# Patient Record
Sex: Female | Born: 1935 | Race: White | Hispanic: No | Smoking: Never smoker
Health system: Southern US, Community
[De-identification: ages and names within clinical notes are randomized; demographics above are authoritative.]

## PROBLEM LIST (undated history)

## (undated) DIAGNOSIS — M542 Cervicalgia: Secondary | ICD-10-CM

## (undated) DIAGNOSIS — G459 Transient cerebral ischemic attack, unspecified: Secondary | ICD-10-CM

## (undated) DIAGNOSIS — Z9889 Other specified postprocedural states: Secondary | ICD-10-CM

## (undated) DIAGNOSIS — I674 Hypertensive encephalopathy: Secondary | ICD-10-CM

## (undated) DIAGNOSIS — M1990 Unspecified osteoarthritis, unspecified site: Secondary | ICD-10-CM

## (undated) DIAGNOSIS — I609 Nontraumatic subarachnoid hemorrhage, unspecified: Secondary | ICD-10-CM

## (undated) DIAGNOSIS — I219 Acute myocardial infarction, unspecified: Secondary | ICD-10-CM

## (undated) DIAGNOSIS — I639 Cerebral infarction, unspecified: Secondary | ICD-10-CM

## (undated) DIAGNOSIS — K219 Gastro-esophageal reflux disease without esophagitis: Secondary | ICD-10-CM

## (undated) DIAGNOSIS — R269 Unspecified abnormalities of gait and mobility: Secondary | ICD-10-CM

## (undated) DIAGNOSIS — G8929 Other chronic pain: Secondary | ICD-10-CM

## (undated) DIAGNOSIS — F419 Anxiety disorder, unspecified: Secondary | ICD-10-CM

## (undated) DIAGNOSIS — Z9289 Personal history of other medical treatment: Secondary | ICD-10-CM

## (undated) DIAGNOSIS — M8580 Other specified disorders of bone density and structure, unspecified site: Secondary | ICD-10-CM

## (undated) DIAGNOSIS — E785 Hyperlipidemia, unspecified: Secondary | ICD-10-CM

## (undated) DIAGNOSIS — F329 Major depressive disorder, single episode, unspecified: Secondary | ICD-10-CM

## (undated) DIAGNOSIS — I5181 Takotsubo syndrome: Secondary | ICD-10-CM

## (undated) DIAGNOSIS — I38 Endocarditis, valve unspecified: Secondary | ICD-10-CM

## (undated) DIAGNOSIS — M5430 Sciatica, unspecified side: Secondary | ICD-10-CM

## (undated) DIAGNOSIS — I10 Essential (primary) hypertension: Secondary | ICD-10-CM

## (undated) HISTORY — DX: Takotsubo syndrome: I51.81

## (undated) HISTORY — DX: Hyperlipidemia, unspecified: E78.5

## (undated) HISTORY — PX: CARDIAC CATHETERIZATION: SHX172

## (undated) HISTORY — DX: Major depressive disorder, single episode, unspecified: F32.9

## (undated) HISTORY — PX: FRACTURE SURGERY: SHX138

## (undated) HISTORY — PX: APPENDECTOMY: SHX54

## (undated) HISTORY — DX: Gastro-esophageal reflux disease without esophagitis: K21.9

## (undated) HISTORY — DX: Other specified disorders of bone density and structure, unspecified site: M85.80

## (undated) HISTORY — DX: Essential (primary) hypertension: I10

## (undated) HISTORY — DX: Personal history of other medical treatment: Z92.89

## (undated) HISTORY — DX: Other specified postprocedural states: Z98.890

## (undated) HISTORY — PX: TONSILLECTOMY: SUR1361

## (undated) HISTORY — DX: Cerebral infarction, unspecified: I63.9

## (undated) HISTORY — DX: Hypertensive encephalopathy: I67.4

## (undated) HISTORY — DX: Nontraumatic subarachnoid hemorrhage, unspecified: I60.9

---

## 1975-01-20 HISTORY — PX: ABDOMINAL HYSTERECTOMY: SHX81

## 1998-01-19 HISTORY — PX: SPINAL FUSION: SHX223

## 1998-06-28 ENCOUNTER — Encounter: Payer: Self-pay | Admitting: Neurosurgery

## 1998-07-02 ENCOUNTER — Inpatient Hospital Stay (HOSPITAL_COMMUNITY): Admission: RE | Admit: 1998-07-02 | Discharge: 1998-07-03 | Payer: Self-pay | Admitting: Neurosurgery

## 1998-07-02 ENCOUNTER — Encounter: Payer: Self-pay | Admitting: Neurosurgery

## 1999-01-27 ENCOUNTER — Encounter: Admission: RE | Admit: 1999-01-27 | Discharge: 1999-01-27 | Payer: Self-pay | Admitting: Neurosurgery

## 1999-01-27 ENCOUNTER — Encounter: Payer: Self-pay | Admitting: Neurosurgery

## 1999-07-24 ENCOUNTER — Ambulatory Visit (HOSPITAL_COMMUNITY): Admission: RE | Admit: 1999-07-24 | Discharge: 1999-07-24 | Payer: Self-pay | Admitting: Neurosurgery

## 1999-07-24 ENCOUNTER — Encounter: Payer: Self-pay | Admitting: Neurosurgery

## 1999-12-18 ENCOUNTER — Other Ambulatory Visit: Admission: RE | Admit: 1999-12-18 | Discharge: 1999-12-18 | Payer: Self-pay | Admitting: Family Medicine

## 2000-01-07 ENCOUNTER — Encounter: Admission: RE | Admit: 2000-01-07 | Discharge: 2000-04-06 | Payer: Self-pay | Admitting: Internal Medicine

## 2002-09-07 ENCOUNTER — Encounter: Payer: Self-pay | Admitting: Internal Medicine

## 2002-09-07 ENCOUNTER — Ambulatory Visit (HOSPITAL_COMMUNITY): Admission: RE | Admit: 2002-09-07 | Discharge: 2002-09-07 | Payer: Self-pay | Admitting: Internal Medicine

## 2003-05-19 ENCOUNTER — Emergency Department (HOSPITAL_COMMUNITY): Admission: EM | Admit: 2003-05-19 | Discharge: 2003-05-19 | Payer: Self-pay | Admitting: Family Medicine

## 2004-03-07 ENCOUNTER — Ambulatory Visit: Payer: Self-pay | Admitting: Internal Medicine

## 2004-04-04 ENCOUNTER — Ambulatory Visit: Payer: Self-pay | Admitting: Internal Medicine

## 2004-05-30 ENCOUNTER — Ambulatory Visit: Payer: Self-pay | Admitting: Family Medicine

## 2004-07-29 ENCOUNTER — Ambulatory Visit: Payer: Self-pay | Admitting: Internal Medicine

## 2004-07-30 ENCOUNTER — Encounter: Admission: RE | Admit: 2004-07-30 | Discharge: 2004-07-30 | Payer: Self-pay | Admitting: Internal Medicine

## 2004-09-11 ENCOUNTER — Ambulatory Visit: Payer: Self-pay | Admitting: Internal Medicine

## 2004-10-20 ENCOUNTER — Ambulatory Visit: Payer: Self-pay | Admitting: Internal Medicine

## 2004-12-04 ENCOUNTER — Ambulatory Visit: Payer: Self-pay | Admitting: Internal Medicine

## 2005-02-16 ENCOUNTER — Ambulatory Visit: Payer: Self-pay | Admitting: Internal Medicine

## 2005-03-13 ENCOUNTER — Ambulatory Visit: Payer: Self-pay | Admitting: Internal Medicine

## 2005-04-08 ENCOUNTER — Ambulatory Visit: Payer: Self-pay | Admitting: Cardiology

## 2005-04-08 ENCOUNTER — Inpatient Hospital Stay (HOSPITAL_COMMUNITY): Admission: EM | Admit: 2005-04-08 | Discharge: 2005-04-12 | Payer: Self-pay | Admitting: Emergency Medicine

## 2005-04-22 ENCOUNTER — Ambulatory Visit: Payer: Self-pay | Admitting: Cardiology

## 2005-05-11 ENCOUNTER — Ambulatory Visit: Payer: Self-pay

## 2005-05-11 ENCOUNTER — Encounter: Payer: Self-pay | Admitting: Cardiology

## 2005-05-28 ENCOUNTER — Ambulatory Visit: Payer: Self-pay | Admitting: Cardiology

## 2005-06-17 ENCOUNTER — Ambulatory Visit: Payer: Self-pay | Admitting: Internal Medicine

## 2005-11-14 ENCOUNTER — Ambulatory Visit: Payer: Self-pay | Admitting: Internal Medicine

## 2005-11-17 ENCOUNTER — Ambulatory Visit: Payer: Self-pay | Admitting: Internal Medicine

## 2005-11-17 LAB — CONVERTED CEMR LAB
AST: 43 units/L — ABNORMAL HIGH (ref 0–37)
Hgb A1c MFr Bld: 10.2 % — ABNORMAL HIGH (ref 4.6–6.0)

## 2005-12-03 ENCOUNTER — Ambulatory Visit: Payer: Self-pay | Admitting: Cardiology

## 2005-12-29 ENCOUNTER — Ambulatory Visit: Payer: Self-pay | Admitting: Internal Medicine

## 2006-02-18 ENCOUNTER — Ambulatory Visit: Payer: Self-pay | Admitting: Internal Medicine

## 2006-02-18 LAB — CONVERTED CEMR LAB
AST: 22 units/L (ref 0–37)
Creatinine, Ser: 1 mg/dL (ref 0.4–1.2)
Direct LDL: 84.3 mg/dL
HDL: 37.4 mg/dL — ABNORMAL LOW (ref >39.0)
Hgb A1c MFr Bld: 7 % — ABNORMAL HIGH (ref 4.6–6.0)
Total CHOL/HDL Ratio: 4
Triglycerides: 206 mg/dL (ref 0–149)
VLDL: 41 mg/dL — ABNORMAL HIGH (ref 0–40)

## 2006-05-12 DIAGNOSIS — J309 Allergic rhinitis, unspecified: Secondary | ICD-10-CM

## 2006-05-12 DIAGNOSIS — M5430 Sciatica, unspecified side: Secondary | ICD-10-CM

## 2006-05-12 DIAGNOSIS — F329 Major depressive disorder, single episode, unspecified: Secondary | ICD-10-CM

## 2006-05-12 DIAGNOSIS — E785 Hyperlipidemia, unspecified: Secondary | ICD-10-CM

## 2006-05-12 DIAGNOSIS — I10 Essential (primary) hypertension: Secondary | ICD-10-CM

## 2006-05-12 DIAGNOSIS — K219 Gastro-esophageal reflux disease without esophagitis: Secondary | ICD-10-CM

## 2006-05-12 DIAGNOSIS — E1169 Type 2 diabetes mellitus with other specified complication: Secondary | ICD-10-CM | POA: Insufficient documentation

## 2006-05-12 DIAGNOSIS — E119 Type 2 diabetes mellitus without complications: Secondary | ICD-10-CM

## 2006-05-12 DIAGNOSIS — F3289 Other specified depressive episodes: Secondary | ICD-10-CM | POA: Insufficient documentation

## 2006-05-12 HISTORY — DX: Type 2 diabetes mellitus without complications: E11.9

## 2006-05-12 HISTORY — DX: Gastro-esophageal reflux disease without esophagitis: K21.9

## 2006-05-12 HISTORY — DX: Allergic rhinitis, unspecified: J30.9

## 2006-05-12 HISTORY — DX: Type 2 diabetes mellitus with other specified complication: E11.69

## 2006-05-12 HISTORY — DX: Essential (primary) hypertension: I10

## 2006-05-12 HISTORY — DX: Major depressive disorder, single episode, unspecified: F32.9

## 2006-05-12 HISTORY — DX: Other specified depressive episodes: F32.89

## 2006-05-15 ENCOUNTER — Emergency Department (HOSPITAL_COMMUNITY): Admission: EM | Admit: 2006-05-15 | Discharge: 2006-05-15 | Payer: Self-pay | Admitting: Emergency Medicine

## 2006-06-10 ENCOUNTER — Ambulatory Visit: Payer: Self-pay | Admitting: Cardiology

## 2006-06-23 ENCOUNTER — Ambulatory Visit: Payer: Self-pay | Admitting: Internal Medicine

## 2006-06-23 DIAGNOSIS — I219 Acute myocardial infarction, unspecified: Secondary | ICD-10-CM | POA: Insufficient documentation

## 2006-06-23 DIAGNOSIS — M479 Spondylosis, unspecified: Secondary | ICD-10-CM

## 2006-06-23 DIAGNOSIS — M810 Age-related osteoporosis without current pathological fracture: Secondary | ICD-10-CM

## 2006-06-23 HISTORY — DX: Age-related osteoporosis without current pathological fracture: M81.0

## 2006-06-23 HISTORY — DX: Spondylosis, unspecified: M47.9

## 2006-06-30 ENCOUNTER — Ambulatory Visit: Payer: Self-pay | Admitting: Internal Medicine

## 2006-07-05 ENCOUNTER — Telehealth (INDEPENDENT_AMBULATORY_CARE_PROVIDER_SITE_OTHER): Payer: Self-pay | Admitting: *Deleted

## 2006-07-05 LAB — CONVERTED CEMR LAB
Cholesterol: 157 mg/dL (ref 0–200)
HDL: 37.5 mg/dL — ABNORMAL LOW (ref >39.0)
Total CHOL/HDL Ratio: 4.2

## 2006-07-19 ENCOUNTER — Encounter (INDEPENDENT_AMBULATORY_CARE_PROVIDER_SITE_OTHER): Payer: Self-pay | Admitting: *Deleted

## 2006-09-01 ENCOUNTER — Ambulatory Visit: Payer: Self-pay | Admitting: Internal Medicine

## 2006-12-27 ENCOUNTER — Ambulatory Visit: Payer: Self-pay | Admitting: Internal Medicine

## 2007-01-03 LAB — CONVERTED CEMR LAB
Chloride: 102 meq/L (ref 96–112)
Creatinine, Ser: 1 mg/dL (ref 0.4–1.2)
Creatinine,U: 233.6 mg/dL
GFR calc Af Amer: 70 mL/min
Hgb A1c MFr Bld: 7.3 % — ABNORMAL HIGH (ref 4.6–6.0)
Microalb, Ur: 1.2 mg/dL (ref 0.0–1.9)
Sodium: 137 meq/L (ref 135–145)
TSH: 1.71 microintl units/mL (ref 0.35–5.50)

## 2007-04-13 ENCOUNTER — Encounter: Payer: Self-pay | Admitting: Internal Medicine

## 2007-04-25 ENCOUNTER — Encounter: Payer: Self-pay | Admitting: Family Medicine

## 2007-04-27 DIAGNOSIS — E1139 Type 2 diabetes mellitus with other diabetic ophthalmic complication: Secondary | ICD-10-CM

## 2007-04-27 HISTORY — DX: Type 2 diabetes mellitus with other diabetic ophthalmic complication: E11.39

## 2007-05-02 ENCOUNTER — Telehealth (INDEPENDENT_AMBULATORY_CARE_PROVIDER_SITE_OTHER): Payer: Self-pay | Admitting: *Deleted

## 2007-05-03 ENCOUNTER — Ambulatory Visit: Payer: Self-pay | Admitting: Internal Medicine

## 2007-05-04 ENCOUNTER — Encounter (INDEPENDENT_AMBULATORY_CARE_PROVIDER_SITE_OTHER): Payer: Self-pay | Admitting: *Deleted

## 2007-05-23 ENCOUNTER — Ambulatory Visit: Payer: Self-pay | Admitting: Gastroenterology

## 2007-05-27 ENCOUNTER — Ambulatory Visit: Payer: Self-pay | Admitting: Cardiology

## 2007-06-01 ENCOUNTER — Ambulatory Visit: Payer: Self-pay | Admitting: Gastroenterology

## 2007-06-28 ENCOUNTER — Telehealth (INDEPENDENT_AMBULATORY_CARE_PROVIDER_SITE_OTHER): Payer: Self-pay | Admitting: *Deleted

## 2007-07-08 ENCOUNTER — Emergency Department (HOSPITAL_COMMUNITY): Admission: EM | Admit: 2007-07-08 | Discharge: 2007-07-08 | Payer: Self-pay | Admitting: Family Medicine

## 2007-07-21 ENCOUNTER — Ambulatory Visit: Payer: Self-pay | Admitting: Internal Medicine

## 2007-07-27 ENCOUNTER — Telehealth (INDEPENDENT_AMBULATORY_CARE_PROVIDER_SITE_OTHER): Payer: Self-pay | Admitting: *Deleted

## 2007-07-27 LAB — CONVERTED CEMR LAB
BUN: 28 mg/dL — ABNORMAL HIGH (ref 6–23)
Chloride: 102 meq/L (ref 96–112)
Cholesterol: 164 mg/dL (ref 0–200)
Creatinine, Ser: 1 mg/dL (ref 0.4–1.2)
GFR calc non Af Amer: 58 mL/min
HDL: 42.4 mg/dL (ref >39.0)
LDL Cholesterol: 97 mg/dL (ref 0–99)
Potassium: 5.1 meq/L (ref 3.5–5.1)
Triglycerides: 121 mg/dL (ref 0–149)

## 2007-10-18 ENCOUNTER — Encounter (INDEPENDENT_AMBULATORY_CARE_PROVIDER_SITE_OTHER): Payer: Self-pay | Admitting: *Deleted

## 2008-01-05 ENCOUNTER — Ambulatory Visit: Payer: Self-pay | Admitting: Internal Medicine

## 2008-01-12 ENCOUNTER — Telehealth (INDEPENDENT_AMBULATORY_CARE_PROVIDER_SITE_OTHER): Payer: Self-pay | Admitting: *Deleted

## 2008-01-12 LAB — CONVERTED CEMR LAB
CO2: 25 meq/L (ref 19–32)
Calcium: 9.4 mg/dL (ref 8.4–10.5)
Chloride: 103 meq/L (ref 96–112)
Creatinine, Ser: 0.9 mg/dL (ref 0.4–1.2)
GFR calc Af Amer: 79 mL/min
Glucose, Bld: 110 mg/dL — ABNORMAL HIGH (ref 70–99)
Potassium: 4.6 meq/L (ref 3.5–5.1)
Sodium: 138 meq/L (ref 135–145)

## 2008-04-03 ENCOUNTER — Ambulatory Visit: Payer: Self-pay | Admitting: Internal Medicine

## 2008-04-03 DIAGNOSIS — M542 Cervicalgia: Secondary | ICD-10-CM

## 2008-04-03 HISTORY — DX: Cervicalgia: M54.2

## 2008-04-05 ENCOUNTER — Telehealth: Payer: Self-pay | Admitting: Internal Medicine

## 2008-04-12 ENCOUNTER — Encounter (INDEPENDENT_AMBULATORY_CARE_PROVIDER_SITE_OTHER): Payer: Self-pay | Admitting: *Deleted

## 2008-04-18 ENCOUNTER — Encounter: Admission: RE | Admit: 2008-04-18 | Discharge: 2008-04-18 | Payer: Self-pay | Admitting: Internal Medicine

## 2008-04-24 ENCOUNTER — Telehealth (INDEPENDENT_AMBULATORY_CARE_PROVIDER_SITE_OTHER): Payer: Self-pay | Admitting: *Deleted

## 2008-04-26 ENCOUNTER — Encounter: Payer: Self-pay | Admitting: Internal Medicine

## 2008-05-04 ENCOUNTER — Telehealth: Payer: Self-pay | Admitting: Internal Medicine

## 2008-05-17 ENCOUNTER — Ambulatory Visit: Payer: Self-pay | Admitting: Internal Medicine

## 2008-05-23 ENCOUNTER — Ambulatory Visit: Payer: Self-pay | Admitting: Cardiovascular Disease

## 2008-05-23 DIAGNOSIS — I38 Endocarditis, valve unspecified: Secondary | ICD-10-CM | POA: Insufficient documentation

## 2008-06-18 ENCOUNTER — Emergency Department (HOSPITAL_COMMUNITY): Admission: EM | Admit: 2008-06-18 | Discharge: 2008-06-18 | Payer: Self-pay | Admitting: General Surgery

## 2008-06-20 ENCOUNTER — Encounter (INDEPENDENT_AMBULATORY_CARE_PROVIDER_SITE_OTHER): Payer: Self-pay | Admitting: *Deleted

## 2008-07-04 ENCOUNTER — Encounter: Admission: RE | Admit: 2008-07-04 | Discharge: 2008-07-04 | Payer: Self-pay | Admitting: Family Medicine

## 2008-09-06 ENCOUNTER — Ambulatory Visit: Payer: Self-pay | Admitting: Internal Medicine

## 2008-09-12 LAB — CONVERTED CEMR LAB
BUN: 20 mg/dL (ref 6–23)
Basophils Absolute: 0 10*3/uL (ref 0.0–0.1)
Basophils Relative: 0.3 % (ref 0.0–3.0)
CO2: 28 meq/L (ref 19–32)
Calcium: 9.2 mg/dL (ref 8.4–10.5)
Chloride: 104 meq/L (ref 96–112)
Cholesterol: 159 mg/dL (ref 0–200)
Creatinine, Ser: 0.9 mg/dL (ref 0.4–1.2)
Eosinophils Relative: 2 % (ref 0.0–5.0)
Hemoglobin: 12.9 g/dL (ref 12.0–15.0)
Hgb A1c MFr Bld: 7.4 % — ABNORMAL HIGH (ref 4.6–6.5)
Lymphs Abs: 1.7 10*3/uL (ref 0.7–4.0)
Neutro Abs: 2.4 10*3/uL (ref 1.4–7.7)
Neutrophils Relative %: 51.7 % (ref 43.0–77.0)
Platelets: 284 10*3/uL (ref 150.0–400.0)
Potassium: 4.3 meq/L (ref 3.5–5.1)
RDW: 12 % (ref 11.5–14.6)
Sodium: 139 meq/L (ref 135–145)
Triglycerides: 253 mg/dL — ABNORMAL HIGH (ref 0.0–149.0)
WBC: 4.6 10*3/uL (ref 4.5–10.5)

## 2008-09-18 ENCOUNTER — Telehealth: Payer: Self-pay | Admitting: Internal Medicine

## 2008-10-18 ENCOUNTER — Encounter: Admission: RE | Admit: 2008-10-18 | Discharge: 2008-10-18 | Payer: Self-pay | Admitting: Internal Medicine

## 2008-10-18 ENCOUNTER — Encounter: Payer: Self-pay | Admitting: Internal Medicine

## 2008-12-10 ENCOUNTER — Ambulatory Visit: Payer: Self-pay | Admitting: Cardiovascular Disease

## 2008-12-12 ENCOUNTER — Telehealth (INDEPENDENT_AMBULATORY_CARE_PROVIDER_SITE_OTHER): Payer: Self-pay | Admitting: *Deleted

## 2008-12-19 ENCOUNTER — Telehealth (INDEPENDENT_AMBULATORY_CARE_PROVIDER_SITE_OTHER): Payer: Self-pay | Admitting: *Deleted

## 2008-12-19 ENCOUNTER — Ambulatory Visit: Payer: Self-pay | Admitting: Internal Medicine

## 2008-12-26 ENCOUNTER — Ambulatory Visit: Payer: Self-pay | Admitting: Internal Medicine

## 2008-12-28 LAB — CONVERTED CEMR LAB: AST: 21 units/L (ref 0–37)

## 2009-01-15 ENCOUNTER — Telehealth: Payer: Self-pay | Admitting: Internal Medicine

## 2009-01-19 HISTORY — PX: CATARACT EXTRACTION, BILATERAL: SHX1313

## 2009-01-22 ENCOUNTER — Telehealth: Payer: Self-pay | Admitting: Internal Medicine

## 2009-01-24 ENCOUNTER — Encounter: Admission: RE | Admit: 2009-01-24 | Discharge: 2009-04-03 | Payer: Self-pay | Admitting: Orthopedic Surgery

## 2009-03-01 ENCOUNTER — Encounter: Payer: Self-pay | Admitting: Internal Medicine

## 2009-04-02 ENCOUNTER — Encounter: Payer: Self-pay | Admitting: Internal Medicine

## 2009-04-03 ENCOUNTER — Encounter (INDEPENDENT_AMBULATORY_CARE_PROVIDER_SITE_OTHER): Payer: Self-pay | Admitting: *Deleted

## 2009-05-15 ENCOUNTER — Encounter: Payer: Self-pay | Admitting: Internal Medicine

## 2009-05-15 LAB — HM DIABETES EYE EXAM: HM Diabetic Eye Exam: NORMAL

## 2009-05-21 ENCOUNTER — Ambulatory Visit: Payer: Self-pay | Admitting: Cardiovascular Disease

## 2009-06-28 ENCOUNTER — Emergency Department (HOSPITAL_COMMUNITY): Admission: EM | Admit: 2009-06-28 | Discharge: 2009-06-28 | Payer: Self-pay | Admitting: Family Medicine

## 2009-06-28 ENCOUNTER — Emergency Department (HOSPITAL_COMMUNITY): Admission: EM | Admit: 2009-06-28 | Discharge: 2009-06-28 | Payer: Self-pay | Admitting: Emergency Medicine

## 2009-07-02 ENCOUNTER — Ambulatory Visit: Payer: Self-pay | Admitting: Internal Medicine

## 2009-07-03 DIAGNOSIS — F411 Generalized anxiety disorder: Secondary | ICD-10-CM

## 2009-07-03 HISTORY — DX: Generalized anxiety disorder: F41.1

## 2009-07-04 ENCOUNTER — Encounter: Payer: Self-pay | Admitting: Internal Medicine

## 2009-07-15 ENCOUNTER — Ambulatory Visit: Payer: Self-pay | Admitting: Internal Medicine

## 2009-07-17 LAB — CONVERTED CEMR LAB: Hgb A1c MFr Bld: 6.8 % — ABNORMAL HIGH (ref 4.6–6.5)

## 2009-08-02 ENCOUNTER — Encounter: Payer: Self-pay | Admitting: Internal Medicine

## 2009-08-07 ENCOUNTER — Ambulatory Visit: Payer: Self-pay | Admitting: Internal Medicine

## 2009-08-07 DIAGNOSIS — R269 Unspecified abnormalities of gait and mobility: Secondary | ICD-10-CM

## 2009-08-07 HISTORY — DX: Unspecified abnormalities of gait and mobility: R26.9

## 2009-10-22 ENCOUNTER — Ambulatory Visit: Payer: Self-pay | Admitting: Internal Medicine

## 2009-10-31 ENCOUNTER — Ambulatory Visit: Payer: Self-pay | Admitting: Internal Medicine

## 2009-10-31 LAB — CONVERTED CEMR LAB
Glucose, Urine, Semiquant: NEGATIVE
Protein, U semiquant: NEGATIVE

## 2009-11-19 ENCOUNTER — Ambulatory Visit: Payer: Self-pay | Admitting: Internal Medicine

## 2009-11-19 LAB — CONVERTED CEMR LAB
ALT: 19 units/L (ref 0–35)
AST: 22 units/L (ref 0–37)
BUN: 22 mg/dL (ref 6–23)
Basophils Absolute: 0 10*3/uL (ref 0.0–0.1)
Basophils Relative: 0.6 % (ref 0.0–3.0)
CO2: 28 meq/L (ref 19–32)
Calcium: 9.3 mg/dL (ref 8.4–10.5)
Cholesterol: 178 mg/dL (ref 0–200)
Eosinophils Absolute: 0.1 10*3/uL (ref 0.0–0.7)
GFR calc non Af Amer: 58.99 mL/min (ref >60)
HCT: 36.1 % (ref 36.0–46.0)
Hemoglobin: 12.6 g/dL (ref 12.0–15.0)
Lymphocytes Relative: 36 % (ref 12.0–46.0)
Lymphs Abs: 2.2 10*3/uL (ref 0.7–4.0)
MCV: 97 fL (ref 78.0–100.0)
Potassium: 5.5 meq/L — ABNORMAL HIGH (ref 3.5–5.1)
RBC: 3.72 M/uL — ABNORMAL LOW (ref 3.87–5.11)
RDW: 13.3 % (ref 11.5–14.6)
Total CHOL/HDL Ratio: 4
Triglycerides: 335 mg/dL — ABNORMAL HIGH (ref 0.0–149.0)
VLDL: 67 mg/dL — ABNORMAL HIGH (ref 0.0–40.0)

## 2009-11-20 ENCOUNTER — Telehealth: Payer: Self-pay | Admitting: Internal Medicine

## 2009-11-25 ENCOUNTER — Telehealth: Payer: Self-pay | Admitting: Internal Medicine

## 2010-02-08 ENCOUNTER — Encounter: Payer: Self-pay | Admitting: Internal Medicine

## 2010-02-18 NOTE — Consult Note (Signed)
Summary: endocrinology  [facility]   Imported By: Edmonia James 03/28/2009 09:49:45  _____________________________________________________________________  External Attachment:    Type:   Image     Comment:   External Document

## 2010-02-18 NOTE — Letter (Signed)
Summary: Appointment - Reminder [place], [place]  [address] [place]   [place], [place] [address]   Phone: [phone]  Fax: [phone]     April 03, 2009 MRN: [id number]   [facility] [address] [place], [place]  [address]   Dear Ms. Docken,  Our records indicate that it is time to schedule a follow-up appointment with Dr. Johnsie Cancel. It is very important that we reach you to schedule this appointment. We look forward to participating in your health care needs. Please contact us at the number listed above at your earliest convenience to schedule your appointment.  If you are unable to make an appointment at this time, give [place] a call so we can update our records.     Sincerely,   Darnell Level [facility] Scheduling Team

## 2010-02-18 NOTE — Assessment & Plan Note (Signed)
Summary: FOLLOW UP--PH   Vital Signs:  Patient profile:   77 year old female Height:      61 inches Weight:      147 pounds Temp:     97.9 degrees F oral Pulse rate:   72 / minute BP sitting:   120 / 70  (left arm)  Vitals Entered By: Rolla Flatten CMA (July 15, 2009 2:47 PM) CC: f/u Comments --not fasting REVIEWED MED LIST, PATIENT AGREED DOSE AND INSTRUCTION CORRECT    History of Present Illness:  followup She was seen 6/14 for a hospital followup, she was very anxious, she  lost  her sister. She was prescribed Xanax which she has taken rarely. It made her very sleepy. Overall her anxiety is okay and controlled.  Allergies: 1)  Actos (Pioglitazone Hcl)  Past History:  Past Medical History: Depression/anxiety Diabetes mellitus, type II Hyperlipidemia Hypertension Osteopenia Allergic rhinitis History of Takotsubo cardiomyopathy with subsequent normalization of left ventricular systolic function    Past Surgical History: Reviewed history from 04/03/2008 and no changes required. Hysterectomy Spinal fusion: C6-C7 fusion Dr Stern 2000, reevaluated 2004 by Dr Cay Schillings (had a myelogram , they rec surgery but pt did not do it )  Review of Systems       diet is okay He is due for a hemoglobin A1c She saw her eye doctor yesterday, she will have her cataracts removed soon   Physical Exam  General:  alert and well-developed.   Lungs:  normal respiratory effort, no intercostal retractions, no accessory muscle use, and normal breath sounds.   Heart:  normal rate, regular rhythm, and no murmur.   Extremities:  no pretibial edema bilaterally  Psych:  Oriented X3, good eye contact, and not anxious appearing.  still seems to be sad but better than the last office visit   Impression & Recommendations:  Problem # 1:  ANXIETY (ICD-300.00) Xanax makes her very sleepy I advised her to keep it and use it only if very  anxious we also discussed other options,  SSRIs? The patient feels that she does not need anything else,  states she is doing "ok" at this point Her updated medication list for this problem includes:    Alprazolam 0.5 Mg Tabs (Alprazolam) ..... Half tablet to one tablet every 6 hours as needed for anxiety  Problem # 2:  DIABETES MELLITUS, TYPE II (ICD-250.00) due for a hemoglobin A1c Her updated medication list for this problem includes:    Metformin Hcl 500 Mg Xr24h-tab (Metformin hcl) .Marland Kitchen... Take 2 tab  two times a day    Lisinopril 10 Mg Tabs (Lisinopril) .Marland Kitchen... 1 by mouth once daily    Aspirin 325 Mg Tabs (Aspirin) .Marland Kitchen... 1 tab once daily    Onglyza 5 Mg Tabs (Saxagliptin hcl) .Marland Kitchen... 1 tab by mouth once daily  Orders: Venipuncture [id number]) TLB-A1C / Hgb A1C (Glycohemoglobin) (83036-A1C)  Problem # 3:  face to face more than 15 minutes, more than 50% of the time counseling  Complete Medication List: 1)  Metformin Hcl 500 Mg Xr24h-tab (Metformin hcl) .... Take 2 tab  two times a day 2)  Bisoprolol Fumarate 5 Mg Tabs (Bisoprolol fumarate) .... Take 1  tablet by mouth once a day 3)  Lisinopril 10 Mg Tabs (Lisinopril) .Marland Kitchen.. 1 by mouth once daily 4)  Lovastatin 40 Mg Tabs (Lovastatin) .... 2 tabs by mouth once daily 5)  Prilosec Otc 20 Mg Tbec (Omeprazole magnesium) .... Take 1 tab once daily 6)  Aspirin 325 Mg Tabs (Aspirin) .Marland Kitchen.. 1 tab once daily 7)  Precision Xtra Blood Glucose Strp (Glucose blood) .... Checks bs 2x once daily  dx 250.00 8)  Onglyza 5 Mg Tabs (Saxagliptin hcl) .Marland Kitchen.. 1 tab by mouth once daily 9)  Alprazolam 0.5 Mg Tabs (Alprazolam) .... Half tablet to one tablet every 6 hours as needed for anxiety  Patient Instructions: 1)  Please schedule a follow-up appointment in 3 months .

## 2010-02-18 NOTE — Assessment & Plan Note (Signed)
Summary: pt fell and hit head/kn   Vital Signs:  Patient profile:   77 year old female Weight:      147.25 pounds Pulse rate:   73 / minute Pulse rhythm:   regular BP sitting:   130 / 80  (left arm) Cuff size:   regular  Vitals Entered By: [place] (August 07, 2009 11:52 AM) CC: Pt fell yesterday hit her head on a garbage can. Comments - Has a knot on her forehead. - Leg very sore.   History of Present Illness: the patient was watering her plants  in the yard and she lost her balance She fell on her knee and hands and hit her head  on a garbage can No bleeding pt is concerned because she noticed a bump in the forehead  ROS no  lost of consciousness before or after the fall No chest pain or palpitations No facial injury No blood noticed in the ears No neck pain today no HA perse, just sore at the site of impact No slurred speech or motor deficits reports a long history of imbalance x years also was recently seen by orthopedic surgery with right foot tendinitis. Pain is causing some difficulty walking  Current Medications (verified): 1)  Metformin Hcl 500 Mg Xr24h-Tab (Metformin Hcl) .... Take 2 Tab  Two Times A Day 2)  Bisoprolol Fumarate 5 Mg Tabs (Bisoprolol Fumarate) .... Take 1  Tablet By Mouth Once A Day 3)  Lisinopril 10 Mg Tabs (Lisinopril) .Marland Kitchen.. 1 By Mouth Once Daily 4)  Lovastatin 40 Mg Tabs (Lovastatin) .... 2 Tabs By Mouth Once Daily 5)  Prilosec Otc 20 Mg Tbec (Omeprazole Magnesium) .... Take 1 Tab Once Daily 6)  Aspirin 325 Mg Tabs (Aspirin) .Marland Kitchen.. 1 Tab Once Daily 7)  Precision Xtra Blood Glucose  Strp (Glucose Blood) .... Checks Bs 2x Once Daily  Dx 250.00 8)  Onglyza 5 Mg Tabs (Saxagliptin Hcl) .Marland Kitchen.. 1 Tab By Mouth Once Daily 9)  Alprazolam 0.5 Mg Tabs (Alprazolam) .... Half Tablet To One Tablet Every 6 Hours As Needed For Anxiety  Allergies: 1)  Actos (Pioglitazone Hcl)  Past History:  Past Medical History: Reviewed history from 07/15/2009 and  no changes required. Depression/anxiety Diabetes mellitus, type II Hyperlipidemia Hypertension Osteopenia Allergic rhinitis History of Takotsubo cardiomyopathy with subsequent normalization of left ventricular systolic function    Past Surgical History: Reviewed history from 04/03/2008 and no changes required. Hysterectomy Spinal fusion: C6-C7 fusion Dr Stern 2000, reevaluated 2004 by Dr Cay Schillings (had a myelogram , they rec surgery but pt did not do it )  Social History: Reviewed history from 12/26/2008 and no changes required. Single lives with her daughter.   two children diet-- working on portion control exercise -- no routine   Physical Exam  General:  alert, well-developed, and well-nourished.  alert, well-developed, and well-nourished.   Head:  face is symmetric, palpationof  the orbits show no pain or crepitus. She does have a 2x1 inch superficial hematoma in the mid forehead Ears:  R ear normal and L ear normal.   Neck:  full ROM.   Lungs:  normal respiratory effort, no intercostal retractions, no accessory muscle use, and normal breath sounds.   Heart:  normal rate, regular rhythm, and no murmur.   Extremities:  number lower extremity edema Neurologic:  EOMI speach fluent, clear motor symetric (slightly  less noticeable L naso-labial fold?)   Impression & Recommendations:  Problem # 1:  CONTUSION, HEAD (ICD-920) head  contusion without syncope or TIA type of symptoms Reassurance Her main problem seems to be   #2  Problem # 2:  GAIT DISTURBANCE (ICD-781.2) long history of imbalance Recommend to use a cane Refer to physical therapy--decline d/t cost  Complete Medication List: 1)  Metformin Hcl 500 Mg Xr24h-tab (Metformin hcl) .... Take 2 tab  two times a day 2)  Bisoprolol Fumarate 5 Mg Tabs (Bisoprolol fumarate) .... Take 1  tablet by mouth once a day 3)  Lisinopril 10 Mg Tabs (Lisinopril) .Marland Kitchen.. 1 by mouth once daily 4)  Lovastatin 40 Mg Tabs  (Lovastatin) .... 2 tabs by mouth once daily 5)  Prilosec Otc 20 Mg Tbec (Omeprazole magnesium) .... Take 1 tab once daily 6)  Aspirin 325 Mg Tabs (Aspirin) .Marland Kitchen.. 1 tab once daily 7)  Precision Xtra Blood Glucose Strp (Glucose blood) .... Checks bs 2x once daily  dx 250.00 8)  Onglyza 5 Mg Tabs (Saxagliptin hcl) .Marland Kitchen.. 1 tab by mouth once daily 9)  Alprazolam 0.5 Mg Tabs (Alprazolam) .... Half tablet to one tablet every 6 hours as needed for anxiety

## 2010-02-18 NOTE — Progress Notes (Signed)
Summary: calling to check on pt  Phone Note Outgoing Call Call back at [facility] Phone [phone]   Details for Reason: CALLING PT TO CHECK ON BLOOD SUGAR READINGS:  Summary of Call: no answer Sandra [name]  January 22, 2009 3:58 PM  Patient stopped taking Januvia on 12/28.  She is taking her metformin 1000 two times a day, glipizide 5 2 by mouth two times a day.  FBS this am was 154.  Patient states that since she stopped actos bs has not been controlled.  She does not want to do lantus.  She doesn't like needles.  (explained to pt that it was very small).  Patient would like to proceed with referral to endocrinologist.  Referral done [facility]  January 22, 2009 4:26 PM

## 2010-02-18 NOTE — Letter (Signed)
Summary: [facility]--negative DM exam, has cataracts  [facility]   Imported By: Edmonia James 08/07/2009 11:24:15  _____________________________________________________________________  External Attachment:    Type:   Image     Comment:   External Document

## 2010-02-18 NOTE — Assessment & Plan Note (Signed)
Summary: FOR BLADDER INFECTION//PH   Vital Signs:  Patient profile:   77 year old female Weight:      146.25 pounds Pulse rate:   69 / minute Pulse rhythm:   regular BP sitting:   130 / 82  (left arm) Cuff size:   regular  Vitals Entered By: [place] (October 31, 2009 10:07 AM) CC: Pt here for possible bladder infection? Comments Irritation burning when urinating since monday [facility]   History of Present Illness: as above  ROS No fever No nausea vomiting No gross hematuria No lower  abdominal pain No back pain  Allergies: 1)  Actos (Pioglitazone Hcl)  Past History:  Past Medical History: Reviewed history from 07/15/2009 and no changes required. Depression/anxiety Diabetes mellitus, type II Hyperlipidemia Hypertension Osteopenia Allergic rhinitis History of Takotsubo cardiomyopathy with subsequent normalization of left ventricular systolic function    Past Surgical History: Reviewed history from 04/03/2008 and no changes required. Hysterectomy Spinal fusion: C6-C7 fusion Dr Stern 2000, reevaluated 2004 by Dr Cay Schillings (had a myelogram , they rec surgery but pt did not do it )  Social History: Reviewed history from 12/26/2008 and no changes required. Single lives with her daughter.   two children diet-- working on portion control exercise -- no routine   Physical Exam  General:  alert and well-developed.   Abdomen:  soft, non-tender, normal bowel sounds, no distention, no masses, no guarding, and no rigidity.  no CVA tenderness Extremities:  no lower extremity edema   Impression & Recommendations:  Problem # 1:  UTI (ICD-599.0)  urine culture Start antibiotics  Her updated medication list for this problem includes:    Bactrim Ds 800-160 Mg Tabs (Sulfamethoxazole-trimethoprim) ..... One by mouth twice a day  Orders: Prescription Created Electronically [phone])  Complete Medication List: 1)  Metformin Hcl 500 Mg  Xr24h-tab (Metformin hcl) .... Take 2 tab  two times a day 2)  Bisoprolol Fumarate 5 Mg Tabs (Bisoprolol fumarate) .... Take 1  tablet by mouth once a day 3)  Lisinopril 10 Mg Tabs (Lisinopril) .Marland Kitchen.. 1 by mouth once daily 4)  Lovastatin 40 Mg Tabs (Lovastatin) .... 2 tabs by mouth once daily 5)  Prilosec Otc 20 Mg Tbec (Omeprazole magnesium) .... Take 1 tab once daily 6)  Aspirin 325 Mg Tabs (Aspirin) .Marland Kitchen.. 1 tab once daily 7)  Precision Xtra Blood Glucose Strp (Glucose blood) .... Checks bs 2x once daily  dx 250.00 8)  Onglyza 5 Mg Tabs (Saxagliptin hcl) .Marland Kitchen.. 1 tab by mouth once daily 9)  Alprazolam 0.5 Mg Tabs (Alprazolam) .... Half tablet to one tablet every 6 hours as needed for anxiety 10)  Bactrim Ds 800-160 Mg Tabs (Sulfamethoxazole-trimethoprim) .... One by mouth twice a day  Other Orders: UA Dipstick w/o Micro (automated)  (81003)  Patient Instructions: 1)  take antibiotics as prescribed 2)  Drink plenty of fluids 3)  Call if not better in a few days or if worse  Prescriptions: BACTRIM DS 800-160 MG TABS (SULFAMETHOXAZOLE-TRIMETHOPRIM) one by mouth twice a day  #10 x 0   Entered and Authorized by:   Alda Berthold. Katessa Attridge MD   Signed by:   Alda Berthold. Akaylah Lalley MD on 10/31/2009   Method used:   Electronically to        [facility]  [phone]* (retail)       [address]       [place], [place]  [address]  Ph: [phone] or [phone]       Fax: [phone]   [id number]   Laboratory Results   Urine Tests    Routine Urinalysis   Color: lt. yellow Appearance: Hazy Glucose: negative   (Normal Range: Negative) Bilirubin: negative   (Normal Range: Negative) Ketone: negative   (Normal Range: Negative) Spec. Gravity: 1.020   (Normal Range: 1.003-1.035) Blood: moderate   (Normal Range: Negative) pH: 6.0   (Normal Range: 5.0-8.0) Protein: negative   (Normal Range: Negative) Urobilinogen: 0.2   (Normal Range: 0-1) Nitrite: negative    (Normal Range: Negative) Leukocyte Esterace: small   (Normal Range: Negative)    Comments: Allyn Kenner CMA  October 31, 2009 10:10 AM

## 2010-02-18 NOTE — Assessment & Plan Note (Signed)
Summary: follow up ed/cbs   Vital Signs:  Patient profile:   77 year old female Height:      61 inches Weight:      145 pounds Temp:     98.0 degrees F Pulse rate:   66 / minute BP sitting:   140 / 76  (left arm)  Vitals Entered By: Rolla Flatten CMA (July 02, 2009 10:53 AM) CC: HOSP F/U Comments REVIEWED MED LIST, PATIENT AGREED DOSE AND INSTRUCTION CORRECT    History of Present Illness: was seen in the emergency room on 06-28-09 Chief complaint was change in mental status: She has some difficulty remembering numbers, she was under a lot of stress because her sister recently died  Pertinent labs: UA negative CBC normal except for hemoglobin of 11.7, LFTs normal, blood sugar 123, creatinine 0.9, potassium 4.2. Chest x-ray negative CT of the head showed small vessel disease and old lacunar infarcts EKG normal She was prescribed Ativan, did not fill the prescription because is afraid of the side effects   also was seen by cardiology 5-11, stable  Allergies: 1)  Actos (Pioglitazone Hcl)  Past History:  Past Medical History: Depression/anxiety Diabetes mellitus, type II Hyperlipidemia Hypertension Osteopenia Allergic rhinitis History of Takotsubo cardiomyopathy with subsequent normalization of left ventricular systolic function    Past Surgical History: Reviewed history from 04/03/2008 and no changes required. Hysterectomy Spinal fusion: C6-C7 fusion Dr Stern 2000, reevaluated 2004 by Dr Cay Schillings (had a myelogram , they rec surgery but pt did not do it )  Social History: Reviewed history from 12/26/2008 and no changes required. Single lives with her daughter.   two children diet-- working on portion control exercise -- no routine   Review of Systems       the patient reports that prior to go to the ER she was somehow  " disoriented, hesitant thinking" denies slurred speech, no focal deficits No nausea or vomiting She never stopped recognizing her  family She lost her sister recently, may 22, she is very upset about it. Frequent crying. Denies suicidal ideas, sleeping relatively well  Physical Exam  General:  alert and well-developed.   Lungs:  normal respiratory effort, no intercostal retractions, no accessory muscle use, and normal breath sounds.   Heart:  normal rate, regular rhythm, and no murmur.   Neurologic:  alert & oriented X3, cranial nerves II-XII intact, strength normal in all extremities, and gait normal.   Psych:  very emotional, crying but coherent and cooperative   Impression & Recommendations:  Problem # 1:  DEPRESSION (ICD-311) reactive depression to the loss of her daughter We talked about different options: SSRIs? Xanax? hopefully, she will grief and feel better in few weeks, she does not like to take anything permanent. We agreed to start Xanax to be used p.r.n. Aware off potential to make her drowsy. also offered a referral to see a counselor  as far as the confusion,workup in the ER was negative, neurological exam today is normal. Will recommend observation. Likely related to anxiety and depression  Her updated medication list for this problem includes:    Alprazolam 0.5 Mg Tabs (Alprazolam) ..... Half tablet to one tablet every 6 hours as needed for anxiety  Problem # 2:  ANXIETY (ICD-300.00) see #1 Her updated medication list for this problem includes:    Alprazolam 0.5 Mg Tabs (Alprazolam) ..... Half tablet to one tablet every 6 hours as needed for anxiety  Problem # 3:  time spent more than  25 minutes due to chart review and counseling.  Complete Medication List: 1)  Metformin Hcl 500 Mg Xr24h-tab (Metformin hcl) .... Take 2 tab  two times a day 2)  Bisoprolol Fumarate 5 Mg Tabs (Bisoprolol fumarate) .... Take 1  tablet by mouth once a day 3)  Lisinopril 10 Mg Tabs (Lisinopril) .Marland Kitchen.. 1 by mouth once daily 4)  Lovastatin 40 Mg Tabs (Lovastatin) .... 2 tabs by mouth once daily 5)  Prilosec Otc 20  Mg Tbec (Omeprazole magnesium) .... Take 1 tab once daily 6)  Aspirin 325 Mg Tabs (Aspirin) .Marland Kitchen.. 1 tab once daily 7)  Precision Xtra Blood Glucose Strp (Glucose blood) .... Checks bs 2x once daily  dx 250.00 8)  Onglyza 5 Mg Tabs (Saxagliptin hcl) .Marland Kitchen.. 1 tab by mouth once daily 9)  Alprazolam 0.5 Mg Tabs (Alprazolam) .... Half tablet to one tablet every 6 hours as needed for anxiety  Patient Instructions: 1)  Please schedule a follow-up appointment in 2 weeks.  Prescriptions: ALPRAZOLAM 0.5 MG TABS (ALPRAZOLAM) half tablet to one tablet every 6 hours as needed for anxiety  #30 x 0   Entered and Authorized by:   Alda Berthold. Paz MD   Signed by:   Alda Berthold. Paz MD on 07/02/2009   Method used:   Print then Give to Patient   RxID:   [id number]   Appended Document: follow up ed/cbs CORRECTION SEE PROBLEM #1 SHE DID NOT LOSS HER DAUGHTER BUT HER SISTER

## 2010-02-18 NOTE — Letter (Signed)
Summary: [facility]   Imported By: Edmonia James 04/20/2009 10:22:34  _____________________________________________________________________  External Attachment:    Type:   Image     Comment:   External Document

## 2010-02-18 NOTE — Letter (Signed)
Summary: [facility]   Imported By: Edmonia James 05/24/2009 11:59:07  _____________________________________________________________________  External Attachment:    Type:   Image     Comment:   External Document

## 2010-02-18 NOTE — Assessment & Plan Note (Signed)
Summary: CPX AND FASTING LABS///SP[facility]   Vital Signs:  Patient profile:   77 year old female Height:      61 inches Weight:      147.13 pounds Pulse rate:   82 / minute Pulse rhythm:   regular BP sitting:   134 / 86  (left arm) Cuff size:   regular  Vitals Entered By: Cannon (November 19, 2009 10:10 AM) CC: CPX, fasting Comments refill on alprazolam [facility] [address] not had mammo or pap- does not want pap   History of Present Illness: Here for Medicare AWV:  1.   Risk factors based on Past M, S, F history: reviewed 2.   Physical Activities: does yard work and house chores  3.   Depression/mood: admits to sadness , lost sister 69-11. denies suicide ideas  4.   Hearing: no problems noted w./ normal conversation  5.   ADL's: totally independent  6.   Fall Risk: moderate, had a fall 7-11 , counseled about  7.   Home Safety: does feel safe at home  8.   Height, weight, &visual acuity: see VS, s/p catract surgery B , good results, just needs                              reading glasses  9.   Counseling: see assessment and plan 10.   Labs ordered based on risk factors: yes 11.           Referral Coordination, if needed 12.           Care Plan, see assessment and plan 13.            Cognitive Assessment- memory seems normal, coordination appropriate, motor skills also                  appropriate for age  in addition, we discussed the following  Depression/anxiety see above  Diabetes -- ambulatory CBGs in the 130s , Dr Chalmers Cater  checked a A1c few weeks ago , it was  6.9  Hyperlipidemia-- good medication compliance w/ lovastatin  Hypertension-- ambulatory BPs varies , was high when she had a cold, now back to a more normal level       Preventive Screening-Counseling & Management  Caffeine-Diet-Exercise     Does Patient Exercise: no  Current Medications (verified): 1)  Metformin Hcl 500 Mg Xr24h-Tab (Metformin Hcl) .... Take 2 Tab  Two Times A Day 2)   Bisoprolol Fumarate 5 Mg Tabs (Bisoprolol Fumarate) .... Take 1  Tablet By Mouth Once A Day 3)  Lisinopril 10 Mg Tabs (Lisinopril) .Marland Kitchen.. 1 By Mouth Once Daily 4)  Lovastatin 40 Mg Tabs (Lovastatin) .... 2 Tabs By Mouth Once Daily 5)  Prilosec Otc 20 Mg Tbec (Omeprazole Magnesium) .... Take 1 Tab Once Daily 6)  Aspirin 325 Mg Tabs (Aspirin) .Marland Kitchen.. 1 Tab Once Daily 7)  Precision Xtra Blood Glucose  Strp (Glucose Blood) .... Checks Bs 2x Once Daily  Dx 250.00 8)  Onglyza 5 Mg Tabs (Saxagliptin Hcl) .Marland Kitchen.. 1 Tab By Mouth Once Daily 9)  Alprazolam 0.5 Mg Tabs (Alprazolam) .... Half Tablet To One Tablet Every 6 Hours As Needed For Anxiety  Allergies (verified): 1)  Actos (Pioglitazone Hcl)  Past History:  Past Medical History: Depression/anxiety GERD Diabetes mellitus, type II Hyperlipidemia Hypertension Osteopenia Allergic rhinitis History of Takotsubo cardiomyopathy with subsequent normalization of left ventricular systolic function  Past Surgical History: Hysterectomy 1977  NO oophorectomy  Spinal fusion: C6-C7 fusion Dr Stern 2000, reevaluated 2004 by Dr Cay Schillings (had a myelogram , they rec surgery but pt did not do it ) cataract surgery B (July- october 2011)  Family History: DM-- S x 2  CAD--no colon ca--no breast ca-- aunt?  Social History: Single lives with her daughter and G-daughter  two children diet-- working on portion control exercise -- no routine exercise but active tobacco-- never ETOH-- nothing recently  Does Patient Exercise:  no  Review of Systems CV:  Denies swelling of feet; occasionally pain under L distal ribs, last seconds, goes away w/ change on position . Resp:  Denies shortness of breath. GI:  Denies bloody stools, diarrhea, nausea, and vomiting; + heartburn, x a while, worse at night no odynophagia or dysphagia . GU:  Denies dysuria and hematuria.  Physical Exam  General:  alert and well-developed.   Neck:  no masses, no thyromegaly,  and normal carotid upstroke.   Breasts:  No mass, nodules, thickening, tenderness, bulging, retraction, inflamation, nipple discharge or skin changes noted.   no axillary lymphadenopathy  Lungs:  normal respiratory effort, no intercostal retractions, no accessory muscle use, and normal breath sounds.   Heart:  normal rate, regular rhythm, and no murmur.   Abdomen:  soft, non-tender, normal bowel sounds, no distention, no masses, no guarding, and no rigidity.    Extremities:  no lower extremity edema Psych:  Oriented X3, good eye contact, not anxious appearing, and not depressed appearing.   flat affect, at baseline   Impression & Recommendations:  Problem # 1:  HEALTH SCREENING (ICD-V70.0) Td 5010 pneumonia shot > 5 years ago and today  had a flu shot  colonoscopy 05/2007, showed hemorrhoids  female care has not seen gyn in years  last MMG  long ago , breast exam done today (normal) , will order a mammogram h/o abnormal PAP, hysterectomy 1977, had subsecuent PAPs, all normal not interested on more PAPs for now, will re asses on next CPX   counseling about diet, exercise and  fall  prevention  Orders: Radiology Referral (Radiology) Medicare -1st Annual Wellness Visit [phone])  Problem # 2:  HYPERTENSION (ICD-401.9) at goal  Her updated medication list for this problem includes:    Bisoprolol Fumarate 5 Mg Tabs (Bisoprolol fumarate) .Marland Kitchen... Take 1  tablet by mouth once a day    Lisinopril 10 Mg Tabs (Lisinopril) .Marland Kitchen... 1 by mouth once daily  BP today: 134/86 Prior BP: 130/82 (10/31/2009)  Labs Reviewed: K+: 4.3 (09/06/2008) Creat: : 0.9 (09/06/2008)   Chol: 159 (09/06/2008)   HDL: 39.40 (09/06/2008)   LDL: 97 (07/21/2007)   TG: 253.0 (09/06/2008)  Orders: TLB-BMP (Basic Metabolic Panel-BMET) (99991111) TLB-CBC Platelet - w/Differential (85025-CBCD)  Problem # 3:  OSTEOPOROSIS (ICD-733.00) last bone density test 08-2004, T score -2.4 plan-- Recommend calcium, vitamin D  over-the-counter Check vitamin D check bone density test  Orders: T-Vitamin D (25-Hydroxy) [id number]) Radiology Referral (Radiology)  Problem # 4:  ACID REFLUX DISEASE (ICD-530.81) not well controlled switch to Protonix 40 mg before dinner, on empty stomach Her updated medication list for this problem includes:    Protonix 40 Mg Tbec (Pantoprazole sodium) ..... One before dinner on an empty stomach  Problem # 5:  DEPRESSION (ICD-311) not well controlled recommend to see a counselor-- declined medication? declined except for as needed xanax  rec to call if symptoms worse or suicidal ideas recheck in 3 months  Her updated medication list for this problem includes:    Alprazolam 0.5 Mg Tabs (Alprazolam) ..... Half tablet to one tablet every 6 hours as needed for anxiety  Complete Medication List: 1)  Metformin Hcl 500 Mg Xr24h-tab (Metformin hcl) .... Take 2 tab  two times a day 2)  Bisoprolol Fumarate 5 Mg Tabs (Bisoprolol fumarate) .... Take 1  tablet by mouth once a day 3)  Lisinopril 10 Mg Tabs (Lisinopril) .Marland Kitchen.. 1 by mouth once daily 4)  Lovastatin 40 Mg Tabs (Lovastatin) .... 2 tabs by mouth once daily 5)  Protonix 40 Mg Tbec (Pantoprazole sodium) .... One before dinner on an empty stomach 6)  Aspirin 325 Mg Tabs (Aspirin) .Marland Kitchen.. 1 tab once daily 7)  Precision Xtra Blood Glucose Strp (Glucose blood) .... Checks bs 2x once daily  dx 250.00 8)  Onglyza 5 Mg Tabs (Saxagliptin hcl) .Marland Kitchen.. 1 tab by mouth once daily 9)  Alprazolam 0.5 Mg Tabs (Alprazolam) .... Half tablet to one tablet every 6 hours as needed for anxiety 10)  Calcium One or 2 G Daily, Vitamin D 600 Units Daily   Other Orders: Venipuncture [id number]) TLB-Lipid Panel (80061-LIPID) TLB-ALT (SGPT) (84460-ALT) TLB-AST (SGOT) (84450-SGOT) Pneumococcal Vaccine [id number]) Admin 1st Vaccine [id number])  Patient Instructions: 1)  change Prilosec to Protonix 2)  Please schedule a follow-up appointment in 3 months .    Prescriptions: PROTONIX 40 MG TBEC (PANTOPRAZOLE SODIUM) one before dinner on an empty stomach  #90 x 1   Entered and Authorized by:   Alda Berthold. Paz MD   Signed by:   Alda Berthold. Paz MD on 11/19/2009   Method used:   Electronically to        [facility]  [phone]* (retail)       [address]       [place], [place]  [address]       Ph: [phone] or [phone]       Fax: [phone]   RxID:   [id number]    Orders Added: 1)  Venipuncture [id number] 2)  TLB-BMP (Basic Metabolic Panel-BMET) [id number] 3)  TLB-CBC Platelet - w/Differential [85025-CBCD] 4)  TLB-Lipid Panel [80061-LIPID] 5)  T-Vitamin D (25-Hydroxy) [id number] 6)  TLB-ALT (SGPT) [84460-ALT] 7)  TLB-AST (SGOT) [84450-SGOT] 8)  Pneumococcal Vaccine [90732] 9)  Admin 1st Vaccine [id number] 10)  Radiology Referral [Radiology] 11)  Est. Patient Level III [id number] 12)  Medicare -1st Annual Wellness Visit [id number] 13)  Radiology Referral [Radiology]   Immunizations Administered:  Pneumonia Vaccine:    Vaccine Type: Pneumovax    Site: left deltoid    Mfr: Merck    Dose: 0.5 ml    Route: IM    Given by: Allyn Kenner CMA    Exp. Date: 04/07/2011    Lot #: [id number]   Immunizations Administered:  Pneumonia Vaccine:    Vaccine Type: Pneumovax    Site: left deltoid    Mfr: Merck    Dose: 0.5 ml    Route: IM    Given by: Allyn Kenner CMA    Exp. Date: 04/07/2011    Lot #: [id number]   Risk Factors:  Alcohol use:  no Exercise:  no
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
## 2010-02-18 NOTE — Progress Notes (Signed)
Summary: Vitamin D rx  Phone Note Call from Patient   Summary of Call: Patient called for med refill and left message on triage. The message was very confusing, but the patient mentions [facility] but asks that prescription be sent to [facility] on [address].   I called to discuss and the lady who answered notified me that the patient was not there, could not tell me when she would be, and didnt have time to discuss b/c she needs to pick up her child. I will wait for the patient to call back to office. Initial call taken by: Ernestene Mention CMA,  November 25, 2009 3:18 PM  Follow-up for Phone Call        No return call from patient, so I will try again. Spoke with patient and [facility] will not ship Vitamin D, it is not a part of the mail order with her plan. Pt is aware we will send rx to  [facility] on [address] per her request. Ernestene Mention CMA  November 27, 2009 4:55 PM     Prescriptions: VITAMIN D (ERGOCALCIFEROL) 50000 UNIT CAPS (ERGOCALCIFEROL) 1 by mouth weekly.  #12 x 0   Entered by:   Ernestene Mention CMA   Authorized by:   Alda Berthold. Paz MD   Signed by:   Ernestene Mention CMA on 11/27/2009   Method used:   Electronically to        [facility]  [phone]* (retail)       [address]       [place], [place]  [address]       Ph: [phone] or [phone]       Fax: [phone]   RxID:   [id number]

## 2010-02-18 NOTE — Letter (Signed)
Summary: Surgical Clearance/[facility]  Surgical Clearance/[facility]   Imported By: Edmonia James 08/08/2009 13:00:36  _____________________________________________________________________  External Attachment:    Type:   Image     Comment:   External Document

## 2010-02-18 NOTE — Assessment & Plan Note (Signed)
Summary: FLU SHOT///[facility]  Nurse Visit  CC: Flu shot./kb   Allergies: 1)  Actos (Pioglitazone Hcl)  Orders Added: 1)  Flu Vaccine 19yrs + MEDICARE PATIENTS [Q2039] 2)  Administration Flu vaccine - MCR [id number]            Flu Vaccine Consent Questions     Do you have a history of severe allergic reactions to this vaccine? no    Any prior history of allergic reactions to egg and/or gelatin? no    Do you have a sensitivity to the preservative Thimersol? no    Do you have a past history of Guillan-Barre Syndrome? no    Do you currently have an acute febrile illness? no    Have you ever had a severe reaction to latex? no    Vaccine information given and explained to patient? yes    Are you currently pregnant? no    Lot Number:[id number]   Exp Date:07/19/2010   Site Given  Right Deltoid IM1

## 2010-02-18 NOTE — Miscellaneous (Signed)
Summary: neg DM eye exam  Clinical Lists Changes  Observations: Added new observation of DMEYEEXAMNXT: 05/2010 (05/15/2009 8:33) Added new observation of DMEYEEXMRES: normal (05/15/2009 8:33) Added new observation of EYE EXAM BY: [facility] (05/15/2009 8:33) Added new observation of DIAB EYE EX: normal (05/15/2009 8:33)       Diabetes Management Exam:    Eye Exam:       Eye Exam done elsewhere          Date: 05/15/2009          Results: normal          Done by: Herbert Deaner

## 2010-02-18 NOTE — Letter (Signed)
Summary: Surgical Clearance/[facility]  Surgical Clearance/[facility]   Imported By: Edmonia James 08/07/2009 11:25:29  _____________________________________________________________________  External Attachment:    Type:   Image     Comment:   External Document  Appended Document: Surgical Clearance/[facility] advise ophthalmology, she is cleared for cataract surgery  Appended Document: Surgical Clearance/[facility] Spoke with them, they requested we fax over something stating that pt is cleared for surgery. I faxed them the above append.

## 2010-02-18 NOTE — Progress Notes (Signed)
Summary: results  Phone Note Outgoing Call   Summary of Call: --her vitamin D is low, call in   ergocalciferol 50,000 units one tablet weekly for 3 months, then continue with vitamin D 1200 units daily (over-the-counter) --potassium is slightly elevated, send a low potassium diet --her triglycerides are again elevated, in addition to lovastatin, I recommend to start tricor 48mg  1 by mouth once daily  --back in 3 weeks only for a K+ check (dx HTN) Jose E. Paz MD  November 20, 2009 4:45 PM    Follow-up for Phone Call        unable to reach pt, no voicemail. Follow-up by: Allyn Kenner CMA,  November 21, 2009 8:44 AM  Additional Follow-up for Phone Call Additional follow up Details #1::        Spoke with pt gave her instructions, meds called in. Additional Follow-up by: Allyn Kenner CMA,  November 21, 2009 3:55 PM    New/Updated Medications: TRICOR 48 MG TABS (FENOFIBRATE) 1 by mouth daily. VITAMIN D (ERGOCALCIFEROL) 50000 UNIT CAPS (ERGOCALCIFEROL) 1 by mouth weekly. Prescriptions: VITAMIN D (ERGOCALCIFEROL) 50000 UNIT CAPS (ERGOCALCIFEROL) 1 by mouth weekly.  #12 x 0   Entered by:   Allyn Kenner CMA   Authorized by:   Alda Berthold. Paz MD   Signed by:   Allyn Kenner CMA on 11/21/2009   Method used:   Faxed to ...       [facility] (mail-order)             , [place]         Ph: [phone]       Fax: [phone]   RxID:   [id number] TRICOR 48 MG TABS (FENOFIBRATE) 1 by mouth daily.  #90 x 0   Entered by:   Allyn Kenner CMA   Authorized by:   Alda Berthold. Paz MD   Signed by:   Allyn Kenner CMA on 11/21/2009   Method used:   Faxed to ...       [facility] (mail-order)             , [place]         Ph: [phone]       Fax: [phone]   RxID:   [id number]

## 2010-04-07 LAB — BASIC METABOLIC PANEL
BUN: 16 mg/dL (ref 6–23)
Calcium: 8.9 mg/dL (ref 8.4–10.5)
GFR calc Af Amer: >60 mL/min (ref >60)
GFR calc non Af Amer: 57 mL/min — ABNORMAL LOW (ref >60)
Glucose, Bld: 123 mg/dL — ABNORMAL HIGH (ref 70–99)
Potassium: 4.2 mEq/L (ref 3.5–5.1)
Sodium: 141 mEq/L (ref 135–145)

## 2010-04-07 LAB — URINALYSIS, ROUTINE W REFLEX MICROSCOPIC
Glucose, UA: NEGATIVE mg/dL
Ketones, ur: NEGATIVE mg/dL
Nitrite: NEGATIVE
Specific Gravity, Urine: 1.004 — ABNORMAL LOW (ref 1.005–1.030)
Urobilinogen, UA: 0.2 mg/dL (ref 0.0–1.0)
pH: 7 (ref 5.0–8.0)

## 2010-04-07 LAB — T4, FREE: Free T4: 1.18 ng/dL (ref 0.80–1.80)

## 2010-04-07 LAB — HEPATIC FUNCTION PANEL
AST: 18 U/L (ref 0–37)
Alkaline Phosphatase: 57 U/L (ref 39–117)
Bilirubin, Direct: <0.1 mg/dL (ref 0.0–0.3)
Total Bilirubin: 0.7 mg/dL (ref 0.3–1.2)
Total Protein: 6.3 g/dL (ref 6.0–8.3)

## 2010-04-07 LAB — TSH: TSH: 1.28 u[IU]/mL (ref 0.350–4.500)

## 2010-04-07 LAB — GLUCOSE, CAPILLARY
Glucose-Capillary: 133 mg/dL — ABNORMAL HIGH (ref 70–99)
Glucose-Capillary: 153 mg/dL — ABNORMAL HIGH (ref 70–99)

## 2010-04-07 LAB — CBC
Hemoglobin: 11.7 g/dL — ABNORMAL LOW (ref 12.0–15.0)
RDW: 12.5 % (ref 11.5–15.5)
WBC: 5.2 10*3/uL (ref 4.0–10.5)

## 2010-04-07 LAB — DIFFERENTIAL
Basophils Absolute: 0 10*3/uL (ref 0.0–0.1)
Basophils Relative: 0 % (ref 0–1)
Eosinophils Absolute: 0 10*3/uL (ref 0.0–0.7)
Eosinophils Relative: 1 % (ref 0–5)
Lymphs Abs: 1.7 10*3/uL (ref 0.7–4.0)
Monocytes Absolute: 0.4 10*3/uL (ref 0.1–1.0)
Monocytes Relative: 8 % (ref 3–12)

## 2010-04-14 ENCOUNTER — Telehealth: Payer: Self-pay | Admitting: Internal Medicine

## 2010-04-14 NOTE — Telephone Encounter (Signed)
Patient had breast exam in nov - she said breast hurt at exam - she was referred for mammogram & bone density but didn't go because she wanted to go to [facility] radiology - patient is having drainage - refused appt

## 2010-04-14 NOTE — Telephone Encounter (Signed)
Spoke w/ pt aware appointment is needed scheduled to see Dr. Birdie Riddle tomorrow.

## 2010-04-15 ENCOUNTER — Encounter: Payer: Self-pay | Admitting: Family Medicine

## 2010-04-15 ENCOUNTER — Ambulatory Visit (INDEPENDENT_AMBULATORY_CARE_PROVIDER_SITE_OTHER): Payer: Medicare Other | Admitting: Family Medicine

## 2010-04-15 VITALS — BP 130/74 | Temp 96.7°F | Wt 146.2 lb

## 2010-04-15 DIAGNOSIS — N644 Mastodynia: Secondary | ICD-10-CM

## 2010-04-15 NOTE — Patient Instructions (Signed)
We will call you with your mammogram appt Tylenol as needed for breast pain Wear a good, supportive bra Warm compresses to breasts as needed for pain relief Call with any questions or concerns Hang in there!

## 2010-04-15 NOTE — Progress Notes (Signed)
  Subjective:    Patient ID: OSHEA MAR, female    DOB: 26-Mar-1933, 77 y.o.   MRN: [id number]  HPI Breast pain/discharge- developed pain in R breast after exam in October.  Pain started intermittantly and progressed to a constant pain located underneath the R breast.  Noticed scant, crusted yellow discharge on nipples bilaterally this weekend.  No hx of similar.  No discharge since the weekend- no dripping from breasts or staining of bra cups.  Not UTD on mammogram.  Had mammo scheduled at the [facility] but since her records were at [facility] they told her to go there.  [facility] told her they didn't have an order.   Review of Systems For ROS see HPI     Objective:   Physical Exam  Constitutional: She appears well-developed and well-nourished. No distress.  Pulmonary/Chest: Right breast exhibits inverted nipple, mass and tenderness. Right breast exhibits no nipple discharge. Left breast exhibits inverted nipple and tenderness. Left breast exhibits no mass and no nipple discharge.            Assessment & Plan:

## 2010-04-15 NOTE — Assessment & Plan Note (Signed)
Pt w/ ? Cystic mass in R breast at ~4 o'clock position, + TTP along area in question and most of inferior R breast.  Also tender along L inferior breast but much less than R.  No discharge seen or able to be expressed.  Refer for Diagnostic Mammo.  Pt expressed understanding and is in agreement w/ plan.

## 2010-04-21 ENCOUNTER — Telehealth: Payer: Self-pay | Admitting: Family Medicine

## 2010-04-21 NOTE — Telephone Encounter (Signed)
Patient called to check on status of referral about a diagnostic mammogram---says she saw Dr Birdie Riddle last Tuesday 3/27 and is concerned that she has not been called back yet---please call her today  (she will not be home between 1:45 and 3:30 or 4:00 because of another doctor appointment

## 2010-04-21 NOTE — Telephone Encounter (Signed)
Patient is scheduled for her appt on 04-22-2010 at 4pm with [facility] which has merged to [facility], I made patient aware by phone/ rh 04-21-10

## 2010-05-15 ENCOUNTER — Encounter: Payer: Self-pay | Admitting: Family Medicine

## 2010-06-03 NOTE — Assessment & Plan Note (Signed)
[facility] OFFICE NOTE   [name], Sandra [name]                      MRN:          [id number]  DATE:06/10/2006                            DOB:          1933-10-25    PRIMARY CARE PHYSICIAN:  Dr. Kathlene November.   REASON FOR VISIT:  Cardiac followup.   HISTORY OF PRESENT ILLNESS:  I saw Sandra [name] back in November.  She has  a history of Tako-Tsubo cardiomyopathy with subsequent improvement in  overall left ventricular systolic function to the normal range on  medical therapy.  She reports being symptomatically stable.  She has had  no progressive dyspnea on exertion or chest pain.  She reports being  very active in her usual daily living.  She has a trip planned to [place] with her daughter and grand daughter in the next few weeks.  I  reviewed her medications today.  She does state that she is still  undergoing some titration of her diabetic regimen, and is due back to  see Dr. Larose Kells.  Her electrocardiogram shows decreased voltage in the  precordial leads, nonspecific ST-T wave changes, and Q wave in lead III  alone.  Blood pressure is better controlled today.  She has had no  palpitations or syncope.   ALLERGIES:  No known drug allergies.   PRESENT MEDICATIONS:  1. Lisinopril 5 mg p.o. daily.  2. Bisoprolol 2.5 mg p.o. daily.  3. Aspirin 325 mg p.o. daily.  4. Metformin 1000 mg p.o. daily.  5. Lovastatin 80 mg p.o. daily.  6. Glipizide 2.5 mg p.o. daily.   REVIEW OF SYSTEMS:  As described in the history of present illness.   EXAMINATION:  Blood pressure is 132/74, heart rate is 78, weight is 164  pounds.  The patient is comfortable and in no acute distress.  NECK:  Examination reveals no elevated jugular venous pressure or loud  bruits.  No thyromegaly is noted.  LUNGS:  Clear without labored breathing at rest.  CARDIAC:  Regular rate and rhythm without rub, murmur, or gallop.  EXTREMITIES:  No pitting  edema.   IMPRESSION/RECOMMENDATIONS:  1. History of Tako-Tsubo cardiomyopathy with subsequent improvement to      overall normal systolic function on medical therapy.  She has not      had any progressive or new symptoms.  Will plan to continue present      medical course.  I will see her back in 1 year's time.  In the      interim, she will continue to follow with Dr. Larose Kells.  2. Further plan is to follow.     Satira Sark, MD  Electronically Signed    SGM/MedQ  DD: 06/10/2006  DT: 06/10/2006  Job #: [id number]   cc:   Kathlene November, MD

## 2010-06-03 NOTE — Assessment & Plan Note (Signed)
[facility] OFFICE NOTE   AUDELL, WYMAN                      MRN:          [id number]  DATE:05/27/2007                            DOB:          07-13-1933    REFERRING PHYSICIAN:  Kathlene November, MD   PRIMARY CARE PHYSICIAN:  Dr. Kathlene November.   REASON FOR VISIT:  Annual cardiac followup.   HISTORY OF PRESENT ILLNESS:  Sandra [name] returns for routine visit.  She  has a previous documented history of Takotsubo cardiomyopathy with  subsequent improvement to normal ventricular systolic function and  stable symptomatology.  She presently has NYHA class I to II dyspnea on  exertion and no chest pain.  She has developed no new problems since I  last saw her from a cardiac perspective.  Her medications are outlined  below.  Her electrocardiogram is stable, showing sinus rhythm at 65  beats per minute.  She has nonspecific T-wave flattening and otherwise  no new changes.  Today we talked about the importance of a regular  exercise regimen.  She is also following her lipids and diabetes  management with Dr. Larose Kells.   ALLERGIES:  NO KNOWN DRUG ALLERGIES.   MEDICATIONS:  Aspirin 325 mg p.o. daily, bisoprolol 2.5 mg p.o. daily,  lisinopril 10 mg p.o. daily, metformin 1000 mg in the a.m. and 500 mg in  the p.m., lovastatin 80 mg p.o. q.h.s., glipizide XL 2.5 mg 2 tablets in  the morning and 1 tablet in the evening, and Prilosec 20 mg p.o. daily.   REVIEW OF SYSTEMS:  As described in the history of present illness,  otherwise negative.   EXAMINATION:  Blood pressure is 155/70, heart rate is 65.  Weight is 163  pounds, which is stable.  The patient is comfortable, in no acute  distress.  Examination of the neck reveals no elevated jugular venous pressure, no  loud bruits.  LUNGS:  Clear without labored breathing.  CARDIAC EXAM:  Regular rate and rhythm.  No S3 gallop or pericardial  rub.  ABDOMEN:  Soft, nontender.  EXTREMITIES:  Exhibit no frank pitting edema.  Distal distal pulses are 2+.   IMPRESSION AND RECOMMENDATIONS:  History of Takotsubo cardiomyopathy  with subsequent normalization of left ventricular systolic function.  The patient is stable symptomatically.  I would recommend continued  medical therapy, regular exercise and followup with diabetes and  hyperlipidemia with Dr. Larose Kells.  Her blood pressure is elevated today  although typically has been better controlled.  She does have room for  further up titration of her  angiotensin-converting enzyme inhibitor if necessary.  We will plan to  see her back in 1 year's time.     Satira Sark, MD  Electronically Signed    SGM/MedQ  DD: 05/27/2007  DT: 05/27/2007  Job #: [id number]   cc:   Kathlene November, MD
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
## 2010-06-06 NOTE — Discharge Summary (Signed)
Sandra [name], Sandra [name]               ACCOUNT NO.:  [id number]   MEDICAL RECORD NO.:  [id number]          PATIENT TYPE:  INP   LOCATION:  4703                         FACILITY:  [facility]   PHYSICIAN:  Signa Kell, M.D.DATE OF BIRTH:  1933/02/08   DATE OF ADMISSION:  04/08/2005  DATE OF DISCHARGE:  04/12/2005                                 DISCHARGE SUMMARY   ADDENDUM:  PA and physician time greater than 30 minutes on this discharge.      Richardson Dopp, P.A.    ______________________________  E. Raymond Gurney, M.D.    SW/MEDQ  D:  04/12/2005  T:  04/14/2005  Job:  [id number]

## 2010-06-06 NOTE — Assessment & Plan Note (Signed)
[facility] OFFICE NOTE   CARAN, MILLSON                      MRN:          [id number]  DATE:12/03/2005                            DOB:          26-Mar-1933    PRIMARY CARE PHYSICIAN:  Kathlene November, M.D.   REASON FOR VISIT:  Cardiac follow-up.   HISTORY OF PRESENT ILLNESS:  I saw Ms. Koffel back in May.  Her history is  detailed in my previous note.  She does not report any significant chest  pain or limiting dyspnea at this time.  She states that she has had some  trouble in the last few months regulating her glucose and has been working  with Dr. Larose Kells on this.  She has not been walking regularly or pursuing any  regular exercise regimen.  She ran out of bisoprolol approximately 2 weeks  ago.  Today's electrocardiogram shows sinus rhythm at 76 beats per minute  with nonspecific T wave changes.  Today I spoke to her about continued  efforts at risk factor modification.  I reviewed the importance of a regular  exercise regiment as well.   ALLERGIES:  No known drug allergies.   PRESENT MEDICATIONS:  1. Metformin 500 mg p.o. b.i.d.  2. Glucotrol 2.5 mg p.o. b.i.d.  3. Aspirin 325 mg p.o. daily.  4. Actonel 35 mg q.week.  5. Lovastatin 20 mg p.o. daily.  6. Lisinopril 5 mg p.o. daily.   REVIEW OF SYSTEMS:  As in history of present illness.   PHYSICAL EXAMINATION:  VITAL SIGNS:  Blood pressure 144/78, rechecked by me  at 138/78, heart rate 76, weight 165 pounds.  GENERAL:  The patient is comfortable and in no acute distress.  HEENT:  Conjunctivae and lids normal.  Oropharynx is clear.  NECK:  Supple without elevated jugular venous pressure.  LUNGS:  Clear without labored breathing.  HEART:  Regular rate and rhythm without murmurs, rubs, or gallops.  ABDOMEN:  Soft, no bruit.  Bowel sounds present.  EXTREMITIES:  No significant pitting edema.   IMPRESSION:  1. History of Tako-Tsubo cardiomyopathy with  subsequent improvement in      overall left ventricular ejection fraction to the normal range.  We      will plan general medical therapy and recommend a strategy of risk      factor modification.  She will continue to follow up with Dr. Larose Kells      regularly and I will see her back for symptom review over the next 6      months.  I refilled her bisoprolol and discussed the importance of      regular medication use as well as initiating a basic walking regimen.      Her bllod pressure is elevated today, although has not been longterm.      She will continue follow up of her diabetes, cholesterol status, and      blood pressure with Dr. Larose Kells in the interim.  2. Further plans to follow.     Satira Sark, MD  Electronically Signed  SGM/MedQ  DD: 12/03/2005  DT: 12/03/2005  Job #: [id number]   cc:   Kathlene November, MD
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
## 2010-06-06 NOTE — Discharge Summary (Signed)
NAMEDURGA, MINO               ACCOUNT NO.:  [id number]   MEDICAL RECORD NO.:  [id number]          PATIENT TYPE:  INP   LOCATION:  [id number]                         FACILITY:  [facility]   PHYSICIAN:  Ernestine Mcmurray, M.D. LHCDATE OF BIRTH:  10/11/1933   DATE OF ADMISSION:  04/08/2005  DATE OF DISCHARGE:  04/12/2005                                 DISCHARGE SUMMARY   ADDENDUM:  Total physician and physician assistant time is greater than 30  minutes on this discharge.      Richardson Dopp, P.A.      Ernestine Mcmurray, M.D. [facility]  Electronically Signed    SW/MEDQ  D:  04/12/2005  T:  04/14/2005  Job:  [id number]
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
## 2010-06-25 ENCOUNTER — Ambulatory Visit (INDEPENDENT_AMBULATORY_CARE_PROVIDER_SITE_OTHER): Payer: Medicare Other | Admitting: Family Medicine

## 2010-06-25 VITALS — BP 140/76 | Temp 97.6°F | Wt 146.1 lb

## 2010-06-25 DIAGNOSIS — M549 Dorsalgia, unspecified: Secondary | ICD-10-CM

## 2010-06-25 LAB — POCT URINALYSIS DIPSTICK
Leukocytes, UA: NEGATIVE
Nitrite, UA: NEGATIVE
Protein, UA: NEGATIVE
Urobilinogen, UA: 0.2

## 2010-06-25 MED ORDER — CEPHALEXIN 500 MG PO CAPS: 500.0000 mg | ORAL_CAPSULE | Freq: Two times a day (BID) | ORAL | Status: DC

## 2010-06-25 NOTE — Patient Instructions (Signed)
Schedule your complete physical for after Nov 1 We'll notify you of your urine culture results Start the Keflex as directed for infection Drink plenty of fluids Call with any questions or concerns Have a great summer!

## 2010-06-25 NOTE — Progress Notes (Signed)
  Subjective:    Patient ID: SALVATORE SMAII, female    DOB: Feb 12, 1933, 77 y.o.   MRN: [id number]  HPI ? Bladder infxn- pt reports this feels similar to previous 2 infxns (last in Oct 2011).  sxs started 3 days ago.  Having pressure, external burning, frequency.  Feels she is emptying completely.  Somewhat poor fluid intake.  No fevers.  Denies vaginal d/c.   Review of Systems For ROS see HPI     Objective:   Physical Exam  Constitutional: She appears well-developed and well-nourished. No distress.  Abdominal: Soft. Bowel sounds are normal. She exhibits no distension. There is no tenderness (suprapubic or CVA).  Genitourinary:       Deferred at pt request          Assessment & Plan:

## 2010-06-27 LAB — URINE CULTURE: Colony Count: NO GROWTH

## 2010-07-01 DIAGNOSIS — M549 Dorsalgia, unspecified: Secondary | ICD-10-CM | POA: Insufficient documentation

## 2010-07-01 HISTORY — DX: Dorsalgia, unspecified: M54.9

## 2010-07-01 NOTE — Assessment & Plan Note (Signed)
Pt reports sxs are consistent w/ previous UTIs.  Send urine for cx.  Start abx.  Reviewed supportive care and red flags that should prompt return.  Pt expressed understanding and is in agreement w/ plan.

## 2010-11-27 ENCOUNTER — Emergency Department (HOSPITAL_COMMUNITY): Payer: Medicare Other

## 2010-11-27 ENCOUNTER — Ambulatory Visit (INDEPENDENT_AMBULATORY_CARE_PROVIDER_SITE_OTHER): Payer: Medicare Other | Admitting: Family Medicine

## 2010-11-27 ENCOUNTER — Ambulatory Visit: Payer: Medicare Other

## 2010-11-27 ENCOUNTER — Other Ambulatory Visit: Payer: Self-pay

## 2010-11-27 ENCOUNTER — Encounter (HOSPITAL_COMMUNITY): Payer: Self-pay

## 2010-11-27 ENCOUNTER — Encounter: Payer: Self-pay | Admitting: Family Medicine

## 2010-11-27 ENCOUNTER — Inpatient Hospital Stay (HOSPITAL_COMMUNITY)
Admission: EM | Admit: 2010-11-27 | Discharge: 2010-11-29 | DRG: 065 | Disposition: A | Discharge status: Home / Self Care | Payer: Medicare Other | Attending: Neurology | Admitting: Neurology

## 2010-11-27 DIAGNOSIS — I609 Nontraumatic subarachnoid hemorrhage, unspecified: Principal | ICD-10-CM | POA: Diagnosis present

## 2010-11-27 DIAGNOSIS — G40909 Epilepsy, unspecified, not intractable, without status epilepticus: Secondary | ICD-10-CM | POA: Diagnosis present

## 2010-11-27 DIAGNOSIS — I252 Old myocardial infarction: Secondary | ICD-10-CM

## 2010-11-27 DIAGNOSIS — R51 Headache: Secondary | ICD-10-CM

## 2010-11-27 DIAGNOSIS — R4701 Aphasia: Secondary | ICD-10-CM | POA: Diagnosis present

## 2010-11-27 DIAGNOSIS — M5430 Sciatica, unspecified side: Secondary | ICD-10-CM | POA: Diagnosis present

## 2010-11-27 DIAGNOSIS — Z79899 Other long term (current) drug therapy: Secondary | ICD-10-CM

## 2010-11-27 DIAGNOSIS — E1139 Type 2 diabetes mellitus with other diabetic ophthalmic complication: Secondary | ICD-10-CM | POA: Diagnosis present

## 2010-11-27 DIAGNOSIS — M810 Age-related osteoporosis without current pathological fracture: Secondary | ICD-10-CM | POA: Diagnosis present

## 2010-11-27 DIAGNOSIS — G40109 Localization-related (focal) (partial) symptomatic epilepsy and epileptic syndromes with simple partial seizures, not intractable, without status epilepticus: Secondary | ICD-10-CM

## 2010-11-27 DIAGNOSIS — I10 Essential (primary) hypertension: Secondary | ICD-10-CM | POA: Diagnosis present

## 2010-11-27 DIAGNOSIS — Z9849 Cataract extraction status, unspecified eye: Secondary | ICD-10-CM

## 2010-11-27 DIAGNOSIS — R079 Chest pain, unspecified: Secondary | ICD-10-CM | POA: Insufficient documentation

## 2010-11-27 DIAGNOSIS — I639 Cerebral infarction, unspecified: Secondary | ICD-10-CM

## 2010-11-27 DIAGNOSIS — E11319 Type 2 diabetes mellitus with unspecified diabetic retinopathy without macular edema: Secondary | ICD-10-CM | POA: Diagnosis present

## 2010-11-27 DIAGNOSIS — R279 Unspecified lack of coordination: Secondary | ICD-10-CM | POA: Diagnosis present

## 2010-11-27 DIAGNOSIS — R519 Headache, unspecified: Secondary | ICD-10-CM | POA: Diagnosis present

## 2010-11-27 DIAGNOSIS — M542 Cervicalgia: Secondary | ICD-10-CM | POA: Diagnosis present

## 2010-11-27 DIAGNOSIS — F329 Major depressive disorder, single episode, unspecified: Secondary | ICD-10-CM | POA: Diagnosis present

## 2010-11-27 DIAGNOSIS — R4781 Slurred speech: Secondary | ICD-10-CM | POA: Diagnosis present

## 2010-11-27 DIAGNOSIS — Z23 Encounter for immunization: Secondary | ICD-10-CM

## 2010-11-27 DIAGNOSIS — R269 Unspecified abnormalities of gait and mobility: Secondary | ICD-10-CM

## 2010-11-27 DIAGNOSIS — E785 Hyperlipidemia, unspecified: Secondary | ICD-10-CM | POA: Diagnosis present

## 2010-11-27 DIAGNOSIS — F3289 Other specified depressive episodes: Secondary | ICD-10-CM | POA: Diagnosis present

## 2010-11-27 DIAGNOSIS — Z981 Arthrodesis status: Secondary | ICD-10-CM

## 2010-11-27 DIAGNOSIS — S86009A Unspecified injury of unspecified Achilles tendon, initial encounter: Secondary | ICD-10-CM

## 2010-11-27 DIAGNOSIS — R4789 Other speech disturbances: Secondary | ICD-10-CM

## 2010-11-27 DIAGNOSIS — E119 Type 2 diabetes mellitus without complications: Secondary | ICD-10-CM | POA: Diagnosis present

## 2010-11-27 DIAGNOSIS — K219 Gastro-esophageal reflux disease without esophagitis: Secondary | ICD-10-CM | POA: Diagnosis present

## 2010-11-27 DIAGNOSIS — J309 Allergic rhinitis, unspecified: Secondary | ICD-10-CM | POA: Diagnosis present

## 2010-11-27 DIAGNOSIS — F411 Generalized anxiety disorder: Secondary | ICD-10-CM | POA: Diagnosis present

## 2010-11-27 DIAGNOSIS — M1990 Unspecified osteoarthritis, unspecified site: Secondary | ICD-10-CM | POA: Diagnosis present

## 2010-11-27 HISTORY — DX: Unspecified osteoarthritis, unspecified site: M19.90

## 2010-11-27 HISTORY — DX: Endocarditis, valve unspecified: I38

## 2010-11-27 HISTORY — DX: Cervicalgia: M54.2

## 2010-11-27 HISTORY — DX: Other chronic pain: G89.29

## 2010-11-27 HISTORY — DX: Sciatica, unspecified side: M54.30

## 2010-11-27 HISTORY — DX: Unspecified abnormalities of gait and mobility: R26.9

## 2010-11-27 HISTORY — DX: Slurred speech: R47.81

## 2010-11-27 HISTORY — DX: Anxiety disorder, unspecified: F41.9

## 2010-11-27 HISTORY — DX: Unspecified injury of unspecified achilles tendon, initial encounter: S86.009A

## 2010-11-27 HISTORY — DX: Acute myocardial infarction, unspecified: I21.9

## 2010-11-27 HISTORY — DX: Headache: R51

## 2010-11-27 HISTORY — DX: Gastro-esophageal reflux disease without esophagitis: K21.9

## 2010-11-27 LAB — DIFFERENTIAL
Basophils Relative: 0 % (ref 0–1)
Lymphs Abs: 2 10*3/uL (ref 0.7–4.0)
Monocytes Relative: 7 % (ref 3–12)
Neutro Abs: 2.9 10*3/uL (ref 1.7–7.7)
Neutrophils Relative %: 54 % (ref 43–77)

## 2010-11-27 LAB — COMPREHENSIVE METABOLIC PANEL
ALT: 12 U/L (ref 0–35)
Albumin: 3.5 g/dL (ref 3.5–5.2)
Alkaline Phosphatase: 80 U/L (ref 39–117)
BUN: 22 mg/dL (ref 6–23)
Chloride: 101 mEq/L (ref 96–112)
Potassium: 4.3 mEq/L (ref 3.5–5.1)
Sodium: 136 mEq/L (ref 135–145)
Total Bilirubin: 0.3 mg/dL (ref 0.3–1.2)

## 2010-11-27 LAB — CBC
Hemoglobin: 12.8 g/dL (ref 12.0–15.0)
MCHC: 34.6 g/dL (ref 30.0–36.0)
RBC: 3.9 MIL/uL (ref 3.87–5.11)

## 2010-11-27 LAB — APTT: aPTT: 25 seconds (ref 24–37)

## 2010-11-27 LAB — GLUCOSE, CAPILLARY: Glucose-Capillary: 119 mg/dL — ABNORMAL HIGH (ref 70–99)

## 2010-11-27 LAB — POCT I-STAT TROPONIN I

## 2010-11-27 MED ORDER — IOHEXOL 350 MG/ML SOLN
50.0000 mL | Freq: Once | INTRAVENOUS | Status: AC | PRN
  Administered 2010-11-27: 50 mL via INTRAVENOUS

## 2010-11-27 NOTE — ED Provider Notes (Signed)
History     CSN: [id number] Arrival date & time: 11/27/2010  3:12 PM   First MD Initiated Contact with Patient 11/27/10 1514      Chief Complaint  Patient presents with  . Aphasia  . Headache  . Weakness    (Consider location/radiation/quality/duration/timing/severity/associated sxs/prior treatment) HPI  Patient relates about  12:15 she was in a restaurant and had been telling her sisters a story before that time. But it 12:15 she started having difficulty saying what she wanted to say she also noted some numbness in the tips of her right fingers on the ulnar aspect and also had some minor numbness on the right side of her chin. She states she tried to go to the bathroom and felt very off balance. She also noted the top of her head was hurting she states it was not throbbing. She has a history of migraines in the past but that was many years ago and states they were usually right-sided. She denies any nausea vomiting visual changes. She states she has some chest pain without shortness of breath. She's not very clear about her chest pain she states she's having left-sided chest tenderness after her breast exam about a year ago and cannot tell me if this is the same pain or something new. She refused to have her sisters called EMS when the symptoms started. She had a doctor's appointment 2:15 to get a flu shot and she mentioned to them the symptoms and they've sent her to the ER. She states that the numbness lasted about an hour. And she feels like her speech and is back to normal. She relates she had something before in 2010 and was discharged from the ER with a diagnosis of possible anxiety. She states she had a MI in 2007 but had a normal catheterization done and was told she had some type of disease that had a [place] sounding name. She was seen by [facility] cardiology. Nothing made her symptoms worse today nothing made it better.    Past Medical History  Diagnosis Date  . Depression   .  GERD (gastroesophageal reflux disease)   . Hyperlipidemia   . Diabetes mellitus   . Hypertension   . Osteopenia   . Allergic rhinitis   . Takotsubo cardiomyopathy     with subsequent normalization of left ventricular systolic function  . Hyperlipidemia   . Anxiety   . Acute MI   . Valvular heart disease   . Acid reflux   . Degenerative joint disease   . Neck pain, chronic   . Sciatica   . Gait disturbance     Past Surgical History  Procedure Date  . Abdominal hysterectomy 1977    no oophorectomy  . Spinal fusion 2000    Dr. Vertell Limber  . Cataract extraction, bilateral 2011    Family History  Problem Relation Age of Onset  . Diabetes Sister   . Diabetes Sister   . Breast cancer      ?Aunt  . Intracerebral hemorrhage Daughter     assoc with post-partum    History  Substance Use Topics  . Smoking status: Never Smoker   . Smokeless tobacco: Never Used  . Alcohol Use: No   patient states her daughter and granddaughter live with her.  OB History    Grav Para Term Preterm Abortions TAB SAB Ect Mult Living                  Review of Systems  All other systems reviewed and are negative.    Allergies  Pioglitazone  Home Medications   Current Outpatient Rx  Name Route Sig Dispense Refill  . ASPIRIN 325 MG PO TABS Oral Take 325 mg by mouth daily.     Marland Kitchen BISOPROLOL FUMARATE 5 MG PO TABS Oral Take 5 mg by mouth daily.     . FENOFIBRATE 48 MG PO TABS Oral Take 48 mg by mouth daily.     Marland Kitchen GLIMEPIRIDE 1 MG PO TABS Oral Take 2 mg by mouth daily.     Marland Kitchen LISINOPRIL 10 MG PO TABS Oral Take 10 mg by mouth daily.     Marland Kitchen LOVASTATIN 40 MG PO TABS Oral Take 40 mg by mouth daily. Take 2 tabs daily     . METFORMIN HCL ER (MOD) 500 MG PO TB24 Oral Take 1,000 mg by mouth daily.     Marland Kitchen ALPRAZOLAM 0.5 MG PO TABS Oral Take 0.5 mg by mouth as directed. for anxiety       BP 183/77  Pulse 72  Temp(Src) 98.3 F (36.8 C) (Oral)  Resp 18  Ht 5\' 1"  (1.549 m)  Wt 150 lb (68.04 kg)   BMI 28.34 kg/m2  SpO2 96%  Vital signs mild hypertension  Physical Exam  Vitals reviewed. Constitutional: She is oriented to person, place, and time. She appears well-developed and well-nourished.  HENT:  Head: Normocephalic and atraumatic.  Mouth/Throat: Uvula is midline, oropharynx is clear and moist and mucous membranes are normal.  Eyes: Conjunctivae and EOM are normal. Pupils are equal, round, and reactive to light.  Neck: Normal range of motion. Neck supple.       No bruits noted  Cardiovascular: Normal rate, regular rhythm and normal heart sounds.   Pulmonary/Chest: Effort normal and breath sounds normal.  Abdominal: Soft. Bowel sounds are normal.  Musculoskeletal: Normal range of motion.       No edema noted  Neurological: She is alert and oriented to person, place, and time. She has normal reflexes.       Patient has mild weakness in her right grip, she does not have pronator drift on either side. There is no cranial nerve deficit noted. She seems to have a little more difficulty doing finger to nose with her right hand compared to her left hand. She has some minor difficulty raising her legs against gravity but he was equal bilaterally her speech is noted to be normal no slurring. She seems to be oriented and can follow commands.  Skin: Skin is warm and dry.  Psychiatric: She has a normal mood and affect. Her behavior is normal. Thought content normal.    ED Course  Procedures (including critical care time)  17:23 Dr Clovis Riley, radiologist states her CT head shows SAH on the left pariental lobe which is new from prior CT studies.  17:37 Dr Samson Frederic, neurology states to consult with neurosurgery and he can see patient if they want him to  18:24 Dr Vertell Limber neurosurgery, has reviewed CT scan, feels not c/w aneurysm and states neurology can evaluated.  19:15 Dr Mack Hook, neuro states to do CT angio of brain and call back.   22:25 Dr Mack Hook, neuro states he will admit.    Results for orders placed during the hospital encounter of 11/27/10  CBC      Component Value Range   WBC 5.4  4.0 - 10.5 (K/uL)   RBC 3.90  3.87 - 5.11 (MIL/uL)   Hemoglobin 12.8  12.0 -  15.0 (g/dL)   HCT 37.0  36.0 - 46.0 (%)   MCV 94.9  78.0 - 100.0 (fL)   MCH 32.8  26.0 - 34.0 (pg)   MCHC 34.6  30.0 - 36.0 (g/dL)   RDW 12.1  11.5 - 15.5 (%)   Platelets 254  150 - 400 (K/uL)  DIFFERENTIAL      Component Value Range   Neutrophils Relative 54  43 - 77 (%)   Neutro Abs 2.9  1.7 - 7.7 (K/uL)   Lymphocytes Relative 38  12 - 46 (%)   Lymphs Abs 2.0  0.7 - 4.0 (K/uL)   Monocytes Relative 7  3 - 12 (%)   Monocytes Absolute 0.4  0.1 - 1.0 (K/uL)   Eosinophils Relative 2  0 - 5 (%)   Eosinophils Absolute 0.1  0.0 - 0.7 (K/uL)   Basophils Relative 0  0 - 1 (%)   Basophils Absolute 0.0  0.0 - 0.1 (K/uL)  COMPREHENSIVE METABOLIC PANEL      Component Value Range   Sodium 136  135 - 145 (mEq/L)   Potassium 4.3  3.5 - 5.1 (mEq/L)   Chloride 101  96 - 112 (mEq/L)   CO2 21  19 - 32 (mEq/L)   Glucose, Bld 285 (*) 70 - 99 (mg/dL)   BUN 22  6 - 23 (mg/dL)   Creatinine, Ser 0.90  0.50 - 1.10 (mg/dL)   Calcium 9.1  8.4 - 10.5 (mg/dL)   Total Protein 6.9  6.0 - 8.3 (g/dL)   Albumin 3.5  3.5 - 5.2 (g/dL)   AST 18  0 - 37 (U/L)   ALT 12  0 - 35 (U/L)   Alkaline Phosphatase 80  39 - 117 (U/L)   Total Bilirubin 0.3  0.3 - 1.2 (mg/dL)   GFR calc non Af Amer 61 (*) >90 (mL/min)   GFR calc Af Amer 71 (*) >90 (mL/min)  APTT      Component Value Range   aPTT 25  24 - 37 (seconds)  PROTIME-INR      Component Value Range   Prothrombin Time 12.3  11.6 - 15.2 (seconds)   INR 0.90  0.00 - 1.49   POCT I-STAT TROPONIN I      Component Value Range   Troponin i, poc 0.00  0.00 - 0.08 (ng/mL)   Comment 3           GLUCOSE, CAPILLARY      Component Value Range   Glucose-Capillary 119 (*) 70 - 99 (mg/dL)   Laboratory interpretation all normal  Ct Head Wo Contrast  11/27/2010  *RADIOLOGY REPORT*   Clinical Data: Expressive aphasia.  Headache.  CT HEAD WITHOUT CONTRAST  Technique:  Contiguous axial images were obtained from the base of the skull through the vertex without contrast.  Comparison: 06/28/2009  Findings: There is diffuse patchy low density throughout the subcortical and periventricular white matter consistent with chronic small vessel ischemic change.  There is a focal area of subarachnoid hyperdensity which overlies the left parietal lobe, image number 17.No significant edema or midline shift.  No evidence for infarction, hydrocephalus, or mass lesion.  There is no extra axial fluid collection.  The skull and paranasal sinuses are normal.  IMPRESSION: 1.  Suspect a small subarachnoid hemorrhage overlying the left parietal lobe.  2.  Small vessel ischemic change and brain atrophy. Critical Value/emergent results were called by telephone at the time of interpretation on 11/27/2010  at 5:24 p.m.  to  Dr. Tomi Bamberger, who verbally acknowledged these results.  Original Report Authenticated By: Angelita Ingles, M.D.   CT angio brain is negative for aneurysm.     Date: 11/27/2010  Rate: 66  Rhythm: normal sinus rhythm  QRS Axis: normal  Intervals: normal  ST/T Wave abnormalities: normal  Conduction Disutrbances:none  Narrative Interpretation:   Old EKG Reviewed: unchanged from 06/28/2009   Diagnoses that have been ruled out:  Diagnoses that are still under consideration:  Final diagnoses:  Stroke  SAH (subarachnoid hemorrhage)    Plan  Admit   CRITICAL CARE Performed by: Rolland Porter L   Total critical care time: 30 minutes  Critical care time was exclusive of separately billable procedures and treating other patients.  Critical care was necessary to treat or prevent imminent or life-threatening deterioration.  Critical care was time spent personally by me on the following activities: development of treatment plan with patient and/or surrogate as well as nursing, discussions  with consultants, evaluation of patient's response to treatment, examination of patient, obtaining history from patient or surrogate, ordering and performing treatments and interventions, ordering and review of laboratory studies, ordering and review of radiographic studies, pulse oximetry and re-evaluation of patient's condition.   Rolland Porter, MD, [place], MD 11/28/10 Laureen Abrahams

## 2010-11-27 NOTE — Assessment & Plan Note (Signed)
Pt has cardiac hx and multiple risk factors for repeat event.  Reports she is having L sided chest pain, SOB, HAs, dizziness.  4 baby ASA given in office due to chest pain and SOB.  EKG w/ Twave inversions in V1-2 but this is unchanged from previous.  EMS called emergently for pt transport to [facility].

## 2010-11-27 NOTE — ED Notes (Signed)
Pt was at lunch with her sister this afternoon around 12pm. She had onset of expressive aphasia, headache, and difficulty walking. Upon arrival she is alert and oriented. Speech is clear. Pt states that she had some intermittent tingling to the right hand. Pt states that she was at lunch trying to tell a story to her family members and just felt like she couldn't get "her mouth to say it right". No facial droop noted.

## 2010-11-27 NOTE — ED Notes (Signed)
Pt transported to CT ?

## 2010-11-27 NOTE — ED Notes (Signed)
Accidentally clicked on Pt chart when for triage. Pt still in room. Pt aware she is waiting for CT angio.

## 2010-11-27 NOTE — Assessment & Plan Note (Signed)
Pt's speech is normal in office and no CN deficit noted but given her CP and difficulty walking must r/o TIA/CVA.  EMS called for emergent transport.
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
## 2010-11-27 NOTE — Assessment & Plan Note (Signed)
Pt having dizziness and inability to walk w/out difficulty.  Some increased tone/reflexes on R and also some weakness on R.  Pt having headache on L side- keeps holding her head.  EMS called emergently for pt transport to [facility].

## 2010-11-27 NOTE — H&P (Signed)
Chief Complaint: couldn't talk; HA;   HPI: Sandra [name] is an 77 y.o. female with history of HTN, DM, MI, with sudden onset of difficulty speaking, headache, ataxia.  12:15p, sudden onset diff getting words out.  Similar to anxiety attack in 2010.  She developed HA right before the speech prob (87min before).  Also subtle right face and hand numbness.  HA, top of head, without N/V/photo or phonophobia.  Went to PCP office around 2pm.  Sent to [facility] ER via EMS.  By the time arrived to ER, symptoms resolved.  HA still present.    Did hit head in May 2012 (bent over and hit head on wood beam; no LOC).  Review of Systems - Negative except HA, mild left chest pain, stomach pain, diarrhea.     Past Medical History  Diagnosis Date  . Depression   . GERD (gastroesophageal reflux disease)   . Hyperlipidemia   . Diabetes mellitus   . Hypertension   . Osteopenia   . Allergic rhinitis   . Takotsubo cardiomyopathy     with subsequent normalization of left ventricular systolic function  . Hyperlipidemia   . Anxiety   . Acute MI   . Valvular heart disease   . Acid reflux   . Degenerative joint disease   . Neck pain, chronic   . Sciatica   . Gait disturbance     Past Surgical History  Procedure Date  . Abdominal hysterectomy 1977    no oophorectomy  . Spinal fusion 2000    Dr. Vertell Limber  . Cataract extraction, bilateral 2011    Family History  Problem Relation Age of Onset  . Diabetes Sister   . Diabetes Sister   . Breast cancer      ?Aunt    Social History:  reports that she has never smoked. She has never used smokeless tobacco. She reports that she does not drink alcohol or use illicit drugs.  Allergies:  Allergies  Allergen Reactions  . Pioglitazone     REACTION: EDEMA    Medications Prior to Admission  Medication Sig Dispense Refill  . aspirin 325 MG tablet Take 325 mg by mouth daily.       . bisoprolol (ZEBETA) 5 MG tablet Take 5 mg by mouth daily.       . fenofibrate  (TRICOR) 48 MG tablet Take 48 mg by mouth daily.       Marland Kitchen glimepiride (AMARYL) 1 MG tablet Take 2 mg by mouth daily.       Marland Kitchen lisinopril (PRINIVIL,ZESTRIL) 10 MG tablet Take 10 mg by mouth daily.       Marland Kitchen lovastatin (MEVACOR) 40 MG tablet Take 40 mg by mouth daily. Take 2 tabs daily       . metFORMIN (GLUMETZA) 500 MG (MOD) 24 hr tablet Take 1,000 mg by mouth daily.       Marland Kitchen ALPRAZolam (XANAX) 0.5 MG tablet Take 0.5 mg by mouth as directed. for anxiety         Results for orders placed during the hospital encounter of 11/27/10 (from the past 48 hour(s))  CBC     Status: Normal   Collection Time   11/27/10  3:41 PM      Component Value Range Comment   WBC 5.4  4.0 - 10.5 (K/uL)    RBC 3.90  3.87 - 5.11 (MIL/uL)    Hemoglobin 12.8  12.0 - 15.0 (g/dL)    HCT 37.0  36.0 - 46.0 (%)  MCV 94.9  78.0 - 100.0 (fL)    MCH 32.8  26.0 - 34.0 (pg)    MCHC 34.6  30.0 - 36.0 (g/dL)    RDW 12.1  11.5 - 15.5 (%)    Platelets 254  150 - 400 (K/uL)   DIFFERENTIAL     Status: Normal   Collection Time   11/27/10  3:41 PM      Component Value Range Comment   Neutrophils Relative 54  43 - 77 (%)    Neutro Abs 2.9  1.7 - 7.7 (K/uL)    Lymphocytes Relative 38  12 - 46 (%)    Lymphs Abs 2.0  0.7 - 4.0 (K/uL)    Monocytes Relative 7  3 - 12 (%)    Monocytes Absolute 0.4  0.1 - 1.0 (K/uL)    Eosinophils Relative 2  0 - 5 (%)    Eosinophils Absolute 0.1  0.0 - 0.7 (K/uL)    Basophils Relative 0  0 - 1 (%)    Basophils Absolute 0.0  0.0 - 0.1 (K/uL)   COMPREHENSIVE METABOLIC PANEL     Status: Abnormal   Collection Time   11/27/10  3:41 PM      Component Value Range Comment   Sodium 136  135 - 145 (mEq/L)    Potassium 4.3  3.5 - 5.1 (mEq/L)    Chloride 101  96 - 112 (mEq/L)    CO2 21  19 - 32 (mEq/L)    Glucose, Bld 285 (*) 70 - 99 (mg/dL)    BUN 22  6 - 23 (mg/dL)    Creatinine, Ser 0.90  0.50 - 1.10 (mg/dL)    Calcium 9.1  8.4 - 10.5 (mg/dL)    Total Protein 6.9  6.0 - 8.3 (g/dL)    Albumin 3.5  3.5  - 5.2 (g/dL)    AST 18  0 - 37 (U/L) HEMOLYSIS AT THIS LEVEL MAY AFFECT RESULT   ALT 12  0 - 35 (U/L)    Alkaline Phosphatase 80  39 - 117 (U/L)    Total Bilirubin 0.3  0.3 - 1.2 (mg/dL)    GFR calc non Af Amer 61 (*) >90 (mL/min)    GFR calc Af Amer 71 (*) >90 (mL/min)   APTT     Status: Normal   Collection Time   11/27/10  3:41 PM      Component Value Range Comment   aPTT 25  24 - 37 (seconds)   PROTIME-INR     Status: Normal   Collection Time   11/27/10  3:41 PM      Component Value Range Comment   Prothrombin Time 12.3  11.6 - 15.2 (seconds)    INR 0.90  0.00 - 1.49    POCT I-STAT TROPONIN I     Status: Normal   Collection Time   11/27/10  4:23 PM      Component Value Range Comment   Troponin i, poc 0.00  0.00 - 0.08 (ng/mL)    Comment 3            GLUCOSE, CAPILLARY     Status: Abnormal   Collection Time   11/27/10 10:04 PM      Component Value Range Comment   Glucose-Capillary 119 (*) 70 - 99 (mg/dL)     Ct Head Wo Contrast  11/27/2010 CT HEAD WITHOUT CONTRAST - Suspect a small subarachnoid hemorrhage overlying the left parietal lobe.  2.  Small vessel ischemic change and brain  atrophy. Critical Value/emergent results were called by telephone at the time of interpretation on 11/27/2010  at 5:24 p.m.  to  Dr. Tomi Bamberger, who verbally acknowledged these results.  Original Report Authenticated By: Angelita Ingles, M.D.  I reviewed CT head images myself: subtle hyperdensity in the left parietal sulci; Ddx: SAH, calcification.  Also periventricular hypodensities, likely chronic small vessel ischemic disease.  I also reviewed CTA head images, which again show left parietal sulcal hyperdensities, better seen on CTA study.  No definite aneurysm.  -VRP   Blood pressure 134/118, pulse 67, temperature 98.6 F (37 C), temperature source Oral, resp. rate 20, height 5\' 1"  (1.549 m), weight 68.04 kg (150 lb), SpO2 99.00%. Temp:  [97.6 F (36.4 C)-98.6 F (37 C)] 98.6 F (37 C) (11/08  1724) Pulse Rate:  [64-71] 67  (11/08 1745) Resp:  [20] 20  (11/08 1520) BP: (127-156)/(80-118) 134/118 mmHg (11/08 1745) SpO2:  [97 %-100 %] 99 % (11/08 1745) FiO2 (%):  [2 %] 2 % (11/08 1745) Weight:  [68.04 kg (150 lb)] 150 lb (68.04 kg) (11/08 1745)  GENERAL EXAM: Patient is in no distress  CARDIOVASCULAR: Regular rate and rhythm, no murmurs, no carotid bruits  NEUROLOGIC: MENTAL STATUS: awake, alert, language fluent, comprehension intact, naming intact CRANIAL NERVE: no papilledema on fundoscopic exam, pupils equal and reactive to light, visual fields full to confrontation, extraocular muscles intact, no nystagmus, facial sensation and strength symmetric, uvula midline, shoulder shrug symmetric, tongue midline. MOTOR: normal bulk and tone, full strength in the BUE, BLE SENSORY: normal and symmetric to light touch, pinprick, temperature, vibration COORDINATION: finger-nose-finger, fine finger movements, normal REFLEXES: deep tendon reflexes present and symmetric  Assessment/Plan:  77 year old female with sudden slurred speech, HA, expressive aphasia, right face/hand numbness.    Assessment: Symptoms correlate with the left parietal hyperdensity / SAH.  Do not suspect aneurysmal SAH.  Atraumatic convexity SAH could be due to RCVS (reversible cerebral vasoconstriction syndrome), amyloid angiopathy, HTN.    Plan:   1. will admit to Stroke MD service  2. MRI brain (w/wo)  3. TTE  4. carotid u/s  5. BP control    Nannie Starzyk 11/27/2010, 10:37 PM

## 2010-11-27 NOTE — Progress Notes (Signed)
  Subjective:    Patient ID: Sandra [name], female    DOB: 11-06-1933, 77 y.o.   MRN: [id number]  HPI 77 yo woman who walked into office for flu shot and sisters reported she had an episode at lunch just prior during which she was unable to speak.  When regained ability to speak it was slurred.  Developed L sided chest pain, HA, SOB, dizziness.  Has cardiac hx- risk factors include DM, HTN, Hyperlipidemia.  Was unable to walk straight while coming into the office.  Reports she had something similar previously and was told it was anxiety.  Took 1/2 tab xanax prior to arrival w/out relief.     Review of Systems For ROS see HPI     Objective:   Physical Exam  Constitutional: She is oriented to person, place, and time. She appears well-developed and well-nourished.       Obviously uncomfortable  HENT:  Head: Normocephalic and atraumatic.  Eyes: EOM are normal. Pupils are equal, round, and reactive to light.  Neck: Neck supple.  Cardiovascular: Normal rate, regular rhythm, normal heart sounds and intact distal pulses.   Pulmonary/Chest: Effort normal and breath sounds normal. No respiratory distress. She has no wheezes. She has no rales.  Musculoskeletal: She exhibits no edema.  Neurological: She is alert and oriented to person, place, and time. No cranial nerve deficit. Coordination (unable to walk straight or stand on scale w/out stumbling) abnormal.       DTRs normal on L, more brisk on R Some weakness in R grip and knee extension against resistance  Skin: Skin is warm and dry.          Assessment & Plan:

## 2010-11-28 ENCOUNTER — Inpatient Hospital Stay (HOSPITAL_COMMUNITY): Payer: Medicare Other

## 2010-11-28 DIAGNOSIS — I6789 Other cerebrovascular disease: Secondary | ICD-10-CM

## 2010-11-28 DIAGNOSIS — R569 Unspecified convulsions: Secondary | ICD-10-CM

## 2010-11-28 LAB — GLUCOSE, CAPILLARY
Glucose-Capillary: 261 mg/dL — ABNORMAL HIGH (ref 70–99)
Glucose-Capillary: 169 mg/dL — ABNORMAL HIGH (ref 70–99)

## 2010-11-28 LAB — SEDIMENTATION RATE: Sed Rate: 11 mm/hr (ref 0–22)

## 2010-11-28 MED ORDER — SODIUM CHLORIDE 0.9 % IV SOLN
INTRAVENOUS | Status: DC
  Administered 2010-11-28 (×2): via INTRAVENOUS

## 2010-11-28 MED ORDER — LABETALOL HCL 5 MG/ML IV SOLN: 10.0000 mg | INTRAVENOUS | Status: DC | PRN

## 2010-11-28 MED ORDER — LORAZEPAM 2 MG/ML IJ SOLN
1.0000 mg | Freq: Once | INTRAMUSCULAR | Status: AC
  Administered 2010-11-28: 1 mg via INTRAVENOUS
  Filled 2010-11-28: qty 1

## 2010-11-28 MED ORDER — GADOBENATE DIMEGLUMINE 529 MG/ML IV SOLN
15.0000 mL | Freq: Once | INTRAVENOUS | Status: AC | PRN
  Administered 2010-11-28: 15 mL via INTRAVENOUS

## 2010-11-28 MED ORDER — ACETAMINOPHEN 650 MG RE SUPP: 650.0000 mg | RECTAL | Status: DC | PRN

## 2010-11-28 MED ORDER — SENNOSIDES-DOCUSATE SODIUM 8.6-50 MG PO TABS
1.0000 | ORAL_TABLET | Freq: Two times a day (BID) | ORAL | Status: DC
  Administered 2010-11-28: 1 via ORAL

## 2010-11-28 MED ORDER — INFLUENZA VAC TYP A&B SURF ANT IM INJ
0.5000 mL | INJECTION | INTRAMUSCULAR | Status: DC
  Filled 2010-11-28: qty 0.5

## 2010-11-28 MED ORDER — ACETAMINOPHEN 325 MG PO TABS
650.0000 mg | ORAL_TABLET | ORAL | Status: DC | PRN
  Administered 2010-11-28 – 2010-11-29 (×3): 650 mg via ORAL
  Filled 2010-11-28 (×3): qty 2

## 2010-11-28 MED ORDER — INSULIN ASPART 100 UNIT/ML ~~LOC~~ SOLN
0.0000 [IU] | Freq: Three times a day (TID) | SUBCUTANEOUS | Status: DC
  Administered 2010-11-28 (×3): 3 [IU] via SUBCUTANEOUS
  Administered 2010-11-29: 5 [IU] via SUBCUTANEOUS
  Administered 2010-11-29: 3 [IU] via SUBCUTANEOUS
  Filled 2010-11-28: qty 3

## 2010-11-28 MED ORDER — PANTOPRAZOLE SODIUM 40 MG IV SOLR
40.0000 mg | Freq: Every day | INTRAVENOUS | Status: DC
  Administered 2010-11-28: 40 mg via INTRAVENOUS
  Filled 2010-11-28 (×2): qty 40

## 2010-11-28 MED ORDER — ONDANSETRON HCL 4 MG/2ML IJ SOLN: 4.0000 mg | Freq: Four times a day (QID) | INTRAMUSCULAR | Status: DC | PRN

## 2010-11-28 NOTE — Progress Notes (Signed)
OT consult received, noted pt. On bedrest. MD please update activity orders as appropriate.  Will re-attempt as time allows to complete evaluation when bedrest discontinued.  Thanks! Ezekeil Bethel, OTR/L 11/28/2010

## 2010-11-28 NOTE — Progress Notes (Signed)
Patient at baseline functioning with no residual deficits. States she will have 24 assist/supervision upon d/c home. No further acute OT needs at this time. Signing off. Thank you.  11/28/2010 Samier Jaco   OTR/L Pager: [phone]

## 2010-11-28 NOTE — Progress Notes (Signed)
Physical Therapy Evaluation Patient Details Name: Sandra [name] MRN: [id number] DOB: 09/21/1933 Today's Date: 11/28/2010  Problem List:  Patient Active Problem List  Diagnoses  . DIABETES MELLITUS, TYPE II  . DIABETIC  RETINOPATHY  . HYPERLIPIDEMIA  . ANXIETY  . DEPRESSION  . HYPERTENSION  . AMI  . VALVULAR HEART DISEASE  . ALLERGIC RHINITIS  . ACID REFLUX DISEASE  . DEGENERATIVE JOINT DISEASE, CERVICAL SPINE  . NECK PAIN, CHRONIC  . SCIATICA  . OSTEOPOROSIS  . GAIT DISTURBANCE  . Breast pain  . Back pain  . Chest pain  . Slurred speech  . Achilles tendon injury  . Headache  . Subarachnoid hemorrhage    Past Medical History:  Past Medical History  Diagnosis Date  . Depression   . GERD (gastroesophageal reflux disease)   . Hyperlipidemia   . Diabetes mellitus   . Hypertension   . Osteopenia   . Allergic rhinitis   . Takotsubo cardiomyopathy     with subsequent normalization of left ventricular systolic function  . Hyperlipidemia   . Anxiety   . Acute MI   . Valvular heart disease   . Acid reflux   . Degenerative joint disease   . Neck pain, chronic   . Sciatica   . Gait disturbance    Past Surgical History:  Past Surgical History  Procedure Date  . Abdominal hysterectomy 1977    no oophorectomy  . Spinal fusion 2000    Dr. Vertell Limber  . Cataract extraction, bilateral 2011    PT Assessment/Plan/Recommendation PT Assessment Clinical Impression Statement: Pt admitted with a diagnosis of CVA. Pt presents with no imairments in strength, ROM, and balance is WFL. Pt is cleared to ambulate in the room or hallway without assistance. Pt does not have a skilled PT need and is d/c from PT services. PT Recommendation/Assessment: Patent does not need any further PT services No Skilled PT: Patient is independent with all acitivity/mobility PT Goals     PT Evaluation Precautions/Restrictions    Prior Functioning  Home Living Lives With: Daughter Receives Help  From: Family Type of Home: House Home Layout: One level Home Access: Stairs to enter Entrance Stairs-Rails: None Technical brewer of Steps: 1 Bathroom Shower/Tub: Probation officer: Straight cane Prior Function Level of Independence: Independent with homemaking with ambulation Driving: Yes Vocation: Retired Public librarian Arousal/Alertness: Awake/alert Overall Cognitive Status: Appears within functional limits for tasks assessed Orientation Level: Oriented X4 Sensation/Coordination Sensation Light Touch: Appears Intact Coordination Gross Motor Movements are Fluid and Coordinated: Yes Extremity Assessment RLE Assessment RLE Assessment: Within Functional Limits LLE Assessment LLE Assessment: Within Functional Limits Mobility (including Balance) Bed Mobility Bed Mobility: Yes Supine to Sit: 7: Independent Transfers Transfers: Yes Sit to Stand: 6: Modified independent (Device/Increase time);With upper extremity assist Stand to Sit: 6: Modified independent (Device/Increase time);With upper extremity assist;To chair/3-in-1 Ambulation/Gait Ambulation/Gait: Yes Ambulation/Gait Assistance: 7: Independent Ambulation Distance (Feet): 225 Feet Assistive device: None Gait Pattern: Within Functional Limits Stairs: No  Posture/Postural Control Posture/Postural Control: No significant limitations Balance Balance Assessed: Yes Static Standing Balance Static Standing - Balance Support: No upper extremity supported Static Standing - Level of Assistance: 7: Independent Exercise    End of Session PT - End of Session Equipment Utilized During Treatment: Gait belt Activity Tolerance: Patient tolerated treatment well Patient left: in chair Nurse Communication: Mobility status for transfers;Mobility status for ambulation;Other (comment) (RN cleared pt for increased activity level oob verbally) General Behavior During Session:  WFL for tasks performed Cognition: [facility] for tasks performed  Lelon Mast 11/28/2010, 8:52 AM

## 2010-11-28 NOTE — Progress Notes (Signed)
STROKE TEAM PROGRESS NOTE SUBJECTIVE Sandra [name] is an 77 y.o. female with history of HTN, DM, MI, with sudden onset of difficulty speaking, headache, ataxia. 12:15p, sudden onset diff getting words out. Similar to anxiety attack in 2010. She developed HA right before the speech prob (54min before). Also subtle right face and hand numbness. HA, top of head, without N/V/photo or phonophobia. Went to PCP office around 2pm. Sent to [facility] ER via EMS. By the time arrived to ER, symptoms resolved.she is doing much better today. Headache is resolved. She has only minimum word finding difficulties speech is fluent mostly. She has not been on warfarin but has been taking aspirin. She has no known prior history of seizures, TIA or strokes. Her blood pressure was not significantly elevated on admission to suggest PRES. OBJECTIVE Vital signs: Temp: 97.8 F (36.6 C) (11/09 0658) BP: 158/73 mmHg (11/09 0658) Pulse Rate: 72  (11/09 0658) Respiratory Rate: 16  Intake/Output from previous day: 11/08 0701 - 11/09 0700 In: 227.5 [I.V.:227.5] Out: -    IV Fluid Intake:   . sodium chloride 75 mL/hr at 11/28/10 0158    Diet:  carb mod med, thin liquids  Activity: Up with assistance  DVT Prophylaxis:  SCDs   Studies: CBC    Component Value Date/Time   WBC 5.4 11/27/2010 1541   RBC 3.90 11/27/2010 1541   HGB 12.8 11/27/2010 1541   HCT 37.0 11/27/2010 1541   PLT 254 11/27/2010 1541   MCV 94.9 11/27/2010 1541   MCH 32.8 11/27/2010 1541   MCHC 34.6 11/27/2010 1541   RDW 12.1 11/27/2010 1541   LYMPHSABS 2.0 11/27/2010 1541   MONOABS 0.4 11/27/2010 1541   EOSABS 0.1 11/27/2010 1541   BASOSABS 0.0 11/27/2010 1541   CMP     Component Value Date/Time   NA 136 11/27/2010 1541   K 4.3 11/27/2010 1541   CL 101 11/27/2010 1541   CO2 21 11/27/2010 1541   GLUCOSE 285* 11/27/2010 1541   BUN 22 11/27/2010 1541   CREATININE 0.90 11/27/2010 1541   CALCIUM 9.1 11/27/2010 1541   PROT 6.9 11/27/2010 1541   ALBUMIN 3.5  11/27/2010 1541   AST 18 11/27/2010 1541   ALT 12 11/27/2010 1541   ALKPHOS 80 11/27/2010 1541   BILITOT 0.3 11/27/2010 1541   GFRNONAA 61* 11/27/2010 1541   GFRAA 71* 11/27/2010 1541    Lipid Panel     Component Value Date/Time   CHOL 178 11/19/2009 1102   TRIG 335.0* 11/19/2009 1102   HDL 41.60 11/19/2009 1102   CHOLHDL 4 11/19/2009 1102   VLDL 67.0* 11/19/2009 1102   LDLCALC 97 07/21/2007 1116   hgba1C    Lab Results  Component Value Date   HGBA1C 6.8* 07/15/2009   HGBA1C 7.3* 12/26/2008   HGBA1C 7.4* 09/06/2008    CT of the brain:  subtle hyperdensity in the left parietal sulci;  SAH, calcification. Also periventricular hypodensities, likely chronic small vessel ischemic disease. left parietal sulcal hyperdensities. No definite aneurysm.    CTA head: 1. Minor intracranial atherosclerosis. No intracranial stenosis  or major branch occlusion.  2. Questionable 1-2 mm aneurysm versus infundibulum directed  laterally at the left basilar artery tip.  3. Persistent asymmetric density along the left posterior temporal  lobes / occipital lobe. This could reflect trace blood products  and/or enhancement. Consider follow-up brain MRI if there is  suspicion of encephalitis or occult infarct.  MRI of the brain: ordered, to be done  2D  Echocardiogram: ordered, to be done  Carotid Doppler:  ordered, to be done  Physical Exam:  pleasant elderly Caucasian lady currently not in distress. Cardiac exam no murmur or gallop. Lungs clear to auscultation. Next upper without bruit.  Neurological exam awake alert oriented to time place and person. Speech appears fluent with only occasional word finding difficulties. No paraphasic errors during my exam. Able to name, comprehend and repeat quite well. Eye movements are full range without nystagmus. Face is symmetric without weakness. Tongue is midline.  Motor system exam reveals no upper or lower extremity drift. Symmetric and equal strength in all 4  extremities. Tone, reflexes and sensation are normal. Gait was not tested.  ASSESSMENT Patient Active Problem List  Diagnoses  . DIABETES MELLITUS, TYPE II  . DIABETIC  RETINOPATHY  . HYPERLIPIDEMIA  . ANXIETY  . DEPRESSION  . HYPERTENSION  . AMI  . VALVULAR HEART DISEASE  . ALLERGIC RHINITIS  . ACID REFLUX DISEASE  . DEGENERATIVE JOINT DISEASE, CERVICAL SPINE  . NECK PAIN, CHRONIC  . SCIATICA  . OSTEOPOROSIS  . GAIT DISTURBANCE  . Breast pain  . Back pain  . Chest pain  . Slurred speech  . Achilles tendon injury  . Headache  . Subarachnoid hemorrhage   77 year old female with sudden slurred speech, HA, expressive aphasia, right face/hand numbness possible left hemispheric TIA versus sensory seizure from localized left frontal convexity is subarachnoid hemorrhage. Etiology of this is indeterminate but possibly small cortical vein thrombosis which is difficult to visualize on CT angiogram.Reversible cerebral vasoconstriction syndrome, hypertension or amyloid angiopathy would be unlikely.   Hospital day # 2  TREATMENT/PLAN Stroke workup: 2D, Carotid doppler, MRI. Check EEG. Check vasculitis labs. Ativan on call to MRI.Will follow  SHARON BIBY, AVNP, ANP-BC, GNP-BC [facility] Pager: [phone] 11/28/2010 8:18 AM

## 2010-11-28 NOTE — Procedures (Signed)
EEG NUMBER:  [id number]  This routine EEG was requested in this 77 year old female with a history of hypertension, diabetes and myocardial infarction, who was admitted with sudden difficulty speaking and ataxia.  She also had subtle right face and hand numbness.  Medications include alprazolam.  The EEG was done with the patient awake, drowsy and asleep.  During periods of maximal wakefulness she had a poorly regulated, poorly sustained 9 cycle per second posterior dominant rhythm.  Background activities were composed of low amplitude to medium amplitude poorly organized beta activities.  These were particularly well seen in the central regions.  They were symmetric.  Photic stimulation produced a symmetric driving response.  Hyperventilation was not performed.  The patient became drowsy as evidenced by attenuation of the alpha rhythm and muscle activity.  Slower activities were seen symmetrically in the theta and delta range.  Symmetric sleep spindles and vertex sharp waves were also seen suggesting stage 2 sleep.  CLINICAL INTERPRETATION:  This routine EEG done with the patient awake, drowsy and sleep is normal.          ______________________________ Neena Rhymes, MD    [id number] D:  11/28/2010 17:48:51  T:  11/28/2010 17:57:12  Job #:  [id number]

## 2010-11-28 NOTE — Progress Notes (Signed)
*  PRELIMINARY RESULTS* Echocardiogram 2D Echocardiogram has been performed.  Ebbie Ridge RDCS 11/28/2010, 11:07 AM

## 2010-11-28 NOTE — Progress Notes (Signed)
Speech Language/Pathology Speech Language Pathology Evaluation Patient Details Name: Sandra [name] MRN: [id number] DOB: 1933-12-28 Today's Date: 11/28/2010  Clinical Impression: Pt presents with normal speech/language/cognitive function.  No f/u SLP warranted.  Follow up Recommendations: None  Juan Quam Laurice 11/28/2010, 11:59 AM

## 2010-11-28 NOTE — Progress Notes (Signed)
*  PRELIMINARY RESULTS*  Carotid Doppler has been performed. No ICA stenosis with antegrade vertebral flow bilaterally.  Landry Mellow 11/28/2010, 10:16 AM

## 2010-11-29 LAB — C3 COMPLEMENT: C3 Complement: 157 mg/dL (ref 90–180)

## 2010-11-29 MED ORDER — DEPAKOTE ER 500 MG PO TB24: 500.0000 mg | ORAL_TABLET | Freq: Every day | ORAL | Status: DC

## 2010-11-29 NOTE — Discharge Summary (Signed)
  Patient ID: Sandra [name] [id number] 76 y.o. [birth year]  Admit date: 11/27/2010  Discharge date and time: 11/29/2010, 11:54 AM  Admitting Physician: Md Stroke   Discharge Physician: Antony Contras Admission Diagnoses: SAH (subarachnoid hemorrhage) [430] Stroke [434.91] [facility]  Discharge Diagnoses: #1 Localized left parietal small subarachnoid hemorrhage of unclear etiology. #2 headache resolved. #3 hypertension #4 diabetes  Admission Condition: fair  Discharged Condition: good  Indication for Admission:  Headache and subarachnoid hemorrhage  Hospital Course:Sandra [name] is an 77 y.o. female with history of HTN, DM, MI, with sudden onset of difficulty speaking, headache, ataxia. 12:15p, sudden onset diff getting words out. Similar to anxiety attack in 2010. She developed HA right before the speech prob (49min before). Also subtle right face and hand numbness. HA, top of head, without N/V/photo or phonophobia. Went to PCP office around 2pm. Sent to [facility] ER via EMS. By the time arrived to ER, symptoms resolved  CT scan of the head showed a subtle left parietal hyperdensity suggestive of subarachnoid hemorrhage. Followup CT scan showed stable appearance and CT angiogram did not show any aneurysms on AV malformations.tiny 1-2 mm aneurysm versus infundibulum was noted involving the basilar artery tip which was an incidental finding MRI scan of the brain showed a remote age blood products in the left parietal region on the gradient echo images and neurological interpretation was also late subacute to chronic small infarct in the left parietal region. Patient was started on Depakote for headache which improved.she was divided by physical, speech and occupational therapy and there were no needs identified at the time of dischargeEEG was obtained which showed no seizure activity. Vasculitis labs were sent which were normal.lipid panel showed elevated triglycerides of 335.0.  Consults: physical,  occupational and speech therapy  Significant Diagnostic Studies: CT scan of the head, CT angiogram of the head and neck, MRI scan of the brain  Treatments: IV hydration, analgesia: , therapies:  and procedures:   Discharge Exam:normal nonfocal neurological exam without any deficits.   Disposition: home  Patient Instructions: advice to increase activities slowly. Stop aspirin for 2 weeks and then restart it. Take Depakote ER 500 daily for headache Current Discharge Medication List    START taking these medications   Details  DEPAKOTE ER 500 MG 24 hr tablet Take 1 tablet (500 mg total) by mouth daily. Qty: 90 tablet, Refills: 1      CONTINUE these medications which have NOT CHANGED   Details  bisoprolol (ZEBETA) 5 MG tablet Take 5 mg by mouth daily.     fenofibrate (TRICOR) 48 MG tablet Take 48 mg by mouth daily.     glimepiride (AMARYL) 1 MG tablet Take 2 mg by mouth daily.     lisinopril (PRINIVIL,ZESTRIL) 10 MG tablet Take 10 mg by mouth daily.     lovastatin (MEVACOR) 40 MG tablet Take 40 mg by mouth daily. Take 2 tabs daily     metFORMIN (GLUMETZA) 500 MG (MOD) 24 hr tablet Take 1,000 mg by mouth daily.     ALPRAZolam (XANAX) 0.5 MG tablet Take 0.5 mg by mouth as directed. for anxiety       STOP taking these medications     aspirin 325 MG tablet         Signed:  Antony Contras, MD

## 2010-12-01 ENCOUNTER — Encounter: Payer: Self-pay | Admitting: *Deleted

## 2010-12-01 LAB — COMPLEMENT, TOTAL: Compl, Total (CH50): >60 U/mL — ABNORMAL HIGH (ref 31–60)

## 2010-12-02 ENCOUNTER — Encounter: Payer: Self-pay | Admitting: Family Medicine

## 2010-12-03 ENCOUNTER — Ambulatory Visit (INDEPENDENT_AMBULATORY_CARE_PROVIDER_SITE_OTHER): Payer: Medicare Other | Admitting: Family Medicine

## 2010-12-03 ENCOUNTER — Encounter: Payer: Self-pay | Admitting: Family Medicine

## 2010-12-03 VITALS — BP 135/75 | HR 80 | Temp 97.6°F | Ht 60.5 in | Wt 148.4 lb

## 2010-12-03 DIAGNOSIS — I609 Nontraumatic subarachnoid hemorrhage, unspecified: Secondary | ICD-10-CM

## 2010-12-03 MED ORDER — ALPRAZOLAM 0.5 MG PO TABS: 0.5000 mg | ORAL_TABLET | ORAL | Status: DC

## 2010-12-03 MED ORDER — FENOFIBRATE 48 MG PO TABS: 48.0000 mg | ORAL_TABLET | Freq: Every day | ORAL | Status: DC

## 2010-12-03 MED ORDER — GLIMEPIRIDE 1 MG PO TABS: ORAL_TABLET | ORAL | Status: DC

## 2010-12-03 NOTE — Patient Instructions (Signed)
Follow up as scheduled for your complete physical You look great!!!  I'm so glad you're feeling better! Call with any questions or concerns Happy Thanksgiving!!!

## 2010-12-03 NOTE — Progress Notes (Signed)
  Subjective:    Patient ID: Sandra [name], female    DOB: April 08, 1933, 77 y.o.   MRN: [id number]  HPI SAH- L parietal, occurred on 11/8, spontaneously.  Saw neuro in the hospital- has f/u scheduled in 2 months.  MRA showed no concerning areas or aneurysms.  Wants to review meds- is confused by instructions.  Wants to know if she's at higher risk for another since she's already had one.  Denies HA, weakness, numbness, dizziness.  Feels thoughts are 'slower'.   Review of Systems For ROS see HPI     Objective:   Physical Exam  Vitals reviewed. Constitutional: She is oriented to person, place, and time. She appears well-developed and well-nourished. No distress.  HENT:  Head: Normocephalic and atraumatic.  Eyes: Conjunctivae and EOM are normal. Pupils are equal, round, and reactive to light.  Neck: Normal range of motion. Neck supple. No thyromegaly present.  Cardiovascular: Normal rate, regular rhythm, normal heart sounds and intact distal pulses.   No murmur heard. Pulmonary/Chest: Effort normal and breath sounds normal. No respiratory distress.  Abdominal: Soft. She exhibits no distension. There is no tenderness.  Musculoskeletal: She exhibits no edema.  Lymphadenopathy:    She has no cervical adenopathy.  Neurological: She is alert and oriented to person, place, and time. She has normal reflexes. No cranial nerve deficit. Coordination normal.  Skin: Skin is warm and dry.  Psychiatric: She has a normal mood and affect. Her behavior is normal.          Assessment & Plan:

## 2010-12-07 NOTE — Assessment & Plan Note (Signed)
Pt had SAH 1 week ago.  No residual deficits.  Had extensive w/u while hospitalized- no evidence of additional aneurysms that would put her at higher risk of recurrence.  Reviewed med list w/ pt- she is now clear on what to take.  Has f/u w/ neuro pending.  Goal will be to minimize future risk by tightly controlling risk factors (DM, HTN, lipids).  Pt expressed understanding and is in agreement w/ plan.

## 2010-12-08 NOTE — Progress Notes (Signed)
Utilization review completed. Rozanna Boer, RN, BSN. 12/08/10

## 2010-12-09 ENCOUNTER — Ambulatory Visit (INDEPENDENT_AMBULATORY_CARE_PROVIDER_SITE_OTHER): Payer: Medicare Other

## 2010-12-09 DIAGNOSIS — Z23 Encounter for immunization: Secondary | ICD-10-CM

## 2011-01-07 ENCOUNTER — Ambulatory Visit (INDEPENDENT_AMBULATORY_CARE_PROVIDER_SITE_OTHER): Payer: Medicare Other | Admitting: Family Medicine

## 2011-01-07 ENCOUNTER — Encounter: Payer: Self-pay | Admitting: Family Medicine

## 2011-01-07 DIAGNOSIS — Z0000 Encounter for general adult medical examination without abnormal findings: Secondary | ICD-10-CM

## 2011-01-07 DIAGNOSIS — E119 Type 2 diabetes mellitus without complications: Secondary | ICD-10-CM

## 2011-01-07 DIAGNOSIS — M810 Age-related osteoporosis without current pathological fracture: Secondary | ICD-10-CM

## 2011-01-07 DIAGNOSIS — I10 Essential (primary) hypertension: Secondary | ICD-10-CM

## 2011-01-07 DIAGNOSIS — E785 Hyperlipidemia, unspecified: Secondary | ICD-10-CM

## 2011-01-07 LAB — BASIC METABOLIC PANEL
BUN: 22 mg/dL (ref 6–23)
Calcium: 8.9 mg/dL (ref 8.4–10.5)
Creatinine, Ser: 1.1 mg/dL (ref 0.4–1.2)
GFR: 49.89 mL/min — ABNORMAL LOW (ref >60.00)
Glucose, Bld: 175 mg/dL — ABNORMAL HIGH (ref 70–99)

## 2011-01-07 LAB — CBC WITH DIFFERENTIAL/PLATELET
Basophils Relative: 0.3 % (ref 0.0–3.0)
Eosinophils Relative: 0.6 % (ref 0.0–5.0)
HCT: 30.5 % — ABNORMAL LOW (ref 36.0–46.0)
Lymphs Abs: 1.8 10*3/uL (ref 0.7–4.0)
MCV: 97.3 fl (ref 78.0–100.0)
Monocytes Absolute: 1.3 10*3/uL — ABNORMAL HIGH (ref 0.1–1.0)
RBC: 3.13 Mil/uL — ABNORMAL LOW (ref 3.87–5.11)
WBC: 14.3 10*3/uL — ABNORMAL HIGH (ref 4.5–10.5)

## 2011-01-07 LAB — LIPID PANEL
HDL: 41.6 mg/dL (ref >39.00)
Triglycerides: 250 mg/dL — ABNORMAL HIGH (ref 0.0–149.0)

## 2011-01-07 LAB — HEPATIC FUNCTION PANEL: Albumin: 3.9 g/dL (ref 3.5–5.2)

## 2011-01-07 LAB — TSH: TSH: 1.69 u[IU]/mL (ref 0.35–5.50)

## 2011-01-07 NOTE — Patient Instructions (Signed)
Follow up in 6 months to recheck blood pressure and cholesterol We'll notify you of your lab results and fax a copy to Dr Chalmers Cater Someone will call you with your bone density appt Call with any questions or concerns Keep up the good work!  You look good! Happy belated birthday! Happy Holidays!

## 2011-01-07 NOTE — Progress Notes (Signed)
  Subjective:    Patient ID: Sandra [name], female    DOB: [birth year], 77 y.o.   MRN: [id number]  HPI Here today for CPE.  Risk Factors: DM- saw Endo last week, A1C was 8.8.  meds were changed.  Glimepiride was increased to 2mg , has f/u in 3 weeks. HTN- chronic problem, well controlled today.  On Lisinopril and bisoprolol daily.  No CP, SOB, HAs, visual changes, edema.  + fatigue. Hyperlipidemia- chronic problem, on Lovastatin and Tricor.  Denies abd pain, N/V, myalgias Physical Activity: regular activity but not exercising Fall Risk: low risk, steady on feet Depression: chronic problem, taking meds regularly. Hearing: normal to conversational tones, decreased to whispered voice ADL's: independent Cognitive: normal linear thought process, memory and attention intact Home Safety: lives w/ daughter and grand daughter Height, Weight, BMI, Visual Acuity: see vitals, vision corrected to 20/20 w/ glasses Counseling: UTD on mammo and colonoscopy (2009).  Overdue on DEXA. Labs Ordered: See A&P Care Plan: See A&P    Review of Systems Patient reports no vision/ hearing changes, adenopathy,fever, weight change,  persistant/recurrent hoarseness , swallowing issues, chest pain, palpitations, edema, persistant/recurrent cough, hemoptysis, dyspnea (rest/exertional/paroxysmal nocturnal), gastrointestinal bleeding (melena, rectal bleeding), abdominal pain, significant heartburn, bowel changes, GU symptoms (dysuria, hematuria, incontinence), Gyn symptoms (abnormal  bleeding, pain),  syncope, focal weakness, memory loss, numbness & tingling, skin/hair/nail changes, abnormal bruising or bleeding.     Objective:   Physical Exam General Appearance:    Alert, cooperative, no distress, appears stated age  Head:    Normocephalic, without obvious abnormality, atraumatic  Eyes:    PERRL, conjunctiva/corneas clear, EOM's intact, fundi    benign, both eyes  Ears:    Normal TM's and external ear canals, both  ears  Nose:   Nares normal, septum midline, mucosa normal, no drainage    or sinus tenderness  Throat:   Lips, mucosa, and tongue normal; teeth and gums normal  Neck:   Supple, symmetrical, trachea midline, no adenopathy;    Thyroid: no enlargement/tenderness/nodules  Back:     Symmetric, no curvature, ROM normal, no CVA tenderness  Lungs:     Clear to auscultation bilaterally, respirations unlabored  Chest Wall:    No tenderness or deformity   Heart:    Regular rate and rhythm, S1 and S2 normal, no murmur, rub   or gallop  Breast Exam:    Deferred  Abdomen:     Soft, non-tender, bowel sounds active all four quadrants,    no masses, no organomegaly  Genitalia:    Deferred  Rectal:    Extremities:   Extremities normal, atraumatic, no cyanosis or edema  Pulses:   2+ and symmetric all extremities  Skin:   Skin color, texture, turgor normal, no rashes or lesions  Lymph nodes:   Cervical, supraclavicular, and axillary nodes normal  Neurologic:   CNII-XII intact, normal strength, sensation and reflexes    throughout          Assessment & Plan:

## 2011-01-10 LAB — VITAMIN D 1,25 DIHYDROXY: Vitamin D3 1, 25 (OH)2: 22 pg/mL

## 2011-01-21 ENCOUNTER — Other Ambulatory Visit: Payer: Self-pay | Admitting: Family Medicine

## 2011-01-21 ENCOUNTER — Encounter: Payer: Self-pay | Admitting: *Deleted

## 2011-01-22 ENCOUNTER — Other Ambulatory Visit (INDEPENDENT_AMBULATORY_CARE_PROVIDER_SITE_OTHER): Payer: Medicare Other

## 2011-01-22 DIAGNOSIS — D7289 Other specified disorders of white blood cells: Secondary | ICD-10-CM

## 2011-01-22 DIAGNOSIS — R350 Frequency of micturition: Secondary | ICD-10-CM

## 2011-01-22 LAB — POCT URINALYSIS DIPSTICK
Bilirubin, UA: NEGATIVE
Ketones, UA: NEGATIVE
Protein, UA: NEGATIVE
Spec Grav, UA: 1.02

## 2011-01-22 LAB — CBC WITH DIFFERENTIAL/PLATELET
Basophils Relative: 0.4 % (ref 0.0–3.0)
Eosinophils Relative: 2.1 % (ref 0.0–5.0)
HCT: 36.2 % (ref 36.0–46.0)
MCV: 97.9 fl (ref 78.0–100.0)
Monocytes Absolute: 0.5 10*3/uL (ref 0.1–1.0)
Neutrophils Relative %: 52.4 % (ref 43.0–77.0)
RBC: 3.7 Mil/uL — ABNORMAL LOW (ref 3.87–5.11)
WBC: 5.6 10*3/uL (ref 4.5–10.5)

## 2011-01-25 NOTE — Assessment & Plan Note (Signed)
Pt's PE WNL.  UTD on mammo and colonoscopy.  Check labs.  Anticipatory guidance provided.

## 2011-01-25 NOTE — Assessment & Plan Note (Signed)
Chronic problem.  Well controlled.  Asymptomatic.  No changes at this time.

## 2011-01-25 NOTE — Assessment & Plan Note (Signed)
Chronic problem.  Tolerating statin w/out difficulty.  Check labs.

## 2011-01-25 NOTE — Assessment & Plan Note (Signed)
Overdue for DEXA.  Check Vit D.  Determine next steps based on study results

## 2011-02-12 ENCOUNTER — Ambulatory Visit
Admission: RE | Admit: 2011-02-12 | Discharge: 2011-02-12 | Disposition: A | Discharge status: Home / Self Care | Payer: Medicare Other | Source: Ambulatory Visit | Attending: Family Medicine | Admitting: Family Medicine

## 2011-02-12 DIAGNOSIS — M810 Age-related osteoporosis without current pathological fracture: Secondary | ICD-10-CM

## 2011-03-24 ENCOUNTER — Other Ambulatory Visit: Payer: Self-pay | Admitting: Neurology

## 2011-03-24 DIAGNOSIS — I639 Cerebral infarction, unspecified: Secondary | ICD-10-CM

## 2011-03-24 DIAGNOSIS — I609 Nontraumatic subarachnoid hemorrhage, unspecified: Secondary | ICD-10-CM

## 2011-03-28 ENCOUNTER — Ambulatory Visit
Admission: RE | Admit: 2011-03-28 | Discharge: 2011-03-28 | Disposition: A | Discharge status: Home / Self Care | Payer: Medicare Other | Source: Ambulatory Visit | Attending: Neurology | Admitting: Neurology

## 2011-03-28 DIAGNOSIS — I639 Cerebral infarction, unspecified: Secondary | ICD-10-CM

## 2011-03-28 DIAGNOSIS — I609 Nontraumatic subarachnoid hemorrhage, unspecified: Secondary | ICD-10-CM

## 2011-03-28 MED ORDER — GADOBENATE DIMEGLUMINE 529 MG/ML IV SOLN
7.0000 mL | Freq: Once | INTRAVENOUS | Status: AC | PRN
  Administered 2011-03-28: 7 mL via INTRAVENOUS

## 2011-04-22 ENCOUNTER — Telehealth: Payer: Self-pay | Admitting: *Deleted

## 2011-04-22 NOTE — Telephone Encounter (Signed)
Spoke to pt to advise results/instructions. Pt understood concerning recent imaging for DXA results, noted pt has osteopenia and needs to start taking Ca+ and Vit D repeat test in 2 years, pt understood

## 2011-04-28 ENCOUNTER — Encounter: Payer: Self-pay | Admitting: Family Medicine

## 2011-04-29 ENCOUNTER — Telehealth: Payer: Self-pay | Admitting: Family Medicine

## 2011-04-29 NOTE — Telephone Encounter (Signed)
Refill: xanax 0.5 mg tab. Take 1 tablet by mouth as directed for anxiety. Qty 21. Last fill 03-23-11

## 2011-04-30 MED ORDER — ALPRAZOLAM 0.5 MG PO TABS: 0.5000 mg | ORAL_TABLET | ORAL | Status: DC

## 2011-04-30 NOTE — Telephone Encounter (Signed)
[facility] for #45, 1 refill

## 2011-04-30 NOTE — Telephone Encounter (Signed)
.  rx faxed to pharmacy, manually.  

## 2011-04-30 NOTE — Telephone Encounter (Signed)
Last OV 01-07-11 last refill 12-03-10 #45 with 1 refill

## 2011-10-06 ENCOUNTER — Encounter: Payer: Self-pay | Admitting: Family Medicine

## 2011-11-02 ENCOUNTER — Encounter: Payer: Self-pay | Admitting: Family Medicine

## 2011-11-02 ENCOUNTER — Ambulatory Visit (INDEPENDENT_AMBULATORY_CARE_PROVIDER_SITE_OTHER): Payer: Medicare Other | Admitting: Family Medicine

## 2011-11-02 VITALS — BP 121/74 | HR 69 | Temp 98.4°F | Ht 60.0 in | Wt 137.8 lb

## 2011-11-02 DIAGNOSIS — N390 Urinary tract infection, site not specified: Secondary | ICD-10-CM

## 2011-11-02 HISTORY — DX: Urinary tract infection, site not specified: N39.0

## 2011-11-02 LAB — POCT URINALYSIS DIPSTICK
Bilirubin, UA: NEGATIVE
Glucose, UA: 500
Nitrite, UA: POSITIVE
Urobilinogen, UA: 0.2

## 2011-11-02 MED ORDER — CEPHALEXIN 500 MG PO CAPS: 500.0000 mg | ORAL_CAPSULE | Freq: Two times a day (BID) | ORAL | Status: AC

## 2011-11-02 MED ORDER — ALPRAZOLAM 0.5 MG PO TABS: 0.5000 mg | ORAL_TABLET | ORAL | Status: DC

## 2011-11-02 NOTE — Progress Notes (Signed)
  Subjective:    Patient ID: Sandra [name], female    DOB: 03/17/1933, 78 y.o.   MRN: [id number]  HPI Dysuria- sxs started 4-5 days ago.  Increased frequency, urgency, hesitancy.  + cloudy urine.  No fevers.  Some R sided low back pain.  Hx of similar.   Review of Systems For ROS see HPI     Objective:   Physical Exam  Vitals reviewed. Constitutional: She appears well-developed and well-nourished. No distress.  Abdominal: Soft. She exhibits no distension. There is no tenderness (no suprapubic or CVA tenderness).          Assessment & Plan:

## 2011-11-02 NOTE — Patient Instructions (Signed)
This appears to be a bladder infection Start the Keflex twice daily- take w/ food Drink plenty of fluids Call with any questions or concerns Happy Fall!!!

## 2011-11-02 NOTE — Assessment & Plan Note (Signed)
Pt's UA and sxs consistent w/ UTI.  Start abx.  Reviewed supportive care and red flags that should prompt return.  Pt expressed understanding and is in agreement w/ plan.

## 2011-11-05 LAB — URINE CULTURE: Colony Count: >=100000

## 2012-04-07 ENCOUNTER — Encounter: Payer: Self-pay | Admitting: Neurology

## 2012-04-07 DIAGNOSIS — I6350 Cerebral infarction due to unspecified occlusion or stenosis of unspecified cerebral artery: Secondary | ICD-10-CM

## 2012-04-07 DIAGNOSIS — Z8679 Personal history of other diseases of the circulatory system: Secondary | ICD-10-CM | POA: Insufficient documentation

## 2012-04-07 DIAGNOSIS — I609 Nontraumatic subarachnoid hemorrhage, unspecified: Secondary | ICD-10-CM

## 2012-04-07 HISTORY — DX: Cerebral infarction due to unspecified occlusion or stenosis of unspecified cerebral artery: I63.50

## 2012-04-27 ENCOUNTER — Encounter: Payer: Self-pay | Admitting: Neurology

## 2012-04-27 ENCOUNTER — Ambulatory Visit (INDEPENDENT_AMBULATORY_CARE_PROVIDER_SITE_OTHER): Payer: Medicare Other | Admitting: Neurology

## 2012-04-27 ENCOUNTER — Inpatient Hospital Stay
Admission: RE | Admit: 2012-04-27 | Discharge: 2012-04-27 | Disposition: A | Discharge status: Home / Self Care | Payer: Self-pay | Source: Ambulatory Visit | Attending: Neurology | Admitting: Neurology

## 2012-04-27 VITALS — BP 125/71 | HR 63 | Temp 97.8°F | Ht 61.0 in | Wt 140.0 lb

## 2012-04-27 DIAGNOSIS — I6789 Other cerebrovascular disease: Secondary | ICD-10-CM

## 2012-04-27 NOTE — Progress Notes (Signed)
[facility] [address] [place]. [place] [address] [phone]       OFFICE FOLLOW-UP NOTE  Sandra [name] Date of Birth:  April 20, 1933 Medical Record Number:  [id number]   HPI: Sandra [name] is a 79 year old Caucasian lady with history of small localized left parietal subarachnoid hemorrhage in November 2012 possibly from reversible vasoconstriction syndrome with multiple vascular risk factors of diabetes, hypertension and hyperlipidemia.  She returns for followup after her last visit on 11/17/11. She continues to do well without recurrence stroke or TIA symptoms. She has had no episodes of seizure and has been off Depakote now for quite some time. She states her blood pressure is under excellent control and it is 125/71 in office today. She had hemoglobin A1c checked in February by Dr. Chalmers Cater and it was 8. Her lipid profile at that time apparently was normal. She has no new complaints today. ROS:   14 system review of systems is positive for fatigue, itching and runny nose only. PMH:  Past Medical History  Diagnosis Date  . Depression   . GERD (gastroesophageal reflux disease)   . Hyperlipidemia   . Diabetes mellitus     type 2  . Hypertension   . Osteopenia   . Allergic rhinitis   . Takotsubo cardiomyopathy     with subsequent normalization of left ventricular systolic function  . Hyperlipidemia   . Anxiety   . Acute MI   . Valvular heart disease   . Acid reflux   . Degenerative joint disease   . Neck pain, chronic   . Sciatica   . Gait disturbance   . SAH (subarachnoid hemorrhage)     Social History:  History   Social History  . Marital Status: Divorced    Spouse Name: N/A    Number of Children: 2  . Years of Education: N/A   Occupational History  . Not on file.   Social History Main Topics  . Smoking status: Never Smoker   . Smokeless tobacco: Never Used  . Alcohol Use: No  . Drug Use: No  . Sexually Active:    Other Topics Concern   . Not on file   Social History Narrative   Lives with her daughter and G-daughter   Diet- working on portion control   Exercise- no routine exercise but active    Medications:   Current Outpatient Prescriptions on File Prior to Visit  Medication Sig Dispense Refill  . ALPRAZolam (XANAX) 0.5 MG tablet Take 1 tablet (0.5 mg total) by mouth as directed. for anxiety  45 tablet  1  . aspirin 81 MG tablet Take 81 mg by mouth daily.      . bisoprolol (ZEBETA) 5 MG tablet Take 5 mg by mouth daily.       . citalopram (CELEXA) 10 MG tablet Take 20 mg by mouth daily.      . fenofibrate (TRICOR) 48 MG tablet Take 1 tablet (48 mg total) by mouth daily.  90 tablet  3  . glimepiride (AMARYL) 1 MG tablet 2 mg daily before breakfast. Take 3 tabs daily       . lisinopril (PRINIVIL,ZESTRIL) 10 MG tablet Take 10 mg by mouth daily.       Marland Kitchen lovastatin (MEVACOR) 40 MG tablet Take 40 mg by mouth daily. Take 2 tabs daily       . metFORMIN (GLUMETZA) 500 MG (MOD) 24 hr tablet Take 1,000 mg by mouth daily.  No current facility-administered medications on file prior to visit.    Allergies:   Allergies  Allergen Reactions  . Pioglitazone     REACTION: EDEMA    Physical Exam General: well developed, well nourished, seated, in no evident distress Head: head normocephalic and atraumatic. Orohparynx benign Neck: supple with no carotid or supraclavicular bruits Cardiovascular: regular rate and rhythm, no murmurs Musculoskeletal: no deformity Skin:  no rash/petichiae Vascular:  Normal pulses all extremities  Neurologic Exam Mental Status: Awake and fully alert. Oriented to place and time. Recent and remote memory intact. Attention span, concentration and fund of knowledge appropriate. Mood and affect appropriate.  Cranial Nerves: Fundoscopic exam reveals sharp disc margins. Pupils equal, briskly reactive to light. Extraocular movements full without nystagmus. Visual fields full to confrontation.  Hearing intact. Facial sensation intact. Face, tongue, palate moves normally and symmetrically.  Motor: Normal bulk and tone. Normal strength in all tested extremity muscles. Sensory.: intact to tough and pinprick and vibratory.  Coordination: Rapid alternating movements normal in all extremities. Finger-to-nose and heel-to-shin performed accurately bilaterally. Gait and Station: Arises from chair without difficulty. Stance is normal. Gait demonstrates normal stride length and balance . Not able to heel, toe and tandem walk without difficulty.  Reflexes: 1+ and symmetric. Toes downgoing.     ASSESSMENT: 79 year old Caucasian lady with small localized left parietal subarachnoid hemorrhage in November 2012 likely from reversible vasoconstriction syndrome. Vascular risk factors of diabetes, hypertension and hyperlipidemia.    PLAN: Continue aspirin for stroke prevention with strict control of diabetes with hemoglobin A1c goal below 6.5%, hypertension with goal below 120/80 and lipids the LDL cholesterol goal below 70 mg percent. Check followup carotid Doppler studies. Return for followup in 6 months with nurse practitioner

## 2012-04-27 NOTE — Patient Instructions (Signed)
She was advised to continue aspirin for stroke prevention and maintain strict control of hypertension with blood pressure goal below 120/80, lipids with LDL cholesterol goal below 70 mg percent and diabetes with hemoglobin A1c goal below 6.5%. Check followup carotid Doppler studies. Return for followup in 6 months with nurse practitioner.

## 2012-05-20 ENCOUNTER — Other Ambulatory Visit: Payer: Self-pay | Admitting: Neurology

## 2012-05-20 DIAGNOSIS — I6350 Cerebral infarction due to unspecified occlusion or stenosis of unspecified cerebral artery: Secondary | ICD-10-CM

## 2012-05-24 ENCOUNTER — Ambulatory Visit (INDEPENDENT_AMBULATORY_CARE_PROVIDER_SITE_OTHER): Payer: Medicare Other

## 2012-05-24 DIAGNOSIS — I6350 Cerebral infarction due to unspecified occlusion or stenosis of unspecified cerebral artery: Secondary | ICD-10-CM

## 2012-06-06 ENCOUNTER — Telehealth: Payer: Self-pay | Admitting: *Deleted

## 2012-06-06 NOTE — Telephone Encounter (Signed)
error 

## 2012-06-06 NOTE — Telephone Encounter (Signed)
Called and left voicemail message.

## 2012-09-28 ENCOUNTER — Ambulatory Visit (INDEPENDENT_AMBULATORY_CARE_PROVIDER_SITE_OTHER): Payer: Medicare Other | Admitting: Family Medicine

## 2012-09-28 ENCOUNTER — Encounter: Payer: Self-pay | Admitting: Family Medicine

## 2012-09-28 VITALS — BP 118/68 | HR 66 | Temp 97.9°F | Wt 142.2 lb

## 2012-09-28 DIAGNOSIS — S00412A Abrasion of left ear, initial encounter: Secondary | ICD-10-CM

## 2012-09-28 DIAGNOSIS — L988 Other specified disorders of the skin and subcutaneous tissue: Secondary | ICD-10-CM

## 2012-09-28 DIAGNOSIS — L959 Vasculitis limited to the skin, unspecified: Secondary | ICD-10-CM

## 2012-09-28 DIAGNOSIS — IMO0002 Reserved for concepts with insufficient information to code with codable children: Secondary | ICD-10-CM

## 2012-09-28 DIAGNOSIS — S00419A Abrasion of unspecified ear, initial encounter: Secondary | ICD-10-CM

## 2012-09-28 HISTORY — DX: Abrasion of unspecified ear, initial encounter: S00.419A

## 2012-09-28 HISTORY — DX: Vasculitis limited to the skin, unspecified: L95.9

## 2012-09-28 LAB — HEPATIC FUNCTION PANEL
Albumin: 4.1 g/dL (ref 3.5–5.2)
Alkaline Phosphatase: 47 U/L (ref 39–117)
Bilirubin, Direct: 0 mg/dL (ref 0.0–0.3)
Total Protein: 7 g/dL (ref 6.0–8.3)

## 2012-09-28 LAB — CBC WITH DIFFERENTIAL/PLATELET
Basophils Absolute: 0 10*3/uL (ref 0.0–0.1)
HCT: 34 % — ABNORMAL LOW (ref 36.0–46.0)
Hemoglobin: 11.8 g/dL — ABNORMAL LOW (ref 12.0–15.0)
Lymphs Abs: 1.9 10*3/uL (ref 0.7–4.0)
MCV: 94.6 fl (ref 78.0–100.0)
Monocytes Absolute: 0.4 10*3/uL (ref 0.1–1.0)
Monocytes Relative: 6.2 % (ref 3.0–12.0)
Neutro Abs: 4 10*3/uL (ref 1.4–7.7)
Platelets: 284 10*3/uL (ref 150.0–400.0)
RDW: 11.9 % (ref 11.5–14.6)

## 2012-09-28 LAB — BASIC METABOLIC PANEL
CO2: 28 mEq/L (ref 19–32)
Calcium: 9.5 mg/dL (ref 8.4–10.5)
Creatinine, Ser: 1.4 mg/dL — ABNORMAL HIGH (ref 0.4–1.2)
GFR: 38.78 mL/min — ABNORMAL LOW (ref >60.00)
Glucose, Bld: 338 mg/dL — ABNORMAL HIGH (ref 70–99)
Sodium: 136 mEq/L (ref 135–145)

## 2012-09-28 LAB — C-REACTIVE PROTEIN: CRP: 1 mg/dL (ref 0.5–20.0)

## 2012-09-28 MED ORDER — PREDNISONE 10 MG PO TABS: ORAL_TABLET | ORAL | Status: DC

## 2012-09-28 MED ORDER — ALPRAZOLAM 0.5 MG PO TABS: 0.5000 mg | ORAL_TABLET | ORAL | Status: DC

## 2012-09-28 NOTE — Assessment & Plan Note (Signed)
New.  Pt w/ vasculitic changes on upper back and trunk at site of pt's itching.  Start prednisone to improve sxs.  Check labs to attempt to determine cause.  Pt denies meningeal sxs- no HA, fever, stiff neck.  Reviewed supportive care and red flags that should prompt return.  Pt expressed understanding and is in agreement w/ plan.

## 2012-09-28 NOTE — Patient Instructions (Addendum)
Please schedule your complete physical at your convenience We'll notify you of your lab results and make any changes if needed Start the Prednisone as directed- take w/ food The blood in the ear is from a small scrape- there is nothing to do about this but try not to pick or scratch If no improvement in the itching or rash, please call Hang in there!

## 2012-09-28 NOTE — Assessment & Plan Note (Signed)
New.  Reviewed dx w/ pt and benign nature.  No need for intervention or fu.  Encouraged her to avoid scratching or picking at ears

## 2012-09-28 NOTE — Progress Notes (Signed)
  Subjective:    Patient ID: Sandra [name], female    DOB: 08/11/1933, 80 y.o.   MRN: [id number]  HPI Itching- started 1 yr ago.  Went to derm (Dr Jarome Matin).  Was given topical medication.  Used peroxide on scalp that cleared up the 'bumps'.  Now has 'bumps' on her upper back.  L ear was itching last night and after wetting qtip noted there was blood on it.  Itching has improved since changing laundry detergent and soap.  No ear pain.  No fevers.  No neck stiffness, HA, N/V.   Review of Systems For ROS see HPI     Objective:   Physical Exam  Vitals reviewed. Constitutional: She is oriented to person, place, and time. She appears well-developed and well-nourished. No distress.  HENT:  Head: Normocephalic and atraumatic.  TMs WNL, small abrasion in L EAC w/out active drainage  Neck: Normal range of motion. Neck supple.  Lymphadenopathy:    She has no cervical adenopathy.  Neurological: She is alert and oriented to person, place, and time. No cranial nerve deficit. Coordination normal.  Skin: Skin is warm and dry. No erythema.  Diffuse, non-blanching, vasculitis on upper back and abdomen at site of pt's itching  Psychiatric: She has a normal mood and affect. Her behavior is normal.          Assessment & Plan:

## 2012-09-30 ENCOUNTER — Other Ambulatory Visit: Payer: Self-pay

## 2012-09-30 MED ORDER — CITALOPRAM HYDROBROMIDE 10 MG PO TABS: 20.0000 mg | ORAL_TABLET | Freq: Every day | ORAL | Status: DC

## 2012-10-13 ENCOUNTER — Telehealth: Payer: Self-pay | Admitting: Family Medicine

## 2012-10-13 NOTE — Telephone Encounter (Signed)
Patient is calling about her lab results and blood sugar levels from her last visit. Please advise.

## 2012-10-13 NOTE — Telephone Encounter (Signed)
Pt was advised she needed to speak to endocrinology asap complaining of sugars in the 600's

## 2012-10-19 ENCOUNTER — Encounter: Payer: Self-pay | Admitting: Neurology

## 2012-10-19 ENCOUNTER — Ambulatory Visit (INDEPENDENT_AMBULATORY_CARE_PROVIDER_SITE_OTHER): Payer: Medicare Other | Admitting: Neurology

## 2012-10-19 VITALS — BP 107/62 | HR 6 | Temp 98.3°F | Ht 61.0 in | Wt 139.0 lb

## 2012-10-19 DIAGNOSIS — R51 Headache: Secondary | ICD-10-CM

## 2012-10-19 NOTE — Patient Instructions (Addendum)
Continue aspirin for stroke prevention and strict control of diabetes with hemoglobin A1c goal below 6.5%, hypertension with blood pressure goal below 120/80 and lipids with LDL cholesterol goal below 70 mg percent. No routine followup appointment is necessary with me. She may be referred back by her primary physician in the future if necessary.

## 2012-10-19 NOTE — Progress Notes (Signed)
[facility] [address] [place]. [place] [address] [phone]       OFFICE FOLLOW-UP NOTE  [name] Date of Birth:  10-15-1933 Medical Record Number:  [id number]   HPI: Sandra [name] is a 79 year old Caucasian lady with history of small localized left parietal subarachnoid hemorrhage in November 2012 possibly from reversible vasoconstriction syndrome with multiple vascular risk factors of diabetes, hypertension and hyperlipidemia.  She returns for followup after her last visit on 11/17/11. She continues to do well without recurrence stroke or TIA symptoms. She has had no episodes of seizure and has been off Depakote now for quite some time. She states her blood pressure is under excellent control and it is 125/71 in office today. She had hemoglobin A1c checked in February by Dr. Chalmers Cater and it was 8. Her lipid profile at that time apparently was normal.  Update 10/20/12 .She returns for f/u after last visit 04/27/12. She continues to do well without any seizures,  or focal neurological symptoms.She was recently given a short course of steroids for a skin rash which led to increase in sugars and Dr Chalmers Cater has started her on insulin.She had a fall in her bathroom recently when she slipped and hit back of her head but without loss of consciousness or visit to ER. ROS:   14 system review of systems is positive for headache,  itching and runny nose only. PMH:  Past Medical History  Diagnosis Date  . Depression   . GERD (gastroesophageal reflux disease)   . Hyperlipidemia   . Diabetes mellitus     type 2  . Hypertension   . Osteopenia   . Allergic rhinitis   . Takotsubo cardiomyopathy     with subsequent normalization of left ventricular systolic function  . Hyperlipidemia   . Anxiety   . Acute MI   . Valvular heart disease   . Acid reflux   . Degenerative joint disease   . Neck pain, chronic   . Sciatica   . Gait disturbance   . SAH (subarachnoid hemorrhage)      Social History:  History   Social History  . Marital Status: Divorced    Spouse Name: N/A    Number of Children: 2  . Years of Education: N/A   Occupational History  . Not on file.   Social History Main Topics  . Smoking status: Never Smoker   . Smokeless tobacco: Never Used  . Alcohol Use: No  . Drug Use: No  . Sexual Activity:    Other Topics Concern  . Not on file   Social History Narrative   Lives with her daughter and G-daughter   Diet- working on portion control   Exercise- no routine exercise but active    Medications:   Current Outpatient Prescriptions on File Prior to Visit  Medication Sig Dispense Refill  . ALPRAZolam (XANAX) 0.5 MG tablet Take 1 tablet (0.5 mg total) by mouth as directed. for anxiety  45 tablet  1  . aspirin 81 MG tablet Take 81 mg by mouth daily.      . bisoprolol (ZEBETA) 5 MG tablet Take 5 mg by mouth daily.       . citalopram (CELEXA) 10 MG tablet Take 2 tablets (20 mg total) by mouth daily.  60 tablet  5  . fenofibrate (TRICOR) 48 MG tablet Take 1 tablet (48 mg total) by mouth daily.  90 tablet  3  . lisinopril (PRINIVIL,ZESTRIL) 10 MG tablet Take  10 mg by mouth daily.       Marland Kitchen lovastatin (MEVACOR) 40 MG tablet Take 20 mg by mouth daily. Take 1 tab daily      . glimepiride (AMARYL) 1 MG tablet 2 mg daily before breakfast. Take 3 tabs daily       . metFORMIN (GLUMETZA) 500 MG (MOD) 24 hr tablet Take 1,000 mg by mouth daily.        No current facility-administered medications on file prior to visit.    Allergies:   Allergies  Allergen Reactions  . Pioglitazone     REACTION: EDEMA   Filed Vitals:   10/19/12 1353  BP: 107/62  Pulse: 6  Temp: 98.3 F (36.8 C)     Physical Exam General: well developed, well nourished, seated, in no evident distress Head: head normocephalic and atraumatic. Orohparynx benign Neck: supple with no carotid or supraclavicular bruits Cardiovascular: regular rate and rhythm, no  murmurs Musculoskeletal: no deformity Skin:  no rash/petichiae Vascular:  Normal pulses all extremities  Neurologic Exam Mental Status: Awake and fully alert. Oriented to place and time. Recent and remote memory intact. Attention span, concentration and fund of knowledge appropriate. Mood and affect appropriate.  Cranial Nerves: Fundoscopic exam reveals sharp disc margins. Pupils equal, briskly reactive to light. Extraocular movements full without nystagmus. Visual fields full to confrontation. Hearing intact. Facial sensation intact. Face, tongue, palate moves normally and symmetrically.  Motor: Normal bulk and tone. Normal strength in all tested extremity muscles.mild bilateral UE action tremor Sensory.: intact to tough and pinprick and vibratory.  Coordination: Rapid alternating movements normal in all extremities. Finger-to-nose and heel-to-shin performed accurately bilaterally. Gait and Station: Arises from chair without difficulty. Stance is normal. Gait demonstrates normal stride length and balance . Not able to heel, toe and tandem walk without difficulty.  Reflexes: 1+ and symmetric. Toes downgoing.     ASSESSMENT: 79 year old Caucasian lady with small localized left parietal subarachnoid hemorrhage in November 2012 likely from reversible vasoconstriction syndrome. Vascular risk factors of diabetes, hypertension and hyperlipidemia.    PLAN: Continue aspirin for stroke prevention and strict control of diabetes with hemoglobin A1c goal below 6.5%, hypertension with blood pressure goal below 120/80 and lipids with LDL cholesterol goal below 70 mg percent. No routine followup appointment is necessary with me. She may be referred back by her primary physician in the future if necessary.

## 2012-12-21 ENCOUNTER — Encounter: Payer: Self-pay | Admitting: Family Medicine

## 2013-01-08 ENCOUNTER — Emergency Department (HOSPITAL_COMMUNITY): Payer: Medicare Other

## 2013-01-08 ENCOUNTER — Encounter (HOSPITAL_COMMUNITY): Payer: Self-pay | Admitting: Emergency Medicine

## 2013-01-08 ENCOUNTER — Emergency Department (HOSPITAL_COMMUNITY)
Admission: EM | Admit: 2013-01-08 | Discharge: 2013-01-08 | Disposition: A | Discharge status: Home / Self Care | Payer: Medicare Other | Attending: Emergency Medicine | Admitting: Emergency Medicine

## 2013-01-08 DIAGNOSIS — G5622 Lesion of ulnar nerve, left upper limb: Secondary | ICD-10-CM

## 2013-01-08 DIAGNOSIS — F329 Major depressive disorder, single episode, unspecified: Secondary | ICD-10-CM | POA: Insufficient documentation

## 2013-01-08 DIAGNOSIS — Z79899 Other long term (current) drug therapy: Secondary | ICD-10-CM | POA: Insufficient documentation

## 2013-01-08 DIAGNOSIS — I252 Old myocardial infarction: Secondary | ICD-10-CM | POA: Insufficient documentation

## 2013-01-08 DIAGNOSIS — Z8739 Personal history of other diseases of the musculoskeletal system and connective tissue: Secondary | ICD-10-CM | POA: Insufficient documentation

## 2013-01-08 DIAGNOSIS — Z8673 Personal history of transient ischemic attack (TIA), and cerebral infarction without residual deficits: Secondary | ICD-10-CM | POA: Insufficient documentation

## 2013-01-08 DIAGNOSIS — Z7982 Long term (current) use of aspirin: Secondary | ICD-10-CM | POA: Insufficient documentation

## 2013-01-08 DIAGNOSIS — G8929 Other chronic pain: Secondary | ICD-10-CM | POA: Insufficient documentation

## 2013-01-08 DIAGNOSIS — Z794 Long term (current) use of insulin: Secondary | ICD-10-CM | POA: Insufficient documentation

## 2013-01-08 DIAGNOSIS — Z8709 Personal history of other diseases of the respiratory system: Secondary | ICD-10-CM | POA: Insufficient documentation

## 2013-01-08 DIAGNOSIS — F411 Generalized anxiety disorder: Secondary | ICD-10-CM | POA: Insufficient documentation

## 2013-01-08 DIAGNOSIS — F3289 Other specified depressive episodes: Secondary | ICD-10-CM | POA: Insufficient documentation

## 2013-01-08 DIAGNOSIS — Z8719 Personal history of other diseases of the digestive system: Secondary | ICD-10-CM | POA: Insufficient documentation

## 2013-01-08 DIAGNOSIS — E785 Hyperlipidemia, unspecified: Secondary | ICD-10-CM | POA: Insufficient documentation

## 2013-01-08 DIAGNOSIS — I10 Essential (primary) hypertension: Secondary | ICD-10-CM | POA: Insufficient documentation

## 2013-01-08 DIAGNOSIS — E119 Type 2 diabetes mellitus without complications: Secondary | ICD-10-CM | POA: Insufficient documentation

## 2013-01-08 DIAGNOSIS — G5620 Lesion of ulnar nerve, unspecified upper limb: Secondary | ICD-10-CM | POA: Insufficient documentation

## 2013-01-08 DIAGNOSIS — R739 Hyperglycemia, unspecified: Secondary | ICD-10-CM

## 2013-01-08 DIAGNOSIS — N289 Disorder of kidney and ureter, unspecified: Secondary | ICD-10-CM | POA: Insufficient documentation

## 2013-01-08 DIAGNOSIS — R209 Unspecified disturbances of skin sensation: Secondary | ICD-10-CM | POA: Insufficient documentation

## 2013-01-08 HISTORY — DX: Transient cerebral ischemic attack, unspecified: G45.9

## 2013-01-08 LAB — COMPREHENSIVE METABOLIC PANEL
ALT: 12 U/L (ref 0–35)
AST: 15 U/L (ref 0–37)
Albumin: 3.9 g/dL (ref 3.5–5.2)
Alkaline Phosphatase: 64 U/L (ref 39–117)
CO2: 23 mEq/L (ref 19–32)
Chloride: 97 mEq/L (ref 96–112)
GFR calc non Af Amer: 38 mL/min — ABNORMAL LOW (ref >90)
Potassium: 4.8 mEq/L (ref 3.5–5.1)
Total Bilirubin: 0.3 mg/dL (ref 0.3–1.2)

## 2013-01-08 LAB — URINE MICROSCOPIC-ADD ON

## 2013-01-08 LAB — URINALYSIS, ROUTINE W REFLEX MICROSCOPIC
Bilirubin Urine: NEGATIVE
Glucose, UA: >1000 mg/dL — AB
Hgb urine dipstick: NEGATIVE
Ketones, ur: NEGATIVE mg/dL
Protein, ur: NEGATIVE mg/dL

## 2013-01-08 LAB — CBC
Platelets: 239 10*3/uL (ref 150–400)
RBC: 3.85 MIL/uL — ABNORMAL LOW (ref 3.87–5.11)
RDW: 11.7 % (ref 11.5–15.5)
WBC: 6.7 10*3/uL (ref 4.0–10.5)

## 2013-01-08 LAB — GLUCOSE, CAPILLARY
Glucose-Capillary: 411 mg/dL — ABNORMAL HIGH (ref 70–99)
Glucose-Capillary: 241 mg/dL — ABNORMAL HIGH (ref 70–99)

## 2013-01-08 MED ORDER — SODIUM CHLORIDE 0.9 % IV BOLUS (SEPSIS)
500.0000 mL | Freq: Once | INTRAVENOUS | Status: AC
  Administered 2013-01-08: 500 mL via INTRAVENOUS

## 2013-01-08 MED ORDER — INSULIN ASPART 100 UNIT/ML ~~LOC~~ SOLN
10.0000 [IU] | Freq: Once | SUBCUTANEOUS | Status: AC
  Administered 2013-01-08: 10 [IU] via INTRAVENOUS
  Filled 2013-01-08: qty 1

## 2013-01-08 NOTE — ED Provider Notes (Signed)
Medical screening examination/treatment/procedure(s) were conducted as a shared visit with non-physician practitioner(s) and myself.  I personally evaluated the patient during the encounter.  EKG Interpretation   None       Patient presents to the ER for evaluation of elevated blood sugars. Patient does have a history of diabetes. She recently had her medications this was switched to insulin. She felt that she was having severe back pain because of the insulin and was told to stop her insulin prior endocrinologist. She says she was told to restart her other meds and her blood sugar has been running high. She does not have any sign of DKA. Sugars were treated with fluids and insulin here in the ER with resolution.  Also complaining of some numbness in the fifth fingers. This appears to be peripheral neuropathy, and no other signs on examination I would be worrisome for CVA.  Orpah Greek, MD 01/08/13 [phone]

## 2013-01-08 NOTE — ED Notes (Signed)
Pt reports woke up with numbness to left fingers radiating up left arm. Pt also reports high blood sugar readings since 01/03/2013. Pt CBG at home today was at 0900 371, and at 1200 455. Pt seen by PMD on Friday and was told she was having problems with her kidneys since 12/2011.

## 2013-01-08 NOTE — ED Notes (Signed)
CBG when 500 ml bolus has infused per [place].

## 2013-01-08 NOTE — ED Provider Notes (Signed)
CSN: [id number]     Arrival date & time 01/08/13  1244 History   First MD Initiated Contact with Patient 01/08/13 1315     Chief Complaint  Patient presents with  . Numbness    Left arm  . Hyperglycemia   (Consider location/radiation/quality/duration/timing/severity/associated sxs/prior Treatment) HPI Comments: Pt states that she has been having high blood sugars over the last several months:pt states that in September her doctor switched her to insulin and she states that she was having severe back pain so two weeks ago her doctor told her to get off everything and follow up in 2 weeks:pt was 2 days ago and restarted on her glimepiride and tradjenta:pt states that her blood sugar has continued to be high:pt states that she was also told at her appointment that her kidney function is not good since 12/2011 and she had not previously heard that:pt state that she woke up in the middle of the night and she had numbness in the pinky and ring finger in her left hand:pt states that it has gotten better since she woke:pt denies speech problems, blurred vision  The history is provided by the patient. No language interpreter was used.    Past Medical History  Diagnosis Date  . Depression   . GERD (gastroesophageal reflux disease)   . Hyperlipidemia   . Diabetes mellitus     type 2  . Hypertension   . Osteopenia   . Allergic rhinitis   . Takotsubo cardiomyopathy     with subsequent normalization of left ventricular systolic function  . Hyperlipidemia   . Anxiety   . Acute MI   . Valvular heart disease   . Acid reflux   . Degenerative joint disease   . Neck pain, chronic   . Sciatica   . Gait disturbance   . SAH (subarachnoid hemorrhage)   . TIA (transient ischemic attack)    Past Surgical History  Procedure Laterality Date  . Abdominal hysterectomy  1977    no oophorectomy  . Spinal fusion  2000    Dr. Vertell Limber  . Cataract extraction, bilateral  2011   Family History  Problem  Relation Age of Onset  . Diabetes Sister   . Diabetes Sister   . Breast cancer      ?Aunt  . Cancer      breast?  . Intracerebral hemorrhage Daughter     assoc with post-partum  . Coronary artery disease Neg Hx    History  Substance Use Topics  . Smoking status: Never Smoker   . Smokeless tobacco: Never Used  . Alcohol Use: No   OB History   Grav Para Term Preterm Abortions TAB SAB Ect Mult Living                 Review of Systems  Constitutional: Negative.   Respiratory: Negative.   Cardiovascular: Negative for chest pain.    Allergies  Pioglitazone  Home Medications   Current Outpatient Rx  Name  Route  Sig  Dispense  Refill  . ALPRAZolam (XANAX) 0.5 MG tablet   Oral   Take 1 tablet (0.5 mg total) by mouth as directed. for anxiety   45 tablet   1   . aspirin 81 MG tablet   Oral   Take 81 mg by mouth daily.         . bisoprolol (ZEBETA) 5 MG tablet   Oral   Take 5 mg by mouth daily.          Marland Kitchen  citalopram (CELEXA) 10 MG tablet   Oral   Take 2 tablets (20 mg total) by mouth daily.   60 tablet   5   . fenofibrate (TRICOR) 48 MG tablet   Oral   Take 1 tablet (48 mg total) by mouth daily.   90 tablet   3   . glimepiride (AMARYL) 1 MG tablet      2 mg daily before breakfast. Take 3 tabs daily          . insulin aspart (NOVOLOG) 100 UNIT/ML injection   Subcutaneous   Inject 10 Units into the skin 2 (two) times daily with breakfast and lunch.         . lisinopril (PRINIVIL,ZESTRIL) 10 MG tablet   Oral   Take 10 mg by mouth daily.          Marland Kitchen lovastatin (MEVACOR) 40 MG tablet   Oral   Take 20 mg by mouth daily. Take 1 tab daily         . metFORMIN (GLUMETZA) 500 MG (MOD) 24 hr tablet   Oral   Take 1,000 mg by mouth daily.           BP 149/45  Pulse 63  Temp(Src) 97.3 F (36.3 C) (Oral)  Resp 16  Wt 141 lb 4.8 oz (64.093 kg)  SpO2 99% Physical Exam  Vitals reviewed. Constitutional: She is oriented to person, place, and  time. She appears well-developed and well-nourished.  HENT:  Head: Normocephalic and atraumatic.  Eyes: Conjunctivae and EOM are normal. Pupils are equal, round, and reactive to light.  Neck: Neck supple.  Cardiovascular: Normal rate and regular rhythm.   Pulmonary/Chest: Effort normal and breath sounds normal.  Abdominal: Soft. Bowel sounds are normal. There is no tenderness.  Musculoskeletal: Normal range of motion.  Neurological: She is alert and oriented to person, place, and time. Coordination normal.  Pt numbness in the left pinky finger and half the ring finger  Skin: Skin is warm and dry.  Psychiatric: She has a normal mood and affect.    ED Course  Procedures (including critical care time) Labs Review Labs Reviewed  CBC - Abnormal; Notable for the following:    RBC 3.85 (*)    All other components within normal limits  COMPREHENSIVE METABOLIC PANEL - Abnormal; Notable for the following:    Sodium 131 (*)    Glucose, Bld 458 (*)    BUN 43 (*)    Creatinine, Ser 1.31 (*)    GFR calc non Af Amer 38 (*)    GFR calc Af Amer 44 (*)    All other components within normal limits  URINALYSIS, ROUTINE W REFLEX MICROSCOPIC - Abnormal; Notable for the following:    Glucose, UA >1000 (*)    All other components within normal limits  GLUCOSE, CAPILLARY - Abnormal; Notable for the following:    Glucose-Capillary 411 (*)    All other components within normal limits  GLUCOSE, CAPILLARY - Abnormal; Notable for the following:    Glucose-Capillary 348 (*)    All other components within normal limits  URINE MICROSCOPIC-ADD ON - Abnormal; Notable for the following:    Squamous Epithelial / LPF FEW (*)    All other components within normal limits  GLUCOSE, CAPILLARY - Abnormal; Notable for the following:    Glucose-Capillary 241 (*)    All other components within normal limits   Imaging Review Ct Head Wo Contrast  01/08/2013   CLINICAL DATA:  Numbness in  the left fingers radiating  into the left arm.  EXAM: CT HEAD WITHOUT CONTRAST  TECHNIQUE: Contiguous axial images were obtained from the base of the skull through the vertex without intravenous contrast.  COMPARISON:  Head CT 11/27/2010.  FINDINGS: Patchy and confluent areas of decreased attenuation are noted throughout the deep and periventricular white matter of the cerebral hemispheres bilaterally, compatible with chronic microvascular ischemic disease. No acute intracranial abnormalities. Specifically, no evidence of acute intracranial hemorrhage, no definite findings of acute/subacute cerebral ischemia, no mass, mass effect, hydrocephalus or abnormal intra or extra-axial fluid collections. Visualized paranasal sinuses and mastoids are well pneumatized. No acute displaced skull fractures are identified.  IMPRESSION: 1. No acute intracranial abnormalities. 2. Chronic microvascular ischemic changes in the cerebral white matter redemonstrated, as above.   Electronically Signed   By: Vinnie Langton M.D.   On: 01/08/2013 14:32    EKG Interpretation   None       MDM   1. Hyperglycemia   2. Renal insufficiency   3. Ulnar nerve neuropathy, left      Exam consistent with ulnar nerve palsy:doubt tia or stroke:pt is okay to go home:pt has had fluids and insulin here   Glendell Docker, NP 01/08/13 1829

## 2013-01-26 LAB — LIPID PANEL: Cholesterol: 160 mg/dL (ref 0–200)

## 2013-02-02 ENCOUNTER — Other Ambulatory Visit: Payer: Self-pay | Admitting: Family Medicine

## 2013-02-02 DIAGNOSIS — E119 Type 2 diabetes mellitus without complications: Secondary | ICD-10-CM

## 2013-02-03 ENCOUNTER — Other Ambulatory Visit (INDEPENDENT_AMBULATORY_CARE_PROVIDER_SITE_OTHER): Payer: Medicare Other

## 2013-02-03 DIAGNOSIS — E119 Type 2 diabetes mellitus without complications: Secondary | ICD-10-CM

## 2013-02-03 LAB — BASIC METABOLIC PANEL
BUN: 32 mg/dL — AB (ref 6–23)
CALCIUM: 9.1 mg/dL (ref 8.4–10.5)
CO2: 22 meq/L (ref 19–32)
Chloride: 107 mEq/L (ref 96–112)
Creatinine, Ser: 1.3 mg/dL — ABNORMAL HIGH (ref 0.4–1.2)
GFR: 42.97 mL/min — AB (ref >60.00)
GLUCOSE: 213 mg/dL — AB (ref 70–99)
POTASSIUM: 4.4 meq/L (ref 3.5–5.1)
Sodium: 136 mEq/L (ref 135–145)

## 2013-02-20 ENCOUNTER — Emergency Department (INDEPENDENT_AMBULATORY_CARE_PROVIDER_SITE_OTHER): Payer: Medicare Other

## 2013-02-20 ENCOUNTER — Encounter (HOSPITAL_COMMUNITY): Payer: Self-pay | Admitting: Emergency Medicine

## 2013-02-20 ENCOUNTER — Emergency Department (HOSPITAL_COMMUNITY)
Admission: EM | Admit: 2013-02-20 | Discharge: 2013-02-20 | Disposition: A | Discharge status: Home / Self Care | Payer: Medicare Other | Source: Home / Self Care | Attending: Family Medicine | Admitting: Family Medicine

## 2013-02-20 DIAGNOSIS — K047 Periapical abscess without sinus: Secondary | ICD-10-CM

## 2013-02-20 MED ORDER — CLINDAMYCIN HCL 300 MG PO CAPS: 300.0000 mg | ORAL_CAPSULE | Freq: Three times a day (TID) | ORAL | Status: DC

## 2013-02-20 NOTE — ED Notes (Signed)
C/o left sided facial swelling and pain that radiates to ear and left side of neck.   Runny nose and post nasal drip.  Denies fever and any other symptoms.  Hx of dental infections.  No otc meds tried for symptoms.  On set 1/31

## 2013-02-20 NOTE — ED Provider Notes (Signed)
CSN: [id number]     Arrival date & time 02/20/13  1241 History   First MD Initiated Contact with Patient 02/20/13 1400     Chief Complaint  Patient presents with  . Facial Swelling   (Consider location/radiation/quality/duration/timing/severity/associated sxs/prior Treatment) Patient is a 80 y.o. female presenting with URI. The history is provided by the patient.  URI Presenting symptoms: congestion, facial pain and rhinorrhea   Presenting symptoms: no cough and no fever   Severity:  Moderate Onset quality:  Gradual Duration:  3 days Chronicity:  New Associated symptoms: sinus pain     Past Medical History  Diagnosis Date  . Depression   . GERD (gastroesophageal reflux disease)   . Hyperlipidemia   . Diabetes mellitus     type 2  . Hypertension   . Osteopenia   . Allergic rhinitis   . Takotsubo cardiomyopathy     with subsequent normalization of left ventricular systolic function  . Hyperlipidemia   . Anxiety   . Acute MI   . Valvular heart disease   . Acid reflux   . Degenerative joint disease   . Neck pain, chronic   . Sciatica   . Gait disturbance   . SAH (subarachnoid hemorrhage)   . TIA (transient ischemic attack)    Past Surgical History  Procedure Laterality Date  . Abdominal hysterectomy  1977    no oophorectomy  . Spinal fusion  2000    Dr. Vertell Limber  . Cataract extraction, bilateral  2011   Family History  Problem Relation Age of Onset  . Diabetes Sister   . Diabetes Sister   . Breast cancer      ?Aunt  . Cancer      breast?  . Intracerebral hemorrhage Daughter     assoc with post-partum  . Coronary artery disease Neg Hx    History  Substance Use Topics  . Smoking status: Never Smoker   . Smokeless tobacco: Never Used  . Alcohol Use: No   OB History   Grav Para Term Preterm Abortions TAB SAB Ect Mult Living                 Review of Systems  Constitutional: Negative.  Negative for fever.  HENT: Positive for congestion, dental problem,  postnasal drip, rhinorrhea and sinus pressure.   Eyes: Negative.   Respiratory: Negative for cough.   Cardiovascular: Negative.   Gastrointestinal: Negative.     Allergies  Pioglitazone  Home Medications   Current Outpatient Rx  Name  Route  Sig  Dispense  Refill  . ALPRAZolam (XANAX) 0.5 MG tablet   Oral   Take 1 tablet (0.5 mg total) by mouth as directed. for anxiety   45 tablet   1   . aspirin 81 MG tablet   Oral   Take 81 mg by mouth daily.         . bisoprolol (ZEBETA) 5 MG tablet   Oral   Take 5 mg by mouth daily.          . citalopram (CELEXA) 10 MG tablet   Oral   Take 2 tablets (20 mg total) by mouth daily.   60 tablet   5   . fenofibrate (TRICOR) 48 MG tablet   Oral   Take 1 tablet (48 mg total) by mouth daily.   90 tablet   3   . glimepiride (AMARYL) 1 MG tablet   Oral   Take 1 mg by mouth daily before  breakfast. Take 3 tabs daily         . linagliptin (TRADJENTA) 5 MG TABS tablet   Oral   Take 5 mg by mouth daily.         Marland Kitchen lisinopril (PRINIVIL,ZESTRIL) 10 MG tablet   Oral   Take 10 mg by mouth daily.          Marland Kitchen lovastatin (MEVACOR) 40 MG tablet   Oral   Take 40 mg by mouth daily.          . clindamycin (CLEOCIN) 300 MG capsule   Oral   Take 1 capsule (300 mg total) by mouth 3 (three) times daily.   21 capsule   0    BP 155/66  Pulse 60  Temp(Src) 98.2 F (36.8 C) (Oral)  Resp 18  SpO2 100% Physical Exam  Nursing note and vitals reviewed. Constitutional: She appears well-developed and well-nourished.  HENT:  Right Ear: External ear normal.  Left Ear: External ear normal.  Nose: Right sinus exhibits no maxillary sinus tenderness. Left sinus exhibits maxillary sinus tenderness.  Mouth/Throat: Oropharynx is clear and moist and mucous membranes are normal. Abnormal dentition.    Neck: Normal range of motion. Neck supple.  Lymphadenopathy:    She has no cervical adenopathy.    ED Course  Procedures (including  critical care time) Labs Review Labs Reviewed - No data to display Imaging Review Dg Sinuses Complete  02/20/2013   CLINICAL DATA:  Left facial pain  EXAM: PARANASAL SINUSES - COMPLETE 3 + VIEW  COMPARISON:  None.  FINDINGS: The paranasal sinuses are well aerated with the exception of the frontal sinus which is incompletely pneumatized. No air-fluid level or mucosal thickening is identified. No acute bony abnormality is seen.  IMPRESSION: Incomplete pneumatization of the frontal sinuses. No acute abnormality is noted.   Electronically Signed   By: Inez Catalina M.D.   On: 02/20/2013 14:43      MDM  X-rays reviewed and report per radiologist.     Billy Fischer, MD 02/20/13 [phone]

## 2013-02-20 NOTE — Discharge Instructions (Signed)
Take medicine as prescribed, see your dentist as soon as possible °

## 2013-05-19 ENCOUNTER — Encounter: Payer: Self-pay | Admitting: Family Medicine

## 2013-05-19 ENCOUNTER — Ambulatory Visit (INDEPENDENT_AMBULATORY_CARE_PROVIDER_SITE_OTHER): Payer: Medicare Other | Admitting: Family Medicine

## 2013-05-19 VITALS — BP 140/80 | HR 60 | Temp 98.4°F | Resp 16 | Wt 143.1 lb

## 2013-05-19 DIAGNOSIS — R350 Frequency of micturition: Secondary | ICD-10-CM

## 2013-05-19 DIAGNOSIS — E119 Type 2 diabetes mellitus without complications: Secondary | ICD-10-CM

## 2013-05-19 DIAGNOSIS — N390 Urinary tract infection, site not specified: Secondary | ICD-10-CM

## 2013-05-19 DIAGNOSIS — R319 Hematuria, unspecified: Secondary | ICD-10-CM

## 2013-05-19 DIAGNOSIS — R81 Glycosuria: Secondary | ICD-10-CM

## 2013-05-19 DIAGNOSIS — R82998 Other abnormal findings in urine: Secondary | ICD-10-CM

## 2013-05-19 LAB — GLUCOSE, POCT (MANUAL RESULT ENTRY): POC Glucose: 202 mg/dl — AB (ref 70–99)

## 2013-05-19 LAB — POCT URINALYSIS DIPSTICK
BILIRUBIN UA: NEGATIVE
Ketones, UA: NEGATIVE
NITRITE UA: NEGATIVE
PROTEIN UA: NEGATIVE
Spec Grav, UA: 1.01
Urobilinogen, UA: 0.2
pH, UA: 7.5

## 2013-05-19 MED ORDER — CEPHALEXIN 500 MG PO CAPS: 500.0000 mg | ORAL_CAPSULE | Freq: Two times a day (BID) | ORAL | Status: AC

## 2013-05-19 NOTE — Assessment & Plan Note (Signed)
Pt's sxs and UA consistent w/ infxn.  Start abx.  Reviewed supportive care and red flags that should prompt return.  Pt expressed understanding and is in agreement w/ plan.  

## 2013-05-19 NOTE — Addendum Note (Signed)
Addended by: Modena Morrow D on: 05/19/2013 04:28 PM   Modules accepted: Orders

## 2013-05-19 NOTE — Progress Notes (Signed)
   Subjective:    Patient ID: Sandra [name], female    DOB: Jul 07, 1933, 80 y.o.   MRN: [id number]  HPI UTI- sxs started 5-6 days ago.  + suprapubic pressure, dysuria.  Increased frequency.  DM- chronic problem, in Sept pt reports CBG was 'over 600'.  Went to Dr Chalmers Cater and was started on insulin.  Pt developed severe myalgias after starting insulin and was unable to get out of bed.  Was told to stop insulin and come back in 2 weeks.  Went to ER w/ CBG > 300.  'the only thing I can do now is send you to [facility]'.  Pt now seeing Jerlyn Ly, PA at [facility]  Had appt 4/20- A1C 8. >2000 glucose in urine today but CBG in office 202   Review of Systems For ROS see HPI     Objective:   Physical Exam  Vitals reviewed. Constitutional: She appears well-developed and well-nourished. No distress.  Abdominal: Soft. She exhibits no distension. There is no tenderness (no suprapubic or CVA tenderness).          Assessment & Plan:

## 2013-05-19 NOTE — Patient Instructions (Signed)
Please schedule your complete physical in 3 months Start the Keflex twice daily for UTI Drink plenty of fluids Please have your diabetes doctors send me their notes Call with any questions or concerns Hang in there!!!

## 2013-05-22 LAB — URINE CULTURE

## 2013-06-09 ENCOUNTER — Telehealth: Payer: Self-pay | Admitting: General Practice

## 2013-06-09 DIAGNOSIS — E785 Hyperlipidemia, unspecified: Secondary | ICD-10-CM

## 2013-06-09 NOTE — Telephone Encounter (Signed)
Per [facility], pt does not want to drive to [facility] to have CK and Hepatic labs. These orders were placed per Tabori.

## 2013-06-14 NOTE — Telephone Encounter (Signed)
Spoke with pt she has an appt for labs tomorrow.

## 2013-06-15 ENCOUNTER — Other Ambulatory Visit (INDEPENDENT_AMBULATORY_CARE_PROVIDER_SITE_OTHER): Payer: Medicare Other

## 2013-06-15 DIAGNOSIS — E785 Hyperlipidemia, unspecified: Secondary | ICD-10-CM

## 2013-06-15 NOTE — Addendum Note (Signed)
Addended by: Harl Bowie on: 06/15/2013 01:10 PM   Modules accepted: Orders

## 2013-06-16 LAB — HEPATIC FUNCTION PANEL
ALBUMIN: 4.1 g/dL (ref 3.5–5.2)
ALK PHOS: 49 U/L (ref 39–117)
ALT: 30 U/L (ref 0–35)
AST: 28 U/L (ref 0–37)
BILIRUBIN DIRECT: 0 mg/dL (ref 0.0–0.3)
Total Bilirubin: 0.5 mg/dL (ref 0.2–1.2)
Total Protein: 7 g/dL (ref 6.0–8.3)

## 2013-06-16 LAB — CK: Total CK: 64 U/L (ref 7–177)

## 2013-06-22 LAB — HM DIABETES EYE EXAM

## 2013-06-29 ENCOUNTER — Encounter: Payer: Self-pay | Admitting: General Practice

## 2013-08-04 ENCOUNTER — Telehealth: Payer: Self-pay | Admitting: General Practice

## 2013-08-04 DIAGNOSIS — E1169 Type 2 diabetes mellitus with other specified complication: Secondary | ICD-10-CM

## 2013-08-04 DIAGNOSIS — N183 Chronic kidney disease, stage 3 unspecified: Secondary | ICD-10-CM

## 2013-08-04 DIAGNOSIS — E785 Hyperlipidemia, unspecified: Secondary | ICD-10-CM

## 2013-08-04 DIAGNOSIS — E559 Vitamin D deficiency, unspecified: Secondary | ICD-10-CM

## 2013-08-04 DIAGNOSIS — I10 Essential (primary) hypertension: Secondary | ICD-10-CM

## 2013-08-04 DIAGNOSIS — E119 Type 2 diabetes mellitus without complications: Secondary | ICD-10-CM

## 2013-08-04 NOTE — Telephone Encounter (Signed)
Pt dropped off labs for [facility]. Results need to be faxed to Melton Alar, PA-C at [phone]. Labs were ordered for pt.    Orders placed.

## 2013-08-09 ENCOUNTER — Other Ambulatory Visit (INDEPENDENT_AMBULATORY_CARE_PROVIDER_SITE_OTHER): Payer: Medicare Other

## 2013-08-09 DIAGNOSIS — E119 Type 2 diabetes mellitus without complications: Secondary | ICD-10-CM

## 2013-08-09 DIAGNOSIS — E785 Hyperlipidemia, unspecified: Secondary | ICD-10-CM

## 2013-08-09 DIAGNOSIS — N183 Chronic kidney disease, stage 3 unspecified: Secondary | ICD-10-CM

## 2013-08-09 DIAGNOSIS — E559 Vitamin D deficiency, unspecified: Secondary | ICD-10-CM

## 2013-08-09 DIAGNOSIS — I10 Essential (primary) hypertension: Secondary | ICD-10-CM

## 2013-08-09 DIAGNOSIS — E1169 Type 2 diabetes mellitus with other specified complication: Secondary | ICD-10-CM

## 2013-08-09 LAB — COMPREHENSIVE METABOLIC PANEL
ALT: 19 U/L (ref 0–35)
AST: 21 U/L (ref 0–37)
Albumin: 4.1 g/dL (ref 3.5–5.2)
Alkaline Phosphatase: 52 U/L (ref 39–117)
BILIRUBIN TOTAL: 0.8 mg/dL (ref 0.2–1.2)
BUN: 38 mg/dL — ABNORMAL HIGH (ref 6–23)
CO2: 24 mEq/L (ref 19–32)
Calcium: 9.6 mg/dL (ref 8.4–10.5)
Chloride: 105 mEq/L (ref 96–112)
Creatinine, Ser: 1.4 mg/dL — ABNORMAL HIGH (ref 0.4–1.2)
GFR: 37.76 mL/min — ABNORMAL LOW (ref >60.00)
GLUCOSE: 242 mg/dL — AB (ref 70–99)
Potassium: 5.1 mEq/L (ref 3.5–5.1)
Sodium: 136 mEq/L (ref 135–145)
TOTAL PROTEIN: 7.3 g/dL (ref 6.0–8.3)

## 2013-08-09 LAB — LIPID PANEL
CHOL/HDL RATIO: 7
CHOLESTEROL: 265 mg/dL — AB (ref 0–200)
HDL: 39.1 mg/dL (ref >39.00)
NonHDL: 225.9
Triglycerides: 263 mg/dL — ABNORMAL HIGH (ref 0.0–149.0)
VLDL: 52.6 mg/dL — AB (ref 0.0–40.0)

## 2013-08-09 LAB — LDL CHOLESTEROL, DIRECT: LDL DIRECT: 195 mg/dL

## 2013-08-09 LAB — TSH: TSH: 1.55 u[IU]/mL (ref 0.35–4.50)

## 2013-08-09 LAB — VITAMIN D 25 HYDROXY (VIT D DEFICIENCY, FRACTURES): VITD: 38.07 ng/mL (ref 30.00–100.00)

## 2013-08-23 ENCOUNTER — Encounter: Payer: Self-pay | Admitting: Family Medicine

## 2013-08-23 ENCOUNTER — Ambulatory Visit (INDEPENDENT_AMBULATORY_CARE_PROVIDER_SITE_OTHER): Payer: Medicare Other | Admitting: Family Medicine

## 2013-08-23 VITALS — BP 136/82 | HR 72 | Temp 98.0°F | Resp 17 | Ht 60.5 in | Wt 145.1 lb

## 2013-08-23 DIAGNOSIS — M545 Low back pain, unspecified: Secondary | ICD-10-CM

## 2013-08-23 DIAGNOSIS — E785 Hyperlipidemia, unspecified: Secondary | ICD-10-CM

## 2013-08-23 DIAGNOSIS — Z0000 Encounter for general adult medical examination without abnormal findings: Secondary | ICD-10-CM

## 2013-08-23 DIAGNOSIS — M810 Age-related osteoporosis without current pathological fracture: Secondary | ICD-10-CM

## 2013-08-23 DIAGNOSIS — Z1231 Encounter for screening mammogram for malignant neoplasm of breast: Secondary | ICD-10-CM

## 2013-08-23 DIAGNOSIS — I10 Essential (primary) hypertension: Secondary | ICD-10-CM

## 2013-08-23 DIAGNOSIS — F329 Major depressive disorder, single episode, unspecified: Secondary | ICD-10-CM

## 2013-08-23 DIAGNOSIS — F3289 Other specified depressive episodes: Secondary | ICD-10-CM

## 2013-08-23 DIAGNOSIS — E119 Type 2 diabetes mellitus without complications: Secondary | ICD-10-CM

## 2013-08-23 MED ORDER — HYDROCODONE-ACETAMINOPHEN 5-325 MG PO TABS: 1.0000 | ORAL_TABLET | Freq: Four times a day (QID) | ORAL | Status: DC | PRN

## 2013-08-23 MED ORDER — ATORVASTATIN CALCIUM 20 MG PO TABS: 20.0000 mg | ORAL_TABLET | Freq: Every day | ORAL | Status: DC

## 2013-08-23 MED ORDER — CITALOPRAM HYDROBROMIDE 10 MG PO TABS: 10.0000 mg | ORAL_TABLET | Freq: Every day | ORAL | Status: DC

## 2013-08-23 MED ORDER — BISOPROLOL FUMARATE 5 MG PO TABS: 5.0000 mg | ORAL_TABLET | Freq: Every day | ORAL | Status: DC

## 2013-08-23 MED ORDER — TIZANIDINE HCL 2 MG PO TABS: 2.0000 mg | ORAL_TABLET | Freq: Three times a day (TID) | ORAL | Status: DC

## 2013-08-23 NOTE — Progress Notes (Signed)
Pre visit review using our clinic review tool, if applicable. No additional management support is needed unless otherwise documented below in the visit note. 

## 2013-08-23 NOTE — Progress Notes (Signed)
Subjective:    Patient ID: Sandra [name], female    DOB: 02-19-1933, 80 y.o.   MRN: [id number]  HPI Here today for CPE.  Risk Factors: Back pain- has been seeing chiropracter regularly for 1-2 weeks.  Has been told' my spine in curved'.  Son gave her muscle relaxer w/ good relief.  'it's really bad'.  Asking for muscle relaxer or pain medication but fears 'getting hooked'.  Has seen neurosurg- 'it wasn't a disc problem'.  Pt not willing to f/u w/ surgery. DM- following w/ Dr London Sheer at [facility].  In on Onglyza, Toujeo.  On ACE for renal protection.  UTD on eye exam. Hyperlipidemia- pt was on Lovastatin but this was switched to Atorvastatin.  Pt reports they tried to double dose from 20 --> 40mg  but she was unable to tolerate this.  Stopped med entirely.  On Fenofibrate. HTN- chronic problem, on Bisoprolol, Lisinopril.  Beta blocker was started at time of MI previous. Physical Activity: limited due to back pain Fall Risk: low Depression: chronic problem, worsened at time of stroke.  Pt was started on Celexa but stopped meds when script ran out. Hearing: normal to conversational tones and whispered voice at 6 ft ADL's: independent Cognitive: normal linear thought process, memory and attention intact Home Safety: safe at home, lives w/ daughter and granddaughter Height, Weight, BMI, Visual Acuity: see vitals, vision corrected to 20/20 w/ glasses Counseling: UTD on colonoscopy, due for mammo and DEXA ([facility]) Labs Ordered: See A&P Care Plan: See A&P    Review of Systems Patient reports no vision/ hearing changes, adenopathy,fever, weight change,  persistant/recurrent hoarseness , swallowing issues, chest pain, palpitations, edema, persistant/recurrent cough, hemoptysis, dyspnea (rest/exertional/paroxysmal nocturnal), gastrointestinal bleeding (melena, rectal bleeding), abdominal pain, significant heartburn, bowel changes, GU symptoms (dysuria, hematuria, incontinence), Gyn symptoms (abnormal   bleeding, pain),  syncope, focal weakness, memory loss, numbness & tingling, skin/hair/nail changes, abnormal bruising or bleeding, anxiety, or depression.    Objective:   Physical Exam General Appearance:    Alert, cooperative, no distress, appears stated age  Head:    Normocephalic, without obvious abnormality, atraumatic  Eyes:    PERRL, conjunctiva/corneas clear, EOM's intact, fundi    benign, both eyes  Ears:    Normal TM's and external ear canals, both ears  Nose:   Nares normal, septum midline, mucosa normal, no drainage    or sinus tenderness  Throat:   Lips, mucosa, and tongue normal; teeth and gums normal  Neck:   Supple, symmetrical, trachea midline, no adenopathy;    Thyroid: no enlargement/tenderness/nodules  Back:     Symmetric, no curvature, ROM normal, no CVA tenderness  Lungs:     Clear to auscultation bilaterally, respirations unlabored  Chest Wall:    No tenderness or deformity   Heart:    Regular rate and rhythm, S1 and S2 normal, no murmur, rub   or gallop  Breast Exam:    Deferred to mammo  Abdomen:     Soft, non-tender, bowel sounds active all four quadrants,    no masses, no organomegaly  Genitalia:    Deferred at pt's request  Rectal:    Extremities:   Extremities normal, atraumatic, no cyanosis or edema  Pulses:   2+ and symmetric all extremities  Skin:   Skin color, texture, turgor normal, no rashes or lesions  Lymph nodes:   Cervical, supraclavicular, and axillary nodes normal  Neurologic:   CNII-XII intact, normal strength, sensation and reflexes    throughout  Assessment & Plan:

## 2013-08-23 NOTE — Patient Instructions (Signed)
Follow up in 6-8 weeks to recheck mood Restart the Citalopram daily for depression Use the Tizanadine as needed for spasm- will cause drowsiness Use the Hydrocodone as needed for pain- use sparingly due to possible fatigue/drowsiness Restart the Atorvastatin for the cholesterol Restart the Bisoprolol We'll notify you of your mammo and bone density appts at [facility] Please call with any questions or concerns Hang in there!!!

## 2013-08-24 ENCOUNTER — Telehealth: Payer: Self-pay | Admitting: Family Medicine

## 2013-08-24 NOTE — Telephone Encounter (Signed)
Relevant patient education mailed to patient.  

## 2013-08-25 NOTE — Assessment & Plan Note (Signed)
Chronic problem.  Adequate control but needs to restart beta blocker due to hx of previous MI.  Pt expressed understanding and is in agreement w/ plan.

## 2013-08-25 NOTE — Assessment & Plan Note (Signed)
Chronic problem.  Pt to restart Lipitor daily based on recent labs.  Pt expressed understanding and is in agreement w/ plan.

## 2013-08-25 NOTE — Assessment & Plan Note (Signed)
Chronic problem, due for DEXA.  Check Vit D

## 2013-08-25 NOTE — Assessment & Plan Note (Signed)
Pt's PE WNL.  Due for mammo and DEXA- orders entered.  UTD on colonoscopy.  No need for paps.  Reviewed recent labs.  Written screening schedule updated and given to pt.  Anticipatory guidance provided.

## 2013-08-25 NOTE — Assessment & Plan Note (Signed)
Chronic problem.  Deteriorated.  Start Zanaflex prn.  Hydrocodone as needed.  Pt refusing to see ortho or neurosurg.  Will follow.

## 2013-08-25 NOTE — Assessment & Plan Note (Signed)
Deteriorated due to back pain.  Encouraged pt to restart Celexa daily.  Will follow.

## 2013-08-25 NOTE — Assessment & Plan Note (Signed)
Chronic problem.  Following w/ Endo at [facility].  meds were all recently adjusted due to pt entering the donut hole.  Reviewed recent labs.  Will follow.

## 2013-09-08 LAB — HM MAMMOGRAPHY

## 2013-09-08 LAB — HM DEXA SCAN

## 2013-09-11 ENCOUNTER — Encounter: Payer: Self-pay | Admitting: General Practice

## 2013-09-22 ENCOUNTER — Encounter: Payer: Self-pay | Admitting: General Practice

## 2013-09-22 ENCOUNTER — Encounter: Payer: Self-pay | Admitting: Family Medicine

## 2013-09-22 ENCOUNTER — Ambulatory Visit (INDEPENDENT_AMBULATORY_CARE_PROVIDER_SITE_OTHER): Payer: Medicare Other | Admitting: Family Medicine

## 2013-09-22 ENCOUNTER — Telehealth: Payer: Self-pay | Admitting: General Practice

## 2013-09-22 VITALS — BP 140/92 | HR 69 | Wt 144.0 lb

## 2013-09-22 DIAGNOSIS — M5430 Sciatica, unspecified side: Secondary | ICD-10-CM

## 2013-09-22 DIAGNOSIS — R112 Nausea with vomiting, unspecified: Secondary | ICD-10-CM

## 2013-09-22 DIAGNOSIS — G8929 Other chronic pain: Secondary | ICD-10-CM | POA: Insufficient documentation

## 2013-09-22 DIAGNOSIS — M5431 Sciatica, right side: Secondary | ICD-10-CM

## 2013-09-22 DIAGNOSIS — M533 Sacrococcygeal disorders, not elsewhere classified: Secondary | ICD-10-CM

## 2013-09-22 HISTORY — DX: Other chronic pain: G89.29

## 2013-09-22 HISTORY — DX: Nausea with vomiting, unspecified: R11.2

## 2013-09-22 LAB — HEPATIC FUNCTION PANEL
ALK PHOS: 53 U/L (ref 39–117)
ALT: 17 U/L (ref 0–35)
AST: 21 U/L (ref 0–37)
Albumin: 4.1 g/dL (ref 3.5–5.2)
BILIRUBIN TOTAL: 0.5 mg/dL (ref 0.2–1.2)
Bilirubin, Direct: 0 mg/dL (ref 0.0–0.3)
Total Protein: 7.6 g/dL (ref 6.0–8.3)

## 2013-09-22 LAB — CBC WITH DIFFERENTIAL/PLATELET
BASOS ABS: 0 10*3/uL (ref 0.0–0.1)
Basophils Relative: 0.8 % (ref 0.0–3.0)
Eosinophils Absolute: 0.1 10*3/uL (ref 0.0–0.7)
Eosinophils Relative: 2.5 % (ref 0.0–5.0)
HEMATOCRIT: 38.7 % (ref 36.0–46.0)
Hemoglobin: 13.4 g/dL (ref 12.0–15.0)
Lymphocytes Relative: 33.8 % (ref 12.0–46.0)
Lymphs Abs: 1.9 10*3/uL (ref 0.7–4.0)
MCHC: 34.6 g/dL (ref 30.0–36.0)
MCV: 94.3 fl (ref 78.0–100.0)
MONO ABS: 0.5 10*3/uL (ref 0.1–1.0)
Monocytes Relative: 9.4 % (ref 3.0–12.0)
NEUTROS PCT: 53.5 % (ref 43.0–77.0)
Neutro Abs: 3 10*3/uL (ref 1.4–7.7)
PLATELETS: 292 10*3/uL (ref 150.0–400.0)
RBC: 4.11 Mil/uL (ref 3.87–5.11)
RDW: 12.4 % (ref 11.5–15.5)
WBC: 5.6 10*3/uL (ref 4.0–10.5)

## 2013-09-22 LAB — BASIC METABOLIC PANEL
BUN: 21 mg/dL (ref 6–23)
CHLORIDE: 104 meq/L (ref 96–112)
CO2: 27 mEq/L (ref 19–32)
CREATININE: 1.5 mg/dL — AB (ref 0.4–1.2)
Calcium: 9.6 mg/dL (ref 8.4–10.5)
GFR: 36.28 mL/min — ABNORMAL LOW (ref >60.00)
GLUCOSE: 215 mg/dL — AB (ref 70–99)
Potassium: 4.9 mEq/L (ref 3.5–5.1)
Sodium: 137 mEq/L (ref 135–145)

## 2013-09-22 LAB — H. PYLORI ANTIBODY, IGG: H PYLORI IGG: NEGATIVE

## 2013-09-22 MED ORDER — OMEPRAZOLE 40 MG PO CPDR: 40.0000 mg | DELAYED_RELEASE_CAPSULE | Freq: Every day | ORAL | Status: DC

## 2013-09-22 MED ORDER — ONDANSETRON 4 MG PO TBDP
4.0000 mg | ORAL_TABLET | Freq: Three times a day (TID) | ORAL | Status: DC | PRN
Start: 2013-09-22 — End: 2015-05-31

## 2013-09-22 NOTE — Assessment & Plan Note (Signed)
New.  Occuring in the AM for weeks.  Vomited x2- including in office this AM.  Nausea improves throughout the day.  No relation to eating- may actually improve w/ food.  Suspect pt has increased acid production and this is causing her nausea and vomiting.  Start PPI.  Zofran prn.  Check labs to r/o other causes.  If no improvement will need GI referral.  Reviewed supportive care and red flags that should prompt return.  Pt expressed understanding and is in agreement w/ plan.

## 2013-09-22 NOTE — Assessment & Plan Note (Signed)
New to provider, ongoing for pt.  Reports she's had 'so many MRIs' and 'no one can find anything'.  Pt has pain meds and muscle relaxers available.  Based on worsening pain and mild bilateral LE hyperreflexia, will refer to neurosurg.  Reviewed supportive care and red flags that should prompt return.  Pt expressed understanding and is in agreement w/ plan.

## 2013-09-22 NOTE — Progress Notes (Signed)
   Subjective:    Patient ID: Sandra [name], female    DOB: 02/01/1933, 80 y.o.   MRN: [id number]  HPI Back pain- 'it hurts all the time'.  Pt has had MRI- 'they can't find anything'.  'i don't think it's the bones.  Pain in R buttock radiating into leg to level of the knee.  Some improvement w/ muscle relaxer and took pain meds yesterday for 1st time w/ good relief.  'i stay in the bed a lot of the time'.  Vomiting- pt is finding herself very nauseous in the AM.  Has vomited x2 including today in office.  sxs have been for 'the last couple of weeks'.  Nausea improves as day goes on.  No change w/ eating.  No fevers.  Not currently on PPI.  Pt reports vomiting stomach.   Review of Systems For ROS see HPI     Objective:   Physical Exam  Vitals reviewed. Constitutional: She is oriented to person, place, and time. She appears well-developed and well-nourished.  Obviously uncomfortable  HENT:  Head: Normocephalic and atraumatic.  Eyes: Conjunctivae and EOM are normal. Pupils are equal, round, and reactive to light.  Cardiovascular: Normal rate, regular rhythm, normal heart sounds and intact distal pulses.   Pulmonary/Chest: Effort normal and breath sounds normal. No respiratory distress. She has no wheezes. She has no rales.  Abdominal: Soft. Bowel sounds are normal. She exhibits no distension. There is no tenderness. There is no rebound and no guarding.  Musculoskeletal:  + TTP over R buttock, localized to SI joint  Neurological: She is alert and oriented to person, place, and time. Coordination normal.  Mildly hyperreflexic in LEs bilaterally          Assessment & Plan:

## 2013-09-22 NOTE — Progress Notes (Signed)
Pre visit review using our clinic review tool, if applicable. No additional management support is needed unless otherwise documented below in the visit note. 

## 2013-09-22 NOTE — Assessment & Plan Note (Signed)
Chronic problem.  Pt has pain meds and muscle relaxers available.  Will refer to neurosurg due to persistent sxs.

## 2013-09-22 NOTE — Patient Instructions (Signed)
Follow up in 1 month to recheck AM nausea Start the omeprazole daily to decrease the acid production We'll notify you of your lab results and make any changes if needed Drink plenty of fluids- sip small amounts but frequently REST! We'll call you with your neurosurgery appt Continue your pain meds as needed- this can cause nausea Use the Zofran as needed for nausea Call with any questions or concerns HANG IN THERE!!

## 2013-09-22 NOTE — Telephone Encounter (Signed)
PA started for ondansetron 4mg . Papers faxed to [facility] enhanced [place] [phone].

## 2013-10-04 ENCOUNTER — Ambulatory Visit: Payer: Medicare Other | Admitting: Family Medicine

## 2013-10-24 ENCOUNTER — Encounter: Payer: Self-pay | Admitting: Family Medicine

## 2013-10-24 ENCOUNTER — Ambulatory Visit (INDEPENDENT_AMBULATORY_CARE_PROVIDER_SITE_OTHER): Payer: Medicare Other | Admitting: Family Medicine

## 2013-10-24 VITALS — BP 134/80 | HR 64 | Temp 98.0°F | Resp 16 | Wt 145.0 lb

## 2013-10-24 DIAGNOSIS — R112 Nausea with vomiting, unspecified: Secondary | ICD-10-CM

## 2013-10-24 DIAGNOSIS — Z23 Encounter for immunization: Secondary | ICD-10-CM

## 2013-10-24 DIAGNOSIS — M5431 Sciatica, right side: Secondary | ICD-10-CM

## 2013-10-24 DIAGNOSIS — K219 Gastro-esophageal reflux disease without esophagitis: Secondary | ICD-10-CM

## 2013-10-24 NOTE — Progress Notes (Signed)
   Subjective:    Patient ID: Sandra [name], female    DOB: 13-Aug-1933, 80 y.o.   MRN: [id number]  HPI Nausea- pt reports sxs have resolved w/ addition of daily Omeprazole.  No vomiting since last OV.  No fevers.  GERD has improved.  Pt reports pain is improving after starting new chiropractic technique.   Review of Systems For ROS see HPI     Objective:   Physical Exam  Vitals reviewed. Constitutional: She is oriented to person, place, and time. She appears well-developed and well-nourished. No distress.  HENT:  Head: Normocephalic and atraumatic.  Cardiovascular: Normal rate, regular rhythm, normal heart sounds and intact distal pulses.   Pulmonary/Chest: Effort normal and breath sounds normal. No respiratory distress. She has no wheezes. She has no rales.  Abdominal: Soft. Bowel sounds are normal. She exhibits no distension. There is no tenderness. There is no rebound and no guarding.  Musculoskeletal: She exhibits no edema.  Neurological: She is alert and oriented to person, place, and time.  Skin: Skin is warm and dry.  Psychiatric: She has a normal mood and affect. Her behavior is normal. Thought content normal.          Assessment & Plan:

## 2013-10-24 NOTE — Assessment & Plan Note (Signed)
Resolved since starting PPI daily.

## 2013-10-24 NOTE — Assessment & Plan Note (Signed)
Pt's AM nausea and vomiting has resolved w/ addition of PPI.  Pt reports she is no longer having heartburn either.  Will continue to follow.

## 2013-10-24 NOTE — Patient Instructions (Signed)
Follow up in Feb for BP and cholesterol Continue the Omeprazole daily for the reflux I'm SO glad that you're feeling better I'll check on the status of that referral Call with any questions or concerns Happy Fall!!

## 2013-10-24 NOTE — Progress Notes (Signed)
Pre visit review using our clinic review tool, if applicable. No additional management support is needed unless otherwise documented below in the visit note. 

## 2013-10-24 NOTE — Assessment & Plan Note (Signed)
Ongoing.  Pain has improved somewhat w/ new chiropractic technique.  Pt has not heard from Neurosurg about referral that was placed on 9/4.  Spoke w/ referral coordinators who are going to call neurosurg and get pt appt.

## 2013-11-02 LAB — HEMOGLOBIN A1C: Hgb A1c MFr Bld: 10.5 % — AB (ref 4.0–6.0)

## 2013-11-03 ENCOUNTER — Telehealth: Payer: Self-pay | Admitting: *Deleted

## 2013-11-03 NOTE — Telephone Encounter (Signed)
Received medical record request signed by patient from [facility] requesting most recent labs. Labs faxed to [phone]. JG//CMA

## 2013-11-07 ENCOUNTER — Encounter: Payer: Self-pay | Admitting: General Practice

## 2014-02-20 LAB — HEMOGLOBIN A1C: Hgb A1c MFr Bld: 10 % — AB (ref 4.0–6.0)

## 2014-02-26 ENCOUNTER — Ambulatory Visit (INDEPENDENT_AMBULATORY_CARE_PROVIDER_SITE_OTHER): Payer: Medicare Other | Admitting: Family Medicine

## 2014-02-26 ENCOUNTER — Encounter: Payer: Self-pay | Admitting: General Practice

## 2014-02-26 ENCOUNTER — Encounter: Payer: Self-pay | Admitting: Family Medicine

## 2014-02-26 VITALS — BP 138/88 | HR 72 | Temp 98.0°F | Resp 16 | Wt 146.5 lb

## 2014-02-26 DIAGNOSIS — E785 Hyperlipidemia, unspecified: Secondary | ICD-10-CM

## 2014-02-26 DIAGNOSIS — I10 Essential (primary) hypertension: Secondary | ICD-10-CM

## 2014-02-26 DIAGNOSIS — E1169 Type 2 diabetes mellitus with other specified complication: Secondary | ICD-10-CM

## 2014-02-26 DIAGNOSIS — F411 Generalized anxiety disorder: Secondary | ICD-10-CM

## 2014-02-26 LAB — HEPATIC FUNCTION PANEL
ALK PHOS: 69 U/L (ref 39–117)
ALT: 21 U/L (ref 0–35)
AST: 20 U/L (ref 0–37)
Albumin: 4.2 g/dL (ref 3.5–5.2)
BILIRUBIN TOTAL: 0.4 mg/dL (ref 0.2–1.2)
Bilirubin, Direct: 0.1 mg/dL (ref 0.0–0.3)
TOTAL PROTEIN: 7.3 g/dL (ref 6.0–8.3)

## 2014-02-26 LAB — BASIC METABOLIC PANEL
BUN: 33 mg/dL — ABNORMAL HIGH (ref 6–23)
CHLORIDE: 102 meq/L (ref 96–112)
CO2: 24 mEq/L (ref 19–32)
CREATININE: 1.57 mg/dL — AB (ref 0.40–1.20)
Calcium: 9.5 mg/dL (ref 8.4–10.5)
GFR: 33.85 mL/min — ABNORMAL LOW (ref >60.00)
GLUCOSE: 272 mg/dL — AB (ref 70–99)
Potassium: 4.8 mEq/L (ref 3.5–5.1)
Sodium: 134 mEq/L — ABNORMAL LOW (ref 135–145)

## 2014-02-26 LAB — LDL CHOLESTEROL, DIRECT: LDL DIRECT: 110 mg/dL

## 2014-02-26 LAB — LIPID PANEL
CHOL/HDL RATIO: 5
CHOLESTEROL: 225 mg/dL — AB (ref 0–200)
HDL: 43.2 mg/dL (ref >39.00)
NONHDL: 181.8
Triglycerides: 291 mg/dL — ABNORMAL HIGH (ref 0.0–149.0)
VLDL: 58.2 mg/dL — ABNORMAL HIGH (ref 0.0–40.0)

## 2014-02-26 MED ORDER — CLONAZEPAM 0.5 MG PO TABS: 0.5000 mg | ORAL_TABLET | Freq: Two times a day (BID) | ORAL | Status: DC | PRN

## 2014-02-26 MED ORDER — METOPROLOL SUCCINATE ER 50 MG PO TB24: 50.0000 mg | ORAL_TABLET | Freq: Every day | ORAL | Status: DC

## 2014-02-26 NOTE — Progress Notes (Signed)
Pre visit review using our clinic review tool, if applicable. No additional management support is needed unless otherwise documented below in the visit note. 

## 2014-02-26 NOTE — Progress Notes (Signed)
   Subjective:    Patient ID: Sandra [name], female    DOB: 1933-05-15, 81 y.o.   MRN: [id number]  HPI HTN- chronic problem, on Lisinopril, Bisoprolol.  Bisoprolol is no longer covered by pt's formulary.  No CP, SOB, HAs, visual changes, edema.  Hyperlipidemia- chronic problem, on Atorvastatin and Fenofibrate.  Pt is trying to convince Endo to allow her off statin x3 months.  When she stopped the statin in July her LDL went up to 195.  Denies abd pain, N/V, myalgias.  Anxiety- chronic problem, pt reports that Alprazolam is no longer covered by formulary.   Review of Systems For ROS see HPI   Reviewed meds, allergies, problem list, and PMH in chart     Objective:   Physical Exam  Constitutional: She is oriented to person, place, and time. She appears well-developed and well-nourished. No distress.  HENT:  Head: Normocephalic and atraumatic.  Eyes: Conjunctivae and EOM are normal. Pupils are equal, round, and reactive to light.  Neck: Normal range of motion. Neck supple. No thyromegaly present.  Cardiovascular: Normal rate, regular rhythm, normal heart sounds and intact distal pulses.   No murmur heard. Pulmonary/Chest: Effort normal and breath sounds normal. No respiratory distress.  Abdominal: Soft. She exhibits no distension. There is no tenderness.  Musculoskeletal: She exhibits no edema.  Lymphadenopathy:    She has no cervical adenopathy.  Neurological: She is alert and oriented to person, place, and time.  Skin: Skin is warm and dry.  Psychiatric: She has a normal mood and affect. Her behavior is normal.  Vitals reviewed.         Assessment & Plan:

## 2014-02-26 NOTE — Patient Instructions (Signed)
Follow up in 4-6 weeks to recheck BP We'll notify you of your lab results and make any changes if needed Finish the Bisoprolol that you have and when you are out, switch to the Metoprolol daily Use the Alprazolam as needed and when you run out, switch to the Clonazepam (start w/ 1/2 tab and increase to 1 tab as needed) Please continue the Lipitor daily for the cholesterol Call with any questions or concerns Hang in there!!!

## 2014-02-26 NOTE — Assessment & Plan Note (Signed)
Chronic problem.  Pt is only taking Alprazolam as needed but this is no longer covered by her insurance.  Will switch to low dose Clonazepam prn.  Script provided to pt.

## 2014-02-26 NOTE — Assessment & Plan Note (Signed)
Chronic problem.  Adequate but not ideal control.  Insurance no longer covers her bisoprolol.  Based on this, will switch to Metoprolol XR 50mg  daily and monitor for BP control.  Pt expressed understanding and is in agreement w/ plan.

## 2014-02-26 NOTE — Assessment & Plan Note (Signed)
Chronic problem.  Pt is interested in stopping her atorvastatin due to recent reports she has read about side effects.  Reviewed her labs from July 2015 that showed LDL of 195 and discussed that w/ her hx of vascular events and diabetes, her LDL goal was 70.  Pt begrudgingly is willing to continue statin at this time.  Applauded her decision.

## 2014-02-27 ENCOUNTER — Encounter: Payer: Self-pay | Admitting: General Practice

## 2014-02-27 ENCOUNTER — Other Ambulatory Visit: Payer: Self-pay | Admitting: Family Medicine

## 2014-02-27 DIAGNOSIS — R945 Abnormal results of liver function studies: Principal | ICD-10-CM

## 2014-02-27 DIAGNOSIS — R7989 Other specified abnormal findings of blood chemistry: Secondary | ICD-10-CM

## 2014-04-04 ENCOUNTER — Ambulatory Visit: Payer: Medicare Other | Admitting: Family Medicine

## 2014-05-03 ENCOUNTER — Other Ambulatory Visit: Payer: Self-pay | Admitting: Nephrology

## 2014-05-03 DIAGNOSIS — N183 Chronic kidney disease, stage 3 unspecified: Secondary | ICD-10-CM

## 2014-05-09 ENCOUNTER — Ambulatory Visit
Admission: RE | Admit: 2014-05-09 | Discharge: 2014-05-09 | Disposition: A | Discharge status: Home / Self Care | Payer: Medicare Other | Source: Ambulatory Visit | Attending: Nephrology | Admitting: Nephrology

## 2014-05-09 DIAGNOSIS — N183 Chronic kidney disease, stage 3 unspecified: Secondary | ICD-10-CM

## 2014-05-15 ENCOUNTER — Other Ambulatory Visit: Payer: Self-pay | Admitting: Nephrology

## 2014-05-15 DIAGNOSIS — I159 Secondary hypertension, unspecified: Secondary | ICD-10-CM

## 2014-05-15 DIAGNOSIS — N189 Chronic kidney disease, unspecified: Secondary | ICD-10-CM

## 2014-05-17 ENCOUNTER — Encounter: Payer: Self-pay | Admitting: Family Medicine

## 2014-05-17 ENCOUNTER — Ambulatory Visit (INDEPENDENT_AMBULATORY_CARE_PROVIDER_SITE_OTHER): Payer: Medicare Other | Admitting: Family Medicine

## 2014-05-17 VITALS — BP 122/78 | HR 93 | Temp 97.9°F | Resp 16 | Wt 145.2 lb

## 2014-05-17 DIAGNOSIS — N390 Urinary tract infection, site not specified: Secondary | ICD-10-CM | POA: Diagnosis not present

## 2014-05-17 DIAGNOSIS — R1030 Lower abdominal pain, unspecified: Secondary | ICD-10-CM

## 2014-05-17 DIAGNOSIS — R82998 Other abnormal findings in urine: Secondary | ICD-10-CM

## 2014-05-17 HISTORY — DX: Lower abdominal pain, unspecified: R10.30

## 2014-05-17 LAB — POCT URINALYSIS DIPSTICK
BILIRUBIN UA: NEGATIVE
Blood, UA: NEGATIVE
Ketones, UA: NEGATIVE
NITRITE UA: NEGATIVE
PH UA: 5
Protein, UA: NEGATIVE
Spec Grav, UA: 1.025
Urobilinogen, UA: NEGATIVE

## 2014-05-17 MED ORDER — CEPHALEXIN 500 MG PO CAPS: 500.0000 mg | ORAL_CAPSULE | Freq: Two times a day (BID) | ORAL | Status: AC

## 2014-05-17 MED ORDER — HYDROCODONE-ACETAMINOPHEN 5-325 MG PO TABS: 1.0000 | ORAL_TABLET | Freq: Four times a day (QID) | ORAL | Status: DC | PRN

## 2014-05-17 MED ORDER — OMEPRAZOLE 40 MG PO CPDR: 40.0000 mg | DELAYED_RELEASE_CAPSULE | Freq: Every day | ORAL | Status: DC

## 2014-05-17 MED ORDER — METOPROLOL SUCCINATE ER 50 MG PO TB24: 50.0000 mg | ORAL_TABLET | Freq: Every day | ORAL | Status: DC

## 2014-05-17 NOTE — Progress Notes (Signed)
   Subjective:    Patient ID: Oline Knapke, female    DOB: 1933/07/04, 81 y.o.   MRN: [id number]  HPI Suprapubic pain- sxs started 2 nights ago when she had to wake up and urinate, 'but I didn't do much'.  Then developed suprapubic pain throughout the night.  Pain eased off yesterday.  No changed in frequency, + hesitancy.  No dysuria.  No fevers.  Mild nausea this AM, no vomiting.  No diarrhea.   Review of Systems For ROS see HPI     Objective:   Physical Exam  Constitutional: She is oriented to person, place, and time. She appears well-developed and well-nourished. No distress.  HENT:  Head: Normocephalic and atraumatic.  Abdominal: Soft. Bowel sounds are normal. She exhibits no distension. There is tenderness (suprapubic but no CVA tenderness). There is no rebound and no guarding.  Neurological: She is alert and oriented to person, place, and time.  Skin: Skin is warm and dry. No rash noted. No erythema.  Psychiatric: She has a normal mood and affect. Her behavior is normal. Thought content normal.  Vitals reviewed.         Assessment & Plan:

## 2014-05-17 NOTE — Assessment & Plan Note (Signed)
New.  Pt's pain has nearly resolved but continues to have urinary hesitancy.  UA consistent w/ infxn.  Start abx.  Will adjust tx based on cx results.  Pt expressed understanding and is in agreement w/ plan.

## 2014-05-17 NOTE — Patient Instructions (Signed)
Follow up as scheduled or sooner if needed Drink plenty of fluids Start the keflex twice daily as directed Call with any questions or concerns Hang In There!!!

## 2014-05-17 NOTE — Addendum Note (Signed)
Addended by: Modena Morrow D on: 05/17/2014 10:06 AM   Modules accepted: Orders

## 2014-05-18 ENCOUNTER — Other Ambulatory Visit: Payer: Medicare Other

## 2014-05-18 LAB — URINE CULTURE: Colony Count: >=100000

## 2014-05-21 ENCOUNTER — Encounter (HOSPITAL_BASED_OUTPATIENT_CLINIC_OR_DEPARTMENT_OTHER): Payer: Self-pay

## 2014-05-21 ENCOUNTER — Emergency Department (HOSPITAL_BASED_OUTPATIENT_CLINIC_OR_DEPARTMENT_OTHER)
Admission: EM | Admit: 2014-05-21 | Discharge: 2014-05-21 | Disposition: A | Discharge status: Home / Self Care | Payer: Medicare Other | Attending: Emergency Medicine | Admitting: Emergency Medicine

## 2014-05-21 ENCOUNTER — Encounter: Payer: Self-pay | Admitting: Family Medicine

## 2014-05-21 ENCOUNTER — Ambulatory Visit (INDEPENDENT_AMBULATORY_CARE_PROVIDER_SITE_OTHER): Payer: Medicare Other | Admitting: Family

## 2014-05-21 ENCOUNTER — Other Ambulatory Visit: Payer: Self-pay

## 2014-05-21 ENCOUNTER — Encounter: Payer: Self-pay | Admitting: Family

## 2014-05-21 VITALS — BP 100/56 | HR 100 | Temp 97.6°F | Resp 16

## 2014-05-21 DIAGNOSIS — E119 Type 2 diabetes mellitus without complications: Secondary | ICD-10-CM | POA: Insufficient documentation

## 2014-05-21 DIAGNOSIS — E785 Hyperlipidemia, unspecified: Secondary | ICD-10-CM | POA: Insufficient documentation

## 2014-05-21 DIAGNOSIS — F419 Anxiety disorder, unspecified: Secondary | ICD-10-CM | POA: Insufficient documentation

## 2014-05-21 DIAGNOSIS — M8580 Other specified disorders of bone density and structure, unspecified site: Secondary | ICD-10-CM | POA: Insufficient documentation

## 2014-05-21 DIAGNOSIS — K219 Gastro-esophageal reflux disease without esophagitis: Secondary | ICD-10-CM | POA: Insufficient documentation

## 2014-05-21 DIAGNOSIS — Z8673 Personal history of transient ischemic attack (TIA), and cerebral infarction without residual deficits: Secondary | ICD-10-CM | POA: Insufficient documentation

## 2014-05-21 DIAGNOSIS — Z794 Long term (current) use of insulin: Secondary | ICD-10-CM | POA: Insufficient documentation

## 2014-05-21 DIAGNOSIS — R112 Nausea with vomiting, unspecified: Secondary | ICD-10-CM | POA: Diagnosis not present

## 2014-05-21 DIAGNOSIS — M1990 Unspecified osteoarthritis, unspecified site: Secondary | ICD-10-CM | POA: Diagnosis not present

## 2014-05-21 DIAGNOSIS — Z792 Long term (current) use of antibiotics: Secondary | ICD-10-CM | POA: Insufficient documentation

## 2014-05-21 DIAGNOSIS — I252 Old myocardial infarction: Secondary | ICD-10-CM | POA: Diagnosis not present

## 2014-05-21 DIAGNOSIS — E860 Dehydration: Secondary | ICD-10-CM

## 2014-05-21 DIAGNOSIS — I10 Essential (primary) hypertension: Secondary | ICD-10-CM | POA: Insufficient documentation

## 2014-05-21 DIAGNOSIS — M5430 Sciatica, unspecified side: Secondary | ICD-10-CM | POA: Diagnosis not present

## 2014-05-21 DIAGNOSIS — D72819 Decreased white blood cell count, unspecified: Secondary | ICD-10-CM | POA: Diagnosis not present

## 2014-05-21 DIAGNOSIS — Z7982 Long term (current) use of aspirin: Secondary | ICD-10-CM | POA: Insufficient documentation

## 2014-05-21 DIAGNOSIS — Z87440 Personal history of urinary (tract) infections: Secondary | ICD-10-CM | POA: Insufficient documentation

## 2014-05-21 DIAGNOSIS — G8929 Other chronic pain: Secondary | ICD-10-CM | POA: Diagnosis not present

## 2014-05-21 DIAGNOSIS — N390 Urinary tract infection, site not specified: Secondary | ICD-10-CM

## 2014-05-21 DIAGNOSIS — R531 Weakness: Secondary | ICD-10-CM | POA: Diagnosis present

## 2014-05-21 DIAGNOSIS — Z79899 Other long term (current) drug therapy: Secondary | ICD-10-CM | POA: Insufficient documentation

## 2014-05-21 LAB — CBC WITH DIFFERENTIAL/PLATELET
Basophils Absolute: 0 10*3/uL (ref 0.0–0.1)
Basophils Relative: 0 % (ref 0–1)
EOS PCT: 1 % (ref 0–5)
Eosinophils Absolute: 0 10*3/uL (ref 0.0–0.7)
HEMATOCRIT: 37 % (ref 36.0–46.0)
HEMOGLOBIN: 12.9 g/dL (ref 12.0–15.0)
LYMPHS ABS: 0.9 10*3/uL (ref 0.7–4.0)
LYMPHS PCT: 40 % (ref 12–46)
MCH: 32.7 pg (ref 26.0–34.0)
MCHC: 34.9 g/dL (ref 30.0–36.0)
MCV: 93.9 fL (ref 78.0–100.0)
Monocytes Absolute: 0.4 10*3/uL (ref 0.1–1.0)
Monocytes Relative: 17 % — ABNORMAL HIGH (ref 3–12)
NEUTROS ABS: 1 10*3/uL — AB (ref 1.7–7.7)
NEUTROS PCT: 42 % — AB (ref 43–77)
Platelets: 167 10*3/uL (ref 150–400)
RBC: 3.94 MIL/uL (ref 3.87–5.11)
RDW: 12.4 % (ref 11.5–15.5)
WBC: 2.3 10*3/uL — ABNORMAL LOW (ref 4.0–10.5)

## 2014-05-21 LAB — COMPREHENSIVE METABOLIC PANEL
ALBUMIN: 4 g/dL (ref 3.5–5.0)
ALT: 32 U/L (ref 14–54)
AST: 39 U/L (ref 15–41)
Alkaline Phosphatase: 73 U/L (ref 38–126)
Anion gap: 8 (ref 5–15)
BUN: 31 mg/dL — ABNORMAL HIGH (ref 6–20)
CO2: 24 mmol/L (ref 22–32)
CREATININE: 1.86 mg/dL — AB (ref 0.44–1.00)
Calcium: 8.9 mg/dL (ref 8.9–10.3)
Chloride: 101 mmol/L (ref 101–111)
GFR calc Af Amer: 29 mL/min — ABNORMAL LOW (ref >60)
GFR calc non Af Amer: 25 mL/min — ABNORMAL LOW (ref >60)
GLUCOSE: 256 mg/dL — AB (ref 70–99)
Potassium: 4.2 mmol/L (ref 3.5–5.1)
SODIUM: 133 mmol/L — AB (ref 135–145)
Total Bilirubin: 0.8 mg/dL (ref 0.3–1.2)
Total Protein: 7.5 g/dL (ref 6.5–8.1)

## 2014-05-21 LAB — POCT URINALYSIS DIPSTICK
Bilirubin, UA: NEGATIVE
Blood, UA: NEGATIVE
Ketones, UA: NEGATIVE
LEUKOCYTES UA: NEGATIVE
Nitrite, UA: NEGATIVE
PH UA: 5
Urobilinogen, UA: NEGATIVE

## 2014-05-21 MED ORDER — ONDANSETRON HCL 4 MG/2ML IJ SOLN
4.0000 mg | Freq: Once | INTRAMUSCULAR | Status: AC
  Administered 2014-05-21: 4 mg via INTRAVENOUS
  Filled 2014-05-21: qty 2

## 2014-05-21 MED ORDER — SODIUM CHLORIDE 0.9 % IV BOLUS (SEPSIS)
1000.0000 mL | Freq: Once | INTRAVENOUS | Status: AC
  Administered 2014-05-21: 1000 mL via INTRAVENOUS

## 2014-05-21 NOTE — ED Notes (Signed)
Pt seen by PMD last week, put on abx for UTI. C/o increased weakness and fatigue since Thursday. Some nausea and vomiting.

## 2014-05-21 NOTE — Progress Notes (Signed)
Pre visit review using our clinic review tool, if applicable. No additional management support is needed unless otherwise documented below in the visit note. 

## 2014-05-21 NOTE — Assessment & Plan Note (Signed)
UTI symptoms seem to be better (dip unremarkable today except for protein), however she has now developed diarrhea (concern for c. Diff) and is not eating or drinking. BP is low for her.  I think she needs some IV fluids and baseline labs.

## 2014-05-21 NOTE — ED Notes (Signed)
Patient continues to tolerate PO fluids without difficulties.  Patient states she is ready to go.

## 2014-05-21 NOTE — ED Notes (Signed)
Patient given small can of diet ginger ale for PO challenge

## 2014-05-21 NOTE — ED Provider Notes (Signed)
CSN: [id number]     Arrival date & time 05/21/14  1725 History  This chart was scribed for Malvin Johns, MD by Molli Posey, ED Scribe. This patient was seen in room MH10/MH10 and the patient's care was started 5:56 PM.   Chief Complaint  Patient presents with  . Weakness   The history is provided by the patient. No language interpreter was used.   HPI Comments: Sandra [name] is a 81 y.o. female with a history of DM, HTN, hyperlipidemia, acute MI and TIA who presents to the Emergency Department complaining of generalized weakness for the last 5 days. Pt reports associated diarrhea for the last 3 days, nausea and vomiting that started yesterday. She reports 3 episodes of diarrhea yesterday and 1 episode this morning. Pt states that she was seen by her PCP last week and was put on Keflex for UTI 6 days ago. Her symptoms of nausea and diarrhea started after starting the abx.  She states that she has had a decrease in appetite since that time and says she has not eaten today. Pt states that she had a fever with a temperature of 100.8 last night. Pt states that she saw her endocrinologist today and says her A1C was 11.5. She then went to her PCP who rechecked her urine which was clear, but sent her to ED for possible dehydration.  Pt denies any prior episodes of heart failure. She denies rhinorrhea, chest congestion, leg swelling, current abdominal pain, CP or SOB.   Past Medical History  Diagnosis Date  . Depression   . GERD (gastroesophageal reflux disease)   . Hyperlipidemia   . Diabetes mellitus     type 2  . Hypertension   . Osteopenia   . Allergic rhinitis   . Takotsubo cardiomyopathy     with subsequent normalization of left ventricular systolic function  . Hyperlipidemia   . Anxiety   . Acute MI   . Valvular heart disease   . Acid reflux   . Degenerative joint disease   . Neck pain, chronic   . Sciatica   . Gait disturbance   . SAH (subarachnoid hemorrhage)   . TIA (transient  ischemic attack)    Past Surgical History  Procedure Laterality Date  . Abdominal hysterectomy  1977    no oophorectomy  . Spinal fusion  2000    Dr. Vertell Limber  . Cataract extraction, bilateral  2011   Family History  Problem Relation Age of Onset  . Diabetes Sister   . Diabetes Sister   . Breast cancer      ?Aunt  . Cancer      breast?  . Intracerebral hemorrhage Daughter     assoc with post-partum  . Coronary artery disease Neg Hx    History  Substance Use Topics  . Smoking status: Never Smoker   . Smokeless tobacco: Never Used  . Alcohol Use: No   OB History    No data available     Review of Systems  Constitutional: Positive for fever, appetite change and fatigue.  HENT: Negative for congestion and rhinorrhea.   Respiratory: Negative for cough and shortness of breath.   Cardiovascular: Negative for chest pain and leg swelling.  Gastrointestinal: Positive for nausea, vomiting and diarrhea. Negative for abdominal pain.  Neurological: Positive for weakness.  All other systems reviewed and are negative.  Allergies  Pioglitazone  Home Medications   Prior to Admission medications   Medication Sig Start Date End Date Taking? Authorizing  Provider  aspirin 81 MG tablet Take 81 mg by mouth daily.    Historical Provider, MD  atorvastatin (LIPITOR) 10 MG tablet Take 10 mg by mouth daily.    Historical Provider, MD  Calcium-Magnesium-Vitamin D (CALCIUM 1200+D3 PO) Take 1 tablet by mouth daily. Vitamin d is 1000iu    Historical Provider, MD  cephALEXin (KEFLEX) 500 MG capsule Take 1 capsule (500 mg total) by mouth 2 (two) times daily. 05/17/14 05/27/14  Midge Minium, MD  clonazePAM (KLONOPIN) 0.5 MG tablet Take 1 tablet (0.5 mg total) by mouth 2 (two) times daily as needed for anxiety. 02/26/14   Midge Minium, MD  fenofibrate 54 MG tablet Take 54 mg by mouth daily.  01/30/14   Historical Provider, MD  glimepiride (AMARYL) 4 MG tablet Take 2 mg by mouth 2 (two) times  daily.  02/19/14   Historical Provider, MD  HYDROcodone-acetaminophen (NORCO/VICODIN) 5-325 MG per tablet Take 1 tablet by mouth every 6 (six) hours as needed for moderate pain. 05/17/14   Midge Minium, MD  Insulin Glargine 300 UNIT/ML SOPN Inject 33 Units into the skin daily. Name brand toujeo at night    Historical Provider, MD  Insulin Lispro, Human, (HUMALOG [place]) Inject into the skin. Take 6 units before meals three times daily    Historical Provider, MD  lisinopril (PRINIVIL,ZESTRIL) 10 MG tablet Take 10 mg by mouth daily.     Historical Provider, MD  metoprolol succinate (TOPROL-XL) 50 MG 24 hr tablet Take 1 tablet (50 mg total) by mouth daily. Take with or immediately following a meal. 05/17/14   Midge Minium, MD  omeprazole (PRILOSEC) 40 MG capsule Take 1 capsule (40 mg total) by mouth daily. 05/17/14   Midge Minium, MD  ondansetron (ZOFRAN ODT) 4 MG disintegrating tablet Take 1 tablet (4 mg total) by mouth every 8 (eight) hours as needed for nausea or vomiting. 09/22/13   Midge Minium, MD   BP 129/47 mmHg  Pulse 80  Temp(Src) 98.6 F (37 C) (Oral)  Resp 18  Ht 5\' 1"  (1.549 m)  Wt 142 lb (64.411 kg)  BMI 26.84 kg/m2  SpO2 99% Physical Exam  Constitutional: She is oriented to person, place, and time. She appears well-developed and well-nourished.  HENT:  Head: Normocephalic and atraumatic.  Eyes: Pupils are equal, round, and reactive to light. Right eye exhibits no discharge. Left eye exhibits no discharge.  Neck: Normal range of motion. Neck supple. No tracheal deviation present.  Cardiovascular: Normal rate, regular rhythm and normal heart sounds.   Pulmonary/Chest: Effort normal and breath sounds normal. No respiratory distress. She has no wheezes. She has no rales. She exhibits no tenderness.  Abdominal: Soft. Bowel sounds are normal. She exhibits no distension. There is no tenderness. There is no rebound and no guarding.  Musculoskeletal: Normal range of motion.  She exhibits no edema.  Lymphadenopathy:    She has no cervical adenopathy.  Neurological: She is alert and oriented to person, place, and time. She has normal strength. No cranial nerve deficit or sensory deficit. GCS eye subscore is 4. GCS verbal subscore is 5. GCS motor subscore is 6.  Skin: Skin is warm and dry. No rash noted.  Psychiatric: She has a normal mood and affect. Her behavior is normal.  Nursing note and vitals reviewed.   ED Course  Procedures   DIAGNOSTIC STUDIES: Oxygen Saturation is 100% on RA, normal by my interpretation.    COORDINATION OF CARE: 6:05 PM  Discussed treatment plan with pt at bedside and pt agreed to plan.   Labs Review Labs Reviewed  COMPREHENSIVE METABOLIC PANEL - Abnormal; Notable for the following:    Sodium 133 (*)    Glucose, Bld 256 (*)    BUN 31 (*)    Creatinine, Ser 1.86 (*)    GFR calc non Af Amer 25 (*)    GFR calc Af Amer 29 (*)    All other components within normal limits  CBC WITH DIFFERENTIAL/PLATELET - Abnormal; Notable for the following:    WBC 2.3 (*)    Neutrophils Relative % 42 (*)    Monocytes Relative 17 (*)    Neutro Abs 1.0 (*)    All other components within normal limits    Imaging Review No results found.   EKG Interpretation   Date/Time:  Monday May 21 2014 18:33:37 EDT Ventricular Rate:  87 PR Interval:  134 QRS Duration: 90 QT Interval:  362 QTC Calculation: 435 R Axis:   43 Text Interpretation:  Normal sinus rhythm Nonspecific ST and T wave  abnormality Abnormal ECG since last tracing no significant change  Confirmed by Jakyron Fabro  MD, Assata Juncaj ([id number]) on 05/21/2014 7:53:07 PM      MDM   Final diagnoses:  Dehydration  Leukocytopenia   Patient is given 1 L IV fluids in the ED. Also given dose of Zofran. She's feeling much better and tolerating by mouth fluids. She feels like she is ready to go home. Her urine was rechecked earlier today and does not appear to be infected. It was sent for culture which  is pending. Her labs are unremarkable other than some elevation in her creatinine which is just slightly above her baseline. Her white blood cell count is low at 2.3. This could represent a viral infection but she does need to have it rechecked by her PCP. I did advise the patient of this. I will schedule this in her stool for C. difficile testing but she did not have any stools in the ED. She was discharged home in good condition advised to make an appointment to follow-up with her primary care provider within the next week and return here as needed for any worsening symptoms.  I personally performed the services described in this documentation, which was scribed in my presence.  The recorded information has been reviewed and considered.      Malvin Johns, MD 05/21/14 2159

## 2014-05-21 NOTE — Progress Notes (Signed)
Subjective:    Patient ID: Sandra [name], female    DOB: [birth year], 81 y.o.   MRN: [id number]  HPI  Sandra [name] is a 81 yr old female who presents today with UTI.  She was seen by Dr. Birdie Riddle on 05/17/14 and was diagnosed with UTI. Urine culture grew >100K multiple morphotypes. She was given a 5 day course of keflex.  She completed keflex 2 days ago.  She stopped abx after 4 days because she "thought it was making her sick." Reports that she has been in the bed for 4 days.  She reports that she had one loose stool this AM. Was "throwing up last night."  Today she has not been eating. She has only had a few sips of water.  Reports temp 100.8 last night.  She reports that she feels worse than she did the day that she came in. She reports some low back pain and thoracic.,    Review of Systems    see HPI  Past Medical History  Diagnosis Date  . Depression   . GERD (gastroesophageal reflux disease)   . Hyperlipidemia   . Diabetes mellitus     type 2  . Hypertension   . Osteopenia   . Allergic rhinitis   . Takotsubo cardiomyopathy     with subsequent normalization of left ventricular systolic function  . Hyperlipidemia   . Anxiety   . Acute MI   . Valvular heart disease   . Acid reflux   . Degenerative joint disease   . Neck pain, chronic   . Sciatica   . Gait disturbance   . SAH (subarachnoid hemorrhage)   . TIA (transient ischemic attack)     History   Social History  . Marital Status: Divorced    Spouse Name: N/A  . Number of Children: 2  . Years of Education: N/A   Occupational History  . Not on file.   Social History Main Topics  . Smoking status: Never Smoker   . Smokeless tobacco: Never Used  . Alcohol Use: No  . Drug Use: No  . Sexual Activity: No   Other Topics Concern  . Not on file   Social History Narrative   Lives with her daughter and G-daughter   Diet- working on portion control   Exercise- no routine exercise but active    Past Surgical  History  Procedure Laterality Date  . Abdominal hysterectomy  1977    no oophorectomy  . Spinal fusion  2000    Dr. Vertell Limber  . Cataract extraction, bilateral  2011    Family History  Problem Relation Age of Onset  . Diabetes Sister   . Diabetes Sister   . Breast cancer      ?Aunt  . Cancer      breast?  . Intracerebral hemorrhage Daughter     assoc with post-partum  . Coronary artery disease Neg Hx     Allergies  Allergen Reactions  . Pioglitazone     REACTION: EDEMA    Current Outpatient Prescriptions on File Prior to Visit  Medication Sig Dispense Refill  . aspirin 81 MG tablet Take 81 mg by mouth daily.    Marland Kitchen atorvastatin (LIPITOR) 10 MG tablet Take 10 mg by mouth daily.    . Calcium-Magnesium-Vitamin D (CALCIUM 1200+D3 PO) Take 1 tablet by mouth daily. Vitamin d is 1000iu    . cephALEXin (KEFLEX) 500 MG capsule Take 1 capsule (500 mg total) by mouth 2 (two)  times daily. 10 capsule 0  . clonazePAM (KLONOPIN) 0.5 MG tablet Take 1 tablet (0.5 mg total) by mouth 2 (two) times daily as needed for anxiety. 30 tablet 0  . fenofibrate 54 MG tablet Take 54 mg by mouth daily.   4  . glimepiride (AMARYL) 4 MG tablet Take 2 mg by mouth 2 (two) times daily.   2  . HYDROcodone-acetaminophen (NORCO/VICODIN) 5-325 MG per tablet Take 1 tablet by mouth every 6 (six) hours as needed for moderate pain. 45 tablet 0  . Insulin Glargine 300 UNIT/ML SOPN Inject 33 Units into the skin daily. Name brand toujeo at night    . Insulin Lispro, Human, (HUMALOG [place]) Inject into the skin. Take 6 units before meals three times daily    . lisinopril (PRINIVIL,ZESTRIL) 10 MG tablet Take 10 mg by mouth daily.     . metoprolol succinate (TOPROL-XL) 50 MG 24 hr tablet Take 1 tablet (50 mg total) by mouth daily. Take with or immediately following a meal. 30 tablet 6  . omeprazole (PRILOSEC) 40 MG capsule Take 1 capsule (40 mg total) by mouth daily. 30 capsule 6  . ondansetron (ZOFRAN ODT) 4 MG disintegrating  tablet Take 1 tablet (4 mg total) by mouth every 8 (eight) hours as needed for nausea or vomiting. 30 tablet 0   No current facility-administered medications on file prior to visit.    BP 100/56 mmHg  Pulse 100  Temp(Src) 97.6 F (36.4 C) (Oral)  Resp 16  SpO2 98%    Objective:   Physical Exam  Constitutional: She is oriented to person, place, and time.  Pale, weak appearing elderly white female.    HENT:  Head: Normocephalic and atraumatic.  Dry oral mucosa noted  Cardiovascular: Normal rate, regular rhythm and normal heart sounds.   No murmur heard. Pulmonary/Chest: Effort normal and breath sounds normal. No respiratory distress. She has no wheezes.  Abdominal: Soft. Bowel sounds are normal. There is no CVA tenderness.  Musculoskeletal: She exhibits no edema.  Lymphadenopathy:    She has no cervical adenopathy.  Neurological: She is alert and oriented to person, place, and time.  Skin: Skin is warm.  Psychiatric: She has a normal mood and affect. Her behavior is normal. Judgment and thought content normal.          Assessment & Plan:  Spoke to pt's daughter [name] and informed her that pt will be sent to the [place] ED. She will be wheeled down by CMA to the ED.   Report given to charge nurse Amy at the [facility] ED.

## 2014-05-21 NOTE — Discharge Instructions (Signed)
Dehydration, Adult Dehydration is when you lose more fluids from the body than you take in. Vital organs like the kidneys, brain, and heart cannot function without a proper amount of fluids and salt. Any loss of fluids from the body can cause dehydration.  CAUSES   Vomiting.  Diarrhea.  Excessive sweating.  Excessive urine output.  Fever. SYMPTOMS  Mild dehydration  Thirst.  Dry lips.  Slightly dry mouth. Moderate dehydration  Very dry mouth.  Sunken eyes.  Skin does not bounce back quickly when lightly pinched and released.  Dark urine and decreased urine production.  Decreased tear production.  Headache. Severe dehydration  Very dry mouth.  Extreme thirst.  Rapid, weak pulse (more than 100 beats per minute at rest).  Cold hands and feet.  Not able to sweat in spite of heat and temperature.  Rapid breathing.  Blue lips.  Confusion and lethargy.  Difficulty being awakened.  Minimal urine production.  No tears. DIAGNOSIS  Your caregiver will diagnose dehydration based on your symptoms and your exam. Blood and urine tests will help confirm the diagnosis. The diagnostic evaluation should also identify the cause of dehydration. TREATMENT  Treatment of mild or moderate dehydration can often be done at home by increasing the amount of fluids that you drink. It is best to drink small amounts of fluid more often. Drinking too much at one time can make vomiting worse. Refer to the home care instructions below. Severe dehydration needs to be treated at the hospital where you will probably be given intravenous (IV) fluids that contain water and electrolytes. HOME CARE INSTRUCTIONS   Ask your caregiver about specific rehydration instructions.  Drink enough fluids to keep your urine clear or pale yellow.  Drink small amounts frequently if you have nausea and vomiting.  Eat as you normally do.  Avoid:  Foods or drinks high in sugar.  Carbonated  drinks.  Juice.  Extremely hot or cold fluids.  Drinks with caffeine.  Fatty, greasy foods.  Alcohol.  Tobacco.  Overeating.  Gelatin desserts.  Wash your hands well to avoid spreading bacteria and viruses.  Only take over-the-counter or prescription medicines for pain, discomfort, or fever as directed by your caregiver.  Ask your caregiver if you should continue all prescribed and over-the-counter medicines.  Keep all follow-up appointments with your caregiver. SEEK MEDICAL CARE IF:  You have abdominal pain and it increases or stays in one area (localizes).  You have a rash, stiff neck, or severe headache.  You are irritable, sleepy, or difficult to awaken.  You are weak, dizzy, or extremely thirsty. SEEK IMMEDIATE MEDICAL CARE IF:   You are unable to keep fluids down or you get worse despite treatment.  You have frequent episodes of vomiting or diarrhea.  You have blood or green matter (bile) in your vomit.  You have blood in your stool or your stool looks black and tarry.  You have not urinated in 6 to 8 hours, or you have only urinated a small amount of very dark urine.  You have a fever.  You faint. MAKE SURE YOU:   Understand these instructions.  Will watch your condition.  Will get help right away if you are not doing well or get worse. Document Released: 01/05/2005 Document Revised: 03/30/2011 Document Reviewed: 08/25/2010 [facility] Patient Information 2015 [place], [place]. This information is not intended to replace advice given to you by your health care provider. Make sure you discuss any questions you have with your health care  provider.  Leukopenia Leukopenia is a condition in which you have a low number of white blood cells. White blood cells help your body fight infections. The number of white blood cells in the body varies from person to person. Leukopenia is usually defined as having fewer than 4,000 white blood cells in 1 microliter of  blood. There are five types of white blood cells. Two types make up most of your white blood cell count. These are neutrophils and lymphocytes. When your level of neutrophils is low, it is called neutropenia. When your lymphocytes are low, it is called lymphocytopenia. Neutropenia is the most dangerous type of leukopenia because it can lead to dangerous infections. CAUSES  Most white blood cells are made in the soft tissue inside your bones (bone marrow). Conditions that damage or suppress bone marrow are the most common causes of leukopenia. These include:  Medicine or X-ray treatments for cancer.  Serious infections.  Cancer of the white blood cells (leukemia or myeloma).  Medicines, including antibiotics, cardiac drugs, steroids, and those used to treat rheumatoid arthritis. Leukopenia also happens when white blood cells are destroyed after leaving your bone marrow. Causes may include:  Liver disease.  Diseases of the immune system (autoimmune disease).  Vitamin B deficiencies. SIGNS AND SYMPTOMS One of the most common signs of leukopenia, especially severe neutropenia, is having a lot of bacterial infections. Different infections have different symptoms. An infection in your lungs may cause coughing. A urinary tract infection may cause frequent urination and a burning sensation. You may also get infections of the blood, skin, rectum, throat, sinus, or ear. General signs and symptoms of leukopenia include:  Fever.  Fatigue.  Swollen glands (lymph nodes).  Painful mouth ulcers.  Gum disease. DIAGNOSIS  Your health care provider can diagnose leukopenia based on a physical exam and the results of lab tests. During a physical exam, your health care provider will feel for swollen lymph nodes and check whether your spleen is enlarged. Your spleen is an organ on the left side of your body that stores white blood cells. Tests that may be done include:  A complete blood count. This blood  test counts each type of white cell.  Bone marrow aspiration. Some bone marrow is removed to be checked under a microscope.  Lymph node biopsy. Some lymph node tissue is removed to be checked under a microscope.  Other types of blood tests or imaging tests. TREATMENT  Treatment of leukopenia depends on the cause. Some common treatments include:  Antibiotics for bacterial infections.  No longer taking medicines that may cause leukopenia.  Vitamin B supplements.  Medicines to stimulate neutrophil production (hematopoietic growth factors) for neutropenia. HOME CARE INSTRUCTIONS  Preventing infection is important if you have leukopenia.  Avoid sick friends and family members.  Wash your hands often.  Do noteat uncooked or undercooked meats.  Wash fruits and vegetables.  Do not eat or drink unpasteurized dairy products.  Get regular dental care, and maintain good dental hygiene.  Keep all follow-up appointments. SEEK MEDICAL CARE IF:  You have chills or a fever.  You have signs or symptoms of infection. SEEK IMMEDIATE MEDICAL CARE IF:  You have a fever or persistent symptoms for more than 2-3 days.  You have trouble breathing.  You have chest pain. MAKE SURE YOU:  Understand these instructions.  Will watch your condition.  Will get help right away if you are not doing well or get worse. Document Released: 01/10/2013 Document Reviewed: 01/10/2013  [employer]® Patient Information ©2015 [employer]. This information is not intended to replace advice given to you by your health care provider. Make sure you discuss any questions you have with your health care provider. ° °

## 2014-05-24 LAB — URINE CULTURE
Colony Count: NO GROWTH
ORGANISM ID, BACTERIA: NO GROWTH

## 2014-05-31 ENCOUNTER — Ambulatory Visit (INDEPENDENT_AMBULATORY_CARE_PROVIDER_SITE_OTHER): Payer: Medicare Other | Admitting: Family Medicine

## 2014-05-31 ENCOUNTER — Encounter: Payer: Self-pay | Admitting: Family Medicine

## 2014-05-31 VITALS — BP 108/68 | HR 78 | Temp 97.9°F | Resp 16 | Wt 141.2 lb

## 2014-05-31 DIAGNOSIS — R5383 Other fatigue: Secondary | ICD-10-CM | POA: Diagnosis not present

## 2014-05-31 DIAGNOSIS — D72819 Decreased white blood cell count, unspecified: Secondary | ICD-10-CM

## 2014-05-31 DIAGNOSIS — N183 Chronic kidney disease, stage 3 unspecified: Secondary | ICD-10-CM | POA: Insufficient documentation

## 2014-05-31 DIAGNOSIS — R531 Weakness: Secondary | ICD-10-CM | POA: Insufficient documentation

## 2014-05-31 HISTORY — DX: Decreased white blood cell count, unspecified: D72.819

## 2014-05-31 HISTORY — DX: Chronic kidney disease, stage 3 unspecified: N18.30

## 2014-05-31 HISTORY — DX: Other fatigue: R53.83

## 2014-05-31 LAB — CBC WITH DIFFERENTIAL/PLATELET
BASOS PCT: 0.6 % (ref 0.0–3.0)
Basophils Absolute: 0 10*3/uL (ref 0.0–0.1)
EOS PCT: 1.2 % (ref 0.0–5.0)
Eosinophils Absolute: 0 10*3/uL (ref 0.0–0.7)
HCT: 33.3 % — ABNORMAL LOW (ref 36.0–46.0)
HEMOGLOBIN: 11.7 g/dL — AB (ref 12.0–15.0)
LYMPHS PCT: 44.8 % (ref 12.0–46.0)
Lymphs Abs: 1.8 10*3/uL (ref 0.7–4.0)
MCHC: 35.1 g/dL (ref 30.0–36.0)
MCV: 91.2 fl (ref 78.0–100.0)
MONOS PCT: 12.4 % — AB (ref 3.0–12.0)
Monocytes Absolute: 0.5 10*3/uL (ref 0.1–1.0)
NEUTROS ABS: 1.6 10*3/uL (ref 1.4–7.7)
NEUTROS PCT: 41 % — AB (ref 43.0–77.0)
Platelets: 315 10*3/uL (ref 150.0–400.0)
RBC: 3.65 Mil/uL — AB (ref 3.87–5.11)
RDW: 12.9 % (ref 11.5–15.5)
WBC: 4 10*3/uL (ref 4.0–10.5)

## 2014-05-31 LAB — BASIC METABOLIC PANEL
BUN: 31 mg/dL — AB (ref 6–23)
CALCIUM: 9.7 mg/dL (ref 8.4–10.5)
CO2: 28 mEq/L (ref 19–32)
Chloride: 103 mEq/L (ref 96–112)
Creatinine, Ser: 1.36 mg/dL — ABNORMAL HIGH (ref 0.40–1.20)
GFR: 39.93 mL/min — AB (ref >60.00)
GLUCOSE: 210 mg/dL — AB (ref 70–99)
Potassium: 4.4 mEq/L (ref 3.5–5.1)
Sodium: 135 mEq/L (ref 135–145)

## 2014-05-31 LAB — B12 AND FOLATE PANEL
FOLATE: 11.9 ng/mL (ref >5.9)
Vitamin B-12: 296 pg/mL (ref 211–911)

## 2014-05-31 LAB — TSH: TSH: 1.34 u[IU]/mL (ref 0.35–4.50)

## 2014-05-31 NOTE — Progress Notes (Signed)
Pre visit review using our clinic review tool, if applicable. No additional management support is needed unless otherwise documented below in the visit note. 

## 2014-05-31 NOTE — Assessment & Plan Note (Signed)
New to provider.  Discussed w/ pt that her A1C of 11.5 could be causing her fatigue as this indicates very poor sugar control.  Will check TSH, B12, folate, CBC to assess for underlying metabolic abnormalities.  Will continue to follow.

## 2014-05-31 NOTE — Patient Instructions (Signed)
Follow up as needed We'll notify you of your lab results and make any changes if needed Continue to rest and recover Call with any questions or concerns Hang in there!!!

## 2014-05-31 NOTE — Assessment & Plan Note (Signed)
Chronic problem, seeing Dr Arty Baumgartner at [facility].  Cr was mildly increased during ER visit and this was thought to be due to dehydration.  Repeat BMP today to determine if pt is back to baseline.

## 2014-05-31 NOTE — Progress Notes (Signed)
   Subjective:    Patient ID: Sandra [name], female    DOB: 1933-11-26, 81 y.o.   MRN: [id number]  HPI ER f/u- pt was seen in office on 5/2 and sent to ER for possible dehydration due to N/V/D caused by Keflex started for UTI.  No longer having N/V/D.  Pt reports she is feeling better but continues to have fatigue.  Pt's GI sxs started when she began the keflex.  In ER, pt was found to have low WBC and mildly elevated Cr.  Pt went to see Endo at [facility] and A1C was 11.5  He increased her Tujeo to 40units.  Pt is most concerned about the fatigue at this time.  Denies CP, SOB, HAs, visual changes.   Review of Systems For ROS see HPI     Objective:   Physical Exam  Constitutional: She is oriented to person, place, and time. She appears well-developed and well-nourished. No distress.  HENT:  Head: Normocephalic and atraumatic.  Eyes: Conjunctivae and EOM are normal. Pupils are equal, round, and reactive to light.  Neck: Normal range of motion. Neck supple. No thyromegaly present.  Cardiovascular: Normal rate, regular rhythm, normal heart sounds and intact distal pulses.   No murmur heard. Pulmonary/Chest: Effort normal and breath sounds normal. No respiratory distress.  Abdominal: Soft. She exhibits no distension. There is no tenderness.  Musculoskeletal: She exhibits no edema.  Lymphadenopathy:    She has no cervical adenopathy.  Neurological: She is alert and oriented to person, place, and time.  Skin: Skin is warm and dry.  Psychiatric: She has a normal mood and affect. Her behavior is normal.  Vitals reviewed.         Assessment & Plan:

## 2014-05-31 NOTE — Assessment & Plan Note (Signed)
New.  Identified on recent ER labs.  Suspected at the time to be a viral illness.  Repeat labs today to assess for normalization of WBC.

## 2014-06-01 ENCOUNTER — Encounter: Payer: Self-pay | Admitting: General Practice

## 2014-07-01 LAB — HM DIABETES EYE EXAM

## 2014-07-06 LAB — HEMOGLOBIN A1C: HEMOGLOBIN A1C: 7.5 % — AB (ref 4.0–6.0)

## 2014-07-09 ENCOUNTER — Encounter: Payer: Self-pay | Admitting: General Practice

## 2014-07-16 ENCOUNTER — Other Ambulatory Visit: Payer: Self-pay | Admitting: Family Medicine

## 2014-07-16 NOTE — Telephone Encounter (Signed)
Med filled.  

## 2014-08-20 ENCOUNTER — Other Ambulatory Visit: Payer: Self-pay | Admitting: General Practice

## 2014-08-20 MED ORDER — CLONAZEPAM 0.5 MG PO TABS: 0.5000 mg | ORAL_TABLET | Freq: Two times a day (BID) | ORAL | Status: DC | PRN

## 2014-08-20 NOTE — Telephone Encounter (Signed)
Medication filled to pharmacy as requested.   

## 2014-08-20 NOTE — Telephone Encounter (Signed)
Last OV 05/31/14 Clonazepam last filled 02/26/14 #30 with 0  CSC, low risk, meds only used PRN

## 2014-09-06 LAB — HEMOGLOBIN A1C: HEMOGLOBIN A1C: 9.6 % — AB (ref 4.0–6.0)

## 2014-09-11 ENCOUNTER — Encounter: Payer: Self-pay | Admitting: General Practice

## 2014-09-25 ENCOUNTER — Encounter: Payer: Self-pay | Admitting: Gastroenterology

## 2014-09-25 ENCOUNTER — Encounter: Payer: Self-pay | Admitting: Internal Medicine

## 2014-10-01 ENCOUNTER — Encounter: Payer: Self-pay | Admitting: Family Medicine

## 2014-10-01 ENCOUNTER — Ambulatory Visit (INDEPENDENT_AMBULATORY_CARE_PROVIDER_SITE_OTHER): Payer: Medicare Other | Admitting: Family Medicine

## 2014-10-01 VITALS — BP 110/70 | HR 81 | Temp 98.0°F | Resp 16 | Wt 147.1 lb

## 2014-10-01 DIAGNOSIS — Z23 Encounter for immunization: Secondary | ICD-10-CM

## 2014-10-01 DIAGNOSIS — E559 Vitamin D deficiency, unspecified: Secondary | ICD-10-CM

## 2014-10-01 DIAGNOSIS — M5431 Sciatica, right side: Secondary | ICD-10-CM | POA: Diagnosis not present

## 2014-10-01 DIAGNOSIS — R5383 Other fatigue: Secondary | ICD-10-CM

## 2014-10-01 DIAGNOSIS — E1169 Type 2 diabetes mellitus with other specified complication: Secondary | ICD-10-CM

## 2014-10-01 DIAGNOSIS — E785 Hyperlipidemia, unspecified: Secondary | ICD-10-CM

## 2014-10-01 HISTORY — DX: Vitamin D deficiency, unspecified: E55.9

## 2014-10-01 LAB — VITAMIN D 25 HYDROXY (VIT D DEFICIENCY, FRACTURES): VITD: 25.29 ng/mL — AB (ref 30.00–100.00)

## 2014-10-01 LAB — LIPID PANEL
CHOLESTEROL: 179 mg/dL (ref 0–200)
HDL: 36.1 mg/dL — ABNORMAL LOW (ref >39.00)
NonHDL: 142.85
Total CHOL/HDL Ratio: 5
Triglycerides: 291 mg/dL — ABNORMAL HIGH (ref 0.0–149.0)
VLDL: 58.2 mg/dL — ABNORMAL HIGH (ref 0.0–40.0)

## 2014-10-01 LAB — BASIC METABOLIC PANEL
BUN: 27 mg/dL — ABNORMAL HIGH (ref 6–23)
CHLORIDE: 102 meq/L (ref 96–112)
CO2: 26 mEq/L (ref 19–32)
Calcium: 9.6 mg/dL (ref 8.4–10.5)
Creatinine, Ser: 1.28 mg/dL — ABNORMAL HIGH (ref 0.40–1.20)
GFR: 42.78 mL/min — ABNORMAL LOW (ref >60.00)
GLUCOSE: 178 mg/dL — AB (ref 70–99)
POTASSIUM: 4.7 meq/L (ref 3.5–5.1)
SODIUM: 137 meq/L (ref 135–145)

## 2014-10-01 LAB — HEPATIC FUNCTION PANEL
ALBUMIN: 4.3 g/dL (ref 3.5–5.2)
ALK PHOS: 62 U/L (ref 39–117)
ALT: 14 U/L (ref 0–35)
AST: 17 U/L (ref 0–37)
Bilirubin, Direct: 0.1 mg/dL (ref 0.0–0.3)
Total Bilirubin: 0.4 mg/dL (ref 0.2–1.2)
Total Protein: 7.9 g/dL (ref 6.0–8.3)

## 2014-10-01 LAB — B12 AND FOLATE PANEL
Folate: 15.9 ng/mL (ref >5.9)
Vitamin B-12: >1500 pg/mL — ABNORMAL HIGH (ref 211–911)

## 2014-10-01 LAB — TSH: TSH: 1.86 u[IU]/mL (ref 0.35–4.50)

## 2014-10-01 LAB — LDL CHOLESTEROL, DIRECT: LDL DIRECT: 109 mg/dL

## 2014-10-01 NOTE — Progress Notes (Signed)
   Subjective:    Patient ID: Sandra [name], female    DOB: 1933/06/07, 81 y.o.   MRN: [id number]  HPI 'i'm just weak'- 'i just hurt all over.  Up in here (shoulders) and down here (lumbar spine)'.  Pt reports poor sleep- difficulty falling sleep.  Waking up 2-3x to urinate.  Pt reports her weakness is more a fatigue.  Occasionally has sensation of leg weakness.  Pt's Endo made note of Vit D deficiency and recommended repeat lab work.  Pt has appt w/ [facility] upcoming.  Pt reports sxs started ~6 months ago.  No CP, SOB, HAs, abd pain, N/V.  + R sided low back/hip pain.  Pt w/ hx of sciatica.  Has never seen ortho.  Hyperlipidemia- chronic problem.  Tolerating statin w/o difficulty.  Due for labs.   Review of Systems For ROS see HPI     Objective:   Physical Exam  Constitutional: She is oriented to person, place, and time. She appears well-developed and well-nourished. No distress.  HENT:  Head: Normocephalic and atraumatic.  Eyes: Conjunctivae and EOM are normal. Pupils are equal, round, and reactive to light.  Neck: Normal range of motion. Neck supple. No thyromegaly present.  Cardiovascular: Normal rate, regular rhythm, normal heart sounds and intact distal pulses.   No murmur heard. Pulmonary/Chest: Effort normal and breath sounds normal. No respiratory distress.  Abdominal: Soft. She exhibits no distension. There is no tenderness.  Musculoskeletal: She exhibits tenderness (TTP over R SI joint and sciatic notch). She exhibits no edema.  Lymphadenopathy:    She has no cervical adenopathy.  Neurological: She is alert and oriented to person, place, and time.  (-) SLR bilaterall  Skin: Skin is warm and dry.  Psychiatric: She has a normal mood and affect. Her behavior is normal.  Vitals reviewed.         Assessment & Plan:

## 2014-10-01 NOTE — Progress Notes (Signed)
Pre visit review using our clinic review tool, if applicable. No additional management support is needed unless otherwise documented below in the visit note. 

## 2014-10-01 NOTE — Patient Instructions (Signed)
Schedule your complete physical in 6 months We'll notify you of your lab results and make any changes if needed We'll call you with your orthopedic appt to evaluate your back and hip Call with any questions or concerns Hang in there!  We'll figure this out!

## 2014-10-01 NOTE — Assessment & Plan Note (Signed)
Ongoing issue for pt.  Now w/ low back and R hip pain as well.  She has never had this evaluated by ortho and chronic pain may be contributing to her fatigue.  Refer to ortho for complete evaluation and tx.  Pt expressed understanding and is in agreement w/ plan.

## 2014-10-01 NOTE — Assessment & Plan Note (Signed)
Recurrent issue for pt.  She states 6 months of sxs.  Has seen Endo, Nephrology and me w/o obvious cause.  Pt fears some of this is 'just aging but I don't know what to expect'.  Pt has hx of vit d deficiency.  At last check, B12 was at low end of normal.  Repeat labs and replete deficiencies as needed.  Also r/o anemia or electrolyte abnormality.  Pt expressed understanding and is in agreement w/ plan.

## 2014-10-01 NOTE — Assessment & Plan Note (Signed)
Chronic problem.  Tolerating statin w/o difficulty.  Check labs.  Adjust meds prn  

## 2014-10-01 NOTE — Assessment & Plan Note (Signed)
Pt w/ hx of this (as noted by Endo).  Check labs.  Replete prn.

## 2014-10-02 LAB — CBC WITH DIFFERENTIAL/PLATELET
Basophils Absolute: 0 10*3/uL (ref 0.0–0.1)
Basophils Relative: 0.6 % (ref 0.0–3.0)
EOS PCT: 2.9 % (ref 0.0–5.0)
Eosinophils Absolute: 0.2 10*3/uL (ref 0.0–0.7)
HCT: 38.5 % (ref 36.0–46.0)
Hemoglobin: 13.1 g/dL (ref 12.0–15.0)
Lymphocytes Relative: 38.4 % (ref 12.0–46.0)
Lymphs Abs: 2.2 10*3/uL (ref 0.7–4.0)
MCHC: 34.2 g/dL (ref 30.0–36.0)
MCV: 94.1 fl (ref 78.0–100.0)
MONO ABS: 0.5 10*3/uL (ref 0.1–1.0)
Monocytes Relative: 7.9 % (ref 3.0–12.0)
NEUTROS ABS: 2.9 10*3/uL (ref 1.4–7.7)
NEUTROS PCT: 50.2 % (ref 43.0–77.0)
Platelets: 332 10*3/uL (ref 150.0–400.0)
RBC: 4.09 Mil/uL (ref 3.87–5.11)
RDW: 13.5 % (ref 11.5–15.5)
WBC: 5.8 10*3/uL (ref 4.0–10.5)

## 2014-10-03 ENCOUNTER — Telehealth: Payer: Self-pay | Admitting: Family Medicine

## 2014-10-03 ENCOUNTER — Other Ambulatory Visit: Payer: Self-pay | Admitting: General Practice

## 2014-10-03 MED ORDER — FENOFIBRATE 160 MG PO TABS: 160.0000 mg | ORAL_TABLET | Freq: Every day | ORAL | Status: DC

## 2014-10-03 MED ORDER — VITAMIN D (ERGOCALCIFEROL) 1.25 MG (50000 UNIT) PO CAPS: 50000.0000 [IU] | ORAL_CAPSULE | ORAL | Status: DC

## 2014-10-03 NOTE — Telephone Encounter (Signed)
Relation to [id number]  Call back number:[phone] Pharmacy:  Reason for call:  Patient returning your call regarding lab results

## 2014-10-03 NOTE — Telephone Encounter (Signed)
Pt informed of lab results. 

## 2014-12-07 ENCOUNTER — Ambulatory Visit: Payer: Medicare Other | Admitting: Family Medicine

## 2014-12-20 ENCOUNTER — Encounter: Payer: Self-pay | Admitting: Family Medicine

## 2014-12-20 ENCOUNTER — Ambulatory Visit (INDEPENDENT_AMBULATORY_CARE_PROVIDER_SITE_OTHER): Payer: Medicare Other | Admitting: Family Medicine

## 2014-12-20 VITALS — BP 114/74 | HR 67 | Temp 98.0°F | Resp 16 | Ht 61.0 in | Wt 148.5 lb

## 2014-12-20 DIAGNOSIS — M545 Low back pain: Secondary | ICD-10-CM

## 2014-12-20 MED ORDER — HYDROCODONE-ACETAMINOPHEN 5-325 MG PO TABS: 1.0000 | ORAL_TABLET | Freq: Four times a day (QID) | ORAL | Status: DC | PRN

## 2014-12-20 MED ORDER — CLONAZEPAM 0.5 MG PO TABS: 0.5000 mg | ORAL_TABLET | Freq: Two times a day (BID) | ORAL | Status: DC | PRN

## 2014-12-20 NOTE — Progress Notes (Signed)
   Subjective:    Patient ID: Sandra [name], female    DOB: 08-Jun-1933, 81 y.o.   MRN: [id number]  HPI Insurance changes- pt reports she needs new referrals for all of her specialist due to changing to [facility] Jan 1st.  Back pain- chronic problem, has been seeing Dr Drema Dallas at [place].  Pt had MRI and 'i heard bulging disc and spinal stenosis'.  The plan was for pt to see Dr Nelva Bush but 'i don't know this doctor to be messing around with my spine'.  Wants to know my opinion on how to proceed.   Review of Systems For ROS see HPI     Objective:   Physical Exam  Constitutional: She is oriented to person, place, and time. She appears well-developed and well-nourished. No distress.  HENT:  Head: Normocephalic and atraumatic.  Neurological: She is alert and oriented to person, place, and time.  Skin: Skin is warm and dry.  Psychiatric: Her behavior is normal.  Angry, suspicious of medical advice  Vitals reviewed.         Assessment & Plan:

## 2014-12-20 NOTE — Progress Notes (Signed)
Pre visit review using our clinic review tool, if applicable. No additional management support is needed unless otherwise documented below in the visit note. 

## 2014-12-20 NOTE — Patient Instructions (Signed)
Follow up as scheduled We'll call you with your Dr Nelva Bush appt Call with any questions or concerns If you want to join [place] at the new [place] office, any scheduled appointments will automatically transfer and we will see you at [address] [place] [address] [place], [place] [address] (OPENING 01/22/15) Happy Holidays!!!

## 2014-12-23 NOTE — Assessment & Plan Note (Addendum)
Chronic problem for pt.  Reviewed her dx w/ her and recommended that she f/u w/ Dr Nelva Bush as originally planned.  Reassured her that he is an excellent doctor and has helped many of my pts.  She is willing to proceed.  Total time spent w/ pt 15 minutes, >50% spent counseling.

## 2014-12-24 ENCOUNTER — Other Ambulatory Visit: Payer: Self-pay | Admitting: General Practice

## 2014-12-24 MED ORDER — METOPROLOL SUCCINATE ER 50 MG PO TB24: ORAL_TABLET | ORAL | Status: DC

## 2014-12-24 MED ORDER — OMEPRAZOLE 40 MG PO CPDR: 40.0000 mg | DELAYED_RELEASE_CAPSULE | Freq: Every day | ORAL | Status: DC

## 2014-12-24 MED ORDER — FENOFIBRATE 160 MG PO TABS: 160.0000 mg | ORAL_TABLET | Freq: Every day | ORAL | Status: DC

## 2015-01-03 ENCOUNTER — Other Ambulatory Visit: Payer: Self-pay | Admitting: General Practice

## 2015-01-03 MED ORDER — OMEPRAZOLE 40 MG PO CPDR
40.0000 mg | DELAYED_RELEASE_CAPSULE | Freq: Every day | ORAL | Status: DC
Start: 2015-01-03 — End: 2015-05-31

## 2015-01-03 MED ORDER — FENOFIBRATE 160 MG PO TABS: 160.0000 mg | ORAL_TABLET | Freq: Every day | ORAL | Status: DC

## 2015-01-03 MED ORDER — METOPROLOL SUCCINATE ER 50 MG PO TB24: ORAL_TABLET | ORAL | Status: DC

## 2015-01-25 DIAGNOSIS — M5136 Other intervertebral disc degeneration, lumbar region: Secondary | ICD-10-CM | POA: Diagnosis not present

## 2015-01-25 DIAGNOSIS — M5441 Lumbago with sciatica, right side: Secondary | ICD-10-CM | POA: Diagnosis not present

## 2015-01-25 DIAGNOSIS — G8929 Other chronic pain: Secondary | ICD-10-CM | POA: Diagnosis not present

## 2015-02-11 ENCOUNTER — Telehealth: Payer: Self-pay | Admitting: Family Medicine

## 2015-02-11 DIAGNOSIS — H00019 Hordeolum externum unspecified eye, unspecified eyelid: Secondary | ICD-10-CM

## 2015-02-11 NOTE — Telephone Encounter (Signed)
Caller name: Self  Can be reached: [phone]   Reason for call: Request referral Hillery Hunter for tomorrow's appt

## 2015-02-11 NOTE — Telephone Encounter (Signed)
Patient requesting referall to opthamology for brown stye on eyelid

## 2015-02-11 NOTE — Telephone Encounter (Signed)
Ok for referral to ophtho

## 2015-02-12 DIAGNOSIS — D2311 Other benign neoplasm of skin of right eyelid, including canthus: Secondary | ICD-10-CM | POA: Diagnosis not present

## 2015-02-12 DIAGNOSIS — H02825 Cysts of left lower eyelid: Secondary | ICD-10-CM | POA: Diagnosis not present

## 2015-02-12 NOTE — Telephone Encounter (Signed)
Referral placed today

## 2015-02-13 DIAGNOSIS — M5136 Other intervertebral disc degeneration, lumbar region: Secondary | ICD-10-CM | POA: Diagnosis not present

## 2015-02-22 DIAGNOSIS — D2311 Other benign neoplasm of skin of right eyelid, including canthus: Secondary | ICD-10-CM | POA: Diagnosis not present

## 2015-02-22 DIAGNOSIS — D2312 Other benign neoplasm of skin of left eyelid, including canthus: Secondary | ICD-10-CM | POA: Diagnosis not present

## 2015-02-22 DIAGNOSIS — H02825 Cysts of left lower eyelid: Secondary | ICD-10-CM | POA: Diagnosis not present

## 2015-03-04 ENCOUNTER — Encounter: Payer: Self-pay | Admitting: General Practice

## 2015-03-04 ENCOUNTER — Telehealth: Payer: Self-pay | Admitting: Family Medicine

## 2015-03-04 DIAGNOSIS — E11319 Type 2 diabetes mellitus with unspecified diabetic retinopathy without macular edema: Secondary | ICD-10-CM | POA: Insufficient documentation

## 2015-03-04 DIAGNOSIS — E785 Hyperlipidemia, unspecified: Secondary | ICD-10-CM | POA: Insufficient documentation

## 2015-03-04 DIAGNOSIS — E1169 Type 2 diabetes mellitus with other specified complication: Secondary | ICD-10-CM | POA: Insufficient documentation

## 2015-03-04 NOTE — Telephone Encounter (Signed)
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

## 2015-03-04 NOTE — Telephone Encounter (Signed)
Insurance auth # [id number], referral place under MD Dr Hermelinda Dellen, Melton Alar is [place]

## 2015-03-04 NOTE — Telephone Encounter (Signed)
Dx code?

## 2015-03-04 NOTE — Telephone Encounter (Signed)
Pending insurance auth # [id number]

## 2015-03-04 NOTE — Telephone Encounter (Signed)
°  Relation to [id number] Call back number: [phone]  Reason for call:  Patient has an appointment with Dr. Melton Alar regarding her diabetes for Monday 03/11/15 with [facility], patient insurance has changed to [facility]    Member ID # [id number]

## 2015-03-11 DIAGNOSIS — I252 Old myocardial infarction: Secondary | ICD-10-CM | POA: Diagnosis not present

## 2015-03-11 DIAGNOSIS — N189 Chronic kidney disease, unspecified: Secondary | ICD-10-CM | POA: Diagnosis not present

## 2015-03-11 DIAGNOSIS — F329 Major depressive disorder, single episode, unspecified: Secondary | ICD-10-CM | POA: Diagnosis not present

## 2015-03-11 DIAGNOSIS — Z794 Long term (current) use of insulin: Secondary | ICD-10-CM | POA: Diagnosis not present

## 2015-03-11 DIAGNOSIS — E1165 Type 2 diabetes mellitus with hyperglycemia: Secondary | ICD-10-CM | POA: Insufficient documentation

## 2015-03-11 DIAGNOSIS — E663 Overweight: Secondary | ICD-10-CM | POA: Diagnosis not present

## 2015-03-11 DIAGNOSIS — E785 Hyperlipidemia, unspecified: Secondary | ICD-10-CM | POA: Diagnosis not present

## 2015-03-11 DIAGNOSIS — N183 Chronic kidney disease, stage 3 (moderate): Secondary | ICD-10-CM | POA: Diagnosis not present

## 2015-03-11 DIAGNOSIS — I129 Hypertensive chronic kidney disease with stage 1 through stage 4 chronic kidney disease, or unspecified chronic kidney disease: Secondary | ICD-10-CM | POA: Diagnosis not present

## 2015-03-11 DIAGNOSIS — E1122 Type 2 diabetes mellitus with diabetic chronic kidney disease: Secondary | ICD-10-CM | POA: Diagnosis not present

## 2015-03-11 DIAGNOSIS — F419 Anxiety disorder, unspecified: Secondary | ICD-10-CM | POA: Diagnosis not present

## 2015-03-11 LAB — HEMOGLOBIN A1C: Hemoglobin A1C: 8.6

## 2015-03-11 LAB — HM DIABETES FOOT EXAM

## 2015-03-19 ENCOUNTER — Encounter: Payer: Self-pay | Admitting: General Practice

## 2015-03-29 ENCOUNTER — Telehealth: Payer: Self-pay

## 2015-03-29 NOTE — Telephone Encounter (Signed)
Called patient to update information. Patient refused to cooperate with updating information. States she received 3 calls from [place] today and we should have all this information in our system. Ex[plained that we do, we just need to make sure nothing has changed.

## 2015-04-01 ENCOUNTER — Encounter: Payer: Self-pay | Admitting: Family Medicine

## 2015-04-02 ENCOUNTER — Telehealth: Payer: Self-pay | Admitting: Family Medicine

## 2015-04-02 NOTE — Telephone Encounter (Signed)
No charge- my guess is she did not change her clock/watch

## 2015-04-02 NOTE — Telephone Encounter (Signed)
Pt on no show list for 04/01/15 1:30pm cpe, I believe she came in late. Appt was rescheduled at 2:23pm 3/13 for pt to come in for cpe on 04/04/15. Charge or no charge?

## 2015-04-04 ENCOUNTER — Encounter: Payer: Self-pay | Admitting: General Practice

## 2015-04-04 ENCOUNTER — Encounter: Payer: Self-pay | Admitting: Family Medicine

## 2015-04-04 ENCOUNTER — Ambulatory Visit (INDEPENDENT_AMBULATORY_CARE_PROVIDER_SITE_OTHER): Payer: Commercial Managed Care - HMO | Admitting: Family Medicine

## 2015-04-04 ENCOUNTER — Other Ambulatory Visit: Payer: Self-pay | Admitting: General Practice

## 2015-04-04 VITALS — BP 118/80 | HR 85 | Temp 98.1°F | Resp 16 | Ht 61.0 in | Wt 152.4 lb

## 2015-04-04 DIAGNOSIS — E785 Hyperlipidemia, unspecified: Secondary | ICD-10-CM | POA: Diagnosis not present

## 2015-04-04 DIAGNOSIS — Z0000 Encounter for general adult medical examination without abnormal findings: Secondary | ICD-10-CM

## 2015-04-04 DIAGNOSIS — E1169 Type 2 diabetes mellitus with other specified complication: Secondary | ICD-10-CM | POA: Diagnosis not present

## 2015-04-04 DIAGNOSIS — E11319 Type 2 diabetes mellitus with unspecified diabetic retinopathy without macular edema: Secondary | ICD-10-CM

## 2015-04-04 DIAGNOSIS — I10 Essential (primary) hypertension: Secondary | ICD-10-CM

## 2015-04-04 DIAGNOSIS — Z0001 Encounter for general adult medical examination with abnormal findings: Secondary | ICD-10-CM | POA: Diagnosis not present

## 2015-04-04 DIAGNOSIS — F411 Generalized anxiety disorder: Secondary | ICD-10-CM | POA: Diagnosis not present

## 2015-04-04 DIAGNOSIS — M810 Age-related osteoporosis without current pathological fracture: Secondary | ICD-10-CM

## 2015-04-04 DIAGNOSIS — R0602 Shortness of breath: Secondary | ICD-10-CM

## 2015-04-04 DIAGNOSIS — Z794 Long term (current) use of insulin: Secondary | ICD-10-CM

## 2015-04-04 LAB — BASIC METABOLIC PANEL
BUN: 23 mg/dL (ref 6–23)
CALCIUM: 9.4 mg/dL (ref 8.4–10.5)
CHLORIDE: 101 meq/L (ref 96–112)
CO2: 29 mEq/L (ref 19–32)
CREATININE: 1.4 mg/dL — AB (ref 0.40–1.20)
GFR: 38.53 mL/min — ABNORMAL LOW (ref >60.00)
Glucose, Bld: 247 mg/dL — ABNORMAL HIGH (ref 70–99)
Potassium: 4.3 mEq/L (ref 3.5–5.1)
SODIUM: 137 meq/L (ref 135–145)

## 2015-04-04 LAB — HEPATIC FUNCTION PANEL
ALK PHOS: 50 U/L (ref 39–117)
ALT: 19 U/L (ref 0–35)
AST: 21 U/L (ref 0–37)
Albumin: 4 g/dL (ref 3.5–5.2)
BILIRUBIN DIRECT: 0.1 mg/dL (ref 0.0–0.3)
Total Bilirubin: 0.4 mg/dL (ref 0.2–1.2)
Total Protein: 7.2 g/dL (ref 6.0–8.3)

## 2015-04-04 LAB — CBC WITH DIFFERENTIAL/PLATELET
BASOS PCT: 0.4 % (ref 0.0–3.0)
Basophils Absolute: 0 10*3/uL (ref 0.0–0.1)
Eosinophils Absolute: 0.2 10*3/uL (ref 0.0–0.7)
Eosinophils Relative: 2.7 % (ref 0.0–5.0)
HEMATOCRIT: 36.6 % (ref 36.0–46.0)
Hemoglobin: 12.6 g/dL (ref 12.0–15.0)
LYMPHS PCT: 32.1 % (ref 12.0–46.0)
Lymphs Abs: 2.2 10*3/uL (ref 0.7–4.0)
MCHC: 34.4 g/dL (ref 30.0–36.0)
MCV: 94.6 fl (ref 78.0–100.0)
MONOS PCT: 9.8 % (ref 3.0–12.0)
Monocytes Absolute: 0.7 10*3/uL (ref 0.1–1.0)
NEUTROS ABS: 3.7 10*3/uL (ref 1.4–7.7)
Neutrophils Relative %: 55 % (ref 43.0–77.0)
PLATELETS: 314 10*3/uL (ref 150.0–400.0)
RBC: 3.87 Mil/uL (ref 3.87–5.11)
RDW: 12.7 % (ref 11.5–15.5)
WBC: 6.8 10*3/uL (ref 4.0–10.5)

## 2015-04-04 LAB — TSH: TSH: 1.64 u[IU]/mL (ref 0.35–4.50)

## 2015-04-04 LAB — LDL CHOLESTEROL, DIRECT: LDL DIRECT: 130 mg/dL

## 2015-04-04 LAB — BRAIN NATRIURETIC PEPTIDE: Pro B Natriuretic peptide (BNP): 51 pg/mL (ref 0.0–100.0)

## 2015-04-04 LAB — LIPID PANEL
Cholesterol: 198 mg/dL (ref 0–200)
HDL: 27.5 mg/dL — ABNORMAL LOW (ref >39.00)
NONHDL: 170.16
TRIGLYCERIDES: 334 mg/dL — AB (ref 0.0–149.0)
Total CHOL/HDL Ratio: 7
VLDL: 66.8 mg/dL — ABNORMAL HIGH (ref 0.0–40.0)

## 2015-04-04 LAB — VITAMIN D 25 HYDROXY (VIT D DEFICIENCY, FRACTURES): VITD: 29.31 ng/mL — ABNORMAL LOW (ref 30.00–100.00)

## 2015-04-04 MED ORDER — ATORVASTATIN CALCIUM 20 MG PO TABS: 20.0000 mg | ORAL_TABLET | Freq: Every day | ORAL | Status: DC

## 2015-04-04 MED ORDER — HYDROCODONE-ACETAMINOPHEN 5-325 MG PO TABS: 1.0000 | ORAL_TABLET | Freq: Four times a day (QID) | ORAL | Status: DC | PRN

## 2015-04-04 MED ORDER — CLONAZEPAM 0.5 MG PO TABS: 0.5000 mg | ORAL_TABLET | Freq: Two times a day (BID) | ORAL | Status: DC | PRN

## 2015-04-04 NOTE — Patient Instructions (Signed)
Follow up in 6 months to recheck BP and cholesterol We'll notify you of your lab results and make any changes if needed We will be happy to do any labs that you need Please call and schedule your mammogram We'll call you with your Pulmonary appt for the shortness of breath You are up to date on colonoscopy and will not need another- yay! You are up to date on immunizations Keep up the good work on healthy diet and activity as able Call with any questions or concerns If you want to join [place] at the new [place] office, any scheduled appointments will automatically transfer and we will see you at [address] [place] [address] [place], [place] [address] ([place] 3/23) [place] in there!

## 2015-04-04 NOTE — Progress Notes (Signed)
   Subjective:    Patient ID: Sandra [name], female    DOB: 1933-09-07, 82 y.o.   MRN: [id number]  HPI Here today for CPE.  Risk Factors: DM- chronic problem.  Following w/ Melton Alar in [place] HTN- chronic problem, on Lisinopril, Metoprolol daily w/ good control Hyperlipidemia- chronic problem, on Lipitor and fenofibrate daily Osteoporosis- due for repeat DEXA.  On Ca and Vit D daily Physical Activity: limited activity, no formal exercise Fall Risk: low Depression: denies depression, ongoing anxiety but sxs are controlled w/ PRN clonazepam Hearing: normal to conversational tones, mildly decreased to whispered voice ADL's: independent Cognitive: normal linear  Home Safety: safe at home, lives  Height, Weight, BMI, Visual Acuity: see vitals, vision corrected to 20/20 w/ glasses Counseling: UTD on colonoscopy (due 2019 w/ Dr Fuller Plan), following w/ Melton Alar for diabetes (UTD on foot and eye exam).  Due for mammo- pt plans to call and schedule at [facility] team reviewed and updated w/ pt Labs Ordered: See A&P Care Plan: See A&P    Review of Systems Patient reports no vision/ hearing changes, adenopathy,fever, weight change,  persistant/recurrent hoarseness , swallowing issues, chest pain, palpitations, edema, persistant/recurrent cough, hemoptysis, gastrointestinal bleeding (melena, rectal bleeding), abdominal pain, significant heartburn, bowel changes, GU symptoms (dysuria, hematuria, incontinence), Gyn symptoms (abnormal  bleeding, pain),  syncope, focal weakness, memory loss, numbness & tingling, skin/hair/nail changes, abnormal bruising or bleeding, anxiety, or depression.   + SOB- pt is concerned and family has noted her symptoms.  Pt has very little stamina/endurance.  Intermittent wheezing.  Minimal cough.  No SOB lying flat.    Objective:   Physical Exam General Appearance:    Alert, cooperative, no distress, appears stated age  Head:    Normocephalic, without obvious  abnormality, atraumatic  Eyes:    PERRL, conjunctiva/corneas clear, EOM's intact, fundi    benign, both eyes  Ears:    Normal TM's and external ear canals, both ears  Nose:   Nares normal, septum midline, mucosa normal, no drainage    or sinus tenderness  Throat:   Lips, mucosa, and tongue normal; teeth and gums normal  Neck:   Supple, symmetrical, trachea midline, no adenopathy;    Thyroid: no enlargement/tenderness/nodules  Back:     Symmetric, no curvature, ROM normal, no CVA tenderness  Lungs:     Clear to auscultation bilaterally, respirations labored  Chest Wall:    No tenderness or deformity   Heart:    Regular rate and rhythm, S1 and S2 normal, no murmur, rub   or gallop  Breast Exam:    Deferred to mammo  Abdomen:     Soft, non-tender, bowel sounds active all four quadrants,    no masses, no organomegaly  Genitalia:    Deferred  Rectal:    Extremities:   Extremities normal, atraumatic, no cyanosis or edema  Pulses:   2+ and symmetric all extremities  Skin:   Skin color, texture, turgor normal, no rashes or lesions  Lymph nodes:   Cervical, supraclavicular, and axillary nodes normal  Neurologic:   CNII-XII intact, normal strength, sensation and reflexes    throughout          Assessment & Plan:

## 2015-04-08 ENCOUNTER — Other Ambulatory Visit: Payer: Self-pay | Admitting: General Practice

## 2015-04-08 ENCOUNTER — Encounter: Payer: Self-pay | Admitting: General Practice

## 2015-04-08 MED ORDER — VITAMIN D (ERGOCALCIFEROL) 1.25 MG (50000 UNIT) PO CAPS: 50000.0000 [IU] | ORAL_CAPSULE | ORAL | Status: DC

## 2015-04-24 ENCOUNTER — Ambulatory Visit (INDEPENDENT_AMBULATORY_CARE_PROVIDER_SITE_OTHER)
Admission: RE | Admit: 2015-04-24 | Discharge: 2015-04-24 | Disposition: A | Discharge status: Home / Self Care | Payer: Commercial Managed Care - HMO | Source: Ambulatory Visit | Attending: Pulmonary Disease | Admitting: Pulmonary Disease

## 2015-04-24 ENCOUNTER — Encounter: Payer: Self-pay | Admitting: Pulmonary Disease

## 2015-04-24 ENCOUNTER — Telehealth: Payer: Self-pay | Admitting: Pulmonary Disease

## 2015-04-24 ENCOUNTER — Ambulatory Visit (INDEPENDENT_AMBULATORY_CARE_PROVIDER_SITE_OTHER): Payer: Commercial Managed Care - HMO | Admitting: Pulmonary Disease

## 2015-04-24 VITALS — BP 108/79 | HR 77 | Temp 98.3°F | Ht 61.0 in | Wt 151.6 lb

## 2015-04-24 DIAGNOSIS — R0600 Dyspnea, unspecified: Secondary | ICD-10-CM

## 2015-04-24 DIAGNOSIS — K219 Gastro-esophageal reflux disease without esophagitis: Secondary | ICD-10-CM

## 2015-04-24 MED ORDER — RANITIDINE HCL 150 MG PO TABS: 150.0000 mg | ORAL_TABLET | Freq: Every day | ORAL | Status: DC

## 2015-04-24 NOTE — Patient Instructions (Signed)
   I'm starting you on Zantac (generic ranitidine) 150 mg at night before bed to help with your acid reflux. You do not need to take your Omeprazole (Prilosec) with this medication. Please call my office if you're still having acid reflux on this medication after 1-2 weeks. Try to avoid eating or drinking anything within 2 hours of bedtime.  We will do a breathing and walking test at your next appointment.  Please call me if you have any new breathing problems before your next appointment in 1-2 months.  ORDERS: 1. Full pulmonary function testing at follow-up appointment 2. 6 minute walk test on room air at next appointment 3. Chest x-ray PA/LAT today

## 2015-04-24 NOTE — Progress Notes (Signed)
Subjective:    Patient ID: Sandra [name], female    DOB: November 11, 1933, 82 y.o.   MRN: [id number]  HPI She reports that her family noticed that she was having dyspnea on exertion starting 6-7 months ago. She admits to dyspnea on exertion even short distances. She reports she has trouble getting a deep breath. She has had associated audible wheezing. She does have occasional coughing. Cough is nonproductive. She does wake up coughing at night sometimes. She reports she has gained about 10-11 pounds over the last year. She denies any change in her eating habits. She does admit she is less mobile. She reports some back pain from herniated discs has limited her capabilities as well. She denies any breathing problems as a child or young adult. Denies any history of bronchitis. She has had frequent sinus infections. She denies any seasonal allergies in the past but has had problems sneezing more frequently lately. Denies any breathing problems, wheezing, or coughing with exposure to odors, perfumes, or smells. She reports she does have chest tightness with her dyspnea but no chest pain. No edema. Does have reflux and morning brash water taste as well. She reports she only takes Prilosec as needed, not every day.   Review of Systems No fever or chills. Does have chronic sweats. No dysuria or hematuria. A pertinent 14 point review of systems is negative except as per the history of presenting illness.  Allergies  Allergen Reactions  . Cephalexin Nausea And Vomiting    Pt stated made severely sick, will never take again  . Pioglitazone     REACTION: EDEMA    Current Outpatient Prescriptions on File Prior to Visit  Medication Sig Dispense Refill  . aspirin 81 MG tablet Take 81 mg by mouth daily.    Marland Kitchen atorvastatin (LIPITOR) 20 MG tablet Take 1 tablet (20 mg total) by mouth daily. (Patient taking differently: Take 10 mg by mouth daily. ) 90 tablet 1  . Calcium-Magnesium-Vitamin D (CALCIUM 1200+D3 PO) Take 1  tablet by mouth daily. Vitamin d is 1000iu    . clonazePAM (KLONOPIN) 0.5 MG tablet Take 1 tablet (0.5 mg total) by mouth 2 (two) times daily as needed for anxiety. 30 tablet 3  . fenofibrate 160 MG tablet Take 1 tablet (160 mg total) by mouth daily. 90 tablet 1  . glimepiride (AMARYL) 4 MG tablet Take 2 mg by mouth 2 (two) times daily.   2  . HYDROcodone-acetaminophen (NORCO/VICODIN) 5-325 MG tablet Take 1 tablet by mouth every 6 (six) hours as needed for moderate pain. 45 tablet 0  . insulin aspart (NOVOLOG FLEXPEN) 100 UNIT/ML FlexPen Inject 15 Units into the skin 3 (three) times daily with meals.    . Insulin Glargine (TOUJEO SOLOSTAR) 300 UNIT/ML SOPN Inject 60 Units into the skin.     . Insulin Syringe-Needle U-100 (INSULIN SYRINGE .5CC/31GX5/16") 31G X 5/16" 0.5 ML MISC Use to inject insulin daily    . lisinopril (PRINIVIL,ZESTRIL) 10 MG tablet Take 10 mg by mouth daily.     . metoprolol succinate (TOPROL-XL) 50 MG 24 hr tablet TAKE 1 TABLET BY MOUTH DAILY. TAKE WITH OR IMMEDIATELY FOLLOWING A MEAL 90 tablet 1  . omeprazole (PRILOSEC) 40 MG capsule Take 1 capsule (40 mg total) by mouth daily. (Patient taking differently: Take 40 mg by mouth daily as needed. ) 90 capsule 1  . ondansetron (ZOFRAN ODT) 4 MG disintegrating tablet Take 1 tablet (4 mg total) by mouth every 8 (eight) hours as needed  for nausea or vomiting. 30 tablet 0  . Vitamin D, Ergocalciferol, (DRISDOL) 50000 units CAPS capsule Take 1 capsule (50,000 Units total) by mouth every 7 (seven) days. 12 capsule 0   No current facility-administered medications on file prior to visit.    Past Medical History  Diagnosis Date  . Depression   . GERD (gastroesophageal reflux disease)   . Hyperlipidemia   . Diabetes mellitus     type 2  . Hypertension   . Osteopenia   . Allergic rhinitis   . Takotsubo cardiomyopathy     with subsequent normalization of left ventricular systolic function  . Hyperlipidemia   . Anxiety   . Acute MI  ([place])   . Valvular heart disease   . Acid reflux   . Degenerative joint disease   . Neck pain, chronic   . Sciatica   . Gait disturbance   . SAH (subarachnoid hemorrhage) ([place])   . TIA (transient ischemic attack)     Past Surgical History  Procedure Laterality Date  . Abdominal hysterectomy  1977    no oophorectomy  . Spinal fusion  2000    Dr. Vertell Limber  . Cataract extraction, bilateral  2011  . Cardiac catheterization    . Appendectomy    . Tonsillectomy      Family History  Problem Relation Age of Onset  . Diabetes Sister   . Asthma Sister   . Diabetes Sister   . Breast cancer      ?Aunt  . Cancer      breast?  . Intracerebral hemorrhage Daughter     assoc with post-partum  . Coronary artery disease Neg Hx     Social History   Social History  . Marital Status: Divorced    Spouse Name: N/A  . Number of Children: 2  . Years of Education: N/A   Occupational History  . Retired     Press photographer   Social History Main Topics  . Smoking status: Passive Smoke Exposure - Never Smoker  . Smokeless tobacco: Never Used     Comment: Mother & Children  . Alcohol Use: No  . Drug Use: No  . Sexual Activity: No   Other Topics Concern  . None   Social History Narrative   Lives with her daughter and G-daughter   Diet- working on portion control   Exercise- no routine exercise but active      [facility]:   Originally from [place]. Always lived in [place]. Previously has traveled to [place], [place], [place], [place], & [place]. Previously did accounting and clerical work. Has a dog currently. Remote exposure to a parakeet. No mold exposure.       Objective:   Physical Exam BP 108/79 mmHg  Pulse 77  Temp(Src) 98.3 F (36.8 C) (Oral)  Ht 5\' 1"  (1.549 m)  Wt 151 lb 9.6 oz (68.765 kg)  BMI 28.66 kg/m2  SpO2 95% General:  Awake. Alert. No acute distress. Mild central obesity.  Integument:  Warm & dry. No rash on exposed skin. No bruising. Lymphatics:  No appreciated cervical or  supraclavicular lymphadenoapthy. HEENT:  Moist mucus membranes. No oral ulcers. No scleral injection or icterus. Minimal nasal turbinate swelling Cardiovascular:  Regular rate. No edema. No appreciable JVD.  Pulmonary:  Good aeration & clear to auscultation bilaterally. Symmetric chest wall expansion. No accessory muscle use. Abdomen: Soft. Normal bowel sounds. Protuberant. Grossly nontender. Musculoskeletal:  Normal bulk and tone. Hand grip strength 5/5 bilaterally. No joint  deformity or effusion appreciated. Mild kyphosis. Neurological:  CN 2-12 grossly in tact. No meningismus. Moving all 4 extremities equally. Symmetric brachioradialis deep tendon reflexes. Psychiatric:  Mood and affect congruent. Speech normal rhythm, rate & tone. Somewhat poor eye contact.  CARDIAC TTE (11/28/10): LV normal in size with EF 60%. Cannot exclude regional wall motion abnormalities. LA normal in size. RA upper limits of normal in size. RV normal in size and function.o aortic stenosis or regurgitation. No mitral stenosis or regurgitation.No pulmonic regurgitation. No pericardial effusion.  LABS 04/04/15 CBC: 6.8/12.6/36.6/314 BMP: 137/4.3/101/29/23/1.4/247/9.4 LFT: 4.0/7.2/0.4/50/21/19 PRO-BNP: 51    Assessment & Plan:  82 year old female with ongoing dyspnea, cough, and wheezing over the last few months. Patient has no evidence of anemia on recent CBC. Additionally when reviewing her transthoracic echocardiogram it generally appears normal and she has no signs of volume overload today. Certainly her uncontrolled reflux could be contributing to her cough and wheezing but I would not expect a concomitant dyspnea. With her previous exposure to tobacco use secondhand COPD or even asthma is possible. I instructed the patient to contact my office if she had any questions or new concerns before her next appointment.  1. Dyspnea: Checking 6 minute walk test on room air as well as full pulmonary function testing at  follow-up appointment. Checking chest x-ray PA/LAT today. 2. GERD: Patient counseled on avoiding eating within 2 hours of bedtime. Starting on empiric Zantac 150 mg by mouth daily at bedtime. 3. Follow-up: Patient to return to clinic 1-2 months or sooner if needed.  Sonia Baller Ashok Cordia, M.D. [facility] Pager:  [phone] After 3pm or if no response, call [phone] 4:00 PM 04/24/2015

## 2015-04-24 NOTE — Addendum Note (Signed)
Addended by: Raymondo Band D on: 04/24/2015 04:08 PM   Modules accepted: Orders

## 2015-04-24 NOTE — Addendum Note (Signed)
Addended by: Raymondo Band D on: 04/24/2015 04:05 PM   Modules accepted: Orders

## 2015-04-24 NOTE — Telephone Encounter (Signed)
CARDIAC TTE (11/28/10):  LV normal in size with EF 60%.cannot exclude regional wall motion abnormalities. LA normal i size. RA upper limits of normal in size. RV normal in size and function.o aortic stenosis or regurgitation. No mitral stenosis or regurgitation.No pulmonic regurgitation. No pericardial effusion.  LABS 04/04/15 CBC: 6.8/12.6/36.6/314 BMP: 137/4.3/101/29/23/1.4/247/9.4 LFT: 4.0/7.2/0.4/50/21/19 PRO-BNP: 51

## 2015-04-26 ENCOUNTER — Other Ambulatory Visit: Payer: Self-pay | Admitting: Pulmonary Disease

## 2015-04-26 DIAGNOSIS — R0602 Shortness of breath: Secondary | ICD-10-CM

## 2015-04-26 NOTE — Progress Notes (Signed)
Quick Note:  Called, spoke with pt. Discussed cxr results and recs per Dr. Ashok Cordia. Pt verbalized understanding and would like to proceed with ct. Order placed. Pt aware PCCs will call her with appt date, time, and location. She verbalized understanding, is in agreement with plan, and voiced no further questions or concerns at this time. ______

## 2015-04-27 NOTE — Assessment & Plan Note (Signed)
Chronic problem.  Currently controlled w/ Clonazepam prn.

## 2015-04-27 NOTE — Assessment & Plan Note (Signed)
Chronic problem.  Check Vit D- replete prn.

## 2015-04-27 NOTE — Assessment & Plan Note (Signed)
Chronic problem.  Currently well controlled.  Asymptomatic w/ exception of SOB.  Check labs.  No anticipated med changes.  Will follow.

## 2015-04-27 NOTE — Assessment & Plan Note (Signed)
Chronic problem.  Tolerating statin w/o difficulty.  Check labs.  Adjust meds prn  

## 2015-04-27 NOTE — Assessment & Plan Note (Signed)
New.  Pt's sxs are worrisome to her and her family has also noticed.  EKG done today- no obvious cause.  Check BNP and refer to pulmonary for complete evaluation.  Pt expressed understanding and is in agreement w/ plan.

## 2015-04-27 NOTE — Assessment & Plan Note (Signed)
Pt's PE WNL for age.  Due for mammo- pt plans to schedule.  UTD on colonoscopy.  UTD on immunizations.  Written screening schedule updated and given to pt.  Check labs.  Anticipatory guidance provided.

## 2015-04-27 NOTE — Assessment & Plan Note (Signed)
Chronic problem.  Following w/ Melton Alar w/ [facility].  UTD on eye and foot exam.  On ACE for renal protection.  Will follow along.

## 2015-05-02 ENCOUNTER — Ambulatory Visit (INDEPENDENT_AMBULATORY_CARE_PROVIDER_SITE_OTHER)
Admission: RE | Admit: 2015-05-02 | Discharge: 2015-05-02 | Disposition: A | Discharge status: Home / Self Care | Payer: Commercial Managed Care - HMO | Source: Ambulatory Visit | Attending: Pulmonary Disease | Admitting: Pulmonary Disease

## 2015-05-02 DIAGNOSIS — R0602 Shortness of breath: Secondary | ICD-10-CM

## 2015-05-02 DIAGNOSIS — J849 Interstitial pulmonary disease, unspecified: Secondary | ICD-10-CM | POA: Diagnosis not present

## 2015-05-08 ENCOUNTER — Telehealth: Payer: Self-pay | Admitting: Pulmonary Disease

## 2015-05-08 DIAGNOSIS — R9389 Abnormal findings on diagnostic imaging of other specified body structures: Secondary | ICD-10-CM

## 2015-05-08 NOTE — Telephone Encounter (Signed)
Labs entered per JN.

## 2015-05-08 NOTE — Telephone Encounter (Signed)
Pt is aware of CT results. See result note. Nothing further was needed.

## 2015-05-10 ENCOUNTER — Other Ambulatory Visit (INDEPENDENT_AMBULATORY_CARE_PROVIDER_SITE_OTHER): Payer: Commercial Managed Care - HMO

## 2015-05-10 DIAGNOSIS — R938 Abnormal findings on diagnostic imaging of other specified body structures: Secondary | ICD-10-CM

## 2015-05-10 DIAGNOSIS — R9389 Abnormal findings on diagnostic imaging of other specified body structures: Secondary | ICD-10-CM

## 2015-05-10 LAB — C-REACTIVE PROTEIN: CRP: 0.6 mg/dL (ref 0.5–20.0)

## 2015-05-10 LAB — SEDIMENTATION RATE: Sed Rate: 25 mm/hr — ABNORMAL HIGH (ref 0–22)

## 2015-05-11 LAB — RHEUMATOID FACTOR: Rhuematoid fact SerPl-aCnc: <10 IU/mL (ref <=14)

## 2015-05-13 LAB — SJOGREN'S SYNDROME ANTIBODS(SSA + SSB)
SSA (Ro) (ENA) Antibody, IgG: 1.2 — ABNORMAL HIGH
SSB (LA) (ENA) ANTIBODY, IGG: NEGATIVE

## 2015-05-13 LAB — CYCLIC CITRUL PEPTIDE ANTIBODY, IGG: Cyclic Citrullin Peptide Ab: <16 Units

## 2015-05-13 LAB — ANTI-SCLERODERMA ANTIBODY: Scleroderma (Scl-70) (ENA) Antibody, IgG: 3.1 — ABNORMAL HIGH

## 2015-05-13 LAB — ANTI-SMITH ANTIBODY: ENA SM AB SER-ACNC: NEGATIVE

## 2015-05-13 LAB — ANTI-DNA ANTIBODY, DOUBLE-STRANDED

## 2015-05-15 LAB — ANA COMPREHENSIVE PANEL
Anti JO-1: <0.2 AI (ref 0.0–0.9)
Centromere Ab Screen: <0.2 AI (ref 0.0–0.9)
ENA SSA (RO) Ab: 1.5 AI — ABNORMAL HIGH (ref 0.0–0.9)
ENA SSB (LA) Ab: <0.2 AI (ref 0.0–0.9)
SCL 70: 4.1 AI — AB (ref 0.0–0.9)

## 2015-05-15 LAB — HYPERSENSITIVITY PNEUMONITIS
A. PULLULANS ABS: NEGATIVE
A.Fumigatus #1 Abs: NEGATIVE
MICROPOLYSPORA FAENI IGG: NEGATIVE
Pigeon Serum Abs: NEGATIVE
THERMOACT. SACCHARII: NEGATIVE
THERMOACTINOMYCES VULGARIS IGG: NEGATIVE

## 2015-05-20 ENCOUNTER — Telehealth: Payer: Self-pay | Admitting: Pulmonary Disease

## 2015-05-20 DIAGNOSIS — M349 Systemic sclerosis, unspecified: Secondary | ICD-10-CM

## 2015-05-20 NOTE — Telephone Encounter (Signed)
Notes Recorded by Javier Glazier, MD on 05/15/2015 at 6:16 PM Please call the patient and let her know that her blood work does suggest the possibility of underlying Scleroderma as a cause for the scar tissue in her lungs. Please refer the patient to Rheumatology for further evaluation and potential workup. Also, please order a HRCT of the chest w/o contrast to better evaluate her ILD pattern. Thanks. ------------------  atc pt, line rang 3 times then went to fast busy signal.  Wcb.

## 2015-05-21 NOTE — Telephone Encounter (Signed)
Spoke with pt, aware of results/recs.  Orders placed.  Nothing further needed.

## 2015-05-22 ENCOUNTER — Telehealth: Payer: Self-pay | Admitting: General Practice

## 2015-05-22 DIAGNOSIS — M349 Systemic sclerosis, unspecified: Secondary | ICD-10-CM

## 2015-05-22 NOTE — Telephone Encounter (Signed)
Sherry from Dr. Ammie Dalton office called in regards to patient. They had placed a referral to [facility] Rheumatology (due to [facility]) this has to come from PCP. Ok to place a referral to Rheum for scleroderma? Pt was last seen 04/04/15 for wellness exam

## 2015-05-22 NOTE — Telephone Encounter (Signed)
Ok for referral?

## 2015-05-22 NOTE — Telephone Encounter (Signed)
Referral placed.

## 2015-05-23 ENCOUNTER — Telehealth: Payer: Self-pay

## 2015-05-23 ENCOUNTER — Telehealth: Payer: Self-pay | Admitting: Family Medicine

## 2015-05-23 ENCOUNTER — Other Ambulatory Visit (INDEPENDENT_AMBULATORY_CARE_PROVIDER_SITE_OTHER): Payer: Commercial Managed Care - HMO

## 2015-05-23 ENCOUNTER — Other Ambulatory Visit: Payer: Self-pay

## 2015-05-23 ENCOUNTER — Ambulatory Visit (INDEPENDENT_AMBULATORY_CARE_PROVIDER_SITE_OTHER)
Admission: RE | Admit: 2015-05-23 | Discharge: 2015-05-23 | Disposition: A | Discharge status: Home / Self Care | Payer: Commercial Managed Care - HMO | Source: Ambulatory Visit | Attending: Pulmonary Disease | Admitting: Pulmonary Disease

## 2015-05-23 DIAGNOSIS — N183 Chronic kidney disease, stage 3 unspecified: Secondary | ICD-10-CM

## 2015-05-23 DIAGNOSIS — N2581 Secondary hyperparathyroidism of renal origin: Secondary | ICD-10-CM | POA: Diagnosis not present

## 2015-05-23 DIAGNOSIS — D631 Anemia in chronic kidney disease: Secondary | ICD-10-CM | POA: Diagnosis not present

## 2015-05-23 DIAGNOSIS — E211 Secondary hyperparathyroidism, not elsewhere classified: Secondary | ICD-10-CM

## 2015-05-23 DIAGNOSIS — N189 Chronic kidney disease, unspecified: Secondary | ICD-10-CM

## 2015-05-23 DIAGNOSIS — M349 Systemic sclerosis, unspecified: Secondary | ICD-10-CM | POA: Diagnosis not present

## 2015-05-23 DIAGNOSIS — R0602 Shortness of breath: Secondary | ICD-10-CM | POA: Diagnosis not present

## 2015-05-23 LAB — CBC WITH DIFFERENTIAL/PLATELET
Basophils Absolute: 0 cells/uL (ref 0–200)
Basophils Relative: 0 %
EOS PCT: 1 %
Eosinophils Absolute: 53 cells/uL (ref 15–500)
HEMATOCRIT: 37.4 % (ref 35.0–45.0)
HEMOGLOBIN: 12.3 g/dL (ref 11.7–15.5)
LYMPHS ABS: 1855 {cells}/uL (ref 850–3900)
Lymphocytes Relative: 35 %
MCH: 31.9 pg (ref 27.0–33.0)
MCHC: 32.9 g/dL (ref 32.0–36.0)
MCV: 96.9 fL (ref 80.0–100.0)
MONOS PCT: 6 %
MPV: 10.3 fL (ref 7.5–12.5)
Monocytes Absolute: 318 cells/uL (ref 200–950)
NEUTROS ABS: 3074 {cells}/uL (ref 1500–7800)
Neutrophils Relative %: 58 %
Platelets: 331 10*3/uL (ref 140–400)
RBC: 3.86 MIL/uL (ref 3.80–5.10)
RDW: 12.5 % (ref 11.0–15.0)
WBC: 5.3 10*3/uL (ref 3.8–10.8)

## 2015-05-23 LAB — IRON AND TIBC
%SAT: 28 % (ref 11–50)
IRON: 108 ug/dL (ref 45–160)
TIBC: 380 ug/dL (ref 250–450)
UIBC: 272 ug/dL (ref 125–400)

## 2015-05-23 LAB — PHOSPHORUS: Phosphorus: 3 mg/dL (ref 2.1–4.3)

## 2015-05-23 NOTE — Telephone Encounter (Signed)
Patient called to schedule lab work for the Kidney doctor, she also states she now has [facility] and needs a referral placed for her nephrologist, Dr Donato Heinz, her appt is next week, at [address], [place], [place] phone is [phone] fax [phone]

## 2015-05-23 NOTE — Telephone Encounter (Signed)
Ok for referral?

## 2015-05-23 NOTE — Telephone Encounter (Signed)
Referral placed.

## 2015-05-23 NOTE — Telephone Encounter (Signed)
Labs received and orders placed. Will fax the results to [facility] when received.

## 2015-05-23 NOTE — Telephone Encounter (Signed)
Pt calling asking about lab orders were receive from [facility].

## 2015-05-24 LAB — URINALYSIS, ROUTINE W REFLEX MICROSCOPIC
BILIRUBIN URINE: NEGATIVE
Hgb urine dipstick: NEGATIVE
KETONES UR: NEGATIVE
Leukocytes, UA: NEGATIVE
Nitrite: NEGATIVE
PH: 5.5 (ref 5.0–8.0)
Protein, ur: NEGATIVE
SPECIFIC GRAVITY, URINE: 1.025 (ref 1.001–1.035)

## 2015-05-24 LAB — PTH, INTACT AND CALCIUM
Calcium: 9.1 mg/dL (ref 8.4–10.5)
PTH: 36 pg/mL (ref 14–64)

## 2015-05-24 LAB — URINALYSIS, MICROSCOPIC ONLY
Bacteria, UA: NONE SEEN [HPF]
CASTS: NONE SEEN [LPF]
Crystals: NONE SEEN [HPF]
RBC / HPF: NONE SEEN RBC/HPF (ref <=2)
YEAST: NONE SEEN [HPF]

## 2015-05-24 LAB — FERRITIN: FERRITIN: 201 ng/mL (ref 20–288)

## 2015-05-28 LAB — COMPREHENSIVE METABOLIC PANEL
ALBUMIN: 4 g/dL (ref 3.6–5.1)
ALT: 16 U/L (ref 6–29)
AST: 22 U/L (ref 10–35)
Alkaline Phosphatase: 52 U/L (ref 33–130)
BILIRUBIN TOTAL: 0.3 mg/dL (ref 0.2–1.2)
BUN: 32 mg/dL — ABNORMAL HIGH (ref 7–25)
CHLORIDE: 108 mmol/L (ref 98–110)
CO2: 20 mmol/L (ref 20–31)
CREATININE: 1.42 mg/dL — AB (ref 0.60–0.93)
Calcium: 9 mg/dL (ref 8.6–10.4)
Glucose, Bld: 302 mg/dL — ABNORMAL HIGH (ref 65–99)
Potassium: 4.5 mmol/L (ref 3.5–5.3)
SODIUM: 140 mmol/L (ref 135–146)
TOTAL PROTEIN: 6.7 g/dL (ref 6.1–8.1)

## 2015-05-29 DIAGNOSIS — E785 Hyperlipidemia, unspecified: Secondary | ICD-10-CM | POA: Diagnosis not present

## 2015-05-29 DIAGNOSIS — N2581 Secondary hyperparathyroidism of renal origin: Secondary | ICD-10-CM | POA: Diagnosis not present

## 2015-05-29 DIAGNOSIS — I5181 Takotsubo syndrome: Secondary | ICD-10-CM | POA: Diagnosis not present

## 2015-05-29 DIAGNOSIS — N183 Chronic kidney disease, stage 3 (moderate): Secondary | ICD-10-CM | POA: Diagnosis not present

## 2015-05-29 DIAGNOSIS — M358 Other specified systemic involvement of connective tissue: Secondary | ICD-10-CM | POA: Diagnosis not present

## 2015-05-29 DIAGNOSIS — E1122 Type 2 diabetes mellitus with diabetic chronic kidney disease: Secondary | ICD-10-CM | POA: Diagnosis not present

## 2015-05-29 DIAGNOSIS — D631 Anemia in chronic kidney disease: Secondary | ICD-10-CM | POA: Diagnosis not present

## 2015-05-29 DIAGNOSIS — I10 Essential (primary) hypertension: Secondary | ICD-10-CM | POA: Diagnosis not present

## 2015-05-29 DIAGNOSIS — J8489 Other specified interstitial pulmonary diseases: Secondary | ICD-10-CM | POA: Diagnosis not present

## 2015-05-30 ENCOUNTER — Telehealth: Payer: Self-pay | Admitting: Pulmonary Disease

## 2015-05-30 NOTE — Telephone Encounter (Signed)
Pt requesting OV with JN 05/31/15 to discuss all results of recent tests- pt is not wanting to go to Rheum appt without talking to [facility] personally regarding these first. Appt scheduled for 05/31/15 at 10am. With Dr Ashok Cordia. Pt would like to see her cxr and CT scans and have them explained thoroughly and also wants to discuss cancelling her June 9 appt for follow up here after her Rheum appt. Will send to Dr Ashok Cordia as Juluis Rainier.

## 2015-05-31 ENCOUNTER — Ambulatory Visit (INDEPENDENT_AMBULATORY_CARE_PROVIDER_SITE_OTHER): Payer: Commercial Managed Care - HMO | Admitting: Pulmonary Disease

## 2015-05-31 ENCOUNTER — Encounter: Payer: Self-pay | Admitting: Pulmonary Disease

## 2015-05-31 ENCOUNTER — Telehealth: Payer: Self-pay | Admitting: Pulmonary Disease

## 2015-05-31 VITALS — BP 124/66 | HR 69 | Ht 60.0 in | Wt 150.4 lb

## 2015-05-31 DIAGNOSIS — J849 Interstitial pulmonary disease, unspecified: Secondary | ICD-10-CM | POA: Diagnosis not present

## 2015-05-31 DIAGNOSIS — K219 Gastro-esophageal reflux disease without esophagitis: Secondary | ICD-10-CM

## 2015-05-31 HISTORY — DX: Interstitial pulmonary disease, unspecified: J84.9

## 2015-05-31 NOTE — Progress Notes (Signed)
Subjective:    Patient ID: Sandra [name], female    DOB: Nov 02, 1933, 82 y.o.   MRN: [id number]  C.C.:  Follow-up for ILD & GERD.  HPI ILD:  Serum lab tests suggest possible scleroderma. Patient has been referred to rheumatology and has a consult appointment on 6/1. She continues to have an intermittent, nonproductive cough. She is continuing to wheeze occasionally with exertion.  GERD:  Started on Zantac empirically at last appointment. She reports she has had some improvement in her reflux. Denies any morning brash water taste. No dysphagia or odynophagia.  Review of Systems No new rashes or bruising. She reports some discomfort in her upper back. No joint swelling, stiffness, or pain otherwise. No fever, chills, or sweats.   Allergies  Allergen Reactions  . Cephalexin Nausea And Vomiting    Pt stated made severely sick, will never take again  . Pioglitazone     REACTION: EDEMA    Current Outpatient Prescriptions on File Prior to Visit  Medication Sig Dispense Refill  . aspirin 81 MG tablet Take 81 mg by mouth daily.    Marland Kitchen atorvastatin (LIPITOR) 20 MG tablet Take 1 tablet (20 mg total) by mouth daily. (Patient taking differently: Take 10 mg by mouth daily. ) 90 tablet 1  . Calcium-Magnesium-Vitamin D (CALCIUM 1200+D3 PO) Take 1 tablet by mouth daily. Vitamin d is 1000iu    . clonazePAM (KLONOPIN) 0.5 MG tablet Take 1 tablet (0.5 mg total) by mouth 2 (two) times daily as needed for anxiety. 30 tablet 3  . Cyanocobalamin (VITAMIN B-12 PO) Take by mouth daily.    . fenofibrate 160 MG tablet Take 1 tablet (160 mg total) by mouth daily. 90 tablet 1  . glimepiride (AMARYL) 4 MG tablet Take 2 mg by mouth 2 (two) times daily.   2  . HYDROcodone-acetaminophen (NORCO/VICODIN) 5-325 MG tablet Take 1 tablet by mouth every 6 (six) hours as needed for moderate pain. 45 tablet 0  . insulin aspart (NOVOLOG FLEXPEN) 100 UNIT/ML FlexPen Inject 15 Units into the skin 3 (three) times daily with meals.     . Insulin Glargine (TOUJEO SOLOSTAR) 300 UNIT/ML SOPN Inject 60 Units into the skin.     . Insulin Syringe-Needle U-100 (INSULIN SYRINGE .5CC/31GX5/16") 31G X 5/16" 0.5 ML MISC Use to inject insulin daily    . lisinopril (PRINIVIL,ZESTRIL) 10 MG tablet Take 10 mg by mouth daily.     . metoprolol succinate (TOPROL-XL) 50 MG 24 hr tablet TAKE 1 TABLET BY MOUTH DAILY. TAKE WITH OR IMMEDIATELY FOLLOWING A MEAL 90 tablet 1  . Omega-3 Fatty Acids (FISH OIL PO) Take by mouth daily.    . ranitidine (ZANTAC) 150 MG tablet Take 1 tablet (150 mg total) by mouth at bedtime. 90 tablet 1  . Vitamin D, Ergocalciferol, (DRISDOL) 50000 units CAPS capsule Take 1 capsule (50,000 Units total) by mouth every 7 (seven) days. 12 capsule 0   No current facility-administered medications on file prior to visit.    Past Medical History  Diagnosis Date  . Depression   . GERD (gastroesophageal reflux disease)   . Hyperlipidemia   . Diabetes mellitus     type 2  . Hypertension   . Osteopenia   . Allergic rhinitis   . Takotsubo cardiomyopathy     with subsequent normalization of left ventricular systolic function  . Hyperlipidemia   . Anxiety   . Acute MI ([place])   . Valvular heart disease   . Acid reflux   .  Degenerative joint disease   . Neck pain, chronic   . Sciatica   . Gait disturbance   . SAH (subarachnoid hemorrhage) ([place])   . TIA (transient ischemic attack)     Past Surgical History  Procedure Laterality Date  . Abdominal hysterectomy  1977    no oophorectomy  . Spinal fusion  2000    Dr. Vertell Limber  . Cataract extraction, bilateral  2011  . Cardiac catheterization    . Appendectomy    . Tonsillectomy      Family History  Problem Relation Age of Onset  . Diabetes Sister   . Asthma Sister   . Diabetes Sister   . Breast cancer      ?Aunt  . Cancer      breast?  . Intracerebral hemorrhage Daughter     assoc with post-partum  . Coronary artery disease Neg Hx     Social History    Social History  . Marital Status: Divorced    Spouse Name: N/A  . Number of Children: 2  . Years of Education: N/A   Occupational History  . Retired     Press photographer   Social History Main Topics  . Smoking status: Passive Smoke Exposure - Never Smoker  . Smokeless tobacco: Never Used     Comment: Mother & Children  . Alcohol Use: No  . Drug Use: No  . Sexual Activity: No   Other Topics Concern  . None   Social History Narrative   Lives with her daughter and G-daughter   Diet- working on portion control   Exercise- no routine exercise but active      [place] [facility]:   Originally from [place]. Always lived in [place]. Previously has traveled to [place], [place], [place], [place], & [place]. Previously did accounting and clerical work. Has a dog currently. Remote exposure to a parakeet. No mold exposure.       Objective:   Physical Exam BP 124/66 mmHg  Pulse 69  Ht 5' (1.524 m)  Wt 150 lb 6.4 oz (68.221 kg)  BMI 29.37 kg/m2  SpO2 96% General:  Awake. Alert. No distress. Mild central obesity.  Integument:  Warm & dry. No rash on exposed skin. No bruising. Lymphatics:  No appreciated cervical or supraclavicular lymphadenoapthy. HEENT:  Moist mucus membranes. No oral ulcers. No scleral injection. Minimal nasal turbinate swelling Cardiovascular:  Regular rate. No edema. Normal S1 & S2.  Pulmonary:  Clear bilaterally to auscultation with good aeration. Normal work of breathing on room air. Speaking in complete sentences. Musculoskeletal:  Normal bulk and tone. No joint deformity or effusion appreciated. Mild kyphosis.  IMAGING HRCT CHEST W/O 05/23/15 (personally reviewed by me): No pathologic mediastinal adenopathy. No pleural effusion or thickening. No pericardial effusion. 81m nodule in inferior lingula. No traction bronchiectasis or honeycomb changes. Patchy areas of ground glass and reticulation peripherally. Som evidence of air trapping on exhalation.   CT CHEST W/O (personally reviewed  by me): Small hiatal hernia. Borderline mediastinal adenopathy. No pleural effusion or thickening. No pericardial effusion. Patchy bilateral ground glass. No parenchymal nodule.   CARDIAC TTE (11/28/10): LV normal in size with EF 60%. Cannot exclude regional wall motion abnormalities. LA normal in size. RA upper limits of normal in size. RV normal in size and function.o aortic stenosis or regurgitation. No mitral stenosis or regurgitation.No pulmonic regurgitation. No pericardial effusion.  LABS 05/23/15 CBC: 5.3/12.3/37.4/331 BMP: 140/4.5/108/20/32/1.42/302/9.0 LFT: 4.0/6.7/0.3/52/22/16 UA: SG 1025/Protein Negative/RBC Negative/WBC 0-5/Sqam Epith 0-5  05/10/15 ESR:  25 CRP: 0.6 Anti-CCP: <16 RF: <10 Anti-Jo1: <0.2 Centromere Ab: <0.2 DS DNA Ab: <1 Smith Ab: <0.2 Chromatin Ab: <0.2 RNP Ab: <0.2 SSA: 1.5/1.2 SSB: <0.2/<1.0 SCL-70: 4.1/3.1 Hypersensitivity Pneumonitis Panel: Negative   04/04/15 CBC: 6.8/12.6/36.6/314 BMP: 137/4.3/101/29/23/1.4/247/9.4 LFT: 4.0/7.2/0.4/50/21/19 PRO-BNP: 51    Assessment & Plan:  82 year old female with ongoing dyspnea possibly secondary to ILD. Some suggestion of airway dysfunction on her HRCT however. Patient has been referred to rheumatology and has an appointment on 6/1 for evaluation. At this time given the mild appearance of the changes on her high-resolution CT scan I do not feel that immunosuppression is necessary. Her reflux seems to be adequately controlled at this time. I do question whether or not she may benefit from inhaler medication therapy with her small airways dysfunction seen on exhalation images. However, I am holding on initiating any inhaler medication pending her pulmonary function testing in June with normal physical exam today. I instructed the patient to contact my office if she had any new breathing problems or questions before her next appointment.  1. ILD: Patient has appointment with rheumatology in June for evaluation for  possible scleroderma. Already scheduled for 6 minute walk test on room air & full pulmonary function testing. Holding off on immunosuppression at this time. 2. Possible Small Airways Disease: Already scheduled for full pulmonary function testing in June. 3. GERD: Controlled with Zantac. No changes at this time. 4. Follow-up: Patient will keep her follow-up appointments in June as scheduled.  Sonia Baller Ashok Cordia, M.D. [facility] Pulmonary & Critical Care Pager:  [phone] After 3pm or if no response, call [phone] 10:08 AM 05/31/2015

## 2015-05-31 NOTE — Patient Instructions (Signed)
   Call me if you have any new breathing problems or questions before your next appointment  I will see you back in June as scheduled

## 2015-05-31 NOTE — Telephone Encounter (Signed)
IMAGING HRCT CHEST W/O 05/23/15 (personally reviewed by me): No pathologic mediastinal adenopathy. No pleural effusion or thickening. No pericardial effusion. 80m nodule in inferior lingula. No traction bronchiectasis or honeycomb changes. Patchy areas of ground glass and reticulation peripherally. Som evidence of air trapping on exhalation.   CT CHEST W/O (personally reviewed by me):  Small hiatal hernia. Borderline mediastinal adenopathy. No pleural effusion or thickening. No pericardial effusion. Patchy bilateral ground glass. No parenchymal nodule.   LABS 05/23/15 CBC: 5.3/12.3/37.4/331 BMP: 140/4.5/108/20/32/1.42/302/9.0 LFT: 4.0/6.7/0.3/52/22/16 UA: SG 1025/Protein Negative/RBC Negative/WBC 0-5/Sqam Epith 0-5  05/10/15 ESR: 25 CRP: 0.6 Anti-CCP: <16 RF: <10 Anti-Jo1: <0.2 Centromere Ab: <0.2 DS DNA Ab: <1 Smith Ab: <0.2 Chromatin Ab: <0.2 RNP Ab: <0.2 SSA: 1.5/1.2 SSB: <0.2/<1.0 SCL-70: 4.1/3.1 Hypersensitivity Pneumonitis Panel: Negative

## 2015-06-20 DIAGNOSIS — R0602 Shortness of breath: Secondary | ICD-10-CM | POA: Diagnosis not present

## 2015-06-20 DIAGNOSIS — R5383 Other fatigue: Secondary | ICD-10-CM | POA: Diagnosis not present

## 2015-06-20 DIAGNOSIS — M5136 Other intervertebral disc degeneration, lumbar region: Secondary | ICD-10-CM | POA: Diagnosis not present

## 2015-06-20 DIAGNOSIS — R768 Other specified abnormal immunological findings in serum: Secondary | ICD-10-CM | POA: Diagnosis not present

## 2015-06-20 DIAGNOSIS — M545 Low back pain: Secondary | ICD-10-CM | POA: Diagnosis not present

## 2015-06-20 DIAGNOSIS — J849 Interstitial pulmonary disease, unspecified: Secondary | ICD-10-CM | POA: Diagnosis not present

## 2015-06-24 ENCOUNTER — Ambulatory Visit (INDEPENDENT_AMBULATORY_CARE_PROVIDER_SITE_OTHER): Payer: Commercial Managed Care - HMO | Admitting: Pulmonary Disease

## 2015-06-24 DIAGNOSIS — R0600 Dyspnea, unspecified: Secondary | ICD-10-CM

## 2015-06-24 LAB — PULMONARY FUNCTION TEST
DL/VA % PRED: 111 %
DL/VA: 4.73 ml/min/mmHg/L
DLCO cor % pred: 70 %
DLCO cor: 13.22 ml/min/mmHg
DLCO unc % pred: 66 %
DLCO unc: 12.47 ml/min/mmHg
FEF 25-75 Post: 2.11 L/sec
FEF 25-75 Pre: 1.57 L/sec
FEF2575-%CHANGE-POST: 33 %
FEF2575-%PRED-POST: 174 %
FEF2575-%Pred-Pre: 130 %
FEV1-%CHANGE-POST: 1 %
FEV1-%Pred-Post: 88 %
FEV1-%Pred-Pre: 87 %
FEV1-PRE: 1.39 L
FEV1-Post: 1.41 L
FEV1FVC-%CHANGE-POST: 8 %
FEV1FVC-%Pred-Pre: 113 %
FEV6-%Change-Post: -5 %
FEV6-%PRED-POST: 75 %
FEV6-%PRED-PRE: 80 %
FEV6-PRE: 1.64 L
FEV6-Post: 1.54 L
FEV6FVC-%Change-Post: 0 %
FEV6FVC-%PRED-PRE: 105 %
FEV6FVC-%Pred-Post: 106 %
FVC-%CHANGE-POST: -6 %
FVC-%PRED-POST: 71 %
FVC-%Pred-Pre: 76 %
FVC-Post: 1.54 L
FVC-Pre: 1.65 L
POST FEV1/FVC RATIO: 91 %
PRE FEV6/FVC RATIO: 99 %
Post FEV6/FVC ratio: 100 %
Pre FEV1/FVC ratio: 84 %
RV % PRED: 52 %
RV: 1.14 L
TLC % pred: 65 %
TLC: 2.9 L

## 2015-06-24 NOTE — Progress Notes (Signed)
PFT done today. 

## 2015-06-24 NOTE — Progress Notes (Signed)
PFT 06/24/15: FVC 1.65 L (76%) FEV1 1.39 L (87%) FEV1/FVC 0.84 FEF 25-75 1.57 L (130%) no bronchodilator response TLC 2.90 L (65%) RV 52% ERV 71% DLCO corrected 70% (hemoglobin 11.7)  6MWT 06/24/15:  Walked 240 meters / Baseline sat 96% on RA / Nadir Sat 94% on RA

## 2015-06-28 ENCOUNTER — Encounter: Payer: Self-pay | Admitting: Pulmonary Disease

## 2015-06-28 ENCOUNTER — Ambulatory Visit (INDEPENDENT_AMBULATORY_CARE_PROVIDER_SITE_OTHER): Payer: Commercial Managed Care - HMO | Admitting: Pulmonary Disease

## 2015-06-28 VITALS — BP 122/62 | HR 76 | Ht 60.0 in | Wt 153.0 lb

## 2015-06-28 DIAGNOSIS — R911 Solitary pulmonary nodule: Secondary | ICD-10-CM | POA: Diagnosis not present

## 2015-06-28 DIAGNOSIS — J849 Interstitial pulmonary disease, unspecified: Secondary | ICD-10-CM | POA: Diagnosis not present

## 2015-06-28 DIAGNOSIS — K219 Gastro-esophageal reflux disease without esophagitis: Secondary | ICD-10-CM

## 2015-06-28 DIAGNOSIS — IMO0001 Reserved for inherently not codable concepts without codable children: Secondary | ICD-10-CM

## 2015-06-28 NOTE — Progress Notes (Signed)
Subjective:    Patient ID: Sandra [name], female    DOB: 1933/02/18, 82 y.o.   MRN: [id number]  C.C.:  Follow-up for ILD, Lingula Nodule, & GERD.  HPI ILD:  Possibly secondary to Scleroderma. Mild restriction on lung volumes with normal carbon monoxide capacity and no oxygen requirement with 6 minute walk test. She reports mild, intermittent coughing. Cough is nonproductive. Reports dyspnea has improved slightly. Doesn't feel she is taking deep breaths. Wheezing has improved as well. Has been seen by Rheumatology.  Lingula Nodule: 28m in inferior segment. Seen on high-resolution CT imaging May 2017.  GERD:  On Zantac. No reflux, dyspepsia or morning brash water taste.  No dysphagia or odynophagia.  Review of Systems No fever, chills, or sweats. No new joint pain, swelling, or erythema.   Allergies  Allergen Reactions  . Cephalexin Nausea And Vomiting    Pt stated made severely sick, will never take again  . Pioglitazone     REACTION: EDEMA    Current Outpatient Prescriptions on File Prior to Visit  Medication Sig Dispense Refill  . aspirin 81 MG tablet Take 81 mg by mouth daily.    .Marland Kitchenatorvastatin (LIPITOR) 20 MG tablet Take 1 tablet (20 mg total) by mouth daily. (Patient taking differently: Take 10 mg by mouth daily. ) 90 tablet 1  . Calcium-Magnesium-Vitamin D (CALCIUM 1200+D3 PO) Take 1 tablet by mouth daily. Vitamin d is 1000iu    . clonazePAM (KLONOPIN) 0.5 MG tablet Take 1 tablet (0.5 mg total) by mouth 2 (two) times daily as needed for anxiety. 30 tablet 3  . Cyanocobalamin (VITAMIN B-12 PO) Take by mouth daily.    . fenofibrate 160 MG tablet Take 1 tablet (160 mg total) by mouth daily. 90 tablet 1  . glimepiride (AMARYL) 4 MG tablet Take 2 mg by mouth 2 (two) times daily.   2  . HYDROcodone-acetaminophen (NORCO/VICODIN) 5-325 MG tablet Take 1 tablet by mouth every 6 (six) hours as needed for moderate pain. 45 tablet 0  . insulin aspart (NOVOLOG FLEXPEN) 100 UNIT/ML FlexPen  Inject 15 Units into the skin 3 (three) times daily with meals.    . Insulin Glargine (TOUJEO SOLOSTAR) 300 UNIT/ML SOPN Inject 60 Units into the skin.     . Insulin Syringe-Needle U-100 (INSULIN SYRINGE .5CC/31GX5/16") 31G X 5/16" 0.5 ML MISC Use to inject insulin daily    . lisinopril (PRINIVIL,ZESTRIL) 10 MG tablet Take 10 mg by mouth daily.     . metoprolol succinate (TOPROL-XL) 50 MG 24 hr tablet TAKE 1 TABLET BY MOUTH DAILY. TAKE WITH OR IMMEDIATELY FOLLOWING A MEAL 90 tablet 1  . Omega-3 Fatty Acids (FISH OIL PO) Take by mouth daily.    . ranitidine (ZANTAC) 150 MG tablet Take 1 tablet (150 mg total) by mouth at bedtime. 90 tablet 1  . Vitamin D, Ergocalciferol, (DRISDOL) 50000 units CAPS capsule Take 1 capsule (50,000 Units total) by mouth every 7 (seven) days. 12 capsule 0   No current facility-administered medications on file prior to visit.    Past Medical History  Diagnosis Date  . Depression   . GERD (gastroesophageal reflux disease)   . Hyperlipidemia   . Diabetes mellitus     type 2  . Hypertension   . Osteopenia   . Allergic rhinitis   . Takotsubo cardiomyopathy     with subsequent normalization of left ventricular systolic function  . Hyperlipidemia   . Anxiety   . Acute MI ([place]   .  Valvular heart disease   . Acid reflux   . Degenerative joint disease   . Neck pain, chronic   . Sciatica   . Gait disturbance   . SAH (subarachnoid hemorrhage) ([place])   . TIA (transient ischemic attack)     Past Surgical History  Procedure Laterality Date  . Abdominal hysterectomy  1977    no oophorectomy  . Spinal fusion  2000    Dr. Vertell Limber  . Cataract extraction, bilateral  2011  . Cardiac catheterization    . Appendectomy    . Tonsillectomy      Family History  Problem Relation Age of Onset  . Diabetes Sister   . Asthma Sister   . Diabetes Sister   . Breast cancer      ?Aunt  . Cancer      breast?  . Intracerebral hemorrhage Daughter     assoc with post-partum   . Coronary artery disease Neg Hx     Social History   Social History  . Marital Status: Divorced    Spouse Name: N/A  . Number of Children: 2  . Years of Education: N/A   Occupational History  . Retired     Press photographer   Social History Main Topics  . Smoking status: Passive Smoke Exposure - Never Smoker  . Smokeless tobacco: Never Used     Comment: Mother & Children  . Alcohol Use: No  . Drug Use: No  . Sexual Activity: No   Other Topics Concern  . None   Social History Narrative   Lives with her daughter and G-daughter   Diet- working on portion control   Exercise- no routine exercise but active      [place] [facility]:   Originally from [place]. Always lived in [place]. Previously has traveled to [place], [place], [place], [place], & [place] States. Previously did accounting and clerical work. Has a dog currently. Remote exposure to a parakeet. No mold exposure.       Objective:   Physical Exam BP 122/62 mmHg  Pulse 76  Ht 5' (1.524 m)  Wt 153 lb (69.4 kg)  BMI 29.88 kg/m2  SpO2 96% General:  Awake. Alert. No distress. Appears comfortable.  Integument:  Warm & dry. No rash on exposed skin.  Lymphatics:  No appreciated cervical or supraclavicular lymphadenoapthy. HEENT:  Moist mucus membranes. No oral ulcers. No scleral injection.  Cardiovascular:  Regular rate. No edema. Normal S1 & S2.  Pulmonary:  Faint crackles right lung base. Otherwise clear to auscultation. Normal work of breathing on room air. Musculoskeletal:  Normal bulk and tone. No joint deformity or effusion appreciated. Mild kyphosis.  PFT 06/24/15: FVC 1.65 L (76%) FEV1 1.39 L (87%) FEV1/FVC 0.84 FEF 25-75 1.57 L (130%) no bronchodilator response TLC 2.90 L (65%) RV 52% ERV 71% DLCO corrected 70% (hemoglobin 11.7)  6MWT 06/24/15: Walked 240 meters / Baseline sat 96% on RA / Nadir Sat 94% on RA  IMAGING HRCT CHEST W/O 05/23/15 (previously reviewed by me): No pathologic mediastinal adenopathy. No pleural effusion or  thickening. No pericardial effusion. 32m nodule in inferior lingula. No traction bronchiectasis or honeycomb changes. Patchy areas of ground glass and reticulation peripherally. Some evidence of air trapping on exhalation.   CT CHEST W/O (previously reviewed by me): Small hiatal hernia. Borderline mediastinal adenopathy. No pleural effusion or thickening. No pericardial effusion. Patchy bilateral ground glass. No parenchymal nodule.   CARDIAC TTE (11/28/10): LV normal in size with EF 60%. Cannot exclude regional  wall motion abnormalities. LA normal in size. RA upper limits of normal in size. RV normal in size and function.o aortic stenosis or regurgitation. No mitral stenosis or regurgitation.No pulmonic regurgitation. No pericardial effusion.  LABS 05/23/15 CBC: 5.3/12.3/37.4/331 BMP: 140/4.5/108/20/32/1.42/302/9.0 LFT: 4.0/6.7/0.3/52/22/16 UA: SG 1025/Protein Negative/RBC Negative/WBC 0-5/Sqam Epith 0-5  05/10/15 ESR: 25 CRP: 0.6 Anti-CCP: <16 RF: <10 Anti-Jo1: <0.2 Centromere Ab: <0.2 DS DNA Ab: <1 Smith Ab: <0.2 Chromatin Ab: <0.2 RNP Ab: <0.2 SSA: 1.5/1.2 SSB: <0.2/<1.0 SCL-70: 4.1/3.1 Hypersensitivity Pneumonitis Panel: Negative   04/04/15 CBC: 6.8/12.6/36.6/314 BMP: 137/4.3/101/29/23/1.4/247/9.4 LFT: 4.0/7.2/0.4/50/21/19 PRO-BNP: 51    Assessment & Plan:  82 year old female with ILD likely secondary to underlying scleroderma. Patient's spirometry today is does not suggest concomitant obstruction with her mild restriction. Mild restriction is likely secondary to her underlying interstitial lung disease which is mild on high-resolution CT imaging. At this time I'm holding off on initiating immunosuppression for possible underlying scleroderma. We are also deferring further CT image follow-up of her lung nodule until I can discuss this further at her follow-up appointment. She does have a repeat transthoracic echocardiogram planned for July and follow-up with rheumatology. I  instructed the patient to contact my office if she had any new breathing problems before next appointment otherwise be happy to see her sooner.  1. ILD: Repeat spirometry with DLCO and 6 minute walk test on room air at next appointment. 2. Lingula Nodule: Plan to discuss repeat CT imaging at follow-up appointment. 3. GERD: Well controlled with Zantac. No changes at this time. 4. Health Maintenance: Status post influenza vaccine September 2016, Prevnar October 2015, & Pneumovax November 2011. Follow-up: Return to clinic in 3 months or sooner if needed.  Sonia Baller Ashok Cordia, M.D. [facility] Pulmonary & Critical Care Pager:  [phone] After 3pm or if no response, call [phone] 2:42 PM 06/28/2015

## 2015-06-28 NOTE — Patient Instructions (Signed)
   Call me if you have any new breathing problems before your next appointment.  I will see you back in 3 months or sooner if needed.   TESTS ORDERED: 1. Spirometry with DLCO at next appointment 2. 6MWT on room air at next appointment

## 2015-07-09 DIAGNOSIS — E1165 Type 2 diabetes mellitus with hyperglycemia: Secondary | ICD-10-CM | POA: Diagnosis not present

## 2015-07-09 DIAGNOSIS — Z794 Long term (current) use of insulin: Secondary | ICD-10-CM | POA: Diagnosis not present

## 2015-07-09 DIAGNOSIS — E785 Hyperlipidemia, unspecified: Secondary | ICD-10-CM | POA: Diagnosis not present

## 2015-07-09 DIAGNOSIS — E1169 Type 2 diabetes mellitus with other specified complication: Secondary | ICD-10-CM | POA: Diagnosis not present

## 2015-07-09 DIAGNOSIS — N183 Chronic kidney disease, stage 3 (moderate): Secondary | ICD-10-CM | POA: Diagnosis not present

## 2015-07-09 DIAGNOSIS — E1159 Type 2 diabetes mellitus with other circulatory complications: Secondary | ICD-10-CM | POA: Diagnosis not present

## 2015-07-09 DIAGNOSIS — F419 Anxiety disorder, unspecified: Secondary | ICD-10-CM | POA: Diagnosis not present

## 2015-07-09 DIAGNOSIS — F329 Major depressive disorder, single episode, unspecified: Secondary | ICD-10-CM | POA: Diagnosis not present

## 2015-07-09 DIAGNOSIS — I129 Hypertensive chronic kidney disease with stage 1 through stage 4 chronic kidney disease, or unspecified chronic kidney disease: Secondary | ICD-10-CM | POA: Diagnosis not present

## 2015-07-09 DIAGNOSIS — I252 Old myocardial infarction: Secondary | ICD-10-CM | POA: Diagnosis not present

## 2015-07-29 DIAGNOSIS — Z961 Presence of intraocular lens: Secondary | ICD-10-CM | POA: Diagnosis not present

## 2015-07-29 DIAGNOSIS — H43393 Other vitreous opacities, bilateral: Secondary | ICD-10-CM | POA: Diagnosis not present

## 2015-07-29 DIAGNOSIS — H35033 Hypertensive retinopathy, bilateral: Secondary | ICD-10-CM | POA: Diagnosis not present

## 2015-07-29 DIAGNOSIS — E119 Type 2 diabetes mellitus without complications: Secondary | ICD-10-CM | POA: Diagnosis not present

## 2015-07-29 DIAGNOSIS — H35361 Drusen (degenerative) of macula, right eye: Secondary | ICD-10-CM | POA: Diagnosis not present

## 2015-07-29 LAB — HM DIABETES EYE EXAM

## 2015-07-31 ENCOUNTER — Encounter: Payer: Self-pay | Admitting: Family Medicine

## 2015-08-15 ENCOUNTER — Ambulatory Visit (HOSPITAL_COMMUNITY)
Admission: RE | Admit: 2015-08-15 | Discharge: 2015-08-15 | Disposition: A | Discharge status: Home / Self Care | Payer: Medicare HMO | Source: Ambulatory Visit | Attending: Internal Medicine | Admitting: Internal Medicine

## 2015-08-15 ENCOUNTER — Encounter (HOSPITAL_COMMUNITY): Payer: Commercial Managed Care - HMO | Admitting: Internal Medicine

## 2015-08-15 DIAGNOSIS — E119 Type 2 diabetes mellitus without complications: Secondary | ICD-10-CM | POA: Diagnosis not present

## 2015-08-15 DIAGNOSIS — I272 Other secondary pulmonary hypertension: Secondary | ICD-10-CM | POA: Insufficient documentation

## 2015-08-15 DIAGNOSIS — I119 Hypertensive heart disease without heart failure: Secondary | ICD-10-CM | POA: Diagnosis not present

## 2015-08-15 NOTE — Progress Notes (Signed)
*  PRELIMINARY RESULTS* Echocardiogram 2D Echocardiogram has been performed.  Sandra [name] 08/15/2015, 10:37 AM

## 2015-09-02 ENCOUNTER — Encounter (HOSPITAL_COMMUNITY): Payer: Commercial Managed Care - HMO | Admitting: Internal Medicine

## 2015-09-02 ENCOUNTER — Ambulatory Visit (HOSPITAL_COMMUNITY)
Admission: RE | Admit: 2015-09-02 | Discharge: 2015-09-02 | Disposition: A | Discharge status: Home / Self Care | Payer: Medicare HMO | Source: Ambulatory Visit | Attending: Internal Medicine | Admitting: Internal Medicine

## 2015-09-02 VITALS — BP 176/84 | HR 75 | Wt 153.0 lb

## 2015-09-02 DIAGNOSIS — I252 Old myocardial infarction: Secondary | ICD-10-CM | POA: Diagnosis not present

## 2015-09-02 DIAGNOSIS — Z7982 Long term (current) use of aspirin: Secondary | ICD-10-CM | POA: Insufficient documentation

## 2015-09-02 DIAGNOSIS — Z825 Family history of asthma and other chronic lower respiratory diseases: Secondary | ICD-10-CM | POA: Insufficient documentation

## 2015-09-02 DIAGNOSIS — E669 Obesity, unspecified: Secondary | ICD-10-CM | POA: Insufficient documentation

## 2015-09-02 DIAGNOSIS — M8580 Other specified disorders of bone density and structure, unspecified site: Secondary | ICD-10-CM | POA: Insufficient documentation

## 2015-09-02 DIAGNOSIS — Z8673 Personal history of transient ischemic attack (TIA), and cerebral infarction without residual deficits: Secondary | ICD-10-CM | POA: Insufficient documentation

## 2015-09-02 DIAGNOSIS — Z881 Allergy status to other antibiotic agents status: Secondary | ICD-10-CM | POA: Diagnosis not present

## 2015-09-02 DIAGNOSIS — E119 Type 2 diabetes mellitus without complications: Secondary | ICD-10-CM | POA: Insufficient documentation

## 2015-09-02 DIAGNOSIS — K219 Gastro-esophageal reflux disease without esophagitis: Secondary | ICD-10-CM | POA: Insufficient documentation

## 2015-09-02 DIAGNOSIS — E785 Hyperlipidemia, unspecified: Secondary | ICD-10-CM | POA: Insufficient documentation

## 2015-09-02 DIAGNOSIS — R942 Abnormal results of pulmonary function studies: Secondary | ICD-10-CM

## 2015-09-02 DIAGNOSIS — I272 Other secondary pulmonary hypertension: Secondary | ICD-10-CM | POA: Diagnosis not present

## 2015-09-02 DIAGNOSIS — I10 Essential (primary) hypertension: Secondary | ICD-10-CM | POA: Insufficient documentation

## 2015-09-02 DIAGNOSIS — Z833 Family history of diabetes mellitus: Secondary | ICD-10-CM | POA: Insufficient documentation

## 2015-09-02 DIAGNOSIS — Z794 Long term (current) use of insulin: Secondary | ICD-10-CM | POA: Insufficient documentation

## 2015-09-02 DIAGNOSIS — M349 Systemic sclerosis, unspecified: Secondary | ICD-10-CM | POA: Insufficient documentation

## 2015-09-02 DIAGNOSIS — J849 Interstitial pulmonary disease, unspecified: Secondary | ICD-10-CM | POA: Insufficient documentation

## 2015-09-02 DIAGNOSIS — Z6829 Body mass index (BMI) 29.0-29.9, adult: Secondary | ICD-10-CM | POA: Insufficient documentation

## 2015-09-02 DIAGNOSIS — Z79899 Other long term (current) drug therapy: Secondary | ICD-10-CM | POA: Diagnosis not present

## 2015-09-02 DIAGNOSIS — Z888 Allergy status to other drugs, medicaments and biological substances status: Secondary | ICD-10-CM | POA: Diagnosis not present
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
## 2015-09-02 NOTE — Patient Instructions (Signed)
Follow up with echocardiogram and appointment with Dr. Haroldine Laws in one year.  We are so happy to be a part of your care! Please call us for any questions/concerns!

## 2015-09-11 ENCOUNTER — Other Ambulatory Visit: Payer: Self-pay | Admitting: Pulmonary Disease

## 2015-09-17 DIAGNOSIS — J849 Interstitial pulmonary disease, unspecified: Secondary | ICD-10-CM | POA: Diagnosis not present

## 2015-09-17 DIAGNOSIS — M5136 Other intervertebral disc degeneration, lumbar region: Secondary | ICD-10-CM | POA: Diagnosis not present

## 2015-09-17 DIAGNOSIS — R0602 Shortness of breath: Secondary | ICD-10-CM | POA: Diagnosis not present

## 2015-09-17 DIAGNOSIS — R768 Other specified abnormal immunological findings in serum: Secondary | ICD-10-CM | POA: Diagnosis not present

## 2015-09-24 ENCOUNTER — Encounter (HOSPITAL_COMMUNITY): Payer: Self-pay | Admitting: Family Medicine

## 2015-09-24 ENCOUNTER — Ambulatory Visit (HOSPITAL_COMMUNITY)
Admission: EM | Admit: 2015-09-24 | Discharge: 2015-09-24 | Disposition: A | Discharge status: Home / Self Care | Payer: Commercial Managed Care - HMO | Attending: Family Medicine | Admitting: Family Medicine

## 2015-09-24 DIAGNOSIS — M7750 Other enthesopathy of unspecified foot: Secondary | ICD-10-CM

## 2015-09-24 DIAGNOSIS — M6588 Other synovitis and tenosynovitis, other site: Secondary | ICD-10-CM | POA: Diagnosis not present

## 2015-09-24 MED ORDER — PREDNISONE 20 MG PO TABS: ORAL_TABLET | ORAL | 0 refills | Status: DC

## 2015-09-24 NOTE — ED Provider Notes (Signed)
[facility]    CSN: [id number] Arrival date & time: 09/24/15  1335  First Provider Contact:  First MD Initiated Contact with Patient 09/24/15 1356        History   Chief Complaint Chief Complaint  Patient presents with  . Foot Pain    HPI Sandra [name] is a 82 y.o. female.   This is a 82 year old woman who comes in with left foot pain, beginning 2 days ago. The pain began about 24 hours after she did more extensive walking.  Her granddaughter has recently developed pneumonia and she's been going back and forth to [facility].  She notes that the lateral aspect of the foot is reddened and quite tender.  Patient denies any acute trauma      Past Medical History:  Diagnosis Date  . Acid reflux   . Acute MI ([place])   . Allergic rhinitis   . Anxiety   . Degenerative joint disease   . Depression   . Diabetes mellitus    type 2  . Gait disturbance   . GERD (gastroesophageal reflux disease)   . Hyperlipidemia   . Hyperlipidemia   . Hypertension   . Neck pain, chronic   . Osteopenia   . SAH (subarachnoid hemorrhage) ([place])   . Sciatica   . Takotsubo cardiomyopathy    with subsequent normalization of left ventricular systolic function  . TIA (transient ischemic attack)   . Valvular heart disease     Patient Active Problem List   Diagnosis Date Noted  . ILD (interstitial lung disease) ([place]) 05/31/2015  . Shortness of breath 04/04/2015  . Type 2 diabetes mellitus with hyperglycemia ([place]) 03/11/2015  . Diabetes mellitus type 2 with retinopathy ([place]) 03/04/2015  . Vitamin D deficiency 10/01/2014  . Leukocytopenia 05/31/2014  . Chronic kidney disease (CKD), stage III (moderate) 05/31/2014  . Fatigue 05/31/2014  . Lower abdominal pain 05/17/2014  . Nausea with vomiting 09/22/2013  . Chronic right SI joint pain 09/22/2013  . 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 Abrasion of ear canal 09/28/2012  . Subarachnoid hemorrhage ([place]) 04/07/2012  .  Unspecified cerebral artery occlusion with cerebral infarction 04/07/2012  . UTI (urinary tract infection) 11/02/2011  . General medical examination 01/07/2011  . SAH (subarachnoid hemorrhage) ([place]) 12/03/2010  . Chest pain 11/27/2010  . Achilles tendon injury 11/27/2010  . Back pain 07/01/2010  . Breast pain 04/15/2010  . GAIT DISTURBANCE 08/07/2009  . Anxiety state 07/03/2009  . VALVULAR HEART DISEASE 05/23/2008  . NECK PAIN, CHRONIC 04/03/2008  . AMI 06/23/2006  . DEGENERATIVE JOINT DISEASE, CERVICAL SPINE 06/23/2006  . Osteoporosis 06/23/2006  . Hyperlipidemia associated with type 2 diabetes mellitus ([place]) 05/12/2006  . DEPRESSION 05/12/2006  . Essential hypertension 05/12/2006  . ALLERGIC RHINITIS 05/12/2006  . ACID REFLUX DISEASE 05/12/2006  . SCIATICA 05/12/2006    Past Surgical History:  Procedure Laterality Date  . ABDOMINAL HYSTERECTOMY  1977   no oophorectomy  . APPENDECTOMY    . CARDIAC CATHETERIZATION    . CATARACT EXTRACTION, BILATERAL  2011  . SPINAL FUSION  2000   Dr. Vertell Limber  . TONSILLECTOMY      OB History    No data available       Home Medications    Prior to Admission medications   Medication Sig Start Date End Date Taking? Authorizing Provider  aspirin 81 MG tablet Take 81 mg by mouth daily.    Historical Provider, MD  atorvastatin (LIPITOR) 20 MG tablet Take  20 mg by mouth daily.    Historical Provider, MD  Calcium-Magnesium-Vitamin D (CALCIUM 1200+D3 PO) Take 1 tablet by mouth daily. Vitamin d is 1000iu    Historical Provider, MD  clonazePAM (KLONOPIN) 0.5 MG tablet Take 1 tablet (0.5 mg total) by mouth 2 (two) times daily as needed for anxiety. 04/04/15   Midge Minium, MD  Cyanocobalamin (VITAMIN B-12 PO) Take by mouth daily.    Historical Provider, MD  fenofibrate 160 MG tablet Take 1 tablet (160 mg total) by mouth daily. 01/03/15   Midge Minium, MD  glimepiride (AMARYL) 4 MG tablet Take 2 mg by mouth 2 (two) times daily.  02/19/14    Historical Provider, MD  HYDROcodone-acetaminophen (NORCO/VICODIN) 5-325 MG tablet Take 1 tablet by mouth every 6 (six) hours as needed for moderate pain. 04/04/15   Midge Minium, MD  insulin aspart (NOVOLOG FLEXPEN) 100 UNIT/ML FlexPen Inject 15 Units into the skin 3 (three) times daily with meals.    Historical Provider, MD  Insulin Glargine (TOUJEO SOLOSTAR) 300 UNIT/ML SOPN Inject 60 Units into the skin.  12/07/14   Historical Provider, MD  Insulin Syringe-Needle U-100 (INSULIN SYRINGE .5CC/31GX5/16") 31G X 5/16" 0.5 ML MISC Use to inject insulin daily 01/26/13   Historical Provider, MD  lisinopril (PRINIVIL,ZESTRIL) 10 MG tablet Take 10 mg by mouth daily.     Historical Provider, MD  metoprolol succinate (TOPROL-XL) 50 MG 24 hr tablet TAKE 1 TABLET BY MOUTH DAILY. TAKE WITH OR IMMEDIATELY FOLLOWING A MEAL 01/03/15   Midge Minium, MD  Omega-3 Fatty Acids (FISH OIL PO) Take by mouth daily.    Historical Provider, MD  predniSONE (DELTASONE) 20 MG tablet Two daily with food 09/24/15   Robyn Haber, MD  ranitidine (ZANTAC) 150 MG tablet TAKE 1 TABLET AT BEDTIME 09/11/15   Javier Glazier, MD  Vitamin D, Ergocalciferol, (DRISDOL) 50000 units CAPS capsule Take 1 capsule (50,000 Units total) by mouth every 7 (seven) days. 04/08/15   Midge Minium, MD    Family History Family History  Problem Relation Age of Onset  . Diabetes Sister   . Asthma Sister   . Diabetes Sister   . Breast cancer      ?Aunt  . Cancer      breast?  . Intracerebral hemorrhage Daughter     assoc with post-partum  . Coronary artery disease Neg Hx     Social History Social History  Substance Use Topics  . Smoking status: Passive Smoke Exposure - Never Smoker  . Smokeless tobacco: Never Used     Comment: Mother & Children  . Alcohol use No     Allergies   Cephalexin and Pioglitazone   Review of Systems Review of Systems  Constitutional: Negative.   HENT: Negative.   Eyes: Negative.     Cardiovascular: Negative.   Musculoskeletal: Positive for gait problem.     Physical Exam Triage Vital Signs ED Triage Vitals  Enc Vitals Group     BP      Pulse      Resp      Temp      Temp src      SpO2      Weight      Height      Head Circumference      Peak Flow      Pain Score      Pain Loc      Pain Edu?      Excl.  in [place]?    No data found.   Updated Vital Signs BP (!) 137/42 (BP Location: Left Arm) Comment: notified rn  Pulse 91   Temp 98 F (36.7 C) (Oral)   Resp 12   SpO2 98%   Visual Acuity Right Eye Distance:   Left Eye Distance:   Bilateral Distance:    Right Eye Near:   Left Eye Near:    Bilateral Near:     Physical Exam  Constitutional: She appears well-developed and well-nourished.  Musculoskeletal: Normal range of motion.  Patient is tender over the file aspect of her left foot with a perineal tendon comes in. She has no bony tenderness whatsoever in her foot. There is minimal swelling on the lateral aspect of her left foot below her lateral malleolus  Skin: There is erythema.  Nursing note and vitals reviewed.    UC Treatments / Results  Labs (all labs ordered are listed, but only abnormal results are displayed) Labs Reviewed - No data to display  EKG  EKG Interpretation None       Radiology No results found.  Procedures Procedures (including critical care time)  Medications Ordered in UC Medications - No data to display   Initial Impression / Assessment and Plan / UC Course  I have reviewed the triage vital signs and the nursing notes.  Pertinent labs & imaging results that were available during my care of the patient were reviewed by me and considered in my medical decision making (see chart for details).  Clinical Course    Final Clinical Impressions(s) / UC Diagnoses   Final diagnoses:  Tendonitis of foot    New Prescriptions New Prescriptions   PREDNISONE (DELTASONE) 20 MG TABLET    Two daily with food      Robyn Haber, MD 09/24/15 1415

## 2015-09-24 NOTE — ED Triage Notes (Signed)
Pt here for left foot pain . sts that she has been doing a lot of extra walking lately.

## 2015-09-26 ENCOUNTER — Telehealth: Payer: Self-pay | Admitting: Pulmonary Disease

## 2015-09-26 NOTE — Telephone Encounter (Signed)
Pt called back and is needing to reschedule her appts for 10/01/15 for the 6MW, PFT and OV with JN. (appts cancelled) Pt was very frustrated on the phone and refused to wait while I scheduled these appts. I tried to explain to her that it would not be a quick process and that I would take a few moments to reschedule all these appts bc they are all on the same day - pt became very angry and asked why this not taken care of earlier today when she called and she stated that "she is fed up with our office and did not have time for this." I apologized for the inconvenience and explained that I was working as quickly as I could to schedule the requested appts. The patient stated that she did not want to wait on me any longer and wanted me to give her the appt "right now", I again told the patient that I was working as quickly as I could to schedule and it would just be 1 more moment. The patient then started shouting stating that I was taking too long and that she wanted to just cancel the appts all together and that she was not planning to come back to our office - pt then hung up the phone.   Will send to [facility] to follow up

## 2015-09-26 NOTE — Telephone Encounter (Signed)
Scheduled patient for 10/25/15 and left message on pt vm advising of appointment asked her to call us back to confirm that these appointments will work for her. - pr

## 2015-09-26 NOTE — Telephone Encounter (Signed)
Pt needs to reschedule 24mw, pft, and ov scheduled for 10/01/15 with JN.  Pt wishes to reschedule this to a day in October, any day/time will work but pt cannot do Thursdays. I attempted to reschedule with pt while I had her on the phone, but was advised that she could not stay on the phone to do this at this time, requested we reschedule and leave appts on vm.

## 2015-09-26 NOTE — Telephone Encounter (Signed)
LVM for pt to return call

## 2015-10-01 ENCOUNTER — Ambulatory Visit: Payer: Commercial Managed Care - HMO | Admitting: Pulmonary Disease

## 2015-10-01 ENCOUNTER — Ambulatory Visit: Payer: Commercial Managed Care - HMO

## 2015-10-02 ENCOUNTER — Ambulatory Visit (INDEPENDENT_AMBULATORY_CARE_PROVIDER_SITE_OTHER): Payer: Commercial Managed Care - HMO | Admitting: Family Medicine

## 2015-10-02 ENCOUNTER — Ambulatory Visit (INDEPENDENT_AMBULATORY_CARE_PROVIDER_SITE_OTHER)
Admission: RE | Admit: 2015-10-02 | Discharge: 2015-10-02 | Disposition: A | Discharge status: Home / Self Care | Payer: Commercial Managed Care - HMO | Source: Ambulatory Visit | Attending: Family Medicine | Admitting: Family Medicine

## 2015-10-02 ENCOUNTER — Encounter: Payer: Self-pay | Admitting: Family Medicine

## 2015-10-02 VITALS — BP 132/68 | HR 82 | Temp 98.0°F | Resp 17 | Ht 60.0 in | Wt 151.1 lb

## 2015-10-02 DIAGNOSIS — F411 Generalized anxiety disorder: Secondary | ICD-10-CM

## 2015-10-02 DIAGNOSIS — M79672 Pain in left foot: Secondary | ICD-10-CM

## 2015-10-02 DIAGNOSIS — J0100 Acute maxillary sinusitis, unspecified: Secondary | ICD-10-CM | POA: Diagnosis not present

## 2015-10-02 DIAGNOSIS — M7732 Calcaneal spur, left foot: Secondary | ICD-10-CM | POA: Diagnosis not present

## 2015-10-02 MED ORDER — HYDROCODONE-ACETAMINOPHEN 5-325 MG PO TABS: 1.0000 | ORAL_TABLET | Freq: Four times a day (QID) | ORAL | 0 refills | Status: DC | PRN

## 2015-10-02 MED ORDER — CLONAZEPAM 0.5 MG PO TABS: 0.5000 mg | ORAL_TABLET | Freq: Two times a day (BID) | ORAL | 1 refills | Status: DC | PRN

## 2015-10-02 MED ORDER — GUAIFENESIN-CODEINE 100-10 MG/5ML PO SYRP: 10.0000 mL | ORAL_SOLUTION | Freq: Three times a day (TID) | ORAL | 0 refills | Status: DC | PRN

## 2015-10-02 MED ORDER — AMOXICILLIN 875 MG PO TABS: 875.0000 mg | ORAL_TABLET | Freq: Two times a day (BID) | ORAL | 0 refills | Status: DC

## 2015-10-02 NOTE — Progress Notes (Signed)
   Subjective:    Patient ID: Sandra [name], female    DOB: 07-30-1933, 82 y.o.   MRN: [id number]  HPI URI- pt's granddaughter recently hospitalized w/ PNA.  Pt is now coughing- intermittently productive.  + nasal congestion.  Denies sinus pain/pressure.  Denies fever.  sxs started ~1 week ago.  'i've been in the bed'.  No ear pain.  + sore throat.  No tooth pain.  No N/V.  DM- pt's insulin changed to Novolin N 34 units BID and Humulin R 17 units TID.  Eye exam 7/10 w/o Retinopathy.  L foot pain- pt fell ~2 weeks ago.  Went to [facility] 9/5 and was dx'd w/ tendonitis and started on 3 days of steroids.  Pt now w/ pain and redness over lateral aspect of foot.  + bony protrusion, very TTP.  Anxiety- pt is asking for Clonazepam to be sent to mail order  Review of Systems For ROS see HPI     Objective:   Physical Exam  Constitutional: She appears well-developed and well-nourished. No distress.  HENT:  Head: Normocephalic and atraumatic.  Right Ear: Tympanic membrane normal.  Left Ear: Tympanic membrane normal.  Nose: Mucosal edema and rhinorrhea present. Right sinus exhibits maxillary sinus tenderness. Right sinus exhibits no frontal sinus tenderness. Left sinus exhibits maxillary sinus tenderness. Left sinus exhibits no frontal sinus tenderness.  Mouth/Throat: Uvula is midline and mucous membranes are normal. Posterior oropharyngeal erythema present. No oropharyngeal exudate.  Eyes: Conjunctivae and EOM are normal. Pupils are equal, round, and reactive to light.  Neck: Normal range of motion. Neck supple.  Cardiovascular: Normal rate, regular rhythm and normal heart sounds.   Pulmonary/Chest: Effort normal and breath sounds normal. No respiratory distress. She has no wheezes.  Dry cough during visit  Musculoskeletal: She exhibits tenderness (over L 5th metatarsal) and deformity (bony protrusion at base of L 5th metatarsal). She exhibits no edema.  Lymphadenopathy:    She has no cervical  adenopathy.  Skin: Skin is warm and dry. There is erythema (mild erythema over bony protrusion at base of L 5th metatarsal).  Vitals reviewed.         Assessment & Plan:  Sinusitis- new.  Pt's sxs and PE consistent w/ infxn.  Given recent sick contacts, will start abx.  Cough meds prn.  Reviewed supportive care and red flags that should prompt return.  Pt expressed understanding and is in agreement w/ plan.   L foot pain- new.  Pt reports a fall 2 weeks ago.  Was seen at [facility] and dx'd w/ tendonitis and prescribed prednisone but no imaging was done.  Pt reports new bony deformity at base of 5th metatarsal but this looks more degenerative than acute.  Get xray to assess.  Pt is asking for refill on pain meds- refill provided.  Anxiety- Clonazepam sent to mail order.

## 2015-10-02 NOTE — Patient Instructions (Signed)
Follow up as needed Go to [facility] and get your xray done Once we get your xray results we will determine the next steps Start the Amoxicillin twice daily- take w/ food- for the sinus infection Use the Codeine cough syrup as needed- will cause drowsiness Use the Hydrocodone as needed for pain We are faxing the Clonazepam script to your mail order pharmacy Call with any questions or concerns Hang in there!!!

## 2015-10-02 NOTE — Progress Notes (Signed)
Pre visit review using our clinic review tool, if applicable. No additional management support is needed unless otherwise documented below in the visit note. 

## 2015-10-09 ENCOUNTER — Ambulatory Visit: Payer: Commercial Managed Care - HMO | Admitting: Family Medicine

## 2015-10-09 ENCOUNTER — Telehealth: Payer: Self-pay | Admitting: Family Medicine

## 2015-10-09 DIAGNOSIS — E1165 Type 2 diabetes mellitus with hyperglycemia: Secondary | ICD-10-CM

## 2015-10-09 DIAGNOSIS — Z794 Long term (current) use of insulin: Principal | ICD-10-CM

## 2015-10-09 DIAGNOSIS — E119 Type 2 diabetes mellitus without complications: Secondary | ICD-10-CM

## 2015-10-09 NOTE — Telephone Encounter (Signed)
Pt has an appt with [facility] on 9/22 @ 1:00 to see Dr Melton Alar and due to having [employer] she needs a referral. Diagnosis code E11.9

## 2015-10-09 NOTE — Telephone Encounter (Signed)
Referral placed today

## 2015-10-11 DIAGNOSIS — N183 Chronic kidney disease, stage 3 (moderate): Secondary | ICD-10-CM | POA: Diagnosis not present

## 2015-10-11 DIAGNOSIS — E1159 Type 2 diabetes mellitus with other circulatory complications: Secondary | ICD-10-CM | POA: Diagnosis not present

## 2015-10-11 DIAGNOSIS — Z6829 Body mass index (BMI) 29.0-29.9, adult: Secondary | ICD-10-CM | POA: Diagnosis not present

## 2015-10-11 DIAGNOSIS — E559 Vitamin D deficiency, unspecified: Secondary | ICD-10-CM | POA: Diagnosis not present

## 2015-10-11 DIAGNOSIS — E785 Hyperlipidemia, unspecified: Secondary | ICD-10-CM | POA: Diagnosis not present

## 2015-10-11 DIAGNOSIS — E1122 Type 2 diabetes mellitus with diabetic chronic kidney disease: Secondary | ICD-10-CM | POA: Diagnosis not present

## 2015-10-11 DIAGNOSIS — E1169 Type 2 diabetes mellitus with other specified complication: Secondary | ICD-10-CM | POA: Diagnosis not present

## 2015-10-11 DIAGNOSIS — Z7982 Long term (current) use of aspirin: Secondary | ICD-10-CM | POA: Diagnosis not present

## 2015-10-11 DIAGNOSIS — E1165 Type 2 diabetes mellitus with hyperglycemia: Secondary | ICD-10-CM | POA: Diagnosis not present

## 2015-10-11 DIAGNOSIS — E663 Overweight: Secondary | ICD-10-CM | POA: Diagnosis not present

## 2015-10-11 DIAGNOSIS — Z794 Long term (current) use of insulin: Secondary | ICD-10-CM | POA: Diagnosis not present

## 2015-10-11 DIAGNOSIS — I129 Hypertensive chronic kidney disease with stage 1 through stage 4 chronic kidney disease, or unspecified chronic kidney disease: Secondary | ICD-10-CM | POA: Diagnosis not present

## 2015-10-11 LAB — HEMOGLOBIN A1C: Hemoglobin A1C: 8.7

## 2015-10-15 ENCOUNTER — Encounter: Payer: Self-pay | Admitting: General Practice

## 2015-10-21 ENCOUNTER — Ambulatory Visit: Payer: Commercial Managed Care - HMO | Admitting: Pulmonary Disease

## 2015-10-21 ENCOUNTER — Ambulatory Visit: Payer: Commercial Managed Care - HMO

## 2015-10-22 ENCOUNTER — Ambulatory Visit: Payer: Commercial Managed Care - HMO

## 2015-10-22 ENCOUNTER — Ambulatory Visit: Payer: Commercial Managed Care - HMO | Admitting: Pulmonary Disease

## 2015-10-25 ENCOUNTER — Encounter: Payer: Self-pay | Admitting: Pulmonary Disease

## 2015-10-25 ENCOUNTER — Ambulatory Visit (INDEPENDENT_AMBULATORY_CARE_PROVIDER_SITE_OTHER): Payer: Commercial Managed Care - HMO | Admitting: Pulmonary Disease

## 2015-10-25 VITALS — BP 118/64 | HR 91 | Ht 60.0 in | Wt 151.0 lb

## 2015-10-25 DIAGNOSIS — J019 Acute sinusitis, unspecified: Secondary | ICD-10-CM

## 2015-10-25 DIAGNOSIS — J849 Interstitial pulmonary disease, unspecified: Secondary | ICD-10-CM

## 2015-10-25 DIAGNOSIS — IMO0001 Reserved for inherently not codable concepts without codable children: Secondary | ICD-10-CM

## 2015-10-25 DIAGNOSIS — R911 Solitary pulmonary nodule: Secondary | ICD-10-CM | POA: Diagnosis not present

## 2015-10-25 DIAGNOSIS — R0600 Dyspnea, unspecified: Secondary | ICD-10-CM

## 2015-10-25 DIAGNOSIS — K219 Gastro-esophageal reflux disease without esophagitis: Secondary | ICD-10-CM | POA: Diagnosis not present

## 2015-10-25 LAB — PULMONARY FUNCTION TEST
FEF 25-75 POST: 1.98 L/s
FEF 25-75 Pre: 1.74 L/sec
FEF2575-%Change-Post: 13 %
FEF2575-%PRED-PRE: 144 %
FEF2575-%Pred-Post: 165 %
FEV1-%Change-Post: 9 %
FEV1-%PRED-PRE: 79 %
FEV1-%Pred-Post: 86 %
FEV1-PRE: 1.25 L
FEV1-Post: 1.37 L
FEV1FVC-%Change-Post: 0 %
FEV1FVC-%Pred-Pre: 117 %
FEV6-%CHANGE-POST: 9 %
FEV6-%PRED-PRE: 71 %
FEV6-%Pred-Post: 78 %
FEV6-POST: 1.59 L
FEV6-Pre: 1.44 L
FEV6FVC-%PRED-POST: 106 %
FEV6FVC-%Pred-Pre: 106 %
FVC-%Change-Post: 10 %
FVC-%PRED-POST: 74 %
FVC-%Pred-Pre: 67 %
FVC-Post: 1.59 L
FVC-Pre: 1.44 L
POST FEV6/FVC RATIO: 100 %
PRE FEV1/FVC RATIO: 87 %
Post FEV1/FVC ratio: 86 %
Pre FEV6/FVC Ratio: 100 %

## 2015-10-25 MED ORDER — AZITHROMYCIN 250 MG PO TABS: ORAL_TABLET | ORAL | 0 refills | Status: DC

## 2015-10-25 NOTE — Patient Instructions (Addendum)
   Continue taking your Zantac daily.  Call me if you have any new breathing problems before your next appointment.  I will see you back in 3 months or sooner if needed.  TESTS ORDERED: 1. Spirometry with DLCO at next appointment 2. 6MWT on room air at next appointment 3. CT Chest w/o November 201

## 2015-10-25 NOTE — Progress Notes (Signed)
Subjective:    Patient ID: Sandra [name], female    DOB: [birth year], 82 y.o.   MRN: [id number]  C.C.:  Follow-up for ILD, Lingula Nodule, & GERD.  HPI ILD:  Possibly secondary to Scleroderma. She reports she has had a cough productive of a green mucus. Reports her breathing is doing ok. She is wheezing intermittently. Not currently on immunosuppression. She was on Prednisone for 3 days for a foot injury without any change in her breathing.   Lingula Nodule: 63m in inferior segment. Seen on high-resolution CT imaging May 2017.  GERD:  On Zantac. No reflux or dyspepsia. No morning brash water taste.   Review of Systems She reports she has no sinus pressure or pain but does feel some drainage. No fever or sweats. Is having some chills. Did get treated with Amoxicillin. No chest pain or tightness.   Allergies  Allergen Reactions  . Cephalexin Nausea And Vomiting    Pt stated made severely sick, will never take again  . Pioglitazone     REACTION: EDEMA    Current Outpatient Prescriptions on File Prior to Visit  Medication Sig Dispense Refill  . aspirin 81 MG tablet Take 81 mg by mouth daily.    .Marland Kitchenatorvastatin (LIPITOR) 20 MG tablet Take 20 mg by mouth daily.    . Calcium-Magnesium-Vitamin D (CALCIUM 1200+D3 PO) Take 1 tablet by mouth daily. Vitamin d is 1000iu    . clonazePAM (KLONOPIN) 0.5 MG tablet Take 1 tablet (0.5 mg total) by mouth 2 (two) times daily as needed for anxiety. 90 tablet 1  . Cyanocobalamin (VITAMIN B-12 PO) Take by mouth daily.    . fenofibrate 160 MG tablet Take 1 tablet (160 mg total) by mouth daily. 90 tablet 1  . glimepiride (AMARYL) 4 MG tablet Take 2 mg by mouth 2 (two) times daily.   2  . guaiFENesin-codeine (ROBITUSSIN AC) 100-10 MG/5ML syrup Take 10 mLs by mouth 3 (three) times daily as needed for cough. 240 mL 0  . HYDROcodone-acetaminophen (NORCO/VICODIN) 5-325 MG tablet Take 1 tablet by mouth every 6 (six) hours as needed for moderate pain. 45 tablet 0    . insulin aspart (NOVOLOG FLEXPEN) 100 UNIT/ML FlexPen Inject 15 Units into the skin 3 (three) times daily with meals.    . Insulin Glargine (TOUJEO SOLOSTAR) 300 UNIT/ML SOPN Inject 60 Units into the skin.     . Insulin Syringe-Needle U-100 (INSULIN SYRINGE .5CC/31GX5/16") 31G X 5/16" 0.5 ML MISC Use to inject insulin daily    . lisinopril (PRINIVIL,ZESTRIL) 10 MG tablet Take 10 mg by mouth daily.     . metoprolol succinate (TOPROL-XL) 50 MG 24 hr tablet TAKE 1 TABLET BY MOUTH DAILY. TAKE WITH OR IMMEDIATELY FOLLOWING A MEAL 90 tablet 1  . Omega-3 Fatty Acids (FISH OIL PO) Take by mouth daily.    . ranitidine (ZANTAC) 150 MG tablet TAKE 1 TABLET AT BEDTIME 90 tablet 1   No current facility-administered medications on file prior to visit.     Past Medical History:  Diagnosis Date  . Acid reflux   . Acute MI   . Allergic rhinitis   . Anxiety   . Degenerative joint disease   . Depression   . Diabetes mellitus    type 2  . Gait disturbance   . GERD (gastroesophageal reflux disease)   . Hyperlipidemia   . Hyperlipidemia   . Hypertension   . Neck pain, chronic   . Osteopenia   . SAH (subarachnoid  hemorrhage) ([place])   . Sciatica   . Takotsubo cardiomyopathy    with subsequent normalization of left ventricular systolic function  . TIA (transient ischemic attack)   . Valvular heart disease     Past Surgical History:  Procedure Laterality Date  . ABDOMINAL HYSTERECTOMY  1977   no oophorectomy  . APPENDECTOMY    . CARDIAC CATHETERIZATION    . CATARACT EXTRACTION, BILATERAL  2011  . SPINAL FUSION  2000   Dr. Vertell Limber  . TONSILLECTOMY      Family History  Problem Relation Age of Onset  . Intracerebral hemorrhage Daughter     assoc with post-partum  . Diabetes Sister   . Asthma Sister   . Diabetes Sister   . Breast cancer      ?Aunt  . Cancer      breast?  . Coronary artery disease Neg Hx     Social History   Social History  . Marital status: Divorced    Spouse  name: N/A  . Number of children: 2  . Years of education: N/A   Occupational History  . Retired     Press photographer   Social History Main Topics  . Smoking status: Passive Smoke Exposure - Never Smoker  . Smokeless tobacco: Never Used     Comment: Mother & Children  . Alcohol use No  . Drug use: No  . Sexual activity: No   Other Topics Concern  . None   Social History Narrative   Lives with her daughter and G-daughter   Diet- working on portion control   Exercise- no routine exercise but active      [facility]:   Originally from [place]. Always lived in [place]. Previously has traveled to [place], [place], [place], [place], & [place]. Previously did accounting and clerical work. Has a dog currently. Remote exposure to a parakeet. No mold exposure.       Objective:   Physical Exam BP 118/64 (BP Location: Left Arm, Cuff Size: Normal)   Pulse 91   Ht 5' (1.524 m)   Wt 151 lb (68.5 kg)   SpO2 95%   BMI 29.49 kg/m  General:  Awake. Alert. Comfortable.  Integument:  Warm & dry. No rash on exposed skin.  Lymphatics:  No appreciated cervical or supraclavicular lymphadenoapthy. HEENT:  Moist mucus membranes. No oral ulcers. No scleral icterus. Moderate bilateral nasal turbinate swelling. No sinus tenderness to palpation. Cardiovascular:  Regular rate. No edema. Normal S1 & S2.  Pulmonary:  Faint crackles right lung baseUnchanged. No accessory muscle use on room air. Speaking in complete sentences. Musculoskeletal:  Normal bulk and tone. No joint effusion. Mild kyphosis.   PFT 10/25/15: FVC 1.44 L (67%) FEV1 1.25 L (79%) FEV1/FVC 0.87 FEF 25-75 1.74 L (144%) negative bronchodilator response 06/24/15: FVC 1.65 L (76%) FEV1 1.39 L (87%) FEV1/FVC 0.84 FEF 25-75 1.57 L (130%) negative bronchodilator response TLC 2.90 L (65%) RV 52% ERV 71% DLCO corrected 70% (hemoglobin 11.7)  6MWT 10/25/15:  Walked 240 meters (after 4:42) / Baseline Sat 96% on RA / Nadir Sat 96% on RA (could not finish walk due to  back pain) 06/24/15: Walked 240 meters / Baseline sat 96% on RA / Nadir Sat 94% on RA  IMAGING HRCT CHEST W/O 05/23/15 (previously reviewed by me): No pathologic mediastinal adenopathy. No pleural effusion or thickening. No pericardial effusion. 9m nodule in inferior lingula. No traction bronchiectasis or honeycomb changes. Patchy areas of ground glass and reticulation peripherally. Some  evidence of air trapping on exhalation.   CT CHEST W/O 05/02/15 (previously reviewed by me): Small hiatal hernia. Borderline mediastinal adenopathy. No pleural effusion or thickening. No pericardial effusion. Patchy bilateral ground glass. No parenchymal nodule.   CARDIAC TTE (08/15/15): LV w/ mild concentric hypertrophy. EF 55-60%. Normal wall motion. Grade 1 diastolic dysfunction. LA & RA normal in size. RV  Normal in size & function. Aortic valve & mitral valve normal. Poorly visualized pulmonic valve. No tricuspid regurg. No pericardial effusion.   TTE (11/28/10): LV normal in size with EF 60%. Cannot exclude regional wall motion abnormalities. LA normal in size. RA upper limits of normal in size. RV normal in size and function.o aortic stenosis or regurgitation. No mitral stenosis or regurgitation.No pulmonic regurgitation. No pericardial effusion.  LABS 05/23/15 CBC: 5.3/12.3/37.4/331 BMP: 140/4.5/108/20/32/1.42/302/9.0 LFT: 4.0/6.7/0.3/52/22/16 UA: SG 1025/Protein Negative/RBC Negative/WBC 0-5/Sqam Epith 0-5  05/10/15 ESR: 25 CRP: 0.6 Anti-CCP: <16 RF: <10 Anti-Jo1: <0.2 Centromere Ab: <0.2 DS DNA Ab: <1 Smith Ab: <0.2 Chromatin Ab: <0.2 RNP Ab: <0.2 SSA: 1.5/1.2 SSB: <0.2/<1.0 SCL-70: 4.1/3.1 Hypersensitivity Pneumonitis Panel: Negative   04/04/15 CBC: 6.8/12.6/36.6/314 BMP: 137/4.3/101/29/23/1.4/247/9.4 LFT: 4.0/7.2/0.4/50/21/19 PRO-BNP: 51    Assessment & Plan:  82 y.o. female with ILD likely secondary to underlying scleroderma. Patient remains off immunosuppression for her  scleroderma. Her spirometry has significantly worsened since previous testing but despite her back pain or walk test distance and oxygenation remained stable. She does seem to have ongoing cough from acute sinusitis which may be confounding her spirometry somewhat. We did discuss her previous high-resolution CT scan from May which did show a 5 mm nodule. Given her family history of malignancy and prior history of passive tobacco smoke exposure I feel that repeating CT imaging is reasonable. I instructed the patient to contact my office if she had any new difficulties with her breathing. I am holding on surgical lung biopsy and immunosuppression at this time.  1. ILD: Holding off on immunosuppression and surgical lung biopsy. Repeat spirometry with DLCO and 6 minute walk test on room air at next appointment.  2. Lingula Nodule:  Repeat CT chest without contrast in November 2017.  3. Acute Sinusitis: Treating with a Z-Pak.  4. GERD: Controlled with Zantac. No changes.  5. Health Maintenance: S/P Prevnar October 2015 & Pneumovax November 2011. Recommended influenza vaccine as she recovers from her acute sinusitis.  6. Follow-up: Return to clinic in 3 months or sooner if needed.  Sonia Baller Ashok Cordia, M.D. [facility] Pager:  [phone] After 3pm or if no response, call [phone] 2:02 PM 10/25/15

## 2015-10-25 NOTE — Progress Notes (Signed)
Test reviewed.  

## 2015-12-03 ENCOUNTER — Other Ambulatory Visit: Payer: Self-pay | Admitting: Family Medicine

## 2015-12-03 ENCOUNTER — Ambulatory Visit (INDEPENDENT_AMBULATORY_CARE_PROVIDER_SITE_OTHER)
Admission: RE | Admit: 2015-12-03 | Discharge: 2015-12-03 | Disposition: A | Discharge status: Home / Self Care | Payer: Commercial Managed Care - HMO | Source: Ambulatory Visit | Attending: Pulmonary Disease | Admitting: Pulmonary Disease

## 2015-12-03 ENCOUNTER — Encounter (INDEPENDENT_AMBULATORY_CARE_PROVIDER_SITE_OTHER): Payer: Self-pay

## 2015-12-03 DIAGNOSIS — IMO0001 Reserved for inherently not codable concepts without codable children: Secondary | ICD-10-CM

## 2015-12-03 DIAGNOSIS — N183 Chronic kidney disease, stage 3 unspecified: Secondary | ICD-10-CM

## 2015-12-03 DIAGNOSIS — R911 Solitary pulmonary nodule: Secondary | ICD-10-CM

## 2015-12-03 DIAGNOSIS — R918 Other nonspecific abnormal finding of lung field: Secondary | ICD-10-CM | POA: Diagnosis not present

## 2015-12-03 DIAGNOSIS — D631 Anemia in chronic kidney disease: Secondary | ICD-10-CM

## 2015-12-06 ENCOUNTER — Encounter: Payer: Self-pay | Admitting: Family Medicine

## 2015-12-06 ENCOUNTER — Ambulatory Visit (INDEPENDENT_AMBULATORY_CARE_PROVIDER_SITE_OTHER): Payer: Commercial Managed Care - HMO | Admitting: Family Medicine

## 2015-12-06 VITALS — BP 118/86 | HR 70 | Temp 97.9°F | Resp 17 | Ht 60.0 in | Wt 157.0 lb

## 2015-12-06 DIAGNOSIS — D631 Anemia in chronic kidney disease: Secondary | ICD-10-CM

## 2015-12-06 DIAGNOSIS — M545 Low back pain: Secondary | ICD-10-CM | POA: Diagnosis not present

## 2015-12-06 DIAGNOSIS — E1169 Type 2 diabetes mellitus with other specified complication: Secondary | ICD-10-CM | POA: Diagnosis not present

## 2015-12-06 DIAGNOSIS — N183 Chronic kidney disease, stage 3 unspecified: Secondary | ICD-10-CM

## 2015-12-06 DIAGNOSIS — E785 Hyperlipidemia, unspecified: Secondary | ICD-10-CM

## 2015-12-06 DIAGNOSIS — K7689 Other specified diseases of liver: Secondary | ICD-10-CM

## 2015-12-06 DIAGNOSIS — R7989 Other specified abnormal findings of blood chemistry: Secondary | ICD-10-CM

## 2015-12-06 DIAGNOSIS — G8929 Other chronic pain: Secondary | ICD-10-CM

## 2015-12-06 HISTORY — DX: Other specified diseases of liver: K76.89

## 2015-12-06 LAB — FERRITIN: FERRITIN: 222.8 ng/mL (ref 10.0–291.0)

## 2015-12-06 LAB — CBC WITH DIFFERENTIAL/PLATELET
BASOS ABS: 0 10*3/uL (ref 0.0–0.1)
Basophils Relative: 0.5 % (ref 0.0–3.0)
Eosinophils Absolute: 0.1 10*3/uL (ref 0.0–0.7)
Eosinophils Relative: 2.4 % (ref 0.0–5.0)
HEMATOCRIT: 33.6 % — AB (ref 36.0–46.0)
HEMOGLOBIN: 11.6 g/dL — AB (ref 12.0–15.0)
LYMPHS PCT: 48.5 % — AB (ref 12.0–46.0)
Lymphs Abs: 2.1 10*3/uL (ref 0.7–4.0)
MCHC: 34.7 g/dL (ref 30.0–36.0)
MCV: 95.1 fl (ref 78.0–100.0)
MONOS PCT: 7.9 % (ref 3.0–12.0)
Monocytes Absolute: 0.3 10*3/uL (ref 0.1–1.0)
Neutro Abs: 1.8 10*3/uL (ref 1.4–7.7)
Neutrophils Relative %: 40.7 % — ABNORMAL LOW (ref 43.0–77.0)
Platelets: 276 10*3/uL (ref 150.0–400.0)
RBC: 3.53 Mil/uL — AB (ref 3.87–5.11)
RDW: 12.7 % (ref 11.5–15.5)
WBC: 4.3 10*3/uL (ref 4.0–10.5)

## 2015-12-06 LAB — URINALYSIS, ROUTINE W REFLEX MICROSCOPIC
Bilirubin Urine: NEGATIVE
HGB URINE DIPSTICK: NEGATIVE
KETONES UR: NEGATIVE
Leukocytes, UA: NEGATIVE
NITRITE: NEGATIVE
RBC / HPF: NONE SEEN (ref 0–?)
Specific Gravity, Urine: 1.015 (ref 1.000–1.030)
TOTAL PROTEIN, URINE-UPE24: NEGATIVE
URINE GLUCOSE: 250 — AB
UROBILINOGEN UA: 0.2 (ref 0.0–1.0)
pH: 5.5 (ref 5.0–8.0)

## 2015-12-06 LAB — LIPID PANEL
CHOL/HDL RATIO: 8
Cholesterol: 189 mg/dL (ref 0–200)
HDL: 23.1 mg/dL — ABNORMAL LOW (ref >39.00)
NONHDL: 166.15
Triglycerides: 304 mg/dL — ABNORMAL HIGH (ref 0.0–149.0)
VLDL: 60.8 mg/dL — ABNORMAL HIGH (ref 0.0–40.0)

## 2015-12-06 LAB — PHOSPHORUS: Phosphorus: 3.5 mg/dL (ref 2.3–4.6)

## 2015-12-06 LAB — LDL CHOLESTEROL, DIRECT: LDL DIRECT: 131 mg/dL

## 2015-12-06 NOTE — Assessment & Plan Note (Signed)
Chronic problem.  Referral to Dr C placed for pt to return for follow up

## 2015-12-06 NOTE — Assessment & Plan Note (Signed)
Chronic problem.  Tolerating statin w/o difficulty.  Check labs.  Adjust meds prn  

## 2015-12-06 NOTE — Progress Notes (Signed)
Pre visit review using our clinic review tool, if applicable. No additional management support is needed unless otherwise documented below in the visit note. 

## 2015-12-06 NOTE — Patient Instructions (Signed)
Schedule your complete physical for after March We'll notify you of your lab results and fax those to Dr C I put in the referral for Dr Nelva Bush and Dr C Call with any questions or concerns Elbert Ewings in there! Happy Holidays!!!

## 2015-12-06 NOTE — Progress Notes (Signed)
   Subjective:    Patient ID: Sandra [name], female    DOB: [birth year], 82 y.o.   MRN: [id number]  HPI CT results- pt is seeing Dr Ashok Cordia and was upset about how the results of a nodule were presented to her.  Pt was under the impression that the nodule was in the her L lung and it is actually in the left lobe of the liver.  According to CT scan on 05/02/15 the 1 cm liver lesion is statistically likely to be a cyst.  She said that the results were presented as, 'do you want an operation, radiation, or chemo'.    Hyperlipidemia- chronic problem, on Lipitor 20mg  and Fenofibrate 160mg  daily.  Denies abd pain, N/V.  Chronic back pain- pt needs referral back to Dr Nelva Bush for a repeat injxn  Review of Systems For ROS see HPI     Objective:   Physical Exam  Constitutional: She is oriented to person, place, and time. She appears well-developed and well-nourished. No distress.  overweight  HENT:  Head: Normocephalic and atraumatic.  Eyes: Conjunctivae and EOM are normal. Pupils are equal, round, and reactive to light.  Neck: Normal range of motion. Neck supple. No thyromegaly present.  Cardiovascular: Normal rate, regular rhythm and normal heart sounds.   Pulmonary/Chest: Effort normal and breath sounds normal. No respiratory distress.  Abdominal: Soft. She exhibits no distension. There is no tenderness.  Musculoskeletal: She exhibits no edema.  Lymphadenopathy:    She has no cervical adenopathy.  Neurological: She is alert and oriented to person, place, and time.  Skin: Skin is warm and dry.  Psychiatric: Her behavior is normal.  Anxious, tearful when discussing CT results  Vitals reviewed.         Assessment & Plan:

## 2015-12-06 NOTE — Assessment & Plan Note (Signed)
New.  Reviewed CT scan from 4/13 and 11/14 and imaging stated that the nodule is not in the lung but rather the liver.  This is stable over 7 months and no additional work up needed at this time.  Pt to f/u w/ Dr Ashok Cordia for the interstitial lung disease and she is aware that there is no nodule in the lungs

## 2015-12-06 NOTE — Assessment & Plan Note (Signed)
Chronic problem.  Seeing Dr Nelva Bush.  Referral placed for a return visit

## 2015-12-06 NOTE — Addendum Note (Signed)
Addended by: Katina Dung on: 12/06/2015 01:50 PM   Modules accepted: Orders

## 2015-12-07 LAB — COMPLETE METABOLIC PANEL WITH GFR
ALBUMIN: 3.8 g/dL (ref 3.6–5.1)
ALT: 15 U/L (ref 6–29)
AST: 22 U/L (ref 10–35)
Alkaline Phosphatase: 52 U/L (ref 33–130)
BILIRUBIN TOTAL: 0.3 mg/dL (ref 0.2–1.2)
BUN: 28 mg/dL — ABNORMAL HIGH (ref 7–25)
CALCIUM: 8.8 mg/dL (ref 8.6–10.4)
CO2: 24 mmol/L (ref 20–31)
CREATININE: 1.49 mg/dL — AB (ref 0.60–0.93)
Chloride: 105 mmol/L (ref 98–110)
GFR, EST AFRICAN AMERICAN: 38 mL/min — AB (ref >=60)
GFR, Est Non African American: 33 mL/min — ABNORMAL LOW (ref >=60)
Glucose, Bld: 182 mg/dL — ABNORMAL HIGH (ref 65–99)
Potassium: 4.4 mmol/L (ref 3.5–5.3)
Sodium: 138 mmol/L (ref 135–146)
TOTAL PROTEIN: 6.5 g/dL (ref 6.1–8.1)

## 2015-12-07 LAB — IRON AND TIBC
%SAT: 34 % (ref 11–50)
Iron: 137 ug/dL (ref 45–160)
TIBC: 399 ug/dL (ref 250–450)
UIBC: 262 ug/dL (ref 125–400)

## 2015-12-10 ENCOUNTER — Other Ambulatory Visit: Payer: Self-pay | Admitting: Family Medicine

## 2015-12-17 ENCOUNTER — Telehealth: Payer: Self-pay | Admitting: Pulmonary Disease

## 2015-12-17 NOTE — Telephone Encounter (Signed)
Pt requesting last 3 CT scans to be mailed (verified address). I have placed those in outgoing mail. Pt aware & voiced understanding

## 2015-12-18 DIAGNOSIS — J8489 Other specified interstitial pulmonary diseases: Secondary | ICD-10-CM | POA: Diagnosis not present

## 2015-12-18 DIAGNOSIS — N2581 Secondary hyperparathyroidism of renal origin: Secondary | ICD-10-CM | POA: Diagnosis not present

## 2015-12-18 DIAGNOSIS — I10 Essential (primary) hypertension: Secondary | ICD-10-CM | POA: Diagnosis not present

## 2015-12-18 DIAGNOSIS — N183 Chronic kidney disease, stage 3 (moderate): Secondary | ICD-10-CM | POA: Diagnosis not present

## 2015-12-18 DIAGNOSIS — E785 Hyperlipidemia, unspecified: Secondary | ICD-10-CM | POA: Diagnosis not present

## 2015-12-18 DIAGNOSIS — D631 Anemia in chronic kidney disease: Secondary | ICD-10-CM | POA: Diagnosis not present

## 2015-12-18 DIAGNOSIS — I5181 Takotsubo syndrome: Secondary | ICD-10-CM | POA: Diagnosis not present

## 2015-12-18 DIAGNOSIS — E1122 Type 2 diabetes mellitus with diabetic chronic kidney disease: Secondary | ICD-10-CM | POA: Diagnosis not present

## 2015-12-18 DIAGNOSIS — M358 Other specified systemic involvement of connective tissue: Secondary | ICD-10-CM | POA: Diagnosis not present

## 2015-12-23 ENCOUNTER — Other Ambulatory Visit: Payer: Self-pay | Admitting: Family Medicine

## 2016-01-09 DIAGNOSIS — M5416 Radiculopathy, lumbar region: Secondary | ICD-10-CM | POA: Diagnosis not present

## 2016-01-09 DIAGNOSIS — G8929 Other chronic pain: Secondary | ICD-10-CM | POA: Diagnosis not present

## 2016-01-09 DIAGNOSIS — M5441 Lumbago with sciatica, right side: Secondary | ICD-10-CM | POA: Diagnosis not present

## 2016-01-09 DIAGNOSIS — M48061 Spinal stenosis, lumbar region without neurogenic claudication: Secondary | ICD-10-CM | POA: Diagnosis not present

## 2016-01-10 ENCOUNTER — Encounter: Payer: Self-pay | Admitting: Physician Assistant

## 2016-01-10 ENCOUNTER — Ambulatory Visit (INDEPENDENT_AMBULATORY_CARE_PROVIDER_SITE_OTHER): Payer: Commercial Managed Care - HMO | Admitting: Physician Assistant

## 2016-01-10 VITALS — BP 108/58 | HR 77 | Temp 97.9°F | Resp 16 | Ht 60.0 in | Wt 155.0 lb

## 2016-01-10 DIAGNOSIS — B9689 Other specified bacterial agents as the cause of diseases classified elsewhere: Secondary | ICD-10-CM | POA: Diagnosis not present

## 2016-01-10 DIAGNOSIS — J208 Acute bronchitis due to other specified organisms: Secondary | ICD-10-CM | POA: Diagnosis not present

## 2016-01-10 MED ORDER — DOXYCYCLINE HYCLATE 100 MG PO CAPS: 100.0000 mg | ORAL_CAPSULE | Freq: Two times a day (BID) | ORAL | 0 refills | Status: DC

## 2016-01-10 MED ORDER — ALBUTEROL SULFATE HFA 108 (90 BASE) MCG/ACT IN AERS: 2.0000 | INHALATION_SPRAY | Freq: Four times a day (QID) | RESPIRATORY_TRACT | 2 refills | Status: DC | PRN

## 2016-01-10 MED ORDER — GUAIFENESIN-CODEINE 100-10 MG/5ML PO SYRP: 10.0000 mL | ORAL_SOLUTION | Freq: Three times a day (TID) | ORAL | 0 refills | Status: DC | PRN

## 2016-01-10 NOTE — Progress Notes (Signed)
Patient presents to clinic today c/o 1 week of cough that is now productive or yellow sputum over the past couple of days. Endorses wheezing and chest tightness. Denies fever, chills, aches. Denies chest pain. Patient with history of interstitial lung disease 2/2 scleroderma. Is followed by Pulmonology and Rheumatology. Has not taken anything for symptoms.    Past Medical History:  Diagnosis Date  . Acid reflux   . Acute MI   . Allergic rhinitis   . Anxiety   . Degenerative joint disease   . Depression   . Diabetes mellitus    type 2  . Gait disturbance   . GERD (gastroesophageal reflux disease)   . Hyperlipidemia   . Hyperlipidemia   . Hypertension   . Neck pain, chronic   . Osteopenia   . SAH (subarachnoid hemorrhage) ([place])   . Sciatica   . Takotsubo cardiomyopathy    with subsequent normalization of left ventricular systolic function  . TIA (transient ischemic attack)   . Valvular heart disease     Current Outpatient Prescriptions on File Prior to Visit  Medication Sig Dispense Refill  . aspirin 81 MG tablet Take 81 mg by mouth daily.    Marland Kitchen atorvastatin (LIPITOR) 20 MG tablet TAKE 1 TABLET EVERY DAY 90 tablet 1  . Calcium-Magnesium-Vitamin D (CALCIUM 1200+D3 PO) Take 1 tablet by mouth daily. Vitamin d is 1000iu    . clonazePAM (KLONOPIN) 0.5 MG tablet Take 1 tablet (0.5 mg total) by mouth 2 (two) times daily as needed for anxiety. 90 tablet 1  . Cyanocobalamin (VITAMIN B-12 PO) Take by mouth daily.    . fenofibrate 160 MG tablet TAKE 1 TABLET EVERY DAY 90 tablet 1  . glimepiride (AMARYL) 4 MG tablet Take 2 mg by mouth 2 (two) times daily.   2  . HYDROcodone-acetaminophen (NORCO/VICODIN) 5-325 MG tablet Take 1 tablet by mouth every 6 (six) hours as needed for moderate pain. 45 tablet 0  . insulin regular (NOVOLIN R,HUMULIN R) 250 units/2.53mL (100 units/mL) injection Inject 20 units no more than 30 minute before meals three times daily.    . Insulin Syringe-Needle U-100  (INSULIN SYRINGE .5CC/31GX5/16") 31G X 5/16" 0.5 ML MISC Use to inject insulin daily    . lisinopril (PRINIVIL,ZESTRIL) 10 MG tablet Take 10 mg by mouth daily.     . metoprolol succinate (TOPROL-XL) 50 MG 24 hr tablet TAKE 1 TABLET BY MOUTH DAILY. TAKE WITH OR IMMEDIATELY FOLLOWING A MEAL 90 tablet 1  . NOVOLIN N RELION 100 UNIT/ML injection Inject 38 Units into the skin every 12 (twelve) hours.     . Omega-3 Fatty Acids (FISH OIL PO) Take by mouth daily.    . ranitidine (ZANTAC) 150 MG tablet TAKE 1 TABLET AT BEDTIME 90 tablet 1   No current facility-administered medications on file prior to visit.     Allergies  Allergen Reactions  . Cephalexin Nausea And Vomiting    Pt stated made severely sick, will never take again  . Pioglitazone     REACTION: EDEMA    Family History  Problem Relation Age of Onset  . Intracerebral hemorrhage Daughter     assoc with post-partum  . Diabetes Sister   . Asthma Sister   . Diabetes Sister   . Breast cancer      ?Aunt  . Cancer      breast?  . Coronary artery disease Neg Hx     Social History   Social History  . Marital  status: Divorced    Spouse name: N/A  . Number of children: 2  . Years of education: N/A   Occupational History  . Retired     Press photographer   Social History Main Topics  . Smoking status: Passive Smoke Exposure - Never Smoker  . Smokeless tobacco: Never Used     Comment: Mother & Children  . Alcohol use No  . Drug use: No  . Sexual activity: No   Other Topics Concern  . None   Social History Narrative   Lives with her daughter and G-daughter   Diet- working on portion control   Exercise- no routine exercise but active      [facility]:   Originally from [place]. Always lived in [place]. Previously has traveled to [place], [place], [place], [place], & [place]. Previously did accounting and clerical work. Has a dog currently. Remote exposure to a parakeet. No mold exposure.     Review of Systems - See HPI.  All other ROS  are negative.  BP (!) 108/58   Pulse 77   Temp 97.9 F (36.6 C) (Oral)   Resp 16   Ht 5' (1.524 m)   Wt 155 lb (70.3 kg)   SpO2 94%   BMI 30.27 kg/m   Physical Exam  Constitutional: She is oriented to person, place, and time and well-developed, well-nourished, and in no distress.  HENT:  Head: Normocephalic and atraumatic.  Right Ear: External ear normal.  Left Ear: External ear normal.  Nose: Nose normal.  Mouth/Throat: Oropharynx is clear and moist. No oropharyngeal exudate.  Eyes: Conjunctivae are normal.  Neck: Neck supple.  Cardiovascular: Normal rate, regular rhythm, normal heart sounds and intact distal pulses.   Pulmonary/Chest: Effort normal and breath sounds normal. No respiratory distress. She has no wheezes. She has no rales. She exhibits no tenderness.  Neurological: She is alert and oriented to person, place, and time.  Skin: Skin is warm and dry. No rash noted.  Psychiatric: Affect normal.  Vitals reviewed.   Recent Results (from the past 2160 hour(s))  Pulmonary function test     Status: None (Preliminary result)   Collection Time: 10/25/15 11:00 AM  Result Value Ref Range   FVC-Pre 1.44 L   FVC-%Pred-Pre 67 %   FVC-Post 1.59 L   FVC-%Pred-Post 74 %   FVC-%Change-Post 10 %   FEV1-Pre 1.25 L   FEV1-%Pred-Pre 79 %   FEV1-Post 1.37 L   FEV1-%Pred-Post 86 %   FEV1-%Change-Post 9 %   FEV6-Pre 1.44 L   FEV6-%Pred-Pre 71 %   FEV6-Post 1.59 L   FEV6-%Pred-Post 78 %   FEV6-%Change-Post 9 %   Pre FEV1/FVC ratio 87 %   FEV1FVC-%Pred-Pre 117 %   Post FEV1/FVC ratio 86 %   FEV1FVC-%Change-Post 0 %   Pre FEV6/FVC Ratio 100 %   FEV6FVC-%Pred-Pre 106 %   Post FEV6/FVC ratio 100 %   FEV6FVC-%Pred-Post 106 %   FEF 25-75 Pre 1.74 L/sec   FEF2575-%Pred-Pre 144 %   FEF 25-75 Post 1.98 L/sec   FEF2575-%Pred-Post 165 %   FEF2575-%Change-Post 13 %  Lipid panel     Status: Abnormal   Collection Time: 12/06/15  1:51 PM  Result Value Ref Range   Cholesterol 189  0 - 200 mg/dL    Comment: ATP III Classification       Desirable:  < 200 mg/dL               Borderline High:  200 -  239 mg/dL          High:  > = 240 mg/dL   Triglycerides 304.0 (H) 0.0 - 149.0 mg/dL    Comment: Normal:  <150 mg/dLBorderline High:  150 - 199 mg/dL   HDL 23.10 (L) >39.00 mg/dL   VLDL 60.8 (H) 0.0 - 40.0 mg/dL   Total CHOL/HDL Ratio 8     Comment:                Men          Women1/2 Average Risk     3.4          3.3Average Risk          5.0          4.42X Average Risk          9.6          7.13X Average Risk          15.0          11.0                       NonHDL 166.15     Comment: NOTE:  Non-HDL goal should be 30 mg/dL higher than patient's LDL goal (i.e. LDL goal of < 70 mg/dL, would have non-HDL goal of < 100 mg/dL)  CBC with Differential/Platelet     Status: Abnormal   Collection Time: 12/06/15  1:51 PM  Result Value Ref Range   WBC 4.3 4.0 - 10.5 K/uL   RBC 3.53 (L) 3.87 - 5.11 Mil/uL   Hemoglobin 11.6 (L) 12.0 - 15.0 g/dL   HCT 33.6 (L) 36.0 - 46.0 %   MCV 95.1 78.0 - 100.0 fl   MCHC 34.7 30.0 - 36.0 g/dL   RDW 12.7 11.5 - 15.5 %   Platelets 276.0 150.0 - 400.0 K/uL   Neutrophils Relative % 40.7 (L) 43.0 - 77.0 %   Lymphocytes Relative 48.5 (H) 12.0 - 46.0 %   Monocytes Relative 7.9 3.0 - 12.0 %   Eosinophils Relative 2.4 0.0 - 5.0 %   Basophils Relative 0.5 0.0 - 3.0 %   Neutro Abs 1.8 1.4 - 7.7 K/uL   Lymphs Abs 2.1 0.7 - 4.0 K/uL   Monocytes Absolute 0.3 0.1 - 1.0 K/uL   Eosinophils Absolute 0.1 0.0 - 0.7 K/uL   Basophils Absolute 0.0 0.0 - 0.1 K/uL  Phosphorus     Status: None   Collection Time: 12/06/15  1:51 PM  Result Value Ref Range   Phosphorus 3.5 2.3 - 4.6 mg/dL  Ferritin     Status: None   Collection Time: 12/06/15  1:51 PM  Result Value Ref Range   Ferritin 222.8 10.0 - 291.0 ng/mL  Urinalysis, Routine w reflex microscopic (not at [facility])     Status: Abnormal   Collection Time: 12/06/15  1:51 PM  Result Value Ref Range   Color, Urine  YELLOW Yellow;Lt. Yellow   APPearance CLEAR Clear   Specific Gravity, Urine 1.015 1.000 - 1.030   pH 5.5 5.0 - 8.0   Total Protein, Urine NEGATIVE Negative   Urine Glucose 250 (A) Negative   Ketones, ur NEGATIVE Negative   Bilirubin Urine NEGATIVE Negative   Hgb urine dipstick NEGATIVE Negative   Urobilinogen, UA 0.2 0.0 - 1.0   Leukocytes, UA NEGATIVE Negative   Nitrite NEGATIVE Negative   WBC, UA 0-2/hpf 0-2/hpf   RBC / HPF none seen 0-2/hpf   Squamous Epithelial /  LPF Rare(0-4/hpf) Rare(0-4/hpf)  Iron and TIBC     Status: None   Collection Time: 12/06/15  1:51 PM  Result Value Ref Range   Iron 137 45 - 160 ug/dL   UIBC 262 125 - 400 ug/dL   TIBC 399 250 - 450 ug/dL   %SAT 34 11 - 50 %  COMPLETE METABOLIC PANEL WITH GFR     Status: Abnormal   Collection Time: 12/06/15  1:51 PM  Result Value Ref Range   Sodium 138 135 - 146 mmol/L   Potassium 4.4 3.5 - 5.3 mmol/L   Chloride 105 98 - 110 mmol/L   CO2 24 20 - 31 mmol/L   Glucose, Bld 182 (H) 65 - 99 mg/dL   BUN 28 (H) 7 - 25 mg/dL   Creat 1.49 (H) 0.60 - 0.93 mg/dL    Comment:   For patients > or = 80 years of age: The upper reference limit for Creatinine is approximately 13% higher for people identified as African-American.      Total Bilirubin 0.3 0.2 - 1.2 mg/dL   Alkaline Phosphatase 52 33 - 130 U/L   AST 22 10 - 35 U/L   ALT 15 6 - 29 U/L   Total Protein 6.5 6.1 - 8.1 g/dL   Albumin 3.8 3.6 - 5.1 g/dL   Calcium 8.8 8.6 - 10.4 mg/dL   GFR, Est African American 38 (L) >=60 mL/min   GFR, Est Non African American 33 (L) >=60 mL/min  LDL cholesterol, direct     Status: None   Collection Time: 12/06/15  1:51 PM  Result Value Ref Range   Direct LDL 131.0 mg/dL    Comment: Optimal:  <100 mg/dLNear or Above Optimal:  100-129 mg/dLBorderline High:  130-159 mg/dLHigh:  160-189 mg/dLVery High:  >190 mg/dL   Assessment/Plan: 1. Acute bacterial bronchitis Rx Doxycycline. Rx Albuterol. Supportive measures and OTC  medications reviewed. Will start Rx cough syrup. FU scheduled. Strict return precautions given.   - doxycycline (VIBRAMYCIN) 100 MG capsule; Take 1 capsule (100 mg total) by mouth 2 (two) times daily.  Dispense: 14 capsule; Refill: 0 - albuterol (PROVENTIL HFA;VENTOLIN HFA) 108 (90 Base) MCG/ACT inhaler; Inhale 2 puffs into the lungs every 6 (six) hours as needed for wheezing or shortness of breath.  Dispense: 1 Inhaler; Refill: 2   Leeanne Rio, PA-C

## 2016-01-10 NOTE — Progress Notes (Signed)
Pre visit review using our clinic review tool, if applicable. No additional management support is needed unless otherwise documented below in the visit note. 

## 2016-01-10 NOTE — Patient Instructions (Signed)
Please stay well hydrated and get plenty of rest. Take the antibiotic as directed. Start the albuterol inhaler, using as directed for chest tightness and wheeze.  Place a humidifier in the bedroom.  Follow-up in 7 days. Return sooner if symptoms are not improving.

## 2016-01-11 ENCOUNTER — Emergency Department (HOSPITAL_COMMUNITY)
Admission: EM | Admit: 2016-01-11 | Discharge: 2016-01-11 | Disposition: A | Discharge status: Home / Self Care | Payer: Commercial Managed Care - HMO | Attending: Emergency Medicine | Admitting: Emergency Medicine

## 2016-01-11 ENCOUNTER — Emergency Department (HOSPITAL_COMMUNITY): Payer: Commercial Managed Care - HMO

## 2016-01-11 ENCOUNTER — Encounter (HOSPITAL_COMMUNITY): Payer: Self-pay | Admitting: Certified Nurse Midwife

## 2016-01-11 DIAGNOSIS — R05 Cough: Secondary | ICD-10-CM | POA: Diagnosis not present

## 2016-01-11 DIAGNOSIS — R0602 Shortness of breath: Secondary | ICD-10-CM | POA: Insufficient documentation

## 2016-01-11 DIAGNOSIS — Z7982 Long term (current) use of aspirin: Secondary | ICD-10-CM | POA: Insufficient documentation

## 2016-01-11 DIAGNOSIS — Z8673 Personal history of transient ischemic attack (TIA), and cerebral infarction without residual deficits: Secondary | ICD-10-CM | POA: Diagnosis not present

## 2016-01-11 DIAGNOSIS — I252 Old myocardial infarction: Secondary | ICD-10-CM | POA: Insufficient documentation

## 2016-01-11 DIAGNOSIS — E119 Type 2 diabetes mellitus without complications: Secondary | ICD-10-CM | POA: Diagnosis not present

## 2016-01-11 DIAGNOSIS — I10 Essential (primary) hypertension: Secondary | ICD-10-CM | POA: Diagnosis not present

## 2016-01-11 DIAGNOSIS — Z794 Long term (current) use of insulin: Secondary | ICD-10-CM | POA: Diagnosis not present

## 2016-01-11 DIAGNOSIS — J069 Acute upper respiratory infection, unspecified: Secondary | ICD-10-CM | POA: Diagnosis not present

## 2016-01-11 DIAGNOSIS — J029 Acute pharyngitis, unspecified: Secondary | ICD-10-CM | POA: Diagnosis not present

## 2016-01-11 DIAGNOSIS — B9789 Other viral agents as the cause of diseases classified elsewhere: Secondary | ICD-10-CM

## 2016-01-11 DIAGNOSIS — Z79899 Other long term (current) drug therapy: Secondary | ICD-10-CM | POA: Insufficient documentation

## 2016-01-11 DIAGNOSIS — Z7722 Contact with and (suspected) exposure to environmental tobacco smoke (acute) (chronic): Secondary | ICD-10-CM | POA: Diagnosis not present

## 2016-01-11 LAB — ETHANOL: Alcohol, Ethyl (B): <5 mg/dL (ref <5)

## 2016-01-11 LAB — COMPREHENSIVE METABOLIC PANEL
ALK PHOS: 49 U/L (ref 38–126)
ALT: 24 U/L (ref 14–54)
AST: 42 U/L — ABNORMAL HIGH (ref 15–41)
Albumin: 3.8 g/dL (ref 3.5–5.0)
Anion gap: 7 (ref 5–15)
BUN: 26 mg/dL — ABNORMAL HIGH (ref 6–20)
CALCIUM: 9.4 mg/dL (ref 8.9–10.3)
CO2: 26 mmol/L (ref 22–32)
CREATININE: 1.7 mg/dL — AB (ref 0.44–1.00)
Chloride: 104 mmol/L (ref 101–111)
GFR calc non Af Amer: 27 mL/min — ABNORMAL LOW (ref >60)
GFR, EST AFRICAN AMERICAN: 32 mL/min — AB (ref >60)
GLUCOSE: 164 mg/dL — AB (ref 65–99)
Potassium: 4.6 mmol/L (ref 3.5–5.1)
SODIUM: 137 mmol/L (ref 135–145)
Total Bilirubin: 0.7 mg/dL (ref 0.3–1.2)
Total Protein: 6.8 g/dL (ref 6.5–8.1)

## 2016-01-11 LAB — CBG MONITORING, ED: GLUCOSE-CAPILLARY: 133 mg/dL — AB (ref 65–99)

## 2016-01-11 LAB — RAPID URINE DRUG SCREEN, HOSP PERFORMED
Amphetamines: NOT DETECTED
BARBITURATES: NOT DETECTED
Benzodiazepines: NOT DETECTED
Cocaine: NOT DETECTED
Opiates: NOT DETECTED
TETRAHYDROCANNABINOL: NOT DETECTED

## 2016-01-11 LAB — CBC
HEMATOCRIT: 36.9 % (ref 36.0–46.0)
Hemoglobin: 12.1 g/dL (ref 12.0–15.0)
MCH: 32.6 pg (ref 26.0–34.0)
MCHC: 32.8 g/dL (ref 30.0–36.0)
MCV: 99.5 fL (ref 78.0–100.0)
Platelets: 226 10*3/uL (ref 150–400)
RBC: 3.71 MIL/uL — ABNORMAL LOW (ref 3.87–5.11)
RDW: 13 % (ref 11.5–15.5)
WBC: 4 10*3/uL (ref 4.0–10.5)

## 2016-01-11 LAB — RAPID STREP SCREEN (MED CTR MEBANE ONLY): STREPTOCOCCUS, GROUP A SCREEN (DIRECT): NEGATIVE

## 2016-01-11 LAB — BRAIN NATRIURETIC PEPTIDE: B Natriuretic Peptide: 24.2 pg/mL (ref 0.0–100.0)

## 2016-01-11 LAB — SALICYLATE LEVEL

## 2016-01-11 LAB — ACETAMINOPHEN LEVEL: Acetaminophen (Tylenol), Serum: <10 ug/mL — ABNORMAL LOW (ref 10–30)

## 2016-01-11 MED ORDER — ALBUTEROL (5 MG/ML) CONTINUOUS INHALATION SOLN
10.0000 mg/h | INHALATION_SOLUTION | Freq: Once | RESPIRATORY_TRACT | Status: DC
  Filled 2016-01-11: qty 20

## 2016-01-11 MED ORDER — PREDNISONE 20 MG PO TABS: ORAL_TABLET | ORAL | 0 refills | Status: DC

## 2016-01-11 MED ORDER — IPRATROPIUM BROMIDE 0.02 % IN SOLN
0.5000 mg | Freq: Once | RESPIRATORY_TRACT | Status: AC
  Administered 2016-01-11: 0.5 mg via RESPIRATORY_TRACT
  Filled 2016-01-11: qty 2.5

## 2016-01-11 MED ORDER — ALBUTEROL SULFATE (2.5 MG/3ML) 0.083% IN NEBU
5.0000 mg | INHALATION_SOLUTION | Freq: Once | RESPIRATORY_TRACT | Status: AC
  Administered 2016-01-11: 5 mg via RESPIRATORY_TRACT
  Filled 2016-01-11: qty 6

## 2016-01-11 MED ORDER — BENZONATATE 100 MG PO CAPS: 100.0000 mg | ORAL_CAPSULE | Freq: Three times a day (TID) | ORAL | 0 refills | Status: DC | PRN

## 2016-01-11 MED ORDER — PREDNISONE 20 MG PO TABS
60.0000 mg | ORAL_TABLET | Freq: Once | ORAL | Status: AC
  Administered 2016-01-11: 60 mg via ORAL
  Filled 2016-01-11: qty 3

## 2016-01-11 MED ORDER — SODIUM CHLORIDE 0.9 % IV BOLUS (SEPSIS)
1000.0000 mL | Freq: Once | INTRAVENOUS | Status: AC
  Administered 2016-01-11: 1000 mL via INTRAVENOUS

## 2016-01-11 NOTE — ED Triage Notes (Signed)
Pt states she was at her PCP yesterday and was given an antibx and inhaler for symptoms associated with sore throat and cough. Pt states she is here today because "she can't take it anymore".

## 2016-01-11 NOTE — ED Notes (Signed)
Pt CBG was 133, notified Jessica(RN)

## 2016-01-11 NOTE — ED Provider Notes (Signed)
[facility] DEPT Provider Note   CSN: [id number] Arrival date & time: 01/11/16  4709     History   Chief Complaint Chief Complaint  Patient presents with  . Sore Throat  . Cough  . Suicidal    HPI Sandra [name] is a 82 y.o. female.  HPI   82 year old female with history of anxiety, depression, insulin dependent diabetes, CAD, prior stroke, GERD, scleroderma presenting with cold symptoms. Patient report back in August, her granddaughter was sick with pneumonia and has to be hospitalized. Afterward she begin to develop body aches, chills, congestion, sneezing, coughing, sore throat and wheezing. Her symptoms lasted for about 2 months, got better last month but returned this month and has been ongoing for the past several weeks. She continues to wheeze, having shortness of breath, body aches, and is visibly frustrated of her disease process. She has been seen by multiple specialist during that time. States that she was diagnosed with scleroderma but did not receive any specific treatment. She was seen by her primary care doctor yesterday for her cold symptoms. She was diagnosed with bacterial bronchitis and was discharged with doxycycline, albuterol inhaler but no steroid. She took the medication as prescribed but the symptoms persist prompting her to come here today. Patient states "I just want to die" and voice her frustration of her disease process. She denies any additional stress side from her sickness. She has no specific plan, no homicidal ideation, no auditory or visual hallucination. She is not self-medicating with alcohol or drugs. She is a nonsmoker. No history of asthma or COPD. No history of cancer. Denies any exertional chest pain, hemoptysis, abdominal pain. She does report chronic back pain and currently following up with Dr. Nelva Bush for that.  Past Medical History:  Diagnosis Date  . Acid reflux   . Acute MI   . Allergic rhinitis   . Anxiety   . Degenerative joint  disease   . Depression   . Diabetes mellitus    type 2  . Gait disturbance   . GERD (gastroesophageal reflux disease)   . Hyperlipidemia   . Hyperlipidemia   . Hypertension   . Neck pain, chronic   . Osteopenia   . SAH (subarachnoid hemorrhage) ([place])   . Sciatica   . Takotsubo cardiomyopathy    with subsequent normalization of left ventricular systolic function  . TIA (transient ischemic attack)   . Valvular heart disease     Patient Active Problem List   Diagnosis Date Noted  . Nodule on liver 12/06/2015  . ILD (interstitial lung disease) ([place]) 05/31/2015  . Shortness of breath 04/04/2015  . Type 2 diabetes mellitus with hyperglycemia ([place]) 03/11/2015  . Diabetes mellitus type 2 with retinopathy ([place]) 03/04/2015  . Vitamin D deficiency 10/01/2014  . Leukocytopenia 05/31/2014  . Chronic kidney disease (CKD), stage III (moderate) 05/31/2014  . Fatigue 05/31/2014  . Lower abdominal pain 05/17/2014  . Nausea with vomiting 09/22/2013  . Chronic right SI joint pain 09/22/2013  . 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 Abrasion of ear canal 09/28/2012  . Subarachnoid hemorrhage ([place]) 04/07/2012  . Unspecified cerebral artery occlusion with cerebral infarction 04/07/2012  . UTI (urinary tract infection) 11/02/2011  . General medical examination 01/07/2011  . SAH (subarachnoid hemorrhage) ([place]) 12/03/2010  . Chest pain 11/27/2010  . Achilles tendon injury 11/27/2010  . Back pain 07/01/2010  . Breast pain 04/15/2010  . GAIT DISTURBANCE 08/07/2009  . Anxiety state 07/03/2009  . VALVULAR HEART DISEASE 05/23/2008  .  NECK PAIN, CHRONIC 04/03/2008  . AMI 06/23/2006  . DEGENERATIVE JOINT DISEASE, CERVICAL SPINE 06/23/2006  . Osteoporosis 06/23/2006  . Hyperlipidemia associated with type 2 diabetes mellitus ([place]) 05/12/2006  . DEPRESSION 05/12/2006  . Essential hypertension 05/12/2006  . ALLERGIC RHINITIS 05/12/2006  . ACID REFLUX DISEASE 05/12/2006  . SCIATICA 05/12/2006    Past  Surgical History:  Procedure Laterality Date  . ABDOMINAL HYSTERECTOMY  1977   no oophorectomy  . APPENDECTOMY    . CARDIAC CATHETERIZATION    . CATARACT EXTRACTION, BILATERAL  2011  . SPINAL FUSION  2000   Dr. Vertell Limber  . TONSILLECTOMY      OB History    No data available       Home Medications    Prior to Admission medications   Medication Sig Start Date End Date Taking? Authorizing Provider  albuterol (PROVENTIL HFA;VENTOLIN HFA) 108 (90 Base) MCG/ACT inhaler Inhale 2 puffs into the lungs every 6 (six) hours as needed for wheezing or shortness of breath. 01/10/16   Brunetta Jeans, PA-C  aspirin 81 MG tablet Take 81 mg by mouth daily.    Historical Provider, MD  atorvastatin (LIPITOR) 20 MG tablet TAKE 1 TABLET EVERY DAY 12/24/15   Midge Minium, MD  Calcium-Magnesium-Vitamin D (CALCIUM 1200+D3 PO) Take 1 tablet by mouth daily. Vitamin d is 1000iu    Historical Provider, MD  clonazePAM (KLONOPIN) 0.5 MG tablet Take 1 tablet (0.5 mg total) by mouth 2 (two) times daily as needed for anxiety. 10/02/15   Midge Minium, MD  Cyanocobalamin (VITAMIN B-12 PO) Take by mouth daily.    Historical Provider, MD  doxycycline (VIBRAMYCIN) 100 MG capsule Take 1 capsule (100 mg total) by mouth 2 (two) times daily. 01/10/16   Brunetta Jeans, PA-C  fenofibrate 160 MG tablet TAKE 1 TABLET EVERY DAY 12/10/15   Midge Minium, MD  glimepiride (AMARYL) 4 MG tablet Take 2 mg by mouth 2 (two) times daily.  02/19/14   Historical Provider, MD  guaiFENesin-codeine (ROBITUSSIN AC) 100-10 MG/5ML syrup Take 10 mLs by mouth 3 (three) times daily as needed for cough. 01/10/16   Brunetta Jeans, PA-C  HYDROcodone-acetaminophen (NORCO/VICODIN) 5-325 MG tablet Take 1 tablet by mouth every 6 (six) hours as needed for moderate pain. 10/02/15   Midge Minium, MD  insulin regular (NOVOLIN R,HUMULIN R) 250 units/2.57mL (100 units/mL) injection Inject 20 units no more than 30 minute before meals three times  daily. 10/11/15   Historical Provider, MD  Insulin Syringe-Needle U-100 (INSULIN SYRINGE .5CC/31GX5/16") 31G X 5/16" 0.5 ML MISC Use to inject insulin daily 01/26/13   Historical Provider, MD  lisinopril (PRINIVIL,ZESTRIL) 10 MG tablet Take 10 mg by mouth daily.     Historical Provider, MD  metoprolol succinate (TOPROL-XL) 50 MG 24 hr tablet TAKE 1 TABLET BY MOUTH DAILY. TAKE WITH OR IMMEDIATELY FOLLOWING A MEAL 01/03/15   Midge Minium, MD  NOVOLIN N RELION 100 UNIT/ML injection Inject 38 Units into the skin every 12 (twelve) hours.  11/25/15   Historical Provider, MD  Omega-3 Fatty Acids (FISH OIL PO) Take by mouth daily.    Historical Provider, MD  ranitidine (ZANTAC) 150 MG tablet TAKE 1 TABLET AT BEDTIME 09/11/15   Javier Glazier, MD    Family History Family History  Problem Relation Age of Onset  . Intracerebral hemorrhage Daughter     assoc with post-partum  . Diabetes Sister   . Asthma Sister   . Diabetes  Sister   . Breast cancer      ?Aunt  . Cancer      breast?  . Coronary artery disease Neg Hx     Social History Social History  Substance Use Topics  . Smoking status: Passive Smoke Exposure - Never Smoker  . Smokeless tobacco: Never Used     Comment: Mother & Children  . Alcohol use No     Allergies   Cephalexin and Pioglitazone   Review of Systems Review of Systems  All other systems reviewed and are negative.    Physical Exam Updated Vital Signs BP (!) 146/54 (BP Location: Left Arm)   Pulse 86   Temp 97.5 F (36.4 C) (Oral)   Resp 24   Ht 5' (1.524 m)   Wt 70.3 kg   SpO2 98%   BMI 30.27 kg/m   Physical Exam  Constitutional: She is oriented to person, place, and time. She appears well-developed and well-nourished. No distress.  HENT:  Head: Atraumatic.  Right Ear: External ear normal.  Left Ear: External ear normal.  Mouth/Throat: Oropharynx is clear and moist.  Eyes: Conjunctivae are normal.  Neck: Normal range of motion. Neck supple.    No nuchal rigidity  Cardiovascular: Normal rate, regular rhythm and intact distal pulses.   Pulmonary/Chest:  Mildly tachypneic, expiratory wheezes without rales or rhonchi.  Abdominal: Soft. Bowel sounds are normal. She exhibits no distension. There is no tenderness.  Musculoskeletal: She exhibits no edema.  Lymphadenopathy:    She has no cervical adenopathy.  Neurological: She is alert and oriented to person, place, and time.  Skin: No rash noted.  Psychiatric: She has a normal mood and affect.  Nursing note and vitals reviewed.    ED Treatments / Results  Labs (all labs ordered are listed, but only abnormal results are displayed) Labs Reviewed  COMPREHENSIVE METABOLIC PANEL - Abnormal; Notable for the following:       Result Value   Glucose, Bld 164 (*)    BUN 26 (*)    Creatinine, Ser 1.70 (*)    AST 42 (*)    GFR calc non Af Amer 27 (*)    GFR calc Af Amer 32 (*)    All other components within normal limits  ACETAMINOPHEN LEVEL - Abnormal; Notable for the following:    Acetaminophen (Tylenol), Serum <10 (*)    All other components within normal limits  CBC - Abnormal; Notable for the following:    RBC 3.71 (*)    All other components within normal limits  RAPID STREP SCREEN (NOT AT [facility])  CULTURE, GROUP A STREP ([place])  ETHANOL  SALICYLATE LEVEL  RAPID URINE DRUG SCREEN, HOSP PERFORMED    EKG  EKG Interpretation None       Radiology Dg Chest 2 View  Result Date: 01/11/2016 CLINICAL DATA:  Sore throat, cough EXAM: CHEST  2 VIEW COMPARISON:  CT chest dated 12/03/2015 FINDINGS: Lungs are clear.  No pleural effusion or pneumothorax. The heart is normal in size. Mild degenerative changes of the visualized thoracolumbar spine. Cervical spine fixation hardware. IMPRESSION: No evidence of acute cardiopulmonary disease. Electronically Signed   By: Julian Hy M.D.   On: 01/11/2016 10:40    Procedures Procedures (including critical care time)  Medications  Ordered in ED Medications - No data to display   Initial Impression / Assessment and Plan / ED Course  I have reviewed the triage vital signs and the nursing notes.  Pertinent labs & imaging  results that were available during my care of the patient were reviewed by me and considered in my medical decision making (see chart for details).  Clinical Course     BP (!) 145/54   Pulse 91   Temp 97.5 F (36.4 C) (Oral)   Resp 18   Ht 5' (1.524 m)   Wt 70.3 kg   SpO2 100%   BMI 30.27 kg/m    Final Clinical Impressions(s) / ED Diagnoses   Final diagnoses:  None    New Prescriptions New Prescriptions   No medications on file   12:05 PM Patient here with cold symptoms that has not fully resolved for the past several months. Does have a history of interstitial lung disease secondary to scleroderma. History of pulmonary hypertension. Patient is visibly frustrated due to her  recent sickness and reported "I just want to die" however she does not have any specific suicidal plan no prior suicidal attempt. I did offer patient to talk to a psychiatrist but patient declined. States that she is not truly suicidal just frustrated. She was recently seen yesterday for the same condition and was placed on doxycycline and albuterol inhaler. She does have history of diabetes but given her wheezing, she will benefit from a course of steroid. Prednisone 60 mg given. Will provide a breathing treatment. Workup initiated.  Care discussed with Dr. Billy Fischer.    12:07 PM Mild renal insufficiency, Cr 1.7.  IVF given. Normal BNP. Mildly elevated CBG of 133. UDS and EtOH level are normal. Strep test and chest x-ray are unremarkable. Normal WBC.  2:14 PM After receiving breathing treatment, and steroid as well as IV fluid, patient states she feels that her. She denies any active suicidal ideation. She is able to tolerates by mouth. She does feel comfortable going home. Her blood sugar is currently controlled.  She will benefit from a short course of steroids.  4:45 PM Wheezing improves significantly with treatment.  Pt stable for discharge.  Ambulate while maintaining adequate O2 on RA.     Domenic Moras, PA-C 01/11/16 [place], MD 01/13/16 2010    Gareth Labo, MD 01/13/16 [phone]

## 2016-01-11 NOTE — ED Notes (Signed)
Ambulated pt to bathroom, no issues.

## 2016-01-11 NOTE — ED Notes (Signed)
ED Provider at bedside. 

## 2016-01-11 NOTE — ED Notes (Signed)
Ambulated pt in hallway, SpO2 levels stayed between 96 and 97.

## 2016-01-13 LAB — CULTURE, GROUP A STREP (THRC)

## 2016-01-24 ENCOUNTER — Telehealth: Payer: Self-pay | Admitting: Pulmonary Disease

## 2016-01-24 DIAGNOSIS — J849 Interstitial pulmonary disease, unspecified: Secondary | ICD-10-CM

## 2016-01-24 NOTE — Telephone Encounter (Signed)
lmtcb x1 for pt. 

## 2016-01-27 NOTE — Telephone Encounter (Signed)
Called and spoke to pt. Pt is requesting to change providers from Dr. Ashok Cordia to Dr. Lenna Gilford.   Dr. Ashok Cordia please advise if ok for the pt to change providers.  Dr. Lenna Gilford please advise if ok taking on pt.

## 2016-01-27 NOTE — Telephone Encounter (Signed)
Per SN- does JN know why pt wishes to switch providers?  Ok to take on patient, would need an OV with CXR in 02/2016.  JN ok for pt to switch?  Thanks.

## 2016-01-28 LAB — HEMOGLOBIN A1C: Hemoglobin A1C: 7.5

## 2016-01-28 NOTE — Telephone Encounter (Signed)
Pt scheduled with SN 02-10-16 @ 12:00. Pt aware to have cxr prior. Pt voiced her understanding and had no further questions.  Nothing further needed.

## 2016-01-28 NOTE — Telephone Encounter (Signed)
Honestly not sure. May be she just wasn't comfortable with my age? We didn't have a negative interaction as far as I could tell. I'm fine if she wants to switch & Dr. Lenna Gilford wants to take her on. [place]

## 2016-01-29 ENCOUNTER — Telehealth: Payer: Self-pay | Admitting: *Deleted

## 2016-01-29 NOTE — Telephone Encounter (Signed)
Patient calling because she states that her insurance says she needs a new referral to Nephrology.  She does not want to see Dr. Ashok Cordia again, so she is going to see Dr. Lenna Gilford in that same practice on 02/10/16, but insurance says that she needs a new referral.   Routed to provider.

## 2016-01-29 NOTE — Telephone Encounter (Signed)
Dr Leeanne Deed is a pulmonologist and not a nephrologist.  [facility] for referral but please clarify which specialty she needs to see

## 2016-01-30 ENCOUNTER — Other Ambulatory Visit: Payer: Self-pay | Admitting: Family Medicine

## 2016-01-30 ENCOUNTER — Ambulatory Visit: Payer: Commercial Managed Care - HMO | Admitting: Pulmonary Disease

## 2016-01-30 ENCOUNTER — Ambulatory Visit: Payer: Commercial Managed Care - HMO

## 2016-01-30 DIAGNOSIS — R0602 Shortness of breath: Secondary | ICD-10-CM

## 2016-01-30 NOTE — Telephone Encounter (Signed)
Perfect!  Ok to refer

## 2016-01-30 NOTE — Telephone Encounter (Signed)
Pt wants to see Dr. Leeanne Deed in pulmonology. She had seen Dr. Tera Partridge (pulm-she says he told her she had lung cancer) does not want to see him again. New referral was placed today.

## 2016-02-03 ENCOUNTER — Other Ambulatory Visit: Payer: Self-pay | Admitting: Pulmonary Disease

## 2016-02-03 ENCOUNTER — Other Ambulatory Visit: Payer: Self-pay

## 2016-02-04 DIAGNOSIS — Z794 Long term (current) use of insulin: Secondary | ICD-10-CM | POA: Diagnosis not present

## 2016-02-04 DIAGNOSIS — E11649 Type 2 diabetes mellitus with hypoglycemia without coma: Secondary | ICD-10-CM | POA: Diagnosis not present

## 2016-02-04 DIAGNOSIS — E1165 Type 2 diabetes mellitus with hyperglycemia: Secondary | ICD-10-CM | POA: Diagnosis not present

## 2016-02-04 DIAGNOSIS — E785 Hyperlipidemia, unspecified: Secondary | ICD-10-CM | POA: Diagnosis not present

## 2016-02-04 DIAGNOSIS — E663 Overweight: Secondary | ICD-10-CM | POA: Diagnosis not present

## 2016-02-04 DIAGNOSIS — Z6829 Body mass index (BMI) 29.0-29.9, adult: Secondary | ICD-10-CM | POA: Diagnosis not present

## 2016-02-04 DIAGNOSIS — I252 Old myocardial infarction: Secondary | ICD-10-CM | POA: Diagnosis not present

## 2016-02-04 DIAGNOSIS — N189 Chronic kidney disease, unspecified: Secondary | ICD-10-CM | POA: Diagnosis not present

## 2016-02-04 DIAGNOSIS — E1122 Type 2 diabetes mellitus with diabetic chronic kidney disease: Secondary | ICD-10-CM | POA: Diagnosis not present

## 2016-02-04 DIAGNOSIS — I129 Hypertensive chronic kidney disease with stage 1 through stage 4 chronic kidney disease, or unspecified chronic kidney disease: Secondary | ICD-10-CM | POA: Diagnosis not present

## 2016-02-04 DIAGNOSIS — I10 Essential (primary) hypertension: Secondary | ICD-10-CM | POA: Diagnosis not present

## 2016-02-04 DIAGNOSIS — E1159 Type 2 diabetes mellitus with other circulatory complications: Secondary | ICD-10-CM | POA: Diagnosis not present

## 2016-02-04 DIAGNOSIS — E1169 Type 2 diabetes mellitus with other specified complication: Secondary | ICD-10-CM | POA: Diagnosis not present

## 2016-02-10 ENCOUNTER — Ambulatory Visit (INDEPENDENT_AMBULATORY_CARE_PROVIDER_SITE_OTHER)
Admission: RE | Admit: 2016-02-10 | Discharge: 2016-02-10 | Disposition: A | Discharge status: Home / Self Care | Payer: Commercial Managed Care - HMO | Source: Ambulatory Visit | Attending: Pulmonary Disease | Admitting: Pulmonary Disease

## 2016-02-10 ENCOUNTER — Ambulatory Visit (INDEPENDENT_AMBULATORY_CARE_PROVIDER_SITE_OTHER): Payer: Medicare HMO | Admitting: Pulmonary Disease

## 2016-02-10 VITALS — BP 128/70 | HR 91 | Temp 97.0°F | Ht 60.0 in | Wt 152.5 lb

## 2016-02-10 DIAGNOSIS — R0600 Dyspnea, unspecified: Secondary | ICD-10-CM

## 2016-02-10 DIAGNOSIS — Z794 Long term (current) use of insulin: Secondary | ICD-10-CM

## 2016-02-10 DIAGNOSIS — I10 Essential (primary) hypertension: Secondary | ICD-10-CM | POA: Diagnosis not present

## 2016-02-10 DIAGNOSIS — K219 Gastro-esophageal reflux disease without esophagitis: Secondary | ICD-10-CM | POA: Diagnosis not present

## 2016-02-10 DIAGNOSIS — E1165 Type 2 diabetes mellitus with hyperglycemia: Secondary | ICD-10-CM

## 2016-02-10 DIAGNOSIS — R799 Abnormal finding of blood chemistry, unspecified: Secondary | ICD-10-CM

## 2016-02-10 DIAGNOSIS — J849 Interstitial pulmonary disease, unspecified: Secondary | ICD-10-CM | POA: Diagnosis not present

## 2016-02-10 DIAGNOSIS — R05 Cough: Secondary | ICD-10-CM | POA: Diagnosis not present

## 2016-02-10 DIAGNOSIS — M159 Polyosteoarthritis, unspecified: Secondary | ICD-10-CM

## 2016-02-10 DIAGNOSIS — I2510 Atherosclerotic heart disease of native coronary artery without angina pectoris: Secondary | ICD-10-CM | POA: Insufficient documentation

## 2016-02-10 DIAGNOSIS — M1990 Unspecified osteoarthritis, unspecified site: Secondary | ICD-10-CM | POA: Insufficient documentation

## 2016-02-10 DIAGNOSIS — M150 Primary generalized (osteo)arthritis: Secondary | ICD-10-CM | POA: Diagnosis not present

## 2016-02-10 DIAGNOSIS — F411 Generalized anxiety disorder: Secondary | ICD-10-CM

## 2016-02-10 HISTORY — DX: Unspecified osteoarthritis, unspecified site: M19.90

## 2016-02-10 HISTORY — DX: Abnormal finding of blood chemistry, unspecified: R79.9

## 2016-02-10 HISTORY — DX: Dyspnea, unspecified: R06.00

## 2016-02-10 MED ORDER — FLUTICASONE-SALMETEROL 250-50 MCG/DOSE IN AEPB: 1.0000 | INHALATION_SPRAY | Freq: Two times a day (BID) | RESPIRATORY_TRACT | 0 refills | Status: DC

## 2016-02-10 MED ORDER — FLUTICASONE-SALMETEROL 250-50 MCG/DOSE IN AEPB: 1.0000 | INHALATION_SPRAY | Freq: Two times a day (BID) | RESPIRATORY_TRACT | 1 refills | Status: DC

## 2016-02-10 NOTE — Progress Notes (Signed)
Subjective:    Patient ID: Sandra [name], female    DOB: 1933-05-20, 83 y.o.   MRN: [id number]  HPI 83 y/o WF referred by DrTabori w/ multifactorial dyspnea, ILD, abnormal scleroderma labs, GERD, and mult medical issues...   ~  April 24, 2015:  Initial consult by DrNestor>      She reports that her family noticed that she was having dyspnea on exertion starting 6-7 months ago. She admits to dyspnea on exertion even short distances. She reports she has trouble getting a deep breath. She has had associated audible wheezing. She does have occasional coughing. Cough is nonproductive. She does wake up coughing at night sometimes. She reports she has gained about 10-11 pounds over the last year. She denies any change in her eating habits. She does admit she is less mobile. She reports some back pain from herniated discs has limited her capabilities as well. She denies any breathing problems as a child or young adult. Denies any history of bronchitis. She has had frequent sinus infections. She denies any seasonal allergies in the past but has had problems sneezing more frequently lately. Denies any breathing problems, wheezing, or coughing with exposure to odors, perfumes, or smells. She reports she does have chest tightness with her dyspnea but no chest pain. No edema. Does have reflux and morning brash water taste as well. She reports she only takes Prilosec as needed, not every day.       IMP/PLAN>>  83 year old female with ongoing dyspnea, cough, and wheezing over the last few months. Patient has no evidence of anemia on recent CBC. Additionally when reviewing her transthoracic echocardiogram it generally appears normal and she has no signs of volume overload today. Certainly her uncontrolled reflux could be contributing to her cough and wheezing but I would not expect a concomitant dyspnea. With her previous exposure to tobacco use secondhand COPD or even asthma is possible. I instructed the patient to  contact my office if she had any questions or new concerns before her next appointment. 1. Dyspnea: Checking 6 minute walk test on room air as well as full pulmonary function testing at follow-up appointment. Checking chest x-ray PA/LAT today. 2. GERD: Patient counseled on avoiding eating within 2 hours of bedtime. Starting on empiric Zantac 150 mg by mouth daily at bedtime. 3. ILD:  Possibly secondary to Scleroderma. She reports she has had a cough productive of a green mucus. Reports her breathing is doing ok. She is wheezing intermittently. Not currently on immunosuppression. She was on Prednisone for 3 days for a foot injury without any change in her breathing.  4. Lingula Nodule: 32m in inferior segment. Seen on high-resolution CT imaging May 2017.  ~  October 25, 2015:  F/u appt w/ DrNestor>        ILD:  Possibly secondary to Scleroderma. She reports she has had a cough productive of a green mucus. Reports her breathing is doing ok. She is wheezing intermittently. Not currently on immunosuppression. She was on Prednisone for 3 days for a foot injury without any change in her breathing.       Lingula Nodule: 538min inferior segment. Seen on high-resolution CT imaging May 2017.      GERD:  On Zantac. No reflux or dyspepsia. No morning brash water taste.       IMP/PLAN>>  [age].o. female with ILD likely secondary to underlying scleroderma. Patient remains off immunosuppression for her scleroderma. Her spirometry has significantly worsened since previous testing but  despite her back pain or walk test distance and oxygenation remained stable. She does seem to have ongoing cough from acute sinusitis which may be confounding her spirometry somewhat. We did discuss her previous high-resolution CT scan from May which did show a 5 mm nodule. Given her family history of malignancy and prior history of passive tobacco smoke exposure I feel that repeating CT imaging is reasonable. I instructed the patient to  contact my office if she had any new difficulties with her breathing. I am holding on surgical lung biopsy and immunosuppression at this time. 1. ILD: Holding off on immunosuppression and surgical lung biopsy. Repeat spirometry with DLCO and 6 minute walk test on room air at next appointment.  2. Lingula Nodule:  Repeat CT chest without contrast in November 2017.  3. Acute Sinusitis: Treating with a Z-Pak.  4. GERD: Controlled with Zantac. No changes.  5. Health Maintenance: S/P Prevnar October 2015 & Pneumovax November 2011. Recommended influenza vaccine as she recovers from her acute sinusitis.    ~  February 10, 2016:  Establish w/ SN>      69 y/o WF followed for abn Chest CT & restrictive lung physiology;  She went to the ER ~77moago w/ cough, yellow sput & incr SOB w/ intermittent wheezing reported (worse when "excited" she says);  She has VetolinHFA for prn use but when questioned she reports that her SOB is more of a sensation that she can't get a deep breath/ can't get the air "IN";  She notes some anxiety & is under some stress..Marland KitchenMarland Kitchen 1) Abn CT Chest>>     ~CT Chest 05/02/15>  Borderline heart size, atherosclerotic Ao/ great vessels/ & coronaries, borderline nodes, smallHH, patchy bilat ground glass attenuation in mid-lower lung zones, no suspicious appearing nodules, hepatic steatosis & 1cm low-attenuation lesion in segment 2 (likely a cyst)...    ~Hi-res CTChest 05/23/15 showed patchy areas of ground glass & reticulation peripherally & no lung nodules reported, no pathologic adenopathy, smallHH => radiology felt this was c/e NSIP due to scleroderma    ~CT Chest 12/03/15>  Norm heart size, atherosclerotic changes in Ao & coronaries, mild peribronchial thickening, subpleural reticular scarrin in RLL & Lingula, improved GGO sice prev CT, no pulm masses or nodules seen; 180mlow-attenuation nodule in left lobe of the liver (unchanged & most likely a cyst)...Marland KitchenMarland Kitchen) Restrictive PFTs w/ sl decr DLCO 3)  Cardiac issues>  HBP, CAD- s/pMI 03/2005,  4) Medical issues>  folowed by DrVelna Ochst al-- HBP, HL, DM2, overweight, renal insuffic (Cr=1.5-1.7 [facility], LBP, scleroderma (followed by Rheum-DrHawkes), anemia...    She had f/u Endocrine-[facility] 09/2015>  DM2 w/ BS=193, A1c=8.7, & meds incr to NPH (38uBid) & Reg (20uTid before meals), Glimep4- 1/2Bid...    She last saw rheum- DrHawkes 08/2015 and note reviewed... EXAM shows Afeb, VSS, O2sat=96% on RA;  HEENT- neg, mallampati2;  Chest- decr BS bases w/o w/r/r;  Heart- RR w/o m/r/g;  Abd- overweight, soft, nontender, neg;  Ext- neg, w/o c/c/e;  Neuro- intact...  CXR 01/11/16 (independently reviewed by me in the PACS system)>  Norm heart size, atherosclerotic Ao, clear lungs, mild pleural thickening, mild degen changes in Tspine & Cspine fusion hardware- NAD...  LABS 12/2015>  Chems- ok x BS=164, Cr=1.7;  BNP=24;  CBC- ok w/ Hg=12.1, mcv=99... IMP/PLAN>>  I reviewed w/ the pt & family the prev scans and reassured them that there was no pulm nodule and the interstitial changes were  improved on her follow up scan;  Her dyspnea seems more related to anxiety/stress & we decided to Rx w/ KLONOPIN 0.79m starting w/ 1/2 tab Bid regularly;  She is also concerned about her wheezing & we discussed adding ADVAIR250-one inhalation Bid... We plan ROV in 4-6wks to check response...     Past Medical History:  Diagnosis Date  . Acid reflux   . Acute MI   . Allergic rhinitis   . Anxiety   . Degenerative joint disease   . Depression   . Diabetes mellitus    type 2  . Gait disturbance   . GERD (gastroesophageal reflux disease)   . Hyperlipidemia   . Hyperlipidemia   . Hypertension   . Neck pain, chronic   . Osteopenia   . [place]   . Sciatica   . Takotsubo cardiomyopathy    with subsequent normalization of left ventricular systolic function  . TIA (transient ischemic attack)   . Valvular heart  disease     Past Surgical History:  Procedure Laterality Date  . ABDOMINAL HYSTERECTOMY  1977   no oophorectomy  . APPENDECTOMY    . CARDIAC CATHETERIZATION    . CATARACT EXTRACTION, BILATERAL  2011  . SPINAL FUSION  2000   Dr. SVertell Limber . TONSILLECTOMY      Outpatient Encounter Prescriptions as of 02/10/2016  Medication Sig  . albuterol (PROVENTIL HFA;VENTOLIN HFA) 108 (90 Base) MCG/ACT inhaler Inhale 2 puffs into the lungs every 6 (six) hours as needed for wheezing or shortness of breath.  .Marland Kitchenaspirin 81 MG tablet Take 81 mg by mouth daily.  .Marland Kitchenatorvastatin (LIPITOR) 20 MG tablet TAKE 1 TABLET EVERY DAY  . Calcium-Magnesium-Vitamin D (CALCIUM 1200+D3 PO) Take 1 tablet by mouth daily. Vitamin d is 1000iu  . Cyanocobalamin (VITAMIN B-12 PO) Take by mouth daily.  . fenofibrate 160 MG tablet TAKE 1 TABLET EVERY DAY  . glimepiride (AMARYL) 4 MG tablet Take 2 mg by mouth 2 (two) times daily.   . insulin regular (NOVOLIN R,HUMULIN R) 250 units/2.514m(100 units/mL) injection Inject 20 units no more than 30 minute before meals three times daily.  . Marland Kitchenisinopril (PRINIVIL,ZESTRIL) 10 MG tablet Take 10 mg by mouth daily.   . metoprolol succinate (TOPROL-XL) 50 MG 24 hr tablet TAKE 1 TABLET BY MOUTH DAILY. TAKE WITH OR IMMEDIATELY FOLLOWING A MEAL  . NOVOLIN N RELION 100 UNIT/ML injection Inject 34 Units into the skin every 12 (twelve) hours.   . Omega-3 Fatty Acids (FISH OIL PO) Take 1 capsule by mouth daily.   . ranitidine (ZANTAC) 150 MG tablet TAKE 1 TABLET AT BEDTIME  . [DISCONTINUED] benzonatate (TESSALON) 100 MG capsule Take 1 capsule (100 mg total) by mouth 3 (three) times daily as needed for cough.  . [DISCONTINUED] clonazePAM (KLONOPIN) 0.5 MG tablet Take 1 tablet (0.5 mg total) by mouth 2 (two) times daily as needed for anxiety. (Patient taking differently: Take 0.25 mg by mouth 2 (two) times daily as needed for anxiety. )  . [DISCONTINUED] doxycycline (VIBRAMYCIN) 100 MG capsule Take 1  capsule (100 mg total) by mouth 2 (two) times daily.  . [DISCONTINUED] guaiFENesin-codeine (ROBITUSSIN AC) 100-10 MG/5ML syrup Take 10 mLs by mouth 3 (three) times daily as needed for cough.  . [DISCONTINUED] HYDROcodone-acetaminophen (NORCO/VICODIN) 5-325 MG tablet Take 1 tablet by mouth every 6 (six) hours as needed for moderate pain.  . [DISCONTINUED] predniSONE (DELTASONE) 20 MG tablet 3 tabs po day one, then 2 tabs  daily x 4 days  . Fluticasone-Salmeterol (ADVAIR DISKUS) 250-50 MCG/DOSE AEPB Inhale 1 puff into the lungs 2 (two) times daily.  . Fluticasone-Salmeterol (ADVAIR DISKUS) 250-50 MCG/DOSE AEPB Inhale 1 puff into the lungs 2 (two) times daily.   No facility-administered encounter medications on file as of 02/10/2016.     Allergies  Allergen Reactions  . Cephalexin Nausea And Vomiting    Pt stated made severely sick, will never take again  . Pioglitazone     REACTION: EDEMA    Family History  Problem Relation Age of Onset  . Intracerebral hemorrhage Daughter     assoc with post-partum  . Diabetes Sister   . Asthma Sister   . Diabetes Sister   . Breast cancer      ?Aunt  . Cancer      breast?  . Coronary artery disease Neg Hx     Social History   Social History  . Marital status: Divorced    Spouse name: N/A  . Number of children: 2  . Years of education: N/A   Occupational History  . Retired     Press photographer   Social History Main Topics  . Smoking status: Passive Smoke Exposure - Never Smoker  . Smokeless tobacco: Never Used     Comment: Mother & Children  . Alcohol use No  . Drug use: No  . Sexual activity: No   Other Topics Concern  . Not on file   Social History Narrative   Lives with her daughter and G-daughter   Diet- working on portion control   Exercise- no routine exercise but active      [facility]:   Originally from [place]. Always lived in [place]. Previously has traveled to [place], [place], [place], [place], & [place]. Previously did accounting  and clerical work. Has a dog currently. Remote exposure to a parakeet. No mold exposure.     Current Medications, Allergies, Past Medical History, Past Surgical History, Family History, and Social History were reviewed in [employer] record.   Review of Systems            All symptoms NEG except where BOLDED >>  Constitutional:  F/C/S, fatigue, anorexia, unexpected weight change. HEENT:  HA, visual changes, hearing loss, earache, nasal symptoms, sore throat, mouth sores, hoarseness. Resp:  cough, sputum, hemoptysis; SOB, tightness, wheezing. Cardio:  CP, palpit, DOE, orthopnea, edema. GI:  N/V/D/C, blood in stool; reflux, abd pain, distention, gas. GU:  dysuria, freq, urgency, hematuria, flank pain, voiding difficulty. MS:  joint pain, swelling, tenderness, decr ROM; neck pain, back pain, etc. Neuro:  HA, tremors, seizures, dizziness, syncope, weakness, numbness, gait abn. Skin:  suspicious lesions or skin rash. Heme:  adenopathy, bruising, bleeding. Psyche:  confusion, agitation, sleep disturbance, hallucinations, anxiety, depression suicidal.     Objective:   Physical Exam  BP 128/70 (BP Location: Left Arm, Patient Position: Sitting, Cuff Size: Normal)   Pulse 91   Temp 97 F (36.1 C) (Oral)   Ht 5' (1.524 m)   Wt 152 lb 8 oz (69.2 kg)   SpO2 96%   BMI 29.78 kg/m  General:  Awake. Alert. Comfortable.  Integument:  Warm & dry. No rash on exposed skin.  Lymphatics:  No appreciated cervical or supraclavicular lymphadenoapthy. HEENT:  Moist mucus membranes. No oral ulcers. No scleral icterus. Moderate bilateral nasal turbinate swelling. No sinus tenderness to palpation. Cardiovascular:  Regular rate. No edema. Normal S1 & S2.  Pulmonary:  Faint crackles  right lung base unchanged. No accessory muscle use on room air. Speaking in complete sentences. Musculoskeletal:  Normal bulk and tone. No joint effusion. Mild kyphosis.   PFT 10/25/15: FVC 1.44 L (67%)  FEV1 1.25 L (79%) FEV1/FVC 0.87 FEF 25-75 1.74 L (144%) negative bronchodilator response 06/24/15: FVC 1.65 L (76%) FEV1 1.39 L (87%) FEV1/FVC 0.84 FEF 25-75 1.57 L (130%) negative bronchodilator response TLC 2.90 L (65%) RV 52% ERV 71% DLCO corrected 70% (hemoglobin 11.7)  6MWT 10/25/15:  Walked 240 meters (after 4:42) / Baseline Sat 96% on RA / Nadir Sat 96% on RA (could not finish walk due to back pain) 06/24/15: Walked 240 meters / Baseline sat 96% on RA / Nadir Sat 94% on RA  IMAGING HRCT CHEST W/O 05/23/15 (previously reviewed by me): No pathologic mediastinal adenopathy. No pleural effusion or thickening. No pericardial effusion. 24m nodule in inferior lingula. No traction bronchiectasis or honeycomb changes. Patchy areas of ground glass and reticulation peripherally. Some evidence of air trapping on exhalation.   CT CHEST W/O 05/02/15 (previously reviewed by me): Small hiatal hernia. Borderline mediastinal adenopathy. No pleural effusion or thickening. No pericardial effusion. Patchy bilateral ground glass. No parenchymal nodule.   CARDIAC TTE (08/15/15): LV w/ mild concentric hypertrophy. EF 55-60%. Normal wall motion. Grade 1 diastolic dysfunction. LA & RA normal in size. RV  Normal in size & function. Aortic valve & mitral valve normal. Poorly visualized pulmonic valve. No tricuspid regurg. No pericardial effusion.   TTE (11/28/10): LV normal in size with EF 60%. Cannot exclude regional wall motion abnormalities. LA normal in size. RA upper limits of normal in size. RV normal in size and function.o aortic stenosis or regurgitation. No mitral stenosis or regurgitation.No pulmonic regurgitation. No pericardial effusion.  LABS 05/23/15 CBC: 5.3/12.3/37.4/331 BMP: 140/4.5/108/20/32/1.42/302/9.0 LFT: 4.0/6.7/0.3/52/22/16 UA: SG 1025/Protein Negative/RBC Negative/WBC 0-5/Sqam Epith 0-5  05/10/15 ESR: 25 CRP: 0.6 Anti-CCP: <16 RF: <10 Anti-Jo1: <0.2 Centromere Ab: <0.2 DS DNA Ab:  <1 Smith Ab: <0.2 Chromatin Ab: <0.2 RNP Ab: <0.2 SSA: 1.5/1.2 SSB: <0.2/<1.0 SCL-70: 4.1/3.1 Hypersensitivity Pneumonitis Panel: Negative   04/04/15 CBC: 6.8/12.6/36.6/314 BMP: 137/4.3/101/29/23/1.4/247/9.4 LFT: 4.0/7.2/0.4/50/21/19 PRO-BNP: 51     Assessment & Plan:    IMP>>     1) Abn CT Chest>  Mild ILD at bases & improved serially on scans    2) Restrictive PFTs w/ sl decr DLCO>    3) Cardiac issues>  HBP, CAD- s/pMI 03/2005, HL, Hx TIA 11/2010    4) Medical issues>  folowed by DVelna Ochset al-- HBP, HL, DM2, overweight, renal insuffic (Cr=1.5-1.7 followed by DrColadonato), LBP, scleroderma (followed by Rheum-DrHawkes), anemia...  PLAN>>   02/10/16>    I reviewed w/ the pt & family the prev scans and reassured them that there was no pulm nodule and the interstitial changes were improved on her follow up scan;  Her dyspnea seems more related to anxiety/stress & we decided to Rx w/ KLONOPIN 0.527mstarting w/ 1/2 tab Bid regularly;  She is also concerned about her wheezing & we discussed adding ADVAIR250-one inhalation Bid.    Patient's Medications  New Prescriptions   FLUTICASONE-SALMETEROL (ADVAIR DISKUS) 250-50 MCG/DOSE AEPB    Inhale 1 puff into the lungs 2 (two) times daily.   FLUTICASONE-SALMETEROL (ADVAIR DISKUS) 250-50 MCG/DOSE AEPB    Inhale 1 puff into the lungs 2 (two) times daily.  Previous Medications   ACCU-CHEK FASTCLIX LANCETS MISC       ACCU-CHEK SMARTVIEW TEST STRIP  ALBUTEROL (PROVENTIL HFA;VENTOLIN HFA) 108 (90 BASE) MCG/ACT INHALER    Inhale 2 puffs into the lungs every 6 (six) hours as needed for wheezing or shortness of breath.   ASPIRIN 81 MG TABLET    Take 81 mg by mouth daily.   ATORVASTATIN (LIPITOR) 20 MG TABLET    TAKE 1 TABLET EVERY DAY   CALCIUM-MAGNESIUM-VITAMIN D (CALCIUM 1200+D3 PO)    Take 1 tablet by mouth daily. Vitamin d is 1000iu   CYANOCOBALAMIN (VITAMIN B-12 PO)    Take by mouth daily.   FENOFIBRATE 160 MG TABLET    TAKE 1 TABLET  EVERY DAY   GLIMEPIRIDE (AMARYL) 4 MG TABLET    Take 2 mg by mouth 2 (two) times daily.    INSULIN REGULAR (NOVOLIN R,HUMULIN R) 250 UNITS/2.5ML (100 UNITS/ML) INJECTION    Inject 20 units no more than 30 minute before meals three times daily.   INSULIN SYRINGE-NEEDLE U-100 (INSULIN SYRINGE .5CC/31GX5/16") 31G X 5/16" 0.5 ML MISC    Use to inject insulin five times daily   LISINOPRIL (PRINIVIL,ZESTRIL) 10 MG TABLET    Take 10 mg by mouth daily.    METOPROLOL SUCCINATE (TOPROL-XL) 50 MG 24 HR TABLET    TAKE 1 TABLET BY MOUTH DAILY. TAKE WITH OR IMMEDIATELY FOLLOWING A MEAL   NOVOLIN N RELION 100 UNIT/ML INJECTION    Inject 34 Units into the skin every 12 (twelve) hours.    OMEGA-3 FATTY ACIDS (FISH OIL PO)    Take 1 capsule by mouth daily.    RANITIDINE (ZANTAC) 150 MG TABLET    TAKE 1 TABLET AT BEDTIME  Modified Medications   Modified Medication Previous Medication   CLONAZEPAM (KLONOPIN) 0.5 MG TABLET clonazePAM (KLONOPIN) 0.5 MG tablet      Take 1 tablet (0.5 mg total) by mouth 2 (two) times daily as needed for anxiety.    Take 1 tablet (0.5 mg total) by mouth 2 (two) times daily as needed for anxiety.   HYDROCODONE-ACETAMINOPHEN (NORCO/VICODIN) 5-325 MG TABLET HYDROcodone-acetaminophen (NORCO/VICODIN) 5-325 MG tablet      Take 1 tablet by mouth every 6 (six) hours as needed for moderate pain.    Take 1 tablet by mouth every 6 (six) hours as needed for moderate pain.  Discontinued Medications   BENZONATATE (TESSALON) 100 MG CAPSULE    Take 1 capsule (100 mg total) by mouth 3 (three) times daily as needed for cough.   DOXYCYCLINE (VIBRAMYCIN) 100 MG CAPSULE    Take 1 capsule (100 mg total) by mouth 2 (two) times daily.   GUAIFENESIN-CODEINE (ROBITUSSIN AC) 100-10 MG/5ML SYRUP    Take 10 mLs by mouth 3 (three) times daily as needed for cough.   PREDNISONE (DELTASONE) 20 MG TABLET    3 tabs po day one, then 2 tabs daily x 4 days

## 2016-02-10 NOTE — Patient Instructions (Signed)
Sandra [name]-- it was great meeting you today...  We reviewed your previous evaluation including the CXRs and CT scans...    We are encouraged by the improvement in the CT scan...  In this regard-- let's try the combination inhaler [id number]- one inhalation twice daily on a regular basis & as always rinse your mouth out after use...  For the shortness of breath-- we are going to try to relax those chest wall muscles & allow you to get a deeper more satisfying breath "IN" by changing the dosing of your KLONOPIN 0.5mg  tabs...    For this purpose take 1/2 tab twice daily on a regular interval...  Please call me for any questions or problems...  Let's plan a follow up visit in 4-6 weeks, sooner if needed for problems.Marland KitchenMarland Kitchen

## 2016-02-11 ENCOUNTER — Telehealth: Payer: Self-pay | Admitting: Family Medicine

## 2016-02-11 DIAGNOSIS — M48061 Spinal stenosis, lumbar region without neurogenic claudication: Secondary | ICD-10-CM | POA: Diagnosis not present

## 2016-02-11 DIAGNOSIS — M5416 Radiculopathy, lumbar region: Secondary | ICD-10-CM | POA: Diagnosis not present

## 2016-02-11 NOTE — Telephone Encounter (Signed)
Pt calling asking if KT could look at her lab results from [facility] stay on 12/23. Pt was told by her diabetes doctor that there is an issues with her kidneys and wants to know what KT thinks she should do. Pt states that she has tried to call her kidney doctor and the person that answers tha phone will not listen to her and always states that there system is down and can not see any records.

## 2016-02-12 ENCOUNTER — Telehealth: Payer: Self-pay | Admitting: Pulmonary Disease

## 2016-02-12 NOTE — Telephone Encounter (Signed)
Patient notified of PCP recommendations and is agreement and expresses an understanding.  

## 2016-02-12 NOTE — Telephone Encounter (Signed)
lmomtcb x1 

## 2016-02-12 NOTE — Telephone Encounter (Signed)
Pt's Cr was 1.7 when she was seen in the ED.  She sees [facility] and they are monitoring her kidney function regularly.  If there are concerns, she can follow up with them

## 2016-02-13 NOTE — Telephone Encounter (Signed)
Called and spoke with pt and she is aware that she will need to get a copy of her formulary.  She was advised to drop this off to [place] so we can figure out what meds are covered.

## 2016-02-13 NOTE — Telephone Encounter (Signed)
Per SN---  All of these are similar   Need to check with her insurance company and get a copy of her formulary so we will know what medication is covered.

## 2016-02-13 NOTE — Telephone Encounter (Signed)
Spoke with the pt  She states that the advair was almost 200$  She states that she still has a few days left on sample  She wants to know if there is anything else that we could rec  Please advise, thanks

## 2016-02-18 ENCOUNTER — Other Ambulatory Visit: Payer: Self-pay | Admitting: Family Medicine

## 2016-02-18 DIAGNOSIS — N183 Chronic kidney disease, stage 3 unspecified: Secondary | ICD-10-CM

## 2016-02-18 DIAGNOSIS — D631 Anemia in chronic kidney disease: Secondary | ICD-10-CM

## 2016-02-28 ENCOUNTER — Other Ambulatory Visit: Payer: Self-pay | Admitting: *Deleted

## 2016-02-28 ENCOUNTER — Ambulatory Visit (INDEPENDENT_AMBULATORY_CARE_PROVIDER_SITE_OTHER): Payer: Commercial Managed Care - HMO | Admitting: Family Medicine

## 2016-02-28 ENCOUNTER — Encounter: Payer: Self-pay | Admitting: Family Medicine

## 2016-02-28 VITALS — BP 122/80 | HR 63 | Temp 98.0°F | Resp 16 | Ht 60.0 in | Wt 154.1 lb

## 2016-02-28 DIAGNOSIS — M545 Low back pain: Secondary | ICD-10-CM

## 2016-02-28 DIAGNOSIS — E11319 Type 2 diabetes mellitus with unspecified diabetic retinopathy without macular edema: Secondary | ICD-10-CM

## 2016-02-28 DIAGNOSIS — N183 Chronic kidney disease, stage 3 unspecified: Secondary | ICD-10-CM

## 2016-02-28 DIAGNOSIS — M349 Systemic sclerosis, unspecified: Secondary | ICD-10-CM | POA: Insufficient documentation

## 2016-02-28 DIAGNOSIS — Z794 Long term (current) use of insulin: Secondary | ICD-10-CM | POA: Diagnosis not present

## 2016-02-28 DIAGNOSIS — F411 Generalized anxiety disorder: Secondary | ICD-10-CM | POA: Diagnosis not present

## 2016-02-28 DIAGNOSIS — G8929 Other chronic pain: Secondary | ICD-10-CM

## 2016-02-28 DIAGNOSIS — D631 Anemia in chronic kidney disease: Secondary | ICD-10-CM

## 2016-02-28 HISTORY — DX: Systemic sclerosis, unspecified: M34.9

## 2016-02-28 LAB — IRON AND TIBC
%SAT: 28 % (ref 11–50)
Iron: 117 ug/dL (ref 45–160)
TIBC: 413 ug/dL (ref 250–450)
UIBC: 296 ug/dL (ref 125–400)

## 2016-02-28 LAB — CBC WITH DIFFERENTIAL/PLATELET
BASOS PCT: 0.9 % (ref 0.0–3.0)
Basophils Absolute: 0 10*3/uL (ref 0.0–0.1)
Eosinophils Absolute: 0.1 10*3/uL (ref 0.0–0.7)
Eosinophils Relative: 2 % (ref 0.0–5.0)
HCT: 37.1 % (ref 36.0–46.0)
Hemoglobin: 12.6 g/dL (ref 12.0–15.0)
LYMPHS ABS: 1.5 10*3/uL (ref 0.7–4.0)
LYMPHS PCT: 28.1 % (ref 12.0–46.0)
MCHC: 33.9 g/dL (ref 30.0–36.0)
MCV: 99.6 fl (ref 78.0–100.0)
Monocytes Absolute: 0.5 10*3/uL (ref 0.1–1.0)
Monocytes Relative: 9.8 % (ref 3.0–12.0)
NEUTROS PCT: 59.2 % (ref 43.0–77.0)
Neutro Abs: 3.1 10*3/uL (ref 1.4–7.7)
PLATELETS: 308 10*3/uL (ref 150.0–400.0)
RBC: 3.72 Mil/uL — ABNORMAL LOW (ref 3.87–5.11)
RDW: 13.2 % (ref 11.5–15.5)
WBC: 5.2 10*3/uL (ref 4.0–10.5)

## 2016-02-28 LAB — URINALYSIS, ROUTINE W REFLEX MICROSCOPIC
BILIRUBIN URINE: NEGATIVE
Hgb urine dipstick: NEGATIVE
Ketones, ur: NEGATIVE
Leukocytes, UA: NEGATIVE
Nitrite: NEGATIVE
PH: 5 (ref 5.0–8.0)
RBC / HPF: NONE SEEN (ref 0–?)
SPECIFIC GRAVITY, URINE: 1.025 (ref 1.000–1.030)
Total Protein, Urine: NEGATIVE
Urine Glucose: 500 — AB
Urobilinogen, UA: 0.2 (ref 0.0–1.0)

## 2016-02-28 LAB — FERRITIN: FERRITIN: 189.7 ng/mL (ref 10.0–291.0)

## 2016-02-28 LAB — COMPREHENSIVE METABOLIC PANEL
ALT: 16 U/L (ref 0–35)
AST: 21 U/L (ref 0–37)
Albumin: 4 g/dL (ref 3.5–5.2)
Alkaline Phosphatase: 59 U/L (ref 39–117)
BILIRUBIN TOTAL: 0.4 mg/dL (ref 0.2–1.2)
BUN: 26 mg/dL — ABNORMAL HIGH (ref 6–23)
CALCIUM: 9.3 mg/dL (ref 8.4–10.5)
CHLORIDE: 106 meq/L (ref 96–112)
CO2: 25 mEq/L (ref 19–32)
Creatinine, Ser: 1.41 mg/dL — ABNORMAL HIGH (ref 0.40–1.20)
GFR: 38.13 mL/min — AB (ref >60.00)
GLUCOSE: 314 mg/dL — AB (ref 70–99)
Potassium: 4.4 mEq/L (ref 3.5–5.1)
Sodium: 140 mEq/L (ref 135–145)
Total Protein: 6.5 g/dL (ref 6.0–8.3)

## 2016-02-28 LAB — PHOSPHORUS: PHOSPHORUS: 4.6 mg/dL (ref 2.3–4.6)

## 2016-02-28 MED ORDER — HYDROCODONE-ACETAMINOPHEN 5-325 MG PO TABS: 1.0000 | ORAL_TABLET | Freq: Four times a day (QID) | ORAL | 0 refills | Status: DC | PRN

## 2016-02-28 MED ORDER — CLONAZEPAM 0.5 MG PO TABS: 0.5000 mg | ORAL_TABLET | Freq: Two times a day (BID) | ORAL | 1 refills | Status: DC | PRN

## 2016-02-28 NOTE — Progress Notes (Signed)
Pre visit review using our clinic review tool, if applicable. No additional management support is needed unless otherwise documented below in the visit note. 

## 2016-02-28 NOTE — Assessment & Plan Note (Signed)
Chronic problem, following w/ Dr Trudie Reed.  Referral placed.

## 2016-02-28 NOTE — Patient Instructions (Signed)
Schedule your Medicare Wellness Visit w/ our Health Coach, Maudie Mercury for April and your physical with me The referrals are in Call with any questions or concerns Happy Valentine's Day!!!

## 2016-02-28 NOTE — Assessment & Plan Note (Signed)
Chronic problem.  Following w/ Dr Nelva Bush.  Indication for chronic opioid: chronic low back pain Medication and dose: Hydrocodone 5/325mg  prn # pills per month: 45 Last UDS date:   Pain contract signed (Y/N): Y Date narcotic database last reviewed (include red flags): 02/28/16- no red flags

## 2016-02-28 NOTE — Assessment & Plan Note (Signed)
Ongoing issue.  Following w/ Melton Alar, PA Endo- referral updated

## 2016-02-28 NOTE — Addendum Note (Signed)
Addended by: Katina Dung on: 02/28/2016 09:29 AM   Modules accepted: Orders

## 2016-02-28 NOTE — Addendum Note (Signed)
Addended by: Katina Dung on: 02/28/2016 09:28 AM   Modules accepted: Orders

## 2016-02-28 NOTE — Assessment & Plan Note (Signed)
Ongoing issue.  Well controlled.  Pt was able to decrease to 1/2 tab BID.  Refill provided.  Will follow.

## 2016-02-28 NOTE — Progress Notes (Signed)
   Subjective:    Patient ID: Sandra [name], female    DOB: 08-Dec-1933, 83 y.o.   MRN: [id number]  HPI Medication refills- pt is asking for refill on Hydrocodone.  She is on this for chronic LBP.  She had a recent epidural injxn w/ Dr Nelva Bush and 'he hit a nerve or something.  I've been hurting ever since'.    Anxiety- pt has decreased her Clonazepam to 1/2 tab twice daily at the request of Dr Lenna Gilford.  Needs referrals to Dr Trudie Reed, [facility] Al[facility], [facility]   Review of Systems For ROS see HPI     Objective:   Physical Exam  Constitutional: She is oriented to person, place, and time. She appears well-developed and well-nourished. No distress.  HENT:  Head: Normocephalic and atraumatic.  Neurological: She is alert and oriented to person, place, and time.  Skin: Skin is warm and dry. No rash noted. No erythema.  Psychiatric: She has a normal mood and affect. Her behavior is normal. Thought content normal.  Vitals reviewed.         Assessment & Plan:

## 2016-03-02 ENCOUNTER — Encounter: Payer: Self-pay | Admitting: General Practice

## 2016-03-17 ENCOUNTER — Ambulatory Visit (INDEPENDENT_AMBULATORY_CARE_PROVIDER_SITE_OTHER): Payer: Medicare HMO | Admitting: Pulmonary Disease

## 2016-03-17 VITALS — BP 120/64 | HR 77 | Temp 96.8°F | Ht 60.0 in | Wt 153.0 lb

## 2016-03-17 DIAGNOSIS — R0600 Dyspnea, unspecified: Secondary | ICD-10-CM | POA: Diagnosis not present

## 2016-03-17 DIAGNOSIS — J849 Interstitial pulmonary disease, unspecified: Secondary | ICD-10-CM | POA: Diagnosis not present

## 2016-03-17 DIAGNOSIS — F411 Generalized anxiety disorder: Secondary | ICD-10-CM | POA: Diagnosis not present

## 2016-03-17 DIAGNOSIS — Z794 Long term (current) use of insulin: Secondary | ICD-10-CM

## 2016-03-17 DIAGNOSIS — E1165 Type 2 diabetes mellitus with hyperglycemia: Secondary | ICD-10-CM | POA: Diagnosis not present

## 2016-03-17 DIAGNOSIS — M150 Primary generalized (osteo)arthritis: Secondary | ICD-10-CM | POA: Diagnosis not present

## 2016-03-17 DIAGNOSIS — I2510 Atherosclerotic heart disease of native coronary artery without angina pectoris: Secondary | ICD-10-CM

## 2016-03-17 DIAGNOSIS — I10 Essential (primary) hypertension: Secondary | ICD-10-CM

## 2016-03-17 DIAGNOSIS — M159 Polyosteoarthritis, unspecified: Secondary | ICD-10-CM

## 2016-03-17 MED ORDER — FLUTICASONE-SALMETEROL 250-50 MCG/DOSE IN AEPB: 1.0000 | INHALATION_SPRAY | Freq: Two times a day (BID) | RESPIRATORY_TRACT | 0 refills | Status: DC

## 2016-03-17 NOTE — Patient Instructions (Signed)
Today we updated your med list in our EPIC system...    Continue your current medications the same...    Continue the ADVAIR twice daily & the [facility] twice daily...  Thanks for bringing the Drug Formulary book-      We were able to find out that the [place] is covered for $47 per month after your deductible is met...    We gave you some samples today...  Call for any questions...  Let's plan a follow up visit in 3-61mo sooner if needed for problems..Marland KitchenMarland Kitchen

## 2016-03-18 DIAGNOSIS — Z6829 Body mass index (BMI) 29.0-29.9, adult: Secondary | ICD-10-CM | POA: Diagnosis not present

## 2016-03-18 DIAGNOSIS — J849 Interstitial pulmonary disease, unspecified: Secondary | ICD-10-CM | POA: Diagnosis not present

## 2016-03-18 DIAGNOSIS — E663 Overweight: Secondary | ICD-10-CM | POA: Diagnosis not present

## 2016-03-18 DIAGNOSIS — R0602 Shortness of breath: Secondary | ICD-10-CM | POA: Diagnosis not present

## 2016-03-18 DIAGNOSIS — M5136 Other intervertebral disc degeneration, lumbar region: Secondary | ICD-10-CM | POA: Diagnosis not present

## 2016-03-18 DIAGNOSIS — R768 Other specified abnormal immunological findings in serum: Secondary | ICD-10-CM | POA: Diagnosis not present

## 2016-03-20 ENCOUNTER — Encounter: Payer: Self-pay | Admitting: Pulmonary Disease
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
## 2016-05-04 ENCOUNTER — Other Ambulatory Visit: Payer: Self-pay | Admitting: Family Medicine

## 2016-05-05 DIAGNOSIS — E1165 Type 2 diabetes mellitus with hyperglycemia: Secondary | ICD-10-CM | POA: Diagnosis not present

## 2016-05-05 DIAGNOSIS — Z794 Long term (current) use of insulin: Secondary | ICD-10-CM | POA: Diagnosis not present

## 2016-05-05 DIAGNOSIS — Z6829 Body mass index (BMI) 29.0-29.9, adult: Secondary | ICD-10-CM | POA: Diagnosis not present

## 2016-05-05 DIAGNOSIS — E1122 Type 2 diabetes mellitus with diabetic chronic kidney disease: Secondary | ICD-10-CM | POA: Diagnosis not present

## 2016-05-05 DIAGNOSIS — I129 Hypertensive chronic kidney disease with stage 1 through stage 4 chronic kidney disease, or unspecified chronic kidney disease: Secondary | ICD-10-CM | POA: Diagnosis not present

## 2016-05-05 DIAGNOSIS — E663 Overweight: Secondary | ICD-10-CM | POA: Diagnosis not present

## 2016-05-05 DIAGNOSIS — Z7982 Long term (current) use of aspirin: Secondary | ICD-10-CM | POA: Diagnosis not present

## 2016-05-05 DIAGNOSIS — N189 Chronic kidney disease, unspecified: Secondary | ICD-10-CM | POA: Diagnosis not present

## 2016-06-09 ENCOUNTER — Other Ambulatory Visit: Payer: Self-pay | Admitting: Family Medicine

## 2016-06-09 DIAGNOSIS — N183 Chronic kidney disease, stage 3 unspecified: Secondary | ICD-10-CM

## 2016-06-09 DIAGNOSIS — D631 Anemia in chronic kidney disease: Secondary | ICD-10-CM

## 2016-06-10 ENCOUNTER — Other Ambulatory Visit (INDEPENDENT_AMBULATORY_CARE_PROVIDER_SITE_OTHER): Payer: Medicare HMO

## 2016-06-10 DIAGNOSIS — N183 Chronic kidney disease, stage 3 unspecified: Secondary | ICD-10-CM

## 2016-06-10 DIAGNOSIS — D649 Anemia, unspecified: Secondary | ICD-10-CM | POA: Diagnosis not present

## 2016-06-10 DIAGNOSIS — D631 Anemia in chronic kidney disease: Secondary | ICD-10-CM

## 2016-06-10 LAB — COMPREHENSIVE METABOLIC PANEL
ALBUMIN: 4 g/dL (ref 3.5–5.2)
ALT: 13 U/L (ref 0–35)
AST: 19 U/L (ref 0–37)
Alkaline Phosphatase: 54 U/L (ref 39–117)
BILIRUBIN TOTAL: 0.6 mg/dL (ref 0.2–1.2)
BUN: 29 mg/dL — ABNORMAL HIGH (ref 6–23)
CALCIUM: 9.4 mg/dL (ref 8.4–10.5)
CO2: 27 mEq/L (ref 19–32)
CREATININE: 1.45 mg/dL — AB (ref 0.40–1.20)
Chloride: 104 mEq/L (ref 96–112)
GFR: 36.89 mL/min — ABNORMAL LOW (ref >60.00)
Glucose, Bld: 215 mg/dL — ABNORMAL HIGH (ref 70–99)
Potassium: 4.2 mEq/L (ref 3.5–5.1)
Sodium: 138 mEq/L (ref 135–145)
TOTAL PROTEIN: 6.5 g/dL (ref 6.0–8.3)

## 2016-06-10 LAB — CBC WITH DIFFERENTIAL/PLATELET
BASOS ABS: 0.1 10*3/uL (ref 0.0–0.1)
BASOS PCT: 0.9 % (ref 0.0–3.0)
EOS ABS: 0.1 10*3/uL (ref 0.0–0.7)
Eosinophils Relative: 1.9 % (ref 0.0–5.0)
HEMATOCRIT: 38.7 % (ref 36.0–46.0)
HEMOGLOBIN: 13.1 g/dL (ref 12.0–15.0)
LYMPHS PCT: 35.7 % (ref 12.0–46.0)
Lymphs Abs: 2.1 10*3/uL (ref 0.7–4.0)
MCHC: 33.9 g/dL (ref 30.0–36.0)
MCV: 98.2 fl (ref 78.0–100.0)
Monocytes Absolute: 0.5 10*3/uL (ref 0.1–1.0)
Monocytes Relative: 9.5 % (ref 3.0–12.0)
Neutro Abs: 3 10*3/uL (ref 1.4–7.7)
Neutrophils Relative %: 52 % (ref 43.0–77.0)
Platelets: 305 10*3/uL (ref 150.0–400.0)
RBC: 3.94 Mil/uL (ref 3.87–5.11)
RDW: 12.9 % (ref 11.5–15.5)
WBC: 5.7 10*3/uL (ref 4.0–10.5)

## 2016-06-10 LAB — IRON AND TIBC
%SAT: 39 % (ref 11–50)
IRON: 154 ug/dL (ref 45–160)
TIBC: 394 ug/dL (ref 250–450)
UIBC: 240 ug/dL

## 2016-06-10 LAB — FERRITIN: Ferritin: 158.1 ng/mL (ref 10.0–291.0)

## 2016-06-10 LAB — PHOSPHORUS: Phosphorus: 3.9 mg/dL (ref 2.3–4.6)

## 2016-06-11 ENCOUNTER — Other Ambulatory Visit: Payer: Medicare HMO

## 2016-06-11 LAB — URINALYSIS, ROUTINE W REFLEX MICROSCOPIC
BILIRUBIN URINE: NEGATIVE
HGB URINE DIPSTICK: NEGATIVE
Ketones, ur: NEGATIVE
Leukocytes, UA: NEGATIVE
NITRITE: NEGATIVE
RBC / HPF: NONE SEEN (ref 0–?)
Specific Gravity, Urine: 1.015 (ref 1.000–1.030)
Urine Glucose: >=1000 — AB
Urobilinogen, UA: 0.2 (ref 0.0–1.0)
pH: 6.5 (ref 5.0–8.0)

## 2016-06-16 ENCOUNTER — Other Ambulatory Visit: Payer: Self-pay | Admitting: Family Medicine

## 2016-07-06 ENCOUNTER — Telehealth: Payer: Self-pay | Admitting: Family Medicine

## 2016-07-06 NOTE — Telephone Encounter (Signed)
Labs mailed today

## 2016-07-06 NOTE — Telephone Encounter (Signed)
Pt calling for lab results, pt states that she had not received a copy in mail yet.

## 2016-07-07 NOTE — Telephone Encounter (Signed)
Pt called back stating that she never heard from anyone yesterday regarding her labs, I advised pt that lab results had been mailed to her.

## 2016-07-29 DIAGNOSIS — E119 Type 2 diabetes mellitus without complications: Secondary | ICD-10-CM | POA: Diagnosis not present

## 2016-07-29 DIAGNOSIS — H35033 Hypertensive retinopathy, bilateral: Secondary | ICD-10-CM | POA: Diagnosis not present

## 2016-07-29 DIAGNOSIS — Z961 Presence of intraocular lens: Secondary | ICD-10-CM | POA: Diagnosis not present

## 2016-07-29 DIAGNOSIS — H35361 Drusen (degenerative) of macula, right eye: Secondary | ICD-10-CM | POA: Diagnosis not present

## 2016-07-29 LAB — HM DIABETES EYE EXAM

## 2016-08-04 ENCOUNTER — Encounter: Payer: Self-pay | Admitting: General Practice

## 2016-08-04 DIAGNOSIS — E663 Overweight: Secondary | ICD-10-CM | POA: Diagnosis not present

## 2016-08-04 DIAGNOSIS — E1165 Type 2 diabetes mellitus with hyperglycemia: Secondary | ICD-10-CM | POA: Diagnosis not present

## 2016-08-04 DIAGNOSIS — Z6830 Body mass index (BMI) 30.0-30.9, adult: Secondary | ICD-10-CM | POA: Diagnosis not present

## 2016-08-04 DIAGNOSIS — Z7982 Long term (current) use of aspirin: Secondary | ICD-10-CM | POA: Diagnosis not present

## 2016-08-04 DIAGNOSIS — E785 Hyperlipidemia, unspecified: Secondary | ICD-10-CM | POA: Diagnosis not present

## 2016-08-04 DIAGNOSIS — I10 Essential (primary) hypertension: Secondary | ICD-10-CM | POA: Diagnosis not present

## 2016-08-04 DIAGNOSIS — Z794 Long term (current) use of insulin: Secondary | ICD-10-CM | POA: Diagnosis not present

## 2016-08-12 LAB — HEMOGLOBIN A1C: Hemoglobin A1C: 8.6

## 2016-08-12 LAB — HM DIABETES FOOT EXAM

## 2016-08-20 ENCOUNTER — Other Ambulatory Visit: Payer: Self-pay | Admitting: Family Medicine

## 2016-08-20 DIAGNOSIS — N183 Chronic kidney disease, stage 3 unspecified: Secondary | ICD-10-CM

## 2016-08-20 DIAGNOSIS — N189 Chronic kidney disease, unspecified: Secondary | ICD-10-CM

## 2016-08-20 DIAGNOSIS — D631 Anemia in chronic kidney disease: Secondary | ICD-10-CM

## 2016-08-20 DIAGNOSIS — Z794 Long term (current) use of insulin: Secondary | ICD-10-CM | POA: Diagnosis not present

## 2016-08-20 DIAGNOSIS — E1165 Type 2 diabetes mellitus with hyperglycemia: Secondary | ICD-10-CM | POA: Diagnosis not present

## 2016-09-07 ENCOUNTER — Other Ambulatory Visit: Payer: Self-pay | Admitting: Family Medicine

## 2016-09-07 ENCOUNTER — Other Ambulatory Visit (INDEPENDENT_AMBULATORY_CARE_PROVIDER_SITE_OTHER): Payer: Medicare HMO

## 2016-09-07 DIAGNOSIS — D631 Anemia in chronic kidney disease: Secondary | ICD-10-CM

## 2016-09-07 DIAGNOSIS — R82998 Other abnormal findings in urine: Secondary | ICD-10-CM

## 2016-09-07 DIAGNOSIS — N183 Chronic kidney disease, stage 3 unspecified: Secondary | ICD-10-CM

## 2016-09-07 DIAGNOSIS — R8299 Other abnormal findings in urine: Secondary | ICD-10-CM | POA: Diagnosis not present

## 2016-09-07 DIAGNOSIS — N189 Chronic kidney disease, unspecified: Secondary | ICD-10-CM

## 2016-09-07 LAB — CBC WITH DIFFERENTIAL/PLATELET
BASOS ABS: 0 10*3/uL (ref 0.0–0.1)
BASOS PCT: 0.9 % (ref 0.0–3.0)
EOS ABS: 0.2 10*3/uL (ref 0.0–0.7)
Eosinophils Relative: 3.8 % (ref 0.0–5.0)
HCT: 39 % (ref 36.0–46.0)
Hemoglobin: 13.1 g/dL (ref 12.0–15.0)
LYMPHS ABS: 1.9 10*3/uL (ref 0.7–4.0)
Lymphocytes Relative: 44.5 % (ref 12.0–46.0)
MCHC: 33.5 g/dL (ref 30.0–36.0)
MCV: 100.9 fl — ABNORMAL HIGH (ref 78.0–100.0)
MONO ABS: 0.5 10*3/uL (ref 0.1–1.0)
Monocytes Relative: 11 % (ref 3.0–12.0)
NEUTROS ABS: 1.7 10*3/uL (ref 1.4–7.7)
NEUTROS PCT: 39.8 % — AB (ref 43.0–77.0)
PLATELETS: 272 10*3/uL (ref 150.0–400.0)
RBC: 3.87 Mil/uL (ref 3.87–5.11)
RDW: 12.5 % (ref 11.5–15.5)
WBC: 4.3 10*3/uL (ref 4.0–10.5)

## 2016-09-07 LAB — URINALYSIS, ROUTINE W REFLEX MICROSCOPIC
BILIRUBIN URINE: NEGATIVE
HGB URINE DIPSTICK: NEGATIVE
KETONES UR: NEGATIVE
NITRITE: NEGATIVE
RBC / HPF: NONE SEEN (ref 0–?)
Specific Gravity, Urine: 1.015 (ref 1.000–1.030)
Total Protein, Urine: NEGATIVE
Urine Glucose: NEGATIVE
Urobilinogen, UA: 0.2 (ref 0.0–1.0)
pH: 6 (ref 5.0–8.0)

## 2016-09-07 LAB — PHOSPHORUS: PHOSPHORUS: 3.3 mg/dL (ref 2.3–4.6)

## 2016-09-07 LAB — FERRITIN: FERRITIN: 200.4 ng/mL (ref 10.0–291.0)

## 2016-09-07 NOTE — Addendum Note (Signed)
Addended by: Katina Dung on: 09/07/2016 04:04 PM   Modules accepted: Orders

## 2016-09-08 LAB — URINE CULTURE

## 2016-09-08 LAB — IRON AND TIBC
%SAT: 38 % (ref 11–50)
IRON: 151 ug/dL (ref 45–160)
TIBC: 394 ug/dL (ref 250–450)
UIBC: 243 ug/dL

## 2016-09-14 ENCOUNTER — Ambulatory Visit (INDEPENDENT_AMBULATORY_CARE_PROVIDER_SITE_OTHER): Payer: Medicare HMO | Admitting: Pulmonary Disease

## 2016-09-14 ENCOUNTER — Encounter: Payer: Self-pay | Admitting: Pulmonary Disease

## 2016-09-14 VITALS — BP 110/70 | HR 71 | Temp 97.4°F | Ht 60.0 in | Wt 154.2 lb

## 2016-09-14 DIAGNOSIS — M349 Systemic sclerosis, unspecified: Secondary | ICD-10-CM

## 2016-09-14 DIAGNOSIS — I2510 Atherosclerotic heart disease of native coronary artery without angina pectoris: Secondary | ICD-10-CM

## 2016-09-14 DIAGNOSIS — N183 Chronic kidney disease, stage 3 (moderate): Secondary | ICD-10-CM | POA: Diagnosis not present

## 2016-09-14 DIAGNOSIS — D631 Anemia in chronic kidney disease: Secondary | ICD-10-CM | POA: Diagnosis not present

## 2016-09-14 DIAGNOSIS — Z794 Long term (current) use of insulin: Secondary | ICD-10-CM | POA: Diagnosis not present

## 2016-09-14 DIAGNOSIS — M150 Primary generalized (osteo)arthritis: Secondary | ICD-10-CM | POA: Diagnosis not present

## 2016-09-14 DIAGNOSIS — E1122 Type 2 diabetes mellitus with diabetic chronic kidney disease: Secondary | ICD-10-CM | POA: Diagnosis not present

## 2016-09-14 DIAGNOSIS — R0600 Dyspnea, unspecified: Secondary | ICD-10-CM

## 2016-09-14 DIAGNOSIS — F411 Generalized anxiety disorder: Secondary | ICD-10-CM | POA: Diagnosis not present

## 2016-09-14 DIAGNOSIS — E785 Hyperlipidemia, unspecified: Secondary | ICD-10-CM | POA: Diagnosis not present

## 2016-09-14 DIAGNOSIS — Z23 Encounter for immunization: Secondary | ICD-10-CM

## 2016-09-14 DIAGNOSIS — N2581 Secondary hyperparathyroidism of renal origin: Secondary | ICD-10-CM | POA: Diagnosis not present

## 2016-09-14 DIAGNOSIS — I10 Essential (primary) hypertension: Secondary | ICD-10-CM | POA: Diagnosis not present

## 2016-09-14 DIAGNOSIS — E1165 Type 2 diabetes mellitus with hyperglycemia: Secondary | ICD-10-CM

## 2016-09-14 DIAGNOSIS — Z6830 Body mass index (BMI) 30.0-30.9, adult: Secondary | ICD-10-CM | POA: Diagnosis not present

## 2016-09-14 DIAGNOSIS — I5181 Takotsubo syndrome: Secondary | ICD-10-CM | POA: Diagnosis not present

## 2016-09-14 DIAGNOSIS — M159 Polyosteoarthritis, unspecified: Secondary | ICD-10-CM

## 2016-09-14 DIAGNOSIS — J849 Interstitial pulmonary disease, unspecified: Secondary | ICD-10-CM | POA: Diagnosis not present

## 2016-09-14 DIAGNOSIS — J8489 Other specified interstitial pulmonary diseases: Secondary | ICD-10-CM | POA: Diagnosis not present

## 2016-09-14 MED ORDER — ALBUTEROL SULFATE 108 (90 BASE) MCG/ACT IN AEPB: 1.0000 | INHALATION_SPRAY | Freq: Four times a day (QID) | RESPIRATORY_TRACT | 1 refills | Status: DC | PRN
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
## 2016-09-14 NOTE — Patient Instructions (Signed)
Today we updated your med list in our EPIC system...    Continue your current medications the same...  We wrote a new prescription for PROAIR Respiclick - a "rescue" inhaler to use as needed for wheezing...  Continue your Advair twice daily 7 the Klonopin twice daily as we discussed...  Stay as active as possible, although I understand that you back pain limits your physical activity...  Call for any questions or if I can be of service in any way...  Let's plan a follow up visit in 17mo, sooner if needed for problems.Marland KitchenMarland Kitchen

## 2016-10-08 ENCOUNTER — Other Ambulatory Visit: Payer: Self-pay | Admitting: Family Medicine

## 2016-10-08 NOTE — Telephone Encounter (Signed)
Last OV 02/28/16 Clonazepam last filled 02/28/16 #90 with 1

## 2016-10-09 NOTE — Telephone Encounter (Signed)
Medication filled to pharmacy as requested.   

## 2016-10-13 ENCOUNTER — Encounter: Payer: Self-pay | Admitting: Family Medicine

## 2016-10-13 ENCOUNTER — Other Ambulatory Visit: Payer: Self-pay | Admitting: Family Medicine

## 2016-10-13 ENCOUNTER — Ambulatory Visit (INDEPENDENT_AMBULATORY_CARE_PROVIDER_SITE_OTHER): Payer: Medicare HMO | Admitting: Family Medicine

## 2016-10-13 VITALS — BP 122/82 | HR 72 | Temp 98.0°F | Resp 16 | Ht 60.0 in | Wt 154.5 lb

## 2016-10-13 DIAGNOSIS — I10 Essential (primary) hypertension: Secondary | ICD-10-CM | POA: Diagnosis not present

## 2016-10-13 DIAGNOSIS — R350 Frequency of micturition: Secondary | ICD-10-CM

## 2016-10-13 DIAGNOSIS — E785 Hyperlipidemia, unspecified: Secondary | ICD-10-CM

## 2016-10-13 DIAGNOSIS — R82998 Other abnormal findings in urine: Secondary | ICD-10-CM

## 2016-10-13 DIAGNOSIS — R8299 Other abnormal findings in urine: Secondary | ICD-10-CM

## 2016-10-13 DIAGNOSIS — E1169 Type 2 diabetes mellitus with other specified complication: Secondary | ICD-10-CM

## 2016-10-13 LAB — BASIC METABOLIC PANEL
BUN: 35 mg/dL — ABNORMAL HIGH (ref 6–23)
CHLORIDE: 103 meq/L (ref 96–112)
CO2: 24 mEq/L (ref 19–32)
Calcium: 9.7 mg/dL (ref 8.4–10.5)
Creatinine, Ser: 1.51 mg/dL — ABNORMAL HIGH (ref 0.40–1.20)
GFR: 35.17 mL/min — AB (ref >60.00)
Glucose, Bld: 261 mg/dL — ABNORMAL HIGH (ref 70–99)
POTASSIUM: 4.8 meq/L (ref 3.5–5.1)
SODIUM: 134 meq/L — AB (ref 135–145)

## 2016-10-13 LAB — POCT URINALYSIS DIPSTICK
BILIRUBIN UA: NEGATIVE
Blood, UA: NEGATIVE
KETONES UA: NEGATIVE
NITRITE UA: NEGATIVE
PH UA: 6.5 (ref 5.0–8.0)
Protein, UA: NEGATIVE
Spec Grav, UA: 1.015 (ref 1.010–1.025)
Urobilinogen, UA: 0.2 E.U./dL

## 2016-10-13 LAB — LIPID PANEL
CHOL/HDL RATIO: 9
Cholesterol: 193 mg/dL (ref 0–200)
HDL: 21.8 mg/dL — ABNORMAL LOW (ref >39.00)
NonHDL: 171.54
Triglycerides: 290 mg/dL — ABNORMAL HIGH (ref 0.0–149.0)
VLDL: 58 mg/dL — AB (ref 0.0–40.0)

## 2016-10-13 LAB — CBC WITH DIFFERENTIAL/PLATELET
BASOS PCT: 0.6 % (ref 0.0–3.0)
Basophils Absolute: 0 10*3/uL (ref 0.0–0.1)
EOS PCT: 2.5 % (ref 0.0–5.0)
Eosinophils Absolute: 0.1 10*3/uL (ref 0.0–0.7)
HCT: 37.7 % (ref 36.0–46.0)
Hemoglobin: 12.7 g/dL (ref 12.0–15.0)
LYMPHS ABS: 2.1 10*3/uL (ref 0.7–4.0)
Lymphocytes Relative: 37 % (ref 12.0–46.0)
MCHC: 33.7 g/dL (ref 30.0–36.0)
MCV: 100 fl (ref 78.0–100.0)
MONOS PCT: 10.6 % (ref 3.0–12.0)
Monocytes Absolute: 0.6 10*3/uL (ref 0.1–1.0)
NEUTROS ABS: 2.8 10*3/uL (ref 1.4–7.7)
NEUTROS PCT: 49.3 % (ref 43.0–77.0)
Platelets: 263 10*3/uL (ref 150.0–400.0)
RBC: 3.77 Mil/uL — AB (ref 3.87–5.11)
RDW: 12.4 % (ref 11.5–15.5)
WBC: 5.6 10*3/uL (ref 4.0–10.5)

## 2016-10-13 LAB — HEPATIC FUNCTION PANEL
ALK PHOS: 53 U/L (ref 39–117)
ALT: 15 U/L (ref 0–35)
AST: 22 U/L (ref 0–37)
Albumin: 4 g/dL (ref 3.5–5.2)
BILIRUBIN DIRECT: 0.1 mg/dL (ref 0.0–0.3)
BILIRUBIN TOTAL: 0.5 mg/dL (ref 0.2–1.2)
Total Protein: 6.6 g/dL (ref 6.0–8.3)

## 2016-10-13 LAB — TSH: TSH: 3.49 u[IU]/mL (ref 0.35–4.50)

## 2016-10-13 LAB — LDL CHOLESTEROL, DIRECT: Direct LDL: 110 mg/dL

## 2016-10-13 MED ORDER — HYDROCODONE-ACETAMINOPHEN 5-325 MG PO TABS: 1.0000 | ORAL_TABLET | Freq: Four times a day (QID) | ORAL | 0 refills | Status: DC | PRN

## 2016-10-13 NOTE — Assessment & Plan Note (Signed)
Chronic problem.  Well controlled today.  Asymptomatic.  Check labs.  No anticipated med changes.  Will follow. 

## 2016-10-13 NOTE — Progress Notes (Signed)
Reviewed data base, no red flags.  Pt takes medication very infrequently for chronic back pain.

## 2016-10-13 NOTE — Assessment & Plan Note (Signed)
Chronic problem.  Tolerating statin w/o difficulty.  Check labs.  Adjust meds prn  

## 2016-10-13 NOTE — Progress Notes (Signed)
   Subjective:    Patient ID: Sandra [name], female    DOB: March 16, 1933, 83 y.o.   MRN: [id number]  HPI ? UTI- 'something don't seem right'.  No burning w/ urination.  + increased frequency.  + urgency.  Some hesitancy.  No fevers or chills.  Pt was seen at Nephrology (Dr C)  Hyperlipidemia- chronic problem, on Lipitor and Fenofibrate.  Overdue for labs.  No abd pain, N/V.  HTN- chronic problem, well controlled on Lisinopril and metoprolol   Review of Systems For ROS see HPI     Objective:   Physical Exam  Constitutional: She is oriented to person, place, and time. She appears well-developed and well-nourished. No distress.  HENT:  Head: Normocephalic and atraumatic.  Eyes: Pupils are equal, round, and reactive to light. Conjunctivae and EOM are normal.  Neck: Normal range of motion. Neck supple. No thyromegaly present.  Cardiovascular: Normal rate, regular rhythm, normal heart sounds and intact distal pulses.   No murmur heard. Pulmonary/Chest: Effort normal and breath sounds normal. No respiratory distress.  Abdominal: Soft. She exhibits no distension. There is no tenderness.  Musculoskeletal: She exhibits no edema.  Lymphadenopathy:    She has no cervical adenopathy.  Neurological: She is alert and oriented to person, place, and time.  Skin: Skin is warm and dry.  Psychiatric: She has a normal mood and affect. Her behavior is normal.  Vitals reviewed.         Assessment & Plan:  Urinary frequency- new.  UA fairly unremarkable as are pt's sxs.  Suspect her frequency is due to the sugar in her urine but will send for cx to r/o infxn.  Will hold on abx until cx results available.  Pt expressed understanding and is in agreement w/ plan.

## 2016-10-13 NOTE — Addendum Note (Signed)
Addended by: Katina Dung on: 10/13/2016 11:01 AM   Modules accepted: Orders

## 2016-10-13 NOTE — Progress Notes (Signed)
Pre visit review using our clinic review tool, if applicable. No additional management support is needed unless otherwise documented below in the visit note. 

## 2016-10-13 NOTE — Patient Instructions (Signed)
Schedule your complete physical in 6 months and your Medicare Wellness Visit around the same time We'll notify you of your lab results and make any changes if needed Continue to work on healthy diet and regular exercise- you can do it! Call with any questions or concerns Happy Fall!!!

## 2016-10-13 NOTE — Addendum Note (Signed)
Addended by: Desmond Dike L on: 10/13/2016 11:00 AM   Modules accepted: Orders

## 2016-10-14 ENCOUNTER — Encounter: Payer: Self-pay | Admitting: General Practice

## 2016-10-15 ENCOUNTER — Other Ambulatory Visit: Payer: Self-pay | Admitting: General Practice

## 2016-10-15 LAB — URINE CULTURE
MICRO NUMBER:: 81060946
SPECIMEN QUALITY:: ADEQUATE

## 2016-10-15 MED ORDER — CEPHALEXIN 500 MG PO CAPS: 500.0000 mg | ORAL_CAPSULE | Freq: Two times a day (BID) | ORAL | 0 refills | Status: DC

## 2016-10-16 ENCOUNTER — Telehealth: Payer: Self-pay | Admitting: General Practice

## 2016-10-16 MED ORDER — AMPICILLIN 250 MG PO CAPS: 250.0000 mg | ORAL_CAPSULE | Freq: Four times a day (QID) | ORAL | 0 refills | Status: DC

## 2016-10-16 NOTE — Telephone Encounter (Signed)
New medication was filled to pharmacy per PCP. Per manager, she would call and advise pt.

## 2016-10-16 NOTE — Telephone Encounter (Signed)
Pt called in very upset. When I asked her what was wrong she said that she was so mad she was considering finding another provider. She said that she was "pissed" because we sent in a medication for her UTI that sent her to the hospital. Pt stated that she could not go out again today and hung up the phone before I could continue conversation.    When I spoke to pt yesterday and gave her lab results she advised that she understood the medications. Per allergy keflex is listed as an intolerance due to nausea and vomiting, that is why it was sent in per provider notes on labs.

## 2016-10-16 NOTE — Telephone Encounter (Signed)
I'm very sorry pt was upset.  This was a mistake and certainly not done intentionally.  We can switch to Ampicillin 250mg  4x/day x7 days.  Again, I apologize for the oversight.  If she would like to find a new provider, I wish her well and understand.

## 2016-10-19 ENCOUNTER — Other Ambulatory Visit: Payer: Self-pay | Admitting: Family Medicine

## 2016-10-19 ENCOUNTER — Other Ambulatory Visit: Payer: Self-pay | Admitting: Pulmonary Disease

## 2016-10-20 ENCOUNTER — Telehealth: Payer: Self-pay | Admitting: General Practice

## 2016-10-20 MED ORDER — AMPICILLIN 500 MG PO CAPS: 500.0000 mg | ORAL_CAPSULE | Freq: Two times a day (BID) | ORAL | 0 refills | Status: DC

## 2016-10-20 NOTE — Telephone Encounter (Signed)
Can change to Ampicillin 500 mg  -- 1 capsule BID x 7 days. Quantity 14 with 0 refill. Follow-up UA/Culture on completion of antibiotic.

## 2016-10-20 NOTE — Telephone Encounter (Signed)
Medication filled to pharmacy as requested.   

## 2016-10-20 NOTE — Telephone Encounter (Signed)
Received a fax from patient's local pharmacy. They advised that they cannot get Ampicillin 250mg  capsules in. They would like to k now if they can change the prescription to 500mg  with alternate directions or can the medication be changed?

## 2016-11-04 ENCOUNTER — Other Ambulatory Visit: Payer: Self-pay | Admitting: Family Medicine

## 2016-11-10 DIAGNOSIS — E669 Obesity, unspecified: Secondary | ICD-10-CM | POA: Diagnosis not present

## 2016-11-10 DIAGNOSIS — Z6830 Body mass index (BMI) 30.0-30.9, adult: Secondary | ICD-10-CM | POA: Diagnosis not present

## 2016-11-10 DIAGNOSIS — N183 Chronic kidney disease, stage 3 (moderate): Secondary | ICD-10-CM | POA: Diagnosis not present

## 2016-11-10 DIAGNOSIS — E1159 Type 2 diabetes mellitus with other circulatory complications: Secondary | ICD-10-CM | POA: Diagnosis not present

## 2016-11-10 DIAGNOSIS — E1165 Type 2 diabetes mellitus with hyperglycemia: Secondary | ICD-10-CM | POA: Diagnosis not present

## 2016-11-10 DIAGNOSIS — E1122 Type 2 diabetes mellitus with diabetic chronic kidney disease: Secondary | ICD-10-CM | POA: Diagnosis not present

## 2016-11-10 DIAGNOSIS — E785 Hyperlipidemia, unspecified: Secondary | ICD-10-CM | POA: Diagnosis not present

## 2016-11-10 DIAGNOSIS — I129 Hypertensive chronic kidney disease with stage 1 through stage 4 chronic kidney disease, or unspecified chronic kidney disease: Secondary | ICD-10-CM | POA: Diagnosis not present

## 2016-11-10 DIAGNOSIS — Z794 Long term (current) use of insulin: Secondary | ICD-10-CM | POA: Diagnosis not present

## 2016-11-18 ENCOUNTER — Encounter: Payer: Self-pay | Admitting: Family Medicine

## 2016-11-18 ENCOUNTER — Ambulatory Visit (INDEPENDENT_AMBULATORY_CARE_PROVIDER_SITE_OTHER): Payer: Medicare HMO | Admitting: Family Medicine

## 2016-11-18 VITALS — BP 131/80 | HR 82 | Temp 98.2°F | Resp 16 | Ht 60.0 in | Wt 155.5 lb

## 2016-11-18 DIAGNOSIS — M25511 Pain in right shoulder: Secondary | ICD-10-CM

## 2016-11-18 MED ORDER — TIZANIDINE HCL 2 MG PO TABS: 2.0000 mg | ORAL_TABLET | Freq: Three times a day (TID) | ORAL | 0 refills | Status: DC | PRN

## 2016-11-18 NOTE — Patient Instructions (Signed)
Follow up as needed or as scheduled We'll call you with your Orthopedic appt for the shoulder/neck pain Use the Tizanidine as needed for muscle spasm- may cause drowsiness Continue the tylenol as needed HEAT! Use the pain meds only as needed for severe pain Call with any questions or concerns Hang in there!!!

## 2016-11-18 NOTE — Progress Notes (Signed)
   Subjective:    Patient ID: Sandra [name], female    DOB: 1933/11/17, 83 y.o.   MRN: [id number]  HPI Neck pain- sxs started 2 weeks ago.  R side of neck and will extend into R arm and will develop numbness and tingling.  Pain is worse w/ neck rotation to R.  Some improvement w/ tylenol and heating pad.  Will take hydrocodone as needed.  No loss of strength in R arm.  No change in pillows or sleeping arrangements.  Pain w/ looking upward.   Review of Systems For ROS see HPI     Objective:   Physical Exam  Constitutional: She is oriented to person, place, and time. She appears well-developed and well-nourished. No distress.  HENT:  Head: Normocephalic and atraumatic.  Musculoskeletal: She exhibits tenderness (TTP over R trap spasm). She exhibits no deformity.  Full ROM of neck + Hawkings sign No TTP over clavicle, cervical spine  Neurological: She is alert and oriented to person, place, and time. She has normal reflexes. No cranial nerve deficit. Coordination normal.  Skin: Skin is warm and dry. No rash noted. No erythema.  Vitals reviewed.         Assessment & Plan:  R shoulder pain- new.  Pt's pain seems to be more shoulder than neck pain.  + trap spasm w/ TTP.  Start low dose Tizanidine prn.  Continue tylenol and as needed, hydrocodone.  Refer to ortho for management as she has radicular sxs that may represent spurring.  Reviewed supportive care and red flags that should prompt return.  Pt expressed understanding and is in agreement w/ plan.

## 2016-11-19 ENCOUNTER — Other Ambulatory Visit: Payer: Self-pay | Admitting: *Deleted

## 2016-11-19 DIAGNOSIS — N183 Chronic kidney disease, stage 3 unspecified: Secondary | ICD-10-CM

## 2016-11-19 DIAGNOSIS — D631 Anemia in chronic kidney disease: Secondary | ICD-10-CM

## 2016-11-19 DIAGNOSIS — N189 Chronic kidney disease, unspecified: Secondary | ICD-10-CM

## 2016-11-26 ENCOUNTER — Encounter (HOSPITAL_COMMUNITY): Payer: Self-pay | Admitting: Family Medicine

## 2016-11-26 ENCOUNTER — Ambulatory Visit (HOSPITAL_COMMUNITY)
Admission: EM | Admit: 2016-11-26 | Discharge: 2016-11-26 | Disposition: A | Discharge status: Home / Self Care | Payer: Medicare HMO | Attending: Family Medicine | Admitting: Family Medicine

## 2016-11-26 ENCOUNTER — Ambulatory Visit (INDEPENDENT_AMBULATORY_CARE_PROVIDER_SITE_OTHER): Payer: Medicare HMO

## 2016-11-26 DIAGNOSIS — R0782 Intercostal pain: Secondary | ICD-10-CM | POA: Diagnosis not present

## 2016-11-26 DIAGNOSIS — W19XXXA Unspecified fall, initial encounter: Secondary | ICD-10-CM

## 2016-11-26 DIAGNOSIS — S299XXA Unspecified injury of thorax, initial encounter: Secondary | ICD-10-CM | POA: Diagnosis not present

## 2016-11-26 DIAGNOSIS — R0781 Pleurodynia: Secondary | ICD-10-CM | POA: Diagnosis not present

## 2016-11-26 DIAGNOSIS — S300XXA Contusion of lower back and pelvis, initial encounter: Secondary | ICD-10-CM

## 2016-11-26 NOTE — ED Triage Notes (Signed)
Pt had a fall in the bathtub a few days ago. Reports she is having left upper and mid back pain with bruising. Denies hitting head, LOC or dizziness.

## 2016-11-26 NOTE — ED Provider Notes (Signed)
[place]    CSN: [id number] Arrival date & time: 11/26/16  1242     History   Chief Complaint Chief Complaint  Patient presents with  . Fall    HPI Sandra [name] is a 83 y.o. female.   [place] in her bathtub 6 days ago, presenting today complaining of left mid back pain and left rib pain. She is mainly concern for rib fracture. She reports bruising at the area. She denies LOC or head injury during the fall. She has no headache, dizziness, blurry vision. She is not on anticoagulation. She reports to have full ROM. Is ambulatory and drove herself here to the Urgent Care. She is taking tylenol at home for pain relief.    The history is provided by the patient.    Past Medical History:  Diagnosis Date  . Acid reflux   . Acute MI ([place])   . Allergic rhinitis   . Anxiety   . Degenerative joint disease   . Depression   . Diabetes mellitus    type 2  . Gait disturbance   . GERD (gastroesophageal reflux disease)   . Hyperlipidemia   . Hyperlipidemia   . Hypertension   . Neck pain, chronic   . Osteopenia   . SAH (subarachnoid hemorrhage) ([place])   . Sciatica   . Takotsubo cardiomyopathy    with subsequent normalization of left ventricular systolic function  . TIA (transient ischemic attack)   . Valvular heart disease     Patient Active Problem List   Diagnosis Date Noted  . Scleroderma ([place]) 02/28/2016  . Dyspnea 02/10/2016  . Abnormal blood finding 02/10/2016  . CAD (coronary artery disease) 02/10/2016  . Osteoarthritis 02/10/2016  . Nodule on liver 12/06/2015  . ILD (interstitial lung disease) ([place]) 05/31/2015  . Type 2 diabetes mellitus with hyperglycemia ([place]) 03/11/2015  . Diabetes mellitus type 2 with retinopathy ([place]) 03/04/2015  . Vitamin D deficiency 10/01/2014  . Leukocytopenia 05/31/2014  . Chronic kidney disease (CKD), stage III (moderate) ([place]) 05/31/2014  . Fatigue 05/31/2014  . Lower abdominal pain 05/17/2014  . Nausea with vomiting  09/22/2013  . Chronic right SI joint pain 09/22/2013  . 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 Abrasion of ear canal 09/28/2012  . History of subarachnoid hemorrhage 04/07/2012  . Unspecified cerebral artery occlusion with cerebral infarction 04/07/2012  . UTI (urinary tract infection) 11/02/2011  . General medical examination 01/07/2011  . Chest pain 11/27/2010  . Achilles tendon injury 11/27/2010  . Back pain 07/01/2010  . Breast pain 04/15/2010  . GAIT DISTURBANCE 08/07/2009  . Anxiety state 07/03/2009  . VALVULAR HEART DISEASE 05/23/2008  . NECK PAIN, CHRONIC 04/03/2008  . DEGENERATIVE JOINT DISEASE, CERVICAL SPINE 06/23/2006  . Osteoporosis 06/23/2006  . Hyperlipidemia associated with type 2 diabetes mellitus ([place]) 05/12/2006  . DEPRESSION 05/12/2006  . Essential hypertension 05/12/2006  . ALLERGIC RHINITIS 05/12/2006  . ACID REFLUX DISEASE 05/12/2006  . SCIATICA 05/12/2006    Past Surgical History:  Procedure Laterality Date  . ABDOMINAL HYSTERECTOMY  1977   no oophorectomy  . APPENDECTOMY    . CARDIAC CATHETERIZATION    . CATARACT EXTRACTION, BILATERAL  2011  . SPINAL FUSION  2000   Dr. Vertell Limber  . TONSILLECTOMY      OB History    No data available       Home Medications    Prior to Admission medications   Medication Sig Start Date End Date Taking? Authorizing Provider  ACCU-CHEK FASTCLIX LANCETS  [place]  02/04/16   [provider]  ACCU-CHEK SMARTVIEW test strip  02/04/16   [provider]  Albuterol Sulfate (PROAIR RESPICLICK) 267 (90 Base) MCG/ACT AEPB Inhale 1-2 puffs into the lungs every 6 (six) hours as needed. 09/14/16   Noralee Space, MD  aspirin 81 MG tablet Take 81 mg by mouth daily.    [provider]  atorvastatin (LIPITOR) 20 MG tablet TAKE 1 TABLET EVERY DAY 10/20/16   Midge Minium, MD  Calcium-Magnesium-Vitamin D (CALCIUM 1200+D3 PO) Take 1 tablet by mouth daily. Vitamin d is 1000iu    [provider]  clonazePAM  (KLONOPIN) 0.5 MG tablet TAKE 1 TABLET TWICE DAILY AS NEEDED FOR ANXIETY 10/08/16   Midge Minium, MD  Cyanocobalamin (VITAMIN B-12 PO) Take by mouth daily.    [provider]  fenofibrate 160 MG tablet TAKE 1 TABLET EVERY DAY 09/08/16   Midge Minium, MD  Fluticasone-Salmeterol (ADVAIR DISKUS) 250-50 MCG/DOSE AEPB Inhale 1 puff into the lungs 2 (two) times daily. 03/17/16   Noralee Space, MD  glimepiride (AMARYL) 4 MG tablet Take 2 mg by mouth 2 (two) times daily.  02/19/14   [provider]  HYDROcodone-acetaminophen (NORCO/VICODIN) 5-325 MG tablet Take 1 tablet by mouth every 6 (six) hours as needed for moderate pain. 10/13/16   Midge Minium, MD  insulin regular (NOVOLIN R,HUMULIN R) 250 units/2.41mL (100 units/mL) injection Inject 20 units no more than 76minutes before meals three times daily 10/11/15   [provider]  Insulin Syringe-Needle U-100 (INSULIN SYRINGE .5CC/31GX5/16") 31G X 5/16" 0.5 ML MISC Use to inject insulin five times daily 02/04/16   [provider]  lisinopril (PRINIVIL,ZESTRIL) 10 MG tablet Take 10 mg by mouth daily.     [provider]  metoprolol succinate (TOPROL-XL) 50 MG 24 hr tablet TAKE 1 TABLET EVERY DAY.  TAKE  WITH  OR  IMMEDIATELY FOLLOWING A MEAL  11/04/16   Midge Minium, MD  NOVOLIN N RELION 100 UNIT/ML injection 8 units in the morning and 6 units in the evening.    [provider]  Omega-3 Fatty Acids (FISH OIL PO) Take 1 capsule by mouth daily.     [provider]  ranitidine (ZANTAC) 150 MG tablet TAKE 1 TABLET AT BEDTIME 10/20/16   Javier Glazier, MD  tiZANidine (ZANAFLEX) 2 MG tablet Take 1 tablet (2 mg total) by mouth every 8 (eight) hours as needed for muscle spasms. 11/18/16   Midge Minium, MD    Family History Family History  Problem Relation Age of Onset  . Intracerebral hemorrhage Daughter        assoc with post-partum  . Diabetes Sister   . Asthma Sister    . Diabetes Sister   . Breast cancer Unknown        ?Aunt  . Cancer Unknown        breast?  . Coronary artery disease Neg Hx     Social History Social History   Tobacco Use  . Smoking status: Passive Smoke Exposure - Never Smoker  . Smokeless tobacco: Never Used  . Tobacco comment: Mother & Children  Substance Use Topics  . Alcohol use: No    Alcohol/week: 0.0 oz  . Drug use: No     Allergies   Cephalexin and Pioglitazone   Review of Systems Review of Systems  Constitutional:       See HPI     Physical Exam Triage Vital  Signs ED Triage Vitals  Enc Vitals Group     BP 11/26/16 1305 129/88     Pulse Rate 11/26/16 1305 67     Resp 11/26/16 1305 18     Temp 11/26/16 1305 98.4 F (36.9 C)     Temp src --      SpO2 11/26/16 1305 100 %     Weight --      Height --      Head Circumference --      Peak Flow --      Pain Score 11/26/16 1304 8     Pain Loc --      Pain Edu? --      Excl. in [place]? --    No data found.  Updated Vital Signs BP 129/88   Pulse 67   Temp 98.4 F (36.9 C)   Resp 18   SpO2 100%   Visual Acuity Right Eye Distance:   Left Eye Distance:   Bilateral Distance:    Right Eye Near:   Left Eye Near:    Bilateral Near:     Physical Exam  Constitutional: She is oriented to person, place, and time. She appears well-developed and well-nourished.  HENT:  Head: Normocephalic and atraumatic.  Eyes: Conjunctivae are normal. Pupils are equal, round, and reactive to light.  Neck: Normal range of motion.  Cardiovascular: Normal rate, regular rhythm and normal heart sounds.  Pulmonary/Chest: Effort normal and breath sounds normal. She has no wheezes.  Abdominal: Soft. Bowel sounds are normal. There is no tenderness.  Musculoskeletal: Normal range of motion. She exhibits tenderness (Tender to palpate over the bruised area and its surrounding area.  ). She exhibits no deformity.  Has full spine ROM. Has upper and lower extremity ROMs    Neurological: She is alert and oriented to person, place, and time.  Skin: Skin is warm and dry.  Ecchymosis noted on left mid back.   Nursing note and vitals reviewed.    UC Treatments / Results  Labs (all labs ordered are listed, but only abnormal results are displayed) Labs Reviewed - No data to display  EKG  EKG Interpretation None       Radiology Dg Ribs Unilateral W/chest Left  Result Date: 11/26/2016 CLINICAL DATA:  Left posterior rib pain and bruising after falling in the bath tub three nights ago. EXAM: LEFT RIBS AND CHEST - 3+ VIEW COMPARISON:  Chest dated 02/10/2016. FINDINGS: The cardiac silhouette remains borderline enlarged. Less prominent diffuse peribronchial thickening. No rib fracture pneumothorax seen. Cervical spine fixation hardware. Mild scoliosis. IMPRESSION: 1. No rib fracture seen. 2. Mild bronchitic changes with improvement. Electronically Signed   By: Claudie Revering M.D.   On: 11/26/2016 13:43    Procedures Procedures (including critical care time)  Medications Ordered in UC Medications - No data to display   Initial Impression / Assessment and Plan / UC Course  I have reviewed the triage vital signs and the nursing notes.  Pertinent labs & imaging results that were available during my care of the patient were reviewed by me and considered in my medical decision making (see chart for details).  Final Clinical Impressions(s) / UC Diagnoses   Final diagnoses:  Contusion of lower back, initial encounter  Fall, initial encounter   Xray negative for rib fracture; patient informed and reassured. Please continue with tylenol or vicodin for pain relief. Please follow up with Dr. Birdie Riddle if not better.   ED Discharge Orders  None     Controlled Substance Prescriptions [place] Controlled Substance Registry consulted? Not Applicable   Barry Dienes, NP 11/26/16 1347

## 2016-12-14 ENCOUNTER — Other Ambulatory Visit (INDEPENDENT_AMBULATORY_CARE_PROVIDER_SITE_OTHER): Payer: Medicare HMO

## 2016-12-14 DIAGNOSIS — N183 Chronic kidney disease, stage 3 unspecified: Secondary | ICD-10-CM

## 2016-12-14 DIAGNOSIS — N189 Chronic kidney disease, unspecified: Secondary | ICD-10-CM | POA: Diagnosis not present

## 2016-12-14 DIAGNOSIS — D631 Anemia in chronic kidney disease: Secondary | ICD-10-CM

## 2016-12-14 LAB — CBC WITH DIFFERENTIAL/PLATELET
BASOS PCT: 0.5 % (ref 0.0–3.0)
Basophils Absolute: 0 10*3/uL (ref 0.0–0.1)
EOS PCT: 2.4 % (ref 0.0–5.0)
Eosinophils Absolute: 0.1 10*3/uL (ref 0.0–0.7)
HCT: 36.2 % (ref 36.0–46.0)
Hemoglobin: 12.2 g/dL (ref 12.0–15.0)
LYMPHS ABS: 2 10*3/uL (ref 0.7–4.0)
Lymphocytes Relative: 41.6 % (ref 12.0–46.0)
MCHC: 33.6 g/dL (ref 30.0–36.0)
MCV: 101.2 fl — AB (ref 78.0–100.0)
MONO ABS: 0.5 10*3/uL (ref 0.1–1.0)
Monocytes Relative: 10 % (ref 3.0–12.0)
NEUTROS PCT: 45.5 % (ref 43.0–77.0)
Neutro Abs: 2.2 10*3/uL (ref 1.4–7.7)
Platelets: 281 10*3/uL (ref 150.0–400.0)
RBC: 3.57 Mil/uL — ABNORMAL LOW (ref 3.87–5.11)
RDW: 13 % (ref 11.5–15.5)
WBC: 4.9 10*3/uL (ref 4.0–10.5)

## 2016-12-14 LAB — URINALYSIS, ROUTINE W REFLEX MICROSCOPIC
Bilirubin Urine: NEGATIVE
Hgb urine dipstick: NEGATIVE
Ketones, ur: NEGATIVE
Nitrite: NEGATIVE
PH: 6 (ref 5.0–8.0)
RBC / HPF: NONE SEEN (ref 0–?)
SPECIFIC GRAVITY, URINE: 1.015 (ref 1.000–1.030)
TOTAL PROTEIN, URINE-UPE24: NEGATIVE
Urobilinogen, UA: 0.2 (ref 0.0–1.0)

## 2016-12-14 LAB — PHOSPHORUS: PHOSPHORUS: 3.2 mg/dL (ref 2.3–4.6)

## 2016-12-15 LAB — IRON,TIBC AND FERRITIN PANEL
%SAT: 40 % (ref 11–50)
Ferritin: 293 ng/mL — ABNORMAL HIGH (ref 20–288)
Iron: 140 ug/dL (ref 45–160)
TIBC: 354 ug/dL (ref 250–450)

## 2017-01-25 ENCOUNTER — Other Ambulatory Visit: Payer: Self-pay | Admitting: Family Medicine

## 2017-02-03 ENCOUNTER — Encounter (HOSPITAL_COMMUNITY): Payer: Self-pay | Admitting: Emergency Medicine

## 2017-02-03 ENCOUNTER — Other Ambulatory Visit: Payer: Self-pay

## 2017-02-03 ENCOUNTER — Ambulatory Visit (INDEPENDENT_AMBULATORY_CARE_PROVIDER_SITE_OTHER): Payer: Medicare HMO

## 2017-02-03 ENCOUNTER — Ambulatory Visit (HOSPITAL_COMMUNITY)
Admission: EM | Admit: 2017-02-03 | Discharge: 2017-02-03 | Disposition: A | Discharge status: Home / Self Care | Payer: Medicare HMO | Attending: Family Medicine | Admitting: Family Medicine

## 2017-02-03 DIAGNOSIS — S62632A Displaced fracture of distal phalanx of right middle finger, initial encounter for closed fracture: Secondary | ICD-10-CM | POA: Diagnosis not present

## 2017-02-03 DIAGNOSIS — S62631A Displaced fracture of distal phalanx of left index finger, initial encounter for closed fracture: Secondary | ICD-10-CM | POA: Diagnosis not present

## 2017-02-03 DIAGNOSIS — S62633A Displaced fracture of distal phalanx of left middle finger, initial encounter for closed fracture: Secondary | ICD-10-CM | POA: Diagnosis not present

## 2017-02-03 DIAGNOSIS — M20012 Mallet finger of left finger(s): Secondary | ICD-10-CM

## 2017-02-03 NOTE — ED Provider Notes (Signed)
02/03/2017 8:05 PM   DOB: 1933/12/18 / MRN: [id number]  SUBJECTIVE:  Sandra [name] is a 84 y.o. female presenting for middle finger pain which started after a "incident" 4 days ago.  She is unable to use the digit.  She is not sure how she hurt her hand.  She is allergic to cephalexin and pioglitazone.   She  has a past medical history of Acid reflux, Acute MI ([place]), Allergic rhinitis, Anxiety, Degenerative joint disease, Depression, Diabetes mellitus, Gait disturbance, GERD (gastroesophageal reflux disease), Hyperlipidemia, Hyperlipidemia, Hypertension, Neck pain, chronic, Osteopenia, SAH (subarachnoid hemorrhage) ([place]), Sciatica, Takotsubo cardiomyopathy, TIA (transient ischemic attack), and Valvular heart disease.    She  reports that she is a non-smoker but has been exposed to tobacco smoke. she has never used smokeless tobacco. She reports that she does not drink alcohol or use drugs. She  reports that she does not engage in sexual activity. The patient  has a past surgical history that includes Abdominal hysterectomy (1977); Spinal fusion (2000); Cataract extraction, bilateral (2011); Cardiac catheterization; Appendectomy; and Tonsillectomy.  Her family history includes Asthma in her sister; Breast cancer in her unknown relative; Cancer in her unknown relative; Diabetes in her sister and sister; Intracerebral hemorrhage in her daughter.  Review of Systems  Musculoskeletal: Positive for joint pain.    OBJECTIVE:  BP (!) 139/50 (BP Location: Left Arm)   Pulse 86   Temp 98 F (36.7 C) (Oral)   Resp 20   SpO2 98%   Physical Exam  Cardiovascular: Normal rate and regular rhythm.  Pulmonary/Chest: Effort normal and breath sounds normal.  Musculoskeletal:       Hands:   No results found for this or any previous visit (from the past 72 hour(s)).  Dg Finger Middle Left  Result Date: 02/03/2017 CLINICAL DATA:  Left middle finger got smashed with subsequent pain EXAM: LEFT MIDDLE FINGER  2+V COMPARISON:  None. FINDINGS: Displaced fracture of the dorsal base of third distal phalanx with 3.2 mm of displacement. No other fracture or dislocation. Severe osteoarthritis of the left third DIP joint. IMPRESSION: Displaced fracture of the dorsal base of third distal phalanx with 3.2 mm of displacement. Electronically Signed   By: Kathreen Devoid   On: 02/03/2017 19:57    ASSESSMENT AND PLAN:  Orders Placed This Encounter  Procedures  . DG Finger Middle Left    Standing Status:   Standing    Number of Occurrences:   1    Order Specific Question:   Reason for Exam (SYMPTOM  OR DIAGNOSIS REQUIRED)    Answer:   jammed finger     Mallet finger of left hand: Splint provided in the office with self-adhesive wrap.  Patient advised to take Tylenol and call hand as instructed on AVS.      The patient is advised to call or return to clinic if she does not see an improvement in symptoms, or to seek the care of the closest emergency department if she worsens with the above plan.   Philis Fendt, MHS, PA-C 02/03/2017 8:05 PM   Tereasa Coop, PA-C 02/03/17 2006

## 2017-02-03 NOTE — ED Triage Notes (Signed)
Left middle finger pain, visible bruise just below nail.  Incident occurred sunday

## 2017-02-03 NOTE — Discharge Instructions (Signed)
Take Tylenol 1000 mg every 6-8 hours as needed for finger pain.  Please call Dr. Lequita Asal office.  Make sure that finger stays straight.

## 2017-02-10 ENCOUNTER — Telehealth: Payer: Self-pay

## 2017-02-10 ENCOUNTER — Telehealth: Payer: Self-pay | Admitting: Family Medicine

## 2017-02-10 ENCOUNTER — Encounter: Payer: Self-pay | Admitting: General Practice

## 2017-02-10 NOTE — Telephone Encounter (Signed)
Pt called to cancel AWV and physical with office and stated she would be finding another primary doctor and hung up the phone.

## 2017-02-10 NOTE — Telephone Encounter (Signed)
Spoke with patient regarding upcoming appointment for "broken finger".  Patient was evaluated and treated in the ER, was instructed to follow up with orthopedic surgery (Dr. Caralyn Guile). Patient states she will not f/u with orthopedic surgery because she has no plans of having surgery. Advised patient that provider is a hand specialist and not necessarily planning for surgery. Patient continues to refuse to see ortho. Patient states she wants to see her PCP to make sure she is healing properly d/t her diabetes.

## 2017-02-10 NOTE — Telephone Encounter (Signed)
I cannot assess bone healing and strongly recommend she follow up w/ Dr Apolonio Schneiders (who is fantastic) as recommended

## 2017-02-10 NOTE — Telephone Encounter (Signed)
Paperwork given to Glass blower/designer for processing.

## 2017-02-10 NOTE — Telephone Encounter (Signed)
Given pt's lack of compliance w/ tx plan and rudeness to staff, she will be dismissed

## 2017-02-10 NOTE — Telephone Encounter (Signed)
Copied from [place]. Topic: Appointment Scheduling - Scheduling Inquiry for Clinic >> Feb 10, 2017 12:07 PM Arletha Grippe wrote: Reason for CRM: pt would like to transfer from Dr Birdie Riddle to Dr Janett Billow [place].  She states that she has hd some problem with Dr Birdie Riddle and said that its time for a change.  Pt cb # is [phone]   That is fine- however she will have to wait until my next new pt appt if she wants to switch

## 2017-02-10 NOTE — Telephone Encounter (Signed)
Called patient to advise of PCP recommendations.. Pt yelled emphatically "oh, Lord, I guess I do not have a primary care doctor anymore do I? And then hung up the phone.    Please advise?

## 2017-02-11 ENCOUNTER — Telehealth: Payer: Self-pay | Admitting: Family Medicine

## 2017-02-11 NOTE — Telephone Encounter (Signed)
Patient dismissed from [facility] by Annye Asa MD , effective February 10, 2017. Dismissal letter sent out by certified / registered mail.  daj

## 2017-02-12 ENCOUNTER — Ambulatory Visit: Payer: Medicare HMO | Admitting: Family Medicine

## 2017-02-12 DIAGNOSIS — M79645 Pain in left finger(s): Secondary | ICD-10-CM | POA: Diagnosis not present

## 2017-02-12 DIAGNOSIS — S62633A Displaced fracture of distal phalanx of left middle finger, initial encounter for closed fracture: Secondary | ICD-10-CM | POA: Diagnosis not present

## 2017-02-12 DIAGNOSIS — M79644 Pain in right finger(s): Secondary | ICD-10-CM | POA: Diagnosis not present

## 2017-02-16 DIAGNOSIS — I129 Hypertensive chronic kidney disease with stage 1 through stage 4 chronic kidney disease, or unspecified chronic kidney disease: Secondary | ICD-10-CM | POA: Diagnosis not present

## 2017-02-16 DIAGNOSIS — F418 Other specified anxiety disorders: Secondary | ICD-10-CM | POA: Diagnosis not present

## 2017-02-16 DIAGNOSIS — E1122 Type 2 diabetes mellitus with diabetic chronic kidney disease: Secondary | ICD-10-CM | POA: Diagnosis not present

## 2017-02-16 DIAGNOSIS — Z794 Long term (current) use of insulin: Secondary | ICD-10-CM | POA: Diagnosis not present

## 2017-02-16 DIAGNOSIS — E785 Hyperlipidemia, unspecified: Secondary | ICD-10-CM | POA: Diagnosis not present

## 2017-02-16 DIAGNOSIS — Z8673 Personal history of transient ischemic attack (TIA), and cerebral infarction without residual deficits: Secondary | ICD-10-CM | POA: Diagnosis not present

## 2017-02-16 DIAGNOSIS — N183 Chronic kidney disease, stage 3 (moderate): Secondary | ICD-10-CM | POA: Diagnosis not present

## 2017-02-16 DIAGNOSIS — E1165 Type 2 diabetes mellitus with hyperglycemia: Secondary | ICD-10-CM | POA: Diagnosis not present

## 2017-02-17 ENCOUNTER — Ambulatory Visit: Payer: Medicare HMO | Admitting: Family Medicine

## 2017-02-17 ENCOUNTER — Telehealth: Payer: Self-pay | Admitting: General Practice

## 2017-02-17 NOTE — Telephone Encounter (Signed)
Copied from [place] [phone] [phone]. Topic: Complaint - Dismissed Patient >> Feb 17, 2017  8:57 AM Chauncey Mann A wrote: Patient name/MRN/Acct #: Sandra [name] [id number] DOS:  Details of complaint: Pt c/o of care received from Dr. Birdie Riddle. Pt stated Dr. Birdie Riddle has been negligent in her care. Pt was recently dismissed from [facility] and stated she does not know how she was non-compliant with treatment plant because she never had a treatment plan.   Route to Engineer, building services.  Spoke with patient about dismissal. Patient was frustrated by dismissal decision as she feels she fired [place]. States she will file a complaint with her insurance company.

## 2017-02-17 NOTE — Telephone Encounter (Signed)
Received signed domestic return receipt verifying delivery of certified letter on February 16, 2017 Article number [id number] [id number]

## 2017-03-01 ENCOUNTER — Telehealth: Payer: Self-pay | Admitting: Family Medicine

## 2017-03-02 NOTE — Telephone Encounter (Signed)
Called patient about her dissatisfaction with being discharged from [facility] and about her medications she is worried she will run out of her medications.  Pt states that she did not decline going to the ortho and she did go to the hand specialist.  She went on 1/25 to Dr. Caralyn Guile.  She still does not understand why she should be dismissed because she hasn't been non-compliant.  She was just scared her finger was infected and has diabetes and didn't want to lose her finger. Patient states she has asked for a list of providers from her insurance company and will choose one from there.  She states she has heard bad things about [facility] and [facility] so she doesn't want to go to those. When I asked her which meds she would like for to to request to be refilled, she stated that she would just wait until she gets into more of a pinch to request that.  She is trying to find another doctor.  Patient states she is going to work with this with [facility] because she feels like what was noted about her in her chart is not the real story of what really happened.  She reiterated that she was just trying to have an appointment to be sure her finger wasn't infected.  She did admit that she said she wasn't fond of the idea of having surgery to the nurse, but she said she did not refuse to go.   Patient has my number if she gets in a pinch with her refills in the next month before she finds another provider.

## 2017-03-02 NOTE — Telephone Encounter (Signed)
I'm sorry she doesn't understand why she was dismissed.  If you refer to the phone note, she told Maudie Mercury she refused to see Ortho as directed by the ER.  When I stressed that she needed to see Ortho, she yelled that she didn't 'have a primary care doctor' and hung up on staff.  This is where the non-compliance piece comes into play.  We are happy to provide refills for the 30 days after dismissal while she finds a new doctor and I wish her the best of luck with that.

## 2017-03-15 DIAGNOSIS — D631 Anemia in chronic kidney disease: Secondary | ICD-10-CM | POA: Diagnosis not present

## 2017-03-15 DIAGNOSIS — N183 Chronic kidney disease, stage 3 (moderate): Secondary | ICD-10-CM | POA: Diagnosis not present

## 2017-03-16 DIAGNOSIS — S62633A Displaced fracture of distal phalanx of left middle finger, initial encounter for closed fracture: Secondary | ICD-10-CM | POA: Diagnosis not present

## 2017-03-16 DIAGNOSIS — D631 Anemia in chronic kidney disease: Secondary | ICD-10-CM | POA: Diagnosis not present

## 2017-03-16 DIAGNOSIS — N183 Chronic kidney disease, stage 3 (moderate): Secondary | ICD-10-CM | POA: Diagnosis not present

## 2017-03-17 ENCOUNTER — Ambulatory Visit: Payer: Medicare HMO | Admitting: Pulmonary Disease

## 2017-03-17 ENCOUNTER — Encounter: Payer: Self-pay | Admitting: Pulmonary Disease

## 2017-03-17 VITALS — BP 136/78 | HR 66 | Temp 98.0°F | Ht 60.0 in | Wt 154.2 lb

## 2017-03-17 DIAGNOSIS — R0600 Dyspnea, unspecified: Secondary | ICD-10-CM | POA: Diagnosis not present

## 2017-03-17 DIAGNOSIS — K219 Gastro-esophageal reflux disease without esophagitis: Secondary | ICD-10-CM

## 2017-03-17 DIAGNOSIS — J849 Interstitial pulmonary disease, unspecified: Secondary | ICD-10-CM | POA: Diagnosis not present

## 2017-03-17 NOTE — Progress Notes (Signed)
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 inhaler...  03/17/17>   SNikolais stable on the Advair250Bid & Klonopin 0.531m- 1/2 to 1 tab bid prn; OK to continue same regimen; we again discussed need for wt reduction & exercise but she is challenged by LBP etc...    Patient's Medications  New Prescriptions   No medications on file  Previous Medications   ACCU-CHEK FASTCLIX LANCETS MISC       ACCU-CHEK SMARTVIEW TEST STRIP       ALBUTEROL SULFATE (PROAIR RESPICLICK) 1078990 BASE) MCG/ACT AEPB    Inhale 1-2 puffs into the lungs every 6 (six) hours as needed.   ASPIRIN 81 MG TABLET    Take 81 mg by mouth daily.   ATORVASTATIN (LIPITOR) 20 MG TABLET    TAKE 1 TABLET EVERY DAY   CALCIUM-MAGNESIUM-VITAMIN D (CALCIUM 1200+D3 PO)    Take 1 tablet by mouth daily. Vitamin d is 1000iu   CLONAZEPAM (KLONOPIN) 0.5 MG TABLET    TAKE 1 TABLET TWICE DAILY AS NEEDED FOR ANXIETY   CYANOCOBALAMIN (VITAMIN B-12 PO)    Take by mouth daily.   FENOFIBRATE 160 MG TABLET    TAKE 1 TABLET EVERY DAY   FLUTICASONE-SALMETEROL (ADVAIR DISKUS) 250-50 MCG/DOSE AEPB    Inhale 1 puff into the lungs 2 (two) times daily.   GLIMEPIRIDE  (AMARYL) 4 MG TABLET    Take 2 mg by mouth 2 (two) times daily.    HYDROCODONE-ACETAMINOPHEN (NORCO/VICODIN) 5-325 MG TABLET    Take 1 tablet by mouth every 6 (six) hours as needed for moderate pain.   INSULIN REGULAR (NOVOLIN R,HUMULIN R) 250 UNITS/2.5ML (100 UNITS/ML) INJECTION    Inject 20 units no more than 3041mtes before meals three times daily   INSULIN SYRINGE-NEEDLE U-100 (INSULIN SYRINGE .5CC/31GX5/16") 31G X 5/16" 0.5 ML MISC    Use to inject insulin five times daily   LISINOPRIL (PRINIVIL,ZESTRIL) 10 MG TABLET    Take 10 mg by mouth daily.    METOPROLOL SUCCINATE (TOPROL-XL) 50 MG 24 HR TABLET    TAKE 1 TABLET EVERY DAY.  TAKE  WITH  OR  IMMEDIATELY FOLLOWING A MEAL    NOVOLIN N RELION 100 UNIT/ML INJECTION    8 units in the morning and 6 units in the evening.   OMEGA-3 FATTY ACIDS (FISH OIL PO)    Take 1 capsule by mouth daily.    RANITIDINE (ZANTAC) 150 MG TABLET    TAKE 1 TABLET AT BEDTIME   TIZANIDINE (ZANAFLEX) 2 MG TABLET    Take 1 tablet (2 mg total) by mouth every 8 (eight) hours as needed for muscle spasms.  Modified Medications   No medications on file  Discontinued Medications   No medications on file

## 2017-03-17 NOTE — Patient Instructions (Signed)
Today we updated your med list in our EPIC system...    Continue your current medications the same...  Continue the Advair twice daily & the Klonopin as needed...  Call for any breathing issues or if we can be of service in any way...  Let's plan a follow up visit in 53mo, sooner if needed for problems.Marland KitchenMarland Kitchen

## 2017-03-25 ENCOUNTER — Encounter: Payer: Medicare HMO | Admitting: Family Medicine

## 2017-03-25 ENCOUNTER — Ambulatory Visit: Payer: Medicare HMO

## 2017-05-23 ENCOUNTER — Encounter: Payer: Self-pay | Admitting: Gastroenterology

## 2017-08-06 ENCOUNTER — Other Ambulatory Visit: Payer: Self-pay | Admitting: Family Medicine

## 2017-09-14 ENCOUNTER — Encounter: Payer: Self-pay | Admitting: Pulmonary Disease

## 2017-09-14 ENCOUNTER — Ambulatory Visit: Payer: Medicare HMO | Admitting: Pulmonary Disease

## 2017-09-14 VITALS — BP 136/70 | HR 83 | Temp 98.0°F | Ht 60.0 in | Wt 157.8 lb

## 2017-09-14 DIAGNOSIS — I10 Essential (primary) hypertension: Secondary | ICD-10-CM | POA: Diagnosis not present

## 2017-09-14 DIAGNOSIS — R0600 Dyspnea, unspecified: Secondary | ICD-10-CM

## 2017-09-14 DIAGNOSIS — M349 Systemic sclerosis, unspecified: Secondary | ICD-10-CM

## 2017-09-14 DIAGNOSIS — K2100 Gastro-esophageal reflux disease with esophagitis, without bleeding: Secondary | ICD-10-CM

## 2017-09-14 DIAGNOSIS — I2510 Atherosclerotic heart disease of native coronary artery without angina pectoris: Secondary | ICD-10-CM

## 2017-09-14 DIAGNOSIS — J849 Interstitial pulmonary disease, unspecified: Secondary | ICD-10-CM

## 2017-09-14 NOTE — Progress Notes (Addendum)
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            All symptoms NEG except where BOLDED >>  Constitutional:  F/C/S, fatigue, anorexia, unexpected weight change. HEENT:  HA, visual changes, hearing loss, earache, nasal symptoms, sore throat, mouth sores, hoarseness. Resp:  cough, sputum, hemoptysis; SOB, tightness, wheezing. Cardio:  CP, palpit, DOE, orthopnea, edema. GI:  N/V/D/C, blood in stool; reflux, abd pain, distention, gas. GU:  dysuria, freq, urgency, hematuria, flank pain, voiding difficulty. MS:  joint pain, swelling, tenderness, decr ROM; neck pain, back pain, etc. Neuro:  HA, tremors, seizures, dizziness, syncope, weakness, numbness, gait abn. Skin:  suspicious lesions or skin rash. Heme:  adenopathy, bruising, bleeding. Psyche:  confusion,  agitation, sleep disturbance, hallucinations, anxiety, depression suicidal.     Objective:   Physical Exam  BP 136/70 (BP Location: Left Arm, Cuff Size: Normal)   Pulse 83   Temp 98 F (36.7 C) (Oral)   Ht 5' (1.524 m)   Wt 157 lb 12.8 oz (71.6 kg)  SpO2 96%   BMI 30.82 kg/m  General:  Awake. Alert. Comfortable.  Integument:  Warm & dry. No rash on exposed skin.  Lymphatics:  No appreciated cervical or supraclavicular lymphadenoapthy. HEENT:  Moist mucus membranes. No oral ulcers. No scleral icterus. Moderate bilateral nasal turbinate swelling. No sinus tenderness to palpation. Cardiovascular:  Regular rate. No edema. Normal S1 & S2.  Pulmonary:  Faint crackles right lung base unchanged. No accessory muscle use on room air. Speaking in complete sentences. Musculoskeletal:  Normal bulk and tone. No joint effusion. Mild kyphosis.   PFT 10/25/15: FVC 1.44 L (67%) FEV1 1.25 L (79%) FEV1/FVC 0.87 FEF 25-75 1.74 L (144%) negative bronchodilator response 06/24/15: FVC 1.65 L (76%) FEV1 1.39 L (87%) FEV1/FVC 0.84 FEF 25-75 1.57 L (130%) negative bronchodilator response TLC 2.90 L (65%) RV 52% ERV 71% DLCO corrected 70% (hemoglobin 11.7)  6MWT 10/25/15:  Walked 240 meters (after 4:42) / Baseline Sat 96% on RA / Nadir Sat 96% on RA (could not finish walk due to back pain) 06/24/15: Walked 240 meters / Baseline sat 96% on RA / Nadir Sat 94% on RA  IMAGING HRCT CHEST W/O 05/23/15 (previously reviewed by me): No pathologic mediastinal adenopathy. No pleural effusion or thickening. No pericardial effusion. 31m nodule in inferior lingula. No traction bronchiectasis or honeycomb changes. Patchy areas of ground glass and reticulation peripherally. Some evidence of air trapping on exhalation.   CT CHEST W/O 05/02/15 (previously reviewed by me): Small hiatal hernia. Borderline mediastinal adenopathy. No pleural effusion or thickening. No pericardial effusion. Patchy bilateral ground glass. No  parenchymal nodule.   CARDIAC TTE (08/15/15): LV w/ mild concentric hypertrophy. EF 55-60%. Normal wall motion. Grade 1 diastolic dysfunction. LA & RA normal in size. RV  Normal in size & function. Aortic valve & mitral valve normal. Poorly visualized pulmonic valve. No tricuspid regurg. No pericardial effusion.   TTE (11/28/10): LV normal in size with EF 60%. Cannot exclude regional wall motion abnormalities. LA normal in size. RA upper limits of normal in size. RV normal in size and function.o aortic stenosis or regurgitation. No mitral stenosis or regurgitation.No pulmonic regurgitation. No pericardial effusion.  LABS 05/23/15 CBC: 5.3/12.3/37.4/331 BMP: 140/4.5/108/20/32/1.42/302/9.0 LFT: 4.0/6.7/0.3/52/22/16 UA: SG 1025/Protein Negative/RBC Negative/WBC 0-5/Sqam Epith 0-5  05/10/15 ESR: 25 CRP: 0.6 Anti-CCP: <16 RF: <10 Anti-Jo1: <0.2 Centromere Ab: <0.2 DS DNA Ab: <1 Smith Ab: <0.2 Chromatin Ab: <0.2 RNP Ab: <0.2 SSA: 1.5/1.2 SSB: <0.2/<1.0 SCL-70: 4.1/3.1 Hypersensitivity Pneumonitis Panel: Negative   04/04/15 CBC: 6.8/12.6/36.6/314 BMP: 137/4.3/101/29/23/1.4/247/9.4 LFT: 4.0/7.2/0.4/50/21/19 PRO-BNP: 51     Assessment & Plan:    IMP>>     1) DYSPNEA>  Multifactorial w/ components from anxiety, deconditioning/ lack of exercise, ILD w/ mild restriction, & pt perceived wheezing    1) Abn CT Chest>  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  PLAN>>   02/10/16>   I reviewed w/ the pt & [name] the prev scans and reassured them that there was no pulm nodule and the interstitial changes were improved on Sandra [name] follow up scan;  Sandra [name] dyspnea seems more related to  anxiety/stress & we decided to Rx w/ KLONOPIN 0.518mstarting w/ 1/2  tab Bid regularly;  Sandra [name] is also concerned about Sandra [name] wheezing & we discussed adding ADVAIR250-one inhalation Bid. 03/17/16>   [place] is improved w/ the Klonopin & Advair- continue rx;  Sandra [name] has ILD- prob NSIP related to Sandra [name] CTD (scleroderma);  We are monitoring Hi-res CT Chest and PFTs- ret in 33mofor follow up... 09/14/16>   We discussed continuing current meds, incr exercise program as best Sandra [name] can, consider water aerobics but Sandra [name] is afraid of the water;  OK Flu shot, ProairHFA rescue inhaler...  03/17/17>   SBeritis stable on the Advair250Bid & Klonopin 0.5653m- 1/2to1 tabBid prn; OK to continue same regimen; we again discussed need for wt reduction & exercise but Sandra [name] is challenged by LBP etc...  09/14/17>   We discussed Sylvias AR & rec to use Claritin Qam, Saline nasal mist prn, Flonase Qhs;  Continue Sandra [name] AdvairBid & Klonopin Bid;  Try to incr exercise program and get weight down;  Sandra [name]'ll be due for f/u CXR vs CT Chest & consideration of f/u PFTs on return=> we discussed my up-coming retirement & we will arrange for a follow up appt w/ one of my partners in about 53m61mome   Sandra [name]'s Medications  New Prescriptions   No medications on file  Previous Medications   ACCU-CHEK FASTCLIX LANCETS MISC       ACCU-CHEK SMARTVIEW TEST STRIP       ALBUTEROL SULFATE (PROAIR RESPICLICK) 1080340 BASE) MCG/ACT AEPB    Inhale 1-2 puffs into the lungs every 6 (six) hours as needed.   ASPIRIN 81 MG TABLET    Take 81 mg by mouth daily.   ATORVASTATIN (LIPITOR) 20 MG TABLET    TAKE 1 TABLET EVERY DAY   CALCIUM-MAGNESIUM-VITAMIN D (CALCIUM 1200+D3 PO)    Take 1 tablet by mouth daily. Vitamin d is 1000iu   CLONAZEPAM (KLONOPIN) 0.5 MG TABLET    TAKE 1 TABLET TWICE DAILY AS NEEDED FOR ANXIETY   CYANOCOBALAMIN (VITAMIN B-12 PO)    Take by mouth daily.   FENOFIBRATE 160 MG TABLET    TAKE 1 TABLET EVERY DAY   FLUTICASONE-SALMETEROL (ADVAIR DISKUS) 250-50  MCG/DOSE AEPB    Inhale 1 puff into the lungs 2 (two) times daily.   GLIMEPIRIDE (AMARYL) 4 MG TABLET    Take 2 mg by mouth 2 (two) times daily.    HYDROCODONE-ACETAMINOPHEN (NORCO/VICODIN) 5-325 MG TABLET    Take 1 tablet by mouth every 6 (six) hours as needed for moderate pain.   INSULIN REGULAR (NOVOLIN R,HUMULIN R) 250 UNITS/2.5ML (100 UNITS/ML) INJECTION    24 units TID before meals   INSULIN SYRINGE-NEEDLE U-100 (INSULIN SYRINGE .5CC/31GX5/16") 31G X 5/16" 0.5 ML MISC    40 units in morning and 18units at night   LISINOPRIL (PRINIVIL,ZESTRIL) 10 MG TABLET    Take 10 mg by mouth daily.    METOPROLOL SUCCINATE (TOPROL-XL) 50 MG 24 HR TABLET    TAKE 1 TABLET EVERY DAY.  TAKE  WITH  OR  IMMEDIATELY FOLLOWING A MEAL    NOVOLIN N RELION 100 UNIT/ML INJECTION    40 units in the morning and 18 units in the evening.   OMEGA-3 FATTY ACIDS (FISH OIL PO)    Take 1 capsule by mouth daily.    RANITIDINE (ZANTAC) 150 MG TABLET    TAKE 1 TABLET AT BEDTIME   TIZANIDINE (ZANAFLEX) 2 MG TABLET    Take 1 tablet (2 mg total) by mouth every 8 (eight) hours as needed for  muscle spasms.  Modified Medications   No medications on file  Discontinued Medications   No medications on file

## 2017-09-14 NOTE — Patient Instructions (Signed)
Today we updated your med list in our EPIC system...    Continue your current medications the same...  For your ALLERGIES:\    Take the OTC CLARITIN 10mg  each AM...    Use a SALINE nasal mist every 1-2 H as needed to clear the nasal passages...    Use the OTC FLONASE (Fluticasone) 2 sprays in each nostril at bedtime...  Try the natural tears to help flush the pollen out of your eyes as well...  Call for any questions or if I can be of service in any way...  Let's plan a follow up visit in 40months with my young partner, DrIcard (I know you will like him a lot).Marland KitchenMarland Kitchen

## 2017-11-15 ENCOUNTER — Emergency Department (HOSPITAL_COMMUNITY): Payer: Medicare HMO

## 2017-11-15 ENCOUNTER — Encounter (HOSPITAL_COMMUNITY): Payer: Self-pay | Admitting: Emergency Medicine

## 2017-11-15 ENCOUNTER — Emergency Department (HOSPITAL_COMMUNITY)
Admission: EM | Admit: 2017-11-15 | Discharge: 2017-11-16 | Disposition: A | Discharge status: Home / Self Care | Payer: Medicare HMO | Attending: Emergency Medicine | Admitting: Emergency Medicine

## 2017-11-15 ENCOUNTER — Other Ambulatory Visit: Payer: Self-pay

## 2017-11-15 DIAGNOSIS — Z7722 Contact with and (suspected) exposure to environmental tobacco smoke (acute) (chronic): Secondary | ICD-10-CM | POA: Diagnosis not present

## 2017-11-15 DIAGNOSIS — Z794 Long term (current) use of insulin: Secondary | ICD-10-CM | POA: Insufficient documentation

## 2017-11-15 DIAGNOSIS — Y999 Unspecified external cause status: Secondary | ICD-10-CM | POA: Diagnosis not present

## 2017-11-15 DIAGNOSIS — Z7982 Long term (current) use of aspirin: Secondary | ICD-10-CM | POA: Diagnosis not present

## 2017-11-15 DIAGNOSIS — Z79899 Other long term (current) drug therapy: Secondary | ICD-10-CM | POA: Insufficient documentation

## 2017-11-15 DIAGNOSIS — S0003XA Contusion of scalp, initial encounter: Secondary | ICD-10-CM | POA: Insufficient documentation

## 2017-11-15 DIAGNOSIS — Y939 Activity, unspecified: Secondary | ICD-10-CM | POA: Insufficient documentation

## 2017-11-15 DIAGNOSIS — Y92002 Bathroom of unspecified non-institutional (private) residence single-family (private) house as the place of occurrence of the external cause: Secondary | ICD-10-CM | POA: Insufficient documentation

## 2017-11-15 DIAGNOSIS — I10 Essential (primary) hypertension: Secondary | ICD-10-CM | POA: Diagnosis not present

## 2017-11-15 DIAGNOSIS — R51 Headache: Secondary | ICD-10-CM | POA: Diagnosis present

## 2017-11-15 DIAGNOSIS — E119 Type 2 diabetes mellitus without complications: Secondary | ICD-10-CM | POA: Insufficient documentation

## 2017-11-15 DIAGNOSIS — S0090XA Unspecified superficial injury of unspecified part of head, initial encounter: Secondary | ICD-10-CM

## 2017-11-15 DIAGNOSIS — W182XXA Fall in (into) shower or empty bathtub, initial encounter: Secondary | ICD-10-CM | POA: Diagnosis not present

## 2017-11-15 DIAGNOSIS — W19XXXA Unspecified fall, initial encounter: Secondary | ICD-10-CM

## 2017-11-15 DIAGNOSIS — S0990XA Unspecified injury of head, initial encounter: Secondary | ICD-10-CM

## 2017-11-15 LAB — CBG MONITORING, ED: GLUCOSE-CAPILLARY: 239 mg/dL — AB (ref 70–99)

## 2017-11-15 NOTE — ED Provider Notes (Signed)
[facility] EMERGENCY DEPARTMENT Provider Note   CSN: [id number] Arrival date & time: 11/15/17  1939     History   Chief Complaint Chief Complaint  Patient presents with  . Fall  . Headache    HPI Sandra [name] is a 84 y.o. female.  Patient with a complicated medical history presents after fall that occurred around 6:00 pm tonight. She was standing on a small chair hanging a sweater on the shower curtain rod and fell, turning and landing on her back inside the bathtub. She did not lose consciousness. She was able to get up with assistance from family and has been ambulatory since. She complains of headache where she hit her head and pain across the upper back. No chest pain, SOB, abdominal pain, nausea, vomiting. She is not anticoagulated.   The history is provided by the patient. No language interpreter was used.    Past Medical History:  Diagnosis Date  . Acid reflux   . Acute MI ([place])   . Allergic rhinitis   . Anxiety   . Degenerative joint disease   . Depression   . Diabetes mellitus    type 2  . Gait disturbance   . GERD (gastroesophageal reflux disease)   . Hyperlipidemia   . Hyperlipidemia   . Hypertension   . Neck pain, chronic   . Osteopenia   . SAH (subarachnoid hemorrhage) ([place])   . Sciatica   . Takotsubo cardiomyopathy    with subsequent normalization of left ventricular systolic function  . TIA (transient ischemic attack)   . Valvular heart disease     Patient Active Problem List   Diagnosis Date Noted  . Scleroderma ([place]) 02/28/2016  . Dyspnea 02/10/2016  . Abnormal blood finding 02/10/2016  . CAD (coronary artery disease) 02/10/2016  . Osteoarthritis 02/10/2016  . Nodule on liver 12/06/2015  . ILD (interstitial lung disease) ([place]) 05/31/2015  . Type 2 diabetes mellitus with hyperglycemia ([place]) 03/11/2015  . Diabetes mellitus type 2 with retinopathy ([place]) 03/04/2015  . Vitamin D deficiency 10/01/2014  . Leukocytopenia  05/31/2014  . Chronic kidney disease (CKD), stage III (moderate) ([place]) 05/31/2014  . Fatigue 05/31/2014  . Lower abdominal pain 05/17/2014  . Nausea with vomiting 09/22/2013  . Chronic right SI joint pain 09/22/2013  . 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 Abrasion of ear canal 09/28/2012  . History of subarachnoid hemorrhage 04/07/2012  . Unspecified cerebral artery occlusion with cerebral infarction 04/07/2012  . UTI (urinary tract infection) 11/02/2011  . General medical examination 01/07/2011  . Chest pain 11/27/2010  . Achilles tendon injury 11/27/2010  . Back pain 07/01/2010  . Breast pain 04/15/2010  . GAIT DISTURBANCE 08/07/2009  . Anxiety state 07/03/2009  . VALVULAR HEART DISEASE 05/23/2008  . NECK PAIN, CHRONIC 04/03/2008  . DEGENERATIVE JOINT DISEASE, CERVICAL SPINE 06/23/2006  . Osteoporosis 06/23/2006  . Hyperlipidemia associated with type 2 diabetes mellitus ([place]) 05/12/2006  . DEPRESSION 05/12/2006  . Essential hypertension 05/12/2006  . ALLERGIC RHINITIS 05/12/2006  . ACID REFLUX DISEASE 05/12/2006  . SCIATICA 05/12/2006    Past Surgical History:  Procedure Laterality Date  . ABDOMINAL HYSTERECTOMY  1977   no oophorectomy  . APPENDECTOMY    . CARDIAC CATHETERIZATION    . CATARACT EXTRACTION, BILATERAL  2011  . SPINAL FUSION  2000   Dr. Vertell Limber  . TONSILLECTOMY       OB History   None      Home Medications    Prior to  Admission medications   Medication Sig Start Date End Date Taking? Authorizing Provider  ACCU-CHEK FASTCLIX LANCETS [place]  02/04/16   [provider]  ACCU-CHEK SMARTVIEW test strip  02/04/16   [provider]  Albuterol Sulfate (PROAIR RESPICLICK) 102 (90 Base) MCG/ACT AEPB Inhale 1-2 puffs into the lungs every 6 (six) hours as needed. Patient not taking: Reported on 09/14/2017 09/14/16   Noralee Space, MD  aspirin 81 MG tablet Take 81 mg by mouth daily.    [provider]  atorvastatin (LIPITOR) 20 MG tablet TAKE  1 TABLET EVERY DAY 10/20/16   Midge Minium, MD  Calcium-Magnesium-Vitamin D (CALCIUM 1200+D3 PO) Take 1 tablet by mouth daily. Vitamin d is 1000iu    [provider]  clonazePAM (KLONOPIN) 0.5 MG tablet TAKE 1 TABLET TWICE DAILY AS NEEDED FOR ANXIETY 10/08/16   Midge Minium, MD  Cyanocobalamin (VITAMIN B-12 PO) Take by mouth daily.    [provider]  fenofibrate 160 MG tablet TAKE 1 TABLET EVERY DAY 01/26/17   Midge Minium, MD  Fluticasone-Salmeterol (ADVAIR DISKUS) 250-50 MCG/DOSE AEPB Inhale 1 puff into the lungs 2 (two) times daily. 03/17/16   Noralee Space, MD  glimepiride (AMARYL) 4 MG tablet Take 2 mg by mouth 2 (two) times daily.  02/19/14   [provider]  HYDROcodone-acetaminophen (NORCO/VICODIN) 5-325 MG tablet Take 1 tablet by mouth every 6 (six) hours as needed for moderate pain. 10/13/16   Midge Minium, MD  insulin regular (NOVOLIN R,HUMULIN R) 250 units/2.48mL (100 units/mL) injection 24 units TID before meals 10/11/15   [provider]  Insulin Syringe-Needle U-100 (INSULIN SYRINGE .5CC/31GX5/16") 31G X 5/16" 0.5 ML MISC 40 units in morning and 18units at night 02/04/16   [provider]  lisinopril (PRINIVIL,ZESTRIL) 10 MG tablet Take 10 mg by mouth daily.     [provider]  metoprolol succinate (TOPROL-XL) 50 MG 24 hr tablet TAKE 1 TABLET EVERY DAY.  TAKE  WITH  OR  IMMEDIATELY FOLLOWING A MEAL  11/04/16   Midge Minium, MD  NOVOLIN N RELION 100 UNIT/ML injection 40 units in the morning and 18 units in the evening.    [provider]  Omega-3 Fatty Acids (FISH OIL PO) Take 1 capsule by mouth daily.     [provider]  ranitidine (ZANTAC) 150 MG tablet TAKE 1 TABLET AT BEDTIME 10/20/16   Javier Glazier, MD  tiZANidine (ZANAFLEX) 2 MG tablet Take 1 tablet (2 mg total) by mouth every 8 (eight) hours as needed for muscle spasms. 11/18/16   Midge Minium, MD    Family  History Family History  Problem Relation Age of Onset  . Intracerebral hemorrhage Daughter        assoc with post-partum  . Diabetes Sister   . Asthma Sister   . Diabetes Sister   . Breast cancer Unknown        ?Aunt  . Cancer Unknown        breast?  . Coronary artery disease Neg Hx     Social History Social History   Tobacco Use  . Smoking status: Passive Smoke Exposure - Never Smoker  . Smokeless tobacco: Never Used  . Tobacco comment: Mother & Children  Substance Use Topics  . Alcohol use: No    Alcohol/week: 0.0 standard drinks  . Drug use: No     Allergies   Cephalexin and Pioglitazone   Review of Systems Review of Systems  Constitutional: Negative for chills and fever.  HENT: Negative.   Eyes: Negative for visual disturbance.  Respiratory: Negative.   Cardiovascular: Negative.  Negative for chest pain.  Gastrointestinal: Negative.  Negative for abdominal pain, nausea and vomiting.  Musculoskeletal: Positive for back pain and neck pain.  Skin: Negative.   Neurological: Positive for headaches. Negative for syncope.     Physical Exam Updated Vital Signs BP (!) 156/50   Pulse 80   Temp 98.7 F (37.1 C) (Oral)   Resp 16   Ht 5' (1.524 m)   Wt 71.7 kg   SpO2 98%   BMI 30.86 kg/m   Physical Exam  Constitutional: She is oriented to person, place, and time. She appears well-developed and well-nourished.  HENT:  Head: Normocephalic.  Occipital contusion without abrasion, laceration or bleed.  Eyes: Pupils are equal, round, and reactive to light.  Neck: Normal range of motion. Neck supple.  Cardiovascular: Normal rate and regular rhythm.  No murmur heard. Pulmonary/Chest: Effort normal and breath sounds normal. She exhibits no tenderness.  Abdominal: Soft. Bowel sounds are normal. There is no tenderness. There is no rebound and no guarding.  Musculoskeletal: Normal range of motion.  No midline spinal, pelvic, hip or LE tenderness. Moves all  extremities with FROM. Soft tissue swelling posterior left elbow without pain and full ROM.   Neurological: She is alert and oriented to person, place, and time. She has normal strength. She is not disoriented. Coordination normal. GCS eye subscore is 4. GCS verbal subscore is 5. GCS motor subscore is 6.  Patient is ambulated by me and has no dizziness on standing. Walks without difficulty and is balanced.   Skin: Skin is warm and dry. No rash noted.  Psychiatric: She has a normal mood and affect.     ED Treatments / Results  Labs (all labs ordered are listed, but only abnormal results are displayed) Labs Reviewed  CBG MONITORING, ED - Abnormal; Notable for the following components:      Result Value   Glucose-Capillary 239 (*)    All other components within normal limits    EKG None  Radiology Dg Chest 2 View  Result Date: 11/15/2017 CLINICAL DATA:  Acute pain following fall today. EXAM: CHEST - 2 VIEW COMPARISON:  11/26/2016 FINDINGS: The cardiomediastinal silhouette is unremarkable. Elevation of the RIGHT hemidiaphragm again noted. There is no evidence of focal airspace disease, pulmonary edema, suspicious pulmonary nodule/mass, pleural effusion, or pneumothorax. No acute bony abnormalities are identified. LOWER cervical spine fusion changes again noted IMPRESSION: No active cardiopulmonary disease. Electronically Signed   By: Margarette Canada M.D.   On: 11/15/2017 20:28   Ct Head Wo Contrast  Result Date: 11/15/2017 CLINICAL DATA:  Head injury after fall at home today. EXAM: CT HEAD WITHOUT CONTRAST CT CERVICAL SPINE WITHOUT CONTRAST TECHNIQUE: Multidetector CT imaging of the head and cervical spine was performed following the standard protocol without intravenous contrast. Multiplanar CT image reconstructions of the cervical spine were also generated. COMPARISON:  CT scan of January 08, 2013. FINDINGS: CT HEAD FINDINGS Brain: Mild chronic ischemic white matter disease is noted. No mass  effect or midline shift is noted. Ventricular size is within normal limits. There is no evidence of mass lesion, hemorrhage or acute infarction. Vascular: No hyperdense vessel or unexpected calcification. Skull: Normal. Negative for fracture or focal lesion. Sinuses/Orbits: No acute finding. Other: None. CT CERVICAL SPINE FINDINGS Alignment: Normal. Skull base and vertebrae: No acute fracture. No primary bone lesion or  focal pathologic process. Soft tissues and spinal canal: No prevertebral fluid or swelling. No visible canal hematoma. Disc levels: Moderate degenerative disc disease is noted at C3-4 and C5-6. Status post surgical anterior fusion of C6-7. Upper chest: Negative. Other: Degenerative changes are seen involving posterior facet joints bilaterally. IMPRESSION: Mild chronic ischemic white matter disease. No acute intracranial abnormality seen. Multilevel degenerative disc disease is noted. Status post surgical anterior fusion of C6-7. No acute abnormality seen in the cervical spine. Electronically Signed   By: Marijo Conception, M.D.   On: 11/15/2017 20:22   Ct Cervical Spine Wo Contrast  Result Date: 11/15/2017 CLINICAL DATA:  Head injury after fall at home today. EXAM: CT HEAD WITHOUT CONTRAST CT CERVICAL SPINE WITHOUT CONTRAST TECHNIQUE: Multidetector CT imaging of the head and cervical spine was performed following the standard protocol without intravenous contrast. Multiplanar CT image reconstructions of the cervical spine were also generated. COMPARISON:  CT scan of January 08, 2013. FINDINGS: CT HEAD FINDINGS Brain: Mild chronic ischemic white matter disease is noted. No mass effect or midline shift is noted. Ventricular size is within normal limits. There is no evidence of mass lesion, hemorrhage or acute infarction. Vascular: No hyperdense vessel or unexpected calcification. Skull: Normal. Negative for fracture or focal lesion. Sinuses/Orbits: No acute finding. Other: None. CT CERVICAL SPINE  FINDINGS Alignment: Normal. Skull base and vertebrae: No acute fracture. No primary bone lesion or focal pathologic process. Soft tissues and spinal canal: No prevertebral fluid or swelling. No visible canal hematoma. Disc levels: Moderate degenerative disc disease is noted at C3-4 and C5-6. Status post surgical anterior fusion of C6-7. Upper chest: Negative. Other: Degenerative changes are seen involving posterior facet joints bilaterally. IMPRESSION: Mild chronic ischemic white matter disease. No acute intracranial abnormality seen. Multilevel degenerative disc disease is noted. Status post surgical anterior fusion of C6-7. No acute abnormality seen in the cervical spine. Electronically Signed   By: Marijo Conception, M.D.   On: 11/15/2017 20:22    Procedures Procedures (including critical care time)  Medications Ordered in ED Medications - No data to display   Initial Impression / Assessment and Plan / ED Course  I have reviewed the triage vital signs and the nursing notes.  Pertinent labs & imaging results that were available during my care of the patient were reviewed by me and considered in my medical decision making (see chart for details).     Patient is an 84 yo who had a mechanical fall earlier this evening, falling backward into tub. Minor head injury without evidence of intracranial injury. No acute neck injury.   She remains awake and alert after extended ED encounter of over 4 hours. She ambulates well. She expresses that she wants to go home. Not anticoagulated.  She is felt appropriate for discharge home. Recommend close follow up primary care.   Final Clinical Impressions(s) / ED Diagnoses   Final diagnoses:  None   1. Mechanical fall 2. Scalp contusion   ED Discharge Orders    None       Charlann Lange, PA-C 11/16/17 0011    Fatima Blank, MD 11/17/17 [phone]

## 2017-11-15 NOTE — ED Triage Notes (Addendum)
Pt reports falling into her bathtub today after standing on a chair and trying to hang something on the curtain rod. Pt reports falling into the tub flat on her back and hit the back right of her head. Denies being on blood thinners. Pt reports slight headache and soreness to bilateral knees and shoulders. Hx of htn and pt is compliant with taking htn medication. Pt ambulatory in triage.

## 2017-11-15 NOTE — ED Provider Notes (Signed)
Patient placed in Quick Look pathway, seen and evaluated   Chief Complaint: Fall, headache  HPI:   She presents today for evaluation after a fall.  She was standing on a chair trying to hang a sweater on the shower curtain rod when it moved and she fell into the tub on her back.  She denies any blood thinner.  She says she struck the back of her head on the tub.  She reports pain primarily in her head.  Denies neck pain.  She reports mild soreness to bilateral knees and shoulders.  She is ambulatory here without limp.  ROS: No loss of consciousness (one)  Physical Exam:   Gen: No distress  Neuro: Awake and Alert  Skin: Warm    Focused Exam: Patient is awake and alert, interacts appropriately.     Initiation of care has begun. The patient has been counseled on the process, plan, and necessity for staying for the completion/evaluation, and the remainder of the medical screening examination    Ollen Gross 11/15/17 Bartholomew Boards, MD 11/18/17 2136

## 2017-11-16 NOTE — Discharge Instructions (Signed)
Recommend Tylenol for pain as needed. Cool compresses to the sore areas. Follow up with your doctor for recheck as needed and return to the emergency department with any new or concerning symptoms.

## 2018-03-03 ENCOUNTER — Ambulatory Visit: Payer: Medicare HMO | Admitting: Pulmonary Disease

## 2018-03-08 ENCOUNTER — Ambulatory Visit: Payer: Medicare HMO | Admitting: Pulmonary Disease

## 2018-03-08 ENCOUNTER — Encounter: Payer: Self-pay | Admitting: Pulmonary Disease

## 2018-03-08 VITALS — BP 110/70 | HR 72 | Ht 60.0 in | Wt 156.8 lb

## 2018-03-08 DIAGNOSIS — M349 Systemic sclerosis, unspecified: Secondary | ICD-10-CM

## 2018-03-08 DIAGNOSIS — R911 Solitary pulmonary nodule: Secondary | ICD-10-CM

## 2018-03-08 DIAGNOSIS — J849 Interstitial pulmonary disease, unspecified: Secondary | ICD-10-CM
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
## 2018-03-08 NOTE — Patient Instructions (Addendum)
Thank you for visiting Dr. Valeta Harms at [facility]. Today we recommend the following: Orders Placed This Encounter  Procedures  . CT Chest High Resolution  . Pulmonary function test    Return in about 2 months (around 05/07/2018). with Praveen Mannam in the ILD Clinic.

## 2018-03-09 ENCOUNTER — Telehealth: Payer: Self-pay | Admitting: Pulmonary Disease

## 2018-03-09 NOTE — Telephone Encounter (Signed)
Left message for patient to call back to get more information.

## 2018-03-10 NOTE — Telephone Encounter (Signed)
Spoke with pt, she states she wanted to let BI now that she was diagnosed with scleroderma. I advised her that her last OV was coded with that diagnosis so I was sure BI knows. I advised her that I would send BI the message for FYI.

## 2018-03-10 NOTE — Telephone Encounter (Signed)
Patient returning phone call.  Phone number is [phone].

## 2018-03-10 NOTE — Telephone Encounter (Signed)
PCCM:  Yes, we spent nearly all of her visit discussing this diagnosis as well as the reason why we are obtaining further imaging. Thank you for speaking with her.   [facility] 03/10/2018 11:21 AM

## 2018-03-17 ENCOUNTER — Inpatient Hospital Stay (HOSPITAL_COMMUNITY)
Admission: EM | Admit: 2018-03-17 | Discharge: 2018-03-20 | DRG: 077 | Disposition: A | Discharge status: Home / Self Care | Payer: Medicare HMO | Attending: Internal Medicine | Admitting: Internal Medicine

## 2018-03-17 ENCOUNTER — Emergency Department (HOSPITAL_COMMUNITY): Payer: Medicare HMO

## 2018-03-17 DIAGNOSIS — F329 Major depressive disorder, single episode, unspecified: Secondary | ICD-10-CM | POA: Diagnosis present

## 2018-03-17 DIAGNOSIS — I639 Cerebral infarction, unspecified: Secondary | ICD-10-CM

## 2018-03-17 DIAGNOSIS — N184 Chronic kidney disease, stage 4 (severe): Secondary | ICD-10-CM | POA: Diagnosis present

## 2018-03-17 DIAGNOSIS — R29810 Facial weakness: Secondary | ICD-10-CM | POA: Diagnosis present

## 2018-03-17 DIAGNOSIS — I674 Hypertensive encephalopathy: Secondary | ICD-10-CM | POA: Diagnosis not present

## 2018-03-17 DIAGNOSIS — I7389 Other specified peripheral vascular diseases: Secondary | ICD-10-CM | POA: Diagnosis present

## 2018-03-17 DIAGNOSIS — E1122 Type 2 diabetes mellitus with diabetic chronic kidney disease: Secondary | ICD-10-CM | POA: Diagnosis present

## 2018-03-17 DIAGNOSIS — R471 Dysarthria and anarthria: Secondary | ICD-10-CM | POA: Diagnosis present

## 2018-03-17 DIAGNOSIS — I4892 Unspecified atrial flutter: Secondary | ICD-10-CM | POA: Diagnosis present

## 2018-03-17 DIAGNOSIS — Z794 Long term (current) use of insulin: Secondary | ICD-10-CM

## 2018-03-17 DIAGNOSIS — N179 Acute kidney failure, unspecified: Secondary | ICD-10-CM | POA: Diagnosis not present

## 2018-03-17 DIAGNOSIS — Z833 Family history of diabetes mellitus: Secondary | ICD-10-CM

## 2018-03-17 DIAGNOSIS — Z91128 Patient's intentional underdosing of medication regimen for other reason: Secondary | ICD-10-CM

## 2018-03-17 DIAGNOSIS — N183 Chronic kidney disease, stage 3 unspecified: Secondary | ICD-10-CM | POA: Diagnosis present

## 2018-03-17 DIAGNOSIS — Z90710 Acquired absence of both cervix and uterus: Secondary | ICD-10-CM

## 2018-03-17 DIAGNOSIS — I2510 Atherosclerotic heart disease of native coronary artery without angina pectoris: Secondary | ICD-10-CM | POA: Diagnosis present

## 2018-03-17 DIAGNOSIS — Z8673 Personal history of transient ischemic attack (TIA), and cerebral infarction without residual deficits: Secondary | ICD-10-CM

## 2018-03-17 DIAGNOSIS — R402364 Coma scale, best motor response, obeys commands, 24 hours or more after hospital admission: Secondary | ICD-10-CM | POA: Diagnosis present

## 2018-03-17 DIAGNOSIS — I10 Essential (primary) hypertension: Secondary | ICD-10-CM | POA: Diagnosis present

## 2018-03-17 DIAGNOSIS — T466X6A Underdosing of antihyperlipidemic and antiarteriosclerotic drugs, initial encounter: Secondary | ICD-10-CM | POA: Diagnosis present

## 2018-03-17 DIAGNOSIS — I4891 Unspecified atrial fibrillation: Secondary | ICD-10-CM | POA: Diagnosis present

## 2018-03-17 DIAGNOSIS — R9082 White matter disease, unspecified: Secondary | ICD-10-CM | POA: Diagnosis present

## 2018-03-17 DIAGNOSIS — Z6830 Body mass index (BMI) 30.0-30.9, adult: Secondary | ICD-10-CM

## 2018-03-17 DIAGNOSIS — I129 Hypertensive chronic kidney disease with stage 1 through stage 4 chronic kidney disease, or unspecified chronic kidney disease: Secondary | ICD-10-CM | POA: Diagnosis present

## 2018-03-17 DIAGNOSIS — Z79899 Other long term (current) drug therapy: Secondary | ICD-10-CM

## 2018-03-17 DIAGNOSIS — M8580 Other specified disorders of bone density and structure, unspecified site: Secondary | ICD-10-CM | POA: Diagnosis present

## 2018-03-17 DIAGNOSIS — R402144 Coma scale, eyes open, spontaneous, 24 hours or more after hospital admission: Secondary | ICD-10-CM | POA: Diagnosis present

## 2018-03-17 DIAGNOSIS — Z888 Allergy status to other drugs, medicaments and biological substances status: Secondary | ICD-10-CM

## 2018-03-17 DIAGNOSIS — Z7982 Long term (current) use of aspirin: Secondary | ICD-10-CM

## 2018-03-17 DIAGNOSIS — E876 Hypokalemia: Secondary | ICD-10-CM | POA: Diagnosis present

## 2018-03-17 DIAGNOSIS — T383X6A Underdosing of insulin and oral hypoglycemic [antidiabetic] drugs, initial encounter: Secondary | ICD-10-CM | POA: Diagnosis present

## 2018-03-17 DIAGNOSIS — F418 Other specified anxiety disorders: Secondary | ICD-10-CM | POA: Diagnosis present

## 2018-03-17 DIAGNOSIS — R402254 Coma scale, best verbal response, oriented, 24 hours or more after hospital admission: Secondary | ICD-10-CM | POA: Diagnosis present

## 2018-03-17 DIAGNOSIS — E669 Obesity, unspecified: Secondary | ICD-10-CM | POA: Diagnosis present

## 2018-03-17 DIAGNOSIS — E11319 Type 2 diabetes mellitus with unspecified diabetic retinopathy without macular edema: Secondary | ICD-10-CM | POA: Diagnosis present

## 2018-03-17 DIAGNOSIS — Z981 Arthrodesis status: Secondary | ICD-10-CM

## 2018-03-17 DIAGNOSIS — R4781 Slurred speech: Secondary | ICD-10-CM | POA: Diagnosis not present

## 2018-03-17 DIAGNOSIS — E785 Hyperlipidemia, unspecified: Secondary | ICD-10-CM | POA: Diagnosis present

## 2018-03-17 DIAGNOSIS — G8194 Hemiplegia, unspecified affecting left nondominant side: Secondary | ICD-10-CM | POA: Diagnosis present

## 2018-03-17 DIAGNOSIS — K219 Gastro-esophageal reflux disease without esophagitis: Secondary | ICD-10-CM | POA: Diagnosis present

## 2018-03-17 DIAGNOSIS — F419 Anxiety disorder, unspecified: Secondary | ICD-10-CM | POA: Diagnosis present

## 2018-03-17 DIAGNOSIS — Z9841 Cataract extraction status, right eye: Secondary | ICD-10-CM

## 2018-03-17 DIAGNOSIS — Z825 Family history of asthma and other chronic lower respiratory diseases: Secondary | ICD-10-CM

## 2018-03-17 DIAGNOSIS — I5181 Takotsubo syndrome: Secondary | ICD-10-CM | POA: Diagnosis present

## 2018-03-17 DIAGNOSIS — G9340 Encephalopathy, unspecified: Secondary | ICD-10-CM | POA: Diagnosis present

## 2018-03-17 DIAGNOSIS — D631 Anemia in chronic kidney disease: Secondary | ICD-10-CM | POA: Diagnosis present

## 2018-03-17 DIAGNOSIS — Z803 Family history of malignant neoplasm of breast: Secondary | ICD-10-CM

## 2018-03-17 DIAGNOSIS — Z9842 Cataract extraction status, left eye: Secondary | ICD-10-CM

## 2018-03-17 DIAGNOSIS — R200 Anesthesia of skin: Secondary | ICD-10-CM | POA: Diagnosis present

## 2018-03-17 LAB — COMPREHENSIVE METABOLIC PANEL
ALT: 18 U/L (ref 0–44)
ANION GAP: 12 (ref 5–15)
AST: 22 U/L (ref 15–41)
Albumin: 4 g/dL (ref 3.5–5.0)
Alkaline Phosphatase: 62 U/L (ref 38–126)
BUN: 34 mg/dL — ABNORMAL HIGH (ref 8–23)
CALCIUM: 9.3 mg/dL (ref 8.9–10.3)
CO2: 22 mmol/L (ref 22–32)
Chloride: 103 mmol/L (ref 98–111)
Creatinine, Ser: 1.83 mg/dL — ABNORMAL HIGH (ref 0.44–1.00)
GFR, EST AFRICAN AMERICAN: 29 mL/min — AB (ref >60)
GFR, EST NON AFRICAN AMERICAN: 25 mL/min — AB (ref >60)
GLUCOSE: 365 mg/dL — AB (ref 70–99)
POTASSIUM: 4.3 mmol/L (ref 3.5–5.1)
SODIUM: 137 mmol/L (ref 135–145)
TOTAL PROTEIN: 7.2 g/dL (ref 6.5–8.1)
Total Bilirubin: 0.8 mg/dL (ref 0.3–1.2)

## 2018-03-17 LAB — CBC
HCT: 40.8 % (ref 36.0–46.0)
HEMOGLOBIN: 13.4 g/dL (ref 12.0–15.0)
MCH: 32.5 pg (ref 26.0–34.0)
MCHC: 32.8 g/dL (ref 30.0–36.0)
MCV: 99 fL (ref 80.0–100.0)
NRBC: 0 % (ref 0.0–0.2)
PLATELETS: 251 10*3/uL (ref 150–400)
RBC: 4.12 MIL/uL (ref 3.87–5.11)
RDW: 11.9 % (ref 11.5–15.5)
WBC: 6.6 10*3/uL (ref 4.0–10.5)

## 2018-03-17 LAB — PROTIME-INR
INR: 1.1 (ref 0.8–1.2)
Prothrombin Time: 13.6 seconds (ref 11.4–15.2)

## 2018-03-17 LAB — DIFFERENTIAL
Abs Immature Granulocytes: 0.02 10*3/uL (ref 0.00–0.07)
BASOS ABS: 0 10*3/uL (ref 0.0–0.1)
BASOS PCT: 0 %
EOS ABS: 0.1 10*3/uL (ref 0.0–0.5)
Eosinophils Relative: 1 %
IMMATURE GRANULOCYTES: 0 %
Lymphocytes Relative: 41 %
Lymphs Abs: 2.7 10*3/uL (ref 0.7–4.0)
MONOS PCT: 10 %
Monocytes Absolute: 0.7 10*3/uL (ref 0.1–1.0)
NEUTROS PCT: 48 %
Neutro Abs: 3.1 10*3/uL (ref 1.7–7.7)

## 2018-03-17 LAB — I-STAT CREATININE, ED: CREATININE: 1.9 mg/dL — AB (ref 0.44–1.00)

## 2018-03-17 LAB — APTT: APTT: 24 s (ref 24–36)

## 2018-03-17 MED ORDER — SODIUM CHLORIDE 0.9% FLUSH
3.0000 mL | Freq: Once | INTRAVENOUS | Status: AC
Start: 2018-03-17 — End: 2018-03-18
  Administered 2018-03-18: 3 mL via INTRAVENOUS

## 2018-03-17 MED ORDER — CLEVIDIPINE BUTYRATE 0.5 MG/ML IV EMUL
0.0000 mg/h | INTRAVENOUS | Status: DC
Start: 2018-03-17 — End: 2018-03-17
  Administered 2018-03-17: 1 mg/h via INTRAVENOUS

## 2018-03-17 NOTE — Consult Note (Addendum)
Referring Physician: Dr. Tyrone Nine    Chief Complaint: Intermittent slurred speech and left sided weakness.   HPI: Sandra [name] is an 85 y.o. female who presented via EMS with acute onset of intermittently slurred speech and left sided weakness/numbness, as well as some right sided facial droop. [facility], which was the same as TOSO. Symptoms resolved at about 1925, then recurred at 1935 en route. Code Stroke was called in the field. BP per EMS was 214/101. On arrival the patient was still symptomatic, with BP of 222/73 and anxious, tearful affect. She complained of a right supraorbital 8/10 nonthrobbing headache in CT. CT head was negative for acute abnormality. Her symptoms resolved after management of her BP with clevidipine drip. The patient's CBG was elevated in the 400's. She states that she had not taken her medications over the past 2 days due to the stress of taking care of her ill sister. Missed medications included her antihypertensives and insulin. She takes ASA on an irregular basis.   She states that she had a TIA in 2012 but cannot recall what the deficits were.   Her PMHx includes multiple stroke risk factors as well as SAH.   LSN: [id number] tPA Given: No: Overall clinical picture most consistent with hypertensive urgency. Symptoms resolved with BP management.   Past Medical History:  Diagnosis Date  . Acid reflux   . Acute MI ([place])   . Allergic rhinitis   . Anxiety   . Degenerative joint disease   . Depression   . Diabetes mellitus    type 2  . Gait disturbance   . GERD (gastroesophageal reflux disease)   . Hyperlipidemia   . Hyperlipidemia   . Hypertension   . Neck pain, chronic   . Osteopenia   . SAH (subarachnoid hemorrhage) ([place])   . Sciatica   . Takotsubo cardiomyopathy    with subsequent normalization of left ventricular systolic function  . TIA (transient ischemic attack)   . Valvular heart disease     Past Surgical History:  Procedure Laterality Date  .  ABDOMINAL HYSTERECTOMY  1977   no oophorectomy  . APPENDECTOMY    . CARDIAC CATHETERIZATION    . CATARACT EXTRACTION, BILATERAL  2011  . SPINAL FUSION  2000   Dr. Vertell Limber  . TONSILLECTOMY      Family History  Problem Relation Age of Onset  . Intracerebral hemorrhage Daughter        assoc with post-partum  . Diabetes Sister   . Asthma Sister   . Diabetes Sister   . Breast cancer Other        ?Aunt  . Cancer Other        breast?  . Coronary artery disease Neg Hx    Social History:  reports that she is a non-smoker but has been exposed to tobacco smoke. She has never used smokeless tobacco. She reports that she does not drink alcohol or use drugs.  Allergies:  Allergies  Allergen Reactions  . Cephalexin Nausea And Vomiting    Pt stated made severely sick, will never take again  . Pioglitazone     REACTION: EDEMA    Home Medications:    ROS: Positive for headache. No vision loss. No facial numbness or weakness. No CP, SOB or fever. No vomiting. No limb pain. Other ROS as per HPI with all other systems negative.   Physical Examination: Height 5' (1.524 m), weight 69.7 kg.  HEENT: [place]/AT Lungs: Respirations unlabored Ext: No edema  Neurologic Examination: Mental Status: Alert and fully oriented. Anxious affect with tearfulness at times, improving throughout the exam.  Speech fluent with intact comprehension, repetition and naming. Has waxing and waning mild dysarthria. Had difficulty with one component of a 3-step directional command.  Cranial Nerves: II:  Visual fields intact with each eye tested individually. No extinction to DSS. PERRL.  III,IV, VI: EOMI without nystagmus. No ptosis.  V,VII: Smile and grimace are symmetric. Facial temp sensation equal bilaterally VIII: hearing intact to voice IX,X: Palate rises symmetrically XI: Symmetric shoulder shrug XII: midline tongue extension  Motor: Right : Upper extremity   5/5    Left:     Upper extremity   5/5  Lower  extremity   5/5     Lower extremity   5/5 No pronator drift.  Sensory: Temp and light touch intact in all 4 extremities when tested individually. There is left sided extinction with DSS.  Deep Tendon Reflexes:  2+ bilateral brachioradialis and biceps 3+ patellae bilaterally.  1+ achilles bilaterally Plantars: Right: downgoing   Left: downgoing Cerebellar: No ataxia with FNF or H-S bilaterally  Gait: Deferred   No results found for this or any previous visit (from the past 48 hour(s)). No results found.  Assessment: 85 y.o. female presenting with dysarthria in conjunction with left sided sensory and motor symptoms 1. Overall presentation most consistent with hypertensive urgency. Transient/fluctuating neurological symptoms are most likely secondary to small vessel vasospasm with focal ischemia 2. Symptoms resolved after SBP brought into the 160-170 range with clevidipine drip 3. Stroke Risk Factors - CAD, HTN, DM2, HLD and prior TIA  Plan: 1. MRI brain. If positive for stroke, obtain remainder of stroke work up. 2. Clevidipine gtt with SBP goal of < 160 3. Encouraged compliance with medications from now on. Patient seems to need to have this re-clarified to her.  4. PT consult, OT consult, Speech consult 5. Continue ASA and statin 6. Telemetry monitoring 7. Frequent neuro checks  50 minutes spent in the emergent neurological evaluation and management of this critically ill patient  @Electronically  signed: Dr. Kerney Elbe  03/17/2018, 9:04 PM

## 2018-03-17 NOTE — ED Provider Notes (Signed)
[facility] EMERGENCY DEPARTMENT Provider Note   CSN: [id number] Arrival date & time: 03/17/18  2054  An emergency department physician performed an initial assessment on this suspected stroke patient at 2052.  History   Chief Complaint No chief complaint on file.   HPI Sandra [name] is a 85 y.o. female.     85 yo F with a chief complaints of episodic slurred speech and left-sided weakness.  Per the daughter she also had some right-sided facial droop.  Arrived as a code stroke.  Level 5 caveat acuity of condition.  The history is provided by the patient.  Illness  Severity:  Mild Onset quality:  Sudden Duration:  1 hour Timing:  Constant Progression:  Waxing and waning Chronicity:  New Associated symptoms: no chest pain, no congestion, no fever, no headaches, no myalgias, no nausea, no rhinorrhea, no shortness of breath, no vomiting and no wheezing     Past Medical History:  Diagnosis Date  . Acid reflux   . Acute MI ([place])   . Allergic rhinitis   . Anxiety   . Degenerative joint disease   . Depression   . Diabetes mellitus    type 2  . Gait disturbance   . GERD (gastroesophageal reflux disease)   . Hyperlipidemia   . Hyperlipidemia   . Hypertension   . Neck pain, chronic   . Osteopenia   . SAH (subarachnoid hemorrhage) ([place])   . Sciatica   . Takotsubo cardiomyopathy    with subsequent normalization of left ventricular systolic function  . TIA (transient ischemic attack)   . Valvular heart disease     Patient Active Problem List   Diagnosis Date Noted  . Acute encephalopathy 03/17/2018  . Scleroderma ([place]) 02/28/2016  . Dyspnea 02/10/2016  . Abnormal blood finding 02/10/2016  . CAD (coronary artery disease) 02/10/2016  . Osteoarthritis 02/10/2016  . Nodule on liver 12/06/2015  . ILD (interstitial lung disease) ([place]) 05/31/2015  . Type 2 diabetes mellitus with hyperglycemia ([place]) 03/11/2015  . Diabetes mellitus type 2 with retinopathy  ([place]) 03/04/2015  . Vitamin D deficiency 10/01/2014  . Leukocytopenia 05/31/2014  . Chronic kidney disease (CKD), stage III (moderate) ([place]) 05/31/2014  . Fatigue 05/31/2014  . Lower abdominal pain 05/17/2014  . Nausea with vomiting 09/22/2013  . Chronic right SI joint pain 09/22/2013  . 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 Abrasion of ear canal 09/28/2012  . History of subarachnoid hemorrhage 04/07/2012  . Unspecified cerebral artery occlusion with cerebral infarction 04/07/2012  . UTI (urinary tract infection) 11/02/2011  . General medical examination 01/07/2011  . Chest pain 11/27/2010  . Achilles tendon injury 11/27/2010  . Back pain 07/01/2010  . Breast pain 04/15/2010  . GAIT DISTURBANCE 08/07/2009  . Anxiety state 07/03/2009  . VALVULAR HEART DISEASE 05/23/2008  . NECK PAIN, CHRONIC 04/03/2008  . DEGENERATIVE JOINT DISEASE, CERVICAL SPINE 06/23/2006  . Osteoporosis 06/23/2006  . Hyperlipidemia associated with type 2 diabetes mellitus ([place]) 05/12/2006  . DEPRESSION 05/12/2006  . Essential hypertension 05/12/2006  . ALLERGIC RHINITIS 05/12/2006  . ACID REFLUX DISEASE 05/12/2006  . SCIATICA 05/12/2006    Past Surgical History:  Procedure Laterality Date  . ABDOMINAL HYSTERECTOMY  1977   no oophorectomy  . APPENDECTOMY    . CARDIAC CATHETERIZATION    . CATARACT EXTRACTION, BILATERAL  2011  . SPINAL FUSION  2000   Dr. Vertell Limber  . TONSILLECTOMY       OB History   No obstetric history on  file.      Home Medications    Prior to Admission medications   Medication Sig Start Date End Date Taking? Authorizing Provider  Albuterol Sulfate (PROAIR RESPICLICK) 003 (90 Base) MCG/ACT AEPB Inhale 1-2 puffs into the lungs every 6 (six) hours as needed. Patient taking differently: Inhale 1-2 puffs into the lungs every 6 (six) hours as needed (SOB).  09/14/16  Yes Noralee Space, MD  aspirin 81 MG tablet Take 81 mg by mouth daily.   Yes [provider]  atorvastatin  (LIPITOR) 20 MG tablet TAKE 1 TABLET EVERY DAY Patient taking differently: Take 20 mg by mouth daily.  10/20/16  Yes Midge Minium, MD  clonazePAM (KLONOPIN) 0.5 MG tablet TAKE 1 TABLET TWICE DAILY AS NEEDED FOR ANXIETY Patient taking differently: Take 0.5 mg by mouth 2 (two) times daily as needed for anxiety.  10/08/16  Yes Midge Minium, MD  Cyanocobalamin (VITAMIN B-12 PO) Take 1 tablet by mouth daily.    Yes [provider]  famotidine (PEPCID) 40 MG tablet Take 40 mg by mouth daily.  11/15/17 11/15/18 Yes [provider]  fenofibrate 160 MG tablet TAKE 1 TABLET EVERY DAY Patient taking differently: Take 160 mg by mouth daily.  01/26/17  Yes Midge Minium, MD  Fluticasone-Salmeterol (ADVAIR DISKUS) 250-50 MCG/DOSE AEPB Inhale 1 puff into the lungs 2 (two) times daily. 03/17/16  Yes Noralee Space, MD  glimepiride (AMARYL) 4 MG tablet Take 2 mg by mouth 2 (two) times daily.  02/19/14  Yes [provider]  insulin regular (NOVOLIN R,HUMULIN R) 250 units/2.63mL (100 units/mL) injection 27 Units 3 (three) times daily before meals. 24 units TID before meals 10/11/15  Yes [provider]  lisinopril (PRINIVIL,ZESTRIL) 10 MG tablet Take 10 mg by mouth daily.    Yes [provider]  metoprolol succinate (TOPROL-XL) 50 MG 24 hr tablet TAKE 1 TABLET EVERY DAY.  TAKE  WITH  OR  IMMEDIATELY FOLLOWING A MEAL  Patient taking differently: Take 50 mg by mouth daily.  11/04/16  Yes Midge Minium, MD  NOVOLIN N RELION 100 UNIT/ML injection Inject 18-40 Units into the skin See admin instructions. 40 units in the morning and 18 units in the evening.   Yes [provider]  ACCU-CHEK FASTCLIX LANCETS [place]  02/04/16   [provider]  ACCU-CHEK SMARTVIEW test strip  02/04/16   [provider]  HYDROcodone-acetaminophen (NORCO/VICODIN) 5-325 MG tablet Take 1 tablet by mouth every 6 (six) hours as needed for moderate pain. Patient not  taking: Reported on 03/17/2018 10/13/16   Midge Minium, MD  Insulin Syringe-Needle U-100 (INSULIN SYRINGE .5CC/31GX5/16") 31G X 5/16" 0.5 ML MISC 40 units in morning and 18units at night 02/04/16   [provider]  tiZANidine (ZANAFLEX) 2 MG tablet Take 1 tablet (2 mg total) by mouth every 8 (eight) hours as needed for muscle spasms. Patient not taking: Reported on 03/17/2018 11/18/16   Midge Minium, MD    Family History Family History  Problem Relation Age of Onset  . Intracerebral hemorrhage Daughter        assoc with post-partum  . Diabetes Sister   . Asthma Sister   . Diabetes Sister   . Breast cancer Other        ?Aunt  . Cancer Other        breast?  . Coronary artery disease Neg Hx     Social History Social History   Tobacco Use  .  Smoking status: Passive Smoke Exposure - Never Smoker  . Smokeless tobacco: Never Used  . Tobacco comment: Mother & Children  Substance Use Topics  . Alcohol use: No    Alcohol/week: 0.0 standard drinks  . Drug use: No     Allergies   Cephalexin and Pioglitazone   Review of Systems Review of Systems  Constitutional: Negative for chills and fever.  HENT: Negative for congestion and rhinorrhea.   Eyes: Negative for redness and visual disturbance.  Respiratory: Negative for shortness of breath and wheezing.   Cardiovascular: Negative for chest pain and palpitations.  Gastrointestinal: Negative for nausea and vomiting.  Genitourinary: Negative for dysuria and urgency.  Musculoskeletal: Negative for arthralgias and myalgias.  Skin: Negative for pallor and wound.  Neurological: Positive for speech difficulty and weakness. Negative for dizziness and headaches.     Physical Exam Updated Vital Signs BP (!) 164/75 (BP Location: Right Arm)   Pulse 89   Temp 98.6 F (37 C) (Oral)   Resp 17   Ht 5' (1.524 m)   Wt 69.7 kg   SpO2 96%   BMI 30.01 kg/m   Physical Exam Vitals signs and nursing note reviewed.    Constitutional:      General: She is not in acute distress.    Appearance: She is well-developed. She is not diaphoretic.  HENT:     Head: Normocephalic and atraumatic.  Eyes:     Pupils: Pupils are equal, round, and reactive to light.  Neck:     Musculoskeletal: Normal range of motion and neck supple.  Cardiovascular:     Rate and Rhythm: Normal rate and regular rhythm.     Heart sounds: No murmur. No friction rub. No gallop.   Pulmonary:     Effort: Pulmonary effort is normal.     Breath sounds: No wheezing or rales.  Abdominal:     General: There is no distension.     Palpations: Abdomen is soft.     Tenderness: There is no abdominal tenderness.  Musculoskeletal:        General: No tenderness.  Skin:    General: Skin is warm and dry.  Neurological:     Mental Status: She is alert and oriented to person, place, and time.     Comments: Mild left sided weakness.  Slurred speech  Psychiatric:        Behavior: Behavior normal.      ED Treatments / Results  Labs (all labs ordered are listed, but only abnormal results are displayed) Labs Reviewed  COMPREHENSIVE METABOLIC PANEL - Abnormal; Notable for the following components:      Result Value   Glucose, Bld 365 (*)    BUN 34 (*)    Creatinine, Ser 1.83 (*)    GFR calc non Af Amer 25 (*)    GFR calc Af Amer 29 (*)    All other components within normal limits  GLUCOSE, CAPILLARY - Abnormal; Notable for the following components:   Glucose-Capillary 274 (*)    All other components within normal limits  I-STAT CREATININE, ED - Abnormal; Notable for the following components:   Creatinine, Ser 1.90 (*)    All other components within normal limits  PROTIME-INR  APTT  CBC  DIFFERENTIAL  CBG MONITORING, ED    EKG EKG Interpretation  Date/Time:  Thursday March 17 2018 21:20:30 EST Ventricular Rate:  95 PR Interval:    QRS Duration: 98 QT Interval:  359 QTC Calculation: 452 R Axis:  52 Text Interpretation:   Sinus rhythm Ventricular premature complex Borderline repolarization abnormality No significant change since last tracing Confirmed by Deno Etienne [phone]) on 03/17/2018 9:24:38 PM   Radiology Ct Head Code Stroke Wo Contrast  Result Date: 03/17/2018 CLINICAL DATA:  Code stroke.  Left facial droop and weakness. EXAM: CT HEAD WITHOUT CONTRAST TECHNIQUE: Contiguous axial images were obtained from the base of the skull through the vertex without intravenous contrast. COMPARISON:  Head CT 11/15/2017 FINDINGS: Brain: There is no mass, hemorrhage or extra-axial collection. The size and configuration of the ventricles and extra-axial CSF spaces are normal. There is hypoattenuation of the periventricular white matter, most commonly indicating chronic ischemic microangiopathy. Unchanged old left basal ganglia lacunar infarcts. Vascular: No abnormal hyperdensity of the major intracranial arteries or dural venous sinuses. No intracranial atherosclerosis. Skull: The visualized skull base, calvarium and extracranial soft tissues are normal. Sinuses/Orbits: No fluid levels or advanced mucosal thickening of the visualized paranasal sinuses. No mastoid or middle ear effusion. The orbits are normal. ASPECTS [facility] Stroke Program Early CT Score) - Ganglionic level infarction (caudate, lentiform nuclei, internal capsule, insula, M1-M3 cortex): 7 - Supraganglionic infarction (M4-M6 cortex): 3 Total score (0-10 with 10 being normal): 10 IMPRESSION: 1. No hemorrhage or other acute finding. 2. ASPECTS is 10. These results were communicated to Dr. Kerney Elbe at 9:10 pm on 03/17/2018 by text page via the [facility] messaging system. Electronically Signed   By: Ulyses Jarred M.D.   On: 03/17/2018 21:11    Procedures Procedures (including critical care time)  Medications Ordered in ED Medications  sodium chloride flush (NS) 0.9 % injection 3 mL (has no administration in time range)     Initial Impression / Assessment and Plan / ED  Course  I have reviewed the triage vital signs and the nursing notes.  Pertinent labs & imaging results that were available during my care of the patient were reviewed by me and considered in my medical decision making (see chart for details).        85 yo F with a cc of transient L sided weakness, R sided facial droop and slurred speech.   Going off on on for the past couple hours.  Patient cleared at the bridge and taken urgently back to CT.  CT negative for ICH, found to be hypertensive and started on cleviprex.  Had complete resolution with BP improvement.  Neuro cancelled code stroke, feel this is hypertensive encephalopathy.  Recommended strict BP control and admission.  MRI.   BP resolved spontaneously here, on lowest dose gtt, will D/c now.  Discuss with hospitalist.   CRITICAL CARE Performed by: Cecilio Asper   Total critical care time: 35 minutes  Critical care time was exclusive of separately billable procedures and treating other patients.  Critical care was necessary to treat or prevent imminent or life-threatening deterioration.  Critical care was time spent personally by me on the following activities: development of treatment plan with patient and/or surrogate as well as nursing, discussions with consultants, evaluation of patient's response to treatment, examination of patient, obtaining history from patient or surrogate, ordering and performing treatments and interventions, ordering and review of laboratory studies, ordering and review of radiographic studies, pulse oximetry and re-evaluation of patient's condition.   The patients results and plan were reviewed and discussed.   Any x-rays performed were independently reviewed by myself.   Differential diagnosis were considered with the presenting HPI.  Medications  sodium chloride flush (NS) 0.9 % injection  3 mL (has no administration in time range)    Vitals:   03/17/18 2200 03/17/18 2258 03/17/18 2330  03/18/18 0008  BP: (!) 173/65 (!) 166/60 (!) 173/87 (!) 164/75  Pulse: 94 (!) 111 97 89  Resp: 18 (!) 22 (!) 23 17  Temp:    98.6 F (37 C)  TempSrc:    Oral  SpO2: 99% 95% 96%   Weight:      Height:        Final diagnoses:  Encephalopathy, hypertensive    Admission/ observation were discussed with the admitting physician, patient and/or family and they are comfortable with the plan.    Final Clinical Impressions(s) / ED Diagnoses   Final diagnoses:  Encephalopathy, hypertensive    ED Discharge Orders    None       Deno Etienne, DO 03/18/18 0015

## 2018-03-17 NOTE — Progress Notes (Signed)
Code stroke called at 2048. Pt with daughter and developed slurred speech, L sided weakness/numbness. On arrival pt NIH 1, LVO negative. BP 222/73, cleviprex started at 2117 and pt symptoms appeared to be resolved with decreased BP. Md belives to be HTN urgency, will get MRI/MRA, echo,carotid dopplers. Code stroke cancelled at 2130.

## 2018-03-17 NOTE — ED Notes (Signed)
ED TO INPATIENT HANDOFF REPORT  Name/Age/Gender Sandra [name] 85 y.o. female  Code Status   Home/SNF/Other Home  Chief Complaint CODE STROKE    Level of Care/Admitting Diagnosis ED Disposition    ED Disposition Condition [place]: [facility] [[id number]]  Level of Care: Progressive [102]  I expect the patient will be discharged within 24 hours: Yes  LOW acuity---Tx typically complete <24 hrs---ACUTE conditions typically can be evaluated <24 hours---LABS likely to return to acceptable levels <24 hours---IS near functional baseline---EXPECTED to return to current living arrangement---NOT newly hypoxic: Does not meet criteria for 5C-Observation unit  Diagnosis: Acute encephalopathy [794801]  Admitting Physician: Vianne Bulls [[id number]]  Attending Physician: Vianne Bulls [[id number]]  PT Class (Do Not Modify): Observation [104]  PT Acc Code (Do Not Modify): Observation [10022]       Medical History Past Medical History:  Diagnosis Date  . Acid reflux   . Acute MI ([place])   . Allergic rhinitis   . Anxiety   . Degenerative joint disease   . Depression   . Diabetes mellitus    type 2  . Gait disturbance   . GERD (gastroesophageal reflux disease)   . Hyperlipidemia   . Hyperlipidemia   . Hypertension   . Neck pain, chronic   . Osteopenia   . SAH (subarachnoid hemorrhage) ([place])   . Sciatica   . Takotsubo cardiomyopathy    with subsequent normalization of left ventricular systolic function  . TIA (transient ischemic attack)   . Valvular heart disease     Allergies Allergies  Allergen Reactions  . Cephalexin Nausea And Vomiting    Pt stated made severely sick, will never take again  . Pioglitazone Other (See Comments)    REACTION: EDEMA    IV Location/Drains/Wounds Patient Lines/Drains/Airways Status   Active Line/Drains/Airways    Name:   Placement date:   Placement time:   Site:   Days:   Peripheral IV 03/17/18 Left  Antecubital   03/17/18    2104    Antecubital   less than 1          Labs/Imaging Results for orders placed or performed during the hospital encounter of 03/17/18 (from the past 48 hour(s))  Protime-INR     Status: None   Collection Time: 03/17/18  9:07 PM  Result Value Ref Range   Prothrombin Time 13.6 11.4 - 15.2 seconds   INR 1.1 0.8 - 1.2    Comment: (NOTE) INR goal varies based on device and disease states. Performed at [facility], [place] [address], [place], [place] [address]   APTT     Status: None   Collection Time: 03/17/18  9:07 PM  Result Value Ref Range   aPTT 24 24 - 36 seconds    Comment: Performed at [facility] [address], [place] [address]  CBC     Status: None   Collection Time: 03/17/18  9:07 PM  Result Value Ref Range   WBC 6.6 4.0 - 10.5 K/uL   RBC 4.12 3.87 - 5.11 MIL/uL   Hemoglobin 13.4 12.0 - 15.0 g/dL   HCT 40.8 36.0 - 46.0 %   MCV 99.0 80.0 - 100.0 fL   MCH 32.5 26.0 - 34.0 pg   MCHC 32.8 30.0 - 36.0 g/dL   RDW 11.9 11.5 - 15.5 %   Platelets 251 150 - 400 K/uL   nRBC 0.0 0.0 - 0.2 %  Comment: Performed at [facility], [place] [address] [place], [place] [address]  Differential     Status: None   Collection Time: 03/17/18  9:07 PM  Result Value Ref Range   Neutrophils Relative % 48 %   Neutro Abs 3.1 1.7 - 7.7 K/uL   Lymphocytes Relative 41 %   Lymphs Abs 2.7 0.7 - 4.0 K/uL   Monocytes Relative 10 %   Monocytes Absolute 0.7 0.1 - 1.0 K/uL   Eosinophils Relative 1 %   Eosinophils Absolute 0.1 0.0 - 0.5 K/uL   Basophils Relative 0 %   Basophils Absolute 0.0 0.0 - 0.1 K/uL   Immature Granulocytes 0 %   Abs Immature Granulocytes 0.02 0.00 - 0.07 K/uL    Comment: Performed at [facility], [place] [address] [place], [place] [address]  Comprehensive metabolic panel     Status: Abnormal   Collection Time: 03/17/18  9:07 PM  Result Value Ref Range   Sodium 137 135 - 145 mmol/L   Potassium 4.3 3.5 - 5.1  mmol/L   Chloride 103 98 - 111 mmol/L   CO2 22 22 - 32 mmol/L   Glucose, Bld 365 (H) 70 - 99 mg/dL   BUN 34 (H) 8 - 23 mg/dL   Creatinine, Ser 1.83 (H) 0.44 - 1.00 mg/dL   Calcium 9.3 8.9 - 10.3 mg/dL   Total Protein 7.2 6.5 - 8.1 g/dL   Albumin 4.0 3.5 - 5.0 g/dL   AST 22 15 - 41 U/L   ALT 18 0 - 44 U/L   Alkaline Phosphatase 62 38 - 126 U/L   Total Bilirubin 0.8 0.3 - 1.2 mg/dL   GFR calc non Af Amer 25 (L) >60 mL/min   GFR calc Af Amer 29 (L) >60 mL/min   Anion gap 12 5 - 15    Comment: Performed at [facility] [address], [place], [place] [address]  I-stat Creatinine, ED     Status: Abnormal   Collection Time: 03/17/18  9:17 PM  Result Value Ref Range   Creatinine, Ser 1.90 (H) 0.44 - 1.00 mg/dL   Ct Head Code Stroke Wo Contrast  Result Date: 03/17/2018 CLINICAL DATA:  Code stroke.  Left facial droop and weakness. EXAM: CT HEAD WITHOUT CONTRAST TECHNIQUE: Contiguous axial images were obtained from the base of the skull through the vertex without intravenous contrast. COMPARISON:  Head CT 11/15/2017 FINDINGS: Brain: There is no mass, hemorrhage or extra-axial collection. The size and configuration of the ventricles and extra-axial CSF spaces are normal. There is hypoattenuation of the periventricular white matter, most commonly indicating chronic ischemic microangiopathy. Unchanged old left basal ganglia lacunar infarcts. Vascular: No abnormal hyperdensity of the major intracranial arteries or dural venous sinuses. No intracranial atherosclerosis. Skull: The visualized skull base, calvarium and extracranial soft tissues are normal. Sinuses/Orbits: No fluid levels or advanced mucosal thickening of the visualized paranasal sinuses. No mastoid or middle ear effusion. The orbits are normal. ASPECTS [facility] Stroke Program Early CT Score) - Ganglionic level infarction (caudate, lentiform nuclei, internal capsule, insula, M1-M3 cortex): 7 - Supraganglionic infarction (M4-M6 cortex):  3 Total score (0-10 with 10 being normal): 10 IMPRESSION: 1. No hemorrhage or other acute finding. 2. ASPECTS is 10. These results were communicated to Dr. Kerney Elbe at 9:10 pm on 03/17/2018 by text page via the [facility] messaging system. Electronically Signed   By: Ulyses Jarred M.D.   On: 03/17/2018 21:11   EKG Interpretation  Date/Time:  Thursday March 17 2018 21:20:30 EST Ventricular Rate:  95 PR Interval:    QRS Duration: 98 QT Interval:  359 QTC Calculation: 452 R Axis:   52 Text Interpretation:  Sinus rhythm Ventricular premature complex Borderline repolarization abnormality No significant change since last tracing Confirmed by Deno Etienne [phone]) on 03/17/2018 9:24:38 PM   Pending Labs Unresulted Labs (From admission, onward)   None      Vitals/Pain Today's Vitals   03/17/18 2000 03/17/18 2100 03/17/18 2130 03/17/18 2200  BP:  (!) 222/73 (!) 186/72 (!) 173/65  Pulse:  (!) 103 93 94  Resp:  18 17 18   SpO2:  98% 98% 99%  Weight: 69.7 kg     Height: 5' (1.524 m)       Isolation Precautions No active isolations  Medications Medications  sodium chloride flush (NS) 0.9 % injection 3 mL (has no administration in time range)    Mobility walks

## 2018-03-18 ENCOUNTER — Other Ambulatory Visit: Payer: Self-pay

## 2018-03-18 ENCOUNTER — Observation Stay (HOSPITAL_COMMUNITY): Payer: Medicare HMO

## 2018-03-18 ENCOUNTER — Encounter (HOSPITAL_COMMUNITY): Payer: Self-pay | Admitting: *Deleted

## 2018-03-18 ENCOUNTER — Inpatient Hospital Stay (HOSPITAL_COMMUNITY): Payer: Medicare HMO

## 2018-03-18 DIAGNOSIS — R29810 Facial weakness: Secondary | ICD-10-CM | POA: Diagnosis present

## 2018-03-18 DIAGNOSIS — N179 Acute kidney failure, unspecified: Secondary | ICD-10-CM | POA: Diagnosis not present

## 2018-03-18 DIAGNOSIS — I7389 Other specified peripheral vascular diseases: Secondary | ICD-10-CM | POA: Diagnosis present

## 2018-03-18 DIAGNOSIS — E785 Hyperlipidemia, unspecified: Secondary | ICD-10-CM | POA: Diagnosis present

## 2018-03-18 DIAGNOSIS — E1122 Type 2 diabetes mellitus with diabetic chronic kidney disease: Secondary | ICD-10-CM | POA: Diagnosis present

## 2018-03-18 DIAGNOSIS — I5181 Takotsubo syndrome: Secondary | ICD-10-CM | POA: Diagnosis present

## 2018-03-18 DIAGNOSIS — I674 Hypertensive encephalopathy: Secondary | ICD-10-CM

## 2018-03-18 DIAGNOSIS — F419 Anxiety disorder, unspecified: Secondary | ICD-10-CM | POA: Diagnosis present

## 2018-03-18 DIAGNOSIS — N183 Chronic kidney disease, stage 3 (moderate): Secondary | ICD-10-CM | POA: Diagnosis not present

## 2018-03-18 DIAGNOSIS — R4781 Slurred speech: Secondary | ICD-10-CM | POA: Diagnosis present

## 2018-03-18 DIAGNOSIS — I639 Cerebral infarction, unspecified: Secondary | ICD-10-CM | POA: Diagnosis not present

## 2018-03-18 DIAGNOSIS — I10 Essential (primary) hypertension: Secondary | ICD-10-CM | POA: Diagnosis not present

## 2018-03-18 DIAGNOSIS — N184 Chronic kidney disease, stage 4 (severe): Secondary | ICD-10-CM | POA: Diagnosis present

## 2018-03-18 DIAGNOSIS — R471 Dysarthria and anarthria: Secondary | ICD-10-CM | POA: Diagnosis present

## 2018-03-18 DIAGNOSIS — Z6830 Body mass index (BMI) 30.0-30.9, adult: Secondary | ICD-10-CM | POA: Diagnosis not present

## 2018-03-18 DIAGNOSIS — I4891 Unspecified atrial fibrillation: Secondary | ICD-10-CM | POA: Diagnosis present

## 2018-03-18 DIAGNOSIS — I2510 Atherosclerotic heart disease of native coronary artery without angina pectoris: Secondary | ICD-10-CM

## 2018-03-18 DIAGNOSIS — F329 Major depressive disorder, single episode, unspecified: Secondary | ICD-10-CM | POA: Diagnosis present

## 2018-03-18 DIAGNOSIS — K219 Gastro-esophageal reflux disease without esophagitis: Secondary | ICD-10-CM | POA: Diagnosis present

## 2018-03-18 DIAGNOSIS — E11319 Type 2 diabetes mellitus with unspecified diabetic retinopathy without macular edema: Secondary | ICD-10-CM

## 2018-03-18 DIAGNOSIS — E669 Obesity, unspecified: Secondary | ICD-10-CM | POA: Diagnosis present

## 2018-03-18 DIAGNOSIS — D631 Anemia in chronic kidney disease: Secondary | ICD-10-CM | POA: Diagnosis present

## 2018-03-18 DIAGNOSIS — Z794 Long term (current) use of insulin: Secondary | ICD-10-CM

## 2018-03-18 DIAGNOSIS — F418 Other specified anxiety disorders: Secondary | ICD-10-CM | POA: Diagnosis present

## 2018-03-18 DIAGNOSIS — E876 Hypokalemia: Secondary | ICD-10-CM | POA: Diagnosis present

## 2018-03-18 DIAGNOSIS — I4892 Unspecified atrial flutter: Secondary | ICD-10-CM | POA: Diagnosis present

## 2018-03-18 DIAGNOSIS — I6350 Cerebral infarction due to unspecified occlusion or stenosis of unspecified cerebral artery: Secondary | ICD-10-CM | POA: Diagnosis not present

## 2018-03-18 DIAGNOSIS — I129 Hypertensive chronic kidney disease with stage 1 through stage 4 chronic kidney disease, or unspecified chronic kidney disease: Secondary | ICD-10-CM | POA: Diagnosis present

## 2018-03-18 DIAGNOSIS — M8580 Other specified disorders of bone density and structure, unspecified site: Secondary | ICD-10-CM | POA: Diagnosis present

## 2018-03-18 HISTORY — DX: Essential (primary) hypertension: I10

## 2018-03-18 LAB — GLUCOSE, CAPILLARY
Glucose-Capillary: 157 mg/dL — ABNORMAL HIGH (ref 70–99)
Glucose-Capillary: 173 mg/dL — ABNORMAL HIGH (ref 70–99)
Glucose-Capillary: 218 mg/dL — ABNORMAL HIGH (ref 70–99)
Glucose-Capillary: 252 mg/dL — ABNORMAL HIGH (ref 70–99)
Glucose-Capillary: 274 mg/dL — ABNORMAL HIGH (ref 70–99)
Glucose-Capillary: 331 mg/dL — ABNORMAL HIGH (ref 70–99)

## 2018-03-18 LAB — CBC
HCT: 33.8 % — ABNORMAL LOW (ref 36.0–46.0)
Hemoglobin: 11.7 g/dL — ABNORMAL LOW (ref 12.0–15.0)
MCH: 33.5 pg (ref 26.0–34.0)
MCHC: 34.6 g/dL (ref 30.0–36.0)
MCV: 96.8 fL (ref 80.0–100.0)
PLATELETS: 236 10*3/uL (ref 150–400)
RBC: 3.49 MIL/uL — ABNORMAL LOW (ref 3.87–5.11)
RDW: 11.9 % (ref 11.5–15.5)
WBC: 7.4 10*3/uL (ref 4.0–10.5)
nRBC: 0 % (ref 0.0–0.2)

## 2018-03-18 LAB — BASIC METABOLIC PANEL
Anion gap: 10 (ref 5–15)
BUN: 29 mg/dL — ABNORMAL HIGH (ref 8–23)
CALCIUM: 8.7 mg/dL — AB (ref 8.9–10.3)
CO2: 21 mmol/L — AB (ref 22–32)
Chloride: 104 mmol/L (ref 98–111)
Creatinine, Ser: 1.58 mg/dL — ABNORMAL HIGH (ref 0.44–1.00)
GFR calc non Af Amer: 30 mL/min — ABNORMAL LOW (ref >60)
GFR, EST AFRICAN AMERICAN: 35 mL/min — AB (ref >60)
Glucose, Bld: 263 mg/dL — ABNORMAL HIGH (ref 70–99)
Potassium: 3.4 mmol/L — ABNORMAL LOW (ref 3.5–5.1)
Sodium: 135 mmol/L (ref 135–145)

## 2018-03-18 LAB — HEMOGLOBIN A1C
HEMOGLOBIN A1C: 9.3 % — AB (ref 4.8–5.6)
Mean Plasma Glucose: 220.21 mg/dL

## 2018-03-18 MED ORDER — ASPIRIN EC 81 MG PO TBEC
81.0000 mg | DELAYED_RELEASE_TABLET | Freq: Every day | ORAL | Status: DC
  Administered 2018-03-18: 81 mg via ORAL
  Filled 2018-03-18: qty 1

## 2018-03-18 MED ORDER — INSULIN ASPART 100 UNIT/ML ~~LOC~~ SOLN
0.0000 [IU] | Freq: Three times a day (TID) | SUBCUTANEOUS | Status: DC
  Administered 2018-03-18: 5 [IU] via SUBCUTANEOUS
  Administered 2018-03-18: 8 [IU] via SUBCUTANEOUS
  Administered 2018-03-18 – 2018-03-19 (×2): 3 [IU] via SUBCUTANEOUS
  Administered 2018-03-19: 11 [IU] via SUBCUTANEOUS
  Administered 2018-03-20: 5 [IU] via SUBCUTANEOUS
  Administered 2018-03-20: 15 [IU] via SUBCUTANEOUS

## 2018-03-18 MED ORDER — ACETAMINOPHEN 650 MG RE SUPP: 650.0000 mg | Freq: Four times a day (QID) | RECTAL | Status: DC | PRN

## 2018-03-18 MED ORDER — ENSURE ENLIVE PO LIQD
237.0000 mL | Freq: Two times a day (BID) | ORAL | Status: DC
  Administered 2018-03-18: 237 mL via ORAL

## 2018-03-18 MED ORDER — INSULIN ASPART 100 UNIT/ML ~~LOC~~ SOLN
6.0000 [IU] | Freq: Three times a day (TID) | SUBCUTANEOUS | Status: DC
  Administered 2018-03-18 – 2018-03-20 (×4): 6 [IU] via SUBCUTANEOUS

## 2018-03-18 MED ORDER — SODIUM CHLORIDE 0.9% FLUSH: 3.0000 mL | INTRAVENOUS | Status: DC | PRN

## 2018-03-18 MED ORDER — HYDROCODONE-ACETAMINOPHEN 5-325 MG PO TABS: 1.0000 | ORAL_TABLET | ORAL | Status: DC | PRN

## 2018-03-18 MED ORDER — POTASSIUM CHLORIDE CRYS ER 20 MEQ PO TBCR
40.0000 meq | EXTENDED_RELEASE_TABLET | Freq: Two times a day (BID) | ORAL | Status: AC
  Administered 2018-03-18 – 2018-03-19 (×2): 40 meq via ORAL
  Filled 2018-03-18 (×2): qty 2

## 2018-03-18 MED ORDER — ATORVASTATIN CALCIUM 10 MG PO TABS: 20.0000 mg | ORAL_TABLET | Freq: Every day | ORAL | Status: DC

## 2018-03-18 MED ORDER — ONDANSETRON HCL 4 MG PO TABS: 4.0000 mg | ORAL_TABLET | Freq: Four times a day (QID) | ORAL | Status: DC | PRN

## 2018-03-18 MED ORDER — ACETAMINOPHEN 325 MG PO TABS
650.0000 mg | ORAL_TABLET | Freq: Four times a day (QID) | ORAL | Status: DC | PRN
  Administered 2018-03-18: 650 mg via ORAL
  Filled 2018-03-18: qty 2

## 2018-03-18 MED ORDER — FENOFIBRATE 160 MG PO TABS
160.0000 mg | ORAL_TABLET | Freq: Every day | ORAL | Status: DC
  Administered 2018-03-18 – 2018-03-20 (×3): 160 mg via ORAL
  Filled 2018-03-18 (×3): qty 1

## 2018-03-18 MED ORDER — SENNOSIDES-DOCUSATE SODIUM 8.6-50 MG PO TABS: 1.0000 | ORAL_TABLET | Freq: Every evening | ORAL | Status: DC | PRN

## 2018-03-18 MED ORDER — INSULIN DETEMIR 100 UNIT/ML ~~LOC~~ SOLN
30.0000 [IU] | Freq: Two times a day (BID) | SUBCUTANEOUS | Status: DC
  Administered 2018-03-18 – 2018-03-20 (×6): 30 [IU] via SUBCUTANEOUS
  Filled 2018-03-18 (×7): qty 0.3

## 2018-03-18 MED ORDER — KETOTIFEN FUMARATE 0.025 % OP SOLN
1.0000 [drp] | Freq: Two times a day (BID) | OPHTHALMIC | Status: DC
  Administered 2018-03-18 – 2018-03-20 (×6): 1 [drp] via OPHTHALMIC
  Filled 2018-03-18: qty 5

## 2018-03-18 MED ORDER — ALBUTEROL SULFATE (2.5 MG/3ML) 0.083% IN NEBU: 2.5000 mg | INHALATION_SOLUTION | Freq: Four times a day (QID) | RESPIRATORY_TRACT | Status: DC | PRN

## 2018-03-18 MED ORDER — INSULIN ASPART 100 UNIT/ML ~~LOC~~ SOLN
0.0000 [IU] | Freq: Every day | SUBCUTANEOUS | Status: DC
  Administered 2018-03-18: 3 [IU] via SUBCUTANEOUS
  Administered 2018-03-19: 2 [IU] via SUBCUTANEOUS

## 2018-03-18 MED ORDER — ONDANSETRON HCL 4 MG/2ML IJ SOLN: 4.0000 mg | Freq: Four times a day (QID) | INTRAMUSCULAR | Status: DC | PRN

## 2018-03-18 MED ORDER — STROKE: EARLY STAGES OF RECOVERY BOOK
Freq: Once | Status: AC
  Administered 2018-03-20: 1
  Filled 2018-03-18: qty 1

## 2018-03-18 MED ORDER — SODIUM CHLORIDE 0.9% FLUSH
3.0000 mL | Freq: Two times a day (BID) | INTRAVENOUS | Status: DC
  Administered 2018-03-18 – 2018-03-19 (×4): 3 mL via INTRAVENOUS

## 2018-03-18 MED ORDER — HEPARIN SODIUM (PORCINE) 5000 UNIT/ML IJ SOLN
5000.0000 [IU] | Freq: Three times a day (TID) | INTRAMUSCULAR | Status: DC
  Administered 2018-03-18 – 2018-03-20 (×7): 5000 [IU] via SUBCUTANEOUS
  Filled 2018-03-18 (×7): qty 1

## 2018-03-18 MED ORDER — FAMOTIDINE 20 MG PO TABS
40.0000 mg | ORAL_TABLET | Freq: Every day | ORAL | Status: DC
  Administered 2018-03-18: 40 mg via ORAL
  Filled 2018-03-18: qty 2

## 2018-03-18 MED ORDER — LABETALOL HCL 5 MG/ML IV SOLN
10.0000 mg | INTRAVENOUS | Status: DC | PRN
  Administered 2018-03-18 (×2): 10 mg via INTRAVENOUS
  Filled 2018-03-18 (×2): qty 4

## 2018-03-18 MED ORDER — CLONAZEPAM 0.5 MG PO TABS
0.5000 mg | ORAL_TABLET | Freq: Two times a day (BID) | ORAL | Status: DC | PRN
  Administered 2018-03-18 – 2018-03-19 (×2): 0.5 mg via ORAL
  Filled 2018-03-18 (×2): qty 1

## 2018-03-18 MED ORDER — SODIUM CHLORIDE 0.9 % IV SOLN: 250.0000 mL | INTRAVENOUS | Status: DC | PRN

## 2018-03-18 MED ORDER — SODIUM CHLORIDE 0.9% FLUSH
3.0000 mL | Freq: Two times a day (BID) | INTRAVENOUS | Status: DC
  Administered 2018-03-18 – 2018-03-19 (×3): 3 mL via INTRAVENOUS

## 2018-03-18 MED ORDER — ATORVASTATIN CALCIUM 80 MG PO TABS
80.0000 mg | ORAL_TABLET | Freq: Every day | ORAL | Status: DC
  Administered 2018-03-19: 80 mg via ORAL
  Filled 2018-03-18: qty 1

## 2018-03-18 MED ORDER — ASPIRIN EC 325 MG PO TBEC
325.0000 mg | DELAYED_RELEASE_TABLET | Freq: Every day | ORAL | Status: DC
  Administered 2018-03-19 – 2018-03-20 (×2): 325 mg via ORAL
  Filled 2018-03-18 (×2): qty 1

## 2018-03-18 MED ORDER — METOPROLOL SUCCINATE ER 25 MG PO TB24
50.0000 mg | ORAL_TABLET | Freq: Every day | ORAL | Status: DC
  Administered 2018-03-18 – 2018-03-20 (×3): 50 mg via ORAL
  Filled 2018-03-18 (×4): qty 2

## 2018-03-18 MED ORDER — MOMETASONE FURO-FORMOTEROL FUM 200-5 MCG/ACT IN AERO
2.0000 | INHALATION_SPRAY | Freq: Two times a day (BID) | RESPIRATORY_TRACT | Status: DC
  Administered 2018-03-18 – 2018-03-20 (×5): 2 via RESPIRATORY_TRACT
  Filled 2018-03-18: qty 8.8

## 2018-03-18 MED ORDER — FAMOTIDINE 20 MG PO TABS
20.0000 mg | ORAL_TABLET | Freq: Every day | ORAL | Status: DC
  Administered 2018-03-19 – 2018-03-20 (×2): 20 mg via ORAL
  Filled 2018-03-18 (×2): qty 1

## 2018-03-18 NOTE — Progress Notes (Addendum)
Brief progress note  Patient's MRI shows a punctate right occipital area of restricted diffusion, which although does not correlate with the patient symptoms, but discussed with neuroradiology and that it of restricted diffusion is visible and 2 different acquisitions-axial and coronal, and is a definitive real finding.  Given the history of uncontrolled blood pressures uncontrolled sugars, this could be a small lacunar stroke.  However incidental this might be, she needs optimization of her stroke risk factors.    Impression Acute ischemic stroke Hypertensive encephalopathy Toxic metabolic encephalopathy Hyperglycemia  Recommendations -Telemetry monitoring -Allow for permissive hypertension for the first 24-48h - only treat PRN if SBP >220 mmHg. Blood pressures can be gradually normalized to SBP<140 upon discharge. -MRA head without contrast -Carotid dopplers -Echocardiogram -HgbA1c, fasting lipid panel -Frequent neuro checks -Prophylactic therapy-Antiplatelet med: Aspirin - dose 325mg  PO or 300mg  PR. Consider DAPT based on vessel imaging -Atorvastatin 80 mg PO daily -Risk factor modification -PT consult, OT consult, Speech consult -Management of toxic metabolic encephalopathy and hyperglycemia primary team as you are.  Please page stroke NP/PA/MD (listed on AMION)  from 8am-4 pm as this patient will be followed by the stroke team at this point.  -- Amie [place], MD [facility] Pager: [phone] If 7pm to 7am, please call on call as listed on AMION.

## 2018-03-18 NOTE — Progress Notes (Signed)
Carotid duplex exam completed. Please see preliminary notes on CV PROC under chart review. Tekia [place] H Japji Kok(RDMS RVT) 03/18/18 6:27 PM

## 2018-03-18 NOTE — H&P (Signed)
History and Physical    [name] HLK:[id number] DOB: 1933/11/21 DOA: 03/17/2018  PCP: Nickola Major, MD   Patient coming from: Home   Chief Complaint: Slurred speech, perioral numbness, possible left-sided weakness and right facial droop, headache  HPI: Sandra [name] is a 85 y.o. female with medical history significant for insulin-dependent diabetes mellitus, depression with anxiety, hypertension, and chronic kidney disease stage III, now presenting to the emergency department with slurred speech, frontal headache, perioral numbness, and possible left-sided weakness and right facial droop.  Patient has been under a lot of stress related to caring for an ill relative and has not taken her medications in 3 days secondary to this.  Symptoms developed acutely shortly prior to arrival in the ED.  EMS was called, found her blood pressure to be 214/101, and brought her into the ED for further evaluation.  She denies any chest pain, palpitations, lower extremity swelling or tenderness, or fevers or chills.  ED Course: Upon arrival to the ED, patient is found to be afebrile, saturating well on room air, and with blood pressure 734 systolic.  EKG features a sinus rhythm.  Noncontrast head CT is negative for acute hemorrhage or other acute abnormality.  Chemistry panel is notable for a glucose of 365 and creatinine 1.83, up from 1.50 in October.  CBC is unremarkable.  Blood pressure was treated in the emergency department and her symptoms resolved with this.  Neurology was consulted by the ED physician, suspects hypertensive encephalopathy, and recommends medical admission for MRI brain and ongoing blood pressure control.  Review of Systems:  All other systems reviewed and apart from HPI, are negative.  Past Medical History:  Diagnosis Date  . Acid reflux   . Acute MI ([place])   . Allergic rhinitis   . Anxiety   . Degenerative joint disease   . Depression   . Diabetes mellitus    type 2  .  Gait disturbance   . GERD (gastroesophageal reflux disease)   . Hyperlipidemia   . Hyperlipidemia   . Hypertension   . Neck pain, chronic   . Osteopenia   . SAH (subarachnoid hemorrhage) ([place])   . Sciatica   . Takotsubo cardiomyopathy    with subsequent normalization of left ventricular systolic function  . TIA (transient ischemic attack)   . Valvular heart disease     Past Surgical History:  Procedure Laterality Date  . ABDOMINAL HYSTERECTOMY  1977   no oophorectomy  . APPENDECTOMY    . CARDIAC CATHETERIZATION    . CATARACT EXTRACTION, BILATERAL  2011  . SPINAL FUSION  2000   Dr. Vertell Limber  . TONSILLECTOMY       reports that she is a non-smoker but has been exposed to tobacco smoke. She has never used smokeless tobacco. She reports that she does not drink alcohol or use drugs.  Allergies  Allergen Reactions  . Cephalexin Nausea And Vomiting    Pt stated made severely sick, will never take again  . Pioglitazone Other (See Comments)    REACTION: EDEMA    Family History  Problem Relation Age of Onset  . Intracerebral hemorrhage Daughter        assoc with post-partum  . Diabetes Sister   . Asthma Sister   . Diabetes Sister   . Breast cancer Other        ?Aunt  . Cancer Other        breast?  . Coronary artery disease Neg Hx  Prior to Admission medications   Medication Sig Start Date End Date Taking? Authorizing Provider  Albuterol Sulfate (PROAIR RESPICLICK) 409 (90 Base) MCG/ACT AEPB Inhale 1-2 puffs into the lungs every 6 (six) hours as needed. Patient taking differently: Inhale 1-2 puffs into the lungs every 6 (six) hours as needed (SOB).  09/14/16  Yes Noralee Space, MD  aspirin 81 MG tablet Take 81 mg by mouth daily.   Yes [provider]  atorvastatin (LIPITOR) 20 MG tablet TAKE 1 TABLET EVERY DAY Patient taking differently: Take 20 mg by mouth daily.  10/20/16  Yes Midge Minium, MD  clonazePAM (KLONOPIN) 0.5 MG tablet TAKE 1 TABLET TWICE  DAILY AS NEEDED FOR ANXIETY Patient taking differently: Take 0.5 mg by mouth 2 (two) times daily as needed for anxiety.  10/08/16  Yes Midge Minium, MD  Cyanocobalamin (VITAMIN B-12 PO) Take 1 tablet by mouth daily.    Yes [provider]  famotidine (PEPCID) 40 MG tablet Take 40 mg by mouth daily.  11/15/17 11/15/18 Yes [provider]  fenofibrate 160 MG tablet TAKE 1 TABLET EVERY DAY Patient taking differently: Take 160 mg by mouth daily.  01/26/17  Yes Midge Minium, MD  Fluticasone-Salmeterol (ADVAIR DISKUS) 250-50 MCG/DOSE AEPB Inhale 1 puff into the lungs 2 (two) times daily. 03/17/16  Yes Noralee Space, MD  glimepiride (AMARYL) 4 MG tablet Take 2 mg by mouth 2 (two) times daily.  02/19/14  Yes [provider]  insulin regular (NOVOLIN R,HUMULIN R) 250 units/2.54mL (100 units/mL) injection 27 Units 3 (three) times daily before meals. 24 units TID before meals 10/11/15  Yes [provider]  lisinopril (PRINIVIL,ZESTRIL) 10 MG tablet Take 10 mg by mouth daily.    Yes [provider]  metoprolol succinate (TOPROL-XL) 50 MG 24 hr tablet TAKE 1 TABLET EVERY DAY.  TAKE  WITH  OR  IMMEDIATELY FOLLOWING A MEAL  Patient taking differently: Take 50 mg by mouth daily.  11/04/16  Yes Midge Minium, MD  NOVOLIN N RELION 100 UNIT/ML injection Inject 18-40 Units into the skin See admin instructions. 40 units in the morning and 18 units in the evening.   Yes [provider]  ACCU-CHEK FASTCLIX LANCETS [place]  02/04/16   [provider]  ACCU-CHEK SMARTVIEW test strip  02/04/16   [provider]  HYDROcodone-acetaminophen (NORCO/VICODIN) 5-325 MG tablet Take 1 tablet by mouth every 6 (six) hours as needed for moderate pain. Patient not taking: Reported on 03/17/2018 10/13/16   Midge Minium, MD  Insulin Syringe-Needle U-100 (INSULIN SYRINGE .5CC/31GX5/16") 31G X 5/16" 0.5 ML MISC 40 units in morning and 18units at night  02/04/16   [provider]  tiZANidine (ZANAFLEX) 2 MG tablet Take 1 tablet (2 mg total) by mouth every 8 (eight) hours as needed for muscle spasms. Patient not taking: Reported on 03/17/2018 11/18/16   Midge Minium, MD    Physical Exam: Vitals:   03/17/18 2200 03/17/18 2258 03/17/18 2330 03/18/18 0008  BP: (!) 173/65 (!) 166/60 (!) 173/87 (!) 164/75  Pulse: 94 (!) 111 97 89  Resp: 18 (!) 22 (!) 23 17  Temp:    98.6 F (37 C)  TempSrc:    Oral  SpO2: 99% 95% 96% 98%  Weight:    70 kg  Height:    5' (1.524 m)    Constitutional: NAD, calm  Eyes: PERTLA, mild right conjunctival injection  ENMT: Mucous membranes are moist. Posterior pharynx  clear of any exudate or lesions.   Neck: normal, supple, no masses, no thyromegaly Respiratory: clear to auscultation bilaterally, no wheezing, no crackles. Normal respiratory effort.   Cardiovascular: S1 & S2 heard, regular rate and rhythm. No extremity edema.   Abdomen: No distension, no tenderness, soft. Bowel sounds normal.  Musculoskeletal: no clubbing / cyanosis. No joint deformity upper and lower extremities.   Skin: no significant rashes, lesions, ulcers. Warm, dry, well-perfused. Neurologic: CN 2-12 grossly intact. Sensation intact, DTR normal. Strength 5/5 in all 4 limbs.  Psychiatric: Alert and oriented to person, place, and situation. Calm, cooperative.    Labs on Admission: I have personally reviewed following labs and imaging studies  CBC: Recent Labs  Lab 03/17/18 2107  WBC 6.6  NEUTROABS 3.1  HGB 13.4  HCT 40.8  MCV 99.0  PLT 267   Basic Metabolic Panel: Recent Labs  Lab 03/17/18 2107 03/17/18 2117  NA 137  --   K 4.3  --   CL 103  --   CO2 22  --   GLUCOSE 365*  --   BUN 34*  --   CREATININE 1.83* 1.90*  CALCIUM 9.3  --    GFR: Estimated Creatinine Clearance: 19.9 mL/min (A) (by C-G formula based on SCr of 1.9 mg/dL (H)). Liver Function Tests: Recent Labs  Lab 03/17/18 2107  AST 22  ALT  18  ALKPHOS 62  BILITOT 0.8  PROT 7.2  ALBUMIN 4.0   No results for input(s): LIPASE, AMYLASE in the last 168 hours. No results for input(s): AMMONIA in the last 168 hours. Coagulation Profile: Recent Labs  Lab 03/17/18 2107  INR 1.1   Cardiac Enzymes: No results for input(s): CKTOTAL, CKMB, CKMBINDEX, TROPONINI in the last 168 hours. BNP (last 3 results) No results for input(s): PROBNP in the last 8760 hours. HbA1C: No results for input(s): HGBA1C in the last 72 hours. CBG: Recent Labs  Lab 03/18/18 0013  GLUCAP 274*   Lipid Profile: No results for input(s): CHOL, HDL, LDLCALC, TRIG, CHOLHDL, LDLDIRECT in the last 72 hours. Thyroid Function Tests: No results for input(s): TSH, T4TOTAL, FREET4, T3FREE, THYROIDAB in the last 72 hours. Anemia Panel: No results for input(s): VITAMINB12, FOLATE, FERRITIN, TIBC, IRON, RETICCTPCT in the last 72 hours. Urine analysis:    Component Value Date/Time   COLORURINE YELLOW 12/14/2016 1058   APPEARANCEUR CLEAR 12/14/2016 1058   LABSPEC 1.015 12/14/2016 1058   PHURINE 6.0 12/14/2016 1058   GLUCOSEU >=1000 (A) 12/14/2016 1058   HGBUR NEGATIVE 12/14/2016 1058   HGBUR moderate 10/31/2009 0958   BILIRUBINUR NEGATIVE 12/14/2016 1058   BILIRUBINUR negative 10/13/2016 1059   KETONESUR NEGATIVE 12/14/2016 1058   PROTEINUR negative 10/13/2016 1059   PROTEINUR NEGATIVE 05/23/2015 1504   UROBILINOGEN 0.2 12/14/2016 1058   NITRITE NEGATIVE 12/14/2016 1058   LEUKOCYTESUR TRACE (A) 12/14/2016 1058   Sepsis Labs: @LABRCNTIP (procalcitonin:4,lacticidven:4) )No results found for this or any previous visit (from the past 240 hour(s)).   Radiological Exams on Admission: Ct Head Code Stroke Wo Contrast  Result Date: 03/17/2018 CLINICAL DATA:  Code stroke.  Left facial droop and weakness. EXAM: CT HEAD WITHOUT CONTRAST TECHNIQUE: Contiguous axial images were obtained from the base of the skull through the vertex without intravenous contrast.  COMPARISON:  Head CT 11/15/2017 FINDINGS: Brain: There is no mass, hemorrhage or extra-axial collection. The size and configuration of the ventricles and extra-axial CSF spaces are normal. There is hypoattenuation of the periventricular white matter, most commonly indicating chronic ischemic  microangiopathy. Unchanged old left basal ganglia lacunar infarcts. Vascular: No abnormal hyperdensity of the major intracranial arteries or dural venous sinuses. No intracranial atherosclerosis. Skull: The visualized skull base, calvarium and extracranial soft tissues are normal. Sinuses/Orbits: No fluid levels or advanced mucosal thickening of the visualized paranasal sinuses. No mastoid or middle ear effusion. The orbits are normal. ASPECTS [facility] Stroke Program Early CT Score) - Ganglionic level infarction (caudate, lentiform nuclei, internal capsule, insula, M1-M3 cortex): 7 - Supraganglionic infarction (M4-M6 cortex): 3 Total score (0-10 with 10 being normal): 10 IMPRESSION: 1. No hemorrhage or other acute finding. 2. ASPECTS is 10. These results were communicated to Dr. Kerney Elbe at 9:10 pm on 03/17/2018 by text page via the [facility] messaging system. Electronically Signed   By: Ulyses Jarred M.D.   On: 03/17/2018 21:11    EKG: Independently reviewed. Sinus rhythm, PVC.   Assessment/Plan   1. Acute encephalopathy; suspected hypertensive encephalopathy  - Presents with dysarthria, left-sided weakness and numbness, and right facial droop  - SBP was 222 on arrival and symptoms resolved with BP control  - No acute findings on head CT  - Neurology consulting and much appreciated  - Likely hypertensive crisis, less likely TIA/CVA  - Continue BP-control with goal SBP <160, check MRI brain, continue neuro checks   2. CKD stage III-IV  - SCr is 1.83, up from 1.50 in October  - Renally-dose medications, hold lisinopril initially, repeat chem panel in am   3. Insulin-dependent DM  - A1c was 8.3% in May  -  Managed at home with glimepiride, Novolin R 24-27 units TID, and Novolin N 40 units qAM and 18 units qHS  - Check CBG's and use Levemir with mealtime and correctional Novolog while in hospital   4. CAD  - No anginal complaints  - Continue ASA, statin, and beta-blocker     DVT prophylaxis: sq heparin  Code Status: Full  Family Communication: Discussed with patient  Consults called: neurology  Admission status: Observation     Vianne Bulls, MD [facility] Pager [phone]  If 7PM-7AM, please contact night-coverage [email or web address] Password [email or web address]  03/18/2018, 1:23 AM

## 2018-03-18 NOTE — Progress Notes (Signed)
Nutrition Brief Note  Patient identified on the Malnutrition Screening Tool (MST) Report  Wt Readings from Last 15 Encounters:  03/18/18 70 kg  03/08/18 71.1 kg  11/15/17 71.7 kg  09/14/17 71.6 kg  03/17/17 69.9 kg  11/18/16 70.5 kg  10/13/16 70.1 kg  09/14/16 70 kg  03/17/16 69.4 kg  02/28/16 69.9 kg  02/10/16 69.2 kg  01/11/16 70.3 kg  01/10/16 70.3 kg  12/06/15 71.2 kg  10/25/15 68.5 kg    Body mass index is 30.14 kg/m. Patient meets criteria for class I obesity based on current BMI.   Current diet order is heart healthy/carbohydrate modified. Pt reports appetite has been fine and she has been eating well PTA. Labs and medications reviewed. Pt currently has Ensure ordered. Will continue to current orders due to patient preference.  No further nutrition interventions warranted at this time. If nutrition issues arise, please consult RD.   Corrin Parker, MS, RD, LDN Pager # [phone] After hours/ weekend pager # [phone]

## 2018-03-18 NOTE — Progress Notes (Signed)
PROGRESS NOTE    Sandra [name]  LOV:[id number] DOB: 08/28/1933 DOA: 03/17/2018 PCP: Nickola Major, MD   Brief Narrative:  HPI per Dr. Mitzi Hansen on 03/18/2018 Sandra [name] is a 85 y.o. female with medical history significant for insulin-dependent diabetes mellitus, depression with anxiety, hypertension, and chronic kidney disease stage III, now presenting to the emergency department with slurred speech, frontal headache, perioral numbness, and possible left-sided weakness and right facial droop.  Patient has been under a lot of stress related to caring for an ill relative and has not taken her medications in 3 days secondary to this.  Symptoms developed acutely shortly prior to arrival in the ED.  EMS was called, found her blood pressure to be 214/101, and brought her into the ED for further evaluation.  She denies any chest pain, palpitations, lower extremity swelling or tenderness, or fevers or chills.  **Confirmed that she has a stroke and she is undergoing stroke work-up.  Neurology is following. Symptoms have now resolved her blood pressure is now better  Assessment & Plan:   Principal Problem:   Acute encephalopathy Active Problems:   Chronic kidney disease (CKD), stage III (moderate) (HCC)   Diabetes mellitus type 2 with retinopathy (HCC)   CAD (coronary artery disease)   Severe hypertension   Encephalopathy, hypertensive  Acute encephalopathy; Suspected Hypertensive Encephalopathy  - Presented with dysarthria, left-sided weakness and numbness, and right facial droop  -SBP was 222 on arrival and symptoms resolved with BP control; Neurology recommending Allowing for permissive HTN for the first 24-48 hours and treat ONLY if SBP>220 mmHg and then recommend BP to be normalized to SBP<140 at D/C  -No acute findings on head CT  -MRI showed Punctate Right Occipital Area of Restricted Diffusion that does not correlate with the patient's symptoms and likely is incidental given  Uncontrolled BP's and Uncontrolled Sugars -Neurology consulting and much appreciated and recommending optimization of Stroke Risk Factors -Per Neurology allow for Permissive HTN  Acute Ischemic CVA -Presented with dysarthria, left-sided weakness and numbness, and right facial droop  -Head CT and Cervical Spine showed Mild chronic ischemic white matter disease. No acute intracranial abnormality seen. Multilevel degenerative disc disease is noted. Status post surgical anterior fusion of C6-7. No acute abnormality seen in the cervical spine. -MRI of Brain showed Possible tiny acute infarct in the right occipital cortex. Given the location this could be an artifact however is seen on axial and coronal diffusion imaging suggesting this may be an acute infarct. Moderate chronic microvascular ischemic change in the white matter. -Neurology Consulted and recommending Continued CVA Workup -Continue telemetry monitoring -Neurology recommending getting an MRI of the head without contrast -Neurology also recommended carotid Dopplers, echocardiogram -Hemoglobin A1c was 9.3; Fasting lipid panel will be checked -Continue with neurochecks per protocol -Patient will be started on aspirin 3 2 5  mg p.o. 300 mg PR and consideration will be made for dual antiplatelet therapy based on vessel imaging -Patient was placed on atorvastatin 80 mils p.o. daily and therapies have been consulted and PT is recommending outpatient PT with SLP and OT still pending -Continue Risk Factor Modification -Stroke Team to evaluate in AM   Mild AKI on CKD stage III-IV, improved -SCr is 1.83, up from 1.50 in October; Repeat this AM was  -Renally-dose medications, Avoid Contrast Dyes -Continue to hold Lisinopril for now  -Repeat CMP in AM    Insulin-Dependent DM  -HbA1c was 8.3% in May and repeat this visit was 9.3 -Managed at home  with Glimepiride, Novolin R 24-27 units TID, and Novolin N 40 units qAM and 18 units qHS  - Check  CBG's and use Levemir with mealtime and correctional Novolog while in hospital  -Currently is on 30 units Levemir BID, Moderate Novolog SSI AC/HS and 6 units sq TIDwm -Blood Sugars have been ranging from 157-252 -Continue adjust Insulin Regimen as Necessary  CAD/Hx of Takotsubo Cardiomyopathy -Currently has No anginal complaints  -Continue ASA but will increase to 325 (was on 81 mg PTA), Atorvastatin 20 mg increased to 80 mg at the recommendation of Neurology -C/w Beta-blocker Metoprolol XL 50 mg po Daily    Hypokalemia -Patient's K+ was 3.4 this AM  -Replete with po KCl 40 mEQ BID x2  -Continue to Monitor and Replete as Necessary -Repeat CMP in AM  Normocytic Anemia  -Patient's Hb/Hct went from 13.4/40.8 -> 11.7/33.8 -Continue to Monitor for S/Sx of Bleeding -Check Anemia Panel in the AM  -Repeat CBC in AM   Depression and Anxiety -C/w Clonazepam 0.5 mg po BIDprn Anxiety   GERD/Acid Reflux -C/w Famotidine 20 mg po Daily   HLD -Check Lipid Panel in the AM -C/w increased Atorvastatin at 80 mg Daily  -C/w Fenofibrate 160 mg po daily   Obesity -Estimated body mass index is 30.14 kg/m as calculated from the following:   Height as of this encounter: 5' (1.524 m).   Weight as of this encounter: 70 kg. -Weight Loss and Dietary Counseling given -C/w Ensure Enlive 237 mL po BID between meals  DVT prophylaxis: Heparin 5,000 units sq q8h Code Status: FULL CODE Family Communication: No family present at bedside  Disposition Plan: Pending further Neurological Workup and Clearance  Consultants:   Neurology    Procedures:  ECHOCARDIOGRAM ordered and pending  CAROTID DOPPLERS ordered and pending  Antimicrobials:  Anti-infectives (From admission, onward)   None     Subjective: Seen and Examined at bedside and states that symptoms are resolved.  No nausea or vomiting.  Denies any headaches, blurred vision or double vision.  States that she feels okay.  She tells me that  she knew that something was wrong and felt like she was having a stroke.  No other concerns or complaints at this time.  Objective: Vitals:   03/18/18 0759 03/18/18 0900 03/18/18 1151 03/18/18 1627  BP:  139/66 (!) 147/72 (!) 139/126  Pulse:  82 71 68  Resp:  20 17 18   Temp:  98.4 F (36.9 C) 98.6 F (37 C) 98.2 F (36.8 C)  TempSrc:  Oral Oral Axillary  SpO2: 100% 96% 100% 100%  Weight:      Height:        Intake/Output Summary (Last 24 hours) at 03/18/2018 1730 Last data filed at 03/18/2018 0400 Gross per 24 hour  Intake 242 ml  Output -  Net 242 ml   Filed Weights   03/17/18 2000 03/18/18 0008  Weight: 69.7 kg 70 kg   Examination: Physical Exam:  Constitutional: WN/WD obese Caucasian female in NAD and appears calm and comfortable Eyes:  Lids and conjunctivae normal, sclerae anicteric  ENMT: External Ears, Nose appear normal. Grossly normal hearing. Mucous membranes are moist. .  Neck: Appears normal, supple, no cervical masses, normal ROM, no appreciable thyromegaly; no JVD Respiratory: Diminished to auscultation bilaterally, no wheezing, rales, rhonchi or crackles. Normal respiratory effort and patient is not tachypenic. No accessory muscle use.  Cardiovascular: RRR, no murmurs / rubs / gallops. S1 and S2 auscultated. Trace extremity edema.  Abdomen:  Soft, non-tender, Distended slightly due to body habitus. No masses palpated. No appreciable hepatosplenomegaly. Bowel sounds positive x4.  GU: Deferred. Musculoskeletal: No clubbing / cyanosis of digits/nails. No joint deformity upper and lower extremities. Skin: No rashes, lesions, ulcers on a limited skin evaluation. No induration; Warm and dry.  Neurologic: CN 2-12 grossly intact with no focal deficits.  Romberg sign and cerebellar reflexes not assessed.  Psychiatric: Normal judgment and insight. Alert and oriented x 3. Normal mood and appropriate affect.   Data Reviewed: I have personally reviewed following labs and  imaging studies  CBC: Recent Labs  Lab 03/17/18 2107 03/18/18 0325  WBC 6.6 7.4  NEUTROABS 3.1  --   HGB 13.4 11.7*  HCT 40.8 33.8*  MCV 99.0 96.8  PLT 251 330   Basic Metabolic Panel: Recent Labs  Lab 03/17/18 2107 03/17/18 2117 03/18/18 0325  NA 137  --  135  K 4.3  --  3.4*  CL 103  --  104  CO2 22  --  21*  GLUCOSE 365*  --  263*  BUN 34*  --  29*  CREATININE 1.83* 1.90* 1.58*  CALCIUM 9.3  --  8.7*   GFR: Estimated Creatinine Clearance: 24 mL/min (A) (by C-G formula based on SCr of 1.58 mg/dL (H)). Liver Function Tests: Recent Labs  Lab 03/17/18 2107  AST 22  ALT 18  ALKPHOS 62  BILITOT 0.8  PROT 7.2  ALBUMIN 4.0   No results for input(s): LIPASE, AMYLASE in the last 168 hours. No results for input(s): AMMONIA in the last 168 hours. Coagulation Profile: Recent Labs  Lab 03/17/18 2107  INR 1.1   Cardiac Enzymes: No results for input(s): CKTOTAL, CKMB, CKMBINDEX, TROPONINI in the last 168 hours. BNP (last 3 results) No results for input(s): PROBNP in the last 8760 hours. HbA1C: Recent Labs    03/18/18 0325  HGBA1C 9.3*   CBG: Recent Labs  Lab 03/17/18 2054 03/18/18 0013 03/18/18 0825 03/18/18 1218  GLUCAP 331* 274* 218* 252*   Lipid Profile: No results for input(s): CHOL, HDL, LDLCALC, TRIG, CHOLHDL, LDLDIRECT in the last 72 hours. Thyroid Function Tests: No results for input(s): TSH, T4TOTAL, FREET4, T3FREE, THYROIDAB in the last 72 hours. Anemia Panel: No results for input(s): VITAMINB12, FOLATE, FERRITIN, TIBC, IRON, RETICCTPCT in the last 72 hours. Sepsis Labs: No results for input(s): PROCALCITON, LATICACIDVEN in the last 168 hours.  Recent Results (from the past 240 hour(s))  Culture, blood (routine x 2)     Status: None (Preliminary result)   Collection Time: 03/18/18  3:24 AM  Result Value Ref Range Status   Specimen Description BLOOD RIGHT HAND  Final   Special Requests   Final    BOTTLES DRAWN AEROBIC AND ANAEROBIC Blood  Culture adequate volume Performed at [facility], [address], [place], [place] [address]    Culture NO GROWTH < 12 HOURS  Final   Report Status PENDING  Incomplete  Culture, blood (routine x 2)     Status: None (Preliminary result)   Collection Time: 03/18/18  3:34 AM  Result Value Ref Range Status   Specimen Description BLOOD LEFT HAND  Final   Special Requests   Final    BOTTLES DRAWN AEROBIC ONLY Blood Culture results may not be optimal due to an inadequate volume of blood received in culture bottles Performed at [facility] [address], [place], [place] [address]    Culture NO GROWTH < 12 HOURS  Final   Report Status PENDING  Incomplete    Radiology Studies: Mr Brain Wo Contrast  Result Date: 03/18/2018 CLINICAL DATA:  Focal neuro deficit less than 6 hours. Diabetes hypertension chronic kidney disease. Slurred speech headache. Possible left-sided weakness and right facial droop. EXAM: MRI HEAD WITHOUT CONTRAST TECHNIQUE: Multiplanar, multiecho pulse sequences of the brain and surrounding structures were obtained without intravenous contrast. COMPARISON:  CT head 03/17/2018 FINDINGS: Brain: Possible tiny acute infarct in the right occipital lobe. This is cortically based and could be an artifact however is seen on coronal and axial diffusion. No other acute infarct. Mild to moderate chronic white matter changes. No cortical infarct. Negative for hemorrhage or mass. Ventricle size and cerebral volume normal for age. Vascular: Normal arterial flow voids Skull and upper cervical spine: Negative Sinuses/Orbits: Mild mucosal edema paranasal sinuses. Bilateral cataract surgery. Other: None IMPRESSION: 1. Possible tiny acute infarct in the right occipital cortex. Given the location this could be an artifact however is seen on axial and coronal diffusion imaging suggesting this may be an acute infarct. 2. Moderate chronic microvascular ischemic change in the white matter.  Electronically Signed   By: Franchot Gallo M.D.   On: 03/18/2018 06:53   Ct Head Code Stroke Wo Contrast  Result Date: 03/17/2018 CLINICAL DATA:  Code stroke.  Left facial droop and weakness. EXAM: CT HEAD WITHOUT CONTRAST TECHNIQUE: Contiguous axial images were obtained from the base of the skull through the vertex without intravenous contrast. COMPARISON:  Head CT 11/15/2017 FINDINGS: Brain: There is no mass, hemorrhage or extra-axial collection. The size and configuration of the ventricles and extra-axial CSF spaces are normal. There is hypoattenuation of the periventricular white matter, most commonly indicating chronic ischemic microangiopathy. Unchanged old left basal ganglia lacunar infarcts. Vascular: No abnormal hyperdensity of the major intracranial arteries or dural venous sinuses. No intracranial atherosclerosis. Skull: The visualized skull base, calvarium and extracranial soft tissues are normal. Sinuses/Orbits: No fluid levels or advanced mucosal thickening of the visualized paranasal sinuses. No mastoid or middle ear effusion. The orbits are normal. ASPECTS [facility] Stroke Program Early CT Score) - Ganglionic level infarction (caudate, lentiform nuclei, internal capsule, insula, M1-M3 cortex): 7 - Supraganglionic infarction (M4-M6 cortex): 3 Total score (0-10 with 10 being normal): 10 IMPRESSION: 1. No hemorrhage or other acute finding. 2. ASPECTS is 10. These results were communicated to Dr. Kerney Elbe at 9:10 pm on 03/17/2018 by text page via the [facility] messaging system. Electronically Signed   By: Ulyses Jarred M.D.   On: 03/17/2018 21:11   Scheduled Meds: .  stroke: mapping our early stages of recovery book   Does not apply Once  . aspirin EC  81 mg Oral Daily  . atorvastatin  20 mg Oral q1800  . [START ON 03/19/2018] famotidine  20 mg Oral Daily  . feeding supplement (ENSURE ENLIVE)  237 mL Oral BID BM  . fenofibrate  160 mg Oral Daily  . heparin  5,000 Units Subcutaneous Q8H  .  insulin aspart  0-15 Units Subcutaneous TID WC  . insulin aspart  0-5 Units Subcutaneous QHS  . insulin aspart  6 Units Subcutaneous TID WC  . insulin detemir  30 Units Subcutaneous BID  . ketotifen  1 drop Right Eye BID  . metoprolol succinate  50 mg Oral Daily  . mometasone-formoterol  2 puff Inhalation BID  . sodium chloride flush  3 mL Intravenous Q12H  . sodium chloride flush  3 mL Intravenous Q12H   Continuous  Infusions: . sodium chloride      LOS: 0 days   Kerney Elbe, DO [facility] PAGER is on [place]  If 7PM-7AM, please contact night-coverage [email or web address] Password [facility] 03/18/2018, 5:30 PM

## 2018-03-18 NOTE — Evaluation (Signed)
Physical Therapy Evaluation Patient Details Name: Sandra [name] MRN: [id number] DOB: April 01, 1933 Today's Date: 03/18/2018   History of Present Illness  Pt is an 85 y/o female admitted secondary to HA, perioral numbness and facial droop. MRI of the brain revealed Possible tiny acute infarct in the right occipital cortex. Given the location this could be an artifact however is seen on axial and coronal diffusion imaging suggesting this may be an acute infarct. PMH including but not limited to DM and HTN.    Clinical Impression  Pt presented supine in bed with HOB elevated, awake and willing to participate in therapy session. Prior to admission, pt reported that she was independent with all functional mobility and ADLs. Pt lives with her daughter and granddaughter in a single level house with two steps to enter. She is currently supervision to min guard level overall for mobility without use of an AD. She participated in a higher level balance assessment and scored a 21/24 on the DGI which does not indicate that she is at an increased risk for falls. However, pt reporting a few falls recently and would like to work on her balance. Therefore currently recommending OP PT to address her limitations upon d/c. Pt would continue to benefit from skilled physical therapy services at this time while admitted and after d/c to address the below listed limitations in order to improve overall safety and independence with functional mobility.     Follow Up Recommendations Outpatient PT    Equipment Recommendations  None recommended by PT    Recommendations for Other Services       Precautions / Restrictions Precautions Precautions: Fall Restrictions Weight Bearing Restrictions: No      Mobility  Bed Mobility Overal bed mobility: Needs Assistance Bed Mobility: Supine to Sit     Supine to sit: Supervision     General bed mobility comments: for safety  Transfers Overall transfer level: Needs  assistance Equipment used: None Transfers: Sit to/from Stand Sit to Stand: Min guard         General transfer comment: min guard for safety with transition into standing from EOB  Ambulation/Gait Ambulation/Gait assistance: Min guard Gait Distance (Feet): 400 Feet Assistive device: None Gait Pattern/deviations: Step-through pattern;Decreased stride length;Drifts right/left Gait velocity: able to fluctuate   General Gait Details: pt with mild instability but no overt LOB or need for physical assistance, use of stepping strategy for balance  Stairs Stairs: Yes Stairs assistance: Min guard Stair Management: Two rails;Step to pattern;Forwards Number of Stairs: 2 General stair comments: no LOB or instability  Wheelchair Mobility    Modified Rankin (Stroke Patients Only) Modified Rankin (Stroke Patients Only) Pre-Morbid Rankin Score: No symptoms Modified Rankin: Slight disability     Balance Overall balance assessment: Needs assistance Sitting-balance support: Feet supported Sitting balance-Leahy Scale: Good     Standing balance support: No upper extremity supported Standing balance-Leahy Scale: Good                   Standardized Balance Assessment Standardized Balance Assessment : Dynamic Gait Index   Dynamic Gait Index Level Surface: Normal Change in Gait Speed: Normal Gait with Horizontal Head Turns: Mild Impairment Gait with Vertical Head Turns: Normal Gait and Pivot Turn: Mild Impairment Step Over Obstacle: Normal Step Around Obstacles: Normal Steps: Mild Impairment Total Score: 21       Pertinent Vitals/Pain Pain Assessment: No/denies pain    Home Living Family/patient expects to be discharged to:: Private residence Living Arrangements: Children;Other relatives  Available Help at Discharge: Family;Available PRN/intermittently Type of Home: House Home Access: Stairs to enter Entrance Stairs-Rails: None Entrance Stairs-Number of Steps:  2 Home Layout: One level Home Equipment: Clinical cytogeneticist - 2 wheels      Prior Function Level of Independence: Independent         Comments: drives     Hand Dominance   Dominant Hand: Right    Extremity/Trunk Assessment   Upper Extremity Assessment Upper Extremity Assessment: Overall WFL for tasks assessed    Lower Extremity Assessment Lower Extremity Assessment: Overall WFL for tasks assessed    Cervical / Trunk Assessment Cervical / Trunk Assessment: Kyphotic  Communication   Communication: No difficulties  Cognition Arousal/Alertness: Awake/alert Behavior During Therapy: WFL for tasks assessed/performed Overall Cognitive Status: Within Functional Limits for tasks assessed                                 General Comments: cognition not formally assessed but [facility] for general conversation      General Comments      Exercises     Assessment/Plan    PT Assessment Patient needs continued PT services  PT Problem List Decreased balance;Decreased mobility;Decreased coordination       PT Treatment Interventions DME instruction;Gait training;Stair training;Functional mobility training;Therapeutic activities;Therapeutic exercise;Balance training;Neuromuscular re-education;Patient/family education    PT Goals (Current goals can be found in the Care Plan section)  Acute Rehab PT Goals Patient Stated Goal: stop falling PT Goal Formulation: With patient Time For Goal Achievement: 04/01/18 Potential to Achieve Goals: Good    Frequency Min 3X/week   Barriers to discharge        Co-evaluation               AM-PAC PT "6 Clicks" Mobility  Outcome Measure Help needed turning from your back to your side while in a flat bed without using bedrails?: None Help needed moving from lying on your back to sitting on the side of a flat bed without using bedrails?: None Help needed moving to and from a bed to a chair (including a wheelchair)?:  None Help needed standing up from a chair using your arms (e.g., wheelchair or bedside chair)?: None Help needed to walk in hospital room?: None Help needed climbing 3-5 steps with a railing? : None 6 Click Score: 24    End of Session Equipment Utilized During Treatment: Gait belt Activity Tolerance: Patient tolerated treatment well Patient left: in chair;with call bell/phone within reach Nurse Communication: Mobility status PT Visit Diagnosis: Other abnormalities of gait and mobility (R26.89)    Time: 1749-4496 PT Time Calculation (min) (ACUTE ONLY): 23 min   Charges:   PT Evaluation $PT Eval Moderate Complexity: 1 Mod PT Treatments $Gait Training: 8-22 mins        Sherie Don, PT, DPT  Acute Rehabilitation Services Pager [phone] Office [place] 03/18/2018, 10:19 AM

## 2018-03-19 ENCOUNTER — Inpatient Hospital Stay (HOSPITAL_COMMUNITY): Payer: Medicare HMO

## 2018-03-19 ENCOUNTER — Other Ambulatory Visit (HOSPITAL_COMMUNITY): Payer: Medicare HMO

## 2018-03-19 DIAGNOSIS — I6350 Cerebral infarction due to unspecified occlusion or stenosis of unspecified cerebral artery: Secondary | ICD-10-CM

## 2018-03-19 LAB — COMPREHENSIVE METABOLIC PANEL
ALT: 17 U/L (ref 0–44)
AST: 20 U/L (ref 15–41)
Albumin: 3.6 g/dL (ref 3.5–5.0)
Alkaline Phosphatase: 58 U/L (ref 38–126)
Anion gap: 7 (ref 5–15)
BUN: 21 mg/dL (ref 8–23)
CO2: 21 mmol/L — ABNORMAL LOW (ref 22–32)
CREATININE: 1.33 mg/dL — AB (ref 0.44–1.00)
Calcium: 8.9 mg/dL (ref 8.9–10.3)
Chloride: 110 mmol/L (ref 98–111)
GFR calc Af Amer: 43 mL/min — ABNORMAL LOW (ref >60)
GFR calc non Af Amer: 37 mL/min — ABNORMAL LOW (ref >60)
Glucose, Bld: 126 mg/dL — ABNORMAL HIGH (ref 70–99)
Potassium: 3.9 mmol/L (ref 3.5–5.1)
Sodium: 138 mmol/L (ref 135–145)
Total Bilirubin: 0.7 mg/dL (ref 0.3–1.2)
Total Protein: 6.7 g/dL (ref 6.5–8.1)

## 2018-03-19 LAB — HEMOGLOBIN A1C
HEMOGLOBIN A1C: 9.4 % — AB (ref 4.8–5.6)
MEAN PLASMA GLUCOSE: 223.08 mg/dL

## 2018-03-19 LAB — LIPID PANEL
Cholesterol: 174 mg/dL (ref 0–200)
HDL: 29 mg/dL — ABNORMAL LOW (ref >40)
LDL Cholesterol: 83 mg/dL (ref 0–99)
Total CHOL/HDL Ratio: 6 RATIO
Triglycerides: 312 mg/dL — ABNORMAL HIGH (ref <150)
VLDL: 62 mg/dL — ABNORMAL HIGH (ref 0–40)

## 2018-03-19 LAB — GLUCOSE, CAPILLARY
Glucose-Capillary: 310 mg/dL — ABNORMAL HIGH (ref 70–99)
Glucose-Capillary: 154 mg/dL — ABNORMAL HIGH (ref 70–99)
Glucose-Capillary: 234 mg/dL — ABNORMAL HIGH (ref 70–99)

## 2018-03-19 LAB — CBC WITH DIFFERENTIAL/PLATELET
ABS IMMATURE GRANULOCYTES: 0.01 10*3/uL (ref 0.00–0.07)
Basophils Absolute: 0 10*3/uL (ref 0.0–0.1)
Basophils Relative: 0 %
Eosinophils Absolute: 0.2 10*3/uL (ref 0.0–0.5)
Eosinophils Relative: 3 %
HEMATOCRIT: 35.4 % — AB (ref 36.0–46.0)
Hemoglobin: 12 g/dL (ref 12.0–15.0)
Immature Granulocytes: 0 %
Lymphocytes Relative: 44 %
Lymphs Abs: 2.5 10*3/uL (ref 0.7–4.0)
MCH: 32.9 pg (ref 26.0–34.0)
MCHC: 33.9 g/dL (ref 30.0–36.0)
MCV: 97 fL (ref 80.0–100.0)
MONO ABS: 0.6 10*3/uL (ref 0.1–1.0)
Monocytes Relative: 11 %
Neutro Abs: 2.4 10*3/uL (ref 1.7–7.7)
Neutrophils Relative %: 42 %
Platelets: 241 10*3/uL (ref 150–400)
RBC: 3.65 MIL/uL — ABNORMAL LOW (ref 3.87–5.11)
RDW: 12 % (ref 11.5–15.5)
WBC: 5.7 10*3/uL (ref 4.0–10.5)
nRBC: 0 % (ref 0.0–0.2)

## 2018-03-19 LAB — MAGNESIUM: Magnesium: 1.7 mg/dL (ref 1.7–2.4)

## 2018-03-19 LAB — PHOSPHORUS: Phosphorus: 3.1 mg/dL (ref 2.5–4.6)

## 2018-03-19 MED ORDER — WHITE PETROLATUM EX OINT
TOPICAL_OINTMENT | CUTANEOUS | Status: AC
  Administered 2018-03-19: 0.2
  Filled 2018-03-19: qty 28.35

## 2018-03-19 NOTE — Progress Notes (Signed)
  Echocardiogram 2D Echocardiogram has been performed.  Sandra [name] 03/19/2018, 4:51 PM

## 2018-03-19 NOTE — Care Management Note (Signed)
Case Management Note  Patient Details  Name: Sandra [name] MRN: [id number] Date of Birth: [birth year]  Subjective/Objective:                    Action/Plan:  Noted complete referral to outpatient therapy. Spoke w patient at bedside who denied difficulties obtaining medications. Probed further and she said "It's getting to where my insulin is too expensive." Patient has coverage with with [employer], no financial assistance available.  Anticipate return to home w daughter when medically stable.  Expected Discharge Date:                  Expected Discharge Plan:  Home/Self Care  In-House Referral:     Discharge planning Services  CM Consult  Post Acute Care Choice:    Choice offered to:     DME Arranged:    DME Agency:     HH Arranged:    [place] Agency:     Status of Service:  Completed, signed off  If discussed at [employer] of Stay Meetings, dates discussed:    Additional Comments:  Carles Collet, RN 03/19/2018, 11:42 AM

## 2018-03-19 NOTE — Progress Notes (Signed)
PROGRESS NOTE    Sandra [name]  FMB:[id number] DOB: [birth year] DOA: 03/17/2018 PCP: Nickola Major, MD   Brief Narrative:  HPI per Dr. Mitzi Hansen on 03/18/2018 Sandra [name] is a 85 y.o. female with medical history significant for insulin-dependent diabetes mellitus, depression with anxiety, hypertension, and chronic kidney disease stage III, now presenting to the emergency department with slurred speech, frontal headache, perioral numbness, and possible left-sided weakness and right facial droop.  Patient has been under a lot of stress related to caring for an ill relative and has not taken her medications in 3 days secondary to this.  Symptoms developed acutely shortly prior to arrival in the ED.  EMS was called, found her blood pressure to be 214/101, and brought her into the ED for further evaluation.  She denies any chest pain, palpitations, lower extremity swelling or tenderness, or fevers or chills.  **Confirmed that she has a stroke and she is undergoing stroke work-up.  Neurology is following. Symptoms have now resolved her blood pressure is now better  Assessment & Plan:   Principal Problem:   Acute encephalopathy Active Problems:   Chronic kidney disease (CKD), stage III (moderate) (HCC)   Diabetes mellitus type 2 with retinopathy (HCC)   CAD (coronary artery disease)   Severe hypertension   Encephalopathy, hypertensive  Acute encephalopathy; Suspected Hypertensive Encephalopathy, improved  - Presented with dysarthria, left-sided weakness and numbness, and right facial droop  -SBP was 222 on arrival and symptoms resolved with BP control; Neurology recommending Allowing for permissive HTN for the first 24-48 hours and treat ONLY if SBP>220 mmHg and then recommend BP to be normalized to SBP<140 at D/C  -No acute findings on head CT  -MRI showed Punctate Right Occipital Area of Restricted Diffusion that does not correlate with the patient's symptoms and likely is incidental  given Uncontrolled BP's and Uncontrolled Sugars -Neurology consulting and much appreciated and recommending optimization of Stroke Risk Factors -Per Neurology allow for Permissive HTN  Acute Ischemic CVA -Presented with dysarthria, left-sided weakness and numbness, and right facial droop  -Head CT and Cervical Spine showed Mild chronic ischemic white matter disease. No acute intracranial abnormality seen. Multilevel degenerative disc disease is noted. Status post surgical anterior fusion of C6-7. No acute abnormality seen in the cervical spine. -MRI of Brain showed Possible tiny acute infarct in the right occipital cortex. Given the location this could be an artifact however is seen on axial and coronal diffusion imaging suggesting this may be an acute infarct. Moderate chronic microvascular ischemic change in the white matter. -Neurology Consulted and recommending Continued CVA Workup -Continue telemetry monitoring -Neurology recommending getting an MRA of the head without contrast; MRA Head w/o Contrast showed Normal intracranial MRA. -Neurology also recommended carotid Dopplers;  -Echocardiogram also ordered and pending to be done  -Carotid Dopplers preliminary showed Right and Left ICA consistent 1-39% Stenosis and Bilateral Vertebral Arteries demonstrate antegrade flow -Hemoglobin A1c was 9.3; Fasting lipid panel will be checked showed total cholesterol/HDL ratio of 6.0, cholesterol level of 174, HDL 29, LDL of 83, triglycerides 312, and VLDL of 62 -Continue with neurochecks per protocol -Patient was started on Aspirin 325 mg p.o. 300 mg PR and consideration will be made for dual antiplatelet therapy based on vessel imaging but Neurology recommending 81 mg po Daily -Patient was placed on Atorvastatin 80 mg p.o. daily and therapies have been consulted and PT is recommending outpatient PT  And OT recommending no follow up  -Continue Risk Factor Modification -Stroke  Team to evaluate and  recommending 30 Day Cardiac Monitor and resumption of 81 mg of ASA  Mild AKI on CKD stage III-IV, improved -SCr is 1.83, up from 1.50 in October; Repeat this AM was improved and is 21/1.33 -Renally-dose medications, Avoid Contrast Dyes -Continue to hold Lisinopril for now  -Repeat CMP in AM    Insulin-Dependent DM  -HbA1c was 8.3% in May and repeat this visit was 9.3 -Managed at home with Glimepiride, Novolin R 24-27 units TID, and Novolin N 40 units qAM and 18 units qHS  - Check CBG's and use Levemir with mealtime and correctional Novolog while in hospital  -Currently is on 30 units Levemir BID, Moderate Novolog SSI AC/HS and 6 units sq TIDwm -Blood Sugars have been ranging from 157-310 -Diabetes Education Coordinator Consulted for further evaluation and recommendations -Continue adjust Insulin Regimen as Necessary  CAD/Hx of Takotsubo Cardiomyopathy -Currently has No anginal complaints  -Continue ASA but will increase to 325 (was on 81 mg PTA), Atorvastatin 20 mg increased to 80 mg at the recommendation of Neurology -C/w Beta-blocker Metoprolol XL 50 mg po Daily    Hypokalemia -Patient's K+ was 3.4 this AM and improved to 3.9 -Continue to Monitor and Replete as Necessary -Repeat CMP in AM  Normocytic Anemia  -Patient's Hb/Hct went from 13.4/40.8 -> 11.7/33.8 -> 12.0/35.4 -Continue to Monitor for S/Sx of Bleeding -Check Anemia Panel in the AM  -Repeat CBC in AM   Depression and Anxiety -C/w Clonazepam 0.5 mg po BIDprn Anxiety   GERD/Acid Reflux -C/w Famotidine 20 mg po Daily   HLD -Checked Lipid Panel; Fasting lipid panel will be checked showed total cholesterol/HDL ratio of 6.0, cholesterol level of 174, HDL 29, LDL of 83, triglycerides 312, and VLDL of 62-C/w increased Atorvastatin at 80 mg Daily  -C/w Fenofibrate 160 mg po daily   Obesity -Estimated body mass index is 30.14 kg/m as calculated from the following:   Height as of this encounter: 5' (1.524 m).    Weight as of this encounter: 70 kg. -Weight Loss and Dietary Counseling given -C/w Ensure Enlive 237 mL po BID between meals  DVT prophylaxis: Heparin 5,000 units sq q8h Code Status: FULL CODE Family Communication: No family present at bedside  Disposition Plan: Pending further Neurological Workup and Clearance  Consultants:   Neurology    Procedures:  ECHOCARDIOGRAM ordered and pending  CAROTID DOPPLERS Preliminary  Summary: Right Carotid: Velocities in the right ICA are consistent with a 1-39% stenosis.  Left Carotid: Velocities in the left ICA are consistent with a 1-39% stenosis.  Vertebrals: Bilateral vertebral arteries demonstrate antegrade flow.  Antimicrobials:  Anti-infectives (From admission, onward)   None     Subjective: Seen and Examined at bedside and and states her symptoms had resolved.  No chest pain, lightheadedness or dizziness.  No nausea or vomiting.  Feels well today wanting to go home.  No other concerns or complaints at this time.  Echocardiogram still pending to be done.   Objective: Vitals:   03/19/18 0900 03/19/18 0912 03/19/18 1000 03/19/18 1145  BP:   (!) 154/64 (!) 146/54  Pulse:  79  72  Resp: (!) 30 18 (!) 21 (!) 24  Temp:    98.2 F (36.8 C)  TempSrc:    Oral  SpO2:  96%  98%  Weight:      Height:        Intake/Output Summary (Last 24 hours) at 03/19/2018 1454 Last data filed at 03/19/2018 1000 Gross per  24 hour  Intake 200 ml  Output -  Net 200 ml   Filed Weights   03/17/18 2000 03/18/18 0008  Weight: 69.7 kg 70 kg   Examination: Physical Exam:  Constitutional: Well-nourished, well-developed obese Caucasian female currently in no acute distress appears calm and comfortable in the bed Eyes: Lids and conjunctive are normal.  Sclera anicteric ENMT: External ears and nose appear normal.  Grossly normal hearing.  Mucous members are moist Neck: Appears supple no JVD Respiratory: Diminished auscultation bilaterally no  appreciable wheezing, rales, rhonchi.  Patient was tachypneic wheezing and accessory muscle breathe Cardiovascular: RRR; no murmurs, rubs, gallops.  Mild lower extremity edema Abdomen: Soft, nontender, distended secondary to body habitus. GU: Deferred Musculoskeletal: No clubbing or cyanosis.  No joint deformities noted in upper extremities Skin: No appreciable rashes or lesions on to skin evaluation Neurologic: Cranial nerves II through XII grossly intact no appreciable focal deficits.  Romberg sign and cerebellar reflexes were not assessed Psychiatric: Normal judgment and insight.  Patient is awake and alert and oriented x3.  Pleasant mood and affect  Data Reviewed: I have personally reviewed following labs and imaging studies  CBC: Recent Labs  Lab 03/17/18 2107 03/18/18 0325 03/19/18 0521  WBC 6.6 7.4 5.7  NEUTROABS 3.1  --  2.4  HGB 13.4 11.7* 12.0  HCT 40.8 33.8* 35.4*  MCV 99.0 96.8 97.0  PLT 251 236 030   Basic Metabolic Panel: Recent Labs  Lab 03/17/18 2107 03/17/18 2117 03/18/18 0325 03/19/18 0521  NA 137  --  135 138  K 4.3  --  3.4* 3.9  CL 103  --  104 110  CO2 22  --  21* 21*  GLUCOSE 365*  --  263* 126*  BUN 34*  --  29* 21  CREATININE 1.83* 1.90* 1.58* 1.33*  CALCIUM 9.3  --  8.7* 8.9  MG  --   --   --  1.7  PHOS  --   --   --  3.1   GFR: Estimated Creatinine Clearance: 28.5 mL/min (A) (by C-G formula based on SCr of 1.33 mg/dL (H)). Liver Function Tests: Recent Labs  Lab 03/17/18 2107 03/19/18 0521  AST 22 20  ALT 18 17  ALKPHOS 62 58  BILITOT 0.8 0.7  PROT 7.2 6.7  ALBUMIN 4.0 3.6   No results for input(s): LIPASE, AMYLASE in the last 168 hours. No results for input(s): AMMONIA in the last 168 hours. Coagulation Profile: Recent Labs  Lab 03/17/18 2107  INR 1.1   Cardiac Enzymes: No results for input(s): CKTOTAL, CKMB, CKMBINDEX, TROPONINI in the last 168 hours. BNP (last 3 results) No results for input(s): PROBNP in the last 8760  hours. HbA1C: Recent Labs    03/18/18 0325 03/19/18 0521  HGBA1C 9.3* 9.4*   CBG: Recent Labs  Lab 03/18/18 0825 03/18/18 1218 03/18/18 1743 03/18/18 2047 03/19/18 1202  GLUCAP 218* 252* 157* 173* 310*   Lipid Profile: Recent Labs    03/19/18 0521  CHOL 174  HDL 29*  LDLCALC 83  TRIG 312*  CHOLHDL 6.0   Thyroid Function Tests: No results for input(s): TSH, T4TOTAL, FREET4, T3FREE, THYROIDAB in the last 72 hours. Anemia Panel: No results for input(s): VITAMINB12, FOLATE, FERRITIN, TIBC, IRON, RETICCTPCT in the last 72 hours. Sepsis Labs: No results for input(s): PROCALCITON, LATICACIDVEN in the last 168 hours.  Recent Results (from the past 240 hour(s))  Culture, blood (routine x 2)     Status: None (Preliminary result)  Collection Time: 03/18/18  3:24 AM  Result Value Ref Range Status   Specimen Description BLOOD RIGHT HAND  Final   Special Requests   Final    BOTTLES DRAWN AEROBIC AND ANAEROBIC Blood Culture adequate volume Performed at [facility], [address] [place], [place] [address]    Culture NO GROWTH < 12 HOURS  Final   Report Status PENDING  Incomplete  Culture, blood (routine x 2)     Status: None (Preliminary result)   Collection Time: 03/18/18  3:34 AM  Result Value Ref Range Status   Specimen Description BLOOD LEFT HAND  Final   Special Requests   Final    BOTTLES DRAWN AEROBIC ONLY Blood Culture results may not be optimal due to an inadequate volume of blood received in culture bottles Performed at [facility] [address] [place],  [address]    Culture NO GROWTH < 12 HOURS  Final   Report Status PENDING  Incomplete    Radiology Studies: Mr Virgel Paling LH Contrast  Result Date: 03/18/2018 CLINICAL DATA:  Stroke follow-up EXAM: MRA HEAD WITHOUT CONTRAST TECHNIQUE: Angiographic images of the Circle of Willis were obtained using MRA technique without intravenous contrast. COMPARISON:  None. FINDINGS: POSTERIOR  CIRCULATION: --Basilar artery: Normal. --Posterior cerebral arteries: Normal. The right PCA is predominantly supplied by the posterior communicating artery. --Superior cerebellar arteries: Normal. --Inferior cerebellar arteries: Normal anterior and posterior inferior cerebellar arteries. ANTERIOR CIRCULATION: --Intracranial internal carotid arteries: Normal. --Anterior cerebral arteries: Normal. Both A1 segments are present. Patent anterior communicating artery. --Middle cerebral arteries: Normal. --Posterior communicating arteries: Present on the right, absent on the left. IMPRESSION: Normal intracranial MRA. Electronically Signed   By: Ulyses Jarred M.D.   On: 03/18/2018 19:38   Mr Brain Wo Contrast  Result Date: 03/18/2018 CLINICAL DATA:  Focal neuro deficit less than 6 hours. Diabetes hypertension chronic kidney disease. Slurred speech headache. Possible left-sided weakness and right facial droop. EXAM: MRI HEAD WITHOUT CONTRAST TECHNIQUE: Multiplanar, multiecho pulse sequences of the brain and surrounding structures were obtained without intravenous contrast. COMPARISON:  CT head 03/17/2018 FINDINGS: Brain: Possible tiny acute infarct in the right occipital lobe. This is cortically based and could be an artifact however is seen on coronal and axial diffusion. No other acute infarct. Mild to moderate chronic white matter changes. No cortical infarct. Negative for hemorrhage or mass. Ventricle size and cerebral volume normal for age. Vascular: Normal arterial flow voids Skull and upper cervical spine: Negative Sinuses/Orbits: Mild mucosal edema paranasal sinuses. Bilateral cataract surgery. Other: None IMPRESSION: 1. Possible tiny acute infarct in the right occipital cortex. Given the location this could be an artifact however is seen on axial and coronal diffusion imaging suggesting this may be an acute infarct. 2. Moderate chronic microvascular ischemic change in the white matter. Electronically Signed    By: Franchot Gallo M.D.   On: 03/18/2018 06:53   Ct Head Code Stroke Wo Contrast  Result Date: 03/17/2018 CLINICAL DATA:  Code stroke.  Left facial droop and weakness. EXAM: CT HEAD WITHOUT CONTRAST TECHNIQUE: Contiguous axial images were obtained from the base of the skull through the vertex without intravenous contrast. COMPARISON:  Head CT 11/15/2017 FINDINGS: Brain: There is no mass, hemorrhage or extra-axial collection. The size and configuration of the ventricles and extra-axial CSF spaces are normal. There is hypoattenuation of the periventricular white matter, most commonly indicating chronic ischemic microangiopathy. Unchanged old left basal ganglia lacunar infarcts. Vascular: No abnormal hyperdensity  of the major intracranial arteries or dural venous sinuses. No intracranial atherosclerosis. Skull: The visualized skull base, calvarium and extracranial soft tissues are normal. Sinuses/Orbits: No fluid levels or advanced mucosal thickening of the visualized paranasal sinuses. No mastoid or middle ear effusion. The orbits are normal. ASPECTS [facility] Stroke Program Early CT Score) - Ganglionic level infarction (caudate, lentiform nuclei, internal capsule, insula, M1-M3 cortex): 7 - Supraganglionic infarction (M4-M6 cortex): 3 Total score (0-10 with 10 being normal): 10 IMPRESSION: 1. No hemorrhage or other acute finding. 2. ASPECTS is 10. These results were communicated to Dr. Kerney Elbe at 9:10 pm on 03/17/2018 by text page via the [facility] messaging system. Electronically Signed   By: Ulyses Jarred M.D.   On: 03/17/2018 21:11   Vas US Carotid  Result Date: 03/18/2018 Carotid Arterial Duplex Study Indications:       CVA, Speech disturbance and Stroke. Limitations:       Shadowing Comparison Study:  Carotid duplex on 11/28/2010 Performing Technologist: Rudell Cobb  Examination Guidelines: A complete evaluation includes B-mode imaging, spectral Doppler, color Doppler, and power Doppler as needed of all  accessible portions of each vessel. Bilateral testing is considered an integral part of a complete examination. Limited examinations for reoccurring indications may be performed as noted.  Right Carotid Findings: +----------+--------+--------+--------+-------------------------------+--------+           PSV cm/sEDV cm/sStenosisDescribe                       Comments +----------+--------+--------+--------+-------------------------------+--------+ CCA Prox  77      15                                                      +----------+--------+--------+--------+-------------------------------+--------+ CCA Distal73      16                                                      +----------+--------+--------+--------+-------------------------------+--------+ ICA Prox  90      22      1-39%   focal, hyperechoic and calcific         +----------+--------+--------+--------+-------------------------------+--------+ ICA Mid   96      24                                                      +----------+--------+--------+--------+-------------------------------+--------+ ICA Distal98      24                                                      +----------+--------+--------+--------+-------------------------------+--------+ ECA       107     9                                                       +----------+--------+--------+--------+-------------------------------+--------+ +----------+--------+-------+--------+-------------------+  PSV cm/sEDV cmsDescribeArm Pressure (mmHG) +----------+--------+-------+--------+-------------------+ [id number]                                         +----------+--------+-------+--------+-------------------+ +---------+--------+--+--------+--+---------+ VertebralPSV cm/s65EDV cm/s17Antegrade +---------+--------+--+--------+--+---------+  Left Carotid Findings:  +----------+-------+--------+--------+--------------------------------+--------+           PSV    EDV cm/sStenosisDescribe                        Comments           cm/s                                                            +----------+-------+--------+--------+--------------------------------+--------+ CCA Prox  109    20                                              tortuous +----------+-------+--------+--------+--------------------------------+--------+ CCA Distal60     12              diffuse and hyperechoic                  +----------+-------+--------+--------+--------------------------------+--------+ ICA Prox  93     21      1-39%   diffuse, heterogenous and                                                 calcific                                 +----------+-------+--------+--------+--------------------------------+--------+ ICA Mid   103    27                                                       +----------+-------+--------+--------+--------------------------------+--------+ ICA Distal115    22                                                       +----------+-------+--------+--------+--------------------------------+--------+ ECA       91     9                                                        +----------+-------+--------+--------+--------------------------------+--------+ +----------+--------+--------+--------+-------------------+ SubclavianPSV cm/sEDV cm/sDescribeArm Pressure (mmHG) +----------+--------+--------+--------+-------------------+           199                                         +----------+--------+--------+--------+-------------------+ +---------+--------+--+--------+--+---------+  VertebralPSV cm/s52EDV cm/s12Antegrade +---------+--------+--+--------+--+---------+  Summary: Right Carotid: Velocities in the right ICA are consistent with a 1-39% stenosis. Left Carotid: Velocities in the left ICA  are consistent with a 1-39% stenosis. Vertebrals: Bilateral vertebral arteries demonstrate antegrade flow. *See table(s) above for measurements and observations.     Preliminary    Scheduled Meds: .  stroke: mapping our early stages of recovery book   Does not apply Once  . aspirin EC  325 mg Oral Daily  . atorvastatin  80 mg Oral q1800  . famotidine  20 mg Oral Daily  . feeding supplement (ENSURE ENLIVE)  237 mL Oral BID BM  . fenofibrate  160 mg Oral Daily  . heparin  5,000 Units Subcutaneous Q8H  . insulin aspart  0-15 Units Subcutaneous TID WC  . insulin aspart  0-5 Units Subcutaneous QHS  . insulin aspart  6 Units Subcutaneous TID WC  . insulin detemir  30 Units Subcutaneous BID  . ketotifen  1 drop Right Eye BID  . metoprolol succinate  50 mg Oral Daily  . mometasone-formoterol  2 puff Inhalation BID  . sodium chloride flush  3 mL Intravenous Q12H  . sodium chloride flush  3 mL Intravenous Q12H   Continuous Infusions: . sodium chloride      LOS: 1 day   Kerney Elbe, DO [facility] PAGER is on AMION  If 7PM-7AM, please contact night-coverage [email or web address] Password [facility] 03/19/2018, 2:54 PM

## 2018-03-19 NOTE — Plan of Care (Signed)
  Problem: Activity: Goal: Risk for activity intolerance will decrease Outcome: Progressing   Problem: Nutrition: Goal: Adequate nutrition will be maintained Outcome: Progressing   Problem: Coping: Goal: Level of anxiety will decrease Outcome: Progressing   

## 2018-03-19 NOTE — Progress Notes (Signed)
STROKE TEAM PROGRESS NOTE   HISTORY OF PRESENT ILLNESS (per record) Sandra [name] is an 85 y.o. female who presented via EMS with acute onset of intermittently slurred speech and left sided weakness/numbness, as well as some right sided facial droop. [facility], which was the same as [facility]. Symptoms resolved at about 1925, then recurred at 1935 en route. Code Stroke was called in the field. BP per EMS was 214/101. On arrival the patient was still symptomatic, with BP of 222/73 and anxious, tearful affect. She complained of a right supraorbital 8/10 nonthrobbing headache in CT. CT head was negative for acute abnormality. Her symptoms resolved after management of her BP with clevidipine drip. The patient's CBG was elevated in the 400's. She states that she had not taken her medications over the past 2 days due to the stress of taking care of her ill sister. Missed medications included her antihypertensives and insulin. She takes ASA on an irregular basis.   She states that she had a TIA in 2012 but cannot recall what the deficits were.   Her PMHx includes multiple stroke risk factors as well as SAH.   LSN: [id number] tPA Given: No: Overall clinical picture most consistent with hypertensive urgency. Symptoms resolved with BP management.    SUBJECTIVE (INTERVAL HISTORY) Her family is not at bedside. She feels well and wants to go home. She had not taken her medications for 3 days prior to hospitalization, stressed out and taking care of sister.    OBJECTIVE Vitals:   03/18/18 1650 03/18/18 2053 03/19/18 0500 03/19/18 0800  BP: (!) 144/72 (!) 143/57 (!) 151/65 (!) 155/50  Pulse:  68 73 79  Resp: 19 19 17 18   Temp:  98 F (36.7 C) 97.8 F (36.6 C) 97.7 F (36.5 C)  TempSrc:  Axillary Axillary Oral  SpO2:  96% 98% 96%  Weight:      Height:        CBC:  Recent Labs  Lab 03/17/18 2107 03/18/18 0325 03/19/18 0521  WBC 6.6 7.4 5.7  NEUTROABS 3.1  --  2.4  HGB 13.4 11.7* 12.0  HCT 40.8 33.8*  35.4*  MCV 99.0 96.8 97.0  PLT 251 236 578    Basic Metabolic Panel:  Recent Labs  Lab 03/18/18 0325 03/19/18 0521  NA 135 138  K 3.4* 3.9  CL 104 110  CO2 21* 21*  GLUCOSE 263* 126*  BUN 29* 21  CREATININE 1.58* 1.33*  CALCIUM 8.7* 8.9  MG  --  1.7  PHOS  --  3.1    Lipid Panel:     Component Value Date/Time   CHOL 174 03/19/2018 0521   TRIG 312 (H) 03/19/2018 0521   HDL 29 (L) 03/19/2018 0521   CHOLHDL 6.0 03/19/2018 0521   VLDL 62 (H) 03/19/2018 0521   LDLCALC 83 03/19/2018 0521   HgbA1c:  Lab Results  Component Value Date   HGBA1C 9.4 (H) 03/19/2018   Urine Drug Screen:     Component Value Date/Time   LABOPIA NONE DETECTED 01/11/2016 1236   COCAINSCRNUR NONE DETECTED 01/11/2016 1236   LABBENZ NONE DETECTED 01/11/2016 1236   AMPHETMU NONE DETECTED 01/11/2016 1236   THCU NONE DETECTED 01/11/2016 1236   LABBARB NONE DETECTED 01/11/2016 1236    Alcohol Level     Component Value Date/Time   [facility] <5 01/11/2016 0955    IMAGING  Mr Jodene Nam Head Wo Contrast 03/18/2018 IMPRESSION:  Normal intracranial MRA.    Mr Brain Wo Contrast 03/18/2018  IMPRESSION:  1. Possible tiny acute infarct in the right occipital cortex. Given the location this could be an artifact however is seen on axial and coronal diffusion imaging suggesting this may be an acute infarct.  2. Moderate chronic microvascular ischemic change in the white matter.    Ct Head Code Stroke Wo Contrast 03/17/2018 IMPRESSION:  1. No hemorrhage or other acute finding.  2. ASPECTS is 10.    Vas US Carotid 03/18/2018 Summary:  Right Carotid: Velocities in the right ICA are consistent with a 1-39% stenosis.  Left Carotid: Velocities in the left ICA - are consistent with a 1-39% stenosis.  Vertebrals: Bilateral vertebral arteries demonstrate antegrade flow.    Transthoracic Echocardiogram  00/00/2020 Pending     PHYSICAL EXAM Blood pressure (!) 155/50, pulse 79, temperature 97.7 F (36.5 C),  temperature source Oral, resp. rate 18, height 5' (1.524 m), weight 70 kg, SpO2 96 %.       ASSESSMENT/PLAN [name] is a 85 y.o. female with history of TIA, Takotsubo cardiomyopathy, SAH hx, Htn, Hld, DM, CAD with Mi, anxiety and depression presenting with intermittently slurred speech and left sided weakness/numbness, as well as some right sided facial droop, HA and BP 214/101. She did not receive IV t-PA due to resolution of deficits.  Stroke:  Possible tiny acute infarct in the right occipital cortex vs artifact.  Resultant  No deficits  CT head  - negative  MRI head - Possible tiny acute infarct in the right occipital cortex vs artifact.  MRA head - normal  CTA H&N - not performed  Carotid Doppler  - unremarkable  2D Echo - pending  LDL - 83  HgbA1c - 9.4  UDS - not performed  VTE prophylaxis - [place] Heparin  Diet - Heart healthy / carb modified with thin liquids.  aspirin 81 mg daily prior to admission, now on aspirin 325 mg daily  Patient counseled to be compliant with her antithrombotic medications. She had missed here ASA 81mg , recommend continuation of ASA 81mg  daily at home  Will need 30-day cardiac monitor outpatient for evaluation of afib/aflutter  Ongoing aggressive stroke risk factor management  Therapy recommendations:  Outpt PT recommended  Disposition:  Pending  Hypertension  Stable . Permissive hypertension (OK if < 220/120) but gradually normalize in 5-7 days . Long-term BP goal normotensive  Hyperlipidemia  Lipid lowering medication PTA: Lipitor 20 mg daily and fenofibrate 160 MG daily  LDL 83, goal < 70  Current lipid lowering medication: Lipitor 80 mg daily and fenofibrate 160 MG daily  Continue statin at discharge  Diabetes  HgbA1c 9.4, goal < 7.0  Uncontrolled  Other Stroke Risk Factors  Advanced age  Obesity, Body mass index is 30.14 kg/m., recommend weight loss, diet and exercise as appropriate   Hx  stroke/TIA  Daughter - [facility]  Coronary artery disease   Other Active Problems  Non compliance with medications  - ? Financial concerns ?  Creatinine - 1.33  If echocardiogram unremarkable stroke ill sign off. Will need f/u with Dr. Leonie Man outpatient at [facility] in 8 weeks. Needs outpatient 30-day heart monitor.   Hospital day # 1  Personally examined patient and images, and have participated in and made any corrections needed to history, physical, neuro exam,assessment and plan as stated above.  I have personally obtained the history, evaluated lab date, reviewed imaging studies and agree with radiology interpretations.    Sarina Ill, MD Stroke Neurology   A total of 25 minutes  was spent for the care of this patient, spent on counseling patient and family on different diagnostic and therapeutic options, counseling and coordination of care, riskd ans benefits of management, compliance, or risk factor reduction and education.   To contact Stroke Continuity provider, please refer to [email or web address] After hours, contact General Neurology

## 2018-03-19 NOTE — Progress Notes (Signed)
Inpatient Diabetes Program Recommendations  AACE/ADA: New Consensus Statement on Inpatient Glycemic Control (2015)  Target Ranges:  Prepandial:   less than 140 mg/dL      Peak postprandial:   less than 180 mg/dL (1-2 hours)      Critically ill patients:  140 - 180 mg/dL  Results for Sandra [name], Sandra [name] (MRN [id number]) as of 03/19/2018 10:05  Ref. Range 03/19/2018 05:21  Glucose Latest Ref Range: 70 - 99 mg/dL 126 (H)   Results for Sandra [name], Sandra [name] (MRN [id number]) as of 03/19/2018 10:05  Ref. Range 03/18/2018 08:25 03/18/2018 12:18 03/18/2018 17:43 03/18/2018 20:47  Glucose-Capillary Latest Ref Range: 70 - 99 mg/dL 218 (H) 252 (H) 157 (H) 173 (H)    Review of Glycemic Control  Diabetes history: DM2 Outpatient Diabetes medications: NPH 40 units QAM, NPH 18 units QPM, Regular 24 units TID with meals, Amaryl 2mg   BID. Ordered: Levemir 30 BID, 0-15 TID, 0-5 HS, 6 TID MC. Current orders for Inpatient glycemic control: Levemir 30 units BID, Novolog 6 units TID with meals, Novolog 0-15 units TID with meals, Novolog 0-5 units QHS  NOTE: Noted consult for Diabetes Coordinator. Diabetes Coordinator is not on campus over the weekend but available by pager from 8am to 5pm for questions or concerns. Chart reviewed. Agree with current insulin orders.  Thanks, Barnie Alderman, RN, MSN, CDE Diabetes Coordinator Inpatient Diabetes Program [phone] (Team Pager from 8am to 5pm)

## 2018-03-19 NOTE — Evaluation (Signed)
Occupational Therapy Evaluation Patient Details Name: Sandra [name] MRN: [id number] DOB: 1933/01/21 Today's Date: 03/19/2018    History of Present Illness Pt is an 85 y/o female admitted secondary to HA, perioral numbness and facial droop. MRI of the brain revealed Possible tiny acute infarct in the right occipital cortex. Given the location this could be an artifact however is seen on axial and coronal diffusion imaging suggesting this may be an acute infarct. PMH including but not limited to DM and HTN.   Clinical Impression   This 85 y/o female presents with the above. At baseline pt is independent with ADL, iADL and functional mobility, though does report history of falls at home. Pt presents supine in bed and willing to participate in therapy. She completed room and hallway level functional mobility without AD and overall minguard assist. Demonstrating toileting, LB and standing grooming ADL with supervision-minguard throughout. Pt with intermittent mild unsteadiness but with no overt LOB and pt able to self-correct. Pt reports she lives at home with daughter and granddaughter. She will benefit from continued acute OT services to maximize her safety and independence with ADL and mobility prior to discharge home. Will follow.     Follow Up Recommendations  No OT follow up;Supervision/Assistance - 24 hour(24hr initially)    Equipment Recommendations  None recommended by OT           Precautions / Restrictions Precautions Precautions: Fall Restrictions Weight Bearing Restrictions: No      Mobility Bed Mobility Overal bed mobility: Needs Assistance Bed Mobility: Supine to Sit     Supine to sit: Supervision     General bed mobility comments: for safety  Transfers Overall transfer level: Needs assistance Equipment used: None Transfers: Sit to/from Stand Sit to Stand: Min guard         General transfer comment: min guard for safety with transition into standing from  EOB    Balance Overall balance assessment: Needs assistance Sitting-balance support: Feet supported Sitting balance-Leahy Scale: Good     Standing balance support: No upper extremity supported Standing balance-Leahy Scale: Good                             ADL either performed or assessed with clinical judgement   ADL Overall ADL's : Needs assistance/impaired Eating/Feeding: Modified independent;Sitting   Grooming: Supervision/safety;Wash/dry face;Standing   Upper Body Bathing: Sitting;Modified independent   Lower Body Bathing: Min guard;Sit to/from stand   Upper Body Dressing : Modified independent;Sitting   Lower Body Dressing: Min guard;Sit to/from stand Lower Body Dressing Details (indicate cue type and reason): pt donning underwear, minguard for standing balance  Toilet Transfer: Min guard;Ambulation;Regular Glass blower/designer Details (indicate cue type and reason): pt with slight LOB leaving bathroom but able to self correct, minguard for safety Toileting- Clothing Manipulation and Hygiene: Min guard;Sit to/from stand Toileting - Clothing Manipulation Details (indicate cue type and reason): performing pericare, clothing management including gowns and underwear     Functional mobility during ADLs: Min guard       Vision         Perception     Praxis      Pertinent Vitals/Pain       Hand Dominance Right   Extremity/Trunk Assessment Upper Extremity Assessment Upper Extremity Assessment: Overall WFL for tasks assessed   Lower Extremity Assessment Lower Extremity Assessment: Defer to PT evaluation   Cervical / Trunk Assessment Cervical / Trunk Assessment: Kyphotic  Communication Communication Communication: No difficulties   Cognition Arousal/Alertness: Awake/alert Behavior During Therapy: WFL for tasks assessed/performed Overall Cognitive Status: Within Functional Limits for tasks assessed                                  General Comments: pt seemed a little confused initially at start of session, improved with session progression and overall appears [facility] for functional tasks - will continue to assess higher level    General Comments       Exercises     Shoulder Instructions      Home Living Family/patient expects to be discharged to:: Private residence Living Arrangements: Children;Other relatives Available Help at Discharge: Family;Available PRN/intermittently Type of Home: House Home Access: Stairs to enter [employer] of Steps: 2 Entrance Stairs-Rails: None Home Layout: One level     Bathroom Shower/Tub: Tub/shower unit         Home Equipment: Clinical cytogeneticist - 2 wheels          Prior Functioning/Environment Level of Independence: Independent        Comments: drives        OT Problem List: Decreased strength;Decreased activity tolerance;Impaired balance (sitting and/or standing)      OT Treatment/Interventions: Self-care/ADL training;Therapeutic exercise;Neuromuscular education;DME and/or AE instruction;Therapeutic activities;Cognitive remediation/compensation;Balance training    OT Goals(Current goals can be found in the care plan section) Acute Rehab OT Goals Patient Stated Goal: stop falling OT Goal Formulation: With patient Time For Goal Achievement: 04/02/18 Potential to Achieve Goals: Good  OT Frequency: Min 2X/week   Barriers to D/C:            Co-evaluation              AM-PAC OT "6 Clicks" Daily Activity     Outcome Measure Help from another person eating meals?: None Help from another person taking care of personal grooming?: None Help from another person toileting, which includes using toliet, bedpan, or urinal?: None Help from another person bathing (including washing, rinsing, drying)?: A Little Help from another person to put on and taking off regular upper body clothing?: None Help from another person to put on and taking off  regular lower body clothing?: A Little 6 Click Score: 22   End of Session Equipment Utilized During Treatment: Gait belt Nurse Communication: Mobility status  Activity Tolerance: Patient tolerated treatment well Patient left: in chair;with call bell/phone within reach;with chair alarm set  OT Visit Diagnosis: Unsteadiness on feet (R26.81)                Time: 2229-7989 OT Time Calculation (min): 28 min Charges:  OT General Charges $OT Visit: 1 Visit OT Evaluation $OT Eval Moderate Complexity: 1 Mod OT Treatments $Self Care/Home Management : 8-22 mins  Lou Cal, OT Supplemental Rehabilitation Services Pager [phone] Office [phone]  Raymondo Band 03/19/2018, 10:48 AM

## 2018-03-20 LAB — CBC WITH DIFFERENTIAL/PLATELET
Abs Immature Granulocytes: 0 10*3/uL (ref 0.00–0.07)
Basophils Absolute: 0 10*3/uL (ref 0.0–0.1)
Basophils Relative: 0 %
Eosinophils Absolute: 0.2 10*3/uL (ref 0.0–0.5)
Eosinophils Relative: 3 %
HCT: 37.6 % (ref 36.0–46.0)
Hemoglobin: 12.3 g/dL (ref 12.0–15.0)
Immature Granulocytes: 0 %
LYMPHS PCT: 45 %
Lymphs Abs: 2.4 10*3/uL (ref 0.7–4.0)
MCH: 32.6 pg (ref 26.0–34.0)
MCHC: 32.7 g/dL (ref 30.0–36.0)
MCV: 99.7 fL (ref 80.0–100.0)
Monocytes Absolute: 0.6 10*3/uL (ref 0.1–1.0)
Monocytes Relative: 11 %
Neutro Abs: 2.2 10*3/uL (ref 1.7–7.7)
Neutrophils Relative %: 41 %
Platelets: 242 10*3/uL (ref 150–400)
RBC: 3.77 MIL/uL — ABNORMAL LOW (ref 3.87–5.11)
RDW: 12 % (ref 11.5–15.5)
WBC: 5.3 10*3/uL (ref 4.0–10.5)
nRBC: 0 % (ref 0.0–0.2)

## 2018-03-20 LAB — GLUCOSE, CAPILLARY
Glucose-Capillary: 222 mg/dL — ABNORMAL HIGH (ref 70–99)
Glucose-Capillary: 383 mg/dL — ABNORMAL HIGH (ref 70–99)

## 2018-03-20 LAB — COMPREHENSIVE METABOLIC PANEL
ALBUMIN: 3.4 g/dL — AB (ref 3.5–5.0)
ALT: 16 U/L (ref 0–44)
AST: 23 U/L (ref 15–41)
Alkaline Phosphatase: 58 U/L (ref 38–126)
Anion gap: 7 (ref 5–15)
BUN: 27 mg/dL — ABNORMAL HIGH (ref 8–23)
CALCIUM: 9.1 mg/dL (ref 8.9–10.3)
CO2: 22 mmol/L (ref 22–32)
Chloride: 108 mmol/L (ref 98–111)
Creatinine, Ser: 1.45 mg/dL — ABNORMAL HIGH (ref 0.44–1.00)
GFR calc Af Amer: 39 mL/min — ABNORMAL LOW (ref >60)
GFR calc non Af Amer: 33 mL/min — ABNORMAL LOW (ref >60)
Glucose, Bld: 205 mg/dL — ABNORMAL HIGH (ref 70–99)
Potassium: 4.5 mmol/L (ref 3.5–5.1)
Sodium: 137 mmol/L (ref 135–145)
Total Bilirubin: 0.7 mg/dL (ref 0.3–1.2)
Total Protein: 6.5 g/dL (ref 6.5–8.1)

## 2018-03-20 LAB — ECHOCARDIOGRAM COMPLETE
Height: 60 in
Weight: 2469.15 oz

## 2018-03-20 LAB — MAGNESIUM: Magnesium: 1.9 mg/dL (ref 1.7–2.4)

## 2018-03-20 MED ORDER — STROKE: EARLY STAGES OF RECOVERY BOOK: 1.0000 | Freq: Once | 0 refills | Status: AC

## 2018-03-20 MED ORDER — SENNOSIDES-DOCUSATE SODIUM 8.6-50 MG PO TABS: 1.0000 | ORAL_TABLET | Freq: Every evening | ORAL | 0 refills | Status: DC | PRN

## 2018-03-20 MED ORDER — ATORVASTATIN CALCIUM 40 MG PO TABS: 40.0000 mg | ORAL_TABLET | Freq: Every day | ORAL | Status: DC

## 2018-03-20 MED ORDER — ENSURE ENLIVE PO LIQD: 237.0000 mL | Freq: Two times a day (BID) | ORAL | 12 refills | Status: DC

## 2018-03-20 MED ORDER — ATORVASTATIN CALCIUM 40 MG PO TABS: 40.0000 mg | ORAL_TABLET | Freq: Every day | ORAL | 0 refills | Status: DC

## 2018-03-20 NOTE — Evaluation (Signed)
Speech Language Pathology Evaluation Patient Details Name: Sandra [name] MRN: [id number] DOB: 04-May-1933 Today's Date: 03/20/2018 Time: 4970-2637 SLP Time Calculation (min) (ACUTE ONLY): 20 min  Problem List:  Patient Active Problem List   Diagnosis Date Noted  . Severe hypertension 03/18/2018  . Encephalopathy, hypertensive   . Acute encephalopathy 03/17/2018  . Scleroderma ([place]) 02/28/2016  . Dyspnea 02/10/2016  . Abnormal blood finding 02/10/2016  . CAD (coronary artery disease) 02/10/2016  . Osteoarthritis 02/10/2016  . Nodule on liver 12/06/2015  . ILD (interstitial lung disease) ([place]) 05/31/2015  . Type 2 diabetes mellitus with hyperglycemia ([place]) 03/11/2015  . Diabetes mellitus type 2 with retinopathy ([place]) 03/04/2015  . Vitamin D deficiency 10/01/2014  . Leukocytopenia 05/31/2014  . Chronic kidney disease (CKD), stage III (moderate) ([place]) 05/31/2014  . Fatigue 05/31/2014  . Lower abdominal pain 05/17/2014  . Nausea with vomiting 09/22/2013  . Chronic right SI joint pain 09/22/2013  . 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 Abrasion of ear canal 09/28/2012  . History of subarachnoid hemorrhage 04/07/2012  . Unspecified cerebral artery occlusion with cerebral infarction 04/07/2012  . UTI (urinary tract infection) 11/02/2011  . General medical examination 01/07/2011  . Chest pain 11/27/2010  . Achilles tendon injury 11/27/2010  . Back pain 07/01/2010  . Breast pain 04/15/2010  . GAIT DISTURBANCE 08/07/2009  . Anxiety state 07/03/2009  . VALVULAR HEART DISEASE 05/23/2008  . NECK PAIN, CHRONIC 04/03/2008  . DEGENERATIVE JOINT DISEASE, CERVICAL SPINE 06/23/2006  . Osteoporosis 06/23/2006  . Hyperlipidemia associated with type 2 diabetes mellitus ([place]) 05/12/2006  . DEPRESSION 05/12/2006  . Essential hypertension 05/12/2006  . ALLERGIC RHINITIS 05/12/2006  . ACID REFLUX DISEASE 05/12/2006  . SCIATICA 05/12/2006   Past Medical History:  Past Medical History:  Diagnosis  Date  . Acid reflux   . Acute MI ([place])   . Allergic rhinitis   . Anxiety   . Degenerative joint disease   . Depression   . Diabetes mellitus    type 2  . Gait disturbance   . GERD (gastroesophageal reflux disease)   . Hyperlipidemia   . Hyperlipidemia   . Hypertension   . Neck pain, chronic   . Osteopenia   . SAH (subarachnoid hemorrhage) ([place])   . Sciatica   . Takotsubo cardiomyopathy    with subsequent normalization of left ventricular systolic function  . TIA (transient ischemic attack)   . Valvular heart disease    Past Surgical History:  Past Surgical History:  Procedure Laterality Date  . ABDOMINAL HYSTERECTOMY  1977   no oophorectomy  . APPENDECTOMY    . CARDIAC CATHETERIZATION    . CATARACT EXTRACTION, BILATERAL  2011  . SPINAL FUSION  2000   Dr. Vertell Limber  . TONSILLECTOMY     HPI:  Pt is an 85 y/o female admitted secondary to HA, perioral numbness and facial droop. MRI of the brain revealed possible tiny acute infarct vs artifact in the right occipital cortex. History includes SAH, TIA, HTN, depression, anxiety. Per SLP normal speech/language/cognitive function 11/28/2010.   Assessment / Plan / Recommendation Clinical Impression   Pt presents with mild cognitive impairment but is at baseline per pt, son. She states she has occasional difficulties with wordfinding, short-term recall. SLP administered MOCA-Basic. Pt scored 24/30 (26 and above is considered within normal limits). She had difficulty with delayed recall and divergent naming, consistent with reported baseline deficits. SLP educated re: supervision initially with tasks such as medication management. Pt, son verbalized understanding. No further  skilled ST needs identified at this time; SLP will s/o.     SLP Assessment  SLP Recommendation/Assessment: Patient does not need any further Speech Lanaguage Pathology Services SLP Visit Diagnosis: Cognitive communication deficit (R41.841)    Follow Up  Recommendations  None;24 hour supervision/assistance(24 hr initially)    Frequency and Duration           SLP Evaluation Cognition  Overall Cognitive Status: History of cognitive impairments - at baseline Arousal/Alertness: Awake/alert Attention: Selective Selective Attention: Appears intact Memory: Impaired Memory Impairment: (immediate recall 3/5, delayed recall 2/5 (3/5 category cue)) Awareness: Appears intact Problem Solving: Appears intact Executive Function: Reasoning Reasoning: Appears intact Safety/Judgment: Appears intact       Comprehension  Auditory Comprehension Overall Auditory Comprehension: Appears within functional limits for tasks assessed Yes/No Questions: Within Functional Limits Commands: Within Functional Limits Conversation: Other (comment)(mod complex) Visual Recognition/Discrimination Discrimination: Within Function Limits Reading Comprehension Reading Status: Within funtional limits    Expression Expression Primary Mode of Expression: Verbal Verbal Expression Overall Verbal Expression: Impaired Level of Generative/Spontaneous Verbalization: Conversation Repetition: No impairment Naming: Impairment Divergent: 25-49% accurate(4 fruits in 60 seconds) Pragmatics: No impairment Written Expression Dominant Hand: Right Written Expression: Not tested   Oral / Motor  Oral Motor/Sensory Function Overall Oral Motor/Sensory Function: Within functional limits Motor Speech Overall Motor Speech: Appears within functional limits for tasks assessed   [place], [place], [place] Pager: [phone] Office: [phone]  Aliene Altes 03/20/2018, 1:44 PM

## 2018-03-20 NOTE — Discharge Summary (Signed)
Physician Discharge Summary  Sandra [name] IHK:[id number] DOB: 1933/07/03 DOA: 03/17/2018  PCP: Sandra Major, MD  Admit date: 03/17/2018 Discharge date: 03/20/2018  Admitted From: Home Disposition: Home with Outpatient PT/OT  Recommendations for Outpatient Follow-up:  1. Follow up with PCP in 1-2 weeks 2. Follow up Neurology Sandra [name]  3. Follow up with Cardiology for further evaluation and 30 Day Event Monitor  4. Please obtain CMP/CBC, Mag, Phos in one week 5. Please follow up on the following pending results:  Home Health: No Equipment/Devices: None    Discharge Condition: Stable  CODE STATUS: FULL CODE Diet recommendation: Heart Health Carb Modified Diet   Brief/Interim Summary: HPI per Sandra [name] on 03/18/2018 Sandra [name] a 86 y.o.femalewith medical history significant forinsulin-dependent diabetes mellitus, depression with anxiety, hypertension, and chronic kidney disease stage III, now presenting to the emergency department with slurred speech,frontal headache,perioral numbness, and possible left-sided weaknessand right facial droop.Patient has been under a lot of stress related to caring for an ill relative and has not taken her medications in 3days secondary to this. Symptoms developed acutely shortly prior to arrival in the ED. EMS was called, found her blood pressure to be 214/101, and brought her into the ED for further evaluation. She denies any chest pain, palpitations, lower extremity swelling or tenderness, or fevers or chills.  **Confirmed that she has a stroke and underwent full stroke work-up.  She is placed on aspirin 81 mg and atorvastatin 40 mg p.o. daily.  She was inconsistently taking aspirin so neurology recommended discontinuing 81 mg of aspirin.  They also recommend a 30-day event monitor.  Her symptoms have improved and she is deemed medically stable to be discharged she will need to follow-up with PCP, cardiology, as well as neurology  in the outpatient setting.  PT OT evaluated and recommended outpatient PT.  Discharge Diagnoses:  Principal Problem:   Acute encephalopathy Active Problems:   Chronic kidney disease (CKD), stage III (moderate) (HCC)   Diabetes mellitus type 2 with retinopathy (HCC)   CAD (coronary artery disease)   Severe hypertension   Encephalopathy, hypertensive  Acute encephalopathy; Suspected Hypertensive Encephalopathy, improved  -Presented with dysarthria, left-sided weakness and numbness, and right facial droop -SBP was 222 on arrival and symptoms resolved with BP control; Neurology recommending Allowing for permissive HTN for the first 24-48 hours and treat ONLY if SBP>220 mmHg and then recommend BP to be normalized to SBP<140 at D/C  -No acute findings on head CT -MRI showed Punctate Right Occipital Area of Restricted Diffusion that does not correlate with the patient's symptoms and likely is incidental given Uncontrolled BP's and Uncontrolled Sugars -Neurology consulting and much appreciatedand recommending optimization of Stroke Risk Factors -Per Neurology allowed for Permissive HTN but ok to Normalize BP now -Follow up with PCP, Cardiology and Neurology as an outpatient   Acute Ischemic CVA -Presented with dysarthria, left-sided weakness and numbness, and right facial droop -Head CT and Cervical Spine showed Mild chronic ischemic white matter disease. No acute intracranial abnormality seen. Multilevel degenerative disc disease is noted. Status post surgical anterior fusion of C6-7. No acute abnormality seen in the cervical spine. -MRI of Brain showed Possible tiny acute infarct in the right occipital cortex. Given the location this could be an artifact however is seen on axial and coronal diffusion imaging suggesting this may be an acute infarct. Moderate chronic microvascular ischemic change in the white matter. -Neurology Consulted and recommending Continued CVA Workup -Continue  telemetry monitoring -Neurology recommending getting  an MRA of the head without contrast; MRA Head w/o Contrast showed Normal intracranial MRA. -Neurology also recommended carotid Dopplers;  -Echocardiogram showed "The left ventricle has normal systolic function with an ejection fraction of 60-65%. The cavity size was normal. Left ventricular diastolic Doppler parameters are consistent with impaired relaxation. The right ventricle has normal systolic function. The cavity was normal. There is no increase in right ventricular wall thickness. The mitral valve is normal in structure. No evidence of mitral valve stenosis." -Carotid Dopplers preliminary showed Right and Left ICA consistent 1-39% Stenosis and Bilateral Vertebral Arteries demonstrate antegrade flow -Hemoglobin A1c was 9.3; Fasting lipid panel will be checked showed total cholesterol/HDL ratio of 6.0, cholesterol level of 174, HDL 29, LDL of 83, triglycerides 312, and VLDL of 62 -Continue with neurochecks per protocol -Patient was started on Aspirin 325 mg p.o. 300 mg PR and consideration will be made for dual antiplatelet therapy based on vessel imaging but Neurology recommending 81 mg po Daily; -Patient was placed on Atorvastatin 80 mg p.o. daily and changed to 40 mg po Daily for D/C given my discussion with Dr. Jaynee [name]. Therapies have been consulted and PT is recommending outpatient PT  And OT recommending no follow up  -Continue Risk Factor Modification -Stroke Team to evaluate and recommending 30 Day Cardiac Monitor and resumption of 81 mg of ASA; Per my discussion with Dr. Jaynee [name] Unclear if TEE and Loop is necessary but will make deferral to Sandra [name] in the outpatient to see if he wants it  -Follow up with Neurology Sandra [name] in the outpatient setting C/w ASA 81 mg po Daily and Atorvastatin 40 mg po qHS  Mild AKI on CKD stage III-IV, improved -SCr is 1.83, up from 1.50 in October; Repeat this AM was improved and is 27/1.45 -Renally-dose  medications, Avoid Contrast Dyes -Held Lisinopril while hospitalized but ok to resume at D/C  -Repeat CMP in AM   Insulin-Dependent DM -HbA1c was 8.3% in [place] repeat this visit was 9.3 -Managed at home with Glimepiride, Novolin R 24-27 units TID, and Novolin N 40 units qAM and 18 units qHS; Will resume Home Regimen at D/C -Check CBG's and use Levemir with mealtime and correctional Novolog while in hospital -Currently is on 30 units Levemir BID, Moderate Novolog SSI AC/HS and 6 units sq TIDwm -Blood Sugars have been ranging from 154-383 -Diabetes Education Coordinator Consulted for further evaluation and recommendations -Continue adjust Insulin Regimen as Necessary  CAD/Hx of Takotsubo Cardiomyopathy -Currently has No anginal complaints -Continue ASA but will keep at 81 mg po Dialy , Atorvastatin 20 mg increased to 80 mg at the recommendation of Neurology but then change to 40 mg po Daily  -C/w Beta-blocker Metoprolol XL 50 mg po Daily  Hypokalemia -Patient's K+ was 3.4 this AM and improved to 4.5 -Continue to Monitor and Replete as Necessary -Repeat CMP in AM  Normocytic Anemia  -Patient's Hb/Hct went from 13.4/40.8 -> 11.7/33.8 -> 12.0/35.4 -> 12.3/37.6 -Continue to Monitor for S/Sx of Bleeding -Check Anemia Panel as an outpatient  -Repeat CBC as an outpatient    Depression and Anxiety -C/w Clonazepam 0.5 mg po BIDprn Anxiety   GERD/Acid Reflux -C/w Famotidine 20 mg po Daily   HLD -Checked Lipid Panel; Fasting lipid panel will be checked showed total cholesterol/HDL ratio of 6.0, cholesterol level of 174, HDL 29, LDL of 83, triglycerides 312, and VLDL of 62-C/w increased Atorvastatin at 80 mg Daily but after discussion with Dr. Jaynee [name] of Neurology she recommends 40  mg po Atorvastatin daily  -C/w Fenofibrate 160 mg po daily   Obesity -Estimated body mass index is 30.14 kg/m as calculated from the following:   Height as of this encounter: 5' (1.524 m).    Weight as of this encounter: 70 kg. -C/w Ensure Enlive 237 mL po BID between meals  Discharge Instructions  Discharge Instructions    Ambulatory referral to Cardiology   Complete by:  As directed    Needs 30 Day Event Monitor   Ambulatory referral to Physical Therapy   Complete by:  As directed    Call MD for:  difficulty breathing, headache or visual disturbances   Complete by:  As directed    Call MD for:  extreme fatigue   Complete by:  As directed    Call MD for:  hives   Complete by:  As directed    Call MD for:  persistant dizziness or light-headedness   Complete by:  As directed    Call MD for:  persistant nausea and vomiting   Complete by:  As directed    Call MD for:  redness, tenderness, or signs of infection (pain, swelling, redness, odor or green/yellow discharge around incision site)   Complete by:  As directed    Call MD for:  severe uncontrolled pain   Complete by:  As directed    Call MD for:  temperature >100.4   Complete by:  As directed    Diet - low sodium heart healthy   Complete by:  As directed    Diet Carb Modified   Complete by:  As directed    Discharge instructions   Complete by:  As directed    You were cared for by a hospitalist during your hospital stay. If you have any questions about your discharge medications or the care you received while you were in the hospital after you are discharged, you can call the unit and ask to speak with the hospitalist on call if the hospitalist that took care of you is not available. Once you are discharged, your primary care physician will handle any further medical issues. Please note that NO REFILLS for any discharge medications will be authorized once you are discharged, as it is imperative that you return to your primary care physician (or establish a relationship with a primary care physician if you do not have one) for your aftercare needs so that they can reassess your need for medications and monitor your lab  values.  Follow up with PCP, Neurology, and Cardiology as an outpatient for 30 day event monitor. Take all medications as prescribed. If symptoms change or worsen please return to the ED for evaluation   Increase activity slowly   Complete by:  As directed      Allergies as of 03/20/2018      Reactions   Cephalexin Nausea And Vomiting   Pt stated made severely sick, will never take again   Pioglitazone Other (See Comments)   REACTION: EDEMA      Medication List    TAKE these medications    stroke: mapping our early stages of recovery book Misc 1 each by Does not apply route once for 1 dose.   ACCU-CHEK FASTCLIX LANCETS Misc   ACCU-CHEK SMARTVIEW test strip Generic drug:  glucose blood   Albuterol Sulfate 108 (90 Base) MCG/ACT Aepb Commonly known as:  PROAIR RESPICLICK Inhale 1-2 puffs into the lungs every 6 (six) hours as needed. What changed:  reasons to take  this   aspirin 81 MG tablet Take 81 mg by mouth daily.   atorvastatin 40 MG tablet Commonly known as:  LIPITOR Take 1 tablet (40 mg total) by mouth daily at 6 PM. What changed:    medication strength  how much to take  when to take this   clonazePAM 0.5 MG tablet Commonly known as:  KLONOPIN TAKE 1 TABLET TWICE DAILY AS NEEDED FOR ANXIETY What changed:  See the new instructions.   famotidine 40 MG tablet Commonly known as:  PEPCID Take 40 mg by mouth daily.   feeding supplement (ENSURE ENLIVE) Liqd Take 237 mLs by mouth 2 (two) times daily between meals.   fenofibrate 160 MG tablet TAKE 1 TABLET EVERY DAY   Fluticasone-Salmeterol 250-50 MCG/DOSE Aepb Commonly known as:  ADVAIR DISKUS Inhale 1 puff into the lungs 2 (two) times daily.   glimepiride 4 MG tablet Commonly known as:  AMARYL Take 2 mg by mouth 2 (two) times daily.   HYDROcodone-acetaminophen 5-325 MG tablet Commonly known as:  NORCO/VICODIN Take 1 tablet by mouth every 6 (six) hours as needed for moderate pain.   insulin regular  100 units/mL injection Commonly known as:  NOVOLIN R,HUMULIN R 27 Units 3 (three) times daily before meals. 24 units TID before meals   INSULIN SYRINGE .5CC/31GX5/16" 31G X 5/16" 0.5 ML Misc 40 units in morning and 18units at night   lisinopril 10 MG tablet Commonly known as:  PRINIVIL,ZESTRIL Take 10 mg by mouth daily.   metoprolol succinate 50 MG 24 hr tablet Commonly known as:  TOPROL-XL TAKE 1 TABLET EVERY DAY.  TAKE  WITH  OR  IMMEDIATELY FOLLOWING A MEAL What changed:  See the new instructions.   NOVOLIN N RELION 100 UNIT/ML injection Generic drug:  insulin NPH Human Inject 18-40 Units into the skin See admin instructions. 40 units in the morning and 18 units in the evening.   senna-docusate 8.6-50 MG tablet Commonly known as:  Senokot-S Take 1 tablet by mouth at bedtime as needed for mild constipation.   tiZANidine 2 MG tablet Commonly known as:  ZANAFLEX Take 1 tablet (2 mg total) by mouth every 8 (eight) hours as needed for muscle spasms.   VITAMIN B-12 PO Take 1 tablet by mouth daily.      Follow-up Information    [place] [facility].   Specialty:  Rehabilitation Why:  They will contact you for the first appointment Contact information: [address] [place]       Sandra Major, MD. Call.   Specialty:  Family Medicine Why:  Follow up within 1 week  Contact information: [address] [place] [address] [phone]        Garvin Fila, MD. Call.   Specialties:  Neurology, Radiology Why:  Follow up in 1-2 weeks  Contact information: [address] [place] [address] [phone]          Allergies  Allergen Reactions  . Cephalexin Nausea And Vomiting    Pt stated made severely sick, will never take again  . Pioglitazone Other (See Comments)    REACTION: EDEMA    Consultations:  Neurology  Procedures/Studies: Mr Virgel Paling Wo Contrast  Result Date: 03/18/2018 CLINICAL DATA:  Stroke follow-up EXAM: MRA HEAD WITHOUT CONTRAST TECHNIQUE: Angiographic images of the Circle of Willis were obtained using MRA technique without intravenous contrast. COMPARISON:  None. FINDINGS: POSTERIOR CIRCULATION: --Basilar artery:  Normal. --Posterior cerebral arteries: Normal. The right PCA is predominantly supplied by the posterior communicating artery. --Superior cerebellar arteries: Normal. --Inferior cerebellar arteries: Normal anterior and posterior inferior cerebellar arteries. ANTERIOR CIRCULATION: --Intracranial internal carotid arteries: Normal. --Anterior cerebral arteries: Normal. Both A1 segments are present. Patent anterior communicating artery. --Middle cerebral arteries: Normal. --Posterior communicating arteries: Present on the right, absent on the left. IMPRESSION: Normal intracranial MRA. Electronically Signed   By: Ulyses Jarred M.D.   On: 03/18/2018 19:38   Mr Brain Wo Contrast  Result Date: 03/18/2018 CLINICAL DATA:  Focal neuro deficit less than 6 hours. Diabetes hypertension chronic kidney disease. Slurred speech headache. Possible left-sided weakness and right facial droop. EXAM: MRI HEAD WITHOUT CONTRAST TECHNIQUE: Multiplanar, multiecho pulse sequences of the brain and surrounding structures were obtained without intravenous contrast. COMPARISON:  CT head 03/17/2018 FINDINGS: Brain: Possible tiny acute infarct in the right occipital lobe. This is cortically based and could be an artifact however is seen on coronal and axial diffusion. No other acute infarct. Mild to moderate chronic white matter changes. No cortical infarct. Negative for hemorrhage or mass. Ventricle size and cerebral volume normal for age. Vascular: Normal arterial flow voids Skull and upper cervical spine: Negative Sinuses/Orbits: Mild mucosal edema paranasal sinuses. Bilateral cataract  surgery. Other: None IMPRESSION: 1. Possible tiny acute infarct in the right occipital cortex. Given the location this could be an artifact however is seen on axial and coronal diffusion imaging suggesting this may be an acute infarct. 2. Moderate chronic microvascular ischemic change in the white matter. Electronically Signed   By: Franchot Gallo M.D.   On: 03/18/2018 06:53   Ct Head Code Stroke Wo Contrast  Result Date: 03/17/2018 CLINICAL DATA:  Code stroke.  Left facial droop and weakness. EXAM: CT HEAD WITHOUT CONTRAST TECHNIQUE: Contiguous axial images were obtained from the base of the skull through the vertex without intravenous contrast. COMPARISON:  Head CT 11/15/2017 FINDINGS: Brain: There is no mass, hemorrhage or extra-axial collection. The size and configuration of the ventricles and extra-axial CSF spaces are normal. There is hypoattenuation of the periventricular white matter, most commonly indicating chronic ischemic microangiopathy. Unchanged old left basal ganglia lacunar infarcts. Vascular: No abnormal hyperdensity of the [name] intracranial arteries or dural venous sinuses. No intracranial atherosclerosis. Skull: The visualized skull base, calvarium and extracranial soft tissues are normal. Sinuses/Orbits: No fluid levels or advanced mucosal thickening of the visualized paranasal sinuses. No mastoid or middle ear effusion. The orbits are normal. ASPECTS [facility] Stroke Program Early CT Score) - Ganglionic level infarction (caudate, lentiform nuclei, internal capsule, insula, M1-M3 cortex): 7 - Supraganglionic infarction (M4-M6 cortex): 3 Total score (0-10 with 10 being normal): 10 IMPRESSION: 1. No hemorrhage or other acute finding. 2. ASPECTS is 10. These results were communicated to Dr. Kerney Elbe at 9:10 pm on 03/17/2018 by text page via the [facility] messaging system. Electronically Signed   By: Ulyses Jarred M.D.   On: 03/17/2018 21:11   Vas US Carotid  Result Date: 03/18/2018 Carotid  Arterial Duplex Study Indications:       CVA, Speech disturbance and Stroke. Limitations:       Shadowing Comparison Study:  Carotid duplex on 11/28/2010 Performing Technologist: Rudell Cobb  Examination Guidelines: A complete evaluation includes B-mode imaging, spectral Doppler, color Doppler, and power Doppler as needed of all accessible portions of each vessel. Bilateral testing is considered an integral part of a complete examination. Limited examinations for reoccurring indications may be performed as noted.  Right Carotid Findings: +----------+--------+--------+--------+-------------------------------+--------+           PSV cm/sEDV cm/sStenosisDescribe                       Comments +----------+--------+--------+--------+-------------------------------+--------+ CCA Prox  77      15                                                      +----------+--------+--------+--------+-------------------------------+--------+ CCA Distal73      16                                                      +----------+--------+--------+--------+-------------------------------+--------+ ICA Prox  90      22      1-39%   focal, hyperechoic and calcific         +----------+--------+--------+--------+-------------------------------+--------+ ICA Mid   96      24                                                      +----------+--------+--------+--------+-------------------------------+--------+ ICA Distal98      24                                                      +----------+--------+--------+--------+-------------------------------+--------+ ECA       107     9                                                       +----------+--------+--------+--------+-------------------------------+--------+ +----------+--------+-------+--------+-------------------+           PSV cm/sEDV cmsDescribeArm Pressure (mmHG) +----------+--------+-------+--------+-------------------+  [id number]                                         +----------+--------+-------+--------+-------------------+ +---------+--------+--+--------+--+---------+ VertebralPSV cm/s65EDV cm/s17Antegrade +---------+--------+--+--------+--+---------+  Left Carotid Findings: +----------+-------+--------+--------+--------------------------------+--------+           PSV    EDV cm/sStenosisDescribe                        Comments           cm/s                                                            +----------+-------+--------+--------+--------------------------------+--------+ CCA Prox  109    20  tortuous +----------+-------+--------+--------+--------------------------------+--------+ CCA Distal60     12              diffuse and hyperechoic                  +----------+-------+--------+--------+--------------------------------+--------+ ICA Prox  93     21      1-39%   diffuse, heterogenous and                                                 calcific                                 +----------+-------+--------+--------+--------------------------------+--------+ ICA Mid   103    27                                                       +----------+-------+--------+--------+--------------------------------+--------+ ICA Distal115    22                                                       +----------+-------+--------+--------+--------------------------------+--------+ ECA       91     9                                                        +----------+-------+--------+--------+--------------------------------+--------+ +----------+--------+--------+--------+-------------------+ SubclavianPSV cm/sEDV cm/sDescribeArm Pressure (mmHG) +----------+--------+--------+--------+-------------------+           199                                          +----------+--------+--------+--------+-------------------+ +---------+--------+--+--------+--+---------+ VertebralPSV cm/s52EDV cm/s12Antegrade +---------+--------+--+--------+--+---------+  Summary: Right Carotid: Velocities in the right ICA are consistent with a 1-39% stenosis. Left Carotid: Velocities in the left ICA are consistent with a 1-39% stenosis. Vertebrals: Bilateral vertebral arteries demonstrate antegrade flow. *See table(s) above for measurements and observations.     Preliminary    ECHOCARDIOGRAM IMPRESSIONS    1. The left ventricle has normal systolic function with an ejection fraction of 60-65%. The cavity size was normal. Left ventricular diastolic Doppler parameters are consistent with impaired relaxation.  2. The right ventricle has normal systolic function. The cavity was normal. There is no increase in right ventricular wall thickness.  3. The mitral valve is normal in structure. No evidence of mitral valve stenosis.  4. The tricuspid valve is normal in structure.  5. The aortic valve is tricuspid Mild thickening of the aortic valve no stenosis of the aortic valve.  6. The aortic root is normal in size and structure.  FINDINGS  Left Ventricle: The left ventricle has normal systolic function, with an ejection fraction of 60-65%. The cavity size was normal. There is no increase in left ventricular wall thickness. Left ventricular diastolic Doppler  parameters are consistent with  impaired relaxation Normal left ventricular filling pressures Right Ventricle: The right ventricle has normal systolic function. The cavity was normal. There is no increase in right ventricular wall thickness. Left Atrium: left atrial size was normal in size Right Atrium: right atrial size was normal in size. Right atrial pressure is estimated at 3 mmHg. Interatrial Septum: No atrial level shunt detected by color flow Doppler. Pericardium: There is no evidence of pericardial  effusion. Mitral Valve: The mitral valve is normal in structure. Mitral valve regurgitation is not visualized by color flow Doppler. No evidence of mitral valve stenosis. Tricuspid Valve: The tricuspid valve is normal in structure. Tricuspid valve regurgitation was not visualized by color flow Doppler. Aortic Valve: The aortic valve is tricuspid Mild thickening of the aortic valve. Aortic valve regurgitation was not visualized by color flow Doppler. There is no stenosis of the aortic valve. Pulmonic Valve: The pulmonic valve was not assessed. Pulmonic valve regurgitation is not visualized by color flow Doppler. No evidence of pulmonic stenosis. Aorta: The aortic root is normal in size and structure.   LEFT VENTRICLE PLAX 2D (Teich) LV EF:          62.1 %   Diastology LVIDd:          4.50 cm  LV e' lateral:   6.53 cm/s LVIDs:          3.00 cm  LV E/e' lateral: 12.8 LV PW:          1.00 cm  LV e' medial:    6.20 cm/s LV IVS:         0.80 cm  LV E/e' medial:  13.5 LVOT diam:      1.80 cm LV SV:          57 ml LVOT Area:      2.54 cm  RIGHT VENTRICLE TAPSE (M-mode): 1.6 cm RVSP:           3.1 mmHg  LEFT ATRIUM           Index       RIGHT ATRIUM           Index LA diam:      3.60 cm 2.15 cm/m  RA Pressure: 3 mmHg LA Vol (A2C): 31.7 ml 18.96 ml/m RA Area:     10.20 cm                                   RA Volume:   21.40 ml  12.80 ml/m  AORTIC VALVE LVOT Vmax:   109.00 cm/s LVOT Vmean:  74.200 cm/s LVOT VTI:    0.261 m   AORTA Ao Root diam: 2.80 cm  MITRAL VALVE               TRICUSPID VALVE MV Area (PHT): 2.91 cm    TR Peak grad:   0.1 mmHg MV PHT:        75.69 msec  TR Vmax:        12.40 cm/s MV Decel Time: 261 msec    RVSP:           3.1 mmHg MV E velocity: 83.90 cm/s MV A velocity: 117.00 cm/s MV E/A ratio:  0.72  Subjective: Seen and examined at bedside and is doing better.  Denies any chest pain, lightheadedness or dizziness.  Symptoms are completely resolved.  No  other concerns or complaints at  this time and is ready to go home.  Discharge Exam: Vitals:   03/20/18 0900 03/20/18 1107  BP:  (!) 182/55  Pulse:  70  Resp:  (!) 23  Temp:  98.1 F (36.7 C)  SpO2: 100% 99%   Vitals:   03/20/18 0014 03/20/18 0400 03/20/18 0900 03/20/18 1107  BP: (!) 145/75 (!) 158/51  (!) 182/55  Pulse: 73 70  70  Resp: 19 17  (!) 23  Temp: 98.2 F (36.8 C) 98 F (36.7 C)  98.1 F (36.7 C)  TempSrc: Oral Oral  Axillary  SpO2: 99% 100% 100% 99%  Weight:      Height:       General: Pt is alert, awake, not in acute distress Cardiovascular: RRR, S1/S2 +, no rubs, no gallops Respiratory: Diminished bilaterally, no wheezing, no rhonchi Abdominal: Soft, NT, Distended due to body habitus, bowel sounds + Extremities: Trace edema, no cyanosis  The results of significant diagnostics from this hospitalization (including imaging, microbiology, ancillary and laboratory) are listed below for reference.    Microbiology: Recent Results (from the past 240 hour(s))  Culture, blood (routine x 2)     Status: None (Preliminary result)   Collection Time: 03/18/18  3:24 AM  Result Value Ref Range Status   Specimen Description BLOOD RIGHT HAND  Final   Special Requests   Final    BOTTLES DRAWN AEROBIC AND ANAEROBIC Blood Culture adequate volume   Culture   Final    NO GROWTH 2 DAYS Performed at [facility], [address] [place], [place] [address]    Report Status PENDING  Incomplete  Culture, blood (routine x 2)     Status: None (Preliminary result)   Collection Time: 03/18/18  3:34 AM  Result Value Ref Range Status   Specimen Description BLOOD LEFT HAND  Final   Special Requests   Final    BOTTLES DRAWN AEROBIC ONLY Blood Culture results may not be optimal due to an inadequate volume of blood received in culture bottles   Culture   Final    NO GROWTH 2 DAYS Performed at [facility], [place] [address] [place], [place] [address]    Report Status PENDING   Incomplete    Labs: BNP (last 3 results) No results for input(s): BNP in the last 8760 hours. Basic Metabolic Panel: Recent Labs  Lab 03/17/18 2107 03/17/18 2117 03/18/18 0325 03/19/18 0521 03/20/18 0448  NA 137  --  135 138 137  K 4.3  --  3.4* 3.9 4.5  CL 103  --  104 110 108  CO2 22  --  21* 21* 22  GLUCOSE 365*  --  263* 126* 205*  BUN 34*  --  29* 21 27*  CREATININE 1.83* 1.90* 1.58* 1.33* 1.45*  CALCIUM 9.3  --  8.7* 8.9 9.1  MG  --   --   --  1.7 1.9  PHOS  --   --   --  3.1  --    Liver Function Tests: Recent Labs  Lab 03/17/18 2107 03/19/18 0521 03/20/18 0448  AST 22 20 23   ALT 18 17 16   ALKPHOS 62 58 58  BILITOT 0.8 0.7 0.7  PROT 7.2 6.7 6.5  ALBUMIN 4.0 3.6 3.4*   No results for input(s): LIPASE, AMYLASE in the last 168 hours. No results for input(s): AMMONIA in the last 168 hours. CBC: Recent Labs  Lab 03/17/18 2107 03/18/18 0325 03/19/18 0521 03/20/18 0448  WBC  6.6 7.4 5.7 5.3  NEUTROABS 3.1  --  2.4 2.2  HGB 13.4 11.7* 12.0 12.3  HCT 40.8 33.8* 35.4* 37.6  MCV 99.0 96.8 97.0 99.7  PLT 251 236 241 242   Cardiac Enzymes: No results for input(s): CKTOTAL, CKMB, CKMBINDEX, TROPONINI in the last 168 hours. BNP: Invalid input(s): POCBNP CBG: Recent Labs  Lab 03/19/18 1202 03/19/18 1633 03/19/18 2104 03/20/18 0948 03/20/18 1220  GLUCAP 310* 154* 234* 222* 383*   D-Dimer No results for input(s): DDIMER in the last 72 hours. Hgb A1c Recent Labs    03/18/18 0325 03/19/18 0521  HGBA1C 9.3* 9.4*   Lipid Profile Recent Labs    03/19/18 0521  CHOL 174  HDL 29*  LDLCALC 83  TRIG 312*  CHOLHDL 6.0   Thyroid function studies No results for input(s): TSH, T4TOTAL, T3FREE, THYROIDAB in the last 72 hours.  Invalid input(s): FREET3 Anemia work up No results for input(s): VITAMINB12, FOLATE, FERRITIN, TIBC, IRON, RETICCTPCT in the last 72 hours. Urinalysis    Component Value Date/Time   COLORURINE YELLOW 12/14/2016 1058    APPEARANCEUR CLEAR 12/14/2016 1058   LABSPEC 1.015 12/14/2016 1058   PHURINE 6.0 12/14/2016 1058   GLUCOSEU >=1000 (A) 12/14/2016 1058   HGBUR NEGATIVE 12/14/2016 1058   HGBUR moderate 10/31/2009 0958   BILIRUBINUR NEGATIVE 12/14/2016 1058   BILIRUBINUR negative 10/13/2016 1059   KETONESUR NEGATIVE 12/14/2016 1058   PROTEINUR negative 10/13/2016 1059   PROTEINUR NEGATIVE 05/23/2015 1504   UROBILINOGEN 0.2 12/14/2016 1058   NITRITE NEGATIVE 12/14/2016 1058   LEUKOCYTESUR TRACE (A) 12/14/2016 1058   Sepsis Labs Invalid input(s): PROCALCITONIN,  WBC,  LACTICIDVEN Microbiology Recent Results (from the past 240 hour(s))  Culture, blood (routine x 2)     Status: None (Preliminary result)   Collection Time: 03/18/18  3:24 AM  Result Value Ref Range Status   Specimen Description BLOOD RIGHT HAND  Final   Special Requests   Final    BOTTLES DRAWN AEROBIC AND ANAEROBIC Blood Culture adequate volume   Culture   Final    NO GROWTH 2 DAYS Performed at [facility], [place] [address], [place], [place] [address]    Report Status PENDING  Incomplete  Culture, blood (routine x 2)     Status: None (Preliminary result)   Collection Time: 03/18/18  3:34 AM  Result Value Ref Range Status   Specimen Description BLOOD LEFT HAND  Final   Special Requests   Final    BOTTLES DRAWN AEROBIC ONLY Blood Culture results may not be optimal due to an inadequate volume of blood received in culture bottles   Culture   Final    NO GROWTH 2 DAYS Performed at [facility], [place] [address], [place], [place] [address]    Report Status PENDING  Incomplete   Time coordinating discharge: 35 minutes  SIGNED:  Kerney Elbe, DO [facility] 03/20/2018, 3:24 PM Pager is on [place]  If 7PM-7AM, please contact night-coverage [email or web address] Password [email or web address]

## 2018-03-20 NOTE — Progress Notes (Signed)
STROKE TEAM PROGRESS NOTE   HISTORY OF PRESENT ILLNESS (per record) Sandra [name] is an 85 y.o. female who presented via EMS with acute onset of intermittently slurred speech and left sided weakness/numbness, as well as some right sided facial droop. Sandra [name] 1913, which was the same as TOSO. Symptoms resolved at about 1925, then recurred at 1935 en route. Code Stroke was called in the field. BP per EMS was 214/101. On arrival the patient was still symptomatic, with BP of 222/73 and anxious, tearful affect. She complained of a right supraorbital 8/10 nonthrobbing headache in CT. CT head was negative for acute abnormality. Her symptoms resolved after management of her BP with clevidipine drip. The patient's CBG was elevated in the 400's. She states that she had not taken her medications over the past 2 days due to the stress of taking care of her ill sister. Missed medications included her antihypertensives and insulin. She takes ASA on an irregular basis.   She states that she had a TIA in 2012 but cannot recall what the deficits were.   Her PMHx includes multiple stroke risk factors as well as SAH.   LSN: [id number] tPA Given: No: Overall clinical picture most consistent with hypertensive urgency. Symptoms resolved with BP management.    SUBJECTIVE (INTERVAL HISTORY) Her family is at bedside. She feels well and wants to go home. She had not taken her medications for 3 days prior to hospitalization, stressed out and taking care of sister. She won't stay until next week. Discussed stroke, looks cortical vs artifact so outpatient she can have f/u with neurology to decide whether TEE/Loop is necessary but will order 30-day heart monitor.    OBJECTIVE Vitals:   03/20/18 0000 03/20/18 0014 03/20/18 0400 03/20/18 0900  BP:  (!) 145/75 (!) 158/51   Pulse:  73 70   Resp: 20 19 17    Temp:  98.2 F (36.8 C) 98 F (36.7 C)   TempSrc:  Oral Oral   SpO2:  99% 100% 100%  Weight:      Height:        CBC:   Recent Labs  Lab 03/19/18 0521 03/20/18 0448  WBC 5.7 5.3  NEUTROABS 2.4 2.2  HGB 12.0 12.3  HCT 35.4* 37.6  MCV 97.0 99.7  PLT 241 275    Basic Metabolic Panel:  Recent Labs  Lab 03/19/18 0521 03/20/18 0448  NA 138 137  K 3.9 4.5  CL 110 108  CO2 21* 22  GLUCOSE 126* 205*  BUN 21 27*  CREATININE 1.33* 1.45*  CALCIUM 8.9 9.1  MG 1.7 1.9  PHOS 3.1  --     Lipid Panel:     Component Value Date/Time   CHOL 174 03/19/2018 0521   TRIG 312 (H) 03/19/2018 0521   HDL 29 (L) 03/19/2018 0521   CHOLHDL 6.0 03/19/2018 0521   VLDL 62 (H) 03/19/2018 0521   LDLCALC 83 03/19/2018 0521   HgbA1c:  Lab Results  Component Value Date   HGBA1C 9.4 (H) 03/19/2018   Urine Drug Screen:     Component Value Date/Time   LABOPIA NONE DETECTED 01/11/2016 1236   COCAINSCRNUR NONE DETECTED 01/11/2016 1236   LABBENZ NONE DETECTED 01/11/2016 1236   AMPHETMU NONE DETECTED 01/11/2016 1236   THCU NONE DETECTED 01/11/2016 1236   LABBARB NONE DETECTED 01/11/2016 1236    Alcohol Level     Component Value Date/Time   [facility] <5 01/11/2016 0955    IMAGING  Mr Sandra [name] Head Wo Contrast  03/18/2018 IMPRESSION:  Normal intracranial MRA.    Mr Brain Wo Contrast 03/18/2018 IMPRESSION:  1. Possible tiny acute infarct in the right occipital cortex. Given the location this could be an artifact however is seen on axial and coronal diffusion imaging suggesting this may be an acute infarct.  2. Moderate chronic microvascular ischemic change in the white matter.    Ct Head Code Stroke Wo Contrast 03/17/2018 IMPRESSION:  1. No hemorrhage or other acute finding.  2. ASPECTS is 10.    Vas US Carotid 03/18/2018 Summary:  Right Carotid: Velocities in the right ICA are consistent with a 1-39% stenosis.  Left Carotid: Velocities in the left ICA - are consistent with a 1-39% stenosis.  Vertebrals: Bilateral vertebral arteries demonstrate antegrade flow.    Transthoracic Echocardiogram   00/00/2020 Pending  Exam: NAD, pleasant                  Speech:    Speech is normal; fluent and spontaneous with normal comprehension.  Cognition:    The patient is oriented to person, place, and time;     recent and remote memory intact;     language fluent;    Cranial Nerves:    The pupils are equal, round, and reactive to light.Trigeminal sensation is intact and the muscles of mastication are normal. The face is symmetric. The palate elevates in the midline. Hearing intact. Voice is normal. Shoulder shrug is normal. The tongue has normal motion without fasciculations.   Coordination:  No dysmetria  Motor Observation:    No asymmetry, no atrophy, and no involuntary movements noted. Tone:    Normal muscle tone.     Strength:    Strength is V/V in the upper and lower limbs.      Sensation: intact to LT   PHYSICAL EXAM Blood pressure (!) 158/51, pulse 70, temperature 98 F (36.7 C), temperature source Oral, resp. rate 17, height 5' (1.524 m), weight 70 kg, SpO2 100 %.       ASSESSMENT/PLAN [name] is a 85 y.o. female with history of TIA, Takotsubo cardiomyopathy, SAH hx possibly RCVS, Htn, Hld, DM, CAD with Mi, anxiety and depression presenting with intermittently slurred speech and left sided weakness/numbness, as well as some right sided facial droop, HA and BP 214/101. She did not receive IV t-PA due to resolution of deficits.  Stroke:  Possible tiny acute infarct in the right occipital cortex vs artifact. Very cortical. Unclear if TEE and loop  Necessary patient had stopped all her medications 3 days prior to event which did not correlate with a stroke in that area, she wants to go home so will set her up and neurology outpatient. She has seen Dr. Leonie [name] in the past will follow up with him. In the meantime recommend 30-day heart monitor outpatient.   Resultant  No deficits  CT head  - negative  MRI head - Possible tiny acute infarct in the right occipital  cortex vs artifact.  MRA head - normal  CTA H&N - not performed  Carotid Doppler  - unremarkable  2D Echo - pending  LDL - 83  HgbA1c - 9.4  UDS - not performed  VTE prophylaxis - [place] Heparin  Diet - Heart healthy / carb modified with thin liquids.  aspirin 81 mg daily prior to admission, now on aspirin 325 mg daily  Patient counseled to be compliant with her antithrombotic medications. She had missed here ASA 81mg , recommend continuation of ASA 81mg  daily  at home  Will need 30-day cardiac monitor outpatient for evaluation of afib/aflutter  Ongoing aggressive stroke risk factor management  Therapy recommendations:  Outpt PT recommended  Disposition:  Pending  Hypertension  Stable . Permissive hypertension (OK if < 220/120) but gradually normalize in 5-7 days . Long-term BP goal normotensive  Hyperlipidemia  Lipid lowering medication PTA: Lipitor 20 mg daily and fenofibrate 160 MG daily  LDL 83, goal < 70  Current lipid lowering medication: Lipitor 80 mg daily and fenofibrate 160 MG daily  Continue statin at discharge  Diabetes  HgbA1c 9.4, goal < 7.0  Uncontrolled  Other Stroke Risk Factors  Advanced age  Obesity, Body mass index is 30.14 kg/m., recommend weight loss, diet and exercise as appropriate   Hx stroke/TIA  Daughter - [facility]  Coronary artery disease   Other Active Problems  Non compliance with medications  - ? Financial concerns ?  Creatinine - 1.33  Plan: - Occipital very small stroke vs artifact. Does not correlate with symptoms.   Possible tiny acute infarct in the right occipital cortex vs artifact. Unclear if TEE and loop necessary patient had stopped all her medications 3 days prior to event which did not correlate with a stroke in that area, she wants to go home so will set her up and neurology outpatient. She has seen Dr. Leonie [name] in the past recommend follow up with him in 8-12 weeks. In the meantime recommend 30-day heart  monitor outpatient. If echo unremarkable, stroke will sign off.  Discussed with family and patient.  Hospital day # 2  Personally examined patient and images, and have participated in and made any corrections needed to history, physical, neuro exam,assessment and plan as stated above.  I have personally obtained the history, evaluated lab date, reviewed imaging studies and agree with radiology interpretations.    Sarina Ill, MD Stroke Neurology   A total of 25 minutes was spent for the care of this patient, spent on counseling patient and family on different diagnostic and therapeutic options, counseling and coordination of care, riskd ans benefits of management, compliance, or risk factor reduction and education.   To contact Stroke Continuity provider, please refer to [email or web address] After hours, contact General Neurology

## 2018-03-20 NOTE — Progress Notes (Signed)
Nsg Discharge Note  Admit Date:  03/17/2018 Discharge date: 03/20/2018   Sandra [name] to be D/C'd home per MD order.  AVS completed.   Patient/caregiver able to verbalize understanding.  Discharge Medication: Allergies as of 03/20/2018      Reactions   Cephalexin Nausea And Vomiting   Pt stated made severely sick, will never take again   Pioglitazone Other (See Comments)   REACTION: EDEMA      Medication List    TAKE these medications    stroke: mapping our early stages of recovery book Misc 1 each by Does not apply route once for 1 dose.   ACCU-CHEK FASTCLIX LANCETS Misc   ACCU-CHEK SMARTVIEW test strip Generic drug:  glucose blood   Albuterol Sulfate 108 (90 Base) MCG/ACT Aepb Commonly known as:  PROAIR RESPICLICK Inhale 1-2 puffs into the lungs every 6 (six) hours as needed. What changed:  reasons to take this   aspirin 81 MG tablet Take 81 mg by mouth daily.   atorvastatin 40 MG tablet Commonly known as:  LIPITOR Take 1 tablet (40 mg total) by mouth daily at 6 PM. What changed:    medication strength  how much to take  when to take this   clonazePAM 0.5 MG tablet Commonly known as:  KLONOPIN TAKE 1 TABLET TWICE DAILY AS NEEDED FOR ANXIETY What changed:  See the new instructions.   famotidine 40 MG tablet Commonly known as:  PEPCID Take 40 mg by mouth daily.   feeding supplement (ENSURE ENLIVE) Liqd Take 237 mLs by mouth 2 (two) times daily between meals.   fenofibrate 160 MG tablet TAKE 1 TABLET EVERY DAY   Fluticasone-Salmeterol 250-50 MCG/DOSE Aepb Commonly known as:  ADVAIR DISKUS Inhale 1 puff into the lungs 2 (two) times daily.   glimepiride 4 MG tablet Commonly known as:  AMARYL Take 2 mg by mouth 2 (two) times daily.   HYDROcodone-acetaminophen 5-325 MG tablet Commonly known as:  NORCO/VICODIN Take 1 tablet by mouth every 6 (six) hours as needed for moderate pain.   insulin regular 100 units/mL injection Commonly known as:  NOVOLIN  R,HUMULIN R 27 Units 3 (three) times daily before meals. 24 units TID before meals   INSULIN SYRINGE .5CC/31GX5/16" 31G X 5/16" 0.5 ML Misc 40 units in morning and 18units at night   lisinopril 10 MG tablet Commonly known as:  PRINIVIL,ZESTRIL Take 10 mg by mouth daily.   metoprolol succinate 50 MG 24 hr tablet Commonly known as:  TOPROL-XL TAKE 1 TABLET EVERY DAY.  TAKE  WITH  OR  IMMEDIATELY FOLLOWING A MEAL What changed:  See the new instructions.   NOVOLIN N RELION 100 UNIT/ML injection Generic drug:  insulin NPH Human Inject 18-40 Units into the skin See admin instructions. 40 units in the morning and 18 units in the evening.   senna-docusate 8.6-50 MG tablet Commonly known as:  Senokot-S Take 1 tablet by mouth at bedtime as needed for mild constipation.   tiZANidine 2 MG tablet Commonly known as:  ZANAFLEX Take 1 tablet (2 mg total) by mouth every 8 (eight) hours as needed for muscle spasms.   VITAMIN B-12 PO Take 1 tablet by mouth daily.       Discharge Assessment: Vitals:   03/20/18 1107 03/20/18 1551  BP: (!) 182/55 (!) 154/73  Pulse: 70   Resp: (!) 23 (!) 24  Temp: 98.1 F (36.7 C)   SpO2: 99%    Skin clean, dry and intact without evidence of  skin break down, no evidence of skin tears noted. IV catheter discontinued intact. Site without signs and symptoms of complications - no redness or edema noted at insertion site, patient denies c/o pain - only slight tenderness at site.  Dressing with slight pressure applied.  D/c Instructions-Education: Discharge instructions given to patient/family with verbalized understanding. D/c education completed with patient/family including follow up instructions, medication list, d/c activities limitations if indicated, with other d/c instructions as indicated by MD - patient able to verbalize understanding, all questions fully answered. Patient instructed to return to ED, call 911, or call MD for any changes in condition.   Patient escorted via [place], and D/C home via private auto.  Honor Loh RN BSN PCCN 03/20/2018 4:47 PM

## 2018-03-21 ENCOUNTER — Other Ambulatory Visit: Payer: Self-pay | Admitting: Cardiology

## 2018-03-21 DIAGNOSIS — I639 Cerebral infarction, unspecified: Secondary | ICD-10-CM

## 2018-03-23 LAB — CULTURE, BLOOD (ROUTINE X 2)
CULTURE: NO GROWTH
Culture: NO GROWTH
Special Requests: ADEQUATE

## 2018-04-05 ENCOUNTER — Inpatient Hospital Stay: Admission: RE | Admit: 2018-04-05 | Payer: Medicare HMO | Source: Ambulatory Visit

## 2018-04-19 ENCOUNTER — Telehealth: Payer: Self-pay

## 2018-04-19 NOTE — Telephone Encounter (Signed)
I called pts listed number about her appt [email or web address] explained where I was calling and to discuss her visit for tomorrow.  I explained to pt due to the current COVID 19 pandemic our office is severely reducing office visits for the next two weeks, in order to minimize the risk to our patients and providers we are offer video to telephone visit. PT stated she not want a telephone visit but needs to be seen. She stated " im tired of everybody cancelling my appointments and my neck is hurting. I explain to the pt that have not cancel any previous appts. I explain that we are doing telephone visit.I also tried to tell her the reason for not doing the office visit due to COVID 19. Pt stated " I have not been seen by anybody since being discharge from the hospital, Im so tired of this, just cancel my appt". I explain to pt we can reschedule her in a month or two if she wants to face to face. I also offer again a tele phone visit with Janett Billow NP tomorrow at scheudle time.Pt decline and kept saying " just cancel my appt I will find somewhere else to go, yall protect yourself. Pt than hung up phone. Message sent to [facility] NP.

## 2018-04-19 NOTE — Telephone Encounter (Signed)
I called pts daughter to inquire about her moms visit tomorrow.  [name] pts daughter stated ' why are you calling me, call my mom. [name] requested I call her mom on her phone. I explain to pts daughter that we are doing web video visit or tele visit due to coronavirus for safety. I ask pts daughter about if her mom had any lap top, computer or smart phone. The daughter stated her mom did not have any of the above.I stated her mom will be contacted.

## 2018-04-19 NOTE — Telephone Encounter (Signed)
Noted! Thank you

## 2018-04-20 ENCOUNTER — Inpatient Hospital Stay: Payer: Medicare HMO | Admitting: Adult Health

## 2018-04-27 ENCOUNTER — Encounter: Payer: Self-pay | Admitting: *Deleted

## 2018-04-27 NOTE — Progress Notes (Signed)
Patient ID: Sandra [name], female   DOB: [birth year], 85 y.o.   MRN: [id number] Patient enrolled for [employer] to ship a 30 day cardiac event monitor to her home.

## 2018-05-04 ENCOUNTER — Encounter: Payer: Self-pay | Admitting: Nephrology

## 2018-05-05 ENCOUNTER — Telehealth: Payer: Self-pay | Admitting: Pulmonary Disease

## 2018-05-05 ENCOUNTER — Telehealth: Payer: Self-pay | Admitting: *Deleted

## 2018-05-05 NOTE — Telephone Encounter (Signed)
Staff message received from [place] with [facility]: Osvaldo Shipper, NT  P [facility] Triage Pool        I called patient about CT appointment she is refusing to come for appointment or reschedule an appt for anything until she is scheduled with an appt with a physician for a FU. She stated she does not have a smart phone or a computer and will not do a virtual visit or let the dr call her with results for the CT. Patient is upset that all her appointments have been canceled.   Stacey    Pt was scheduled for CT 05/17/2018 and also was originally scheduled for an OV with PFT with  Dr. Vaughan Browner in [facility] clinic per Dr. Valeta Harms 06/06/2018 but the appt was cancelled due to [place].  Called and spoke with pt in regards to her CT that was cancelled as well as her appt with Dr. Vaughan Browner that was cancelled. I stated to her that we could always check with NP to see if they would allow her to come in to the office for an appt and pt stated she only wanted to see the MD. I stated to her that Dr. Vaughan Browner was currently at the hospital and would possibly be July before he would be back in the office due to [place] and stated to her as soon as his schedule opened up we would call her to get her scheduled for the OV with him. Pt expressed understanding and stated she wanted to wait until then as well to have the CT performed due to not wanting to have to wait that long before she was able to find out the results of the CT. A reminder is in pt's chart to get her scheduled for an appt with Dr. Vaughan Browner as soon as his schedule opens up.  Nothing further needed.

## 2018-05-05 NOTE — Telephone Encounter (Signed)
Called patient about CT appointment patient states "she is not coming for a CT scan or any other testing until she has an appointment with the physician to follow up" Will call [facility] office and make them aware.

## 2018-05-10 ENCOUNTER — Telehealth: Payer: Self-pay

## 2018-05-10 ENCOUNTER — Ambulatory Visit: Payer: Medicare HMO | Admitting: Pulmonary Disease

## 2018-05-10 NOTE — Telephone Encounter (Signed)
Spoke with pt about transitioning upcoming appt to a video/phone call visit. Pt insisted that she had never met Dr. Radford Pax and that she had a stroke. Pt stated that she wanted to cancel this appt and she would make other arrangements and hung up the phone.

## 2018-05-12 ENCOUNTER — Encounter: Payer: Self-pay | Admitting: *Deleted

## 2018-05-12 ENCOUNTER — Ambulatory Visit: Payer: Medicare HMO | Admitting: Cardiology

## 2018-05-12 NOTE — Progress Notes (Signed)
Patient ID: Sandra [name], female   DOB: 01-09-1934, 85 y.o.   MRN: [id number]  Received notiiaction from [place] 05/03/18.  Patient has requested a cancellation of our monitoring service, ( patient enrolled to have monitor shipped to home 04/27/18).  [employer] will begin the monitor recovery process.

## 2018-05-12 NOTE — Progress Notes (Signed)
I am not sure why this was sent to me, re-routing to Dr. Leonie Man, this is his patient thanks

## 2018-05-12 NOTE — Progress Notes (Signed)
Ok if he does not want to do it

## 2018-05-12 NOTE — Progress Notes (Signed)
Please find out why patient canceled heart monitor

## 2018-05-13 NOTE — Progress Notes (Signed)
This is from Dr. Leonie Man, this is not my patent thanks

## 2018-05-13 NOTE — Progress Notes (Signed)
Please find out why patient cancelled heart monitor

## 2018-05-17 ENCOUNTER — Inpatient Hospital Stay: Admission: RE | Admit: 2018-05-17 | Payer: Medicare HMO | Source: Ambulatory Visit

## 2018-05-26 NOTE — Progress Notes (Signed)
The patient stated the monitor was too difficult to set up and she so she sent it back. She refused to have any other monitor to be sent at this time.

## 2018-06-06 ENCOUNTER — Ambulatory Visit: Payer: Medicare HMO | Admitting: Pulmonary Disease

## 2018-09-30 ENCOUNTER — Telehealth: Payer: Self-pay | Admitting: Pulmonary Disease

## 2018-09-30 NOTE — Telephone Encounter (Signed)
Called and scheduled pt for pft on 10/20/2018 - pt needing to r/s CT scan -pr

## 2018-10-03 NOTE — Telephone Encounter (Signed)
I spoke to [place] at [facility] and she is going to call pt to reschedule.  Nothing further needed.

## 2018-10-17 ENCOUNTER — Other Ambulatory Visit (HOSPITAL_COMMUNITY)
Admission: RE | Admit: 2018-10-17 | Discharge: 2018-10-17 | Disposition: A | Discharge status: Home / Self Care | Payer: Medicare HMO | Source: Ambulatory Visit | Attending: Pulmonary Disease | Admitting: Pulmonary Disease

## 2018-10-17 DIAGNOSIS — Z20828 Contact with and (suspected) exposure to other viral communicable diseases: Secondary | ICD-10-CM | POA: Diagnosis not present

## 2018-10-17 DIAGNOSIS — Z01812 Encounter for preprocedural laboratory examination: Secondary | ICD-10-CM | POA: Insufficient documentation

## 2018-10-18 LAB — NOVEL CORONAVIRUS, NAA (HOSP ORDER, SEND-OUT TO REF LAB; TAT 18-24 HRS): SARS-CoV-2, NAA: NOT DETECTED

## 2018-10-19 ENCOUNTER — Ambulatory Visit (INDEPENDENT_AMBULATORY_CARE_PROVIDER_SITE_OTHER)
Admission: RE | Admit: 2018-10-19 | Discharge: 2018-10-19 | Disposition: A | Discharge status: Home / Self Care | Payer: Medicare HMO | Source: Ambulatory Visit | Attending: Pulmonary Disease | Admitting: Pulmonary Disease

## 2018-10-19 ENCOUNTER — Other Ambulatory Visit: Payer: Self-pay

## 2018-10-19 DIAGNOSIS — J849 Interstitial pulmonary disease, unspecified: Secondary | ICD-10-CM

## 2018-10-20 ENCOUNTER — Ambulatory Visit: Payer: Medicare HMO | Admitting: Pulmonary Disease

## 2018-10-20 ENCOUNTER — Encounter: Payer: Self-pay | Admitting: Pulmonary Disease

## 2018-10-20 ENCOUNTER — Ambulatory Visit (INDEPENDENT_AMBULATORY_CARE_PROVIDER_SITE_OTHER): Payer: Medicare HMO | Admitting: Pulmonary Disease

## 2018-10-20 VITALS — BP 114/68 | HR 79 | Temp 97.8°F | Ht 60.0 in | Wt 153.0 lb

## 2018-10-20 DIAGNOSIS — J849 Interstitial pulmonary disease, unspecified: Secondary | ICD-10-CM | POA: Diagnosis not present

## 2018-10-20 DIAGNOSIS — Z23 Encounter for immunization: Secondary | ICD-10-CM | POA: Diagnosis not present

## 2018-10-20 LAB — PULMONARY FUNCTION TEST
DL/VA % pred: 104 %
DL/VA: 4.39 ml/min/mmHg/L
DLCO unc % pred: 66 %
DLCO unc: 10.78 ml/min/mmHg
FEF 25-75 Post: 2.02 L/sec
FEF 25-75 Pre: 2.25 L/sec
FEF2575-%Change-Post: -10 %
FEF2575-%Pred-Post: 186 %
FEF2575-%Pred-Pre: 208 %
FEV1-%Change-Post: -3 %
FEV1-%Pred-Post: 87 %
FEV1-%Pred-Pre: 91 %
FEV1-Post: 1.32 L
FEV1-Pre: 1.37 L
FEV1FVC-%Change-Post: 1 %
FEV1FVC-%Pred-Pre: 123 %
FEV6-%Change-Post: -4 %
FEV6-%Pred-Post: 75 %
FEV6-%Pred-Pre: 78 %
FEV6-Post: 1.44 L
FEV6-Pre: 1.51 L
FEV6FVC-%Pred-Post: 106 %
FEV6FVC-%Pred-Pre: 106 %
FVC-%Change-Post: -4 %
FVC-%Pred-Post: 70 %
FVC-%Pred-Pre: 73 %
FVC-Post: 1.44 L
FVC-Pre: 1.51 L
Post FEV1/FVC ratio: 92 %
Post FEV6/FVC ratio: 100 %
Pre FEV1/FVC ratio: 91 %
Pre FEV6/FVC Ratio: 100 %
RV % pred: 72 %
RV: 1.62 L
TLC % pred: 70 %
TLC: 3.13 L
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
## 2018-10-20 NOTE — Patient Instructions (Signed)
We will start you on a medication called Ofev

## 2018-10-20 NOTE — Addendum Note (Signed)
Addended by: Collier Salina on: 10/20/2018 03:14 PM   Modules accepted: Orders

## 2018-10-20 NOTE — Progress Notes (Signed)
Full PFT performed today. °

## 2018-10-24 ENCOUNTER — Other Ambulatory Visit (INDEPENDENT_AMBULATORY_CARE_PROVIDER_SITE_OTHER): Payer: Medicare HMO

## 2018-10-24 DIAGNOSIS — J849 Interstitial pulmonary disease, unspecified: Secondary | ICD-10-CM | POA: Diagnosis not present

## 2018-10-24 LAB — COMPREHENSIVE METABOLIC PANEL
ALT: 17 U/L (ref 0–35)
AST: 21 U/L (ref 0–37)
Albumin: 4 g/dL (ref 3.5–5.2)
Alkaline Phosphatase: 68 U/L (ref 39–117)
BUN: 28 mg/dL — ABNORMAL HIGH (ref 6–23)
CO2: 25 mEq/L (ref 19–32)
Calcium: 9.5 mg/dL (ref 8.4–10.5)
Chloride: 106 mEq/L (ref 96–112)
Creatinine, Ser: 1.42 mg/dL — ABNORMAL HIGH (ref 0.40–1.20)
GFR: 35.35 mL/min — ABNORMAL LOW (ref >60.00)
Glucose, Bld: 301 mg/dL — ABNORMAL HIGH (ref 70–99)
Potassium: 4.7 mEq/L (ref 3.5–5.1)
Sodium: 138 mEq/L (ref 135–145)
Total Bilirubin: 0.5 mg/dL (ref 0.2–1.2)
Total Protein: 6.8 g/dL (ref 6.0–8.3)

## 2018-10-24 LAB — CBC WITH DIFFERENTIAL/PLATELET
Basophils Absolute: 0 10*3/uL (ref 0.0–0.1)
Basophils Relative: 0.7 % (ref 0.0–3.0)
Eosinophils Absolute: 0.2 10*3/uL (ref 0.0–0.7)
Eosinophils Relative: 4.3 % (ref 0.0–5.0)
HCT: 37.3 % (ref 36.0–46.0)
Hemoglobin: 12.5 g/dL (ref 12.0–15.0)
Lymphocytes Relative: 36.6 % (ref 12.0–46.0)
Lymphs Abs: 1.9 10*3/uL (ref 0.7–4.0)
MCHC: 33.6 g/dL (ref 30.0–36.0)
MCV: 98.4 fl (ref 78.0–100.0)
Monocytes Absolute: 0.5 10*3/uL (ref 0.1–1.0)
Monocytes Relative: 9.4 % (ref 3.0–12.0)
Neutro Abs: 2.6 10*3/uL (ref 1.4–7.7)
Neutrophils Relative %: 49 % (ref 43.0–77.0)
Platelets: 230 10*3/uL (ref 150.0–400.0)
RBC: 3.79 Mil/uL — ABNORMAL LOW (ref 3.87–5.11)
RDW: 12 % (ref 11.5–15.5)
WBC: 5.2 10*3/uL (ref 4.0–10.5)

## 2018-10-31 ENCOUNTER — Telehealth: Payer: Self-pay | Admitting: Pulmonary Disease

## 2018-10-31 NOTE — Telephone Encounter (Signed)
Faxed ROI to Dr. Berna Bue 10/12/20fbg

## 2018-11-23 ENCOUNTER — Encounter: Payer: Self-pay | Admitting: Pulmonary Disease

## 2018-11-23 ENCOUNTER — Ambulatory Visit: Payer: Medicare HMO | Admitting: Pulmonary Disease

## 2018-11-23 ENCOUNTER — Other Ambulatory Visit: Payer: Self-pay

## 2018-11-23 VITALS — BP 134/62 | HR 76 | Ht 60.0 in | Wt 150.2 lb

## 2018-11-23 DIAGNOSIS — J849 Interstitial pulmonary disease, unspecified: Secondary | ICD-10-CM
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
## 2018-11-23 NOTE — Patient Instructions (Signed)
We will start you on paperwork for Ofev Follow-up with Dr. Trudie Reed rheumatology Follow-up in pulmonary clinic in 2 weeks.

## 2018-11-25 ENCOUNTER — Telehealth: Payer: Self-pay | Admitting: Pharmacy Technician

## 2018-11-25 NOTE — Telephone Encounter (Signed)
Submitted a Prior Authorization request to [facility] for Ofev via Cover My Meds. Will update once we receive a response.  3:09 PM [name], CPhT

## 2018-11-25 NOTE — Telephone Encounter (Signed)
Received New start paperwork for Ofev 150mg . Will update as the process goes along.  9:17 AM [name], CPhT

## 2018-11-29 NOTE — Telephone Encounter (Signed)
Received notification from [facility] regarding a prior authorization for Ofev. Authorization has been APPROVED from 11/28/2018 to 01/19/2020.   Will send document to scan center.  Authorization # PA Case ID: [id number]

## 2018-11-29 NOTE — Telephone Encounter (Signed)
Ran test claim for 1 month of Ofev 150mg  #60 for 30 days, patient's copay is $2,352.38. Patient is in the catastrophic coverage gap, and will need to proceed with Ofev patient assistance.  1:45 PM [name], CPhT

## 2018-11-30 NOTE — Telephone Encounter (Signed)
Left message for patient to call back, will need income documents to submit with her Ofev patient assistance application.  11:59 AM [name], CPhT

## 2018-12-02 NOTE — Telephone Encounter (Signed)
Left message for patient again to discuss Ofev benefits Investigation results and the information required to move forward with patient assistance.  1:36 PM [name], CPhT

## 2018-12-05 ENCOUNTER — Telehealth: Payer: Self-pay | Admitting: Pharmacist

## 2018-12-05 NOTE — Telephone Encounter (Signed)
Entered in error

## 2018-12-05 NOTE — Telephone Encounter (Signed)
Returned pt's call on 12/05/2018 at 9:15AM.  Explained to patient that patient's copay is $2,352.38 for Ofev 150mg  #60 for 30 days. Patient is in the catastrophic coverage gap, and will need to proceed with Ofev patient assistance. Plan to mail patient [employer] patient assistance program application and instructions for financial documents required. Patient verbalized understanding.  Thank you for involving pharmacy to assist in providing Sandra [name]'s care.   Drexel Iha, PharmD PGY2 Ambulatory Care Pharmacy Resident

## 2018-12-05 NOTE — Telephone Encounter (Signed)
Pt returning missed call and would like a call back from [facility]

## 2018-12-05 NOTE — Telephone Encounter (Signed)
Routing back to [place] for follow-up.

## 2018-12-07 ENCOUNTER — Encounter: Payer: Self-pay | Admitting: Pulmonary Disease

## 2018-12-07 ENCOUNTER — Telehealth: Payer: Self-pay | Admitting: Pulmonary Disease

## 2018-12-07 ENCOUNTER — Ambulatory Visit: Payer: Medicare HMO | Admitting: Pulmonary Disease

## 2018-12-07 ENCOUNTER — Other Ambulatory Visit: Payer: Self-pay

## 2018-12-07 VITALS — BP 124/70 | HR 71 | Temp 97.0°F | Ht 60.0 in | Wt 153.8 lb

## 2018-12-07 DIAGNOSIS — J849 Interstitial pulmonary disease, unspecified: Secondary | ICD-10-CM | POA: Diagnosis not present

## 2018-12-07 MED ORDER — SULFAMETHOXAZOLE-TRIMETHOPRIM 800-160 MG PO TABS: 1.0000 | ORAL_TABLET | ORAL | 2 refills | Status: DC

## 2018-12-07 MED ORDER — MYCOPHENOLATE MOFETIL 500 MG PO TABS: 500.0000 mg | ORAL_TABLET | Freq: Two times a day (BID) | ORAL | 2 refills | Status: DC

## 2018-12-07 NOTE — Telephone Encounter (Signed)
Called and spoke with pt who stated Rachael had contacted pt. Pt said she believed Rachael was needing to know annual income for patient assistance. Stated to pt I would send this to Rachael for her to contact pt.  Routing to [employer]

## 2018-12-07 NOTE — Telephone Encounter (Signed)
Called to follow up on patient's status. Was advised her Ofev prescription was sent to [facility] to check status, rep Lonn [place] advised that they left message for patient to discuss benefits and to get enrolled into patient assistance. They have not been able to reach patient. Pharmacy staff spoke with patient on Monday where she voiced that she has appointment 12/07/18 with provider to discuss if she would like to move forward. Will follow up.  We would also need income documents to submit [employer] Cares Ofev application.  [employer] Phone# [phone]  11:14 AM [name], CPhT
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
## 2018-12-07 NOTE — Patient Instructions (Signed)
We will start you on CellCept 500 mg twice daily and Bactrim for prophylaxis Work through [employer] cares for patient assistance for ofev  Follow up in 4 weeks

## 2018-12-07 NOTE — Telephone Encounter (Signed)
Patient had follow up appointment with Md and discussed moving forward with PAP. Called patient and left message to discuss required income documents for application. [employer] also mailed letter 11/30/18 to patient requesting income documents. Left message. Will follow up.  2:55 PM [name], CPhT

## 2018-12-07 NOTE — Addendum Note (Signed)
Addended by: Hildred Alamin I on: 12/07/2018 02:22 PM   Modules accepted: Orders

## 2018-12-08 ENCOUNTER — Telehealth: Payer: Self-pay | Admitting: Pulmonary Disease

## 2018-12-08 NOTE — Telephone Encounter (Signed)
We had discussed this multiple times during the clinic visits. Please remind her that ofev is the medication we were going to start to reduce the rate of scarring in her lungs.

## 2018-12-08 NOTE — Telephone Encounter (Signed)
Spoke to patient, she will bring income documents to the office by end of the week and will attention to the pharmacy team.  10:27 AM [name], CPhT

## 2018-12-08 NOTE — Telephone Encounter (Signed)
Attempted to call pt but unable to reach. Left message for pt to return call. 

## 2018-12-08 NOTE — Telephone Encounter (Signed)
Spoke with pharmacy they stated the patient refused to have [place] mailed to her home because she didn't know what it was for. She declined and the pharmacist wanted to let Dr. Vaughan Browner know. FYI Dr. Vaughan Browner    Patient Instructions by Marshell Garfinkel, MD at 12/07/2018 1:30 PM Author: Marshell Garfinkel, MD Author Type: Physician Filed: 12/07/2018 2:14 PM  Note Status: Signed Cosign: Cosign Not Required Encounter Date: 12/07/2018  Editor: Marshell Garfinkel, MD (Physician)    We will start you on CellCept 500 mg twice daily and Bactrim for prophylaxis Work through [facility] cares for patient assistance for ofev  Follow up in 4 weeks

## 2018-12-08 NOTE — Telephone Encounter (Signed)
Returned patient's call she will bring proof of income by the office by end of week for her Patient Assistance Application.  Closing Encounter.  10:24 AM [name], CPhT

## 2018-12-09 NOTE — Telephone Encounter (Signed)
Spoke with pt, states that she knows what the [place] is for and that she never declined a delivery from a mail order pharmacy for this medicine.  Pt verified that she does in fact want to take this medication.  I advised that I would call pharmacy to straighten this out, and that the pharmacy would probably have to call her to verify her address before they mail it.  Pt expressed understanding.   Called [employer] mail order pharmacy, states that since the patient stopped the rx the patient would have to be the one to call the pharmacy to approve the rx delivery.  Spoke with pt, aware of this step needed.   While on the phone with the pt the second time, pt mentioned that she did not want to take the Cellcept and Bactrim prescribed at 11/18 office visit d/t side effects.  Forwarding back to Dr. Vaughan Browner as Juluis Rainier.

## 2018-12-09 NOTE — Telephone Encounter (Signed)
Patient dropped income documents off, faxed to [facility]. Will follow up to check status.  8:19 AM [name], CPhT

## 2018-12-09 NOTE — Telephone Encounter (Signed)
Patient called back - she can be reached at [phone]

## 2018-12-12 ENCOUNTER — Telehealth: Payer: Self-pay | Admitting: Pulmonary Disease

## 2018-12-12 NOTE — Telephone Encounter (Signed)
Called [employer] and [employer] to get an update on patient. She was approved for [employer] through 01/19/2019. [facility] states they got patient's copay down to zero. Patient has scheduled shipment of Ofev to deliver on 12/13/18. Advised patient we can always apply for PAP in the future if needed. Patient had no other questions or concerns.  1:36 PM [name], CPhT

## 2018-12-12 NOTE — Telephone Encounter (Signed)
Called [employer] and [employer] to get an update on patient. She was approved for [employer] through 01/19/2019. [facility] states they got patient's copay down to zero. Patient has scheduled shipment of Ofev to deliver on 12/13/18 from [place]. Advised patient we can always apply for PAP in the future if needed. Patient had no other questions or concerns.  1:38 PM Sandra [name], CPhT

## 2018-12-12 NOTE — Telephone Encounter (Signed)
Spoke with pt, states she is returning [place] with pharmacy's call. Routing message to Rachael to follow up on.

## 2018-12-12 NOTE — Telephone Encounter (Signed)
Forwarding to Dr. Vaughan Browner as Juluis Rainier.  Nothing further needed at this time- will close encounter.

## 2018-12-19 NOTE — Telephone Encounter (Signed)
Call made to patient, confirmed DOB, she states started the [place] last Tuesday. She states they also sent her some medication for diarrhea that she has only had to use one time. She also mentioned that she decided not to take the cellcept and bactrim because she read the side effects and did not think the good weighed out weighed the bad. I made her aware I would let Dr. Vaughan Browner know. Voiced understanding.   Nothing further needed at this time.   Will send to Dr. Vaughan Browner as Juluis Rainier.

## 2019-01-06 ENCOUNTER — Ambulatory Visit: Payer: Medicare HMO | Admitting: Pulmonary Disease

## 2019-01-06 ENCOUNTER — Encounter: Payer: Self-pay | Admitting: Pulmonary Disease

## 2019-01-06 ENCOUNTER — Other Ambulatory Visit: Payer: Self-pay

## 2019-01-06 VITALS — BP 120/70 | HR 64 | Temp 97.3°F | Ht 60.0 in | Wt 149.6 lb

## 2019-01-06 DIAGNOSIS — Z5181 Encounter for therapeutic drug level monitoring: Secondary | ICD-10-CM | POA: Diagnosis not present

## 2019-01-06 DIAGNOSIS — J849 Interstitial pulmonary disease, unspecified: Secondary | ICD-10-CM | POA: Diagnosis not present

## 2019-01-06 LAB — COMPREHENSIVE METABOLIC PANEL
ALT: 20 U/L (ref 0–35)
AST: 25 U/L (ref 0–37)
Albumin: 4 g/dL (ref 3.5–5.2)
Alkaline Phosphatase: 59 U/L (ref 39–117)
BUN: 31 mg/dL — ABNORMAL HIGH (ref 6–23)
CO2: 26 mEq/L (ref 19–32)
Calcium: 9.2 mg/dL (ref 8.4–10.5)
Chloride: 109 mEq/L (ref 96–112)
Creatinine, Ser: 1.5 mg/dL — ABNORMAL HIGH (ref 0.40–1.20)
GFR: 33.16 mL/min — ABNORMAL LOW (ref >60.00)
Glucose, Bld: 174 mg/dL — ABNORMAL HIGH (ref 70–99)
Potassium: 4.4 mEq/L (ref 3.5–5.1)
Sodium: 140 mEq/L (ref 135–145)
Total Bilirubin: 0.4 mg/dL (ref 0.2–1.2)
Total Protein: 6.8 g/dL (ref 6.0–8.3)

## 2019-01-06 LAB — CBC WITH DIFFERENTIAL/PLATELET
Basophils Absolute: 0 10*3/uL (ref 0.0–0.1)
Basophils Relative: 0.7 % (ref 0.0–3.0)
Eosinophils Absolute: 0.3 10*3/uL (ref 0.0–0.7)
Eosinophils Relative: 5 % (ref 0.0–5.0)
HCT: 38.7 % (ref 36.0–46.0)
Hemoglobin: 13 g/dL (ref 12.0–15.0)
Lymphocytes Relative: 34.6 % (ref 12.0–46.0)
Lymphs Abs: 2.1 10*3/uL (ref 0.7–4.0)
MCHC: 33.6 g/dL (ref 30.0–36.0)
MCV: 100.4 fl — ABNORMAL HIGH (ref 78.0–100.0)
Monocytes Absolute: 0.6 10*3/uL (ref 0.1–1.0)
Monocytes Relative: 10.4 % (ref 3.0–12.0)
Neutro Abs: 2.9 10*3/uL (ref 1.4–7.7)
Neutrophils Relative %: 49.3 % (ref 43.0–77.0)
Platelets: 250 10*3/uL (ref 150.0–400.0)
RBC: 3.85 Mil/uL — ABNORMAL LOW (ref 3.87–5.11)
RDW: 13.3 % (ref 11.5–15.5)
WBC: 6 10*3/uL (ref 4.0–10.5)
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
## 2019-01-06 NOTE — Addendum Note (Signed)
Addended by: Suzzanne Cloud E on: 01/06/2019 10:13 AM   Modules accepted: Orders
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
## 2019-01-06 NOTE — Patient Instructions (Signed)
Continue the Ofev We will check a comprehensive metabolic panel and CBC today Follow-up in 4 weeks

## 2019-01-06 NOTE — Addendum Note (Signed)
Addended by: Hildred Alamin I on: 01/06/2019 10:11 AM   Modules accepted: Orders

## 2019-02-03 ENCOUNTER — Ambulatory Visit: Payer: Medicare HMO | Admitting: Pulmonary Disease
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
## 2019-02-07 ENCOUNTER — Telehealth: Payer: Self-pay

## 2019-02-07 ENCOUNTER — Ambulatory Visit: Payer: Medicare HMO | Admitting: Cardiology

## 2019-02-07 NOTE — Telephone Encounter (Signed)
Per Dr. Radford Pax, appt today is not needed. Pt was asked to have a 30 day monitor placed in the beginning of 2020, but declined. Dr. Radford Pax would like to speak with pt's neurologist to clarify what she may need. Monitor order can be placed if needed without coming in for an appointment. Pt is aware and had no additional questions. over

## 2019-02-08 ENCOUNTER — Telehealth: Payer: Self-pay | Admitting: Cardiology

## 2019-02-08 NOTE — Telephone Encounter (Signed)
Per last message from [employer]. Patient had an appt with Dr. Radford Pax yesterday but was cancelled due to the fact that Dr. Radford Pax did not feel it was necessary. Patient states that she received a monitor but found it very confusing and sent it back. She states that a neurologist has called her to set up an appointment but she is unsure of what to do. She wants to know if she does not wear the heart monitor if an appt with Dr. Radford Pax is necessary.

## 2019-02-09 NOTE — Telephone Encounter (Signed)
Lets just wait till she is seen in my office to answer all her concerns

## 2019-02-09 NOTE — Telephone Encounter (Signed)
Sandra [name]  This patient is having difficulty understanding how to work her monitor. I'm not sure she will be able to follow through with it. Would you like me to set her up with EP for a loop recorder?  [name]

## 2019-02-09 NOTE — Telephone Encounter (Signed)
I spoke with the patient who states she is very confused on what she should be doing in regards to having an appointment with Dr. Radford Pax and an appointment with Neurology. She states that a neurologist office called her but it was not Dr. Leonie Man who she has seen previously so she did not make an appointment. I advised her that Dr. Radford Pax spoke with Dr. Leonie Man and that they were like for her to wear the heart monitor and she could follow-up after that. She states she is unable to put the heart monitor on herself nor can she read it. I advised her that she could get some assistance getting it put on and after that we would be the ones reading the monitor. Patient stated that she will not be doing that and then hung up the phone.

## 2019-02-13 ENCOUNTER — Ambulatory Visit: Payer: Medicare HMO | Admitting: Pulmonary Disease

## 2019-02-13 NOTE — Telephone Encounter (Signed)
Yes. I'll try reaching out to her again and explain the situation

## 2019-02-20 ENCOUNTER — Encounter: Payer: Self-pay | Admitting: Pulmonary Disease

## 2019-02-20 ENCOUNTER — Other Ambulatory Visit: Payer: Self-pay

## 2019-02-20 ENCOUNTER — Ambulatory Visit: Payer: Medicare HMO | Admitting: Pulmonary Disease

## 2019-02-20 VITALS — BP 120/68 | HR 67 | Temp 97.0°F | Ht 60.0 in | Wt 148.4 lb

## 2019-02-20 DIAGNOSIS — J849 Interstitial pulmonary disease, unspecified: Secondary | ICD-10-CM

## 2019-02-20 LAB — CBC WITH DIFFERENTIAL/PLATELET
Basophils Absolute: 0.1 10*3/uL (ref 0.0–0.1)
Basophils Relative: 0.8 % (ref 0.0–3.0)
Eosinophils Absolute: 0.3 10*3/uL (ref 0.0–0.7)
Eosinophils Relative: 3.8 % (ref 0.0–5.0)
HCT: 36.9 % (ref 36.0–46.0)
Hemoglobin: 12.4 g/dL (ref 12.0–15.0)
Lymphocytes Relative: 28 % (ref 12.0–46.0)
Lymphs Abs: 1.9 10*3/uL (ref 0.7–4.0)
MCHC: 33.6 g/dL (ref 30.0–36.0)
MCV: 101.2 fl — ABNORMAL HIGH (ref 78.0–100.0)
Monocytes Absolute: 0.6 10*3/uL (ref 0.1–1.0)
Monocytes Relative: 8.2 % (ref 3.0–12.0)
Neutro Abs: 4.1 10*3/uL (ref 1.4–7.7)
Neutrophils Relative %: 59.2 % (ref 43.0–77.0)
Platelets: 311 10*3/uL (ref 150.0–400.0)
RBC: 3.65 Mil/uL — ABNORMAL LOW (ref 3.87–5.11)
RDW: 12.9 % (ref 11.5–15.5)
WBC: 6.9 10*3/uL (ref 4.0–10.5)

## 2019-02-20 LAB — COMPREHENSIVE METABOLIC PANEL
ALT: 23 U/L (ref 0–35)
AST: 22 U/L (ref 0–37)
Albumin: 3.7 g/dL (ref 3.5–5.2)
Alkaline Phosphatase: 63 U/L (ref 39–117)
BUN: 28 mg/dL — ABNORMAL HIGH (ref 6–23)
CO2: 24 mEq/L (ref 19–32)
Calcium: 9 mg/dL (ref 8.4–10.5)
Chloride: 105 mEq/L (ref 96–112)
Creatinine, Ser: 1.44 mg/dL — ABNORMAL HIGH (ref 0.40–1.20)
GFR: 34.75 mL/min — ABNORMAL LOW (ref >60.00)
Glucose, Bld: 298 mg/dL — ABNORMAL HIGH (ref 70–99)
Potassium: 4.8 mEq/L (ref 3.5–5.1)
Sodium: 137 mEq/L (ref 135–145)
Total Bilirubin: 0.5 mg/dL (ref 0.2–1.2)
Total Protein: 6.5 g/dL (ref 6.0–8.3)
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
## 2019-02-20 NOTE — Patient Instructions (Signed)
Am glad you are doing well Continue with the Imodium for diarrhea We will check today including comprehensive metabolic panel and CBC Follow-up in 1 month

## 2019-02-20 NOTE — Addendum Note (Signed)
Addended by: Suzzanne Cloud E on: 02/20/2019 11:18 AM   Modules accepted: Orders

## 2019-03-14 ENCOUNTER — Ambulatory Visit: Payer: Medicare HMO | Admitting: Neurology

## 2019-03-14 ENCOUNTER — Other Ambulatory Visit: Payer: Self-pay

## 2019-03-14 ENCOUNTER — Encounter: Payer: Self-pay | Admitting: Neurology

## 2019-03-14 VITALS — BP 140/64 | HR 69 | Temp 97.5°F | Ht 62.0 in | Wt 145.2 lb

## 2019-03-14 DIAGNOSIS — R202 Paresthesia of skin: Secondary | ICD-10-CM

## 2019-03-14 DIAGNOSIS — G459 Transient cerebral ischemic attack, unspecified: Secondary | ICD-10-CM | POA: Diagnosis not present

## 2019-03-14 NOTE — Progress Notes (Addendum)
[facility] [address] [place]. [place] [address] [phone]       OFFICE CONSULT NOTE  [name] Date of Birth:  Oct 22, 1933 Medical Record Number:  [id number]   Referring MD: Anice Paganini, PA-C Reason for Referral: TIA HPI: [name] is a 86 year old pleasant Caucasian lady seen today for initial office consultation visit for TIA.  History is obtained from the patient, review of electronic medical records and I personally reviewed imaging films in PACS.  She presented to [facility] on [facility]/17/2018 with transient slurred speech left-sided weakness and numbness in apparently facial droop but the symptoms resolved by the time EMS was bringing her symptoms recurred again and code stroke was called in route.  Patient arrived with blood pressure significantly elevated at 222/73 she complained of right supraorbital headache and after blood pressure was controlled with Cleviprex drip symptoms resolved.  MRI scan of the brain shows a questionable right occipital cortical diffusion hyperintensity which radiologist was not sure was an actual infarct or artifact but I was unable to view these images personally as the images were not opening in PACS.  She subsequently stated that she had not taken her blood pressure medication for the past 2 days.  Today she tells me that she actually had numbness around her right lip and subsequently the numbness spread down her right arm as well as down into the right leg over a minute or so.  She also described to me today that her right hand was drawn and crampy.  This seems to have happened a few other times and she is scared that this may be beginning of a TIA or stroke but it has not progressed.  The patient does have a remote history of left parietal convexity subarachnoid hemorrhage in 2012 for which actually I saw her and was felt to be reversible cerebral vasoconstriction syndrome.  At that time she had had some paresthesias  affecting the right face and leg.  EEG was normal but she was put on a trial of Depakote for both headache and sensory seizures and she did well and on follow-up visit with me Depakote was stopped.  She had done well until recently.  Patient denies any altered awareness or headache but she is not a good historian.  She denies any other symptoms of stroke or TIA.  She does have significant vascular risk factors of hypertension, hyperlipidemia and diabetes.  She is currently on aspirin 81 mg which is tolerating well without bleeding or bruising.  She states her blood pressure is typically under good control it runs in the 824 systolic range and today it is 140/64 in office.  Patient was recommended to wear a 30-day heart monitor upon discharge but she was confused about directions and cardiology office called her and said that it will come in the mail.  She prefers to go in person to the cardiology office to get have it placed on her chest.  She has had no recurrent stroke or TIA symptoms.  ROS:   14 system review of systems is positive for numbness, tingling, depression, anxiety, snoring, wheezing, shortness of breath, runny nose, itching and all other systems negative  PMH:  Past Medical History:  Diagnosis Date  . Abnormal blood finding 02/10/2016  . Abrasion of ear canal 09/28/2012  . Achilles tendon injury 11/27/2010  . ACID REFLUX DISEASE 05/12/2006   Qualifier: Diagnosis of  By: Larose Kells MD, [place] Acute MI ([place])   .  ALLERGIC RHINITIS 05/12/2006   Qualifier: Diagnosis of  By: Larose Kells MD, [place] Anxiety state 07/03/2009   Qualifier: Diagnosis of  By: Larose Kells MD, [place] pain 07/01/2010  . Chronic kidney disease (CKD), stage III (moderate) ([place]) 05/31/2014  . Chronic right SI joint pain 09/22/2013  . DEGENERATIVE JOINT DISEASE, CERVICAL SPINE 06/23/2006   Annotation: had a CAT scan with a cervical myelogram that show  left C6-7 and  C5-6 foraminal stenosis Qualifier: Diagnosis of  By: Larose Kells MD, [place] DEPRESSION 05/12/2006   Qualifier: Diagnosis of  By: Larose Kells MD, [place] DIABETES MELLITUS, TYPE II 05/12/2006   Now following w/ Endo at [place] 04/27/2007   Qualifier: Diagnosis of  By: Jerold Coombe    . Dyspnea 02/10/2016  . Encephalopathy, hypertensive   . Essential hypertension 05/12/2006   Qualifier: Diagnosis of  By: Larose Kells MD, [place] Fatigue 05/31/2014  . GAIT DISTURBANCE 08/07/2009   Qualifier: Diagnosis of  By: Larose Kells MD, [place] GERD (gastroesophageal reflux disease)   . Headache(784.0) 11/27/2010  . Hyperlipidemia associated with type 2 diabetes mellitus ([place]) 05/12/2006   Qualifier: Diagnosis of  By: Larose Kells MD, [place] ILD (interstitial lung disease) ([place]) 05/31/2015  . Leukocytopenia 05/31/2014  . Lower abdominal pain 05/17/2014  . Nausea with vomiting 09/22/2013  . NECK PAIN, CHRONIC 04/03/2008   Qualifier: Diagnosis of  By: Larose Kells MD, [place] Nodule on liver 12/06/2015  . Osteoarthritis 02/10/2016  . Osteopenia   . Osteoporosis 06/23/2006   Annotation: had a bone density test in 08-2004.  T score was -2.4 Qualifier: Diagnosis of  By: Larose Kells MD, [place] SAH (subarachnoid hemorrhage) ([place])   . Sciatica   . Scleroderma ([place]) 02/28/2016  . Severe hypertension 03/18/2018  . Slurred speech 11/27/2010  . Takotsubo cardiomyopathy    s/p stress MI with stress induced CM with normal coronary arteries by cath 2007 with normalization of LVF by echo 04/2005  . TIA (transient ischemic attack)   . Unspecified cerebral artery occlusion with cerebral infarction 04/07/2012  . UTI (urinary tract infection) 11/02/2011  . 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 Vitamin D deficiency 10/01/2014    Social History:  Social History   Socioeconomic History  . Marital status: Divorced    Spouse name: Not on file  . Number of children: 2  . Years of education: Not on file  . Highest education level: Not on file  Occupational History  . Occupation: Retired     Comment: Press photographer  Tobacco Use  . Smoking status: Passive Smoke Exposure - Never Smoker  . Smokeless tobacco: Never Used  . Tobacco comment: Mother & Children  Substance and Sexual Activity  . Alcohol use: Yes    Alcohol/week: 1.0 standard drinks    Types: 1 Glasses of wine per week  . Drug use: No  . Sexual activity: Never  Other Topics Concern  . Not on file  Social History Narrative   Lives with her daughter and G-daughter   Diet- working on portion control   Exercise- no routine exercise but active      [facility]:   Originally from [place]. Always lived in [place]. Previously has traveled to [place], [place], [place], [place], & [place]. Previously did accounting and clerical work. Has a dog  currently. Remote exposure to a parakeet. No mold exposure.    Social Determinants of Health   Financial Resource Strain:   . Difficulty of Paying Living Expenses: Not on file  Food Insecurity:   . Worried About Charity fundraiser in the Last Year: Not on file  . Ran Out of Food in the Last Year: Not on file  Transportation Needs:   . Lack of Transportation (Medical): Not on file  . Lack of Transportation (Non-Medical): Not on file  Physical Activity:   . Days of Exercise per Week: Not on file  . Minutes of Exercise per Session: Not on file  Stress:   . Feeling of Stress : Not on file  Social Connections:   . Frequency of Communication with Friends and Family: Not on file  . Frequency of Social Gatherings with Friends and Family: Not on file  . Attends Religious Services: Not on file  . Active Member of Clubs or Organizations: Not on file  . Attends Archivist Meetings: Not on file  . Marital Status: Not on file  Intimate Partner Violence:   . Fear of Current or Ex-Partner: Not on file  . Emotionally Abused: Not on file  . Physically Abused: Not on file  . Sexually Abused: Not on file    Medications:   Current Outpatient Medications on File Prior to Visit  Medication Sig  Dispense Refill  . ACCU-CHEK FASTCLIX LANCETS MISC     . ACCU-CHEK SMARTVIEW test strip     . aspirin 81 MG tablet Take 81 mg by mouth daily.    Marland Kitchen atorvastatin (LIPITOR) 40 MG tablet Take 1 tablet (40 mg total) by mouth daily at 6 PM. 30 tablet 0  . clonazePAM (KLONOPIN) 0.5 MG tablet TAKE 1 TABLET TWICE DAILY AS NEEDED FOR ANXIETY (Patient taking differently: Take 0.5 mg by mouth 2 (two) times daily as needed for anxiety. ) 90 tablet 1  . Cyanocobalamin (VITAMIN B-12 PO) Take 1 tablet by mouth daily.     . famotidine (PEPCID) 40 MG tablet Take 40 mg by mouth daily.     . feeding supplement, ENSURE ENLIVE, (ENSURE ENLIVE) LIQD Take 237 mLs by mouth 2 (two) times daily between meals. 237 mL 12  . fenofibrate 160 MG tablet TAKE 1 TABLET EVERY DAY (Patient taking differently: Take 160 mg by mouth daily. ) 90 tablet 1  . glimepiride (AMARYL) 4 MG tablet Take 2 mg by mouth 2 (two) times daily.   2  . HYDROcodone-acetaminophen (NORCO/VICODIN) 5-325 MG tablet Take 1 tablet by mouth every 6 (six) hours as needed for moderate pain. 45 tablet 0  . Insulin NPH, Human,, Isophane, (HUMULIN N) 100 UNIT/ML Kiwkpen Inject into the skin. Take 36 units am and 6 units pm    . insulin regular (NOVOLIN R) 100 units/mL injection Inject into the skin 3 (three) times daily before meals. 23 unites before meals    . Insulin Syringe-Needle U-100 (INSULIN SYRINGE .5CC/31GX5/16") 31G X 5/16" 0.5 ML MISC 40 units in morning and 18units at night    . lisinopril (PRINIVIL,ZESTRIL) 10 MG tablet Take 10 mg by mouth daily.     . metoprolol succinate (TOPROL-XL) 50 MG 24 hr tablet TAKE 1 TABLET EVERY DAY.  TAKE  WITH  OR  IMMEDIATELY FOLLOWING A MEAL  (Patient taking differently: Take 50 mg by mouth daily. ) 90 tablet 1  . mycophenolate (CELLCEPT) 500 MG tablet TAKE 1 TABLET BY MOUTH TWICE DAILY    .  OFEV 150 MG CAPS Take 150 mg by mouth 2 (two) times daily.      No current facility-administered medications on file prior to visit.      Allergies:   Allergies  Allergen Reactions  . Cephalexin Nausea And Vomiting    Pt stated made severely sick, will never take again  . Pioglitazone Other (See Comments)    REACTION: EDEMA    Physical Exam General: well developed, well nourished elderly Caucasian lady seated, in no evident distress Head: head normocephalic and atraumatic.   Neck: supple with no carotid or supraclavicular bruits Cardiovascular: regular rate and rhythm, no murmurs Musculoskeletal: no deformity Skin:  no rash/petichiae Vascular:  Normal pulses all extremities  Neurologic Exam Mental Status: Awake and fully alert. Oriented to place and time. Recent and remote memory intact. Attention span, concentration and fund of knowledge appropriate. Mood and affect appropriate.  Cranial Nerves: Fundoscopic exam reveals sharp disc margins. Pupils equal, briskly reactive to light. Extraocular movements full without nystagmus. Visual fields full to confrontation. Hearing intact. Facial sensation intact. Face, tongue, palate moves normally and symmetrically.  Motor: Normal bulk and tone. Normal strength in all tested extremity muscles. Sensory.: intact to touch , pinprick , position and vibratory sensation.  Coordination: Rapid alternating movements normal in all extremities. Finger-to-nose and heel-to-shin performed accurately bilaterally. Gait and Station: Arises from chair without difficulty. Stance is normal. Gait demonstrates normal stride length and balance . Able to heel, toe and tandem walk with moderate difficulty.  Reflexes: 1+ and symmetric. Toes downgoing.   NIHSS  0 Modified Rankin  0   ASSESSMENT: 86 year old Caucasian lady with transient episode of face and right body paresthesias along with right hand drawing up  in February 2020 possibly TIA though sensory seizure or complicated migraine is also possible..  She has remote history of left parietal convexity hemorrhage due to RCVS in 2012 and question  of  seizures but had done well since then.     PLAN: I had a long discussion with the patient regarding her episode of transient face and right body paresthesias and possible TIA though complicated migraine or sensory seizure is less likely.  I recommend checking EEG for seizure activity.  Continue aspirin for stroke prevention with strict control of diabetes with hemoglobin A1c goal below 6.5%, lipids with LDL cholesterol goal below 70 mg percent and hypertension with blood pressure goal below 130/90.  Patient will get 30-day heart monitor which was recommended but has not been done yet to look for cardiac arrhythmias.  Greater than 50% time during this 50-minute consultation visit was spent on counseling and coordination of care about her episode of paresthesias and TIA and answering questions she will return for follow-up in the future in 3 months with my nurse practitioner Janett Billow or call earlier if necessary. Antony Contras, MD  [facility] [address] [place], [place] [address]  Phone [phone] Fax [phone] Note: This document was prepared with digital dictation and possible smart phrase technology. Any transcriptional errors that result from this process are unintentional.

## 2019-03-14 NOTE — Patient Instructions (Signed)
I had a long discussion with the patient regarding her episode of transient face and right body paresthesias and possible TIA though complicated migraine or sensory seizure is less likely.  I recommend checking EEG for seizure activity.  Continue aspirin for stroke prevention with strict control of diabetes with hemoglobin A1c goal below 6.5%, lipids with LDL cholesterol goal below 70 mg percent and hypertension with blood pressure goal below 130/90.  Patient will get 30-day heart monitor which was recommended but has not been done yet to look for cardiac arrhythmias.  She will return for follow-up in the future in 3 months with my nurse practitioner Janett Billow or call earlier if necessary.  Stroke Prevention Some medical conditions and behaviors are associated with a higher chance of having a stroke. You can help prevent a stroke by making nutrition, lifestyle, and other changes, including managing any medical conditions you may have. What nutrition changes can be made?   Eat healthy foods. You can do this by: ? Choosing foods high in fiber, such as fresh fruits and vegetables and whole grains. ? Eating at least 5 or more servings of fruits and vegetables a day. Try to fill half of your plate at each meal with fruits and vegetables. ? Choosing lean protein foods, such as lean cuts of meat, poultry without skin, fish, tofu, beans, and nuts. ? Eating low-fat dairy products. ? Avoiding foods that are high in salt (sodium). This can help lower blood pressure. ? Avoiding foods that have saturated fat, trans fat, and cholesterol. This can help prevent high cholesterol. ? Avoiding processed and premade foods.  Follow your health care provider's specific guidelines for losing weight, controlling high blood pressure (hypertension), lowering high cholesterol, and managing diabetes. These may include: ? Reducing your daily calorie intake. ? Limiting your daily sodium intake to 1,500 milligrams (mg). ? Using  only healthy fats for cooking, such as olive oil, canola oil, or sunflower oil. ? Counting your daily carbohydrate intake. What lifestyle changes can be made?  Maintain a healthy weight. Talk to your health care provider about your ideal weight.  Get at least 30 minutes of moderate physical activity at least 5 days a week. Moderate activity includes brisk walking, biking, and swimming.  Do not use any products that contain nicotine or tobacco, such as cigarettes and e-cigarettes. If you need help quitting, ask your health care provider. It may also be helpful to avoid exposure to secondhand smoke.  Limit alcohol intake to no more than 1 drink a day for nonpregnant women and 2 drinks a day for men. One drink equals 12 oz of beer, 5 oz of wine, or 1 oz of hard liquor.  Stop any illegal drug use.  Avoid taking birth control pills. Talk to your health care provider about the risks of taking birth control pills if: ? You are over 24 years old. ? You smoke. ? You get migraines. ? You have ever had a blood clot. What other changes can be made?  Manage your cholesterol levels. ? Eating a healthy diet is important for preventing high cholesterol. If cholesterol cannot be managed through diet alone, you may also need to take medicines. ? Take any prescribed medicines to control your cholesterol as told by your health care provider.  Manage your diabetes. ? Eating a healthy diet and exercising regularly are important parts of managing your blood sugar. If your blood sugar cannot be managed through diet and exercise, you may need to take medicines. ?  Take any prescribed medicines to control your diabetes as told by your health care provider.  Control your hypertension. ? To reduce your risk of stroke, try to keep your blood pressure below 130/80. ? Eating a healthy diet and exercising regularly are an important part of controlling your blood pressure. If your blood pressure cannot be managed  through diet and exercise, you may need to take medicines. ? Take any prescribed medicines to control hypertension as told by your health care provider. ? Ask your health care provider if you should monitor your blood pressure at home. ? Have your blood pressure checked every year, even if your blood pressure is normal. Blood pressure increases with age and some medical conditions.  Get evaluated for sleep disorders (sleep apnea). Talk to your health care provider about getting a sleep evaluation if you snore a lot or have excessive sleepiness.  Take over-the-counter and prescription medicines only as told by your health care provider. Aspirin or blood thinners (antiplatelets or anticoagulants) may be recommended to reduce your risk of forming blood clots that can lead to stroke.  Make sure that any other medical conditions you have, such as atrial fibrillation or atherosclerosis, are managed. What are the warning signs of a stroke? The warning signs of a stroke can be easily remembered as BEFAST.  B is for balance. Signs include: ? Dizziness. ? Loss of balance or coordination. ? Sudden trouble walking.  E is for eyes. Signs include: ? A sudden change in vision. ? Trouble seeing.  F is for face. Signs include: ? Sudden weakness or numbness of the face. ? The face or eyelid drooping to one side.  A is for arms. Signs include: ? Sudden weakness or numbness of the arm, usually on one side of the body.  S is for speech. Signs include: ? Trouble speaking (aphasia). ? Trouble understanding.  T is for time. ? These symptoms may represent a serious problem that is an emergency. Do not wait to see if the symptoms will go away. Get medical help right away. Call your local emergency services (911 in the [place] Do not drive yourself to the hospital.  Other signs of stroke may include: ? A sudden, severe headache with no known cause. ? Nausea or vomiting. ? Seizure. Where to find more  information For more information, visit:  American Stroke Association: www.strokeassociation.org  National Stroke Association: www.stroke.org Summary  You can prevent a stroke by eating healthy, exercising, not smoking, limiting alcohol intake, and managing any medical conditions you may have.  Do not use any products that contain nicotine or tobacco, such as cigarettes and e-cigarettes. If you need help quitting, ask your health care provider. It may also be helpful to avoid exposure to secondhand smoke.  Remember BEFAST for warning signs of stroke. Get help right away if you or a loved one has any of these signs. This information is not intended to replace advice given to you by your health care provider. Make sure you discuss any questions you have with your health care provider. Document Revised: 12/18/2016 Document Reviewed: 02/11/2016 Elsevier Patient Education  2020 [employer].

## 2019-03-16 ENCOUNTER — Telehealth: Payer: Self-pay

## 2019-03-16 NOTE — Telephone Encounter (Signed)
Unable to comment in the result notes tab due to error message saying launch failed. E2N Filters Failed. I called and made patient aware of lab results. She stated that she has a dr that she sees for her diabetes and will follow up with them. She verbalized her understanding and nothing further needed.

## 2019-03-16 NOTE — Telephone Encounter (Signed)
-----   Message from Marshell Garfinkel, MD sent at 03/15/2019  8:21 AM EST ----- Labs are stable except for elevation in blood sugars. Please have her follow up with her primary care regarding better diabetes control.

## 2019-03-22 ENCOUNTER — Telehealth: Payer: Self-pay | Admitting: Neurology

## 2019-03-22 ENCOUNTER — Other Ambulatory Visit: Payer: Self-pay

## 2019-03-22 ENCOUNTER — Ambulatory Visit: Payer: Medicare HMO | Admitting: Pulmonary Disease

## 2019-03-22 ENCOUNTER — Encounter: Payer: Self-pay | Admitting: Pulmonary Disease

## 2019-03-22 VITALS — BP 112/64 | HR 68 | Temp 97.8°F | Ht 60.0 in | Wt 143.4 lb

## 2019-03-22 DIAGNOSIS — J849 Interstitial pulmonary disease, unspecified: Secondary | ICD-10-CM | POA: Diagnosis not present

## 2019-03-22 DIAGNOSIS — Z5181 Encounter for therapeutic drug level monitoring: Secondary | ICD-10-CM | POA: Diagnosis not present

## 2019-03-22 LAB — COMPREHENSIVE METABOLIC PANEL
ALT: 40 U/L — ABNORMAL HIGH (ref 0–35)
AST: 39 U/L — ABNORMAL HIGH (ref 0–37)
Albumin: 3.5 g/dL (ref 3.5–5.2)
Alkaline Phosphatase: 70 U/L (ref 39–117)
BUN: 28 mg/dL — ABNORMAL HIGH (ref 6–23)
CO2: 24 mEq/L (ref 19–32)
Calcium: 8.9 mg/dL (ref 8.4–10.5)
Chloride: 105 mEq/L (ref 96–112)
Creatinine, Ser: 1.53 mg/dL — ABNORMAL HIGH (ref 0.40–1.20)
GFR: 32.4 mL/min — ABNORMAL LOW (ref >60.00)
Glucose, Bld: 396 mg/dL — ABNORMAL HIGH (ref 70–99)
Potassium: 4.6 mEq/L (ref 3.5–5.1)
Sodium: 135 mEq/L (ref 135–145)
Total Bilirubin: 0.3 mg/dL (ref 0.2–1.2)
Total Protein: 6.3 g/dL (ref 6.0–8.3)

## 2019-03-22 MED ORDER — OFEV 100 MG PO CAPS: 100.0000 mg | ORAL_CAPSULE | Freq: Two times a day (BID) | ORAL | 5 refills | Status: DC

## 2019-03-22 NOTE — Patient Instructions (Signed)
We will start Ofev to 100 mg twice daily since you are having diarrhea Continue the Imodium We will check a comprehensive metabolic panel today  Follow-up in 1 month with me or nurse practitioner
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
## 2019-03-22 NOTE — Telephone Encounter (Signed)
Patient had an OV today with Dr. Vaughan Browner who has recently addressed the Cellcept. Appears cellcept is currently on hold. I called the pt back and LVM advising she should discuss her medication question with Dr. Vaughan Browner. I did not leave detailed medication name in the message. Asked for return call to confirm receipt of this message.   When the pt calls back, please let her know she should call Dr. Matilde Bash office for questions about her cellcept.

## 2019-03-22 NOTE — Telephone Encounter (Signed)
Pt called wanting to speak to RN about mycophenolate (CELLCEPT) 500 MG tablet Pt states that she does not know what this is for. Please advise.

## 2019-03-27 ENCOUNTER — Telehealth: Payer: Self-pay | Admitting: Pulmonary Disease

## 2019-03-27 NOTE — Telephone Encounter (Signed)
Called and spoke with pt who stated due to the amount of diarrhea she has been having from the [facility], she no longer wants to take it. Pt stated Dr. Vaughan Browner already knows about the problems she has been having.  Routing to Dr. Vaughan Browner as an Juluis Rainier.

## 2019-04-05 ENCOUNTER — Telehealth: Payer: Self-pay

## 2019-04-05 DIAGNOSIS — Z5181 Encounter for therapeutic drug level monitoring: Secondary | ICD-10-CM

## 2019-04-05 NOTE — Telephone Encounter (Signed)
-----   Message from Marshell Garfinkel, MD sent at 04/05/2019  5:38 AM EDT ----- Please let patient know that labs show elevated blood sugars. She will need follow up with her primary care doctor and better control of diabetes.  Also liver tests are slightly high. Please order CMP in 1 month for follow up

## 2019-04-10 ENCOUNTER — Ambulatory Visit: Payer: Medicare HMO | Admitting: Neurology

## 2019-04-10 ENCOUNTER — Other Ambulatory Visit: Payer: Self-pay

## 2019-04-10 DIAGNOSIS — G459 Transient cerebral ischemic attack, unspecified: Secondary | ICD-10-CM

## 2019-04-10 DIAGNOSIS — R202 Paresthesia of skin: Secondary | ICD-10-CM

## 2019-04-10 DIAGNOSIS — R252 Cramp and spasm: Secondary | ICD-10-CM

## 2019-04-13 NOTE — Progress Notes (Signed)
   [facility]  EEG (ELECTROENCEPHALOGRAM) REPORT   STUDY DATE: 04/10/2019 PATIENT NAME: Sandra [name] DOB: 1933/05/28 MRN: [id number]  ORDERING CLINICIAN: Antony Contras, MD  TECHNOLOGIST: Nonda Lou, RPSGT TECHNIQUE: Electroencephalogram was recorded utilizing standard 10-20 system of lead placement and reformatted into average and bipolar montages.  RECORDING TIME: 27 minutes 18 seconds  CLINICAL INFORMATION: 86 year old woman with TIA  FINDINGS: A digital EEG was performed while the patient was awake and drowsy. While awake and most alert there was a 9 hz posterior dominant rhythm. Voltages and frequencies were symmetric.  There were no focal, lateralizing, epileptiform activity or seizures seen.  Photic stimulation had a normal driving response. Hyperventilation and recovery did not change the underlying rhythms. EKG channel shows normal sinus rhythm with 2 PVCs noted during the recording.  The patient remained awake throughout the recording.  IMPRESSION: This is a normal EEG while the patient was awake.   INTERPRETING PHYSICIAN:   Terrilyn Tyner A. Felecia Shelling, MD, PhD, [facility] Certified in Neurology, Clinical Neurophysiology, Sleep Medicine, Pain Medicine and Neuroimaging  [facility] [address] [place], [place] [address] [phone]

## 2019-04-24 ENCOUNTER — Encounter: Payer: Self-pay | Admitting: Adult Health

## 2019-04-24 ENCOUNTER — Ambulatory Visit: Payer: Medicare HMO | Admitting: Adult Health

## 2019-04-24 ENCOUNTER — Other Ambulatory Visit: Payer: Self-pay

## 2019-04-24 DIAGNOSIS — R197 Diarrhea, unspecified: Secondary | ICD-10-CM | POA: Diagnosis not present

## 2019-04-24 DIAGNOSIS — J849 Interstitial pulmonary disease, unspecified: Secondary | ICD-10-CM | POA: Diagnosis not present

## 2019-04-24 NOTE — Assessment & Plan Note (Signed)
Interstitial lung disease-progressive changes noted on high-resolution CT chest in September 2020.  Patient was started on antifibrotic's with Ofev in November 2020.  Patient had significant medication side effects. Long discussion with patient regarding possibility of other treatment options including Esbriet and CellCept along with Bactrim.  Patient does not like the side effect profile from CellCept.  She was to hold off on Esbriet at this time.  Will discuss on follow-up visit.  Plan  Patient Instructions  Activity as tolerated.  High protein diet.  Remain off OFEV .  Covid vaccine as discussed.  Follow up in 3 months with Dr. Vaughan Browner or Geraldina Parrott NP and As needed

## 2019-04-24 NOTE — Patient Instructions (Signed)
Activity as tolerated.  High protein diet.  Remain off OFEV .  Covid vaccine as discussed.  Follow up in 3 months with Dr. Vaughan Browner or Raymone Pembroke NP and As needed

## 2019-04-24 NOTE — Assessment & Plan Note (Signed)
Resolved off OFEV. Patient declines restart at lower dose.
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
## 2019-04-25 NOTE — Progress Notes (Signed)
Patient referred by Nickola Major, MD for TIA  Subjective:   Sandra [name], female    DOB: 04/18/1933, 86 y.o.   MRN: [id number]   Chief Complaint  Patient presents with  . Transient Ischemic Attack  . 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  It appears that patient was recommended event monitor after her TIA in 02/2018. Patient was then seeing Dr. Radford Pax. According to the patient, this got delayed due to [place]. Eventually, when she did receive the monitor, she found it very confusing to use and thus did not wear it.   She has not had any recurrent stroke/TIA symptoms since 02/2018. More recently, she was recommended EEG by Dr. Leonie Man for evaluation of suspected seizure.   Patient is functional 86 y/o, lives with her daughter and granddaughter. Today, she got lost for directions. Her blood pressure is elevated, which she attributes to the stress of finding our office. Recent blood pressure readings have been normal.     Past Medical History:  Diagnosis Date  . Abnormal blood finding 02/10/2016  . Abrasion of ear canal 09/28/2012  . Achilles tendon injury 11/27/2010  . ACID REFLUX DISEASE 05/12/2006   Qualifier: Diagnosis of  By: Larose Kells MD, [place] Acute MI ([place])   . ALLERGIC RHINITIS 05/12/2006   Qualifier: Diagnosis of  By: Larose Kells MD, [place] Anxiety state 07/03/2009   Qualifier: Diagnosis of  By: Larose Kells MD, [place] pain 07/01/2010  . Chronic kidney disease (CKD), stage III (moderate) ([place]) 05/31/2014  . Chronic right SI joint pain 09/22/2013  . DEGENERATIVE JOINT DISEASE, CERVICAL SPINE 06/23/2006   Annotation: had a CAT scan with a cervical myelogram that show  left C6-7 and  C5-6 foraminal stenosis Qualifier: Diagnosis of  By: Larose Kells MD, [place] DEPRESSION 05/12/2006   Qualifier: Diagnosis of  By: Larose Kells MD, [place] DIABETES MELLITUS, TYPE II 05/12/2006   Now following w/ Endo at [place] 04/27/2007   Qualifier: Diagnosis of  By: Jerold Coombe    . Dyspnea 02/10/2016  . Encephalopathy, hypertensive   . Essential hypertension 05/12/2006   Qualifier: Diagnosis of  By: Larose Kells MD, [place] Fatigue 05/31/2014  . GAIT DISTURBANCE 08/07/2009   Qualifier: Diagnosis of  By: Larose Kells MD, [place] GERD (gastroesophageal reflux disease)   . Headache(784.0) 11/27/2010  . Hyperlipidemia associated with type 2 diabetes mellitus ([place]) 05/12/2006   Qualifier: Diagnosis of  By: Larose Kells MD, [place] ILD (interstitial lung disease) ([place]) 05/31/2015  . Leukocytopenia 05/31/2014  . Lower abdominal pain 05/17/2014  . Nausea with vomiting 09/22/2013  . NECK PAIN, CHRONIC 04/03/2008   Qualifier: Diagnosis of  By: Larose Kells MD, [place] Nodule on liver 12/06/2015  . Osteoarthritis 02/10/2016  . Osteopenia   . Osteoporosis 06/23/2006   Annotation: had a bone density test in 08-2004.  T score was -2.4 Qualifier: Diagnosis of  By: Larose Kells MD, [place] SAH (subarachnoid hemorrhage) ([place])   . Sciatica   . Scleroderma ([place]) 02/28/2016  . Severe hypertension 03/18/2018  . Slurred speech 11/27/2010  . Takotsubo cardiomyopathy    s/p stress MI with stress induced CM with normal coronary arteries by  cath 2007 with normalization of LVF by echo 04/2005  . TIA (transient ischemic attack)   . Unspecified cerebral artery occlusion with cerebral infarction 04/07/2012  . UTI (urinary tract infection) 11/02/2011  . 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 Vitamin D deficiency 10/01/2014     Past Surgical History:  Procedure Laterality Date  . ABDOMINAL HYSTERECTOMY  1977   no oophorectomy  . APPENDECTOMY    . CARDIAC CATHETERIZATION    . CATARACT EXTRACTION, BILATERAL  2011  . SPINAL FUSION  2000   Dr. Vertell Limber  . TONSILLECTOMY       Social History   Tobacco Use  Smoking Status Passive Smoke Exposure - Never Smoker  Smokeless Tobacco Never Used  Tobacco Comment    Mother & Children    Social History   Substance and Sexual Activity  Alcohol Use Yes  . Alcohol/week: 1.0 standard drinks  . Types: 1 Glasses of wine per week     Family History  Problem Relation Age of Onset  . Intracerebral hemorrhage Daughter        assoc with post-partum  . Stroke Mother   . Diabetes Sister   . Asthma Sister   . Diabetes Sister   . Breast cancer Other        ?Aunt  . Cancer Other        breast?  . Coronary artery disease Neg Hx      Current Outpatient Medications on File Prior to Visit  Medication Sig Dispense Refill  . ACCU-CHEK FASTCLIX LANCETS MISC     . ACCU-CHEK SMARTVIEW test strip     . aspirin 81 MG tablet Take 81 mg by mouth daily.    Marland Kitchen atorvastatin (LIPITOR) 40 MG tablet Take 1 tablet (40 mg total) by mouth daily at 6 PM. 30 tablet 0  . clonazePAM (KLONOPIN) 0.5 MG tablet TAKE 1 TABLET TWICE DAILY AS NEEDED FOR ANXIETY (Patient taking differently: Take 0.5 mg by mouth 2 (two) times daily as needed for anxiety. ) 90 tablet 1  . Cyanocobalamin (VITAMIN B-12 PO) Take 1 tablet by mouth daily.     . famotidine (PEPCID) 40 MG tablet Take 40 mg by mouth daily.     . fenofibrate 160 MG tablet TAKE 1 TABLET EVERY DAY (Patient taking differently: Take 160 mg by mouth daily. ) 90 tablet 1  . glimepiride (AMARYL) 4 MG tablet Take 2 mg by mouth 2 (two) times daily.   2  . HYDROcodone-acetaminophen (NORCO/VICODIN) 5-325 MG tablet Take 1 tablet by mouth every 6 (six) hours as needed for moderate pain. 45 tablet 0  . Insulin NPH, Human,, Isophane, (HUMULIN N) 100 UNIT/ML Kiwkpen Inject into the skin. Take 36 units am and 6 units pm    . insulin regular (NOVOLIN R) 100 units/mL injection Inject into the skin 3 (three) times daily before meals. 23 unites before meals    . Insulin Syringe-Needle U-100 (INSULIN SYRINGE .5CC/31GX5/16") 31G X 5/16" 0.5 ML MISC 40 units in morning and 18units at night    . lisinopril (PRINIVIL,ZESTRIL) 10 MG tablet Take 10 mg by  mouth daily.     . metoprolol succinate (TOPROL-XL) 50 MG 24 hr tablet TAKE 1 TABLET EVERY DAY.  TAKE  WITH  OR  IMMEDIATELY FOLLOWING A MEAL  (Patient taking differently: Take 50 mg by mouth daily. ) 90 tablet 1  . Nintedanib (OFEV) 100 MG CAPS Take 1 capsule (100 mg total) by mouth 2 (two) times daily after a meal. (  Patient not taking: Reported on 04/24/2019) 60 capsule 5   No current facility-administered medications on file prior to visit.    Cardiovascular and other pertinent studies:  EKG 04/26/2019: Sinus rhythm 81 bpm.  Low voltage in precordial leads.  Nonspecific T-abnormality.    Echocardiogram 03/19/2018: 1. The left ventricle has normal systolic function with an ejection  fraction of 60-65%. The cavity size was normal. Left ventricular diastolic  Doppler parameters are consistent with impaired relaxation.  2. The right ventricle has normal systolic function. The cavity was  normal. There is no increase in right ventricular wall thickness.  3. The mitral valve is normal in structure. No evidence of mitral valve  stenosis.  4. The tricuspid valve is normal in structure.  5. The aortic valve is tricuspid Mild thickening of the aortic valve no  stenosis of the aortic valve.  6. The aortic root is normal in size and structure.  Carotid US 03/19/2018: Right Carotid: Velocities in the right ICA are consistent with a 1-39%  stenosis.  Left Carotid: Velocities in the left ICA are consistent with a 1-39%  stenosis.  Vertebrals: Bilateral vertebral arteries demonstrate antegrade flow.    MRI/MRA brain 03/18/2018: 1. Possible tiny acute infarct in the right occipital cortex. Given the location this could be an artifact however is seen on axial and coronal diffusion imaging suggesting this may be an acute infarct. 2. Moderate chronic microvascular ischemic change in the white matter. 3. Normal intracranial MRA   Recent labs: 03/22/2019: Glucose 396, BUN/Cr 28/1.53. EGFR  32. Na/K 135/4.6. AST/ALT 39/40 H/H 12/36. MCV 101. Platelets 311  02/2018: HbA1C 9.4% Chol 174, TG 312, HDL 29, LDL 83  2018: TSH 3.4 normal    Review of Systems  Cardiovascular: Negative for chest pain, dyspnea on exertion, leg swelling, palpitations and syncope.         Vitals:   04/26/19 1151  BP: (!) 170/73  Pulse: 85  Resp: 19  Temp: (!) 96.7 F (35.9 C)  SpO2: 96%     Body mass index is 28.32 kg/m. Filed Weights   04/26/19 1151  Weight: 145 lb (65.8 kg)     Objective:   Physical Exam  Constitutional: She appears well-developed and well-nourished.  Neck: No JVD present.  Cardiovascular: Normal rate, regular rhythm, normal heart sounds and intact distal pulses.  No murmur heard. Pulmonary/Chest: Effort normal and breath sounds normal. She has no wheezes. She has no rales.  Musculoskeletal:        General: No edema.  Nursing note and vitals reviewed.          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  TIA: To complete her TIA workup, recommend 14 day cardiac telemetry. She is willing to try the monitor. If 14 day monitor does not show Afib, she is not inclined to undergo loop recorder placement.   Hypertension: BP elevated today, usually normal. Defer management to PCP.   Thank you for referring the patient to [place]. Please feel free to contact with any questions.  Nigel Mormon, MD [facility] Pager: [phone] Office: [phone]

## 2019-04-26 ENCOUNTER — Ambulatory Visit: Payer: Medicare HMO | Admitting: Cardiology

## 2019-04-26 ENCOUNTER — Encounter: Payer: Self-pay | Admitting: Cardiology

## 2019-04-26 ENCOUNTER — Other Ambulatory Visit: Payer: Self-pay

## 2019-04-26 VITALS — BP 170/73 | HR 85 | Temp 96.7°F | Resp 19 | Ht 60.0 in | Wt 145.0 lb

## 2019-04-26 DIAGNOSIS — G459 Transient cerebral ischemic attack, unspecified: Secondary | ICD-10-CM

## 2019-04-26 DIAGNOSIS — I10 Essential (primary) hypertension: Secondary | ICD-10-CM

## 2019-04-28 ENCOUNTER — Telehealth: Payer: Self-pay | Admitting: Pulmonary Disease

## 2019-04-28 NOTE — Telephone Encounter (Signed)
Spoke with patient , she is not going to take the 100. Patient did not talk to [facility]. Per last AVS TP states to stay off of Ofev. Patient will follow up per recommendations.  Nothing further needed at this time.

## 2019-05-02 ENCOUNTER — Other Ambulatory Visit: Payer: Self-pay

## 2019-05-02 ENCOUNTER — Ambulatory Visit: Payer: Medicare HMO

## 2019-05-02 DIAGNOSIS — G459 Transient cerebral ischemic attack, unspecified: Secondary | ICD-10-CM

## 2019-05-03 ENCOUNTER — Telehealth: Payer: Self-pay

## 2019-05-03 NOTE — Telephone Encounter (Signed)
Telephone encounter:  Reason for call: pt called to say she cannot wear her monitor any longer, she will be returning it.  She has only worn for 24 hours, can this be made into a 24 holter instead of  ROCT? Usual provider: MP  Last office visit: 04/26/19  Next office visit:    Last hospitalization:    Current Outpatient Medications on File Prior to Visit  Medication Sig Dispense Refill  . ACCU-CHEK FASTCLIX LANCETS MISC     . ACCU-CHEK SMARTVIEW test strip     . aspirin 81 MG tablet Take 81 mg by mouth daily.    Marland Kitchen atorvastatin (LIPITOR) 40 MG tablet Take 1 tablet (40 mg total) by mouth daily at 6 PM. 30 tablet 0  . clonazePAM (KLONOPIN) 0.5 MG tablet TAKE 1 TABLET TWICE DAILY AS NEEDED FOR ANXIETY (Patient taking differently: Take 0.5 mg by mouth 2 (two) times daily as needed for anxiety. ) 90 tablet 1  . Cyanocobalamin (VITAMIN B-12 PO) Take 1 tablet by mouth daily.     . famotidine (PEPCID) 40 MG tablet Take 40 mg by mouth daily.     . fenofibrate 160 MG tablet TAKE 1 TABLET EVERY DAY (Patient taking differently: Take 160 mg by mouth daily. ) 90 tablet 1  . glimepiride (AMARYL) 4 MG tablet Take 2 mg by mouth 2 (two) times daily.   2  . HYDROcodone-acetaminophen (NORCO/VICODIN) 5-325 MG tablet Take 1 tablet by mouth every 6 (six) hours as needed for moderate pain. 45 tablet 0  . Insulin NPH, Human,, Isophane, (HUMULIN N) 100 UNIT/ML Kiwkpen Inject into the skin. Take 36 units am and 6 units pm    . insulin regular (NOVOLIN R) 100 units/mL injection Inject into the skin 3 (three) times daily before meals. 23 unites before meals    . Insulin Syringe-Needle U-100 (INSULIN SYRINGE .5CC/31GX5/16") 31G X 5/16" 0.5 ML MISC 40 units in morning and 18units at night    . lisinopril (PRINIVIL,ZESTRIL) 10 MG tablet Take 10 mg by mouth daily.     . metoprolol succinate (TOPROL-XL) 50 MG 24 hr tablet TAKE 1 TABLET EVERY DAY.  TAKE  WITH  OR  IMMEDIATELY FOLLOWING A MEAL  (Patient taking differently:  Take 50 mg by mouth daily. ) 90 tablet 1  . Nintedanib (OFEV) 100 MG CAPS Take 1 capsule (100 mg total) by mouth 2 (two) times daily after a meal. (Patient not taking: Reported on 04/24/2019) 60 capsule 5   No current facility-administered medications on file prior to visit.

## 2019-05-03 NOTE — Telephone Encounter (Signed)
Yes. Check with Lani. Im kay with 24 hr Holter

## 2019-05-04 ENCOUNTER — Telehealth: Payer: Self-pay | Admitting: Adult Health

## 2019-05-04 NOTE — Telephone Encounter (Signed)
[place] and spoke with [facility] pharmacist stating to her that pt is to remain off of OFEV due to side effects. Megan verbalized understanding and stated they would fix that on their side.nothing further needed.

## 2019-06-06 ENCOUNTER — Encounter (HOSPITAL_COMMUNITY): Payer: Self-pay

## 2019-06-06 ENCOUNTER — Ambulatory Visit (HOSPITAL_COMMUNITY)
Admission: EM | Admit: 2019-06-06 | Discharge: 2019-06-06 | Disposition: A | Discharge status: Home / Self Care | Payer: Medicare HMO | Attending: Urgent Care | Admitting: Urgent Care

## 2019-06-06 ENCOUNTER — Other Ambulatory Visit: Payer: Self-pay

## 2019-06-06 ENCOUNTER — Ambulatory Visit (INDEPENDENT_AMBULATORY_CARE_PROVIDER_SITE_OTHER): Payer: Medicare HMO

## 2019-06-06 DIAGNOSIS — R0781 Pleurodynia: Secondary | ICD-10-CM

## 2019-06-06 DIAGNOSIS — S20221A Contusion of right back wall of thorax, initial encounter: Secondary | ICD-10-CM

## 2019-06-06 DIAGNOSIS — M25551 Pain in right hip: Secondary | ICD-10-CM

## 2019-06-06 DIAGNOSIS — M546 Pain in thoracic spine: Secondary | ICD-10-CM

## 2019-06-06 DIAGNOSIS — W19XXXA Unspecified fall, initial encounter: Secondary | ICD-10-CM

## 2019-06-06 NOTE — ED Triage Notes (Signed)
Pt states she has fell on Sunday and hurt her back. Pt states she stubble and hit her hip on the edge of the porch.

## 2019-06-06 NOTE — Discharge Instructions (Signed)
Please just use Tylenol at a dose of 500mg -650mg  once every 6 hours as needed for your aches, pains, fevers.  Do not use any nonsteroidal anti-inflammatories (NSAIDs) like ibuprofen, Motrin, naproxen, Aleve, etc. which are all available over-the-counter.  Use hydrocodone if your pain persists despite taking Tylenol.

## 2019-06-06 NOTE — ED Provider Notes (Signed)
[place]   MRN: [id number] DOB: 09/19/1933  Subjective:   Sandra [name] is a 86 y.o. female presenting for suffering an accidental fall on her porch and making impact against the post.  Has had persistent right thoracic back pain that radiates to the lateral flank side for the past 2 days.  Patient also hit her hip on the right side.  Has used Tylenol consistently with some relief.  Has chronic right SI joint pain, chronic neck pain, chronic back pain.  Has a prescription for hydrocodone to use for these kinds of pains.  No current facility-administered medications for this encounter.  Current Outpatient Medications:  .  ACCU-CHEK FASTCLIX LANCETS MISC, , Disp: , Rfl:  .  ACCU-CHEK SMARTVIEW test strip, , Disp: , Rfl:  .  aspirin 81 MG tablet, Take 81 mg by mouth daily., Disp: , Rfl:  .  atorvastatin (LIPITOR) 40 MG tablet, Take 1 tablet (40 mg total) by mouth daily at 6 PM., Disp: 30 tablet, Rfl: 0 .  clonazePAM (KLONOPIN) 0.5 MG tablet, TAKE 1 TABLET TWICE DAILY AS NEEDED FOR ANXIETY (Patient taking differently: Take 0.5 mg by mouth 2 (two) times daily as needed for anxiety. ), Disp: 90 tablet, Rfl: 1 .  Cyanocobalamin (VITAMIN B-12 PO), Take 1 tablet by mouth daily. , Disp: , Rfl:  .  famotidine (PEPCID) 40 MG tablet, Take 40 mg by mouth daily. , Disp: , Rfl:  .  fenofibrate 160 MG tablet, TAKE 1 TABLET EVERY DAY (Patient taking differently: Take 160 mg by mouth daily. ), Disp: 90 tablet, Rfl: 1 .  glimepiride (AMARYL) 4 MG tablet, Take 2 mg by mouth 2 (two) times daily. , Disp: , Rfl: 2 .  HYDROcodone-acetaminophen (NORCO/VICODIN) 5-325 MG tablet, Take 1 tablet by mouth every 6 (six) hours as needed for moderate pain., Disp: 45 tablet, Rfl: 0 .  Insulin NPH, Human,, Isophane, (HUMULIN N) 100 UNIT/ML Kiwkpen, Inject into the skin. Take 36 units am and 6 units pm, Disp: , Rfl:  .  insulin regular (NOVOLIN R) 100 units/mL injection, Inject into the skin 3 (three) times daily  before meals. 23 unites before meals, Disp: , Rfl:  .  Insulin Syringe-Needle U-100 (INSULIN SYRINGE .5CC/31GX5/16") 31G X 5/16" 0.5 ML MISC, 40 units in morning and 18units at night, Disp: , Rfl:  .  lisinopril (PRINIVIL,ZESTRIL) 10 MG tablet, Take 10 mg by mouth daily. , Disp: , Rfl:  .  metoprolol succinate (TOPROL-XL) 50 MG 24 hr tablet, TAKE 1 TABLET EVERY DAY.  TAKE  WITH  OR  IMMEDIATELY FOLLOWING A MEAL  (Patient taking differently: Take 50 mg by mouth daily. ), Disp: 90 tablet, Rfl: 1 .  Nintedanib (OFEV) 100 MG CAPS, Take 1 capsule (100 mg total) by mouth 2 (two) times daily after a meal. (Patient not taking: Reported on 04/24/2019), Disp: 60 capsule, Rfl: 5   Allergies  Allergen Reactions  . Cephalexin Nausea And Vomiting    Pt stated made severely sick, will never take again  . Pioglitazone Other (See Comments)    REACTION: EDEMA    Past Medical History:  Diagnosis Date  . Abnormal blood finding 02/10/2016  . Abrasion of ear canal 09/28/2012  . Achilles tendon injury 11/27/2010  . ACID REFLUX DISEASE 05/12/2006   Qualifier: Diagnosis of  By: Larose Kells MD, [place] Acute MI ([place])   . ALLERGIC RHINITIS 05/12/2006   Qualifier: Diagnosis of  By: Larose Kells MD, [place]  Anxiety state 07/03/2009   Qualifier: Diagnosis of  By: Larose Kells MD, [place] pain 07/01/2010  . Chronic kidney disease (CKD), stage III (moderate) ([place]) 05/31/2014  . Chronic right SI joint pain 09/22/2013  . DEGENERATIVE JOINT DISEASE, CERVICAL SPINE 06/23/2006   Annotation: had a CAT scan with a cervical myelogram that show  left C6-7 and  C5-6 foraminal stenosis Qualifier: Diagnosis of  By: Larose Kells MD, [place] DEPRESSION 05/12/2006   Qualifier: Diagnosis of  By: Larose Kells MD, [place] DIABETES MELLITUS, TYPE II 05/12/2006   Now following w/ Endo at [place] 04/27/2007   Qualifier: Diagnosis of  By: Jerold Coombe    . Dyspnea 02/10/2016  . Encephalopathy, hypertensive   . Essential hypertension  05/12/2006   Qualifier: Diagnosis of  By: Larose Kells MD, [place] Fatigue 05/31/2014  . GAIT DISTURBANCE 08/07/2009   Qualifier: Diagnosis of  By: Larose Kells MD, [place] GERD (gastroesophageal reflux disease)   . Headache(784.0) 11/27/2010  . Hyperlipidemia   . Hyperlipidemia associated with type 2 diabetes mellitus ([place]) 05/12/2006   Qualifier: Diagnosis of  By: Larose Kells MD, [place] ILD (interstitial lung disease) ([place]) 05/31/2015  . Leukocytopenia 05/31/2014  . Lower abdominal pain 05/17/2014  . Nausea with vomiting 09/22/2013  . NECK PAIN, CHRONIC 04/03/2008   Qualifier: Diagnosis of  By: Larose Kells MD, [place] Nodule on liver 12/06/2015  . Osteoarthritis 02/10/2016  . Osteopenia   . Osteoporosis 06/23/2006   Annotation: had a bone density test in 08-2004.  T score was -2.4 Qualifier: Diagnosis of  By: Larose Kells MD, [place] SAH (subarachnoid hemorrhage) ([place])   . Sciatica   . Scleroderma ([place]) 02/28/2016  . Severe hypertension 03/18/2018  . Slurred speech 11/27/2010  . Takotsubo cardiomyopathy    s/p stress MI with stress induced CM with normal coronary arteries by cath 2007 with normalization of LVF by echo 04/2005  . TIA (transient ischemic attack)   . Unspecified cerebral artery occlusion with cerebral infarction 04/07/2012  . UTI (urinary tract infection) 11/02/2011  . 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 Vitamin D deficiency 10/01/2014     Past Surgical History:  Procedure Laterality Date  . ABDOMINAL HYSTERECTOMY  1977   no oophorectomy  . APPENDECTOMY    . CARDIAC CATHETERIZATION    . CATARACT EXTRACTION, BILATERAL  2011  . SPINAL FUSION  2000   Dr. Vertell Limber  . TONSILLECTOMY      Family History  Problem Relation Age of Onset  . Intracerebral hemorrhage Daughter        assoc with post-partum  . Stroke Mother   . Diabetes Sister   . Asthma Sister   . Diabetes Sister   . Breast cancer Other        ?Aunt  . Cancer Other        breast?  . Coronary artery disease Neg Hx     Social History    Tobacco Use  . Smoking status: Passive Smoke Exposure - Never Smoker  . Smokeless tobacco: Never Used  . Tobacco comment: Mother & Children  Substance Use Topics  . Alcohol use: Yes    Alcohol/week: 1.0 standard drinks    Types: 1 Glasses of wine per week    Comment: occ  . Drug use: No    ROS   Objective:  Vitals: BP (!) 143/76 (BP Location: Right Arm)   Pulse 95   Temp 98.2 F (36.8 C) (Oral)   Resp 18   Wt 148 lb (67.1 kg)   SpO2 99%   BMI 28.90 kg/m   Physical Exam Constitutional:      General: She is not in acute distress.    Appearance: Normal appearance. She is well-developed. She is not ill-appearing, toxic-appearing or diaphoretic.  HENT:     Head: Normocephalic and atraumatic.     Nose: Nose normal.     Mouth/Throat:     Mouth: Mucous membranes are moist.  Eyes:     Extraocular Movements: Extraocular movements intact.     Pupils: Pupils are equal, round, and reactive to light.  Cardiovascular:     Rate and Rhythm: Normal rate and regular rhythm.     Pulses: Normal pulses.     Heart sounds: Normal heart sounds. No murmur. No friction rub. No gallop.   Pulmonary:     Effort: Pulmonary effort is normal. No respiratory distress.     Breath sounds: Normal breath sounds. No stridor. No wheezing, rhonchi or rales.  Musculoskeletal:       Arms:       Legs:     Comments: Ambulates without assistance at her normal pace.  Skin:    General: Skin is warm and dry.     Findings: No rash.  Neurological:     Mental Status: She is alert and oriented to person, place, and time.  Psychiatric:        Mood and Affect: Mood normal.        Behavior: Behavior normal.        Thought Content: Thought content normal.        Judgment: Judgment normal.     DG Ribs Unilateral W/Chest Right  Result Date: 06/06/2019 CLINICAL DATA:  Right posterior rib pain after fall 2 days ago. EXAM: RIGHT RIBS AND CHEST - 3+ VIEW COMPARISON:  Chest radiograph 11/15/2017.  High-resolution chest CT 10/19/2018 FINDINGS: No evidence of acute rib fracture, the oblique view is limited by overlapping osseous and soft tissue structures. There is no evidence of pneumothorax or pleural effusion. Unchanged heart size and mediastinal contours. Interstitial coarsening previously characterized on chest CT. No focal pulmonary opacity. IMPRESSION: No evidence of acute rib fracture, oblique view is limited by overlapping osseous and soft tissue structures. No pulmonary complication such as pneumothorax. Electronically Signed   By: Keith Rake M.D.   On: 06/06/2019 17:10   Assessment and Plan :   PDMP not reviewed this encounter.  1. Acute right-sided thoracic back pain   2. Fall, initial encounter   3. Rib pain on right side   4. Right hip pain   5. Contusion of right back wall of thorax, initial encounter     Recommended conservative management for contusion with scheduled Tylenol.  Use her regular hydrocodone for breakthrough pain.  Lung sounds are clear.  Recommended close follow-up with her PCP, chronic pain provider. Counseled patient on potential for adverse effects with medications prescribed/recommended today, ER and return-to-clinic precautions discussed, patient verbalized understanding.    Jaynee Eagles, [place] 06/06/19 1738

## 2019-06-17 ENCOUNTER — Encounter (HOSPITAL_COMMUNITY): Payer: Self-pay | Admitting: *Deleted

## 2019-06-17 ENCOUNTER — Other Ambulatory Visit: Payer: Self-pay

## 2019-06-17 ENCOUNTER — Emergency Department (HOSPITAL_COMMUNITY)
Admission: EM | Admit: 2019-06-17 | Discharge: 2019-06-18 | Discharge status: Against Medical Advice | Payer: Medicare HMO | Source: Home / Self Care

## 2019-06-17 DIAGNOSIS — I619 Nontraumatic intracerebral hemorrhage, unspecified: Secondary | ICD-10-CM | POA: Diagnosis not present

## 2019-06-17 DIAGNOSIS — R519 Headache, unspecified: Secondary | ICD-10-CM | POA: Diagnosis not present

## 2019-06-17 LAB — BASIC METABOLIC PANEL
Anion gap: 17 — ABNORMAL HIGH (ref 5–15)
BUN: 34 mg/dL — ABNORMAL HIGH (ref 8–23)
CO2: 22 mmol/L (ref 22–32)
Calcium: 9.7 mg/dL (ref 8.9–10.3)
Chloride: 104 mmol/L (ref 98–111)
Creatinine, Ser: 1.69 mg/dL — ABNORMAL HIGH (ref 0.44–1.00)
GFR calc Af Amer: 32 mL/min — ABNORMAL LOW (ref >60)
GFR calc non Af Amer: 28 mL/min — ABNORMAL LOW (ref >60)
Glucose, Bld: 253 mg/dL — ABNORMAL HIGH (ref 70–99)
Potassium: 4.7 mmol/L (ref 3.5–5.1)
Sodium: 143 mmol/L (ref 135–145)

## 2019-06-17 LAB — CBC
HCT: 37 % (ref 36.0–46.0)
Hemoglobin: 12.1 g/dL (ref 12.0–15.0)
MCH: 33.4 pg (ref 26.0–34.0)
MCHC: 32.7 g/dL (ref 30.0–36.0)
MCV: 102.2 fL — ABNORMAL HIGH (ref 80.0–100.0)
Platelets: 310 10*3/uL (ref 150–400)
RBC: 3.62 MIL/uL — ABNORMAL LOW (ref 3.87–5.11)
RDW: 11.7 % (ref 11.5–15.5)
WBC: 8.5 10*3/uL (ref 4.0–10.5)
nRBC: 0 % (ref 0.0–0.2)

## 2019-06-17 LAB — TROPONIN I (HIGH SENSITIVITY): Troponin I (High Sensitivity): 16 ng/L (ref <18)

## 2019-06-17 MED ORDER — SODIUM CHLORIDE 0.9% FLUSH: 3.0000 mL | Freq: Once | INTRAVENOUS | Status: DC

## 2019-06-17 NOTE — ED Triage Notes (Signed)
The pt is very nervous and jittery  She reports that she has anxiety and usually takes medicine for it  Hyperventilating  Both pupils appear equal and reactive

## 2019-06-17 NOTE — ED Triage Notes (Signed)
The pt has had a rt sided headache all day  She reports that she woke up in the floor this am  She does not know how she got on the floor  Her rt elbow is bruised and swollen also  Very nervous and jittery   Her granddaughter is staying with her tonight

## 2019-06-18 ENCOUNTER — Inpatient Hospital Stay (HOSPITAL_COMMUNITY): Payer: Medicare HMO

## 2019-06-18 ENCOUNTER — Emergency Department (HOSPITAL_COMMUNITY): Payer: Medicare HMO

## 2019-06-18 ENCOUNTER — Inpatient Hospital Stay (HOSPITAL_COMMUNITY)
Admission: EM | Admit: 2019-06-18 | Discharge: 2019-06-22 | DRG: 065 | Disposition: A | Discharge status: Rehabilitation Facility | Payer: Medicare HMO | Attending: Neurology | Admitting: Neurology

## 2019-06-18 ENCOUNTER — Encounter (HOSPITAL_COMMUNITY): Payer: Self-pay | Admitting: Emergency Medicine

## 2019-06-18 DIAGNOSIS — R509 Fever, unspecified: Secondary | ICD-10-CM | POA: Diagnosis not present

## 2019-06-18 DIAGNOSIS — K219 Gastro-esophageal reflux disease without esophagitis: Secondary | ICD-10-CM | POA: Diagnosis present

## 2019-06-18 DIAGNOSIS — Z90710 Acquired absence of both cervix and uterus: Secondary | ICD-10-CM

## 2019-06-18 DIAGNOSIS — E785 Hyperlipidemia, unspecified: Secondary | ICD-10-CM | POA: Diagnosis present

## 2019-06-18 DIAGNOSIS — E1151 Type 2 diabetes mellitus with diabetic peripheral angiopathy without gangrene: Secondary | ICD-10-CM | POA: Diagnosis present

## 2019-06-18 DIAGNOSIS — D7589 Other specified diseases of blood and blood-forming organs: Secondary | ICD-10-CM | POA: Diagnosis not present

## 2019-06-18 DIAGNOSIS — D62 Acute posthemorrhagic anemia: Secondary | ICD-10-CM | POA: Diagnosis present

## 2019-06-18 DIAGNOSIS — I5181 Takotsubo syndrome: Secondary | ICD-10-CM | POA: Diagnosis present

## 2019-06-18 DIAGNOSIS — I639 Cerebral infarction, unspecified: Secondary | ICD-10-CM | POA: Diagnosis not present

## 2019-06-18 DIAGNOSIS — E669 Obesity, unspecified: Secondary | ICD-10-CM | POA: Diagnosis not present

## 2019-06-18 DIAGNOSIS — Z7982 Long term (current) use of aspirin: Secondary | ICD-10-CM

## 2019-06-18 DIAGNOSIS — E876 Hypokalemia: Secondary | ICD-10-CM | POA: Diagnosis not present

## 2019-06-18 DIAGNOSIS — R4189 Other symptoms and signs involving cognitive functions and awareness: Secondary | ICD-10-CM | POA: Diagnosis present

## 2019-06-18 DIAGNOSIS — I358 Other nonrheumatic aortic valve disorders: Secondary | ICD-10-CM | POA: Diagnosis present

## 2019-06-18 DIAGNOSIS — R414 Neurologic neglect syndrome: Secondary | ICD-10-CM | POA: Diagnosis present

## 2019-06-18 DIAGNOSIS — E11319 Type 2 diabetes mellitus with unspecified diabetic retinopathy without macular edema: Secondary | ICD-10-CM | POA: Diagnosis not present

## 2019-06-18 DIAGNOSIS — I10 Essential (primary) hypertension: Secondary | ICD-10-CM | POA: Diagnosis not present

## 2019-06-18 DIAGNOSIS — Z9842 Cataract extraction status, left eye: Secondary | ICD-10-CM

## 2019-06-18 DIAGNOSIS — F329 Major depressive disorder, single episode, unspecified: Secondary | ICD-10-CM | POA: Diagnosis present

## 2019-06-18 DIAGNOSIS — W06XXXA Fall from bed, initial encounter: Secondary | ICD-10-CM | POA: Diagnosis present

## 2019-06-18 DIAGNOSIS — I680 Cerebral amyloid angiopathy: Secondary | ICD-10-CM | POA: Diagnosis present

## 2019-06-18 DIAGNOSIS — J849 Interstitial pulmonary disease, unspecified: Secondary | ICD-10-CM | POA: Diagnosis present

## 2019-06-18 DIAGNOSIS — Z881 Allergy status to other antibiotic agents status: Secondary | ICD-10-CM

## 2019-06-18 DIAGNOSIS — N390 Urinary tract infection, site not specified: Secondary | ICD-10-CM | POA: Diagnosis present

## 2019-06-18 DIAGNOSIS — I161 Hypertensive emergency: Secondary | ICD-10-CM | POA: Diagnosis not present

## 2019-06-18 DIAGNOSIS — I129 Hypertensive chronic kidney disease with stage 1 through stage 4 chronic kidney disease, or unspecified chronic kidney disease: Secondary | ICD-10-CM | POA: Diagnosis present

## 2019-06-18 DIAGNOSIS — E1169 Type 2 diabetes mellitus with other specified complication: Secondary | ICD-10-CM | POA: Diagnosis present

## 2019-06-18 DIAGNOSIS — E7800 Pure hypercholesterolemia, unspecified: Secondary | ICD-10-CM | POA: Diagnosis not present

## 2019-06-18 DIAGNOSIS — E1159 Type 2 diabetes mellitus with other circulatory complications: Secondary | ICD-10-CM | POA: Diagnosis not present

## 2019-06-18 DIAGNOSIS — G936 Cerebral edema: Secondary | ICD-10-CM | POA: Diagnosis present

## 2019-06-18 DIAGNOSIS — E1165 Type 2 diabetes mellitus with hyperglycemia: Secondary | ICD-10-CM | POA: Diagnosis present

## 2019-06-18 DIAGNOSIS — Z981 Arthrodesis status: Secondary | ICD-10-CM

## 2019-06-18 DIAGNOSIS — E1122 Type 2 diabetes mellitus with diabetic chronic kidney disease: Secondary | ICD-10-CM | POA: Diagnosis present

## 2019-06-18 DIAGNOSIS — I611 Nontraumatic intracerebral hemorrhage in hemisphere, cortical: Secondary | ICD-10-CM

## 2019-06-18 DIAGNOSIS — E46 Unspecified protein-calorie malnutrition: Secondary | ICD-10-CM | POA: Diagnosis not present

## 2019-06-18 DIAGNOSIS — Z8679 Personal history of other diseases of the circulatory system: Secondary | ICD-10-CM | POA: Diagnosis not present

## 2019-06-18 DIAGNOSIS — Z20822 Contact with and (suspected) exposure to covid-19: Secondary | ICD-10-CM | POA: Diagnosis present

## 2019-06-18 DIAGNOSIS — N1831 Chronic kidney disease, stage 3a: Secondary | ICD-10-CM | POA: Diagnosis not present

## 2019-06-18 DIAGNOSIS — Z8673 Personal history of transient ischemic attack (TIA), and cerebral infarction without residual deficits: Secondary | ICD-10-CM

## 2019-06-18 DIAGNOSIS — Z823 Family history of stroke: Secondary | ICD-10-CM

## 2019-06-18 DIAGNOSIS — G8194 Hemiplegia, unspecified affecting left nondominant side: Secondary | ICD-10-CM | POA: Diagnosis present

## 2019-06-18 DIAGNOSIS — Z825 Family history of asthma and other chronic lower respiratory diseases: Secondary | ICD-10-CM

## 2019-06-18 DIAGNOSIS — Z794 Long term (current) use of insulin: Secondary | ICD-10-CM

## 2019-06-18 DIAGNOSIS — Z79899 Other long term (current) drug therapy: Secondary | ICD-10-CM

## 2019-06-18 DIAGNOSIS — N183 Chronic kidney disease, stage 3 unspecified: Secondary | ICD-10-CM | POA: Diagnosis present

## 2019-06-18 DIAGNOSIS — I2510 Atherosclerotic heart disease of native coronary artery without angina pectoris: Secondary | ICD-10-CM | POA: Diagnosis present

## 2019-06-18 DIAGNOSIS — E854 Organ-limited amyloidosis: Secondary | ICD-10-CM | POA: Diagnosis present

## 2019-06-18 DIAGNOSIS — I69154 Hemiplegia and hemiparesis following nontraumatic intracerebral hemorrhage affecting left non-dominant side: Secondary | ICD-10-CM | POA: Diagnosis not present

## 2019-06-18 DIAGNOSIS — M545 Low back pain: Secondary | ICD-10-CM | POA: Diagnosis present

## 2019-06-18 DIAGNOSIS — G8929 Other chronic pain: Secondary | ICD-10-CM | POA: Diagnosis present

## 2019-06-18 DIAGNOSIS — I6389 Other cerebral infarction: Secondary | ICD-10-CM | POA: Diagnosis not present

## 2019-06-18 DIAGNOSIS — Z888 Allergy status to other drugs, medicaments and biological substances status: Secondary | ICD-10-CM

## 2019-06-18 DIAGNOSIS — M349 Systemic sclerosis, unspecified: Secondary | ICD-10-CM | POA: Diagnosis present

## 2019-06-18 DIAGNOSIS — Z9841 Cataract extraction status, right eye: Secondary | ICD-10-CM

## 2019-06-18 DIAGNOSIS — R519 Headache, unspecified: Secondary | ICD-10-CM | POA: Diagnosis present

## 2019-06-18 DIAGNOSIS — H5347 Heteronymous bilateral field defects: Secondary | ICD-10-CM | POA: Diagnosis present

## 2019-06-18 DIAGNOSIS — N1832 Chronic kidney disease, stage 3b: Secondary | ICD-10-CM | POA: Diagnosis not present

## 2019-06-18 DIAGNOSIS — I619 Nontraumatic intracerebral hemorrhage, unspecified: Secondary | ICD-10-CM | POA: Diagnosis present

## 2019-06-18 DIAGNOSIS — N179 Acute kidney failure, unspecified: Secondary | ICD-10-CM | POA: Diagnosis not present

## 2019-06-18 DIAGNOSIS — R0989 Other specified symptoms and signs involving the circulatory and respiratory systems: Secondary | ICD-10-CM | POA: Diagnosis not present

## 2019-06-18 DIAGNOSIS — E8809 Other disorders of plasma-protein metabolism, not elsewhere classified: Secondary | ICD-10-CM | POA: Diagnosis not present

## 2019-06-18 DIAGNOSIS — F411 Generalized anxiety disorder: Secondary | ICD-10-CM | POA: Diagnosis present

## 2019-06-18 DIAGNOSIS — Z833 Family history of diabetes mellitus: Secondary | ICD-10-CM

## 2019-06-18 DIAGNOSIS — M810 Age-related osteoporosis without current pathological fracture: Secondary | ICD-10-CM | POA: Diagnosis present

## 2019-06-18 DIAGNOSIS — E559 Vitamin D deficiency, unspecified: Secondary | ICD-10-CM | POA: Diagnosis present

## 2019-06-18 LAB — CBC WITH DIFFERENTIAL/PLATELET
Abs Immature Granulocytes: 0.04 10*3/uL (ref 0.00–0.07)
Basophils Absolute: 0 10*3/uL (ref 0.0–0.1)
Basophils Relative: 0 %
Eosinophils Absolute: 0.1 10*3/uL (ref 0.0–0.5)
Eosinophils Relative: 1 %
HCT: 39.4 % (ref 36.0–46.0)
Hemoglobin: 12.7 g/dL (ref 12.0–15.0)
Immature Granulocytes: 1 %
Lymphocytes Relative: 20 %
Lymphs Abs: 1.7 10*3/uL (ref 0.7–4.0)
MCH: 33.2 pg (ref 26.0–34.0)
MCHC: 32.2 g/dL (ref 30.0–36.0)
MCV: 103.1 fL — ABNORMAL HIGH (ref 80.0–100.0)
Monocytes Absolute: 0.5 10*3/uL (ref 0.1–1.0)
Monocytes Relative: 6 %
Neutro Abs: 6.2 10*3/uL (ref 1.7–7.7)
Neutrophils Relative %: 72 %
Platelets: 259 10*3/uL (ref 150–400)
RBC: 3.82 MIL/uL — ABNORMAL LOW (ref 3.87–5.11)
RDW: 11.6 % (ref 11.5–15.5)
WBC: 8.5 10*3/uL (ref 4.0–10.5)
nRBC: 0 % (ref 0.0–0.2)

## 2019-06-18 LAB — BASIC METABOLIC PANEL
Anion gap: 13 (ref 5–15)
BUN: 28 mg/dL — ABNORMAL HIGH (ref 8–23)
CO2: 18 mmol/L — ABNORMAL LOW (ref 22–32)
Calcium: 9.3 mg/dL (ref 8.9–10.3)
Chloride: 108 mmol/L (ref 98–111)
Creatinine, Ser: 1.6 mg/dL — ABNORMAL HIGH (ref 0.44–1.00)
GFR calc Af Amer: 34 mL/min — ABNORMAL LOW (ref >60)
GFR calc non Af Amer: 29 mL/min — ABNORMAL LOW (ref >60)
Glucose, Bld: 235 mg/dL — ABNORMAL HIGH (ref 70–99)
Potassium: 5 mmol/L (ref 3.5–5.1)
Sodium: 139 mmol/L (ref 135–145)

## 2019-06-18 LAB — HEMOGLOBIN A1C
Hgb A1c MFr Bld: 8.6 % — ABNORMAL HIGH (ref 4.8–5.6)
Mean Plasma Glucose: 200.12 mg/dL

## 2019-06-18 LAB — MRSA PCR SCREENING: MRSA by PCR: NEGATIVE

## 2019-06-18 LAB — GLUCOSE, CAPILLARY: Glucose-Capillary: 145 mg/dL — ABNORMAL HIGH (ref 70–99)

## 2019-06-18 LAB — SARS CORONAVIRUS 2 BY RT PCR (HOSPITAL ORDER, PERFORMED IN ~~LOC~~ HOSPITAL LAB): SARS Coronavirus 2: NEGATIVE

## 2019-06-18 LAB — TROPONIN I (HIGH SENSITIVITY): Troponin I (High Sensitivity): 18 ng/L — ABNORMAL HIGH (ref <18)

## 2019-06-18 LAB — SEDIMENTATION RATE: Sed Rate: 15 mm/hr (ref 0–22)

## 2019-06-18 MED ORDER — ACETAMINOPHEN 325 MG PO TABS
650.0000 mg | ORAL_TABLET | Freq: Four times a day (QID) | ORAL | Status: DC | PRN
  Administered 2019-06-18: 650 mg via ORAL
  Filled 2019-06-18: qty 2

## 2019-06-18 MED ORDER — SODIUM CHLORIDE 0.9 % IV SOLN: INTRAVENOUS | Status: DC

## 2019-06-18 MED ORDER — ACETAMINOPHEN 325 MG PO TABS
650.0000 mg | ORAL_TABLET | ORAL | Status: DC | PRN
  Administered 2019-06-18 – 2019-06-22 (×7): 650 mg via ORAL
  Filled 2019-06-18 (×9): qty 2

## 2019-06-18 MED ORDER — PANTOPRAZOLE SODIUM 40 MG IV SOLR
40.0000 mg | Freq: Every day | INTRAVENOUS | Status: DC
  Administered 2019-06-18: 40 mg via INTRAVENOUS
  Filled 2019-06-18: qty 40

## 2019-06-18 MED ORDER — ACETAMINOPHEN 650 MG RE SUPP: 650.0000 mg | RECTAL | Status: DC | PRN

## 2019-06-18 MED ORDER — INSULIN ASPART 100 UNIT/ML ~~LOC~~ SOLN
0.0000 [IU] | Freq: Three times a day (TID) | SUBCUTANEOUS | Status: DC
  Administered 2019-06-19: 3 [IU] via SUBCUTANEOUS
  Administered 2019-06-19: 5 [IU] via SUBCUTANEOUS
  Administered 2019-06-19 – 2019-06-20 (×3): 3 [IU] via SUBCUTANEOUS
  Administered 2019-06-20 – 2019-06-22 (×5): 5 [IU] via SUBCUTANEOUS
  Administered 2019-06-22: 3 [IU] via SUBCUTANEOUS
  Administered 2019-06-22: 5 [IU] via SUBCUTANEOUS

## 2019-06-18 MED ORDER — DIPHENHYDRAMINE HCL 50 MG/ML IJ SOLN
12.5000 mg | Freq: Once | INTRAMUSCULAR | Status: AC
  Administered 2019-06-18: 12.5 mg via INTRAVENOUS
  Filled 2019-06-18: qty 1

## 2019-06-18 MED ORDER — ACETAMINOPHEN 160 MG/5ML PO SOLN: 650.0000 mg | ORAL | Status: DC | PRN

## 2019-06-18 MED ORDER — LABETALOL HCL 5 MG/ML IV SOLN: 20.0000 mg | Freq: Once | INTRAVENOUS | Status: DC

## 2019-06-18 MED ORDER — CHLORHEXIDINE GLUCONATE CLOTH 2 % EX PADS
6.0000 | MEDICATED_PAD | Freq: Every day | CUTANEOUS | Status: DC
  Administered 2019-06-18 – 2019-06-22 (×5): 6 via TOPICAL

## 2019-06-18 MED ORDER — MORPHINE SULFATE (PF) 4 MG/ML IV SOLN
4.0000 mg | Freq: Once | INTRAVENOUS | Status: DC
  Filled 2019-06-18: qty 1

## 2019-06-18 MED ORDER — TRAMADOL HCL 50 MG PO TABS
50.0000 mg | ORAL_TABLET | Freq: Once | ORAL | Status: AC
  Administered 2019-06-18: 50 mg via ORAL
  Filled 2019-06-18: qty 1

## 2019-06-18 MED ORDER — STROKE: EARLY STAGES OF RECOVERY BOOK
Freq: Once | Status: AC
  Administered 2019-06-19: 1
  Filled 2019-06-18: qty 1

## 2019-06-18 MED ORDER — LORAZEPAM 2 MG/ML IJ SOLN
0.5000 mg | Freq: Once | INTRAMUSCULAR | Status: AC
  Administered 2019-06-18: 0.5 mg via INTRAVENOUS
  Filled 2019-06-18: qty 1

## 2019-06-18 MED ORDER — CLEVIDIPINE BUTYRATE 0.5 MG/ML IV EMUL
0.0000 mg/h | INTRAVENOUS | Status: DC
  Administered 2019-06-18: 16 mg/h via INTRAVENOUS
  Administered 2019-06-18: 1 mg/h via INTRAVENOUS
  Administered 2019-06-18: 16 mg/h via INTRAVENOUS
  Administered 2019-06-18: 5 mg/h via INTRAVENOUS
  Administered 2019-06-19: 4 mg/h via INTRAVENOUS
  Administered 2019-06-19: 6 mg/h via INTRAVENOUS
  Filled 2019-06-18 (×7): qty 50

## 2019-06-18 MED ORDER — METOCLOPRAMIDE HCL 5 MG/ML IJ SOLN
5.0000 mg | Freq: Once | INTRAMUSCULAR | Status: AC
  Administered 2019-06-18: 5 mg via INTRAVENOUS
  Filled 2019-06-18: qty 2

## 2019-06-18 MED ORDER — SENNOSIDES-DOCUSATE SODIUM 8.6-50 MG PO TABS
1.0000 | ORAL_TABLET | Freq: Two times a day (BID) | ORAL | Status: DC
  Administered 2019-06-19 – 2019-06-22 (×6): 1 via ORAL
  Filled 2019-06-18 (×6): qty 1

## 2019-06-18 NOTE — ED Notes (Signed)
Patient transported to MRI 

## 2019-06-18 NOTE — ED Notes (Signed)
hospitalist NP and Neurology at bedside.

## 2019-06-18 NOTE — ED Notes (Signed)
Attempted to get labs from patient without success, phlebotomy to stick patient for blood work.

## 2019-06-18 NOTE — ED Notes (Signed)
Voicemail left with daughter [name] - regarding bed assignment

## 2019-06-18 NOTE — ED Triage Notes (Addendum)
Patient arrives with [facility] with complaint of severe headache and nausea that started yesterday. Patient found on the floor this morning by granddaughter who lives with patient, patient last seen last night before going to sleep. Patient complained of severe headache and pain behind right eye yesterday, came to ED for evaluation, was triaged but left without being seen.   Patient states she does not remember falling, states last thing she remember is going to sleep in her bed and then she woke up on the floor and was very cold. Oral temp 97.3. Alert and oriented. Left arm drift and weak grip. Last known normal yesterday.  Patient also complaints of right shoulder and rib pain from the fall.

## 2019-06-18 NOTE — ED Notes (Signed)
Called for vitals recheck x1 with no answer. Sandra [name] was upset earlier about wait time and triage RN assured family we were working to get patient next room.

## 2019-06-18 NOTE — ED Notes (Signed)
ED Provider at bedside. 

## 2019-06-18 NOTE — ED Provider Notes (Signed)
[facility] EMERGENCY DEPARTMENT Provider Note   CSN: [id number] Arrival date & time: 06/18/19  1059     History Chief Complaint  Patient presents with  . Headache    Sandra [name] is a 86 y.o. female.  HPI    86 year old female with headache.  Pain it primarily behind the right eye.  She woke up on the floor yesterday morning and she is not sure how she ended up there.  Noticed a headache at that time and it has been persistent since then.  Her grandson is at bedside and is providing additional history.  Patient seems to be she is agreeable glucose before.  Seems confused at times what he describes as distractibility and difficulty concentrating.  She seemed off balance when she walks.  The initially brought her to the emergency room last night but then left because of an extended wait.  Symptoms persisted through this morning so they brought her back.  Past Medical History:  Diagnosis Date  . Abnormal blood finding 02/10/2016  . Abrasion of ear canal 09/28/2012  . Achilles tendon injury 11/27/2010  . ACID REFLUX DISEASE 05/12/2006   Qualifier: Diagnosis of  By: Larose Kells MD, [place] Acute MI ([place])   . ALLERGIC RHINITIS 05/12/2006   Qualifier: Diagnosis of  By: Larose Kells MD, [place] Anxiety state 07/03/2009   Qualifier: Diagnosis of  By: Larose Kells MD, [place] pain 07/01/2010  . Chronic kidney disease (CKD), stage III (moderate) ([place]) 05/31/2014  . Chronic right SI joint pain 09/22/2013  . DEGENERATIVE JOINT DISEASE, CERVICAL SPINE 06/23/2006   Annotation: had a CAT scan with a cervical myelogram that show  left C6-7 and  C5-6 foraminal stenosis Qualifier: Diagnosis of  By: Larose Kells MD, [place] DEPRESSION 05/12/2006   Qualifier: Diagnosis of  By: Larose Kells MD, [place] DIABETES MELLITUS, TYPE II 05/12/2006   Now following w/ Endo at [facility] [place] 04/27/2007   Qualifier: Diagnosis of  By: Jerold Coombe    . Dyspnea 02/10/2016  . Encephalopathy,  hypertensive   . Essential hypertension 05/12/2006   Qualifier: Diagnosis of  By: Larose Kells MD, [place] Fatigue 05/31/2014  . GAIT DISTURBANCE 08/07/2009   Qualifier: Diagnosis of  By: Larose Kells MD, [place] GERD (gastroesophageal reflux disease)   . Headache(784.0) 11/27/2010  . Hyperlipidemia   . Hyperlipidemia associated with type 2 diabetes mellitus ([place]) 05/12/2006   Qualifier: Diagnosis of  By: Larose Kells MD, [place] ILD (interstitial lung disease) ([place]) 05/31/2015  . Leukocytopenia 05/31/2014  . Lower abdominal pain 05/17/2014  . Nausea with vomiting 09/22/2013  . NECK PAIN, CHRONIC 04/03/2008   Qualifier: Diagnosis of  By: Larose Kells MD, [place] Nodule on liver 12/06/2015  . Osteoarthritis 02/10/2016  . Osteopenia   . Osteoporosis 06/23/2006   Annotation: had a bone density test in 08-2004.  T score was -2.4 Qualifier: Diagnosis of  By: Larose Kells MD, [place] SAH (subarachnoid hemorrhage) ([place])   . Sciatica   . Scleroderma ([place]) 02/28/2016  . Severe hypertension 03/18/2018  . Slurred speech 11/27/2010  . Takotsubo cardiomyopathy    s/p stress MI with stress induced CM with normal coronary arteries by cath 2007 with normalization of LVF by echo 04/2005  . TIA (transient ischemic attack)   . Unspecified cerebral  artery occlusion with cerebral infarction 04/07/2012  . UTI (urinary tract infection) 11/02/2011  . 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 Vitamin D deficiency 10/01/2014    Patient Active Problem List   Diagnosis Date Noted  . TIA (transient ischemic attack) 04/26/2019  . Diarrhea 04/24/2019  . Severe hypertension 03/18/2018  . Encephalopathy, hypertensive   . Acute encephalopathy 03/17/2018  . Scleroderma ([place]) 02/28/2016  . Dyspnea 02/10/2016  . Abnormal blood finding 02/10/2016  . CAD (coronary artery disease) 02/10/2016  . Osteoarthritis 02/10/2016  . Nodule on liver 12/06/2015  . ILD (interstitial lung disease) ([place]) 05/31/2015  . Type 2 diabetes mellitus with hyperglycemia ([place])  03/11/2015  . Diabetes mellitus type 2 with retinopathy ([place]) 03/04/2015  . Vitamin D deficiency 10/01/2014  . Leukocytopenia 05/31/2014  . Chronic kidney disease (CKD), stage III (moderate) ([place]) 05/31/2014  . Fatigue 05/31/2014  . Lower abdominal pain 05/17/2014  . Nausea with vomiting 09/22/2013  . Chronic right SI joint pain 09/22/2013  . 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 Abrasion of ear canal 09/28/2012  . History of subarachnoid hemorrhage 04/07/2012  . Unspecified cerebral artery occlusion with cerebral infarction 04/07/2012  . UTI (urinary tract infection) 11/02/2011  . General medical examination 01/07/2011  . Chest pain 11/27/2010  . Achilles tendon injury 11/27/2010  . Back pain 07/01/2010  . Breast pain 04/15/2010  . GAIT DISTURBANCE 08/07/2009  . Anxiety state 07/03/2009  . VALVULAR HEART DISEASE 05/23/2008  . NECK PAIN, CHRONIC 04/03/2008  . DEGENERATIVE JOINT DISEASE, CERVICAL SPINE 06/23/2006  . Osteoporosis 06/23/2006  . Hyperlipidemia associated with type 2 diabetes mellitus ([place]) 05/12/2006  . DEPRESSION 05/12/2006  . Essential hypertension 05/12/2006  . ALLERGIC RHINITIS 05/12/2006  . ACID REFLUX DISEASE 05/12/2006  . SCIATICA 05/12/2006    Past Surgical History:  Procedure Laterality Date  . ABDOMINAL HYSTERECTOMY  1977   no oophorectomy  . APPENDECTOMY    . CARDIAC CATHETERIZATION    . CATARACT EXTRACTION, BILATERAL  2011  . SPINAL FUSION  2000   Dr. Vertell Limber  . TONSILLECTOMY       OB History   No obstetric history on file.     Family History  Problem Relation Age of Onset  . Intracerebral hemorrhage Daughter        assoc with post-partum  . Stroke Mother   . Diabetes Sister   . Asthma Sister   . Diabetes Sister   . Breast cancer Other        ?Aunt  . Cancer Other        breast?  . Coronary artery disease Neg Hx     Social History   Tobacco Use  . Smoking status: Passive Smoke Exposure - Never Smoker  . Smokeless tobacco: Never  Used  . Tobacco comment: Mother & Children  Substance Use Topics  . Alcohol use: Yes    Alcohol/week: 1.0 standard drinks    Types: 1 Glasses of wine per week    Comment: occ  . Drug use: No    Home Medications Prior to Admission medications   Medication Sig Start Date End Date Taking? Authorizing Provider  ACCU-CHEK FASTCLIX LANCETS [place]  02/04/16   [provider]  ACCU-CHEK SMARTVIEW test strip  02/04/16   [provider]  aspirin 81 MG tablet Take 81 mg by mouth daily.    [provider]  atorvastatin (LIPITOR) 40 MG tablet Take 1 tablet (40 mg total) by mouth daily at 6 PM. 03/20/18   Alfredia Ferguson,  Omair Latif, DO  clonazePAM (KLONOPIN) 0.5 MG tablet TAKE 1 TABLET TWICE DAILY AS NEEDED FOR ANXIETY Patient taking differently: Take 0.5 mg by mouth 2 (two) times daily as needed for anxiety.  10/08/16   Midge Minium, MD  Cyanocobalamin (VITAMIN B-12 PO) Take 1 tablet by mouth daily.     [provider]  famotidine (PEPCID) 40 MG tablet Take 40 mg by mouth daily.  11/15/17 04/26/19  [provider]  fenofibrate 160 MG tablet TAKE 1 TABLET EVERY DAY Patient taking differently: Take 160 mg by mouth daily.  01/26/17   Midge Minium, MD  glimepiride (AMARYL) 4 MG tablet Take 2 mg by mouth 2 (two) times daily.  02/19/14   [provider]  HYDROcodone-acetaminophen (NORCO/VICODIN) 5-325 MG tablet Take 1 tablet by mouth every 6 (six) hours as needed for moderate pain. 10/13/16   Midge Minium, MD  Insulin NPH, Human,, Isophane, (HUMULIN N) 100 UNIT/ML Kiwkpen Inject into the skin. Take 36 units am and 6 units pm    [provider]  insulin regular (NOVOLIN R) 100 units/mL injection Inject into the skin 3 (three) times daily before meals. 23 unites before meals    [provider]  Insulin Syringe-Needle U-100 (INSULIN SYRINGE .5CC/31GX5/16") 31G X 5/16" 0.5 ML MISC 40 units in morning and 18units at night 02/04/16   [provider]  lisinopril (PRINIVIL,ZESTRIL) 10 MG tablet Take 10 mg by mouth daily.     [provider]  metoprolol succinate (TOPROL-XL) 50 MG 24 hr tablet TAKE 1 TABLET EVERY DAY.  TAKE  WITH  OR  IMMEDIATELY FOLLOWING A MEAL  Patient taking differently: Take 50 mg by mouth daily.  11/04/16   Midge Minium, MD  Nintedanib (OFEV) 100 MG CAPS Take 1 capsule (100 mg total) by mouth 2 (two) times daily after a meal. Patient not taking: Reported on 04/24/2019 03/22/19   Marshell Garfinkel, MD    Allergies    Cephalexin and Pioglitazone  Review of Systems   Review of Systems All systems reviewed and negative, other than as noted in HPI.  Physical Exam Updated Vital Signs BP (!) 157/92   Pulse 83   Temp 97.9 F (36.6 C) (Oral)   Resp 12   SpO2 96%   Physical Exam Vitals and nursing note reviewed.  Constitutional:      Appearance: She is well-developed.     Comments: Laying in bed. Appears uncomfortable  HENT:     Head: Normocephalic and atraumatic.  Eyes:     General:        Right eye: No discharge.        Left eye: No discharge.     Conjunctiva/sclera: Conjunctivae normal.  Cardiovascular:     Rate and Rhythm: Normal rate and regular rhythm.     Heart sounds: Normal heart sounds. No murmur. No friction rub. No gallop.   Pulmonary:     Effort: Pulmonary effort is normal. No respiratory distress.     Breath sounds: Normal breath sounds.  Abdominal:     General: There is no distension.     Palpations: Abdomen is soft.     Tenderness: There is no abdominal tenderness.  Musculoskeletal:        General: No tenderness.     Cervical back: Neck supple.  Skin:    General: Skin is warm and dry.  Neurological:     Mental Status: She is alert.     Comments: Seems  to have R gaze preference although pupils will clearly pass midline. CN 2-12 intact. Speech clear. Content appropriate. Following commands. 4/5 strength LUE. 5/5 all other extremities.   Psychiatric:         Thought Content: Thought content normal.     ED Results / Procedures / Treatments   Labs (all labs ordered are listed, but only abnormal results are displayed) Labs Reviewed  SEDIMENTATION RATE  CBC WITH DIFFERENTIAL/PLATELET  BASIC METABOLIC PANEL  URINALYSIS, ROUTINE W REFLEX MICROSCOPIC    EKG EKG Interpretation  Date/Time:  Sunday Jun 18 2019 11:30:33 EDT Ventricular Rate:  84 PR Interval:    QRS Duration: 96 QT Interval:  362 QTC Calculation: 428 R Axis:   52 Text Interpretation: Undetermined rhythm Suspect sinus rhythm versus atrial fibrillation Confirmed by Virgel Manifold [phone]) on 06/18/2019 12:02:33 PM   Radiology CT Head Wo Contrast  Result Date: 06/18/2019 CLINICAL DATA:  Headache with nausea EXAM: CT HEAD WITHOUT CONTRAST TECHNIQUE: Contiguous axial images were obtained from the base of the skull through the vertex without intravenous contrast. COMPARISON:  March 17, 2018 FINDINGS: Brain: Ventricles and sulci are normal in size and configuration. There is a focal apparent hemorrhagic infarct arising in the mid right occipital lobe with mild surrounding edema. This focus of hemorrhage measures 3.0 x 2.6 x 4.0 cm. No other hemorrhage evident. No well-defined mass. No extra-axial fluid collection or midline shift. There is patchy small vessel disease in the centra semiovale bilaterally. Vascular: No hyperdense vessel evident. Foci of calcification noted in each carotid siphon region. Skull: Bony calvarium appears intact. Sinuses/Orbits: Mucosal thickening noted in each maxillary antrum inferiorly. There are retention cysts in the right maxillary antrum. There is mucosal thickening in several ethmoid air cells. Orbits appear symmetric bilaterally. Other: Mastoid air cells are clear. IMPRESSION: Focal hemorrhage, presumably from hemorrhagic infarct in the mid occipital lobe measuring 3.0 x 2.6 x 4.0 cm. Mild surrounding cytotoxic appearing edema. No other hemorrhage evident.  Elsewhere there is small vessel disease within portions of the centra semiovale bilaterally. Foci of arterial vascular calcification noted. Foci of paranasal sinus disease noted at several sites. Critical Value/emergent results were called by telephone at the time of interpretation on 06/18/2019 at 12:09 pm to provider [facility] , who verbally acknowledged these results. Electronically Signed   By: Lowella Grip III M.D.   On: 06/18/2019 12:10    Procedures Procedures (including critical care time)  CRITICAL CARE Performed by: Virgel Manifold Total critical care time: 35 minutes Critical care time was exclusive of separately billable procedures and treating other patients. Critical care was necessary to treat or prevent imminent or life-threatening deterioration. Critical care was time spent personally by me on the following activities: development of treatment plan with patient and/or surrogate as well as nursing, discussions with consultants, evaluation of patient's response to treatment, examination of patient, obtaining history from patient or surrogate, ordering and performing treatments and interventions, ordering and review of laboratory studies, ordering and review of radiographic studies, pulse oximetry and re-evaluation of patient's condition.   Medications Ordered in ED Medications  morphine 4 MG/ML injection 4 mg (0 mg Intravenous Hold 06/18/19 1133)  0.9 %  sodium chloride infusion ( Intravenous New Bag/Given 06/18/19 1133)  clevidipine (CLEVIPREX) infusion 0.5 mg/mL (has no administration in time range)  metoCLOPramide (REGLAN) injection 5 mg (5 mg Intravenous Given 06/18/19 1133)  diphenhydrAMINE (BENADRYL) injection 12.5 mg (12.5 mg Intravenous Given 06/18/19 1133)    ED Course  I have  reviewed the triage vital signs and the nursing notes.  Pertinent labs & imaging results that were available during my care of the patient were reviewed by me and considered in my medical  decision making (see chart for details).    MDM Rules/Calculators/A&P                      86 year old female with right retro-orbital headache and left-sided deficits.  Imaging showing a hemorrhagic infarct.  Hypertensive.  Cleviprex ordered.  Neurology consultation.  Admission.    Final Clinical Impression(s) / ED Diagnoses Final diagnoses:  Hemorrhagic stroke [facility])    Rx / Toast Orders ED Discharge Orders    None       Virgel Manifold, MD 06/18/19 1228

## 2019-06-18 NOTE — H&P (Addendum)
Admission H&P    Chief Complaint: headache HPI: Sandra [name] is an 86 y.o. female with PMH of HTN, MI, CKD3, DM2, depression, previous strokes and ICH with mild cognitive decline over last 1-69yr per family but able to do her self-care and ambulates w/o assistance, but relies on family for finances, shopping, cooking (mRS 1). Pt and family state she went to bed in usual state on Fri 5/28. She woke up on the floor Sat am, c/o h/a and rib pain. Her family reports that she seemed more confused than normal and had difficulty walking when they got her up. She did go to the ER for this yesterday, but due to very long wait time, she went home. Symptoms persisted and family brought her back to ER today. CTH showed ICH in the right occipital lobe. On exam there is a left hemianopsia with mild neglect.  LSN: Friday night at bedtime ~10p tPA Given: no, ICH ICH volume: 8ml; ICH score: 1  Past Medical History Past Medical History:  Diagnosis Date  . Abnormal blood finding 02/10/2016  . Abrasion of ear canal 09/28/2012  . Achilles tendon injury 11/27/2010  . ACID REFLUX DISEASE 05/12/2006   Qualifier: Diagnosis of  By: Larose Kells MD, [place] Acute MI ([place])   . ALLERGIC RHINITIS 05/12/2006   Qualifier: Diagnosis of  By: Larose Kells MD, [place] Anxiety state 07/03/2009   Qualifier: Diagnosis of  By: Larose Kells MD, [place] pain 07/01/2010  . Chronic kidney disease (CKD), stage III (moderate) ([place]) 05/31/2014  . Chronic right SI joint pain 09/22/2013  . DEGENERATIVE JOINT DISEASE, CERVICAL SPINE 06/23/2006   Annotation: had a CAT scan with a cervical myelogram that show  left C6-7 and  C5-6 foraminal stenosis Qualifier: Diagnosis of  By: Larose Kells MD, [place] DEPRESSION 05/12/2006   Qualifier: Diagnosis of  By: Larose Kells MD, [place] DIABETES MELLITUS, TYPE II 05/12/2006   Now following w/ Endo at [place] 04/27/2007   Qualifier: Diagnosis of  By: Jerold Coombe    . Dyspnea 02/10/2016  .  Encephalopathy, hypertensive   . Essential hypertension 05/12/2006   Qualifier: Diagnosis of  By: Larose Kells MD, [place] Fatigue 05/31/2014  . GAIT DISTURBANCE 08/07/2009   Qualifier: Diagnosis of  By: Larose Kells MD, [place] GERD (gastroesophageal reflux disease)   . Headache(784.0) 11/27/2010  . Hyperlipidemia   . Hyperlipidemia associated with type 2 diabetes mellitus ([place]) 05/12/2006   Qualifier: Diagnosis of  By: Larose Kells MD, [place] ILD (interstitial lung disease) ([place]) 05/31/2015  . Leukocytopenia 05/31/2014  . Lower abdominal pain 05/17/2014  . Nausea with vomiting 09/22/2013  . NECK PAIN, CHRONIC 04/03/2008   Qualifier: Diagnosis of  By: Larose Kells MD, [place] Nodule on liver 12/06/2015  . Osteoarthritis 02/10/2016  . Osteopenia   . Osteoporosis 06/23/2006   Annotation: had a bone density test in 08-2004.  T score was -2.4 Qualifier: Diagnosis of  By: Larose Kells MD, [place] SAH (subarachnoid hemorrhage) ([place])   . Sciatica   . Scleroderma ([place]) 02/28/2016  . Severe hypertension 03/18/2018  . Slurred speech 11/27/2010  . Takotsubo cardiomyopathy    s/p stress MI with stress induced CM with normal coronary arteries by cath 2007 with normalization of LVF by echo 04/2005  . TIA (transient ischemic attack)   .  Unspecified cerebral artery occlusion with cerebral infarction 04/07/2012  . UTI (urinary tract infection) 11/02/2011  . 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 Vitamin D deficiency 10/01/2014    Past Surgical History Past Surgical History:  Procedure Laterality Date  . ABDOMINAL HYSTERECTOMY  1977   no oophorectomy  . APPENDECTOMY    . CARDIAC CATHETERIZATION    . CATARACT EXTRACTION, BILATERAL  2011  . SPINAL FUSION  2000   Dr. Vertell Limber  . TONSILLECTOMY      Family History Family History  Problem Relation Age of Onset  . Intracerebral hemorrhage Daughter        assoc with post-partum  . Stroke Mother   . Diabetes Sister   . Asthma Sister   . Diabetes Sister   . Breast cancer Other         ?Aunt  . Cancer Other        breast?  . Coronary artery disease Neg Hx     Social History  reports that she is a non-smoker but has been exposed to tobacco smoke. She has never used smokeless tobacco. She reports current alcohol use of about 1.0 standard drinks of alcohol per week. She reports that she does not use drugs.  Allergies Allergies  Allergen Reactions  . Cephalexin Nausea And Vomiting    Pt stated made severely sick, will never take again  . Pioglitazone Other (See Comments)    REACTION: EDEMA    Home Medications (Not in a hospital admission)   Hospital Medications .  morphine injection  4 mg Intravenous Once    ROS:  History obtained from pt and family  General ROS: negative for - chills, fatigue, fever, night sweats, weight gain or weight loss Psychological ROS: negative for - behavioral disorder, hallucinations, memory difficulties, mood swings or suicidal ideation Ophthalmic ROS: negative for - blurry vision, double vision, eye pain or loss of vision ENT ROS: negative for - epistaxis, nasal discharge, oral lesions, sore throat, tinnitus or vertigo Allergy and Immunology ROS: negative for - hives or itchy/watery eyes Hematological and Lymphatic ROS: negative for - bleeding problems, bruising or swollen lymph nodes Endocrine ROS: negative for - galactorrhea, hair pattern changes, polydipsia/polyuria or temperature intolerance Respiratory ROS: negative for - cough, hemoptysis, shortness of breath or wheezing Cardiovascular ROS: negative for - chest pain, dyspnea on exertion, edema or irregular heartbeat Gastrointestinal ROS: negative for - abdominal pain, diarrhea, hematemesis, nausea/vomiting or stool incontinence Genito-Urinary ROS: negative for - dysuria, hematuria, incontinence or urinary frequency/urgency Musculoskeletal ROS: negative for - joint swelling or muscular weakness. Positive for reb/back pain Neurological ROS: as noted in HPI Dermatological ROS:  negative for rash and skin lesion changes   Physical Examination:  Vitals:   06/18/19 1256 06/18/19 1258 06/18/19 1305 06/18/19 1310  BP: 105/90 120/60 101/86 (!) 114/102  Pulse: (!) 105 100 100 (!) 107  Resp: 18 15 16 20   Temp:      TempSrc:      SpO2: 96% 95% 95% 99%    General - mod-severe distress. Heart - Regular rate and rhythm - no murmer Lungs - Clear to auscultation Abdomen - Soft - non tender Extremities - Distal pulses intact - no edema Skin - Warm and dry   Neurologic Examination:   Mental Status:  Alerts to voice, but keeps eyes closed most of time d/t photosensitivity? She is difficult to direct and has poor attention. Speech with mild dysarthria, would not name objects for me. Tells me her name and  DOB, age, but gives no other details when asked. Moans in pain and holds her head most of exam. Cranial Nerves:  II-left hemianopia with mild neglect III/IV/VI-Pupils were equal and reacted. Extraocular movements were full.  V/VII-no facial numbness and no facial weakness.  VIII-hearing normal.  X-normal speech and symmetrical palatal movement.  XII-midline tongue extension  Motor: bilat arm strength is equal w/o drift. Due to back/rib pain she cannot lift legs well, but do not see any focal leg weakness upon gross exam. Foot pushes equal Tone and bulk:normal tone throughout; no atrophy noted Sensory: Intact to light touch in all extremities. Plantars: Downgoing bilaterally  Cerebellar: unable Gait: not tested   LABORATORY STUDIES   Basic Metabolic Panel: Recent Labs  Lab 06/17/19 2130 06/18/19 1235  NA 143 139  K 4.7 5.0  CL 104 108  CO2 22 18*  GLUCOSE 253* 235*  BUN 34* 28*  CREATININE 1.69* 1.60*  CALCIUM 9.7 9.3    Liver Function Tests: No results for input(s): AST, ALT, ALKPHOS, BILITOT, PROT, ALBUMIN in the last 168 hours. No results for input(s): LIPASE, AMYLASE in the last 168 hours. No results for input(s): AMMONIA in the last 168  hours.  CBC: Recent Labs  Lab 06/17/19 2130 06/18/19 1235  WBC 8.5 8.5  NEUTROABS  --  6.2  HGB 12.1 12.7  HCT 37.0 39.4  MCV 102.2* 103.1*  PLT 310 259    Cardiac Enzymes: No results for input(s): CKTOTAL, CKMB, CKMBINDEX, TROPONINI in the last 168 hours.  BNP: Invalid input(s): POCBNP  CBG: No results for input(s): GLUCAP in the last 168 hours.  Microbiology:   Coagulation Studies: No results for input(s): LABPROT, INR in the last 72 hours.  Urinalysis: No results for input(s): COLORURINE, LABSPEC, PHURINE, GLUCOSEU, HGBUR, BILIRUBINUR, KETONESUR, PROTEINUR, UROBILINOGEN, NITRITE, LEUKOCYTESUR in the last 168 hours.  Invalid input(s): APPERANCEUR  Lipid Panel:     Component Value Date/Time   CHOL 174 03/19/2018 0521   TRIG 312 (H) 03/19/2018 0521   HDL 29 (L) 03/19/2018 0521   CHOLHDL 6.0 03/19/2018 0521   VLDL 62 (H) 03/19/2018 0521   LDLCALC 83 03/19/2018 0521    HgbA1C:  Lab Results  Component Value Date   HGBA1C 9.4 (H) 03/19/2018    Urine Drug Screen:      Component Value Date/Time   LABOPIA NONE DETECTED 01/11/2016 1236   COCAINSCRNUR NONE DETECTED 01/11/2016 1236   LABBENZ NONE DETECTED 01/11/2016 1236   AMPHETMU NONE DETECTED 01/11/2016 1236   THCU NONE DETECTED 01/11/2016 1236   LABBARB NONE DETECTED 01/11/2016 1236     Alcohol Level:  No results for input(s): ETH in the last 168 hours.  Miscellaneous Labs:  EKG:  EKG      IMAGING CT Head Wo Contrast  Result Date: 06/18/2019 CLINICAL DATA:  Headache with nausea EXAM: CT HEAD WITHOUT CONTRAST TECHNIQUE: Contiguous axial images were obtained from the base of the skull through the vertex without intravenous contrast. COMPARISON:  March 17, 2018 FINDINGS: Brain: Ventricles and sulci are normal in size and configuration. There is a focal apparent hemorrhagic infarct arising in the mid right occipital lobe with mild surrounding edema. This focus of hemorrhage measures 3.0 x 2.6 x  4.0 cm. No other hemorrhage evident. No well-defined mass. No extra-axial fluid collection or midline shift. There is patchy small vessel disease in the centra semiovale bilaterally. Vascular: No hyperdense vessel evident. Foci of calcification noted in each carotid siphon region. Skull: Bony calvarium appears intact.  Sinuses/Orbits: Mucosal thickening noted in each maxillary antrum inferiorly. There are retention cysts in the right maxillary antrum. There is mucosal thickening in several ethmoid air cells. Orbits appear symmetric bilaterally. Other: Mastoid air cells are clear. IMPRESSION: Focal hemorrhage, presumably from hemorrhagic infarct in the mid occipital lobe measuring 3.0 x 2.6 x 4.0 cm. Mild surrounding cytotoxic appearing edema. No other hemorrhage evident. Elsewhere there is small vessel disease within portions of the centra semiovale bilaterally. Foci of arterial vascular calcification noted. Foci of paranasal sinus disease noted at several sites. Critical Value/emergent results were called by telephone at the time of interpretation on 06/18/2019 at 12:09 pm to provider [facility] , who verbally acknowledged these results. Electronically Signed   By: Lowella Grip III M.D.   On: 06/18/2019 12:10   Assessment: 86 y.o. female presents with 2 days of h/a and dizziness. CTH shows right occipital ICH, ICH score 1 for age. Family reports cognitive decline over last 1-20yr (mRS 1). On exam she has Lt hemianopia and mild neglect, otherwise no focal deficits. She is confused and difficult to direct at this time.   1. 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 Ddx: HTN or underlying ischemic stroke with hemorrhagic conversion 2. Clinically significant cerebral edema- d/t above 3. Headache- d/t above 4. Hypertenive Emergency- goal SBP <160. Cleviprex started in ER 5. Essential HTN- resume home meds once cleared for PO 6. Confusion and  baseline cognitive decline noted by family- possibly underlying CAA 7. DM2- SSI for now. Ck A1c  Plan:  Admit to ICU for close observation, greater than 2 MN  Cleviprex to keep SBP <160   MRI brain without contrast  PT consult, OT consult, Speech consult  NPO till swallow can be assessed  HOLD ASA and any anticoagulation  SCDs only for DVT ppx  Risk factor modification  Telemetry monitoring  Frequent neuro checks  Tx headache symptomatically, tramadol added  HgbA1c added to labs given Dm2  Attending Neurologist's Note to Follow Desiree Metzger-Cihelka, ARNP-C, ANVP-BC Pager: [phone]   NEUROHOSPITALIST ADDENDUM Performed a face to face diagnostic evaluation.   I have reviewed the contents of history and physical exam as documented by PA/ARNP/Resident and agree with above documentation.  I have discussed and formulated the above plan as documented. Edits to the note have been made as needed.  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  CT obtained in the emergency department today shows right parietotemporal hemorrhage versus hemorrhagic infarct.  Blood pressure elevated-started on Cleviprex.   On examination -Patient has left homonymous hemianopsia, minimal left-sided weakness.  Appears in distress.  Sinus rhythm.  Impression Acute right parietotemporal hemorrhage versus hemorrhagic infarct Differential includes amyloid (reviewed MRI-has chronic microhemorrhages, plus history of subarachnoid hemorrhage and cognitive decline would favor this) vs embolic  infarct with hemorrhagic conversion vs hypertensive hemorrhage.  Plan as stated above: No antiplatelet therapy, BP less than 932 systolic.  MRI brain without contrast.  Symptomatic management for headache   This patient is neurologically critically ill due to right parietotemporal hemorrhage.  He is at risk  for significant risk of neurological worsening from cerebral edema,  death from brain herniation, heart failure, , infection, respiratory failure and seizure. This patient's care requires constant monitoring of vital signs, hemodynamics, respiratory and cardiac monitoring, review of multiple databases, neurological assessment, discussion with family, other specialists and medical decision making of high complexity.  I spent 35 minutes of neurocritical time in the care of this patient independent of time spent by NP.       Sandra Addison Taiana Temkin MD [facility] [phone]   If 7pm to 7am, please call on call as listed on AMION.

## 2019-06-18 NOTE — Progress Notes (Signed)
Pt states she is anxious, paged Dr. Leonel Ramsay about antianxiety meds she takes at home, meds can be restarted tomorrow.   Pt refusing to use purewick, placed pt on a bedpan and refuses to use the bedpan or pee while in bed, explained to pt why it is unsafe to get out of bed at this time. Per Dr. Leonel Ramsay place a foley.  Pt stating she would like to leave, explained to the pt why it is not safe to go home at this time, pt still insisted that she would like to leave, Dr. Leonel Ramsay paged and spoke with the patient as well.

## 2019-06-18 NOTE — ED Notes (Signed)
No answer for vitals recheck and not visible in lobby

## 2019-06-18 NOTE — ED Notes (Signed)
Patient transported to CT 

## 2019-06-18 NOTE — ED Notes (Signed)
Pt and grandson upset due to wait time. Encouraged pt to stay, agreeable to stay for now.

## 2019-06-19 ENCOUNTER — Inpatient Hospital Stay (HOSPITAL_COMMUNITY): Payer: Medicare HMO

## 2019-06-19 DIAGNOSIS — I6389 Other cerebral infarction: Secondary | ICD-10-CM

## 2019-06-19 LAB — GLUCOSE, CAPILLARY
Glucose-Capillary: 210 mg/dL — ABNORMAL HIGH (ref 70–99)
Glucose-Capillary: 167 mg/dL — ABNORMAL HIGH (ref 70–99)
Glucose-Capillary: 157 mg/dL — ABNORMAL HIGH (ref 70–99)
Glucose-Capillary: 162 mg/dL — ABNORMAL HIGH (ref 70–99)

## 2019-06-19 LAB — ECHOCARDIOGRAM COMPLETE

## 2019-06-19 MED ORDER — LISINOPRIL 10 MG PO TABS
10.0000 mg | ORAL_TABLET | Freq: Every day | ORAL | Status: DC
  Administered 2019-06-19: 10 mg via ORAL
  Filled 2019-06-19: qty 1

## 2019-06-19 MED ORDER — SERTRALINE HCL 50 MG PO TABS
50.0000 mg | ORAL_TABLET | Freq: Every day | ORAL | Status: DC
  Administered 2019-06-19 – 2019-06-22 (×4): 50 mg via ORAL
  Filled 2019-06-19 (×4): qty 1

## 2019-06-19 MED ORDER — METOPROLOL SUCCINATE ER 50 MG PO TB24
50.0000 mg | ORAL_TABLET | Freq: Every day | ORAL | Status: DC
  Administered 2019-06-19 – 2019-06-22 (×4): 50 mg via ORAL
  Filled 2019-06-19 (×4): qty 1

## 2019-06-19 MED ORDER — ONDANSETRON HCL 4 MG/2ML IJ SOLN
4.0000 mg | Freq: Four times a day (QID) | INTRAMUSCULAR | Status: DC | PRN
  Administered 2019-06-19: 4 mg via INTRAVENOUS

## 2019-06-19 MED ORDER — CLONAZEPAM 0.5 MG PO TBDP
0.5000 mg | ORAL_TABLET | Freq: Two times a day (BID) | ORAL | Status: DC
  Administered 2019-06-19 – 2019-06-21 (×5): 0.5 mg via ORAL
  Filled 2019-06-19 (×5): qty 1

## 2019-06-19 MED ORDER — ONDANSETRON HCL 4 MG PO TABS: 4.0000 mg | ORAL_TABLET | Freq: Four times a day (QID) | ORAL | Status: DC | PRN

## 2019-06-19 MED ORDER — PANTOPRAZOLE SODIUM 40 MG PO TBEC
40.0000 mg | DELAYED_RELEASE_TABLET | Freq: Every day | ORAL | Status: DC
  Administered 2019-06-19 – 2019-06-21 (×3): 40 mg via ORAL
  Filled 2019-06-19 (×3): qty 1

## 2019-06-19 MED ORDER — ONDANSETRON HCL 4 MG/2ML IJ SOLN: 4.0000 mg | Freq: Four times a day (QID) | INTRAMUSCULAR | Status: DC | PRN

## 2019-06-19 NOTE — Progress Notes (Signed)
STROKE TEAM PROGRESS NOTE   INTERVAL HISTORY Her son is at bedside, patient appears to have cognitive issues, she is tangential, poor attention, Patient says she had fallen about a week ago and hit the back of her head, she had rib contusions. She states she went to bed last evening and then woke up on the floor.  Vitals:   06/19/19 0815 06/19/19 0830 06/19/19 0845 06/19/19 0900  BP: (!) 147/51 (!) 132/48 133/69 (!) 138/49  Pulse: 81 83 94 85  Resp: (!) 21 (!) 22 (!) 23 (!) 21  Temp:      TempSrc:      SpO2: 95% 92% 96% 94%    CBC:  Recent Labs  Lab 06/17/19 2130 06/18/19 1235  WBC 8.5 8.5  NEUTROABS  --  6.2  HGB 12.1 12.7  HCT 37.0 39.4  MCV 102.2* 103.1*  PLT 310 235    Basic Metabolic Panel:  Recent Labs  Lab 06/17/19 2130 06/18/19 1235  NA 143 139  K 4.7 5.0  CL 104 108  CO2 22 18*  GLUCOSE 253* 235*  BUN 34* 28*  CREATININE 1.69* 1.60*  CALCIUM 9.7 9.3   Lipid Panel:     Component Value Date/Time   CHOL 174 03/19/2018 0521   TRIG 312 (H) 03/19/2018 0521   HDL 29 (L) 03/19/2018 0521   CHOLHDL 6.0 03/19/2018 0521   VLDL 62 (H) 03/19/2018 0521   LDLCALC 83 03/19/2018 0521   HgbA1c:  Lab Results  Component Value Date   HGBA1C 8.6 (H) 06/18/2019   Urine Drug Screen:     Component Value Date/Time   LABOPIA NONE DETECTED 01/11/2016 1236   COCAINSCRNUR NONE DETECTED 01/11/2016 1236   LABBENZ NONE DETECTED 01/11/2016 1236   AMPHETMU NONE DETECTED 01/11/2016 1236   THCU NONE DETECTED 01/11/2016 1236   LABBARB NONE DETECTED 01/11/2016 1236    Alcohol Level     Component Value Date/Time   ETH <5 01/11/2016 0955    IMAGING past 24 hours CT Head Wo Contrast  Result Date: 06/18/2019 CLINICAL DATA:  Headache with nausea EXAM: CT HEAD WITHOUT CONTRAST TECHNIQUE: Contiguous axial images were obtained from the base of the skull through the vertex without intravenous contrast. COMPARISON:  March 17, 2018 FINDINGS: Brain: Ventricles and sulci are normal  in size and configuration. There is a focal apparent hemorrhagic infarct arising in the mid right occipital lobe with mild surrounding edema. This focus of hemorrhage measures 3.0 x 2.6 x 4.0 cm. No other hemorrhage evident. No well-defined mass. No extra-axial fluid collection or midline shift. There is patchy small vessel disease in the centra semiovale bilaterally. Vascular: No hyperdense vessel evident. Foci of calcification noted in each carotid siphon region. Skull: Bony calvarium appears intact. Sinuses/Orbits: Mucosal thickening noted in each maxillary antrum inferiorly. There are retention cysts in the right maxillary antrum. There is mucosal thickening in several ethmoid air cells. Orbits appear symmetric bilaterally. Other: Mastoid air cells are clear. IMPRESSION: Focal hemorrhage, presumably from hemorrhagic infarct in the mid occipital lobe measuring 3.0 x 2.6 x 4.0 cm. Mild surrounding cytotoxic appearing edema. No other hemorrhage evident. Elsewhere there is small vessel disease within portions of the centra semiovale bilaterally. Foci of arterial vascular calcification noted. Foci of paranasal sinus disease noted at several sites. Critical Value/emergent results were called by telephone at the time of interpretation on 06/18/2019 at 12:09 pm to provider [facility] , who verbally acknowledged these results. Electronically Signed   By: Lowella Grip  III M.D.   On: 06/18/2019 12:10   MR BRAIN WO CONTRAST  Result Date: 06/18/2019 CLINICAL DATA:  Intracranial hemorrhage. Mild cognitive decline last 1-2 years. Headaches. EXAM: MRI HEAD WITHOUT CONTRAST TECHNIQUE: Multiplanar, multiecho pulse sequences of the brain and surrounding structures were obtained without intravenous contrast. COMPARISON:  CT head 06/18/2019.  MRI head 03/16/2018 FINDINGS: Brain: Hematoma right posterior temporoparietal lobe measuring approximately 2.5 x 2.9 cm. There is methemoglobin within the hematoma suggesting this  may have been present for several days. There is surrounding edema. No underlying mass identified however intravenous contrast was not administered. Hemorrhage extends into the subdural space with a small right-sided subdural hematoma measuring approximately 3 mm in thickness. No significant midline shift. No hemorrhage was seen in this area on the prior MRI. There are a few scattered areas of chronic microhemorrhage including the left frontal lobe and left occipital parietal lobe. These may be related to the patient's known severe hypertension. Negative for acute ischemic infarct. Moderate chronic microvascular ischemic change in the white matter. Ventricle size and cerebral volume normal. Vascular: Normal arterial flow voids. Skull and upper cervical spine: Negative Sinuses/Orbits: Mucosal edema paranasal sinuses. Bilateral cataract extraction Other: None IMPRESSION: Hematoma right posterior temporoparietal lobe. This contains methemoglobin suggesting it may have been present for several days. There is extension into the subdural space with a small right-sided subdural hematoma. No midline shift. Negative for acute infarct. Moderate chronic microvascular ischemic change Small areas of chronic microhemorrhage left frontal lobe and left parietal lobe. Findings suggest hypertensive bleed. Electronically Signed   By: Franchot Gallo M.D.   On: 06/18/2019 16:36   Chest Port 1 View  Result Date: 06/18/2019 CLINICAL DATA:  Intracerebral hemorrhage. EXAM: PORTABLE CHEST 1 VIEW COMPARISON:  06/06/2019 FINDINGS: Heart size is within normal limits. Low lung volumes are noted, however both lungs are clear. Cervical spine fusion hardware again noted. IMPRESSION: Low lung volumes. No active disease. Electronically Signed   By: Marlaine Hind M.D.   On: 06/18/2019 14:33    PHYSICAL EXAM Patient is sitting in a chair, difficulty following commands,  she is alert and awake, poor concentration and slightly tangential, her  speech is normal without aphasia or dysarthria, left hemianopia, pupils equal and reactive, EOMs intact, face symmetric, will not smile for me however face appears symmetric but difficult examination,  hearing intact to voice, midline tongue, course right arm tremor, normal bulk and tone, she is moving all extremities equally and antigravity without drift, intact to light touch.    ASSESSMENT/PLAN Sandra [name] is a 86 y.o. female with history of HTN, MI, CKD3, DM2, depression, previous strokes and ICH with mild cognitive decline over last 1-26yr (mRS 1) presenting with headache. CT with R occipital ICH.    Stroke:   R occipital ICH in setting of mild cognitive decline and fall - may be hypertensive source, traumatic due fall (she reports multiple falls), amyloid also a possibility  Code Stroke CT head mid occipital hemorrhage. B Small vessel disease. Sinus dz  MRI  R posterior temporoparietal lobe ICH with SDH, no shift, mild L frontal lobe and L parietal lobe chronic microhemorrhages   CTA head & neck GFR to high to get contrast. Order MRA  2D Echo pending   LDL pending   HgbA1c 8.6  SCDs for VTE prophylaxis  aspirin 81 mg daily prior to admission, now on No antithrombotic given hemorrhage   Therapy recommendations:  pending   Disposition:  pending   Hypertension  Only mildly elevated on admission 143/76 but up to the 190s following arrival  Home meds:  lisinopril 10, metoprolol 50  SBP goal < 140  On cleviprex . Long-term BP goal normotensive  Hyperlipidemia  Home meds:  lipitor 40  LDL pending, goal < 70  Hold statin in setting of hemorrhage  Diabetes type II Uncontrolled  Home meds:  Glimepiride 2 mg bid, NPH/R 36u/23u acb, 6u/27u acs  HgbA1c 8.6, goal < 7.0  CBGs Recent Labs    06/18/19 2133 06/19/19 0809  GLUCAP 145* 210*      SSI  Other Stroke Risk Factors  Advanced age  ETOH use, advised to drink no more than 1 drink(s) a day  Hx  stroke/TIA  02/2018 - transient episode of face and right body paresthesias along with right hand drawing up possibly TIA though sensory seizure or complicated migraine is also possible.   2012 - left parietal convexity hemorrhage due to RCVS in 2012 and question of  seizures but had done well since then  Family hx stroke (mother, dtr w/ ICH )  CAD  Other Active Problems  CKD stage 3, Cre 1.69->1.6  Hospital day # 1  Personally examined patient and images, and have participated in and made any corrections needed to history, physical, neuro exam,assessment and plan as stated above.  I have personally obtained the history, evaluated lab date, reviewed imaging studies and agree with radiology interpretations.    Sarina Ill, MD Stroke Neurology   A total of 35 minutes was spent for the care of this patient, spent on counseling patient and family on different diagnostic and therapeutic options, counseling and coordination of care, riskd ans benefits of management, compliance, or risk factor reduction and education.   To contact Stroke Continuity provider, please refer to [email or web address] After hours, contact General Neurology

## 2019-06-19 NOTE — Progress Notes (Signed)
Pt c/o headache and was offered tylenol but refused to take it saying "I wouldn't know what you are giving me" when assured it was tylenol and offered to see the package pt stated "you take it"

## 2019-06-19 NOTE — Evaluation (Signed)
Occupational Therapy Evaluation Patient Details Name: Sandra [name] MRN: [id number] DOB: May 25, 1933 Today's Date: 06/19/2019    History of Present Illness Sandra [name] is an 86 y.o. female with PMH of HTN, MI, CKD3, DM2, depression, previous strokes and ICH with mild cognitive decline admitted due to confusion, difficulty walking at home.  Found to have [place] R parietotemporal  versus hemorrhagic infarct.  MRI suggested hypertensive bleed.   Clinical Impression   Pt admitted with above. She demonstrates the below listed deficits and will benefit from continued OT to maximize safety and independence with BADLs.  Pt presents to OT with Lt neglect, Lt visual field deficit, impaired cognition, impaired balance.  She currently requires min - total A for ADLs and mod A +2 for functional mobility.  She reports she lives with her daughter and grand daughter, and was fully independent with ADLs, including driving, PTA.  Feel she would benefit from CIR level rehab at discharge.       Follow Up Recommendations  CIR;Supervision/Assistance - 24 hour    Equipment Recommendations  None recommended by OT    Recommendations for Other Services Rehab consult     Precautions / Restrictions Precautions Precautions: Fall Precaution Comments: L hemianopsia, L inattention      Mobility Bed Mobility Overal bed mobility: Needs Assistance Bed Mobility: Supine to Sit     Supine to sit: HOB elevated     General bed mobility comments: min A overall, initially supervision up from lying with HOB elevated, but needed help to get scooted forward and for balance  Transfers Overall transfer level: Needs assistance Equipment used: Rolling walker (2 wheeled) Transfers: Sit to/from [facility] Sit to Stand: +2 safety/equipment;Mod assist Stand pivot transfers: Mod assist;+2 physical assistance;+2 safety/equipment       General transfer comment: some lifting help from EOB    Balance  Overall balance assessment: Needs assistance Sitting-balance support: Feet unsupported Sitting balance-Leahy Scale: Fair Sitting balance - Comments: able to maintain static sitting with close min guard assist      Standing balance-Leahy Scale: Poor Standing balance comment: requires UE support and min A for static standing                            ADL either performed or assessed with clinical judgement   ADL Overall ADL's : Needs assistance/impaired Eating/Feeding: Supervision/ safety;Sitting(mod cues ) Eating/Feeding Details (indicate cue type and reason): assist for set up and cues to locate food items  Grooming: Wash/dry hands;Wash/dry face;Oral care;Brushing hair;Moderate assistance;Standing   Upper Body Bathing: Moderate assistance;Sitting   Lower Body Bathing: Maximal assistance;Sit to/from stand   Upper Body Dressing : Moderate assistance;Sitting   Lower Body Dressing: Total assistance;Sit to/from stand Lower Body Dressing Details (indicate cue type and reason): Pt attempted to don socks, but unable to access feet, and frequently dropped sock when holding it in Lt UE  Toilet Transfer: Moderate assistance;+2 for physical assistance;+2 for safety/equipment;Ambulation;Comfort height toilet;RW   Toileting- Clothing Manipulation and Hygiene: Maximal assistance;Sit to/from stand       Functional mobility during ADLs: Moderate assistance;+2 for physical assistance;+2 for safety/equipment;Rolling walker       Vision Baseline Vision/History: Wears glasses Wears Glasses: At all times Patient Visual Report: No change from baseline Vision Assessment?: Yes Eye Alignment: Within Functional Limits Ocular Range of Motion: Within Functional Limits Tracking/Visual Pursuits: Other (comment) Additional Comments: During fixation, pt looses object in her Lt visual field .  She  was unable to sustain arousal to participate in formal visual field assessment.  During functional  activities, she requires mod cues to scan to Lt to locate items      Perception Perception Perception Tested?: Yes Perception Deficits: Inattention/neglect Inattention/Neglect: Does not attend to left visual field;Does not attend to left side of body Spatial deficits: Pt requires mod cues to attend to Lt visual field and Lt hand  Comments: Pt demonstrated significant difficulty navigating when walking.  She goes to the right and requires constant cues and assist to move toward the left    Praxis      Pertinent Vitals/Pain Pain Assessment: 0-10 Pain Score: 6  Pain Location: head Pain Descriptors / Indicators: Headache Pain Intervention(s): Monitored during session     Hand Dominance Right   Extremity/Trunk Assessment Upper Extremity Assessment Upper Extremity Assessment: LUE deficits/detail LUE Deficits / Details: Lt UE [place] end stage IV.  Pt with decreased awareness of Lt UE reqiring mod cues to maintain Lt UE on walker  LUE Sensation: decreased proprioception LUE Coordination: decreased fine motor;decreased gross motor   Lower Extremity Assessment Lower Extremity Assessment: Defer to PT evaluation LLE Deficits / Details: lifts antigravity, functional weakness/coordination noted with ambulation LLE Coordination: decreased gross motor   Cervical / Trunk Assessment Cervical / Trunk Assessment: Kyphotic   Communication Communication Communication: No difficulties   Cognition Arousal/Alertness: Awake/alert Behavior During Therapy: Anxious;WFL for tasks assessed/performed Overall Cognitive Status: Impaired/Different from baseline Area of Impairment: Attention;Memory;Following commands;Safety/judgement;Awareness;Problem solving                   Current Attention Level: Sustained Memory: Decreased short-term memory Following Commands: Follows one step commands with increased time Safety/Judgement: Decreased awareness of safety;Decreased awareness of  deficits Awareness: Intellectual Problem Solving: Slow processing;Difficulty sequencing;Requires verbal cues;Requires tactile cues General Comments: Pt self distracts frequently requiring mod cues for selective attention.  Cues provided for sequencing and problem solving    General Comments  son present at end of session. VSS.      Exercises     Shoulder Instructions      Home Living Family/patient expects to be discharged to:: Private residence Living Arrangements: Children;Other relatives Available Help at Discharge: Family;Available PRN/intermittently Type of Home: House Home Access: Stairs to enter [employer] of Steps: 2 Entrance Stairs-Rails: None Home Layout: One level     Bathroom Shower/Tub: Teacher, early years/pre: Standard     Home Equipment: Environmental consultant - 2 wheels;Cane - single point;Shower seat;Hand held shower head          Prior Functioning/Environment Level of Independence: Needs assistance  Gait / Transfers Assistance Needed: walks no devices, fell backwards last Saturday coming in the door, fell PTA ADL's / Homemaking Assistance Needed: uses shower chair; helps with chores, likes to water her flowers; states she did her finances, shopping etc, but per chart needed assistance   Comments: drives        OT Problem List: Decreased strength;Decreased range of motion;Decreased activity tolerance;Impaired balance (sitting and/or standing);Impaired vision/perception;Decreased coordination;Decreased cognition;Decreased safety awareness;Decreased knowledge of use of DME or AE;Impaired UE functional use;Impaired sensation      OT Treatment/Interventions: Self-care/ADL training;Neuromuscular education;DME and/or AE instruction;Therapeutic activities;Cognitive remediation/compensation;Visual/perceptual remediation/compensation;Patient/family education;Balance training    OT Goals(Current goals can be found in the care plan section) Acute Rehab OT  Goals Patient Stated Goal: "I want to go home, where things are comfortable"  OT Goal Formulation: With patient Time For Goal Achievement: 07/03/19 Potential to Achieve Goals:  Good ADL Goals Pt Will Perform Eating: with supervision;sitting Pt Will Perform Grooming: with min assist;standing Pt Will Perform Upper Body Bathing: with supervision;sitting Pt Will Perform Lower Body Bathing: with min assist;sit to/from stand Pt Will Perform Upper Body Dressing: with min assist;sitting Pt Will Perform Lower Body Dressing: with min assist;sit to/from stand Pt Will Transfer to Toilet: with min assist;ambulating;regular height toilet;grab bars Pt Will Perform Toileting - Clothing Manipulation and hygiene: with min assist;sit to/from stand Additional ADL Goal #1: Pt will locate items on Lt with min cues during familiar ADLs  OT Frequency: Min 2X/week   Barriers to D/C:            Co-evaluation   Reason for Co-Treatment: For patient/therapist safety;To address functional/ADL transfers PT goals addressed during session: Mobility/safety with mobility;Balance;Proper use of DME        AM-PAC OT "6 Clicks" Daily Activity     Outcome Measure Help from another person eating meals?: A Little Help from another person taking care of personal grooming?: A Lot Help from another person toileting, which includes using toliet, bedpan, or urinal?: A Lot Help from another person bathing (including washing, rinsing, drying)?: A Lot Help from another person to put on and taking off regular upper body clothing?: A Lot Help from another person to put on and taking off regular lower body clothing?: Total 6 Click Score: 12   End of Session Equipment Utilized During Treatment: Gait belt;Rolling walker Nurse Communication: Mobility status  Activity Tolerance: Other (comment)(due to anxiety with mobility and Lt inattention ) Patient left: in chair;with call bell/phone within reach;with chair alarm set;with  family/visitor present;with nursing/sitter in room  OT Visit Diagnosis: Cognitive communication deficit (R41.841);Low vision, both eyes (H54.2);Unsteadiness on feet (R26.81) Symptoms and signs involving cognitive functions: Cerebral infarction                Time: 8768-1157 OT Time Calculation (min): 48 min Charges:  OT General Charges $OT Visit: 1 Visit OT Evaluation $OT Eval Moderate Complexity: 1 Mod OT Treatments $Self Care/Home Management : 8-22 mins  Nilsa Nutting., OTR/L Acute Rehabilitation Services Pager [phone] Office [place], [place] 06/19/2019, 2:34 PM

## 2019-06-19 NOTE — Progress Notes (Signed)
  Echocardiogram 2D Echocardiogram has been performed.  [name] 06/19/2019, 2:47 PM

## 2019-06-19 NOTE — Evaluation (Signed)
Speech Language Pathology Evaluation Patient Details Name: Jozy Mcphearson MRN: [id number] DOB: Jun 18, 1933 Today's Date: 06/19/2019 Time: 4010-2725 SLP Time Calculation (min) (ACUTE ONLY): 15 min  Problem List:  Patient Active Problem List   Diagnosis Date Noted  . ICH (intracerebral hemorrhage) ([place]) 06/18/2019  . TIA (transient ischemic attack) 04/26/2019  . Diarrhea 04/24/2019  . Severe hypertension 03/18/2018  . Encephalopathy, hypertensive   . Acute encephalopathy 03/17/2018  . Scleroderma ([place]) 02/28/2016  . Dyspnea 02/10/2016  . Abnormal blood finding 02/10/2016  . CAD (coronary artery disease) 02/10/2016  . Osteoarthritis 02/10/2016  . Nodule on liver 12/06/2015  . ILD (interstitial lung disease) ([place]) 05/31/2015  . Type 2 diabetes mellitus with hyperglycemia ([place]) 03/11/2015  . Diabetes mellitus type 2 with retinopathy ([place]) 03/04/2015  . Vitamin D deficiency 10/01/2014  . Leukocytopenia 05/31/2014  . Chronic kidney disease (CKD), stage III (moderate) ([place]) 05/31/2014  . Fatigue 05/31/2014  . Lower abdominal pain 05/17/2014  . Nausea with vomiting 09/22/2013  . Chronic right SI joint pain 09/22/2013  . 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 Abrasion of ear canal 09/28/2012  . History of subarachnoid hemorrhage 04/07/2012  . Unspecified cerebral artery occlusion with cerebral infarction 04/07/2012  . UTI (urinary tract infection) 11/02/2011  . General medical examination 01/07/2011  . Chest pain 11/27/2010  . Achilles tendon injury 11/27/2010  . Back pain 07/01/2010  . Breast pain 04/15/2010  . GAIT DISTURBANCE 08/07/2009  . Anxiety state 07/03/2009  . VALVULAR HEART DISEASE 05/23/2008  . NECK PAIN, CHRONIC 04/03/2008  . DEGENERATIVE JOINT DISEASE, CERVICAL SPINE 06/23/2006  . Osteoporosis 06/23/2006  . Hyperlipidemia associated with type 2 diabetes mellitus ([place]) 05/12/2006  . DEPRESSION 05/12/2006  . Essential hypertension 05/12/2006  . ALLERGIC RHINITIS  05/12/2006  . ACID REFLUX DISEASE 05/12/2006  . SCIATICA 05/12/2006   Past Medical History:  Past Medical History:  Diagnosis Date  . Abnormal blood finding 02/10/2016  . Abrasion of ear canal 09/28/2012  . Achilles tendon injury 11/27/2010  . ACID REFLUX DISEASE 05/12/2006   Qualifier: Diagnosis of  By: Larose Kells MD, [place] Acute MI ([place])   . ALLERGIC RHINITIS 05/12/2006   Qualifier: Diagnosis of  By: Larose Kells MD, [place] Anxiety state 07/03/2009   Qualifier: Diagnosis of  By: Larose Kells MD, [place] pain 07/01/2010  . Chronic kidney disease (CKD), stage III (moderate) ([place]) 05/31/2014  . Chronic right SI joint pain 09/22/2013  . DEGENERATIVE JOINT DISEASE, CERVICAL SPINE 06/23/2006   Annotation: had a CAT scan with a cervical myelogram that show  left C6-7 and  C5-6 foraminal stenosis Qualifier: Diagnosis of  By: Larose Kells MD, [place] DEPRESSION 05/12/2006   Qualifier: Diagnosis of  By: Larose Kells MD, [place] DIABETES MELLITUS, TYPE II 05/12/2006   Now following w/ Endo at [facility] 04/27/2007   Qualifier: Diagnosis of  By: Jerold Coombe    . Dyspnea 02/10/2016  . Encephalopathy, hypertensive   . Essential hypertension 05/12/2006   Qualifier: Diagnosis of  By: Larose Kells MD, [place] Fatigue 05/31/2014  . GAIT DISTURBANCE 08/07/2009   Qualifier: Diagnosis of  By: Larose Kells MD, [place] GERD (gastroesophageal reflux disease)   . Headache(784.0) 11/27/2010  . Hyperlipidemia   . Hyperlipidemia associated with type 2 diabetes mellitus ([place]) 05/12/2006   Qualifier: Diagnosis of  By: Larose Kells MD, [employer]  ILD (interstitial lung disease) ([place]) 05/31/2015  . Leukocytopenia 05/31/2014  . Lower abdominal pain 05/17/2014  . Nausea with vomiting 09/22/2013  . NECK PAIN, CHRONIC 04/03/2008   Qualifier: Diagnosis of  By: Larose Kells MD, [place] Nodule on liver 12/06/2015  . Osteoarthritis 02/10/2016  . Osteopenia   . Osteoporosis 06/23/2006   Annotation: had a bone density test in 08-2004.  T score  was -2.4 Qualifier: Diagnosis of  By: Larose Kells MD, [place] SAH (subarachnoid hemorrhage) ([place])   . Sciatica   . Scleroderma ([place]) 02/28/2016  . Severe hypertension 03/18/2018  . Slurred speech 11/27/2010  . Takotsubo cardiomyopathy    s/p stress MI with stress induced CM with normal coronary arteries by cath 2007 with normalization of LVF by echo 04/2005  . TIA (transient ischemic attack)   . Unspecified cerebral artery occlusion with cerebral infarction 04/07/2012  . UTI (urinary tract infection) 11/02/2011  . 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 Vitamin D deficiency 10/01/2014   Past Surgical History:  Past Surgical History:  Procedure Laterality Date  . ABDOMINAL HYSTERECTOMY  1977   no oophorectomy  . APPENDECTOMY    . CARDIAC CATHETERIZATION    . CATARACT EXTRACTION, BILATERAL  2011  . SPINAL FUSION  2000   Dr. Vertell Limber  . TONSILLECTOMY     HPI:  Fiorela Pelzer is an 86 y.o. female with PMH of HTN, MI, CKD3, DM2, depression, previous strokes and ICH with mild cognitive decline over last 1-83yr per family but able to do her self-care and ambulates w/o assistance, but relies on family for finances, shopping, cooking (mRS 1). Pt and family state she went to bed in usual state on Fri 5/28. She woke up on the floor Sat am, c/o h/a and rib pain. Hematoma right posterior temporoparietal lobe. This contains methemoglobin suggesting it may have been present for several days. There is extension into the subdural space with a small right-sided subdural hematoma. No midline shift.   Assessment / Plan / Recommendation Clinical Impression  Pt alert and pleasant, up in chair with left inattention, right gaze preference. She is able to utilize language appropriately, basic comprehension in tact. Speech WNL. She can verbalize recent events of the day and is oriented x4. Her main areas of impairment are awareness of visual and physical deficits. Pt can state some recall of this problem presented by PT/OT earlier in  the day, but pt does not attempt to compensate without cueing. She also verbalizes perhaps some decreased awareness of her baseline level of function and cognitive capacity, which is to be expected. Pt may benefit from f/u SLP services at next level of care as independence increases to address increased independence with cognition and safety, but at this time, acute needs are primarily physical. Will sign off acutely     SLP Assessment  SLP Recommendation/Assessment: All further Speech Lanaguage Pathology  needs can be addressed in the next venue of care SLP Visit Diagnosis: Cognitive communication deficit (R41.841)    Follow Up Recommendations       Frequency and Duration           SLP Evaluation Cognition  Overall Cognitive Status: Impaired/Different from baseline Arousal/Alertness: Awake/alert Orientation Level: Oriented X4 Attention: Focused;Sustained Focused Attention: Appears intact Sustained Attention: Appears intact Memory: Impaired Memory Impairment: Decreased long term memory Decreased Long Term Memory: Verbal basic Awareness: Impaired Awareness Impairment: Intellectual impairment;Emergent impairment Problem Solving: Impaired Problem Solving Impairment: Functional basic  Comprehension  Auditory Comprehension Overall Auditory Comprehension: Appears within functional limits for tasks assessed    Expression Expression Primary Mode of Expression: Verbal Verbal Expression Overall Verbal Expression: Appears within functional limits for tasks assessed Written Expression Dominant Hand: Right   Oral / Motor  Motor Speech Overall Motor Speech: Appears within functional limits for tasks assessed   GO                    Jarnell Cordaro, Katherene Ponto 06/19/2019, 2:57 PM

## 2019-06-19 NOTE — Progress Notes (Signed)
Chaplain responded to Spiritual Consult for patient's request for prayer.  Chaplain prayed with patient.  [place] Chaplain Resident

## 2019-06-19 NOTE — Progress Notes (Signed)
Pt resistive towards care, refuses to answer questions or follow commands, speech remains clear.

## 2019-06-19 NOTE — Evaluation (Signed)
Physical Therapy Evaluation Patient Details Name: Sandra [name] MRN: [id number] DOB: 1933-04-28 Today's Date: 06/19/2019   History of Present Illness  Sandra [name] is an 86 y.o. female with PMH of HTN, MI, CKD3, DM2, depression, previous strokes and ICH with mild cognitive decline admitted due to confusion, difficulty walking at home.  Found to have [place] R parietotemporal  versus hemorrhagic infarct.  MRI suggested hypertensive bleed.  Clinical Impression  Patient presents with decreased mobility due to decreased L side awareness, decreased L UE/LE strength, decreased balance and decreased activity tolerance.  Previously functioning with mobility and likely simple ADL's unaided.  Currently she is a high fall risk and will need skilled PT in while in acute setting and follow up CIR level rehab at d/c.      Follow Up Recommendations CIR    Equipment Recommendations  None recommended by PT    Recommendations for Other Services Rehab consult     Precautions / Restrictions Precautions Precautions: Fall Precaution Comments: L hemianopsia, L inattention      Mobility  Bed Mobility Overal bed mobility: Needs Assistance Bed Mobility: Supine to Sit     Supine to sit: HOB elevated     General bed mobility comments: min A overall, initially supervision up from lying with HOB elevated, but needed help to get scooted forward and for balance  Transfers Overall transfer level: Needs assistance Equipment used: Rolling walker (2 wheeled) Transfers: Sit to/from Stand Sit to Stand: +2 safety/equipment;Mod assist         General transfer comment: some lifting help from EOB  Ambulation/Gait Ambulation/Gait assistance: Mod assist;+2 safety/equipment Gait Distance (Feet): 60 Feet Assistive device: Rolling walker (2 wheeled);2 person hand held assist Gait Pattern/deviations: Decreased stride length;Drifts right/left;Trunk flexed     General Gait Details: initially with RW and 2 assist  for safety due to IV pole and pt not keeping L hand on walker nor going forward in straight path veering to the R; then pt anxious and fearful of falling and not able to keep walker straight so removed walker to use bilateral HHA for easier assistance, management.  Stairs            Wheelchair Mobility    Modified Rankin (Stroke Patients Only) Modified Rankin (Stroke Patients Only) Pre-Morbid Rankin Score: Moderate disability Modified Rankin: Moderately severe disability     Balance Overall balance assessment: Needs assistance   Sitting balance-Leahy Scale: Fair Sitting balance - Comments: difficulty with dynamic task, reaching to don her socks sitting EOB feet unsupported     Standing balance-Leahy Scale: Poor Standing balance comment: UE support for balance                             Pertinent Vitals/Pain      Home Living                        Prior Function                 Hand Dominance        Extremity/Trunk Assessment   Upper Extremity Assessment Upper Extremity Assessment: Defer to OT evaluation    Lower Extremity Assessment Lower Extremity Assessment: LLE deficits/detail LLE Deficits / Details: lifts antigravity, functional weakness/coordination noted with ambulation LLE Coordination: decreased gross motor    Cervical / Trunk Assessment Cervical / Trunk Assessment: Kyphotic  Communication      Cognition Arousal/Alertness: Awake/alert Behavior During  Therapy: WFL for tasks assessed/performed Overall Cognitive Status: Impaired/Different from baseline Area of Impairment: Attention;Memory;Following commands;Safety/judgement;Problem solving                   Current Attention Level: Sustained Memory: Decreased short-term memory Following Commands: Follows one step commands with increased time Safety/Judgement: Decreased awareness of deficits;Decreased awareness of safety   Problem Solving: Slow  processing;Difficulty sequencing;Requires verbal cues General Comments: repeated help to use L hand on walker, difficulty sequencing with walker and difficulty with forward progression pushing walker to R consistently      General Comments General comments (skin integrity, edema, etc.): son entered near end of session, patient having difficulty finding him as he was sitting on her left side.    Exercises     Assessment/Plan    PT Assessment Patient needs continued PT services  PT Problem List Decreased strength;Decreased activity tolerance;Decreased mobility;Decreased safety awareness;Decreased cognition;Impaired sensation;Decreased balance;Decreased coordination;Decreased knowledge of use of DME       PT Treatment Interventions DME instruction;Stair training;Therapeutic activities;Balance training;Cognitive remediation;Patient/family education;Neuromuscular re-education;Gait training;Functional mobility training    PT Goals (Current goals can be found in the Care Plan section)  Acute Rehab PT Goals Patient Stated Goal: to go home with family PT Goal Formulation: With patient/family Time For Goal Achievement: 07/03/19 Potential to Achieve Goals: Good    Frequency Min 4X/week   Barriers to discharge        Co-evaluation PT/OT/SLP Co-Evaluation/Treatment: Yes Reason for Co-Treatment: For patient/therapist safety;To address functional/ADL transfers PT goals addressed during session: Mobility/safety with mobility;Balance;Proper use of DME         AM-PAC PT "6 Clicks" Mobility  Outcome Measure Help needed turning from your back to your side while in a flat bed without using bedrails?: None Help needed moving from lying on your back to sitting on the side of a flat bed without using bedrails?: A Little Help needed moving to and from a bed to a chair (including a wheelchair)?: A Little Help needed standing up from a chair using your arms (e.g., wheelchair or bedside chair)?: A  Little Help needed to walk in hospital room?: A Lot Help needed climbing 3-5 steps with a railing? : A Lot 6 Click Score: 17    End of Session Equipment Utilized During Treatment: Gait belt Activity Tolerance: Patient limited by fatigue Patient left: in chair;with call bell/phone within reach;with chair alarm set   PT Visit Diagnosis: Other abnormalities of gait and mobility (R26.89);Muscle weakness (generalized) (M62.81);Hemiplegia and hemiparesis Hemiplegia - Right/Left: Left Hemiplegia - dominant/non-dominant: Non-dominant    Time: 7262-0355 PT Time Calculation (min) (ACUTE ONLY): 37 min   Charges:   PT Evaluation $PT Eval Moderate Complexity: [place], [place] Acute Rehabilitation Services [phone] 06/19/2019   [name] 06/19/2019, 2:10 PM

## 2019-06-19 NOTE — Plan of Care (Signed)
  Problem: Coping: Goal: Will verbalize positive feelings about self Outcome: Progressing Goal: Will identify appropriate support needs Outcome: Progressing   Problem: Self-Care: Goal: Verbalization of feelings and concerns over difficulty with self-care will improve 06/19/2019 1912 by Charise Carwin, RN Outcome: Progressing 06/19/2019 1836 by Charise Carwin, RN Outcome: Progressing Goal: Ability to communicate needs accurately will improve 06/19/2019 1912 by Charise Carwin, RN Outcome: Progressing 06/19/2019 1836 by Charise Carwin, RN Outcome: Progressing   Problem: Nutrition: Goal: Risk of aspiration will decrease Outcome: Progressing Goal: Dietary intake will improve Outcome: Progressing

## 2019-06-19 NOTE — Consult Note (Signed)
Physical Medicine and Rehabilitation Consult   Reason for Consult: ICH with functional deficits.  Referring Physician: Dr. Jaynee Eagles.    HPI: Sandra [name] is a 86 y.o. RH- female with history of prior hemorrhagic stroke and TIA, T2DM with retinopathy, CKD (Dr. Ambrose Pancoast), interstitial lung disease, chronic LBP, fall off her porch 06/06/19 with mild injuries. She had recurrent fall on 05/29 am with reports of HA and right elbow pain-- presented to ED that evening but left due to wait time. She was admitted on 06/18/19 am with reports of confusion, HA, dizziness and difficulty walking. She was found to have left hemianopsia with mild neglect and CT head done revealing focal hemorrhage in mid occipital lobe likely from hemorrhagic infarct and mild cytotoxic edema with small vessel disease.   MRI brain done showing hematoma in right posterior temporoparietal lobe containing methemoglobin and extension into subdural space with small SDH and evidence of prior microhemorrhages in left frontal and left parietal lobe likely due to hypertensive bleed.   Neurology questioned hypertension v/s trauma due to multiple falls v/s amyloid as cause of bleed.  MRA ordered for work up and ASA d/c due to bleed. On cleveprex with SBP goal < 140.  2 D echo done revealing EF 60-65% with grade 1 diastolic dysfunction and mild-moderate aortic valve sclerosis.  Therapy evaluations completed revealing decreased awareness of deficits, left hemianopsia  with left inattention as well as gait disorder with fear of falling. CIR recommended due to functional decline.   Pt reports LBM was this AM- feels SOB with a lot of movement- has foley per pt.     Review of Systems  Constitutional: Negative for chills and fever.  HENT: Negative for hearing loss and tinnitus.   Eyes:       Decreased vision.   Respiratory: Positive for shortness of breath. Negative for cough and wheezing.   Cardiovascular: Negative for chest pain and  palpitations.  Gastrointestinal: Negative for heartburn.  Genitourinary: Negative for dysuria and urgency.  Musculoskeletal: Positive for back pain (chronic for years).  Skin: Negative for rash.  Neurological: Positive for sensory change and focal weakness.  Psychiatric/Behavioral: Positive for memory loss. The patient is not nervous/anxious.   All other systems reviewed and are negative.    Past Medical History:  Diagnosis Date  . Abnormal blood finding 02/10/2016  . Abrasion of ear canal 09/28/2012  . Achilles tendon injury 11/27/2010  . ACID REFLUX DISEASE 05/12/2006   Qualifier: Diagnosis of  By: Larose Kells MD, [place] Acute MI ([place])   . ALLERGIC RHINITIS 05/12/2006   Qualifier: Diagnosis of  By: Larose Kells MD, [place] Anxiety state 07/03/2009   Qualifier: Diagnosis of  By: Larose Kells MD, [place] pain 07/01/2010  . Chronic kidney disease (CKD), stage III (moderate) ([place]) 05/31/2014  . Chronic right SI joint pain 09/22/2013  . DEGENERATIVE JOINT DISEASE, CERVICAL SPINE 06/23/2006   Annotation: had a CAT scan with a cervical myelogram that show  left C6-7 and  C5-6 foraminal stenosis Qualifier: Diagnosis of  By: Larose Kells MD, [place] DEPRESSION 05/12/2006   Qualifier: Diagnosis of  By: Larose Kells MD, [place] DIABETES MELLITUS, TYPE II 05/12/2006   Now following w/ Endo at [place] 04/27/2007   Qualifier: Diagnosis of  By: Jerold Coombe    . Dyspnea 02/10/2016  . Encephalopathy, hypertensive   .  Essential hypertension 05/12/2006   Qualifier: Diagnosis of  By: Larose Kells MD, [place] Fatigue 05/31/2014  . GAIT DISTURBANCE 08/07/2009   Qualifier: Diagnosis of  By: Larose Kells MD, [place] GERD (gastroesophageal reflux disease)   . Headache(784.0) 11/27/2010  . Hyperlipidemia   . Hyperlipidemia associated with type 2 diabetes mellitus ([place]) 05/12/2006   Qualifier: Diagnosis of  By: Larose Kells MD, [place] ILD (interstitial lung disease) ([place]) 05/31/2015  . Leukocytopenia 05/31/2014  .  Lower abdominal pain 05/17/2014  . Nausea with vomiting 09/22/2013  . NECK PAIN, CHRONIC 04/03/2008   Qualifier: Diagnosis of  By: Larose Kells MD, [place] Nodule on liver 12/06/2015  . Osteoarthritis 02/10/2016  . Osteopenia   . Osteoporosis 06/23/2006   Annotation: had a bone density test in 08-2004.  T score was -2.4 Qualifier: Diagnosis of  By: Larose Kells MD, [place] SAH (subarachnoid hemorrhage) ([place])   . Sciatica   . Scleroderma ([place]) 02/28/2016  . Severe hypertension 03/18/2018  . Slurred speech 11/27/2010  . Takotsubo cardiomyopathy    s/p stress MI with stress induced CM with normal coronary arteries by cath 2007 with normalization of LVF by echo 04/2005  . TIA (transient ischemic attack)   . Unspecified cerebral artery occlusion with cerebral infarction 04/07/2012  . UTI (urinary tract infection) 11/02/2011  . 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 Vitamin D deficiency 10/01/2014    Past Surgical History:  Procedure Laterality Date  . ABDOMINAL HYSTERECTOMY  1977   no oophorectomy  . APPENDECTOMY    . CARDIAC CATHETERIZATION    . CATARACT EXTRACTION, BILATERAL  2011  . SPINAL FUSION  2000   Dr. Vertell Limber  . TONSILLECTOMY      Family History  Problem Relation Age of Onset  . Intracerebral hemorrhage Daughter        assoc with post-partum  . Stroke Mother   . Diabetes Sister   . Asthma Sister   . Diabetes Sister   . Breast cancer Other        ?Aunt  . Cancer Other        breast?  . Coronary artery disease Neg Hx     Social History: Widowed. Retired accountant--worked till 2 years ago. Her daughter/grand-daughter live with her.  She was independent without AD--still drives. She  reports that she is a non-smoker but has been exposed to tobacco smoke. She has never used smokeless tobacco. She reports current alcohol use of about 1.0 standard drinks of alcohol per week. She reports that she does not use drugs.    Allergies  Allergen Reactions  . Cephalexin Nausea And Vomiting    Pt stated  made severely sick, will never take again  . Nintedanib Diarrhea  . Pioglitazone Other (See Comments)    REACTION: EDEMA    Medications Prior to Admission  Medication Sig Dispense Refill  . aspirin 81 MG tablet Take 81 mg by mouth daily.    Marland Kitchen atorvastatin (LIPITOR) 40 MG tablet Take 1 tablet (40 mg total) by mouth daily at 6 PM. 30 tablet 0  . b complex vitamins tablet Take 1 tablet by mouth daily.    . Cholecalciferol (VITAMIN D) 125 MCG (5000 UT) CAPS Take 5,000 Units by mouth daily.    . clonazePAM (KLONOPIN) 0.5 MG tablet TAKE 1 TABLET TWICE DAILY AS NEEDED FOR ANXIETY (Patient taking differently: Take 0.5 mg by mouth 2 (two) times daily as  needed for anxiety. ) 90 tablet 1  . Cyanocobalamin (VITAMIN B-12 PO) Take 1 tablet by mouth daily.     . famotidine (PEPCID) 40 MG tablet Take 40 mg by mouth daily.     . fenofibrate 54 MG tablet Take 54 mg by mouth daily.    Marland Kitchen glimepiride (AMARYL) 4 MG tablet Take 2 mg by mouth 2 (two) times daily.   2  . Insulin NPH, Human,, Isophane, (HUMULIN N) 100 UNIT/ML Kiwkpen Inject into the skin. Take 36 units am and 6 units pm    . insulin regular (NOVOLIN R) 100 units/mL injection Inject 23-27 Units into the skin See admin instructions. 23 units before Breakfast and lunch, 27 units before dinner.    Marland Kitchen lisinopril (PRINIVIL,ZESTRIL) 10 MG tablet Take 10 mg by mouth daily.     . metoprolol succinate (TOPROL-XL) 50 MG 24 hr tablet TAKE 1 TABLET EVERY DAY.  TAKE  WITH  OR  IMMEDIATELY FOLLOWING A MEAL  (Patient taking differently: Take 50 mg by mouth daily. ) 90 tablet 1  . sertraline (ZOLOFT) 50 MG tablet Take 50 mg by mouth daily.    . Turmeric 500 MG CAPS Take 500 mg by mouth daily.    . vitamin E (VITAMIN E) 180 MG (400 UNITS) capsule Take 400 Units by mouth daily.    Marland Kitchen ACCU-CHEK FASTCLIX LANCETS MISC     . ACCU-CHEK SMARTVIEW test strip     . HYDROcodone-acetaminophen (NORCO/VICODIN) 5-325 MG tablet Take 1 tablet by mouth every 6 (six) hours as needed  for moderate pain. (Patient not taking: Reported on 06/18/2019) 45 tablet 0  . Insulin Syringe-Needle U-100 (INSULIN SYRINGE .5CC/31GX5/16") 31G X 5/16" 0.5 ML MISC 40 units in morning and 18units at night    . Nintedanib (OFEV) 100 MG CAPS Take 1 capsule (100 mg total) by mouth 2 (two) times daily after a meal. (Patient not taking: Reported on 04/24/2019) 60 capsule 5    Home: Home Living Family/patient expects to be discharged to:: Private residence Living Arrangements: Children, Other relatives Available Help at Discharge: Family, Available PRN/intermittently Type of Home: House Home Access: Stairs to enter Technical brewer of Steps: 2 Entrance Stairs-Rails: None Home Layout: One level Bathroom Shower/Tub: Chiropodist: Standard Home Equipment: Environmental consultant - 2 wheels, [employer] - single point, Civil engineer, contracting, Hand held shower head  Lives With: Daughter  Functional History: Prior Function Level of Independence: Needs assistance Gait / Transfers Assistance Needed: walks no devices, fell backwards last Saturday coming in the door, fell PTA ADL's / Homemaking Assistance Needed: uses shower chair; helps with chores, likes to water her flowers; states she did her finances, shopping etc, but per chart needed assistance Comments: drives Functional Status:  Mobility: Bed Mobility Overal bed mobility: Needs Assistance Bed Mobility: Supine to Sit Supine to sit: HOB elevated General bed mobility comments: min A overall, initially supervision up from lying with HOB elevated, but needed help to get scooted forward and for balance Transfers Overall transfer level: Needs assistance Equipment used: Rolling walker (2 wheeled) Transfers: Sit to/from Stand, Stand Pivot Transfers Sit to Stand: +2 safety/equipment, Mod assist Stand pivot transfers: Mod assist, +2 physical assistance, +2 safety/equipment General transfer comment: some lifting help from EOB Ambulation/Gait Ambulation/Gait  assistance: Mod assist, +2 safety/equipment Gait Distance (Feet): 60 Feet Assistive device: Rolling walker (2 wheeled), 2 person hand held assist Gait Pattern/deviations: Decreased stride length, Drifts right/left, Trunk flexed General Gait Details: initially with RW and 2 assist for safety  due to IV pole and pt not keeping L hand on walker nor going forward in straight path veering to the R; then pt anxious and fearful of falling and not able to keep walker straight so removed walker to use bilateral HHA for easier assistance, management.    ADL: ADL Overall ADL's : Needs assistance/impaired Eating/Feeding: Supervision/ safety, Sitting(mod cues ) Eating/Feeding Details (indicate cue type and reason): assist for set up and cues to locate food items  Grooming: Wash/dry hands, Wash/dry face, Oral care, Brushing hair, Moderate assistance, Standing Upper Body Bathing: Moderate assistance, Sitting Lower Body Bathing: Maximal assistance, Sit to/from stand Upper Body Dressing : Moderate assistance, Sitting Lower Body Dressing: Total assistance, Sit to/from stand Lower Body Dressing Details (indicate cue type and reason): Pt attempted to don socks, but unable to access feet, and frequently dropped sock when holding it in Lt UE  Toilet Transfer: Moderate assistance, +2 for physical assistance, +2 for safety/equipment, Ambulation, Comfort height toilet, RW Toileting- Clothing Manipulation and Hygiene: Maximal assistance, Sit to/from stand Functional mobility during ADLs: Moderate assistance, +2 for physical assistance, +2 for safety/equipment, Rolling walker  Cognition: Cognition Overall Cognitive Status: Impaired/Different from baseline Arousal/Alertness: Awake/alert Orientation Level: Oriented X4 Attention: Focused, Sustained Focused Attention: Appears intact Sustained Attention: Appears intact Memory: Impaired Memory Impairment: Decreased long term memory Decreased Long Term Memory: Verbal  basic Awareness: Impaired Awareness Impairment: Intellectual impairment, Emergent impairment Problem Solving: Impaired Problem Solving Impairment: Functional basic Cognition Arousal/Alertness: Awake/alert Behavior During Therapy: Anxious, WFL for tasks assessed/performed Overall Cognitive Status: Impaired/Different from baseline Area of Impairment: Attention, Memory, Following commands, Safety/judgement, Awareness, Problem solving Current Attention Level: Sustained Memory: Decreased short-term memory Following Commands: Follows one step commands with increased time Safety/Judgement: Decreased awareness of safety, Decreased awareness of deficits Awareness: Intellectual Problem Solving: Slow processing, Difficulty sequencing, Requires verbal cues, Requires tactile cues General Comments: Pt self distracts frequently requiring mod cues for selective attention.  Cues provided for sequencing and problem solving    Blood pressure (!) 156/59, pulse 88, temperature 98.4 F (36.9 C), resp. rate (!) 22, SpO2 99 %. Physical Exam  Nursing note and vitals reviewed. Constitutional: She is oriented to person, place, and time. She appears well-developed and well-nourished.  Sitting up in chair at bedside; son on phone in room, appropriate, but although can answer questions, is tangential, NAD  HENT:  Head: Normocephalic and atraumatic.  Nose: Nose normal.  Mouth/Throat: Oropharynx is clear and moist. No oropharyngeal exudate.  Smile appeared equal Tongue midline- not coated  Eyes: Conjunctivae are normal. Right eye exhibits no discharge. Left eye exhibits no discharge.  EOM very slow, but can go all the was right and left with cues No nystagmus seen  Cardiovascular:  RRR- HR 69-73 while in room  Respiratory: No stridor.  CTA B/L- no W/R/R- good air movement sats 96% on RA- RR-17  GI:  Soft, NT, ND, (+)BS    Genitourinary:    Genitourinary Comments: Foley appeared to be in place    Musculoskeletal:     Cervical back: Normal range of motion and neck supple.     Comments: LUE- 4/5 in biceps, triceps, WE< grip and finger abd LLE- 4/5 in HF, KE, DF and PF R UE and RLE 5-/5 in same muscles    Neurological: She is alert and oriented to person, place, and time.  Speech clear. Right gaze preference with left inattention Pt had mild apraxia- was able to cross midline, but didn't reach for the correct body part  when asked Decreased vision in periphery L>R   Skin: Skin is warm and dry. No erythema.  Psychiatric:  Appropriate, tangential    Results for orders placed or performed during the hospital encounter of 06/18/19 (from the past 24 hour(s))  Glucose, capillary     Status: Abnormal   Collection Time: 06/19/19 11:25 AM  Result Value Ref Range   Glucose-Capillary 167 (H) 70 - 99 mg/dL   Comment 1 Notify RN    Comment 2 Document in Chart   Glucose, capillary     Status: Abnormal   Collection Time: 06/19/19  5:03 PM  Result Value Ref Range   Glucose-Capillary 157 (H) 70 - 99 mg/dL   Comment 1 Notify RN    Comment 2 Document in Chart   Glucose, capillary     Status: Abnormal   Collection Time: 06/19/19  9:21 PM  Result Value Ref Range   Glucose-Capillary 162 (H) 70 - 99 mg/dL   Comment 1 Notify RN    Comment 2 Document in Chart   CBC     Status: Abnormal   Collection Time: 06/20/19  7:26 AM  Result Value Ref Range   WBC 6.5 4.0 - 10.5 K/uL   RBC 3.17 (L) 3.87 - 5.11 MIL/uL   Hemoglobin 10.6 (L) 12.0 - 15.0 g/dL   HCT 31.9 (L) 36.0 - 46.0 %   MCV 100.6 (H) 80.0 - 100.0 fL   MCH 33.4 26.0 - 34.0 pg   MCHC 33.2 30.0 - 36.0 g/dL   RDW 11.4 (L) 11.5 - 15.5 %   Platelets 236 150 - 400 K/uL   nRBC 0.0 0.0 - 0.2 %  Basic metabolic panel     Status: Abnormal   Collection Time: 06/20/19  7:26 AM  Result Value Ref Range   Sodium 138 135 - 145 mmol/L   Potassium 4.2 3.5 - 5.1 mmol/L   Chloride 107 98 - 111 mmol/L   CO2 20 (L) 22 - 32 mmol/L   Glucose, Bld  184 (H) 70 - 99 mg/dL   BUN 15 8 - 23 mg/dL   Creatinine, Ser 1.23 (H) 0.44 - 1.00 mg/dL   Calcium 8.4 (L) 8.9 - 10.3 mg/dL   GFR calc non Af Amer 41 (L) >60 mL/min   GFR calc Af Amer 47 (L) >60 mL/min   Anion gap 11 5 - 15  Lipid panel     Status: Abnormal   Collection Time: 06/20/19  7:26 AM  Result Value Ref Range   Cholesterol 143 0 - 200 mg/dL   Triglycerides 191 (H) <150 mg/dL   HDL 29 (L) >40 mg/dL   Total CHOL/HDL Ratio 4.9 RATIO   VLDL 38 0 - 40 mg/dL   LDL Cholesterol 76 0 - 99 mg/dL  Glucose, capillary     Status: Abnormal   Collection Time: 06/20/19  7:34 AM  Result Value Ref Range   Glucose-Capillary 167 (H) 70 - 99 mg/dL   CT Head Wo Contrast  Result Date: 06/18/2019 CLINICAL DATA:  Headache with nausea EXAM: CT HEAD WITHOUT CONTRAST TECHNIQUE: Contiguous axial images were obtained from the base of the skull through the vertex without intravenous contrast. COMPARISON:  March 17, 2018 FINDINGS: Brain: Ventricles and sulci are normal in size and configuration. There is a focal apparent hemorrhagic infarct arising in the mid right occipital lobe with mild surrounding edema. This focus of hemorrhage measures 3.0 x 2.6 x 4.0 cm. No other hemorrhage evident. No well-defined mass. No extra-axial  fluid collection or midline shift. There is patchy small vessel disease in the centra semiovale bilaterally. Vascular: No hyperdense vessel evident. Foci of calcification noted in each carotid siphon region. Skull: Bony calvarium appears intact. Sinuses/Orbits: Mucosal thickening noted in each maxillary antrum inferiorly. There are retention cysts in the right maxillary antrum. There is mucosal thickening in several ethmoid air cells. Orbits appear symmetric bilaterally. Other: Mastoid air cells are clear. IMPRESSION: Focal hemorrhage, presumably from hemorrhagic infarct in the mid occipital lobe measuring 3.0 x 2.6 x 4.0 cm. Mild surrounding cytotoxic appearing edema. No other hemorrhage  evident. Elsewhere there is small vessel disease within portions of the centra semiovale bilaterally. Foci of arterial vascular calcification noted. Foci of paranasal sinus disease noted at several sites. Critical Value/emergent results were called by telephone at the time of interpretation on 06/18/2019 at 12:09 pm to provider [facility] , who verbally acknowledged these results. Electronically Signed   By: Lowella Grip III M.D.   On: 06/18/2019 12:10   MR BRAIN WO CONTRAST  Result Date: 06/18/2019 CLINICAL DATA:  Intracranial hemorrhage. Mild cognitive decline last 1-2 years. Headaches. EXAM: MRI HEAD WITHOUT CONTRAST TECHNIQUE: Multiplanar, multiecho pulse sequences of the brain and surrounding structures were obtained without intravenous contrast. COMPARISON:  CT head 06/18/2019.  MRI head 03/16/2018 FINDINGS: Brain: Hematoma right posterior temporoparietal lobe measuring approximately 2.5 x 2.9 cm. There is methemoglobin within the hematoma suggesting this may have been present for several days. There is surrounding edema. No underlying mass identified however intravenous contrast was not administered. Hemorrhage extends into the subdural space with a small right-sided subdural hematoma measuring approximately 3 mm in thickness. No significant midline shift. No hemorrhage was seen in this area on the prior MRI. There are a few scattered areas of chronic microhemorrhage including the left frontal lobe and left occipital parietal lobe. These may be related to the patient's known severe hypertension. Negative for acute ischemic infarct. Moderate chronic microvascular ischemic change in the white matter. Ventricle size and cerebral volume normal. Vascular: Normal arterial flow voids. Skull and upper cervical spine: Negative Sinuses/Orbits: Mucosal edema paranasal sinuses. Bilateral cataract extraction Other: None IMPRESSION: Hematoma right posterior temporoparietal lobe. This contains methemoglobin  suggesting it may have been present for several days. There is extension into the subdural space with a small right-sided subdural hematoma. No midline shift. Negative for acute infarct. Moderate chronic microvascular ischemic change Small areas of chronic microhemorrhage left frontal lobe and left parietal lobe. Findings suggest hypertensive bleed. Electronically Signed   By: Franchot Gallo M.D.   On: 06/18/2019 16:36   Chest Port 1 View  Result Date: 06/18/2019 CLINICAL DATA:  Intracerebral hemorrhage. EXAM: PORTABLE CHEST 1 VIEW COMPARISON:  06/06/2019 FINDINGS: Heart size is within normal limits. Low lung volumes are noted, however both lungs are clear. Cervical spine fusion hardware again noted. IMPRESSION: Low lung volumes. No active disease. Electronically Signed   By: Marlaine Hind M.D.   On: 06/18/2019 14:33   ECHOCARDIOGRAM COMPLETE  Result Date: 06/19/2019    ECHOCARDIOGRAM REPORT   Patient Name:   ANZLEE HINESLEY Date of Exam: 06/19/2019 Medical Rec #:  [id number]     Height:       60.0 in Accession #:    [id number]    Weight:       147.9 lb Date of Birth:  [birth year]    BSA:          1.642 m Patient Age:    68 years  BP:           137/87 mmHg Patient Gender: F             HR:           90 bpm. Exam Location:  Inpatient Procedure: 2D Echo Indications:    stroke 434.91  History:        Patient has prior history of Echocardiogram examinations, most                 recent 03/19/2018. CAD, chronic kidney disease.; Risk                 Factors:Diabetes and Dyslipidemia.  Sonographer:    [name] Referring Phys: [place]  1. Left ventricular ejection fraction, by estimation, is 60 to 65%. The left ventricle has normal function. Left ventricular endocardial border not optimally defined to evaluate regional wall motion. Left ventricular diastolic parameters are consistent with Grade I diastolic dysfunction (impaired relaxation).  2. Right ventricular systolic function is  normal. The right ventricular size is normal. Tricuspid regurgitation signal is inadequate for assessing PA pressure.  3. The mitral valve is grossly normal. No evidence of mitral valve regurgitation. No evidence of mitral stenosis.  4. The aortic valve is abnormal. Aortic valve regurgitation is trivial. Mild to moderate aortic valve sclerosis/calcification is present, without any evidence of aortic stenosis.  5. The inferior vena cava is normal in size with greater than 50% respiratory variability, suggesting right atrial pressure of 3 mmHg. Conclusion(s)/Recommendation(s): No intracardiac source of embolism detected on this transthoracic study. A transesophageal echocardiogram is recommended to exclude cardiac source of embolism if clinically indicated. FINDINGS  Left Ventricle: Left ventricular ejection fraction, by estimation, is 60 to 65%. The left ventricle has normal function. Left ventricular endocardial border not optimally defined to evaluate regional wall motion. The left ventricular internal cavity size was normal in size. There is no left ventricular hypertrophy. Left ventricular diastolic parameters are consistent with Grade I diastolic dysfunction (impaired relaxation). Right Ventricle: The right ventricular size is normal. No increase in right ventricular wall thickness. Right ventricular systolic function is normal. Tricuspid regurgitation signal is inadequate for assessing PA pressure. Left Atrium: Left atrial size was normal in size. Right Atrium: Right atrial size was normal in size. Pericardium: There is no evidence of pericardial effusion. Presence of pericardial fat pad. Mitral Valve: The mitral valve is grossly normal. Normal mobility of the mitral valve leaflets. No evidence of mitral valve regurgitation. No evidence of mitral valve stenosis. Tricuspid Valve: The tricuspid valve is normal in structure. Tricuspid valve regurgitation is trivial. No evidence of tricuspid stenosis. Aortic Valve:  The aortic valve is abnormal. Aortic valve regurgitation is trivial. Mild to moderate aortic valve sclerosis/calcification is present, without any evidence of aortic stenosis. There is moderate calcification of the aortic valve. Aortic valve mean gradient measures 7.0 mmHg. Pulmonic Valve: The pulmonic valve was grossly normal. Pulmonic valve regurgitation is trivial. No evidence of pulmonic stenosis. Aorta: The aortic root is normal in size and structure. Venous: The inferior vena cava is normal in size with greater than 50% respiratory variability, suggesting right atrial pressure of 3 mmHg. IAS/Shunts: The interatrial septum was not well visualized.  LEFT VENTRICLE PLAX 2D LVIDd:         4.10 cm  Diastology LVIDs:         3.00 cm  LV e' lateral:   9.03 cm/s LV PW:  1.10 cm  LV E/e' lateral: 12.6 LV IVS:        1.10 cm  LV e' medial:    6.42 cm/s LVOT diam:     1.50 cm  LV E/e' medial:  17.8 LV SV:         55 LV SV Index:   33 LVOT Area:     1.77 cm  RIGHT VENTRICLE             IVC RV S prime:     21.40 cm/s  IVC diam: 2.10 cm LEFT ATRIUM             Index       RIGHT ATRIUM           Index LA diam:        4.00 cm 2.44 cm/m  RA Area:     11.40 cm LA Vol (A2C):   43.2 ml 26.31 ml/m RA Volume:   23.50 ml  14.31 ml/m LA Vol (A4C):   40.3 ml 24.54 ml/m LA Biplane Vol: 43.1 ml 26.25 ml/m  AORTIC VALVE AV Mean Grad: 7.0 mmHg LVOT Vmax:    134.00 cm/s LVOT Vmean:   92.800 cm/s LVOT VTI:     0.310 m  AORTA Ao Asc diam: 3.00 cm MITRAL VALVE MV Area (PHT): 3.27 cm     SHUNTS MV Decel Time: 232 msec     Systemic VTI:  0.31 m MV E velocity: 114.00 cm/s  Systemic Diam: 1.50 cm MV A velocity: 122.00 cm/s MV E/A ratio:  0.93 Cherlynn Kaiser MD Electronically signed by Cherlynn Kaiser MD Signature Date/Time: 06/19/2019/4:03:15 PM    Final      Assessment/Plan: Diagnosis: R ICH/hypertensive bleeding with L neglect, mild hemiparesis and visual deficits 1. Does the need for close, 24 hr/day medical supervision in  concert with the patient's rehab needs make it unreasonable for this patient to be served in a less intensive setting? Potentially 2. Co-Morbidities requiring supervision/potential complications: HTN, MI, CKD stage III, DM, mild cognitive deficits, depression, previous CVA 3. Due to bowel management, safety, skin/wound care, disease management, medication administration and patient education, does the patient require 24 hr/day rehab nursing? Yes 4. Does the patient require coordinated care of a physician, rehab nurse, therapy disciplines of PT, OT, SLP to address physical and functional deficits in the context of the above medical diagnosis(es)? Yes Addressing deficits in the following areas: balance, endurance, locomotion, strength, transferring, bathing, dressing, feeding, grooming, toileting, cognition and swallowing 5. Can the patient actively participate in an intensive therapy program of at least 3 hrs of therapy per day at least 5 days per week? Yes 6. The potential for patient to make measurable gains while on inpatient rehab is good and fair 7. Anticipated functional outcomes upon discharge from inpatient rehab are supervision and min assist  with PT, modified independent, supervision and min assist with OT, modified independent and supervision with SLP. 8. Estimated rehab length of stay to reach the above functional goals is: 2-2.5 weeks 9. Anticipated discharge destination: Home 10. Overall Rehab/Functional Prognosis: good and fair  RECOMMENDATIONS: This patient's condition is appropriate for continued rehabilitative care in the following setting: CIR Patient has agreed to participate in recommended program. Potentially Note that insurance prior authorization may be required for reimbursement for recommended care.  Comment:  1. Pt has sustained a R ICH/hypertensive bleed with L hemiparesis, visual field deficits and apraxia/L neglect 2. Needs reminders to use L side 3. A1c is 8.6-  primary team addressing 4. Will submit for insurance approval for CIR- although pt is 86 yr old, she was driving and independent prior to stroke, per chart.  5. Thank you for this consult- will follow remotely for admission.     Bary Leriche, PA-C 06/20/2019    I have personally performed a face to face diagnostic evaluation of this patient and formulated the key components of the plan.  Additionally, I have personally reviewed laboratory data, imaging studies, as well as relevant notes and concur with the physician assistant's documentation above.

## 2019-06-19 NOTE — Progress Notes (Signed)
Rehab Admissions Coordinator Note:  Patient was screened by Cleatrice Burke for appropriateness for an Inpatient Acute Rehab Consult per PT and OT recs. .  At this time, we are recommending Inpatient Rehab consult. I will place order per protocol.  Cleatrice Burke RN MSN 06/19/2019, 2:39 PM  I can be reached at [phone].

## 2019-06-20 ENCOUNTER — Encounter: Payer: Self-pay | Admitting: Adult Health

## 2019-06-20 ENCOUNTER — Inpatient Hospital Stay (HOSPITAL_COMMUNITY): Payer: Medicare HMO

## 2019-06-20 ENCOUNTER — Ambulatory Visit: Payer: Medicare HMO | Admitting: Adult Health

## 2019-06-20 DIAGNOSIS — Z794 Long term (current) use of insulin: Secondary | ICD-10-CM

## 2019-06-20 DIAGNOSIS — G936 Cerebral edema: Secondary | ICD-10-CM

## 2019-06-20 DIAGNOSIS — I161 Hypertensive emergency: Secondary | ICD-10-CM

## 2019-06-20 DIAGNOSIS — E7800 Pure hypercholesterolemia, unspecified: Secondary | ICD-10-CM

## 2019-06-20 DIAGNOSIS — I611 Nontraumatic intracerebral hemorrhage in hemisphere, cortical: Secondary | ICD-10-CM

## 2019-06-20 DIAGNOSIS — R4189 Other symptoms and signs involving cognitive functions and awareness: Secondary | ICD-10-CM

## 2019-06-20 DIAGNOSIS — N1831 Chronic kidney disease, stage 3a: Secondary | ICD-10-CM

## 2019-06-20 DIAGNOSIS — E1159 Type 2 diabetes mellitus with other circulatory complications: Secondary | ICD-10-CM

## 2019-06-20 LAB — BASIC METABOLIC PANEL
Anion gap: 11 (ref 5–15)
BUN: 15 mg/dL (ref 8–23)
CO2: 20 mmol/L — ABNORMAL LOW (ref 22–32)
Calcium: 8.4 mg/dL — ABNORMAL LOW (ref 8.9–10.3)
Chloride: 107 mmol/L (ref 98–111)
Creatinine, Ser: 1.23 mg/dL — ABNORMAL HIGH (ref 0.44–1.00)
GFR calc Af Amer: 47 mL/min — ABNORMAL LOW (ref >60)
GFR calc non Af Amer: 41 mL/min — ABNORMAL LOW (ref >60)
Glucose, Bld: 184 mg/dL — ABNORMAL HIGH (ref 70–99)
Potassium: 4.2 mmol/L (ref 3.5–5.1)
Sodium: 138 mmol/L (ref 135–145)

## 2019-06-20 LAB — LIPID PANEL
Cholesterol: 143 mg/dL (ref 0–200)
HDL: 29 mg/dL — ABNORMAL LOW (ref >40)
LDL Cholesterol: 76 mg/dL (ref 0–99)
Total CHOL/HDL Ratio: 4.9 RATIO
Triglycerides: 191 mg/dL — ABNORMAL HIGH (ref <150)
VLDL: 38 mg/dL (ref 0–40)

## 2019-06-20 LAB — GLUCOSE, CAPILLARY
Glucose-Capillary: 167 mg/dL — ABNORMAL HIGH (ref 70–99)
Glucose-Capillary: 178 mg/dL — ABNORMAL HIGH (ref 70–99)
Glucose-Capillary: 237 mg/dL — ABNORMAL HIGH (ref 70–99)
Glucose-Capillary: 134 mg/dL — ABNORMAL HIGH (ref 70–99)

## 2019-06-20 LAB — CBC
HCT: 31.9 % — ABNORMAL LOW (ref 36.0–46.0)
Hemoglobin: 10.6 g/dL — ABNORMAL LOW (ref 12.0–15.0)
MCH: 33.4 pg (ref 26.0–34.0)
MCHC: 33.2 g/dL (ref 30.0–36.0)
MCV: 100.6 fL — ABNORMAL HIGH (ref 80.0–100.0)
Platelets: 236 10*3/uL (ref 150–400)
RBC: 3.17 MIL/uL — ABNORMAL LOW (ref 3.87–5.11)
RDW: 11.4 % — ABNORMAL LOW (ref 11.5–15.5)
WBC: 6.5 10*3/uL (ref 4.0–10.5)
nRBC: 0 % (ref 0.0–0.2)

## 2019-06-20 MED ORDER — QUETIAPINE FUMARATE 25 MG PO TABS
25.0000 mg | ORAL_TABLET | Freq: Every day | ORAL | Status: DC
  Administered 2019-06-20 – 2019-06-21 (×2): 25 mg via ORAL
  Filled 2019-06-20 (×2): qty 1

## 2019-06-20 MED ORDER — LABETALOL HCL 5 MG/ML IV SOLN
5.0000 mg | INTRAVENOUS | Status: DC | PRN
  Administered 2019-06-20 (×2): 20 mg via INTRAVENOUS
  Filled 2019-06-20 (×2): qty 4

## 2019-06-20 MED ORDER — QUETIAPINE FUMARATE 25 MG PO TABS: 25.0000 mg | ORAL_TABLET | Freq: Every day | ORAL | Status: DC

## 2019-06-20 MED ORDER — LISINOPRIL 20 MG PO TABS
20.0000 mg | ORAL_TABLET | Freq: Every day | ORAL | Status: DC
  Administered 2019-06-20 – 2019-06-22 (×3): 20 mg via ORAL
  Filled 2019-06-20 (×3): qty 1

## 2019-06-20 NOTE — Progress Notes (Deleted)
[facility] [address] [place]. [place] [address] [phone]       OFFICE CONSULT NOTE  [name] Date of Birth:  [birth year] Medical Record Number:  [id number]   Referring MD: Anice Paganini, PA-C Reason for Referral: TIA HPI: [name] is a 86 year old pleasant Caucasian lady seen today for initial office consultation visit for TIA.  History is obtained from the patient, review of electronic medical records and I personally reviewed imaging films in PACS.  She presented to [facility]17[facility]2018 with transient slurred speech left-sided weakness and numbness in apparently facial droop but the symptoms resolved by the time EMS was bringing her symptoms recurred again and code stroke was called in route.  Patient arrived with blood pressure significantly elevated at 222/73 she complained of right supraorbital headache and after blood pressure was controlled with Cleviprex drip symptoms resolved.  MRI scan of the brain shows a questionable right occipital cortical diffusion hyperintensity which radiologist was not sure was an actual infarct or artifact but I was unable to view these images personally as the images were not opening in PACS.  She subsequently stated that she had not taken her blood pressure medication for the past 2 days.  Today she tells me that she actually had numbness around her right lip and subsequently the numbness spread down her right arm as well as down into the right leg over a minute or so.  She also described to me today that her right hand was drawn and crampy.  This seems to have happened a few other times and she is scared that this may be beginning of a TIA or stroke but it has not progressed.  The patient does have a remote history of left parietal convexity subarachnoid hemorrhage in 2012 for which actually I saw her and was felt to be reversible cerebral vasoconstriction syndrome.  At that time she had had some paresthesias  affecting the right face and leg.  EEG was normal but she was put on a trial of Depakote for both headache and sensory seizures and she did well and on follow-up visit with me Depakote was stopped.  She had done well until recently.  Patient denies any altered awareness or headache but she is not a good historian.  She denies any other symptoms of stroke or TIA.  She does have significant vascular risk factors of hypertension, hyperlipidemia and diabetes.  She is currently on aspirin 81 mg which is tolerating well without bleeding or bruising.  She states her blood pressure is typically under good control it runs in the 546 systolic range and today it is 140/64 in office.  Patient was recommended to wear a 30-day heart monitor upon discharge but she was confused about directions and cardiology office called her and said that it will come in the mail.  She prefers to go in person to the cardiology office to get have it placed on her chest.  She has had no recurrent stroke or TIA symptoms.  ROS:   14 system review of systems is positive for numbness, tingling, depression, anxiety, snoring, wheezing, shortness of breath, runny nose, itching and all other systems negative  PMH:  Past Medical History:  Diagnosis Date  . Abnormal blood finding 02/10/2016  . Abrasion of ear canal 09/28/2012  . Achilles tendon injury 11/27/2010  . ACID REFLUX DISEASE 05/12/2006   Qualifier: Diagnosis of  By: Larose Kells MD, [place] Acute MI ([place])   .  ALLERGIC RHINITIS 05/12/2006   Qualifier: Diagnosis of  By: Larose Kells MD, [place] Anxiety state 07/03/2009   Qualifier: Diagnosis of  By: Larose Kells MD, [place] pain 07/01/2010  . Chronic kidney disease (CKD), stage III (moderate) ([place]) 05/31/2014  . Chronic right SI joint pain 09/22/2013  . DEGENERATIVE JOINT DISEASE, CERVICAL SPINE 06/23/2006   Annotation: had a CAT scan with a cervical myelogram that show  left C6-7 and  C5-6 foraminal stenosis Qualifier: Diagnosis of  By: Larose Kells MD, [place] DEPRESSION 05/12/2006   Qualifier: Diagnosis of  By: Larose Kells MD, [place] DIABETES MELLITUS, TYPE II 05/12/2006   Now following w/ Endo at [place] 04/27/2007   Qualifier: Diagnosis of  By: Jerold Coombe    . Dyspnea 02/10/2016  . Encephalopathy, hypertensive   . Essential hypertension 05/12/2006   Qualifier: Diagnosis of  By: Larose Kells MD, [place] Fatigue 05/31/2014  . GAIT DISTURBANCE 08/07/2009   Qualifier: Diagnosis of  By: Larose Kells MD, [place] GERD (gastroesophageal reflux disease)   . Headache(784.0) 11/27/2010  . Hyperlipidemia   . Hyperlipidemia associated with type 2 diabetes mellitus ([place]) 05/12/2006   Qualifier: Diagnosis of  By: Larose Kells MD, [place] ILD (interstitial lung disease) ([place]) 05/31/2015  . Leukocytopenia 05/31/2014  . Lower abdominal pain 05/17/2014  . Nausea with vomiting 09/22/2013  . NECK PAIN, CHRONIC 04/03/2008   Qualifier: Diagnosis of  By: Larose Kells MD, [place] Nodule on liver 12/06/2015  . Osteoarthritis 02/10/2016  . Osteopenia   . Osteoporosis 06/23/2006   Annotation: had a bone density test in 08-2004.  T score was -2.4 Qualifier: Diagnosis of  By: Larose Kells MD, [place] SAH (subarachnoid hemorrhage) ([place])   . Sciatica   . Scleroderma ([place]) 02/28/2016  . Severe hypertension 03/18/2018  . Slurred speech 11/27/2010  . Takotsubo cardiomyopathy    s/p stress MI with stress induced CM with normal coronary arteries by cath 2007 with normalization of LVF by echo 04/2005  . TIA (transient ischemic attack)   . Unspecified cerebral artery occlusion with cerebral infarction 04/07/2012  . UTI (urinary tract infection) 11/02/2011  . 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 Vitamin D deficiency 10/01/2014    Social History:  Social History   Socioeconomic History  . Marital status: Divorced    Spouse name: Not on file  . Number of children: 2  . Years of education: Not on file  . Highest education level: Not on file  Occupational History  .  Occupation: Retired    Comment: Press photographer  Tobacco Use  . Smoking status: Passive Smoke Exposure - Never Smoker  . Smokeless tobacco: Never Used  . Tobacco comment: Mother & Children  Substance and Sexual Activity  . Alcohol use: Yes    Alcohol/week: 1.0 standard drinks    Types: 1 Glasses of wine per week    Comment: occ  . Drug use: No  . Sexual activity: Never  Other Topics Concern  . Not on file  Social History Narrative   Lives with her daughter and G-daughter   Diet- working on portion control   Exercise- no routine exercise but active      [place] [facility]:   Originally from [place]. Always lived in [place]. Previously has traveled to [place], [place], [place], [place], & [place].  Previously did accounting and clerical work. Has a dog currently. Remote exposure to a parakeet. No mold exposure.    Social Determinants of Health   Financial Resource Strain:   . Difficulty of Paying Living Expenses:   Food Insecurity:   . Worried About Charity fundraiser in the Last Year:   . Arboriculturist in the Last Year:   Transportation Needs:   . Film/video editor (Medical):   Marland Kitchen Lack of Transportation (Non-Medical):   Physical Activity:   . Days of Exercise per Week:   . Minutes of Exercise per Session:   Stress:   . Feeling of Stress :   Social Connections:   . Frequency of Communication with Friends and Family:   . Frequency of Social Gatherings with Friends and Family:   . Attends Religious Services:   . Active Member of Clubs or Organizations:   . Attends Archivist Meetings:   Marland Kitchen Marital Status:   Intimate Partner Violence:   . Fear of Current or Ex-Partner:   . Emotionally Abused:   Marland Kitchen Physically Abused:   . Sexually Abused:     Medications:   Current Facility-Administered Medications on File Prior to Visit  Medication Dose Route Frequency Provider Last Rate Last Admin  . 0.9 %  sodium chloride infusion   Intravenous Continuous Rosalin Hawking, MD 10 mL/hr at 06/20/19  0900 Rate Verify at 06/20/19 0900  . acetaminophen (TYLENOL) tablet 650 mg  650 mg Oral Q4H PRN Metzger-Cihelka, Desiree, NP   650 mg at 06/19/19 2319   Or  . acetaminophen (TYLENOL) 160 MG/5ML solution 650 mg  650 mg Per Tube Q4H PRN Metzger-Cihelka, Desiree, NP       Or  . acetaminophen (TYLENOL) suppository 650 mg  650 mg Rectal Q4H PRN Metzger-Cihelka, Desiree, NP      . Chlorhexidine Gluconate Cloth 2 % PADS 6 each  6 each Topical Daily Aroor, Lanice Schwab, MD   6 each at 06/19/19 1717  . clevidipine (CLEVIPREX) infusion 0.5 mg/mL  0-21 mg/hr Intravenous Continuous Virgel Manifold, MD 8 mL/hr at 06/20/19 0900 4 mg/hr at 06/20/19 0900  . clonazePAM (KLONOPIN) disintegrating tablet 0.5 mg  0.5 mg Oral BID Sarina Ill B, MD   0.5 mg at 06/19/19 2112  . insulin aspart (novoLOG) injection 0-15 Units  0-15 Units Subcutaneous TID WC Greta Doom, MD   3 Units at 06/20/19 0819  . labetalol (NORMODYNE) injection 5-20 mg  5-20 mg Intravenous Q2H PRN Rosalin Hawking, MD      . lisinopril (ZESTRIL) tablet 20 mg  20 mg Oral Daily Rosalin Hawking, MD      . metoprolol succinate (TOPROL-XL) 24 hr tablet 50 mg  50 mg Oral Daily Sarina Ill B, MD   50 mg at 06/19/19 1550  . ondansetron (ZOFRAN) tablet 4 mg  4 mg Oral Q6H PRN Melvenia Beam, MD       Or  . ondansetron [facility]) injection 4 mg  4 mg Intravenous Q6H PRN Melvenia Beam, MD   4 mg at 06/19/19 1238  . pantoprazole (PROTONIX) EC tablet 40 mg  40 mg Oral QHS Melvenia Beam, MD   40 mg at 06/19/19 2112  . senna-docusate (Senokot-S) tablet 1 tablet  1 tablet Oral BID Metzger-Cihelka, Desiree, NP   1 tablet at 06/19/19 2112  . sertraline (ZOLOFT) tablet 50 mg  50 mg Oral Daily Sarina Ill B, MD   50 mg at 06/19/19 1550  Current Outpatient Medications on File Prior to Visit  Medication Sig Dispense Refill  . ACCU-CHEK FASTCLIX LANCETS MISC     . ACCU-CHEK SMARTVIEW test strip     . aspirin 81 MG tablet Take 81 mg by mouth daily.    Marland Kitchen  atorvastatin (LIPITOR) 40 MG tablet Take 1 tablet (40 mg total) by mouth daily at 6 PM. 30 tablet 0  . b complex vitamins tablet Take 1 tablet by mouth daily.    . Cholecalciferol (VITAMIN D) 125 MCG (5000 UT) CAPS Take 5,000 Units by mouth daily.    . clonazePAM (KLONOPIN) 0.5 MG tablet TAKE 1 TABLET TWICE DAILY AS NEEDED FOR ANXIETY (Patient taking differently: Take 0.5 mg by mouth 2 (two) times daily as needed for anxiety. ) 90 tablet 1  . Cyanocobalamin (VITAMIN B-12 PO) Take 1 tablet by mouth daily.     . famotidine (PEPCID) 40 MG tablet Take 40 mg by mouth daily.     . fenofibrate 54 MG tablet Take 54 mg by mouth daily.    Marland Kitchen glimepiride (AMARYL) 4 MG tablet Take 2 mg by mouth 2 (two) times daily.   2  . HYDROcodone-acetaminophen (NORCO/VICODIN) 5-325 MG tablet Take 1 tablet by mouth every 6 (six) hours as needed for moderate pain. (Patient not taking: Reported on 06/18/2019) 45 tablet 0  . Insulin NPH, Human,, Isophane, (HUMULIN N) 100 UNIT/ML Kiwkpen Inject into the skin. Take 36 units am and 6 units pm    . insulin regular (NOVOLIN R) 100 units/mL injection Inject 23-27 Units into the skin See admin instructions. 23 units before Breakfast and lunch, 27 units before dinner.    . Insulin Syringe-Needle U-100 (INSULIN SYRINGE .5CC/31GX5/16") 31G X 5/16" 0.5 ML MISC 40 units in morning and 18units at night    . lisinopril (PRINIVIL,ZESTRIL) 10 MG tablet Take 10 mg by mouth daily.     . metoprolol succinate (TOPROL-XL) 50 MG 24 hr tablet TAKE 1 TABLET EVERY DAY.  TAKE  WITH  OR  IMMEDIATELY FOLLOWING A MEAL  (Patient taking differently: Take 50 mg by mouth daily. ) 90 tablet 1  . Nintedanib (OFEV) 100 MG CAPS Take 1 capsule (100 mg total) by mouth 2 (two) times daily after a meal. (Patient not taking: Reported on 04/24/2019) 60 capsule 5  . sertraline (ZOLOFT) 50 MG tablet Take 50 mg by mouth daily.    . Turmeric 500 MG CAPS Take 500 mg by mouth daily.    . vitamin E (VITAMIN E) 180 MG (400 UNITS)  capsule Take 400 Units by mouth daily.      Allergies:   Allergies  Allergen Reactions  . Cephalexin Nausea And Vomiting    Pt stated made severely sick, will never take again  . Nintedanib Diarrhea  . Pioglitazone Other (See Comments)    REACTION: EDEMA    Physical Exam General: well developed, well nourished elderly Caucasian lady seated, in no evident distress Head: head normocephalic and atraumatic.   Neck: supple with no carotid or supraclavicular bruits Cardiovascular: regular rate and rhythm, no murmurs Musculoskeletal: no deformity Skin:  no rash/petichiae Vascular:  Normal pulses all extremities  Neurologic Exam Mental Status: Awake and fully alert. Oriented to place and time. Recent and remote memory intact. Attention span, concentration and fund of knowledge appropriate. Mood and affect appropriate.  Cranial Nerves: Fundoscopic exam reveals sharp disc margins. Pupils equal, briskly reactive to light. Extraocular movements full without nystagmus. Visual fields full to confrontation. Hearing intact. Facial  sensation intact. Face, tongue, palate moves normally and symmetrically.  Motor: Normal bulk and tone. Normal strength in all tested extremity muscles. Sensory.: intact to touch , pinprick , position and vibratory sensation.  Coordination: Rapid alternating movements normal in all extremities. Finger-to-nose and heel-to-shin performed accurately bilaterally. Gait and Station: Arises from chair without difficulty. Stance is normal. Gait demonstrates normal stride length and balance . Able to heel, toe and tandem walk with moderate difficulty.  Reflexes: 1+ and symmetric. Toes downgoing.      ASSESSMENT: 86 year old Caucasian lady with transient episode of face and right body paresthesias along with right hand drawing up  in February 2020 possibly TIA though sensory seizure or complicated migraine is also possible..  She has remote history of left parietal convexity  hemorrhage due to RCVS in 2012 and question of  seizures but had done well since then.     PLAN: I had a long discussion with the patient regarding her episode of transient face and right body paresthesias and possible TIA though complicated migraine or sensory seizure is less likely.  I recommend checking EEG for seizure activity.  Continue aspirin for stroke prevention with strict control of diabetes with hemoglobin A1c goal below 6.5%, lipids with LDL cholesterol goal below 70 mg percent and hypertension with blood pressure goal below 130/90.  Patient will get 30-day heart monitor which was recommended but has not been done yet to look for cardiac arrhythmias.  Greater than 50% time during this 50-minute consultation visit was spent on counseling and coordination of care about her episode of paresthesias and TIA and answering questions she will return for follow-up in the future in 3 months with my nurse practitioner Janett Billow or call earlier if necessary.  Frann Rider, AGNP-BC  [facility] [address] [place], [place] [address]  Phone [phone] Fax [phone] Note: This document was prepared with digital dictation and possible smart phrase technology. Any transcriptional errors that result from this process are unintentional.

## 2019-06-20 NOTE — Progress Notes (Signed)
STROKE TEAM PROGRESS NOTE   INTERVAL HISTORY No family at bedside.  Patient reclined in the bed, awake alert, conversing well, only orientated to place but not to time or age.  Has significant left hemianopia and left neglect.  Taking statin at home PTA.  Discussed about SATURN trial and she is interested. She denies any MI or CAD, but only Takotsubo cardiomyopathy in the past.  Vitals:   06/20/19 0820 06/20/19 0830 06/20/19 0845 06/20/19 0900  BP: (!) 156/59 (!) 154/74 (!) 140/29 (!) 174/115  Pulse:      Resp: (!) 22 20 17 16   Temp:      TempSrc:      SpO2:       CBC:  Recent Labs  Lab 06/18/19 1235 06/20/19 0726  WBC 8.5 6.5  NEUTROABS 6.2  --   HGB 12.7 10.6*  HCT 39.4 31.9*  MCV 103.1* 100.6*  PLT 259 154   Basic Metabolic Panel:  Recent Labs  Lab 06/18/19 1235 06/20/19 0726  NA 139 138  K 5.0 4.2  CL 108 107  CO2 18* 20*  GLUCOSE 235* 184*  BUN 28* 15  CREATININE 1.60* 1.23*  CALCIUM 9.3 8.4*   Lipid Panel:     Component Value Date/Time   CHOL 143 06/20/2019 0726   TRIG 191 (H) 06/20/2019 0726   HDL 29 (L) 06/20/2019 0726   CHOLHDL 4.9 06/20/2019 0726   VLDL 38 06/20/2019 0726   LDLCALC 76 06/20/2019 0726   HgbA1c:  Lab Results  Component Value Date   HGBA1C 8.6 (H) 06/18/2019   Urine Drug Screen:     Component Value Date/Time   LABOPIA NONE DETECTED 01/11/2016 1236   COCAINSCRNUR NONE DETECTED 01/11/2016 1236   LABBENZ NONE DETECTED 01/11/2016 1236   AMPHETMU NONE DETECTED 01/11/2016 1236   THCU NONE DETECTED 01/11/2016 1236   LABBARB NONE DETECTED 01/11/2016 1236    Alcohol Level     Component Value Date/Time   ETH <5 01/11/2016 0955    IMAGING past 24 hours ECHOCARDIOGRAM COMPLETE  Result Date: 06/19/2019    ECHOCARDIOGRAM REPORT   Patient Name:   JANEEN WATSON Date of Exam: 06/19/2019 Medical Rec #:  [id number]     Height:       60.0 in Accession #:    [id number]    Weight:       147.9 lb Date of Birth:  April 25, 1933    BSA:           1.642 m Patient Age:    86 years      BP:           137/87 mmHg Patient Gender: F             HR:           90 bpm. Exam Location:  Inpatient Procedure: 2D Echo Indications:    stroke 434.91  History:        Patient has prior history of Echocardiogram examinations, most                 recent 03/19/2018. CAD, chronic kidney disease.; Risk                 Factors:Diabetes and Dyslipidemia.  Sonographer:    [name] Referring Phys: [place]  1. Left ventricular ejection fraction, by estimation, is 60 to 65%. The left ventricle has normal function. Left ventricular endocardial border not optimally defined to evaluate regional wall motion. Left ventricular  diastolic parameters are consistent with Grade I diastolic dysfunction (impaired relaxation).  2. Right ventricular systolic function is normal. The right ventricular size is normal. Tricuspid regurgitation signal is inadequate for assessing PA pressure.  3. The mitral valve is grossly normal. No evidence of mitral valve regurgitation. No evidence of mitral stenosis.  4. The aortic valve is abnormal. Aortic valve regurgitation is trivial. Mild to moderate aortic valve sclerosis/calcification is present, without any evidence of aortic stenosis.  5. The inferior vena cava is normal in size with greater than 50% respiratory variability, suggesting right atrial pressure of 3 mmHg. Conclusion(s)/Recommendation(s): No intracardiac source of embolism detected on this transthoracic study. A transesophageal echocardiogram is recommended to exclude cardiac source of embolism if clinically indicated. FINDINGS  Left Ventricle: Left ventricular ejection fraction, by estimation, is 60 to 65%. The left ventricle has normal function. Left ventricular endocardial border not optimally defined to evaluate regional wall motion. The left ventricular internal cavity size was normal in size. There is no left ventricular hypertrophy. Left ventricular diastolic  parameters are consistent with Grade I diastolic dysfunction (impaired relaxation). Right Ventricle: The right ventricular size is normal. No increase in right ventricular wall thickness. Right ventricular systolic function is normal. Tricuspid regurgitation signal is inadequate for assessing PA pressure. Left Atrium: Left atrial size was normal in size. Right Atrium: Right atrial size was normal in size. Pericardium: There is no evidence of pericardial effusion. Presence of pericardial fat pad. Mitral Valve: The mitral valve is grossly normal. Normal mobility of the mitral valve leaflets. No evidence of mitral valve regurgitation. No evidence of mitral valve stenosis. Tricuspid Valve: The tricuspid valve is normal in structure. Tricuspid valve regurgitation is trivial. No evidence of tricuspid stenosis. Aortic Valve: The aortic valve is abnormal. Aortic valve regurgitation is trivial. Mild to moderate aortic valve sclerosis/calcification is present, without any evidence of aortic stenosis. There is moderate calcification of the aortic valve. Aortic valve mean gradient measures 7.0 mmHg. Pulmonic Valve: The pulmonic valve was grossly normal. Pulmonic valve regurgitation is trivial. No evidence of pulmonic stenosis. Aorta: The aortic root is normal in size and structure. Venous: The inferior vena cava is normal in size with greater than 50% respiratory variability, suggesting right atrial pressure of 3 mmHg. IAS/Shunts: The interatrial septum was not well visualized.  LEFT VENTRICLE PLAX 2D LVIDd:         4.10 cm  Diastology LVIDs:         3.00 cm  LV e' lateral:   9.03 cm/s LV PW:         1.10 cm  LV E/e' lateral: 12.6 LV IVS:        1.10 cm  LV e' medial:    6.42 cm/s LVOT diam:     1.50 cm  LV E/e' medial:  17.8 LV SV:         55 LV SV Index:   33 LVOT Area:     1.77 cm  RIGHT VENTRICLE             IVC RV S prime:     21.40 cm/s  IVC diam: 2.10 cm LEFT ATRIUM             Index       RIGHT ATRIUM           Index  LA diam:        4.00 cm 2.44 cm/m  RA Area:     11.40 cm LA Vol (A2C):  43.2 ml 26.31 ml/m RA Volume:   23.50 ml  14.31 ml/m LA Vol (A4C):   40.3 ml 24.54 ml/m LA Biplane Vol: 43.1 ml 26.25 ml/m  AORTIC VALVE AV Mean Grad: 7.0 mmHg LVOT Vmax:    134.00 cm/s LVOT Vmean:   92.800 cm/s LVOT VTI:     0.310 m  AORTA Ao Asc diam: 3.00 cm MITRAL VALVE MV Area (PHT): 3.27 cm     SHUNTS MV Decel Time: 232 msec     Systemic VTI:  0.31 m MV E velocity: 114.00 cm/s  Systemic Diam: 1.50 cm MV A velocity: 122.00 cm/s MV E/A ratio:  0.93 Cherlynn Kaiser MD Electronically signed by Cherlynn Kaiser MD Signature Date/Time: 06/19/2019/4:03:15 PM    Final     PHYSICAL EXAM   Temp:  [98 F (36.7 C)-98.9 F (37.2 C)] 98.2 F (36.8 C) (06/01 2000) Pulse Rate:  [28-106] 61 (06/01 2000) Resp:  [15-45] 23 (06/01 2000) BP: (91-203)/(28-169) 135/53 (06/01 2000) SpO2:  [88 %-100 %] 91 % (06/01 2000)  General - Well nourished, well developed, in no apparent distress.  Ophthalmologic - fundi not visualized due to noncooperation.  Cardiovascular - Regular rhythm and rate.  Patient is reclining in the bed, alert and awake, orientated to place but not to age or time.  Poor concentration and slightly tangential, her speech is normal without aphasia or dysarthria, left hemianopia, left neglect, pupils equal and reactive, left gaze incomplete bilaterally, face symmetric, face appears symmetric,  hearing intact to voice, midline tongue, intermittent right hand resting tremor, normal bulk and tone, she is moving all extremities equally and antigravity without drift except left foot DF 4/5, intact to light touch.    ASSESSMENT/PLAN [name] is a 86 y.o. female with history of HTN, MI, CKD3, DM2, depression, previous strokes and ICH with mild cognitive decline over last 1-63yr (mRS 1) presenting with headache. CT with R occipital ICH.    Stroke:   R occipital ICH in setting of mild cognitive decline and fall off bed  - could be hypertensive vs. amyloid angiopathy  CT head right temporoparietal hemorrhage. B Small vessel disease. Sinus dz  MRI  R posterior temporoparietal lobe ICH with SDH, no shift, mild L frontal lobe and L parietal lobe chronic microhemorrhages   MRA head unremarkable, no aneurysm or AVM  2D Echo EF 60-65%. No source of embolus   Carotid Doppler unremarkable  LDL 76  HgbA1c 8.6   SCDs for VTE prophylaxis  aspirin 81 mg daily prior to admission, now on No antithrombotic given hemorrhage   Therapy recommendations:  CIR  Disposition:  pending   Hx stroke/TIA  02/2019 - office visit for transient episode of face and right body paresthesias along with right hand drawing up possibly TIA though sensory seizure or complicated migraine is also possible.  EEG negative.  03/2018 - MRI showed right occipital cortex tiny infarct.  MRA negative.  Carotid Doppler negative.  LDL 83 and A1c 9.4 recommend 30-day CardioNet monitoring.  Put on aspirin 325, Lipitor 80 and fenofibrate under 60.  11/2010 - left parietal convexity SAH.  CTA negative.  EEG negative.  Autoimmune labs negative.  Question for RCVS  Hypertension  Only mildly elevated on admission 143/76 but up to the 190s following arrival  Home meds:  lisinopril 10, metoprolol 50  SBP goal < 160  On cleviprex, prn labetalol   Resumed home meds with lisinopril and metoprolol, increased lisinopril to 20  . Long-term BP goal  normotensive  Hyperlipidemia  Home meds:  lipitor 40  LDL 76  Hold statin in setting of hemorrhage  Pt interested in SATURN trial  Diabetes type II Uncontrolled  Home meds:  Glimepiride 2 mg bid, NPH/R 36u/23u acb, 6u/27u acs  HgbA1c 8.6, goal < 7.0  CBGs  SSI  Close PCP follow-up for better DM control  Cognitive impairment  Daughter admitted that patient has cognitive impairment at home  RN has observed sundowning during hospitalization  On clonazepam 0.5 twice daily  Added  Seroquel 25 nightly  Other Stroke Risk Factors  Advanced age  ETOH use, advised to drink no more than 1 drink(s) a day  Family hx stroke (mother, dtr w/ ICH )  Takotsubo cardiomyopathy  Other Active Problems  CKD stage 3, Cre 1.69->1.6->1.23   Patient and daughter are interested in [facility] day # 2  This patient is critically ill due to hypertensive emergency, right ICH, SAH and at significant risk of neurological worsening, death form hematoma expansion, cerebral edema, hypertensive encephalopathy, brain herniation, seizure. This patient's care requires constant monitoring of vital signs, hemodynamics, respiratory and cardiac monitoring, review of multiple databases, neurological assessment, discussion with family, other specialists and medical decision making of high complexity. I spent 35 minutes of neurocritical care time in the care of this patient. I had long discussion with daughter over the phone, updated pt current condition, treatment plan and potential prognosis, and answered all the questions.  She expressed understanding and appreciation.    Rosalin Hawking, MD PhD Stroke Neurology 06/20/2019 8:45 PM    To contact Stroke Continuity provider, please refer to [email or web address] After hours, contact General Neurology

## 2019-06-20 NOTE — Progress Notes (Signed)
Physical Therapy Treatment Patient Details Name: Sandra [name] MRN: [id number] DOB: 04/19/1933 Today's Date: 06/20/2019    History of Present Illness Sandra [name] is an 86 y.o. female with PMH of HTN, MI, CKD3, DM2, depression, previous strokes and ICH with mild cognitive decline admitted due to confusion, difficulty walking at home.  Found to have [place] R parietotemporal  versus hemorrhagic infarct.  MRI suggested hypertensive bleed.    PT Comments    Patient allowed to run into items on L side but she could not problem solve with just verbal cues to get around them.  Took repositioning 3 times in bathroom to get situated to sit on toilet with increased time and max cues.  She was able to turn her head to see recliner after I sat in it and called her to look at me several times which improved safety with getting to chair.  Daughter in room end of session and able to see how her mother struggled.  Patient more stable with less L side LE issues with ambulation today.  Still appropriate for CIR level rehab at d/c.  PT to follow.    Follow Up Recommendations  CIR     Equipment Recommendations  None recommended by PT    Recommendations for Other Services       Precautions / Restrictions Precautions Precautions: Fall Precaution Comments: L hemianopsia, L inattention    Mobility  Bed Mobility Overal bed mobility: Needs Assistance Bed Mobility: Supine to Sit     Supine to sit: HOB elevated;Min assist     General bed mobility comments: for balance while scooting to EOB  Transfers Overall transfer level: Needs assistance Equipment used: Rolling walker (2 wheeled) Transfers: Sit to/from Stand Sit to Stand: Min assist         General transfer comment: assist to stand pt pulling up on RW  Ambulation/Gait Ambulation/Gait assistance: Mod assist Gait Distance (Feet): 100 Feet Assistive device: Rolling walker (2 wheeled) Gait Pattern/deviations: Step-through pattern;Decreased  stride length;Drifts right/left     General Gait Details: visual and verbal cue for forward gait, assist around obstacles on L side and mod to max cues and increased time turning to sit on toilet in bathroom and to sit on chair in room totally unaware of location needing max multimodal cues to see it to get to it   Stairs             Wheelchair Mobility    Modified Rankin (Stroke Patients Only) Modified Rankin (Stroke Patients Only) Pre-Morbid Rankin Score: Moderate disability Modified Rankin: Moderately severe disability     Balance Overall balance assessment: Needs assistance Sitting-balance support: Feet unsupported;Feet supported Sitting balance-Leahy Scale: Fair     Standing balance support: Bilateral upper extremity supported Standing balance-Leahy Scale: Poor Standing balance comment: min a for balance                            Cognition Arousal/Alertness: Awake/alert Behavior During Therapy: WFL for tasks assessed/performed Overall Cognitive Status: Impaired/Different from baseline Area of Impairment: Attention;Memory;Following commands;Safety/judgement;Awareness;Problem solving                     Memory: Decreased short-term memory Following Commands: Follows one step commands with increased time Safety/Judgement: Decreased awareness of safety;Decreased awareness of deficits Awareness: Intellectual Problem Solving: Slow processing;Difficulty sequencing;Requires verbal cues;Requires tactile cues General Comments: mod cues for following IV pole for forward gait, still needing assist around door facing  on L with RW and frequent cues for L hand placement      Exercises      General Comments General comments (skin integrity, edema, etc.): daughter arrived end of session, confirmed pt will have 24 hour assist      Pertinent Vitals/Pain Pain Assessment: Faces Faces Pain Scale: Hurts little more Pain Location: head Pain Descriptors /  Indicators: Headache Pain Intervention(s): Monitored during session    Home Living                      Prior Function            PT Goals (current goals can now be found in the care plan section) Progress towards PT goals: Progressing toward goals    Frequency    Min 4X/week      PT Plan Current plan remains appropriate    Co-evaluation              AM-PAC PT "6 Clicks" Mobility   Outcome Measure  Help needed turning from your back to your side while in a flat bed without using bedrails?: None Help needed moving from lying on your back to sitting on the side of a flat bed without using bedrails?: A Little Help needed moving to and from a bed to a chair (including a wheelchair)?: A Little Help needed standing up from a chair using your arms (e.g., wheelchair or bedside chair)?: A Little Help needed to walk in hospital room?: A Lot Help needed climbing 3-5 steps with a railing? : A Lot 6 Click Score: 17    End of Session Equipment Utilized During Treatment: Gait belt Activity Tolerance: Patient tolerated treatment well Patient left: in chair;with call bell/phone within reach;with chair alarm set;with family/visitor present;with nursing/sitter in room   PT Visit Diagnosis: Other abnormalities of gait and mobility (R26.89);Muscle weakness (generalized) (M62.81);Hemiplegia and hemiparesis Hemiplegia - Right/Left: Left Hemiplegia - dominant/non-dominant: Non-dominant Hemiplegia - caused by: Nontraumatic intracerebral hemorrhage     Time: 1140-1213 PT Time Calculation (min) (ACUTE ONLY): 33 min  Charges:  $Gait Training: 8-22 mins $Neuromuscular Re-education: 8-22 mins                     Sandra [name], [place] [facility] [phone] 06/20/2019    Sandra [name] 06/20/2019, 2:05 PM

## 2019-06-20 NOTE — Progress Notes (Signed)
Carotid duplex complete.  Please see CV Proc tab for preliminary results. Olla, RVT 5:29 PM  06/20/2019

## 2019-06-20 NOTE — Progress Notes (Signed)
Noted that STAT MRA that was ordered yesterday, 5/31 at about 1500 had not been completed.  Day shift RN said she was pushed back to night shift b/c numerous ED MRIs.  Called MRI this morning to find out why it had not been done over the 12 hour night shift and was told by MRI tech that they had a number of ED MRIs and they took priority over STAT ICU scans. When asked if I could bring patient down now, I was told they would notify me when they have availability. Moncerrath Berhe C  8:13 AM

## 2019-06-20 NOTE — Progress Notes (Signed)
Inpatient Rehabilitation Admissions Coordinator  Inpatient rehab consult received. I met with patient and her daughter at bedside. We discussed goals and expectations of a possible inpt rehab admit. Dr. Dagoberto Ligas to see patient today. I will follow up tomorrow and begin insurance authorization for a possible CIR admit.  Danne Baxter, RN, MSN Rehab Admissions Coordinator [phone] 06/20/2019 1:42 PM

## 2019-06-21 DIAGNOSIS — I10 Essential (primary) hypertension: Secondary | ICD-10-CM

## 2019-06-21 DIAGNOSIS — N1832 Chronic kidney disease, stage 3b: Secondary | ICD-10-CM

## 2019-06-21 LAB — GLUCOSE, CAPILLARY
Glucose-Capillary: 208 mg/dL — ABNORMAL HIGH (ref 70–99)
Glucose-Capillary: 213 mg/dL — ABNORMAL HIGH (ref 70–99)
Glucose-Capillary: 214 mg/dL — ABNORMAL HIGH (ref 70–99)
Glucose-Capillary: 207 mg/dL — ABNORMAL HIGH (ref 70–99)

## 2019-06-21 LAB — BASIC METABOLIC PANEL
Anion gap: 12 (ref 5–15)
BUN: 18 mg/dL (ref 8–23)
CO2: 19 mmol/L — ABNORMAL LOW (ref 22–32)
Calcium: 8.6 mg/dL — ABNORMAL LOW (ref 8.9–10.3)
Chloride: 105 mmol/L (ref 98–111)
Creatinine, Ser: 1.42 mg/dL — ABNORMAL HIGH (ref 0.44–1.00)
GFR calc Af Amer: 39 mL/min — ABNORMAL LOW (ref >60)
GFR calc non Af Amer: 34 mL/min — ABNORMAL LOW (ref >60)
Glucose, Bld: 198 mg/dL — ABNORMAL HIGH (ref 70–99)
Potassium: 4.2 mmol/L (ref 3.5–5.1)
Sodium: 136 mmol/L (ref 135–145)

## 2019-06-21 LAB — CBC
HCT: 31.7 % — ABNORMAL LOW (ref 36.0–46.0)
Hemoglobin: 10.7 g/dL — ABNORMAL LOW (ref 12.0–15.0)
MCH: 34 pg (ref 26.0–34.0)
MCHC: 33.8 g/dL (ref 30.0–36.0)
MCV: 100.6 fL — ABNORMAL HIGH (ref 80.0–100.0)
Platelets: 221 10*3/uL (ref 150–400)
RBC: 3.15 MIL/uL — ABNORMAL LOW (ref 3.87–5.11)
RDW: 11.6 % (ref 11.5–15.5)
WBC: 8.9 10*3/uL (ref 4.0–10.5)
nRBC: 0 % (ref 0.0–0.2)

## 2019-06-21 MED ORDER — INSULIN DETEMIR 100 UNIT/ML ~~LOC~~ SOLN
10.0000 [IU] | Freq: Two times a day (BID) | SUBCUTANEOUS | Status: DC
  Administered 2019-06-21 – 2019-06-22 (×3): 10 [IU] via SUBCUTANEOUS
  Filled 2019-06-21 (×5): qty 0.1

## 2019-06-21 MED ORDER — CLONAZEPAM 0.25 MG PO TBDP
0.5000 mg | ORAL_TABLET | Freq: Every day | ORAL | Status: DC
  Administered 2019-06-21: 0.5 mg via ORAL
  Filled 2019-06-21: qty 2

## 2019-06-21 MED ORDER — SODIUM CHLORIDE 0.9 % IV SOLN: INTRAVENOUS | Status: DC

## 2019-06-21 NOTE — Progress Notes (Signed)
Physical Therapy Treatment Patient Details Name: Sandra [name] MRN: [id number] DOB: 11-27-1933 Today's Date: 06/21/2019    History of Present Illness Sandra [name] is an 86 y.o. female with PMH of HTN, MI, CKD3, DM2, depression, previous strokes and ICH with mild cognitive decline admitted due to confusion, difficulty walking at home.  Found to have [place] R parietotemporal  versus hemorrhagic infarct.  MRI suggested hypertensive bleed.    PT Comments    Patient progressing with ambulation distance and slightly less assist to keep walker straight when using visual target.  Able to find room number on the wall with mod cues, then to move further left to get through the door she needed assist.  She remains slightly apraxic in addition to L inattention which makes moving more challenging.  She remains appropriate for CIR level rehab at d/c.  PT to follow.    Follow Up Recommendations  CIR     Equipment Recommendations  None recommended by PT    Recommendations for Other Services       Precautions / Restrictions Precautions Precautions: Fall Precaution Comments: L hemianopsia, L inattention, apraxia    Mobility  Bed Mobility Overal bed mobility: Needs Assistance Bed Mobility: Rolling;Sidelying to Sit Rolling: Supervision Sidelying to sit: Min assist       General bed mobility comments: up to R side of bed with increased time, pt struggling so cues given and A to lift trunk  Transfers Overall transfer level: Needs assistance Equipment used: Rolling walker (2 wheeled) Transfers: Sit to/from Stand Sit to Stand: Min assist         General transfer comment: assist to stand pt pulling up on RW bed higher than her feet can reach the ground  Ambulation/Gait Ambulation/Gait assistance: Mod assist Gait Distance (Feet): 100 Feet(x 2) Assistive device: Rolling walker (2 wheeled) Gait Pattern/deviations: Step-through pattern;Decreased stride length;Drifts right/left     General  Gait Details: cues for eyes on visual target for forward gaze and straight path in hallway assist for balance and to keep walker moving straight as pt veers to R consistently when not assisted; assist for safety on turns with walker management and cues for obtaining new visual target   Stairs             Wheelchair Mobility    Modified Rankin (Stroke Patients Only) Modified Rankin (Stroke Patients Only) Pre-Morbid Rankin Score: Moderate disability Modified Rankin: Moderately severe disability     Balance Overall balance assessment: Needs assistance Sitting-balance support: Feet unsupported;Feet supported Sitting balance-Leahy Scale: Fair     Standing balance support: Bilateral upper extremity supported Standing balance-Leahy Scale: Poor Standing balance comment: L hand falls off walker frequently needing cues and assist to replace as LOB at times when hand falls off                            Cognition Arousal/Alertness: Awake/alert Behavior During Therapy: WFL for tasks assessed/performed Overall Cognitive Status: Impaired/Different from baseline Area of Impairment: Attention;Memory;Following commands;Safety/judgement;Awareness;Problem solving                   Current Attention Level: Sustained Memory: Decreased short-term memory Following Commands: Follows one step commands consistently;Follows multi-step commands with increased time Safety/Judgement: Decreased awareness of safety;Decreased awareness of deficits Awareness: Intellectual Problem Solving: Slow processing;Difficulty sequencing;Requires verbal cues;Requires tactile cues General Comments: needs multimodal cues to find objects and room number to L of midline      Exercises  General Comments General comments (skin integrity, edema, etc.): set up lunch tray with pt in chair end of session      Pertinent Vitals/Pain Pain Assessment: Faces Faces Pain Scale: Hurts little more Pain  Location: head Pain Descriptors / Indicators: Headache Pain Intervention(s): Monitored during session    Home Living                      Prior Function            PT Goals (current goals can now be found in the care plan section) Progress towards PT goals: Progressing toward goals    Frequency    Min 4X/week      PT Plan Current plan remains appropriate    Co-evaluation              AM-PAC PT "6 Clicks" Mobility   Outcome Measure  Help needed turning from your back to your side while in a flat bed without using bedrails?: None Help needed moving from lying on your back to sitting on the side of a flat bed without using bedrails?: A Little Help needed moving to and from a bed to a chair (including a wheelchair)?: A Little Help needed standing up from a chair using your arms (e.g., wheelchair or bedside chair)?: A Little Help needed to walk in hospital room?: A Lot Help needed climbing 3-5 steps with a railing? : A Lot 6 Click Score: 17    End of Session Equipment Utilized During Treatment: Gait belt Activity Tolerance: Patient tolerated treatment well Patient left: in chair;with chair alarm set;with call bell/phone within reach Nurse Communication: Mobility status PT Visit Diagnosis: Other abnormalities of gait and mobility (R26.89);Muscle weakness (generalized) (M62.81);Hemiplegia and hemiparesis Hemiplegia - Right/Left: Left Hemiplegia - dominant/non-dominant: Non-dominant Hemiplegia - caused by: Nontraumatic intracerebral hemorrhage     Time: 1152-1222 PT Time Calculation (min) (ACUTE ONLY): 30 min  Charges:  $Gait Training: 8-22 mins $Therapeutic Activity: 8-22 mins                     Magda Kiel, [place] [facility] [phone] 06/21/2019    [name] 06/21/2019, 4:06 PM

## 2019-06-21 NOTE — Progress Notes (Addendum)
Inpatient Diabetes Program Recommendations  AACE/ADA: New Consensus Statement on Inpatient Glycemic Control (2015)  Target Ranges:  Prepandial:   less than 140 mg/dL      Peak postprandial:   less than 180 mg/dL (1-2 hours)      Critically ill patients:  140 - 180 mg/dL   Results for Sandra [name], Sandra [name] (MRN [id number]) as of 06/21/2019 12:52  Ref. Range 06/20/2019 07:34 06/20/2019 11:32 06/20/2019 18:05 06/20/2019 22:17  Glucose-Capillary Latest Ref Range: 70 - 99 mg/dL 167 (H)  3 units NOVOLOG  178 (H)  3 units NOVOLOG  237 (H)  5 units NOVOLOG  134 (H)   Results for Sandra [name], Sandra [name] (MRN [id number]) as of 06/21/2019 12:52  Ref. Range 06/21/2019 07:31 06/21/2019 11:38  Glucose-Capillary Latest Ref Range: 70 - 99 mg/dL 208 (H)  5 units NOVOLOG  213 (H)  5 units NOVOLOG    Results for Sandra [name], Sandra [name] (MRN [id number]) as of 06/21/2019 12:52  Ref. Range 06/18/2019 12:35  Hemoglobin A1C Latest Ref Range: 4.8 - 5.6 % 8.6 (H)    Admit with: ICH/ Cerebral Edema/ HTN emergency  History: DM, CKD3, CVA  Home DM Meds: Amaryl 2 mg BID       NPH Insulin 36 units AM/ 6 units PM       Regular 23 units Breakfast/ 23 units Lunch/ 27 units Dinner  Current Orders: Novolog Moderate Correction Scale/ SSI (0-15 units) TID AC   Endocrinologist: Dr. Melton Alar with [facility]--last seen 05/23/2019--Given the following instructions with her meds: Continue Novolin N (NPH) as follows : 36 units in the AM to target hypoglycemia during the day 6 units in the PM  About 12 hours apart to target AM hypoglycemia Increase Novolin R (regular) before dinner to 27 units no more than 30 minutes before dinner, continue 23 units no more than 30 minutes before breakfast and and at lunch Continue glimepiride 4mg  (1/2 tablet) twice a day with breakfast and dinner     MD- Please consider adding basal insulin to current inpatient insulin regimen.  Recommend we start Levemir as a weight based dose for now.  Can have pt resume her NPH  Insulin and Regular Insulin when she goes home.  Recommend: Levemir 10 units BID (0.3 units/kg based on weight of 67 kg)     --Will follow patient during hospitalization--  Wyn Quaker RN, MSN, CDE Diabetes Coordinator Inpatient Glycemic Control Team Team Pager: [phone] (8a-5p)

## 2019-06-21 NOTE — Progress Notes (Signed)
Pt arrived to unit, verified on telemetry  

## 2019-06-21 NOTE — Research (Signed)
Pt had lobar hemorrhage, no suspected secondary cause of the ICH, no severe dementia or mRS > 3. She is a good candidate for SATURN trial. Pt and her daughter are both interested in the trial. I called pt PCP Dr. Daron Offer office and learned that she was out of office for vacation and only be back next Tuesday. I asked her RN to send message to her inbox to inform her. I also called her cardiologist Dr. Virgina Jock to let him know and he has no objection for the trial. We will plan to enroll and randomize in am.   Rosalin Hawking, MD PhD Stroke Neurology 06/21/2019 5:15 PM

## 2019-06-21 NOTE — Progress Notes (Signed)
STROKE TEAM PROGRESS NOTE   INTERVAL HISTORY No family at bedside.  Patient initially sleeping but easily arousable and able to remain awake, following simple commands and answer questions appropriately. Pt had seroquel and clonazepam last night and slept well. Cre elevated and likely due to inadequate po intake, will put on gentle hydration.   Vitals:   06/21/19 0800 06/21/19 0900 06/21/19 1002 06/21/19 1005  BP: (!) 133/39  100/82 (!) 144/46  Pulse:  66 81 76  Resp: 18 (!) 22 (!) 21 16  Temp: 98.3 F (36.8 C)     TempSrc: Oral     SpO2:  95% 98% 99%   CBC:  Recent Labs  Lab 06/18/19 1235 06/18/19 1235 06/20/19 0726 06/21/19 0649  WBC 8.5   < > 6.5 8.9  NEUTROABS 6.2  --   --   --   HGB 12.7   < > 10.6* 10.7*  HCT 39.4   < > 31.9* 31.7*  MCV 103.1*   < > 100.6* 100.6*  PLT 259   < > 236 221   < > = values in this interval not displayed.   Basic Metabolic Panel:  Recent Labs  Lab 06/20/19 0726 06/21/19 0649  NA 138 136  K 4.2 4.2  CL 107 105  CO2 20* 19*  GLUCOSE 184* 198*  BUN 15 18  CREATININE 1.23* 1.42*  CALCIUM 8.4* 8.6*   Lipid Panel:     Component Value Date/Time   CHOL 143 06/20/2019 0726   TRIG 191 (H) 06/20/2019 0726   HDL 29 (L) 06/20/2019 0726   CHOLHDL 4.9 06/20/2019 0726   VLDL 38 06/20/2019 0726   LDLCALC 76 06/20/2019 0726   HgbA1c:  Lab Results  Component Value Date   HGBA1C 8.6 (H) 06/18/2019   Urine Drug Screen:     Component Value Date/Time   LABOPIA NONE DETECTED 01/11/2016 1236   COCAINSCRNUR NONE DETECTED 01/11/2016 1236   LABBENZ NONE DETECTED 01/11/2016 1236   AMPHETMU NONE DETECTED 01/11/2016 1236   THCU NONE DETECTED 01/11/2016 1236   LABBARB NONE DETECTED 01/11/2016 1236    Alcohol Level     Component Value Date/Time   ETH <5 01/11/2016 0955    IMAGING past 24 hours MR ANGIO HEAD WO CONTRAST  Result Date: 06/20/2019 CLINICAL DATA:  Follow-up hemorrhagic stroke. Right temporoparietal insult. EXAM: MRA HEAD WITHOUT  CONTRAST TECHNIQUE: Angiographic images of the Circle of Willis were obtained using MRA technique without intravenous contrast. COMPARISON:  MRI 06/18/2019.  CT 06/18/2019. FINDINGS: Both internal carotid arteries are widely patent through the skull base and siphon region. The anterior and middle cerebral vessels are patent without proximal stenosis, aneurysm or vascular malformation. Large posterior communicating artery on the right. Both vertebral arteries are widely patent to the basilar. No basilar stenosis. Posterior circulation branch vessels are normal. No high flow vascular abnormality is seen in the region of the right temporoparietal hematoma. IMPRESSION: No evidence of aneurysm or vascular malformation to explain the right temporoparietal intraparenchymal hemorrhage. Normal MR angiography of the large and medium sized intracranial vessels. Electronically Signed   By: Nelson Chimes M.D.   On: 06/20/2019 16:45   VAS US CAROTID  Result Date: 06/21/2019 Carotid Arterial Duplex Study Indications:       CVA. Limitations        Today's exam was limited due to the high bifurcation of the  carotid and poor penetration. Comparison Study:  04/11/18 Performing Technologist: Antonieta Pert RDMS, RVT  Examination Guidelines: A complete evaluation includes B-mode imaging, spectral Doppler, color Doppler, and power Doppler as needed of all accessible portions of each vessel. Bilateral testing is considered an integral part of a complete examination. Limited examinations for reoccurring indications may be performed as noted.  Right Carotid Findings: +----------+--------+--------+--------+------------------+--------+             PSV cm/s EDV cm/s Stenosis Plaque Description Comments  +----------+--------+--------+--------+------------------+--------+  CCA Prox   96       16                                             +----------+--------+--------+--------+------------------+--------+  CCA Distal 75        15                                             +----------+--------+--------+--------+------------------+--------+  ICA Prox   107      25                                             +----------+--------+--------+--------+------------------+--------+  ICA Mid    88       20                                             +----------+--------+--------+--------+------------------+--------+  ICA Distal 56       23                                             +----------+--------+--------+--------+------------------+--------+  ECA        108      14                                             +----------+--------+--------+--------+------------------+--------+ +----------+--------+-------+--------+-------------------+             PSV cm/s EDV cms Describe Arm Pressure (mmHG)  +----------+--------+-------+--------+-------------------+  Subclavian 199                                            +----------+--------+-------+--------+-------------------+ +---------+--------+--+--------+--+---------+  Vertebral PSV cm/s 82 EDV cm/s 21 Antegrade  +---------+--------+--+--------+--+---------+ difficult to evaluate plaque due to poor 2D images Left Carotid Findings: +----------+--------+--------+--------+------------------+--------+             PSV cm/s EDV cm/s Stenosis Plaque Description Comments  +----------+--------+--------+--------+------------------+--------+  CCA Prox   92       13                                             +----------+--------+--------+--------+------------------+--------+  CCA Distal 79       14                                             +----------+--------+--------+--------+------------------+--------+  ICA Prox   70       17                                             +----------+--------+--------+--------+------------------+--------+  ICA Mid    85       21                                             +----------+--------+--------+--------+------------------+--------+  ICA Distal 90       27                                              +----------+--------+--------+--------+------------------+--------+  ECA        120      22                                             +----------+--------+--------+--------+------------------+--------+ +----------+--------+--------+--------+-------------------+             PSV cm/s EDV cm/s Describe Arm Pressure (mmHG)  +----------+--------+--------+--------+-------------------+  Subclavian 163                                             +----------+--------+--------+--------+-------------------+ +---------+--------+--+--------+--+---------+  Vertebral PSV cm/s 46 EDV cm/s 10 Antegrade  +---------+--------+--+--------+--+---------+ difficult to evaluate plaque due to poor 2D images  Summary: Right Carotid: Velocities in the right ICA are consistent with a 1-39% stenosis. Left Carotid: Velocities in the left ICA are consistent with a 1-39% stenosis. Vertebrals: Bilateral vertebral arteries demonstrate antegrade flow. *See table(s) above for measurements and observations.  Electronically signed by Antony Contras MD on 06/21/2019 at 8:28:26 AM.    Final     PHYSICAL EXAM   Temp:  [97.5 F (36.4 C)-98.3 F (36.8 C)] 98.3 F (36.8 C) (06/02 0800) Pulse Rate:  [57-81] 76 (06/02 1005) Resp:  [15-34] 16 (06/02 1005) BP: (100-203)/(31-169) 144/46 (06/02 1005) SpO2:  [91 %-100 %] 99 % (06/02 1005)  General - Well nourished, well developed, in no apparent distress.  Ophthalmologic - fundi not visualized due to noncooperation.  Cardiovascular - Regular rhythm and rate.  Patient is lying in the bed, alert and awake, orientated to place and people but not to age or time.  Speech is normal without aphasia or dysarthria, left hemianopia, left neglect, pupils equal and reactive, left gaze incomplete bilaterally, face symmetric, face appears symmetric, midline tongue, intermittent right hand resting tremor, normal bulk and tone, she is moving all extremities equally and antigravity  without drift except left foot DF 4/5, intact to light touch.    ASSESSMENT/PLAN Sandra [name] is a 86 y.o. female with history of HTN, MI, CKD3, DM2, depression, previous strokes and ICH with mild cognitive decline over last 1-82yr (mRS 1) presenting with headache. CT with R occipital ICH.    Stroke:   R occipital ICH in setting of mild cognitive decline and fall off bed - could be hypertensive vs. amyloid angiopathy  CT head right temporoparietal hemorrhage. B Small vessel disease. Sinus dz  MRI  R posterior temporoparietal lobe ICH with SDH, no shift, mild L frontal lobe and L parietal lobe chronic microhemorrhages   MRA head unremarkable, no aneurysm or AVM  2D Echo EF 60-65%. No source of embolus   Carotid Doppler unremarkable  LDL 76  HgbA1c 8.6   SCDs for VTE prophylaxis  aspirin 81 mg daily prior to admission, now on No antithrombotic given hemorrhage   Therapy recommendations:  CIR  Disposition:  pending   Hx stroke/TIA  02/2019 - office visit for transient episode of face and right body paresthesias along with right hand drawing up possibly TIA though sensory seizure or complicated migraine is also possible.  EEG negative.  03/2018 - MRI showed right occipital cortex tiny infarct.  MRA negative.  Carotid Doppler negative.  LDL 83 and A1c 9.4 recommend 30-day CardioNet monitoring.  Put on aspirin 325, Lipitor 80 and fenofibrate under 60.  11/2010 - left parietal convexity SAH.  CTA negative.  EEG negative.  Autoimmune labs negative.  Question for RCVS  Hypertension  Only mildly elevated on admission 143/76 but up to the 190s following arrival  Home meds:  lisinopril 10, metoprolol 50  SBP goal < 160  Off cleviprex now   On prn labetalol   Now on metoprolol 50, increased lisinopril to 20   Long-term BP goal normotensive  Hyperlipidemia  Home meds:  lipitor 40  LDL 76  Hold statin in setting of hemorrhage  Pt interested in SATURN trial  Diabetes  type II Uncontrolled  Home meds:  Glimepiride 2 mg bid, NPH/R 36u/23u acb, 6u/27u acs  HgbA1c 8.6, goal < 7.0  CBG fluctuate  SSI  Close PCP follow-up for better DM control  Cognitive impairment  Daughter admitted that patient has cognitive impairment at home  RN has observed sundowning during hospitalization  On clonazepam 0.5 twice daily -> Qhs  On Seroquel 25 nightly  Other Stroke Risk Factors  Advanced age  ETOH use, advised to drink no more than 1 drink(s) a day  Family hx stroke (mother, dtr w/ ICH )  Takotsubo cardiomyopathy  Other Active Problems  CKD stage 3, Cre 1.69->1.6->1.23 -> 1.42 - add IVF and encourage po intake   Patient and daughter are interested in [facility] day # 3  Patient continues to be critically ill for the last 24 hours, BP better controlled and now off cleviprex. Continues to have elevated Cre, and I ordered IVF and encourage po intake. She seems a little drowsy this am and changed clonazepam to Qhs and continued seroquel newly added yesterday.   This patient is critically ill due to hypertensive emergency, right ICH, SAH and at significant risk of neurological worsening, death form hematoma expansion, cerebral edema, hypertensive encephalopathy, brain herniation, seizure. This patient's care requires constant monitoring of vital signs, hemodynamics, respiratory and cardiac monitoring, review of multiple databases, neurological assessment, discussion with family, other specialists and medical decision making of high complexity. I spent 35 minutes of neurocritical care time in the care of this patient. I had long discussion with  daughter over the phone, updated pt current condition, treatment plan and potential prognosis, and answered all the questions.  She expressed understanding and appreciation.    Rosalin Hawking, MD PhD Stroke Neurology 06/21/2019 11:31 AM    To contact Stroke Continuity provider, please refer to  [email or web address] After hours, contact General Neurology

## 2019-06-21 NOTE — PMR Pre-admission (Signed)
PMR Admission Coordinator Pre-Admission Assessment  Patient: Sandra [name] is an 86 y.o., female MRN: [id number] DOB: Mar 02, 1933 Height:   5' Weight:   148 lbs              Insurance Information  PRIMARY: [employer]      Policy#: [id number]      Subscriber: pt CM Name: Sandra [name]      Phone#: [phone] ext [phone]     Fax#: [phone] Pre-Cert#: [id number] approved for 7 days with f/u with Sandra [name] ext [phone]      Employer:  Benefits:  Phone #: [phone]     Name:  Eff. Date: 01/20/2019     Deduct: none      Out of Pocket Max: $3900  CIR: $295 co pay per day days 1 until 6      SNF: no copay days 1 until 20; $184 co pay per day days 21 until 100 Outpatient: $10 to 40 per visit     Co-Pay: visits per medical neccesity Home Health: 100%    Co-Pay: visits per medical neccesity DME: 80%     Co-Pay: 205 Providers: in network  SECONDARY: none  Financial Counselor:       Phone#:   The [employer] for patients in Inpatient Rehabilitation Facilities with attached Privacy Act [place] Records was provided and verbally reviewed with: Patient and Family  Emergency Contact Information Contact Information    Name Relation Home Work [place] Daughter [phone]     Garrel Ridgel   [phone]     Current Medical History  Patient Admitting Diagnosis: ICH  History of Present Illness: Sandra [name] is a 86 y.o. RH- female with history of prior hemorrhagic stroke and TIA, T2DM with retinopathy, CKD (Sandra [name]), interstitial lung disease, chronic LBP, fall off her porch 06/06/19 with mild injuries. She had recurrent fall on 05/29 am with reports of HA and right elbow pain-- presented to ED that evening but left due to wait time. She was admitted on 06/18/19 am with reports of confusion, HA, dizziness and difficulty walking. She was found to have left hemianopsia with mild neglect and CT head done revealing focal hemorrhage in mid occipital  lobe likely from hemorrhagic infarct and mild cytotoxic edema with small vessel disease.   MRI brain done showing hematoma in right posterior temporoparietal lobe containing methemoglobin and extension into subdural space with small SDH and evidence of prior micro hemorrhages in left frontal and left parietal lobe likely due to hypertensive bleed.   Neurology questioned hypertension v/s trauma due to multiple falls v/s amyloid as cause of bleed.  MRA ordered for work up and ASA d/c due to bleed. On cleviprex with SBP goal < 140.  2 D echo done revealing EF 60-65% with grade 1 diastolic dysfunction and mild-moderate aortic valve sclerosis.  Therapy evaluations completed revealing decreased awareness of deficits, left hemianopsia  with left inattention as well as gait disorder with fear of falling.  Noted temp 6/2 101.2  But now afebrile with WBC 8.0 Urine culture pending.   Complete NIHSS TOTAL: 2 Glasgow Coma Scale Score: 15  Past Medical History  Past Medical History:  Diagnosis Date   Abnormal blood finding 02/10/2016   Abrasion of ear canal 09/28/2012   Achilles tendon injury 11/27/2010   ACID REFLUX DISEASE 05/12/2006   Qualifier: Diagnosis of  By: Sandra Kells MD, Sandra [name]    Acute MI [facility])    ALLERGIC RHINITIS 05/12/2006   Qualifier: Diagnosis  of  By: Sandra Kells MD, Sandra [name]    Anxiety state 07/03/2009   Qualifier: Diagnosis of  By: Sandra Kells MD, [place] pain 07/01/2010   Chronic kidney disease (CKD), stage III (moderate) (Sandra [name]) 05/31/2014   Chronic right SI joint pain 09/22/2013   DEGENERATIVE JOINT DISEASE, CERVICAL SPINE 06/23/2006   Annotation: had a CAT scan with a cervical myelogram that show  left C6-7 and  C5-6 foraminal stenosis Qualifier: Diagnosis of  By: Sandra Kells MD, Sandra [name] 05/12/2006   Qualifier: Diagnosis of  By: Sandra Kells MD, Sandra [name], TYPE II 05/12/2006   Now following w/ Endo at [name] 04/27/2007   Qualifier: Diagnosis of  By:  Sandra [name]     Dyspnea 02/10/2016   Encephalopathy, hypertensive    Essential hypertension 05/12/2006   Qualifier: Diagnosis of  By: Sandra Kells MD, Sandra [name]    Fatigue 05/31/2014   GAIT DISTURBANCE 08/07/2009   Qualifier: Diagnosis of  By: Sandra Kells MD, Sandra E.    GERD (gastroesophageal reflux disease)    Headache(784.0) 11/27/2010   Hyperlipidemia    Hyperlipidemia associated with type 2 diabetes mellitus (Sandra [name]) 05/12/2006   Qualifier: Diagnosis of  By: Sandra Kells MD, Sandra [name]    ILD (interstitial lung disease) ([place]) 05/31/2015   Leukocytopenia 05/31/2014   Lower abdominal pain 05/17/2014   Nausea with vomiting 09/22/2013   NECK PAIN, CHRONIC 04/03/2008   Qualifier: Diagnosis of  By: Sandra Kells MD, Sandra [name]    Nodule on liver 12/06/2015   Osteoarthritis 02/10/2016   Osteopenia    Osteoporosis 06/23/2006   Annotation: had a bone density test in 08-2004.  T score was -2.4 Qualifier: Diagnosis of  By: Sandra Kells MD, Sandra [name]     SAH (subarachnoid hemorrhage) (Sandra [name])    Sciatica    Scleroderma (Sandra [name]) 02/28/2016   Severe hypertension 03/18/2018   Slurred speech 11/27/2010   Takotsubo cardiomyopathy    s/p stress MI with stress induced CM with normal coronary arteries by cath 2007 with normalization of LVF by echo 04/2005   TIA (transient ischemic attack)    Unspecified cerebral artery occlusion with cerebral infarction 04/07/2012   UTI (urinary tract infection) 11/02/2011   Vasculitis of skin 09/28/2012   Vitamin D deficiency 10/01/2014    Family History  family history includes Asthma in her sister; Breast cancer in an other family member; Cancer in an other family member; Diabetes in her sister and sister; Intracerebral hemorrhage in her daughter; Stroke in her mother.  Prior Rehab/Hospitalizations:  Has the patient had prior rehab or hospitalizations prior to admission? Yes  Has the patient had major surgery during 100 days prior to admission? No  Current Medications   Current Facility-Administered  Medications:    0.9 %  sodium chloride infusion, , Intravenous, Continuous, Rosalin Hawking, MD, Last Rate: 50 mL/hr at 06/22/19 0400, Rate Verify at 06/22/19 0400   acetaminophen (TYLENOL) tablet 650 mg, 650 mg, Oral, Q4H PRN, 650 mg at 06/22/19 0423 **OR** acetaminophen (TYLENOL) 160 MG/5ML solution 650 mg, 650 mg, Per Tube, Q4H PRN **OR** acetaminophen (TYLENOL) suppository 650 mg, 650 mg, Rectal, Q4H PRN, Metzger-Cihelka, Desiree, NP   Chlorhexidine Gluconate Cloth 2 % PADS 6 each, 6 each, Topical, Daily, Aroor, Lanice Schwab, MD, 6 each at 06/22/19 1044   clonazePAM (KLONOPIN) disintegrating tablet 0.5 mg, 0.5 mg, Oral, QHS, Rosalin Hawking, MD, 0.5 mg at 06/21/19 2215   insulin aspart (  novoLOG) injection 0-15 Units, 0-15 Units, Subcutaneous, TID WC, Greta Doom, MD, 3 Units at 06/22/19 0631   insulin detemir (LEVEMIR) injection 10 Units, 10 Units, Subcutaneous, BID, Rosalin Hawking, MD, 10 Units at 06/22/19 1043   labetalol (NORMODYNE) injection 5-20 mg, 5-20 mg, Intravenous, Q2H PRN, Rosalin Hawking, MD, 20 mg at 06/20/19 2314   lisinopril (ZESTRIL) tablet 20 mg, 20 mg, Oral, Daily, Rosalin Hawking, MD, 20 mg at 06/22/19 1044   metoprolol succinate (TOPROL-XL) 24 hr tablet 50 mg, 50 mg, Oral, Daily, Sarina Ill B, MD, 50 mg at 06/22/19 1044   ondansetron (ZOFRAN) tablet 4 mg, 4 mg, Oral, Q6H PRN **OR** ondansetron (ZOFRAN) injection 4 mg, 4 mg, Intravenous, Q6H PRN, Sarina Ill B, MD, 4 mg at 06/19/19 1238   pantoprazole (PROTONIX) EC tablet 40 mg, 40 mg, Oral, QHS, Sarina Ill B, MD, 40 mg at 06/21/19 2214   QUEtiapine (SEROQUEL) tablet 25 mg, 25 mg, Oral, QHS, Rosalin Hawking, MD, 25 mg at 06/21/19 1733   senna-docusate (Senokot-S) tablet 1 tablet, 1 tablet, Oral, BID, Metzger-Cihelka, Desiree, NP, 1 tablet at 06/22/19 1044   sertraline (ZOLOFT) tablet 50 mg, 50 mg, Oral, Daily, Sarina Ill B, MD, 50 mg at 06/22/19 1044  Patients Current Diet:  Diet Order            Diet heart  healthy/carb modified Room service appropriate? Yes with Assist; Fluid consistency: Thin  Diet effective now              Precautions / Restrictions Precautions Precautions: Fall Precaution Comments: L hemianopsia, L inattention, apraxia Restrictions Weight Bearing Restrictions: No   Has the patient had 2 or more falls or a fall with injury in the past year?Yes  Prior Activity Level Community (5-7x/wk): Independent and driving  Prior Functional Level Prior Function Level of Independence: Independent Gait / Transfers Assistance Needed: walks no devices, fell backwards last Saturday coming in the door, fell PTA ADL's / Homemaking Assistance Needed: uses shower chair; helps with chores, likes to water her flowers; states she did her finances, shopping etc, but per chart needed assistance Comments: drives  Self Care: Did the patient need help bathing, dressing, using the toilet or eating?  Independent  Indoor Mobility: Did the patient need assistance with walking from room to room (with or without device)? Independent  Stairs: Did the patient need assistance with internal or external stairs (with or without device)? Independent  Functional Cognition: Did the patient need help planning regular tasks such as shopping or remembering to take medications? Independent  Home Assistive Devices / Equipment Home Assistive Devices/Equipment: Eyeglasses, Civil engineer, contracting with back, Radio producer (specify quad or straight) Home Equipment: Walker - 2 wheels, Cane - single point, Shower seat, Hand held shower head  Prior Device Use: Indicate devices/aids used by the patient prior to current illness, exacerbation or injury? Walker  Current Functional Level Cognition  Arousal/Alertness: Awake/alert Overall Cognitive Status: Impaired/Different from baseline Current Attention Level: Sustained Orientation Level: Oriented X4 Following Commands: Follows one step commands consistently, Follows multi-step  commands with increased time Safety/Judgement: Decreased awareness of safety, Decreased awareness of deficits General Comments: needs multimodal cues to find objects and room number to L of midline Attention: Focused, Sustained Focused Attention: Appears intact Sustained Attention: Appears intact Memory: Impaired Memory Impairment: Decreased long term memory Decreased Long Term Memory: Verbal basic Awareness: Impaired Awareness Impairment: Intellectual impairment, Emergent impairment Problem Solving: Impaired Problem Solving Impairment: Functional basic    Extremity Assessment (includes Sensation/Coordination)  Upper  Extremity Assessment: LUE deficits/detail LUE Deficits / Details: Lt UE Brunnstrom end stage IV.  Pt with decreased awareness of Lt UE reqiring mod cues to maintain Lt UE on walker  LUE Sensation: decreased proprioception LUE Coordination: decreased fine motor, decreased gross motor  Lower Extremity Assessment: Defer to PT evaluation LLE Deficits / Details: lifts antigravity, functional weakness/coordination noted with ambulation LLE Coordination: decreased gross motor    ADLs  Overall ADL's : Needs assistance/impaired Eating/Feeding: Supervision/ safety, Sitting(mod cues ) Eating/Feeding Details (indicate cue type and reason): assist for set up and cues to locate food items  Grooming: Wash/dry hands, Wash/dry face, Oral care, Brushing hair, Moderate assistance, Standing Upper Body Bathing: Moderate assistance, Sitting Lower Body Bathing: Maximal assistance, Sit to/from stand Upper Body Dressing : Moderate assistance, Sitting Lower Body Dressing: Total assistance, Sit to/from stand Lower Body Dressing Details (indicate cue type and reason): Pt attempted to don socks, but unable to access feet, and frequently dropped sock when holding it in Lt UE  Toilet Transfer: Moderate assistance, +2 for physical assistance, +2 for safety/equipment, Ambulation, Comfort height toilet,  RW Toileting- Clothing Manipulation and Hygiene: Maximal assistance, Sit to/from stand Functional mobility during ADLs: Moderate assistance, +2 for physical assistance, +2 for safety/equipment, Rolling walker    Mobility  Overal bed mobility: Needs Assistance Bed Mobility: Rolling, Sidelying to Sit Rolling: Supervision Sidelying to sit: Min assist Supine to sit: HOB elevated, Min assist General bed mobility comments: up to R side of bed with increased time, pt struggling so cues given and A to lift trunk    Transfers  Overall transfer level: Needs assistance Equipment used: Rolling walker (2 wheeled) Transfers: Sit to/from Stand Sit to Stand: Min assist Stand pivot transfers: Mod assist, +2 physical assistance, +2 safety/equipment General transfer comment: assist to stand pt pulling up on RW bed higher than her feet can reach the ground    Ambulation / Gait / Stairs / Wheelchair Mobility  Ambulation/Gait Ambulation/Gait assistance: Mod assist Gait Distance (Feet): 100 Feet(x 2) Assistive device: Rolling walker (2 wheeled) Gait Pattern/deviations: Step-through pattern, Decreased stride length, Drifts right/left General Gait Details: cues for eyes on visual target for forward gaze and straight path in hallway assist for balance and to keep walker moving straight as pt veers to R consistently when not assisted; assist for safety on turns with walker management and cues for obtaining new visual target    Posture / Balance Dynamic Sitting Balance Sitting balance - Comments: able to maintain static sitting with close min guard assist  Balance Overall balance assessment: Needs assistance Sitting-balance support: Feet unsupported, Feet supported Sitting balance-Leahy Scale: Fair Sitting balance - Comments: able to maintain static sitting with close min guard assist  Standing balance support: Bilateral upper extremity supported Standing balance-Leahy Scale: Poor Standing balance comment:  L hand falls off walker frequently needing cues and assist to replace as LOB at times when hand falls off    Special needs/care consideration   Designated visitors are [name] and [place]: (Lives with daughter and adult granddaughter)  Lives With: (daughter and adult granddaughter) Available Help at Discharge: Family, Friend(s), Available 24 hours/day(family works day shift; has friends to come stay when they a) Type of Home: House Home Layout: One level Home Access: Stairs to enter Entrance Stairs-Rails: None Technical brewer of Steps: 2 Bathroom Shower/Tub: Chiropodist: Standard Bathroom Accessibility: Yes How Accessible: Accessible via walker [place]: No  Discharge Living Setting  Plans for Discharge Living Setting: Patient's home, Lives with (comment)(daughter and adult granddaughter) Type of Home at Discharge: House Discharge Home Layout: One level Discharge Home Access: Stairs to enter Entrance Stairs-Rails: None Entrance Stairs-Number of Steps: 2 Discharge Bathroom Shower/Tub: Tub/shower unit Discharge Bathroom Toilet: Standard Discharge Bathroom Accessibility: Yes How Accessible: Accessible via walker Does the patient have any problems obtaining your medications?: No  Social/Family/Support Systems Patient Roles: Parent Contact Information: daughter, [name] Anticipated Caregiver: daughter, family and friends Anticipated Caregiver's Contact Information: see above Ability/Limitations of Caregiver: daughter works 6 pm until 6 am 2 to 3 days per week. Has family friend who can asisst when family works Building control surveyor Availability: 24/7 Discharge Plan Discussed with Primary Caregiver: Yes Is Caregiver In Agreement with Plan?: Yes Does Caregiver/Family have Issues with Lodging/Transportation while Pt is in Rehab?: No  Goals Patient/Family Goal for Rehab: supervision to min assist with PT, OT, and  SLP Expected length of stay: ELOS 2 to 2 .5 weeks Pt/Family Agrees to Admission and willing to participate: Yes Program Orientation Provided & Reviewed with Pt/Caregiver Including Roles  & Responsibilities: Yes  Decrease burden of Care through IP rehab admission: n/a  Possible need for SNF placement upon discharge: not anticipated  Patient Condition: This patient's condition remains as documented in the consult dated 06/20/2019, in which the Rehabilitation Physician determined and documented that the patient's condition is appropriate for intensive rehabilitative care in an inpatient rehabilitation facility. Will admit to inpatient rehab today.  Preadmission Screen Completed By:  Cleatrice Burke, RN, 06/22/2019 12:35 PM ______________________________________________________________________   Discussed status with Dr. Naaman Plummer on 06/22/2019 at  1235 and received approval for admission today.  Admission Coordinator:  Cleatrice Burke, time 2952 Date 06/22/2019

## 2019-06-22 ENCOUNTER — Encounter (HOSPITAL_COMMUNITY): Payer: Self-pay | Admitting: Physical Medicine & Rehabilitation

## 2019-06-22 ENCOUNTER — Other Ambulatory Visit: Payer: Self-pay

## 2019-06-22 ENCOUNTER — Inpatient Hospital Stay (HOSPITAL_COMMUNITY)
Admission: RE | Admit: 2019-06-22 | Discharge: 2019-07-05 | DRG: 057 | Disposition: A | Discharge status: Home Health | Payer: Medicare HMO | Source: Intra-hospital | Attending: Physical Medicine & Rehabilitation | Admitting: Physical Medicine & Rehabilitation

## 2019-06-22 DIAGNOSIS — I639 Cerebral infarction, unspecified: Secondary | ICD-10-CM | POA: Diagnosis not present

## 2019-06-22 DIAGNOSIS — Z981 Arthrodesis status: Secondary | ICD-10-CM

## 2019-06-22 DIAGNOSIS — E1169 Type 2 diabetes mellitus with other specified complication: Secondary | ICD-10-CM

## 2019-06-22 DIAGNOSIS — R296 Repeated falls: Secondary | ICD-10-CM | POA: Diagnosis present

## 2019-06-22 DIAGNOSIS — I10 Essential (primary) hypertension: Secondary | ICD-10-CM

## 2019-06-22 DIAGNOSIS — E11649 Type 2 diabetes mellitus with hypoglycemia without coma: Secondary | ICD-10-CM | POA: Diagnosis present

## 2019-06-22 DIAGNOSIS — G8929 Other chronic pain: Secondary | ICD-10-CM | POA: Diagnosis present

## 2019-06-22 DIAGNOSIS — Z823 Family history of stroke: Secondary | ICD-10-CM | POA: Diagnosis not present

## 2019-06-22 DIAGNOSIS — E11319 Type 2 diabetes mellitus with unspecified diabetic retinopathy without macular edema: Secondary | ICD-10-CM

## 2019-06-22 DIAGNOSIS — R6883 Chills (without fever): Secondary | ICD-10-CM

## 2019-06-22 DIAGNOSIS — R509 Fever, unspecified: Secondary | ICD-10-CM | POA: Diagnosis not present

## 2019-06-22 DIAGNOSIS — I69154 Hemiplegia and hemiparesis following nontraumatic intracerebral hemorrhage affecting left non-dominant side: Principal | ICD-10-CM

## 2019-06-22 DIAGNOSIS — M545 Low back pain: Secondary | ICD-10-CM | POA: Diagnosis present

## 2019-06-22 DIAGNOSIS — E785 Hyperlipidemia, unspecified: Secondary | ICD-10-CM

## 2019-06-22 DIAGNOSIS — E1122 Type 2 diabetes mellitus with diabetic chronic kidney disease: Secondary | ICD-10-CM | POA: Diagnosis present

## 2019-06-22 DIAGNOSIS — D7589 Other specified diseases of blood and blood-forming organs: Secondary | ICD-10-CM | POA: Diagnosis present

## 2019-06-22 DIAGNOSIS — Z825 Family history of asthma and other chronic lower respiratory diseases: Secondary | ICD-10-CM | POA: Diagnosis not present

## 2019-06-22 DIAGNOSIS — N183 Chronic kidney disease, stage 3 unspecified: Secondary | ICD-10-CM | POA: Diagnosis present

## 2019-06-22 DIAGNOSIS — E8809 Other disorders of plasma-protein metabolism, not elsewhere classified: Secondary | ICD-10-CM | POA: Diagnosis present

## 2019-06-22 DIAGNOSIS — E1165 Type 2 diabetes mellitus with hyperglycemia: Secondary | ICD-10-CM

## 2019-06-22 DIAGNOSIS — E876 Hypokalemia: Secondary | ICD-10-CM

## 2019-06-22 DIAGNOSIS — E46 Unspecified protein-calorie malnutrition: Secondary | ICD-10-CM

## 2019-06-22 DIAGNOSIS — Z833 Family history of diabetes mellitus: Secondary | ICD-10-CM

## 2019-06-22 DIAGNOSIS — R0989 Other specified symptoms and signs involving the circulatory and respiratory systems: Secondary | ICD-10-CM

## 2019-06-22 DIAGNOSIS — N179 Acute kidney failure, unspecified: Secondary | ICD-10-CM

## 2019-06-22 DIAGNOSIS — M810 Age-related osteoporosis without current pathological fracture: Secondary | ICD-10-CM | POA: Diagnosis present

## 2019-06-22 DIAGNOSIS — I129 Hypertensive chronic kidney disease with stage 1 through stage 4 chronic kidney disease, or unspecified chronic kidney disease: Secondary | ICD-10-CM | POA: Diagnosis present

## 2019-06-22 DIAGNOSIS — I611 Nontraumatic intracerebral hemorrhage in hemisphere, cortical: Secondary | ICD-10-CM | POA: Diagnosis not present

## 2019-06-22 DIAGNOSIS — E669 Obesity, unspecified: Secondary | ICD-10-CM

## 2019-06-22 DIAGNOSIS — H53461 Homonymous bilateral field defects, right side: Secondary | ICD-10-CM | POA: Diagnosis present

## 2019-06-22 DIAGNOSIS — H53462 Homonymous bilateral field defects, left side: Secondary | ICD-10-CM | POA: Diagnosis present

## 2019-06-22 DIAGNOSIS — R339 Retention of urine, unspecified: Secondary | ICD-10-CM | POA: Diagnosis present

## 2019-06-22 DIAGNOSIS — J849 Interstitial pulmonary disease, unspecified: Secondary | ICD-10-CM | POA: Diagnosis present

## 2019-06-22 DIAGNOSIS — R4189 Other symptoms and signs involving cognitive functions and awareness: Secondary | ICD-10-CM | POA: Diagnosis present

## 2019-06-22 DIAGNOSIS — D62 Acute posthemorrhagic anemia: Secondary | ICD-10-CM | POA: Diagnosis present

## 2019-06-22 DIAGNOSIS — G4700 Insomnia, unspecified: Secondary | ICD-10-CM | POA: Diagnosis present

## 2019-06-22 DIAGNOSIS — F411 Generalized anxiety disorder: Secondary | ICD-10-CM | POA: Diagnosis not present

## 2019-06-22 DIAGNOSIS — I69112 Visuospatial deficit and spatial neglect following nontraumatic intracerebral hemorrhage: Secondary | ICD-10-CM

## 2019-06-22 DIAGNOSIS — F419 Anxiety disorder, unspecified: Secondary | ICD-10-CM | POA: Diagnosis present

## 2019-06-22 DIAGNOSIS — N1832 Chronic kidney disease, stage 3b: Secondary | ICD-10-CM | POA: Diagnosis not present

## 2019-06-22 HISTORY — DX: Nontraumatic intracerebral hemorrhage in hemisphere, cortical: I61.1

## 2019-06-22 LAB — GLUCOSE, CAPILLARY
Glucose-Capillary: 165 mg/dL — ABNORMAL HIGH (ref 70–99)
Glucose-Capillary: 154 mg/dL — ABNORMAL HIGH (ref 70–99)
Glucose-Capillary: 212 mg/dL — ABNORMAL HIGH (ref 70–99)
Glucose-Capillary: 232 mg/dL — ABNORMAL HIGH (ref 70–99)
Glucose-Capillary: 242 mg/dL — ABNORMAL HIGH (ref 70–99)
Glucose-Capillary: 162 mg/dL — ABNORMAL HIGH (ref 70–99)

## 2019-06-22 LAB — BASIC METABOLIC PANEL
Anion gap: 9 (ref 5–15)
BUN: 17 mg/dL (ref 8–23)
CO2: 19 mmol/L — ABNORMAL LOW (ref 22–32)
Calcium: 8.3 mg/dL — ABNORMAL LOW (ref 8.9–10.3)
Chloride: 106 mmol/L (ref 98–111)
Creatinine, Ser: 1.3 mg/dL — ABNORMAL HIGH (ref 0.44–1.00)
GFR calc Af Amer: 44 mL/min — ABNORMAL LOW (ref >60)
GFR calc non Af Amer: 38 mL/min — ABNORMAL LOW (ref >60)
Glucose, Bld: 197 mg/dL — ABNORMAL HIGH (ref 70–99)
Potassium: 4 mmol/L (ref 3.5–5.1)
Sodium: 134 mmol/L — ABNORMAL LOW (ref 135–145)

## 2019-06-22 LAB — CBC
HCT: 30.8 % — ABNORMAL LOW (ref 36.0–46.0)
Hemoglobin: 10.1 g/dL — ABNORMAL LOW (ref 12.0–15.0)
MCH: 33.1 pg (ref 26.0–34.0)
MCHC: 32.8 g/dL (ref 30.0–36.0)
MCV: 101 fL — ABNORMAL HIGH (ref 80.0–100.0)
Platelets: 201 10*3/uL (ref 150–400)
RBC: 3.05 MIL/uL — ABNORMAL LOW (ref 3.87–5.11)
RDW: 11.5 % (ref 11.5–15.5)
WBC: 8 10*3/uL (ref 4.0–10.5)
nRBC: 0 % (ref 0.0–0.2)

## 2019-06-22 MED ORDER — SENNOSIDES-DOCUSATE SODIUM 8.6-50 MG PO TABS: 1.0000 | ORAL_TABLET | Freq: Two times a day (BID) | ORAL | Status: DC

## 2019-06-22 MED ORDER — PROCHLORPERAZINE 25 MG RE SUPP: 12.5000 mg | Freq: Four times a day (QID) | RECTAL | Status: DC | PRN

## 2019-06-22 MED ORDER — ENOXAPARIN SODIUM 30 MG/0.3ML ~~LOC~~ SOLN
30.0000 mg | SUBCUTANEOUS | Status: DC
  Administered 2019-06-22 – 2019-07-04 (×13): 30 mg via SUBCUTANEOUS
  Filled 2019-06-22 (×13): qty 0.3

## 2019-06-22 MED ORDER — LISINOPRIL 20 MG PO TABS
20.0000 mg | ORAL_TABLET | Freq: Every day | ORAL | Status: DC
  Administered 2019-06-23 – 2019-06-25 (×3): 20 mg via ORAL
  Filled 2019-06-22 (×3): qty 1

## 2019-06-22 MED ORDER — TRAZODONE HCL 50 MG PO TABS: 25.0000 mg | ORAL_TABLET | Freq: Every evening | ORAL | Status: DC | PRN

## 2019-06-22 MED ORDER — FAMOTIDINE 20 MG PO TABS
20.0000 mg | ORAL_TABLET | Freq: Every day | ORAL | Status: DC
  Administered 2019-06-22 – 2019-07-04 (×13): 20 mg via ORAL
  Filled 2019-06-22 (×13): qty 1

## 2019-06-22 MED ORDER — INSULIN ASPART 100 UNIT/ML ~~LOC~~ SOLN
0.0000 [IU] | Freq: Every day | SUBCUTANEOUS | Status: DC
  Administered 2019-06-23 – 2019-06-25 (×3): 2 [IU] via SUBCUTANEOUS

## 2019-06-22 MED ORDER — CLONAZEPAM 0.25 MG PO TBDP
0.5000 mg | ORAL_TABLET | Freq: Every day | ORAL | Status: DC
  Administered 2019-06-22 – 2019-07-04 (×12): 0.5 mg via ORAL
  Filled 2019-06-22 (×13): qty 2

## 2019-06-22 MED ORDER — INSULIN DETEMIR 100 UNIT/ML ~~LOC~~ SOLN: 10.0000 [IU] | Freq: Two times a day (BID) | SUBCUTANEOUS | 11 refills | Status: DC

## 2019-06-22 MED ORDER — SODIUM CHLORIDE 0.9 % IV SOLN: 50.0000 mL | INTRAVENOUS | 0 refills | Status: DC

## 2019-06-22 MED ORDER — INSULIN ASPART 100 UNIT/ML ~~LOC~~ SOLN: 0.0000 [IU] | Freq: Three times a day (TID) | SUBCUTANEOUS | 11 refills | Status: DC

## 2019-06-22 MED ORDER — DIPHENHYDRAMINE HCL 12.5 MG/5ML PO ELIX: 12.5000 mg | ORAL_SOLUTION | Freq: Four times a day (QID) | ORAL | Status: DC | PRN

## 2019-06-22 MED ORDER — PROCHLORPERAZINE EDISYLATE 10 MG/2ML IJ SOLN: 5.0000 mg | Freq: Four times a day (QID) | INTRAMUSCULAR | Status: DC | PRN

## 2019-06-22 MED ORDER — CLONAZEPAM 0.5 MG PO TBDP: 0.5000 mg | ORAL_TABLET | Freq: Every day | ORAL | 0 refills | Status: DC

## 2019-06-22 MED ORDER — POLYETHYLENE GLYCOL 3350 17 G PO PACK: 17.0000 g | PACK | Freq: Every day | ORAL | Status: DC | PRN

## 2019-06-22 MED ORDER — QUETIAPINE FUMARATE 25 MG PO TABS
25.0000 mg | ORAL_TABLET | Freq: Every day | ORAL | Status: DC
  Administered 2019-06-22 – 2019-07-04 (×13): 25 mg via ORAL
  Filled 2019-06-22 (×13): qty 1

## 2019-06-22 MED ORDER — ALUM & MAG HYDROXIDE-SIMETH 200-200-20 MG/5ML PO SUSP: 30.0000 mL | ORAL | Status: DC | PRN

## 2019-06-22 MED ORDER — INSULIN DETEMIR 100 UNIT/ML ~~LOC~~ SOLN
10.0000 [IU] | Freq: Two times a day (BID) | SUBCUTANEOUS | Status: DC
  Administered 2019-06-22 – 2019-06-24 (×4): 10 [IU] via SUBCUTANEOUS
  Filled 2019-06-22 (×6): qty 0.1

## 2019-06-22 MED ORDER — ACETAMINOPHEN 325 MG PO TABS
325.0000 mg | ORAL_TABLET | ORAL | Status: DC | PRN
  Administered 2019-06-23 – 2019-06-30 (×9): 650 mg via ORAL
  Administered 2019-07-01: 325 mg via ORAL
  Administered 2019-07-03: 650 mg via ORAL
  Filled 2019-06-22 (×6): qty 2
  Filled 2019-06-22: qty 1
  Filled 2019-06-22 (×3): qty 2

## 2019-06-22 MED ORDER — BISACODYL 10 MG RE SUPP: 10.0000 mg | Freq: Every day | RECTAL | Status: DC | PRN

## 2019-06-22 MED ORDER — PANTOPRAZOLE SODIUM 40 MG PO TBEC
40.0000 mg | DELAYED_RELEASE_TABLET | Freq: Every day | ORAL | Status: DC
  Administered 2019-06-23 – 2019-07-05 (×13): 40 mg via ORAL
  Filled 2019-06-22 (×13): qty 1

## 2019-06-22 MED ORDER — METOPROLOL SUCCINATE ER 50 MG PO TB24
50.0000 mg | ORAL_TABLET | Freq: Every day | ORAL | Status: DC
  Administered 2019-06-23 – 2019-07-05 (×13): 50 mg via ORAL
  Filled 2019-06-22 (×13): qty 1

## 2019-06-22 MED ORDER — GUAIFENESIN-DM 100-10 MG/5ML PO SYRP: 5.0000 mL | ORAL_SOLUTION | Freq: Four times a day (QID) | ORAL | Status: DC | PRN

## 2019-06-22 MED ORDER — FLEET ENEMA 7-19 GM/118ML RE ENEM: 1.0000 | ENEMA | Freq: Once | RECTAL | Status: DC | PRN

## 2019-06-22 MED ORDER — METOPROLOL SUCCINATE ER 50 MG PO TB24: 50.0000 mg | ORAL_TABLET | Freq: Every day | ORAL | Status: DC

## 2019-06-22 MED ORDER — SENNA 8.6 MG PO TABS
2.0000 | ORAL_TABLET | Freq: Every day | ORAL | Status: DC
  Administered 2019-06-22: 17.2 mg via ORAL
  Filled 2019-06-22: qty 2

## 2019-06-22 MED ORDER — LISINOPRIL 20 MG PO TABS: 20.0000 mg | ORAL_TABLET | Freq: Every day | ORAL | Status: DC

## 2019-06-22 MED ORDER — INSULIN ASPART 100 UNIT/ML ~~LOC~~ SOLN
0.0000 [IU] | Freq: Three times a day (TID) | SUBCUTANEOUS | Status: DC
  Administered 2019-06-23 (×2): 3 [IU] via SUBCUTANEOUS
  Administered 2019-06-23 – 2019-06-24 (×2): 2 [IU] via SUBCUTANEOUS
  Administered 2019-06-24: 3 [IU] via SUBCUTANEOUS
  Administered 2019-06-24: 2 [IU] via SUBCUTANEOUS
  Administered 2019-06-25: 7 [IU] via SUBCUTANEOUS
  Administered 2019-06-25: 2 [IU] via SUBCUTANEOUS
  Administered 2019-06-25: 3 [IU] via SUBCUTANEOUS
  Administered 2019-06-26: 2 [IU] via SUBCUTANEOUS
  Administered 2019-06-26: 3 [IU] via SUBCUTANEOUS
  Administered 2019-06-26 – 2019-06-27 (×2): 5 [IU] via SUBCUTANEOUS
  Administered 2019-06-27: 1 [IU] via SUBCUTANEOUS
  Administered 2019-06-27 – 2019-06-28 (×2): 2 [IU] via SUBCUTANEOUS
  Administered 2019-06-28: 3 [IU] via SUBCUTANEOUS
  Administered 2019-06-28: 5 [IU] via SUBCUTANEOUS
  Administered 2019-06-29 (×3): 3 [IU] via SUBCUTANEOUS
  Administered 2019-06-30: 2 [IU] via SUBCUTANEOUS
  Administered 2019-06-30: 5 [IU] via SUBCUTANEOUS
  Administered 2019-06-30 – 2019-07-01 (×3): 2 [IU] via SUBCUTANEOUS
  Administered 2019-07-01: 1 [IU] via SUBCUTANEOUS
  Administered 2019-07-02 – 2019-07-03 (×2): 2 [IU] via SUBCUTANEOUS
  Administered 2019-07-03: 1 [IU] via SUBCUTANEOUS

## 2019-06-22 MED ORDER — PROCHLORPERAZINE MALEATE 5 MG PO TABS: 5.0000 mg | ORAL_TABLET | Freq: Four times a day (QID) | ORAL | Status: DC | PRN

## 2019-06-22 MED ORDER — LIDOCAINE HCL URETHRAL/MUCOSAL 2 % EX GEL: CUTANEOUS | Status: DC | PRN

## 2019-06-22 MED ORDER — FAMOTIDINE 20 MG PO TABS: 40.0000 mg | ORAL_TABLET | Freq: Every day | ORAL | Status: DC

## 2019-06-22 MED ORDER — QUETIAPINE FUMARATE 25 MG PO TABS: 25.0000 mg | ORAL_TABLET | Freq: Every day | ORAL | Status: DC

## 2019-06-22 MED ORDER — CHLORHEXIDINE GLUCONATE CLOTH 2 % EX PADS: 6.0000 | MEDICATED_PAD | Freq: Every day | CUTANEOUS | Status: DC

## 2019-06-22 NOTE — Progress Notes (Signed)
Courtney Heys, MD  Physician  Physical Medicine and Rehabilitation  Consult Note      Signed  Date of Service:  06/20/2019  8:35 AM      Related encounter: ED to Hosp-Admission (Current) from 06/18/2019 in [place] [facility]      Signed      Expand AllCollapse All   Show:Clear all [x] Manual[x] Template[] Copied  Added by: [x] Love, Ivan Anchors, PA-C[x] Courtney Heys, MD  [] Hover for details          Physical Medicine and Rehabilitation Consult     Reason for Consult: [place] with functional deficits.  Referring Physician: Dr. Jaynee Eagles.      HPI: Sandra [name] is a 86 y.o. RH- female with history of prior hemorrhagic stroke and TIA, T2DM with retinopathy, CKD (Dr. Ambrose Pancoast), interstitial lung disease, chronic LBP, fall off her porch 06/06/19 with mild injuries. She had recurrent fall on 05/29 am with reports of HA and right elbow pain-- presented to ED that evening but left due to wait time. She was admitted on 06/18/19 am with reports of confusion, HA, dizziness and difficulty walking. She was found to have left hemianopsia with mild neglect and CT head done revealing focal hemorrhage in mid occipital lobe likely from hemorrhagic infarct and mild cytotoxic edema with small vessel disease.   MRI brain done showing hematoma in right posterior temporoparietal lobe containing methemoglobin and extension into subdural space with small SDH and evidence of prior microhemorrhages in left frontal and left parietal lobe likely due to hypertensive bleed.    Neurology questioned hypertension v/s trauma due to multiple falls v/s amyloid as cause of bleed.  MRA ordered for work up and ASA d/c due to bleed. On cleveprex with SBP goal < 140.  2 D echo done revealing EF 60-65% with grade 1 diastolic dysfunction and mild-moderate aortic valve sclerosis.  Therapy evaluations completed revealing decreased awareness of deficits, left hemianopsia  with left inattention as well as gait disorder with fear of  falling. CIR recommended due to functional decline.    Pt reports LBM was this AM- feels SOB with a lot of movement- has foley per pt.        Review of Systems  Constitutional: Negative for chills and fever.  HENT: Negative for hearing loss and tinnitus.   Eyes:       Decreased vision.   Respiratory: Positive for shortness of breath. Negative for cough and wheezing.   Cardiovascular: Negative for chest pain and palpitations.  Gastrointestinal: Negative for heartburn.  Genitourinary: Negative for dysuria and urgency.  Musculoskeletal: Positive for back pain (chronic for years).  Skin: Negative for rash.  Neurological: Positive for sensory change and focal weakness.  Psychiatric/Behavioral: Positive for memory loss. The patient is not nervous/anxious.   All other systems reviewed and are negative.         Past Medical History:  Diagnosis Date  . Abnormal blood finding 02/10/2016  . Abrasion of ear canal 09/28/2012  . Achilles tendon injury 11/27/2010  . ACID REFLUX DISEASE 05/12/2006    Qualifier: Diagnosis of  By: Larose Kells MD, [place] Acute MI ([place])    . ALLERGIC RHINITIS 05/12/2006    Qualifier: Diagnosis of  By: Larose Kells MD, [place] Anxiety state 07/03/2009    Qualifier: Diagnosis of  By: Larose Kells MD, [place] pain 07/01/2010  . Chronic kidney disease (CKD), stage III (moderate) ([place]) 05/31/2014  . Chronic right SI joint pain  09/22/2013  . DEGENERATIVE JOINT DISEASE, CERVICAL SPINE 06/23/2006    Annotation: had a CAT scan with a cervical myelogram that show  left C6-7 and  C5-6 foraminal stenosis Qualifier: Diagnosis of  By: Larose Kells MD, [place] DEPRESSION 05/12/2006    Qualifier: Diagnosis of  By: Larose Kells MD, [place] DIABETES MELLITUS, TYPE II 05/12/2006    Now following w/ Endo at [facility] 04/27/2007    Qualifier: Diagnosis of  By: Jerold Coombe    . Dyspnea 02/10/2016  . Encephalopathy, hypertensive    . Essential hypertension 05/12/2006    Qualifier:  Diagnosis of  By: Larose Kells MD, [place] Fatigue 05/31/2014  . GAIT DISTURBANCE 08/07/2009    Qualifier: Diagnosis of  By: Larose Kells MD, [place] GERD (gastroesophageal reflux disease)    . Headache(784.0) 11/27/2010  . Hyperlipidemia    . Hyperlipidemia associated with type 2 diabetes mellitus ([place]) 05/12/2006    Qualifier: Diagnosis of  By: Larose Kells MD, [place] ILD (interstitial lung disease) ([place]) 05/31/2015  . Leukocytopenia 05/31/2014  . Lower abdominal pain 05/17/2014  . Nausea with vomiting 09/22/2013  . NECK PAIN, CHRONIC 04/03/2008    Qualifier: Diagnosis of  By: Larose Kells MD, [place] Nodule on liver 12/06/2015  . Osteoarthritis 02/10/2016  . Osteopenia    . Osteoporosis 06/23/2006    Annotation: had a bone density test in 08-2004.  T score was -2.4 Qualifier: Diagnosis of  By: Larose Kells MD, [place] SAH (subarachnoid hemorrhage) ([place])    . Sciatica    . Scleroderma ([place]) 02/28/2016  . Severe hypertension 03/18/2018  . Slurred speech 11/27/2010  . Takotsubo cardiomyopathy      s/p stress MI with stress induced CM with normal coronary arteries by cath 2007 with normalization of LVF by echo 04/2005  . TIA (transient ischemic attack)    . Unspecified cerebral artery occlusion with cerebral infarction 04/07/2012  . UTI (urinary tract infection) 11/02/2011  . 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 Vitamin D deficiency 10/01/2014           Past Surgical History:  Procedure Laterality Date  . ABDOMINAL HYSTERECTOMY   1977    no oophorectomy  . APPENDECTOMY      . CARDIAC CATHETERIZATION      . CATARACT EXTRACTION, BILATERAL   2011  . SPINAL FUSION   2000    Dr. Vertell Limber  . TONSILLECTOMY               Family History  Problem Relation Age of Onset  . Intracerebral hemorrhage Daughter          assoc with post-partum  . Stroke Mother    . Diabetes Sister    . Asthma Sister    . Diabetes Sister    . Breast cancer Other          ?Aunt  . Cancer Other          breast?  . Coronary artery disease Neg Hx         Social History: Widowed. Retired accountant--worked till 2 years ago. Her daughter/grand-daughter live with her.  She was independent without AD--still drives. She  reports that she is a non-smoker but has been exposed to tobacco smoke. She has never used smokeless tobacco. She reports current alcohol use of about 1.0 standard drinks of alcohol per  week. She reports that she does not use drugs.           Allergies  Allergen Reactions  . Cephalexin Nausea And Vomiting      Pt stated made severely sick, will never take again  . Nintedanib Diarrhea  . Pioglitazone Other (See Comments)      REACTION: EDEMA            Medications Prior to Admission  Medication Sig Dispense Refill  . aspirin 81 MG tablet Take 81 mg by mouth daily.      Marland Kitchen atorvastatin (LIPITOR) 40 MG tablet Take 1 tablet (40 mg total) by mouth daily at 6 PM. 30 tablet 0  . b complex vitamins tablet Take 1 tablet by mouth daily.      . Cholecalciferol (VITAMIN D) 125 MCG (5000 UT) CAPS Take 5,000 Units by mouth daily.      . clonazePAM (KLONOPIN) 0.5 MG tablet TAKE 1 TABLET TWICE DAILY AS NEEDED FOR ANXIETY (Patient taking differently: Take 0.5 mg by mouth 2 (two) times daily as needed for anxiety. ) 90 tablet 1  . Cyanocobalamin (VITAMIN B-12 PO) Take 1 tablet by mouth daily.       . famotidine (PEPCID) 40 MG tablet Take 40 mg by mouth daily.       . fenofibrate 54 MG tablet Take 54 mg by mouth daily.      Marland Kitchen glimepiride (AMARYL) 4 MG tablet Take 2 mg by mouth 2 (two) times daily.    2  . Insulin NPH, Human,, Isophane, (HUMULIN N) 100 UNIT/ML Kiwkpen Inject into the skin. Take 36 units am and 6 units pm      . insulin regular (NOVOLIN R) 100 units/mL injection Inject 23-27 Units into the skin See admin instructions. 23 units before Breakfast and lunch, 27 units before dinner.      Marland Kitchen lisinopril (PRINIVIL,ZESTRIL) 10 MG tablet Take 10 mg by mouth daily.       . metoprolol succinate (TOPROL-XL) 50 MG 24 hr tablet TAKE 1  TABLET EVERY DAY.  TAKE  WITH  OR  IMMEDIATELY FOLLOWING A MEAL  (Patient taking differently: Take 50 mg by mouth daily. ) 90 tablet 1  . sertraline (ZOLOFT) 50 MG tablet Take 50 mg by mouth daily.      . Turmeric 500 MG CAPS Take 500 mg by mouth daily.      . vitamin E (VITAMIN E) 180 MG (400 UNITS) capsule Take 400 Units by mouth daily.      Marland Kitchen ACCU-CHEK FASTCLIX LANCETS MISC        . ACCU-CHEK SMARTVIEW test strip        . HYDROcodone-acetaminophen (NORCO/VICODIN) 5-325 MG tablet Take 1 tablet by mouth every 6 (six) hours as needed for moderate pain. (Patient not taking: Reported on 06/18/2019) 45 tablet 0  . Insulin Syringe-Needle U-100 (INSULIN SYRINGE .5CC/31GX5/16") 31G X 5/16" 0.5 ML MISC 40 units in morning and 18units at night      . Nintedanib (OFEV) 100 MG CAPS Take 1 capsule (100 mg total) by mouth 2 (two) times daily after a meal. (Patient not taking: Reported on 04/24/2019) 60 capsule 5      Home: Home Living Family/patient expects to be discharged to:: Private residence Living Arrangements: Children, Other relatives Available Help at Discharge: Family, Available PRN/intermittently Type of Home: House Home Access: Stairs to enter [employer] of Steps: 2 Entrance Stairs-Rails: None Home Layout: One level Bathroom Shower/Tub: Chiropodist: Standard  Home Equipment: Environmental consultant - 2 wheels, [employer] - single point, Civil engineer, contracting, Hand held shower head  Lives With: Daughter  Functional History: Prior Function Level of Independence: Needs assistance Gait / Transfers Assistance Needed: walks no devices, fell backwards last Saturday coming in the door, fell PTA ADL's / Homemaking Assistance Needed: uses shower chair; helps with chores, likes to water her flowers; states she did her finances, shopping etc, but per chart needed assistance Comments: drives Functional Status:  Mobility: Bed Mobility Overal bed mobility: Needs Assistance Bed Mobility: Supine to  Sit Supine to sit: HOB elevated General bed mobility comments: min A overall, initially supervision up from lying with HOB elevated, but needed help to get scooted forward and for balance Transfers Overall transfer level: Needs assistance Equipment used: Rolling walker (2 wheeled) Transfers: Sit to/from Stand, Stand Pivot Transfers Sit to Stand: +2 safety/equipment, Mod assist Stand pivot transfers: Mod assist, +2 physical assistance, +2 safety/equipment General transfer comment: some lifting help from EOB Ambulation/Gait Ambulation/Gait assistance: Mod assist, +2 safety/equipment Gait Distance (Feet): 60 Feet Assistive device: Rolling walker (2 wheeled), 2 person hand held assist Gait Pattern/deviations: Decreased stride length, Drifts right/left, Trunk flexed General Gait Details: initially with RW and 2 assist for safety due to IV pole and pt not keeping L hand on walker nor going forward in straight path veering to the R; then pt anxious and fearful of falling and not able to keep walker straight so removed walker to use bilateral HHA for easier assistance, management.   ADL: ADL Overall ADL's : Needs assistance/impaired Eating/Feeding: Supervision/ safety, Sitting(mod cues ) Eating/Feeding Details (indicate cue type and reason): assist for set up and cues to locate food items  Grooming: Wash/dry hands, Wash/dry face, Oral care, Brushing hair, Moderate assistance, Standing Upper Body Bathing: Moderate assistance, Sitting Lower Body Bathing: Maximal assistance, Sit to/from stand Upper Body Dressing : Moderate assistance, Sitting Lower Body Dressing: Total assistance, Sit to/from stand Lower Body Dressing Details (indicate cue type and reason): Pt attempted to don socks, but unable to access feet, and frequently dropped sock when holding it in Lt UE  Toilet Transfer: Moderate assistance, +2 for physical assistance, +2 for safety/equipment, Ambulation, Comfort height toilet,  RW Toileting- Clothing Manipulation and Hygiene: Maximal assistance, Sit to/from stand Functional mobility during ADLs: Moderate assistance, +2 for physical assistance, +2 for safety/equipment, Rolling walker   Cognition: Cognition Overall Cognitive Status: Impaired/Different from baseline Arousal/Alertness: Awake/alert Orientation Level: Oriented X4 Attention: Focused, Sustained Focused Attention: Appears intact Sustained Attention: Appears intact Memory: Impaired Memory Impairment: Decreased long term memory Decreased Long Term Memory: Verbal basic Awareness: Impaired Awareness Impairment: Intellectual impairment, Emergent impairment Problem Solving: Impaired Problem Solving Impairment: Functional basic Cognition Arousal/Alertness: Awake/alert Behavior During Therapy: Anxious, WFL for tasks assessed/performed Overall Cognitive Status: Impaired/Different from baseline Area of Impairment: Attention, Memory, Following commands, Safety/judgement, Awareness, Problem solving Current Attention Level: Sustained Memory: Decreased short-term memory Following Commands: Follows one step commands with increased time Safety/Judgement: Decreased awareness of safety, Decreased awareness of deficits Awareness: Intellectual Problem Solving: Slow processing, Difficulty sequencing, Requires verbal cues, Requires tactile cues General Comments: Pt self distracts frequently requiring mod cues for selective attention.  Cues provided for sequencing and problem solving      Blood pressure (!) 156/59, pulse 88, temperature 98.4 F (36.9 C), resp. rate (!) 22, SpO2 99 %. Physical Exam  Nursing note and vitals reviewed. Constitutional: She is oriented to person, place, and time. She appears well-developed and well-nourished.  Sitting up in chair  at bedside; son on phone in room, appropriate, but although can answer questions, is tangential, NAD  HENT:  Head: Normocephalic and atraumatic.  Nose: Nose  normal.  Mouth/Throat: Oropharynx is clear and moist. No oropharyngeal exudate.  Smile appeared equal Tongue midline- not coated  Eyes: Conjunctivae are normal. Right eye exhibits no discharge. Left eye exhibits no discharge.  EOM very slow, but can go all the was right and left with cues No nystagmus seen  Cardiovascular:  RRR- HR 69-73 while in room  Respiratory: No stridor.  CTA B/L- no W/R/R- good air movement sats 96% on RA- RR-17  GI:  Soft, NT, ND, (+)BS    Genitourinary:    Genitourinary Comments: Foley appeared to be in place   Musculoskeletal:     Cervical back: Normal range of motion and neck supple.     Comments: LUE- 4/5 in biceps, triceps, WE< grip and finger abd LLE- 4/5 in HF, KE, DF and PF R UE and RLE 5-/5 in same muscles    Neurological: She is alert and oriented to person, place, and time.  Speech clear. Right gaze preference with left inattention Pt had mild apraxia- was able to cross midline, but didn't reach for the correct body part when asked Decreased vision in periphery L>R   Skin: Skin is warm and dry. No erythema.  Psychiatric:  Appropriate, tangential      Lab Results Last 24 Hours       Results for orders placed or performed during the hospital encounter of 06/18/19 (from the past 24 hour(s))  Glucose, capillary     Status: Abnormal    Collection Time: 06/19/19 11:25 AM  Result Value Ref Range    Glucose-Capillary 167 (H) 70 - 99 mg/dL    Comment 1 Notify RN      Comment 2 Document in Chart    Glucose, capillary     Status: Abnormal    Collection Time: 06/19/19  5:03 PM  Result Value Ref Range    Glucose-Capillary 157 (H) 70 - 99 mg/dL    Comment 1 Notify RN      Comment 2 Document in Chart    Glucose, capillary     Status: Abnormal    Collection Time: 06/19/19  9:21 PM  Result Value Ref Range    Glucose-Capillary 162 (H) 70 - 99 mg/dL    Comment 1 Notify RN      Comment 2 Document in Chart    CBC     Status: Abnormal    Collection  Time: 06/20/19  7:26 AM  Result Value Ref Range    WBC 6.5 4.0 - 10.5 K/uL    RBC 3.17 (L) 3.87 - 5.11 MIL/uL    Hemoglobin 10.6 (L) 12.0 - 15.0 g/dL    HCT 31.9 (L) 36.0 - 46.0 %    MCV 100.6 (H) 80.0 - 100.0 fL    MCH 33.4 26.0 - 34.0 pg    MCHC 33.2 30.0 - 36.0 g/dL    RDW 11.4 (L) 11.5 - 15.5 %    Platelets 236 150 - 400 K/uL    nRBC 0.0 0.0 - 0.2 %  Basic metabolic panel     Status: Abnormal    Collection Time: 06/20/19  7:26 AM  Result Value Ref Range    Sodium 138 135 - 145 mmol/L    Potassium 4.2 3.5 - 5.1 mmol/L    Chloride 107 98 - 111 mmol/L    CO2 20 (L) 22 -  32 mmol/L    Glucose, Bld 184 (H) 70 - 99 mg/dL    BUN 15 8 - 23 mg/dL    Creatinine, Ser 1.23 (H) 0.44 - 1.00 mg/dL    Calcium 8.4 (L) 8.9 - 10.3 mg/dL    GFR calc non Af Amer 41 (L) >60 mL/min    GFR calc Af Amer 47 (L) >60 mL/min    Anion gap 11 5 - 15  Lipid panel     Status: Abnormal    Collection Time: 06/20/19  7:26 AM  Result Value Ref Range    Cholesterol 143 0 - 200 mg/dL    Triglycerides 191 (H) <150 mg/dL    HDL 29 (L) >40 mg/dL    Total CHOL/HDL Ratio 4.9 RATIO    VLDL 38 0 - 40 mg/dL    LDL Cholesterol 76 0 - 99 mg/dL  Glucose, capillary     Status: Abnormal    Collection Time: 06/20/19  7:34 AM  Result Value Ref Range    Glucose-Capillary 167 (H) 70 - 99 mg/dL       Imaging Results (Last 48 hours)  CT Head Wo Contrast   Result Date: 06/18/2019 CLINICAL DATA:  Headache with nausea EXAM: CT HEAD WITHOUT CONTRAST TECHNIQUE: Contiguous axial images were obtained from the base of the skull through the vertex without intravenous contrast. COMPARISON:  March 17, 2018 FINDINGS: Brain: Ventricles and sulci are normal in size and configuration. There is a focal apparent hemorrhagic infarct arising in the mid right occipital lobe with mild surrounding edema. This focus of hemorrhage measures 3.0 x 2.6 x 4.0 cm. No other hemorrhage evident. No well-defined mass. No extra-axial fluid collection or  midline shift. There is patchy small vessel disease in the centra semiovale bilaterally. Vascular: No hyperdense vessel evident. Foci of calcification noted in each carotid siphon region. Skull: Bony calvarium appears intact. Sinuses/Orbits: Mucosal thickening noted in each maxillary antrum inferiorly. There are retention cysts in the right maxillary antrum. There is mucosal thickening in several ethmoid air cells. Orbits appear symmetric bilaterally. Other: Mastoid air cells are clear. IMPRESSION: Focal hemorrhage, presumably from hemorrhagic infarct in the mid occipital lobe measuring 3.0 x 2.6 x 4.0 cm. Mild surrounding cytotoxic appearing edema. No other hemorrhage evident. Elsewhere there is small vessel disease within portions of the centra semiovale bilaterally. Foci of arterial vascular calcification noted. Foci of paranasal sinus disease noted at several sites. Critical Value/emergent results were called by telephone at the time of interpretation on 06/18/2019 at 12:09 pm to provider [facility] , who verbally acknowledged these results. Electronically Signed   By: Lowella Grip III M.D.   On: 06/18/2019 12:10    MR BRAIN WO CONTRAST   Result Date: 06/18/2019 CLINICAL DATA:  Intracranial hemorrhage. Mild cognitive decline last 1-2 years. Headaches. EXAM: MRI HEAD WITHOUT CONTRAST TECHNIQUE: Multiplanar, multiecho pulse sequences of the brain and surrounding structures were obtained without intravenous contrast. COMPARISON:  CT head 06/18/2019.  MRI head 03/16/2018 FINDINGS: Brain: Hematoma right posterior temporoparietal lobe measuring approximately 2.5 x 2.9 cm. There is methemoglobin within the hematoma suggesting this may have been present for several days. There is surrounding edema. No underlying mass identified however intravenous contrast was not administered. Hemorrhage extends into the subdural space with a small right-sided subdural hematoma measuring approximately 3 mm in thickness. No  significant midline shift. No hemorrhage was seen in this area on the prior MRI. There are a few scattered areas of chronic microhemorrhage including the left  frontal lobe and left occipital parietal lobe. These may be related to the patient's known severe hypertension. Negative for acute ischemic infarct. Moderate chronic microvascular ischemic change in the white matter. Ventricle size and cerebral volume normal. Vascular: Normal arterial flow voids. Skull and upper cervical spine: Negative Sinuses/Orbits: Mucosal edema paranasal sinuses. Bilateral cataract extraction Other: None IMPRESSION: Hematoma right posterior temporoparietal lobe. This contains methemoglobin suggesting it may have been present for several days. There is extension into the subdural space with a small right-sided subdural hematoma. No midline shift. Negative for acute infarct. Moderate chronic microvascular ischemic change Small areas of chronic microhemorrhage left frontal lobe and left parietal lobe. Findings suggest hypertensive bleed. Electronically Signed   By: Franchot Gallo M.D.   On: 06/18/2019 16:36    Chest Port 1 View   Result Date: 06/18/2019 CLINICAL DATA:  Intracerebral hemorrhage. EXAM: PORTABLE CHEST 1 VIEW COMPARISON:  06/06/2019 FINDINGS: Heart size is within normal limits. Low lung volumes are noted, however both lungs are clear. Cervical spine fusion hardware again noted. IMPRESSION: Low lung volumes. No active disease. Electronically Signed   By: Marlaine Hind M.D.   On: 06/18/2019 14:33    ECHOCARDIOGRAM COMPLETE   Result Date: 06/19/2019    ECHOCARDIOGRAM REPORT   Patient Name:   SKYLYNNE SCHLECHTER Date of Exam: 06/19/2019 Medical Rec #:  [id number]     Height:       60.0 in Accession #:    [id number]    Weight:       147.9 lb Date of Birth:  1933/04/09    BSA:          1.642 m Patient Age:    34 years      BP:           137/87 mmHg Patient Gender: F             HR:           90 bpm. Exam Location:  Inpatient  Procedure: 2D Echo Indications:    stroke 434.91  History:        Patient has prior history of Echocardiogram examinations, most                 recent 03/19/2018. CAD, chronic kidney disease.; Risk                 Factors:Diabetes and Dyslipidemia.  Sonographer:    [name] Referring Phys: [place]  1. Left ventricular ejection fraction, by estimation, is 60 to 65%. The left ventricle has normal function. Left ventricular endocardial border not optimally defined to evaluate regional wall motion. Left ventricular diastolic parameters are consistent with Grade I diastolic dysfunction (impaired relaxation).  2. Right ventricular systolic function is normal. The right ventricular size is normal. Tricuspid regurgitation signal is inadequate for assessing PA pressure.  3. The mitral valve is grossly normal. No evidence of mitral valve regurgitation. No evidence of mitral stenosis.  4. The aortic valve is abnormal. Aortic valve regurgitation is trivial. Mild to moderate aortic valve sclerosis/calcification is present, without any evidence of aortic stenosis.  5. The inferior vena cava is normal in size with greater than 50% respiratory variability, suggesting right atrial pressure of 3 mmHg. Conclusion(s)/Recommendation(s): No intracardiac source of embolism detected on this transthoracic study. A transesophageal echocardiogram is recommended to exclude cardiac source of embolism if clinically indicated. FINDINGS  Left Ventricle: Left ventricular ejection fraction, by estimation, is 60 to 65%. The left ventricle has  normal function. Left ventricular endocardial border not optimally defined to evaluate regional wall motion. The left ventricular internal cavity size was normal in size. There is no left ventricular hypertrophy. Left ventricular diastolic parameters are consistent with Grade I diastolic dysfunction (impaired relaxation). Right Ventricle: The right ventricular size is normal. No  increase in right ventricular wall thickness. Right ventricular systolic function is normal. Tricuspid regurgitation signal is inadequate for assessing PA pressure. Left Atrium: Left atrial size was normal in size. Right Atrium: Right atrial size was normal in size. Pericardium: There is no evidence of pericardial effusion. Presence of pericardial fat pad. Mitral Valve: The mitral valve is grossly normal. Normal mobility of the mitral valve leaflets. No evidence of mitral valve regurgitation. No evidence of mitral valve stenosis. Tricuspid Valve: The tricuspid valve is normal in structure. Tricuspid valve regurgitation is trivial. No evidence of tricuspid stenosis. Aortic Valve: The aortic valve is abnormal. Aortic valve regurgitation is trivial. Mild to moderate aortic valve sclerosis/calcification is present, without any evidence of aortic stenosis. There is moderate calcification of the aortic valve. Aortic valve mean gradient measures 7.0 mmHg. Pulmonic Valve: The pulmonic valve was grossly normal. Pulmonic valve regurgitation is trivial. No evidence of pulmonic stenosis. Aorta: The aortic root is normal in size and structure. Venous: The inferior vena cava is normal in size with greater than 50% respiratory variability, suggesting right atrial pressure of 3 mmHg. IAS/Shunts: The interatrial septum was not well visualized.  LEFT VENTRICLE PLAX 2D LVIDd:         4.10 cm  Diastology LVIDs:         3.00 cm  LV e' lateral:   9.03 cm/s LV PW:         1.10 cm  LV E/e' lateral: 12.6 LV IVS:        1.10 cm  LV e' medial:    6.42 cm/s LVOT diam:     1.50 cm  LV E/e' medial:  17.8 LV SV:         55 LV SV Index:   33 LVOT Area:     1.77 cm  RIGHT VENTRICLE             IVC RV S prime:     21.40 cm/s  IVC diam: 2.10 cm LEFT ATRIUM             Index       RIGHT ATRIUM           Index LA diam:        4.00 cm 2.44 cm/m  RA Area:     11.40 cm LA Vol (A2C):   43.2 ml 26.31 ml/m RA Volume:   23.50 ml  14.31 ml/m LA Vol (A4C):    40.3 ml 24.54 ml/m LA Biplane Vol: 43.1 ml 26.25 ml/m  AORTIC VALVE AV Mean Grad: 7.0 mmHg LVOT Vmax:    134.00 cm/s LVOT Vmean:   92.800 cm/s LVOT VTI:     0.310 m  AORTA Ao Asc diam: 3.00 cm MITRAL VALVE MV Area (PHT): 3.27 cm     SHUNTS MV Decel Time: 232 msec     Systemic VTI:  0.31 m MV E velocity: 114.00 cm/s  Systemic Diam: 1.50 cm MV A velocity: 122.00 cm/s MV E/A ratio:  0.93 Cherlynn Kaiser MD Electronically signed by Cherlynn Kaiser MD Signature Date/Time: 06/19/2019/4:03:15 PM    Final          Assessment/Plan: Diagnosis: R ICH/hypertensive bleeding with L neglect,  mild hemiparesis and visual deficits 1. Does the need for close, 24 hr/day medical supervision in concert with the patient's rehab needs make it unreasonable for this patient to be served in a less intensive setting? Potentially 2. Co-Morbidities requiring supervision/potential complications: HTN, MI, CKD stage III, DM, mild cognitive deficits, depression, previous CVA 3. Due to bowel management, safety, skin/wound care, disease management, medication administration and patient education, does the patient require 24 hr/day rehab nursing? Yes 4. Does the patient require coordinated care of a physician, rehab nurse, therapy disciplines of PT, OT, SLP to address physical and functional deficits in the context of the above medical diagnosis(es)? Yes Addressing deficits in the following areas: balance, endurance, locomotion, strength, transferring, bathing, dressing, feeding, grooming, toileting, cognition and swallowing 5. Can the patient actively participate in an intensive therapy program of at least 3 hrs of therapy per day at least 5 days per week? Yes 6. The potential for patient to make measurable gains while on inpatient rehab is good and fair 7. Anticipated functional outcomes upon discharge from inpatient rehab are supervision and min assist  with PT, modified independent, supervision and min assist with OT, modified  independent and supervision with SLP. 8. Estimated rehab length of stay to reach the above functional goals is: 2-2.5 weeks 9. Anticipated discharge destination: Home 10. Overall Rehab/Functional Prognosis: good and fair   RECOMMENDATIONS: This patient's condition is appropriate for continued rehabilitative care in the following setting: CIR Patient has agreed to participate in recommended program. Potentially Note that insurance prior authorization may be required for reimbursement for recommended care.   Comment:  1. Pt has sustained a R ICH/hypertensive bleed with L hemiparesis, visual field deficits and apraxia/L neglect 2. Needs reminders to use L side 3. A1c is 8.6- primary team addressing 4. Will submit for insurance approval for CIR- although pt is 86 yr old, she was driving and independent prior to stroke, per chart.  5. Thank you for this consult- will follow remotely for admission.         Bary Leriche, PA-C 06/20/2019      I have personally performed a face to face diagnostic evaluation of this patient and formulated the key components of the plan.  Additionally, I have personally reviewed laboratory data, imaging studies, as well as relevant notes and concur with the physician assistant's documentation above.              Revision History                     Routing History

## 2019-06-22 NOTE — Progress Notes (Signed)
Patient arrived to unit via transport chair. Skin intact. Oriented to room and staff. Patient resting in bed.

## 2019-06-22 NOTE — Progress Notes (Signed)
[name], RN  Rehab Admission Coordinator  Physical Medicine and Rehabilitation  PMR Pre-admission      Signed  Date of Service:  06/21/2019  9:34 AM      Related encounter: ED to Hosp-Admission (Current) from 06/18/2019 in [facility]      Signed        Show:Clear all [x] Manual[x] Template[x] Copied  Added by: [x] [name], RN  [] Hover for details PMR Admission Coordinator Pre-Admission Assessment   Patient: Sandra [name] is an 86 y.o., female MRN: [id number] DOB: 1933-12-25 Height:   5' Weight:   148 lbs                                                                                                                                                  Insurance Information   PRIMARY: [employer]      Policy#: [id number]      Subscriber: pt CM Name: [name]      Phone#: [phone] ext [phone]     Fax#: [phone] Pre-Cert#: [id number] approved for 7 days with f/u with Jeanette Caprice ext [phone]      Employer:  Benefits:  Phone #: [phone]     Name:  Eff. Date: 01/20/2019     Deduct: none      Out of Pocket Max: $3900  CIR: $295 co pay per day days 1 until 6      SNF: no copay days 1 until 20; $184 co pay per day days 21 until 100 Outpatient: $10 to 40 per visit     Co-Pay: visits per medical neccesity Home Health: 100%    Co-Pay: visits per medical neccesity DME: 80%     Co-Pay: 205 Providers: in network  SECONDARY: none   Financial Counselor:       Phone#:    The Therapist, art Information Summary" for patients in Inpatient Rehabilitation Facilities with attached "Privacy Act [place] Records" was provided and verbally reviewed with: Patient and Family   Emergency Contact Information         Contact Information     Name Relation Home Work [name] Daughter [phone]        Garrel Ridgel     [phone]       Current Medical History  Patient Admitting Diagnosis: ICH   History of Present  Illness: Sandra [name] is a 86 y.o. RH- female with history of prior hemorrhagic stroke and TIA, T2DM with retinopathy, CKD (Dr. Ambrose Pancoast), interstitial lung disease, chronic LBP, fall off her porch 06/06/19 with mild injuries. She had recurrent fall on 05/29 am with reports of HA and right elbow pain-- presented to ED that evening but left due to wait time. She was admitted on 06/18/19 am with reports of confusion, HA, dizziness and difficulty walking. She was found to have left hemianopsia with  mild neglect and CT head done revealing focal hemorrhage in mid occipital lobe likely from hemorrhagic infarct and mild cytotoxic edema with small vessel disease.   MRI brain done showing hematoma in right posterior temporoparietal lobe containing methemoglobin and extension into subdural space with small SDH and evidence of prior micro hemorrhages in left frontal and left parietal lobe likely due to hypertensive bleed.    Neurology questioned hypertension v/s trauma due to multiple falls v/s amyloid as cause of bleed.  MRA ordered for work up and ASA d/c due to bleed. On cleviprex with SBP goal < 140.  2 D echo done revealing EF 60-65% with grade 1 diastolic dysfunction and mild-moderate aortic valve sclerosis.  Therapy evaluations completed revealing decreased awareness of deficits, left hemianopsia  with left inattention as well as gait disorder with fear of falling.   Noted temp 6/2 101.2  But now afebrile with WBC 8.0 Urine culture pending.    Complete NIHSS TOTAL: 2 Glasgow Coma Scale Score: 15   Past Medical History      Past Medical History:  Diagnosis Date  . Abnormal blood finding 02/10/2016  . Abrasion of ear canal 09/28/2012  . Achilles tendon injury 11/27/2010  . ACID REFLUX DISEASE 05/12/2006    Qualifier: Diagnosis of  By: Larose Kells MD, [place] Acute MI ([place])    . ALLERGIC RHINITIS 05/12/2006    Qualifier: Diagnosis of  By: Larose Kells MD, [place] Anxiety state 07/03/2009    Qualifier: Diagnosis of  By:  Larose Kells MD, [place] pain 07/01/2010  . Chronic kidney disease (CKD), stage III (moderate) ([place]) 05/31/2014  . Chronic right SI joint pain 09/22/2013  . DEGENERATIVE JOINT DISEASE, CERVICAL SPINE 06/23/2006    Annotation: had a CAT scan with a cervical myelogram that show  left C6-7 and  C5-6 foraminal stenosis Qualifier: Diagnosis of  By: Larose Kells MD, [place] DEPRESSION 05/12/2006    Qualifier: Diagnosis of  By: Larose Kells MD, [place] DIABETES MELLITUS, TYPE II 05/12/2006    Now following w/ Endo at [facility] 04/27/2007    Qualifier: Diagnosis of  By: Jerold Coombe    . Dyspnea 02/10/2016  . Encephalopathy, hypertensive    . Essential hypertension 05/12/2006    Qualifier: Diagnosis of  By: Larose Kells MD, [place] Fatigue 05/31/2014  . GAIT DISTURBANCE 08/07/2009    Qualifier: Diagnosis of  By: Larose Kells MD, [place] GERD (gastroesophageal reflux disease)    . Headache(784.0) 11/27/2010  . Hyperlipidemia    . Hyperlipidemia associated with type 2 diabetes mellitus ([place]) 05/12/2006    Qualifier: Diagnosis of  By: Larose Kells MD, [place] ILD (interstitial lung disease) ([place]) 05/31/2015  . Leukocytopenia 05/31/2014  . Lower abdominal pain 05/17/2014  . Nausea with vomiting 09/22/2013  . NECK PAIN, CHRONIC 04/03/2008    Qualifier: Diagnosis of  By: Larose Kells MD, [place] Nodule on liver 12/06/2015  . Osteoarthritis 02/10/2016  . Osteopenia    . Osteoporosis 06/23/2006    Annotation: had a bone density test in 08-2004.  T score was -2.4 Qualifier: Diagnosis of  By: Larose Kells MD, [place] SAH (subarachnoid hemorrhage) ([place])    . Sciatica    . Scleroderma ([place]) 02/28/2016  . Severe hypertension 03/18/2018  . Slurred speech 11/27/2010  . Takotsubo cardiomyopathy  s/p stress MI with stress induced CM with normal coronary arteries by cath 2007 with normalization of LVF by echo 04/2005  . TIA (transient ischemic attack)    . Unspecified cerebral artery occlusion with cerebral infarction 04/07/2012    . UTI (urinary tract infection) 11/02/2011  . 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 Vitamin D deficiency 10/01/2014      Family History  family history includes Asthma in her sister; Breast cancer in an other family member; Cancer in an other family member; Diabetes in her sister and sister; Intracerebral hemorrhage in her daughter; Stroke in her mother.   Prior Rehab/Hospitalizations:  Has the patient had prior rehab or hospitalizations prior to admission? Yes   Has the patient had major surgery during 100 days prior to admission? No   Current Medications    Current Facility-Administered Medications:  .  0.9 %  sodium chloride infusion, , Intravenous, Continuous, Rosalin Hawking, MD, Last Rate: 50 mL/hr at 06/22/19 0400, Rate Verify at 06/22/19 0400 .  acetaminophen (TYLENOL) tablet 650 mg, 650 mg, Oral, Q4H PRN, 650 mg at 06/22/19 0423 **OR** acetaminophen (TYLENOL) 160 MG/5ML solution 650 mg, 650 mg, Per Tube, Q4H PRN **OR** acetaminophen (TYLENOL) suppository 650 mg, 650 mg, Rectal, Q4H PRN, Metzger-Cihelka, Desiree, NP .  Chlorhexidine Gluconate Cloth 2 % PADS 6 each, 6 each, Topical, Daily, [place], Lanice Schwab, MD, 6 each at 06/22/19 1044 .  clonazePAM (KLONOPIN) disintegrating tablet 0.5 mg, 0.5 mg, Oral, QHS, Rosalin Hawking, MD, 0.5 mg at 06/21/19 2215 .  insulin aspart (novoLOG) injection 0-15 Units, 0-15 Units, Subcutaneous, TID WC, Greta Doom, MD, 3 Units at 06/22/19 0631 .  insulin detemir (LEVEMIR) injection 10 Units, 10 Units, Subcutaneous, BID, Rosalin Hawking, MD, 10 Units at 06/22/19 1043 .  labetalol (NORMODYNE) injection 5-20 mg, 5-20 mg, Intravenous, Q2H PRN, Rosalin Hawking, MD, 20 mg at 06/20/19 2314 .  lisinopril (ZESTRIL) tablet 20 mg, 20 mg, Oral, Daily, Rosalin Hawking, MD, 20 mg at 06/22/19 1044 .  metoprolol succinate (TOPROL-XL) 24 hr tablet 50 mg, 50 mg, Oral, Daily, Sarina Ill B, MD, 50 mg at 06/22/19 1044 .  ondansetron (ZOFRAN) tablet 4 mg, 4 mg, Oral, Q6H PRN **OR**  ondansetron (ZOFRAN) injection 4 mg, 4 mg, Intravenous, Q6H PRN, Melvenia Beam, MD, 4 mg at 06/19/19 1238 .  pantoprazole (PROTONIX) EC tablet 40 mg, 40 mg, Oral, QHS, Sarina Ill B, MD, 40 mg at 06/21/19 2214 .  QUEtiapine (SEROQUEL) tablet 25 mg, 25 mg, Oral, QHS, Rosalin Hawking, MD, 25 mg at 06/21/19 1733 .  senna-docusate (Senokot-S) tablet 1 tablet, 1 tablet, Oral, BID, Metzger-Cihelka, Desiree, NP, 1 tablet at 06/22/19 1044 .  sertraline (ZOLOFT) tablet 50 mg, 50 mg, Oral, Daily, Sarina Ill B, MD, 50 mg at 06/22/19 1044   Patients Current Diet:     Diet Order                      Diet heart healthy/carb modified Room service appropriate? Yes with Assist; Fluid consistency: Thin  Diet effective now                   Precautions / Restrictions Precautions Precautions: Fall Precaution Comments: L hemianopsia, L inattention, apraxia Restrictions Weight Bearing Restrictions: No    Has the patient had 2 or more falls or a fall with injury in the past year?Yes   Prior Activity Level Community (5-7x/wk): Independent and driving   Prior Functional Level Prior Function Level of  Independence: Independent Gait / Transfers Assistance Needed: walks no devices, fell backwards last Saturday coming in the door, fell PTA ADL's / Homemaking Assistance Needed: uses shower chair; helps with chores, likes to water her flowers; states she did her finances, shopping etc, but per chart needed assistance Comments: drives   Self Care: Did the patient need help bathing, dressing, using the toilet or eating?  Independent   Indoor Mobility: Did the patient need assistance with walking from room to room (with or without device)? Independent   Stairs: Did the patient need assistance with internal or external stairs (with or without device)? Independent   Functional Cognition: Did the patient need help planning regular tasks such as shopping or remembering to take medications? Independent     Home Assistive Devices / Equipment Home Assistive Devices/Equipment: Eyeglasses, Civil engineer, contracting with back, Radio producer (specify quad or straight) Home Equipment: Walker - 2 wheels, Cane - single point, Shower seat, Hand held shower head   Prior Device Use: Indicate devices/aids used by the patient prior to current illness, exacerbation or injury? Walker   Current Functional Level Cognition   Arousal/Alertness: Awake/alert Overall Cognitive Status: Impaired/Different from baseline Current Attention Level: Sustained Orientation Level: Oriented X4 Following Commands: Follows one step commands consistently, Follows multi-step commands with increased time Safety/Judgement: Decreased awareness of safety, Decreased awareness of deficits General Comments: needs multimodal cues to find objects and room number to L of midline Attention: Focused, Sustained Focused Attention: Appears intact Sustained Attention: Appears intact Memory: Impaired Memory Impairment: Decreased long term memory Decreased Long Term Memory: Verbal basic Awareness: Impaired Awareness Impairment: Intellectual impairment, Emergent impairment Problem Solving: Impaired Problem Solving Impairment: Functional basic    Extremity Assessment (includes Sensation/Coordination)   Upper Extremity Assessment: LUE deficits/detail LUE Deficits / Details: Lt UE [place] end stage IV.  Pt with decreased awareness of Lt UE reqiring mod cues to maintain Lt UE on walker  LUE Sensation: decreased proprioception LUE Coordination: decreased fine motor, decreased gross motor  Lower Extremity Assessment: Defer to PT evaluation LLE Deficits / Details: lifts antigravity, functional weakness/coordination noted with ambulation LLE Coordination: decreased gross motor     ADLs   Overall ADL's : Needs assistance/impaired Eating/Feeding: Supervision/ safety, Sitting(mod cues ) Eating/Feeding Details (indicate cue type and reason): assist for set up and  cues to locate food items  Grooming: Wash/dry hands, Wash/dry face, Oral care, Brushing hair, Moderate assistance, Standing Upper Body Bathing: Moderate assistance, Sitting Lower Body Bathing: Maximal assistance, Sit to/from stand Upper Body Dressing : Moderate assistance, Sitting Lower Body Dressing: Total assistance, Sit to/from stand Lower Body Dressing Details (indicate cue type and reason): Pt attempted to don socks, but unable to access feet, and frequently dropped sock when holding it in Lt UE  Toilet Transfer: Moderate assistance, +2 for physical assistance, +2 for safety/equipment, Ambulation, Comfort height toilet, RW Toileting- Clothing Manipulation and Hygiene: Maximal assistance, Sit to/from stand Functional mobility during ADLs: Moderate assistance, +2 for physical assistance, +2 for safety/equipment, Rolling walker     Mobility   Overal bed mobility: Needs Assistance Bed Mobility: Rolling, Sidelying to Sit Rolling: Supervision Sidelying to sit: Min assist Supine to sit: HOB elevated, Min assist General bed mobility comments: up to R side of bed with increased time, pt struggling so cues given and A to lift trunk     Transfers   Overall transfer level: Needs assistance Equipment used: Rolling walker (2 wheeled) Transfers: Sit to/from Stand Sit to Stand: Min assist Stand pivot transfers: Mod assist, +  2 physical assistance, +2 safety/equipment General transfer comment: assist to stand pt pulling up on RW bed higher than her feet can reach the ground     Ambulation / Gait / Stairs / Wheelchair Mobility   Ambulation/Gait Ambulation/Gait assistance: Mod assist Gait Distance (Feet): 100 Feet(x 2) Assistive device: Rolling walker (2 wheeled) Gait Pattern/deviations: Step-through pattern, Decreased stride length, Drifts right/left General Gait Details: cues for eyes on visual target for forward gaze and straight path in hallway assist for balance and to keep walker moving  straight as pt veers to R consistently when not assisted; assist for safety on turns with walker management and cues for obtaining new visual target     Posture / Balance Dynamic Sitting Balance Sitting balance - Comments: able to maintain static sitting with close min guard assist  Balance Overall balance assessment: Needs assistance Sitting-balance support: Feet unsupported, Feet supported Sitting balance-Leahy Scale: Fair Sitting balance - Comments: able to maintain static sitting with close min guard assist  Standing balance support: Bilateral upper extremity supported Standing balance-Leahy Scale: Poor Standing balance comment: L hand falls off walker frequently needing cues and assist to replace as LOB at times when hand falls off     Special needs/care consideration   Designated visitors are [name] and Gamaliel: (Lives with daughter and adult granddaughter)  Lives With: (daughter and adult granddaughter) Available Help at Discharge: Family, Friend(s), Available 24 hours/day(family works day shift; has friends to come stay when they a) Type of Home: House Home Layout: One level Home Access: Stairs to enter Entrance Stairs-Rails: None Technical brewer of Steps: 2 Bathroom Shower/Tub: Chiropodist: Standard Bathroom Accessibility: Yes How Accessible: Accessible via walker [place]: No   Discharge Living Setting Plans for Discharge Living Setting: Patient's home, Lives with (comment)(daughter and adult granddaughter) Type of Home at Discharge: House Discharge Home Layout: One level Discharge Home Access: Stairs to enter Entrance Stairs-Rails: None Entrance Stairs-Number of Steps: 2 Discharge Bathroom Shower/Tub: Tub/shower unit Discharge Bathroom Toilet: Standard Discharge Bathroom Accessibility: Yes How Accessible: Accessible via walker Does the patient have any problems obtaining your  medications?: No   Social/Family/Support Systems Patient Roles: Parent Contact Information: daughter, [name] Anticipated Caregiver: daughter, family and friends Anticipated Caregiver's Contact Information: see above Ability/Limitations of Caregiver: daughter works 6 pm until 6 am 2 to 3 days per week. Has family friend who can asisst when family works Building control surveyor Availability: 24/7 Discharge Plan Discussed with Primary Caregiver: Yes Is Caregiver In Agreement with Plan?: Yes Does Caregiver/Family have Issues with Lodging/Transportation while Pt is in Rehab?: No   Goals Patient/Family Goal for Rehab: supervision to min assist with PT, OT, and SLP Expected length of stay: ELOS 2 to 2 .5 weeks Pt/Family Agrees to Admission and willing to participate: Yes Program Orientation Provided & Reviewed with Pt/Caregiver Including Roles  & Responsibilities: Yes   Decrease burden of Care through IP rehab admission: n/a   Possible need for SNF placement upon discharge: not anticipated   Patient Condition: This patient's condition remains as documented in the consult dated 06/20/2019, in which the Rehabilitation Physician determined and documented that the patient's condition is appropriate for intensive rehabilitative care in an inpatient rehabilitation facility. Will admit to inpatient rehab today.   Preadmission Screen Completed By:  Cleatrice Burke, RN, 06/22/2019 12:35 PM ______________________________________________________________________   Discussed status with Dr. Naaman Plummer on 06/22/2019 at  1235 and received approval for admission today.  Admission Coordinator:  Cleatrice Burke, time 9136 Date 06/22/2019             Cosigned by: Meredith Staggers, MD at 06/22/2019 12:57 PM  Revision History

## 2019-06-22 NOTE — IPOC Note (Signed)
Individualized overall Plan of Care [facility]) Patient Details Name: Sandra [name] MRN: [id number] DOB: 1933-09-23  Admitting Diagnosis: Lobar cerebral hemorrhage [facility])  Hospital Problems: Principal Problem:   Lobar cerebral hemorrhage ([place]) Active Problems:   Hypoalbuminemia due to protein-calorie malnutrition (HCC)   Macrocytosis   Acute blood loss anemia   FUO (fever of unknown origin)   Diabetic retinopathy of left eye associated with type 2 diabetes mellitus ([place])     Functional Problem List: Nursing Pain, Bladder, Bowel, Medication Management  PT Balance, Endurance, Motor, Pain, Perception, Safety  OT Balance, Cognition, Endurance, Motor, Vision, Sensory, Perception, Pain, Safety  SLP Cognition  TR         Basic ADLs: OT Bathing, Dressing, Toileting     Advanced  ADLs: OT       Transfers: PT Bed Mobility, Bed to Chair, Car, Manufacturing systems engineer, Metallurgist: PT Ambulation, Emergency planning/management officer, Stairs     Additional Impairments: OT None  SLP Social Cognition   Problem Solving, Memory, Attention, Awareness  TR      Anticipated Outcomes Item Anticipated Outcome  Self Feeding Independent  Swallowing      Basic self-care  supervision  Toileting  Supervision   Bathroom Transfers Supervision to toilet, CGA to shower  Bowel/Bladder  Pt will be continent x 2 and be able verbalize when she feels she has to go with min assist  Transfers  MinA, LRAD  Locomotion  MinA, LRAD  Communication     Cognition  Supervision-Mod I  Pain  pt will report pain of less than 3 out of 10 with min assist  Safety/Judgment      Therapy Plan: PT Intensity: Minimum of 1-2 x/day ,45 to 90 minutes PT Frequency: 5 out of 7 days PT Duration Estimated Length of Stay: 14-18 days OT Intensity: Minimum of 1-2 x/day, 45 to 90 minutes OT Frequency: 5 out of 7 days OT Duration/Estimated Length of Stay: 14-18 days SLP Intensity: Minumum of 1-2 x/day, 30 to 90  minutes SLP Frequency: 3 to 5 out of 7 days SLP Duration/Estimated Length of Stay: 14-18 days    Team Interventions: Nursing Interventions Patient/Family Education, Disease Management/Prevention, Discharge Planning, Bladder Management, Pain Management, Bowel Management  PT interventions Ambulation/gait training, Discharge planning, Functional mobility training, Psychosocial support, Therapeutic Activities, Visual/perceptual remediation/compensation, Balance/vestibular training, Disease management/prevention, Neuromuscular re-education, Skin care/wound management, Therapeutic Exercise, Wheelchair propulsion/positioning, Cognitive remediation/compensation, DME/adaptive equipment instruction, Pain management, Splinting/orthotics, UE/LE Strength taining/ROM, Community reintegration, Technical sales engineer stimulation, Barrister's clerk education, IT trainer, UE/LE Coordination activities  OT Interventions Training and development officer, Cognitive remediation/compensation, Discharge planning, DME/adaptive equipment instruction, Functional mobility training, Neuromuscular re-education, Pain management, Psychosocial support, Patient/family education, Self Care/advanced ADL retraining, Therapeutic Activities, Therapeutic Exercise, UE/LE Strength taining/ROM, UE/LE Coordination activities, Visual/perceptual remediation/compensation  SLP Interventions Cognitive remediation/compensation, Internal/external aids, Therapeutic Activities, Environmental controls, Cueing hierarchy, Functional tasks, Patient/family education  TR Interventions    SW/CM Interventions Discharge Planning, Psychosocial Support, Patient/Family Education   Barriers to Discharge MD  Medical stability and Memory  Nursing Incontinence    PT Home environment access/layout 2 STE no rails  OT      SLP Decreased caregiver support, Lack of/limited family support    SW       Team Discharge Planning: Destination: PT-Home ,OT- Home ,  SLP-Home Projected Follow-up: PT-Home health PT, 24 hour supervision/assistance, OT-  Outpatient OT, SLP-Home Health SLP, 24 hour supervision/assistance Projected Equipment Needs: PT-To be determined, OT-  , SLP-None recommended by SLP Equipment Details:  PT- , OT-  Patient/family involved in discharge planning: PT- Patient,  OT-Patient, SLP-Patient  MD ELOS: 12-16 days. Medical Rehab Prognosis:  Good Assessment: Right-handed female with history of prior hemorrhagic stroke/TIA, T2DM with retinopathy, CKD (Dr. Blanche East), ILD (Dr. Vaughan Browner), chronic LBP, fall 06/06/19 with mild injuries and recurrent fall on 06/17/19 with reports of persistent HA and right elbow pain. She presented to the ED that evening but left due to wait time.  She was admitted on 06/18/2019 with reports of confusion, headaches, dizziness and difficulty walking.  She was found to have left hemianopsia with mild neglect and CT of head done revealing focal hemorrhage in middle occipital lobe likely from hemorrhagic infarct and mild cytotoxic edema with small vessel disease.  MRI brain done revealing hematoma right posterior temporoparietal lobe containing methemoglobin and extension into subdural speech with small SDH and evidence of prior microhemorrhages in left frontal and left parietal lobe likely due to hypertensive bleed.   Neurology question hypertension versus trauma versus amyloid as cause of bleed.  Aspirin was discontinued and Cleviprex added to keep SBP less than 140.  2D echo done revealing EF 60 to 65% with grade 1 diastolic dysfunction and mild to moderate aortic sclerosis.  MRA was negative for stenosis, aneurysm or vascular malformation.  Dr. Erlinda Hong felt that liver bleed without secondary cause and felt that she was a good candidate for SATURN trial.  She did develop fever 101.2 a nd UA unremarkable.  She continues to have limitations due to headaches as well as chronic back pain, balance deficits, right gaze preference with  left inattention and decreased awareness of deficits.  Will set goals for in [place] with PT and supervision with OT/SLP.     Due to the current state of emergency, patients may not be receiving their 3-hours of Medicare-mandated therapy.  See Team Conference Notes for weekly updates to the plan of care

## 2019-06-22 NOTE — Progress Notes (Signed)
Discharged to 4W room 5 and report was called to [facility].

## 2019-06-22 NOTE — Progress Notes (Addendum)
Inpatient Rehabilitation Admissions Coordinator  Noted temp early am. Discussed with Burnetta Sabin, [facility] and Dr. Naaman Plummer We can admit her to CIR today. I met with patient at bedside and she is in agreement,. I have left daughter, [name], voicemail and await her call back before proceeding.  Danne Baxter, RN, MSN Rehab Admissions Coordinator [phone] 06/22/2019 10:52 AM   I met with patient with her daughter at bedside. Patient more confused form yesterday, up with Nursing to bathroom. [name] agrees to [employer] admit today. I will make the arrangements.  Danne Baxter, RN, MSN Rehab Admissions Coordinator [phone] 06/22/2019 12:07 PM

## 2019-06-22 NOTE — H&P (Signed)
Physical Medicine and Rehabilitation Admission H&P    Chief Complaint  Patient presents with  . Functional deficits due to [place]    HPI:  Sandra [name] is an 86 year old right-handed female with history of prior hemorrhagic stroke/TIA, T2DM with retinopathy, CKD (Dr. Blanche East), ILD (Dr. Vaughan Browner), chronic LBP, fall 06/06/19 with mild injuries and recurrent fall on 06/17/19 with reports of persistent HA and right elbow pain. She presented to the ED that evening but left due to wait time.  She was admitted on 06/18/2019 with reports of confusion, headaches, dizziness and difficulty walking.  She was found to have left hemianopsia with mild neglect and CT of head done revealing focal hemorrhage in middle occipital lobe likely from hemorrhagic infarct and mild cytotoxic edema with small vessel disease.  MRI brain done revealing hematoma right posterior temporoparietal lobe containing methemoglobin and extension into subdural speech with small SDH and evidence of prior microhemorrhages in left frontal and left parietal lobe likely due to hypertensive bleed.  Neurology question hypertension versus trauma versus amyloid as cause of bleed.  Aspirin was discontinued and Cleviprex added to keep SBP less than 140.  2D echo done revealing EF 60 to 65% with grade 1 diastolic dysfunction and mild to moderate aortic sclerosis.  MRA was negative for stenosis, aneurysm or vascular malformation.  Dr. Erlinda Hong felt that liver bleed without secondary cause and felt that she was a good candidate for SATURN trial.  She did develop fever 101.2 early am and UA ordered for work up.  She continues to have limitations due to headaches as well as chronic back pain, balance deficits, right gaze preference with left inattention and decreased awareness of deficits.  CIR was recommended due to functional decline   Review of Systems  Constitutional: Negative for chills and fever.  HENT: Negative for hearing loss.   Eyes: Negative for  blurred vision and double vision.  Respiratory: Positive for shortness of breath (chronic).   Cardiovascular: Positive for chest pain (chest wall pain).  Genitourinary: Positive for frequency (chronic) and urgency.  Musculoskeletal: Positive for back pain.  Skin: Negative for rash.  Neurological: Positive for weakness and headaches.  Psychiatric/Behavioral: The patient is nervous/anxious.      Past Medical History:  Diagnosis Date  . Abnormal blood finding 02/10/2016  . Abrasion of ear canal 09/28/2012  . Achilles tendon injury 11/27/2010  . ACID REFLUX DISEASE 05/12/2006   Qualifier: Diagnosis of  By: Larose Kells MD, [place] Acute MI ([place])   . ALLERGIC RHINITIS 05/12/2006   Qualifier: Diagnosis of  By: Larose Kells MD, [place] Anxiety state 07/03/2009   Qualifier: Diagnosis of  By: Larose Kells MD, [place] pain 07/01/2010  . Chronic kidney disease (CKD), stage III (moderate) ([place]) 05/31/2014  . Chronic right SI joint pain 09/22/2013  . DEGENERATIVE JOINT DISEASE, CERVICAL SPINE 06/23/2006   Annotation: had a CAT scan with a cervical myelogram that show  left C6-7 and  C5-6 foraminal stenosis Qualifier: Diagnosis of  By: Larose Kells MD, [place] DEPRESSION 05/12/2006   Qualifier: Diagnosis of  By: Larose Kells MD, [place] DIABETES MELLITUS, TYPE II 05/12/2006   Now following w/ Endo at [facility] 04/27/2007   Qualifier: Diagnosis of  By: Jerold Coombe    . Dyspnea 02/10/2016  . Encephalopathy, hypertensive   . Essential hypertension 05/12/2006   Qualifier: Diagnosis of  By:  Paz MD, [place] Fatigue 05/31/2014  . GAIT DISTURBANCE 08/07/2009   Qualifier: Diagnosis of  By: Larose Kells MD, [place] GERD (gastroesophageal reflux disease)   . Headache(784.0) 11/27/2010  . Hyperlipidemia   . Hyperlipidemia associated with type 2 diabetes mellitus ([place]) 05/12/2006   Qualifier: Diagnosis of  By: Larose Kells MD, [place] ILD (interstitial lung disease) ([place]) 05/31/2015  . Leukocytopenia 05/31/2014  .  Lower abdominal pain 05/17/2014  . Nausea with vomiting 09/22/2013  . NECK PAIN, CHRONIC 04/03/2008   Qualifier: Diagnosis of  By: Larose Kells MD, [place] Nodule on liver 12/06/2015  . Osteoarthritis 02/10/2016  . Osteopenia   . Osteoporosis 06/23/2006   Annotation: had a bone density test in 08-2004.  T score was -2.4 Qualifier: Diagnosis of  By: Larose Kells MD, [place] SAH (subarachnoid hemorrhage) ([place])   . Sciatica   . Scleroderma ([place]) 02/28/2016  . Severe hypertension 03/18/2018  . Slurred speech 11/27/2010  . Takotsubo cardiomyopathy    s/p stress MI with stress induced CM with normal coronary arteries by cath 2007 with normalization of LVF by echo 04/2005  . TIA (transient ischemic attack)   . Unspecified cerebral artery occlusion with cerebral infarction 04/07/2012  . UTI (urinary tract infection) 11/02/2011  . 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 Vitamin D deficiency 10/01/2014    Past Surgical History:  Procedure Laterality Date  . ABDOMINAL HYSTERECTOMY  1977   no oophorectomy  . APPENDECTOMY    . CARDIAC CATHETERIZATION    . CATARACT EXTRACTION, BILATERAL  2011  . SPINAL FUSION  2000   Dr. Vertell Limber  . TONSILLECTOMY      Family History  Problem Relation Age of Onset  . Intracerebral hemorrhage Daughter        assoc with post-partum  . Stroke Mother   . Diabetes Sister   . Asthma Sister   . Diabetes Sister   . Breast cancer Other        ?Aunt  . Cancer Other        breast?  . Coronary artery disease Neg Hx     Social History: Widowed--multiple family members live with her. 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 She was independent without AD and still drives. She  reports that she is a non-smoker but has been exposed to tobacco smoke. She has never used smokeless tobacco. She reports current alcohol use of about 1.0 standard drinks of alcohol per week. She reports that she does not use drugs.   Allergies  Allergen Reactions  . Cephalexin Nausea And Vomiting    Pt stated  made severely sick, will never take again  . Nintedanib Diarrhea  . Pioglitazone Other (See Comments)    REACTION: EDEMA    Medications Prior to Admission  Medication Sig Dispense Refill  . aspirin 81 MG tablet Take 81 mg by mouth daily.    Marland Kitchen atorvastatin (LIPITOR) 40 MG tablet Take 1 tablet (40 mg total) by mouth daily at 6 PM. 30 tablet 0  . b complex vitamins tablet Take 1 tablet by mouth daily.    . Cholecalciferol (VITAMIN D) 125 MCG (5000 UT) CAPS Take 5,000 Units by mouth daily.    . clonazePAM (KLONOPIN) 0.5 MG tablet TAKE 1 TABLET TWICE DAILY AS NEEDED FOR ANXIETY (Patient taking differently: Take 0.5 mg by mouth 2 (two) times daily as needed for anxiety. ) 90 tablet 1  .  Cyanocobalamin (VITAMIN B-12 PO) Take 1 tablet by mouth daily.     . famotidine (PEPCID) 40 MG tablet Take 40 mg by mouth daily.     . fenofibrate 54 MG tablet Take 54 mg by mouth daily.    Marland Kitchen glimepiride (AMARYL) 4 MG tablet Take 2 mg by mouth 2 (two) times daily.   2  . Insulin NPH, Human,, Isophane, (HUMULIN N) 100 UNIT/ML Kiwkpen Inject into the skin. Take 36 units am and 6 units pm    . insulin regular (NOVOLIN R) 100 units/mL injection Inject 23-27 Units into the skin See admin instructions. 23 units before Breakfast and lunch, 27 units before dinner.    Marland Kitchen lisinopril (PRINIVIL,ZESTRIL) 10 MG tablet Take 10 mg by mouth daily.     . metoprolol succinate (TOPROL-XL) 50 MG 24 hr tablet TAKE 1 TABLET EVERY DAY.  TAKE  WITH  OR  IMMEDIATELY FOLLOWING A MEAL  (Patient taking differently: Take 50 mg by mouth daily. ) 90 tablet 1  . sertraline (ZOLOFT) 50 MG tablet Take 50 mg by mouth daily.    . Turmeric 500 MG CAPS Take 500 mg by mouth daily.    . vitamin E (VITAMIN E) 180 MG (400 UNITS) capsule Take 400 Units by mouth daily.    Marland Kitchen ACCU-CHEK FASTCLIX LANCETS MISC     . ACCU-CHEK SMARTVIEW test strip     . HYDROcodone-acetaminophen (NORCO/VICODIN) 5-325 MG tablet Take 1 tablet by mouth every 6 (six) hours as needed  for moderate pain. (Patient not taking: Reported on 06/18/2019) 45 tablet 0  . Insulin Syringe-Needle U-100 (INSULIN SYRINGE .5CC/31GX5/16") 31G X 5/16" 0.5 ML MISC 40 units in morning and 18units at night    . Nintedanib (OFEV) 100 MG CAPS Take 1 capsule (100 mg total) by mouth 2 (two) times daily after a meal. (Patient not taking: Reported on 04/24/2019) 60 capsule 5    Drug Regimen Review  Drug regimen was reviewed and remains appropriate with no significant issues identified  Home: Home Living Family/patient expects to be discharged to:: Private residence Living Arrangements: (Lives with daughter and adult granddaughter) Available Help at Discharge: Family, Friend(s), Available 24 hours/day(family works day shift; has friends to come stay when they a) Type of Home: House Home Access: Stairs to enter Technical brewer of Steps: 2 Entrance Stairs-Rails: None Home Layout: One level Bathroom Shower/Tub: Chiropodist: Standard Bathroom Accessibility: Yes Home Equipment: Environmental consultant - 2 wheels, Cane - single point, Shower seat, Hand held shower head  Lives With: (daughter and adult granddaughter)   Functional History: Prior Function Level of Independence: Independent Gait / Transfers Assistance Needed: walks no devices, fell backwards last Saturday coming in the door, fell PTA ADL's / Homemaking Assistance Needed: uses shower chair; helps with chores, likes to water her flowers; states she did her finances, shopping etc, but per chart needed assistance Comments: drives  Functional Status:  Mobility: Bed Mobility Overal bed mobility: Needs Assistance Bed Mobility: Rolling, Sidelying to Sit Rolling: Supervision Sidelying to sit: Min assist Supine to sit: HOB elevated, Min assist General bed mobility comments: up to R side of bed with increased time, pt struggling so cues given and A to lift trunk Transfers Overall transfer level: Needs assistance Equipment used:  Rolling walker (2 wheeled) Transfers: Sit to/from Stand Sit to Stand: Min assist Stand pivot transfers: Mod assist, +2 physical assistance, +2 safety/equipment General transfer comment: assist to stand pt pulling up on RW bed higher than her feet can  reach the ground Ambulation/Gait Ambulation/Gait assistance: Mod assist Gait Distance (Feet): 100 Feet(x 2) Assistive device: Rolling walker (2 wheeled) Gait Pattern/deviations: Step-through pattern, Decreased stride length, Drifts right/left General Gait Details: cues for eyes on visual target for forward gaze and straight path in hallway assist for balance and to keep walker moving straight as pt veers to R consistently when not assisted; assist for safety on turns with walker management and cues for obtaining new visual target    ADL: ADL Overall ADL's : Needs assistance/impaired Eating/Feeding: Supervision/ safety, Sitting(mod cues ) Eating/Feeding Details (indicate cue type and reason): assist for set up and cues to locate food items  Grooming: Wash/dry hands, Wash/dry face, Oral care, Brushing hair, Moderate assistance, Standing Upper Body Bathing: Moderate assistance, Sitting Lower Body Bathing: Maximal assistance, Sit to/from stand Upper Body Dressing : Moderate assistance, Sitting Lower Body Dressing: Total assistance, Sit to/from stand Lower Body Dressing Details (indicate cue type and reason): Pt attempted to don socks, but unable to access feet, and frequently dropped sock when holding it in Lt UE  Toilet Transfer: Moderate assistance, +2 for physical assistance, +2 for safety/equipment, Ambulation, Comfort height toilet, RW Toileting- Clothing Manipulation and Hygiene: Maximal assistance, Sit to/from stand Functional mobility during ADLs: Moderate assistance, +2 for physical assistance, +2 for safety/equipment, Rolling walker  Cognition: Cognition Overall Cognitive Status: Impaired/Different from baseline Arousal/Alertness:  Awake/alert Orientation Level: Oriented X4 Attention: Focused, Sustained Focused Attention: Appears intact Sustained Attention: Appears intact Memory: Impaired Memory Impairment: Decreased long term memory Decreased Long Term Memory: Verbal basic Awareness: Impaired Awareness Impairment: Intellectual impairment, Emergent impairment Problem Solving: Impaired Problem Solving Impairment: Functional basic Cognition Arousal/Alertness: Awake/alert Behavior During Therapy: WFL for tasks assessed/performed Overall Cognitive Status: Impaired/Different from baseline Area of Impairment: Attention, Memory, Following commands, Safety/judgement, Awareness, Problem solving Current Attention Level: Sustained Memory: Decreased short-term memory Following Commands: Follows one step commands consistently, Follows multi-step commands with increased time Safety/Judgement: Decreased awareness of safety, Decreased awareness of deficits Awareness: Intellectual Problem Solving: Slow processing, Difficulty sequencing, Requires verbal cues, Requires tactile cues General Comments: needs multimodal cues to find objects and room number to L of midline   Blood pressure (!) 155/43, pulse 65, temperature 98.4 F (36.9 C), temperature source Oral, resp. rate 18, SpO2 99 %. Physical Exam  Constitutional: No distress.  HENT:  Head: Normocephalic and atraumatic.  Eyes: Pupils are equal, round, and reactive to light. Conjunctivae are normal.  Cardiovascular: Normal rate. Exam reveals no friction rub.  No murmur heard. Respiratory: Effort normal. No respiratory distress. She has no wheezes.  GI: Soft. She exhibits no distension. There is no abdominal tenderness.  Musculoskeletal:        General: No edema. Normal range of motion.     Cervical back: Normal range of motion.  Neurological:  Pt alert and oriented to person, place, month/year, stroke. Follows basic commands. Normal language and speech. Decreased insight  and awareness. Left inattention and hemianopsia. Senses pain in all 4's. RUE and RLE 4+ to 5/5. LUE 4 to 4+/5. LLE 4/5. Decreased [place] on left, +/- PD on left. No resting tone  Skin: Skin is warm. She is not diaphoretic.  Psychiatric: She has a normal mood and affect. Her behavior is normal.    Results for orders placed or performed during the hospital encounter of 06/18/19 (from the past 48 hour(s))  Glucose, capillary     Status: Abnormal   Collection Time: 06/20/19 11:32 AM  Result Value Ref Range   Glucose-Capillary 178 (  H) 70 - 99 mg/dL    Comment: Glucose reference range applies only to samples taken after fasting for at least 8 hours.   Comment 1 Notify RN    Comment 2 Document in Chart   Glucose, capillary     Status: Abnormal   Collection Time: 06/20/19  6:05 PM  Result Value Ref Range   Glucose-Capillary 237 (H) 70 - 99 mg/dL    Comment: Glucose reference range applies only to samples taken after fasting for at least 8 hours.   Comment 1 Notify RN    Comment 2 Document in Chart   Glucose, capillary     Status: Abnormal   Collection Time: 06/20/19 10:17 PM  Result Value Ref Range   Glucose-Capillary 134 (H) 70 - 99 mg/dL    Comment: Glucose reference range applies only to samples taken after fasting for at least 8 hours.  CBC     Status: Abnormal   Collection Time: 06/21/19  6:49 AM  Result Value Ref Range   WBC 8.9 4.0 - 10.5 K/uL   RBC 3.15 (L) 3.87 - 5.11 MIL/uL   Hemoglobin 10.7 (L) 12.0 - 15.0 g/dL   HCT 31.7 (L) 36.0 - 46.0 %   MCV 100.6 (H) 80.0 - 100.0 fL   MCH 34.0 26.0 - 34.0 pg   MCHC 33.8 30.0 - 36.0 g/dL   RDW 11.6 11.5 - 15.5 %   Platelets 221 150 - 400 K/uL   nRBC 0.0 0.0 - 0.2 %    Comment: Performed at [facility], [place] [address], [place], [place] [address]  Basic metabolic panel     Status: Abnormal   Collection Time: 06/21/19  6:49 AM  Result Value Ref Range   Sodium 136 135 - 145 mmol/L   Potassium 4.2 3.5 - 5.1 mmol/L   Chloride 105 98 -  111 mmol/L   CO2 19 (L) 22 - 32 mmol/L   Glucose, Bld 198 (H) 70 - 99 mg/dL    Comment: Glucose reference range applies only to samples taken after fasting for at least 8 hours.   BUN 18 8 - 23 mg/dL   Creatinine, Ser 1.42 (H) 0.44 - 1.00 mg/dL   Calcium 8.6 (L) 8.9 - 10.3 mg/dL   GFR calc non Af Amer 34 (L) >60 mL/min   GFR calc Af Amer 39 (L) >60 mL/min   Anion gap 12 5 - 15    Comment: Performed at [facility] [address], [place], [place] [address]  Glucose, capillary     Status: Abnormal   Collection Time: 06/21/19  7:31 AM  Result Value Ref Range   Glucose-Capillary 208 (H) 70 - 99 mg/dL    Comment: Glucose reference range applies only to samples taken after fasting for at least 8 hours.   Comment 1 Notify RN    Comment 2 Document in Chart   Glucose, capillary     Status: Abnormal   Collection Time: 06/21/19 11:38 AM  Result Value Ref Range   Glucose-Capillary 213 (H) 70 - 99 mg/dL    Comment: Glucose reference range applies only to samples taken after fasting for at least 8 hours.  Glucose, capillary     Status: Abnormal   Collection Time: 06/21/19  4:48 PM  Result Value Ref Range   Glucose-Capillary 214 (H) 70 - 99 mg/dL    Comment: Glucose reference range applies only to samples taken after fasting for at least 8 hours.  Comment 1 Notify RN    Comment 2 Document in Chart   Glucose, capillary     Status: Abnormal   Collection Time: 06/21/19  9:10 PM  Result Value Ref Range   Glucose-Capillary 207 (H) 70 - 99 mg/dL    Comment: Glucose reference range applies only to samples taken after fasting for at least 8 hours.   Comment 1 Notify RN    Comment 2 Document in Chart   CBC     Status: Abnormal   Collection Time: 06/22/19  3:43 AM  Result Value Ref Range   WBC 8.0 4.0 - 10.5 K/uL   RBC 3.05 (L) 3.87 - 5.11 MIL/uL   Hemoglobin 10.1 (L) 12.0 - 15.0 g/dL   HCT 30.8 (L) 36.0 - 46.0 %   MCV 101.0 (H) 80.0 - 100.0 fL   MCH 33.1 26.0 - 34.0 pg   MCHC 32.8 30.0  - 36.0 g/dL   RDW 11.5 11.5 - 15.5 %   Platelets 201 150 - 400 K/uL   nRBC 0.0 0.0 - 0.2 %    Comment: Performed at [facility], [place] [address] [place], [place] [address]  Basic metabolic panel     Status: Abnormal   Collection Time: 06/22/19  3:43 AM  Result Value Ref Range   Sodium 134 (L) 135 - 145 mmol/L   Potassium 4.0 3.5 - 5.1 mmol/L   Chloride 106 98 - 111 mmol/L   CO2 19 (L) 22 - 32 mmol/L   Glucose, Bld 197 (H) 70 - 99 mg/dL    Comment: Glucose reference range applies only to samples taken after fasting for at least 8 hours.   BUN 17 8 - 23 mg/dL   Creatinine, Ser 1.30 (H) 0.44 - 1.00 mg/dL   Calcium 8.3 (L) 8.9 - 10.3 mg/dL   GFR calc non Af Amer 38 (L) >60 mL/min   GFR calc Af Amer 44 (L) >60 mL/min   Anion gap 9 5 - 15    Comment: Performed at [facility] [address], [place], [place] [address]  Glucose, capillary     Status: Abnormal   Collection Time: 06/22/19  6:05 AM  Result Value Ref Range   Glucose-Capillary 165 (H) 70 - 99 mg/dL    Comment: Glucose reference range applies only to samples taken after fasting for at least 8 hours.   Comment 1 Notify RN    Comment 2 Document in Chart   Glucose, capillary     Status: Abnormal   Collection Time: 06/22/19  7:27 AM  Result Value Ref Range   Glucose-Capillary 154 (H) 70 - 99 mg/dL    Comment: Glucose reference range applies only to samples taken after fasting for at least 8 hours.   MR ANGIO HEAD WO CONTRAST  Result Date: 06/20/2019 CLINICAL DATA:  Follow-up hemorrhagic stroke. Right temporoparietal insult. EXAM: MRA HEAD WITHOUT CONTRAST TECHNIQUE: Angiographic images of the Circle of Willis were obtained using MRA technique without intravenous contrast. COMPARISON:  MRI 06/18/2019.  CT 06/18/2019. FINDINGS: Both internal carotid arteries are widely patent through the skull base and siphon region. The anterior and middle cerebral vessels are patent without proximal stenosis, aneurysm or vascular  malformation. Large posterior communicating artery on the right. Both vertebral arteries are widely patent to the basilar. No basilar stenosis. Posterior circulation branch vessels are normal. No high flow vascular abnormality is seen in the region of the right temporoparietal hematoma. IMPRESSION: No evidence of aneurysm or  vascular malformation to explain the right temporoparietal intraparenchymal hemorrhage. Normal MR angiography of the large and medium sized intracranial vessels. Electronically Signed   By: Nelson Chimes M.D.   On: 06/20/2019 16:45   VAS US CAROTID  Result Date: 06/21/2019 Carotid Arterial Duplex Study Indications:       CVA. Limitations        Today's exam was limited due to the high bifurcation of the                    carotid and poor penetration. Comparison Study:  04/11/18 Performing Technologist: Antonieta Pert RDMS, RVT  Examination Guidelines: A complete evaluation includes B-mode imaging, spectral Doppler, color Doppler, and power Doppler as needed of all accessible portions of each vessel. Bilateral testing is considered an integral part of a complete examination. Limited examinations for reoccurring indications may be performed as noted.  Right Carotid Findings: +----------+--------+--------+--------+------------------+--------+           PSV cm/sEDV cm/sStenosisPlaque DescriptionComments +----------+--------+--------+--------+------------------+--------+ CCA Prox  96      16                                         +----------+--------+--------+--------+------------------+--------+ CCA Distal75      15                                         +----------+--------+--------+--------+------------------+--------+ ICA Prox  107     25                                         +----------+--------+--------+--------+------------------+--------+ ICA Mid   88      20                                          +----------+--------+--------+--------+------------------+--------+ ICA Distal56      23                                         +----------+--------+--------+--------+------------------+--------+ ECA       108     14                                         +----------+--------+--------+--------+------------------+--------+ +----------+--------+-------+--------+-------------------+           PSV cm/sEDV cmsDescribeArm Pressure (mmHG) +----------+--------+-------+--------+-------------------+ [id number]                                        +----------+--------+-------+--------+-------------------+ +---------+--------+--+--------+--+---------+ VertebralPSV cm/s82EDV cm/s21Antegrade +---------+--------+--+--------+--+---------+ difficult to evaluate plaque due to poor 2D images Left Carotid Findings: +----------+--------+--------+--------+------------------+--------+           PSV cm/sEDV cm/sStenosisPlaque DescriptionComments +----------+--------+--------+--------+------------------+--------+ CCA Prox  92      13                                         +----------+--------+--------+--------+------------------+--------+  CCA Distal79      14                                         +----------+--------+--------+--------+------------------+--------+ ICA Prox  70      17                                         +----------+--------+--------+--------+------------------+--------+ ICA Mid   85      21                                         +----------+--------+--------+--------+------------------+--------+ ICA Distal90      27                                         +----------+--------+--------+--------+------------------+--------+ ECA       120     22                                         +----------+--------+--------+--------+------------------+--------+ +----------+--------+--------+--------+-------------------+           PSV  cm/sEDV cm/sDescribeArm Pressure (mmHG) +----------+--------+--------+--------+-------------------+ [id number]                                         +----------+--------+--------+--------+-------------------+ +---------+--------+--+--------+--+---------+ VertebralPSV cm/s46EDV cm/s10Antegrade +---------+--------+--+--------+--+---------+ difficult to evaluate plaque due to poor 2D images  Summary: Right Carotid: Velocities in the right ICA are consistent with a 1-39% stenosis. Left Carotid: Velocities in the left ICA are consistent with a 1-39% stenosis. Vertebrals: Bilateral vertebral arteries demonstrate antegrade flow. *See table(s) above for measurements and observations.  Electronically signed by Antony Contras MD on 06/21/2019 at 8:28:26 AM.    Final        Medical Problem List and Plan: 1.  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 Pt with some cognitive decline prior to hospitalization  -patient may  shower  -ELOS/Goals: 14-18 days, supervision to min assist goals 2.  Antithrombotics: -DVT/anticoagulation:  Pharmaceutical: Lovenox added  -antiplatelet therapy: N/A due to bleed.  3.Chronic LBP/Headaches/ Pain Management: Tylenol prn 4. Mood: LCSW to follow for evaluation and support.   -antipsychotic agents: N/a 5. Neuropsych: This patient is not fully capable of making decisions on her own behalf. 6. Skin/Wound Care: Routine pressure relief measures.  7. Fluids/Electrolytes/Nutrition: Monitor I/O. Check lytes in am.  8. HTN: Monitor BP tid--continue metoprolol. Lisinopril increased to 20 mg on 06/01.  9. T2DM with retinopathy: Hgb A1c-8.6. Was on Amaryl, NPH 36u am/6u pm and regular insulins--23/23/27 at home. Will continue to monitor BS ac/hs. Continue Levemir bid  10. FUO: UA pending. I ordered a UCX as well. Pt complains of urine retention. No other obvious sources.  - Encourage  IS.   -check dopplers.  10. ABLA: Likely due to bleed. Monitor -recheck CBC in am.  11. Macrocytosis: Will check vitamin B12 levels.  12. CKD:  Baseline SCr- 1.5-1.6. Has improved with hydration--d/c IVF to avoid overload (has mild to moderate AS).Will monitor with serial checks.  13. Interstitial lung disease: IS/OOB  -monitor clinically 14. Anxiety disorder: On Zoloft. Will continue Seroquel and Klonopin at bedtime.   -check sleep chart 15.Dyslipidemia: Statin being held due to bleed--resume at discharge.      Bary Leriche, PA-C 06/22/2019

## 2019-06-22 NOTE — Progress Notes (Signed)
Inpatient Rehabilitation Medication Review by a Pharmacist  A complete drug regimen review was completed for this patient to identify any potential clinically significant medication issues.  Clinically significant medication issues were identified:  yes   Type of Medication Issue Identified Description of Issue Plan Plan Accepted by Provider? (Yes / No)  Drug Interaction(s) (clinically significant)      Duplicate Therapy      Allergy      No Medication Administration End Date      Incorrect Dose  Famotidine dose too high for renal function Dose adjust to 20mg  daily  Yes   Additional Drug Therapy Needed  Metoprolol succinate 50mg  daily was not re-ordered after transition. Neurology discharge plan was to continue it  Resume on 6/4  Yes  Other        Name of provider notified for issues identified: Algis Liming  Provider Method of Notification: Secure Chat   Pharmacist comments: Famotidine dose decreased and metoprolol ordered after discussion  Time spent performing this drug regimen review (minutes):  [place], PharmD, BCPS, [facility] Clinical Pharmacist  Please check AMION for all [place] phone numbers After 10:00 PM, call [place] [phone]

## 2019-06-22 NOTE — Progress Notes (Signed)
Pt oral temp at 0425 101.2. This nurse administered 650mg  tylenol. Will re-check temp and continue to monitor pt.

## 2019-06-22 NOTE — H&P (Signed)
Physical Medicine and Rehabilitation Admission H&P        Chief Complaint  Patient presents with  . Functional deficits due to [place]      HPI:  Sandra [name] is an 86 year old right-handed female with history of prior hemorrhagic stroke/TIA, T2DM with retinopathy, CKD (Dr. Blanche East), ILD (Dr. Vaughan Browner), chronic LBP, fall 06/06/19 with mild injuries and recurrent fall on 06/17/19 with reports of persistent HA and right elbow pain. She presented to the ED that evening but left due to wait time.  She was admitted on 06/18/2019 with reports of confusion, headaches, dizziness and difficulty walking.  She was found to have left hemianopsia with mild neglect and CT of head done revealing focal hemorrhage in middle occipital lobe likely from hemorrhagic infarct and mild cytotoxic edema with small vessel disease.  MRI brain done revealing hematoma right posterior temporoparietal lobe containing methemoglobin and extension into subdural speech with small SDH and evidence of prior microhemorrhages in left frontal and left parietal lobe likely due to hypertensive bleed.   Neurology question hypertension versus trauma versus amyloid as cause of bleed.  Aspirin was discontinued and Cleviprex added to keep SBP less than 140.  2D echo done revealing EF 60 to 65% with grade 1 diastolic dysfunction and mild to moderate aortic sclerosis.  MRA was negative for stenosis, aneurysm or vascular malformation.  Dr. Erlinda Hong felt that liver bleed without secondary cause and felt that she was a good candidate for SATURN trial.  She did develop fever 101.2 early am and UA ordered for work up.  She continues to have limitations due to headaches as well as chronic back pain, balance deficits, right gaze preference with left inattention and decreased awareness of deficits.  CIR was recommended due to functional decline     Review of Systems  Constitutional: Negative for chills and fever.  HENT: Negative for hearing loss.   Eyes:  Negative for blurred vision and double vision.  Respiratory: Positive for shortness of breath (chronic).   Cardiovascular: Positive for chest pain (chest wall pain).  Genitourinary: Positive for frequency (chronic) and urgency.  Musculoskeletal: Positive for back pain.  Skin: Negative for rash.  Neurological: Positive for weakness and headaches.  Psychiatric/Behavioral: The patient is nervous/anxious.           Past Medical History:  Diagnosis Date  . Abnormal blood finding 02/10/2016  . Abrasion of ear canal 09/28/2012  . Achilles tendon injury 11/27/2010  . ACID REFLUX DISEASE 05/12/2006    Qualifier: Diagnosis of  By: Larose Kells MD, [place] Acute MI ([place])    . ALLERGIC RHINITIS 05/12/2006    Qualifier: Diagnosis of  By: Larose Kells MD, [place] Anxiety state 07/03/2009    Qualifier: Diagnosis of  By: Larose Kells MD, [place] pain 07/01/2010  . Chronic kidney disease (CKD), stage III (moderate) ([place]) 05/31/2014  . Chronic right SI joint pain 09/22/2013  . DEGENERATIVE JOINT DISEASE, CERVICAL SPINE 06/23/2006    Annotation: had a CAT scan with a cervical myelogram that show  left C6-7 and  C5-6 foraminal stenosis Qualifier: Diagnosis of  By: Larose Kells MD, [place] DEPRESSION 05/12/2006    Qualifier: Diagnosis of  By: Larose Kells MD, [place] DIABETES MELLITUS, TYPE II 05/12/2006    Now following w/ Endo at [facility] 04/27/2007    Qualifier: Diagnosis of  By: Etter Sjogren DO,  Kendrick Fries    . Dyspnea 02/10/2016  . Encephalopathy, hypertensive    . Essential hypertension 05/12/2006    Qualifier: Diagnosis of  By: Larose Kells MD, [place] Fatigue 05/31/2014  . GAIT DISTURBANCE 08/07/2009    Qualifier: Diagnosis of  By: Larose Kells MD, [place] GERD (gastroesophageal reflux disease)    . Headache(784.0) 11/27/2010  . Hyperlipidemia    . Hyperlipidemia associated with type 2 diabetes mellitus ([place]) 05/12/2006    Qualifier: Diagnosis of  By: Larose Kells MD, [place] ILD (interstitial lung disease) ([place]) 05/31/2015   . Leukocytopenia 05/31/2014  . Lower abdominal pain 05/17/2014  . Nausea with vomiting 09/22/2013  . NECK PAIN, CHRONIC 04/03/2008    Qualifier: Diagnosis of  By: Larose Kells MD, [place] Nodule on liver 12/06/2015  . Osteoarthritis 02/10/2016  . Osteopenia    . Osteoporosis 06/23/2006    Annotation: had a bone density test in 08-2004.  T score was -2.4 Qualifier: Diagnosis of  By: Larose Kells MD, [place] SAH (subarachnoid hemorrhage) ([place])    . Sciatica    . Scleroderma ([place]) 02/28/2016  . Severe hypertension 03/18/2018  . Slurred speech 11/27/2010  . Takotsubo cardiomyopathy      s/p stress MI with stress induced CM with normal coronary arteries by cath 2007 with normalization of LVF by echo 04/2005  . TIA (transient ischemic attack)    . Unspecified cerebral artery occlusion with cerebral infarction 04/07/2012  . UTI (urinary tract infection) 11/02/2011  . 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 Vitamin D deficiency 10/01/2014           Past Surgical History:  Procedure Laterality Date  . ABDOMINAL HYSTERECTOMY   1977    no oophorectomy  . APPENDECTOMY      . CARDIAC CATHETERIZATION      . CATARACT EXTRACTION, BILATERAL   2011  . SPINAL FUSION   2000    Dr. Vertell Limber  . TONSILLECTOMY               Family History  Problem Relation Age of Onset  . Intracerebral hemorrhage Daughter          assoc with post-partum  . Stroke Mother    . Diabetes Sister    . Asthma Sister    . Diabetes Sister    . Breast cancer Other          ?Aunt  . Cancer Other          breast?  . Coronary artery disease Neg Hx        Social History: Widowed--multiple family members live with her. 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 She was independent without AD and still drives. She  reports that she is a non-smoker but has been exposed to tobacco smoke. She has never used smokeless tobacco. She reports current alcohol use of about 1.0 standard drinks of alcohol per week. She reports that she does not use drugs.           Allergies  Allergen Reactions  . Cephalexin Nausea And Vomiting      Pt stated made severely sick, will never take again  . Nintedanib Diarrhea  . Pioglitazone Other (See Comments)      REACTION: EDEMA            Medications Prior to Admission  Medication Sig Dispense Refill  . aspirin 81 MG tablet Take 81 mg by  mouth daily.      Marland Kitchen atorvastatin (LIPITOR) 40 MG tablet Take 1 tablet (40 mg total) by mouth daily at 6 PM. 30 tablet 0  . b complex vitamins tablet Take 1 tablet by mouth daily.      . Cholecalciferol (VITAMIN D) 125 MCG (5000 UT) CAPS Take 5,000 Units by mouth daily.      . clonazePAM (KLONOPIN) 0.5 MG tablet TAKE 1 TABLET TWICE DAILY AS NEEDED FOR ANXIETY (Patient taking differently: Take 0.5 mg by mouth 2 (two) times daily as needed for anxiety. ) 90 tablet 1  . Cyanocobalamin (VITAMIN B-12 PO) Take 1 tablet by mouth daily.       . famotidine (PEPCID) 40 MG tablet Take 40 mg by mouth daily.       . fenofibrate 54 MG tablet Take 54 mg by mouth daily.      Marland Kitchen glimepiride (AMARYL) 4 MG tablet Take 2 mg by mouth 2 (two) times daily.    2  . Insulin NPH, Human,, Isophane, (HUMULIN N) 100 UNIT/ML Kiwkpen Inject into the skin. Take 36 units am and 6 units pm      . insulin regular (NOVOLIN R) 100 units/mL injection Inject 23-27 Units into the skin See admin instructions. 23 units before Breakfast and lunch, 27 units before dinner.      Marland Kitchen lisinopril (PRINIVIL,ZESTRIL) 10 MG tablet Take 10 mg by mouth daily.       . metoprolol succinate (TOPROL-XL) 50 MG 24 hr tablet TAKE 1 TABLET EVERY DAY.  TAKE  WITH  OR  IMMEDIATELY FOLLOWING A MEAL  (Patient taking differently: Take 50 mg by mouth daily. ) 90 tablet 1  . sertraline (ZOLOFT) 50 MG tablet Take 50 mg by mouth daily.      . Turmeric 500 MG CAPS Take 500 mg by mouth daily.      . vitamin E (VITAMIN E) 180 MG (400 UNITS) capsule Take 400 Units by mouth daily.      Marland Kitchen ACCU-CHEK FASTCLIX LANCETS MISC        . ACCU-CHEK SMARTVIEW  test strip        . HYDROcodone-acetaminophen (NORCO/VICODIN) 5-325 MG tablet Take 1 tablet by mouth every 6 (six) hours as needed for moderate pain. (Patient not taking: Reported on 06/18/2019) 45 tablet 0  . Insulin Syringe-Needle U-100 (INSULIN SYRINGE .5CC/31GX5/16") 31G X 5/16" 0.5 ML MISC 40 units in morning and 18units at night      . Nintedanib (OFEV) 100 MG CAPS Take 1 capsule (100 mg total) by mouth 2 (two) times daily after a meal. (Patient not taking: Reported on 04/24/2019) 60 capsule 5      Drug Regimen Review  Drug regimen was reviewed and remains appropriate with no significant issues identified   Home: Home Living Family/patient expects to be discharged to:: Private residence Living Arrangements: (Lives with daughter and adult granddaughter) Available Help at Discharge: Family, Friend(s), Available 24 hours/day(family works day shift; has friends to come stay when they a) Type of Home: House Home Access: Stairs to enter Technical brewer of Steps: 2 Entrance Stairs-Rails: None Home Layout: One level Bathroom Shower/Tub: Chiropodist: Standard Bathroom Accessibility: Yes Home Equipment: Environmental consultant - 2 wheels, [employer] - single point, Civil engineer, contracting, Hand held shower head  Lives With: (daughter and adult granddaughter)   Functional History: Prior Function Level of Independence: Independent Gait / Transfers Assistance Needed: walks no devices, fell backwards last Saturday coming in the door, fell PTA ADL's /  Homemaking Assistance Needed: uses shower chair; helps with chores, likes to water her flowers; states she did her finances, shopping etc, but per chart needed assistance Comments: drives   Functional Status:  Mobility: Bed Mobility Overal bed mobility: Needs Assistance Bed Mobility: Rolling, Sidelying to Sit Rolling: Supervision Sidelying to sit: Min assist Supine to sit: HOB elevated, Min assist General bed mobility comments: up to R side of bed  with increased time, pt struggling so cues given and A to lift trunk Transfers Overall transfer level: Needs assistance Equipment used: Rolling walker (2 wheeled) Transfers: Sit to/from Stand Sit to Stand: Min assist Stand pivot transfers: Mod assist, +2 physical assistance, +2 safety/equipment General transfer comment: assist to stand pt pulling up on RW bed higher than her feet can reach the ground Ambulation/Gait Ambulation/Gait assistance: Mod assist Gait Distance (Feet): 100 Feet(x 2) Assistive device: Rolling walker (2 wheeled) Gait Pattern/deviations: Step-through pattern, Decreased stride length, Drifts right/left General Gait Details: cues for eyes on visual target for forward gaze and straight path in hallway assist for balance and to keep walker moving straight as pt veers to R consistently when not assisted; assist for safety on turns with walker management and cues for obtaining new visual target   ADL: ADL Overall ADL's : Needs assistance/impaired Eating/Feeding: Supervision/ safety, Sitting(mod cues ) Eating/Feeding Details (indicate cue type and reason): assist for set up and cues to locate food items  Grooming: Wash/dry hands, Wash/dry face, Oral care, Brushing hair, Moderate assistance, Standing Upper Body Bathing: Moderate assistance, Sitting Lower Body Bathing: Maximal assistance, Sit to/from stand Upper Body Dressing : Moderate assistance, Sitting Lower Body Dressing: Total assistance, Sit to/from stand Lower Body Dressing Details (indicate cue type and reason): Pt attempted to don socks, but unable to access feet, and frequently dropped sock when holding it in Lt UE  Toilet Transfer: Moderate assistance, +2 for physical assistance, +2 for safety/equipment, Ambulation, Comfort height toilet, RW Toileting- Clothing Manipulation and Hygiene: Maximal assistance, Sit to/from stand Functional mobility during ADLs: Moderate assistance, +2 for physical assistance, +2 for  safety/equipment, Rolling walker   Cognition: Cognition Overall Cognitive Status: Impaired/Different from baseline Arousal/Alertness: Awake/alert Orientation Level: Oriented X4 Attention: Focused, Sustained Focused Attention: Appears intact Sustained Attention: Appears intact Memory: Impaired Memory Impairment: Decreased long term memory Decreased Long Term Memory: Verbal basic Awareness: Impaired Awareness Impairment: Intellectual impairment, Emergent impairment Problem Solving: Impaired Problem Solving Impairment: Functional basic Cognition Arousal/Alertness: Awake/alert Behavior During Therapy: WFL for tasks assessed/performed Overall Cognitive Status: Impaired/Different from baseline Area of Impairment: Attention, Memory, Following commands, Safety/judgement, Awareness, Problem solving Current Attention Level: Sustained Memory: Decreased short-term memory Following Commands: Follows one step commands consistently, Follows multi-step commands with increased time Safety/Judgement: Decreased awareness of safety, Decreased awareness of deficits Awareness: Intellectual Problem Solving: Slow processing, Difficulty sequencing, Requires verbal cues, Requires tactile cues General Comments: needs multimodal cues to find objects and room number to L of midline     Blood pressure (!) 155/43, pulse 65, temperature 98.4 F (36.9 C), temperature source Oral, resp. rate 18, SpO2 99 %. Physical Exam  Constitutional: No distress.  HENT:  Head: Normocephalic and atraumatic.  Eyes: Pupils are equal, round, and reactive to light. Conjunctivae are normal.  Cardiovascular: Normal rate. Exam reveals no friction rub.  No murmur heard. Respiratory: Effort normal. No respiratory distress. She has no wheezes.  GI: Soft. She exhibits no distension. There is no abdominal tenderness.  Musculoskeletal:        General: No edema. Normal range  of motion.     Cervical back: Normal range of motion.   Neurological:  Pt alert and oriented to person, place, month/year, stroke. Follows basic commands. Normal language and speech. Decreased insight and awareness. Left inattention and hemianopsia. Senses pain in all 4's. RUE and RLE 4+ to 5/5. LUE 4 to 4+/5. LLE 4/5. Decreased [place] on left, +/- PD on left. No resting tone  Skin: Skin is warm. She is not diaphoretic.  Psychiatric: She has a normal mood and affect. Her behavior is normal.      Lab Results Last 48 Hours        Results for orders placed or performed during the hospital encounter of 06/18/19 (from the past 48 hour(s))  Glucose, capillary     Status: Abnormal    Collection Time: 06/20/19 11:32 AM  Result Value Ref Range    Glucose-Capillary 178 (H) 70 - 99 mg/dL      Comment: Glucose reference range applies only to samples taken after fasting for at least 8 hours.    Comment 1 Notify RN      Comment 2 Document in Chart    Glucose, capillary     Status: Abnormal    Collection Time: 06/20/19  6:05 PM  Result Value Ref Range    Glucose-Capillary 237 (H) 70 - 99 mg/dL      Comment: Glucose reference range applies only to samples taken after fasting for at least 8 hours.    Comment 1 Notify RN      Comment 2 Document in Chart    Glucose, capillary     Status: Abnormal    Collection Time: 06/20/19 10:17 PM  Result Value Ref Range    Glucose-Capillary 134 (H) 70 - 99 mg/dL      Comment: Glucose reference range applies only to samples taken after fasting for at least 8 hours.  CBC     Status: Abnormal    Collection Time: 06/21/19  6:49 AM  Result Value Ref Range    WBC 8.9 4.0 - 10.5 K/uL    RBC 3.15 (L) 3.87 - 5.11 MIL/uL    Hemoglobin 10.7 (L) 12.0 - 15.0 g/dL    HCT 31.7 (L) 36.0 - 46.0 %    MCV 100.6 (H) 80.0 - 100.0 fL    MCH 34.0 26.0 - 34.0 pg    MCHC 33.8 30.0 - 36.0 g/dL    RDW 11.6 11.5 - 15.5 %    Platelets 221 150 - 400 K/uL    nRBC 0.0 0.0 - 0.2 %      Comment: Performed at [facility], [place] [address] [place], [place] [address]  Basic metabolic panel     Status: Abnormal    Collection Time: 06/21/19  6:49 AM  Result Value Ref Range    Sodium 136 135 - 145 mmol/L    Potassium 4.2 3.5 - 5.1 mmol/L    Chloride 105 98 - 111 mmol/L    CO2 19 (L) 22 - 32 mmol/L    Glucose, Bld 198 (H) 70 - 99 mg/dL      Comment: Glucose reference range applies only to samples taken after fasting for at least 8 hours.    BUN 18 8 - 23 mg/dL    Creatinine, Ser 1.42 (H) 0.44 - 1.00 mg/dL    Calcium 8.6 (L) 8.9 - 10.3 mg/dL    GFR calc non Af Amer 34 (L) >60 mL/min    GFR calc Af Wyvonnia Lora  39 (L) >60 mL/min    Anion gap 12 5 - 15      Comment: Performed at [facility] [address] [place], [place] [address]  Glucose, capillary     Status: Abnormal    Collection Time: 06/21/19  7:31 AM  Result Value Ref Range    Glucose-Capillary 208 (H) 70 - 99 mg/dL      Comment: Glucose reference range applies only to samples taken after fasting for at least 8 hours.    Comment 1 Notify RN      Comment 2 Document in Chart    Glucose, capillary     Status: Abnormal    Collection Time: 06/21/19 11:38 AM  Result Value Ref Range    Glucose-Capillary 213 (H) 70 - 99 mg/dL      Comment: Glucose reference range applies only to samples taken after fasting for at least 8 hours.  Glucose, capillary     Status: Abnormal    Collection Time: 06/21/19  4:48 PM  Result Value Ref Range    Glucose-Capillary 214 (H) 70 - 99 mg/dL      Comment: Glucose reference range applies only to samples taken after fasting for at least 8 hours.    Comment 1 Notify RN      Comment 2 Document in Chart    Glucose, capillary     Status: Abnormal    Collection Time: 06/21/19  9:10 PM  Result Value Ref Range    Glucose-Capillary 207 (H) 70 - 99 mg/dL      Comment: Glucose reference range applies only to samples taken after fasting for at least 8 hours.    Comment 1 Notify RN      Comment 2 Document in Chart    CBC     Status: Abnormal     Collection Time: 06/22/19  3:43 AM  Result Value Ref Range    WBC 8.0 4.0 - 10.5 K/uL    RBC 3.05 (L) 3.87 - 5.11 MIL/uL    Hemoglobin 10.1 (L) 12.0 - 15.0 g/dL    HCT 30.8 (L) 36.0 - 46.0 %    MCV 101.0 (H) 80.0 - 100.0 fL    MCH 33.1 26.0 - 34.0 pg    MCHC 32.8 30.0 - 36.0 g/dL    RDW 11.5 11.5 - 15.5 %    Platelets 201 150 - 400 K/uL    nRBC 0.0 0.0 - 0.2 %      Comment: Performed at [facility], [place] [address] [place], [place] [address]  Basic metabolic panel     Status: Abnormal    Collection Time: 06/22/19  3:43 AM  Result Value Ref Range    Sodium 134 (L) 135 - 145 mmol/L    Potassium 4.0 3.5 - 5.1 mmol/L    Chloride 106 98 - 111 mmol/L    CO2 19 (L) 22 - 32 mmol/L    Glucose, Bld 197 (H) 70 - 99 mg/dL      Comment: Glucose reference range applies only to samples taken after fasting for at least 8 hours.    BUN 17 8 - 23 mg/dL    Creatinine, Ser 1.30 (H) 0.44 - 1.00 mg/dL    Calcium 8.3 (L) 8.9 - 10.3 mg/dL    GFR calc non Af Amer 38 (L) >60 mL/min    GFR calc Af Amer 44 (L) >60 mL/min    Anion gap 9 5 - 15  Comment: Performed at [facility], [place] [address] [place], [place] [address]  Glucose, capillary     Status: Abnormal    Collection Time: 06/22/19  6:05 AM  Result Value Ref Range    Glucose-Capillary 165 (H) 70 - 99 mg/dL      Comment: Glucose reference range applies only to samples taken after fasting for at least 8 hours.    Comment 1 Notify RN      Comment 2 Document in Chart    Glucose, capillary     Status: Abnormal    Collection Time: 06/22/19  7:27 AM  Result Value Ref Range    Glucose-Capillary 154 (H) 70 - 99 mg/dL      Comment: Glucose reference range applies only to samples taken after fasting for at least 8 hours.       Imaging Results (Last 48 hours)  MR ANGIO HEAD WO CONTRAST   Result Date: 06/20/2019 CLINICAL DATA:  Follow-up hemorrhagic stroke. Right temporoparietal insult. EXAM: MRA HEAD WITHOUT CONTRAST TECHNIQUE:  Angiographic images of the Circle of Willis were obtained using MRA technique without intravenous contrast. COMPARISON:  MRI 06/18/2019.  CT 06/18/2019. FINDINGS: Both internal carotid arteries are widely patent through the skull base and siphon region. The anterior and middle cerebral vessels are patent without proximal stenosis, aneurysm or vascular malformation. Large posterior communicating artery on the right. Both vertebral arteries are widely patent to the basilar. No basilar stenosis. Posterior circulation branch vessels are normal. No high flow vascular abnormality is seen in the region of the right temporoparietal hematoma. IMPRESSION: No evidence of aneurysm or vascular malformation to explain the right temporoparietal intraparenchymal hemorrhage. Normal MR angiography of the large and medium sized intracranial vessels. Electronically Signed   By: Nelson Chimes M.D.   On: 06/20/2019 16:45    VAS US CAROTID   Result Date: 06/21/2019 Carotid Arterial Duplex Study Indications:       CVA. Limitations        Today's exam was limited due to the high bifurcation of the                    carotid and poor penetration. Comparison Study:  04/11/18 Performing Technologist: Antonieta Pert RDMS, RVT  Examination Guidelines: A complete evaluation includes B-mode imaging, spectral Doppler, color Doppler, and power Doppler as needed of all accessible portions of each vessel. Bilateral testing is considered an integral part of a complete examination. Limited examinations for reoccurring indications may be performed as noted.  Right Carotid Findings: +----------+--------+--------+--------+------------------+--------+           PSV cm/sEDV cm/sStenosisPlaque DescriptionComments +----------+--------+--------+--------+------------------+--------+ CCA Prox  96      16                                         +----------+--------+--------+--------+------------------+--------+ CCA Distal75      15                                          +----------+--------+--------+--------+------------------+--------+ ICA Prox  107     25                                         +----------+--------+--------+--------+------------------+--------+  ICA Mid   88      20                                         +----------+--------+--------+--------+------------------+--------+ ICA Distal56      23                                         +----------+--------+--------+--------+------------------+--------+ ECA       108     14                                         +----------+--------+--------+--------+------------------+--------+ +----------+--------+-------+--------+-------------------+           PSV cm/sEDV cmsDescribeArm Pressure (mmHG) +----------+--------+-------+--------+-------------------+ [id number]                                        +----------+--------+-------+--------+-------------------+ +---------+--------+--+--------+--+---------+ VertebralPSV cm/s82EDV cm/s21Antegrade +---------+--------+--+--------+--+---------+ difficult to evaluate plaque due to poor 2D images Left Carotid Findings: +----------+--------+--------+--------+------------------+--------+           PSV cm/sEDV cm/sStenosisPlaque DescriptionComments +----------+--------+--------+--------+------------------+--------+ CCA Prox  92      13                                         +----------+--------+--------+--------+------------------+--------+ CCA Distal79      14                                         +----------+--------+--------+--------+------------------+--------+ ICA Prox  70      17                                         +----------+--------+--------+--------+------------------+--------+ ICA Mid   85      21                                         +----------+--------+--------+--------+------------------+--------+ ICA Distal90      27                                          +----------+--------+--------+--------+------------------+--------+ ECA       120     22                                         +----------+--------+--------+--------+------------------+--------+ +----------+--------+--------+--------+-------------------+           PSV cm/sEDV cm/sDescribeArm Pressure (mmHG) +----------+--------+--------+--------+-------------------+ [id number]                                         +----------+--------+--------+--------+-------------------+ +---------+--------+--+--------+--+---------+  VertebralPSV cm/s46EDV cm/s10Antegrade +---------+--------+--+--------+--+---------+ difficult to evaluate plaque due to poor 2D images  Summary: Right Carotid: Velocities in the right ICA are consistent with a 1-39% stenosis. Left Carotid: Velocities in the left ICA are consistent with a 1-39% stenosis. Vertebrals: Bilateral vertebral arteries demonstrate antegrade flow. *See table(s) above for measurements and observations.  Electronically signed by Antony Contras MD on 06/21/2019 at 8:28:26 AM.    Final              Medical Problem List and Plan: 1.  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 Pt with some cognitive decline prior to hospitalization as well.             -patient may  shower             -ELOS/Goals: 14-18 days, supervision to min assist goals 2.  Antithrombotics: -DVT/anticoagulation:  Pharmaceutical: Lovenox added             -antiplatelet therapy: N/A due to bleed.  3.Chronic LBP/Headaches/ Pain Management: Tylenol prn 4. Mood: LCSW to follow for evaluation and support.              -antipsychotic agents: N/a 5. Neuropsych: This patient is not fully capable of making decisions on her own behalf. 6. Skin/Wound Care: Routine pressure relief measures.  7. Fluids/Electrolytes/Nutrition: Monitor I/O. Check lytes in am.  8. HTN: Monitor BP  tid--continue metoprolol. Lisinopril increased to 20 mg on 06/01.  9. T2DM with retinopathy: Hgb A1c-8.6. Was on Amaryl, NPH 36u am/6u pm and regular insulins--23/23/27 at home. Will continue to monitor BS ac/hs. Continue Levemir bid  10. FUO: UA pending. I ordered a UCX as well. Pt complains of urine retention. No other obvious sources.             - Encourage IS.              -check dopplers.  10. ABLA: Likely due to bleed. Monitor -recheck CBC in am.  11. Macrocytosis: Will check vitamin B12 levels.  12. CKD: Baseline SCr- 1.5-1.6. Has improved with hydration--d/c IVF to avoid overload (has mild to moderate AS).Will monitor with serial checks.  13. Interstitial lung disease: IS/OOB             -monitor clinically 14. Anxiety disorder: On Zoloft. Will continue Seroquel and Klonopin at bedtime.              -check sleep chart 15.Dyslipidemia: Statin being held due to bleed--resume at discharge.        Bary Leriche, PA-C 06/22/2019  I have personally performed a face to face diagnostic evaluation of this patient and formulated the key components of the plan.  Additionally, I have personally reviewed laboratory data, imaging studies, as well as relevant notes and concur with the physician assistant's documentation above.  The patient's status has not changed from the original H&P.  Any changes in documentation from the acute care chart have been noted above.  Meredith Staggers, MD, [facility]

## 2019-06-22 NOTE — Discharge Summary (Addendum)
Stroke Discharge Summary  Patient ID: Sandra [name]   MRN: [id number]      DOB: 1933/01/23  Date of Admission: 06/18/2019 Date of Discharge: 06/22/2019  Attending Physician:  Rosalin Hawking, MD, Stroke MD Consultant(s):   Courtney Heys, MD (Physical Medicine & Rehabilitation)  Patient's PCP:  Nickola Major, MD  Discharge Diagnoses:  Principal Problem:   ICH (intracerebral hemorrhage) ([place]) - R occipital - HTN vs CAA Active Problems:   Hyperlipidemia associated with type 2 diabetes mellitus ([place])   Essential hypertension   UTI (urinary tract infection)   History of subarachnoid hemorrhage   Chronic kidney disease (CKD), stage III (moderate) ([place])   Diabetes mellitus type 2 with retinopathy ([place])   Type 2 diabetes mellitus with hyperglycemia ([place])   Cognitive impairment   Medications to be continued on Rehab Allergies as of 06/22/2019      Reactions   Cephalexin Nausea And Vomiting   Pt stated made severely sick, will never take again   Nintedanib Diarrhea   Pioglitazone Other (See Comments)   REACTION: EDEMA      Medication List    STOP taking these medications   Accu-Chek FastClix Lancets Misc   Accu-Chek SmartView test strip Generic drug: glucose blood   aspirin 81 MG tablet   atorvastatin 40 MG tablet Commonly known as: LIPITOR   b complex vitamins tablet   clonazePAM 0.5 MG tablet Commonly known as: KLONOPIN Replaced by: clonazePAM 0.5 MG disintegrating tablet   fenofibrate 54 MG tablet   glimepiride 4 MG tablet Commonly known as: AMARYL   HYDROcodone-acetaminophen 5-325 MG tablet Commonly known as: NORCO/VICODIN   Insulin NPH (Human) ([place]) 100 UNIT/ML Kiwkpen Commonly known as: HUMULIN N   insulin regular 100 units/mL injection Commonly known as: NOVOLIN R   INSULIN SYRINGE .5CC/31GX5/16" 31G X 5/16" 0.5 ML Misc   Ofev 100 MG Caps Generic drug: Nintedanib   Turmeric 500 MG Caps   VITAMIN B-12 PO   Vitamin D 125 MCG (5000 UT) Caps    vitamin E 180 MG (400 UNITS) capsule Generic drug: vitamin E     TAKE these medications   Chlorhexidine Gluconate Cloth 2 % Pads Apply 6 each topically daily. Start taking on: June 23, 2019   clonazePAM 0.5 MG disintegrating tablet Commonly known as: KLONOPIN Take 1 tablet (0.5 mg total) by mouth at bedtime. Replaces: clonazePAM 0.5 MG tablet   famotidine 40 MG tablet Commonly known as: PEPCID Take 40 mg by mouth daily.   insulin aspart 100 UNIT/ML injection Commonly known as: novoLOG Inject 0-15 Units into the skin 3 (three) times daily with meals.   insulin detemir 100 UNIT/ML injection Commonly known as: LEVEMIR Inject 0.1 mLs (10 Units total) into the skin 2 (two) times daily.   lisinopril 20 MG tablet Commonly known as: ZESTRIL Take 1 tablet (20 mg total) by mouth daily. Start taking on: June 23, 2019 What changed:   medication strength  how much to take   metoprolol succinate 50 MG 24 hr tablet Commonly known as: TOPROL-XL TAKE 1 TABLET EVERY DAY.  TAKE  WITH  OR  IMMEDIATELY FOLLOWING A MEAL What changed: See the new instructions.   QUEtiapine 25 MG tablet Commonly known as: SEROQUEL Take 1 tablet (25 mg total) by mouth at bedtime.   senna-docusate 8.6-50 MG tablet Commonly known as: Senokot-S Take 1 tablet by mouth 2 (two) times daily.   sertraline 50 MG tablet Commonly known as: ZOLOFT Take 50 mg  by mouth daily.   sodium chloride 0.9 % infusion Inject 50 mLs into the vein continuous.       LABORATORY STUDIES CBC    Component Value Date/Time   WBC 8.0 06/22/2019 0343   RBC 3.05 (L) 06/22/2019 0343   HGB 10.1 (L) 06/22/2019 0343   HCT 30.8 (L) 06/22/2019 0343   PLT 201 06/22/2019 0343   MCV 101.0 (H) 06/22/2019 0343   MCH 33.1 06/22/2019 0343   MCHC 32.8 06/22/2019 0343   RDW 11.5 06/22/2019 0343   LYMPHSABS 1.7 06/18/2019 1235   MONOABS 0.5 06/18/2019 1235   EOSABS 0.1 06/18/2019 1235   BASOSABS 0.0 06/18/2019 1235   CMP     Component Value Date/Time   NA 134 (L) 06/22/2019 0343   K 4.0 06/22/2019 0343   CL 106 06/22/2019 0343   CO2 19 (L) 06/22/2019 0343   GLUCOSE 197 (H) 06/22/2019 0343   BUN 17 06/22/2019 0343   CREATININE 1.30 (H) 06/22/2019 0343   CREATININE 1.49 (H) 12/06/2015 1351   CALCIUM 8.3 (L) 06/22/2019 0343   PROT 6.3 03/22/2019 1421   ALBUMIN 3.5 03/22/2019 1421   AST 39 (H) 03/22/2019 1421   ALT 40 (H) 03/22/2019 1421   ALKPHOS 70 03/22/2019 1421   BILITOT 0.3 03/22/2019 1421   GFRNONAA 38 (L) 06/22/2019 0343   GFRNONAA 33 (L) 12/06/2015 1351   GFRAA 44 (L) 06/22/2019 0343   GFRAA 38 (L) 12/06/2015 1351   Lipid Panel    Component Value Date/Time   CHOL 143 06/20/2019 0726   TRIG 191 (H) 06/20/2019 0726   HDL 29 (L) 06/20/2019 0726   CHOLHDL 4.9 06/20/2019 0726   VLDL 38 06/20/2019 0726   LDLCALC 76 06/20/2019 0726   HgbA1C  Lab Results  Component Value Date   HGBA1C 8.6 (H) 06/18/2019    SIGNIFICANT DIAGNOSTIC STUDIES DG Ribs Unilateral W/Chest Right  Result Date: 06/06/2019 CLINICAL DATA:  Right posterior rib pain after fall 2 days ago. EXAM: RIGHT RIBS AND CHEST - 3+ VIEW COMPARISON:  Chest radiograph 11/15/2017. High-resolution chest CT 10/19/2018 FINDINGS: No evidence of acute rib fracture, the oblique view is limited by overlapping osseous and soft tissue structures. There is no evidence of pneumothorax or pleural effusion. Unchanged heart size and mediastinal contours. Interstitial coarsening previously characterized on chest CT. No focal pulmonary opacity. IMPRESSION: No evidence of acute rib fracture, oblique view is limited by overlapping osseous and soft tissue structures. No pulmonary complication such as pneumothorax. Electronically Signed   By: Keith Rake M.D.   On: 06/06/2019 17:10   CT Head Wo Contrast  Result Date: 06/18/2019 CLINICAL DATA:  Headache with nausea EXAM: CT HEAD WITHOUT CONTRAST TECHNIQUE: Contiguous axial images were obtained from the base  of the skull through the vertex without intravenous contrast. COMPARISON:  March 17, 2018 FINDINGS: Brain: Ventricles and sulci are normal in size and configuration. There is a focal apparent hemorrhagic infarct arising in the mid right occipital lobe with mild surrounding edema. This focus of hemorrhage measures 3.0 x 2.6 x 4.0 cm. No other hemorrhage evident. No well-defined mass. No extra-axial fluid collection or midline shift. There is patchy small vessel disease in the centra semiovale bilaterally. Vascular: No hyperdense vessel evident. Foci of calcification noted in each carotid siphon region. Skull: Bony calvarium appears intact. Sinuses/Orbits: Mucosal thickening noted in each maxillary antrum inferiorly. There are retention cysts in the right maxillary antrum. There is mucosal thickening in several ethmoid air cells. Orbits appear symmetric  bilaterally. Other: Mastoid air cells are clear. IMPRESSION: Focal hemorrhage, presumably from hemorrhagic infarct in the mid occipital lobe measuring 3.0 x 2.6 x 4.0 cm. Mild surrounding cytotoxic appearing edema. No other hemorrhage evident. Elsewhere there is small vessel disease within portions of the centra semiovale bilaterally. Foci of arterial vascular calcification noted. Foci of paranasal sinus disease noted at several sites. Critical Value/emergent results were called by telephone at the time of interpretation on 06/18/2019 at 12:09 pm to provider [facility] , who verbally acknowledged these results. Electronically Signed   By: Lowella Grip III M.D.   On: 06/18/2019 12:10   MR ANGIO HEAD WO CONTRAST  Result Date: 06/20/2019 CLINICAL DATA:  Follow-up hemorrhagic stroke. Right temporoparietal insult. EXAM: MRA HEAD WITHOUT CONTRAST TECHNIQUE: Angiographic images of the Circle of Willis were obtained using MRA technique without intravenous contrast. COMPARISON:  MRI 06/18/2019.  CT 06/18/2019. FINDINGS: Both internal carotid arteries are widely  patent through the skull base and siphon region. The anterior and middle cerebral vessels are patent without proximal stenosis, aneurysm or vascular malformation. Large posterior communicating artery on the right. Both vertebral arteries are widely patent to the basilar. No basilar stenosis. Posterior circulation branch vessels are normal. No high flow vascular abnormality is seen in the region of the right temporoparietal hematoma. IMPRESSION: No evidence of aneurysm or vascular malformation to explain the right temporoparietal intraparenchymal hemorrhage. Normal MR angiography of the large and medium sized intracranial vessels. Electronically Signed   By: Nelson Chimes M.D.   On: 06/20/2019 16:45   MR BRAIN WO CONTRAST  Result Date: 06/18/2019 CLINICAL DATA:  Intracranial hemorrhage. Mild cognitive decline last 1-2 years. Headaches. EXAM: MRI HEAD WITHOUT CONTRAST TECHNIQUE: Multiplanar, multiecho pulse sequences of the brain and surrounding structures were obtained without intravenous contrast. COMPARISON:  CT head 06/18/2019.  MRI head 03/16/2018 FINDINGS: Brain: Hematoma right posterior temporoparietal lobe measuring approximately 2.5 x 2.9 cm. There is methemoglobin within the hematoma suggesting this may have been present for several days. There is surrounding edema. No underlying mass identified however intravenous contrast was not administered. Hemorrhage extends into the subdural space with a small right-sided subdural hematoma measuring approximately 3 mm in thickness. No significant midline shift. No hemorrhage was seen in this area on the prior MRI. There are a few scattered areas of chronic microhemorrhage including the left frontal lobe and left occipital parietal lobe. These may be related to the patient's known severe hypertension. Negative for acute ischemic infarct. Moderate chronic microvascular ischemic change in the white matter. Ventricle size and cerebral volume normal. Vascular: Normal  arterial flow voids. Skull and upper cervical spine: Negative Sinuses/Orbits: Mucosal edema paranasal sinuses. Bilateral cataract extraction Other: None IMPRESSION: Hematoma right posterior temporoparietal lobe. This contains methemoglobin suggesting it may have been present for several days. There is extension into the subdural space with a small right-sided subdural hematoma. No midline shift. Negative for acute infarct. Moderate chronic microvascular ischemic change Small areas of chronic microhemorrhage left frontal lobe and left parietal lobe. Findings suggest hypertensive bleed. Electronically Signed   By: Franchot Gallo M.D.   On: 06/18/2019 16:36   Chest Port 1 View  Result Date: 06/18/2019 CLINICAL DATA:  Intracerebral hemorrhage. EXAM: PORTABLE CHEST 1 VIEW COMPARISON:  06/06/2019 FINDINGS: Heart size is within normal limits. Low lung volumes are noted, however both lungs are clear. Cervical spine fusion hardware again noted. IMPRESSION: Low lung volumes. No active disease. Electronically Signed   By: Marlaine Hind M.D.   On:  06/18/2019 14:33   ECHOCARDIOGRAM COMPLETE  Result Date: 06/19/2019    ECHOCARDIOGRAM REPORT   Patient Name:   Sandra [name] Date of Exam: 06/19/2019 Medical Rec #:  [id number]     Height:       60.0 in Accession #:    [id number]    Weight:       147.9 lb Date of Birth:  [birth year]    BSA:          1.642 m Patient Age:    47 years      BP:           137/87 mmHg Patient Gender: F             HR:           90 bpm. Exam Location:  Inpatient Procedure: 2D Echo Indications:    stroke 434.91  History:        Patient has prior history of Echocardiogram examinations, most                 recent 03/19/2018. CAD, chronic kidney disease.; Risk                 Factors:Diabetes and Dyslipidemia.  Sonographer:    [name] Referring Phys: [place]  1. Left ventricular ejection fraction, by estimation, is 60 to 65%. The left ventricle has normal function. Left  ventricular endocardial border not optimally defined to evaluate regional wall motion. Left ventricular diastolic parameters are consistent with Grade I diastolic dysfunction (impaired relaxation).  2. Right ventricular systolic function is normal. The right ventricular size is normal. Tricuspid regurgitation signal is inadequate for assessing PA pressure.  3. The mitral valve is grossly normal. No evidence of mitral valve regurgitation. No evidence of mitral stenosis.  4. The aortic valve is abnormal. Aortic valve regurgitation is trivial. Mild to moderate aortic valve sclerosis/calcification is present, without any evidence of aortic stenosis.  5. The inferior vena cava is normal in size with greater than 50% respiratory variability, suggesting right atrial pressure of 3 mmHg. Conclusion(s)/Recommendation(s): No intracardiac source of embolism detected on this transthoracic study. A transesophageal echocardiogram is recommended to exclude cardiac source of embolism if clinically indicated. FINDINGS  Left Ventricle: Left ventricular ejection fraction, by estimation, is 60 to 65%. The left ventricle has normal function. Left ventricular endocardial border not optimally defined to evaluate regional wall motion. The left ventricular internal cavity size was normal in size. There is no left ventricular hypertrophy. Left ventricular diastolic parameters are consistent with Grade I diastolic dysfunction (impaired relaxation). Right Ventricle: The right ventricular size is normal. No increase in right ventricular wall thickness. Right ventricular systolic function is normal. Tricuspid regurgitation signal is inadequate for assessing PA pressure. Left Atrium: Left atrial size was normal in size. Right Atrium: Right atrial size was normal in size. Pericardium: There is no evidence of pericardial effusion. Presence of pericardial fat pad. Mitral Valve: The mitral valve is grossly normal. Normal mobility of the mitral valve  leaflets. No evidence of mitral valve regurgitation. No evidence of mitral valve stenosis. Tricuspid Valve: The tricuspid valve is normal in structure. Tricuspid valve regurgitation is trivial. No evidence of tricuspid stenosis. Aortic Valve: The aortic valve is abnormal. Aortic valve regurgitation is trivial. Mild to moderate aortic valve sclerosis/calcification is present, without any evidence of aortic stenosis. There is moderate calcification of the aortic valve. Aortic valve mean gradient measures 7.0 mmHg. Pulmonic Valve: The pulmonic valve was grossly normal.  Pulmonic valve regurgitation is trivial. No evidence of pulmonic stenosis. Aorta: The aortic root is normal in size and structure. Venous: The inferior vena cava is normal in size with greater than 50% respiratory variability, suggesting right atrial pressure of 3 mmHg. IAS/Shunts: The interatrial septum was not well visualized.  LEFT VENTRICLE PLAX 2D LVIDd:         4.10 cm  Diastology LVIDs:         3.00 cm  LV e' lateral:   9.03 cm/s LV PW:         1.10 cm  LV E/e' lateral: 12.6 LV IVS:        1.10 cm  LV e' medial:    6.42 cm/s LVOT diam:     1.50 cm  LV E/e' medial:  17.8 LV SV:         55 LV SV Index:   33 LVOT Area:     1.77 cm  RIGHT VENTRICLE             IVC RV S prime:     21.40 cm/s  IVC diam: 2.10 cm LEFT ATRIUM             Index       RIGHT ATRIUM           Index LA diam:        4.00 cm 2.44 cm/m  RA Area:     11.40 cm LA Vol (A2C):   43.2 ml 26.31 ml/m RA Volume:   23.50 ml  14.31 ml/m LA Vol (A4C):   40.3 ml 24.54 ml/m LA Biplane Vol: 43.1 ml 26.25 ml/m  AORTIC VALVE AV Mean Grad: 7.0 mmHg LVOT Vmax:    134.00 cm/s LVOT Vmean:   92.800 cm/s LVOT VTI:     0.310 m  AORTA Ao Asc diam: 3.00 cm MITRAL VALVE MV Area (PHT): 3.27 cm     SHUNTS MV Decel Time: 232 msec     Systemic VTI:  0.31 m MV E velocity: 114.00 cm/s  Systemic Diam: 1.50 cm MV A velocity: 122.00 cm/s MV E/A ratio:  0.93 Cherlynn Kaiser MD Electronically signed by Cherlynn Kaiser MD Signature Date/Time: 06/19/2019/4:03:15 PM    Final    VAS US CAROTID  Result Date: 06/21/2019 Carotid Arterial Duplex Study Indications:       CVA. Limitations        Today's exam was limited due to the high bifurcation of the                    carotid and poor penetration. Comparison Study:  04/11/18 Performing Technologist: Antonieta Pert RDMS, RVT  Examination Guidelines: A complete evaluation includes B-mode imaging, spectral Doppler, color Doppler, and power Doppler as needed of all accessible portions of each vessel. Bilateral testing is considered an integral part of a complete examination. Limited examinations for reoccurring indications may be performed as noted.  Right Carotid Findings: +----------+--------+--------+--------+------------------+--------+           PSV cm/sEDV cm/sStenosisPlaque DescriptionComments +----------+--------+--------+--------+------------------+--------+ CCA Prox  96      16                                         +----------+--------+--------+--------+------------------+--------+ CCA Distal75      15                                         +----------+--------+--------+--------+------------------+--------+  ICA Prox  107     25                                         +----------+--------+--------+--------+------------------+--------+ ICA Mid   88      20                                         +----------+--------+--------+--------+------------------+--------+ ICA Distal56      23                                         +----------+--------+--------+--------+------------------+--------+ ECA       108     14                                         +----------+--------+--------+--------+------------------+--------+ +----------+--------+-------+--------+-------------------+           PSV cm/sEDV cmsDescribeArm Pressure (mmHG) +----------+--------+-------+--------+-------------------+ [id number]                                         +----------+--------+-------+--------+-------------------+ +---------+--------+--+--------+--+---------+ VertebralPSV cm/s82EDV cm/s21Antegrade +---------+--------+--+--------+--+---------+ difficult to evaluate plaque due to poor 2D images Left Carotid Findings: +----------+--------+--------+--------+------------------+--------+           PSV cm/sEDV cm/sStenosisPlaque DescriptionComments +----------+--------+--------+--------+------------------+--------+ CCA Prox  92      13                                         +----------+--------+--------+--------+------------------+--------+ CCA Distal79      14                                         +----------+--------+--------+--------+------------------+--------+ ICA Prox  70      17                                         +----------+--------+--------+--------+------------------+--------+ ICA Mid   85      21                                         +----------+--------+--------+--------+------------------+--------+ ICA Distal90      27                                         +----------+--------+--------+--------+------------------+--------+ ECA       120     22                                         +----------+--------+--------+--------+------------------+--------+ +----------+--------+--------+--------+-------------------+  PSV cm/sEDV cm/sDescribeArm Pressure (mmHG) +----------+--------+--------+--------+-------------------+ [id number]                                         +----------+--------+--------+--------+-------------------+ +---------+--------+--+--------+--+---------+ VertebralPSV cm/s46EDV cm/s10Antegrade +---------+--------+--+--------+--+---------+ difficult to evaluate plaque due to poor 2D images  Summary: Right Carotid: Velocities in the right ICA are consistent with a 1-39% stenosis. Left Carotid: Velocities in the left ICA are  consistent with a 1-39% stenosis. Vertebrals: Bilateral vertebral arteries demonstrate antegrade flow. *See table(s) above for measurements and observations.  Electronically signed by Antony Contras MD on 06/21/2019 at 8:28:26 AM.    Final        HISTORY OF PRESENT ILLNESS Sandra [name] is an 86 y.o. female with PMH of HTN, MI, CKD3, DM2, depression, previous strokes and ICH with mild cognitive decline over last 1-61yr per family but able to do her self-care and ambulates w/o assistance, but relies on family for finances, shopping, cooking (mRS 1). Pt and family state she went to bed in usual state on Fri 5/28. She woke up on the floor Sat am, c/o h/a and rib pain. Her family reports that she seemed more confused than normal and had difficulty walking when they got her up. She did go to the ER for this yesterday, but due to very long wait time, she went home. Symptoms persisted and family brought her back to ER today. CTH showed ICH in the right occipital lobe. On exam there is a left hemianopsia with mild neglect. She was LKW 5/28 ~10p. ICH volume: 61ml; ICH score: 1. She was admitted to the neuro ICU.   HOSPITAL COURSE [name] is a 86 y.o. female with history of HTN, MI, CKD3, DM2, depression, previous strokes and ICH with mild cognitive decline over last 1-20yr (mRS 1) presenting with headache. CT with R occipital ICH.    Stroke:   R occipital ICH in setting of mild cognitive decline - could be hypertensive vs. amyloid angiopathy  CT head right temporoparietal hemorrhage. B Small vessel disease. Sinus dz  MRI  R posterior temporoparietal lobe ICH with SDH, no shift, mild L frontal lobe and L parietal lobe chronic microhemorrhages   MRA head unremarkable, no aneurysm or AVM  2D Echo EF 60-65%. No source of embolus   Carotid Doppler unremarkable  LDL 76  HgbA1c 8.6   SCDs for VTE prophylaxis  aspirin 81 mg daily prior to admission, now on No antithrombotic given hemorrhage    Therapy recommendations:  CIR  Disposition:  CIR  Hx stroke/TIA ? 02/2019 - office visit for transient episode of face and right body paresthesias along with right hand drawing up possibly TIA though sensory seizure orcomplicated migraine is also possible.  EEG negative. ? 03/2018 - MRI showed right occipital cortex tiny infarct.  MRA negative.  Carotid Doppler negative.  LDL 83 and A1c 9.4 recommend 30-day CardioNet monitoring.  Put on aspirin 325, Lipitor 80 and fenofibrate under 60. ? 11/2010 - left parietal convexity SAH.  CTA negative.  EEG negative.  Autoimmune labs negative.  Question for RCVS  Fever   One time fever Temp 101.2  UA and urine culture pending  CXR 5/30 neg  WBC 8.0  Continue to monitor  Hypertension  Only mildly elevated on admission 143/76 but up to the 190s following arrival  Home meds:  lisinopril 10, metoprolol 50  SBP goal < 160  Treated  with cleviprex now off  On prn labetalol   Now on metoprolol 50, increased lisinopril to 20   Long-term BP goal normotensive  Hyperlipidemia  Home meds:  lipitor 40  LDL 76  Hold statin in setting of hemorrhage  Pt for enrollment in SATURN trial. [facility] managing. Please contact them at [phone] click the research icon beside pt's picture for any questions.    Diabetes type II Uncontrolled  Home meds:  Glimepiride 2 mg bid, NPH/R 36u/23u acb, 6u/27u acs  HgbA1c 8.6, goal < 7.0  CBG fluctuate  SSI  On levimir 10u bid   Close PCP follow-up for better DM control  Cognitive impairment  Daughter admitted that patient has cognitive impairment at home  RN has observed sundowning during hospitalization  On clonazepam 0.5 twice daily -> Qhs  On Seroquel 25 nightly  Other Stroke Risk Factors  Advanced age  ETOH use, advised to drink no more than 1 drink(s) a day  Family hx stroke (mother, dtr w/ ICH )  Takotsubo cardiomyopathy  Other Active  Problems  CKD stage 3, Cre 1.69->1.6->1.23 ->1.42->1.30 - on IVF @ 58 and encourage po intake   DISCHARGE EXAM Blood pressure (!) 155/43, pulse 65, temperature 98.4 F (36.9 C), temperature source Oral, resp. rate 18, SpO2 99 %. General - Well nourished, well developed, in no apparent distress.  Ophthalmologic - fundi not visualized due to noncooperation.  Cardiovascular - Regular rhythm and rate.  Patient is lying in the bed, alert and awake, orientated to place and people but not to age or time.  Speech is normal without aphasia or dysarthria, left hemianopia, left neglect, pupils equal and reactive, left gaze incomplete bilaterally, face symmetric, face appears symmetric, midline tongue, intermittent right hand resting tremor, normal bulk and tone, she is moving all extremities equally and antigravity without drift except left foot DF 4/5, intact to light touch.   Discharge Diet  Hearth healthy thin liquids  DISCHARGE PLAN  Disposition:  Transfer to [facility] for ongoing PT, OT and ST  Due to hemorrhage and risk of bleeding, do not take aspirin, aspirin-containing medications, or ibuprofen products   Recommend ongoing stroke risk factor control by Primary Care Physician at time of discharge from inpatient rehabilitation.  Follow-up PCP Nickola Major, MD in 2 weeks following discharge from rehab.  Follow-up in [facility] in 4 weeks following discharge from rehab, office to schedule an appointment.   Follow-up SATURN  trial. [facility] following.   35 minutes were spent preparing discharge.  Rosalin Hawking, MD PhD Stroke Neurology 06/22/2019 5:27 PM

## 2019-06-23 ENCOUNTER — Inpatient Hospital Stay (HOSPITAL_COMMUNITY): Payer: Medicare HMO | Admitting: Physical Therapy

## 2019-06-23 ENCOUNTER — Inpatient Hospital Stay (HOSPITAL_COMMUNITY): Payer: Medicare HMO | Admitting: Speech Pathology

## 2019-06-23 ENCOUNTER — Inpatient Hospital Stay (HOSPITAL_COMMUNITY): Payer: Medicare HMO

## 2019-06-23 ENCOUNTER — Inpatient Hospital Stay (HOSPITAL_COMMUNITY): Payer: Medicare HMO | Admitting: Occupational Therapy

## 2019-06-23 DIAGNOSIS — D62 Acute posthemorrhagic anemia: Secondary | ICD-10-CM

## 2019-06-23 DIAGNOSIS — E11319 Type 2 diabetes mellitus with unspecified diabetic retinopathy without macular edema: Secondary | ICD-10-CM

## 2019-06-23 DIAGNOSIS — N1832 Chronic kidney disease, stage 3b: Secondary | ICD-10-CM

## 2019-06-23 DIAGNOSIS — I639 Cerebral infarction, unspecified: Secondary | ICD-10-CM

## 2019-06-23 DIAGNOSIS — E8809 Other disorders of plasma-protein metabolism, not elsewhere classified: Secondary | ICD-10-CM

## 2019-06-23 DIAGNOSIS — R509 Fever, unspecified: Secondary | ICD-10-CM

## 2019-06-23 DIAGNOSIS — D7589 Other specified diseases of blood and blood-forming organs: Secondary | ICD-10-CM

## 2019-06-23 DIAGNOSIS — E46 Unspecified protein-calorie malnutrition: Secondary | ICD-10-CM

## 2019-06-23 LAB — CBC WITH DIFFERENTIAL/PLATELET
Abs Immature Granulocytes: 0.04 10*3/uL (ref 0.00–0.07)
Basophils Absolute: 0 10*3/uL (ref 0.0–0.1)
Basophils Relative: 0 %
Eosinophils Absolute: 0.1 10*3/uL (ref 0.0–0.5)
Eosinophils Relative: 1 %
HCT: 30.8 % — ABNORMAL LOW (ref 36.0–46.0)
Hemoglobin: 10.3 g/dL — ABNORMAL LOW (ref 12.0–15.0)
Immature Granulocytes: 0 %
Lymphocytes Relative: 18 %
Lymphs Abs: 1.7 10*3/uL (ref 0.7–4.0)
MCH: 33.3 pg (ref 26.0–34.0)
MCHC: 33.4 g/dL (ref 30.0–36.0)
MCV: 99.7 fL (ref 80.0–100.0)
Monocytes Absolute: 0.9 10*3/uL (ref 0.1–1.0)
Monocytes Relative: 9 %
Neutro Abs: 6.4 10*3/uL (ref 1.7–7.7)
Neutrophils Relative %: 72 %
Platelets: 215 10*3/uL (ref 150–400)
RBC: 3.09 MIL/uL — ABNORMAL LOW (ref 3.87–5.11)
RDW: 11.4 % — ABNORMAL LOW (ref 11.5–15.5)
WBC: 9.1 10*3/uL (ref 4.0–10.5)
nRBC: 0 % (ref 0.0–0.2)

## 2019-06-23 LAB — COMPREHENSIVE METABOLIC PANEL
ALT: 31 U/L (ref 0–44)
AST: 27 U/L (ref 15–41)
Albumin: 2.9 g/dL — ABNORMAL LOW (ref 3.5–5.0)
Alkaline Phosphatase: 78 U/L (ref 38–126)
Anion gap: 11 (ref 5–15)
BUN: 15 mg/dL (ref 8–23)
CO2: 20 mmol/L — ABNORMAL LOW (ref 22–32)
Calcium: 8.8 mg/dL — ABNORMAL LOW (ref 8.9–10.3)
Chloride: 105 mmol/L (ref 98–111)
Creatinine, Ser: 1.34 mg/dL — ABNORMAL HIGH (ref 0.44–1.00)
GFR calc Af Amer: 42 mL/min — ABNORMAL LOW (ref >60)
GFR calc non Af Amer: 37 mL/min — ABNORMAL LOW (ref >60)
Glucose, Bld: 173 mg/dL — ABNORMAL HIGH (ref 70–99)
Potassium: 3.7 mmol/L (ref 3.5–5.1)
Sodium: 136 mmol/L (ref 135–145)
Total Bilirubin: 1.1 mg/dL (ref 0.3–1.2)
Total Protein: 6.3 g/dL — ABNORMAL LOW (ref 6.5–8.1)

## 2019-06-23 LAB — GLUCOSE, CAPILLARY
Glucose-Capillary: 158 mg/dL — ABNORMAL HIGH (ref 70–99)
Glucose-Capillary: 228 mg/dL — ABNORMAL HIGH (ref 70–99)
Glucose-Capillary: 232 mg/dL — ABNORMAL HIGH (ref 70–99)
Glucose-Capillary: 211 mg/dL — ABNORMAL HIGH (ref 70–99)
Glucose-Capillary: 262 mg/dL — ABNORMAL HIGH (ref 70–99)
Glucose-Capillary: 250 mg/dL — ABNORMAL HIGH (ref 70–99)

## 2019-06-23 LAB — URINALYSIS, ROUTINE W REFLEX MICROSCOPIC
Bilirubin Urine: NEGATIVE
Glucose, UA: 50 mg/dL — AB
Hgb urine dipstick: NEGATIVE
Ketones, ur: NEGATIVE mg/dL
Leukocytes,Ua: NEGATIVE
Nitrite: NEGATIVE
Protein, ur: NEGATIVE mg/dL
Specific Gravity, Urine: 1.011 (ref 1.005–1.030)
pH: 5 (ref 5.0–8.0)

## 2019-06-23 LAB — VITAMIN B12: Vitamin B-12: 181 pg/mL (ref 180–914)

## 2019-06-23 MED ORDER — BLOOD PRESSURE CONTROL BOOK
Freq: Once | Status: AC
  Filled 2019-06-23: qty 1

## 2019-06-23 MED ORDER — PRO-STAT SUGAR FREE PO LIQD
30.0000 mL | Freq: Two times a day (BID) | ORAL | Status: DC
  Administered 2019-06-23 – 2019-07-05 (×25): 30 mL via ORAL
  Filled 2019-06-23 (×25): qty 30

## 2019-06-23 MED ORDER — VITAMIN B-12 1000 MCG PO TABS
500.0000 ug | ORAL_TABLET | Freq: Every day | ORAL | Status: DC
  Administered 2019-06-23 – 2019-07-05 (×13): 500 ug via ORAL
  Filled 2019-06-23 (×13): qty 1

## 2019-06-23 MED ORDER — LIVING WELL WITH DIABETES BOOK
Freq: Once | Status: AC
  Filled 2019-06-23: qty 1

## 2019-06-23 MED ORDER — CLONAZEPAM 0.25 MG PO TBDP
0.2500 mg | ORAL_TABLET | Freq: Two times a day (BID) | ORAL | Status: DC | PRN
  Administered 2019-06-23 – 2019-06-30 (×2): 0.25 mg via ORAL
  Filled 2019-06-23 (×2): qty 1

## 2019-06-23 MED ORDER — SENNA 8.6 MG PO TABS: 2.0000 | ORAL_TABLET | Freq: Every day | ORAL | Status: DC | PRN

## 2019-06-23 MED ORDER — CLONIDINE HCL 0.1 MG PO TABS: 0.1000 mg | ORAL_TABLET | Freq: Four times a day (QID) | ORAL | Status: DC | PRN

## 2019-06-23 NOTE — Progress Notes (Signed)
[place] PHYSICAL MEDICINE & REHABILITATION PROGRESS NOTE  Subjective/Complaints: Patient seen laying in bed this morning.  She states she slept well overnight.  She states she is ready begin therapies.  ROS: Denies CP, shortness of breath, nausea, vomiting, diarrhea, fevers, chills.  Objective: Vital Signs: Blood pressure (!) 166/55, pulse 88, temperature 99.3 F (37.4 C), temperature source Oral, resp. rate 18, height 5' (1.524 m), weight 67.2 kg, SpO2 94 %. No results found. Recent Labs    06/22/19 0343 06/23/19 0653  WBC 8.0 9.1  HGB 10.1* 10.3*  HCT 30.8* 30.8*  PLT 201 215   Recent Labs    06/22/19 0343 06/23/19 0653  NA 134* 136  K 4.0 3.7  CL 106 105  CO2 19* 20*  GLUCOSE 197* 173*  BUN 17 15  CREATININE 1.30* 1.34*  CALCIUM 8.3* 8.8*    Physical Exam: BP (!) 166/55 (BP Location: Right Arm)   Pulse 88   Temp 99.3 F (37.4 C) (Oral)   Resp 18   Ht 5' (1.524 m)   Wt 67.2 kg   SpO2 94%   BMI 28.93 kg/m  Constitutional: No distress . Vital signs reviewed. HENT: Normocephalic.  Atraumatic. Eyes: EOMI. No discharge. Cardiovascular: No JVD. Respiratory: Normal effort.  No stridor. GI: Non-distended. Skin: Warm and dry.  Intact. Psych: Normal mood.  Normal behavior. Musc: No edema in extremities.  No tenderness in extremities. Neurological: Alert and oriented Follows basic commands.  Left hemianopsia Motor: RUE/RLE: 5/5 proximal to distal LUE: 4+/5 proximal to distal LLE: 4-4+/5 proximal to distal LUE ataxia  Assessment/Plan: 1. Functional deficits secondary to right occipital ICH which require 3+ hours per day of interdisciplinary therapy in a comprehensive inpatient rehab setting.  Physiatrist is providing close team supervision and 24 hour management of active medical problems listed below.  Physiatrist and rehab team continue to assess barriers to discharge/monitor patient progress toward functional and medical goals  Care  Tool:  Bathing              Bathing assist       Upper Body Dressing/Undressing Upper body dressing        Upper body assist      Lower Body Dressing/Undressing Lower body dressing            Lower body assist       Toileting Toileting    Toileting assist Assist for toileting: Dependent - Patient 0%     Transfers Chair/bed transfer  Transfers assist           Locomotion Ambulation   Ambulation assist              Walk 10 feet activity   Assist           Walk 50 feet activity   Assist           Walk 150 feet activity   Assist           Walk 10 feet on uneven surface  activity   Assist           Wheelchair     Assist               Wheelchair 50 feet with 2 turns activity    Assist            Wheelchair 150 feet activity     Assist            Medical Problem List and Plan: 1.Left sided weakness  and visual-spatial, cognitive deficitssecondary to right occipital ICH (hypertensive vs amyloid angiopathy). Pt with some cognitive decline prior to hospitalization as well.  Begin CIR evaluations 2. Antithrombotics: -DVT/anticoagulation:Pharmaceutical:Lovenoxadded -antiplatelet therapy: N/A due to bleed. 3.Chronic LBP/Headaches/Pain Management: Tylenol prn 4. Mood:LCSW to follow for evaluation and support. -antipsychotic agents: N/a 5. Neuropsych: This patientis not fullycapable of making decisions on herown behalf. 6. Skin/Wound Care:Routine pressure relief measures. 7. Fluids/Electrolytes/Nutrition:Monitor I/O.  8. HTN: Monitor BP -continue metoprolol. Lisinopril increased to 20 mg on 06/01.   Monitor with increased mobility 9. T2DM with retinopathy: Hgb A1c-8.6. Was on Amaryl, [id number] am/6u pmand regular insulins--23/23/27at home. Will continue to monitor BS ac/hs. Continue Levemir bid   Monitor with increased mobility 10. FUO:   Encourage IS.  Dopplers pending  UA unremarkable, Ucx pending 10. ABLA: Likely due to bleed.   Hb 10.3 on 6/4  Cont to monitor 11. Macrocytosis:  Vitamin B12 levels low on 6/4  Supplement initiated 12. CKD: Baseline SCr- 1.5-1.6.   Cr. 1.34 on 6/4, labs ordered for Monday  Cont to monitor 13. Interstitial lung disease:IS/OOB 14. Anxiety disorder:On Zoloft. Will continue Seroquel and Klonopin at bedtime. -check sleep chart 15.Dyslipidemia: Statin being held due to bleed--resume at discharge. 16. Hypoalbuminemia  Supplement initiated on 6/4  LOS: 1 days A FACE TO FACE EVALUATION WAS PERFORMED  Felise Georgia Lorie Phenix 06/23/2019, 9:07 AM

## 2019-06-23 NOTE — Evaluation (Signed)
Occupational Therapy Assessment and Plan  Patient Details  Name: Sandra [name] MRN: [id number] Date of Birth: 12-Jun-1933  OT Diagnosis: abnormal posture, acute pain, apraxia, ataxia, cognitive deficits, disturbance of vision, hemiplegia affecting non-dominant side and muscle weakness (generalized) Rehab Potential: Rehab Potential (ACUTE ONLY): Good ELOS: 14-18 days   Today's Date: 06/23/2019 OT Individual Time: 1100-1200 OT Individual Time Calculation (min): 60 min     Problem List:  Patient Active Problem List   Diagnosis Date Noted  . Hypoalbuminemia due to protein-calorie malnutrition (Sandra [name])   . Macrocytosis   . Acute blood loss anemia   . FUO (fever of unknown origin)   . Diabetic retinopathy of left eye associated with type 2 diabetes mellitus (Sandra [name])   . Cognitive impairment 06/22/2019  . Lobar cerebral hemorrhage (Sandra [name]) 06/22/2019  . ICH (intracerebral hemorrhage) (Sandra [name]) - R occipital - HTN vs CAA 06/18/2019  . TIA (transient ischemic attack) 04/26/2019  . Diarrhea 04/24/2019  . Severe hypertension 03/18/2018  . Encephalopathy, hypertensive   . Acute encephalopathy 03/17/2018  . Scleroderma (Sandra [name]) 02/28/2016  . Dyspnea 02/10/2016  . Abnormal blood finding 02/10/2016  . CAD (coronary artery disease) 02/10/2016  . Osteoarthritis 02/10/2016  . Nodule on liver 12/06/2015  . ILD (interstitial lung disease) (Sandra [name]) 05/31/2015  . Type 2 diabetes mellitus with hyperglycemia (Sandra [name]) 03/11/2015  . Diabetes mellitus type 2 with retinopathy (Sandra [name]) 03/04/2015  . Vitamin D deficiency 10/01/2014  . Leukocytopenia 05/31/2014  . Chronic kidney disease (CKD), stage III (moderate) (Sandra [name]) 05/31/2014  . Fatigue 05/31/2014  . Lower abdominal pain 05/17/2014  . Nausea with vomiting 09/22/2013  . Chronic right SI joint pain 09/22/2013  . 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 Abrasion of ear canal 09/28/2012  . History of subarachnoid hemorrhage 04/07/2012  . Unspecified cerebral artery occlusion with  cerebral infarction 04/07/2012  . UTI (urinary tract infection) 11/02/2011  . General medical examination 01/07/2011  . Chest pain 11/27/2010  . Achilles tendon injury 11/27/2010  . Back pain 07/01/2010  . Breast pain 04/15/2010  . GAIT DISTURBANCE 08/07/2009  . Anxiety state 07/03/2009  . VALVULAR HEART DISEASE 05/23/2008  . NECK PAIN, CHRONIC 04/03/2008  . DEGENERATIVE JOINT DISEASE, CERVICAL SPINE 06/23/2006  . Osteoporosis 06/23/2006  . Hyperlipidemia associated with type 2 diabetes mellitus (Sandra [name]) 05/12/2006  . DEPRESSION 05/12/2006  . Essential hypertension 05/12/2006  . ALLERGIC RHINITIS 05/12/2006  . ACID REFLUX DISEASE 05/12/2006  . SCIATICA 05/12/2006    Past Medical History:  Past Medical History:  Diagnosis Date  . Abnormal blood finding 02/10/2016  . Abrasion of ear canal 09/28/2012  . Achilles tendon injury 11/27/2010  . ACID REFLUX DISEASE 05/12/2006   Qualifier: Diagnosis of  By: Sandra Kells MD, Sandra [name] Acute MI (Sandra [name])   . ALLERGIC RHINITIS 05/12/2006   Qualifier: Diagnosis of  By: Sandra Kells MD, Sandra [name] Anxiety state 07/03/2009   Qualifier: Diagnosis of  By: Sandra Kells MD, Sandra [name] pain 07/01/2010  . Chronic kidney disease (CKD), stage III (moderate) (Sandra [name]) 05/31/2014  . Chronic right SI joint pain 09/22/2013  . DEGENERATIVE JOINT DISEASE, CERVICAL SPINE 06/23/2006   Annotation: had a CAT scan with a cervical myelogram that show  left C6-7 and  C5-6 foraminal stenosis Qualifier: Diagnosis of  By: Sandra Kells MD, [place] DEPRESSION 05/12/2006   Qualifier: Diagnosis of  By: Sandra Kells MD, [place] DIABETES MELLITUS, TYPE II 05/12/2006   Now following w/ Endo at [name]    .  DIABETIC  RETINOPATHY 04/27/2007   Qualifier: Diagnosis of  By: Sandra [name]    . Dyspnea 02/10/2016  . Encephalopathy, hypertensive   . Essential hypertension 05/12/2006   Qualifier: Diagnosis of  By: Sandra Kells MD, [place] Fatigue 05/31/2014  . GAIT DISTURBANCE 08/07/2009   Qualifier: Diagnosis of  By: Sandra Kells MD,  [place] GERD (gastroesophageal reflux disease)   . Headache(784.0) 11/27/2010  . Hyperlipidemia   . Hyperlipidemia associated with type 2 diabetes mellitus (Sandra [name]) 05/12/2006   Qualifier: Diagnosis of  By: Sandra Kells MD, Sandra [name] ILD (interstitial lung disease) (Sandra [name]) 05/31/2015  . Leukocytopenia 05/31/2014  . Lower abdominal pain 05/17/2014  . Nausea with vomiting 09/22/2013  . NECK PAIN, CHRONIC 04/03/2008   Qualifier: Diagnosis of  By: Sandra Kells MD, Sandra [name] Nodule on liver 12/06/2015  . Osteoarthritis 02/10/2016  . Osteopenia   . Osteoporosis 06/23/2006   Annotation: had a bone density test in 08-2004.  T score was -2.4 Qualifier: Diagnosis of  By: Sandra Kells MD, [place] SAH (subarachnoid hemorrhage) (Sandra [name])   . Sciatica   . Scleroderma ([place]) 02/28/2016  . Severe hypertension 03/18/2018  . Slurred speech 11/27/2010  . Takotsubo cardiomyopathy    s/p stress MI with stress induced CM with normal coronary arteries by cath 2007 with normalization of LVF by echo 04/2005  . TIA (transient ischemic attack)   . Unspecified cerebral artery occlusion with cerebral infarction 04/07/2012  . UTI (urinary tract infection) 11/02/2011  . 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 Vitamin D deficiency 10/01/2014   Past Surgical History:  Past Surgical History:  Procedure Laterality Date  . ABDOMINAL HYSTERECTOMY  1977   no oophorectomy  . APPENDECTOMY    . CARDIAC CATHETERIZATION    . CATARACT EXTRACTION, BILATERAL  2011  . SPINAL FUSION  2000   Dr. Vertell [name]  . TONSILLECTOMY      Assessment & Plan Clinical Impression:Sandra [name] is an 86 year old right-handed female with history of prior hemorrhagic stroke/TIA, T2DM with retinopathy, CKD (Dr. Blanche [name]), ILD (Dr. Vaughan [name]), chronic LBP, fall 06/06/19 with mild injuries and recurrent fall on 06/17/19 with reports of persistent HA and right elbow pain. She presented to the [name] that evening but left due to wait time.  She was admitted on 06/18/2019 with reports of confusion,  headaches, dizziness and difficulty walking.  She was found to have left hemianopsia with mild neglect and CT of head done revealing focal hemorrhage in middle occipital lobe likely from hemorrhagic infarct and mild cytotoxic edema with small vessel disease.  MRI brain done revealing hematoma right posterior temporoparietal lobe containing methemoglobin and extension into subdural speech with small SDH and evidence of prior microhemorrhages in left frontal and left parietal lobe likely due to hypertensive bleed.   Neurology question hypertension versus trauma versus amyloid as cause of bleed.  Aspirin was discontinued and Cleviprex added to keep SBP less than 140.  2D echo done revealing EF 60 to 65% with grade 1 diastolic dysfunction and mild to moderate aortic sclerosis.  MRA was negative for stenosis, aneurysm or vascular malformation.  Dr. Erlinda Hong felt that liver bleed without secondary cause and felt that she was a good candidate for SATURN trial.  She did develop fever 101.2 early am and UA ordered for work up.  She continues to have limitations due to headaches as well as chronic back pain, balance deficits, right gaze preference with left inattention and  decreased awareness of deficits.  CIR was recommended due to functional decline.  Patient transferred to CIR on 06/22/2019 .    Patient currently requires mod with basic self-care skills secondary to muscle weakness, decreased cardiorespiratoy endurance, unbalanced muscle activation, motor apraxia and decreased coordination, decreased visual perceptual skills, decreased visual motor skills and hemianopsia, decreased attention to left, decreased awareness, decreased safety awareness and decreased memory and decreased standing balance, decreased postural control, hemiplegia and decreased balance strategies.  Prior to hospitalization, patient was fully independent and driving.    Patient will benefit from skilled intervention to increase independence with basic  self-care skills prior to discharge home with care partner.  Anticipate patient will require 24 hour supervision and follow up outpatient.  OT - End of Session Activity Tolerance: Tolerates 10 - 20 min activity with multiple rests Endurance Deficit: Yes OT Assessment Rehab Potential (ACUTE ONLY): Good OT Patient demonstrates impairments in the following area(s): Balance;Cognition;Endurance;Motor;Vision;Sensory;Perception;Pain;Safety OT Basic ADL's Functional Problem(s): Bathing;Dressing;Toileting OT Transfers Functional Problem(s): Toilet;Tub/Shower OT Additional Impairment(s): None OT Plan OT Intensity: Minimum of 1-2 x/day, 45 to 90 minutes OT Frequency: 5 out of 7 days OT Duration/Estimated Length of Stay: 14-18 days OT Treatment/Interventions: Balance/vestibular training;Cognitive remediation/compensation;Discharge planning;DME/adaptive equipment instruction;Functional mobility training;Neuromuscular re-education;Pain management;Psychosocial support;Patient/family education;Self Care/advanced ADL retraining;Therapeutic Activities;Therapeutic Exercise;UE/LE Strength taining/ROM;UE/LE Coordination activities;Visual/perceptual remediation/compensation OT Self Feeding Anticipated Outcome(s): Independent OT Basic Self-Care Anticipated Outcome(s): supervision OT Toileting Anticipated Outcome(s): Supervision OT Bathroom Transfers Anticipated Outcome(s): Supervision to toilet, CGA to shower OT Recommendation Recommendations for Other Services: Therapeutic Recreation consult Therapeutic Recreation Interventions: Other (comment) Patient destination: Home Follow Up Recommendations: Outpatient OT   Skilled Therapeutic Intervention Pt seen for initial evaluation and ADL training with B/d at sink level to include sit to stand and transfers.  Overall, pt participated well and responded well to cues.  Her main obstacles are her L visual field impairments, L visual attention to environment, and L  coordination.  This was most obvious when she stands and transfers as she needs considerable cuing and guidance with rotating her body and stepping to her left.   Pt expressed her main goal was to return to driving so she could visit her sister.  Explained driving is not recommended for at least 6 months and not at all if she continues to have visual/ perceptual impairments.  Pt expressed that she will HAVE to get back to driving so will need to do family education on that subject.   Pt resting in recliner with all needs met and belt alarm on.     OT Evaluation Precautions/Restrictions  Precautions Precautions: Fall Precaution Comments: L hemianopsia, L inattention, apraxia, severe back pain (would use back precautions for comfort) Restrictions Weight Bearing Restrictions: No Pain Pain Assessment Pain Scale: 0-10 Pain Score: 4  Pain Type: Chronic pain Pain Location: Back Pain Orientation: Lower Pain Descriptors / Indicators: Aching Pain Onset: On-going Patients Stated Pain Goal: 3 Pain Intervention(s): Repositioned Multiple Pain Sites: No Home Living/Prior Functioning Home Living Family/patient expects to be discharged to:: Private residence Living Arrangements: Children, Other relatives Available Help at Discharge: Family, Friend(s), Available 24 hours/day Type of Home: House Home Access: Stairs to enter Technical brewer of Steps: 2 Entrance Stairs-Rails: None Home Layout: One level Bathroom Shower/Tub: Chiropodist: Standard Bathroom Accessibility: Yes  Lives With: Other (Comment), Daughter(and adult grand-daughter) Prior Function Level of Independence: Independent with basic ADLs, Independent with gait, Independent with homemaking with ambulation, Independent with transfers  Able to Take Stairs?: Yes Driving: Yes  Vocation: Retired Comments: drives ADL ADL Eating: Set up Grooming: Setup Upper Body Bathing: Setup Where Assessed-Upper Body  Bathing: Sitting at sink Lower Body Bathing: Moderate assistance Where Assessed-Lower Body Bathing: Sitting at sink Upper Body Dressing: Moderate assistance Where Assessed-Upper Body Dressing: Sitting at sink Lower Body Dressing: Moderate assistance Where Assessed-Lower Body Dressing: Sitting at sink Toileting: Maximal assistance Where Assessed-Toileting: Glass blower/designer: Moderate assistance Toilet Transfer Method: Stand pivot Toilet Transfer Equipment: Grab bars Vision Baseline Vision/History: Wears glasses Wears Glasses: At all times Patient Visual Report: No change from baseline Vision Assessment?: Yes Eye Alignment: Within Functional Limits Ocular Range of Motion: Within Functional Limits Tracking/Visual Pursuits: Requires cues, head turns, or add eye shifts to track;Impaired - to be further tested in functional context Visual Fields: Left homonymous hemianopsia;Left visual field deficit Depth Perception: Overshoots Additional Comments: often loses objects in her L visual field and turns her head instead or simply does not track; significnat peripheral vision impairments noted. L hemianopsia. Perception  Perception: Impaired Praxis Praxis: Impaired Cognition Overall Cognitive Status: Impaired/Different from baseline Arousal/Alertness: Awake/alert Orientation Level: Person;[name];Situation Person: Oriented [name]: Oriented Situation: Oriented Year: 2021("2010"  are you sure? "no, wait it is 2021!") Month: June Day of Week: Correct Memory: Impaired Memory Impairment: Decreased long term memory Decreased Long Term Memory: Verbal basic Immediate Memory Recall: Sock;Blue;Bed Memory Recall Sock: Without Cue Memory Recall Blue: Without Cue Memory Recall Bed: Without Cue Attention: Sustained Focused Attention: Appears intact Sustained Attention: Impaired Awareness: Impaired Problem Solving: Impaired Behaviors: Perseveration Safety/Judgment:  Impaired Sensation Sensation Light Touch: Appears Intact Hot/Cold: Appears Intact Proprioception: Impaired by gross assessment Stereognosis: Impaired by gross assessment Coordination Gross Motor Movements are Fluid and Coordinated: No Fine Motor Movements are Fluid and Coordinated: No Coordination and Movement Description: dysmetria and slow LUE movement patterns, but could actively use it to wash self, apply deoderant, open containers Finger Nose Finger Test: significant ataxia noted in L UE>L LE Heel Shin Test: unable to truly assess due to back pain when attempting test, very low levels of ataxia noted in with foot tapping Motor  Motor Motor: Motor apraxia Motor - Skilled Clinical Observations: mild hemiparesis, L side sligntly weaker than R but not flaccid/significantly weaker Mobility  Bed Mobility Bed Mobility: Rolling Right;Rolling Left;Supine to Sit;Sit to Supine Rolling Right: Moderate Assistance - Patient 50-74% Rolling Left: Moderate Assistance - Patient 50-74% Supine to Sit: Maximal Assistance - Patient - Patient 25-49% Sit to Supine: Maximal Assistance - Patient 25-49% Transfers Sit to Stand: Maximal Assistance - Patient 25-49% Stand to Sit: Maximal Assistance - Patient 25-49%  Trunk/Postural Assessment  Cervical Assessment Cervical Assessment: Exceptions to WFL(forward head) Thoracic Assessment Thoracic Assessment: Exceptions to WFL(kyphotic) Lumbar Assessment Lumbar Assessment: Exceptions to WFL(posteior pelvic tilt) Postural Control Postural Control: Deficits on evaluation  Balance Balance Balance Assessed: Yes Static Sitting Balance Static Sitting - Level of Assistance: 5: Stand by assistance Dynamic Sitting Balance Dynamic Sitting - Level of Assistance: 4: Min assist Static Standing Balance Static Standing - Level of Assistance: 4: Min assist Dynamic Standing Balance Dynamic Standing - Level of Assistance: 3: Mod assist Extremity/Trunk Assessment RUE  Assessment RUE Assessment: Within Functional Limits LUE Assessment LUE Assessment: Not tested Active Range of Motion (AROM) Comments: WFL General Strength Comments: 4-/5 throughout     Refer to Care Plan for Long Term Goals  Recommendations for other services: Therapeutic Recreation  Other gardening   Discharge Criteria: Patient will be discharged from OT if patient refuses treatment 3 consecutive times without medical  reason, if treatment goals not met, if there is a change in medical status, if patient makes no progress towards goals or if patient is discharged from hospital.  The above assessment, treatment plan, treatment alternatives and goals were discussed and mutually agreed upon: by patient  [place] 06/23/2019, 1:19 PM

## 2019-06-23 NOTE — Plan of Care (Signed)
  Problem: Consults Goal: RH STROKE PATIENT EDUCATION Description: See Patient Education module for education specifics  Outcome: Progressing Goal: Diabetes Guidelines if Diabetic/Glucose > 140 Description: If diabetic or lab glucose is > 140 mg/dl - Initiate Diabetes/Hyperglycemia Guidelines & Document Interventions  Outcome: Progressing   Problem: RH BOWEL ELIMINATION Goal: RH STG MANAGE BOWEL WITH ASSISTANCE Description: STG Manage Bowel with min Assistance. Outcome: Progressing Flowsheets (Taken 06/23/2019 1404) STG: Pt will manage bowels with assistance: 3-Moderate assistance Goal: RH STG MANAGE BOWEL W/MEDICATION W/ASSISTANCE Description: STG Manage Bowel with Medication with min Assistance. Outcome: Progressing Flowsheets (Taken 06/23/2019 1404) STG: Pt will manage bowels with medication with assistance: 3-Moderate assistance   Problem: RH SKIN INTEGRITY Goal: RH STG SKIN FREE OF INFECTION/BREAKDOWN Description: Patient will not have any break down while on CIR Outcome: Progressing Goal: RH STG MAINTAIN SKIN INTEGRITY WITH ASSISTANCE Description: STG Maintain Skin Integrity With min Assistance. Outcome: Progressing Flowsheets (Taken 06/23/2019 1404) STG: Maintain skin integrity with assistance: 3-Moderate assistance   Problem: RH SAFETY Goal: RH STG ADHERE TO SAFETY PRECAUTIONS W/ASSISTANCE/DEVICE Description: STG Adhere to Safety Precautions With min Assistance/Device. Outcome: Progressing Flowsheets (Taken 06/23/2019 1404) STG:Pt will adhere to safety precautions with assistance/device: 3-Moderate assistance   Problem: RH COGNITION-NURSING Goal: RH STG USES MEMORY AIDS/STRATEGIES W/ASSIST TO PROBLEM SOLVE Description: STG Uses Memory Aids/Strategies With min Assistance to Problem Solve. Outcome: Progressing Flowsheets (Taken 06/23/2019 1404) STG: Uses memory aids/strategies with assistance: 4-Minimal assistance   Problem: RH PAIN MANAGEMENT Goal: RH STG PAIN MANAGED AT OR  BELOW PT'S PAIN GOAL Description: Pain scale goal <3/10 Outcome: Progressing   Problem: RH KNOWLEDGE DEFICIT Goal: RH STG INCREASE KNOWLEDGE OF DIABETES Description: Patient will be able to verbalize steps in controlling blood sugar levels using handouts/resources and minimal assistance Outcome: Progressing Goal: RH STG INCREASE KNOWLEDGE OF HYPERTENSION Description: Patient will be able to verbalize the risks of hypertension using handouts and resources with minimal assistance Outcome: Progressing Goal: RH STG INCREASE KNOWLEDGE OF STROKE PROPHYLAXIS Description: Patient will be able to verbalize medications for stroke prophylaxis using handouts/resources with minimal assistance Outcome: Progressing   Problem: RH Vision Goal: RH LTG Vision (Specify) Outcome: Progressing

## 2019-06-23 NOTE — Evaluation (Signed)
Speech Language Pathology Assessment and Plan  Patient Details  Name: Sandra [name] MRN: [id number] Date of Birth: 1933-09-19  SLP Diagnosis: Cognitive Impairments  Rehab Potential: Good ELOS: 14-18 days    Today's Date: 06/23/2019 SLP Individual Time: 1405-1500 SLP Individual Time Calculation (min): 55 min   Problem List:  Patient Active Problem List   Diagnosis Date Noted  . Hypoalbuminemia due to protein-calorie malnutrition ([place])   . Macrocytosis   . Acute blood loss anemia   . FUO (fever of unknown origin)   . Diabetic retinopathy of left eye associated with type 2 diabetes mellitus ([place])   . Cognitive impairment 06/22/2019  . Lobar cerebral hemorrhage ([place]) 06/22/2019  . ICH (intracerebral hemorrhage) (HCC) - R occipital - HTN vs CAA 06/18/2019  . TIA (transient ischemic attack) 04/26/2019  . Diarrhea 04/24/2019  . Severe hypertension 03/18/2018  . Encephalopathy, hypertensive   . Acute encephalopathy 03/17/2018  . Scleroderma ([place]) 02/28/2016  . Dyspnea 02/10/2016  . Abnormal blood finding 02/10/2016  . CAD (coronary artery disease) 02/10/2016  . Osteoarthritis 02/10/2016  . Nodule on liver 12/06/2015  . ILD (interstitial lung disease) ([place]) 05/31/2015  . Type 2 diabetes mellitus with hyperglycemia ([place]) 03/11/2015  . Diabetes mellitus type 2 with retinopathy ([place]) 03/04/2015  . Vitamin D deficiency 10/01/2014  . Leukocytopenia 05/31/2014  . Chronic kidney disease (CKD), stage III (moderate) ([place]) 05/31/2014  . Fatigue 05/31/2014  . Lower abdominal pain 05/17/2014  . Nausea with vomiting 09/22/2013  . Chronic right SI joint pain 09/22/2013  . 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 Abrasion of ear canal 09/28/2012  . History of subarachnoid hemorrhage 04/07/2012  . Unspecified cerebral artery occlusion with cerebral infarction 04/07/2012  . UTI (urinary tract infection) 11/02/2011  . General medical examination 01/07/2011  . Chest pain 11/27/2010  . Achilles  tendon injury 11/27/2010  . Back pain 07/01/2010  . Breast pain 04/15/2010  . GAIT DISTURBANCE 08/07/2009  . Anxiety state 07/03/2009  . VALVULAR HEART DISEASE 05/23/2008  . NECK PAIN, CHRONIC 04/03/2008  . DEGENERATIVE JOINT DISEASE, CERVICAL SPINE 06/23/2006  . Osteoporosis 06/23/2006  . Hyperlipidemia associated with type 2 diabetes mellitus ([place]) 05/12/2006  . DEPRESSION 05/12/2006  . Essential hypertension 05/12/2006  . ALLERGIC RHINITIS 05/12/2006  . ACID REFLUX DISEASE 05/12/2006  . SCIATICA 05/12/2006   Past Medical History:  Past Medical History:  Diagnosis Date  . Abnormal blood finding 02/10/2016  . Abrasion of ear canal 09/28/2012  . Achilles tendon injury 11/27/2010  . ACID REFLUX DISEASE 05/12/2006   Qualifier: Diagnosis of  By: Larose Kells MD, [place] Acute MI ([facility])   . ALLERGIC RHINITIS 05/12/2006   Qualifier: Diagnosis of  By: Larose Kells MD, [place] Anxiety state 07/03/2009   Qualifier: Diagnosis of  By: Larose Kells MD, [place] pain 07/01/2010  . Chronic kidney disease (CKD), stage III (moderate) ([place]) 05/31/2014  . Chronic right SI joint pain 09/22/2013  . DEGENERATIVE JOINT DISEASE, CERVICAL SPINE 06/23/2006   Annotation: had a CAT scan with a cervical myelogram that show  left C6-7 and  C5-6 foraminal stenosis Qualifier: Diagnosis of  By: Larose Kells MD, [place] DEPRESSION 05/12/2006   Qualifier: Diagnosis of  By: Larose Kells MD, [place] DIABETES MELLITUS, TYPE II 05/12/2006   Now following w/ Endo at [facility] 04/27/2007   Qualifier: Diagnosis of  By: Jerold Coombe    .  Dyspnea 02/10/2016  . Encephalopathy, hypertensive   . Essential hypertension 05/12/2006   Qualifier: Diagnosis of  By: Larose Kells MD, [place] Fatigue 05/31/2014  . GAIT DISTURBANCE 08/07/2009   Qualifier: Diagnosis of  By: Larose Kells MD, [place] GERD (gastroesophageal reflux disease)   . Headache(784.0) 11/27/2010  . Hyperlipidemia   . Hyperlipidemia associated with type 2 diabetes mellitus  ([place]) 05/12/2006   Qualifier: Diagnosis of  By: Larose Kells MD, [place] ILD (interstitial lung disease) ([place]) 05/31/2015  . Leukocytopenia 05/31/2014  . Lower abdominal pain 05/17/2014  . Nausea with vomiting 09/22/2013  . NECK PAIN, CHRONIC 04/03/2008   Qualifier: Diagnosis of  By: Larose Kells MD, [place] Nodule on liver 12/06/2015  . Osteoarthritis 02/10/2016  . Osteopenia   . Osteoporosis 06/23/2006   Annotation: had a bone density test in 08-2004.  T score was -2.4 Qualifier: Diagnosis of  By: Larose Kells MD, [place] SAH (subarachnoid hemorrhage) ([place])   . Sciatica   . Scleroderma ([place]) 02/28/2016  . Severe hypertension 03/18/2018  . Slurred speech 11/27/2010  . Takotsubo cardiomyopathy    s/p stress MI with stress induced CM with normal coronary arteries by cath 2007 with normalization of LVF by echo 04/2005  . TIA (transient ischemic attack)   . Unspecified cerebral artery occlusion with cerebral infarction 04/07/2012  . UTI (urinary tract infection) 11/02/2011  . 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 Vitamin D deficiency 10/01/2014   Past Surgical History:  Past Surgical History:  Procedure Laterality Date  . ABDOMINAL HYSTERECTOMY  1977   no oophorectomy  . APPENDECTOMY    . CARDIAC CATHETERIZATION    . CATARACT EXTRACTION, BILATERAL  2011  . SPINAL FUSION  2000   Dr. Vertell Limber  . TONSILLECTOMY      Assessment / Plan / Recommendation Clinical Impression Patient is an19 year old right-handed female with history of prior hemorrhagic stroke/TIA, T2DMwith retinopathy, CKD (Dr. Blanche East), ILD (Dr. Vaughan Browner), chronic LBP, fall 06/06/19 with mild injuries and recurrent fall on 06/17/19 with reports of persistent HA and right elbow pain. She presented to theED that evening but left due to wait time. She was admitted on 06/18/2019 with reports of confusion, headaches, dizziness and difficulty walking. She was found to have left hemianopsia with mild neglect and CT of head done revealing focal hemorrhage in  middle occipital lobe likely from hemorrhagic infarct and mild cytotoxic edema with small vessel disease. MRI brain done revealing hematoma right posterior temporoparietal lobe containing methemoglobin and extension into subdural space with small SDH and evidence of prior microhemorrhages in left frontal and left parietal lobe likely due to hypertensive bleed. Neurology question hypertension versus trauma versus amyloid as cause of bleed. MRA was negative for stenosis, aneurysm or vascular malformation.She continues to have limitations due to headaches as well as chronic back pain, balance deficits, right gaze preference with left inattention and decreased awareness of deficits. CIR was recommended due to functional decline. Patient admitted 06/22/19.  Patient demonstrates mild-moderate cognitive impairments impacting attention, functional problem solving, attention to left field of environment, intellectual awareness and recall which impacts her safety with functional and familiar tasks. Patient's overall auditory comprehension and verbal expression appeared [facility] for all tasks assessed and patient was 100% intelligible at the conversation level. Patient would benefit from skilled SLP intervention to maximize her cognitive functioning and overall functional independence prior to discharge.      Skilled Therapeutic Interventions  Administered a cognitive-linguistic evaluation, please see above for details.   SLP Assessment  Patient will need skilled [place] Pathology Services during CIR admission    Recommendations  Oral Care Recommendations: Oral care BID Recommendations for Other Services: Neuropsych consult Patient destination: Home Follow up Recommendations: Home Health SLP;24 hour supervision/assistance Equipment Recommended: None recommended by SLP    SLP Frequency 3 to 5 out of 7 days   SLP Duration  SLP Intensity  SLP Treatment/Interventions 14-18 days  Minumum of 1-2  x/day, 30 to 90 minutes  Cognitive remediation/compensation;Internal/external aids;Therapeutic Activities;Environmental controls;Cueing hierarchy;Functional tasks;Patient/family education    Pain Pain Assessment Pain Scale: 0-10 Pain Score: 4  Pain Type: Chronic pain Pain Location: Back Pain Orientation: Lower Pain Descriptors / Indicators: Aching Pain Onset: On-going Patients Stated Pain Goal: 3 Pain Intervention(s): Repositioned Multiple Pain Sites: No  Prior Functioning Type of Home: House  Lives With: Other (Comment);Daughter(and adult grand-daughter) Available Help at Discharge: Family;Friend(s);Available 24 hours/day Vocation: Retired  Programmer, systems Overall Cognitive Status: Impaired/Different from baseline Arousal/Alertness: Awake/alert Orientation Level: Oriented X4 Attention: Sustained Focused Attention: Appears intact Sustained Attention: Impaired Sustained Attention Impairment: Verbal basic;Functional basic Memory: Impaired Memory Impairment: Decreased short term memory Decreased Long Term Memory: Functional basic;Verbal basic Immediate Memory Recall: Sock;Blue;Bed Memory Recall Sock: Without Cue Memory Recall Blue: Without Cue Memory Recall Bed: Without Cue Awareness: Impaired Awareness Impairment: Intellectual impairment Problem Solving: Impaired Problem Solving Impairment: Functional complex Behaviors: Perseveration Safety/Judgment: Impaired  Comprehension Auditory Comprehension Overall Auditory Comprehension: Appears within functional limits for tasks assessed Expression Expression Primary Mode of Expression: Verbal Verbal Expression Overall Verbal Expression: Appears within functional limits for tasks assessed Written Expression Dominant Hand: Right Oral Motor Oral Motor/Sensory Function Overall Oral Motor/Sensory Function: Within functional limits Motor Speech Overall Motor Speech: Appears within functional limits for tasks  assessed  Short Term Goals: Week 1: SLP Short Term Goal 1 (Week 1): Patient will demonstrate selective attention to functional tasks in a mildly distracting enviornment for 30 minutes with supervision verbal cues for redirection. SLP Short Term Goal 2 (Week 1): Patient will attend to left field of enviornment during functional tasks with Min verbal cues. SLP Short Term Goal 3 (Week 1): Patient will demonstrate functional problem solving for mildly complex and familiar tasks with Min verbal cues. SLP Short Term Goal 4 (Week 1): Patient will recall new, daily information with Min verbal and visual cues. SLP Short Term Goal 5 (Week 1): Patient will self-monitor and correct errors during functional tasks with Min verbal cues.  Refer to Care Plan for Long Term Goals  Recommendations for other services: Neuropsych  Discharge Criteria: Patient will be discharged from SLP if patient refuses treatment 3 consecutive times without medical reason, if treatment goals not met, if there is a change in medical status, if patient makes no progress towards goals or if patient is discharged from hospital.  The above assessment, treatment plan, treatment alternatives and goals were discussed and mutually agreed upon: by patient  Kendrick Haapala 06/23/2019, 3:13 PM

## 2019-06-23 NOTE — Progress Notes (Signed)
Inpatient Rehabilitation Care Coordinator Assessment and Plan  Patient Details  Name: Sandra [name] MRN: [id number] Date of Birth: 10-12-1933  Today's Date: 06/23/2019  Problem List:  Patient Active Problem List   Diagnosis Date Noted  . Hypoalbuminemia due to protein-calorie malnutrition ([place])   . Macrocytosis   . Acute blood loss anemia   . FUO (fever of unknown origin)   . Diabetic retinopathy of left eye associated with type 2 diabetes mellitus ([place])   . Cognitive impairment 06/22/2019  . Lobar cerebral hemorrhage ([place]) 06/22/2019  . ICH (intracerebral hemorrhage) (HCC) - R occipital - HTN vs CAA 06/18/2019  . TIA (transient ischemic attack) 04/26/2019  . Diarrhea 04/24/2019  . Severe hypertension 03/18/2018  . Encephalopathy, hypertensive   . Acute encephalopathy 03/17/2018  . Scleroderma ([place]) 02/28/2016  . Dyspnea 02/10/2016  . Abnormal blood finding 02/10/2016  . CAD (coronary artery disease) 02/10/2016  . Osteoarthritis 02/10/2016  . Nodule on liver 12/06/2015  . ILD (interstitial lung disease) ([place]) 05/31/2015  . Type 2 diabetes mellitus with hyperglycemia ([place]) 03/11/2015  . Diabetes mellitus type 2 with retinopathy ([place]) 03/04/2015  . Vitamin D deficiency 10/01/2014  . Leukocytopenia 05/31/2014  . Chronic kidney disease (CKD), stage III (moderate) ([place]) 05/31/2014  . Fatigue 05/31/2014  . Lower abdominal pain 05/17/2014  . Nausea with vomiting 09/22/2013  . Chronic right SI joint pain 09/22/2013  . 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 Abrasion of ear canal 09/28/2012  . History of subarachnoid hemorrhage 04/07/2012  . Unspecified cerebral artery occlusion with cerebral infarction 04/07/2012  . UTI (urinary tract infection) 11/02/2011  . General medical examination 01/07/2011  . Chest pain 11/27/2010  . Achilles tendon injury 11/27/2010  . Back pain 07/01/2010  . Breast pain 04/15/2010  . GAIT DISTURBANCE 08/07/2009  . Anxiety state 07/03/2009  . VALVULAR  HEART DISEASE 05/23/2008  . NECK PAIN, CHRONIC 04/03/2008  . DEGENERATIVE JOINT DISEASE, CERVICAL SPINE 06/23/2006  . Osteoporosis 06/23/2006  . Hyperlipidemia associated with type 2 diabetes mellitus ([place]) 05/12/2006  . DEPRESSION 05/12/2006  . Essential hypertension 05/12/2006  . ALLERGIC RHINITIS 05/12/2006  . ACID REFLUX DISEASE 05/12/2006  . SCIATICA 05/12/2006   Past Medical History:  Past Medical History:  Diagnosis Date  . Abnormal blood finding 02/10/2016  . Abrasion of ear canal 09/28/2012  . Achilles tendon injury 11/27/2010  . ACID REFLUX DISEASE 05/12/2006   Qualifier: Diagnosis of  By: Larose Kells MD, [place] Acute MI ([place])   . ALLERGIC RHINITIS 05/12/2006   Qualifier: Diagnosis of  By: Larose Kells MD, [place] Anxiety state 07/03/2009   Qualifier: Diagnosis of  By: Larose Kells MD, [place] pain 07/01/2010  . Chronic kidney disease (CKD), stage III (moderate) ([place]) 05/31/2014  . Chronic right SI joint pain 09/22/2013  . DEGENERATIVE JOINT DISEASE, CERVICAL SPINE 06/23/2006   Annotation: had a CAT scan with a cervical myelogram that show  left C6-7 and  C5-6 foraminal stenosis Qualifier: Diagnosis of  By: Larose Kells MD, [place] DEPRESSION 05/12/2006   Qualifier: Diagnosis of  By: Larose Kells MD, [place] DIABETES MELLITUS, TYPE II 05/12/2006   Now following w/ Endo at [place] 04/27/2007   Qualifier: Diagnosis of  By: Jerold Coombe    . Dyspnea 02/10/2016  . Encephalopathy, hypertensive   . Essential hypertension 05/12/2006   Qualifier: Diagnosis of  By: Larose Kells MD, [place]  Fatigue 05/31/2014  . GAIT DISTURBANCE 08/07/2009   Qualifier: Diagnosis of  By: Larose Kells MD, [place] GERD (gastroesophageal reflux disease)   . Headache(784.0) 11/27/2010  . Hyperlipidemia   . Hyperlipidemia associated with type 2 diabetes mellitus ([place]) 05/12/2006   Qualifier: Diagnosis of  By: Larose Kells MD, [place] ILD (interstitial lung disease) ([place]) 05/31/2015  . Leukocytopenia 05/31/2014  .  Lower abdominal pain 05/17/2014  . Nausea with vomiting 09/22/2013  . NECK PAIN, CHRONIC 04/03/2008   Qualifier: Diagnosis of  By: Larose Kells MD, [place] Nodule on liver 12/06/2015  . Osteoarthritis 02/10/2016  . Osteopenia   . Osteoporosis 06/23/2006   Annotation: had a bone density test in 08-2004.  T score was -2.4 Qualifier: Diagnosis of  By: Larose Kells MD, [place] SAH (subarachnoid hemorrhage) ([place])   . Sciatica   . Scleroderma ([place]) 02/28/2016  . Severe hypertension 03/18/2018  . Slurred speech 11/27/2010  . Takotsubo cardiomyopathy    s/p stress MI with stress induced CM with normal coronary arteries by cath 2007 with normalization of LVF by echo 04/2005  . TIA (transient ischemic attack)   . Unspecified cerebral artery occlusion with cerebral infarction 04/07/2012  . UTI (urinary tract infection) 11/02/2011  . 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 Vitamin D deficiency 10/01/2014   Past Surgical History:  Past Surgical History:  Procedure Laterality Date  . ABDOMINAL HYSTERECTOMY  1977   no oophorectomy  . APPENDECTOMY    . CARDIAC CATHETERIZATION    . CATARACT EXTRACTION, BILATERAL  2011  . SPINAL FUSION  2000   Dr. Vertell Limber  . TONSILLECTOMY     Social History:  reports that she is a non-smoker but has been exposed to tobacco smoke. She has never used smokeless tobacco. She reports current alcohol use of about 1.0 standard drinks of alcohol per week. She reports that she does not use drugs.  Family / Support Systems Marital Status: Divorced Children: 2 Children Other Supports: grand son POA Anticipated Caregiver: Daughter and granddaughter Ability/Limitations of Caregiver: none Caregiver Availability: 24/7  Social History Preferred language: English Religion: None Read: Yes Write: Yes   Abuse/Neglect Abuse/Neglect Assessment Can Be Completed: Yes Physical Abuse: Denies Verbal Abuse: Denies Sexual Abuse: Denies Exploitation of patient/patient's resources: Denies Self-Neglect:  Denies  Emotional Status Pt's affect, behavior and adjustment status: Patient reports being sad due to not being able to see her family Recent Psychosocial Issues: no Psychiatric History: no Substance Abuse History: no  Patient / Family Perceptions, Expectations & Goals Pt/Family understanding of illness & functional limitations: yes Pt/family expectations/goals: goal to discharge back home. 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  [employer]: None Premorbid Home Care/DME Agencies: None Transportation available at discharge: family able to transport Resource referrals recommended: Neuropsychology  Discharge Planning Living Arrangements: Children, Other relatives Support Systems: Children, Other (Comment)(Grandchildren) Type of Residence: Private residence(1 Step to enter, 1 step at back door) Insurance Resources: Multimedia programmer (specify)([employer]) Financial Screen Referred: No Living Expenses: Lives with family Money Management: Patient Does the patient have any problems obtaining your medications?: No Care Coordinator Anticipated Follow Up Needs: HH/OP Expected length of stay: 2-2.5 weeks  Clinical Impression Sw met patient introduced self. Explained role and processes SW will continue to follow up for questions and concerns.   [name] 06/23/2019, 1:51 PM

## 2019-06-23 NOTE — Progress Notes (Signed)
[facility] Individual Statement of Services  Patient Name:  Sandra [name]  Date:  06/23/2019  Welcome to the [facility].  Our goal is to provide you with an individualized program based on your diagnosis and situation, designed to meet your specific needs.  With this comprehensive rehabilitation program, you will be expected to participate in at least 3 hours of rehabilitation therapies Monday-Friday, with modified therapy programming on the weekends.  Your rehabilitation program will include the following services:  Physical Therapy (PT), Occupational Therapy (OT), Speech Therapy (ST), 24 hour per day rehabilitation nursing, Therapeutic Recreaction (TR), Neuropsychology, Care Coordinator, Rehabilitation Medicine, Nutrition Services and Pharmacy Services  Weekly team conferences will be held on Wednesdays to discuss your progress.  Your Inpatient Rehabilitation Care Coordinator will talk with you frequently to get your input and to update you on team discussions.  Team conferences with you and your family in attendance may also be held.  Expected length of stay: 2-2.5 Weeks  Overall anticipated outcome: Supervision to Min A  Depending on your progress and recovery, your program may change. Your Inpatient Rehabilitation Care Coordinator will coordinate services and will keep you informed of any changes. Your Inpatient Rehabilitation Care Coordinator's name and contact numbers are listed  below.  The following services may also be recommended but are not provided by the [facility]:    [place] will be made to provide these services after discharge if needed.  Arrangements include referral to agencies that provide these services.  Your insurance has been verified to be:  [employer] Your primary doctor is:  Terrill Mohr , MD  Pertinent  information will be shared with your doctor and your insurance company.  Inpatient Rehabilitation Care Coordinator:  Erlene Quan, [place] or [phone]  Information discussed with and copy given to patient by: [name], 06/23/2019, 10:28 AM

## 2019-06-23 NOTE — Evaluation (Signed)
Physical Therapy Assessment and Plan  Patient Details  Name: Sandra [name] MRN: [id number] Date of Birth: [birth year]  PT Diagnosis: Abnormal posture, Abnormality of gait, Ataxia, Coordination disorder, Difficulty walking, Hemiparesis non-dominant, Impaired cognition, Low back pain and Muscle weakness Rehab Potential: Good ELOS: 14-18 days   Today's Date: 06/23/2019 PT Individual Time: 2694-8546 PT Individual Time Calculation (min): 70 min    Problem List:  Patient Active Problem List   Diagnosis Date Noted  . Hypoalbuminemia due to protein-calorie malnutrition ([place])   . Macrocytosis   . Acute blood loss anemia   . FUO (fever of unknown origin)   . Diabetic retinopathy of left eye associated with type 2 diabetes mellitus ([place])   . Cognitive impairment 06/22/2019  . Lobar cerebral hemorrhage ([place]) 06/22/2019  . ICH (intracerebral hemorrhage) (HCC) - R occipital - HTN vs CAA 06/18/2019  . TIA (transient ischemic attack) 04/26/2019  . Diarrhea 04/24/2019  . Severe hypertension 03/18/2018  . Encephalopathy, hypertensive   . Acute encephalopathy 03/17/2018  . Scleroderma ([place]) 02/28/2016  . Dyspnea 02/10/2016  . Abnormal blood finding 02/10/2016  . CAD (coronary artery disease) 02/10/2016  . Osteoarthritis 02/10/2016  . Nodule on liver 12/06/2015  . ILD (interstitial lung disease) ([place]) 05/31/2015  . Type 2 diabetes mellitus with hyperglycemia ([place]) 03/11/2015  . Diabetes mellitus type 2 with retinopathy ([place]) 03/04/2015  . Vitamin D deficiency 10/01/2014  . Leukocytopenia 05/31/2014  . Chronic kidney disease (CKD), stage III (moderate) ([place]) 05/31/2014  . Fatigue 05/31/2014  . Lower abdominal pain 05/17/2014  . Nausea with vomiting 09/22/2013  . Chronic right SI joint pain 09/22/2013  . 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 Abrasion of ear canal 09/28/2012  . History of subarachnoid hemorrhage 04/07/2012  . Unspecified cerebral artery occlusion with cerebral infarction 04/07/2012   . UTI (urinary tract infection) 11/02/2011  . General medical examination 01/07/2011  . Chest pain 11/27/2010  . Achilles tendon injury 11/27/2010  . Back pain 07/01/2010  . Breast pain 04/15/2010  . GAIT DISTURBANCE 08/07/2009  . Anxiety state 07/03/2009  . VALVULAR HEART DISEASE 05/23/2008  . NECK PAIN, CHRONIC 04/03/2008  . DEGENERATIVE JOINT DISEASE, CERVICAL SPINE 06/23/2006  . Osteoporosis 06/23/2006  . Hyperlipidemia associated with type 2 diabetes mellitus ([place]) 05/12/2006  . DEPRESSION 05/12/2006  . Essential hypertension 05/12/2006  . ALLERGIC RHINITIS 05/12/2006  . ACID REFLUX DISEASE 05/12/2006  . SCIATICA 05/12/2006    Past Medical History:  Past Medical History:  Diagnosis Date  . Abnormal blood finding 02/10/2016  . Abrasion of ear canal 09/28/2012  . Achilles tendon injury 11/27/2010  . ACID REFLUX DISEASE 05/12/2006   Qualifier: Diagnosis of  By: Larose Kells MD, [place] Acute MI ([place])   . ALLERGIC RHINITIS 05/12/2006   Qualifier: Diagnosis of  By: Larose Kells MD, [place] Anxiety state 07/03/2009   Qualifier: Diagnosis of  By: Larose Kells MD, [place] pain 07/01/2010  . Chronic kidney disease (CKD), stage III (moderate) ([place]) 05/31/2014  . Chronic right SI joint pain 09/22/2013  . DEGENERATIVE JOINT DISEASE, CERVICAL SPINE 06/23/2006   Annotation: had a CAT scan with a cervical myelogram that show  left C6-7 and  C5-6 foraminal stenosis Qualifier: Diagnosis of  By: Larose Kells MD, [place] DEPRESSION 05/12/2006   Qualifier: Diagnosis of  By: Larose Kells MD, [place] DIABETES MELLITUS, TYPE II 05/12/2006   Now following w/ Endo at [facility]    .  DIABETIC  RETINOPATHY 04/27/2007   Qualifier: Diagnosis of  By: Jerold Coombe    . Dyspnea 02/10/2016  . Encephalopathy, hypertensive   . Essential hypertension 05/12/2006   Qualifier: Diagnosis of  By: Larose Kells MD, [place] Fatigue 05/31/2014  . GAIT DISTURBANCE 08/07/2009   Qualifier: Diagnosis of  By: Larose Kells MD, [place] GERD  (gastroesophageal reflux disease)   . Headache(784.0) 11/27/2010  . Hyperlipidemia   . Hyperlipidemia associated with type 2 diabetes mellitus ([place]) 05/12/2006   Qualifier: Diagnosis of  By: Larose Kells MD, [place] ILD (interstitial lung disease) ([place]) 05/31/2015  . Leukocytopenia 05/31/2014  . Lower abdominal pain 05/17/2014  . Nausea with vomiting 09/22/2013  . NECK PAIN, CHRONIC 04/03/2008   Qualifier: Diagnosis of  By: Larose Kells MD, [place] Nodule on liver 12/06/2015  . Osteoarthritis 02/10/2016  . Osteopenia   . Osteoporosis 06/23/2006   Annotation: had a bone density test in 08-2004.  T score was -2.4 Qualifier: Diagnosis of  By: Larose Kells MD, [place] SAH (subarachnoid hemorrhage) ([place])   . Sciatica   . Scleroderma ([place]) 02/28/2016  . Severe hypertension 03/18/2018  . Slurred speech 11/27/2010  . Takotsubo cardiomyopathy    s/p stress MI with stress induced CM with normal coronary arteries by cath 2007 with normalization of LVF by echo 04/2005  . TIA (transient ischemic attack)   . Unspecified cerebral artery occlusion with cerebral infarction 04/07/2012  . UTI (urinary tract infection) 11/02/2011  . 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 Vitamin D deficiency 10/01/2014   Past Surgical History:  Past Surgical History:  Procedure Laterality Date  . ABDOMINAL HYSTERECTOMY  1977   no oophorectomy  . APPENDECTOMY    . CARDIAC CATHETERIZATION    . CATARACT EXTRACTION, BILATERAL  2011  . SPINAL FUSION  2000   Dr. Vertell Limber  . TONSILLECTOMY      Assessment & Plan Clinical Impression: Sandra [name] is an55 year old right-handed female with history of prior hemorrhagic stroke/TIA, T2DMwith retinopathy, CKD (Dr. Blanche East), ILD (Dr. Vaughan Browner), chronic LBP, fall 06/06/19 with mild injuries and recurrent fall on 06/17/19 with reports of persistent HA and right elbow pain. She presented to theED that evening but left due to wait time. She was admitted on 06/18/2019 with reports of confusion, headaches, dizziness  and difficulty walking. She was found to have left hemianopsia with mild neglect and CT of head done revealing focal hemorrhage in middle occipital lobe likely from hemorrhagic infarct and mild cytotoxic edema with small vessel disease. MRI brain done revealing hematoma right posterior temporoparietal lobe containing methemoglobin and extension into subdural speech with small SDH and evidence of prior microhemorrhages in left frontal and left parietal lobe likely due to hypertensive bleed.  Neurology question hypertension versus trauma versus amyloid as cause of bleed. Aspirin was discontinued and Cleviprex added to keep SBP less than 140. 2D echo done revealing EF 60 to 65% with grade 1 diastolic dysfunction and mild to moderate aortic sclerosis. MRA was negative for stenosis, aneurysm or vascular malformation.Dr. Erlinda Hong felt that liver bleed without secondary cause and felt that she was a good candidate for SATURNtrial.She did develop fever 101.2 early am and UA ordered for work up.She continues to have limitations due to headaches as well as chronic back pain, balance deficits, right gaze preference with left inattention and decreased awareness of deficits. CIR was recommended due to functional decline   Patient transferred to  CIR on 06/22/2019 .   Patient currently requires Mod-MaxA mostly due to severe back pain this morning with mobility secondary to muscle weakness, decreased cardiorespiratoy endurance, motor apraxia, ataxia, decreased coordination and decreased motor planning, decreased visual perceptual skills and hemianopsia, decreased attention to left and decreased motor planning, decreased awareness, decreased problem solving, decreased safety awareness and delayed processing and decreased sitting balance, decreased standing balance and decreased balance strategies.  Prior to hospitalization, patient was independent  with mobility and lived with Other (Comment), Daughter(and adult  grand-daughter) in a House home.  Home access is 2Stairs to enter.  Patient will benefit from skilled PT intervention to maximize safe functional mobility, minimize fall risk and decrease caregiver burden for planned discharge home with 24 hour assist.  Anticipate patient will benefit from follow up [facility] at discharge.  PT - End of Session Activity Tolerance: Decreased this session Endurance Deficit: Yes PT Assessment Rehab Potential (ACUTE/IP ONLY): Good PT Barriers to Discharge: Home environment access/layout PT Barriers to Discharge Comments: 2 STE no rails PT Patient demonstrates impairments in the following area(s): Balance;Endurance;Motor;Pain;Perception;Safety PT Transfers Functional Problem(s): Bed Mobility;Bed to Chair;Car;Furniture PT Locomotion Functional Problem(s): Ambulation;Wheelchair Mobility;Stairs PT Plan PT Intensity: Minimum of 1-2 x/day ,45 to 90 minutes PT Frequency: 5 out of 7 days PT Duration Estimated Length of Stay: 14-18 days PT Treatment/Interventions: Ambulation/gait training;Discharge planning;Functional mobility training;Psychosocial support;Therapeutic Activities;Visual/perceptual remediation/compensation;Balance/vestibular training;Disease management/prevention;Neuromuscular re-education;Skin care/wound management;Therapeutic Exercise;Wheelchair propulsion/positioning;Cognitive remediation/compensation;DME/adaptive equipment instruction;Pain management;Splinting/orthotics;UE/LE Strength taining/ROM;Community reintegration;Functional electrical stimulation;Patient/family education;Stair training;UE/LE Coordination activities PT Transfers Anticipated Outcome(s): MinA, LRAD PT Locomotion Anticipated Outcome(s): MinA, LRAD PT Recommendation Follow Up Recommendations: Home health PT;24 hour supervision/assistance Patient destination: Home Equipment Recommended: To be determined  Skilled Therapeutic Intervention Patient received in bed, pleasant and willing to  attempt therapy this morning. Immediately had severe back pain when initiating mobility this morning, and this back pain limited today's evaluation by quite a bit- really only able to attempt bed mobility, functional transfers, and WC mobility due to such high pain levels. Required mod-maxA with all mobility due to pain. Able to tolerate WC navigation approximately 140f with S and BUEs today. Spent time adjusting WC leg rests so that her feet would be supported, which reduced back pain as well. [facility] and RN present and attending. Feel that once pain is reduced/managed she will do quite well with therapies moving forward.   PT Evaluation Precautions/Restrictions Precautions Precautions: Fall Precaution Comments: L hemianopsia, L inattention, apraxia, severe back pain (would use back precautions for comfort) Restrictions Weight Bearing Restrictions: No General Chart Reviewed: Yes Response to Previous Treatment: Not applicable Family/Caregiver Present: No Vital Signs Pain Pain Assessment Pain Scale: 0-10 Pain Score: 8  Pain Type: Chronic pain Pain Location: Back Pain Orientation: Lower Pain Descriptors / Indicators: Aching;Sore;Sharp Pain Onset: On-going Patients Stated Pain Goal: 3 Pain Intervention(s): Repositioned;RN made aware Multiple Pain Sites: No Home Living/Prior Functioning Home Living Available Help at Discharge: Family;Friend(s);Available 24 hours/day Type of Home: House Home Access: Stairs to enter ECenter[employer] Energyof Steps: 2 Entrance Stairs-Rails: None Home Layout: One level Bathroom Shower/Tub: TChiropodist Standard Bathroom Accessibility: Yes  Lives With: Other (Comment);Daughter(and adult grand-daughter) Prior Function Level of Independence: Independent with basic ADLs;Independent with gait;Independent with homemaking with ambulation;Independent with transfers  Able to Take Stairs?: Yes Driving:  Yes Vocation: Retired Comments: drives Vision/Perception  Vision - Assessment Eye Alignment: Within Functional Limits Ocular Range of Motion: Within Functional Limits Tracking/Visual Pursuits: Requires cues, head turns,  or add eye shifts to track;Impaired - to be further tested in functional context Additional Comments: often loses objects in her L visual field and turns her head instead or simply does not track; significnat peripheral vision impairments noted. L hemianopsia. Perception Perception: Impaired Praxis Praxis: Impaired  Cognition Overall Cognitive Status: Impaired/Different from baseline Arousal/Alertness: Awake/alert Orientation Level: Oriented X4 Attention: Sustained Focused Attention: Appears intact Sustained Attention: Impaired Awareness: Impaired Problem Solving: Impaired Behaviors: Perseveration Safety/Judgment: Impaired Sensation Sensation Light Touch: Appears Intact Hot/Cold: Not tested Proprioception: Impaired by gross assessment Stereognosis: Impaired by gross assessment Coordination Gross Motor Movements are Fluid and Coordinated: No Fine Motor Movements are Fluid and Coordinated: No Finger Nose Finger Test: significant ataxia noted in L UE>L LE Heel Shin Test: unable to truly assess due to back pain when attempting test, very low levels of ataxia noted in with foot tapping Motor  Motor Motor: Motor apraxia Motor - Skilled Clinical Observations: mild hemiparesis, L side sligntly weaker than R but not flaccid/significantly weaker  Mobility Bed Mobility Bed Mobility: Rolling Right;Rolling Left;Supine to Sit;Sit to Supine Rolling Right: Moderate Assistance - Patient 50-74% Rolling Left: Moderate Assistance - Patient 50-74% Supine to Sit: Maximal Assistance - Patient - Patient 25-49% Sit to Supine: Maximal Assistance - Patient 25-49% Transfers Transfers: Sit to Stand;Stand to Sit;Squat Pivot Transfers Sit to Stand: Maximal Assistance - Patient  25-49% Stand to Sit: Maximal Assistance - Patient 25-49% Squat Pivot Transfers: Maximal Assistance - Patient 25-49% Transfer (Assistive device): Rolling walker Locomotion  Gait Ambulation: No Gait Gait: No Stairs / Additional Locomotion Stairs: No Wheelchair Mobility Wheelchair Mobility: Yes Wheelchair Assistance: Chartered loss adjuster: Both upper extremities Wheelchair Parts Management: Needs assistance Distance: 131f  Trunk/Postural Assessment  Cervical Assessment Cervical Assessment: Exceptions to WFL(forward head) Thoracic Assessment Thoracic Assessment: Exceptions to WFL(kyphotic) Lumbar Assessment Lumbar Assessment: Exceptions to WFL(posteior pelvic tilt) Postural Control Postural Control: Deficits on evaluation  Balance Balance Balance Assessed: Yes Static Sitting Balance Static Sitting - Level of Assistance: 5: Stand by assistance Dynamic Sitting Balance Dynamic Sitting - Level of Assistance: 4: Min assist Static Standing Balance Static Standing - Level of Assistance: 4: Min assist Dynamic Standing Balance Dynamic Standing - Level of Assistance: 3: Mod assist Extremity Assessment  RUE Assessment RUE Assessment: Not tested LUE Assessment LUE Assessment: Not tested RLE Assessment RLE Assessment: Exceptions to WBlo[facility] D[facility] Center Of Excellence In SurgeryActive Range of Motion (AROM) Comments: functionally WNL General Strength Comments: ankle DF 4+/5, quad 4+/5, hip flexor 3/5 pain limited LLE Assessment LLE Assessment: Exceptions to WNationwide Children'S HospitalActive Range of Motion (AROM) Comments: functionally WNL General Strength Comments: anlke dorsiflexor 4/5, quad 4/5, unable to test hip flexor due to back pain    Refer to Care Plan for Long Term Goals  Recommendations for other services: Therapeutic Recreation  Pet therapy, Kitchen group, Stress management and Outing/community reintegration  Discharge Criteria: Patient will be discharged from PT if patient refuses treatment 3  consecutive times without medical reason, if treatment goals not met, if there is a change in medical status, if patient makes no progress towards goals or if patient is discharged from hospital.  The above assessment, treatment plan, treatment alternatives and goals were discussed and mutually agreed upon: by patient  KWindell Norfolk DPT, PN1   Supplemental Physical Therapist [place]   Pager [phone] Rehab Office [phone]

## 2019-06-23 NOTE — Significant Event (Signed)
Patient start shivering and having SOB at rest, vitals signs and BS were checked.PA was informed, chest xray and anxiety medicine was ordered .

## 2019-06-23 NOTE — Progress Notes (Signed)
Bilateral lower extremity venous duplex completed. Refer to "CV Proc" under chart review to view preliminary results.  06/23/2019 3:50 PM Kelby Aline., MHA, RVT, RDCS, RDMS

## 2019-06-23 NOTE — Progress Notes (Signed)
Inpatient Rehabilitation  Patient information reviewed and entered into eRehab system by Jenessa Gillingham M. Zafira Munos, M.A., CCC/SLP, PPS Coordinator.  Information including medical coding, functional ability and quality indicators will be reviewed and updated through discharge.    

## 2019-06-24 ENCOUNTER — Inpatient Hospital Stay (HOSPITAL_COMMUNITY): Payer: Medicare HMO | Admitting: Speech Pathology

## 2019-06-24 ENCOUNTER — Inpatient Hospital Stay (HOSPITAL_COMMUNITY): Payer: Medicare HMO | Admitting: Physical Therapy

## 2019-06-24 ENCOUNTER — Inpatient Hospital Stay (HOSPITAL_COMMUNITY): Payer: Medicare HMO | Admitting: Occupational Therapy

## 2019-06-24 DIAGNOSIS — E1165 Type 2 diabetes mellitus with hyperglycemia: Secondary | ICD-10-CM

## 2019-06-24 DIAGNOSIS — I10 Essential (primary) hypertension: Secondary | ICD-10-CM

## 2019-06-24 LAB — CBC WITH DIFFERENTIAL/PLATELET
Abs Immature Granulocytes: 0.03 10*3/uL (ref 0.00–0.07)
Basophils Absolute: 0 10*3/uL (ref 0.0–0.1)
Basophils Relative: 0 %
Eosinophils Absolute: 0.1 10*3/uL (ref 0.0–0.5)
Eosinophils Relative: 1 %
HCT: 28.6 % — ABNORMAL LOW (ref 36.0–46.0)
Hemoglobin: 9.6 g/dL — ABNORMAL LOW (ref 12.0–15.0)
Immature Granulocytes: 0 %
Lymphocytes Relative: 14 %
Lymphs Abs: 0.9 10*3/uL (ref 0.7–4.0)
MCH: 33.4 pg (ref 26.0–34.0)
MCHC: 33.6 g/dL (ref 30.0–36.0)
MCV: 99.7 fL (ref 80.0–100.0)
Monocytes Absolute: 0.6 10*3/uL (ref 0.1–1.0)
Monocytes Relative: 9 %
Neutro Abs: 5.1 10*3/uL (ref 1.7–7.7)
Neutrophils Relative %: 76 %
Platelets: 216 10*3/uL (ref 150–400)
RBC: 2.87 MIL/uL — ABNORMAL LOW (ref 3.87–5.11)
RDW: 11.5 % (ref 11.5–15.5)
WBC: 6.8 10*3/uL (ref 4.0–10.5)
nRBC: 0 % (ref 0.0–0.2)

## 2019-06-24 LAB — URINE CULTURE

## 2019-06-24 LAB — GLUCOSE, CAPILLARY
Glucose-Capillary: 187 mg/dL — ABNORMAL HIGH (ref 70–99)
Glucose-Capillary: 220 mg/dL — ABNORMAL HIGH (ref 70–99)
Glucose-Capillary: 196 mg/dL — ABNORMAL HIGH (ref 70–99)
Glucose-Capillary: 229 mg/dL — ABNORMAL HIGH (ref 70–99)

## 2019-06-24 MED ORDER — INSULIN DETEMIR 100 UNIT/ML ~~LOC~~ SOLN
13.0000 [IU] | Freq: Two times a day (BID) | SUBCUTANEOUS | Status: DC
  Administered 2019-06-24 – 2019-07-03 (×19): 13 [IU] via SUBCUTANEOUS
  Filled 2019-06-24 (×22): qty 0.13

## 2019-06-24 NOTE — Progress Notes (Signed)
Speech Language Pathology Daily Session Note  Patient Details  Name: Sandra [name] MRN: [id number] Date of Birth: 04-Apr-1933  Today's Date: 06/24/2019 SLP Individual Time: 0918-1000 SLP Individual Time Calculation (min): 42 min  Short Term Goals: Week 1: SLP Short Term Goal 1 (Week 1): Patient will demonstrate selective attention to functional tasks in a mildly distracting enviornment for 30 minutes with supervision verbal cues for redirection. SLP Short Term Goal 2 (Week 1): Patient will attend to left field of enviornment during functional tasks with Min verbal cues. SLP Short Term Goal 3 (Week 1): Patient will demonstrate functional problem solving for mildly complex and familiar tasks with Min verbal cues. SLP Short Term Goal 4 (Week 1): Patient will recall new, daily information with Min verbal and visual cues. SLP Short Term Goal 5 (Week 1): Patient will self-monitor and correct errors during functional tasks with Min verbal cues.  Skilled Therapeutic Interventions: Pt was seen for skilled ST targeting cognition. Upon arrival, pt expressed frustration regarding her room set up/feeling as though she had switched rooms. SLP provided feedback regarding change to CIR from acute care and required Mod A cues for redirection to functional tasks. Conversation also utilized to increase awareness of current deficits as well as hospital safety precautions (ex: need to call for assistance prior to getting out of bed). Pt also completed a basic medication management task with decreased Min A for sustained attention as well as problem solving throughout task. Pt left sitting in bed with alarm set and needs within reach. Continue per current plan of care.         Pain Pain Assessment Pain Scale: Faces Faces Pain Scale: No hurt   Therapy/Group: Individual Therapy  Arbutus Leas 06/24/2019, 9:18 AM

## 2019-06-24 NOTE — Plan of Care (Signed)
  Problem: Consults Goal: RH STROKE PATIENT EDUCATION Description: See Patient Education module for education specifics  Outcome: Progressing Goal: Diabetes Guidelines if Diabetic/Glucose > 140 Description: If diabetic or lab glucose is > 140 mg/dl - Initiate Diabetes/Hyperglycemia Guidelines & Document Interventions  Outcome: Progressing   Problem: RH BOWEL ELIMINATION Goal: RH STG MANAGE BOWEL WITH ASSISTANCE Description: STG Manage Bowel with min Assistance. Outcome: Progressing Goal: RH STG MANAGE BOWEL W/MEDICATION W/ASSISTANCE Description: STG Manage Bowel with Medication with min Assistance. Outcome: Progressing   Problem: RH SKIN INTEGRITY Goal: RH STG SKIN FREE OF INFECTION/BREAKDOWN Description: Patient will not have any break down while on CIR Outcome: Progressing Goal: RH STG MAINTAIN SKIN INTEGRITY WITH ASSISTANCE Description: STG Maintain Skin Integrity With min Assistance. Outcome: Progressing   Problem: RH SAFETY Goal: RH STG ADHERE TO SAFETY PRECAUTIONS W/ASSISTANCE/DEVICE Description: STG Adhere to Safety Precautions With min Assistance/Device. Outcome: Progressing   Problem: RH COGNITION-NURSING Goal: RH STG USES MEMORY AIDS/STRATEGIES W/ASSIST TO PROBLEM SOLVE Description: STG Uses Memory Aids/Strategies With min Assistance to Problem Solve. Outcome: Progressing   Problem: RH PAIN MANAGEMENT Goal: RH STG PAIN MANAGED AT OR BELOW PT'S PAIN GOAL Description: Pain scale goal <3/10 Outcome: Progressing   Problem: RH KNOWLEDGE DEFICIT Goal: RH STG INCREASE KNOWLEDGE OF DIABETES Description: Patient will be able to verbalize steps in controlling blood sugar levels using handouts/resources and minimal assistance Outcome: Progressing Goal: RH STG INCREASE KNOWLEDGE OF HYPERTENSION Description: Patient will be able to verbalize the risks of hypertension using handouts and resources with minimal assistance Outcome: Progressing Goal: RH STG INCREASE KNOWLEDGE OF  STROKE PROPHYLAXIS Description: Patient will be able to verbalize medications for stroke prophylaxis using handouts/resources with minimal assistance Outcome: Progressing   Problem: RH Vision Goal: RH LTG Vision (Specify) Outcome: Progressing

## 2019-06-24 NOTE — Progress Notes (Addendum)
[facility] PHYSICAL MEDICINE & REHABILITATION PROGRESS NOTE  Subjective/Complaints: Patient seen laying in bed this morning.  She states she slept well overnight, confirmed with sleep chart.  Patient confused.  She recalls having x-ray, but states that she was taken to a different room and worried about her belongings.  She also states that she did not have any therapy yesterday.  She states she had chills yesterday.  Chest x-ray was ordered.  ROS: Denies CP, shortness of breath, nausea, vomiting, diarrhea, fevers, chills.  Objective: Vital Signs: Blood pressure (!) 153/51, pulse 83, temperature 98.2 F (36.8 C), resp. rate 18, height 5' (1.524 m), weight 67.2 kg, SpO2 94 %. DG Chest 2 View  Result Date: 06/23/2019 CLINICAL DATA:  Short of breath, chills EXAM: CHEST - 2 VIEW COMPARISON:  06/18/2019 FINDINGS: Frontal and lateral views of the chest demonstrate an unremarkable cardiac silhouette. No airspace disease, effusion, or pneumothorax. No acute bony abnormalities. IMPRESSION: 1. No acute intrathoracic process. Electronically Signed   By: Randa Ngo M.D.   On: 06/23/2019 19:11   VAS [place] LOWER EXTREMITY VENOUS (DVT)  Result Date: 06/23/2019  Lower Venous DVTStudy Indications: Fever. Stroke.  Limitations: Body habitus, poor ultrasound/tissue interface and patient position. Unable to tolerate compression maneuvers. Significant tremor present. Comparison Study: No prior study Performing Technologist: Maudry Mayhew MHA, RDMS, RVT, RDCS  Examination Guidelines: A complete evaluation includes B-mode imaging, spectral Doppler, color Doppler, and power Doppler as needed of all accessible portions of each vessel. Bilateral testing is considered an integral part of a complete examination. Limited examinations for reoccurring indications may be performed as noted. The reflux portion of the exam is performed with the patient in reverse Trendelenburg.   +---------+---------------+---------+-----------+----------+--------------+ RIGHT    CompressibilityPhasicitySpontaneityPropertiesThrombus Aging +---------+---------------+---------+-----------+----------+--------------+ CFV      Full           Yes      Yes                                 +---------+---------------+---------+-----------+----------+--------------+ SFJ      Full                                                        +---------+---------------+---------+-----------+----------+--------------+ FV Prox  Full                                                        +---------+---------------+---------+-----------+----------+--------------+ FV Mid                           Yes                                 +---------+---------------+---------+-----------+----------+--------------+ FV Distal                        Yes                                 +---------+---------------+---------+-----------+----------+--------------+  PFV      Full                                                        +---------+---------------+---------+-----------+----------+--------------+ POP                     Yes      Yes                                 +---------+---------------+---------+-----------+----------+--------------+   Right Technical Findings: Not visualized segments include PTV/Peroneal veins.  +-------+---------------+---------+-----------+----------+--------------+ LEFT   CompressibilityPhasicitySpontaneityPropertiesThrombus Aging +-------+---------------+---------+-----------+----------+--------------+ CFV    Full           Yes      Yes                                 +-------+---------------+---------+-----------+----------+--------------+ SFJ    Full                                                        +-------+---------------+---------+-----------+----------+--------------+ FV Prox                        Yes                                  +-------+---------------+---------+-----------+----------+--------------+ PFV                            Yes                                 +-------+---------------+---------+-----------+----------+--------------+   Left Technical Findings: Not visualized segments include limited visualization femoral vein, PTV, peroneal veins. Popliteal vein not visualized.   Summary: RIGHT: - There is no obvious evidence of acute deep vein thrombosis in the lower extremity. However, portions of this examination were limited- see technologist comments above.  - No cystic structure found in the popliteal fossa.  LEFT: - There is no obvious evidence of acute deep vein thrombosis in the lower extremity. However, portions of this examination were limited- see technologist comments above.  - No cystic structure found in the popliteal fossa.  *See table(s) above for measurements and observations. Electronically signed by Ruta Hinds MD on 06/23/2019 at 7:38:27 PM.    Final    Recent Labs    06/23/19 0653 06/24/19 0806  WBC 9.1 6.8  HGB 10.3* 9.6*  HCT 30.8* 28.6*  PLT 215 216   Recent Labs    06/22/19 0343 06/23/19 0653  NA 134* 136  K 4.0 3.7  CL 106 105  CO2 19* 20*  GLUCOSE 197* 173*  BUN 17 15  CREATININE 1.30* 1.34*  CALCIUM 8.3* 8.8*    Physical Exam: BP (!) 153/51 (BP Location: Left Arm)   Pulse 83   Temp 98.2 F (36.8 C)   Resp 18  Ht 5' (1.524 m)   Wt 67.2 kg   SpO2 94%   BMI 28.93 kg/m  Constitutional: No distress . Vital signs reviewed. HENT: Normocephalic.  Atraumatic. Eyes: EOMI. No discharge. Cardiovascular: No JVD. Respiratory: Normal effort.  No stridor. GI: Non-distended. Skin: Warm and dry.  Intact. Psych: Normal mood.  Normal behavior. Musc: No edema in extremities.  No tenderness in extremities. Neurological: Alert and oriented Follows basic commands.  Left hemianopsia Motor: RUE/RLE: 5/5 proximal to distal LUE: 4+/5 proximal to distal LLE:  4-4+/5 proximal to distal, unchanged LUE ataxia  Assessment/Plan: 1. Functional deficits secondary to right occipital ICH which require 3+ hours per day of interdisciplinary therapy in a comprehensive inpatient rehab setting.  Physiatrist is providing close team supervision and 24 hour management of active medical problems listed below.  Physiatrist and rehab team continue to assess barriers to discharge/monitor patient progress toward functional and medical goals  Care Tool:  Bathing    Body parts bathed by patient: Right arm, Left arm, Chest, Abdomen, Right upper leg, Left upper leg, Face, Front perineal area   Body parts bathed by helper: Buttocks, Right lower leg, Left lower leg     Bathing assist Assist Level: Moderate Assistance - Patient 50 - 74%     Upper Body Dressing/Undressing Upper body dressing   What is the patient wearing?: Hospital gown only    Upper body assist Assist Level: Moderate Assistance - Patient 50 - 74%    Lower Body Dressing/Undressing Lower body dressing      What is the patient wearing?: Incontinence brief     Lower body assist Assist for lower body dressing: Maximal Assistance - Patient 25 - 49%     Toileting Toileting    Toileting assist Assist for toileting: 2 Helpers     Transfers Chair/bed transfer  Transfers assist     Chair/bed transfer assist level: Moderate Assistance - Patient 50 - 74%     Locomotion Ambulation   Ambulation assist   Ambulation activity did not occur: Safety/medical concerns(severe back pain)  Assist level: Minimal Assistance - Patient > 75% Assistive device: Walker-rolling Max distance: 47ft   Walk 10 feet activity   Assist  Walk 10 feet activity did not occur: Safety/medical concerns(severe back pain)  Assist level: Minimal Assistance - Patient > 75% Assistive device: Walker-rolling   Walk 50 feet activity   Assist Walk 50 feet with 2 turns activity did not occur: Safety/medical  concerns(severe back pain)         Walk 150 feet activity   Assist Walk 150 feet activity did not occur: Safety/medical concerns(severe back pain)         Walk 10 feet on uneven surface  activity   Assist Walk 10 feet on uneven surfaces activity did not occur: Safety/medical concerns(severe back pain)         Wheelchair     Assist Will patient use wheelchair at discharge?: (TBD) Type of Wheelchair: Manual    Wheelchair assist level: Contact Guard/Touching assist Max wheelchair distance: 124ft    Wheelchair 50 feet with 2 turns activity    Assist        Assist Level: Independent   Wheelchair 150 feet activity     Assist     Assist Level: Minimal Assistance - Patient > 75%      Medical Problem List and Plan: 1.Left sided weakness and visual-spatial, cognitive deficitssecondary to right occipital ICH (hypertensive vs amyloid angiopathy). Pt with some cognitive decline prior to  hospitalization as well.  Continue CIR2. Antithrombotics: -DVT/anticoagulation:Pharmaceutical:Lovenoxadded -antiplatelet therapy: N/A due to bleed. 3.Chronic LBP/Headaches/Pain Management: Tylenol prn 4. Mood:LCSW to follow for evaluation and support. -antipsychotic agents: N/a 5. Neuropsych: This patientis not fullycapable of making decisions on herown behalf. 6. Skin/Wound Care:Routine pressure relief measures. 7. Fluids/Electrolytes/Nutrition:Monitor I/O.  8. HTN: Monitor BP -continue metoprolol. Lisinopril increased to 20 mg on 06/01.   Elevated on 6/5, will consider medication adjustments tomorrow if persistently elevated  Monitor with increased mobility 9. T2DM with retinopathy: Hgb A1c-8.6. Was on Amaryl, [id number] am/6u pmand regular insulins--23/23/27at home. Will continue to monitor BS ac/hs.   Levemir bid increased to 13 twice daily on 6/5   Monitor with increased mobility 10. FUO: Afebrile x24  hours Encourage IS.   WBC is within normal notes Dopplers limited, but negative for DVT  CXR personally reviewed, unremarkable  UA unremarkable, Ucx multiple species 10. ABLA: Likely due to bleed.   Hb 9.6 on 6/5, labs ordered for Monday  Cont to monitor 11. Macrocytosis:  Vitamin B12 levels low on 6/4  Supplement initiated 12. CKD: Baseline SCr- 1.5-1.6.   Cr. 1.64 on 6/4, labs ordered for Monday  Cont to monitor 13. Interstitial lung disease:IS/OOB 14. Anxiety disorder:On Zoloft. Will continue Seroquel and Klonopin at bedtime. 15.Dyslipidemia: Statin being held due to bleed--resume at discharge. 16. Hypoalbuminemia  Supplement initiated on 6/4  LOS: 2 days A FACE TO FACE EVALUATION WAS PERFORMED  Kaydin Labo Lorie Phenix 06/24/2019, 2:53 PM

## 2019-06-24 NOTE — Progress Notes (Signed)
Physical Therapy Session Note  Patient Details  Name: Sandra [name] MRN: [id number] Date of Birth: 01-01-1934  Today's Date: 06/24/2019 PT Individual Time: 1941-7408 and 1448-1856 PT Individual Time Calculation (min): 31 min and 46 min  Short Term Goals: Week 1:  PT Short Term Goal 1 (Week 1): Patient will be able to perform all bed mobility with modA consistently PT Short Term Goal 2 (Week 1): Patient to require no more than ModA for functional transfers PT Short Term Goal 3 (Week 1): Patient to initiate gait training  Skilled Therapeutic Interventions/Progress Updates:    Session 1: Pt received supine, asleep in bed and upon awakening agreeable to therapy session. Pt noted to be using K-pad heat on her back. Supine>sitting L EOB, HOB flat but using bedrails, with max multimodal cuing for logroll technique due to back pain reported during yesterday's therapy session - mod assist for trunk upright. Sitting EOB using UE support as needed therapist donned pants max assist for time management. R stand pivot to w/c with mod assist for lifting to stand and min assist for balance while turning. Transported in/out bathroom in w/c. Stand pivot w/c<>toilet using grab bar with min assist for lifting and balance. Standing with min assist for balance performed LB clothing management with mod progressed to max assist with fatigue. Seated in w/c performed hand hygiene. Pt reports low back pain during toilet transfer reporting it is primarily on L side when in the past it is usually on the R. RN notified.  Transported to/from gym in w/c for time management and energy conservation. Gait training 42ft using RW with min assist for balance - demonstrates slow gait speed with decreased speed of movement. Transported back and left seated at nursing station with seat belt alarm on due to impulsivity and impaired safety awareness.  During session pt continues to demonstrate significant L inattention requiring max cuing to  orient to items on that side.  Session 2: Pt received sitting in w/c with her grandson present and pt agreeable to therapy session.  Transported to/from gym in w/c for time management and energy conservation. Pt continues to be confused when holding walker in w/c believing that she is steering the chair with the walker requiring assistance for safety. Sit<>stands using RW with min assist for lifting during session - requires max cuing for proper UE placement and safety with AD. Gait training ~29ft using RW with min assist for balance - pushes AD too far forward with increased trunk flexion requiring cuing for correction, decreased gait speed. Pt reports increased LBP on L side at end of walk, provided seated rest break. Ascended/descended 4 steps using B HRs with min assist - reciprocal pattern on ascent and step-to pattern leading with L LE on descent. Therapist retrieved youth height RW for improved fit. Ambulatory simulated car transfer (small SUV height) using RW with min assist for balance - pt with difficulty scooting hips over into the car. Standing balance using dynavision for 3 trails: 1st) attempted to perform without AD but pt has significant increase in LBP therefore used RW for next trials 2nd) in 60seconds able to get 2 lights then 3rd) in 60seconds able to get 6 lights - pt with significant L inattention requiring up to 30seconds to locate lights on L side despite min cuing. Transported back to room. Therapist educated pt/family on her CLOF, weekly conferences, and current impairments. Pt left seated in w/c with needs in reach, seat belt alarm on, and her grandson present.  Therapy Documentation Precautions:  Precautions Precautions: Fall Precaution Comments: L hemianopsia, L inattention, apraxia, severe back pain (would use back precautions for comfort) Restrictions Weight Bearing Restrictions: No  Pain: Session 1: Reports L side low back pain - pt using K-pad heat upon therapist  arrival - provided seated rest breaks as needed.   Session 2: Continues to report L sided low back pain that worsens with gait using RW or attempts at standing without UE support - provided seated rest breaks for pain management.   Therapy/Group: Individual Therapy  Tawana Scale, PT, DPT 06/24/2019, 7:55 AM

## 2019-06-24 NOTE — Progress Notes (Signed)
Occupational Therapy Session Note  Patient Details  Name: Sandra [name] MRN: [id number] Date of Birth: 11/14/1933  Today's Date: 06/24/2019 OT Individual Time: 8841-6606 OT Individual Time Calculation (min): 69 min    Short Term Goals: Week 1:  OT Short Term Goal 1 (Week 1): Pt will be able to sit to stand at toilet with CGA. OT Short Term Goal 2 (Week 1): Pt will complete stand pivot to toilet with CGA. OT Short Term Goal 3 (Week 1): Pt will demonstrate improved L side awareness by donning shirt with verbal cues only. OT Short Term Goal 4 (Week 1): Pt will be able to pull pants over hips with CGA for balance.  Skilled Therapeutic Interventions/Progress Updates:    Upon entering the room, pt seated in wheelchair with family member present in the room. Pt agreeable to OT intervention with encouragement and motivated to get hair washed. OT set pt up at sink and she dried and combed hair after therapist washed. OT assisted pt into bathroom with pt performing stand pivot transfer with use of grab bar and min A. Pt able to urinate but unable to have BM. Pt performs hygiene while seated and needing assistance with LB clothing management. Hand hygiene at sink with increased time and min cuing to locate needed items on L side of sink. OT assisted pt via wheelchair to day room with card task on table top. Pt with decreased dexterity in L hand while handling cards. Visual scanning task with max cuing and 20 minutes needed to locate 5 card matches on table. Pt verbalized increase pain in lower back and assisted back to room. Stand pivot transfer with mod A to bed with mod A for sit >supine. OT placing k pad on lower back and assisting with repositioning. Call bell and all needed items within reach with grandson present in room.   Therapy Documentation Precautions:  Precautions Precautions: Fall Precaution Comments: L hemianopsia, L inattention, apraxia, severe back pain (would use back precautions for  comfort) Restrictions Weight Bearing Restrictions: No  Pain: Pain Assessment Pain Scale: Faces Faces Pain Scale: No hurt ADL: ADL Eating: Set up Grooming: Setup Upper Body Bathing: Setup Where Assessed-Upper Body Bathing: Sitting at sink Lower Body Bathing: Moderate assistance Where Assessed-Lower Body Bathing: Sitting at sink Upper Body Dressing: Moderate assistance Where Assessed-Upper Body Dressing: Sitting at sink Lower Body Dressing: Moderate assistance Where Assessed-Lower Body Dressing: Sitting at sink Toileting: Maximal assistance Where Assessed-Toileting: Glass blower/designer: Moderate assistance Toilet Transfer Method: Stand pivot Toilet Transfer Equipment: Grab bars   Therapy/Group: Individual Therapy  Gypsy Decant 06/24/2019, 2:59 PM

## 2019-06-25 LAB — GLUCOSE, CAPILLARY
Glucose-Capillary: 166 mg/dL — ABNORMAL HIGH (ref 70–99)
Glucose-Capillary: 219 mg/dL — ABNORMAL HIGH (ref 70–99)
Glucose-Capillary: 315 mg/dL — ABNORMAL HIGH (ref 70–99)
Glucose-Capillary: 217 mg/dL — ABNORMAL HIGH (ref 70–99)

## 2019-06-25 MED ORDER — LISINOPRIL 20 MG PO TABS
30.0000 mg | ORAL_TABLET | Freq: Every day | ORAL | Status: DC
  Administered 2019-06-26 – 2019-07-03 (×8): 30 mg via ORAL
  Filled 2019-06-25 (×9): qty 1

## 2019-06-25 MED ORDER — ATORVASTATIN CALCIUM 40 MG PO TABS
40.0000 mg | ORAL_TABLET | Freq: Every day | ORAL | Status: DC
  Administered 2019-06-25 – 2019-07-05 (×11): 40 mg via ORAL
  Filled 2019-06-25 (×11): qty 1

## 2019-06-25 MED ORDER — LISINOPRIL 10 MG PO TABS
10.0000 mg | ORAL_TABLET | Freq: Once | ORAL | Status: AC
  Administered 2019-06-25: 10 mg via ORAL
  Filled 2019-06-25: qty 1

## 2019-06-25 NOTE — Progress Notes (Signed)
[place] PHYSICAL MEDICINE & REHABILITATION PROGRESS NOTE  Subjective/Complaints: Patient seen laying in bed this morning.  She states she slept well overnight.  She states therapies were "tiring" yesterday.  She is looking forward to a day of rest today.  ROS: Denies CP, SOB, N/V/D, fevers, chills.  Objective: Vital Signs: Blood pressure (!) 154/52, pulse 76, temperature 97.9 F (36.6 C), temperature source Oral, resp. rate 16, height 5' (1.524 m), weight 67.2 kg, SpO2 95 %. DG Chest 2 View  Result Date: 06/23/2019 CLINICAL DATA:  Short of breath, chills EXAM: CHEST - 2 VIEW COMPARISON:  06/18/2019 FINDINGS: Frontal and lateral views of the chest demonstrate an unremarkable cardiac silhouette. No airspace disease, effusion, or pneumothorax. No acute bony abnormalities. IMPRESSION: 1. No acute intrathoracic process. Electronically Signed   By: Randa Ngo M.D.   On: 06/23/2019 19:11   VAS [place] LOWER EXTREMITY VENOUS (DVT)  Result Date: 06/23/2019  Lower Venous DVTStudy Indications: Fever. Stroke.  Limitations: Body habitus, poor ultrasound/tissue interface and patient position. Unable to tolerate compression maneuvers. Significant tremor present. Comparison Study: No prior study Performing Technologist: Maudry Mayhew MHA, RDMS, RVT, RDCS  Examination Guidelines: A complete evaluation includes B-mode imaging, spectral Doppler, color Doppler, and power Doppler as needed of all accessible portions of each vessel. Bilateral testing is considered an integral part of a complete examination. Limited examinations for reoccurring indications may be performed as noted. The reflux portion of the exam is performed with the patient in reverse Trendelenburg.  +---------+---------------+---------+-----------+----------+--------------+ RIGHT    CompressibilityPhasicitySpontaneityPropertiesThrombus Aging +---------+---------------+---------+-----------+----------+--------------+ CFV      Full            Yes      Yes                                 +---------+---------------+---------+-----------+----------+--------------+ SFJ      Full                                                        +---------+---------------+---------+-----------+----------+--------------+ FV Prox  Full                                                        +---------+---------------+---------+-----------+----------+--------------+ FV Mid                           Yes                                 +---------+---------------+---------+-----------+----------+--------------+ FV Distal                        Yes                                 +---------+---------------+---------+-----------+----------+--------------+ PFV      Full                                                        +---------+---------------+---------+-----------+----------+--------------+  POP                     Yes      Yes                                 +---------+---------------+---------+-----------+----------+--------------+   Right Technical Findings: Not visualized segments include PTV/Peroneal veins.  +-------+---------------+---------+-----------+----------+--------------+ LEFT   CompressibilityPhasicitySpontaneityPropertiesThrombus Aging +-------+---------------+---------+-----------+----------+--------------+ CFV    Full           Yes      Yes                                 +-------+---------------+---------+-----------+----------+--------------+ SFJ    Full                                                        +-------+---------------+---------+-----------+----------+--------------+ FV Prox                        Yes                                 +-------+---------------+---------+-----------+----------+--------------+ PFV                            Yes                                 +-------+---------------+---------+-----------+----------+--------------+   Left  Technical Findings: Not visualized segments include limited visualization femoral vein, PTV, peroneal veins. Popliteal vein not visualized.   Summary: RIGHT: - There is no obvious evidence of acute deep vein thrombosis in the lower extremity. However, portions of this examination were limited- see technologist comments above.  - No cystic structure found in the popliteal fossa.  LEFT: - There is no obvious evidence of acute deep vein thrombosis in the lower extremity. However, portions of this examination were limited- see technologist comments above.  - No cystic structure found in the popliteal fossa.  *See table(s) above for measurements and observations. Electronically signed by Ruta Hinds MD on 06/23/2019 at 7:38:27 PM.    Final    Recent Labs    06/23/19 0653 06/24/19 0806  WBC 9.1 6.8  HGB 10.3* 9.6*  HCT 30.8* 28.6*  PLT 215 216   Recent Labs    06/23/19 0653  NA 136  K 3.7  CL 105  CO2 20*  GLUCOSE 173*  BUN 15  CREATININE 1.34*  CALCIUM 8.8*    Physical Exam: BP (!) 154/52 (BP Location: Left Arm)   Pulse 76   Temp 97.9 F (36.6 C) (Oral)   Resp 16   Ht 5' (1.524 m)   Wt 67.2 kg   SpO2 95%   BMI 28.93 kg/m  Constitutional: No distress . Vital signs reviewed. HENT: Normocephalic.  Atraumatic. Eyes: EOMI. No discharge. Cardiovascular: No JVD. Respiratory: Normal effort.  No stridor. GI: Non-distended. Skin: Warm and dry.  Intact. Psych: Normal mood.  Normal behavior. Musc: No edema in extremities.  No tenderness in extremities. Neurological: Alert Follows  basic commands.  Left hemianopsia, unchanged Motor: RUE/RLE: 5/5 proximal to distal LUE: 4+/5 proximal to distal LLE: 4-4+/5 proximal to distal, stable LUE ataxia  Assessment/Plan: 1. Functional deficits secondary to right occipital ICH which require 3+ hours per day of interdisciplinary therapy in a comprehensive inpatient rehab setting.  Physiatrist is providing close team supervision and 24 hour  management of active medical problems listed below.  Physiatrist and rehab team continue to assess barriers to discharge/monitor patient progress toward functional and medical goals  Care Tool:  Bathing    Body parts bathed by patient: Right arm, Left arm, Chest, Abdomen, Right upper leg, Left upper leg, Face, Front perineal area   Body parts bathed by helper: Buttocks, Right lower leg, Left lower leg     Bathing assist Assist Level: Moderate Assistance - Patient 50 - 74%     Upper Body Dressing/Undressing Upper body dressing   What is the patient wearing?: Hospital gown only    Upper body assist Assist Level: Moderate Assistance - Patient 50 - 74%    Lower Body Dressing/Undressing Lower body dressing      What is the patient wearing?: Incontinence brief     Lower body assist Assist for lower body dressing: Maximal Assistance - Patient 25 - 49%     Toileting Toileting    Toileting assist Assist for toileting: Moderate Assistance - Patient 50 - 74%     Transfers Chair/bed transfer  Transfers assist     Chair/bed transfer assist level: Moderate Assistance - Patient 50 - 74%     Locomotion Ambulation   Ambulation assist   Ambulation activity did not occur: Safety/medical concerns(severe back pain)  Assist level: Minimal Assistance - Patient > 75% Assistive device: Walker-rolling Max distance: 78ft   Walk 10 feet activity   Assist  Walk 10 feet activity did not occur: Safety/medical concerns(severe back pain)  Assist level: Minimal Assistance - Patient > 75% Assistive device: Walker-rolling   Walk 50 feet activity   Assist Walk 50 feet with 2 turns activity did not occur: Safety/medical concerns(severe back pain)         Walk 150 feet activity   Assist Walk 150 feet activity did not occur: Safety/medical concerns(severe back pain)         Walk 10 feet on uneven surface  activity   Assist Walk 10 feet on uneven surfaces activity did  not occur: Safety/medical concerns(severe back pain)         Wheelchair     Assist Will patient use wheelchair at discharge?: (TBD) Type of Wheelchair: Manual    Wheelchair assist level: Contact Guard/Touching assist Max wheelchair distance: 154ft    Wheelchair 50 feet with 2 turns activity    Assist        Assist Level: Independent   Wheelchair 150 feet activity     Assist     Assist Level: Minimal Assistance - Patient > 75%      Medical Problem List and Plan: 1.Left sided weakness and visual-spatial, cognitive deficitssecondary to right occipital ICH (hypertensive vs amyloid angiopathy). Pt with some cognitive decline prior to hospitalization as well.  Continue CIR 2. Antithrombotics: -DVT/anticoagulation:Pharmaceutical:Lovenoxadded -antiplatelet therapy: N/A due to bleed. 3.Chronic LBP/Headaches/Pain Management: Tylenol prn 4. Mood:LCSW to follow for evaluation and support. -antipsychotic agents: N/a 5. Neuropsych: This patientis not fullycapable of making decisions on herown behalf. 6. Skin/Wound Care:Routine pressure relief measures. 7. Fluids/Electrolytes/Nutrition:Monitor I/O.  8. HTN: Monitor BP -continue metoprolol.   Lisinopril increased to 20 mg  on 06/01, increased to 30 on 6/6  Monitor with increased mobility 9. T2DM with retinopathy: Hgb A1c-8.6. Was on Amaryl, NPH36u am/6u pmand regular insulins--23/23/27at home. Will continue to monitor BS ac/hs.   Levemir bid increased to 13 twice daily on 6/5  Elevated on 6/6, will not make further adjustments today given recent increase  Monitor with increased mobility 10. FUO: Afebrile x 48 hours Encourage IS.   WBC is within normal notes Dopplers limited, but negative for DVT  CXR personally reviewed, unremarkable  UA unremarkable, Ucx multiple species 10. ABLA: Likely due to bleed.   Hb 9.6 on 6/5, labs ordered for  tomorrow  Cont to monitor 11. Macrocytosis:  Vitamin B12 levels low on 6/4  Supplement initiated 12. CKD: Baseline SCr- 1.5-1.6.   Cr. 1.34 on 6/4, labs ordered for tomorrow  Cont to monitor 13. Interstitial lung disease:IS/OOB 14. Anxiety disorder:On Zoloft. Will continue Seroquel and Klonopin at bedtime. 15.Dyslipidemia: Statin being held due to bleed--resume at discharge. 16. Hypoalbuminemia  Supplement initiated on 6/4  LOS: 3 days A FACE TO FACE EVALUATION WAS PERFORMED  Aminah Zabawa Lorie Phenix 06/25/2019, 9:14 AM

## 2019-06-26 ENCOUNTER — Inpatient Hospital Stay (HOSPITAL_COMMUNITY): Payer: Medicare HMO | Admitting: Occupational Therapy

## 2019-06-26 ENCOUNTER — Inpatient Hospital Stay (HOSPITAL_COMMUNITY): Payer: Medicare HMO

## 2019-06-26 ENCOUNTER — Inpatient Hospital Stay (HOSPITAL_COMMUNITY): Payer: Medicare HMO | Admitting: Speech Pathology

## 2019-06-26 LAB — BASIC METABOLIC PANEL
Anion gap: 9 (ref 5–15)
BUN: 45 mg/dL — ABNORMAL HIGH (ref 8–23)
CO2: 22 mmol/L (ref 22–32)
Calcium: 8.9 mg/dL (ref 8.9–10.3)
Chloride: 106 mmol/L (ref 98–111)
Creatinine, Ser: 1.6 mg/dL — ABNORMAL HIGH (ref 0.44–1.00)
GFR calc Af Amer: 34 mL/min — ABNORMAL LOW (ref >60)
GFR calc non Af Amer: 29 mL/min — ABNORMAL LOW (ref >60)
Glucose, Bld: 263 mg/dL — ABNORMAL HIGH (ref 70–99)
Potassium: 3.9 mmol/L (ref 3.5–5.1)
Sodium: 137 mmol/L (ref 135–145)

## 2019-06-26 LAB — CBC
HCT: 30.7 % — ABNORMAL LOW (ref 36.0–46.0)
Hemoglobin: 10.1 g/dL — ABNORMAL LOW (ref 12.0–15.0)
MCH: 33.1 pg (ref 26.0–34.0)
MCHC: 32.9 g/dL (ref 30.0–36.0)
MCV: 100.7 fL — ABNORMAL HIGH (ref 80.0–100.0)
Platelets: 255 10*3/uL (ref 150–400)
RBC: 3.05 MIL/uL — ABNORMAL LOW (ref 3.87–5.11)
RDW: 11.3 % — ABNORMAL LOW (ref 11.5–15.5)
WBC: 4.4 10*3/uL (ref 4.0–10.5)
nRBC: 0 % (ref 0.0–0.2)

## 2019-06-26 LAB — GLUCOSE, CAPILLARY
Glucose-Capillary: 168 mg/dL — ABNORMAL HIGH (ref 70–99)
Glucose-Capillary: 291 mg/dL — ABNORMAL HIGH (ref 70–99)
Glucose-Capillary: 203 mg/dL — ABNORMAL HIGH (ref 70–99)
Glucose-Capillary: 167 mg/dL — ABNORMAL HIGH (ref 70–99)

## 2019-06-26 MED ORDER — TOPIRAMATE 25 MG PO TABS
25.0000 mg | ORAL_TABLET | Freq: Two times a day (BID) | ORAL | Status: DC
  Administered 2019-06-26 – 2019-07-05 (×19): 25 mg via ORAL
  Filled 2019-06-26 (×18): qty 1

## 2019-06-26 NOTE — Plan of Care (Signed)
  Problem: Consults Goal: RH STROKE PATIENT EDUCATION Description: See Patient Education module for education specifics  Outcome: Progressing Goal: Diabetes Guidelines if Diabetic/Glucose > 140 Description: If diabetic or lab glucose is > 140 mg/dl - Initiate Diabetes/Hyperglycemia Guidelines & Document Interventions  Outcome: Progressing   Problem: RH BOWEL ELIMINATION Goal: RH STG MANAGE BOWEL WITH ASSISTANCE Description: STG Manage Bowel with min Assistance. Outcome: Progressing Goal: RH STG MANAGE BOWEL W/MEDICATION W/ASSISTANCE Description: STG Manage Bowel with Medication with min Assistance. Outcome: Progressing   Problem: RH SKIN INTEGRITY Goal: RH STG SKIN FREE OF INFECTION/BREAKDOWN Description: Patient will not have any break down while on CIR Outcome: Progressing Goal: RH STG MAINTAIN SKIN INTEGRITY WITH ASSISTANCE Description: STG Maintain Skin Integrity With min Assistance. Outcome: Progressing   Problem: RH SAFETY Goal: RH STG ADHERE TO SAFETY PRECAUTIONS W/ASSISTANCE/DEVICE Description: STG Adhere to Safety Precautions With min Assistance/Device. Outcome: Progressing   Problem: RH COGNITION-NURSING Goal: RH STG USES MEMORY AIDS/STRATEGIES W/ASSIST TO PROBLEM SOLVE Description: STG Uses Memory Aids/Strategies With min Assistance to Problem Solve. Outcome: Progressing   Problem: RH PAIN MANAGEMENT Goal: RH STG PAIN MANAGED AT OR BELOW PT'S PAIN GOAL Description: Pain scale goal <3/10 Outcome: Progressing   Problem: RH KNOWLEDGE DEFICIT Goal: RH STG INCREASE KNOWLEDGE OF DIABETES Description: Patient will be able to verbalize steps in controlling blood sugar levels using handouts/resources and minimal assistance Outcome: Progressing Goal: RH STG INCREASE KNOWLEDGE OF HYPERTENSION Description: Patient will be able to verbalize the risks of hypertension using handouts and resources with minimal assistance Outcome: Progressing Goal: RH STG INCREASE KNOWLEDGE OF  STROKE PROPHYLAXIS Description: Patient will be able to verbalize medications for stroke prophylaxis using handouts/resources with minimal assistance Outcome: Progressing   Problem: RH Vision Goal: RH LTG Vision (Specify) Outcome: Progressing

## 2019-06-26 NOTE — Care Management (Signed)
Patient ID: Sandra [name], female   DOB: September 18, 1933, 86 y.o.   MRN: [id number] Met with patient to review CM role and collaboration wit SW Margreta Journey) for discharge plan. Reviewed secondary stroke prevention information in addition to books on HTN/Living well with DM given on Friday of last week. Reviewed DM manangement, carbohydrate counting.Pt reported she had been a diabetic since 2000 and felt that was controlled; except for new addition of insulin. Reports had trouble securing regular insulin PTA and felt that frustrated her and caused her BP to rise in addition to the statin may have "contributed to her stroke". Discussed medications and dietary means to manage HTN and HLD. Patient feels comfortable with insulin administration and has a supportive family to help if needed. Patient notes daughter is very attentive.

## 2019-06-26 NOTE — Progress Notes (Signed)
[facility] PHYSICAL MEDICINE & REHABILITATION PROGRESS NOTE  Subjective/Complaints:   No issues overnite  ROS: Denies CP, SOB, N/V/D, fevers, chills.  Objective: Vital Signs: Blood pressure (!) 143/48, pulse 65, temperature 97.8 F (36.6 C), temperature source Oral, resp. rate 16, height 5' (1.524 m), weight 67.2 kg, SpO2 95 %. No results found. Recent Labs    06/24/19 0806  WBC 6.8  HGB 9.6*  HCT 28.6*  PLT 216   No results for input(s): NA, K, CL, CO2, GLUCOSE, BUN, CREATININE, CALCIUM in the last 72 hours.  Physical Exam: BP (!) 143/48 (BP Location: Left Arm)   Pulse 65   Temp 97.8 F (36.6 C) (Oral)   Resp 16   Ht 5' (1.524 m)   Wt 67.2 kg   SpO2 95%   BMI 28.93 kg/m   Alert  General: No acute distress Mood and affect are appropriate Heart: Regular rate and rhythm no rubs murmurs or extra sounds Lungs: Clear to auscultation, breathing unlabored, no rales or wheezes Abdomen: Positive bowel sounds, soft nontender to palpation, nondistended Extremities: No clubbing, cyanosis, or edema Skin: No evidence of breakdown, no evidence of rash Neurologic: Cranial nerves II through XII intact, motor strength is 4+/5 in bilateral deltoid, bicep, tricep, grip, hip flexor, knee extensors, ankle dorsiflexor and plantar flexor Left homonymous hemianopsia   Musculoskeletal: Full range of motion in all 4 extremities. No joint swelling   Assessment/Plan: 1. Functional deficits secondary to right occipital ICH which require 3+ hours per day of interdisciplinary therapy in a comprehensive inpatient rehab setting.  Physiatrist is providing close team supervision and 24 hour management of active medical problems listed below.  Physiatrist and rehab team continue to assess barriers to discharge/monitor patient progress toward functional and medical goals  Care Tool:  Bathing    Body parts bathed by patient: Face, Left arm, Right arm   Body parts bathed by helper: Front  perineal area, Buttocks     Bathing assist Assist Level: Moderate Assistance - Patient 50 - 74%     Upper Body Dressing/Undressing Upper body dressing   What is the patient wearing?: Hospital gown only    Upper body assist Assist Level: Moderate Assistance - Patient 50 - 74%    Lower Body Dressing/Undressing Lower body dressing      What is the patient wearing?: Incontinence brief     Lower body assist Assist for lower body dressing: Maximal Assistance - Patient 25 - 49%     Toileting Toileting    Toileting assist Assist for toileting: Moderate Assistance - Patient 50 - 74%     Transfers Chair/bed transfer  Transfers assist     Chair/bed transfer assist level: Minimal Assistance - Patient > 75%     Locomotion Ambulation   Ambulation assist   Ambulation activity did not occur: Safety/medical concerns(severe back pain)  Assist level: Minimal Assistance - Patient > 75% Assistive device: Walker-rolling Max distance: 32ft   Walk 10 feet activity   Assist  Walk 10 feet activity did not occur: Safety/medical concerns(severe back pain)  Assist level: Minimal Assistance - Patient > 75% Assistive device: Walker-rolling   Walk 50 feet activity   Assist Walk 50 feet with 2 turns activity did not occur: Safety/medical concerns(severe back pain)         Walk 150 feet activity   Assist Walk 150 feet activity did not occur: Safety/medical concerns(severe back pain)         Walk 10 feet on uneven surface  activity   Assist Walk 10 feet on uneven surfaces activity did not occur: Safety/medical concerns(severe back pain)         Wheelchair     Assist Will patient use wheelchair at discharge?: (TBD) Type of Wheelchair: Manual    Wheelchair assist level: Contact Guard/Touching assist Max wheelchair distance: 133ft    Wheelchair 50 feet with 2 turns activity    Assist        Assist Level: Independent   Wheelchair 150 feet  activity     Assist     Assist Level: Minimal Assistance - Patient > 75%      Medical Problem List and Plan: 1.Left sided weakness and visual-spatial, cognitive deficitssecondary to right occipital ICH (hypertensive vs amyloid angiopathy). Pt with some cognitive decline prior to hospitalization as well.Right homonymous hemianopsia   Continue CIR PT, OT 2. Antithrombotics: -DVT/anticoagulation:Pharmaceutical:Lovenoxadded -antiplatelet therapy: N/A due to bleed. 3.Chronic LBP/Headaches/Pain Management: Tylenol prn Vascular HA post ICH, add topiramate 4. Mood:LCSW to follow for evaluation and support. -antipsychotic agents: N/a 5. Neuropsych: This patientis not fullycapable of making decisions on herown behalf. 6. Skin/Wound Care:Routine pressure relief measures. 7. Fluids/Electrolytes/Nutrition:Monitor I/O.  8. HTN: Monitor BP -continue metoprolol.   Lisinopril increased to 20 mg on 06/01, increased to 30 on 6/6  Monitor with increased mobility Vitals:   06/25/19 1948 06/26/19 0423  BP: (!) 172/51 (!) 143/48  Pulse: 68 65  Resp: 18 16  Temp: 98.2 F (36.8 C) 97.8 F (36.6 C)  SpO2: 95% 95%   9. T2DM with retinopathy: Hgb A1c-8.6. Was on Amaryl, NPH36u am/6u pmand regular insulins--23/23/27at home. Will continue to monitor BS ac/hs.   Levemir bid increased to 13 twice daily on 6/5 CBG (last 3)  Recent Labs    06/25/19 1633 06/25/19 2104 06/26/19 0554  GLUCAP 315* 217* 168*  labile, add amaryl    10. FUO: Afebrile x 48 hours Encourage IS.   WBC is within normal notes Dopplers limited, but negative for DVT  CXR personally reviewed, unremarkable  UA unremarkable, Ucx multiple species 10. ABLA: Likely due to bleed.   Hb 9.6 on 6/5, labs ordered for tomorrow  Cont to monitor 11. Macrocytosis:  Vitamin B12 levels low on 6/4  Supplement initiated 12. CKD: Baseline SCr- 1.5-1.6.   Cr. 1.34 on 6/4,  labs ordered for tomorrow  Cont to monitor 13. Interstitial lung disease:IS/OOB 14. Anxiety disorder:On Zoloft. Will continue Seroquel and Klonopin at bedtime. 15.Dyslipidemia: Statin being held due to bleed--resume at discharge. 16. Hypoalbuminemia  Supplement initiated on 6/4  LOS: 4 days A FACE TO FACE EVALUATION WAS PERFORMED  Charlett Blake 06/26/2019, 8:22 AM

## 2019-06-26 NOTE — Progress Notes (Signed)
Occupational Therapy Session Note  Patient Details  Name: Sandra [name] MRN: [id number] Date of Birth: [birth year]  Today's Date: 06/26/2019 OT Individual Time: 2248-2500 OT Individual Time Calculation (min): 60 min    Short Term Goals: Week 1:  OT Short Term Goal 1 (Week 1): Pt will be able to sit to stand at toilet with CGA. OT Short Term Goal 2 (Week 1): Pt will complete stand pivot to toilet with CGA. OT Short Term Goal 3 (Week 1): Pt will demonstrate improved L side awareness by donning shirt with verbal cues only. OT Short Term Goal 4 (Week 1): Pt will be able to pull pants over hips with CGA for balance.  Skilled Therapeutic Interventions/Progress Updates:  "Im so glad you came, Im ready to wash up".  Pt with no complaints of pain throughout session.  Pt completed supine to sit with min assist and SPT EOB to w/c using RW with min assist for AD management.  Pt bathed sitting sinkside including UB with min assist to wash back and LB with CGA during standing to wash peri area.  ADL items placed on left side to facilitate attention and pt picked out desired clothing and pt completed without difficulty.  Pt donned shirt with setup.  Pt had difficulty orienting underwear front and back needing assist for problem solving.  Pt donned underwear and pants with min assist to thread over feet and CGA during standing to pull over hips. Min assist to initiate threading of socks over toes and pt pulling up the rest of the way.  Pt requesting to donn shoes in long sitting in bed because this is how she does it at home.  Pt completed SPT w/c to EOB with CGA.  Pt needing max assist to donn shoes in this position.  Pt may benefit from sitting on lower surface to donn shoes due to current w/c does not allow BLE to touch floor. Pt with mild distractibility today needing intermittent redirection to tasks throughout session. Pt returned to sitting up in w/c with CGA, with SPT arrival.     Therapy  Documentation Precautions:  Precautions Precautions: Fall Precaution Comments: L hemianopsia, L inattention, apraxia, severe back pain (would use back precautions for comfort) Restrictions Weight Bearing Restrictions: No   Pain: Pain Assessment Pain Scale: 0-10 Pain Score: 0-No pain ADL: ADL Eating: Set up Grooming: Setup Upper Body Bathing: Setup Where Assessed-Upper Body Bathing: Sitting at sink Lower Body Bathing: Moderate assistance Where Assessed-Lower Body Bathing: Sitting at sink Upper Body Dressing: Moderate assistance Where Assessed-Upper Body Dressing: Sitting at sink Lower Body Dressing: Moderate assistance Where Assessed-Lower Body Dressing: Sitting at sink Toileting: Maximal assistance Where Assessed-Toileting: Glass blower/designer: Moderate assistance Toilet Transfer Method: Stand pivot Toilet Transfer Equipment: Grab bars   Therapy/Group: Individual Therapy  Ezekiel Slocumb 06/26/2019, 1:16 PM

## 2019-06-26 NOTE — Progress Notes (Signed)
Speech Language Pathology Daily Session Note  Patient Details  Name: Vee Bahe MRN: [id number] Date of Birth: 10/21/1933  Today's Date: 06/26/2019 SLP Individual Time: 1002-1100 SLP Individual Time Calculation (min): 58 min  Short Term Goals: Week 1: SLP Short Term Goal 1 (Week 1): Patient will demonstrate selective attention to functional tasks in a mildly distracting enviornment for 30 minutes with supervision verbal cues for redirection. SLP Short Term Goal 2 (Week 1): Patient will attend to left field of enviornment during functional tasks with Min verbal cues. SLP Short Term Goal 3 (Week 1): Patient will demonstrate functional problem solving for mildly complex and familiar tasks with Min verbal cues. SLP Short Term Goal 4 (Week 1): Patient will recall new, daily information with Min verbal and visual cues. SLP Short Term Goal 5 (Week 1): Patient will self-monitor and correct errors during functional tasks with Min verbal cues.  Skilled Therapeutic Interventions: Pt was seen for skilled ST targeting cognition. Provided Min A verbal cues, pt recalled events of last ST session with 90% accuracy. During a basic money management activity, pt required Min A verbal and visual cues for problem solving and error awareness, as well as recall. Visual/written supports aided in working memory throughout task, with no further cues required. 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 Emotional support and education regarding cognitive changes post-CVA. Pt left sitting in chair with alarm set and needs within reach. Continue per current plan of care.         Pain Pain Assessment Pain Scale: 0-10 Pain Score: 0-No pain  Therapy/Group: Individual Therapy  Arbutus Leas 06/26/2019, 7:09 AM

## 2019-06-26 NOTE — Progress Notes (Signed)
Physical Therapy Session Note  Patient Details  Name: Sandra [name] MRN: [id number] Date of Birth: 24-Sep-1933  Today's Date: 06/26/2019 PT Individual Time: 1300-1415 PT Individual Time Calculation (min): 75 min   Short Term Goals: Week 1:  PT Short Term Goal 1 (Week 1): Patient will be able to perform all bed mobility with modA consistently PT Short Term Goal 2 (Week 1): Patient to require no more than ModA for functional transfers PT Short Term Goal 3 (Week 1): Patient to initiate gait training Week 2:    Week 3:     Skilled Therapeutic Interventions/Progress Updates:    PAIN initially denies pain but back pain developed and progressed w/fatigue Rest breaks as needed, repositioned, treatment to tolerance.  Pt initially supine and sleeping but easily awakened.  Noted pt ate very little lunch.  Pt became tearful stating that she stopped eating bc she was told her blood sugar was a bit high and she felt like she couldn't "do anything right, not even eat".  Support and encouragement provided and pt easily redirected to PT session.  Supine to sit w/hob elevated w/min assist.  Static sit w/supervision, shoes donned by therapist w/max assist for time efficiency.  SPT to wc w/RW and cga. Pt transported to gym for continued session. STS w/rw w/cga, gait 270ft w/RW and cga, cues for safety, mildly decreased cadence, no c/o back pain.  Pt rested several min in sitting.  Standing activity to address balance, UE coordination, and cognitive challenge - egg crate puzzle w/increased difficulty w/row on patients L vs row on R (r performed first so may be due to fatigue), encouraged to utilize LUE though prefence of pt to use RUE to place and manipulate "eggs".  Stood x 5 min w/cga, c/o increased back pain, returned to sitting and completed puzzle w/mod assist in sitting.  Attention to task and problem solving decreased w/fatigue.  Gait 155ft w/RW and cga, cues to stay safe distance within walker.  Cues  for safety w/turn/sit to wc.  Pt transported back to room at end of session.  Pt requested to remain oob in wc.   Pt left oob in wc w/alarm belt set and needs in reach.  NT sitting outside room and agreed to supervise pt and assist to bed if any issues.  Therapy Documentation Precautions:  Precautions Precautions: Fall Precaution Comments: L hemianopsia, L inattention, apraxia, severe back pain (would use back precautions for comfort) Restrictions Weight Bearing Restrictions: No    Therapy/Group: Individual Therapy  Sandra [name], PT   Jerrilyn Cairo 06/26/2019, 4:27 PM

## 2019-06-27 ENCOUNTER — Inpatient Hospital Stay (HOSPITAL_COMMUNITY): Payer: Medicare HMO

## 2019-06-27 ENCOUNTER — Encounter (HOSPITAL_COMMUNITY): Payer: Medicare HMO | Admitting: Psychology

## 2019-06-27 ENCOUNTER — Inpatient Hospital Stay (HOSPITAL_COMMUNITY): Payer: Medicare HMO | Admitting: Occupational Therapy

## 2019-06-27 ENCOUNTER — Inpatient Hospital Stay (HOSPITAL_COMMUNITY): Payer: Medicare HMO | Admitting: Speech Pathology

## 2019-06-27 DIAGNOSIS — F411 Generalized anxiety disorder: Secondary | ICD-10-CM

## 2019-06-27 DIAGNOSIS — N179 Acute kidney failure, unspecified: Secondary | ICD-10-CM

## 2019-06-27 LAB — GLUCOSE, CAPILLARY
Glucose-Capillary: 187 mg/dL — ABNORMAL HIGH (ref 70–99)
Glucose-Capillary: 251 mg/dL — ABNORMAL HIGH (ref 70–99)
Glucose-Capillary: 138 mg/dL — ABNORMAL HIGH (ref 70–99)
Glucose-Capillary: 196 mg/dL — ABNORMAL HIGH (ref 70–99)

## 2019-06-27 MED ORDER — GLIMEPIRIDE 2 MG PO TABS
1.0000 mg | ORAL_TABLET | Freq: Every day | ORAL | Status: DC
  Administered 2019-06-27 – 2019-06-29 (×3): 1 mg via ORAL
  Filled 2019-06-27 (×4): qty 1

## 2019-06-27 NOTE — Progress Notes (Addendum)
Physical Therapy Session Note  Patient Details  Name: Sandra [name] MRN: [id number] Date of Birth: [birth year]  Today's Date: 06/27/2019 PT Individual Time: 0900-1000 PT Individual Time Calculation (min): 60 min   Short Term Goals: Week 1:  PT Short Term Goal 1 (Week 1): Patient will be able to perform all bed mobility with modA consistently PT Short Term Goal 2 (Week 1): Patient to require no more than ModA for functional transfers PT Short Term Goal 3 (Week 1): Patient to initiate gait training Week 2:    Week 3:     Skilled Therapeutic Interventions/Progress Updates:    PAIN at rest 2/10, w/activity 3-4/10, rest as needed in supported sitting, treatment to tolerance.  Pt initially sleeping but easily awakened.  Supine to side to sit w/cues for sequencing and use of rails w/hob elevated, min assist.   Pt requires assist to donn shoes/total assist to avoid back pain w/flexion. STS w/cga w/RW and gait 96ft including negotiation of doorway transition w/cga Commode transfer w/CGA, pt continent of urine and supervision for hygiene.  Repeated gait to wc as above back to sink. Pt stood at sink w/RW and combed hair w/supervision only. Stand to sit w/supervision in wc  . Pt then transported to gym for continued session.  Pt sts from wc to rw w/cga.   Gait > 350ft w/cga to close supervision including turning, no significant gait deviations and mildly slowed but acceptable cadence.  Pt then performed standing balance/dual UE/problem solving task of performing pipe puzzle using illustration/instruction.  Pt required mod assist today w/this task for problem solving/completion of puzzle.  Encouraged to use bilat UEs w/task/prefernce for RUE. Stood greater than 7 min w/cga only. Pt rests in sitting for several min due to fatigue.  STS from wc to rw w/cga and gait >126ft including turning w/close supervision, transfers to wc w/cues for safety w/RW.  Discussed dc planning/equipment needs.  Pt  requested to be assessed w/rollator due to benefits of seated rest w/community ambulation in setting of chronic back pain.  Agreed to address in upcoming session.  Pt transported back to room and handed over to NT for set up in room.  Therapy Documentation Precautions:  Precautions Precautions: Fall Precaution Comments: L hemianopsia, L inattention, apraxia, severe back pain (would use back precautions for comfort) Restrictions Weight Bearing Restrictions: No    Therapy/Group: Individual Therapy  Callie Fielding, PT   Jerrilyn Cairo 06/27/2019, 12:37 PM

## 2019-06-27 NOTE — Progress Notes (Signed)
[facility] PHYSICAL MEDICINE & REHABILITATION PROGRESS NOTE  Subjective/Complaints: Patient seen laying in bed this morning.  She states she slept well overnight.  She states she is doing well with therapies.  ROS: Denies CP, SOB, N/V/D  Objective: Vital Signs: Blood pressure (!) 142/46, pulse 70, temperature 97.8 F (36.6 C), resp. rate 20, height 5' (1.524 m), weight 68.5 kg, SpO2 95 %. No results found. Recent Labs    06/26/19 0750  WBC 4.4  HGB 10.1*  HCT 30.7*  PLT 255   Recent Labs    06/26/19 0750  NA 137  K 3.9  CL 106  CO2 22  GLUCOSE 263*  BUN 45*  CREATININE 1.60*  CALCIUM 8.9    Physical Exam: BP (!) 142/46 (BP Location: Left Arm)   Pulse 70   Temp 97.8 F (36.6 C)   Resp 20   Ht 5' (1.524 m)   Wt 68.5 kg   SpO2 95%   BMI 29.49 kg/m  Constitutional: No distress . Vital signs reviewed. HENT: Normocephalic.  Atraumatic. Eyes: EOMI. No discharge. Cardiovascular: No JVD. Respiratory: Normal effort.  No stridor. GI: Non-distended. Skin: Warm and dry.  Intact. Psych: Normal mood.  Normal behavior. Musc: No edema in extremities.  No tenderness in extremities. Neurologic: Alert Motor: RUE/RLE: 5/5 proximal distal LUE: 4+-5/5 proximal distal LLE: 4+-5/5 proximal distal Left homonymous hemianopsia  Left upper extremity dysmetria  Assessment/Plan: 1. Functional deficits secondary to right occipital ICH which require 3+ hours per day of interdisciplinary therapy in a comprehensive inpatient rehab setting.  Physiatrist is providing close team supervision and 24 hour management of active medical problems listed below.  Physiatrist and rehab team continue to assess barriers to discharge/monitor patient progress toward functional and medical goals  Care Tool:  Bathing    Body parts bathed by patient: Right arm, Left arm, Chest, Abdomen, Front perineal area, Buttocks, Right upper leg, Left upper leg, Right lower leg, Left lower leg, Face   Body  parts bathed by helper: Front perineal area, Buttocks     Bathing assist Assist Level: Contact Guard/Touching assist     Upper Body Dressing/Undressing Upper body dressing   What is the patient wearing?: Pull over shirt    Upper body assist Assist Level: Set up assist    Lower Body Dressing/Undressing Lower body dressing      What is the patient wearing?: Underwear/pull up, Pants     Lower body assist Assist for lower body dressing: Minimal Assistance - Patient > 75%     Toileting Toileting    Toileting assist Assist for toileting: Moderate Assistance - Patient 50 - 74%     Transfers Chair/bed transfer  Transfers assist     Chair/bed transfer assist level: Contact Guard/Touching assist     Locomotion Ambulation   Ambulation assist   Ambulation activity did not occur: Safety/medical concerns(severe back pain)  Assist level: Contact Guard/Touching assist Assistive device: Walker-rolling Max distance: 260   Walk 10 feet activity   Assist  Walk 10 feet activity did not occur: Safety/medical concerns(severe back pain)  Assist level: Contact Guard/Touching assist Assistive device: Walker-rolling   Walk 50 feet activity   Assist Walk 50 feet with 2 turns activity did not occur: Safety/medical concerns(severe back pain)  Assist level: Contact Guard/Touching assist Assistive device: Walker-rolling    Walk 150 feet activity   Assist Walk 150 feet activity did not occur: Safety/medical concerns(severe back pain)  Assist level: Contact Guard/Touching assist Assistive device: Walker-rolling  Walk 10 feet on uneven surface  activity   Assist Walk 10 feet on uneven surfaces activity did not occur: Safety/medical concerns(severe back pain)         Wheelchair     Assist Will patient use wheelchair at discharge?: (TBD) Type of Wheelchair: Manual    Wheelchair assist level: Contact Guard/Touching assist Max wheelchair distance: 113ft     Wheelchair 50 feet with 2 turns activity    Assist        Assist Level: Independent   Wheelchair 150 feet activity     Assist     Assist Level: Minimal Assistance - Patient > 75%      Medical Problem List and Plan: 1.Left sided weakness and visual-spatial, cognitive deficitssecondary to right occipital ICH (hypertensive vs amyloid angiopathy). Pt with some cognitive decline prior to hospitalization as well.Right homonymous hemianopsia   Continue 2. Antithrombotics: -DVT/anticoagulation:Pharmaceutical:Lovenoxadded -antiplatelet therapy: N/A due to bleed. 3.Chronic LBP/Headaches/Pain Management: Tylenol prn  Vascular HA post ICH, added topiramate 4. Mood:LCSW to follow for evaluation and support. -antipsychotic agents: N/a 5. Neuropsych: This patientis not fullycapable of making decisions on herown behalf. 6. Skin/Wound Care:Routine pressure relief measures. 7. Fluids/Electrolytes/Nutrition:Monitor I/O.  8. HTN: Monitor BP -continue metoprolol.   Lisinopril increased to 20 mg on 06/01, increased to 30 on 6/6   Remains elevated on 6/8, will consider further medication adjustments tomorrow  Late entry ECG reviewed  Monitor with increased mobility Vitals:   06/26/19 2024 06/27/19 0517  BP: (!) 172/64 (!) 142/46  Pulse: 72 70  Resp: 18 20  Temp: 98.2 F (36.8 C) 97.8 F (36.6 C)  SpO2: 96% 95%   9. T2DM with retinopathy: Hgb A1c-8.6. Was on Amaryl, NPH36u am/6u pmand regular insulins--23/23/27at home. Will continue to monitor BS ac/hs.   Levemir bid increased to 13 twice daily on 6/5 CBG (last 3)  Recent Labs    06/26/19 1653 06/26/19 2108 06/27/19 0559  GLUCAP [place] started on 6/8   10. FUO: Resolved Encourage IS.   WBC is within normal notes Dopplers limited, but negative for DVT  CXR personally reviewed, unremarkable  UA unremarkable, Ucx multiple species 10.  ABLA: Likely due to bleed.   Hb 10.1 on 6/7  Cont to monitor 11. Macrocytosis:  Vitamin B12 levels low on 6/4  Supplement initiated 12.  AKI on CKD: Baseline SCr- 1.5-1.6.   Cr.  1.60 on 6/7  Encourage fluids  Cont to monitor 13. Interstitial lung disease:IS/OOB 14. Anxiety disorder:On Zoloft. Will continue Seroquel and Klonopin at bedtime. 15.Dyslipidemia: Statin being held due to bleed--resume at discharge. 16. Hypoalbuminemia  Supplement initiated on 6/4  LOS: 5 days A FACE TO FACE EVALUATION WAS PERFORMED  Sheniya Garciaperez Lorie Phenix 06/27/2019, 9:05 AM

## 2019-06-27 NOTE — Progress Notes (Signed)
Speech Language Pathology Daily Session Note  Patient Details  Name: Sandra [name] MRN: [id number] Date of Birth: 1933/02/05  Today's Date: 06/27/2019 SLP Individual Time: 0730-0825 SLP Individual Time Calculation (min): 55 min  Short Term Goals: Week 1: SLP Short Term Goal 1 (Week 1): Patient will demonstrate selective attention to functional tasks in a mildly distracting enviornment for 30 minutes with supervision verbal cues for redirection. SLP Short Term Goal 2 (Week 1): Patient will attend to left field of enviornment during functional tasks with Min verbal cues. SLP Short Term Goal 3 (Week 1): Patient will demonstrate functional problem solving for mildly complex and familiar tasks with Min verbal cues. SLP Short Term Goal 4 (Week 1): Patient will recall new, daily information with Min verbal and visual cues. SLP Short Term Goal 5 (Week 1): Patient will self-monitor and correct errors during functional tasks with Min verbal cues.  Skilled Therapeutic Interventions: Pt was seen for skilled ST targeting cognition. SLP facilitated session with Mod A verbal and visual cues for semi-complex problem solving, Min A for error awareness during a semi-complex checkbook balancing activity. Pt did require extra time to complete the task, in addition to Min A verbal and visual cues for scanning left when writing in the [employer]. Majority of cues required at start of activity, with learning demonstrated throughout task. Pt became distracted when RN arrived to administer medications, requiring Min A verbal cues for redirection to functional tasks and end perseveration of topic of a particular medication. Pt left laying in bed with alarm set and needs within reach. Continue per current plan of care.         Pain Pain Assessment Pain Scale: 0-10 Pain Score: 5  Pain Type: Acute pain Pain Location: Head Pain Orientation: Right Pain Descriptors / Indicators: Headache Pain Onset: Gradual Pain  Intervention(s): RN made aware Multiple Pain Sites: No  Therapy/Group: Individual Therapy  Arbutus Leas 06/27/2019, 7:21 AM

## 2019-06-27 NOTE — Plan of Care (Signed)
  Problem: Consults Goal: RH STROKE PATIENT EDUCATION Description: See Patient Education module for education specifics  Outcome: Progressing Goal: Diabetes Guidelines if Diabetic/Glucose > 140 Description: If diabetic or lab glucose is > 140 mg/dl - Initiate Diabetes/Hyperglycemia Guidelines & Document Interventions  Outcome: Progressing   Problem: RH BOWEL ELIMINATION Goal: RH STG MANAGE BOWEL WITH ASSISTANCE Description: STG Manage Bowel with min Assistance. Outcome: Progressing Goal: RH STG MANAGE BOWEL W/MEDICATION W/ASSISTANCE Description: STG Manage Bowel with Medication with min Assistance. Outcome: Progressing   Problem: RH SKIN INTEGRITY Goal: RH STG SKIN FREE OF INFECTION/BREAKDOWN Description: Patient will not have any break down while on CIR Outcome: Progressing Goal: RH STG MAINTAIN SKIN INTEGRITY WITH ASSISTANCE Description: STG Maintain Skin Integrity With min Assistance. Outcome: Progressing   Problem: RH SAFETY Goal: RH STG ADHERE TO SAFETY PRECAUTIONS W/ASSISTANCE/DEVICE Description: STG Adhere to Safety Precautions With min Assistance/Device. Outcome: Progressing   Problem: RH COGNITION-NURSING Goal: RH STG USES MEMORY AIDS/STRATEGIES W/ASSIST TO PROBLEM SOLVE Description: STG Uses Memory Aids/Strategies With min Assistance to Problem Solve. Outcome: Progressing   Problem: RH PAIN MANAGEMENT Goal: RH STG PAIN MANAGED AT OR BELOW PT'S PAIN GOAL Description: Pain scale goal <3/10 Outcome: Progressing   Problem: RH KNOWLEDGE DEFICIT Goal: RH STG INCREASE KNOWLEDGE OF DIABETES Description: Patient will be able to verbalize steps in controlling blood sugar levels using handouts/resources and minimal assistance Outcome: Progressing Goal: RH STG INCREASE KNOWLEDGE OF HYPERTENSION Description: Patient will be able to verbalize the risks of hypertension using handouts and resources with minimal assistance Outcome: Progressing Goal: RH STG INCREASE KNOWLEDGE OF  STROKE PROPHYLAXIS Description: Patient will be able to verbalize medications for stroke prophylaxis using handouts/resources with minimal assistance Outcome: Progressing   Problem: RH Vision Goal: RH LTG Vision (Specify) Outcome: Progressing

## 2019-06-27 NOTE — Progress Notes (Signed)
Occupational Therapy Session Note  Patient Details  Name: Fayne Mcguffee MRN: [id number] Date of Birth: 03/01/1933  Today's Date: 06/27/2019 OT Individual Time: 4193-7902 OT Individual Time Calculation (min): 60 min    Short Term Goals: Week 1:  OT Short Term Goal 1 (Week 1): Pt will be able to sit to stand at toilet with CGA. OT Short Term Goal 2 (Week 1): Pt will complete stand pivot to toilet with CGA. OT Short Term Goal 3 (Week 1): Pt will demonstrate improved L side awareness by donning shirt with verbal cues only. OT Short Term Goal 4 (Week 1): Pt will be able to pull pants over hips with CGA for balance.  Skilled Therapeutic Interventions/Progress Updates:    Pt in bathroom with nurse tech at OT arrival just finishing up.  Pt completed pericare and clothing mgt with supervision.  Pt walked from bathroom to arm chair with CGA.  Pt reports feeling tired from therapy this morning and is wishing she could go home, but agreeable to work with OT at this time.  Pt reports feeling mild headache on right side of head.  Vitals taken: BP 124/56 mm Hg pulse 66 bpm and O2 on room air 100%.  Instructed pt through functional transfer training sit<>stand from various surfaces including w/c, armchair, and shower chair using RW and needing CGA throughout.  Discussed pt's bathroom layout and pt reports she does not have grab bars in her tub shower but does own a shower chair.  Pt needing CGA and use of grab bar to step over tub lip going in and coming out of shower. Recommended to pt on possible grab bar installation at home to increase safety during bathing.  Pt reports her dtr will be there to assist during all showers.  Pt returned to room in w/c with seat belt alarm on and donned, call bell in reach.   Therapy Documentation Precautions:  Precautions Precautions: Fall Precaution Comments: L hemianopsia, L inattention, apraxia, severe back pain (would use back precautions for  comfort) Restrictions Weight Bearing Restrictions: No   Pain: Pain Assessment Pain Scale: 0-10 Pain Score: 5  Pain Type: Acute pain Pain Location: Head Pain Orientation: Right Pain Descriptors / Indicators: Headache Pain Onset: Gradual Pain Intervention(s): RN made aware Multiple Pain Sites: No ADL: ADL Eating: Set up Grooming: Setup Upper Body Bathing: Setup Where Assessed-Upper Body Bathing: Sitting at sink Lower Body Bathing: Moderate assistance Where Assessed-Lower Body Bathing: Sitting at sink Upper Body Dressing: Moderate assistance Where Assessed-Upper Body Dressing: Sitting at sink Lower Body Dressing: Moderate assistance Where Assessed-Lower Body Dressing: Sitting at sink Toileting: Maximal assistance Where Assessed-Toileting: Glass blower/designer: Moderate assistance Toilet Transfer Method: Stand pivot Toilet Transfer Equipment: Grab bars      Therapy/Group: Individual Therapy  Ezekiel Slocumb 06/27/2019, 1:25 PM

## 2019-06-27 NOTE — Consult Note (Signed)
Neuropsychological Consultation   Patient:   Sandra [name]   DOB:   03-18-1933  MR Number:  [id number]  Location:  [facility] [id number] [place] [address] Dept: [place]: [phone]           Date of Service:   06/27/19  Start Time:   2 PM End Time:   3 PM  Provider/Observer:  Ilean Skill, Psy.D.       Clinical Neuropsychologist       Billing Code/Service: [phone]  Chief Complaint:    Sandra [name] is an 86 year old female with history of prior hemorrhagic stroke/TIA, diabetes with retinopathy, CKD, ILD, chronic LBP, fall 5/18 with mild injuries (No LOC) and recurrent fall on 06/17/19.  Patient with persistent HA and right elbow pain.   She presented to ED but left due to wait time.  Patient was admitted on 06/18/19 with reports of confusion, headaches, dizziness and difficulty walking.  Found left hemianopsia with mild neglect and CT head reveled focal hemorrhage in middle occipital lobe.  MRI done reveled hematoma right posterior temporoparietal lobe containing methemoglobin and extension into subdural area with small SDH and evidence of prior micro hemorrhages in left frontal and left parietal lobe.  Neurology question hypertension vs trauma vs amyloid as cause of bleed.  Patient some some memory issues (not knowing today how she got the bruise on her right elbow come from and having some word finding issues (not being able to come up with word insulin).  Some of these symptoms (retrieval issues for words and memories may be due to previous hemorrhages in frontal brain regions).    Reason for Service:  Patient was referred for neuropsychological consultation due to coping and adjustment issues.  She has had increase in anxiety, some memory and word finding issues etc.  Below is the HPI for the current admission.  Sandra [name] is an73 year old right-handed female with history  of prior hemorrhagic stroke/TIA, T2DMwith retinopathy, CKD (Dr. Blanche East), ILD (Dr. Vaughan Browner), chronic LBP, fall 06/06/19 with mild injuries and recurrent fall on 06/17/19 with reports of persistent HA and right elbow pain. She presented to theED that evening but left due to wait time. She was admitted on 06/18/2019 with reports of confusion, headaches, dizziness and difficulty walking. She was found to have left hemianopsia with mild neglect and CT of head done revealing focal hemorrhage in middle occipital lobe likely from hemorrhagic infarct and mild cytotoxic edema with small vessel disease. MRI brain done revealing hematoma right posterior temporoparietal lobe containing methemoglobin and extension into subdural speech with small SDH and evidence of prior microhemorrhages in left frontal and left parietal lobe likely due to hypertensive bleed.  Neurology question hypertension versus trauma versus amyloid as cause of bleed. Aspirin was discontinued and Cleviprex added to keep SBP less than 140. 2D echo done revealing EF 60 to 65% with grade 1 diastolic dysfunction and mild to moderate aortic sclerosis. MRA was negative for stenosis, aneurysm or vascular malformation.Dr. Erlinda Hong felt that liver bleed without secondary cause and felt that she was a good candidate for SATURNtrial.She did develop fever 101.2 early am and UA ordered for work up.She continues to have limitations due to headaches as well as chronic back pain, balance deficits, right gaze preference with left inattention and decreased awareness of deficits. CIR was recommended due to functional decline  Current Status:  Patient was laying back in bed with slight head  elevation.  She appeared oriented and was able to describe what she had been told about what happened and events prior.  She had two or more times of significant word finding and memory retrieval issues.  Cueing helped significantly.  Visual changes reported, likely due to  vascular events.  Patient reports that her anxiety has been elevated due to fears about these bleed happening again.  Patient reports that her anxiety has been improving some but is worried about how soon she can go home.  Patient was oriented with good recall and expressive language with the exception of a few clear but isolated times.    Behavioral Observation: Sandra [name]  presents as a 86 y.o.-year-old Right Caucasian Female who appeared her stated age. her dress was Appropriate and she was Well Groomed and her manners were Appropriate to the situation.  her participation was indicative of Appropriate and Redirectable behaviors.  There were physical disabilities noted.  she displayed an appropriate level of cooperation and motivation.     Interactions:    Active Appropriate and Redirectable  Attention:   abnormal and attention span appeared shorter than expected for age  Memory:   within normal limits; recent and remote memory intact with a few isolated retrieval issues.  Visuo-spatial:  Not assessed but patient describes symtpoms consistent with changes in ocipital lobe vasuclar events.  Speech (Volume):  normal  Speech:   normal; normal  Thought Process:  Coherent and Relevant  Though Content:  WNL; not suicidal and not homicidal  Orientation:   person, place, time/date and situation  Judgment:   Good  Planning:   Fair  Affect:    Anxious  Mood:    Anxious  Insight:   Fair  Intelligence:   normal   Medical History:   Past Medical History:  Diagnosis Date  . Abnormal blood finding 02/10/2016  . Abrasion of ear canal 09/28/2012  . Achilles tendon injury 11/27/2010  . ACID REFLUX DISEASE 05/12/2006   Qualifier: Diagnosis of  By: Larose Kells MD, [place] Acute MI ([place])   . ALLERGIC RHINITIS 05/12/2006   Qualifier: Diagnosis of  By: Larose Kells MD, [place] Anxiety state 07/03/2009   Qualifier: Diagnosis of  By: Larose Kells MD, [place] pain 07/01/2010  . Chronic kidney disease  (CKD), stage III (moderate) ([place]) 05/31/2014  . Chronic right SI joint pain 09/22/2013  . DEGENERATIVE JOINT DISEASE, CERVICAL SPINE 06/23/2006   Annotation: had a CAT scan with a cervical myelogram that show  left C6-7 and  C5-6 foraminal stenosis Qualifier: Diagnosis of  By: Larose Kells MD, [place] DEPRESSION 05/12/2006   Qualifier: Diagnosis of  By: Larose Kells MD, [place] DIABETES MELLITUS, TYPE II 05/12/2006   Now following w/ Endo at [facility] 04/27/2007   Qualifier: Diagnosis of  By: Jerold Coombe    . Dyspnea 02/10/2016  . Encephalopathy, hypertensive   . Essential hypertension 05/12/2006   Qualifier: Diagnosis of  By: Larose Kells MD, [place] Fatigue 05/31/2014  . GAIT DISTURBANCE 08/07/2009   Qualifier: Diagnosis of  By: Larose Kells MD, [place] GERD (gastroesophageal reflux disease)   . Headache(784.0) 11/27/2010  . Hyperlipidemia   . Hyperlipidemia associated with type 2 diabetes mellitus ([place]) 05/12/2006   Qualifier: Diagnosis of  By: Larose Kells MD, [place] ILD (interstitial lung disease) ([place]) 05/31/2015  .  Leukocytopenia 05/31/2014  . Lower abdominal pain 05/17/2014  . Nausea with vomiting 09/22/2013  . NECK PAIN, CHRONIC 04/03/2008   Qualifier: Diagnosis of  By: Larose Kells MD, [place] Nodule on liver 12/06/2015  . Osteoarthritis 02/10/2016  . Osteopenia   . Osteoporosis 06/23/2006   Annotation: had a bone density test in 08-2004.  T score was -2.4 Qualifier: Diagnosis of  By: Larose Kells MD, [place] SAH (subarachnoid hemorrhage) ([place])   . Sciatica   . Scleroderma ([place]) 02/28/2016  . Severe hypertension 03/18/2018  . Slurred speech 11/27/2010  . Takotsubo cardiomyopathy    s/p stress MI with stress induced CM with normal coronary arteries by cath 2007 with normalization of LVF by echo 04/2005  . TIA (transient ischemic attack)   . Unspecified cerebral artery occlusion with cerebral infarction 04/07/2012  . UTI (urinary tract infection) 11/02/2011  . 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  Vitamin D deficiency 10/01/2014   Psychiatric History:  Patient with prior history of depression and anxiety with acute exacerbation.  Clonazepam and Zoloft note in medications and low dose Seroquel at night for sleep.    Family Med/Psych History:  Family History  Problem Relation Age of Onset  . Intracerebral hemorrhage Daughter        assoc with post-partum  . Stroke Mother   . Diabetes Sister   . Asthma Sister   . Diabetes Sister   . Breast cancer Other        ?Aunt  . Cancer Other        breast?  . Coronary artery disease Neg Hx    Impression/DX:  Sandra [name] is an 86 year old female with history of prior hemorrhagic stroke/TIA, diabetes with retinopathy, CKD, ILD, chronic LBP, fall 5/18 with mild injuries (No LOC) and recurrent fall on 06/17/19.  Patient with persistent HA and right elbow pain.   She presented to ED but left due to wait time.  Patient was admitted on 06/18/19 with reports of confusion, headaches, dizziness and difficulty walking.  Found left hemianopsia with mild neglect and CT head reveled focal hemorrhage in middle occipital lobe.  MRI done reveled hematoma right posterior temporoparietal lobe containing methemoglobin and extension into subdural area with small SDH and evidence of prior micro hemorrhages in left frontal and left parietal lobe.  Neurology question hypertension vs trauma vs amyloid as cause of bleed.  Patient some some memory issues (not knowing today how she got the bruise on her right elbow come from and having some word finding issues (not being able to come up with word insulin).  Some of these symptoms (retrieval issues for words and memories may be due to previous hemorrhages in frontal brain regions).    Patient was laying back in bed with slight head elevation.  She appeared oriented and was able to describe what she had been told about what happened and events prior.  She had two or more times of significant word finding and memory retrieval issues.   Cueing helped significantly.  Visual changes reported, likely due to vascular events.  Patient reports that her anxiety has been elevated due to fears about these bleed happening again.  Patient reports that her anxiety has been improving some but is worried about how soon she can go home.  Patient was oriented with good recall and expressive language with the exception of a few clear but isolated times.   Disposition/Plan:  Will follow-up with patient later this  week.          Electronically Signed   _______________________ Ilean Skill, Psy.D.

## 2019-06-28 ENCOUNTER — Inpatient Hospital Stay (HOSPITAL_COMMUNITY): Payer: Medicare HMO | Admitting: Occupational Therapy

## 2019-06-28 ENCOUNTER — Inpatient Hospital Stay (HOSPITAL_COMMUNITY): Payer: Medicare HMO | Admitting: Speech Pathology

## 2019-06-28 ENCOUNTER — Inpatient Hospital Stay (HOSPITAL_COMMUNITY): Payer: Medicare HMO | Admitting: Physical Therapy

## 2019-06-28 ENCOUNTER — Inpatient Hospital Stay (HOSPITAL_COMMUNITY): Payer: Medicare HMO

## 2019-06-28 LAB — GLUCOSE, CAPILLARY
Glucose-Capillary: 162 mg/dL — ABNORMAL HIGH (ref 70–99)
Glucose-Capillary: 283 mg/dL — ABNORMAL HIGH (ref 70–99)
Glucose-Capillary: 211 mg/dL — ABNORMAL HIGH (ref 70–99)
Glucose-Capillary: 144 mg/dL — ABNORMAL HIGH (ref 70–99)
Glucose-Capillary: 163 mg/dL — ABNORMAL HIGH (ref 70–99)

## 2019-06-28 NOTE — Progress Notes (Signed)
Occupational Therapy Session Note  Patient Details  Name: Sandra [name] MRN: [id number] Date of Birth: 11-Apr-1933  Today's Date: 06/28/2019 OT Individual Time: 1135-1210 OT Individual Time Calculation (min): 35 min    Short Term Goals: Week 1:  OT Short Term Goal 1 (Week 1): Pt will be able to sit to stand at toilet with CGA. OT Short Term Goal 2 (Week 1): Pt will complete stand pivot to toilet with CGA. OT Short Term Goal 3 (Week 1): Pt will demonstrate improved L side awareness by donning shirt with verbal cues only. OT Short Term Goal 4 (Week 1): Pt will be able to pull pants over hips with CGA for balance.  Skilled Therapeutic Interventions/Progress Updates:    Pt supine in bed, agreeable to participating with OT. Pt with intermittent complaints of pain in low back throughout session. Pt completed supine to sit with independence. Pt needing max assist to donn shoes. Pt completed short distance ambulation to w/c using RW with supervision. Educated pt on use of elastic shoe laces to facilitate increased independence when donning/doffing shoes. Pt able to donn shoes using elastic shoe laces with mod assist and doff with supervision and VCs for technique.  Pt participated in standing functional reach and dynamic balance activity to promote increased standing balance. Pt reports she is afraid of falling when BUE are not on RW.  Pt stood x 1 minute for 2 trials to reach in all directions including crossing midline with BUE without LOB.  Pt returned to bed per pt request at end of session with supervision and heating pad placed on lower back.  Bed alarm on, bed rails placed, call bell in reach.  Therapy Documentation Precautions:  Precautions Precautions: Fall Precaution Comments: L hemianopsia, L inattention, apraxia, severe back pain (would use back precautions for comfort) Restrictions Weight Bearing Restrictions: No   Pain: Pain Assessment Pain Scale: 0-10 Pain Score: 2  Pain Type:  Acute pain Pain Location: Back Pain Orientation: Lower Pain Descriptors / Indicators: Aching;Sore Pain Onset: On-going Patients Stated Pain Goal: 0 Pain Intervention(s): Repositioned;Ambulation/increased activity Multiple Pain Sites: No ADL: ADL Eating: Set up Grooming: Setup Upper Body Bathing: Setup Where Assessed-Upper Body Bathing: Sitting at sink Lower Body Bathing: Moderate assistance Where Assessed-Lower Body Bathing: Sitting at sink Upper Body Dressing: Moderate assistance Where Assessed-Upper Body Dressing: Sitting at sink Lower Body Dressing: Moderate assistance Where Assessed-Lower Body Dressing: Sitting at sink Toileting: Maximal assistance Where Assessed-Toileting: Glass blower/designer: Moderate assistance Toilet Transfer Method: Stand pivot Toilet Transfer Equipment: Grab bars      Therapy/Group: Individual Therapy  Ezekiel Slocumb 06/28/2019, 1:33 PM

## 2019-06-28 NOTE — Progress Notes (Signed)
Speech Language Pathology Daily Session Note  Patient Details  Name: Anokhi Shannon MRN: [id number] Date of Birth: 07-17-1933  Today's Date: 06/28/2019 SLP Individual Time: 0729-0828 SLP Individual Time Calculation (min): 59 min  Short Term Goals: Week 1: SLP Short Term Goal 1 (Week 1): Patient will demonstrate selective attention to functional tasks in a mildly distracting enviornment for 30 minutes with supervision verbal cues for redirection. SLP Short Term Goal 2 (Week 1): Patient will attend to left field of enviornment during functional tasks with Min verbal cues. SLP Short Term Goal 3 (Week 1): Patient will demonstrate functional problem solving for mildly complex and familiar tasks with Min verbal cues. SLP Short Term Goal 4 (Week 1): Patient will recall new, daily information with Min verbal and visual cues. SLP Short Term Goal 5 (Week 1): Patient will self-monitor and correct errors during functional tasks with Min verbal cues.  Skilled Therapeutic Interventions: Pt was seen for skilled ST targeting cognition. Pt reported feeling as though it was difficult to keep track of the days, therefore wall calendar posted in room. During a semi-complex money scenario task, pt required Supervision A verbal cues for problem solving and error awareness, and Min A verbal cues for recall throughout task. Pt became somewhat self-limiting near the end when faced with more complex questions, however receptive to encouragement.  Engaged pt in functional conversation regarding medications to target recall. She verbally recalled taking insulin and 1 cholesterol medication prior to admission (<25% of home med regimen). Pt and SLP used list making as compensatory memory strategy to record current medications and increase learning/future recall. Due to time constraints, pt did not attempt organizing pill box according to list, however ST will target at next available date. Overall Min A verbal cues provided for  redirection to tasks throughout session due to pt's internal distractions. Pt left laying in bed with alarm set and needs within reach. Continue per current plan of care.      Pain Pain Assessment Pain Scale: 0-10 Pain Score: 0-No pain  Therapy/Group: Individual Therapy  Arbutus Leas 06/28/2019, 6:53 AM

## 2019-06-28 NOTE — Progress Notes (Addendum)
Sandra [name]  Subjective/Complaints: Patient seen sitting up in bed this AM.  She states she slept well overnight, but is still sleepy this AM.  She notes some improvement in vision.   ROS: Denies CP, SOB, N/V/D  Objective: Vital Signs: Blood pressure (!) 119/43, pulse 67, temperature 97.6 F (36.4 C), temperature source Oral, resp. rate 18, height 5' (1.524 m), weight 65.9 kg, SpO2 97 %. No results found. Recent Labs    06/26/19 0750  WBC 4.4  HGB 10.1*  HCT 30.7*  PLT 255   Recent Labs    06/26/19 0750  NA 137  K 3.9  CL 106  CO2 22  GLUCOSE 263*  BUN 45*  CREATININE 1.60*  CALCIUM 8.9    Physical Exam: BP (!) 119/43 (BP Location: Left Arm)   Pulse 67   Temp 97.6 F (36.4 C) (Oral)   Resp 18   Ht 5' (1.524 m)   Wt 65.9 kg   SpO2 97%   BMI 28.37 kg/m  Constitutional: No distress . Vital signs reviewed. HENT: Normocephalic.  Atraumatic. Eyes: EOMI. No discharge.  Cardiovascular: No JVD. Respiratory: Normal effort.  No stridor. GI: Non-distended. Skin: Warm and dry.  Intact. Psych: Normal mood.  Normal behavior. Musc: No edema in extremities.  No tenderness in extremities. Neurologic: Alert Motor: RUE/RLE: 5/5 proximal distal LUE: 4+-5/5 proximal distal LLE: 4+-5/5 proximal distal Left homonymous hemianopsia, ?improving Left upper extremity dysmetria  Assessment/Plan: 1. Functional deficits secondary to right occipital ICH which require 3+ hours per day of interdisciplinary therapy in a comprehensive inpatient rehab setting.  Physiatrist is providing close team supervision and 24 hour management of active medical problems listed below.  Physiatrist and rehab team continue to assess barriers to discharge/monitor patient progress toward functional and medical goals  Care Tool:  Bathing    Body parts bathed by patient: Right arm, Left arm, Chest, Abdomen, Front perineal area, Buttocks, Right upper leg,  Left upper leg, Right lower leg, Left lower leg, Face   Body parts bathed by helper: Front perineal area, Buttocks     Bathing assist Assist Level: Contact Guard/Touching assist     Upper Body Dressing/Undressing Upper body dressing   What is the patient wearing?: Pull over shirt    Upper body assist Assist Level: Set up assist    Lower Body Dressing/Undressing Lower body dressing      What is the patient wearing?: Underwear/pull up, Pants     Lower body assist Assist for lower body dressing: Minimal Assistance - Patient > 75%     Toileting Toileting    Toileting assist Assist for toileting: Supervision/Verbal cueing     Transfers Chair/bed transfer  Transfers assist     Chair/bed transfer assist level: Contact Guard/Touching assist     Locomotion Ambulation   Ambulation assist   Ambulation activity did not occur: Safety/medical concerns(severe back pain)  Assist level: Contact Guard/Touching assist Assistive device: Walker-rolling Max distance: 300   Walk 10 feet activity   Assist  Walk 10 feet activity did not occur: Safety/medical concerns(severe back pain)  Assist level: Supervision/Verbal cueing Assistive device: Walker-rolling   Walk 50 feet activity   Assist Walk 50 feet with 2 turns activity did not occur: Safety/medical concerns(severe back pain)  Assist level: Supervision/Verbal cueing Assistive device: Walker-rolling    Walk 150 feet activity   Assist Walk 150 feet activity did not occur: Safety/medical concerns(severe back pain)  Assist level: Contact Guard/Touching assist Assistive  device: Walker-rolling    Walk 10 feet on uneven surface  activity   Assist Walk 10 feet on uneven surfaces activity did not occur: Safety/medical concerns(severe back pain)         Wheelchair     Assist Will patient use wheelchair at discharge?: (TBD) Type of Wheelchair: Manual    Wheelchair assist level: Contact Guard/Touching  assist Max wheelchair distance: 161ft    Wheelchair 50 feet with 2 turns activity    Assist        Assist Level: Independent   Wheelchair 150 feet activity     Assist     Assist Level: Minimal Assistance - Patient > 75%      Medical Problem List and Plan: 1.Left sided weakness and visual-spatial, cognitive deficitssecondary to right occipital ICH (hypertensive vs amyloid angiopathy). Pt with some cognitive decline prior to hospitalization as well. Left homonymous hemianopsia   Continue CIR  Team conference today to discuss current and goals and coordination of care, home and environmental barriers, and discharge planning with nursing, case manager, and therapies.  2. Antithrombotics: -DVT/anticoagulation:Pharmaceutical:Lovenoxadded -antiplatelet therapy: N/A due to bleed. 3.Chronic LBP/Headaches/Pain Management: Tylenol prn  Vascular HA post ICH, added topiramate  Controlled on 6/9 4. Mood:LCSW to follow for evaluation and support. -antipsychotic agents: N/a 5. Neuropsych: This patientis not fullycapable of making decisions on herown behalf. 6. Skin/Wound Care:Routine pressure relief measures. 7. Fluids/Electrolytes/Nutrition:Monitor I/O.  8. HTN: Monitor BP -continue metoprolol.   Lisinopril increased to 20 mg on 06/01, increased to 30 on 6/6   Relatively controlled on 6/9  Late entry ECG reviewed  Monitor with increased mobility Vitals:   06/27/19 1942 06/28/19 0443  BP: (!) 144/45 (!) 119/43  Pulse: 62 67  Resp: 16 18  Temp: (!) 97.5 F (36.4 C) 97.6 F (36.4 C)  SpO2: 97% 97%   9. T2DM with retinopathy: Hgb A1c-8.6. Was on Amaryl, NPH36u am/6u pmand regular insulins--23/23/27at home. Will continue to monitor BS ac/hs.   Levemir bid increased to 13 twice daily on 6/5 CBG (last 3)  Recent Labs    06/27/19 1641 06/27/19 2123 06/28/19 0612  GLUCAP 138* 196* 162*   Amaral started on 6/8  Remains elevated  on 6/9 10. FUO: Resolved Encourage IS.   WBC is within normal notes Dopplers limited, but negative for DVT  CXR personally reviewed, unremarkable  UA unremarkable, Ucx multiple species 10. ABLA: Likely due to bleed.   Hb 10.1 on 6/7  Cont to monitor 11. Macrocytosis:  Vitamin B12 levels low on 6/4  Supplement initiated 12.  AKI on CKD: Baseline SCr- 1.5-1.6.   Cr.  1.60 on 6/7, labs ordered for tomorrow  Encourage fluids  Cont to monitor 13. Interstitial lung disease:IS/OOB 14. Anxiety disorder:On Zoloft. Will continue Seroquel and Klonopin at bedtime. 15.Dyslipidemia: Statin started due to SATURN trial.  16. Hypoalbuminemia  Supplement initiated on 6/4  LOS: 6 days A FACE TO FACE EVALUATION WAS PERFORMED  Sandra [name] Lorie Phenix 06/28/2019, 8:43 AM

## 2019-06-28 NOTE — Patient Care Conference (Signed)
Inpatient RehabilitationTeam Conference and Plan of Care Update Date: 06/28/2019   Time: 2:59 PM    Patient Name: Sandra [name]      Medical Record Number: [id number]  Date of Birth: 1933/12/27 Sex: Female         Room/Bed: 4W05C/4W05C-01 Payor Info: Payor: [employer] MEDICARE / Plan: [place] HMO / Product Type: *No Product type* /    Admit Date/Time:  06/22/2019  5:36 PM  Primary Diagnosis:  Lobar cerebral hemorrhage [facility])  Patient Active Problem List   Diagnosis Date Noted  . AKI (acute kidney injury) ([place])   . Uncontrolled type 2 diabetes mellitus with hyperglycemia ([place])   . Benign essential HTN   . Hypoalbuminemia due to protein-calorie malnutrition ([place])   . Macrocytosis   . Acute blood loss anemia   . FUO (fever of unknown origin)   . Diabetic retinopathy of left eye associated with type 2 diabetes mellitus ([place])   . Cognitive impairment 06/22/2019  . Lobar cerebral hemorrhage ([place]) 06/22/2019  . ICH (intracerebral hemorrhage) ([facility]) - R occipital - HTN vs CAA 06/18/2019  . TIA (transient ischemic attack) 04/26/2019  . Diarrhea 04/24/2019  . Severe hypertension 03/18/2018  . Encephalopathy, hypertensive   . Acute encephalopathy 03/17/2018  . Scleroderma ([place]) 02/28/2016  . Dyspnea 02/10/2016  . Abnormal blood finding 02/10/2016  . CAD (coronary artery disease) 02/10/2016  . Osteoarthritis 02/10/2016  . Nodule on liver 12/06/2015  . ILD (interstitial lung disease) ([place]) 05/31/2015  . Type 2 diabetes mellitus with hyperglycemia ([place]) 03/11/2015  . Diabetes mellitus type 2 with retinopathy ([place]) 03/04/2015  . Vitamin D deficiency 10/01/2014  . Leukocytopenia 05/31/2014  . Chronic kidney disease (CKD), stage III (moderate) ([place]) 05/31/2014  . Fatigue 05/31/2014  . Lower abdominal pain 05/17/2014  . Nausea with vomiting 09/22/2013  . Chronic right SI joint pain 09/22/2013  . 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 Abrasion of ear canal 09/28/2012  . History of subarachnoid  hemorrhage 04/07/2012  . Unspecified cerebral artery occlusion with cerebral infarction 04/07/2012  . UTI (urinary tract infection) 11/02/2011  . General medical examination 01/07/2011  . Chest pain 11/27/2010  . Achilles tendon injury 11/27/2010  . Back pain 07/01/2010  . Breast pain 04/15/2010  . GAIT DISTURBANCE 08/07/2009  . Anxiety state 07/03/2009  . VALVULAR HEART DISEASE 05/23/2008  . NECK PAIN, CHRONIC 04/03/2008  . DEGENERATIVE JOINT DISEASE, CERVICAL SPINE 06/23/2006  . Osteoporosis 06/23/2006  . Hyperlipidemia associated with type 2 diabetes mellitus ([place]) 05/12/2006  . DEPRESSION 05/12/2006  . Essential hypertension 05/12/2006  . ALLERGIC RHINITIS 05/12/2006  . ACID REFLUX DISEASE 05/12/2006  . SCIATICA 05/12/2006    Expected Discharge Date: Expected Discharge Date: 07/05/19  Team Members Present: Physician leading conference: Dr. Delice Lesch Care Coodinator Present: Nestor Lewandowsky, RN, BSN, CRRN;Christina Sampson Goon, [place] Nurse Present: Other (comment)(Nikk Archie, RN) PT Present: Deniece Ree, PT OT Present: Other (comment)(Bonnie Irish Elders, OT) SLP Present: Jettie Booze, CF-SLP PPS Coordinator present : Ileana Ladd, PT     Current Status/Progress Goal Weekly Team Focus  Bowel/Bladder   Continent of bowel/bladder.  To remain continent of bowel/bladder.  Assess tolieting needs often, answer call lights promptly.   Swallow/Nutrition/ Hydration             ADL's   Supervision for toileting, min assist to CGA for bathing and transfer, setup UB dressing,min assist for LB dressing, max assist for footwear  setup to supervision for all ADLs  ADL training, shower chair and toilet transfer training,  standing balance   Mobility   cga to close supervision gait greater than 363ft w/RW  mod I      Communication             Safety/Cognition/ Behavioral Observations  Min A basic to mildly complex  Supervision A complex  recall, semi-complex to complex problem solving,  selective attention, emergent awareness   Pain   Pain level is 5/10 due to constant headaches, pt is on topamax and can get prn tylenol when needed.  To lower pain level below 2/10.  Assess pain q shift or prn.   Skin   Skin is intact  To prevent any breakdown from occuring  Assess skin q shift or prn.    Rehab Goals Patient on target to meet rehab goals: Yes Rehab Goals Revised: Patient on target with current goals *See Care Plan and progress notes for long and short-term goals.     Barriers to Discharge  Current Status/Progress Possible Resolutions Date Resolved   Nursing                  PT  Home environment access/layout  2 STE no rails              OT                  SLP                Care Coordinator                Discharge Planning/Teaching Needs:  Goal to discharge home with daughter and granddaughter to provide care  Will schedule family education if reccommended   Team Discussion:  MD adjusting medications to be more like home medication regimen. Creatinine level off; hx CKI = encourage fluids; patient is able to drink fluids without difficulty. Note intermittent confusion; staff recommend 24/7supervison for discharge.  Revisions to Treatment Plan:  None    Medical Summary Current Status: Left sided weakness and visual-spatial, cognitive deficits secondary to right occipital ICH (hypertensive vs amyloid angiopathy). Weekly Focus/Goal: Improve therapies, vascular headaches, BP, CBGs, AKI  Barriers to Discharge: Medical stability   Possible Resolutions to Barriers: Therapies, encourage fluids, follow labs, optimize DM/BP meds, optimize headache meds   Continued Need for Acute Rehabilitation Level of Care: The patient requires daily medical management by a physician with specialized training in physical medicine and rehabilitation for the following reasons: Direction of a multidisciplinary physical rehabilitation program to maximize functional independence :  Yes Medical management of patient stability for increased activity during participation in an intensive rehabilitation regime.: Yes Analysis of laboratory values and/or radiology reports with any subsequent need for medication adjustment and/or medical intervention. : Yes   I attest that I was present, lead the team conference, and concur with the assessment and plan of the team.   Dorien Chihuahua B 06/28/2019, 2:59 PM

## 2019-06-28 NOTE — Progress Notes (Signed)
Occupational Therapy Session Note  Patient Details  Name: Sandra [name] MRN: [id number] Date of Birth: 12-May-1933  Today's Date: 06/28/2019 OT Individual Time: 1345-1425 OT Individual Time Calculation (min): 40 min    Short Term Goals: Week 1:  OT Short Term Goal 1 (Week 1): Pt will be able to sit to stand at toilet with CGA. OT Short Term Goal 2 (Week 1): Pt will complete stand pivot to toilet with CGA. OT Short Term Goal 3 (Week 1): Pt will demonstrate improved L side awareness by donning shirt with verbal cues only. OT Short Term Goal 4 (Week 1): Pt will be able to pull pants over hips with CGA for balance.  Skilled Therapeutic Interventions/Progress Updates:   Pt resting in bed upon arrival.  Pt initially upset 2/2 notification of discharge date.  Pt states she lives with her daughter and grand daughter and she can go home now.  With continued discussion pt acknowledges her "left isn't working right." Pt also states she isn't moving the way she wants.  Pt agreeable to sitting EOB and engaging in peg board activitiy.  Pt engaged in replicating +pattern with colored pets.  Pt started tasks without difficulty but increased time.  Pt required max verbal cues and rest breaks to complete 2nd half of pattern. Pt ackowledged that she became confused and was having difficulty because of her left eye, although she denies diplopia or blurred vision. Pt agreed at the end of the session that she probably needs to stay until discharge date.  Pt remained in bed with all needs within reach and bed alarm activated.    Therapy Documentation Precautions:  Precautions Precautions: Fall Precaution Comments: L hemianopsia, L inattention, apraxia, severe back pain (would use back precautions for comfort) Restrictions Weight Bearing Restrictions: No Pain: Pain Assessment Pain Scale: 0-10 Pain Score: 2  Pain Type: Acute pain Pain Location: Back Pain Orientation: Lower Pain Descriptors / Indicators:  Aching;Sore Pain Onset: On-going Patients Stated Pain Goal: 0 Pain Intervention(s): Repositioned Multiple Pain Sites: No   Therapy/Group: Individual Therapy  Leroy Libman 06/28/2019, 2:36 PM

## 2019-06-28 NOTE — Progress Notes (Signed)
Patient ID: Sandra [name], female   DOB: [birth year], 86 y.o.   MRN: [id number]   Team Conference Report to Patient/Family  Team Conference discussion was reviewed with the patient and caregiver, including goals, any changes in plan of care and target discharge date.  Patient and caregiver express understanding and are in agreement.  The patient has a target discharge date of 07/05/19.  [name] 06/28/2019, 1:33 PM

## 2019-06-28 NOTE — Progress Notes (Signed)
Physical Therapy Session Note  Patient Details  Name: Sandra [name] MRN: [id number] Date of Birth: 09-16-1933  Today's Date: 06/28/2019 PT Individual Time: 0900-0958 PT Individual Time Calculation (min): 58 min   Short Term Goals: Week 1:  PT Short Term Goal 1 (Week 1): Patient will be able to perform all bed mobility with modA consistently PT Short Term Goal 2 (Week 1): Patient to require no more than ModA for functional transfers PT Short Term Goal 3 (Week 1): Patient to initiate gait training  Skilled Therapeutic Interventions/Progress Updates:    Patient received in bed, pleasant and willing to work with therapy this morning. Able to complete bed mobility and functional transfers during session with S, did require Min guard for gait training approximately 365f with RW as well as MinA for single step navigation with RW as well today (mostly for managing RW with cues for sequencing). Tolerated riding Nustep 8 minutes on level 1 with BUEs/BLEs, then introduced gait training with no device however fatigued very quickly and only able to tolerate 346fdistance with MinA for balance due to L LE weakness and poor L LE coordination. Also practiced step training with RW/MInA for RW management and needed Mod cues for sequencing but generally able to perform a single step safely with this technique. [facility] NT present and attending.   Therapy Documentation Precautions:  Precautions Precautions: Fall Precaution Comments: L hemianopsia, L inattention, apraxia, severe back pain (would use back precautions for comfort) Restrictions Weight Bearing Restrictions: No Pain: Pain Assessment Pain Scale: 0-10 Pain Score: 2  Pain Type: Acute pain Pain Location: Back Pain Orientation: Lower Pain Descriptors / Indicators: Aching;Sore Pain Onset: On-going Patients Stated Pain Goal: 0 Pain Intervention(s): Repositioned;Ambulation/increased activity Multiple Pain Sites:  No    Therapy/Group: Individual Therapy  KrWindell NorfolkDPT, PN1   Supplemental Physical Therapist [place]  Pager [phone] Rehab Office [phone] 06/28/2019, 12:23 PM

## 2019-06-29 ENCOUNTER — Inpatient Hospital Stay (HOSPITAL_COMMUNITY): Payer: Medicare HMO | Admitting: Physical Therapy

## 2019-06-29 ENCOUNTER — Inpatient Hospital Stay (HOSPITAL_COMMUNITY): Payer: Medicare HMO

## 2019-06-29 ENCOUNTER — Inpatient Hospital Stay (HOSPITAL_COMMUNITY): Payer: Medicare HMO | Admitting: Speech Pathology

## 2019-06-29 DIAGNOSIS — R0989 Other specified symptoms and signs involving the circulatory and respiratory systems: Secondary | ICD-10-CM

## 2019-06-29 LAB — BASIC METABOLIC PANEL
Anion gap: 10 (ref 5–15)
BUN: 62 mg/dL — ABNORMAL HIGH (ref 8–23)
CO2: 22 mmol/L (ref 22–32)
Calcium: 9 mg/dL (ref 8.9–10.3)
Chloride: 107 mmol/L (ref 98–111)
Creatinine, Ser: 1.63 mg/dL — ABNORMAL HIGH (ref 0.44–1.00)
GFR calc Af Amer: 33 mL/min — ABNORMAL LOW (ref >60)
GFR calc non Af Amer: 29 mL/min — ABNORMAL LOW (ref >60)
Glucose, Bld: 207 mg/dL — ABNORMAL HIGH (ref 70–99)
Potassium: 3.5 mmol/L (ref 3.5–5.1)
Sodium: 139 mmol/L (ref 135–145)

## 2019-06-29 LAB — GLUCOSE, CAPILLARY
Glucose-Capillary: 201 mg/dL — ABNORMAL HIGH (ref 70–99)
Glucose-Capillary: 216 mg/dL — ABNORMAL HIGH (ref 70–99)
Glucose-Capillary: 201 mg/dL — ABNORMAL HIGH (ref 70–99)
Glucose-Capillary: 167 mg/dL — ABNORMAL HIGH (ref 70–99)

## 2019-06-29 MED ORDER — SODIUM CHLORIDE 0.9 % IV SOLN: INTRAVENOUS | Status: DC

## 2019-06-29 NOTE — Progress Notes (Signed)
Physical Therapy Session Note  Patient Details  Name: Sandra [name] MRN: [id number] Date of Birth: 09/14/1933  Today's Date: 06/29/2019 PT Individual Time: 0905-1000 PT Individual Time Calculation (min): 55 min   Short Term Goals: Week 1:  PT Short Term Goal 1 (Week 1): Patient will be able to perform all bed mobility with modA consistently PT Short Term Goal 2 (Week 1): Patient to require no more than ModA for functional transfers PT Short Term Goal 3 (Week 1): Patient to initiate gait training  Skilled Therapeutic Interventions/Progress Updates:    Patient received in bed, just more "down" today and not feeling well due to back pain, however able to complete all mobility with S and RW but limited by back pain. Seemed more confused this morning, first perseverating on Donald Trump and him "returning to office in August!!!", then perseverating even harder on taking a bath and washing up. Able to toilet today with distant S but cues to not get up without PT being ready due to impulsivity. Needed Mod cues and S for clothing management and for washing up at the sink. Provided emotional support while RN was trying to get IV site as well. Tolerated gait training approximately 150ft with RW but gait speed slow and limited by increased back pain today. Very down and low energy today, often stating "why did the lord do this?" with PT. Left in bed with all needs met, bed alarm active.   Therapy Documentation Precautions:  Precautions Precautions: Fall Precaution Comments: L hemianopsia, L inattention, apraxia, severe back pain (would use back precautions for comfort) Restrictions Weight Bearing Restrictions: No Pain: Pain Assessment Pain Scale: Faces Faces Pain Scale: Hurts even more Pain Type: Chronic pain Pain Location: Back Pain Orientation: Lower Pain Descriptors / Indicators: Aching;Sore Pain Onset: On-going Patients Stated Pain Goal: 0 Pain Intervention(s):  Repositioned;Ambulation/increased activity;RN made aware Multiple Pain Sites: No    Therapy/Group: Individual Therapy   U PT, DPT, PN1   Supplemental Physical Therapist [place]    Pager [phone] Acute Rehab Office [phone]   06/29/2019, 12:10 PM  

## 2019-06-29 NOTE — Progress Notes (Signed)
Physical Therapy Session Note  Patient Details  Name: Sandra [name] MRN: [id number] Date of Birth: [birth year]  Today's Date: 06/29/2019 PT Individual Time: 4944-9675 PT Individual Time Calculation (min): 30 min   Short Term Goals: Week 1:  PT Short Term Goal 1 (Week 1): Patient will be able to perform all bed mobility with modA consistently PT Short Term Goal 2 (Week 1): Patient to require no more than ModA for functional transfers PT Short Term Goal 3 (Week 1): Patient to initiate gait training  Skilled Therapeutic Interventions/Progress Updates:    Patient received in bed asleep but willing to work with therapy. Able to complete all bed mobility and functional transfers with RW and S today. Transferred from bed to [facility] with mod cues for navigation due to her being a bit confused on how to work around IV, then tolerated riding Nustep for 6 minutes with BLEs on level 2. Left up in [facility] with all needs met, seatbelt alarm active and visitor present.   Therapy Documentation Precautions:  Precautions Precautions: Fall Precaution Comments: L hemianopsia, L inattention, apraxia, severe back pain (would use back precautions for comfort) Restrictions Weight Bearing Restrictions: No Pain: Pain Assessment Pain Scale: Faces Pain Score: 0-No pain Faces Pain Scale: Hurts little more Pain Type: Chronic pain Pain Location: Back Pain Orientation: Lower Pain Descriptors / Indicators: Aching;Sore Pain Onset: On-going Patients Stated Pain Goal: 0 Pain Intervention(s): Repositioned;Ambulation/increased activity Multiple Pain Sites: No    Therapy/Group: Individual Therapy   Windell Norfolk, DPT, PN1   Supplemental Physical Therapist [place]    Pager [phone] Acute Rehab Office [phone]    06/29/2019, 3:34 PM

## 2019-06-29 NOTE — Progress Notes (Signed)
Physical Therapy Session Note  Patient Details  Name: Sandra [name] MRN: [id number] Date of Birth: 1933-09-22  Today's Date: 06/29/2019 PT Individual Time: 1026-1101 PT Individual Time Calculation (min): 35 min   Short Term Goals: Week 1:  PT Short Term Goal 1 (Week 1): Patient will be able to perform all bed mobility with modA consistently PT Short Term Goal 2 (Week 1): Patient to require no more than ModA for functional transfers PT Short Term Goal 3 (Week 1): Patient to initiate gait training  Skilled Therapeutic Interventions/Progress Updates:    Pt reports being "shook up" from the attempt of having an IV placed this morning and now awaiting the IV team. Encouragement and support provided throughout the session. Pt performed bed mobility with extra time with overall supervision with cues for log roll technique to decrease pressure in back due to chronic back pain. Sit <> stands with close supervision with RW and requires CGA without AD to stabilize once in standing. Functional gait training on unit with RW for functional mobility training to and from therapy gym ~ 120' each direction with overall close supervision with cues for pathfinding, upright posture, and attention to obstacles on the L. Sit <> stands and dynamic standing balance and coordination re-training without UE support for toe taps x 5 reps each on R and L x 2 sets total with seated break due. Requires heavy min assist for balance and extra time due to pt reporting fear of falling. End of session returned back to bed to await IV team. Repositioned in bed without assistance. D/c planning discussed during rest breaks as well as awareness of impacts of deficits and fall risk. Pt also reporting interest in a rollator for community mobility.   Therapy Documentation Precautions:  Precautions Precautions: Fall Precaution Comments: L hemianopsia, L inattention, apraxia, severe back pain (would use back precautions for  comfort) Restrictions Weight Bearing Restrictions: No  Pain: Pain Assessment Pain Scale: 0-10 Pain Score: 0-No pain    Therapy/Group: Individual Therapy  Canary Brim Ivory Broad, PT, DPT, CBIS  06/29/2019, 11:02 AM

## 2019-06-29 NOTE — Progress Notes (Signed)
[place] PHYSICAL MEDICINE & REHABILITATION PROGRESS NOTE  Subjective/Complaints: Patient seen sitting up in bed this morning.  She states she slept well overnight.  She is about to begin therapies.  She notes improvement in vision.  ROS: Denies CP, SOB, N/V/D  Objective: Vital Signs: Blood pressure (!) 117/52, pulse 75, temperature 98.2 F (36.8 C), temperature source Oral, resp. rate 14, height 5' (1.524 m), weight 65.9 kg, SpO2 99 %. No results found. No results for input(s): WBC, HGB, HCT, PLT in the last 72 hours. Recent Labs    06/29/19 0639  NA 139  K 3.5  CL 107  CO2 22  GLUCOSE 207*  BUN 62*  CREATININE 1.63*  CALCIUM 9.0    Physical Exam: BP (!) 117/52 (BP Location: Left Arm)   Pulse 75   Temp 98.2 F (36.8 C) (Oral)   Resp 14   Ht 5' (1.524 m)   Wt 65.9 kg   SpO2 99%   BMI 28.37 kg/m  Constitutional: No distress . Vital signs reviewed. HENT: Normocephalic.  Atraumatic. Eyes: EOMI. No discharge. Cardiovascular: No JVD. Respiratory: Normal effort.  No stridor. GI: Non-distended. Skin: Warm and dry.  Intact. Psych: Normal mood.  Normal behavior. Musc: No edema in extremities.  No tenderness in extremities. Neurologic: Alert Motor: RUE/RLE: 5/5 proximal distal LUE: 4+-5/5 proximal distal LLE: 4+-5/5 proximal distal Left homonymous hemianopsia, ?  Improving Left upper extremity dysmetria  Assessment/Plan: 1. Functional deficits secondary to right occipital ICH which require 3+ hours per day of interdisciplinary therapy in a comprehensive inpatient rehab setting.  Physiatrist is providing close team supervision and 24 hour management of active medical problems listed below.  Physiatrist and rehab team continue to assess barriers to discharge/monitor patient progress toward functional and medical goals  Care Tool:  Bathing    Body parts bathed by patient: Right arm, Left arm, Chest, Abdomen, Front perineal area, Buttocks, Right upper leg, Left  upper leg, Right lower leg, Left lower leg, Face   Body parts bathed by helper: Front perineal area, Buttocks     Bathing assist Assist Level: Contact Guard/Touching assist     Upper Body Dressing/Undressing Upper body dressing   What is the patient wearing?: Pull over shirt    Upper body assist Assist Level: Set up assist    Lower Body Dressing/Undressing Lower body dressing      What is the patient wearing?: Underwear/pull up, Pants     Lower body assist Assist for lower body dressing: Minimal Assistance - Patient > 75%     Toileting Toileting    Toileting assist Assist for toileting: Supervision/Verbal cueing     Transfers Chair/bed transfer  Transfers assist     Chair/bed transfer assist level: Contact Guard/Touching assist     Locomotion Ambulation   Ambulation assist   Ambulation activity did not occur: Safety/medical concerns (severe back pain)  Assist level: Contact Guard/Touching assist Assistive device: Walker-rolling Max distance: 369ft   Walk 10 feet activity   Assist  Walk 10 feet activity did not occur: Safety/medical concerns (severe back pain)  Assist level: Contact Guard/Touching assist Assistive device: Walker-rolling   Walk 50 feet activity   Assist Walk 50 feet with 2 turns activity did not occur: Safety/medical concerns (severe back pain)  Assist level: Contact Guard/Touching assist Assistive device: Walker-rolling    Walk 150 feet activity   Assist Walk 150 feet activity did not occur: Safety/medical concerns (severe back pain)  Assist level: Contact Guard/Touching assist Assistive device: Walker-rolling  Walk 10 feet on uneven surface  activity   Assist Walk 10 feet on uneven surfaces activity did not occur: Safety/medical concerns (severe back pain)         Wheelchair     Assist Will patient use wheelchair at discharge?:  (TBD) Type of Wheelchair: Manual    Wheelchair assist level: Contact  Guard/Touching assist Max wheelchair distance: 159ft    Wheelchair 50 feet with 2 turns activity    Assist        Assist Level: Independent   Wheelchair 150 feet activity     Assist     Assist Level: Minimal Assistance - Patient > 75%      Medical Problem List and Plan: 1.Left sided weakness and visual-spatial, cognitive deficitssecondary to right occipital ICH (hypertensive vs amyloid angiopathy). Pt with some cognitive decline prior to hospitalization as well. Left homonymous hemianopsia   Continue CIR 2. Antithrombotics: -DVT/anticoagulation:Pharmaceutical:Lovenoxadded -antiplatelet therapy: N/A due to bleed. 3.Chronic LBP/Headaches/Pain Management: Tylenol prn  Vascular HA post ICH, added topiramate  Controlled on 6/10 4. Mood:LCSW to follow for evaluation and support. -antipsychotic agents: N/a 5. Neuropsych: This patientis not fullycapable of making decisions on herown behalf. 6. Skin/Wound Care:Routine pressure relief measures. 7. Fluids/Electrolytes/Nutrition:Monitor I/O.  8. HTN: Monitor BP -continue metoprolol.   Lisinopril increased to 20 mg on 06/01, increased to 30 on 6/6   Labile on 6/10, monitor for trend  Late entry ECG reviewed  Monitor with increased mobility Vitals:   06/28/19 2003 06/29/19 0454  BP: (!) 170/64 (!) 117/52  Pulse: 72 75  Resp: 20 14  Temp: 98.3 F (36.8 C) 98.2 F (36.8 C)  SpO2: 100% 99%   9. T2DM with retinopathy: Hgb A1c-8.6. Was on Amaryl, NPH36u am/6u pmand regular insulins--23/23/27at home. Will continue to monitor BS ac/hs.   Levemir bid increased to 13 twice daily on 6/5 CBG (last 3)  Recent Labs    06/28/19 2038 06/28/19 2202 06/29/19 0624  GLUCAP 144* 163* 201*   Amaral started on 6/8  Remains elevated on 6/10, will consider further increase in medications tomorrow 10. FUO: Resolved Encourage IS.   WBC is within normal notes Dopplers  limited, but negative for DVT  CXR personally reviewed, unremarkable  UA unremarkable, Ucx multiple species 10. ABLA: Likely due to bleed.   Hb 10.1 on 6/7  Cont to monitor 11. Macrocytosis:  Vitamin B12 levels low on 6/4  Supplement initiated 12.  AKI on CKD: Baseline SCr- 1.5-1.6.   Cr.  1.63 on 6/10  Echo reviewed, EF of 60-65%  IVF nightly x3 nights started on 6/10  Encourage fluids  Cont to monitor 13. Interstitial lung disease:IS/OOB 14. Anxiety disorder:On Zoloft. Will continue Seroquel and Klonopin at bedtime. 15.Dyslipidemia: Statin started due to SATURN trial.  16. Hypoalbuminemia  Supplement initiated on 6/4  LOS: 7 days A FACE TO FACE EVALUATION WAS PERFORMED  Sandra [name] Lorie Phenix 06/29/2019, 8:39 AM

## 2019-06-29 NOTE — Progress Notes (Signed)
Patient ID: Sandra [name], female   DOB: August 11, 1933, 86 y.o.   MRN: [id number]   Rolling Walker and [facility] order placed

## 2019-06-29 NOTE — Progress Notes (Signed)
Speech Language Pathology Daily Session Note  Patient Details  Name: Sandra [name] MRN: [id number] Date of Birth: 1933/03/29  Today's Date: 06/29/2019 SLP Individual Time: 0730-0827 SLP Individual Time Calculation (min): 57 min  Short Term Goals: Week 1: SLP Short Term Goal 1 (Week 1): Patient will demonstrate selective attention to functional tasks in a mildly distracting enviornment for 30 minutes with supervision verbal cues for redirection. SLP Short Term Goal 2 (Week 1): Patient will attend to left field of enviornment during functional tasks with Min verbal cues. SLP Short Term Goal 3 (Week 1): Patient will demonstrate functional problem solving for mildly complex and familiar tasks with Min verbal cues. SLP Short Term Goal 4 (Week 1): Patient will recall new, daily information with Min verbal and visual cues. SLP Short Term Goal 5 (Week 1): Patient will self-monitor and correct errors during functional tasks with Min verbal cues.  Skilled Therapeutic Interventions: Pt was seen for skilled ST targeting cognition. Pt noted to have some increase in confusion/mental fog this morning requiring increased cueing for use of aids to orient to time and other skills throughout session. Pt required Mod A verbal and visual cues for organization, recall, and fluctuating Min-Mod A for error awareness and problem solving when using list of current medications to organize a BID pill box. Overall Min A verbal cues required for redirection to tasks due to internal distractions. Pt left in bed with alarm set and needs within reach. Continue per current plan of care.       Pain Pain Assessment Pain Scale: 0-10 Pain Score: 0-No pain   Therapy/Group: Individual Therapy  Arbutus Leas 06/29/2019, 7:06 AM

## 2019-06-30 ENCOUNTER — Inpatient Hospital Stay (HOSPITAL_COMMUNITY): Payer: Medicare HMO | Admitting: Physical Therapy

## 2019-06-30 ENCOUNTER — Inpatient Hospital Stay (HOSPITAL_COMMUNITY): Payer: Medicare HMO | Admitting: Occupational Therapy

## 2019-06-30 ENCOUNTER — Inpatient Hospital Stay (HOSPITAL_COMMUNITY): Payer: Medicare HMO | Admitting: Speech Pathology

## 2019-06-30 LAB — GLUCOSE, CAPILLARY
Glucose-Capillary: 198 mg/dL — ABNORMAL HIGH (ref 70–99)
Glucose-Capillary: 275 mg/dL — ABNORMAL HIGH (ref 70–99)
Glucose-Capillary: 160 mg/dL — ABNORMAL HIGH (ref 70–99)
Glucose-Capillary: 185 mg/dL — ABNORMAL HIGH (ref 70–99)

## 2019-06-30 MED ORDER — GLIMEPIRIDE 2 MG PO TABS
2.0000 mg | ORAL_TABLET | Freq: Every day | ORAL | Status: DC
  Administered 2019-06-30 – 2019-07-05 (×6): 2 mg via ORAL
  Filled 2019-06-30 (×5): qty 1

## 2019-06-30 NOTE — Plan of Care (Signed)
  Problem: Consults Goal: RH STROKE PATIENT EDUCATION Description: See Patient Education module for education specifics  Outcome: Progressing Goal: Diabetes Guidelines if Diabetic/Glucose > 140 Description: If diabetic or lab glucose is > 140 mg/dl - Initiate Diabetes/Hyperglycemia Guidelines & Document Interventions  Outcome: Progressing   Problem: RH BOWEL ELIMINATION Goal: RH STG MANAGE BOWEL WITH ASSISTANCE Description: STG Manage Bowel with min Assistance. Outcome: Progressing Goal: RH STG MANAGE BOWEL W/MEDICATION W/ASSISTANCE Description: STG Manage Bowel with Medication with min Assistance. Outcome: Progressing   Problem: RH SKIN INTEGRITY Goal: RH STG SKIN FREE OF INFECTION/BREAKDOWN Description: Patient will not have any break down while on CIR Outcome: Progressing Goal: RH STG MAINTAIN SKIN INTEGRITY WITH ASSISTANCE Description: STG Maintain Skin Integrity With min Assistance. Outcome: Progressing   Problem: RH SAFETY Goal: RH STG ADHERE TO SAFETY PRECAUTIONS W/ASSISTANCE/DEVICE Description: STG Adhere to Safety Precautions With min Assistance/Device. Outcome: Progressing   Problem: RH COGNITION-NURSING Goal: RH STG USES MEMORY AIDS/STRATEGIES W/ASSIST TO PROBLEM SOLVE Description: STG Uses Memory Aids/Strategies With min Assistance to Problem Solve. Outcome: Progressing   Problem: RH PAIN MANAGEMENT Goal: RH STG PAIN MANAGED AT OR BELOW PT'S PAIN GOAL Description: Pain scale goal <3/10 Outcome: Progressing   Problem: RH KNOWLEDGE DEFICIT Goal: RH STG INCREASE KNOWLEDGE OF DIABETES Description: Patient will be able to verbalize steps in controlling blood sugar levels using handouts/resources and minimal assistance Outcome: Progressing Goal: RH STG INCREASE KNOWLEDGE OF HYPERTENSION Description: Patient will be able to verbalize the risks of hypertension using handouts and resources with minimal assistance Outcome: Progressing Goal: RH STG INCREASE KNOWLEDGE OF  STROKE PROPHYLAXIS Description: Patient will be able to verbalize medications for stroke prophylaxis using handouts/resources with minimal assistance Outcome: Progressing   Problem: RH Vision Goal: RH LTG Vision (Specify) Outcome: Progressing

## 2019-06-30 NOTE — Progress Notes (Signed)
Speech Language Pathology Weekly Progress and Session Note  Patient Details  Name: Sandra [name] MRN: [id number] Date of Birth: 24-Feb-1933  Beginning of progress report period: June 23, 2019 End of progress report period: June 30, 2019  Today's Date: 06/30/2019 SLP Individual Time: 1415-1500 SLP Individual Time Calculation (min): 45 min  Short Term Goals: Week 1: SLP Short Term Goal 1 (Week 1): Patient will demonstrate selective attention to functional tasks in a mildly distracting enviornment for 30 minutes with supervision verbal cues for redirection. SLP Short Term Goal 1 - Progress (Week 1): Progressing toward goal SLP Short Term Goal 2 (Week 1): Patient will attend to left field of enviornment during functional tasks with Min verbal cues. SLP Short Term Goal 2 - Progress (Week 1): Met SLP Short Term Goal 3 (Week 1): Patient will demonstrate functional problem solving for mildly complex and familiar tasks with Min verbal cues. SLP Short Term Goal 3 - Progress (Week 1): Met SLP Short Term Goal 4 (Week 1): Patient will recall new, daily information with Min verbal and visual cues. SLP Short Term Goal 4 - Progress (Week 1): Met SLP Short Term Goal 5 (Week 1): Patient will self-monitor and correct errors during functional tasks with Min verbal cues. SLP Short Term Goal 5 - Progress (Week 1): Met    New Short Term Goals: Week 2: SLP Short Term Goal 1 (Week 2): STG=LTG due to remaining LOS  Weekly Progress Updates: Pt has made steady functional gains and met 4 out of 5 short term goals this reporting period. Of note, pt has presented with occasional increases in confusion, during which she requires additional cues for recall, attention, and problem solving, but most typically pt required Min A for basic to mildly complex cognitive tasks. Pt has demonstrated improved sustained attention and functional problem solving, as well as error awareness and use of aids for recall of new information.  Pt education is ongoing; no family has been present for ST sessions. Pt would continue to benefit from skilled ST while inpatient in order to maximize functional independence and reduce burden of care prior to discharge. Anticipate that pt will need 24/7 supervision at discharge in addition to [place] follow up at next level of care.       Intensity: Minumum of 1-2 x/day, 30 to 90 minutes Frequency: 3 to 5 out of 7 days Duration/Length of Stay: 07/05/19 Treatment/Interventions: Cognitive remediation/compensation;Internal/external aids;Therapeutic Activities;Environmental controls;Cueing hierarchy;Functional tasks;Patient/family education   Daily Session  Skilled Therapeutic Interventions: Pt was seen for skilled ST targeting cognitive goals. Pt continues to present with fluctuations in confusion which resulted in need for Moderate verbal and visual cues for functional problem solving, Min A for emergent awareness during a complex medication management task.  She was also internally distracted throughout session, requiring Min A verbal cues for redirection and Mod A for working memory throughout session. She verbalized good awareness that medication management task continues to present increased levels of difficulty for her today, and that she will need assistance from family members with medication management at home. During session pt reported anxious feelings and desire to take one of her anxiety medications. She required Moderate cues to recall that she could request that medication, and Min A to functionally problem solve how to request it with use of call bell. Pt left laying in bed with alarm set and needs within reach. Continue per current plan of care.          Pain Pain Assessment Pain Scale:  Faces Pain Score: 0-No pain Faces Pain Scale: No hurt   Therapy/Group: Individual Therapy  Arbutus Leas 06/30/2019, 7:11 AM

## 2019-06-30 NOTE — Progress Notes (Signed)
[facility] PHYSICAL MEDICINE & REHABILITATION PROGRESS NOTE  Subjective/Complaints: Patient seen sitting up this AM.  She states she slept well overnight.  She is slightly anxious this AM.    ROS: Denies CP, SOB, N/V/D  Objective: Vital Signs: Blood pressure (!) 144/63, pulse 78, temperature 97.6 F (36.4 C), resp. rate 20, height 5' (1.524 m), weight 64.1 kg, SpO2 100 %. No results found. No results for input(s): WBC, HGB, HCT, PLT in the last 72 hours. Recent Labs    06/29/19 0639  NA 139  K 3.5  CL 107  CO2 22  GLUCOSE 207*  BUN 62*  CREATININE 1.63*  CALCIUM 9.0    Physical Exam: BP (!) 144/63 (BP Location: Right Arm)   Pulse 78   Temp 97.6 F (36.4 C)   Resp 20   Ht 5' (1.524 m)   Wt 64.1 kg   SpO2 100%   BMI 27.60 kg/m  Constitutional: No distress . Vital signs reviewed. HENT: Normocephalic.  Atraumatic. Eyes: EOMI. No discharge. Cardiovascular: No JVD. Respiratory: Normal effort.  No stridor. GI: Non-distended. Skin: Warm and dry.  Intact. Psych: Normal mood.  Normal behavior. Musc: No edema in extremities.  No tenderness in extremities. Neurologic: Alert Motor: RUE/RLE: 5/5 proximal distal LUE: 4+-5/5 proximal distal LLE: 4+-5/5 proximal distal Left homonymous hemianopsia, ?  Improving Left upper extremity dysmetria, improving  Assessment/Plan: 1. Functional deficits secondary to right occipital ICH which require 3+ hours per day of interdisciplinary therapy in a comprehensive inpatient rehab setting.  Physiatrist is providing close team supervision and 24 hour management of active medical problems listed below.  Physiatrist and rehab team continue to assess barriers to discharge/monitor patient progress toward functional and medical goals  Care Tool:  Bathing    Body parts bathed by patient: Right arm, Left arm, Chest, Abdomen, Front perineal area, Buttocks, Right upper leg, Left upper leg, Right lower leg, Left lower leg, Face   Body parts  bathed by helper: Front perineal area, Buttocks     Bathing assist Assist Level: Contact Guard/Touching assist     Upper Body Dressing/Undressing Upper body dressing   What is the patient wearing?: Pull over shirt    Upper body assist Assist Level: Set up assist    Lower Body Dressing/Undressing Lower body dressing      What is the patient wearing?: Underwear/pull up, Pants     Lower body assist Assist for lower body dressing: Minimal Assistance - Patient > 75%     Toileting Toileting    Toileting assist Assist for toileting: Supervision/Verbal cueing     Transfers Chair/bed transfer  Transfers assist     Chair/bed transfer assist level: Supervision/Verbal cueing     Locomotion Ambulation   Ambulation assist   Ambulation activity did not occur: Safety/medical concerns (severe back pain)  Assist level: Supervision/Verbal cueing Assistive device: Walker-rolling Max distance: 125ft   Walk 10 feet activity   Assist  Walk 10 feet activity did not occur: Safety/medical concerns (severe back pain)  Assist level: Supervision/Verbal cueing Assistive device: Walker-rolling   Walk 50 feet activity   Assist Walk 50 feet with 2 turns activity did not occur: Safety/medical concerns (severe back pain)  Assist level: Supervision/Verbal cueing Assistive device: Walker-rolling    Walk 150 feet activity   Assist Walk 150 feet activity did not occur: Safety/medical concerns (severe back pain)  Assist level: Supervision/Verbal cueing Assistive device: Walker-rolling    Walk 10 feet on uneven surface  activity   Assist  Walk 10 feet on uneven surfaces activity did not occur: Safety/medical concerns (severe back pain)         Wheelchair     Assist Will patient use wheelchair at discharge?:  (TBD) Type of Wheelchair: Manual    Wheelchair assist level: Contact Guard/Touching assist Max wheelchair distance: 161ft    Wheelchair 50 feet with 2  turns activity    Assist        Assist Level: Independent   Wheelchair 150 feet activity     Assist     Assist Level: Minimal Assistance - Patient > 75%      Medical Problem List and Plan: 1.Left sided weakness and visual-spatial, cognitive deficitssecondary to right occipital ICH (hypertensive vs amyloid angiopathy). Pt with some cognitive decline prior to hospitalization as well. Left homonymous hemianopsia   Continue CIR 2. Antithrombotics: -DVT/anticoagulation:Pharmaceutical:Lovenoxadded -antiplatelet therapy: N/A due to bleed. 3.Chronic LBP/Headaches/Pain Management: Tylenol prn  Vascular HA post ICH, added topiramate  Controlled on 6/11 4. Mood:LCSW to follow for evaluation and support. -antipsychotic agents: N/a 5. Neuropsych: This patientis not fullycapable of making decisions on herown behalf. 6. Skin/Wound Care:Routine pressure relief measures. 7. Fluids/Electrolytes/Nutrition:Monitor I/O.  8. HTN: Monitor BP -continue metoprolol.   Lisinopril increased to 20 mg on 06/01, increased to 30 on 6/6   Improving control on 6/11  Late entry ECG reviewed  Monitor with increased mobility Vitals:   06/29/19 1942 06/30/19 0353  BP: (!) 141/45 (!) 144/63  Pulse: 69 78  Resp: 18 20  Temp: 99.2 F (37.3 C) 97.6 F (36.4 C)  SpO2: 98% 100%   9. T2DM with retinopathy: Hgb A1c-8.6. Was on Amaryl, NPH36u am/6u pmand regular insulins--23/23/27at home. Will continue to monitor BS ac/hs.   Levemir bid increased to 13 twice daily on 6/5 CBG (last 3)  Recent Labs    06/29/19 1647 06/29/19 2128 06/30/19 0600  GLUCAP 201* [place] started on 6/8, increased on 6/11 10. FUO: Resolved Encourage IS.   WBC is within normal notes Dopplers limited, but negative for DVT  CXR personally reviewed, unremarkable  UA unremarkable, Ucx multiple species 10. ABLA: Likely due to bleed.   Hb 10.1 on  6/7  Cont to monitor 11. Macrocytosis:  Vitamin B12 levels low on 6/4  Supplement initiated 12.  AKI on CKD: Baseline SCr- 1.5-1.6.   Cr.  1.63 on 6/10, labs ordered for Monday  Echo reviewed, EF of 60-65%  IVF nightly x3 nights started on 6/10  Encourage fluids  Cont to monitor 13. Interstitial lung disease:IS/OOB 14. Anxiety disorder:On Zoloft. Will continue Seroquel and Klonopin at bedtime. 15.Dyslipidemia: Statin started due to SATURN trial, discussed with Neurology.  16. Hypoalbuminemia  Supplement initiated on 6/4  LOS: 8 days A FACE TO FACE EVALUATION WAS PERFORMED  Eddy Termine Lorie Phenix 06/30/2019, 8:48 AM

## 2019-06-30 NOTE — Evaluation (Signed)
Recreational Therapy Assessment and Plan  Patient Details  Name: Ineze Serrao MRN: [id number] Date of Birth: Jul 06, 1933 Today's Date: 06/30/2019  Rehab Potential:  Good ELOS:   d/c 6/16  Assessment   Problem List:      Patient Active Problem List   Diagnosis Date Noted   Hypoalbuminemia due to protein-calorie malnutrition ([place])    Macrocytosis    Acute blood loss anemia    FUO (fever of unknown origin)    Diabetic retinopathy of left eye associated with type 2 diabetes mellitus ([place])    Cognitive impairment 06/22/2019   Lobar cerebral hemorrhage ([place]) 06/22/2019   ICH (intracerebral hemorrhage) (HCC) - R occipital - HTN vs CAA 06/18/2019   TIA (transient ischemic attack) 04/26/2019   Diarrhea 04/24/2019   Severe hypertension 03/18/2018   Encephalopathy, hypertensive    Acute encephalopathy 03/17/2018   Scleroderma ([place]) 02/28/2016   Dyspnea 02/10/2016   Abnormal blood finding 02/10/2016   CAD (coronary artery disease) 02/10/2016   Osteoarthritis 02/10/2016   Nodule on liver 12/06/2015   ILD (interstitial lung disease) ([place]) 05/31/2015   Type 2 diabetes mellitus with hyperglycemia ([place]) 03/11/2015   Diabetes mellitus type 2 with retinopathy ([place]) 03/04/2015   Vitamin D deficiency 10/01/2014   Leukocytopenia 05/31/2014   Chronic kidney disease (CKD), stage III (moderate) ([place]) 05/31/2014   Fatigue 05/31/2014   Lower abdominal pain 05/17/2014   Nausea with vomiting 09/22/2013   Chronic right SI joint pain 09/22/2013   Vasculitis of skin 09/28/2012   Abrasion of ear canal 09/28/2012   History of subarachnoid hemorrhage 04/07/2012   Unspecified cerebral artery occlusion with cerebral infarction 04/07/2012   UTI (urinary tract infection) 11/02/2011   General medical examination 01/07/2011   Chest pain 11/27/2010   Achilles tendon injury 11/27/2010   Back pain 07/01/2010   Breast pain 04/15/2010   GAIT DISTURBANCE  08/07/2009   Anxiety state 07/03/2009   VALVULAR HEART DISEASE 05/23/2008   NECK PAIN, CHRONIC 04/03/2008   DEGENERATIVE JOINT DISEASE, CERVICAL SPINE 06/23/2006   Osteoporosis 06/23/2006   Hyperlipidemia associated with type 2 diabetes mellitus ([place]) 05/12/2006   DEPRESSION 05/12/2006   Essential hypertension 05/12/2006   ALLERGIC RHINITIS 05/12/2006   ACID REFLUX DISEASE 05/12/2006   SCIATICA 05/12/2006    Past Medical History:      Past Medical History:  Diagnosis Date   Abnormal blood finding 02/10/2016   Abrasion of ear canal 09/28/2012   Achilles tendon injury 11/27/2010   ACID REFLUX DISEASE 05/12/2006   Qualifier: Diagnosis of  By: Larose Kells MD, Alda Berthold.    Acute MI [facility])    ALLERGIC RHINITIS 05/12/2006   Qualifier: Diagnosis of  By: Larose Kells MD, [place]    Anxiety state 07/03/2009   Qualifier: Diagnosis of  By: Larose Kells MD, [place] pain 07/01/2010   Chronic kidney disease (CKD), stage III (moderate) (HCC) 05/31/2014   Chronic right SI joint pain 09/22/2013   DEGENERATIVE JOINT DISEASE, CERVICAL SPINE 06/23/2006   Annotation: had a CAT scan with a cervical myelogram that show  left C6-7 and  C5-6 foraminal stenosis Qualifier: Diagnosis of  By: Larose Kells MD, [place] 05/12/2006   Qualifier: Diagnosis of  By: Larose Kells MD, [place], TYPE II 05/12/2006   Now following w/ Endo at [facility] 04/27/2007   Qualifier: Diagnosis of  By: Jerold Coombe     Dyspnea 02/10/2016   Encephalopathy,  hypertensive    Essential hypertension 05/12/2006   Qualifier: Diagnosis of  By: Larose Kells MD, [place]    Fatigue 05/31/2014   GAIT DISTURBANCE 08/07/2009   Qualifier: Diagnosis of  By: Larose Kells MD, [place] E.    GERD (gastroesophageal reflux disease)    Headache(784.0) 11/27/2010   Hyperlipidemia    Hyperlipidemia associated with type 2 diabetes mellitus ([place]) 05/12/2006   Qualifier: Diagnosis of  By: Larose Kells MD, [place]    ILD  (interstitial lung disease) ([place]) 05/31/2015   Leukocytopenia 05/31/2014   Lower abdominal pain 05/17/2014   Nausea with vomiting 09/22/2013   NECK PAIN, CHRONIC 04/03/2008   Qualifier: Diagnosis of  By: Larose Kells MD, [place]    Nodule on liver 12/06/2015   Osteoarthritis 02/10/2016   Osteopenia    Osteoporosis 06/23/2006   Annotation: had a bone density test in 08-2004.  T score was -2.4 Qualifier: Diagnosis of  By: Larose Kells MD, Alda Berthold     [facility] (subarachnoid hemorrhage) ([place])    Sciatica    Scleroderma ([place]) 02/28/2016   Severe hypertension 03/18/2018   Slurred speech 11/27/2010   Takotsubo cardiomyopathy    s/p stress MI with stress induced CM with normal coronary arteries by cath 2007 with normalization of LVF by echo 04/2005   TIA (transient ischemic attack)    Unspecified cerebral artery occlusion with cerebral infarction 04/07/2012   UTI (urinary tract infection) 11/02/2011   Vasculitis of skin 09/28/2012   Vitamin D deficiency 10/01/2014   Past Surgical History:       Past Surgical History:  Procedure Laterality Date   ABDOMINAL HYSTERECTOMY  1977   no oophorectomy   APPENDECTOMY     CARDIAC CATHETERIZATION     CATARACT EXTRACTION, BILATERAL  2011   SPINAL FUSION  2000   Dr. Vertell Limber   TONSILLECTOMY      Assessment & Plan Clinical Impression:Elisandra Muma is an14 year old right-handed female with history of prior hemorrhagic stroke/TIA, T2DMwith retinopathy, CKD (Dr. Blanche East), ILD (Dr. Vaughan Browner), chronic LBP, fall 06/06/19 with mild injuries and recurrent fall on 06/17/19 with reports of persistent HA and right elbow pain. She presented to theED that evening but left due to wait time. She was admitted on 06/18/2019 with reports of confusion, headaches, dizziness and difficulty walking. She was found to have left hemianopsia with mild neglect and CT of head done revealing focal hemorrhage in middle occipital lobe likely from hemorrhagic infarct and  mild cytotoxic edema with small vessel disease. MRI brain done revealing hematoma right posterior temporoparietal lobe containing methemoglobin and extension into subdural speech with small SDH and evidence of prior microhemorrhages in left frontal and left parietal lobe likely due to hypertensive bleed.  Neurology question hypertension versus trauma versus amyloid as cause of bleed. Aspirin was discontinued and Cleviprex added to keep SBP less than 140. 2D echo done revealing EF 60 to 65% with grade 1 diastolic dysfunction and mild to moderate aortic sclerosis. MRA was negative for stenosis, aneurysm or vascular malformation.Dr. Erlinda Hong felt that liver bleed without secondary cause and felt that she was a good candidate for SATURNtrial.She did develop fever 101.2 early am and UA ordered for work up.She continues to have limitations due to headaches as well as chronic back pain, balance deficits, right gaze preference with left inattention and decreased awareness of deficits. CIR was recommended due to functional decline.  Patient transferred to CIR on 06/22/2019 .    Pt presents with decreased activity tolerance, decreased functional mobility, decreased balance, decreased coordination,  left inattention,  decreased safety awareness and decreased memory Limiting pt's independence with leisure/community pursuits.  Met with pt today to discuss leisure interest and community pursuits as pt was active out and about PTA.  Pt ambulated on outdoor uneven surfaces using RW with supervision and min cues for safety and visual scanning.    Plan  No further TR due to d/c 6/15  Recommendations for other services: None   Discharge Criteria: Patient will be discharged from TR if patient refuses treatment 3 consecutive times without medical reason.  If treatment goals not met, if there is a change in medical status, if patient makes no progress towards goals or if patient is discharged from hospital.  The above  assessment, treatment plan, treatment alternatives and goals were discussed and mutually agreed upon: by patient  [place] 06/30/2019, 12:55 PM

## 2019-06-30 NOTE — Plan of Care (Signed)
  Problem: RH Problem Solving Goal: LTG Patient will demonstrate problem solving for (SLP) Description: LTG:  Patient will demonstrate problem solving for basic/complex daily situations with cues  (SLP) Flowsheets (Taken 06/30/2019 1505) LTG: Patient will demonstrate problem solving for (SLP): (semi-complex) Other (comment) LTG Patient will demonstrate problem solving for: Minimal Assistance - Patient > 75% Note: Downgraded due to fluctuating levels of confusion that have caused inconsistent progress   Problem: RH Memory Goal: LTG Patient will use memory compensatory aids to (SLP) Description: LTG:  Patient will use memory compensatory aids to recall biographical/new, daily complex information with cues (SLP) Flowsheets (Taken 06/30/2019 1505) LTG: Patient will use memory compensatory aids to (SLP): Minimal Assistance - Patient > 75% Note: Downgraded due to fluctuating levels of confusion that have caused inconsistent progress   Problem: RH Attention Goal: LTG Patient will demonstrate this level of attention during functional activites (SLP) Description: LTG:  Patient will will demonstrate this level of attention during functional activites (SLP) Flowsheets (Taken 06/30/2019 1505) LTG: Patient will demonstrate this level of attention during cognitive/linguistic activities with assistance of (SLP): Minimal Assistance - Patient > 75% Number of minutes patient will demonstrate attention during cognitive/linguistic activities: 15 Note: Downgraded due to fluctuating levels of confusion that have caused inconsistent progress   Problem: RH Awareness Goal: LTG: Patient will demonstrate awareness during functional activites type of (SLP) Description: LTG: Patient will demonstrate awareness during functional activites type of (SLP) Flowsheets (Taken 06/30/2019 1505) LTG: Patient will demonstrate awareness during cognitive/linguistic activities with assistance of (SLP): Minimal Assistance - Patient >  75% Note: Downgraded due to fluctuating levels of confusion that have caused inconsistent progress    Goals were downgraded due to fluctuating levels of confusion that resulted in inconsistent progress, particularly toward the end of this week.

## 2019-06-30 NOTE — Progress Notes (Addendum)
Occupational Therapy Weekly Progress Note  Patient Details  Name: Sandra [name] MRN: [id number] Date of Birth: May 31, 1933  Beginning of progress report period: June 23, 2019 End of progress report period: June 30, 2019  Today's Date: 06/30/2019 OT Individual Time: [phone] and 1130-1206 OT Individual Time Calculation (min): 54 min and 36 min    Patient has met 4 of 4 short term goals.  Pt is making steady progress and exhibits improved left sided awareness and improved sitting and standing balance.  Pt presented with intermittent confusion and persistent low back pain this week which acted as mild barriers to progress. Pt will need 24/7 supervision due to confusion upon dc to home.  Pt needs sitting rest breaks during functional tasks due to back pain, however pt reports this was her baseline performance at home.  Pt needs assist for balance to transfer in and out of tub shower using shower chair, same as home setup and will need caregiver education to ensure pt able to complete safely with assist.  Pt exhibits a relatively strong fear of falling in shower with apprehensiveness. Pt needing encouragement and reinforcement of transfer technique and bathing simulation in tub shower this week but is now receptive to attempting full bathing in tub/shower with assist.    Patient continues to demonstrate the following deficits: muscle weakness, decreased visual perceptual skills, decreased awareness, decreased problem solving, decreased safety awareness and decreased memory and decreased sitting balance and decreased standing balance and therefore will continue to benefit from skilled OT intervention to enhance overall performance with BADL.  Patient progressing toward long term goals..  Continue plan of care.  OT Short Term Goals Week 1:  OT Short Term Goal 1 (Week 1): Pt will be able to sit to stand at toilet with CGA. OT Short Term Goal 2 (Week 1): Pt will complete stand pivot to toilet with CGA. OT  Short Term Goal 3 (Week 1): Pt will demonstrate improved L side awareness by donning shirt with verbal cues only. OT Short Term Goal 4 (Week 1): Pt will be able to pull pants over hips with CGA for balance.   Week 2:  PT Short Term Goal 1 (Week 2): STG = LTG due to remaining ELOS  Skilled Therapeutic Interventions/Progress Updates:    First session:  Pt in supine with heating pad applied to low back, reporting no low back pain at this time.  Pt reporting she feels relatively clean but would like to brush her teeth at sink.  Pt attempting to sit up with poor body mechanics, therefore educated pt on improved technique to reduce pain and strain in back.  Pt completed with good return demo with supervision.  Pt ambulated to sink using RW with CGA.  Pt brushed teeth, combed hair, and washed face in standing with no LOB noted and able to attend to left side with no cueing needed.  Pt ambulated back to EOB and doffed/donned shirt needing only setup and doffed/donned pants with CGA.  Improved ability to thread pants over feet without back pain today.  Pt requesting to toilet reporting her stomach felt upset.  Pt noted with incontinence of bowel episode and needing mod assist to complete pericare and clothing mgt at toilet.  Pt provided clean brief and pants and donned with CGA.  Pt returned to w/c by ambulating from bathroom to bedside using RW with CGA.  Pt left with PT at side.  Second session: Pt sitting in w/c with no complaints of pain.  Pt reporting feeling very afraid to take a shower because she is scared she might fall.  Pt educated on safety techniques and compensatory strategies that can reduce risk for falls and increase safety.  Pt agreeable to trying tub shower transfer and simulation of UB/LB bathing to increase safety awareness, independence, and confidence level.  Pt ambulated approx 5 feet from bedroom to bathroom and step over tub lip transfer to shower chair needing CGA without use of grab bars  secondary to pt does not have in home bathroom.  Pt simulated bathing UB and LB with supervision.  Pt educated on safe transfer technique from shower chair to RW stepping over tub lip. Pt return demonstrated with CGA.  Pt returned to w/c and in room with seat belt alarm on, call bell in reach.    Therapy Documentation Precautions:  Precautions Precautions: Fall Precaution Comments: L hemianopsia, L inattention, apraxia, severe back pain (would use back precautions for comfort) Restrictions Weight Bearing Restrictions: No   Pain: Pain Assessment Pain Scale: Faces Pain Score: 0-No pain Faces Pain Scale: No hurt ADL: ADL Eating: Set up Grooming: Setup Upper Body Bathing: Setup Where Assessed-Upper Body Bathing: Sitting at sink Lower Body Bathing: Moderate assistance Where Assessed-Lower Body Bathing: Sitting at sink Upper Body Dressing: Moderate assistance Where Assessed-Upper Body Dressing: Sitting at sink Lower Body Dressing: Moderate assistance Where Assessed-Lower Body Dressing: Sitting at sink Toileting: Maximal assistance Where Assessed-Toileting: Glass blower/designer: Moderate assistance Toilet Transfer Method: Stand pivot Toilet Transfer Equipment: Grab bars   Therapy/Group: Individual Therapy  Ezekiel Slocumb 06/30/2019, 5:58 PM

## 2019-06-30 NOTE — Progress Notes (Signed)
Physical Therapy Weekly Progress Note  Patient Details  Name: Sandra [name] MRN: [id number] Date of Birth: 27-Dec-1933  Beginning of progress report period: June 23, 2019 End of progress report period: June 30, 2019  Today's Date: 06/30/2019 PT Individual Time: 1005-1100 PT Individual Time Calculation (min): 55 min   Patient has met 3 of 3 short term goals.  She has done very well physically with therapy, however continues to have waxing and waning bouts of confusion- some sessions she seems more clear than others, and some days she is VERY confused. Will certainly need 81/1B for safety with discharge as planned.   Patient continues to demonstrate the following deficits muscle weakness, decreased cardiorespiratoy endurance, decreased coordination and decreased motor planning, decreased attention to right and decreased motor planning, decreased attention, decreased awareness, decreased problem solving, decreased safety awareness, decreased memory and delayed processing and decreased standing balance and decreased balance strategies and therefore will continue to benefit from skilled PT intervention to increase functional independence with mobility.  Patient progressing toward long term goals..  Continue plan of care.  PT Short Term Goals Week 1:  PT Short Term Goal 1 (Week 1): Patient will be able to perform all bed mobility with modA consistently PT Short Term Goal 1 - Progress (Week 1): Met PT Short Term Goal 2 (Week 1): Patient to require no more than ModA for functional transfers PT Short Term Goal 2 - Progress (Week 1): Met PT Short Term Goal 3 (Week 1): Patient to initiate gait training PT Short Term Goal 3 - Progress (Week 1): Met Week 2:  PT Short Term Goal 1 (Week 2): STG = LTG due to remaining ELOS  Skilled Therapeutic Interventions/Progress Updates:    Patient received up in [facility] with OT, pleasant and willing to participate in therapy today but still a bit down. Continued practicing  single step navigation with RW and min guard fading to S with repetition, and also performed 8 steps with B rails and S today but very fatigued; also went outside and practiced walking over uneven sidewalks and hills/slopes as well as crossing the road with max cues to turn her head to the right for safety with this crossing. Otherwise able to perform multiple functional transfers with S/RW, and practiced balance based activities including tandem stance on level floor, marching on blue foam pad, and holding balance with no UEs on blue foam pad with MinA for balance without BUE support. Left up in [facility] with all needs met, alarm seatbelt active this morning.   Therapy Documentation Precautions:  Precautions Precautions: Fall Precaution Comments: L hemianopsia, L inattention, apraxia, severe back pain (would use back precautions for comfort) Restrictions Weight Bearing Restrictions: No    Pain: Pain Assessment Pain Scale: Faces Faces Pain Scale: Hurts a little bit Pain Type: Chronic pain Pain Location: Back Pain Orientation: Lower Pain Descriptors / Indicators: Aching;Sore Pain Onset: On-going Patients Stated Pain Goal: 0    Therapy/Group: Individual Therapy   Windell Norfolk, DPT, PN1   Supplemental Physical Therapist [place]    Pager [phone] Acute Rehab Office [phone]    06/30/2019, 12:36 PM

## 2019-07-01 ENCOUNTER — Inpatient Hospital Stay (HOSPITAL_COMMUNITY): Payer: Medicare HMO | Admitting: Physical Therapy

## 2019-07-01 LAB — GLUCOSE, CAPILLARY
Glucose-Capillary: 169 mg/dL — ABNORMAL HIGH (ref 70–99)
Glucose-Capillary: 198 mg/dL — ABNORMAL HIGH (ref 70–99)
Glucose-Capillary: 130 mg/dL — ABNORMAL HIGH (ref 70–99)
Glucose-Capillary: 120 mg/dL — ABNORMAL HIGH (ref 70–99)

## 2019-07-01 NOTE — Plan of Care (Signed)
  Problem: Consults Goal: RH STROKE PATIENT EDUCATION Description: See Patient Education module for education specifics  Outcome: Progressing Goal: Diabetes Guidelines if Diabetic/Glucose > 140 Description: If diabetic or lab glucose is > 140 mg/dl - Initiate Diabetes/Hyperglycemia Guidelines & Document Interventions  Outcome: Progressing   Problem: RH BOWEL ELIMINATION Goal: RH STG MANAGE BOWEL WITH ASSISTANCE Description: STG Manage Bowel with min Assistance. Outcome: Progressing Goal: RH STG MANAGE BOWEL W/MEDICATION W/ASSISTANCE Description: STG Manage Bowel with Medication with min Assistance. Outcome: Progressing   Problem: RH SKIN INTEGRITY Goal: RH STG SKIN FREE OF INFECTION/BREAKDOWN Description: Patient will not have any break down while on CIR Outcome: Progressing Goal: RH STG MAINTAIN SKIN INTEGRITY WITH ASSISTANCE Description: STG Maintain Skin Integrity With min Assistance. Outcome: Progressing   Problem: RH SAFETY Goal: RH STG ADHERE TO SAFETY PRECAUTIONS W/ASSISTANCE/DEVICE Description: STG Adhere to Safety Precautions With min Assistance/Device. Outcome: Progressing   Problem: RH COGNITION-NURSING Goal: RH STG USES MEMORY AIDS/STRATEGIES W/ASSIST TO PROBLEM SOLVE Description: STG Uses Memory Aids/Strategies With min Assistance to Problem Solve. Outcome: Progressing   Problem: RH PAIN MANAGEMENT Goal: RH STG PAIN MANAGED AT OR BELOW PT'S PAIN GOAL Description: Pain scale goal <3/10 Outcome: Progressing   Problem: RH KNOWLEDGE DEFICIT Goal: RH STG INCREASE KNOWLEDGE OF DIABETES Description: Patient will be able to verbalize steps in controlling blood sugar levels using handouts/resources and minimal assistance Outcome: Progressing Goal: RH STG INCREASE KNOWLEDGE OF HYPERTENSION Description: Patient will be able to verbalize the risks of hypertension using handouts and resources with minimal assistance Outcome: Progressing Goal: RH STG INCREASE KNOWLEDGE OF  STROKE PROPHYLAXIS Description: Patient will be able to verbalize medications for stroke prophylaxis using handouts/resources with minimal assistance Outcome: Progressing   Problem: RH Vision Goal: RH LTG Vision (Specify) Outcome: Progressing

## 2019-07-01 NOTE — Progress Notes (Signed)
Physical Therapy Session Note  Patient Details  Name: Sandra [name] MRN: [id number] Date of Birth: 10-May-1933  Today's Date: 07/01/2019   Short Term Goals: Week 1:  PT Short Term Goal 1 (Week 1): Patient will be able to perform all bed mobility with modA consistently PT Short Term Goal 1 - Progress (Week 1): Met PT Short Term Goal 2 (Week 1): Patient to require no more than ModA for functional transfers PT Short Term Goal 2 - Progress (Week 1): Met PT Short Term Goal 3 (Week 1): Patient to initiate gait training PT Short Term Goal 3 - Progress (Week 1): Met Week 2:  PT Short Term Goal 1 (Week 2): STG = LTG due to remaining ELOS  Skilled Therapeutic Interventions/Progress Updates:   Pt reports feeling very "sick" this morning and refuses therapy. Pt unable to clarify when asked to describe. Left in bed with call bel lin reach and all needs met.      Therapy Documentation Precautions:  Precautions Precautions: Fall Precaution Comments: L hemianopsia, L inattention, apraxia, severe back pain (would use back precautions for comfort) Restrictions Weight Bearing Restrictions: No General: PT Amount of Missed Time (min): 60 Minutes PT Missed Treatment Reason: Patient ill (Comment) Vital Signs: Therapy Vitals Temp: 98.6 F (37 C) Pulse Rate: 63 Resp: 20 BP: (!) 117/57 Patient Position (if appropriate): Lying Oxygen Therapy SpO2: 97 % O2 Device: Room Air Pain: Pain Assessment Pain Scale: 0-10 Pain Score: 0-No pain    Therapy/Group: Individual Therapy  Lorie Phenix 07/01/2019, 6:17 PM

## 2019-07-01 NOTE — Progress Notes (Signed)
[place] PHYSICAL MEDICINE & REHABILITATION PROGRESS NOTE  Subjective/Complaints:  no new issues. Up in bed waiting for breakfast.   ROS: Patient denies fever, rash, sore throat, blurred vision, nausea, vomiting, diarrhea, cough, shortness of breath or chest pain, joint or back pain, headache, or mood change.   Objective: Vital Signs: Blood pressure 126/60, pulse 60, temperature 98.3 F (36.8 C), resp. rate 18, height 5' (1.524 m), weight 64.1 kg, SpO2 100 %. No results found. No results for input(s): WBC, HGB, HCT, PLT in the last 72 hours. Recent Labs    06/29/19 0639  NA 139  K 3.5  CL 107  CO2 22  GLUCOSE 207*  BUN 62*  CREATININE 1.63*  CALCIUM 9.0    Physical Exam: BP 126/60 (BP Location: Left Arm)   Pulse 60   Temp 98.3 F (36.8 C)   Resp 18   Ht 5' (1.524 m)   Wt 64.1 kg   SpO2 100%   BMI 27.60 kg/m  Constitutional: No distress . Vital signs reviewed. HEENT: EOMI, oral membranes moist Neck: supple Cardiovascular: RRR without murmur. No JVD    Respiratory/Chest: CTA Bilaterally without wheezes or rales. Normal effort    GI/Abdomen: BS +, non-tender, non-distended Ext: no clubbing, cyanosis, or edema Psych: pleasant and cooperative Musc: No edema in extremities.  No tenderness in extremities. Neurologic: Alert Motor: RUE/RLE: 5/5 proximal distal LUE: 4+-5/5 proximal distal LLE: 4+-5/5 proximal distal Left homonymous hemianopsia, stable Left upper extremity dysmetria, improving  Assessment/Plan: 1. Functional deficits secondary to right occipital ICH which require 3+ hours per day of interdisciplinary therapy in a comprehensive inpatient rehab setting.  Physiatrist is providing close team supervision and 24 hour management of active medical problems listed below.  Physiatrist and rehab team continue to assess barriers to discharge/monitor patient progress toward functional and medical goals  Care Tool:  Bathing    Body parts bathed by patient:  Right arm, Left arm, Chest, Abdomen, Front perineal area, Buttocks, Right upper leg, Left upper leg, Right lower leg, Left lower leg, Face   Body parts bathed by helper: Front perineal area, Buttocks     Bathing assist Assist Level: Contact Guard/Touching assist     Upper Body Dressing/Undressing Upper body dressing   What is the patient wearing?: Pull over shirt    Upper body assist Assist Level: Set up assist    Lower Body Dressing/Undressing Lower body dressing      What is the patient wearing?: Pants, Incontinence brief     Lower body assist Assist for lower body dressing: Supervision/Verbal cueing     Toileting Toileting    Toileting assist Assist for toileting: Moderate Assistance - Patient 50 - 74% (due to loose stool needing assist with pericare)     Transfers Chair/bed transfer  Transfers assist     Chair/bed transfer assist level: Supervision/Verbal cueing     Locomotion Ambulation   Ambulation assist   Ambulation activity did not occur: Safety/medical concerns (severe back pain)  Assist level: Supervision/Verbal cueing Assistive device: Walker-rolling Max distance: 231ft   Walk 10 feet activity   Assist  Walk 10 feet activity did not occur: Safety/medical concerns (severe back pain)  Assist level: Supervision/Verbal cueing Assistive device: Walker-rolling   Walk 50 feet activity   Assist Walk 50 feet with 2 turns activity did not occur: Safety/medical concerns (severe back pain)  Assist level: Supervision/Verbal cueing Assistive device: Walker-rolling    Walk 150 feet activity   Assist Walk 150 feet activity did  not occur: Safety/medical concerns (severe back pain)  Assist level: Supervision/Verbal cueing Assistive device: Walker-rolling    Walk 10 feet on uneven surface  activity   Assist Walk 10 feet on uneven surfaces activity did not occur: Safety/medical concerns (severe back pain)   Assist level: Supervision/Verbal  cueing Assistive device: Aeronautical engineer Will patient use wheelchair at discharge?:  (TBD) Type of Wheelchair: Manual    Wheelchair assist level: Contact Guard/Touching assist Max wheelchair distance: 129ft    Wheelchair 50 feet with 2 turns activity    Assist        Assist Level: Independent   Wheelchair 150 feet activity     Assist     Assist Level: Minimal Assistance - Patient > 75%      Medical Problem List and Plan: 1.Left sided weakness and visual-spatial, cognitive deficitssecondary to right occipital ICH (hypertensive vs amyloid angiopathy). Pt with some cognitive decline prior to hospitalization as well. Left homonymous hemianopsia   Continue CIR 2. Antithrombotics: -DVT/anticoagulation:Pharmaceutical:Lovenoxadded -antiplatelet therapy: N/A due to bleed. 3.Chronic LBP/Headaches/Pain Management: Tylenol prn  Vascular HA post ICH, added topiramate with improvement  Controlled on 6/11 4. Mood:LCSW to follow for evaluation and support. -antipsychotic agents: N/a 5. Neuropsych: This patientis not fullycapable of making decisions on herown behalf. 6. Skin/Wound Care:Routine pressure relief measures. 7. Fluids/Electrolytes/Nutrition:Monitor I/O.  8. HTN: Monitor BP -continue metoprolol.   Lisinopril increased to 20 mg on 06/01, increased to 30 on 6/6   Fair control 6/12  Monitor with increased mobility Vitals:   06/30/19 2000 07/01/19 0420  BP: (!) 151/50 126/60  Pulse: 72 60  Resp: 18 18  Temp: 99.6 F (37.6 C) 98.3 F (36.8 C)  SpO2: 99% 100%   9. T2DM with retinopathy: Hgb A1c-8.6. Was on Amaryl, NPH36u am/6u pmand regular insulins--23/23/27at home. Will continue to monitor BS ac/hs.   Levemir bid increased to 13 twice daily on 6/5 CBG (last 3)  Recent Labs    06/30/19 1638 06/30/19 2110 07/01/19 0617  GLUCAP 160* 185* 169*   Amaryl started on 6/8, increased on 6/11,  sugars dropping 10. FUO: Resolved Encourage IS.   WBC is within normal notes Dopplers limited, but negative for DVT  CXR personally reviewed, unremarkable  UA unremarkable, Ucx multiple species 10. ABLA: Likely due to bleed.   Hb 10.1 on 6/7  Cont to monitor 11. Macrocytosis:  Vitamin B12 levels low on 6/4  Supplement initiated 12.  AKI on CKD: Baseline SCr- 1.5-1.6.   Cr.  1.63 on 6/10, labs ordered for Monday  Echo reviewed, EF of 60-65%  IVF nightly x3 nights started on 6/10  Encourage fluids  Cont to monitor 13. Interstitial lung disease:IS/OOB 14. Anxiety disorder:On Zoloft. Will continue Seroquel and Klonopin at bedtime. 15.Dyslipidemia: Statin started due to SATURN trial, discussed with Neurology.  16. Hypoalbuminemia  Supplement initiated on 6/4  LOS: 9 days A FACE TO FACE EVALUATION WAS PERFORMED  Meredith Staggers 07/01/2019, 10:32 AM

## 2019-07-02 LAB — GLUCOSE, CAPILLARY
Glucose-Capillary: 104 mg/dL — ABNORMAL HIGH (ref 70–99)
Glucose-Capillary: 156 mg/dL — ABNORMAL HIGH (ref 70–99)
Glucose-Capillary: 82 mg/dL (ref 70–99)
Glucose-Capillary: 149 mg/dL — ABNORMAL HIGH (ref 70–99)

## 2019-07-02 NOTE — Progress Notes (Signed)
[place] PHYSICAL MEDICINE & REHABILITATION PROGRESS NOTE  Subjective/Complaints: Had a good night. No new complaints  ROS: Patient denies fever, rash, sore throat, blurred vision, nausea, vomiting, diarrhea, cough, shortness of breath or chest pain, joint or back pain, headache, or mood change.   Objective: Vital Signs: Blood pressure (!) 145/52, pulse 72, temperature 98.4 F (36.9 C), resp. rate 16, height 5' (1.524 m), weight 64.1 kg, SpO2 97 %. No results found. No results for input(s): WBC, HGB, HCT, PLT in the last 72 hours. No results for input(s): NA, K, CL, CO2, GLUCOSE, BUN, CREATININE, CALCIUM in the last 72 hours.  Physical Exam: BP (!) 145/52   Pulse 72   Temp 98.4 F (36.9 C)   Resp 16   Ht 5' (1.524 m)   Wt 64.1 kg   SpO2 97%   BMI 27.60 kg/m  Constitutional: No distress . Vital signs reviewed. HEENT: EOMI, oral membranes moist Neck: supple Cardiovascular: RRR without murmur. No JVD    Respiratory/Chest: CTA Bilaterally without wheezes or rales. Normal effort    GI/Abdomen: BS +, non-tender, non-distended Ext: no clubbing, cyanosis, or edema Psych: pleasant and cooperative Musc: No tenderness in extremities. Neurologic: Alert Motor: RUE/RLE: 5/5 proximal distal LUE: 4+-5/5 proximal distal LLE: 4+-5/5 proximal distal Left homonymous hemianopsia without change Left upper extremity dysmetria, improving  Assessment/Plan: 1. Functional deficits secondary to right occipital ICH which require 3+ hours per day of interdisciplinary therapy in a comprehensive inpatient rehab setting.  Physiatrist is providing close team supervision and 24 hour management of active medical problems listed below.  Physiatrist and rehab team continue to assess barriers to discharge/monitor patient progress toward functional and medical goals  Care Tool:  Bathing    Body parts bathed by patient: Right arm, Left arm, Chest, Abdomen, Front perineal area, Buttocks, Right upper  leg, Left upper leg, Right lower leg, Left lower leg, Face   Body parts bathed by helper: Front perineal area, Buttocks     Bathing assist Assist Level: Contact Guard/Touching assist     Upper Body Dressing/Undressing Upper body dressing   What is the patient wearing?: Pull over shirt    Upper body assist Assist Level: Set up assist    Lower Body Dressing/Undressing Lower body dressing      What is the patient wearing?: Pants, Incontinence brief     Lower body assist Assist for lower body dressing: Supervision/Verbal cueing     Toileting Toileting    Toileting assist Assist for toileting: Moderate Assistance - Patient 50 - 74% (due to loose stool needing assist with pericare)     Transfers Chair/bed transfer  Transfers assist     Chair/bed transfer assist level: Supervision/Verbal cueing     Locomotion Ambulation   Ambulation assist   Ambulation activity did not occur: Safety/medical concerns (severe back pain)  Assist level: Supervision/Verbal cueing Assistive device: Walker-rolling Max distance: 26ft   Walk 10 feet activity   Assist  Walk 10 feet activity did not occur: Safety/medical concerns (severe back pain)  Assist level: Supervision/Verbal cueing Assistive device: Walker-rolling   Walk 50 feet activity   Assist Walk 50 feet with 2 turns activity did not occur: Safety/medical concerns (severe back pain)  Assist level: Supervision/Verbal cueing Assistive device: Walker-rolling    Walk 150 feet activity   Assist Walk 150 feet activity did not occur: Safety/medical concerns (severe back pain)  Assist level: Supervision/Verbal cueing Assistive device: Walker-rolling    Walk 10 feet on uneven surface  activity  Assist Walk 10 feet on uneven surfaces activity did not occur: Safety/medical concerns (severe back pain)   Assist level: Supervision/Verbal cueing Assistive device: Walker-rolling   Wheelchair     Assist Will  patient use wheelchair at discharge?:  (TBD) Type of Wheelchair: Manual    Wheelchair assist level: Contact Guard/Touching assist Max wheelchair distance: 194ft    Wheelchair 50 feet with 2 turns activity    Assist        Assist Level: Independent   Wheelchair 150 feet activity     Assist     Assist Level: Minimal Assistance - Patient > 75%      Medical Problem List and Plan: 1.Left sided weakness and visual-spatial, cognitive deficitssecondary to right occipital ICH (hypertensive vs amyloid angiopathy). Pt with some cognitive decline prior to hospitalization as well. Left homonymous hemianopsia   Continue CIR 2. Antithrombotics: -DVT/anticoagulation:Pharmaceutical:Lovenoxadded -antiplatelet therapy: N/A due to bleed. 3.Chronic LBP/Headaches/Pain Management: Tylenol prn  Vascular HA post ICH, added topiramate with improvement  Controlled on 6/13 4. Mood:LCSW to follow for evaluation and support. -antipsychotic agents: N/a 5. Neuropsych: This patientis not fullycapable of making decisions on herown behalf. 6. Skin/Wound Care:Routine pressure relief measures. 7. Fluids/Electrolytes/Nutrition:Monitor I/O.  8. HTN: Monitor BP -continue metoprolol.   Lisinopril increased to 20 mg on 06/01, increased to 30 on 6/6   Fair to borderline control 6/13, avoid overtreating  Monitor with increased mobility Vitals:   07/02/19 0536 07/02/19 0756  BP: (!) 164/52 (!) 145/52  Pulse: 69 72  Resp: 16   Temp: 98.4 F (36.9 C)   SpO2: 97%    9. T2DM with retinopathy: Hgb A1c-8.6. Was on Amaryl, NPH36u am/6u pmand regular insulins--23/23/27at home. Will continue to monitor BS ac/hs.   Levemir bid increased to 13 twice daily on 6/5 CBG (last 3)  Recent Labs    07/01/19 1652 07/01/19 2116 07/02/19 0633  GLUCAP 130* 120* 104*   Amaryl started on 6/8, increased on 6/11, sugars much improved 6/13 10. FUO:  Resolved Encourage IS.   WBC is within normal notes Dopplers limited, but negative for DVT  CXR personally reviewed, unremarkable  UA unremarkable, Ucx multiple species 10. ABLA: Likely due to bleed.   Hb 10.1 on 6/7  Cont to monitor 11. Macrocytosis:  Vitamin B12 levels low on 6/4  Supplement initiated 12.  AKI on CKD: Baseline SCr- 1.5-1.6.   Cr.  1.63 on 6/10, labs ordered for Monday 6/14  Echo reviewed, EF of 60-65%  IVF nightly x3 nights started on 6/10  Encourage fluids    13. Interstitial lung disease:IS/OOB 14. Anxiety disorder:On Zoloft. Will continue Seroquel and Klonopin at bedtime. 15.Dyslipidemia: Statin started due to SATURN trial, discussed with Neurology.  16. Hypoalbuminemia  Supplement initiated on 6/4  LOS: 10 days A FACE TO FACE EVALUATION WAS PERFORMED  Meredith Staggers 07/02/2019, 9:27 AM

## 2019-07-03 ENCOUNTER — Inpatient Hospital Stay (HOSPITAL_COMMUNITY): Payer: Medicare HMO | Admitting: Physical Therapy

## 2019-07-03 ENCOUNTER — Inpatient Hospital Stay (HOSPITAL_COMMUNITY): Payer: Medicare HMO | Admitting: Occupational Therapy

## 2019-07-03 ENCOUNTER — Inpatient Hospital Stay (HOSPITAL_COMMUNITY): Payer: Medicare HMO

## 2019-07-03 DIAGNOSIS — E876 Hypokalemia: Secondary | ICD-10-CM

## 2019-07-03 LAB — BASIC METABOLIC PANEL
Anion gap: 8 (ref 5–15)
BUN: 30 mg/dL — ABNORMAL HIGH (ref 8–23)
CO2: 20 mmol/L — ABNORMAL LOW (ref 22–32)
Calcium: 8.6 mg/dL — ABNORMAL LOW (ref 8.9–10.3)
Chloride: 116 mmol/L — ABNORMAL HIGH (ref 98–111)
Creatinine, Ser: 1.29 mg/dL — ABNORMAL HIGH (ref 0.44–1.00)
GFR calc Af Amer: 44 mL/min — ABNORMAL LOW (ref >60)
GFR calc non Af Amer: 38 mL/min — ABNORMAL LOW (ref >60)
Glucose, Bld: 119 mg/dL — ABNORMAL HIGH (ref 70–99)
Potassium: 3.3 mmol/L — ABNORMAL LOW (ref 3.5–5.1)
Sodium: 144 mmol/L (ref 135–145)

## 2019-07-03 LAB — CBC
HCT: 30.7 % — ABNORMAL LOW (ref 36.0–46.0)
Hemoglobin: 9.9 g/dL — ABNORMAL LOW (ref 12.0–15.0)
MCH: 32.5 pg (ref 26.0–34.0)
MCHC: 32.2 g/dL (ref 30.0–36.0)
MCV: 100.7 fL — ABNORMAL HIGH (ref 80.0–100.0)
Platelets: 351 10*3/uL (ref 150–400)
RBC: 3.05 MIL/uL — ABNORMAL LOW (ref 3.87–5.11)
RDW: 11.5 % (ref 11.5–15.5)
WBC: 5 10*3/uL (ref 4.0–10.5)
nRBC: 0 % (ref 0.0–0.2)

## 2019-07-03 LAB — GLUCOSE, CAPILLARY
Glucose-Capillary: 102 mg/dL — ABNORMAL HIGH (ref 70–99)
Glucose-Capillary: 199 mg/dL — ABNORMAL HIGH (ref 70–99)
Glucose-Capillary: 147 mg/dL — ABNORMAL HIGH (ref 70–99)
Glucose-Capillary: 103 mg/dL — ABNORMAL HIGH (ref 70–99)

## 2019-07-03 MED ORDER — POTASSIUM CHLORIDE CRYS ER 20 MEQ PO TBCR
30.0000 meq | EXTENDED_RELEASE_TABLET | Freq: Two times a day (BID) | ORAL | Status: AC
  Administered 2019-07-03 (×2): 30 meq via ORAL
  Filled 2019-07-03 (×2): qty 1

## 2019-07-03 NOTE — Progress Notes (Signed)
Physical Therapy Session Note  Patient Details  Name: Sandra [name] MRN: [id number] Date of Birth: [birth year]  Today's Date: 07/03/2019 PT Individual Time: 1105-1200 PT Individual Time Calculation (min): 55 min   Short Term Goals: Week 2:  PT Short Term Goal 1 (Week 2): STG = LTG due to remaining ELOS  Skilled Therapeutic Interventions/Progress Updates:    Patient received up in [facility], very pleasant and only mildly confused today. Able to complete all functional bed mobility and transfers with S and RW, tolerated gait training [id number] with RW and cues for navigation in the hallway, also focused on functional balance skills today including cross midline reaching on foam pad, as well as playing connect-4 on blue foam pad with no UE support and only MinA to maintain balance even with dynamic tasks and reaching today. [facility] [place] [facility] S and cues for hand placement. Left in bed with all needs met, bed alarm active this morning.   Therapy Documentation Precautions:  Precautions Precautions: Fall Precaution Comments: L hemianopsia, L inattention, apraxia, severe back pain (would use back precautions for comfort) Restrictions Weight Bearing Restrictions: No   Pain: Pain Assessment Pain Scale: 0-10 Pain Score: 0-No pain Pain Type: Chronic pain Pain Location: Head (pain behind R eye) Pain Orientation: Right Pain Descriptors / Indicators: Headache Pain Onset: On-going Pain Intervention(s): RN made aware    Therapy/Group: Individual Therapy   KWindell Norfolk DPT, PN1   Supplemental Physical Therapist [facility]   Pager [phone] Rehab Office [phone]   07/03/2019, 12:32 PM

## 2019-07-03 NOTE — Progress Notes (Signed)
[facility] PHYSICAL MEDICINE & REHABILITATION PROGRESS NOTE  Subjective/Complaints: Patient seen sitting up in bed this morning.  She states she slept well overnight.  She states had a good weekend.  She initially does not appear aware of discharge planned for Wednesday.  ROS: Denies CP, SOB, N/V/D  Objective: Vital Signs: Blood pressure (!) 148/52, pulse 68, temperature 98 F (36.7 C), temperature source Oral, resp. rate 16, height 5' (1.524 m), weight 64.1 kg, SpO2 99 %. No results found. Recent Labs    07/03/19 0603  WBC 5.0  HGB 9.9*  HCT 30.7*  PLT 351   Recent Labs    07/03/19 0603  NA 144  K 3.3*  CL 116*  CO2 20*  GLUCOSE 119*  BUN 30*  CREATININE 1.29*  CALCIUM 8.6*    Physical Exam: BP (!) 148/52   Pulse 68   Temp 98 F (36.7 C) (Oral)   Resp 16   Ht 5' (1.524 m)   Wt 64.1 kg   SpO2 99%   BMI 27.60 kg/m  Constitutional: No distress . Vital signs reviewed. HENT: Normocephalic.  Atraumatic. Eyes: EOMI. No discharge. Cardiovascular: No JVD. Respiratory: Normal effort.  No stridor. GI: Non-distended. Skin: Warm and dry.  Intact. Psych: Normal mood.  Normal behavior. Musc: No edema in extremities.  No tenderness in extremities. Neurologic: Alert Motor: RUE/RLE: 5/5 proximal distal LUE: 4+-5/5 proximal distal, unchanged LLE: 4+-5/5 proximal distal Left homonymous hemianopsia without change Left upper extremity dysmetria, improving  Assessment/Plan: 1. Functional deficits secondary to right occipital ICH which require 3+ hours per day of interdisciplinary therapy in a comprehensive inpatient rehab setting.  Physiatrist is providing close team supervision and 24 hour management of active medical problems listed below.  Physiatrist and rehab team continue to assess barriers to discharge/monitor patient progress toward functional and medical goals  Care Tool:  Bathing    Body parts bathed by patient: Right arm, Left arm, Chest, Abdomen, Front  perineal area, Buttocks, Right upper leg, Left upper leg, Right lower leg, Left lower leg, Face   Body parts bathed by helper: Front perineal area, Buttocks     Bathing assist Assist Level: Contact Guard/Touching assist     Upper Body Dressing/Undressing Upper body dressing   What is the patient wearing?: Pull over shirt    Upper body assist Assist Level: Set up assist    Lower Body Dressing/Undressing Lower body dressing      What is the patient wearing?: Pants, Incontinence brief     Lower body assist Assist for lower body dressing: Supervision/Verbal cueing     Toileting Toileting    Toileting assist Assist for toileting: Moderate Assistance - Patient 50 - 74% (due to loose stool needing assist with pericare)     Transfers Chair/bed transfer  Transfers assist     Chair/bed transfer assist level: Supervision/Verbal cueing     Locomotion Ambulation   Ambulation assist   Ambulation activity did not occur: Safety/medical concerns (severe back pain)  Assist level: Supervision/Verbal cueing Assistive device: Walker-rolling Max distance: 271ft   Walk 10 feet activity   Assist  Walk 10 feet activity did not occur: Safety/medical concerns (severe back pain)  Assist level: Supervision/Verbal cueing Assistive device: Walker-rolling   Walk 50 feet activity   Assist Walk 50 feet with 2 turns activity did not occur: Safety/medical concerns (severe back pain)  Assist level: Supervision/Verbal cueing Assistive device: Walker-rolling    Walk 150 feet activity   Assist Walk 150 feet activity did  not occur: Safety/medical concerns (severe back pain)  Assist level: Supervision/Verbal cueing Assistive device: Walker-rolling    Walk 10 feet on uneven surface  activity   Assist Walk 10 feet on uneven surfaces activity did not occur: Safety/medical concerns (severe back pain)   Assist level: Supervision/Verbal cueing Assistive device: Horticulturist, commercial Will patient use wheelchair at discharge?:  (TBD) Type of Wheelchair: Manual    Wheelchair assist level: Contact Guard/Touching assist Max wheelchair distance: 164ft    Wheelchair 50 feet with 2 turns activity    Assist        Assist Level: Independent   Wheelchair 150 feet activity     Assist     Assist Level: Minimal Assistance - Patient > 75%      Medical Problem List and Plan: 1.Left sided weakness and visual-spatial, cognitive deficitssecondary to right occipital ICH (hypertensive vs amyloid angiopathy). Pt with some cognitive decline prior to hospitalization as well. Left homonymous hemianopsia   Continue CIR 2. Antithrombotics: -DVT/anticoagulation:Pharmaceutical:Lovenoxadded -antiplatelet therapy: N/A due to bleed. 3.Chronic LBP/Headaches/Pain Management: Tylenol prn  Vascular HA post ICH, added topiramate with improvement  Controlled on 6/14 4. Mood:LCSW to follow for evaluation and support. -antipsychotic agents: N/a 5. Neuropsych: This patientis not fullycapable of making decisions on herown behalf. 6. Skin/Wound Care:Routine pressure relief measures. 7. Fluids/Electrolytes/Nutrition:Monitor I/O.  8. HTN: Monitor BP -continue metoprolol.   Lisinopril increased to 20 mg on 06/01, increased to 30 on 6/6   Mildly elevated on 6/14  Monitor with increased mobility Vitals:   07/03/19 0415 07/03/19 0810  BP: (!) 147/65 (!) 148/52  Pulse: 71 68  Resp: 16   Temp: 98 F (36.7 C)   SpO2: 99%    9. T2DM with retinopathy: Hgb A1c-8.6. Was on Amaryl, NPH36u am/6u pmand regular insulins--23/23/27at home. Will continue to monitor BS ac/hs.   Levemir bid increased to 13 twice daily on 6/5 CBG (last 3)  Recent Labs    07/02/19 1717 07/02/19 2110 07/03/19 0621  GLUCAP 82 149* 102*   Amaryl started on 6/8, increased on 6/11  Elevated, but improving on 6/14 10. FUO:  Resolved Encourage IS.   WBC is within normal notes Dopplers limited, but negative for DVT  CXR personally reviewed, unremarkable  UA unremarkable, Ucx multiple species 10. ABLA: Likely due to bleed.   Hb 9.9 on 6/14  Cont to monitor 11. Macrocytosis:  Vitamin B12 levels low on 6/4  Supplement initiated 12.  AKI on CKD: Baseline SCr- 1.5-1.6.   Cr.  1.29 on 6/14  Echo reviewed, EF of 60-65%  IVF nightly x3 nights started on 6/10  Encourage fluids 13. Interstitial lung disease:IS/OOB 14. Anxiety disorder:On Zoloft. Will continue Seroquel and Klonopin at bedtime. 15.Dyslipidemia: Statin started due to SATURN trial, discussed with Neurology.  16. Hypoalbuminemia  Supplement initiated on 6/4 17.  Hypokalemia  Potassium 3.3 on 6/14  Supplemented x1 day  Continue to monitor  LOS: 11 days A FACE TO FACE EVALUATION WAS PERFORMED  Sandra [name] Lorie Phenix 07/03/2019, 8:40 AM

## 2019-07-03 NOTE — Progress Notes (Signed)
Speech Language Pathology Daily Session Note  Patient Details  Name: Sandra [name] MRN: [id number] Date of Birth: Jul 07, 1933  Today's Date: 07/03/2019 SLP Individual Time: 3254-9826 SLP Individual Time Calculation (min): 57 min  Short Term Goals: Week 2: SLP Short Term Goal 1 (Week 2): STG=LTG due to remaining LOS  Skilled Therapeutic Interventions: Pt was seen for skilled ST targeting cognition. Pt required overall Mod A multimodal cues for organization and complex problem solving when starting a complex checkbook management task. Pt with keen awareness of level of difficulty this complex task presented her and became very labile. Emotional support and education provided regarding acute cognitive changes in these areas post-CVA as well as discussion regarding family edu tomorrow and recommendations for assistance with semi-complex to complex tasks such as medication and finance management. Pt in agreement. Pt then engaged in verbal problem solving of semi-complex safety scenarios in which she identified problems and solutions to prevent falls and general home safety practices with only 1 Min A verbal cue. Functional conversation targeted recall of new information, with Min A verbal cues being provided for recall regarding medication name/function and correct details of a study pt is participating in. Pt left laying in bed with alarm set and needs within reach. Continue per current plan of care.        Pain Pain Assessment Pain Scale: 0-10 Pain Score: 0-No pain  Therapy/Group: Individual Therapy  Arbutus Leas 07/03/2019, 7:15 AM

## 2019-07-03 NOTE — Progress Notes (Signed)
Occupational Therapy Session Note  Patient Details  Name: Sandra [name] MRN: [id number] Date of Birth: 12-24-1933  Today's Date: 07/03/2019 OT Individual Time: 4514-6047 OT Individual Time Calculation (min): 75 min    Short Term Goals: Week 2:  OT Short Term Goal 1 (Week 2): STGs= LTGs  Skilled Therapeutic Interventions/Progress Updates:    Pt received in bed ready for therapy and agreeable to a shower. She is looking forward to going home on Wednesday. Pt able to complete bed mobility, sit to stand, ambulation to bathroom with RW, toilet and shower transfers all with supervision.  (walk in shower)   She toileted first with S for urine only.  In shower, completed all bathing and washing hair with S using sit to stands as needed.  At end of shower, pt had sudden urgency to have a bowel movement and sitting on bench she had diarrhea.  (documented in flow sheet).  Washed bottom again and rinsed shower. Pt was very embarrassed and kept apologizing. Reassured her it was just an accident.   Pt requested to sit on toilet again as she felt she needed to go again.  Pt was able to void more.  S with all toileting tasks. Ambulated back to bed to apply lotion and dress.  She needs A to don socks and introduced a sock aide, A with shoes.  Pt sat at sink to complete grooming. Pt did not demonstrate any confusion this session and followed directions well.   Resting in wc with belt alarm on and all needs met.   Therapy Documentation Precautions:  Precautions Precautions: Fall Precaution Comments: L hemianopsia, L inattention, apraxia, severe back pain (would use back precautions for comfort) Restrictions Weight Bearing Restrictions: No    Vital Signs:  Pain: Pain Assessment Pain Score: 6  Pain Type: Chronic pain Pain Location: Head (pain behind R eye) Pain Orientation: Right Pain Descriptors / Indicators: Headache Pain Onset: On-going Pain Intervention(s): RN made aware ADL: ADL Eating: Set  up Grooming: Supervision/safety Where Assessed-Grooming: Standing at sink Upper Body Bathing: Supervision/safety Where Assessed-Upper Body Bathing: Shower Lower Body Bathing: Supervision/safety Where Assessed-Lower Body Bathing: Shower Upper Body Dressing: Supervision/safety Where Assessed-Upper Body Dressing: Edge of bed Lower Body Dressing: Minimal assistance (A for socks and shoes) Where Assessed-Lower Body Dressing: Edge of bed Toileting: Supervision/safety Where Assessed-Toileting: Glass blower/designer: Close supervision Armed forces technical officer Method: Counselling psychologist: Energy manager: Close supervision Social research officer, government Method: Heritage manager: Radio broadcast assistant, Grab bars  Therapy/Group: Individual Therapy  [place] 07/03/2019, 12:17 PM

## 2019-07-03 NOTE — Progress Notes (Signed)
Pt rested throughout night with no complaints. Tolerated IVF well.

## 2019-07-04 ENCOUNTER — Inpatient Hospital Stay (HOSPITAL_COMMUNITY): Payer: Medicare HMO | Admitting: Occupational Therapy

## 2019-07-04 ENCOUNTER — Inpatient Hospital Stay (HOSPITAL_COMMUNITY): Payer: Medicare HMO

## 2019-07-04 ENCOUNTER — Inpatient Hospital Stay (HOSPITAL_COMMUNITY): Payer: Medicare HMO | Admitting: Physical Therapy

## 2019-07-04 LAB — GLUCOSE, CAPILLARY
Glucose-Capillary: 56 mg/dL — ABNORMAL LOW (ref 70–99)
Glucose-Capillary: 159 mg/dL — ABNORMAL HIGH (ref 70–99)
Glucose-Capillary: 176 mg/dL — ABNORMAL HIGH (ref 70–99)
Glucose-Capillary: 124 mg/dL — ABNORMAL HIGH (ref 70–99)
Glucose-Capillary: 127 mg/dL — ABNORMAL HIGH (ref 70–99)

## 2019-07-04 LAB — BASIC METABOLIC PANEL
Anion gap: 6 (ref 5–15)
BUN: 21 mg/dL (ref 8–23)
CO2: 18 mmol/L — ABNORMAL LOW (ref 22–32)
Calcium: 8.7 mg/dL — ABNORMAL LOW (ref 8.9–10.3)
Chloride: 116 mmol/L — ABNORMAL HIGH (ref 98–111)
Creatinine, Ser: 1.23 mg/dL — ABNORMAL HIGH (ref 0.44–1.00)
GFR calc Af Amer: 47 mL/min — ABNORMAL LOW (ref >60)
GFR calc non Af Amer: 41 mL/min — ABNORMAL LOW (ref >60)
Glucose, Bld: 197 mg/dL — ABNORMAL HIGH (ref 70–99)
Potassium: 4.1 mmol/L (ref 3.5–5.1)
Sodium: 140 mmol/L (ref 135–145)

## 2019-07-04 MED ORDER — INSULIN DETEMIR 100 UNIT/ML ~~LOC~~ SOLN
7.0000 [IU] | Freq: Two times a day (BID) | SUBCUTANEOUS | Status: DC
  Administered 2019-07-04 – 2019-07-05 (×3): 7 [IU] via SUBCUTANEOUS
  Filled 2019-07-04 (×4): qty 0.07

## 2019-07-04 MED ORDER — INSULIN DETEMIR 100 UNIT/ML ~~LOC~~ SOLN: 13.0000 [IU] | Freq: Every day | SUBCUTANEOUS | Status: DC

## 2019-07-04 MED ORDER — LISINOPRIL 20 MG PO TABS
40.0000 mg | ORAL_TABLET | Freq: Every day | ORAL | Status: DC
  Administered 2019-07-04 – 2019-07-05 (×2): 40 mg via ORAL
  Filled 2019-07-04: qty 2

## 2019-07-04 NOTE — Progress Notes (Signed)
Speech Language Pathology Discharge Summary  Patient Details  Name: Sandra [name] MRN: [id number] Date of Birth: 12-02-1933  Today's Date: 07/04/2019 SLP Individual Time: 1301-1400 SLP Individual Time Calculation (min): 59 min   Skilled Therapeutic Interventions:  Pt was seen for skilled ST targeting cognitive goals. Although family education was attempted today, not family present. ST reviewed compensatory memory strategies handout with pt, as well as recommendations for 24/7 supervision at home for safety as well as assistance specifically with medication and finance management. Handout as well as handwritten note with personalized recommendations for previously mentioned tasks placed in pt's healthcare notebook to be taken home with family at d/c. SLP also provided Min A verbal cues for pt to anticipate 2 complex cognitive ADLs she would need assistance with at d/c. SLP also administered MOCA version 7.1 as post-test to reassess cognitive-linguistic function since admission. She scored 20/30 (WNL = 26 or >). Min A verbal and visual cues provided for problem solving with use of calculator for basic calculations. Improvements noted in short term delayed recall task (3/5 words recalled). Most noteworthy deficits noted during assessment that would warrant further treatment in follow up therapy were executive functioning, problem solving, and recall strategies. Pt selectively attended to tasks with Supervision A verbal cues today. Pt left sitting in chair with alarm set and needs within reach. Continue per current plan of care.    Patient has met 4 of 4 long term goals.  Patient to discharge at [facility] level.  Reasons goals not met: n/a   Clinical Impression/Discharge Summary:   Pt made functional gains and met 4 out of 4 long term goals this admission. Pt currently requires Min assist for basic to mildly complex tasks due to cognitive impairments impacting her problem solving abilities, emergent  awareness, sustained and selective attention, and short term memory. She has also exhibited fluctuating levels of confusion throughout her stay, which makes the predictability of her performance challenging and inconsistent. A sa result, she will require 24/7 supervision at discharge for greatest safety. Pt has demonstrated improved basic problem solving and awareness of errors. However, given cognitive deficits still present and impacting daily functioning and independence, recommend pt continue to receive skilled ST services upon discharge. Pt education is complete at this time, however no family was present for ST sessions - education was attempted 07/04/19 but no family was present for scheduled session.   Care Partner:  Caregiver Able to Provide Assistance: Yes  Type of Caregiver Assistance: Cognitive  Recommendation:  Home Health SLP;24 hour supervision/assistance  Rationale for SLP Follow Up: Maximize cognitive function and independence;Reduce caregiver burden   Equipment: none   Reasons for discharge: Discharged from hospital   Patient/Family Agrees with Progress Made and Goals Achieved: Yes    Arbutus Leas 07/04/2019, 7:15 AM

## 2019-07-04 NOTE — Progress Notes (Addendum)
Physical Therapy Discharge Summary  Patient Details  Name: Sandra [name] MRN: [id number] Date of Birth: [birth year]  Today's Date: 07/04/2019      Patient has met 11 of 11 long term goals due to improved activity tolerance, improved balance, improved postural control, increased strength, decreased pain, ability to compensate for deficits and improved coordination.  Patient to discharge at an ambulatory level generally supervision but occasional MinA for curb navigation with RW. Marland Kitchen   Patient's care partner is independent to provide the necessary physical assistance at discharge.  Reasons goals not met: N/A- all goals met and actually exceeded!   Recommendation:  Patient will benefit from ongoing skilled PT services in outpatient setting to continue to advance safe functional mobility, address ongoing impairments in balance, strength, cognition, safety awareness, gait and stair training, and minimize fall risk.  Equipment: youth RW, OP PT, BSC   Reasons for discharge: treatment goals met and discharge from hospital  Patient/family agrees with progress made and goals achieved: Yes  PT Discharge Precautions/Restrictions Precautions Precautions: Fall Precaution Comments: L hemianopsia, L inattention, apraxia, severe back pain (would use back precautions for comfort) Restrictions Weight Bearing Restrictions: No Vital Signs Therapy Vitals Temp: 98.4 F (36.9 C) Pulse Rate: 67 Resp: 16 BP: (!) 176/50 Patient Position (if appropriate): Sitting Oxygen Therapy SpO2: 100 % O2 Device: Room Air Pain Pain Assessment Pain Scale: 0-10 Pain Score: 0-No pain Vision/Perception  Vision - Assessment Eye Alignment: Within Functional Limits Ocular Range of Motion: Within Functional Limits Additional Comments: requires cues to attend to left side and fully turn head to left side Perception Perception: Impaired Praxis Praxis: Intact  Cognition Overall Cognitive Status: Impaired/Different  from baseline Arousal/Alertness: Awake/alert Orientation Level: Oriented X4 Attention: Sustained Focused Attention: Appears intact Sustained Attention: Impaired Memory: Impaired Awareness: Impaired Problem Solving: Impaired Organizing: Impaired Self Monitoring: Impaired Safety/Judgment: Impaired Comments: cognition waxes and wanes- some days/times she is much more clear than others Sensation Sensation Light Touch: Appears Intact Hot/Cold: Appears Intact Proprioception: Impaired by gross assessment Stereognosis: Impaired by gross assessment Coordination Gross Motor Movements are Fluid and Coordinated: No Fine Motor Movements are Fluid and Coordinated: No Finger Nose Finger Test: mild ataxia when RAM challenged Heel Shin Test: mild ataxia L LE>R LE Motor  Motor Motor: Other (comment) (mild hemiparesis) Motor - Discharge Observations: very mild hemiparesis L sided  Mobility Bed Mobility Bed Mobility: Rolling Right;Rolling Left;Supine to Sit;Sit to Supine Rolling Right: Supervision/verbal cueing Rolling Left: Supervision/Verbal cueing Supine to Sit: Supervision/Verbal cueing Sit to Supine: Supervision/Verbal cueing Transfers Transfers: Sit to Stand;Stand to Sit;Stand Pivot Transfers Sit to Stand: Supervision/Verbal cueing Stand to Sit: Supervision/Verbal cueing Stand Pivot Transfers: Supervision/Verbal cueing Transfer (Assistive device): Rolling walker Locomotion  Gait Ambulation: Yes Gait Assistance: Supervision/Verbal cueing Gait Distance (Feet): 325 Feet Assistive device: Rolling walker Gait Assistance Details: Verbal cues for safe use of DME/AE Gait Gait: Yes Gait Pattern: Impaired Gait Pattern: Step-through pattern;Trunk flexed;Wide base of support Stairs / Additional Locomotion Stairs: Yes Stairs Assistance: Supervision/Verbal cueing Stair Management Technique: Two rails Number of Stairs: 12 Height of Stairs:  (4 and 6 inch) Ramp: Supervision/Verbal  cueing Curb: Minimal Assistance - Patient >75% Wheelchair Mobility Wheelchair Mobility: No Distance: functional ambulator  Trunk/Postural Assessment  Cervical Assessment Cervical Assessment: Exceptions to [facility] (forward head) Thoracic Assessment Thoracic Assessment: Exceptions to [facility] (kyphotic) Lumbar Assessment Lumbar Assessment: Exceptions to [facility] (posterior pelvic tilt) Postural Control Postural Control: Deficits on evaluation  Balance Balance Balance Assessed: Yes Static Sitting Balance Static Sitting - Level of  Assistance: 7: Independent Dynamic Sitting Balance Dynamic Sitting - Level of Assistance: 5: Stand by assistance Static Standing Balance Static Standing - Level of Assistance: 6: Modified independent (Device/Increase time) Dynamic Standing Balance Dynamic Standing - Level of Assistance: 4: Min assist Extremity Assessment  RUE Assessment RUE Assessment: Not tested LUE Assessment LUE Assessment: Not tested RLE Assessment RLE Assessment: Exceptions to [facility] Active Range of Motion (AROM) Comments: functionally WNL General Strength Comments: anlke DF 5/5, quad 4+/5, hip flexor 4/5 LLE Assessment LLE Assessment: Exceptions to [facility] Active Range of Motion (AROM) Comments: functionally WNL General Strength Comments: ankle dorsiflexor 4+/5, quad 4/5, hip flexor 3+/5     Windell Norfolk, DPT, PN1   Supplemental Physical Therapist [place]    Pager [phone] Acute Rehab Office [phone]

## 2019-07-04 NOTE — Progress Notes (Signed)
Patient ID: Sandra [name], female   DOB: Jul 17, 1933, 86 y.o.   MRN: [id number]   Patient set up with [facility], orders sent

## 2019-07-04 NOTE — Progress Notes (Signed)
Occupational Therapy Discharge Summary  Patient Details  Name: Sandra [name] MRN: [id number] Date of Birth: Aug 02, 1933  Today's Date: 07/04/2019 OT Individual Time: 1451-1531 OT Individual Time Calculation (min): 40 min   Session Note:  Pt worked on donning socks and shoes sitting EOB to start session.  She was able to bring them up on the EOB with increased time to donn both of them with setup assist. She next complete functional mobility to the bathroom and performed toilet transfer and toileting with use of the RW for support and close supervision.  After washing her hands at the sink, she performed functional mobility down to the dayroom with use of the RW and supervision.  While sitting on a therapy mat to rest, her vision was examined with tracking and convergence WFLS.  Gross testing of visual fields were normal as well, but note recent history of left visual field deficit related to this CVA, so may need closer examination with perimetry testing at the optometrist.  Finished session with return to the room using the RW.  Pt with decreased awareness of her room on the right side and she walked past it.  Min instructional cueing needed to identify the room number and locate her room.  Pt left in bed with call button and phone in reach and safety alarm in place.    Patient has met 11 of 11 long term goals due to improved activity tolerance, improved balance, ability to compensate for deficits, functional use of  LEFT upper and LEFT lower extremity, improved awareness and improved coordination.  Patient to discharge at overall Supervision level.  Patient's care partner is independent to provide the necessary physical and cognitive assistance at discharge.    Reasons goals not met: NA  Recommendation:  Patient will benefit from ongoing skilled OT services in outpatient to continue to advance functional skills in the area of BADL, iADL and Reduce care partner burden.  Pt continues to demonstrate  decreased dynamic standing balance for safe transfers and selfcare tasks sit to stand.  Feel she will benefit from continued outpatient OT to address these deficits.    Equipment: BSC  Reasons for discharge: treatment goals met and discharge from hospital  Patient/family agrees with progress made and goals achieved: Yes  OT Discharge Precautions/Restrictions  Precautions Precautions: Fall Precaution Comments: L hemianopsia, L inattention, apraxia, severe back pain (would use back precautions for comfort) Restrictions Weight Bearing Restrictions: No  Pain Pain Assessment Pain Scale: 0-10 Pain Score: 0-No pain ADL ADL Eating: Set up Where Assessed-Eating: Wheelchair Grooming: Supervision/safety Where Assessed-Grooming: Standing at sink Upper Body Bathing: Supervision/safety Where Assessed-Upper Body Bathing: Shower Lower Body Bathing: Supervision/safety Where Assessed-Lower Body Bathing: Shower Upper Body Dressing: Supervision/safety Where Assessed-Upper Body Dressing: Wheelchair, Edge of bed Lower Body Dressing: Supervision/safety Where Assessed-Lower Body Dressing: Wheelchair, Edge of bed Toileting: Supervision/safety Where Assessed-Toileting: Glass blower/designer: Close supervision Armed forces technical officer Method: Counselling psychologist: Raised toilet seat Tub/Shower Transfer: Close supervison Clinical cytogeneticist Method: Optometrist: Civil engineer, contracting with back Social research officer, government: Close supervision Social research officer, government Method: Heritage manager: Civil engineer, contracting with back Vision Baseline Vision/History: Wears glasses Wears Glasses: At all times Patient Visual Report: No change from baseline Vision Assessment?: Yes Eye Alignment: Within Functional Limits Ocular Range of Motion: Within Functional Limits Tracking/Visual Pursuits: Able to track stimulus in all quads without difficulty Convergence: Within functional  limits Visual Fields: No apparent deficits (hemianopsia not present with gross testing.  Needs more comprehensive evaluation) Additional  Comments: requires cues to attend to left side and fully turn head to left side Perception  Perception: Impaired Comments: mild left inattention Praxis Praxis: Intact Cognition Overall Cognitive Status: Impaired/Different from baseline Arousal/Alertness: Awake/alert Orientation Level: Oriented X4 Attention: Sustained Focused Attention: Appears intact Sustained Attention: Appears intact Sustained Attention Impairment: Functional basic;Verbal basic Memory: Impaired Memory Impairment: Decreased short term memory Awareness: Impaired Awareness Impairment: Emergent impairment Problem Solving: Impaired Problem Solving Impairment: Functional complex;Verbal complex Organizing: Impaired Self Monitoring: Impaired Safety/Judgment: Impaired Comments: cognition waxes and wanes- some days/times she is much more clear than others Sensation Sensation Light Touch: Appears Intact Hot/Cold: Appears Intact Proprioception: Appears Intact Stereognosis: Appears Intact Coordination Gross Motor Movements are Fluid and Coordinated: No Fine Motor Movements are Fluid and Coordinated: No Coordination and Movement Description: Slight dysmetria in the LUE compared to the right, but uses functionally at a non-dominant level. Finger Nose Finger Test: mild ataxia when RAM challenged Heel Shin Test: mild ataxia L LE>R LE Motor  Motor Motor: Abnormal postural alignment and control Motor - Discharge Observations: very mild hemiparesis L sided Mobility  Bed Mobility Bed Mobility: Rolling Right;Rolling Left;Supine to Sit;Sit to Supine Rolling Right: Supervision/verbal cueing Rolling Left: Supervision/Verbal cueing Supine to Sit: Supervision/Verbal cueing Sit to Supine: Supervision/Verbal cueing Transfers Sit to Stand: Supervision/Verbal cueing Stand to Sit:  Supervision/Verbal cueing  Trunk/Postural Assessment  Cervical Assessment Cervical Assessment: Exceptions to [facility] (forward head) Thoracic Assessment Thoracic Assessment: Exceptions to [facility] (kyphotic) Lumbar Assessment Lumbar Assessment: Exceptions to [facility] (posterior pelvic tilt) Postural Control Postural Control: Deficits on evaluation  Balance Balance Balance Assessed: Yes Static Sitting Balance Static Sitting - Level of Assistance: 7: Independent Dynamic Sitting Balance Dynamic Sitting - Level of Assistance: 6: Modified independent (Device/Increase time) Static Standing Balance Static Standing - Level of Assistance: 6: Modified independent (Device/Increase time) Dynamic Standing Balance Dynamic Standing - Level of Assistance: 4: Min assist Extremity/Trunk Assessment RUE Assessment RUE Assessment: Within Functional Limits LUE Assessment LUE Assessment: Exceptions to [facility] Active Range of Motion (AROM) Comments: [facility] General Strength Comments: 4-/5 throughout, slight dysmetria with finger to nose testing   Vickey Ewbank OTR/L 07/04/2019, 5:17 PM

## 2019-07-04 NOTE — Progress Notes (Signed)
[place] PHYSICAL MEDICINE & REHABILITATION PROGRESS NOTE  Subjective/Complaints: Patient seen sitting up in bed this morning.  She states she slept well overnight.  She is aware of plans for discharge tomorrow.  She notes she had hypoglycemia overnight.  ROS: Denies CP, SOB, N/V/D  Objective: Vital Signs: Blood pressure (!) 154/54, pulse 62, temperature 98.1 F (36.7 C), resp. rate 16, height 5' (1.524 m), weight 64.1 kg, SpO2 100 %. No results found. Recent Labs    07/03/19 0603  WBC 5.0  HGB 9.9*  HCT 30.7*  PLT 351   Recent Labs    07/03/19 0603  NA 144  K 3.3*  CL 116*  CO2 20*  GLUCOSE 119*  BUN 30*  CREATININE 1.29*  CALCIUM 8.6*    Physical Exam: BP (!) 154/54 (BP Location: Left Arm)   Pulse 62   Temp 98.1 F (36.7 C)   Resp 16   Ht 5' (1.524 m)   Wt 64.1 kg   SpO2 100%   BMI 27.60 kg/m  Constitutional: No distress . Vital signs reviewed. HENT: Normocephalic.  Atraumatic. Eyes: EOMI. No discharge. Cardiovascular: No JVD. Respiratory: Normal effort.  No stridor. GI: Non-distended. Skin: Warm and dry.  Intact. Psych: Normal mood.  Normal behavior. Musc: Right elbow with mild improving edema Neurologic: Alert Motor:  LUE: 4+-5/5 proximal distal, unchanged LLE: 4+-5/5 proximal distal Left homonymous hemianopsia stable Left upper extremity dysmetria, improving  Assessment/Plan: 1. Functional deficits secondary to right occipital ICH which require 3+ hours per day of interdisciplinary therapy in a comprehensive inpatient rehab setting.  Physiatrist is providing close team supervision and 24 hour management of active medical problems listed below.  Physiatrist and rehab team continue to assess barriers to discharge/monitor patient progress toward functional and medical goals  Care Tool:  Bathing    Body parts bathed by patient: Right arm, Left arm, Chest, Abdomen, Front perineal area, Buttocks, Right upper leg, Left upper leg, Right lower  leg, Left lower leg, Face   Body parts bathed by helper: Front perineal area, Buttocks     Bathing assist Assist Level: Supervision/Verbal cueing     Upper Body Dressing/Undressing Upper body dressing   What is the patient wearing?: Pull over shirt    Upper body assist Assist Level: Set up assist    Lower Body Dressing/Undressing Lower body dressing      What is the patient wearing?: Pants, Incontinence brief     Lower body assist Assist for lower body dressing: Supervision/Verbal cueing     Toileting Toileting    Toileting assist Assist for toileting: Supervision/Verbal cueing     Transfers Chair/bed transfer  Transfers assist     Chair/bed transfer assist level: Supervision/Verbal cueing     Locomotion Ambulation   Ambulation assist   Ambulation activity did not occur: Safety/medical concerns (severe back pain)  Assist level: Supervision/Verbal cueing Assistive device: Walker-rolling Max distance: 352ft   Walk 10 feet activity   Assist  Walk 10 feet activity did not occur: Safety/medical concerns (severe back pain)  Assist level: Supervision/Verbal cueing Assistive device: Walker-rolling   Walk 50 feet activity   Assist Walk 50 feet with 2 turns activity did not occur: Safety/medical concerns (severe back pain)  Assist level: Supervision/Verbal cueing Assistive device: Walker-rolling    Walk 150 feet activity   Assist Walk 150 feet activity did not occur: Safety/medical concerns (severe back pain)  Assist level: Supervision/Verbal cueing Assistive device: Walker-rolling    Walk 10 feet on uneven surface  activity   Assist Walk 10 feet on uneven surfaces activity did not occur: Safety/medical concerns (severe back pain)   Assist level: Supervision/Verbal cueing Assistive device: Aeronautical engineer Will patient use wheelchair at discharge?:  (TBD) Type of Wheelchair: Manual    Wheelchair assist level:  Contact Guard/Touching assist Max wheelchair distance: 110ft    Wheelchair 50 feet with 2 turns activity    Assist        Assist Level: Independent   Wheelchair 150 feet activity     Assist     Assist Level: Minimal Assistance - Patient > 75%      Medical Problem List and Plan: 1.Left sided weakness and visual-spatial, cognitive deficitssecondary to right occipital ICH (hypertensive vs amyloid angiopathy). Pt with some cognitive decline prior to hospitalization as well. Left homonymous hemianopsia   Continue CIR 2. Antithrombotics: -DVT/anticoagulation:Pharmaceutical:Lovenoxadded -antiplatelet therapy: N/A due to bleed. 3.Chronic LBP/Headaches/Pain Management: Tylenol prn  Vascular HA post ICH, added topiramate with improvement  Controlled on 6/15 4. Mood:LCSW to follow for evaluation and support. -antipsychotic agents: N/a 5. Neuropsych: This patientis not fullycapable of making decisions on herown behalf. 6. Skin/Wound Care:Routine pressure relief measures. 7. Fluids/Electrolytes/Nutrition:Monitor I/O.  8. HTN: Monitor BP -continue metoprolol.   Lisinopril increased to 20 mg on 06/01, increased to 30 on 6/6, increased to 40 on 6/15  Monitor with increased mobility Vitals:   07/03/19 2012 07/04/19 0505  BP: (!) 168/56 (!) 154/54  Pulse: 72 62  Resp:  16  Temp: 99.8 F (37.7 C) 98.1 F (36.7 C)  SpO2: 97% 100%   9. T2DM with retinopathy: Hgb A1c-8.6. Was on Amaryl, NPH36u am/6u pmand regular insulins--23/23/27at home. Will continue to monitor BS ac/hs.   Levemir bid increased to 13 twice daily on 6/5, decreased to 7 twice daily on 6/15 CBG (last 3)  Recent Labs    07/03/19 1619 07/03/19 2059 07/04/19 0607  GLUCAP 147* 103* 56*   Amaryl started on 6/8, increased on 6/11  Elevated, but improving on 6/14 10. FUO: Resolved Encourage IS.   WBC is within normal notes Dopplers limited,  but negative for DVT  CXR personally reviewed, unremarkable  UA unremarkable, Ucx multiple species 10. ABLA: Likely due to bleed.   Hb 9.9 on 6/14  Cont to monitor 11. Macrocytosis:  Vitamin B12 levels low on 6/4  Supplement initiated 12.  AKI on CKD: Baseline SCr- 1.5-1.6.   Cr.  1.29 on 6/14, labs pending  Echo reviewed, EF of 60-65%  IVF nightly x3 nights started on 6/10  Encourage fluids 13. Interstitial lung disease:IS/OOB 14. Anxiety disorder:On Zoloft. Will continue Seroquel and Klonopin at bedtime. 15.Dyslipidemia: Statin started due to SATURN trial, discussed with Neurology.  16. Hypoalbuminemia  Supplement initiated on 6/4 17.  Hypokalemia  Potassium 3.3 on 6/14, labs pending  Supplemented x1 day  Continue to monitor  LOS: 12 days A FACE TO FACE EVALUATION WAS PERFORMED  Penelope Fittro Lorie Phenix 07/04/2019, 8:41 AM

## 2019-07-04 NOTE — Progress Notes (Signed)
Physical Therapy Session Note  Patient Details  Name: Sandra [name] MRN: [id number] Date of Birth: [birth year]  Today's Date: 07/04/2019 PT Individual Time: 1000-1111 PT Individual Time Calculation (min): 71 min   Short Term Goals: Week 2:  PT Short Term Goal 1 (Week 2): STG = LTG due to remaining ELOS  Skilled Therapeutic Interventions/Progress Updates:    Patient received in bed, asleep but easily woken; had NT do blood sugar recheck due to hypoglycemia earlier and found to be 159 prior to therapy. Tolerated gait training multiple distance of 300+ft with RW/S, and otherwise practiced all aspects of functional mobility including bed mobility, functional transfers, car transfers, picking an item up form floor height, curb and step navigation, and toilet transfers/toileting. Often requires S/VC for hand placement and safe proximity from RW. 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 Left up in [facility] with all needs met, chair alarm active this morning.   Therapy Documentation Precautions:  Precautions Precautions: Fall Precaution Comments: L hemianopsia, L inattention, apraxia, severe back pain (would use back precautions for comfort) Restrictions Weight Bearing Restrictions: No Pain: Pain Assessment Pain Scale: 0-10 Pain Score: 0-No pain    Therapy/Group: Individual Therapy   Windell Norfolk, DPT, PN1   Supplemental Physical Therapist [place]    Pager [phone] Acute Rehab Office [phone]    07/04/2019, 12:14 PM

## 2019-07-05 LAB — GLUCOSE, CAPILLARY
Glucose-Capillary: 104 mg/dL — ABNORMAL HIGH (ref 70–99)
Glucose-Capillary: 118 mg/dL — ABNORMAL HIGH (ref 70–99)

## 2019-07-05 MED ORDER — QUETIAPINE FUMARATE 25 MG PO TABS: 25.0000 mg | ORAL_TABLET | Freq: Every day | ORAL | 0 refills | Status: DC

## 2019-07-05 MED ORDER — ATORVASTATIN CALCIUM 40 MG PO TABS: 40.0000 mg | ORAL_TABLET | Freq: Every day | ORAL | 0 refills | Status: DC

## 2019-07-05 MED ORDER — INSULIN DETEMIR 100 UNIT/ML ~~LOC~~ SOLN: 7.0000 [IU] | Freq: Two times a day (BID) | SUBCUTANEOUS | 0 refills | Status: DC

## 2019-07-05 MED ORDER — CYANOCOBALAMIN 500 MCG PO TABS: 500.0000 ug | ORAL_TABLET | Freq: Every day | ORAL | 0 refills | Status: DC

## 2019-07-05 MED ORDER — TOPIRAMATE 25 MG PO TABS: 25.0000 mg | ORAL_TABLET | Freq: Two times a day (BID) | ORAL | 0 refills | Status: DC

## 2019-07-05 MED ORDER — PANTOPRAZOLE SODIUM 40 MG PO TBEC: 40.0000 mg | DELAYED_RELEASE_TABLET | Freq: Every day | ORAL | 0 refills | Status: DC

## 2019-07-05 MED ORDER — METOPROLOL SUCCINATE ER 50 MG PO TB24: 50.0000 mg | ORAL_TABLET | Freq: Every day | ORAL | 0 refills | Status: DC

## 2019-07-05 MED ORDER — INSULIN DETEMIR 100 UNIT/ML ~~LOC~~ SOLN: 7.0000 [IU] | Freq: Two times a day (BID) | SUBCUTANEOUS | 11 refills | Status: DC

## 2019-07-05 MED ORDER — LISINOPRIL 40 MG PO TABS: 40.0000 mg | ORAL_TABLET | Freq: Every day | ORAL | 0 refills | Status: DC

## 2019-07-05 MED ORDER — FAMOTIDINE 40 MG PO TABS: 20.0000 mg | ORAL_TABLET | Freq: Every day | ORAL | 0 refills | Status: DC

## 2019-07-05 MED ORDER — ACETAMINOPHEN 325 MG PO TABS: 325.0000 mg | ORAL_TABLET | ORAL | Status: DC | PRN

## 2019-07-05 MED ORDER — GLIMEPIRIDE 2 MG PO TABS: 2.0000 mg | ORAL_TABLET | Freq: Every day | ORAL | 0 refills | Status: DC

## 2019-07-05 NOTE — Discharge Instructions (Signed)
  Inpatient Rehab Discharge Instructions  Sandra [name] Discharge date and time: 07/05/19    Activities/Precautions/ Functional Status: Activity: no lifting, driving, or strenuous exercise  till cleared by MD Diet: cardiac diet Wound Care: none needed    Functional status:  ___ No restrictions     ___ Walk up steps independently _X__ 24/7 supervision/assistance   ___ Walk up steps with assistance ___ Intermittent supervision/assistance  ___ Bathe/dress independently ___ Walk with walker     ___ Bathe/dress with assistance ___ Walk Independently    ___ Shower independently ___ Walk with assistance    _X__ Shower with assistance _X__ No alcohol     ___ Return to work/school ________   Special Instructions: 1. Drink plenty of fluids.   COMMUNITY REFERRALS UPON DISCHARGE:     Home Health:   PT     OT                      Agency: [facility] Phone: [phone]                Medical Equipment/Items Ordered: Youth rolling Walker and Bedside Commode                                                 Agency/Supplier: [employer]     My questions have been answered and I understand these instructions. I will adhere to these goals and the provided educational materials after my discharge from the hospital.  Patient/Caregiver Signature _______________________________ Date __________  Clinician Signature _______________________________________ Date __________  Please bring this form and your medication list with you to all your follow-up doctor's appointments.

## 2019-07-05 NOTE — Progress Notes (Signed)
Pt was D/Cd from IR unit. Pt left the unit with all personal belongings and daughter was present. All questions were answered and pt states she will attend all follow up appts.  Doy Hutching, LPN

## 2019-07-05 NOTE — Progress Notes (Signed)
[place] PHYSICAL MEDICINE & REHABILITATION PROGRESS NOTE  Subjective/Complaints: Patient seen sitting up in bed this morning.  He states he slept well overnight.  He seems slightly confused, but is ready for discharge.  ROS: Denies CP, SOB, N/V/D  Objective: Vital Signs: Blood pressure (!) 157/59, pulse 76, temperature 98.3 F (36.8 C), temperature source Oral, resp. rate 16, height 5' (1.524 m), weight 64.1 kg, SpO2 96 %. No results found. Recent Labs    07/03/19 0603  WBC 5.0  HGB 9.9*  HCT 30.7*  PLT 351   Recent Labs    07/03/19 0603 07/04/19 1010  NA 144 140  K 3.3* 4.1  CL 116* 116*  CO2 20* 18*  GLUCOSE 119* 197*  BUN 30* 21  CREATININE 1.29* 1.23*  CALCIUM 8.6* 8.7*    Physical Exam: BP (!) 157/59 (BP Location: Left Arm)   Pulse 76   Temp 98.3 F (36.8 C) (Oral)   Resp 16   Ht 5' (1.524 m)   Wt 64.1 kg   SpO2 96%   BMI 27.60 kg/m  Constitutional: No distress . Vital signs reviewed. HENT: Normocephalic.  Atraumatic. Eyes: EOMI. No discharge. Cardiovascular: No JVD. Respiratory: Normal effort.  No stridor. GI: Non-distended. Skin: Warm and dry.  Intact. Psych: Normal mood.  Normal behavior. Musc: Right elbow with improving tenderness. Neurologic: Alert Motor:  LUE: 4+-5/5 proximal distal, stable LLE: 4+-5/5 proximal distal Left homonymous hemianopsia stable Left upper extremity dysmetria, stable  Assessment/Plan: 1. Functional deficits secondary to right occipital ICH which require 3+ hours per day of interdisciplinary therapy in a comprehensive inpatient rehab setting.  Physiatrist is providing close team supervision and 24 hour management of active medical problems listed below.  Physiatrist and rehab team continue to assess barriers to discharge/monitor patient progress toward functional and medical goals  Care Tool:  Bathing    Body parts bathed by patient: Right arm, Left arm, Chest, Abdomen, Front perineal area, Buttocks, Right  upper leg, Left upper leg, Right lower leg, Left lower leg, Face   Body parts bathed by helper: Front perineal area, Buttocks     Bathing assist Assist Level: Supervision/Verbal cueing     Upper Body Dressing/Undressing Upper body dressing   What is the patient wearing?: Pull over shirt    Upper body assist Assist Level: Set up assist    Lower Body Dressing/Undressing Lower body dressing      What is the patient wearing?: Pants, Incontinence brief     Lower body assist Assist for lower body dressing: Supervision/Verbal cueing     Toileting Toileting    Toileting assist Assist for toileting: Supervision/Verbal cueing     Transfers Chair/bed transfer  Transfers assist     Chair/bed transfer assist level: Supervision/Verbal cueing     Locomotion Ambulation   Ambulation assist   Ambulation activity did not occur: Safety/medical concerns (severe back pain)  Assist level: Supervision/Verbal cueing Assistive device: Walker-rolling Max distance: 150'   Walk 10 feet activity   Assist  Walk 10 feet activity did not occur: Safety/medical concerns (severe back pain)  Assist level: Supervision/Verbal cueing Assistive device: Walker-rolling   Walk 50 feet activity   Assist Walk 50 feet with 2 turns activity did not occur: Safety/medical concerns (severe back pain)  Assist level: Supervision/Verbal cueing Assistive device: Walker-rolling    Walk 150 feet activity   Assist Walk 150 feet activity did not occur: Safety/medical concerns (severe back pain)  Assist level: Supervision/Verbal cueing Assistive device: Walker-rolling  Walk 10 feet on uneven surface  activity   Assist Walk 10 feet on uneven surfaces activity did not occur: Safety/medical concerns (severe back pain)   Assist level: Contact Guard/Touching assist Assistive device: Aeronautical engineer Will patient use wheelchair at discharge?: No Type of Wheelchair:   (functional ambulator) Wheelchair activity did not occur: N/A (functional ambulator)  Wheelchair assist level: Contact Guard/Touching assist Max wheelchair distance: 157ft    Wheelchair 50 feet with 2 turns activity    Assist    Wheelchair 50 feet with 2 turns activity did not occur: N/A (functional ambulator)   Assist Level: Independent   Wheelchair 150 feet activity     Assist Wheelchair 150 feet activity did not occur: N/A (functional ambulator)   Assist Level: Minimal Assistance - Patient > 75%      Medical Problem List and Plan: 1.Left sided weakness and visual-spatial, cognitive deficitssecondary to right occipital ICH (hypertensive vs amyloid angiopathy). Pt with some cognitive decline prior to hospitalization as well. Left homonymous hemianopsia   DC today  Will see patient for transitional care management in 1-2 weeks post-discharge 2. Antithrombotics: -DVT/anticoagulation:Pharmaceutical:Lovenoxadded -antiplatelet therapy: N/A due to bleed. 3.Chronic LBP/Headaches/Pain Management: Tylenol prn  Vascular HA post ICH, added topiramate with improvement  Controlled on 6/16 4. Mood:LCSW to follow for evaluation and support. -antipsychotic agents: N/a 5. Neuropsych: This patientis not fullycapable of making decisions on herown behalf. 6. Skin/Wound Care:Routine pressure relief measures. 7. Fluids/Electrolytes/Nutrition:Monitor I/O.  8. HTN: Monitor BP -continue metoprolol.   Lisinopril increased to 20 mg on 06/01, increased to 30 on 6/6, increased to 40 on 6/15  Monitor with increased mobility Vitals:   07/04/19 1949 07/05/19 0513  BP: (!) 154/58 (!) 157/59  Pulse: 72 76  Resp: 16 16  Temp: 98.4 F (36.9 C) 98.3 F (36.8 C)  SpO2: 98% 96%   Elevated,?  Improving on 6/16-continue ambulatory monitoring with potential further adjustments 9. T2DM with retinopathy: Hgb A1c-8.6. Was on Amaryl, NPH36u am/6u pmand  regular insulins--23/23/27at home. Will continue to monitor BS ac/hs.   Levemir bid increased to 13 twice daily on 6/5, decreased to 7 twice daily on 6/15 CBG (last 3)  Recent Labs    07/04/19 1702 07/04/19 2116 07/05/19 0621  GLUCAP 124* 127* 104*   Amaryl started on 6/8, increased on 6/11  Improving on 6/16-continue ambulatory monitoring with further adjustments as necessary 10. FUO: Resolved Encourage IS.   WBC is within normal notes Dopplers limited, but negative for DVT  CXR personally reviewed, unremarkable  UA unremarkable, Ucx multiple species 10. ABLA: Likely due to bleed.   Hb 9.9 on 6/14  Cont to monitor 11. Macrocytosis:  Vitamin B12 levels low on 6/4  Supplement initiated 12.  AKI on CKD: Baseline SCr- 1.5-1.6.   Cr.  1.23 on 6/15  Echo reviewed, EF of 60-65%  IVF nightly x3 nights started on 6/10  Encourage fluids 13. Interstitial lung disease:IS/OOB 14. Anxiety disorder:On Zoloft. Will continue Seroquel and Klonopin at bedtime. 15.Dyslipidemia: Statin started due to SATURN trial, discussed with Neurology.  16. Hypoalbuminemia  Supplement initiated on 6/4 17.  Hypokalemia  Potassium 4.1 on 6/15  Supplemented   Continue to monitor  > 30 minutes spent in total in discharge planning between myself and PA regarding aforementioned, as well discussion regarding DME equipment, follow-up appointments, follow-up therapies, discharge medications, discharge recommendations  LOS: 13 days A FACE TO FACE EVALUATION WAS PERFORMED  Syretta Kochel Lorie Phenix 07/05/2019, 9:10 AM

## 2019-07-05 NOTE — Progress Notes (Signed)
Inpatient Rehabilitation Care Coordinator  Discharge Note  The overall goal for the admission was met for:   Discharge location: Yes  Length of Stay: Yes  Discharge activity level: Yes  Home/community participation: Yes  Services provided included: MD, RD, PT, OT, SLP, RN, CM, TR, Pharmacy and SW  Financial Services: Private Insurance: [employer]  Follow-up services arranged: Home Health: [facility]   Comments (or additional information):  Patient/Family verbalized understanding of follow-up arrangements: Yes  Individual responsible for coordination of the follow-up plan: joy [phone]  Confirmed correct DME delivered:  J  07/05/2019     J  

## 2019-07-06 NOTE — Discharge Summary (Addendum)
Physician Discharge Summary  Patient ID: Sandra [name] MRN: [id number] DOB/AGE: May 02, 1933 86 y.o.  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 Limitations        Today's exam was limited due to the high bifurcation of the                    carotid and poor penetration. Comparison Study:  04/11/18 Performing Technologist: Antonieta Pert RDMS, RVT  Examination Guidelines: A complete evaluation includes B-mode imaging, spectral Doppler, color Doppler, and power Doppler as needed of all accessible portions of each vessel. Bilateral testing is considered an integral part of a complete examination. Limited examinations for reoccurring indications may be performed as noted.  Right Carotid Findings: +----------+--------+--------+--------+------------------+--------+           PSV cm/sEDV cm/sStenosisPlaque DescriptionComments +----------+--------+--------+--------+------------------+--------+ CCA Prox  96      16                                         +----------+--------+--------+--------+------------------+--------+ CCA Distal75      15                                         +----------+--------+--------+--------+------------------+--------+ ICA Prox  107     25                                         +----------+--------+--------+--------+------------------+--------+ ICA Mid   88      20                                          +----------+--------+--------+--------+------------------+--------+ ICA Distal56      23                                         +----------+--------+--------+--------+------------------+--------+ ECA       108     14                                         +----------+--------+--------+--------+------------------+--------+ +----------+--------+-------+--------+-------------------+           PSV cm/sEDV cmsDescribeArm Pressure (mmHG) +----------+--------+-------+--------+-------------------+ [id number]                                        +----------+--------+-------+--------+-------------------+ +---------+--------+--+--------+--+---------+ VertebralPSV cm/s82EDV cm/s21Antegrade +---------+--------+--+--------+--+---------+ difficult to evaluate  plaque due to poor 2D images Left Carotid Findings: +----------+--------+--------+--------+------------------+--------+           PSV cm/sEDV cm/sStenosisPlaque DescriptionComments +----------+--------+--------+--------+------------------+--------+ CCA Prox  92      13                                         +----------+--------+--------+--------+------------------+--------+ CCA Distal79      14                                         +----------+--------+--------+--------+------------------+--------+ ICA Prox  70      17                                         +----------+--------+--------+--------+------------------+--------+ ICA Mid   85      21                                         +----------+--------+--------+--------+------------------+--------+ ICA Distal90      27                                         +----------+--------+--------+--------+------------------+--------+ ECA       120     22                                         +----------+--------+--------+--------+------------------+--------+ +----------+--------+--------+--------+-------------------+           PSV  cm/sEDV cm/sDescribeArm Pressure (mmHG) +----------+--------+--------+--------+-------------------+ [id number]                                         +----------+--------+--------+--------+-------------------+ +---------+--------+--+--------+--+---------+ VertebralPSV cm/s46EDV cm/s10Antegrade +---------+--------+--+--------+--+---------+ difficult to evaluate plaque due to poor 2D images  Summary: Right Carotid: Velocities in the right ICA are consistent with a 1-39% stenosis. Left Carotid: Velocities in the left ICA are consistent with a 1-39% stenosis. Vertebrals: Bilateral vertebral arteries demonstrate antegrade flow. *See table(s) above for measurements and observations.  Electronically signed by Antony Contras MD on 06/21/2019 at 8:28:26 AM.    Final    VAS [place] LOWER EXTREMITY VENOUS (DVT)  Result Date: 06/23/2019  Lower Venous DVTStudy Indications: Fever. Stroke.  Limitations: Body habitus, poor ultrasound/tissue interface and patient position. Unable to tolerate compression maneuvers. Significant tremor present. Comparison Study: No prior study Performing Technologist: Maudry Mayhew MHA, RDMS, RVT, RDCS  Examination Guidelines: A complete evaluation includes B-mode imaging, spectral Doppler, color Doppler, and power Doppler as needed of all accessible portions of each vessel. Bilateral testing is considered an integral part of a complete examination. Limited examinations for reoccurring indications may be performed as noted. The reflux portion of the exam is performed with the patient in reverse Trendelenburg.  +---------+---------------+---------+-----------+----------+--------------+ RIGHT    CompressibilityPhasicitySpontaneityPropertiesThrombus Aging +---------+---------------+---------+-----------+----------+--------------+ CFV      Full  Yes      Yes                                 +---------+---------------+---------+-----------+----------+--------------+  SFJ      Full                                                        +---------+---------------+---------+-----------+----------+--------------+ FV Prox  Full                                                        +---------+---------------+---------+-----------+----------+--------------+ FV Mid                           Yes                                 +---------+---------------+---------+-----------+----------+--------------+ FV Distal                        Yes                                 +---------+---------------+---------+-----------+----------+--------------+ PFV      Full                                                        +---------+---------------+---------+-----------+----------+--------------+ POP                     Yes      Yes                                 +---------+---------------+---------+-----------+----------+--------------+   Right Technical Findings: Not visualized segments include PTV/Peroneal veins.  +-------+---------------+---------+-----------+----------+--------------+ LEFT   CompressibilityPhasicitySpontaneityPropertiesThrombus Aging +-------+---------------+---------+-----------+----------+--------------+ CFV    Full           Yes      Yes                                 +-------+---------------+---------+-----------+----------+--------------+ SFJ    Full                                                        +-------+---------------+---------+-----------+----------+--------------+ FV Prox                        Yes                                 +-------+---------------+---------+-----------+----------+--------------+  PFV                            Yes                                 +-------+---------------+---------+-----------+----------+--------------+   Left Technical Findings: Not visualized segments include limited visualization femoral vein, PTV, peroneal veins. Popliteal vein not  visualized.   Summary: RIGHT: - There is no obvious evidence of acute deep vein thrombosis in the lower extremity. However, portions of this examination were limited- see technologist comments above.  - No cystic structure found in the popliteal fossa.  LEFT: - There is no obvious evidence of acute deep vein thrombosis in the lower extremity. However, portions of this examination were limited- see technologist comments above.  - No cystic structure found in the popliteal fossa.  *See table(s) above for measurements and observations. Electronically signed by Ruta Hinds MD on 06/23/2019 at 7:38:27 PM.    Final     Labs:  Basic Metabolic Panel: Recent Labs  Lab 06/29/19 0639 07/03/19 0603 07/04/19 1010  NA 139 144 140  K 3.5 3.3* 4.1  CL 107 116* 116*  CO2 22 20* 18*  GLUCOSE 207* 119* 197*  BUN 62* 30* 21  CREATININE 1.63* 1.29* 1.23*  CALCIUM 9.0 8.6* 8.7*    CBC: CBC Latest Ref Rng & Units 07/03/2019 06/26/2019 06/24/2019  WBC 4.0 - 10.5 K/uL 5.0 4.4 6.8  Hemoglobin 12.0 - 15.0 g/dL 9.9(L) 10.1(L) 9.6(L)  Hematocrit 36 - 46 % 30.7(L) 30.7(L) 28.6(L)  Platelets 150 - 400 K/uL 351 255 216    CBG: Recent Labs  Lab 07/04/19 1113 07/04/19 1702 07/04/19 2116 07/05/19 0621 07/05/19 1115  GLUCAP 176* 124* 127* 104* 118*    Brief HPI:   Sandra [name] is a 86 y.o. female with history of prior hemorrhagic stroke/TIA, T2DM with retinopathy, CKD, ILD, chronic low back pain, recurrent falls most recent 06/17/2019 with reports of persistent headache and right elbow pain.  She presented to the ED that evening but left due to wait time.  She was readmitted on 06/18/2019 with reports of confusion, headaches, dizziness and difficulty walking.  She was found to have left hemianopsia with mild neglect and left-sided weakness due to right posterior temporal lobe hematoma containing methemoglobin, small SDH with evidence of prior microhemorrhages in left frontal and left parietal lobe likely due to  hypertensive bleed.  Neurology question hypertension versus trauma versus amyloid as cause of bleed.  ASA was discontinued and Cleviprex added with goals to keep SBP less than 150.  MRA was negative for stenosis, aneurysm or vascular malformation.  She did develop fever up to 102 on early a.m. of 06/03 and UA was ordered for work-up.  She continued to have limitations due to headaches, balance deficits, left inattention with right gaze preference as well as decreased awareness of deficits.  CIR was recommended due to functional decline   Hospital Course: Sandra [name] was admitted to rehab 06/22/2019 for inpatient therapies to consist of PT, ST and OT at least three hours five days a week. Past admission physiatrist, therapy team and rehab RN have worked together to provide customized collaborative inpatient rehab.   Full work-up ordered for FUO with chest x-ray and urine culture which was negative for infection.  BLE Dopplers were negative for DVT.    Topiramate was added to help manage  vascular headaches with improvement in symptoms.  Blood pressures were monitored on TID basis and lisinopril has been titrated upwards for tighter control.  Her diabetes has been monitored with ac/hs CBG checks and SSI was use prn for tighter BS control.  Levemir was titrated upwards for tighter control and Amaryl increased to 2 mg.  She did have issues with hypoglycemia prior to discharge therefore Levemir was decreased to 7 mg bid.   Patient's daughter did report that blood sugars are better controlled on current regimen and patient was advised to continue current regiment at discharge.    Protein supplements were added due to hypoalbuminemia.  She was found to have mild macrocytosis with low vitamin B12 levels and B12 supplement added.  Follow-up CBC shows white count to be stable without leukocytosis.  H&H has been relatively stable.  Check of BMET showed that acute on chronid renal failure has resolved with IV fluids x3  nights.  Transient hypokalemia has resolved with brief supplementation.  She continues on low dose Klonopin and Seroquel at bedtime to help manage anxiety and insomnia. She has made good progress but continues to require supervision due to fluctuating cognition with STM deficits and left hemianopsia with left inattention. [facility] to provide [place], [place] after discharge.    Rehab course: During patient's stay in rehab weekly team conferences were held to monitor patient's progress, set goals and discuss barriers to discharge. At admission, patient required mod assist with ADL task and mod to max assist with mobility. She demonstrated mild to moderate cognitive impairment affecting attention, problem-solving and recall affecting safety with functional and familiar tasks. She  has had improvement in activity tolerance, balance, postural control as well as ability to compensate for deficits. She has had improvement in functional use LUE  and LLE as well as improvement in awareness. She is able to complete ADL tasks with supervision.  She requires supervision for transfers and supervision with verbal cues to ambulate 325 feet with rolling walker.  She is able to climb 12 stairs with supervision and cues and requires min assist to navigate curb.  She requires min assist with verbal and visual cues for semicomplex tasks.  Repeat MoCA score at 20/30 and min assist recommended with all cognitive tasks. Family education was completed regarding all aspects of safety and mobility.  Family was not present for education with speech therapy and written instructions regarding memory strategies were provided.  Family also instructed on assisting with medication and financial management.   Discharge disposition: 01-Home or Self Care.  Diet: Carb modified/heart healthy  Special Instructions: 1.  Monitor blood sugars AC at bedtime and follow-up with PCP for further adjustment. 2. Continue to increase fluid  intake.   Discharge Instructions     Ambulatory referral to Physical Medicine Rehab   Complete by: As directed    1-2 weeks TC appt      Allergies as of 07/05/2019       Reactions   Cephalexin Nausea And Vomiting   Pt stated made severely sick, will never take again   Nintedanib Diarrhea   Pioglitazone Other (See Comments)   REACTION: EDEMA        Medication List     STOP taking these medications    Chlorhexidine Gluconate Cloth 2 % Pads   insulin aspart 100 UNIT/ML injection Commonly known as: novoLOG   senna-docusate 8.6-50 MG tablet Commonly known as: Senokot-S   sertraline 50 MG tablet Commonly known as: ZOLOFT  sodium chloride 0.9 % infusion       TAKE these medications    acetaminophen 325 MG tablet Commonly known as: TYLENOL Take 1-2 tablets (325-650 mg total) by mouth every 4 (four) hours as needed for mild pain.   atorvastatin 40 MG tablet Commonly known as: LIPITOR Take 1 tablet (40 mg total) by mouth daily.   clonazePAM 0.5 MG disintegrating tablet Commonly known as: KLONOPIN Take 1 tablet (0.5 mg total) by mouth at bedtime.   famotidine 40 MG tablet Commonly known as: PEPCID Take 0.5 tablets (20 mg total) by mouth daily. What changed: how much to take   glimepiride 2 MG tablet Commonly known as: AMARYL Take 1 tablet (2 mg total) by mouth daily with breakfast.   insulin detemir 100 UNIT/ML injection Commonly known as: LEVEMIR Inject 0.07 mLs (7 Units total) into the skin 2 (two) times daily. What changed: how much to take   lisinopril 40 MG tablet Commonly known as: ZESTRIL Take 1 tablet (40 mg total) by mouth daily. What changed:  medication strength how much to take   metoprolol succinate 50 MG 24 hr tablet Commonly known as: TOPROL-XL Take 1 tablet (50 mg total) by mouth daily. Take with or immediately following a meal. What changed: See the new instructions.   pantoprazole 40 MG tablet Commonly known as: PROTONIX Take  1 tablet (40 mg total) by mouth daily.   QUEtiapine 25 MG tablet Commonly known as: SEROQUEL Take 1 tablet (25 mg total) by mouth at bedtime.   topiramate 25 MG tablet Commonly known as: TOPAMAX Take 1 tablet (25 mg total) by mouth 2 (two) times daily.   vitamin B-12 500 MCG tablet Commonly known as: CYANOCOBALAMIN Take 1 tablet (500 mcg total) by mouth daily.        Follow-up Information     Jamse Arn, MD Follow up.   Specialty: Physical Medicine and Rehabilitation Why: Office will call you with follow up appointment Contact information: [place] [address] [phone]         [facility]. Call on 07/06/2019.   Why: For post stroke follow up. Contact information: [address]     [place] [address] [phone]        Nickola Major, MD. Call.   Specialty: Family Medicine Why: for post hospital follow up Contact information: [address] [place] [address] [place] [address] [phone]                 Signed: Bary Leriche 07/06/2019, 12:18 AM Patient was seen, face-face, and physical exam performed by me on day of discharge, greater than 30 minutes of total time spent.. Please see progress note from day of discharge as well.  Delice Lesch, MD, ABPMR

## 2019-07-18 ENCOUNTER — Encounter: Payer: Medicare HMO | Admitting: Physical Medicine & Rehabilitation

## 2019-07-25 ENCOUNTER — Ambulatory Visit: Payer: Medicare HMO | Admitting: Adult Health

## 2019-08-05 ENCOUNTER — Other Ambulatory Visit: Payer: Self-pay | Admitting: Physical Medicine and Rehabilitation

## 2019-08-07 ENCOUNTER — Encounter: Payer: Self-pay | Admitting: Adult Health

## 2019-08-07 ENCOUNTER — Other Ambulatory Visit: Payer: Self-pay

## 2019-08-07 ENCOUNTER — Ambulatory Visit: Payer: Medicare HMO | Admitting: Adult Health

## 2019-08-07 VITALS — BP 152/68 | HR 59 | Ht 59.0 in | Wt 133.0 lb

## 2019-08-07 DIAGNOSIS — E1165 Type 2 diabetes mellitus with hyperglycemia: Secondary | ICD-10-CM

## 2019-08-07 DIAGNOSIS — I10 Essential (primary) hypertension: Secondary | ICD-10-CM

## 2019-08-07 DIAGNOSIS — R4189 Other symptoms and signs involving cognitive functions and awareness: Secondary | ICD-10-CM | POA: Diagnosis not present

## 2019-08-07 DIAGNOSIS — I611 Nontraumatic intracerebral hemorrhage in hemisphere, cortical: Secondary | ICD-10-CM | POA: Diagnosis not present

## 2019-08-07 DIAGNOSIS — E785 Hyperlipidemia, unspecified: Secondary | ICD-10-CM

## 2019-08-07 NOTE — Patient Instructions (Addendum)
Recommend use of home health therapy for residual stroke symptoms  Follow up with eye doctor and please ensure peripheral vision testing to be completed - please have note faxed to office once completed   Repeat imaging to assess for underlying causes of recent stroke and evaluation of resolution of bleed  If resolved, may consider restarting aspirin - will discuss with Dr. Leonie Man prior to starting  Continue to follow up with PCP regarding cholesterol, diabetes and blood pressure management   Maintain strict control of hypertension with blood pressure goal below 130/90, diabetes with hemoglobin A1c goal below 6.5% and cholesterol with LDL cholesterol (bad cholesterol) goal below 70 mg/dL. I also advised the patient to eat a healthy diet with plenty of whole grains, cereals, fruits and vegetables, exercise regularly and maintain ideal body weight.  Followup in the future with me in 3 months or call earlier if needed       Thank you for coming to see [place] at [facility]. I hope we have been able to provide you high quality care today.  You may receive a patient satisfaction survey over the next few weeks. We would appreciate your feedback and comments so that we may continue to improve ourselves and the health of our patients.

## 2019-08-07 NOTE — Progress Notes (Signed)
[facility] [address] [place]. [place] [address] [phone]       [place] Sandra [name] Date of Birth:  March 12, 1933 Medical Record Number:  [id number]   Reason for Referral:  hospital stroke follow up    SUBJECTIVE:   CHIEF COMPLAINT:  Chief Complaint  Patient presents with  . Follow-up    tx rm pt here for a hosp f/u.    HPI:   SandraSandra [name] a 86 y.o.femalewith history of HTN, MI, CKD3, DM2, depression, previous strokes and ICH with mild cognitive decline over last 1-26yr (mRS 1) presented on 06/18/2019 with headache. CT with R occipital ICH.  Stroke work-up revealed right occipital ICH in setting of mild cognitive decline, etiology possibly hypertensive versus amyloid angiopathy.  MRA unremarkable no evidence of aneurysm or AVM.  Previously on aspirin 81 mg daily and recommended holding dosage due to hemorrhage.  History of HTN discharged on metoprolol 50 mg and increase lisinopril to 20 mg daily.  LDL 76 and recommended holding statin in setting of hemorrhage.  Patient enrolled in SATURN trial through [facility].  Uncontrolled DM with A1c 8.6.  Underlying cognitive impairment with use of clonazepam and Seroquel nightly.  Prior stroke history 11/2010 left parietal convexity SAH, 03/2018 right occipital infarct and 02/2019 TIA vs seizure vs complicated migraine.  Other stroke risk factors include advanced age, EtOH use, family history of stroke and takotsubo cardiomyopathy.  Evaluated by therapy and recommended discharge to CIR for ongoing therapy needs.  Stroke: R occipital ICH in setting of mild cognitive decline - could be hypertensive vs. amyloid angiopathy  CT head right temporoparietal hemorrhage.B Small vessel disease. Sinus dz  MRI R posterior temporoparietal lobe ICH with SDH, no shift, mild L frontal lobe and L parietal lobe chronic microhemorrhages  MRA head unremarkable, no aneurysm or  AVM  2D EchoEF 60-65%. No source of embolus  Carotid Doppler unremarkable  LDL76  HgbA1c8.6  SCDs for VTE prophylaxis  aspirin 81 mg dailyprior to admission, now on No antithromboticgiven hemorrhage   Therapy recommendations: CIR  Disposition: CIR  Today, 08/07/2019, Sandra [name] is being seen for hospital follow-up accompanied by her daughter.  Discharged home from [place] on 07/05/2019.  Residual deficits of worsened cognitive impairment from baseline which has been slowly improving.  She does question being told of visual impairment during admission -denies peripheral visual loss but does report worsening vision reading up close.  Has ophthalmology appointment scheduled in the near future.  Denies participation in home health or outpatient therapy since CIR discharge.  Currently living with daughter.  Denies new or worsening stroke/TIA symptoms.  Blood pressure today 152/68 -routinely monitors at home and typically stable.  Topamax initiated during CIR for vascular headaches which have continued to be stable.  Tolerating Topamax without side effects.  PCP recently recommended restarting atorvastatin but she is hesitant due to recent weight.  Continues Klonopin at night but does not routinely take Seroquel.  No behavioral related concerns.  Recently had follow-up with PCP with adjustment to diabetic regimen.  No further concerns at this time.    ROS:   14 system review of systems performed and negative with exception of memory loss, confusion, decreased visual acuity  PMH:  Past Medical History:  Diagnosis Date  . Abnormal blood finding 02/10/2016  . Abrasion of ear canal 09/28/2012  . Achilles tendon injury 11/27/2010  . ACID REFLUX DISEASE 05/12/2006   Qualifier: Diagnosis of  By: Larose Kells  MD, [place] Acute MI ([place])   . ALLERGIC RHINITIS 05/12/2006   Qualifier: Diagnosis of  By: Larose Kells MD, [place] Anxiety state 07/03/2009   Qualifier: Diagnosis of  By: Larose Kells MD, [place]  pain 07/01/2010  . Chronic kidney disease (CKD), stage III (moderate) ([place]) 05/31/2014  . Chronic right SI joint pain 09/22/2013  . DEGENERATIVE JOINT DISEASE, CERVICAL SPINE 06/23/2006   Annotation: had a CAT scan with a cervical myelogram that show  left C6-7 and  C5-6 foraminal stenosis Qualifier: Diagnosis of  By: Larose Kells MD, [place] DEPRESSION 05/12/2006   Qualifier: Diagnosis of  By: Larose Kells MD, [place] DIABETES MELLITUS, TYPE II 05/12/2006   Now following w/ Endo at [place] 04/27/2007   Qualifier: Diagnosis of  By: Jerold Coombe    . Dyspnea 02/10/2016  . Encephalopathy, hypertensive   . Essential hypertension 05/12/2006   Qualifier: Diagnosis of  By: Larose Kells MD, [place] Fatigue 05/31/2014  . GAIT DISTURBANCE 08/07/2009   Qualifier: Diagnosis of  By: Larose Kells MD, [place] GERD (gastroesophageal reflux disease)   . Headache(784.0) 11/27/2010  . Hyperlipidemia   . Hyperlipidemia associated with type 2 diabetes mellitus ([place]) 05/12/2006   Qualifier: Diagnosis of  By: Larose Kells MD, [place] ILD (interstitial lung disease) ([place]) 05/31/2015  . Leukocytopenia 05/31/2014  . Lower abdominal pain 05/17/2014  . Nausea with vomiting 09/22/2013  . NECK PAIN, CHRONIC 04/03/2008   Qualifier: Diagnosis of  By: Larose Kells MD, [place] Nodule on liver 12/06/2015  . Osteoarthritis 02/10/2016  . Osteopenia   . Osteoporosis 06/23/2006   Annotation: had a bone density test in 08-2004.  T score was -2.4 Qualifier: Diagnosis of  By: Larose Kells MD, [place] SAH (subarachnoid hemorrhage) ([place])   . Sciatica   . Scleroderma ([place]) 02/28/2016  . Severe hypertension 03/18/2018  . Slurred speech 11/27/2010  . Takotsubo cardiomyopathy    s/p stress MI with stress induced CM with normal coronary arteries by cath 2007 with normalization of LVF by echo 04/2005  . TIA (transient ischemic attack)   . Unspecified cerebral artery occlusion with cerebral infarction 04/07/2012  . UTI (urinary tract infection) 11/02/2011    . 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 Vitamin D deficiency 10/01/2014    PSH:  Past Surgical History:  Procedure Laterality Date  . ABDOMINAL HYSTERECTOMY  1977   no oophorectomy  . APPENDECTOMY    . CARDIAC CATHETERIZATION    . CATARACT EXTRACTION, BILATERAL  2011  . SPINAL FUSION  2000   Dr. Vertell Limber  . TONSILLECTOMY      Social History:  Social History   Socioeconomic History  . Marital status: Divorced    Spouse name: Not on file  . Number of children: 2  . Years of education: Not on file  . Highest education level: Not on file  Occupational History  . Occupation: Retired    Comment: Press photographer  Tobacco Use  . Smoking status: Passive Smoke Exposure - Never Smoker  . Smokeless tobacco: Never Used  . Tobacco comment: Mother & Children  Vaping Use  . Vaping Use: Never used  Substance and Sexual Activity  . Alcohol use: Yes    Alcohol/week: 1.0 standard drink    Types: 1 Glasses of wine per week  Comment: occ  . Drug use: No  . Sexual activity: Never  Other Topics Concern  . Not on file  Social History Narrative   Lives with her daughter and G-daughter   Diet- working on portion control   Exercise- no routine exercise but active      [place] [facility]:   Originally from [place]. Always lived in [place]. Previously has traveled to [place], [place], [place], [place], & [place]. Previously did accounting and clerical work. Has a dog currently. Remote exposure to a parakeet. No mold exposure.    Social Determinants of Health   Financial Resource Strain:   . Difficulty of Paying Living Expenses:   Food Insecurity:   . Worried About Charity fundraiser in the Last Year:   . Arboriculturist in the Last Year:   Transportation Needs:   . Film/video editor (Medical):   Marland Kitchen Lack of Transportation (Non-Medical):   Physical Activity:   . Days of Exercise per Week:   . Minutes of Exercise per Session:   Stress:   . Feeling of Stress :   Social Connections:   . Frequency of  Communication with Friends and Family:   . Frequency of Social Gatherings with Friends and Family:   . Attends Religious Services:   . Active Member of Clubs or Organizations:   . Attends Archivist Meetings:   Marland Kitchen Marital Status:   Intimate Partner Violence:   . Fear of Current or Ex-Partner:   . Emotionally Abused:   Marland Kitchen Physically Abused:   . Sexually Abused:     Family History:  Family History  Problem Relation Age of Onset  . Intracerebral hemorrhage Daughter        assoc with post-partum  . Stroke Mother   . Diabetes Sister   . Asthma Sister   . Diabetes Sister   . Breast cancer Other        ?Aunt  . Cancer Other        breast?  . Coronary artery disease Neg Hx     Medications:   Current Outpatient Medications on File Prior to Visit  Medication Sig Dispense Refill  . acetaminophen (TYLENOL) 325 MG tablet Take 1-2 tablets (325-650 mg total) by mouth every 4 (four) hours as needed for mild pain.    Marland Kitchen atorvastatin (LIPITOR) 40 MG tablet Take 1 tablet (40 mg total) by mouth daily. 30 tablet 0  . clonazePAM (KLONOPIN) 0.5 MG disintegrating tablet Take 1 tablet (0.5 mg total) by mouth at bedtime. 60 tablet 0  . famotidine (PEPCID) 40 MG tablet Take 0.5 tablets (20 mg total) by mouth daily. 15 tablet 0  . glimepiride (AMARYL) 2 MG tablet Take 1 tablet (2 mg total) by mouth daily with breakfast. 30 tablet 0  . insulin detemir (LEVEMIR) 100 UNIT/ML injection Inject 0.07 mLs (7 Units total) into the skin 2 (two) times daily. 10 mL 0  . lisinopril (ZESTRIL) 40 MG tablet Take 1 tablet (40 mg total) by mouth daily. 30 tablet 0  . metoprolol succinate (TOPROL-XL) 50 MG 24 hr tablet Take 1 tablet (50 mg total) by mouth daily. Take with or immediately following a meal. 30 tablet 0  . pantoprazole (PROTONIX) 40 MG tablet Take 1 tablet (40 mg total) by mouth daily. 30 tablet 0  . QUEtiapine (SEROQUEL) 25 MG tablet Take 1 tablet (25 mg total) by mouth at bedtime. 30 tablet 0  .  topiramate (TOPAMAX) 25 MG tablet Take  1 tablet (25 mg total) by mouth 2 (two) times daily. 60 tablet 0  . vitamin B-12 (CYANOCOBALAMIN) 500 MCG tablet Take 1 tablet (500 mcg total) by mouth daily. 30 tablet 0   No current facility-administered medications on file prior to visit.    Allergies:   Allergies  Allergen Reactions  . Cephalexin Nausea And Vomiting    Pt stated made severely sick, will never take again  . Nintedanib Diarrhea  . Pioglitazone Other (See Comments)    REACTION: EDEMA      OBJECTIVE:  Physical Exam  Vitals:   08/07/19 1522  BP: (!) 152/68  Pulse: (!) 59  Weight: 133 lb (60.3 kg)  Height: 4\' 11"  (1.499 m)   Body mass index is 26.86 kg/m. No exam data present  General: well developed, well nourished,  pleasant elderly Caucasian female, seated, in no evident distress Head: head normocephalic and atraumatic.   Neck: supple with no carotid or supraclavicular bruits Cardiovascular: regular rate and rhythm, no murmurs Musculoskeletal: no deformity Skin:  no rash/petichiae Vascular:  Normal pulses all extremities   Neurologic Exam Mental Status: Awake and fully alert.   Fluent speech and language.  Oriented to place and time. Recent and remote memory impaired. Attention span, concentration and fund of knowledge mostly appropriate during visit but would frequently speak off topic. Mood and affect appropriate.  Cranial Nerves: Fundoscopic exam reveals sharp disc margins. Pupils equal, briskly reactive to light. Extraocular movements full without nystagmus. Visual fields full to confrontation (unable to appreciate hemianopia). Hearing intact. Facial sensation intact. Face, tongue, palate moves normally and symmetrically.  Motor: Normal bulk and tone. Normal strength in all tested extremity muscles. Sensory.: intact to touch , pinprick , position and vibratory sensation.  Coordination: Rapid alternating movements normal in all extremities. Finger-to-nose and  heel-to-shin performed accurately bilaterally. Gait and Station: Arises from chair without difficulty. Stance is normal. Gait demonstrates normal stride length and balance Reflexes: 1+ and symmetric. Toes downgoing.     NIHSS  0 Modified Rankin  2      ASSESSMENT/PLAN: Sandra [name] is a 86 y.o. year old female presented with headache on 06/18/2019 with evidence of right occipital ICH possibly hypertensive versus amyloid angiopathy. Vascular risk factors include HTN, HLD, DM, prior strokes and ICH, cognitive decline and depression/anxiety.     1. R occipital ICH:  -Residual worsening baseline cognition.  Discussed possible benefit with home health therapy and she plans on calling insurance company to ensure coverage and will call office if interested in pursuing -?  Visual impairment -advised to follow-up with ophthalmology and ensure peripheral vision test completed and to have ophthalmology office fax office notes after appointment -Obtain MRI wo contrast to evaluate resolution of bleed and possible underlying etiology.  Unable to obtain MRI w/wo contrast due to history of CKD -If MRI shows resolution, will consider restarting aspirin 81 mg daily for secondary stroke prevention with prior stroke history and cardiovascular risk factors -Ensure close PCP follow-up for aggressive stroke risk factor management of HTN, HLD and DM 2. Cognitive impairment: Worsened compared to baseline.  Questionable CAA. encouraged speech therapy and memory exercises at home as well as ensuring good sleep hygiene, daily activity/exercise and adequate diet.  We will further evaluate at follow-up visit 3. HTN: BP goal<130/90.  Continue to monitor at home and ongoing follow-up with PCP for monitoring management 4. HLD: LDL goal<70.  Recent LDL 76.  PCP recently recommended restarting atorvastatin.  Educated patient and daughter on reasoning  why atorvastatin placed on hold during admission and was not the cause of  her recent [place].  Encouraged restarting as recommended. 5. DMII: A1c goal<7.0.  Continue to follow PCP for monitoring management    Follow up in 3 months or call earlier if needed   I spent 45 minutes of face-to-face and non-face-to-face time with patient and daughter.  This included previsit chart review, lab review, study review, order entry, electronic health record documentation, patient education regarding recent stroke, residual deficits, importance of managing stroke risk factors and answered all questions to patient satisfaction   Sandra [name], [facility]  [facility] [address] [place], [place] [address]  Phone [phone] Fax [phone] Note: This document was prepared with digital dictation and possible smart phrase technology. Any transcriptional errors that result from this process are unintentional.

## 2019-08-08 ENCOUNTER — Ambulatory Visit: Payer: Medicare HMO | Admitting: Adult Health

## 2019-08-08 NOTE — Progress Notes (Signed)
I agree with the above plan 

## 2019-08-29 ENCOUNTER — Telehealth: Payer: Self-pay | Admitting: Adult Health

## 2019-08-29 ENCOUNTER — Ambulatory Visit
Admission: RE | Admit: 2019-08-29 | Discharge: 2019-08-29 | Disposition: A | Discharge status: Home / Self Care | Payer: Medicare HMO | Source: Ambulatory Visit | Attending: Adult Health | Admitting: Adult Health

## 2019-08-29 DIAGNOSIS — I611 Nontraumatic intracerebral hemorrhage in hemisphere, cortical: Secondary | ICD-10-CM | POA: Diagnosis not present

## 2019-08-29 NOTE — Telephone Encounter (Signed)
She was seen on 7/19 for hospital follow-up which was fully discussed with her and her daughter.  I am not sure what she means by padding in the back of her head.  May be beneficial to reach out to her daughter as she does have cognitive impairment

## 2019-08-29 NOTE — Telephone Encounter (Signed)
Pt has called to report that it didn't dawn on her to ask during her appointment with Jessica,NP but she has questions re: the findings from the hospital re: her most recent stroke.  Pt states when she was in the hospital she never spoke with a Dr and the padding in the back of her head was never mentioned.  Pt states she was not even aware that she had padding in the back of her head.  Pt would like a call from RN re: anything she can tell her about what was determined from her stay in the hospital re: her stroke.

## 2019-08-29 NOTE — Telephone Encounter (Signed)
I called daughter. She had MRI completed today that JM,NP ordered at last visit. Pt concerned because techs told her "there was padding". I advised we were not sure what she is referencing on this. Once MD reviews MRI we will be sure to call her mother as requested about results. She will let her mother know. Call [phone] with results. Nothing further needed.

## 2019-08-30 ENCOUNTER — Telehealth: Payer: Self-pay | Admitting: Adult Health

## 2019-08-30 ENCOUNTER — Other Ambulatory Visit: Payer: Self-pay | Admitting: Adult Health

## 2019-08-30 MED ORDER — ASPIRIN EC 81 MG PO TBEC
81.0000 mg | DELAYED_RELEASE_TABLET | Freq: Every day | ORAL | 11 refills | Status: DC
Start: 2019-08-30 — End: 2022-01-06

## 2019-08-30 NOTE — Telephone Encounter (Signed)
Sandra Rider, NP  08/30/2019 7:23 AM EDT     Please advise patient/daughter that recent MRI showed resolving ICH and recommend initiating aspirin 81 mg daily for secondary stroke prevention   Left message for patient's daughter [name] to call back for MRI results.

## 2019-08-30 NOTE — Telephone Encounter (Signed)
Voicemail, 3:17p:  Patient's daughter is returning call.

## 2019-09-05 ENCOUNTER — Telehealth: Payer: Self-pay | Admitting: Physical Medicine & Rehabilitation

## 2019-09-05 NOTE — Telephone Encounter (Signed)
Patient would like a call about a beside commode that she has been charged for by [employer].  Patient states she did not take this equipment when she left hospital.  I told patient I would have the social worker contact her about this.

## 2019-09-07 ENCOUNTER — Ambulatory Visit: Payer: Medicare HMO | Admitting: Adult Health

## 2019-09-11 ENCOUNTER — Ambulatory Visit: Payer: Medicare HMO | Admitting: Adult Health

## 2019-09-12 ENCOUNTER — Other Ambulatory Visit: Payer: Medicare HMO

## 2019-11-20 ENCOUNTER — Ambulatory Visit: Payer: Medicare HMO | Admitting: Adult Health

## 2019-11-20 ENCOUNTER — Other Ambulatory Visit: Payer: Self-pay

## 2019-11-20 ENCOUNTER — Encounter: Payer: Self-pay | Admitting: Adult Health

## 2019-11-20 VITALS — BP 160/67 | HR 59 | Ht 59.0 in | Wt 128.0 lb

## 2019-11-20 DIAGNOSIS — E1165 Type 2 diabetes mellitus with hyperglycemia: Secondary | ICD-10-CM

## 2019-11-20 DIAGNOSIS — E785 Hyperlipidemia, unspecified: Secondary | ICD-10-CM

## 2019-11-20 DIAGNOSIS — I611 Nontraumatic intracerebral hemorrhage in hemisphere, cortical: Secondary | ICD-10-CM

## 2019-11-20 DIAGNOSIS — I10 Essential (primary) hypertension: Secondary | ICD-10-CM | POA: Diagnosis not present

## 2019-11-20 DIAGNOSIS — R4189 Other symptoms and signs involving cognitive functions and awareness: Secondary | ICD-10-CM | POA: Diagnosis not present

## 2019-11-20 MED ORDER — TOPIRAMATE 25 MG PO TABS: 25.0000 mg | ORAL_TABLET | Freq: Every evening | ORAL | 0 refills | Status: DC

## 2019-11-20 NOTE — Progress Notes (Signed)
[facility] [address] [place]. [place] [address] [phone]       STROKE FOLLOW UP NOTE  [name] Date of Birth:  Jan 05, 1934 Medical Record Number:  [id number]   Reason for Referral: stroke follow up    SUBJECTIVE:   CHIEF COMPLAINT:  Chief Complaint  Patient presents with  . Transient Ischemic Attack  . Room 9    HPI:    Today, 11/20/2019, [name] returns for stroke follow-up accompanied by her daughter.  Stable since prior visit with residual mild cognitive impairment and visual impairment which has been gradually improving.  Recent follow-up with ophthalmology with daughter reporting improvement of her vision on exam and plans on follow-up visit in January.  Denies new stroke/TIA symptoms.  Repeat MRI 08/29/2019 showed resolving ICH therefore initiated aspirin 81 mg daily for secondary stroke prevention.  She remains on aspirin 81 mg daily and atorvastatin 40 mg daily without side effects.  Blood pressure today initially 160/76 and not recheck 150/82.  Blood pressure not routinely monitored at home.  Glucose levels stable at home per daughter. remains on topiramate 25 mg twice daily for headache prophylaxis tolerating well without recent headache occurrence.  Continues to follow with PCP for depression and anxiety and PTSD recently started on mirtazapine but difficulty tolerating and referred to behavioral health.  Majority of today's visit was in regards to her experience during hospitalization while in the ICU.  She is fearful that the nurse will come to her home and speaks of exploring the options.  She is also adamant multiple times throughout the visit that she will never return to [facility].  Per patient and daughter, PCP is currently investigating this further.  No further concerns at this time.     History provided for reference purposes only Initial visit 08/07/2019 JM: [name] is being seen for hospital follow-up accompanied by  her daughter.  Discharged home from [place] on 07/05/2019.  Residual deficits of worsened cognitive impairment from baseline which has been slowly improving.  She does question being told of visual impairment during admission -denies peripheral visual loss but does report worsening vision reading up close.  Has ophthalmology appointment scheduled in the near future.  Denies participation in home health or outpatient therapy since CIR discharge.  Currently living with daughter.  Denies new or worsening stroke/TIA symptoms.  Blood pressure today 152/68 -routinely monitors at home and typically stable.  Topamax initiated during CIR for vascular headaches which have continued to be stable.  Tolerating Topamax without side effects.  PCP recently recommended restarting atorvastatin but she is hesitant due to recent weight.  Continues Klonopin at night but does not routinely take Seroquel.  No behavioral related concerns.  Recently had follow-up with PCP with adjustment to diabetic regimen.  No further concerns at this time.  Stroke admission 06/18/2019 [name] a 86 y.o.femalewith history of HTN, MI, CKD3, DM2, depression, previous strokes and ICH with mild cognitive decline over last 1-7yr (mRS 1) presented on 06/18/2019 with headache. CT with R occipital ICH.  Stroke work-up revealed right occipital ICH in setting of mild cognitive decline, etiology possibly hypertensive versus amyloid angiopathy.  MRA unremarkable no evidence of aneurysm or AVM.  Previously on aspirin 81 mg daily and recommended holding dosage due to hemorrhage.  History of HTN discharged on metoprolol 50 mg and increase lisinopril to 20 mg daily.  LDL 76 and recommended holding statin in setting of hemorrhage.  Patient enrolled in [place] trial through [facility].  Uncontrolled DM with A1c 8.6.  Underlying cognitive impairment with use of clonazepam and Seroquel nightly.  Prior stroke history 11/2010 left parietal  convexity SAH, 03/2018 right occipital infarct and 02/2019 TIA vs seizure vs complicated migraine.  Other stroke risk factors include advanced age, EtOH use, family history of stroke and takotsubo cardiomyopathy.  Evaluated by therapy and recommended discharge to CIR for ongoing therapy needs.  Stroke: R occipital ICH in setting of mild cognitive decline - could be hypertensive vs. amyloid angiopathy  CT head right temporoparietal hemorrhage.B Small vessel disease. Sinus dz  MRI R posterior temporoparietal lobe ICH with SDH, no shift, mild L frontal lobe and L parietal lobe chronic microhemorrhages  MRA head unremarkable, no aneurysm or AVM  2D EchoEF 60-65%. No source of embolus  Carotid Doppler unremarkable  LDL76  HgbA1c8.6  SCDs for VTE prophylaxis  aspirin 81 mg dailyprior to admission, now on No antithromboticgiven hemorrhage   Therapy recommendations: CIR  Disposition: CIR     ROS:   14 system review of systems performed and negative with exception of memory loss, confusion, decreased visual acuity  PMH:  Past Medical History:  Diagnosis Date  . Abnormal blood finding 02/10/2016  . Abrasion of ear canal 09/28/2012  . Achilles tendon injury 11/27/2010  . ACID REFLUX DISEASE 05/12/2006   Qualifier: Diagnosis of  By: Larose Kells MD, [place] Acute MI ([place])   . ALLERGIC RHINITIS 05/12/2006   Qualifier: Diagnosis of  By: Larose Kells MD, [place] Anxiety state 07/03/2009   Qualifier: Diagnosis of  By: Larose Kells MD, [place] pain 07/01/2010  . Chronic kidney disease (CKD), stage III (moderate) ([place]) 05/31/2014  . Chronic right SI joint pain 09/22/2013  . DEGENERATIVE JOINT DISEASE, CERVICAL SPINE 06/23/2006   Annotation: had a CAT scan with a cervical myelogram that show  left C6-7 and  C5-6 foraminal stenosis Qualifier: Diagnosis of  By: Larose Kells MD, [place] DEPRESSION 05/12/2006   Qualifier: Diagnosis of  By: Larose Kells MD, [place] DIABETES MELLITUS, TYPE II 05/12/2006   Now  following w/ Endo at [facility] 04/27/2007   Qualifier: Diagnosis of  By: Jerold Coombe    . Dyspnea 02/10/2016  . Encephalopathy, hypertensive   . Essential hypertension 05/12/2006   Qualifier: Diagnosis of  By: Larose Kells MD, [place] Fatigue 05/31/2014  . GAIT DISTURBANCE 08/07/2009   Qualifier: Diagnosis of  By: Larose Kells MD, [place] GERD (gastroesophageal reflux disease)   . Headache(784.0) 11/27/2010  . Hyperlipidemia   . Hyperlipidemia associated with type 2 diabetes mellitus ([place]) 05/12/2006   Qualifier: Diagnosis of  By: Larose Kells MD, [place] ILD (interstitial lung disease) ([place]) 05/31/2015  . Leukocytopenia 05/31/2014  . Lower abdominal pain 05/17/2014  . Nausea with vomiting 09/22/2013  . NECK PAIN, CHRONIC 04/03/2008   Qualifier: Diagnosis of  By: Larose Kells MD, [place] Nodule on liver 12/06/2015  . Osteoarthritis 02/10/2016  . Osteopenia   . Osteoporosis 06/23/2006   Annotation: had a bone density test in 08-2004.  T score was -2.4 Qualifier: Diagnosis of  By: Larose Kells MD, [place] SAH (subarachnoid hemorrhage) ([place])   . Sciatica   . Scleroderma ([place]) 02/28/2016  . Severe hypertension 03/18/2018  . Slurred speech 11/27/2010  . Takotsubo cardiomyopathy  s/p stress MI with stress induced CM with normal coronary arteries by cath 2007 with normalization of LVF by echo 04/2005  . TIA (transient ischemic attack)   . Unspecified cerebral artery occlusion with cerebral infarction 04/07/2012  . UTI (urinary tract infection) 11/02/2011  . 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 Vitamin D deficiency 10/01/2014    PSH:  Past Surgical History:  Procedure Laterality Date  . ABDOMINAL HYSTERECTOMY  1977   no oophorectomy  . APPENDECTOMY    . CARDIAC CATHETERIZATION    . CATARACT EXTRACTION, BILATERAL  2011  . SPINAL FUSION  2000   Dr. Vertell Limber  . TONSILLECTOMY      Social History:  Social History   Socioeconomic History  . Marital status: Divorced    Spouse name: Not on file  .  Number of children: 2  . Years of education: Not on file  . Highest education level: Not on file  Occupational History  . Occupation: Retired    Comment: Press photographer  Tobacco Use  . Smoking status: Passive Smoke Exposure - Never Smoker  . Smokeless tobacco: Never Used  . Tobacco comment: Mother & Children  Vaping Use  . Vaping Use: Never used  Substance and Sexual Activity  . Alcohol use: Yes    Alcohol/week: 1.0 standard drink    Types: 1 Glasses of wine per week    Comment: occ  . Drug use: No  . Sexual activity: Never  Other Topics Concern  . Not on file  Social History Narrative   Lives with her daughter and G-daughter   Diet- working on portion control   Exercise- no routine exercise but active      [facility]:   Originally from [place]. Always lived in [place]. Previously has traveled to [place], [place], [place], [place], & [place]. Previously did accounting and clerical work. Has a dog currently. Remote exposure to a parakeet. No mold exposure.    Social Determinants of Health   Financial Resource Strain:   . Difficulty of Paying Living Expenses: Not on file  Food Insecurity:   . Worried About Charity fundraiser in the Last Year: Not on file  . Ran Out of Food in the Last Year: Not on file  Transportation Needs:   . Lack of Transportation (Medical): Not on file  . Lack of Transportation (Non-Medical): Not on file  Physical Activity:   . Days of Exercise per Week: Not on file  . Minutes of Exercise per Session: Not on file  Stress:   . Feeling of Stress : Not on file  Social Connections:   . Frequency of Communication with Friends and Family: Not on file  . Frequency of Social Gatherings with Friends and Family: Not on file  . Attends Religious Services: Not on file  . Active Member of Clubs or Organizations: Not on file  . Attends Archivist Meetings: Not on file  . Marital Status: Not on file  Intimate Partner Violence:   . Fear of Current or Ex-Partner: Not  on file  . Emotionally Abused: Not on file  . Physically Abused: Not on file  . Sexually Abused: Not on file    Family History:  Family History  Problem Relation Age of Onset  . Intracerebral hemorrhage Daughter        assoc with post-partum  . Stroke Mother   . Diabetes Sister   . Asthma Sister   . Diabetes Sister   . Breast cancer Other        ?  Aunt  . Cancer Other        breast?  . Coronary artery disease Neg Hx     Medications:   Current Outpatient Medications on File Prior to Visit  Medication Sig Dispense Refill  . acetaminophen (TYLENOL) 325 MG tablet Take 1-2 tablets (325-650 mg total) by mouth every 4 (four) hours as needed for mild pain.    Marland Kitchen aspirin EC 81 MG tablet Take 1 tablet (81 mg total) by mouth daily. Swallow whole. 30 tablet 11  . atorvastatin (LIPITOR) 40 MG tablet Take 1 tablet (40 mg total) by mouth daily. 30 tablet 0  . clonazePAM (KLONOPIN) 0.5 MG disintegrating tablet Take 1 tablet (0.5 mg total) by mouth at bedtime. 60 tablet 0  . famotidine (PEPCID) 40 MG tablet Take 0.5 tablets (20 mg total) by mouth daily. 15 tablet 0  . glimepiride (AMARYL) 2 MG tablet Take 1 tablet (2 mg total) by mouth daily with breakfast. 30 tablet 0  . insulin detemir (LEVEMIR) 100 UNIT/ML injection Inject 0.07 mLs (7 Units total) into the skin 2 (two) times daily. (Patient taking differently: Inject 7 Units into the skin 2 (two) times daily. Now taking 8 units) 10 mL 0  . lisinopril (ZESTRIL) 40 MG tablet Take 1 tablet (40 mg total) by mouth daily. 30 tablet 0  . metoprolol succinate (TOPROL-XL) 50 MG 24 hr tablet Take 1 tablet (50 mg total) by mouth daily. Take with or immediately following a meal. 30 tablet 0  . pantoprazole (PROTONIX) 40 MG tablet Take 1 tablet (40 mg total) by mouth daily. 30 tablet 0  . vitamin B-12 (CYANOCOBALAMIN) 500 MCG tablet Take 1 tablet (500 mcg total) by mouth daily. 30 tablet 0   No current facility-administered medications on file prior to  visit.    Allergies:   Allergies  Allergen Reactions  . Cephalexin Nausea And Vomiting    Pt stated made severely sick, will never take again  . Nintedanib Diarrhea  . Pioglitazone Other (See Comments)    REACTION: EDEMA      OBJECTIVE:  Physical Exam  Vitals:   11/20/19 0941  BP: (!) 160/67  Pulse: (!) 59  Weight: 128 lb (58.1 kg)  Height: 4\' 11"  (1.499 m)   Body mass index is 25.85 kg/m. No exam data present  General: well developed, well nourished,  pleasant elderly Caucasian female, seated, anxious and tearful throughout visit Head: head normocephalic and atraumatic.   Neck: supple with no carotid or supraclavicular bruits Cardiovascular: regular rate and rhythm, no murmurs Musculoskeletal: no deformity Skin:  no rash/petichiae Vascular:  Normal pulses all extremities   Neurologic Exam Mental Status: Awake and fully alert.   Fluent speech and language.  Oriented to place and time. Recent and remote memory impaired. Attention span, concentration and fund of knowledge impaired.  Mood and affect anxious and tearful throughout visit consistently speaking of events that occurred while she was hospitalized.  Cranial Nerves: Pupils equal, briskly reactive to light. Extraocular movements full without nystagmus. Visual fields full to confrontation (unable to appreciate hemianopia). Hearing intact. Facial sensation intact. Face, tongue, palate moves normally and symmetrically.  Motor: Normal bulk and tone. Normal strength in all tested extremity muscles. Sensory.: intact to touch , pinprick , position and vibratory sensation.  Coordination: Rapid alternating movements normal in all extremities. Finger-to-nose and heel-to-shin performed accurately bilaterally. Gait and Station: Arises from chair without difficulty. Stance is normal. Gait demonstrates normal stride length and balance Reflexes: 1+ and symmetric. Toes  downgoing.       ASSESSMENT/PLAN: Sandra [name] is a 86  y.o. year old female presented with headache on 06/18/2019 with evidence of right occipital ICH possibly hypertensive versus amyloid angiopathy. Vascular risk factors include HTN, HLD, DM, prior strokes and ICH, cognitive decline and depression/anxiety.     1. R occipital ICH:  a. Residual deficit: Cognitive impairment and possibly mild visual impairment (although unable to appreciate on exam).  Difficulty fully assessing cognition as she was greatly anxious throughout visit consistently speaking of events that she believes took place while she was in the ICU during prior admission.  Encouraged her and daughter to reach out to patient relations at [facility] to further discuss their concerns but apparently her PCP is currently looking into this further.  Events that she continuously speaks of is in regards to having a catheter placed, not being allowed to leave her bed and when she would try an alarm would go off and everyone started rushing towards her, waking up with the nurse standing over and being given medications that she did not need or did not know what they were for. Advised I will further reach out to Dr. Erlinda Hong who saw her during hospitalization to see if he can further assist.  Difficulty fully determining accuracy of her perceived memories during hospitalization especially as after prior visit, patient called office 3 weeks later stating that she did not see a doctor while she was in the hospital and we did not discuss her stroke or hospitalization at recent visit.  She was seen by numerous doctors while hospitalized as well as prolonged discussion regarding her stroke and hospitalization at prior visit with her daughter present. highly encouraged being seen by behavioral health but due to high co-pay cost, they are unable to undergo evaluation.  Advised to further follow-up with PCP in regards to further treatment options of anxiety and depression b. Repeat MRI showed resolving ICH as well as  several scattered chronic microhemorrhages possibly hypertensive vs early cerebral amyloid angiopathy.  This was further discussed with patient and daughter. c. On Topamax for poststroke headache.  She has not experienced any additional headaches and recommend decreasing dosage to 25 mg nightly and if remains stable, can completely discontinue after 1 month d. Continue aspirin 81 mg daily and atorvastatin 40 mg daily for secondary stroke prevention e. Discussed secondary stroke prevention measures and importance of close PCP follow-up for aggressive stroke risk factor management of HTN, HLD and DM 2. HTN: BP goal<130/90.  Uncontrolled and elevated today.  On metoprolol and lisinopril per PCP.  Daughter and patient question metoprolol and lisinopril and if these are good medications to be on.  They were advised to further speak with PCP regarding blood pressure management.  Also advised to monitor blood pressure routinely at home 3. HLD: LDL goal<70.  Recent LDL 76.  On atorvastatin 40 mg daily per PCP  4. DMII: A1c goal<7.0.  Stable per daughter.  On Levemir and glimepiride per PCP    Follow up in 6 months or call earlier if needed   CC:  [facility] provider: Dr. Anna Genre, Earnest Conroy, MD    I spent a prolonged 45 minutes of face-to-face and non-face-to-face time with patient and daughter.  This included previsit chart review, lab review, study review, order entry, electronic health record documentation, patient education and discussion regarding prior strokes, residual deficits, importance of managing stroke risk factors and answered all questions to patient satisfaction with prolonged timeframe of speaking  about patient's traumatic experience during her hospitalization   Frann Rider, [facility]  [facility] [address] [place], [place] [address]  Phone [phone] Fax [phone] Note: This document was prepared with digital dictation and possible smart  phrase technology. Any transcriptional errors that result from this process are unintentional.
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
## 2019-11-21 ENCOUNTER — Encounter: Payer: Self-pay | Admitting: Adult Health

## 2019-11-21 NOTE — Progress Notes (Signed)
I agree with the above plan 
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
## 2020-03-20 ENCOUNTER — Encounter: Payer: Self-pay | Admitting: Neurology

## 2020-03-20 DIAGNOSIS — I611 Nontraumatic intracerebral hemorrhage in hemisphere, cortical: Secondary | ICD-10-CM
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
## 2020-03-23 IMAGING — MR MR HEAD W/O CM
12 of 13 series · 43 of 48 positions shown · non-contrast
Comparison: CT head 03/17/2018

CLINICAL DATA: Focal neuro deficit less than 6 hours. Diabetes
hypertension chronic kidney disease. Slurred speech headache.
Possible left-sided weakness and right facial droop.

EXAM:
MRI HEAD WITHOUT CONTRAST
TECHNIQUE: Multiplanar, multiecho pulse sequences of the brain and surrounding
structures were obtained without intravenous contrast.

[Series 5: DWI · axial · 4.0mm · 0.88mm/px · z∈[-57,+79]mm · 5 of 70 slices shown (1 of 4)]
[im 1/70]
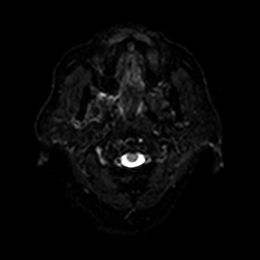
[im 18/70]
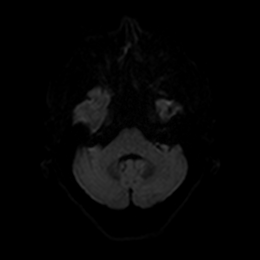
[im 35/70]
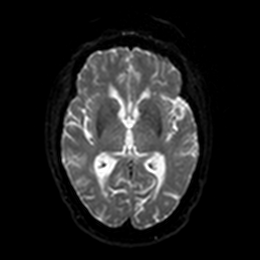
[im 52/70]
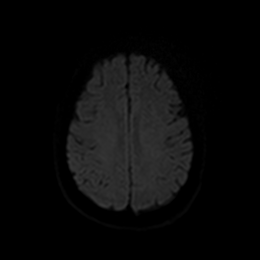
[im 70/70]
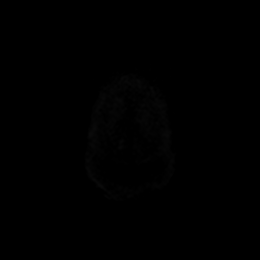

[Series 6: DWI · axial · 4.0mm · 0.88mm/px · z∈[-57,+79]mm · 3 of 35 slices shown (2 of 4)]
[im 1/35]
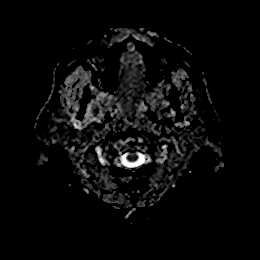
[im 18/35]
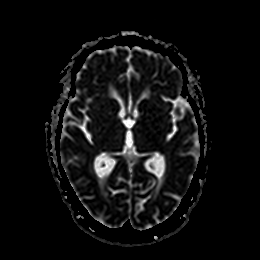
[im 35/35]
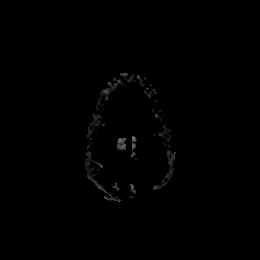

[Series 7: DWI · coronal · 4.0mm · 0.88mm/px · 5 of 68 slices shown (3 of 4)]
[im 1/68]
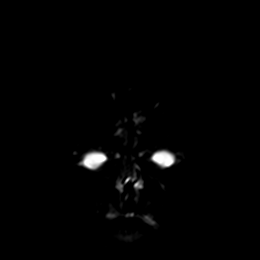
[im 17/68]
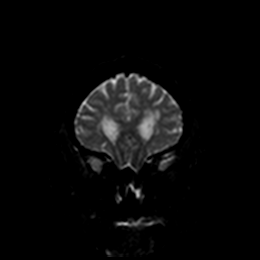
[im 34/68]
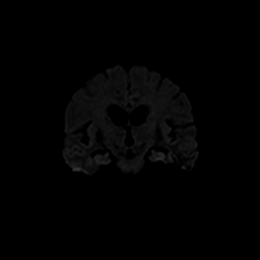
[im 51/68]
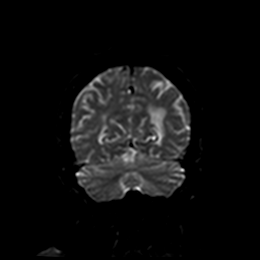
[im 68/68]
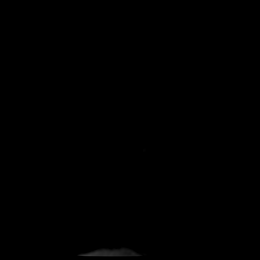

[Series 8: DWI · coronal · 4.0mm · 0.88mm/px · 3 of 34 slices shown (4 of 4)]
[im 1/34]
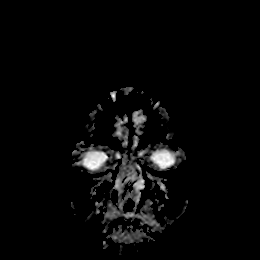
[im 17/34]
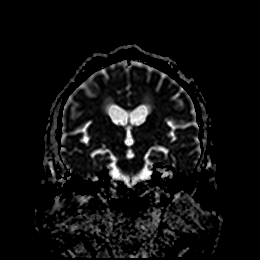
[im 34/34]
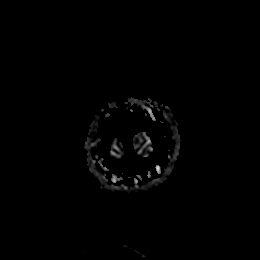

[Series 9: T1 · sagittal · 5.0mm · 0.75mm/px · 2 of 23 slices shown]
[im 1/23]
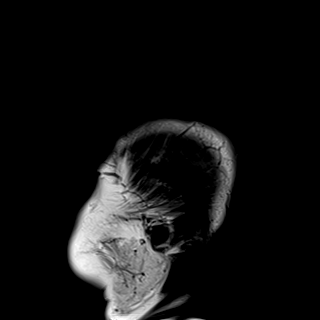
[im 23/23]
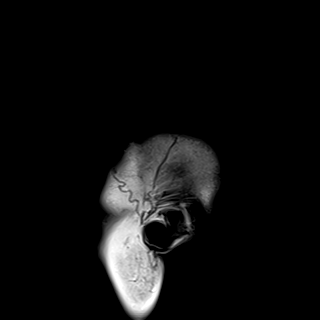

[Series 10: T2 · axial · 5.0mm · 0.72mm/px · z∈[-64,+80]mm · 2 of 25 slices shown (1 of 2)]
[im 1/25]
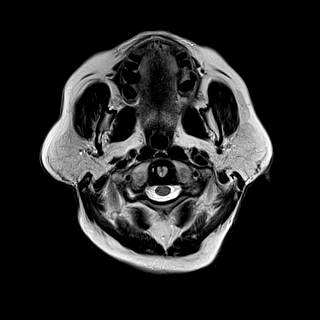
[im 25/25]
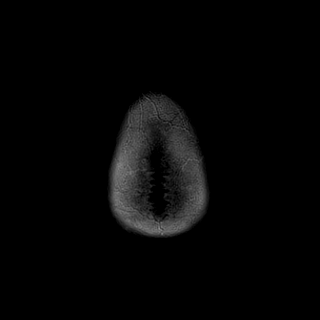

[Series 11: FLAIR · axial · 5.0mm · 0.45mm/px · z∈[-63,+81]mm · 2 of 25 slices shown]
[im 1/25]
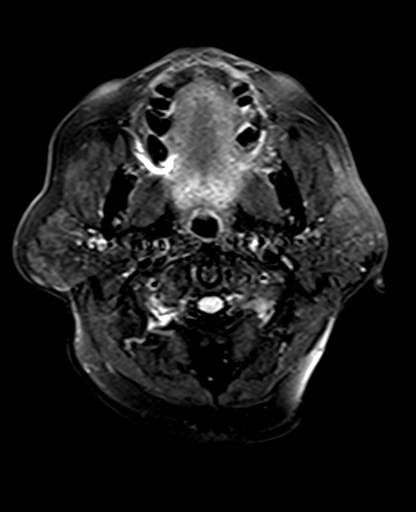
[im 25/25]
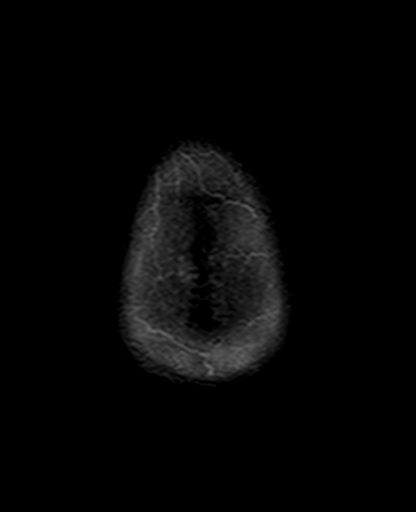

[Series 12: mag_images · axial · 3.0mm · 0.90mm/px · z∈[-71,+106]mm · 5 of 60 slices shown]
[im 1/60]
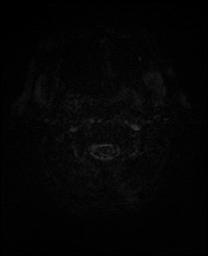
[im 15/60]
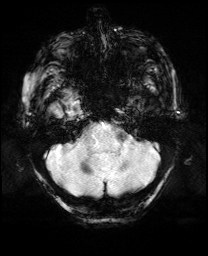
[im 30/60]
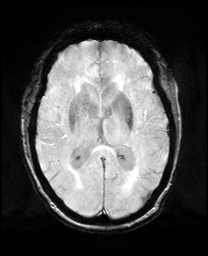
[im 45/60]
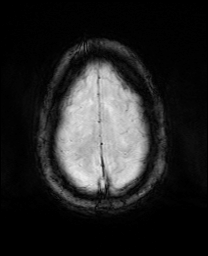
[im 60/60]
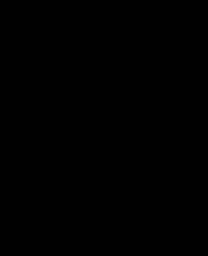

[Series 13: pha_images · axial · 3.0mm · 0.90mm/px · z∈[-71,+106]mm · 5 of 59 slices shown]
[im 1/59]
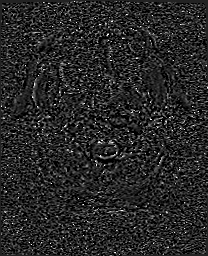
[im 15/59]
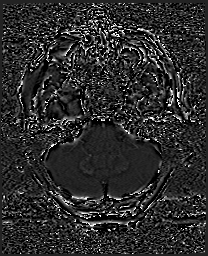
[im 30/59]
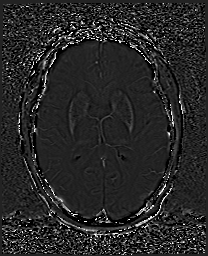
[im 44/59]
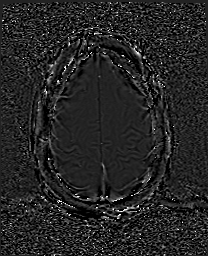
[im 59/59]
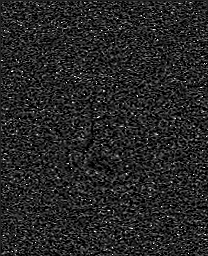

[Series 14: swi_images · axial · 3.0mm · 0.90mm/px · z∈[-71,+106]mm · 5 of 60 slices shown]
[im 1/60]
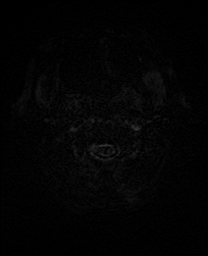
[im 15/60]
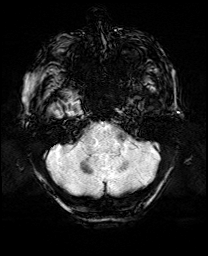
[im 30/60]
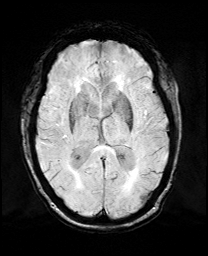
[im 45/60]
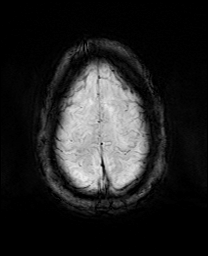
[im 60/60]
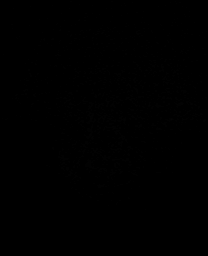

[Series 15: mip_images(sw) · axial · 24.0mm · 0.90mm/px · z∈[-60,+95]mm · 4 of 53 slices shown]
[im 1/53]
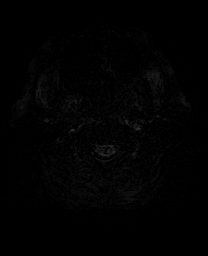
[im 18/53]
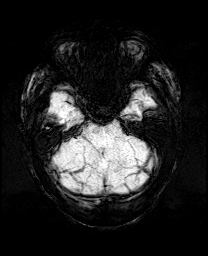
[im 35/53]
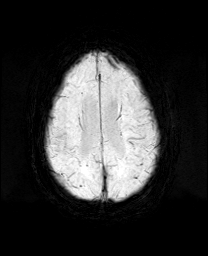
[im 53/53]
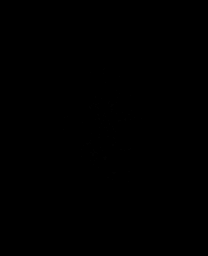

[Series 17: T2 · coronal · 5.0mm · 0.34mm/px · 2 of 29 slices shown (2 of 2)]
[im 1/29]
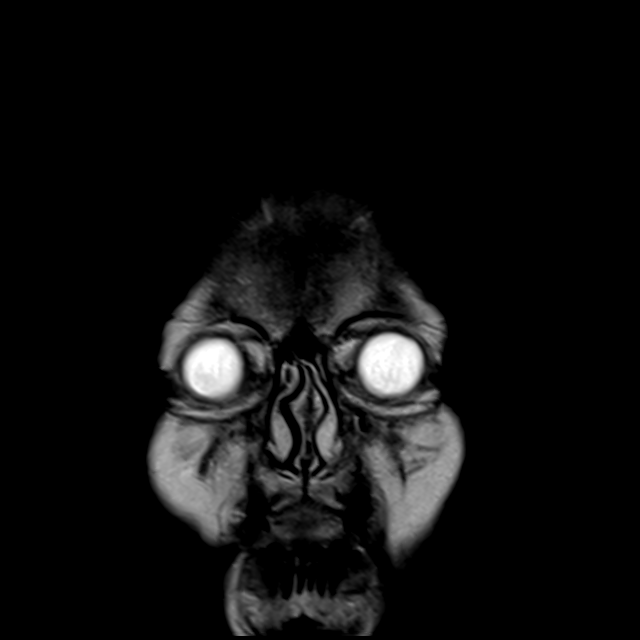
[im 29/29]
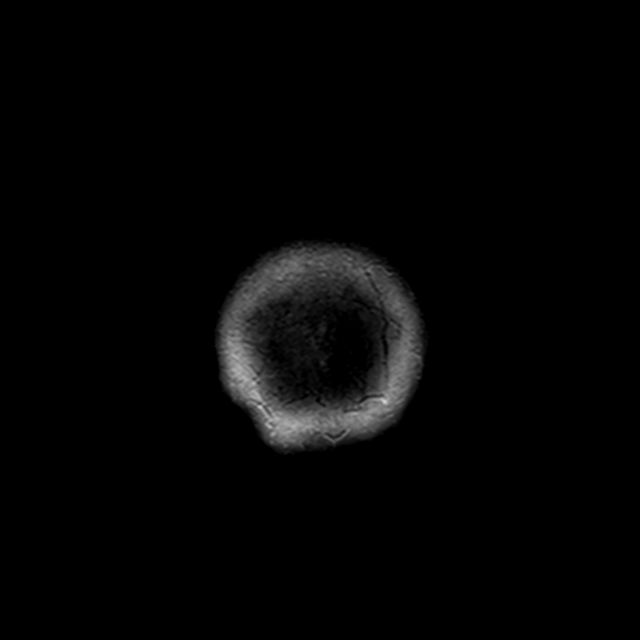

[43 of 48 positions shown; findings below may reference images not displayed]

FINDINGS: Brain: Possible tiny acute infarct in the right occipital lobe. This
is cortically based and could be an artifact however is seen on
coronal and axial diffusion. No other acute infarct.

Mild to moderate chronic white matter changes. No cortical infarct.
Negative for hemorrhage or mass. Ventricle size and cerebral volume
normal for age.

Vascular: Normal arterial flow voids

Skull and upper cervical spine: Negative

Sinuses/Orbits: Mild mucosal edema paranasal sinuses. Bilateral
cataract surgery.

Other: None
IMPRESSION: 1. Possible tiny acute infarct in the right occipital cortex. Given
the location this could be an artifact however is seen on axial and
coronal diffusion imaging suggesting this may be an acute infarct.
2. Moderate chronic microvascular ischemic change in the white
matter.

## 2020-05-20 ENCOUNTER — Ambulatory Visit: Payer: Medicare HMO | Admitting: Adult Health

## 2020-05-20 ENCOUNTER — Encounter: Payer: Self-pay | Admitting: Adult Health

## 2020-05-20 NOTE — Progress Notes (Deleted)
[facility] [address] [name]. Sandra [name] [address] [phone]       STROKE FOLLOW UP NOTE  Sandra [name] Date of Birth:  [birth year] Medical Record Number:  [id number]   Reason for Referral: stroke follow up    SUBJECTIVE:   CHIEF COMPLAINT:  No chief complaint on file.   HPI:   Today, 05/20/2020, Sandra [name] returns for 67-month stroke follow-up accompanied by her daughter  Stable since prior visit without new stroke/TIA symptoms Residual cognitive and visual impairment stable  Reports compliance on aspirin and atorvastatin without associated side effects Blood pressure today *** Continues to follow closely with PCP for uncontrolled DM with recent insulin adjustments         History provided for reference purposes only Update 11/20/2019 JM: Sandra [name] returns for stroke follow-up accompanied by her daughter.  Stable since prior visit with residual mild cognitive impairment and visual impairment which has been gradually improving.  Recent follow-up with ophthalmology with daughter reporting improvement of her vision on exam and plans on follow-up visit in January.  Denies new stroke/TIA symptoms.  Repeat MRI 08/29/2019 showed resolving ICH therefore initiated aspirin 81 mg daily for secondary stroke prevention.  She remains on aspirin 81 mg daily and atorvastatin 40 mg daily without side effects.  Blood pressure today initially 160/76 and not recheck 150/82.  Blood pressure not routinely monitored at home.  Glucose levels stable at home per daughter. remains on topiramate 25 mg twice daily for headache prophylaxis tolerating well without recent headache occurrence.  Continues to follow with PCP for depression and anxiety and PTSD recently started on mirtazapine but difficulty tolerating and referred to behavioral health.  Majority of today's visit was in regards to her experience during hospitalization while in the ICU.  She is fearful that the nurse will  come to her home and speaks of exploring the options.  She is also adamant multiple times throughout the visit that she will never return to [facility].  Per patient and daughter, PCP is currently investigating this further.  No further concerns at this time.  Initial visit 08/07/2019 JM: Sandra [name] is being seen for hospital follow-up accompanied by her daughter.  Discharged home from [facility] on 07/05/2019.  Residual deficits of worsened cognitive impairment from baseline which has been slowly improving.  She does question being told of visual impairment during admission -denies peripheral visual loss but does report worsening vision reading up close.  Has ophthalmology appointment scheduled in the near future.  Denies participation in home health or outpatient therapy since CIR discharge.  Currently living with daughter.  Denies new or worsening stroke/TIA symptoms.  Blood pressure today 152/68 -routinely monitors at home and typically stable.  Topamax initiated during CIR for vascular headaches which have continued to be stable.  Tolerating Topamax without side effects.  PCP recently recommended restarting atorvastatin but she is hesitant due to recent weight.  Continues Klonopin at night but does not routinely take Seroquel.  No behavioral related concerns.  Recently had follow-up with PCP with adjustment to diabetic regimen.  No further concerns at this time.  Stroke admission 06/18/2019 Sandra [name] a 87 y.o.femalewith history of HTN, MI, CKD3, DM2, depression, previous strokes and ICH with mild cognitive decline over last 1-56yr (mRS 1) presented on 06/18/2019 with headache. CT with R occipital ICH.  Stroke work-up revealed right occipital ICH in setting of mild cognitive decline, etiology possibly hypertensive versus amyloid angiopathy.  MRA unremarkable no evidence of aneurysm or  AVM.  Previously on aspirin 81 mg daily and recommended holding dosage due to hemorrhage.  History of HTN discharged on  metoprolol 50 mg and increase lisinopril to 20 mg daily.  LDL 76 and recommended holding statin in setting of hemorrhage.  Patient enrolled in SATURN trial through [facility].  Uncontrolled DM with A1c 8.6.  Underlying cognitive impairment with use of clonazepam and Seroquel nightly.  Prior stroke history 11/2010 left parietal convexity SAH, 03/2018 right occipital infarct and 02/2019 TIA vs seizure vs complicated migraine.  Other stroke risk factors include advanced age, EtOH use, family history of stroke and takotsubo cardiomyopathy.  Evaluated by therapy and recommended discharge to CIR for ongoing therapy needs.  Stroke: R occipital ICH in setting of mild cognitive decline - could be hypertensive vs. amyloid angiopathy  CT head right temporoparietal hemorrhage.B Small vessel disease. Sinus dz  MRI R posterior temporoparietal lobe ICH with SDH, no shift, mild L frontal lobe and L parietal lobe chronic microhemorrhages  MRA head unremarkable, no aneurysm or AVM  2D EchoEF 60-65%. No source of embolus  Carotid Doppler unremarkable  LDL76  HgbA1c8.6  SCDs for VTE prophylaxis  aspirin 81 mg dailyprior to admission, now on No antithromboticgiven hemorrhage   Therapy recommendations: CIR  Disposition: CIR     ROS:   14 system review of systems performed and negative with exception of memory loss, confusion, decreased visual acuity  PMH:  Past Medical History:  Diagnosis Date  . Abnormal blood finding 02/10/2016  . Abrasion of ear canal 09/28/2012  . Achilles tendon injury 11/27/2010  . ACID REFLUX DISEASE 05/12/2006   Qualifier: Diagnosis of  By: Larose Kells MD, [place] Acute MI ([place])   . ALLERGIC RHINITIS 05/12/2006   Qualifier: Diagnosis of  By: Larose Kells MD, [place] Anxiety state 07/03/2009   Qualifier: Diagnosis of  By: Larose Kells MD, [place] pain 07/01/2010  . Chronic kidney disease (CKD), stage III (moderate) ([place]) 05/31/2014  . Chronic  right SI joint pain 09/22/2013  . DEGENERATIVE JOINT DISEASE, CERVICAL SPINE 06/23/2006   Annotation: had a CAT scan with a cervical myelogram that show  left C6-7 and  C5-6 foraminal stenosis Qualifier: Diagnosis of  By: Larose Kells MD, [place] DEPRESSION 05/12/2006   Qualifier: Diagnosis of  By: Larose Kells MD, [place] DIABETES MELLITUS, TYPE II 05/12/2006   Now following w/ Endo at [place] 04/27/2007   Qualifier: Diagnosis of  By: Jerold Coombe    . Dyspnea 02/10/2016  . Encephalopathy, hypertensive   . Essential hypertension 05/12/2006   Qualifier: Diagnosis of  By: Larose Kells MD, [place] Fatigue 05/31/2014  . GAIT DISTURBANCE 08/07/2009   Qualifier: Diagnosis of  By: Larose Kells MD, [place] GERD (gastroesophageal reflux disease)   . Headache(784.0) 11/27/2010  . Hyperlipidemia   . Hyperlipidemia associated with type 2 diabetes mellitus ([place]) 05/12/2006   Qualifier: Diagnosis of  By: Larose Kells MD, [place] ILD (interstitial lung disease) ([place]) 05/31/2015  . Leukocytopenia 05/31/2014  . Lower abdominal pain 05/17/2014  . Nausea with vomiting 09/22/2013  . NECK PAIN, CHRONIC 04/03/2008   Qualifier: Diagnosis of  By: Larose Kells MD, [place] Nodule on liver 12/06/2015  . Osteoarthritis 02/10/2016  . Osteopenia   . Osteoporosis 06/23/2006   Annotation: had a bone density test in  08-2004.  T score was -2.4 Qualifier: Diagnosis of  By: Larose Kells MD, [place] SAH (subarachnoid hemorrhage) ([place])   . Sciatica   . Scleroderma ([place]) 02/28/2016  . Severe hypertension 03/18/2018  . Slurred speech 11/27/2010  . Takotsubo cardiomyopathy    s/p stress MI with stress induced CM with normal coronary arteries by cath 2007 with normalization of LVF by echo 04/2005  . TIA (transient ischemic attack)   . Unspecified cerebral artery occlusion with cerebral infarction 04/07/2012  . UTI (urinary tract infection) 11/02/2011  . 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 Vitamin D deficiency 10/01/2014    PSH:  Past Surgical  History:  Procedure Laterality Date  . ABDOMINAL HYSTERECTOMY  1977   no oophorectomy  . APPENDECTOMY    . CARDIAC CATHETERIZATION    . CATARACT EXTRACTION, BILATERAL  2011  . SPINAL FUSION  2000   Dr. Vertell Limber  . TONSILLECTOMY      Social History:  Social History   Socioeconomic History  . Marital status: Divorced    Spouse name: Not on file  . Number of children: 2  . Years of education: Not on file  . Highest education level: Not on file  Occupational History  . Occupation: Retired    Comment: Press photographer  Tobacco Use  . Smoking status: Passive Smoke Exposure - Never Smoker  . Smokeless tobacco: Never Used  . Tobacco comment: Mother & Children  Vaping Use  . Vaping Use: Never used  Substance and Sexual Activity  . Alcohol use: Yes    Alcohol/week: 1.0 standard drink    Types: 1 Glasses of wine per week    Comment: occ  . Drug use: No  . Sexual activity: Never  Other Topics Concern  . Not on file  Social History Narrative   Lives with her daughter and G-daughter   Diet- working on portion control   Exercise- no routine exercise but active      [facility]:   Originally from [place]. Always lived in [place]. Previously has traveled to [place], [place], [place], [place], & [place]. Previously did accounting and clerical work. Has a dog currently. Remote exposure to a parakeet. No mold exposure.    Social Determinants of Health   Financial Resource Strain: Not on file  Food Insecurity: Not on file  Transportation Needs: Not on file  Physical Activity: Not on file  Stress: Not on file  Social Connections: Not on file  Intimate Partner Violence: Not on file    Family History:  Family History  Problem Relation Age of Onset  . Intracerebral hemorrhage Daughter        assoc with post-partum  . Stroke Mother   . Diabetes Sister   . Asthma Sister   . Diabetes Sister   . Breast cancer Other        ?Aunt  . Cancer Other        breast?  . Coronary artery disease Neg Hx      Medications:   Current Outpatient Medications on File Prior to Visit  Medication Sig Dispense Refill  . acetaminophen (TYLENOL) 325 MG tablet Take 1-2 tablets (325-650 mg total) by mouth every 4 (four) hours as needed for mild pain.    Marland Kitchen aspirin EC 81 MG tablet Take 1 tablet (81 mg total) by mouth daily. Swallow whole. 30 tablet 11  . atorvastatin (LIPITOR) 40 MG tablet Take 1 tablet (40 mg total) by mouth daily. 30 tablet 0  .  clonazePAM (KLONOPIN) 0.5 MG disintegrating tablet Take 1 tablet (0.5 mg total) by mouth at bedtime. 60 tablet 0  . famotidine (PEPCID) 40 MG tablet Take 0.5 tablets (20 mg total) by mouth daily. 15 tablet 0  . glimepiride (AMARYL) 2 MG tablet Take 1 tablet (2 mg total) by mouth daily with breakfast. 30 tablet 0  . insulin detemir (LEVEMIR) 100 UNIT/ML injection Inject 0.07 mLs (7 Units total) into the skin 2 (two) times daily. (Patient taking differently: Inject 7 Units into the skin 2 (two) times daily. Now taking 8 units) 10 mL 0  . lisinopril (ZESTRIL) 40 MG tablet Take 1 tablet (40 mg total) by mouth daily. 30 tablet 0  . metoprolol succinate (TOPROL-XL) 50 MG 24 hr tablet Take 1 tablet (50 mg total) by mouth daily. Take with or immediately following a meal. 30 tablet 0  . pantoprazole (PROTONIX) 40 MG tablet Take 1 tablet (40 mg total) by mouth daily. 30 tablet 0  . topiramate (TOPAMAX) 25 MG tablet Take 1 tablet (25 mg total) by mouth at bedtime. 30 tablet 0  . vitamin B-12 (CYANOCOBALAMIN) 500 MCG tablet Take 1 tablet (500 mcg total) by mouth daily. 30 tablet 0   No current facility-administered medications on file prior to visit.    Allergies:   Allergies  Allergen Reactions  . Cephalexin Nausea And Vomiting    Pt stated made severely sick, will never take again  . Nintedanib Diarrhea  . Pioglitazone Other (See Comments)    REACTION: EDEMA      OBJECTIVE:  Physical Exam  There were no vitals filed for this visit. There is no height or weight  on file to calculate BMI. No exam data present  General: well developed, well nourished,  pleasant elderly Caucasian female, seated, anxious and tearful throughout visit Head: head normocephalic and atraumatic.   Neck: supple with no carotid or supraclavicular bruits Cardiovascular: regular rate and rhythm, no murmurs Musculoskeletal: no deformity Skin:  no rash/petichiae Vascular:  Normal pulses all extremities   Neurologic Exam Mental Status: Awake and fully alert.   Fluent speech and language.  Oriented to place and time. Recent and remote memory impaired. Attention span, concentration and fund of knowledge impaired.  Mood and affect anxious and tearful throughout visit consistently speaking of events that occurred while she was hospitalized.  Cranial Nerves: Pupils equal, briskly reactive to light. Extraocular movements full without nystagmus. Visual fields full to confrontation (unable to appreciate hemianopia). Hearing intact. Facial sensation intact. Face, tongue, palate moves normally and symmetrically.  Motor: Normal bulk and tone. Normal strength in all tested extremity muscles. Sensory.: intact to touch , pinprick , position and vibratory sensation.  Coordination: Rapid alternating movements normal in all extremities. Finger-to-nose and heel-to-shin performed accurately bilaterally. Gait and Station: Arises from chair without difficulty. Stance is normal. Gait demonstrates normal stride length and balance Reflexes: 1+ and symmetric. Toes downgoing.       ASSESSMENT/PLAN: Sandra [name] is a 87 y.o. year old female presented with headache on 06/18/2019 with evidence of right occipital ICH possibly hypertensive versus amyloid angiopathy. Vascular risk factors include HTN, HLD, DM, prior strokes and ICH, cognitive decline and depression/anxiety.     1. R occipital ICH:  a. Residual deficit: Cognitive impairment and possibly mild visual impairment (although unable to appreciate on  exam).  Difficulty fully assessing cognition as she was greatly anxious throughout visit consistently speaking of events that she believes took place while she was in the ICU during  prior admission.  Encouraged her and daughter to reach out to patient relations at [facility] to further discuss their concerns but apparently her PCP is currently looking into this further.  Events that she continuously speaks of is in regards to having a catheter placed, not being allowed to leave her bed and when she would try an alarm would go off and everyone started rushing towards her, waking up with the nurse standing over and being given medications that she did not need or did not know what they were for. Advised I will further reach out to Dr. Erlinda Hong who saw her during hospitalization to see if he can further assist.  Difficulty fully determining accuracy of her perceived memories during hospitalization especially as after prior visit, patient called office 3 weeks later stating that she did not see a doctor while she was in the hospital and we did not discuss her stroke or hospitalization at recent visit.  She was seen by numerous doctors while hospitalized as well as prolonged discussion regarding her stroke and hospitalization at prior visit with her daughter present. highly encouraged being seen by behavioral health but due to high co-pay cost, they are unable to undergo evaluation.  Advised to further follow-up with PCP in regards to further treatment options of anxiety and depression b. Repeat MRI showed resolving ICH as well as several scattered chronic microhemorrhages possibly hypertensive vs early cerebral amyloid angiopathy.  This was further discussed with patient and daughter. c. On Topamax for poststroke headache.  She has not experienced any additional headaches and recommend decreasing dosage to 25 mg nightly and if remains stable, can completely discontinue after 1 month d. Continue aspirin 81 mg daily and  atorvastatin 40 mg daily for secondary stroke prevention e. Discussed secondary stroke prevention measures and importance of close PCP follow-up for aggressive stroke risk factor management of HTN, HLD and DM 2. HTN: BP goal<130/90.  Uncontrolled and elevated today.  On metoprolol and lisinopril per PCP.  Daughter and patient question metoprolol and lisinopril and if these are good medications to be on.  They were advised to further speak with PCP regarding blood pressure management.  Also advised to monitor blood pressure routinely at home 3. HLD: LDL goal<70.  Recent LDL 76.  On atorvastatin 40 mg daily per PCP  4. DMII: A1c goal<7.0.  Stable per daughter.  On Levemir and glimepiride per PCP    Follow up in 6 months or call earlier if needed   CC:  [facility] provider: Dr. Anna Genre, Earnest Conroy, MD    I spent a prolonged 45 minutes of face-to-face and non-face-to-face time with patient and daughter.  This included previsit chart review, lab review, study review, order entry, electronic health record documentation, patient education and discussion regarding prior strokes, residual deficits, importance of managing stroke risk factors and answered all questions to patient satisfaction with prolonged timeframe of speaking about patient's traumatic experience during her hospitalization   Frann Rider, [facility]  [facility] [address] [place], [place] [address]  Phone [phone] Fax [phone] Note: This document was prepared with digital dictation and possible smart phrase technology. Any transcriptional errors that result from this process are unintentional.

## 2020-10-21 ENCOUNTER — Encounter: Payer: Self-pay | Admitting: Adult Health

## 2020-10-21 ENCOUNTER — Ambulatory Visit: Payer: Medicare HMO | Admitting: Adult Health

## 2020-10-21 ENCOUNTER — Other Ambulatory Visit: Payer: Self-pay

## 2020-10-21 VITALS — BP 140/78 | HR 78 | Ht 60.0 in | Wt 136.0 lb

## 2020-10-21 DIAGNOSIS — E785 Hyperlipidemia, unspecified: Secondary | ICD-10-CM

## 2020-10-21 DIAGNOSIS — I10 Essential (primary) hypertension: Secondary | ICD-10-CM

## 2020-10-21 DIAGNOSIS — E1165 Type 2 diabetes mellitus with hyperglycemia: Secondary | ICD-10-CM

## 2020-10-21 DIAGNOSIS — I611 Nontraumatic intracerebral hemorrhage in hemisphere, cortical: Secondary | ICD-10-CM

## 2020-10-21 NOTE — Patient Instructions (Addendum)
Continue aspirin 81 mg daily  and atorvastatin  for secondary stroke prevention  Continue to follow up with PCP regarding cholesterol, blood pressure and diabetes management  Maintain strict control of hypertension with blood pressure goal below 130/90, diabetes with hemoglobin A1c goal below 7% and cholesterol with LDL cholesterol (bad cholesterol) goal below 70 mg/dL.   Please ensure you discuss obtaining cholesterol levels with your PCP  Please continue to follow with your PCP for aggressive stroke risk factor management - any questions or concerns in the future please let me know       Thank you for coming to see [place] at [facility]. I hope we have been able to provide you high quality care today.  You may receive a patient satisfaction survey over the next few weeks. We would appreciate your feedback and comments so that we may continue to improve ourselves and the health of our patients.

## 2020-10-21 NOTE — Progress Notes (Signed)
[facility] [address] [place] [address] [phone]       STROKE FOLLOW UP NOTE  [name] Date of Birth:  03-19-1933 Medical Record Number:  [id number]   Reason for Referral: stroke follow up    SUBJECTIVE:   CHIEF COMPLAINT:  Chief Complaint  Patient presents with   Nontraumatic cortical hemorrhage    Rm 3, "no new concerns, have seen eye dr- eyes looked very good"     HPI:   Sandra [name] is a 87 y.o. female with pertinent PMHx of right occipital ICH possibly hypertensive vs amyloid angiopathy on 06/18/2019 with residual cognitive and visual impairment, HTN, HLD, DM, CKD and anxiety/depression/PTSD.   Today, 10/21/2020, returns for overdue stroke follow up after prior visit 11 months ago unaccompanied.  Overall stable from stroke standpoint.  Residual mild cognitive deficits stable without worsening. She is doing all prior activities without difficulty. She continue to live with her daughter maintaining all ADLs independently. She will occasionally drive short distance without difficulty.Has since tapered off from topiramate with occasional mild right sided headache which will resolve after use of Tylenol.  Denies any severe or prolonged headaches.  Denies new stroke/TIA symptoms.  Compliant on aspirin and atorvastatin without side effects.  Blood pressure today 140/78. Routinely monitors at home and has been stable.  Glucose levels more recently improving after increase of insulin dosage.  Recent A1c 11.3 down from 12.5.  No further concerns at this time.    History provided for reference purposes Update 11/20/2019 JM: Sandra [name] returns for stroke follow-up accompanied by her daughter.  Stable since prior visit with residual mild cognitive impairment and visual impairment which has been gradually improving.  Recent follow-up with ophthalmology with daughter reporting improvement of her vision on exam and plans on follow-up visit in  January.  Denies new stroke/TIA symptoms.  Repeat MRI 08/29/2019 showed resolving ICH therefore initiated aspirin 81 mg daily for secondary stroke prevention.  She remains on aspirin 81 mg daily and atorvastatin 40 mg daily without side effects.  Blood pressure today initially 160/76 and not recheck 150/82.  Blood pressure not routinely monitored at home.  Glucose levels stable at home per daughter. remains on topiramate 25 mg twice daily for headache prophylaxis tolerating well without recent headache occurrence.  Continues to follow with PCP for depression and anxiety and PTSD recently started on mirtazapine but difficulty tolerating and referred to behavioral health.  Majority of today's visit was in regards to her experience during hospitalization while in the ICU.  She is fearful that the nurse will come to her home and speaks of exploring the options.  She is also adamant multiple times throughout the visit that she will never return to [facility].  Per patient and daughter, PCP is currently investigating this further.  No further concerns at this time.  Initial visit 08/07/2019 JM: Sandra [name] is being seen for hospital follow-up accompanied by her daughter.  Discharged home from [facility] on 07/05/2019.  Residual deficits of worsened cognitive impairment from baseline which has been slowly improving.  She does question being told of visual impairment during admission -denies peripheral visual loss but does report worsening vision reading up close.  Has ophthalmology appointment scheduled in the near future.  Denies participation in home health or outpatient therapy since CIR discharge.  Currently living with daughter.  Denies new or worsening stroke/TIA symptoms.  Blood pressure today 152/68 -routinely monitors at home and typically stable.  Topamax initiated during  CIR for vascular headaches which have continued to be stable.  Tolerating Topamax without side effects.  PCP recently recommended restarting  atorvastatin but she is hesitant due to recent weight.  Continues Klonopin at night but does not routinely take Seroquel.  No behavioral related concerns.  Recently had follow-up with PCP with adjustment to diabetic regimen.  No further concerns at this time.  Stroke admission 06/18/2019 Sandra [name] is a 87 y.o. female with history of HTN, MI, CKD3, DM2, depression, previous strokes and ICH with mild cognitive decline over last 1-47yr (mRS 1) presented on 06/18/2019 with headache. CT with R occipital ICH.  Stroke work-up revealed right occipital ICH in setting of mild cognitive decline, etiology possibly hypertensive versus amyloid angiopathy.  MRA unremarkable no evidence of aneurysm or AVM.  Previously on aspirin 81 mg daily and recommended holding dosage due to hemorrhage.  History of HTN discharged on metoprolol 50 mg and increase lisinopril to 20 mg daily.  LDL 76 and recommended holding statin in setting of hemorrhage.  Patient enrolled in SATURN trial through [facility].  Uncontrolled DM with A1c 8.6.  Underlying cognitive impairment with use of clonazepam and Seroquel nightly.  Prior stroke history 11/2010 left parietal convexity SAH, 03/2018 right occipital infarct and 02/2019 TIA vs seizure vs complicated migraine.  Other stroke risk factors include advanced age, EtOH use, family history of stroke and takotsubo cardiomyopathy.  Evaluated by therapy and recommended discharge to CIR for ongoing therapy needs.  Stroke:   R occipital ICH in setting of mild cognitive decline - could be hypertensive vs. amyloid angiopathy CT head right temporoparietal hemorrhage. B Small vessel disease. Sinus dz MRI  R posterior temporoparietal lobe ICH with SDH, no shift, mild L frontal lobe and L parietal lobe chronic microhemorrhages  MRA head unremarkable, no aneurysm or AVM 2D Echo EF 60-65%. No source of embolus  Carotid Doppler unremarkable LDL 76 HgbA1c 8.6  SCDs for VTE  prophylaxis aspirin 81 mg daily prior to admission, now on No antithrombotic given hemorrhage  Therapy recommendations:  CIR Disposition:  CIR     ROS:   14 system review of systems performed and negative with exception of those listed in HPI  PMH:  Past Medical History:  Diagnosis Date   Abnormal blood finding 02/10/2016   Abrasion of ear canal 09/28/2012   Achilles tendon injury 11/27/2010   ACID REFLUX DISEASE 05/12/2006   Qualifier: Diagnosis of  By: Larose Kells MD, [place]    Acute MI [facility])    ALLERGIC RHINITIS 05/12/2006   Qualifier: Diagnosis of  By: Larose Kells MD, [place]    Anxiety state 07/03/2009   Qualifier: Diagnosis of  By: Larose Kells MD, [place] pain 07/01/2010   Chronic kidney disease (CKD), stage III (moderate) (HCC) 05/31/2014   Chronic right SI joint pain 09/22/2013   DEGENERATIVE JOINT DISEASE, CERVICAL SPINE 06/23/2006   Annotation: had a CAT scan with a cervical myelogram that show  left C6-7 and  C5-6 foraminal stenosis Qualifier: Diagnosis of  By: Larose Kells MD, [place] 05/12/2006   Qualifier: Diagnosis of  By: Larose Kells MD, [place], TYPE II 05/12/2006   Now following w/ Endo at [place] 04/27/2007   Qualifier: Diagnosis of  By: Jerold Coombe     Dyspnea 02/10/2016   Encephalopathy, hypertensive    Essential hypertension 05/12/2006   Qualifier: Diagnosis of  ByLarose Kells MD, [place]    Fatigue 05/31/2014   GAIT DISTURBANCE 08/07/2009   Qualifier: Diagnosis of  By: Larose Kells MD, [place]    GERD (gastroesophageal reflux disease)    Headache(784.0) 11/27/2010   Hyperlipidemia    Hyperlipidemia associated with type 2 diabetes mellitus ([place]) 05/12/2006   Qualifier: Diagnosis of  By: Larose Kells MD, [place]    ILD (interstitial lung disease) ([place]) 05/31/2015   Leukocytopenia 05/31/2014   Lower abdominal pain 05/17/2014   Nausea with vomiting 09/22/2013   NECK PAIN, CHRONIC 04/03/2008   Qualifier: Diagnosis of  By: Larose Kells MD, [place]    Nodule on liver  12/06/2015   Osteoarthritis 02/10/2016   Osteopenia    Osteoporosis 06/23/2006   Annotation: had a bone density test in 08-2004.  T score was -2.4 Qualifier: Diagnosis of  By: Larose Kells MD, [place]     [facility] (subarachnoid hemorrhage) ([place])    Sciatica    Scleroderma ([place]) 02/28/2016   Severe hypertension 03/18/2018   Slurred speech 11/27/2010   Takotsubo cardiomyopathy    s/p stress MI with stress induced CM with normal coronary arteries by cath 2007 with normalization of LVF by echo 04/2005   TIA (transient ischemic attack)    Unspecified cerebral artery occlusion with cerebral infarction 04/07/2012   UTI (urinary tract infection) 11/02/2011   Vasculitis of skin 09/28/2012   Vitamin D deficiency 10/01/2014    PSH:  Past Surgical History:  Procedure Laterality Date   ABDOMINAL HYSTERECTOMY  1977   no oophorectomy   APPENDECTOMY     CARDIAC CATHETERIZATION     CATARACT EXTRACTION, BILATERAL  2011   SPINAL FUSION  2000   Dr. Vertell Limber   TONSILLECTOMY      Social History:  Social History   Socioeconomic History   Marital status: Divorced    Spouse name: Not on file   Number of children: 2   Years of education: Not on file   Highest education level: Not on file  Occupational History   Occupation: Retired    Comment: Accounting  Tobacco Use   Smoking status: Never    Passive exposure: Yes   Smokeless tobacco: Never   Tobacco comments:    Mother & Children  Vaping Use   Vaping Use: Never used  Substance and Sexual Activity   Alcohol use: Yes    Alcohol/week: 1.0 standard drink    Types: 1 Glasses of wine per week    Comment: occ   Drug use: No   Sexual activity: Never  Other Topics Concern   Not on file  Social History Narrative   10/21/20 Lives with her daughter and G-daughter   Diet- working on portion control   Exercise- no routine exercise but active      [facility]:   Originally from [place]. Always lived in [place]. Previously has traveled to [place], [place], [place], [place], & [place]  [place]. Previously did accounting and clerical work. Has a dog currently. Remote exposure to a parakeet. No mold exposure.    Social Determinants of Health   Financial Resource Strain: Not on file  Food Insecurity: Not on file  Transportation Needs: Not on file  Physical Activity: Not on file  Stress: Not on file  Social Connections: Not on file  Intimate Partner Violence: Not on file    Family History:  Family History  Problem Relation Age of Onset   Intracerebral hemorrhage Daughter        assoc with post-partum  Stroke Mother    Diabetes Sister    Asthma Sister    Diabetes Sister    Breast cancer Other        ?Aunt   Cancer Other        breast?   Coronary artery disease Neg Hx     Medications:   Current Outpatient Medications on File Prior to Visit  Medication Sig Dispense Refill   acetaminophen (TYLENOL) 325 MG tablet Take 1-2 tablets (325-650 mg total) by mouth every 4 (four) hours as needed for mild pain.     amLODipine (NORVASC) 5 MG tablet 5 mg daily.     aspirin EC 81 MG tablet Take 1 tablet (81 mg total) by mouth daily. Swallow whole. 30 tablet 11   atorvastatin (LIPITOR) 40 MG tablet Take 1 tablet (40 mg total) by mouth daily. 30 tablet 0   clonazePAM (KLONOPIN) 0.5 MG disintegrating tablet Take 1 tablet (0.5 mg total) by mouth at bedtime. 60 tablet 0   famotidine (PEPCID) 40 MG tablet Take 0.5 tablets (20 mg total) by mouth daily. 15 tablet 0   glimepiride (AMARYL) 2 MG tablet Take 1 tablet (2 mg total) by mouth daily with breakfast. 30 tablet 0   insulin detemir (LEVEMIR) 100 UNIT/ML injection Inject 0.07 mLs (7 Units total) into the skin 2 (two) times daily. (Patient taking differently: Inject 7 Units into the skin 2 (two) times daily. Now taking 8 units) 10 mL 0   lisinopril (ZESTRIL) 40 MG tablet Take 1 tablet (40 mg total) by mouth daily. 30 tablet 0   metoprolol succinate (TOPROL-XL) 50 MG 24 hr tablet Take 1 tablet (50 mg total) by mouth daily. Take with  or immediately following a meal. 30 tablet 0   Multiple Vitamins-Minerals (CENTRUM ADULTS PO) Take by mouth.     pantoprazole (PROTONIX) 40 MG tablet Take 1 tablet (40 mg total) by mouth daily. 30 tablet 0   vitamin B-12 (CYANOCOBALAMIN) 500 MCG tablet Take 1 tablet (500 mcg total) by mouth daily. 30 tablet 0   topiramate (TOPAMAX) 25 MG tablet Take 1 tablet (25 mg total) by mouth at bedtime. (Patient not taking: Reported on 10/21/2020) 30 tablet 0   No current facility-administered medications on file prior to visit.    Allergies:   Allergies  Allergen Reactions   Cephalexin Nausea And Vomiting    Pt stated made severely sick, will never take again   Nintedanib Diarrhea   Pioglitazone Other (See Comments)    REACTION: EDEMA      OBJECTIVE:  Physical Exam  Vitals:   10/21/20 0909  BP: (!) 150/82  Pulse: 78  Weight: 136 lb (61.7 kg)  Height: 5' (1.524 m)    Body mass index is 26.56 kg/m. No results found.  General: well developed, well nourished,  pleasant elderly Caucasian female, seated, in no evident distress Head: head normocephalic and atraumatic.   Neck: supple with no carotid or supraclavicular bruits Cardiovascular: regular rate and rhythm, no murmurs Musculoskeletal: no deformity Skin:  no rash/petichiae Vascular:  Normal pulses all extremities   Neurologic Exam Mental Status: Awake and fully alert.   Fluent speech and language.  Oriented to place and time. Recent memory slightly impaired and remote memory intact. Attention span, concentration and fund of knowledge intact.  Mood and affect appropriate Cranial Nerves: Pupils equal, briskly reactive to light. Extraocular movements full without nystagmus. Visual fields full to confrontation. Hearing intact. Facial sensation intact. Face, tongue, palate moves normally and symmetrically.  Motor: Normal bulk and tone. Normal strength in all tested extremity muscles. Sensory.: intact to touch , pinprick , position and  vibratory sensation.  Coordination: Rapid alternating movements normal in all extremities. Finger-to-nose and heel-to-shin performed accurately bilaterally. Gait and Station: Arises from chair without difficulty. Stance is normal. Gait demonstrates normal stride length and balance without use of assistive device Reflexes: 1+ and symmetric. Toes downgoing.       ASSESSMENT/PLAN: Sandra [name] is a 87 y.o. year old female presented with headache on 06/18/2019 with evidence of right occipital ICH possibly hypertensive versus amyloid angiopathy. Vascular risk factors include HTN, HLD, DM, prior strokes and ICH, cognitive decline and depression/anxiety.     R occipital ICH:  Residual deficit: Mild cognitive impairment and PTSD from hospital stay (see prior OV note for details).  Overall, greatly improved since prior visit. Feels functioning and cognition at baseline.  Continues to follow with PCP for PTSD/anxiety Residual headaches: Tapered off Topamax.  Occasional very mild headaches -continue Tylenol as needed Continue aspirin 81 mg daily and atorvastatin 40 mg daily for secondary stroke prevention Discussed secondary stroke prevention measures and importance of close PCP follow-up for aggressive stroke risk factor management of HTN, HLD and DM HTN: BP goal<130/90.  Stable on metoprolol, amlodipine and lisinopril per PCP.   HLD: LDL goal<70.  On atorvastatin 40 mg daily per PCP. No lipid panel since 06/2019. She plans on discussing repeat lipid panel with PCP at next visit 10/13 (she is unsure if nephrology checks during routine labs) DMII: A1c goal<7.0. Recent A1c 11.3 down from 12.5. recent insulin adjustments by PCP    Overall stable from stroke standpoint with routine follow-up with PCP for aggressive stroke risk factor management and recommend follow-up on an as-needed basi which patient agreed with   CC:  Eksir, Earnest Conroy, MD    I spent 34 minutes of face-to-face and non-face-to-face  time with patient.  This included previsit chart review, lab review, study review, electronic health record documentation, patient education and discussion regarding prior strokes, secondary stroke prevention measures and importance of managing stroke risk factors and answered all other questions to patient satisfaction  Frann Rider, [facility]  [facility] [address] [place], [place] [address]  Phone [phone] Fax [phone] Note: This document was prepared with digital dictation and possible smart phrase technology. Any transcriptional errors that result from this process are unintentional.

## 2021-02-04 DIAGNOSIS — E1122 Type 2 diabetes mellitus with diabetic chronic kidney disease: Secondary | ICD-10-CM | POA: Diagnosis not present

## 2021-02-04 DIAGNOSIS — I129 Hypertensive chronic kidney disease with stage 1 through stage 4 chronic kidney disease, or unspecified chronic kidney disease: Secondary | ICD-10-CM | POA: Diagnosis not present

## 2021-02-04 DIAGNOSIS — E1142 Type 2 diabetes mellitus with diabetic polyneuropathy: Secondary | ICD-10-CM | POA: Diagnosis not present

## 2021-02-04 DIAGNOSIS — E1121 Type 2 diabetes mellitus with diabetic nephropathy: Secondary | ICD-10-CM | POA: Diagnosis not present

## 2021-02-04 DIAGNOSIS — E1165 Type 2 diabetes mellitus with hyperglycemia: Secondary | ICD-10-CM | POA: Diagnosis not present

## 2021-02-04 DIAGNOSIS — Z794 Long term (current) use of insulin: Secondary | ICD-10-CM | POA: Diagnosis not present

## 2021-02-04 DIAGNOSIS — E1149 Type 2 diabetes mellitus with other diabetic neurological complication: Secondary | ICD-10-CM | POA: Diagnosis not present

## 2021-02-04 DIAGNOSIS — E113293 Type 2 diabetes mellitus with mild nonproliferative diabetic retinopathy without macular edema, bilateral: Secondary | ICD-10-CM | POA: Diagnosis not present

## 2021-02-04 DIAGNOSIS — Z7984 Long term (current) use of oral hypoglycemic drugs: Secondary | ICD-10-CM | POA: Diagnosis not present

## 2021-02-06 DIAGNOSIS — F418 Other specified anxiety disorders: Secondary | ICD-10-CM | POA: Diagnosis not present

## 2021-02-06 DIAGNOSIS — M5030 Other cervical disc degeneration, unspecified cervical region: Secondary | ICD-10-CM | POA: Diagnosis not present

## 2021-02-06 DIAGNOSIS — I10 Essential (primary) hypertension: Secondary | ICD-10-CM | POA: Diagnosis not present

## 2021-02-06 DIAGNOSIS — E7800 Pure hypercholesterolemia, unspecified: Secondary | ICD-10-CM | POA: Diagnosis not present

## 2021-02-06 DIAGNOSIS — K219 Gastro-esophageal reflux disease without esophagitis: Secondary | ICD-10-CM | POA: Diagnosis not present

## 2021-02-06 DIAGNOSIS — G9340 Encephalopathy, unspecified: Secondary | ICD-10-CM | POA: Diagnosis not present

## 2021-02-06 DIAGNOSIS — E1165 Type 2 diabetes mellitus with hyperglycemia: Secondary | ICD-10-CM | POA: Diagnosis not present

## 2021-02-06 DIAGNOSIS — F4310 Post-traumatic stress disorder, unspecified: Secondary | ICD-10-CM | POA: Diagnosis not present

## 2021-02-06 DIAGNOSIS — N189 Chronic kidney disease, unspecified: Secondary | ICD-10-CM | POA: Diagnosis not present

## 2021-02-10 DIAGNOSIS — L570 Actinic keratosis: Secondary | ICD-10-CM | POA: Diagnosis not present

## 2021-03-17 ENCOUNTER — Emergency Department (HOSPITAL_COMMUNITY): Payer: Medicare HMO

## 2021-03-17 ENCOUNTER — Inpatient Hospital Stay (HOSPITAL_COMMUNITY)
Admission: EM | Admit: 2021-03-17 | Discharge: 2021-03-20 | DRG: 683 | Disposition: A | Discharge status: Home Health | Payer: Medicare HMO | Attending: Internal Medicine | Admitting: Internal Medicine

## 2021-03-17 ENCOUNTER — Other Ambulatory Visit: Payer: Self-pay

## 2021-03-17 DIAGNOSIS — F32A Depression, unspecified: Secondary | ICD-10-CM | POA: Diagnosis not present

## 2021-03-17 DIAGNOSIS — F411 Generalized anxiety disorder: Secondary | ICD-10-CM | POA: Diagnosis present

## 2021-03-17 DIAGNOSIS — I5181 Takotsubo syndrome: Secondary | ICD-10-CM | POA: Diagnosis not present

## 2021-03-17 DIAGNOSIS — Z794 Long term (current) use of insulin: Secondary | ICD-10-CM

## 2021-03-17 DIAGNOSIS — E1169 Type 2 diabetes mellitus with other specified complication: Secondary | ICD-10-CM | POA: Diagnosis present

## 2021-03-17 DIAGNOSIS — E785 Hyperlipidemia, unspecified: Secondary | ICD-10-CM

## 2021-03-17 DIAGNOSIS — I6523 Occlusion and stenosis of bilateral carotid arteries: Secondary | ICD-10-CM | POA: Diagnosis present

## 2021-03-17 DIAGNOSIS — N281 Cyst of kidney, acquired: Secondary | ICD-10-CM | POA: Diagnosis not present

## 2021-03-17 DIAGNOSIS — N1832 Chronic kidney disease, stage 3b: Secondary | ICD-10-CM | POA: Diagnosis not present

## 2021-03-17 DIAGNOSIS — I2510 Atherosclerotic heart disease of native coronary artery without angina pectoris: Secondary | ICD-10-CM | POA: Diagnosis present

## 2021-03-17 DIAGNOSIS — N189 Chronic kidney disease, unspecified: Secondary | ICD-10-CM | POA: Diagnosis not present

## 2021-03-17 DIAGNOSIS — Z9841 Cataract extraction status, right eye: Secondary | ICD-10-CM | POA: Diagnosis not present

## 2021-03-17 DIAGNOSIS — E11319 Type 2 diabetes mellitus with unspecified diabetic retinopathy without macular edema: Secondary | ICD-10-CM | POA: Diagnosis present

## 2021-03-17 DIAGNOSIS — E875 Hyperkalemia: Secondary | ICD-10-CM | POA: Diagnosis present

## 2021-03-17 DIAGNOSIS — F419 Anxiety disorder, unspecified: Secondary | ICD-10-CM

## 2021-03-17 DIAGNOSIS — J9811 Atelectasis: Secondary | ICD-10-CM | POA: Diagnosis present

## 2021-03-17 DIAGNOSIS — Z79899 Other long term (current) drug therapy: Secondary | ICD-10-CM

## 2021-03-17 DIAGNOSIS — F05 Delirium due to known physiological condition: Secondary | ICD-10-CM | POA: Diagnosis not present

## 2021-03-17 DIAGNOSIS — Z7982 Long term (current) use of aspirin: Secondary | ICD-10-CM

## 2021-03-17 DIAGNOSIS — E1165 Type 2 diabetes mellitus with hyperglycemia: Secondary | ICD-10-CM | POA: Diagnosis present

## 2021-03-17 DIAGNOSIS — Z825 Family history of asthma and other chronic lower respiratory diseases: Secondary | ICD-10-CM | POA: Diagnosis not present

## 2021-03-17 DIAGNOSIS — R2990 Unspecified symptoms and signs involving the nervous system: Secondary | ICD-10-CM

## 2021-03-17 DIAGNOSIS — Z823 Family history of stroke: Secondary | ICD-10-CM

## 2021-03-17 DIAGNOSIS — R200 Anesthesia of skin: Secondary | ICD-10-CM | POA: Diagnosis not present

## 2021-03-17 DIAGNOSIS — Z981 Arthrodesis status: Secondary | ICD-10-CM | POA: Diagnosis not present

## 2021-03-17 DIAGNOSIS — N289 Disorder of kidney and ureter, unspecified: Secondary | ICD-10-CM | POA: Diagnosis not present

## 2021-03-17 DIAGNOSIS — R410 Disorientation, unspecified: Secondary | ICD-10-CM

## 2021-03-17 DIAGNOSIS — I129 Hypertensive chronic kidney disease with stage 1 through stage 4 chronic kidney disease, or unspecified chronic kidney disease: Secondary | ICD-10-CM | POA: Diagnosis present

## 2021-03-17 DIAGNOSIS — R531 Weakness: Secondary | ICD-10-CM

## 2021-03-17 DIAGNOSIS — I10 Essential (primary) hypertension: Secondary | ICD-10-CM | POA: Diagnosis not present

## 2021-03-17 DIAGNOSIS — E8889 Other specified metabolic disorders: Secondary | ICD-10-CM | POA: Diagnosis not present

## 2021-03-17 DIAGNOSIS — Z20822 Contact with and (suspected) exposure to covid-19: Secondary | ICD-10-CM | POA: Diagnosis not present

## 2021-03-17 DIAGNOSIS — Z9842 Cataract extraction status, left eye: Secondary | ICD-10-CM

## 2021-03-17 DIAGNOSIS — R739 Hyperglycemia, unspecified: Secondary | ICD-10-CM | POA: Diagnosis not present

## 2021-03-17 DIAGNOSIS — I639 Cerebral infarction, unspecified: Secondary | ICD-10-CM | POA: Diagnosis not present

## 2021-03-17 DIAGNOSIS — R29818 Other symptoms and signs involving the nervous system: Secondary | ICD-10-CM | POA: Diagnosis not present

## 2021-03-17 DIAGNOSIS — G459 Transient cerebral ischemic attack, unspecified: Secondary | ICD-10-CM

## 2021-03-17 DIAGNOSIS — N179 Acute kidney failure, unspecified: Principal | ICD-10-CM

## 2021-03-17 DIAGNOSIS — Z8673 Personal history of transient ischemic attack (TIA), and cerebral infarction without residual deficits: Secondary | ICD-10-CM

## 2021-03-17 DIAGNOSIS — K219 Gastro-esophageal reflux disease without esophagitis: Secondary | ICD-10-CM | POA: Diagnosis present

## 2021-03-17 DIAGNOSIS — Z833 Family history of diabetes mellitus: Secondary | ICD-10-CM

## 2021-03-17 DIAGNOSIS — M810 Age-related osteoporosis without current pathological fracture: Secondary | ICD-10-CM | POA: Diagnosis present

## 2021-03-17 DIAGNOSIS — Z888 Allergy status to other drugs, medicaments and biological substances status: Secondary | ICD-10-CM

## 2021-03-17 DIAGNOSIS — E1122 Type 2 diabetes mellitus with diabetic chronic kidney disease: Secondary | ICD-10-CM

## 2021-03-17 LAB — CBC WITH DIFFERENTIAL/PLATELET
Abs Immature Granulocytes: 0.02 10*3/uL (ref 0.00–0.07)
Basophils Absolute: 0 10*3/uL (ref 0.0–0.1)
Basophils Relative: 0 %
Eosinophils Absolute: 0.1 10*3/uL (ref 0.0–0.5)
Eosinophils Relative: 1 %
HCT: 35.9 % — ABNORMAL LOW (ref 36.0–46.0)
Hemoglobin: 11.8 g/dL — ABNORMAL LOW (ref 12.0–15.0)
Immature Granulocytes: 0 %
Lymphocytes Relative: 24 %
Lymphs Abs: 2 10*3/uL (ref 0.7–4.0)
MCH: 32.1 pg (ref 26.0–34.0)
MCHC: 32.9 g/dL (ref 30.0–36.0)
MCV: 97.6 fL (ref 80.0–100.0)
Monocytes Absolute: 0.5 10*3/uL (ref 0.1–1.0)
Monocytes Relative: 6 %
Neutro Abs: 5.6 10*3/uL (ref 1.7–7.7)
Neutrophils Relative %: 69 %
Platelets: 201 10*3/uL (ref 150–400)
RBC: 3.68 MIL/uL — ABNORMAL LOW (ref 3.87–5.11)
RDW: 11.7 % (ref 11.5–15.5)
WBC: 8.3 10*3/uL (ref 4.0–10.5)
nRBC: 0 % (ref 0.0–0.2)

## 2021-03-17 LAB — URINALYSIS, ROUTINE W REFLEX MICROSCOPIC
Bilirubin Urine: NEGATIVE
Glucose, UA: 150 mg/dL — AB
Hgb urine dipstick: NEGATIVE
Ketones, ur: NEGATIVE mg/dL
Leukocytes,Ua: NEGATIVE
Nitrite: NEGATIVE
Protein, ur: 100 mg/dL — AB
Specific Gravity, Urine: 1.013 (ref 1.005–1.030)
pH: 5 (ref 5.0–8.0)

## 2021-03-17 LAB — RAPID URINE DRUG SCREEN, HOSP PERFORMED
Amphetamines: NOT DETECTED
Barbiturates: NOT DETECTED
Benzodiazepines: NOT DETECTED
Cocaine: NOT DETECTED
Opiates: POSITIVE — AB
Tetrahydrocannabinol: NOT DETECTED

## 2021-03-17 LAB — COMPREHENSIVE METABOLIC PANEL
ALT: 16 U/L (ref 0–44)
AST: 21 U/L (ref 15–41)
Albumin: 3.7 g/dL (ref 3.5–5.0)
Alkaline Phosphatase: 83 U/L (ref 38–126)
Anion gap: 12 (ref 5–15)
BUN: 43 mg/dL — ABNORMAL HIGH (ref 8–23)
CO2: 18 mmol/L — ABNORMAL LOW (ref 22–32)
Calcium: 9.3 mg/dL (ref 8.9–10.3)
Chloride: 105 mmol/L (ref 98–111)
Creatinine, Ser: 1.69 mg/dL — ABNORMAL HIGH (ref 0.44–1.00)
GFR, Estimated: 29 mL/min — ABNORMAL LOW (ref >60)
Glucose, Bld: 299 mg/dL — ABNORMAL HIGH (ref 70–99)
Potassium: 5.1 mmol/L (ref 3.5–5.1)
Sodium: 135 mmol/L (ref 135–145)
Total Bilirubin: 0.6 mg/dL (ref 0.3–1.2)
Total Protein: 6.8 g/dL (ref 6.5–8.1)

## 2021-03-17 LAB — ETHANOL: Alcohol, Ethyl (B): <10 mg/dL (ref <10)

## 2021-03-17 LAB — I-STAT CHEM 8, ED
BUN: 68 mg/dL — ABNORMAL HIGH (ref 8–23)
Calcium, Ion: 1.12 mmol/L — ABNORMAL LOW (ref 1.15–1.40)
Chloride: 108 mmol/L (ref 98–111)
Creatinine, Ser: 1.8 mg/dL — ABNORMAL HIGH (ref 0.44–1.00)
Glucose, Bld: 369 mg/dL — ABNORMAL HIGH (ref 70–99)
HCT: 36 % (ref 36.0–46.0)
Hemoglobin: 12.2 g/dL (ref 12.0–15.0)
Potassium: 7.7 mmol/L (ref 3.5–5.1)
Sodium: 132 mmol/L — ABNORMAL LOW (ref 135–145)
TCO2: 22 mmol/L (ref 22–32)

## 2021-03-17 LAB — APTT: aPTT: <20 seconds — ABNORMAL LOW (ref 24–36)

## 2021-03-17 LAB — TROPONIN I (HIGH SENSITIVITY): Troponin I (High Sensitivity): 9 ng/L (ref <18)

## 2021-03-17 LAB — RESP PANEL BY RT-PCR (FLU A&B, COVID) ARPGX2
Influenza A by PCR: NEGATIVE
Influenza B by PCR: NEGATIVE
SARS Coronavirus 2 by RT PCR: NEGATIVE

## 2021-03-17 LAB — PROTIME-INR
INR: 1 (ref 0.8–1.2)
Prothrombin Time: 13.6 seconds (ref 11.4–15.2)

## 2021-03-17 MED ORDER — LORAZEPAM 2 MG/ML IJ SOLN
1.0000 mg | Freq: Once | INTRAMUSCULAR | Status: DC
Start: 2021-03-17 — End: 2021-03-20

## 2021-03-17 MED ORDER — ACETAMINOPHEN 325 MG PO TABS
650.0000 mg | ORAL_TABLET | Freq: Once | ORAL | Status: AC
Start: 2021-03-17 — End: 2021-03-18
  Administered 2021-03-18: 650 mg via ORAL
  Filled 2021-03-17: qty 2

## 2021-03-17 MED ORDER — LIDOCAINE 5 % EX PTCH
1.0000 | MEDICATED_PATCH | CUTANEOUS | Status: DC
  Administered 2021-03-18 – 2021-03-19 (×3): 1 via TRANSDERMAL
  Filled 2021-03-17 (×2): qty 1

## 2021-03-17 MED ORDER — LORAZEPAM 2 MG/ML IJ SOLN
1.0000 mg | Freq: Once | INTRAMUSCULAR | Status: AC
  Administered 2021-03-17: 1 mg via INTRAVENOUS
  Filled 2021-03-17: qty 1

## 2021-03-17 NOTE — ED Triage Notes (Signed)
Pt from home-daughter called EMS for eval of AMS. Daughter with patient for the morning and at noon became disoriented and having trouble with simple tasks such as fill out checks, etc. Pt c/o headache.

## 2021-03-17 NOTE — ED Provider Notes (Signed)
[facility] EMERGENCY DEPARTMENT Provider Note   CSN: [id number] Arrival date & time: 03/17/21  1427     History  Chief Complaint  Patient presents with   Altered Mental Status    Sandra [name] is a 88 y.o. female.  Pt is an 88 yo wf with a hx of cva, gerd, cardiomyopathy, cad, htn, and osteoporosis.  She presents to the ED today with AMS.  Pt was fine this morning and abruptly became disoriented around 12 noon.  Pt was writing a check and suddenly could not write the check.  She said she could not walk.  She has some left sided numbness.  She has some left sided vision loss (?old).  Pt is very anxious about a nurse who took care of her in the ICU last visit.      Home Medications Prior to Admission medications   Medication Sig Start Date End Date Taking? Authorizing Provider  acetaminophen (TYLENOL) 325 MG tablet Take 1-2 tablets (325-650 mg total) by mouth every 4 (four) hours as needed for mild pain. 07/05/19   Love, Ivan Anchors, PA-C  amLODipine (NORVASC) 10 MG tablet Take 10 mg by mouth daily.    [provider]  amLODipine (NORVASC) 5 MG tablet 5 mg daily. 01/26/20   [provider]  aspirin EC 81 MG tablet Take 1 tablet (81 mg total) by mouth daily. Swallow whole. 08/30/19   Frann Rider, NP  atorvastatin (LIPITOR) 40 MG tablet Take 1 tablet (40 mg total) by mouth daily. 07/06/19   Love, Ivan Anchors, PA-C  clonazePAM (KLONOPIN) 0.5 MG disintegrating tablet Take 1 tablet (0.5 mg total) by mouth at bedtime. 06/22/19   Donzetta Starch, NP  famotidine (PEPCID) 40 MG tablet Take 0.5 tablets (20 mg total) by mouth daily. 07/05/19 02/04/21  Love, Ivan Anchors, PA-C  glimepiride (AMARYL) 2 MG tablet Take 1 tablet (2 mg total) by mouth daily with breakfast. 07/06/19   Love, Ivan Anchors, PA-C  HYDROcodone-acetaminophen (NORCO/VICODIN) 5-325 MG tablet Take 1 tablet by mouth every 6 (six) hours as needed.    [provider]  insulin detemir (LEVEMIR) 100 UNIT/ML  injection Inject 0.07 mLs (7 Units total) into the skin 2 (two) times daily. Patient taking differently: Inject 7 Units into the skin 2 (two) times daily. Now taking 8 units 07/05/19   Love, Ivan Anchors, PA-C  lisinopril (ZESTRIL) 40 MG tablet Take 1 tablet (40 mg total) by mouth daily. 07/05/19   Love, Ivan Anchors, PA-C  metoprolol succinate (TOPROL-XL) 50 MG 24 hr tablet Take 1 tablet (50 mg total) by mouth daily. Take with or immediately following a meal. 07/05/19   Love, Ivan Anchors, PA-C  Multiple Vitamins-Minerals (CENTRUM ADULTS PO) Take by mouth.    [provider]  pantoprazole (PROTONIX) 40 MG tablet Take 1 tablet (40 mg total) by mouth daily. 07/06/19   Love, Ivan Anchors, PA-C  topiramate (TOPAMAX) 25 MG tablet Take 1 tablet (25 mg total) by mouth at bedtime. Patient not taking: Reported on 10/21/2020 11/20/19   Frann Rider, NP  vitamin B-12 (CYANOCOBALAMIN) 500 MCG tablet Take 1 tablet (500 mcg total) by mouth daily. 07/06/19   Love, Ivan Anchors, PA-C      Allergies    Cephalexin, Nintedanib, and Pioglitazone    Review of Systems   Review of Systems  Neurological:  Positive for weakness and numbness.  All other systems reviewed and are negative.  Physical Exam Updated Vital Signs BP (!) 186/73 (BP  Location: Right Arm)    Pulse 83    Temp 98 F (36.7 C) (Oral)    Resp 20    SpO2 95%  Physical Exam Vitals and nursing note reviewed.  Constitutional:      Appearance: Normal appearance.  HENT:     Head: Normocephalic and atraumatic.     Right Ear: External ear normal.     Left Ear: External ear normal.     Nose: Nose normal.     Mouth/Throat:     Mouth: Mucous membranes are moist.     Pharynx: Oropharynx is clear.  Eyes:     Extraocular Movements: Extraocular movements intact.     Conjunctiva/sclera: Conjunctivae normal.     Pupils: Pupils are equal, round, and reactive to light.  Cardiovascular:     Rate and Rhythm: Normal rate and regular rhythm.     Pulses: Normal pulses.      Heart sounds: Normal heart sounds.  Pulmonary:     Effort: Pulmonary effort is normal.     Breath sounds: Normal breath sounds.  Abdominal:     General: Abdomen is flat. Bowel sounds are normal.     Palpations: Abdomen is soft.  Musculoskeletal:        General: Normal range of motion.     Cervical back: Normal range of motion and neck supple.  Skin:    General: Skin is warm.     Capillary Refill: Capillary refill takes less than 2 seconds.  Neurological:     Mental Status: She is alert.     Comments: Numbness to left arm and leg.  Vision defect on left.  Psychiatric:        Mood and Affect: Mood normal.        Behavior: Behavior normal.    ED Results / Procedures / Treatments   Labs (all labs ordered are listed, but only abnormal results are displayed) Labs Reviewed  APTT - Abnormal; Notable for the following components:      Result Value   aPTT <20 (*)    All other components within normal limits  I-STAT CHEM 8, ED - Abnormal; Notable for the following components:   Sodium 132 (*)    Potassium 7.7 (*)    BUN 68 (*)    Creatinine, Ser 1.80 (*)    Glucose, Bld 369 (*)    Calcium, Ion 1.12 (*)    All other components within normal limits  RESP PANEL BY RT-PCR (FLU A&B, COVID) ARPGX2  ETHANOL  PROTIME-INR  RAPID URINE DRUG SCREEN, HOSP PERFORMED  URINALYSIS, ROUTINE W REFLEX MICROSCOPIC  CBC WITH DIFFERENTIAL/PLATELET  COMPREHENSIVE METABOLIC PANEL    EKG None  Radiology CT HEAD CODE STROKE WO CONTRAST  Result Date: 03/17/2021 CLINICAL DATA:  Code stroke.  Neuro deficit, acute, stroke suspected EXAM: CT HEAD WITHOUT CONTRAST TECHNIQUE: Contiguous axial images were obtained from the base of the skull through the vertex without intravenous contrast. RADIATION DOSE REDUCTION: This exam was performed according to the departmental dose-optimization program which includes automated exposure control, adjustment of the mA and/or kV according to patient size and/or use of  iterative reconstruction technique. COMPARISON:  06/18/2019 FINDINGS: Brain: No acute intracranial hemorrhage, mass effect, or edema. No new loss of gray-white differentiation. There is encephalomalacia/gliosis in the right occipitotemporal lobes at the site of prior hemorrhage. Additional patchy and confluent areas of hypoattenuation in the supratentorial white matter are nonspecific but probably reflect moderate chronic microvascular ischemic changes. Prominence of the ventricles and  sulci reflects parenchymal volume loss. There is no extra-axial collection. Vascular: No hyperdense vessel. Skull: Unremarkable. Sinuses/Orbits: Head no acute abnormality. Other: Mastoid air cells are clear. ASPECTS ([place] Stroke Program Early CT Score) - Ganglionic level infarction (caudate, lentiform nuclei, internal capsule, insula, M1-M3 cortex): 7 - Supraganglionic infarction (M4-M6 cortex): 3 Total score (0-10 with 10 being normal): 10 IMPRESSION: There is no acute intracranial hemorrhage or evidence of acute infarction. ASPECT score is 10. These results were communicated to Dr. Lorrin Goodell at 3:03 pm on 03/17/2021 by text page via the [facility] messaging system. Electronically Signed   By: Macy Mis M.D.   On: 03/17/2021 15:05    Procedures Procedures    Medications Ordered in ED Medications  LORazepam (ATIVAN) injection 1 mg (1 mg Intravenous Given 03/17/21 1630)    ED Course/ Medical Decision Making/ A&P                           Medical Decision Making Amount and/or Complexity of Data Reviewed Labs: ordered. Radiology: ordered.  Risk Prescription drug management.   This patient presents to the ED for concern of cva, ams, this involves an extensive number of treatment options, and is a complaint that carries with it a high risk of complications and morbidity.  The differential diagnosis includes stroke, metabolic d/o, infection   Co morbidities that complicate the patient evaluation  Prior  stroke   Additional history obtained:  Additional history obtained from epic chart review External records from outside source obtained and reviewed including daughter   Lab Tests:  I Ordered, and personally interpreted labs.  The pertinent results include:  elevated K, but this is hemolyzed.  K re-drawn   Imaging Studies ordered:  I ordered imaging studies including ct head  I independently visualized and interpreted imaging which showed nothing acute I agree with the radiologist interpretation   Cardiac Monitoring:  The patient was maintained on a cardiac monitor.  I personally viewed and interpreted the cardiac monitored which showed an underlying rhythm of: nsr   Medicines ordered and prescription drug management:  I ordered medication including ativan  for sedation  Reevaluation of the patient after these medicines showed that the patient improved I have reviewed the patients home medicines and have made adjustments as needed   Test Considered:  MRI brain to r/o new stroke   Critical Interventions:  Code stroke.  Pt not a TNK candidate as severity is not bad enough   Consultations Obtained:  I requested consultation with the neurologist,  and discussed lab and imaging findings as well as pertinent plan - they recommend: MRI brain   Problem List / ED Course:  AMS:  Labs pending at shift change.  MRI pending.   Reevaluation:  After the interventions noted above, I reevaluated the patient and found that they have :stayed the same   Social Determinants of Health:  Lives at home   Pt signed out to Dr. Pearline Cables at shift change.        Final Clinical Impression(s) / ED Diagnoses Final diagnoses:  None    Rx / DC Orders ED Discharge Orders     None         Isla Pence, MD 03/17/21 [phone]

## 2021-03-17 NOTE — ED Provider Notes (Signed)
°  Provider Note MRN:  [id number]  Arrival date & time: 03/17/21    ED Course and Medical Decision Making  Assumed care from Dr Gilford Raid at shift change at 5:24 PM  See not from prior team for complete details, in brief: 88 year old female history of prior CVA, cardiomyopathy, CAD, GERD, to the emergency department today with AMS.  Normal state of health until around 12 noon this afternoon.  Agraphia, difficulty walking, left-sided numbness/?weakness, possible chronic left-sided vision loss.  Not TNK candidate per neurology.  Was eval by neurology 2/2 stroke activation, MRI brain was requested by neuro team.  Patient intermittently agitated, noncompliant with care.  Refused to go to MRI unless she is allowed to urinate beforehand.  Patient was refusing PureWick or bedpan.  She has used bedside commode successfully.  Patient finally get back to MRI and she was refusing MRI because she had to urinate again.   Labs reviewed and noted been stable.  Discussed with patient and family at the bedside.  Pt is much more complaint at this time, complaining of a HA but otherwise er numbness and weakness seems to have resolved. Has not attempted to ambulate while in the ED. She is  able to write in her typical manner per family at bedside.  She is still pending MRI at this time. Concern for TIA vs CVA (pending MRI). Recommend admission.  Patient and daughter are agreeable.    D/w hospitalist who accepts pt for admission      Procedures  Final Clinical Impressions(s) / ED Diagnoses     ICD-10-CM   1. Stroke-like symptoms  R29.90     2. Delirium  R41.0       ED Discharge Orders     None       Discharge Instructions   None        Jeanell Sparrow, DO 03/17/21 2307

## 2021-03-17 NOTE — Consult Note (Addendum)
NEUROLOGY CONSULTATION NOTE   Date of service: March 17, 2021 Patient Name: Sandra [name] MRN:  [id number] DOB:  12-Jan-1934 Reason for consult: "L hemianopsia and L sensory deficit" Requesting Provider: Isla Pence, MD _ _ _   _ __   _ __ _ _  __ __   _ __   __ _  History of Present Illness  Sandra [name] is a 88 y.o. female with PMH significant for DM2, GERD, HTN, HLD, prior R occipital ICH possibly hypertensive versus amyloid angiopathy, anxiety, depression who presents with stumbling and lightheadedness.  Patient reports she has been feeling weak all over and called and lethargic since Saturday.  She reports that Sunday was a good day.  Today in the morning she was writing checks and she did fairly well.  She tried to stand up and felt lightheaded and felt weak and stumbling and had to hold onto objects.  She felt like she was going to fall if she let go.  She also reports that she was running into objects.  She also reports that she woke up with a headache this morning which is at the vertex. She took tylenol with no relief.  She was brought into the ED where she was noted to have left hemianopsia and a code stroke was activated.  Patient reports that the hemianopsia is residual from her prior stroke in May 2021 and she does not feel that this is worsened.  LKW: 1200 on 03/17/21 mRS: 2 tNKASE: not offered, only new deficit is sensory loss on the left. She has a prior ICH and concern for cerebral amyloid angiopathy on MRI brain which is a contraindication for tNKASE. Thrombectomy: not offered, symptoms too mild and low concern for LVO. NIHSS components Score: Comment  1a Level of Conscious 0[x] 1[] 2[] 3[]     1b LOC Questions 0[x] 1[] 2[]      1c LOC Commands 0[] 1[] 2[]      2 Best Gaze 0[x] 1[] 2[]      3 Visual 0[] 1[] 2[x] 3[]   She has stable prior left hemianopsia  4 Facial Palsy 0[x] 1[] 2[] 3[]     5a Motor Arm - left 0[x] 1[] 2[] 3[] 4[] UN[]   5b Motor Arm - Right  0[x] 1[] 2[] 3[] 4[] UN[]   6a Motor Leg - Left 0[x] 1[] 2[] 3[] 4[] UN[]   6b Motor Leg - Right 0[x] 1[] 2[] 3[] 4[] UN[]   7 Limb Ataxia 0[x] 1[] 2[] 3[] UN[]    8 Sensory 0[] 1[] 2[x] UN[]     9 Best Language 0[x] 1[] 2[] 3[]     10 Dysarthria 0[x] 1[] 2[] UN[]     11  Extinct. and Inattention 0[x]  1[]  2[]       TOTAL: 4     ROS   Constitutional Denies weight loss, fever and chills.   HEENT Denies changes in vision and hearing.   Respiratory Denies SOB and cough.   CV Denies palpitations and CP   GI Denies abdominal pain, nausea, vomiting and diarrhea.   GU Denies dysuria and urinary frequency.   MSK Denies myalgia and joint pain.   Skin Denies rash and pruritus.   Neurological Endorses headache and lightheadedness but no syncope.   Psychiatric Denies recent changes in mood. endorses anxiety but no depression.   Past History   Past Medical History:  Diagnosis Date   Abnormal blood finding 02/10/2016   Abrasion of ear canal  09/28/2012   Achilles tendon injury 11/27/2010   ACID REFLUX DISEASE 05/12/2006   Qualifier: Diagnosis of  By: Larose Kells MD, [place]    Acute MI [facility])    ALLERGIC RHINITIS 05/12/2006   Qualifier: Diagnosis of  By: Larose Kells MD, [place]    Anxiety state 07/03/2009   Qualifier: Diagnosis of  By: Larose Kells MD, [place] pain 07/01/2010   Chronic kidney disease (CKD), stage III (moderate) (HCC) 05/31/2014   Chronic right SI joint pain 09/22/2013   DEGENERATIVE JOINT DISEASE, CERVICAL SPINE 06/23/2006   Annotation: had a CAT scan with a cervical myelogram that show  left C6-7 and  C5-6 foraminal stenosis Qualifier: Diagnosis of  By: Larose Kells MD, [place] 05/12/2006   Qualifier: Diagnosis of  By: Larose Kells MD, [place], TYPE II 05/12/2006   Now following w/ Endo at [place] 04/27/2007   Qualifier: Diagnosis of  By: Jerold Coombe     Dyspnea 02/10/2016   Encephalopathy, hypertensive    Essential hypertension 05/12/2006   Qualifier:  Diagnosis of  By: Larose Kells MD, [place]    Fatigue 05/31/2014   GAIT DISTURBANCE 08/07/2009   Qualifier: Diagnosis of  By: Larose Kells MD, [place].    GERD (gastroesophageal reflux disease)    Headache(784.0) 11/27/2010   Hyperlipidemia    Hyperlipidemia associated with type 2 diabetes mellitus ([place]) 05/12/2006   Qualifier: Diagnosis of  By: Larose Kells MD, [place]    ILD (interstitial lung disease) ([place]) 05/31/2015   Leukocytopenia 05/31/2014   Lower abdominal pain 05/17/2014   Nausea with vomiting 09/22/2013   NECK PAIN, CHRONIC 04/03/2008   Qualifier: Diagnosis of  By: Larose Kells MD, [place]    Nodule on liver 12/06/2015   Osteoarthritis 02/10/2016   Osteopenia    Osteoporosis 06/23/2006   Annotation: had a bone density test in 08-2004.  T score was -2.4 Qualifier: Diagnosis of  By: Larose Kells MD, [place]     [facility] (subarachnoid hemorrhage) ([place])    Sciatica    Scleroderma ([place]) 02/28/2016   Severe hypertension 03/18/2018   Slurred speech 11/27/2010   Takotsubo cardiomyopathy    s/p stress MI with stress induced CM with normal coronary arteries by cath 2007 with normalization of LVF by echo 04/2005   TIA (transient ischemic attack)    Unspecified cerebral artery occlusion with cerebral infarction 04/07/2012   UTI (urinary tract infection) 11/02/2011   Vasculitis of skin 09/28/2012   Vitamin D deficiency 10/01/2014   Past Surgical History:  Procedure Laterality Date   ABDOMINAL HYSTERECTOMY  1977   no oophorectomy   APPENDECTOMY     CARDIAC CATHETERIZATION     CATARACT EXTRACTION, BILATERAL  2011   SPINAL FUSION  2000   Dr. Vertell Limber   TONSILLECTOMY     Family History  Problem Relation Age of Onset   Intracerebral hemorrhage Daughter        assoc with post-partum   Stroke Mother    Diabetes Sister    Asthma Sister    Diabetes Sister    Breast cancer Other        ?Aunt   Cancer Other        breast?   Coronary artery disease Neg Hx    Social History   Socioeconomic History   Marital status: Divorced    Spouse name: Not  on file   Number of children: 2  Years of education: Not on file   Highest education level: Not on file  Occupational History   Occupation: Retired    Comment: Accounting  Tobacco Use   Smoking status: Never    Passive exposure: Yes   Smokeless tobacco: Never   Tobacco comments:    Mother & Children  Vaping Use   Vaping Use: Never used  Substance and Sexual Activity   Alcohol use: Yes    Alcohol/week: 1.0 standard drink    Types: 1 Glasses of wine per week    Comment: occ   Drug use: No   Sexual activity: Never  Other Topics Concern   Not on file  Social History Narrative   10/21/20 Lives with her daughter and G-daughter   Diet- working on portion control   Exercise- no routine exercise but active      [facility]:   Originally from [place]. Always lived in [place]. Previously has traveled to [place], [place], [place], [place], & [place]. Previously did accounting and clerical work. Has a dog currently. Remote exposure to a parakeet. No mold exposure.    Social Determinants of Health   Financial Resource Strain: Not on file  Food Insecurity: Not on file  Transportation Needs: Not on file  Physical Activity: Not on file  Stress: Not on file  Social Connections: Not on file   Allergies  Allergen Reactions   Cephalexin Nausea And Vomiting    Pt stated made severely sick, will never take again   Nintedanib Diarrhea   Pioglitazone Other (See Comments)    REACTION: EDEMA    Medications  (Not in a hospital admission)    Vitals   Vitals:   03/17/21 1436 03/17/21 1511  BP: 140/85 (!) 190/153  Pulse: 78 85  Resp: 15 17  Temp: 98 F (36.7 C)   TempSrc: Oral   SpO2: 98% 100%     There is no height or weight on file to calculate BMI.  Physical Exam   General: Laying comfortably in bed; in no acute distress.  HENT: Normal oropharynx and mucosa. Normal external appearance of ears and nose.  Neck: Supple, no pain or tenderness  CV: No JVD. No peripheral edema.  Pulmonary:  Symmetric Chest rise. Normal respiratory effort.  Abdomen: Soft to touch, non-tender.  Ext: No cyanosis, edema, or deformity  Skin: No rash. Normal palpation of skin.   Musculoskeletal: Normal digits and nails by inspection. No clubbing.   Neurologic Examination  Mental status/Cognition: Alert, oriented to self, place, month and year, good attention.  Speech/language: Fluent, comprehension intact, object naming intact, repetition intact.  Cranial nerves:   CN II Pupils equal and reactive to light, L hemianopsia   CN III,IV,VI EOM intact, no gaze preference or deviation, no nystagmus    CN V normal sensation in V1, V2, and V3 segments bilaterally   CN VII no asymmetry, no nasolabial fold flattening   CN VIII normal hearing to speech    CN IX & X normal palatal elevation, no uvular deviation    CN XI 5/5 head turn and 5/5 shoulder shrug bilaterally    CN XII midline tongue protrusion    Motor:  Muscle bulk: normal, tone normal, pronator drift none tremor none Mvmt Root Nerve  Muscle Right Left Comments  SA C5/6 Ax Deltoid 5 5   EF C5/6 Mc Biceps 5 5   EE C6/7/8 Rad Triceps 5 5   WF C6/7 Med FCR     WE C7/8 PIN  ECU     F Ab C8/T1 U ADM/FDI 5 5   HF L1/2/3 Fem Illopsoas 5 5   KE L2/3/4 Fem Quad 5 5   DF L4/5 D Peron Tib Ant 5 5   PF S1/2 Tibial Grc/Sol 5 5    Reflexes:  Right Left Comments  Pectoralis      Biceps (C5/6) 1 1   Brachioradialis (C5/6) 1 1    Triceps (C6/7) 1 1    Patellar (L3/4) 1 1    Achilles (S1)      Hoffman      Plantar     Jaw jerk    Sensation:  Light touch Decreased in LUE and LLE to touch.   Pin prick    Temperature    Vibration   Proprioception    Coordination/Complex Motor:  - Finger to Nose intact BL - Heel to shin intact BL - Rapid alternating movement are slowed throughout - Gait: deferred.  Labs   CBC: No results for input(s): WBC, NEUTROABS, HGB, HCT, MCV, PLT in the last 168 hours.  Basic Metabolic Panel:  Lab Results   Component Value Date   NA 140 07/04/2019   K 4.1 07/04/2019   CO2 18 (L) 07/04/2019   GLUCOSE 197 (H) 07/04/2019   BUN 21 07/04/2019   CREATININE 1.23 (H) 07/04/2019   CALCIUM 8.7 (L) 07/04/2019   GFRNONAA 41 (L) 07/04/2019   GFRAA 47 (L) 07/04/2019   Lipid Panel:  Lab Results  Component Value Date   LDLCALC 76 06/20/2019   HgbA1c:  Lab Results  Component Value Date   HGBA1C 8.6 (H) 06/18/2019   Urine Drug Screen:     Component Value Date/Time   LABOPIA NONE DETECTED 01/11/2016 1236   COCAINSCRNUR NONE DETECTED 01/11/2016 1236   LABBENZ NONE DETECTED 01/11/2016 1236   AMPHETMU NONE DETECTED 01/11/2016 1236   THCU NONE DETECTED 01/11/2016 1236   LABBARB NONE DETECTED 01/11/2016 1236    Alcohol Level     Component Value Date/Time   ETH <5 01/11/2016 0955    CT Head without contrast(Personally reviewed): CTH was negative for a large hypodensity concerning for a large territory infarct or hyperdensity concerning for an ICH  MRI Brain: pending  Impression   Sandra [name] is a 88 y.o. female with PMH significant for DM2, GERD, HTN, HLD, prior R occipital ICH possibly hypertensive versus amyloid angiopathy, anxiety, depression who presents with stumbling and lightheadedness. Her neurologic examination is notable for L hemianopsia which is chronic since her R occipital lobe 2021. She does have new L sensory deficit and given her prior strokes and microhemorrhages, I would recommend getting an MRI Brain without contrast for further evaluation.  Recommendations  - MRI Brain w/o contrast given concern for new L sensory deficit. No further workup if MRI Brain is negative for an acute stroke. - She does report malaise along with generalized weakness. I would recommend chemistry, CBC, COVID, flu and getting UA and CXR for further evaluation. _________________________________________________________________  Plan discussed with Dr. Gilford Raid with the ED team.   Thank you for  the opportunity to take part in the care of this patient. If you have any further questions, please contact the neurology consultation attending.  Signed,  [place] Pager Number [phone] _ _ _   _ __   _ __ _ _  __ __   _ __   __ _

## 2021-03-17 NOTE — ED Notes (Signed)
Called lab regarding adding the Troponin to previously collected. Per lab, they will add on the troponin.

## 2021-03-17 NOTE — H&P (Signed)
[place]   PATIENT NAME: Sandra [name]    MR#:  [id number]  DATE OF BIRTH:  1933-03-05  DATE OF ADMISSION:  03/17/2021  PRIMARY CARE PHYSICIAN: Nickola Major, MD   Patient is coming from: Home  REQUESTING/REFERRING PHYSICIAN: Wynona Dove, DO  CHIEF COMPLAINT:   Chief Complaint  Patient presents with   Altered Mental Status    HISTORY OF PRESENT ILLNESS:  Sandra [name] is a 88 y.o. Caucasian female with medical history significant for type 2 diabetes mellitus with diabetic retinopathy, essential hypertension, stage IIIb CKD, dyslipidemia, TIA, depression, coming with acute onset of left-sided weakness and numbness with associated agraphia and difficulty with ambulation with associated altered mental status and confusion.  She admitted to headache with her symptoms.  No chest pain or palpitations.  No tinnitus or vertigo.  No fever or chills.  She denies any cough or wheezing or hemoptysis.  No urinary or stool incontinence.  No dysuria, oliguria or hematuria or flank pain.  Her symptoms have been improving in the ER.  ED Course: Patient came to the ER BP was 140/85 and later 190/153 with otherwise normal vital signs.  Later BP was 167/58.  Labs revealed a potassium of 5.1 and a glucose of 299 with a BUN of 43 and creatinine 1.69 compared to 1.23 on 615/21.  High-sensitivity troponin I was 9.  CBC was within normal.  Influenza antigens and COVID-19 PCR came back negative.  UA showed total protein 150 glucose. EKG as reviewed by me : EKG showed likely sinus rhythm with a rate of 82. Imaging: Chest x-ray showed low lung volumes with left basilar atelectasis. Code stroke head CT revealed no acute infarction or intracranial hemorrhage.  The patient later had a brain MRI without contrast revealing no acute intracranial abnormality.  It showed old posterior right temporal infarction and findings of chronic microvascular disease.  The patient was seen by neurology in the ER and  was not thought to be a candidate for tPA.  The patient was given 1 mg of IV Ativan for MRI and 6 and 50 mg p.o. Tylenol as well as Lidoderm patch.  She will be admitted to an observation medical telemetry bed for further evaluation and management. PAST MEDICAL HISTORY:   Past Medical History:  Diagnosis Date   Abnormal blood finding 02/10/2016   Abrasion of ear canal 09/28/2012   Achilles tendon injury 11/27/2010   ACID REFLUX DISEASE 05/12/2006   Qualifier: Diagnosis of  By: Larose Kells MD, [place]    Acute MI [facility])    ALLERGIC RHINITIS 05/12/2006   Qualifier: Diagnosis of  By: Larose Kells MD, [place]    Anxiety state 07/03/2009   Qualifier: Diagnosis of  By: Larose Kells MD, [place] pain 07/01/2010   Chronic kidney disease (CKD), stage III (moderate) (HCC) 05/31/2014   Chronic right SI joint pain 09/22/2013   DEGENERATIVE JOINT DISEASE, CERVICAL SPINE 06/23/2006   Annotation: had a CAT scan with a cervical myelogram that show  left C6-7 and  C5-6 foraminal stenosis Qualifier: Diagnosis of  By: Larose Kells MD, [place] 05/12/2006   Qualifier: Diagnosis of  By: Larose Kells MD, [place], TYPE II 05/12/2006   Now following w/ Endo at [place] 04/27/2007   Qualifier: Diagnosis of  By: Jerold Coombe     Dyspnea 02/10/2016   Encephalopathy, hypertensive  Essential hypertension 05/12/2006   Qualifier: Diagnosis of  By: Larose Kells MD, [place]    Fatigue 05/31/2014   GAIT DISTURBANCE 08/07/2009   Qualifier: Diagnosis of  By: Larose Kells MD, [place].    GERD (gastroesophageal reflux disease)    Headache(784.0) 11/27/2010   Hyperlipidemia    Hyperlipidemia associated with type 2 diabetes mellitus ([place]) 05/12/2006   Qualifier: Diagnosis of  By: Larose Kells MD, [place]    ILD (interstitial lung disease) ([name]) 05/31/2015   Leukocytopenia 05/31/2014   Lower abdominal pain 05/17/2014   Nausea with vomiting 09/22/2013   NECK PAIN, CHRONIC 04/03/2008   Qualifier: Diagnosis of  By: Larose Kells MD, [place]    Nodule on  liver 12/06/2015   Osteoarthritis 02/10/2016   Osteopenia    Osteoporosis 06/23/2006   Annotation: had a bone density test in 08-2004.  T score was -2.4 Qualifier: Diagnosis of  By: Larose Kells MD, [place]a Berthold     [facility] (subarachnoid hemorrhage) ([place])    Sciatica    Scleroderma ([place]) 02/28/2016   Severe hypertension 03/18/2018   Slurred speech 11/27/2010   Takotsubo cardiomyopathy    s/p stress MI with stress induced CM with normal coronary arteries by cath 2007 with normalization of LVF by echo 04/2005   TIA (transient ischemic attack)    Unspecified cerebral artery occlusion with cerebral infarction 04/07/2012   UTI (urinary tract infection) 11/02/2011   Vasculitis of skin 09/28/2012   Vitamin D deficiency 10/01/2014    PAST SURGICAL HISTORY:   Past Surgical History:  Procedure Laterality Date   ABDOMINAL HYSTERECTOMY  1977   no oophorectomy   APPENDECTOMY     CARDIAC CATHETERIZATION     CATARACT EXTRACTION, BILATERAL  2011   SPINAL FUSION  2000   Dr. Vertell Limber   TONSILLECTOMY      SOCIAL HISTORY:   Social History   Tobacco Use   Smoking status: Never    Passive exposure: Yes   Smokeless tobacco: Never   Tobacco comments:    Mother & Children  Substance Use Topics   Alcohol use: Yes    Alcohol/week: 1.0 standard drink    Types: 1 Glasses of wine per week    Comment: occ    FAMILY HISTORY:   Family History  Problem Relation Age of Onset   Intracerebral hemorrhage Daughter        assoc with post-partum   Stroke Mother    Diabetes Sister    Asthma Sister    Diabetes Sister    Breast cancer Other        ?Aunt   Cancer Other        breast?   Coronary artery disease Neg Hx     DRUG ALLERGIES:   Allergies  Allergen Reactions   Cephalexin Nausea And Vomiting    Pt stated made severely sick, will never take again   Nintedanib Diarrhea   Pioglitazone Other (See Comments)    REACTION: EDEMA    REVIEW OF SYSTEMS:   ROS As per history of present illness. All pertinent systems  were reviewed above. Constitutional, HEENT, cardiovascular, respiratory, GI, GU, musculoskeletal, neuro, psychiatric, endocrine, integumentary and hematologic systems were reviewed and are otherwise negative/unremarkable except for positive findings mentioned above in the HPI.   MEDICATIONS AT HOME:   Prior to Admission medications   Medication Sig Start Date End Date Taking? Authorizing Provider  acetaminophen (TYLENOL) 325 MG tablet Take 1-2 tablets (325-650 mg total) by mouth every 4 (four) hours as needed for  mild pain. 07/05/19   Love, Ivan Anchors, PA-C  amLODipine (NORVASC) 10 MG tablet Take 10 mg by mouth daily.    [provider]  amLODipine (NORVASC) 5 MG tablet 5 mg daily. 01/26/20   [provider]  aspirin EC 81 MG tablet Take 1 tablet (81 mg total) by mouth daily. Swallow whole. 08/30/19   Frann Rider, NP  atorvastatin (LIPITOR) 40 MG tablet Take 1 tablet (40 mg total) by mouth daily. 07/06/19   Love, Ivan Anchors, PA-C  clonazePAM (KLONOPIN) 0.5 MG disintegrating tablet Take 1 tablet (0.5 mg total) by mouth at bedtime. 06/22/19   Donzetta Starch, NP  famotidine (PEPCID) 40 MG tablet Take 0.5 tablets (20 mg total) by mouth daily. 07/05/19 02/04/21  Love, Ivan Anchors, PA-C  glimepiride (AMARYL) 2 MG tablet Take 1 tablet (2 mg total) by mouth daily with breakfast. 07/06/19   Love, Ivan Anchors, PA-C  HYDROcodone-acetaminophen (NORCO/VICODIN) 5-325 MG tablet Take 1 tablet by mouth every 6 (six) hours as needed.    [provider]  insulin detemir (LEVEMIR) 100 UNIT/ML injection Inject 0.07 mLs (7 Units total) into the skin 2 (two) times daily. Patient taking differently: Inject 7 Units into the skin 2 (two) times daily. Now taking 8 units 07/05/19   Love, Ivan Anchors, PA-C  lisinopril (ZESTRIL) 40 MG tablet Take 1 tablet (40 mg total) by mouth daily. 07/05/19   Love, Ivan Anchors, PA-C  metoprolol succinate (TOPROL-XL) 50 MG 24 hr tablet Take 1 tablet (50 mg total) by mouth daily. Take with  or immediately following a meal. 07/05/19   Love, Ivan Anchors, PA-C  Multiple Vitamins-Minerals (CENTRUM ADULTS PO) Take by mouth.    [provider]  pantoprazole (PROTONIX) 40 MG tablet Take 1 tablet (40 mg total) by mouth daily. 07/06/19   Love, Ivan Anchors, PA-C  topiramate (TOPAMAX) 25 MG tablet Take 1 tablet (25 mg total) by mouth at bedtime. Patient not taking: Reported on 10/21/2020 11/20/19   Frann Rider, NP  vitamin B-12 (CYANOCOBALAMIN) 500 MCG tablet Take 1 tablet (500 mcg total) by mouth daily. 07/06/19   Love, Ivan Anchors, PA-C      VITAL SIGNS:  Blood pressure (!) 152/58, pulse 65, temperature 98 F (36.7 C), temperature source Oral, resp. rate 20, SpO2 95 %.  PHYSICAL EXAMINATION:  Physical Exam  GENERAL:  88 y.o.-year-old Caucasian female patient lying in the bed with no acute distress.  EYES: Pupils equal, round, reactive to light and accommodation. No scleral icterus. Extraocular muscles intact.  HEENT: Head atraumatic, normocephalic. Oropharynx and nasopharynx clear.  NECK:  Supple, no jugular venous distention. No thyroid enlargement, no tenderness.  LUNGS: Normal breath sounds bilaterally, no wheezing, rales,rhonchi or crepitation. No use of accessory muscles of respiration.  CARDIOVASCULAR: Regular rate and rhythm, S1, S2 normal. No murmurs, rubs, or gallops.  ABDOMEN: Soft, nondistended, nontender. Bowel sounds present. No organomegaly or mass.  EXTREMITIES: No pedal edema, cyanosis, or clubbing.  NEUROLOGIC: Cranial nerves II through XII are intact except for residual left hemianopsia from previous CVA.Marland Kitchen Muscle strength 5/5 in all extremities. Sensation was minimally diminished in the left upper and left lower extremity to light touch. Gait not checked.  PSYCHIATRIC: The patient is alert and oriented x 3.  Normal affect and good eye contact. SKIN: No obvious rash, lesion, or ulcer.   LABORATORY PANEL:   CBC Recent Labs  Lab 03/17/21 1700  WBC 8.3  HGB 11.8*   HCT 35.9*  PLT 201   ------------------------------------------------------------------------------------------------------------------  Chemistries  Recent Labs  Lab 03/17/21 1700  NA 135  K 5.1  CL 105  CO2 18*  GLUCOSE 299*  BUN 43*  CREATININE 1.69*  CALCIUM 9.3  AST 21  ALT 16  ALKPHOS 83  BILITOT 0.6   ------------------------------------------------------------------------------------------------------------------  Cardiac Enzymes No results for input(s): TROPONINI in the last 168 hours. ------------------------------------------------------------------------------------------------------------------  RADIOLOGY:  MR BRAIN WO CONTRAST  Result Date: 03/17/2021 CLINICAL DATA:  Acute neurologic deficit EXAM: MRI HEAD WITHOUT CONTRAST TECHNIQUE: Multiplanar, multiecho pulse sequences of the brain and surrounding structures were obtained without intravenous contrast. COMPARISON:  None. FINDINGS: Brain: No acute infarct, mass effect or extra-axial collection. Multifocal chronic hemorrhage greatest in posterior right temporal. Hyperintense T2-weighted signal is moderately widespread throughout the white matter. Generalized volume loss without a clear lobar predilection. 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 The midline structures are normal. Vascular: Major flow voids are preserved. Skull and upper cervical spine: Normal calvarium and skull base. Visualized upper cervical spine and soft tissues are normal. Sinuses/Orbits:No paranasal sinus fluid levels or advanced mucosal thickening. No mastoid or middle ear effusion. Normal orbits. IMPRESSION: 1. No acute intracranial abnormality. 2. 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 Electronically Signed   By: Ulyses Jarred M.D.   On: 03/17/2021 23:42   DG Chest Portable 1 View  Result Date: 03/17/2021 CLINICAL DATA:  Weakness EXAM: PORTABLE CHEST 1 VIEW COMPARISON:  06/23/2019 FINDINGS: Low lung  volumes. Heart is normal size. Right lung clear. Left base atelectasis. No effusions. No acute bony abnormality. Aortic atherosclerosis. IMPRESSION: Low lung volumes, left base atelectasis. Electronically Signed   By: Rolm Baptise M.D.   On: 03/17/2021 19:44   CT HEAD CODE STROKE WO CONTRAST  Result Date: 03/17/2021 CLINICAL DATA:  Code stroke.  Neuro deficit, acute, stroke suspected EXAM: CT HEAD WITHOUT CONTRAST TECHNIQUE: Contiguous axial images were obtained from the base of the skull through the vertex without intravenous contrast. RADIATION DOSE REDUCTION: This exam was performed according to the departmental dose-optimization program which includes automated exposure control, adjustment of the mA and/or kV according to patient size and/or use of iterative reconstruction technique. COMPARISON:  06/18/2019 FINDINGS: Brain: No acute intracranial hemorrhage, mass effect, or edema. No new loss of gray-white differentiation. There is encephalomalacia/gliosis in the right occipitotemporal lobes at the site of prior hemorrhage. Additional patchy and confluent areas of hypoattenuation in the supratentorial white matter are nonspecific but probably reflect moderate chronic microvascular ischemic changes. Prominence of the ventricles and sulci reflects parenchymal volume loss. There is no extra-axial collection. Vascular: No hyperdense vessel. Skull: Unremarkable. Sinuses/Orbits: Head no acute abnormality. Other: Mastoid air cells are clear. ASPECTS ([place] Stroke Program Early CT Score) - Ganglionic level infarction (caudate, lentiform nuclei, internal capsule, insula, M1-M3 cortex): 7 - Supraganglionic infarction (M4-M6 cortex): 3 Total score (0-10 with 10 being normal): 10 IMPRESSION: There is no acute intracranial hemorrhage or evidence of acute infarction. ASPECT score is 10. These results were communicated to Dr. Lorrin Goodell at 3:03 pm on 03/17/2021 by text page via the [facility] messaging system. Electronically  Signed   By: Macy Mis M.D.   On: 03/17/2021 15:05      IMPRESSION AND PLAN:  Assessment and Plan: * TIA (transient ischemic attack)- (present on admission) - The patient be admitted to an observation medical telemetry bed. - This is manifested by left sided weakness and numbness which have remarkably improved - We will follow neurochecks every 4 hours for 24 hours. - Will obtain  a 2D echo with bubble study and bilateral carotid Doppler. - The patient had a negative brain MRI for acute CVA as mentioned above. - We will place on aspirin and Plavix. - She will be on statin therapy and will check fasting lipids. - Neurology consult was obtained. - We will obtain PT/OT and ST consults.  Acute kidney injury superimposed on chronic kidney disease ([place])- (present on admission) - The patient be hydrated with IV normal saline and will follow BMP. - We will avoid nephrotoxins.  Essential hypertension- (present on admission) - We will continue antihypertensives with permissive parameters.  Depression - We will continue citalopram and Topamax.  Dyslipidemia - We will continue statin therapy and check fasting lipids.  Anxiety - We will continue Klonopin.  Type 2 diabetes mellitus with chronic kidney disease, with long-term current use of insulin ([place]) - The patient will be placed on supplement coverage with NovoLog. - We will continue her basal coverage.     DVT prophylaxis: Lovenox.  Advanced Care Planning:  Code Status: full code.  Family Communication:  The plan of care was discussed in details with the patient (and family). I answered all questions. The patient agreed to proceed with the above mentioned plan. Further management will depend upon hospital course. Disposition Plan: Back to previous home environment Consults called: Neurology. All the records are reviewed and case discussed with ED provider.  Status is: Observation  I certify that at the time of admission,  it is my clinical judgment that the patient will require  hospital care extending less than 2 midnights.                            Dispo: The patient is from: Home              Anticipated d/c is to: Home              Patient currently is not medically stable to d/c.              Difficult to place patient: No  Christel Mormon M.D on 03/18/2021 at 4:03 AM  [facility]   From 7 PM-7 AM, contact night-coverage [email or web address]  CC: Primary care physician; Nickola Major, MD

## 2021-03-17 NOTE — Code Documentation (Incomplete)
Stroke Response Nurse Documentation Code Documentation  Sandra [name] is a 88 y.o. female arriving to [facility]  via [facility] on 03/17/21 with past medical hx of HTN, CVA. Code stroke was activated by ED.   Patient from home with daughter where her daughter reports she suddenly became disoriented and unable to fill out her check book which is unlike her. She also noted to have left sided weakness. She was LKW at 1200.  Stroke team at the bedside on patient arrival. Labs drawn and patient cleared for CT by EDP. Patient to CT with team. NIHSS 2, see documentation for details and code stroke times. Patient with left decreased sensation on exam. The following imaging was completed:  CT Head. Patient is not a candidate for IV Thrombolytic due to past medical history of *** in 2021. Patient is not not a candidate for IR due to no suspected LVO.  Care Plan: Routine MRI and Q2 NIHSS.  Bedside handoff with ED RN Judson Roch.  Meda Klinefelter  Stroke Response RN

## 2021-03-18 ENCOUNTER — Observation Stay (HOSPITAL_BASED_OUTPATIENT_CLINIC_OR_DEPARTMENT_OTHER): Payer: Medicare HMO

## 2021-03-18 ENCOUNTER — Observation Stay (HOSPITAL_COMMUNITY): Payer: Medicare HMO

## 2021-03-18 DIAGNOSIS — F419 Anxiety disorder, unspecified: Secondary | ICD-10-CM

## 2021-03-18 DIAGNOSIS — G459 Transient cerebral ischemic attack, unspecified: Secondary | ICD-10-CM

## 2021-03-18 DIAGNOSIS — Z794 Long term (current) use of insulin: Secondary | ICD-10-CM

## 2021-03-18 DIAGNOSIS — E785 Hyperlipidemia, unspecified: Secondary | ICD-10-CM

## 2021-03-18 DIAGNOSIS — F32A Depression, unspecified: Secondary | ICD-10-CM

## 2021-03-18 DIAGNOSIS — E1122 Type 2 diabetes mellitus with diabetic chronic kidney disease: Secondary | ICD-10-CM

## 2021-03-18 DIAGNOSIS — N179 Acute kidney failure, unspecified: Secondary | ICD-10-CM | POA: Diagnosis present

## 2021-03-18 DIAGNOSIS — N189 Chronic kidney disease, unspecified: Secondary | ICD-10-CM | POA: Diagnosis not present

## 2021-03-18 LAB — HEMOGLOBIN A1C
Hgb A1c MFr Bld: 10 % — ABNORMAL HIGH (ref 4.8–5.6)
Mean Plasma Glucose: 240.3 mg/dL

## 2021-03-18 LAB — BASIC METABOLIC PANEL
Anion gap: 11 (ref 5–15)
BUN: 34 mg/dL — ABNORMAL HIGH (ref 8–23)
CO2: 19 mmol/L — ABNORMAL LOW (ref 22–32)
Calcium: 9.5 mg/dL (ref 8.9–10.3)
Chloride: 106 mmol/L (ref 98–111)
Creatinine, Ser: 1.56 mg/dL — ABNORMAL HIGH (ref 0.44–1.00)
GFR, Estimated: 32 mL/min — ABNORMAL LOW (ref >60)
Glucose, Bld: 203 mg/dL — ABNORMAL HIGH (ref 70–99)
Potassium: 4.5 mmol/L (ref 3.5–5.1)
Sodium: 136 mmol/L (ref 135–145)

## 2021-03-18 LAB — CBC
HCT: 38.4 % (ref 36.0–46.0)
Hemoglobin: 12.9 g/dL (ref 12.0–15.0)
MCH: 32.6 pg (ref 26.0–34.0)
MCHC: 33.6 g/dL (ref 30.0–36.0)
MCV: 97 fL (ref 80.0–100.0)
Platelets: 262 10*3/uL (ref 150–400)
RBC: 3.96 MIL/uL (ref 3.87–5.11)
RDW: 11.8 % (ref 11.5–15.5)
WBC: 8.9 10*3/uL (ref 4.0–10.5)
nRBC: 0 % (ref 0.0–0.2)

## 2021-03-18 LAB — CBG MONITORING, ED
Glucose-Capillary: 86 mg/dL (ref 70–99)
Glucose-Capillary: 195 mg/dL — ABNORMAL HIGH (ref 70–99)
Glucose-Capillary: 281 mg/dL — ABNORMAL HIGH (ref 70–99)

## 2021-03-18 LAB — LIPID PANEL
Cholesterol: 191 mg/dL (ref 0–200)
HDL: 35 mg/dL — ABNORMAL LOW (ref >40)
LDL Cholesterol: 102 mg/dL — ABNORMAL HIGH (ref 0–99)
Total CHOL/HDL Ratio: 5.5 RATIO
Triglycerides: 270 mg/dL — ABNORMAL HIGH (ref <150)
VLDL: 54 mg/dL — ABNORMAL HIGH (ref 0–40)

## 2021-03-18 LAB — GLUCOSE, CAPILLARY
Glucose-Capillary: 172 mg/dL — ABNORMAL HIGH (ref 70–99)
Glucose-Capillary: 155 mg/dL — ABNORMAL HIGH (ref 70–99)

## 2021-03-18 LAB — ECHOCARDIOGRAM COMPLETE BUBBLE STUDY
AR max vel: 1.63 cm2
AV Peak grad: 8.6 mmHg
Ao pk vel: 1.47 m/s
Area-P 1/2: 4.21 cm2
Calc EF: 63.5 %
S' Lateral: 2.9 cm
Single Plane A2C EF: 65.2 %
Single Plane A4C EF: 62.6 %

## 2021-03-18 MED ORDER — LISINOPRIL 20 MG PO TABS: 40.0000 mg | ORAL_TABLET | Freq: Every day | ORAL | Status: DC

## 2021-03-18 MED ORDER — ACETAMINOPHEN 325 MG PO TABS
650.0000 mg | ORAL_TABLET | ORAL | Status: DC | PRN
  Administered 2021-03-18: 650 mg via ORAL
  Filled 2021-03-18: qty 2

## 2021-03-18 MED ORDER — GLIMEPIRIDE 2 MG PO TABS
2.0000 mg | ORAL_TABLET | Freq: Every day | ORAL | Status: DC
  Administered 2021-03-19 – 2021-03-20 (×2): 2 mg via ORAL
  Filled 2021-03-18 (×2): qty 1

## 2021-03-18 MED ORDER — SODIUM CHLORIDE 0.9 % IV SOLN: INTRAVENOUS | Status: DC

## 2021-03-18 MED ORDER — ACETAMINOPHEN 650 MG RE SUPP: 650.0000 mg | RECTAL | Status: DC | PRN

## 2021-03-18 MED ORDER — ACETAMINOPHEN 160 MG/5ML PO SOLN: 650.0000 mg | ORAL | Status: DC | PRN

## 2021-03-18 MED ORDER — PANTOPRAZOLE SODIUM 40 MG PO TBEC
40.0000 mg | DELAYED_RELEASE_TABLET | Freq: Every day | ORAL | Status: DC
  Administered 2021-03-18 – 2021-03-20 (×3): 40 mg via ORAL
  Filled 2021-03-18 (×3): qty 1

## 2021-03-18 MED ORDER — METOPROLOL SUCCINATE ER 50 MG PO TB24
50.0000 mg | ORAL_TABLET | Freq: Every day | ORAL | Status: DC
  Administered 2021-03-18 – 2021-03-20 (×3): 50 mg via ORAL
  Filled 2021-03-18: qty 2
  Filled 2021-03-18 (×2): qty 1

## 2021-03-18 MED ORDER — INSULIN DETEMIR 100 UNIT/ML ~~LOC~~ SOLN
7.0000 [IU] | Freq: Two times a day (BID) | SUBCUTANEOUS | Status: DC
  Administered 2021-03-18 – 2021-03-20 (×5): 7 [IU] via SUBCUTANEOUS
  Filled 2021-03-18 (×7): qty 0.07

## 2021-03-18 MED ORDER — INSULIN ASPART 100 UNIT/ML IJ SOLN
0.0000 [IU] | Freq: Three times a day (TID) | INTRAMUSCULAR | Status: DC
  Administered 2021-03-18: 8 [IU] via SUBCUTANEOUS
  Administered 2021-03-18 (×3): 3 [IU] via SUBCUTANEOUS
  Administered 2021-03-19: 5 [IU] via SUBCUTANEOUS
  Administered 2021-03-19: 8 [IU] via SUBCUTANEOUS
  Administered 2021-03-19: 3 [IU] via SUBCUTANEOUS
  Administered 2021-03-20: 2 [IU] via SUBCUTANEOUS
  Administered 2021-03-20: 5 [IU] via SUBCUTANEOUS

## 2021-03-18 MED ORDER — FAMOTIDINE 20 MG PO TABS
20.0000 mg | ORAL_TABLET | Freq: Every day | ORAL | Status: DC
  Administered 2021-03-19 – 2021-03-20 (×2): 20 mg via ORAL
  Filled 2021-03-18 (×2): qty 1

## 2021-03-18 MED ORDER — STROKE: EARLY STAGES OF RECOVERY BOOK
Freq: Once | Status: DC
  Filled 2021-03-18 (×2): qty 1

## 2021-03-18 MED ORDER — VITAMIN B-12 100 MCG PO TABS
500.0000 ug | ORAL_TABLET | Freq: Every day | ORAL | Status: DC
  Administered 2021-03-18 – 2021-03-20 (×3): 500 ug via ORAL
  Filled 2021-03-18: qty 1
  Filled 2021-03-18 (×2): qty 5

## 2021-03-18 MED ORDER — CLOPIDOGREL BISULFATE 75 MG PO TABS
75.0000 mg | ORAL_TABLET | Freq: Every day | ORAL | Status: DC
  Administered 2021-03-18 – 2021-03-20 (×3): 75 mg via ORAL
  Filled 2021-03-18 (×3): qty 1

## 2021-03-18 MED ORDER — AMLODIPINE BESYLATE 5 MG PO TABS
5.0000 mg | ORAL_TABLET | Freq: Every day | ORAL | Status: DC
  Administered 2021-03-19: 5 mg via ORAL
  Filled 2021-03-18: qty 1

## 2021-03-18 MED ORDER — ASPIRIN EC 81 MG PO TBEC
81.0000 mg | DELAYED_RELEASE_TABLET | Freq: Every day | ORAL | Status: DC
  Administered 2021-03-18 – 2021-03-20 (×3): 81 mg via ORAL
  Filled 2021-03-18 (×3): qty 1

## 2021-03-18 MED ORDER — CLONAZEPAM 0.25 MG PO TBDP
0.5000 mg | ORAL_TABLET | Freq: Every day | ORAL | Status: DC
  Administered 2021-03-18 – 2021-03-19 (×2): 0.5 mg via ORAL
  Filled 2021-03-18 (×2): qty 2

## 2021-03-18 MED ORDER — ENOXAPARIN SODIUM 30 MG/0.3ML IJ SOSY
30.0000 mg | PREFILLED_SYRINGE | INTRAMUSCULAR | Status: DC
  Administered 2021-03-18 – 2021-03-20 (×3): 30 mg via SUBCUTANEOUS
  Filled 2021-03-18 (×3): qty 0.3

## 2021-03-18 MED ORDER — SENNOSIDES-DOCUSATE SODIUM 8.6-50 MG PO TABS: 1.0000 | ORAL_TABLET | Freq: Every evening | ORAL | Status: DC | PRN

## 2021-03-18 MED ORDER — ATORVASTATIN CALCIUM 40 MG PO TABS
40.0000 mg | ORAL_TABLET | Freq: Every day | ORAL | Status: DC
  Administered 2021-03-18 – 2021-03-20 (×3): 40 mg via ORAL
  Filled 2021-03-18 (×3): qty 1

## 2021-03-18 NOTE — Progress Notes (Signed)
Carotid duplex bilateral study completed.   Please see CV Proc for preliminary results.   Dorlene Footman, RDMS, RVT  

## 2021-03-18 NOTE — Progress Notes (Signed)
°  Progress Note   Patient: Sandra [name] ION:[id number] DOB: [birth year] DOA: 03/17/2021     0 DOS: the patient was seen and examined on 03/18/2021   Brief hospital course: 88 year old past medical history significant for type 2 diabetes, with diabetic retinopathy, hypertension, stage IIIb CKD, TIA, depression presented with acute onset generalized weakness, with associated difficulty with ambulation, altered mental status and confusion.  She reports headache.  Evaluation in the ED: Blood pressure 140/85--creatinine 1.6, previous creatinine 1.2, PCR COVID-negative.  Chest x-ray left basilar atelectasis. Code stroke was called, CT head negative for acute infarct.  MRI no acute intracranial abnormality.  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 Neurology was consulted for initial code stroke evaluation, patient was thought not to be a candidate for tPA.  Patient was found to have hyperkalemia potassium 7.7, hyperglycemia CBG 369, AKI with a creatinine 1.8  Assessment and Plan: * Acute kidney injury superimposed on chronic kidney disease ([place])- (present on admission) Avoid nephrotoxins. AKI on CKD stage III B.  Cr peak to  1.8. Continue with IV fluids.   General weakness In setting of AKI on CKD , hyperglycemia  Continue with IV fluids, correct hyperglycemia.  PT/OT eval.   Depression Continue citalopram and Topamax.  Dyslipidemia Continue statin therapy . LDL 102  Anxiety - We will continue Klonopin.  Type 2 diabetes mellitus with chronic kidney disease, with long-term current use of insulin (HCC) Continue with levemir, SSI , Glimepiride.  DM with hyperglycemia, uncontrolled.  A 1c; 10, needs better CBG controlled.   TIA (transient ischemic attack)- (present on admission) Low suspicious for TIA per neurology.  Generalized weakness likely metabolic.  ECHO; N EF, Grade one diastolic dysfunction.  Carotid Doppler; BL ICA 39 % stenosis.     Essential hypertension- (present on admission) Continue antihypertensives.  MRI negative for stroke.  Hold Lisinopril due to AKI        Subjective: she is feeling ok, denies diarrhea, vomiting. She presented with generalized weakness and headaches.    Physical Exam: Vitals:   03/18/21 1145 03/18/21 1315 03/18/21 1333 03/18/21 1638  BP: (!) 175/58 (!) 151/127  (!) 150/60  Pulse: (!) 57 80  66  Resp:  (!) 24    Temp:   98.4 F (36.9 C) 98.1 F (36.7 C)  TempSrc:   Oral Oral  SpO2: 96% 94%  98%   General; NAD CVS; S 1, S 2 RRR Lungs; CTA  Data Reviewed:  MRI, CBC, Bmet  Family Communication: Care discussed with patient.   Disposition: Status is: Observation  The patient remains OBS appropriate and will d/c before 2 midnights.    Planned Discharge Destination: Home when renal function improved     Time spent: 45 minutes  Author: Elmarie Shiley, MD 03/18/2021 6:35 PM  For on call review [email or web address].

## 2021-03-18 NOTE — Assessment & Plan Note (Addendum)
Low suspicious for TIA per neurology.Generalized weakness likely metabolic. ECHO; N EF, Grade one diastolic dysfunction. carotid Doppler; BL ICA 39 % stenosis.  Resume home meds

## 2021-03-18 NOTE — Progress Notes (Signed)
Brief Neuro Update:  Reviewed MRI Brain with no acute stroke. My suspicion for her having a new stroke or TIA is low. Her exam matches with what have been documented in neurology office progress notes.  I suspect that her generalized weakness is more likely metabolic in nature. Would recommend treatment of AKI on CKD per primary team.  We are going to signoff. Please feel free to contact us with any questions or concerns.   [place] Pager Number [phone]

## 2021-03-18 NOTE — Evaluation (Signed)
Physical Therapy Evaluation Patient Details Name: Sandra [name] MRN: [id number] DOB: 11/03/1933 Today's Date: 03/18/2021  History of Present Illness  Pt is a 88 y/o female presenting with AMS, L sided weakness. CT/MRI negative. Found with AKI. PMH includes: DM2, diabetic retinopathy, essential HTN, CKD, TIA, depression.  Clinical Impression  Pt admitted secondary to problem above with deficits below. Pt requiring min guard A for mobility tasks using RW. Required some safety cues as pt ran into objects occasionally. Educated about using RW at home for increased safety. Recommending HHPT at d/c to address current deficits. Will continue to follow acutely.        Recommendations for follow up therapy are one component of a multi-disciplinary discharge planning process, led by the attending physician.  Recommendations may be updated based on patient status, additional functional criteria and insurance authorization.  Follow Up Recommendations Home health PT    Assistance Recommended at Discharge Frequent or constant Supervision/Assistance  Patient can return home with the following  A little help with walking and/or transfers;A little help with bathing/dressing/bathroom;Help with stairs or ramp for entrance;Assist for transportation;Direct supervision/assist for financial management;Direct supervision/assist for medications management;Assistance with cooking/housework    Equipment Recommendations None recommended by PT  Recommendations for Other Services       Functional Status Assessment Patient has had a recent decline in their functional status and demonstrates the ability to make significant improvements in function in a reasonable and predictable amount of time.     Precautions / Restrictions Precautions Precautions: Fall Restrictions Weight Bearing Restrictions: No      Mobility  Bed Mobility Overal bed mobility: Needs Assistance Bed Mobility: Sit to Supine, Supine to Sit      Supine to sit: Min guard Sit to supine: Min guard   General bed mobility comments: Min guard for safety.    Transfers Overall transfer level: Needs assistance Equipment used: Rolling walker (2 wheels) Transfers: Sit to/from Stand Sit to Stand: Min guard           General transfer comment: Min guard for safety. Mild shakiness noted    Ambulation/Gait Ambulation/Gait assistance: Min guard Gait Distance (Feet): 75 Feet Assistive device: Rolling walker (2 wheels) Gait Pattern/deviations: Step-through pattern, Decreased stride length Gait velocity: Decreased     General Gait Details: Cues for safe use of RW as pt would run into objects occasionally. Min guard for safety.  Stairs            Wheelchair Mobility    Modified Rankin (Stroke Patients Only)       Balance Overall balance assessment: Needs assistance Sitting-balance support: No upper extremity supported, Feet supported Sitting balance-Leahy Scale: Fair     Standing balance support: During functional activity, Bilateral upper extremity supported Standing balance-Leahy Scale: Poor Standing balance comment: Reliant on BUE support                             Pertinent Vitals/Pain      Home Living Family/patient expects to be discharged to:: Private residence Living Arrangements: Children (daughter) Available Help at Discharge: Family;Available PRN/intermittently Type of Home: House Home Access: Stairs to enter Entrance Stairs-Rails: None Entrance Stairs-Number of Steps: 1   Home Layout: One level Home Equipment: Conservation officer, nature (2 wheels);Cane - single point;Shower seat Additional Comments: daughter works evenings    Prior Function Prior Level of Function : Independent/Modified Independent;Driving  ADLs Comments: driving, cooking/cleaning, manages fiances. Grandson put meds in pillbox for pt     Hand Dominance   Dominant Hand: Right    Extremity/Trunk  Assessment   Upper Extremity Assessment Upper Extremity Assessment: Defer to OT evaluation    Lower Extremity Assessment Lower Extremity Assessment: Generalized weakness       Communication   Communication: No difficulties  Cognition Arousal/Alertness: Awake/alert Behavior During Therapy: WFL for tasks assessed/performed Overall Cognitive Status: No family/caregiver present to determine baseline cognitive functioning Area of Impairment: Memory, Following commands, Problem solving                     Memory: Decreased short-term memory Following Commands: Follows multi-step commands inconsistently     Problem Solving: Slow processing, Requires verbal cues General Comments: pt oriented and pleasant.  Repeats questions during session, difficulty following mulitple step commands. Decreased safety awareness noted as well        General Comments      Exercises     Assessment/Plan    PT Assessment Patient needs continued PT services  PT Problem List Decreased strength;Decreased activity tolerance;Decreased balance;Decreased mobility;Decreased cognition;Decreased safety awareness;Decreased knowledge of use of DME       PT Treatment Interventions Stair training;DME instruction;Gait training;Functional mobility training;Therapeutic activities;Therapeutic exercise;Balance training;Patient/family education    PT Goals (Current goals can be found in the Care Plan section)  Acute Rehab PT Goals Patient Stated Goal: to go home PT Goal Formulation: With patient Time For Goal Achievement: 04/01/21 Potential to Achieve Goals: Good    Frequency Min 3X/week     Co-evaluation               AM-PAC PT "6 Clicks" Mobility  Outcome Measure Help needed turning from your back to your side while in a flat bed without using bedrails?: None Help needed moving from lying on your back to sitting on the side of a flat bed without using bedrails?: A Little Help needed moving to  and from a bed to a chair (including a wheelchair)?: A Little Help needed standing up from a chair using your arms (e.g., wheelchair or bedside chair)?: A Little Help needed to walk in hospital room?: A Little Help needed climbing 3-5 steps with a railing? : A Little 6 Click Score: 19    End of Session Equipment Utilized During Treatment: Gait belt Activity Tolerance: Patient tolerated treatment well Patient left: in bed;with call bell/phone within reach (on stretcher in ED) Nurse Communication: Mobility status PT Visit Diagnosis: Unsteadiness on feet (R26.81);Muscle weakness (generalized) (M62.81);Other abnormalities of gait and mobility (R26.89)    Time: 7867-5449 PT Time Calculation (min) (ACUTE ONLY): 16 min   Charges:   PT Evaluation $PT Eval Low Complexity: 1 Low          Lou Miner, DPT  Acute Rehabilitation Services  Pager: [phone] Office: [phone]   Rudean Hitt 03/18/2021, 4:45 PM

## 2021-03-18 NOTE — Assessment & Plan Note (Addendum)
BPH stable amlodipine increased to 10 mg on metoprolol, lisinopril has been discontinued.  Follow-up with PCP.

## 2021-03-18 NOTE — Assessment & Plan Note (Addendum)
Mood stable, continue citalopram and Topamax.

## 2021-03-18 NOTE — Evaluation (Signed)
Occupational Therapy Evaluation Patient Details Name: Sandra [name] MRN: [id number] DOB: Mar 30, 1933 Today's Date: 03/18/2021   History of Present Illness Pt is a 88 y/o female presenting with AMS, L sided weakness. CT/MRI negative. Found with AKI. PMH includes: DM2, diabetic retinopathy, essential HTN, CKD, TIA, depression.   Clinical Impression   Pt reports PTA independent and driving (only has assist for med mgmt from grandson). She was admitted for above and limited by problem list below, including generalized weakness, impaired balance and decreased activity tolerance.  She is pleasant, follows simple commands, but demonstrates decreased safety, difficulty following multiple step commands, decreased problem solving, and poor recall.  Pt currently requires min assist for transfers and limited mobility in room, up to min assist for ADLs.  Educated on safety and recommendations to use RW, as pt reaching out for UE support during mobility. Patient has support of daughter at home during the day (daughter works evenings).  Believe she will benefit from continued OT services acutely and after dc at [facility] level to optimize independence, safety and return to PLOF.      Recommendations for follow up therapy are one component of a multi-disciplinary discharge planning process, led by the attending physician.  Recommendations may be updated based on patient status, additional functional criteria and insurance authorization.   Follow Up Recommendations  Home health OT    Assistance Recommended at Discharge Frequent or constant Supervision/Assistance  Patient can return home with the following A little help with walking and/or transfers;A little help with bathing/dressing/bathroom;Assistance with cooking/housework;Direct supervision/assist for medications management;Assist for transportation;Help with stairs or ramp for entrance    Functional Status Assessment  Patient has had a recent decline in their  functional status and demonstrates the ability to make significant improvements in function in a reasonable and predictable amount of time.  Equipment Recommendations  None recommended by OT    Recommendations for Other Services PT consult     Precautions / Restrictions Precautions Precautions: Fall Restrictions Weight Bearing Restrictions: No      Mobility Bed Mobility Overal bed mobility: Needs Assistance Bed Mobility: Sit to Supine, Supine to Sit     Supine to sit: Mod assist Sit to supine: Min assist   General bed mobility comments: mod assist to ascend trunk from stretcher, min assist to position back on to stetcher in ED    Transfers                          Balance Overall balance assessment: Needs assistance Sitting-balance support: No upper extremity supported, Feet supported Sitting balance-Leahy Scale: Fair     Standing balance support: No upper extremity supported, During functional activity, Single extremity supported Standing balance-Leahy Scale: Poor Standing balance comment: relies on external and at least 1 UE support                           ADL either performed or assessed with clinical judgement   ADL Overall ADL's : Needs assistance/impaired Eating/Feeding: Set up;Sitting   Grooming: Set up;Sitting           Upper Body Dressing : Min guard;Sitting   Lower Body Dressing: Minimal assistance;Sit to/from stand   Toilet Transfer: Minimal assistance;Ambulation Toilet Transfer Details (indicate cue type and reason): simulated in room         Functional mobility during ADLs: Minimal assistance;Cueing for safety General ADL Comments: pt limited by weakness, impaired balance; completed  mobility in room and requires UE support     Vision   Vision Assessment?: No apparent visual deficits     Perception     Praxis      Pertinent Vitals/Pain Pain Assessment Pain Assessment: No/denies pain     Hand Dominance  Right   Extremity/Trunk Assessment Upper Extremity Assessment Upper Extremity Assessment: Generalized weakness   Lower Extremity Assessment Lower Extremity Assessment: Defer to PT evaluation       Communication Communication Communication: No difficulties   Cognition Arousal/Alertness: Awake/alert Behavior During Therapy: WFL for tasks assessed/performed Overall Cognitive Status: No family/caregiver present to determine baseline cognitive functioning Area of Impairment: Memory, Following commands                     Memory: Decreased short-term memory Following Commands: Follows multi-step commands inconsistently       General Comments: pt oriented and pleasant.  Repeats questions during session, difficulty following mulitple step commands- continue assessment     General Comments  BP elevated, RN provided meds prior to session    Exercises     Shoulder Instructions      Home Living Family/patient expects to be discharged to:: Private residence Living Arrangements: Children (daughter) Available Help at Discharge: Family;Available PRN/intermittently Type of Home: House Home Access: Stairs to enter Entrance Stairs-Number of Steps: 1 Entrance Stairs-Rails: None Home Layout: One level     Bathroom Shower/Tub: Teacher, early years/pre: Standard     Home Equipment: Conservation officer, nature (2 wheels);Cane - single point;Shower seat   Additional Comments: daughter works evenings      Prior Functioning/Environment Prior Level of Function : Independent/Modified Independent;Driving               ADLs Comments: driving, cooking/cleaning, manages fiances. Grandson put meds in pillbox for pt        OT Problem List: Decreased strength;Decreased activity tolerance;Impaired balance (sitting and/or standing);Decreased safety awareness;Decreased knowledge of use of DME or AE      OT Treatment/Interventions: Self-care/ADL training;Therapeutic exercise;DME  and/or AE instruction;Therapeutic activities;Balance training;Patient/family education    OT Goals(Current goals can be found in the care plan section) Acute Rehab OT Goals Patient Stated Goal: get home OT Goal Formulation: With patient Time For Goal Achievement: 04/01/21 Potential to Achieve Goals: Good  OT Frequency: Min 2X/week    Co-evaluation              AM-PAC OT "6 Clicks" Daily Activity     Outcome Measure Help from another person eating meals?: A Little Help from another person taking care of personal grooming?: A Little Help from another person toileting, which includes using toliet, bedpan, or urinal?: A Little Help from another person bathing (including washing, rinsing, drying)?: A Little Help from another person to put on and taking off regular upper body clothing?: A Little Help from another person to put on and taking off regular lower body clothing?: A Little 6 Click Score: 18   End of Session Nurse Communication: Mobility status  Activity Tolerance: Patient tolerated treatment well Patient left: in bed;with call bell/phone within reach  OT Visit Diagnosis: Muscle weakness (generalized) (M62.81);Unsteadiness on feet (R26.81)                Time: 5409-8119 OT Time Calculation (min): 25 min Charges:  OT General Charges $OT Visit: 1 Visit OT Evaluation $OT Eval Moderate Complexity: 1 Mod OT Treatments $Self Care/Home Management : 8-22 mins  Jolaine Artist, OT Acute Rehabilitation  Services Pager [phone] Office [place] 03/18/2021, 9:22 AM

## 2021-03-18 NOTE — Assessment & Plan Note (Addendum)
LDL 102, continue home statin

## 2021-03-18 NOTE — ED Notes (Signed)
Pt at bedside

## 2021-03-18 NOTE — Assessment & Plan Note (Addendum)
Deconditioning, PT OT Home health has been planned.

## 2021-03-18 NOTE — Hospital Course (Addendum)
88 year old past medical history significant for type 2 diabetes, with diabetic retinopathy, hypertension, stage IIIb CKD, TIA, depression presented with acute onset generalized weakness, with associated difficulty with ambulation, altered mental status and confusion.  She reports headache. Evaluation in the ED: Blood pressure 140/85--creatinine 1.6, previous creatinine 1.2, PCR COVID-negative.  Chest x-ray left basilar atelectasis. Code stroke was called, CT head negative for acute infarct.  MRI no acute intracranial abnormality.  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 Neurology was consulted for initial code stroke evaluation, patient was thought not to be a candidate for tPA. Patient was found to have hyperkalemia potassium 7.7, hyperglycemia CBG 369, AKI with a creatinine 1.8. Patient creatinine has improved to baseline at 1.5 with IV fluid hydration.  Ultrasound showed bilateral renal cortical thinning.  Advised BMP check in a week.  Patient was treated for generalized weakness deconditioning seen by PT OT home health has been recommended.  Patient with anxiety depression dyslipidemia type 2 diabetes with A1c at 10 more or less stable she will continue her current home meds.  Patient was seen by neurology due to concern for TIA but at this time low suspicion for TIA per neurology likely generalized debility metabolic echo normal EF carotid Dopplers bilateral ICA current persistent disease MRI negative for acute stroke. She is medically stable for discharge.  Patient had some dysuria/questionable spasm of the bladder this morning but no recurrence, bladder scan no retention and UA unremarkable for UTI.  Continue Bentyl as needed.

## 2021-03-18 NOTE — Assessment & Plan Note (Addendum)
Stable, continue Klonopin.

## 2021-03-18 NOTE — Assessment & Plan Note (Addendum)
Blood sugar is fairly  controlled patient to continue home regimen A1c is at 10.   Recent Labs  Lab 03/18/21 0926 03/18/21 1247 03/19/21 1228 03/19/21 1636 03/19/21 2122 03/20/21 0626 03/20/21 1154  GLUCAP  --    < > 262* 152* 221* 131* 203*  HGBA1C 10.0*  --   --   --   --   --   --    < > = values in this interval not displayed.

## 2021-03-18 NOTE — Assessment & Plan Note (Addendum)
Baseline creatinine around 1.59 improved to baseline value with IV fluid hydration, stop IV fluids, work-up unremarkable for hydronephrosis with ultrasound.  No evidence of UTI.  Tolerating diet.

## 2021-03-19 ENCOUNTER — Observation Stay (HOSPITAL_COMMUNITY): Payer: Medicare HMO

## 2021-03-19 ENCOUNTER — Encounter (HOSPITAL_COMMUNITY): Payer: Self-pay | Admitting: Family Medicine

## 2021-03-19 DIAGNOSIS — E1169 Type 2 diabetes mellitus with other specified complication: Secondary | ICD-10-CM | POA: Diagnosis present

## 2021-03-19 DIAGNOSIS — I2510 Atherosclerotic heart disease of native coronary artery without angina pectoris: Secondary | ICD-10-CM | POA: Diagnosis present

## 2021-03-19 DIAGNOSIS — Z20822 Contact with and (suspected) exposure to covid-19: Secondary | ICD-10-CM | POA: Diagnosis present

## 2021-03-19 DIAGNOSIS — Z825 Family history of asthma and other chronic lower respiratory diseases: Secondary | ICD-10-CM | POA: Diagnosis not present

## 2021-03-19 DIAGNOSIS — Z8673 Personal history of transient ischemic attack (TIA), and cerebral infarction without residual deficits: Secondary | ICD-10-CM | POA: Diagnosis not present

## 2021-03-19 DIAGNOSIS — Z794 Long term (current) use of insulin: Secondary | ICD-10-CM | POA: Diagnosis not present

## 2021-03-19 DIAGNOSIS — I129 Hypertensive chronic kidney disease with stage 1 through stage 4 chronic kidney disease, or unspecified chronic kidney disease: Secondary | ICD-10-CM | POA: Diagnosis present

## 2021-03-19 DIAGNOSIS — J9811 Atelectasis: Secondary | ICD-10-CM | POA: Diagnosis present

## 2021-03-19 DIAGNOSIS — Z981 Arthrodesis status: Secondary | ICD-10-CM | POA: Diagnosis not present

## 2021-03-19 DIAGNOSIS — E785 Hyperlipidemia, unspecified: Secondary | ICD-10-CM | POA: Diagnosis present

## 2021-03-19 DIAGNOSIS — I6523 Occlusion and stenosis of bilateral carotid arteries: Secondary | ICD-10-CM | POA: Diagnosis present

## 2021-03-19 DIAGNOSIS — N179 Acute kidney failure, unspecified: Secondary | ICD-10-CM | POA: Diagnosis present

## 2021-03-19 DIAGNOSIS — R410 Disorientation, unspecified: Secondary | ICD-10-CM | POA: Diagnosis present

## 2021-03-19 DIAGNOSIS — Z823 Family history of stroke: Secondary | ICD-10-CM | POA: Diagnosis not present

## 2021-03-19 DIAGNOSIS — N289 Disorder of kidney and ureter, unspecified: Secondary | ICD-10-CM | POA: Diagnosis not present

## 2021-03-19 DIAGNOSIS — N281 Cyst of kidney, acquired: Secondary | ICD-10-CM | POA: Diagnosis not present

## 2021-03-19 DIAGNOSIS — N1832 Chronic kidney disease, stage 3b: Secondary | ICD-10-CM | POA: Diagnosis present

## 2021-03-19 DIAGNOSIS — I5181 Takotsubo syndrome: Secondary | ICD-10-CM | POA: Diagnosis present

## 2021-03-19 DIAGNOSIS — E875 Hyperkalemia: Secondary | ICD-10-CM | POA: Diagnosis present

## 2021-03-19 DIAGNOSIS — E8889 Other specified metabolic disorders: Secondary | ICD-10-CM | POA: Diagnosis present

## 2021-03-19 DIAGNOSIS — E1165 Type 2 diabetes mellitus with hyperglycemia: Secondary | ICD-10-CM | POA: Diagnosis present

## 2021-03-19 DIAGNOSIS — F411 Generalized anxiety disorder: Secondary | ICD-10-CM | POA: Diagnosis present

## 2021-03-19 DIAGNOSIS — F32A Depression, unspecified: Secondary | ICD-10-CM | POA: Diagnosis present

## 2021-03-19 DIAGNOSIS — N189 Chronic kidney disease, unspecified: Secondary | ICD-10-CM | POA: Diagnosis not present

## 2021-03-19 DIAGNOSIS — E11319 Type 2 diabetes mellitus with unspecified diabetic retinopathy without macular edema: Secondary | ICD-10-CM | POA: Diagnosis present

## 2021-03-19 DIAGNOSIS — M810 Age-related osteoporosis without current pathological fracture: Secondary | ICD-10-CM | POA: Diagnosis present

## 2021-03-19 DIAGNOSIS — Z9841 Cataract extraction status, right eye: Secondary | ICD-10-CM | POA: Diagnosis not present

## 2021-03-19 DIAGNOSIS — Z833 Family history of diabetes mellitus: Secondary | ICD-10-CM | POA: Diagnosis not present

## 2021-03-19 LAB — BASIC METABOLIC PANEL
Anion gap: 8 (ref 5–15)
BUN: 38 mg/dL — ABNORMAL HIGH (ref 8–23)
CO2: 20 mmol/L — ABNORMAL LOW (ref 22–32)
Calcium: 8.9 mg/dL (ref 8.9–10.3)
Chloride: 110 mmol/L (ref 98–111)
Creatinine, Ser: 1.7 mg/dL — ABNORMAL HIGH (ref 0.44–1.00)
GFR, Estimated: 29 mL/min — ABNORMAL LOW (ref >60)
Glucose, Bld: 110 mg/dL — ABNORMAL HIGH (ref 70–99)
Potassium: 4.3 mmol/L (ref 3.5–5.1)
Sodium: 138 mmol/L (ref 135–145)

## 2021-03-19 LAB — GLUCOSE, CAPILLARY
Glucose-Capillary: 118 mg/dL — ABNORMAL HIGH (ref 70–99)
Glucose-Capillary: 262 mg/dL — ABNORMAL HIGH (ref 70–99)
Glucose-Capillary: 152 mg/dL — ABNORMAL HIGH (ref 70–99)
Glucose-Capillary: 221 mg/dL — ABNORMAL HIGH (ref 70–99)

## 2021-03-19 MED ORDER — AMLODIPINE BESYLATE 10 MG PO TABS
10.0000 mg | ORAL_TABLET | Freq: Every day | ORAL | Status: DC
  Administered 2021-03-20: 10 mg via ORAL
  Filled 2021-03-19: qty 1

## 2021-03-19 MED ORDER — AMLODIPINE BESYLATE 5 MG PO TABS
5.0000 mg | ORAL_TABLET | Freq: Once | ORAL | Status: AC
  Administered 2021-03-19: 5 mg via ORAL
  Filled 2021-03-19: qty 1

## 2021-03-19 NOTE — Progress Notes (Signed)
?  Progress Note ? ? ?Patient: Sandra [name] DYJ:[id number] DOB: September 16, 1933 DOA: 03/17/2021     0 ?DOS: the patient was seen and examined on 03/19/2021 ?  ?Brief hospital course: ?88 year old past medical history significant for type 2 diabetes, with diabetic retinopathy, hypertension, stage IIIb CKD, TIA, depression presented with acute onset generalized weakness, with associated difficulty with ambulation, altered mental status and confusion.  She reports headache. ? ?Evaluation in the ED: Blood pressure 140/85--creatinine 1.6, previous creatinine 1.2, PCR COVID-negative.  Chest x-ray left basilar atelectasis. ?Code stroke was called, CT head negative for acute infarct.  MRI no acute intracranial abnormality.  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 ?Neurology was consulted for initial code stroke evaluation, patient was thought not to be a candidate for tPA. ? ?Patient was found to have hyperkalemia potassium 7.7, hyperglycemia CBG 369, AKI with a creatinine 1.8. ? ?Assessment and Plan: ?* Acute kidney injury superimposed on chronic kidney disease ([place])- (present on admission) ?Avoid nephrotoxins. Prior Cr level: 1.5 ?AKI on CKD stage III B.  ?Cr peak to  1.8. ?Continue with IV fluids. ?Cr increase today to 1.6, plan to keep IV fluids for another 24 hours.  ?Renal US;  Bilateral renal cortical thinning. Bilateral peripelvic fluid. Nonvisualization of the ureteral jets, although urinary bladder is normally distended. 1 cm benign-appearing left renal parapelvic cyst. ?Repeat labs tomorrow.  ?Discussed with nursing staff importance of documenting, monitoring urine out put.  ? ?General weakness ?In setting of AKI on CKD , hyperglycemia  ?Continue with IV fluids, correct hyperglycemia.  ?PT/OT eval. HH ordered.  ? ?Depression ?Continue citalopram and Topamax. ? ?Dyslipidemia ?Continue statin therapy . ?LDL 102 ? ?Anxiety ?Continue Klonopin. ? ?Type 2 diabetes mellitus with  chronic kidney disease, with long-term current use of insulin ([place]) ?Continue with levemir, SSI , Glimepiride.  ?DM with hyperglycemia, uncontrolled.  ?A 1c; 10, needs better CBG controlled.  ? ?TIA (transient ischemic attack)- (present on admission) ?Low suspicious for TIA per neurology.  ?Generalized weakness likely metabolic.  ?ECHO; N EF, Grade one diastolic dysfunction.  ?Carotid Doppler; BL ICA 39 % stenosis.  ? ? ?Essential hypertension- (present on admission) ?Continue antihypertensives.  ?MRI negative for stroke.  ?Hold Lisinopril due to AKI ?Increase Norvasc to 10 mg. Continue with metoprolol.  ? ? ? ? ? ? ? ?Subjective:  ?She is feeling better, she has been making urine. She wants to go home  ? ?Physical Exam: ?Vitals:  ? 03/18/21 1333 03/18/21 1638 03/18/21 2025 03/19/21 0339  ?BP:  (!) 150/60 (!) 151/57 (!) 185/65  ?Pulse:  66 67 84  ?Resp:   20 18  ?Temp: 98.4 ?F (36.9 ?C) 98.1 ?F (36.7 ?C) 98 ?F (36.7 ?C) 97.9 ?F (36.6 ?C)  ?TempSrc: Oral Oral Oral Oral  ?SpO2:  98% 96% 100%  ? ?General; NAD ?CVS; S 1 , S 2  RRR ?Lung; CTA ? ?Data Reviewed: ? ?Cbc, bmet.  ? ?Family Communication: Daughter over phone 3/01 ? ?Disposition: ?Status is: Inpatient ? ?Remains inpatient appropriate because: needs IV fluids, for AKI ? ? ? Planned Discharge Destination: Home ? ? ? ? ?Time spent: 45 minutes ? ?Author: ?Elmarie Shiley, MD ?03/19/2021 3:08 PM ? ?For on call review [email or web address].  ? ?

## 2021-03-19 NOTE — Evaluation (Cosign Needed)
Speech Language Pathology Evaluation Patient Details Name: Sandra [name] MRN: [id number] DOB: 08-Apr-1933 Today's Date: 03/19/2021 Time: 1100-1120 SLP Time Calculation (min) (ACUTE ONLY): 20 min  Problem List:  Patient Active Problem List   Diagnosis Date Noted   Acute kidney injury superimposed on chronic kidney disease (Sandra [name]) 03/18/2021   Type 2 diabetes mellitus with chronic kidney disease, with long-term current use of insulin (Sandra [name]) 03/18/2021   Anxiety 03/18/2021   Dyslipidemia 03/18/2021   Depression 03/18/2021   Left-sided weakness 03/17/2021   Uncontrolled type 2 diabetes mellitus with hyperglycemia (Sandra [name])    Benign essential HTN    Hypoalbuminemia due to protein-calorie malnutrition (Sandra [name])    Macrocytosis    Acute blood loss anemia    Diabetic retinopathy of left eye associated with type 2 diabetes mellitus (Sandra [name])    Cognitive impairment 06/22/2019   Lobar cerebral hemorrhage (Sandra [name]) 06/22/2019   ICH (intracerebral hemorrhage) (Sandra [name]) - R occipital - HTN vs CAA 06/18/2019   TIA (transient ischemic attack) 04/26/2019   Diarrhea 04/24/2019   Severe hypertension 03/18/2018   Encephalopathy, hypertensive    Acute encephalopathy 03/17/2018   Scleroderma (Sandra [name]) 02/28/2016   Dyspnea 02/10/2016   Abnormal blood finding 02/10/2016   CAD (coronary artery disease) 02/10/2016   Osteoarthritis 02/10/2016   Nodule on liver 12/06/2015   ILD (interstitial lung disease) (Sandra [name]) 05/31/2015   Type 2 diabetes mellitus with hyperglycemia (Sandra [name]) 03/11/2015   Diabetes mellitus type 2 with retinopathy (Sandra [name]) 03/04/2015   Vitamin D deficiency 10/01/2014   Leukocytopenia 05/31/2014   Chronic kidney disease (CKD), stage III (moderate) (Sandra [name]) 05/31/2014   General weakness 05/31/2014   Lower abdominal pain 05/17/2014   Nausea with vomiting 09/22/2013   Chronic right SI joint pain 09/22/2013   Vasculitis of skin 09/28/2012   Abrasion of ear canal 09/28/2012   History of subarachnoid hemorrhage 04/07/2012    Unspecified cerebral artery occlusion with cerebral infarction 04/07/2012   UTI (urinary tract infection) 11/02/2011   General medical examination 01/07/2011   Chest pain 11/27/2010   Achilles tendon injury 11/27/2010   Back pain 07/01/2010   Breast pain 04/15/2010   GAIT DISTURBANCE 08/07/2009   Anxiety state 07/03/2009   VALVULAR HEART DISEASE 05/23/2008   NECK PAIN, CHRONIC 04/03/2008   DEGENERATIVE JOINT DISEASE, CERVICAL SPINE 06/23/2006   Osteoporosis 06/23/2006   Hyperlipidemia associated with type 2 diabetes mellitus (Sandra [name]) 05/12/2006   DEPRESSION 05/12/2006   Essential hypertension 05/12/2006   ALLERGIC RHINITIS 05/12/2006   ACID REFLUX DISEASE 05/12/2006   SCIATICA 05/12/2006   Past Medical History:  Past Medical History:  Diagnosis Date   Abnormal blood finding 02/10/2016   Abrasion of ear canal 09/28/2012   Achilles tendon injury 11/27/2010   ACID REFLUX DISEASE 05/12/2006   Qualifier: Diagnosis of  By: Larose Kells MD, Alda Berthold.    Acute [place] [facility])    ALLERGIC RHINITIS 05/12/2006   Qualifier: Diagnosis of  By: Larose Kells MD, [place]    Anxiety state 07/03/2009   Qualifier: Diagnosis of  By: Larose Kells MD, Jose E.    Back pain 07/01/2010   Chronic kidney disease (CKD), stage III (moderate) (Sandra [name]) 05/31/2014   Chronic right SI joint pain 09/22/2013   DEGENERATIVE JOINT DISEASE, CERVICAL SPINE 06/23/2006   Annotation: had a CAT scan with a cervical myelogram that show  left C6-7 and  C5-6 foraminal stenosis Qualifier: Diagnosis of  By: Larose Kells MD, [place] 05/12/2006   Qualifier: Diagnosis of  By: Larose Kells MD, [place]  DIABETES MELLITUS, TYPE II 05/12/2006   Now following w/ Endo at [facility] 04/27/2007   Qualifier: Diagnosis of  By: Jerold Coombe     Dyspnea 02/10/2016   Encephalopathy, hypertensive    Essential hypertension 05/12/2006   Qualifier: Diagnosis of  By: Larose Kells MD, [place]    Fatigue 05/31/2014   GAIT DISTURBANCE 08/07/2009   Qualifier: Diagnosis of  By: Larose Kells  MD, Jose E.    GERD (gastroesophageal reflux disease)    Headache(784.0) 11/27/2010   Hyperlipidemia    Hyperlipidemia associated with type 2 diabetes mellitus ([place]) 05/12/2006   Qualifier: Diagnosis of  By: Larose Kells MD, [place]    ILD (interstitial lung disease) ([place]) 05/31/2015   Leukocytopenia 05/31/2014   Lower abdominal pain 05/17/2014   Nausea with vomiting 09/22/2013   NECK PAIN, CHRONIC 04/03/2008   Qualifier: Diagnosis of  By: Larose Kells MD, [place]    Nodule on liver 12/06/2015   Osteoarthritis 02/10/2016   Osteopenia    Osteoporosis 06/23/2006   Annotation: had a bone density test in 08-2004.  T score was -2.4 Qualifier: Diagnosis of  By: Larose Kells MD, Alda Berthold     [facility] (subarachnoid hemorrhage) ([place])    Sciatica    Scleroderma (Sandra [name]) 02/28/2016   Severe hypertension 03/18/2018   Slurred speech 11/27/2010   Takotsubo cardiomyopathy    s/p stress MI with stress induced CM with normal coronary arteries by cath 2007 with normalization of LVF by echo 04/2005   TIA (transient ischemic attack)    Unspecified cerebral artery occlusion with cerebral infarction 04/07/2012   UTI (urinary tract infection) 11/02/2011   Vasculitis of skin 09/28/2012   Vitamin D deficiency 10/01/2014   Past Surgical History:  Past Surgical History:  Procedure Laterality Date   ABDOMINAL HYSTERECTOMY  1977   no oophorectomy   APPENDECTOMY     CARDIAC CATHETERIZATION     CATARACT EXTRACTION, BILATERAL  2011   SPINAL FUSION  2000   Dr. Vertell Limber   TONSILLECTOMY     HPI:  Pt is a 88 y/o female presenting with AMS, L sided weakness. CT/MRI negative. Found with AKI. PMH includes: DM2, diabetic retinopathy, essential HTN, CKD, TIA, depression   Assessment / Plan / Recommendation Clinical Impression  The patient was seen at bedside for cognitive-linguistic evaluation. The patient has a history of cognitive decline and CVA. Patient verbalized that she lives at home with her daughter, and has support from her son with medication management;  she also reported that both children oversee her financials/money management. The SLUMS was administered to evalaute deficits in aforementioned areas. The patient scored a 14/30, which is below normal limits. Patient's primary deficits included problem-solving, memory and recall. Patient's expressive language and pragmatic skills WNL. Patient was educated on receiving extra familial supports after d/c for financial and medication management.  At the end of the session, patient verbalized that she does not wish to have further medical testing and wishes to go home. SLP to sign off as patient is at baseline. Patient may benefit from [facility] SLP services to promote independence and safety in least restrictive environment.    SLP Assessment  SLP Recommendation/Assessment: All further Speech Lanaguage Pathology  needs can be addressed in the next venue of care SLP Visit Diagnosis: Cognitive communication deficit (R41.841)    Recommendations for follow up therapy are one component of a multi-disciplinary discharge planning process, led by the attending physician.  Recommendations may be updated based on  patient status, additional functional criteria and insurance authorization.    Follow Up Recommendations  Home health SLP    Assistance Recommended at Discharge  Intermittent Supervision/Assistance  Functional Status Assessment Patient has had a recent decline in their functional status and demonstrates the ability to make significant improvements in function in a reasonable and predictable amount of time.  Frequency and Duration           SLP Evaluation Cognition  Overall Cognitive Status: History of cognitive impairments - at baseline Arousal/Alertness: Awake/alert Orientation Level: Oriented X4 Year: 2023 Day of Week: Correct Attention: Focused Focused Attention: Appears intact Memory: Impaired Memory Impairment: Storage deficit;Decreased recall of new information Awareness: Appears  intact Problem Solving: Impaired Problem Solving Impairment: Functional basic Safety/Judgment: Appears intact       Comprehension  Auditory Comprehension Overall Auditory Comprehension: Appears within functional limits for tasks assessed Yes/No Questions: Not tested Commands: Within Functional Limits Conversation: Complex Interfering Components: Working memory EffectiveTechniques: Repetition;Slowed speech;Stressing words Visual Recognition/Discrimination Discrimination: Not tested Reading Comprehension Reading Status: Not tested    Expression Expression Primary Mode of Expression: Verbal Verbal Expression Overall Verbal Expression: Appears within functional limits for tasks assessed Initiation: No impairment Automatic Speech: Name;Social Response Level of Generative/Spontaneous Verbalization: Conversation Repetition:  (NT) Naming: Impairment Responsive: Not tested Confrontation: Not tested Convergent: Not tested Divergent: 50-74% accurate Pragmatics: No impairment Effective Techniques: Open ended questions;Sentence completion Non-Verbal Means of Communication: Not applicable Written Expression Dominant Hand: Right Written Expression: Not tested   Oral / Motor  Oral Motor/Sensory Function Overall Oral Motor/Sensory Function: Within functional limits Motor Speech Overall Motor Speech: Appears within functional limits for tasks assessed Respiration: Within functional limits Phonation: Normal Resonance: Within functional limits Articulation: Within functional limitis Intelligibility: Intelligible Motor Planning: Witnin functional limits Motor Speech Errors: Not applicable Effective Techniques: Increased vocal intensity            [employer] 03/19/2021, 11:37 AM

## 2021-03-19 NOTE — TOC Initial Note (Signed)
Transition of Care (TOC) - Initial/Assessment Note  ? ? ?Patient Details  ?Name: Sandra [name] ?MRN: [id number] ?Date of Birth: 1933/11/23 ? ?Transition of Care (TOC) CM/SW Contact:    ?Pollie Friar, RN ?Phone Number: ?03/19/2021, 3:07 PM ? ?Clinical Narrative:                 ?Patient is from home with her daughter that works 6 pm- 6am. She denies any DME use at home. She takes her own medications and denies any issues. She still drives self.  ?Recommendations for home health services. CM provided choice and she selected [facility]. Information on the AVS.  ?TOC following. ? ?Expected Discharge Plan: [place] ?Barriers to Discharge: Continued Medical Work up ? ? ?Patient Goals and CMS Choice ?  ?CMS Medicare.gov Compare Post Acute Care list provided to:: Patient ?Choice offered to / list presented to : Patient ? ?Expected Discharge Plan and Services ?Expected Discharge Plan: [place] ?  ?Discharge Planning Services: CM Consult ?Post Acute Care Choice: Home Health ?Living arrangements for the past 2 months: [place] ?                ?  ?  ?  ?  ?  ?HH Arranged: PT, OT ?[place] Agency: [facility] ?Date HH Agency Contacted: 03/19/21 ?  ?Representative spoke with at [place]: Erline Levine ? ?Prior Living Arrangements/Services ?Living arrangements for the past 2 months: [place] ?Lives with:: Adult Children ?Patient language and need for interpreter reviewed:: Yes ?Do you feel safe going back to the place where you live?: Yes      ?Need for Family Participation in Patient Care: Yes (Comment) ?Care giver support system in place?:  (intermittent) ?  ?Criminal Activity/Legal Involvement Pertinent to Current Situation/Hospitalization: No - Comment as needed ? ?Activities of Daily Living ?Home Assistive Devices/Equipment: None ?ADL Screening (condition at time of admission) ?Patient's cognitive ability adequate to safely complete daily activities?:  Yes ?Is the patient deaf or have difficulty hearing?: No ?Does the patient have difficulty seeing, even when wearing glasses/contacts?: No ?Does the patient have difficulty concentrating, remembering, or making decisions?: No ?Patient able to express need for assistance with ADLs?: Yes ?Does the patient have difficulty dressing or bathing?: No ?Independently performs ADLs?: Yes (appropriate for developmental age) ?Does the patient have difficulty walking or climbing stairs?: Yes ?Weakness of Legs: None ?Weakness of Arms/Hands: Both ? ?Permission Sought/Granted ?  ?  ?   ?   ?   ?   ? ?Emotional Assessment ?Appearance:: Appears stated age ?Attitude/Demeanor/Rapport: Engaged ?Affect (typically observed): Accepting ?Orientation: : Oriented to Self, Oriented to Place, Oriented to  Time, Oriented to Situation ?  ?Psych Involvement: No (comment) ? ?Admission diagnosis:  Delirium [R41.0] ?Left-sided weakness [R53.1] ?Stroke-like symptoms [R29.90] ?Patient Active Problem List  ? Diagnosis Date Noted  ? Acute kidney injury superimposed on chronic kidney disease ([place]) 03/18/2021  ? Type 2 diabetes mellitus with chronic kidney disease, with long-term current use of insulin ([place]) 03/18/2021  ? Anxiety 03/18/2021  ? Dyslipidemia 03/18/2021  ? Depression 03/18/2021  ? Uncontrolled type 2 diabetes mellitus with hyperglycemia ([place])   ? Benign essential HTN   ? Hypoalbuminemia due to protein-calorie malnutrition ([place])   ? Macrocytosis   ? Acute blood loss anemia   ? Diabetic retinopathy of left eye associated with type 2 diabetes mellitus ([place])   ? Cognitive impairment 06/22/2019  ? Lobar cerebral hemorrhage (  [place]) 06/22/2019  ? ICH (intracerebral hemorrhage) ([facility]) - R occipital - HTN vs CAA 06/18/2019  ? TIA (transient ischemic attack) 04/26/2019  ? Diarrhea 04/24/2019  ? Severe hypertension 03/18/2018  ? Encephalopathy, hypertensive   ? Acute encephalopathy 03/17/2018  ? Scleroderma ([place]) 02/28/2016  ? Dyspnea 02/10/2016  ?  Abnormal blood finding 02/10/2016  ? CAD (coronary artery disease) 02/10/2016  ? Osteoarthritis 02/10/2016  ? Nodule on liver 12/06/2015  ? ILD (interstitial lung disease) ([place]) 05/31/2015  ? Type 2 diabetes mellitus with hyperglycemia ([place]) 03/11/2015  ? Diabetes mellitus type 2 with retinopathy ([place]) 03/04/2015  ? Vitamin D deficiency 10/01/2014  ? Leukocytopenia 05/31/2014  ? Chronic kidney disease (CKD), stage III (moderate) ([place]) 05/31/2014  ? General weakness 05/31/2014  ? Lower abdominal pain 05/17/2014  ? Nausea with vomiting 09/22/2013  ? Chronic right SI joint pain 09/22/2013  ? Vasculitis of skin 09/28/2012  ? Abrasion of ear canal 09/28/2012  ? History of subarachnoid hemorrhage 04/07/2012  ? Unspecified cerebral artery occlusion with cerebral infarction 04/07/2012  ? UTI (urinary tract infection) 11/02/2011  ? General medical examination 01/07/2011  ? Chest pain 11/27/2010  ? Achilles tendon injury 11/27/2010  ? Back pain 07/01/2010  ? Breast pain 04/15/2010  ? GAIT DISTURBANCE 08/07/2009  ? Anxiety state 07/03/2009  ? VALVULAR HEART DISEASE 05/23/2008  ? NECK PAIN, CHRONIC 04/03/2008  ? DEGENERATIVE JOINT DISEASE, CERVICAL SPINE 06/23/2006  ? Osteoporosis 06/23/2006  ? Hyperlipidemia associated with type 2 diabetes mellitus ([place]) 05/12/2006  ? DEPRESSION 05/12/2006  ? Essential hypertension 05/12/2006  ? ALLERGIC RHINITIS 05/12/2006  ? ACID REFLUX DISEASE 05/12/2006  ? SCIATICA 05/12/2006  ? ?PCP:  Nickola Major, MD ?Pharmacy:   ?[facility] [address] ?[place] [address] ?Phone: [phone] Fax: [phone] ? ? ? ? ?Social Determinants of Health (SDOH) Interventions ?  ? ?Readmission Risk Interventions ?No flowsheet data found. ? ? ?

## 2021-03-19 NOTE — Care Management Obs Status (Signed)
MEDICARE OBSERVATION STATUS NOTIFICATION ? ? ?Patient Details  ?Name: Sandra [name] ?MRN: [id number] ?Date of Birth: 1934-01-15 ? ? ?Medicare Observation Status Notification Given:  Yes ? ? ? ?Pollie Friar, RN ?03/19/2021, 10:57 AM ?

## 2021-03-19 NOTE — Progress Notes (Signed)
Occupational Therapy Treatment ?Patient Details ?Name: Sandra [name] ?MRN: [id number] ?DOB: [birth year] ?Today's Date: 03/19/2021 ? ? ?History of present illness Pt is a 88 y/o female presenting with AMS, L sided weakness. CT/MRI negative. Found with AKI. PMH includes: DM2, diabetic retinopathy, essential HTN, CKD, TIA, depression. ?  ?OT comments ? Patient received supine in bed and states she felt depressed.  Patient willing to get out of bed and work with OT.  Patient making good progress requiring supervision to min guard for mobility and standing at sink for self care. Acute OT to continue to follow.   ? ?Recommendations for follow up therapy are one component of a multi-disciplinary discharge planning process, led by the attending physician.  Recommendations may be updated based on patient status, additional functional criteria and insurance authorization. ?   ?Follow Up Recommendations ? Home health OT  ?  ?Assistance Recommended at Discharge Frequent or constant Supervision/Assistance  ?Patient can return home with the following ? A little help with walking and/or transfers;A little help with bathing/dressing/bathroom;Assistance with cooking/housework;Direct supervision/assist for medications management;Assist for transportation;Help with stairs or ramp for entrance ?  ?Equipment Recommendations ? None recommended by OT  ?  ?Recommendations for Other Services   ? ?  ?Precautions / Restrictions Precautions ?Precautions: Fall ?Restrictions ?Weight Bearing Restrictions: No  ? ? ?  ? ?Mobility Bed Mobility ?Overal bed mobility: Needs Assistance ?Bed Mobility: Supine to Sit ?  ?  ?Supine to sit: Supervision ?  ?  ?General bed mobility comments: supervision for safety ?  ? ?Transfers ?Overall transfer level: Needs assistance ?Equipment used: None ?Transfers: Bed to chair/wheelchair/BSC, Sit to/from Stand ?Sit to Stand: Min guard ?  ?  ?Step pivot transfers: Min guard ?  ?  ?General transfer comment: min guard for  safety ?  ?  ?Balance Overall balance assessment: Needs assistance ?Sitting-balance support: No upper extremity supported, Feet supported ?Sitting balance-Leahy Scale: Fair ?  ?  ?Standing balance support: During functional activity, Single extremity supported, No upper extremity supported ?Standing balance-Leahy Scale: Fair ?Standing balance comment: stood at sink for grooming with min guard ?  ?  ?  ?  ?  ?  ?  ?  ?  ?  ?  ?   ? ?ADL either performed or assessed with clinical judgement  ? ?ADL Overall ADL's : Needs assistance/impaired ?  ?  ?Grooming: Set up;Standing;Wash/dry hands;Wash/dry face;Oral care;Brushing hair;Min guard ?Grooming Details (indicate cue type and reason): performed grooming tasks standing at sink ?  ?  ?  ?  ?  ?  ?  ?  ?  ?  ?  ?  ?  ?  ?Functional mobility during ADLs: Min guard;Cueing for safety ?General ADL Comments: patient states she doesn't want help with self care if she can do it ?  ? ?Extremity/Trunk Assessment   ?  ?  ?  ?  ?  ? ?Vision   ?  ?  ?Perception   ?  ?Praxis   ?  ? ?Cognition Arousal/Alertness: Awake/alert ?Behavior During Therapy: [facility] for tasks assessed/performed ?Overall Cognitive Status: History of cognitive impairments - at baseline ?Area of Impairment: Problem solving ?  ?  ?  ?  ?  ?  ?  ?  ?  ?  ?Memory: Decreased short-term memory ?Following Commands: Follows multi-step commands inconsistently ?  ?  ?Problem Solving: Slow processing, Requires verbal cues ?General Comments: patient states she felt depressed.  Attempted to walk without assistance and leaving IV pole  behind ?  ?  ?   ?Exercises   ? ?  ?Shoulder Instructions   ? ? ?  ?General Comments    ? ? ?Pertinent Vitals/ Pain       Pain Assessment ?Pain Assessment: Faces ?Faces Pain Scale: No hurt ?Pain Intervention(s): Monitored during session ? ?Home Living Family/patient expects to be discharged to:: Private residence ?Living Arrangements: Children ?Available Help at Discharge: Family;Available  PRN/intermittently ?Type of Home: House ?  ?  ?  ?  ?  ?  ?  ?  ?  ?  ?  ?  ?  ?  ? Lives With: Family;Daughter ? ?  ?Prior Functioning/Environment    ?  ?  ?  ?   ? ?Frequency ? Min 2X/week  ? ? ? ? ?  ?Progress Toward Goals ? ?OT Goals(current goals can now be found in the care plan section) ? Progress towards OT goals: Progressing toward goals ? ?Acute Rehab OT Goals ?Patient Stated Goal: get better ?OT Goal Formulation: With patient ?Time For Goal Achievement: 04/01/21 ?Potential to Achieve Goals: Good ?ADL Goals ?Pt Will Perform Grooming: with modified independence;standing ?Pt Will Perform Lower Body Dressing: with modified independence;sit to/from stand ?Pt Will Transfer to Toilet: with modified independence;ambulating ?Pt Will Perform Toileting - Clothing Manipulation and hygiene: with modified independence;sit to/from stand ?Additional ADL Goal #1: Pt will recall and complete 3 step trail making task with independence.  ?Plan Discharge plan remains appropriate   ? ?Co-evaluation ? ? ?   ?  ?  ?  ?  ? ?  ?AM-PAC OT "6 Clicks" Daily Activity     ?Outcome Measure ? ? Help from another person eating meals?: A Little ?Help from another person taking care of personal grooming?: A Little ?Help from another person toileting, which includes using toliet, bedpan, or urinal?: A Little ?Help from another person bathing (including washing, rinsing, drying)?: A Little ?Help from another person to put on and taking off regular upper body clothing?: A Little ?Help from another person to put on and taking off regular lower body clothing?: A Little ?6 Click Score: 18 ? ?  ?End of Session Equipment Utilized During Treatment: Gait belt ? ?OT Visit Diagnosis: Muscle weakness (generalized) (M62.81);Unsteadiness on feet (R26.81) ?  ?Activity Tolerance Patient tolerated treatment well ?  ?Patient Left in chair;with call bell/phone within reach ?  ?Nurse Communication Mobility status ?  ? ?   ? ?Time: 8882-8003 ?OT Time Calculation  (min): 26 min ? ?Charges: OT General Charges ?$OT Visit: 1 Visit ?OT Treatments ?$Self Care/Home Management : 23-37 mins ? ?Lodema Hong, OTA ?Acute Rehabilitation Services  ?Pager [phone] ?Office [phone] ? ? ?[place] ?03/19/2021, 1:45 PM ?

## 2021-03-19 NOTE — Progress Notes (Addendum)
Inpatient Diabetes Program Recommendations ? ?AACE/ADA: New Consensus Statement on Inpatient Glycemic Control (2015) ? ?Target Ranges:  Prepandial:   less than 140 mg/dL ?     Peak postprandial:   less than 180 mg/dL (1-2 hours) ?     Critically ill patients:  140 - 180 mg/dL  ? ? Latest Reference Range & Units 03/18/21 08:59 03/18/21 12:47 03/18/21 17:02 03/18/21 22:14  ?Glucose-Capillary 70 - 99 mg/dL 195 (H) 281 (H) ? ?11 units Novolog ? ?7 units Levemir @1138  ? 172 (H) ? ?3 units Novolog ? 155 (H) ? ?3 units Novolog ? ?7 units Levemir @2222  ?  ? ? Latest Reference Range & Units 03/19/21 06:13 03/19/21 12:28  ?Glucose-Capillary 70 - 99 mg/dL 118 (H) 262 (H)  ? ? ? Latest Reference Range & Units 03/18/21 09:26  ?Hemoglobin A1C 4.8 - 5.6 % 10.0 (H) ? ?(240 mg/dl)  ? ? ?Admit with:  ?TIA ?Acute Kidney Injury on Chronic Kidney Disease ?Hyperkalemia ? ?History: DM2, CKD ? ?Home DM Meds: Amaryl 4 mg BID ?       Levemir 33 units AM/ 22 units QHS ?       Novolog 10 units TID with meals ? ?Current Orders: Amaryl 2 mg daily ?    Novolog Moderate Correction Scale/ SSI (0-15 units) TID AC + HS ?    Levemir 7 units BID ? ? ? ?Note Levemir started yesterday AM ? ?CBG looks good this AM 118 ? ?Current A1c of 10% shows poor control at home--Needs follow up with PCP ? ?Endocrinologist: Dr. Grandville Silos with [facility] ?Last seen 02/04/2021 ?Was told to Decrease Levemir to 25 units AM/ 10 units PM ?Continue Novolog 10 units TID with meals ?Stop the Amaryl ? ? ?MD- Please consider starting Novolog Meal Coverage:  ? ?Novolog 3 units TID with meals ? ?HOLD if pt eats <50% meals ? ? ?--Will follow patient during hospitalization-- ? ?Wyn Quaker RN, MSN, CDE ?Diabetes Coordinator ?Inpatient Glycemic Control Team ?Team Pager: [phone] (8a-5p) ? ? ? ? ?--Will follow patient during hospitalization-- ? ?Wyn Quaker RN, MSN, CDE ?Diabetes Coordinator ?Inpatient Glycemic Control Team ?Team Pager: [phone] (8a-5p) ? ?

## 2021-03-20 LAB — BASIC METABOLIC PANEL
Anion gap: 6 (ref 5–15)
BUN: 32 mg/dL — ABNORMAL HIGH (ref 8–23)
CO2: 20 mmol/L — ABNORMAL LOW (ref 22–32)
Calcium: 8.2 mg/dL — ABNORMAL LOW (ref 8.9–10.3)
Chloride: 113 mmol/L — ABNORMAL HIGH (ref 98–111)
Creatinine, Ser: 1.56 mg/dL — ABNORMAL HIGH (ref 0.44–1.00)
GFR, Estimated: 32 mL/min — ABNORMAL LOW (ref >60)
Glucose, Bld: 150 mg/dL — ABNORMAL HIGH (ref 70–99)
Potassium: 4 mmol/L (ref 3.5–5.1)
Sodium: 139 mmol/L (ref 135–145)

## 2021-03-20 LAB — URINALYSIS, ROUTINE W REFLEX MICROSCOPIC
Bilirubin Urine: NEGATIVE
Glucose, UA: 50 mg/dL — AB
Ketones, ur: NEGATIVE mg/dL
Leukocytes,Ua: NEGATIVE
Nitrite: NEGATIVE
Protein, ur: 30 mg/dL — AB
Specific Gravity, Urine: 1.01 (ref 1.005–1.030)
pH: 5 (ref 5.0–8.0)

## 2021-03-20 LAB — GLUCOSE, CAPILLARY
Glucose-Capillary: 131 mg/dL — ABNORMAL HIGH (ref 70–99)
Glucose-Capillary: 203 mg/dL — ABNORMAL HIGH (ref 70–99)

## 2021-03-20 MED ORDER — DICYCLOMINE HCL 10 MG PO CAPS: 10.0000 mg | ORAL_CAPSULE | Freq: Every day | ORAL | 0 refills | Status: DC | PRN

## 2021-03-20 MED ORDER — AMLODIPINE BESYLATE 10 MG PO TABS: 10.0000 mg | ORAL_TABLET | Freq: Every day | ORAL | 0 refills | Status: DC

## 2021-03-20 MED ORDER — DICYCLOMINE HCL 10 MG PO CAPS
10.0000 mg | ORAL_CAPSULE | Freq: Every day | ORAL | Status: DC | PRN
  Filled 2021-03-20: qty 1

## 2021-03-20 NOTE — Discharge Summary (Signed)
Physician Discharge Summary   Patient: Sandra [name] MRN: [id number] DOB: 06/22/1933  Admit date:     03/17/2021  Discharge date: 03/20/21  Discharge Physician: Antonieta Pert   PCP: Nickola Major, MD   Recommendations at discharge:   PC follow-up in 1 week Check CBC BMP in 1 week  Hospital Course: 88 year old past medical history significant for type 2 diabetes, with diabetic retinopathy, hypertension, stage IIIb CKD, TIA, depression presented with acute onset generalized weakness, with associated difficulty with ambulation, altered mental status and confusion.  She reports headache. Evaluation in the ED: Blood pressure 140/85--creatinine 1.6, previous creatinine 1.2, PCR COVID-negative.  Chest x-ray left basilar atelectasis. Code stroke was called, CT head negative for acute infarct.  MRI no acute intracranial abnormality.  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 Neurology was consulted for initial code stroke evaluation, patient was thought not to be a candidate for tPA. Patient was found to have hyperkalemia potassium 7.7, hyperglycemia CBG 369, AKI with a creatinine 1.8. Patient creatinine has improved to baseline at 1.5 with IV fluid hydration.  Ultrasound showed bilateral renal cortical thinning.  Advised BMP check in a week.  Patient was treated for generalized weakness deconditioning seen by PT OT home health has been recommended.  Patient with anxiety depression dyslipidemia type 2 diabetes with A1c at 10 more or less stable she will continue her current home meds.  Patient was seen by neurology due to concern for TIA but at this time low suspicion for TIA per neurology likely generalized debility metabolic echo normal EF carotid Dopplers bilateral ICA current persistent disease MRI negative for acute stroke. She is medically stable for discharge.  Patient had some dysuria/questionable spasm of the bladder this morning but no recurrence,  bladder scan no retention and UA unremarkable for UTI.  Continue Bentyl as needed.  Discharge Diagnoses: * Acute kidney injury superimposed on chronic kidney disease ([place]) Baseline creatinine around 1.59 improved to baseline value with IV fluid hydration, stop IV fluids, work-up unremarkable for hydronephrosis with ultrasound.  No evidence of UTI.  Tolerating diet.   Depression Mood stable, continue citalopram and Topamax.  Dyslipidemia LDL 102, continue home statin  Anxiety Stable, continue Klonopin.  Type 2 diabetes mellitus with chronic kidney disease, with long-term current use of insulin (HCC) Blood sugar is fairly  controlled patient to continue home regimen A1c is at 10.   Recent Labs  Lab 03/18/21 0926 03/18/21 1247 03/19/21 1228 03/19/21 1636 03/19/21 2122 03/20/21 0626 03/20/21 1154  GLUCAP  --    < > 262* 152* 221* 131* 203*  HGBA1C 10.0*  --   --   --   --   --   --    < > = values in this interval not displayed.    TIA (transient ischemic attack) Low suspicious for TIA per neurology.Generalized weakness likely metabolic. ECHO; N EF, Grade one diastolic dysfunction. carotid Doppler; BL ICA 39 % stenosis.  Resume home meds   General weakness Deconditioning, PT OT Home health has been planned.    Essential hypertension BPH stable amlodipine increased to 10 mg on metoprolol, lisinopril has been discontinued.  Follow-up with PCP.        Consultants: neurology Procedures performed: none  Disposition: Home health Diet recommendation:  Carb modified diet  DISCHARGE MEDICATION: Allergies as of 03/20/2021       Reactions   Cephalexin Nausea And Vomiting   Pt stated made severely sick, will never take again  Nintedanib Diarrhea   Pioglitazone Other (See Comments)   REACTION: EDEMA   Sertraline Nausea And Vomiting        Medication List     STOP taking these medications    lisinopril 40 MG tablet Commonly known as: ZESTRIL       TAKE these  medications    acetaminophen 500 MG tablet Commonly known as: TYLENOL Take 500 mg by mouth every 6 (six) hours as needed for mild pain or headache.   amLODipine 10 MG tablet Commonly known as: NORVASC Take 1 tablet (10 mg total) by mouth daily. What changed: Another medication with the same name was removed. Continue taking this medication, and follow the directions you see here.   aspirin EC 81 MG tablet Take 1 tablet (81 mg total) by mouth daily. Swallow whole.   atorvastatin 40 MG tablet Commonly known as: LIPITOR Take 1 tablet (40 mg total) by mouth daily. What changed: when to take this   CENTRUM ADULTS PO Take 1 tablet by mouth daily.   clonazePAM 0.5 MG tablet Commonly known as: KLONOPIN Take 0.5 mg by mouth 2 (two) times daily as needed for anxiety.   dicyclomine 10 MG capsule Commonly known as: BENTYL Take 1 capsule (10 mg total) by mouth daily as needed for up to 15 doses for spasms.   famotidine 40 MG tablet Commonly known as: PEPCID Take 0.5 tablets (20 mg total) by mouth daily. What changed:  how much to take when to take this   glimepiride 2 MG tablet Commonly known as: AMARYL Take 4 mg by mouth in the morning and at bedtime.   insulin detemir 100 UNIT/ML injection Commonly known as: LEVEMIR Inject 0.07 mLs (7 Units total) into the skin 2 (two) times daily. What changed:  how much to take additional instructions   metoprolol succinate 50 MG 24 hr tablet Commonly known as: TOPROL-XL Take 1 tablet (50 mg total) by mouth daily. Take with or immediately following a meal.   NovoLOG FlexPen 100 UNIT/ML FlexPen Generic drug: insulin aspart Inject 10 Units into the skin 3 (three) times daily before meals.        Follow-up Information     [facility] Follow [facility].   Specialty: Home Health Services Why: the home health agency will contact you for the first home visit. Contact information: [address] [place]  [address] [phone]         Nickola Major, MD Follow up in 1 week(s).   Specialty: Family Medicine Contact information: [address] [place] [address] [phone]         Sueanne Margarita, MD .   Specialty: Cardiology Contact information: [address] [place] [address] [phone]                 Discharge Exam: Danley Danker Weights   03/20/21 1000  Weight: 57.9 kg  Condition at discharge: stable  The results of significant diagnostics from this hospitalization (including imaging, microbiology, ancillary and laboratory) are listed below for reference.   Imaging Studies: MR BRAIN WO CONTRAST  Result Date: 03/17/2021 CLINICAL DATA:  Acute neurologic deficit EXAM: MRI HEAD WITHOUT CONTRAST TECHNIQUE: Multiplanar, multiecho pulse sequences of the brain and surrounding structures were obtained without intravenous contrast. COMPARISON:  None. FINDINGS: Brain: No acute infarct, mass effect or extra-axial collection. Multifocal chronic hemorrhage greatest in posterior right temporal. Hyperintense T2-weighted signal is moderately widespread throughout the white matter. Generalized  volume loss without a clear lobar predilection. 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 The midline structures are normal. Vascular: Major flow voids are preserved. Skull and upper cervical spine: Normal calvarium and skull base. Visualized upper cervical spine and soft tissues are normal. Sinuses/Orbits:No paranasal sinus fluid levels or advanced mucosal thickening. No mastoid or middle ear effusion. Normal orbits. IMPRESSION: 1. No acute intracranial abnormality. 2. 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 Electronically Signed   By: Ulyses Jarred M.D.   On: 03/17/2021 23:42   US RENAL  Result Date: 03/19/2021 CLINICAL DATA:  Acute renal insufficiency. EXAM: RENAL / URINARY TRACT ULTRASOUND COMPLETE COMPARISON:  May 17, 2014  FINDINGS: Right Kidney: Renal measurements: 8.4 x 5.3 x 4.6 = volume: 106 mL. Thinning of the cortex is noted. There is a small amount of perinephric fluid. Left Kidney: Renal measurements: 8.8 x 4.9 x 4.6 cm = volume: 104 mL. Thinning of the cortex is noted. Parapelvic cyst in the midpolar region of the left kidney measures 1.0 x 0.7 x 0.8 cm. Bladder: Appears normal for degree of bladder distention. Ureteral jets are not seen bilaterally. Other: None. IMPRESSION: Bilateral renal cortical thinning. Bilateral peripelvic fluid. Nonvisualization of the ureteral jets, although urinary bladder is normally distended. 1 cm benign-appearing left renal parapelvic cyst. Electronically Signed   By: Fidela Salisbury M.D.   On: 03/19/2021 09:04   DG Chest Portable 1 View  Result Date: 03/17/2021 CLINICAL DATA:  Weakness EXAM: PORTABLE CHEST 1 VIEW COMPARISON:  06/23/2019 FINDINGS: Low lung volumes. Heart is normal size. Right lung clear. Left base atelectasis. No effusions. No acute bony abnormality. Aortic atherosclerosis. IMPRESSION: Low lung volumes, left base atelectasis. Electronically Signed   By: Rolm Baptise M.D.   On: 03/17/2021 19:44   ECHOCARDIOGRAM COMPLETE BUBBLE STUDY  Result Date: 03/18/2021    ECHOCARDIOGRAM REPORT   Patient Name:   KATHYJO BRIERE Date of Exam: 03/18/2021 Medical Rec #:  [id number]     Height:       60.0 in Accession #:    [id number]    Weight:       136.0 lb Date of Birth:  [birth year]    BSA:          1.584 m Patient Age:    21 years      BP:           159/55 mmHg Patient Gender: F             HR:           73 bpm. Exam Location:  Inpatient Procedure: 2D Echo, Cardiac Doppler, Color Doppler and Saline Contrast Bubble            Study Indications:    TIA  History:        Patient has prior history of Echocardiogram examinations. Risk                 Factors:Diabetes, Dyslipidemia and Hypertension.  Sonographer:    Jyl Heinz Referring Phys: [id number] JAN A [place]  1. Left  ventricular ejection fraction, by estimation, is 60 to 65%. The left ventricle has normal function. Left ventricular endocardial border not optimally defined to evaluate regional wall motion. Left ventricular diastolic parameters are consistent with Grade I diastolic dysfunction (impaired relaxation).  2. Right ventricular systolic function is normal. The right ventricular size is normal. Tricuspid regurgitation signal is inadequate for assessing PA pressure.  3. The mitral valve is grossly normal. No  evidence of mitral valve regurgitation.  4. The aortic valve was not well visualized. Aortic valve regurgitation is trivial.  5. The inferior vena cava is normal in size with greater than 50% respiratory variability, suggesting right atrial pressure of 3 mmHg.  6. Agitated saline contrast bubble study was negative, with no evidence of any interatrial shunt. Comparison(s): No significant change from prior study. FINDINGS  Left Ventricle: Left ventricular ejection fraction, by estimation, is 60 to 65%. The left ventricle has normal function. Left ventricular endocardial border not optimally defined to evaluate regional wall motion. The left ventricular internal cavity size was normal in size. There is no left ventricular hypertrophy. Left ventricular diastolic parameters are consistent with Grade I diastolic dysfunction (impaired relaxation). Right Ventricle: The right ventricular size is normal. No increase in right ventricular wall thickness. Right ventricular systolic function is normal. Tricuspid regurgitation signal is inadequate for assessing PA pressure. Left Atrium: Left atrial size was normal in size. Right Atrium: Right atrial size was normal in size. Pericardium: Trivial pericardial effusion is present. Mitral Valve: The mitral valve is grossly normal. No evidence of mitral valve regurgitation. Tricuspid Valve: The tricuspid valve is not well visualized. Tricuspid valve regurgitation is not demonstrated. No  evidence of tricuspid stenosis. Aortic Valve: The aortic valve was not well visualized. Aortic valve regurgitation is trivial. Aortic valve peak gradient measures 8.6 mmHg. Pulmonic Valve: The pulmonic valve was not well visualized. Pulmonic valve regurgitation is not visualized. No evidence of pulmonic stenosis. Aorta: The aortic root and ascending aorta are structurally normal, with no evidence of dilitation. Venous: The inferior vena cava is normal in size with greater than 50% respiratory variability, suggesting right atrial pressure of 3 mmHg. IAS/Shunts: No atrial level shunt detected by color flow Doppler. Agitated saline contrast was given intravenously to evaluate for intracardiac shunting. Agitated saline contrast bubble study was negative, with no evidence of any interatrial shunt.  LEFT VENTRICLE PLAX 2D LVIDd:         4.60 cm     Diastology LVIDs:         2.90 cm     LV e' medial:    4.57 cm/s LV PW:         1.10 cm     LV E/e' medial:  13.2 LV IVS:        0.90 cm     LV e' lateral:   5.11 cm/s LVOT diam:     1.80 cm     LV E/e' lateral: 11.8 LV SV:         55 LV SV Index:   35 LVOT Area:     2.54 cm  LV Volumes (MOD) LV vol d, MOD A2C: 62.4 ml LV vol d, MOD A4C: 64.1 ml LV vol s, MOD A2C: 21.7 ml LV vol s, MOD A4C: 24.0 ml LV SV MOD A2C:     40.7 ml LV SV MOD A4C:     64.1 ml LV SV MOD BP:      41.3 ml RIGHT VENTRICLE             IVC RV Basal diam:  2.60 cm     IVC diam: 1.20 cm RV Mid diam:    2.20 cm RV S prime:     11.00 cm/s TAPSE (M-mode): 1.6 cm LEFT ATRIUM             Index        RIGHT ATRIUM  Index LA diam:        3.80 cm 2.40 cm/m   RA Area:     7.07 cm LA Vol (A2C):   24.8 ml 15.65 ml/m  RA Volume:   11.20 ml 7.07 ml/m LA Vol (A4C):   39.0 ml 24.61 ml/m LA Biplane Vol: 33.1 ml 20.89 ml/m  AORTIC VALVE AV Area (Vmax): 1.63 cm AV Vmax:        147.00 cm/s AV Peak Grad:   8.6 mmHg LVOT Vmax:      94.00 cm/s LVOT Vmean:     69.000 cm/s LVOT VTI:       0.217 m  AORTA Ao Root diam:  2.70 cm Ao Asc diam:  2.60 cm MITRAL VALVE MV Area (PHT): 4.21 cm    SHUNTS MV Decel Time: 180 msec    Systemic VTI:  0.22 m MV E velocity: 60.40 cm/s  Systemic Diam: 1.80 cm MV A velocity: 83.60 cm/s MV E/A ratio:  0.72 Rudean Haskell MD Electronically signed by Rudean Haskell MD Signature Date/Time: 03/18/2021/12:29:49 PM    Final    CT HEAD CODE STROKE WO CONTRAST  Result Date: 03/17/2021 CLINICAL DATA:  Code stroke.  Neuro deficit, acute, stroke suspected EXAM: CT HEAD WITHOUT CONTRAST TECHNIQUE: Contiguous axial images were obtained from the base of the skull through the vertex without intravenous contrast. RADIATION DOSE REDUCTION: This exam was performed according to the departmental dose-optimization program which includes automated exposure control, adjustment of the mA and/or kV according to patient size and/or use of iterative reconstruction technique. COMPARISON:  06/18/2019 FINDINGS: Brain: No acute intracranial hemorrhage, mass effect, or edema. No new loss of gray-white differentiation. There is encephalomalacia/gliosis in the right occipitotemporal lobes at the site of prior hemorrhage. Additional patchy and confluent areas of hypoattenuation in the supratentorial white matter are nonspecific but probably reflect moderate chronic microvascular ischemic changes. Prominence of the ventricles and sulci reflects parenchymal volume loss. There is no extra-axial collection. Vascular: No hyperdense vessel. Skull: Unremarkable. Sinuses/Orbits: Head no acute abnormality. Other: Mastoid air cells are clear. ASPECTS ([place] Stroke Program Early CT Score) - Ganglionic level infarction (caudate, lentiform nuclei, internal capsule, insula, M1-M3 cortex): 7 - Supraganglionic infarction (M4-M6 cortex): 3 Total score (0-10 with 10 being normal): 10 IMPRESSION: There is no acute intracranial hemorrhage or evidence of acute infarction. ASPECT score is 10. These results were communicated to Dr.  Lorrin Goodell at 3:03 pm on 03/17/2021 by text page via the [facility] messaging system. Electronically Signed   By: Macy Mis M.D.   On: 03/17/2021 15:05   VAS US CAROTID (at [facility] and [facility] only)  Result Date: 03/19/2021 Carotid Arterial Duplex Study Patient Name:  NATORI GUDINO  Date of Exam:   03/18/2021 Medical Rec #: [id number]      Accession #:    [id number] Date of Birth: 04-04-1933     Patient Gender: F Patient Age:   38 years Exam Location:  [facility] Procedure:      VAS US CAROTID Referring Phys: Eugenie Norrie --------------------------------------------------------------------------------  Indications:       TIA. Risk Factors:      Hypertension, hyperlipidemia, Diabetes, coronary artery                    disease. Limitations        Today's exam was limited due to the high bifurcation of the  carotid and heavy calcification and the resulting shadowing. Comparison Study:  06-20-2019 Prior carotid duplex showed 1-39% stenosis                    bilaterally. Performing Technologist: Darlin Coco RDMS, RVT  Examination Guidelines: A complete evaluation includes B-mode imaging, spectral Doppler, color Doppler, and power Doppler as needed of all accessible portions of each vessel. Bilateral testing is considered an integral part of a complete examination. Limited examinations for reoccurring indications may be performed as noted.  Right Carotid Findings: +----------+--------+--------+--------+------------------+--------+             PSV cm/s EDV cm/s Stenosis Plaque Description Comments  +----------+--------+--------+--------+------------------+--------+  CCA Prox   77       13                                             +----------+--------+--------+--------+------------------+--------+  CCA Distal 59       13                                             +----------+--------+--------+--------+------------------+--------+  ICA Prox   84       21       1-39%    calcific                      +----------+--------+--------+--------+------------------+--------+  ICA Distal 98       26                                   tortuous  +----------+--------+--------+--------+------------------+--------+  ECA        89                                                      +----------+--------+--------+--------+------------------+--------+ +----------+--------+-------+----------------+-------------------+             PSV cm/s EDV cms Describe         Arm Pressure (mmHG)  +----------+--------+-------+----------------+-------------------+  Subclavian 100              Multiphasic, WNL                      +----------+--------+-------+----------------+-------------------+ +---------+--------+--+--------+--+---------+  Vertebral PSV cm/s 67 EDV cm/s 16 Antegrade  +---------+--------+--+--------+--+---------+  Left Carotid Findings: +----------+--------+--------+--------+------------------+--------+             PSV cm/s EDV cm/s Stenosis Plaque Description Comments  +----------+--------+--------+--------+------------------+--------+  CCA Prox   79       14                                   tortuous  +----------+--------+--------+--------+------------------+--------+  CCA Distal 62       8                                              +----------+--------+--------+--------+------------------+--------+  ICA Prox   94       19       1-39%                                 +----------+--------+--------+--------+------------------+--------+  ICA Distal 82       23                                   tortuous  +----------+--------+--------+--------+------------------+--------+  ECA        106                                                     +----------+--------+--------+--------+------------------+--------+ +----------+--------+--------+----------------+-------------------+             PSV cm/s EDV cm/s Describe         Arm Pressure (mmHG)  +----------+--------+--------+----------------+-------------------+  Subclavian 114                Multiphasic, WNL                      +----------+--------+--------+----------------+-------------------+ +---------+--------+--+--------+--+---------+  Vertebral PSV cm/s 50 EDV cm/s 10 Antegrade  +---------+--------+--+--------+--+---------+   Summary: Right Carotid: Velocities in the right ICA are consistent with a 1-39% stenosis. Left Carotid: Velocities in the left ICA are consistent with a 1-39% stenosis. Vertebrals:  Bilateral vertebral arteries demonstrate antegrade flow. Subclavians: Normal flow hemodynamics were seen in bilateral subclavian              arteries. *See table(s) above for measurements and observations.  Electronically signed by Antony Contras MD on 03/19/2021 at 10:01:01 AM.    Final     Microbiology: Results for orders placed or performed during the hospital encounter of 03/17/21  Resp Panel by RT-PCR (Flu A&B, Covid) Nasopharyngeal Swab     Status: None   Collection Time: 03/17/21  3:24 PM   Specimen: Nasopharyngeal Swab; Nasopharyngeal(NP) swabs in vial transport medium  Result Value Ref Range Status   SARS Coronavirus 2 by RT PCR NEGATIVE NEGATIVE Final    Comment: (NOTE) SARS-CoV-2 target nucleic acids are NOT DETECTED.  The SARS-CoV-2 RNA is generally detectable in upper respiratory specimens during the acute phase of infection. The lowest concentration of SARS-CoV-2 viral copies this assay can detect is 138 copies/mL. A negative result does not preclude SARS-Cov-2 infection and should not be used as the sole basis for treatment or other patient management decisions. A negative result may occur with  improper specimen collection/handling, submission of specimen other than nasopharyngeal swab, presence of viral mutation(s) within the areas targeted by this assay, and inadequate number of viral copies(<138 copies/mL). A negative result must be combined with clinical observations, patient history, and epidemiological information. The expected result is  Negative.  Fact Sheet for Patients:  EntrepreneurPulse.com.au  Fact Sheet for Healthcare Providers:  IncredibleEmployment.be  This test is no t yet approved or cleared by the [place] FDA and  has been authorized for detection and/or diagnosis of SARS-CoV-2 by FDA under an Emergency Use Authorization (EUA). This EUA will remain  in effect (meaning this test can be used) for the duration of the COVID-19 declaration under Section 564(b)(1) of the Act, 21 U.S.C.section 360bbb-3(b)(1),  unless the authorization is terminated  or revoked sooner.       Influenza A by PCR NEGATIVE NEGATIVE Final   Influenza B by PCR NEGATIVE NEGATIVE Final    Comment: (NOTE) The Xpert Xpress SARS-CoV-2/FLU/RSV plus assay is intended as an aid in the diagnosis of influenza from Nasopharyngeal swab specimens and should not be used as a sole basis for treatment. Nasal washings and aspirates are unacceptable for Xpert Xpress SARS-CoV-2/FLU/RSV testing.  Fact Sheet for Patients: EntrepreneurPulse.com.au  Fact Sheet for Healthcare Providers: IncredibleEmployment.be  This test is not yet approved or cleared by the [place] FDA and has been authorized for detection and/or diagnosis of SARS-CoV-2 by FDA under an Emergency Use Authorization (EUA). This EUA will remain in effect (meaning this test can be used) for the duration of the COVID-19 declaration under Section 564(b)(1) of the Act, 21 U.S.C. section 360bbb-3(b)(1), unless the authorization is terminated or revoked.  Performed at [facility], [place] [address] [place], [place] [address]     Labs: CBC: Recent Labs  Lab 03/17/21 1535 03/17/21 1700 03/18/21 0928  WBC  --  8.3 8.9  NEUTROABS  --  5.6  --   HGB 12.2 11.8* 12.9  HCT 36.0 35.9* 38.4  MCV  --  97.6 97.0  PLT  --  201 353   Basic Metabolic Panel: Recent Labs  Lab 03/17/21 1535 03/17/21 1700  03/18/21 0927 03/19/21 0128 03/20/21 0117  NA 132* 135 136 138 139  K 7.7* 5.1 4.5 4.3 4.0  CL 108 105 106 110 113*  CO2  --  18* 19* 20* 20*  GLUCOSE 369* 299* 203* 110* 150*  BUN 68* 43* 34* 38* 32*  CREATININE 1.80* 1.69* 1.56* 1.70* 1.56*  CALCIUM  --  9.3 9.5 8.9 8.2*   Liver Function Tests: Recent Labs  Lab 03/17/21 1700  AST 21  ALT 16  ALKPHOS 83  BILITOT 0.6  PROT 6.8  ALBUMIN 3.7   CBG: Recent Labs  Lab 03/19/21 1228 03/19/21 1636 03/19/21 2122 03/20/21 0626 03/20/21 1154  GLUCAP 262* 152* 221* 131* 203*    Discharge time spent: 25 minutes.  Signed: Antonieta Pert, MD [facility] 03/20/2021

## 2021-03-20 NOTE — TOC Transition Note (Signed)
Transition of Care (TOC) - CM/SW Discharge Note ? ? ?Patient Details  ?Name: Sandra [name] ?MRN: [id number] ?Date of Birth: [birth year] ? ?Transition of Care (TOC) CM/SW Contact:  ?Pollie Friar, RN ?Phone Number: ?03/20/2021, 1:27 PM ? ? ?Clinical Narrative:    ?Patient is discharging home with home health services through [facility]. Information on the AVS.  ?Pt has transportation home today.  ? ? ?Final next level of care: [place] ?Barriers to Discharge: No Barriers Identified ? ? ?Patient Goals and CMS Choice ?  ?CMS Medicare.gov Compare Post Acute Care list provided to:: Patient ?Choice offered to / list presented to : Patient ? ?Discharge Placement ?  ?           ?  ?  ?  ?  ? ?Discharge Plan and Services ?  ?Discharge Planning Services: CM Consult ?Post Acute Care Choice: Home Health          ?  ?  ?  ?  ?  ?HH Arranged: PT, OT ?[facility] Agency: [place] ?Date HH Agency Contacted: 03/19/21 ?  ?Representative spoke with at [place]: Erline Levine ? ?Social Determinants of Health (SDOH) Interventions ?  ? ? ?Readmission Risk Interventions ?No flowsheet data found. ? ? ? ? ?

## 2021-03-20 NOTE — Progress Notes (Signed)
Physical Therapy Treatment ?Patient Details ?Name: Sandra [name] ?MRN: [id number] ?DOB: 04-02-1933 ?Today's Date: 03/20/2021 ? ? ?History of Present Illness Pt is a 88 y/o female presenting with AMS, L sided weakness. CT/MRI negative. Found with AKI. PMH includes: DM2, diabetic retinopathy, essential HTN, CKD, TIA, depression. ? ?  ?PT Comments  ? ? Pt has made good progress toward goals.   Emphasis today on bed mobility, safe transfers and progression of gait without AD or compensations. ?  ?Recommendations for follow up therapy are one component of a multi-disciplinary discharge planning process, led by the attending physician.  Recommendations may be updated based on patient status, additional functional criteria and insurance authorization. ? ?Follow Up Recommendations ? Home health PT ?  ?  ?Assistance Recommended at Discharge Intermittent Supervision/Assistance  ?Patient can return home with the following A little help with bathing/dressing/bathroom;Assistance with cooking/housework;Assist for transportation;Help with stairs or ramp for entrance ?  ?Equipment Recommendations ? None recommended by PT  ?  ?Recommendations for Other Services   ? ? ?  ?Precautions / Restrictions Precautions ?Precautions: Fall  ?  ? ?Mobility ? Bed Mobility ?Overal bed mobility: Needs Assistance ?Bed Mobility: Supine to Sit, Sit to Supine ?  ?  ?Supine to sit: Supervision ?Sit to supine: Supervision ?  ?General bed mobility comments: supervision for safety ?  ? ?Transfers ?Overall transfer level: Needs assistance ?Equipment used: None ?  ?Sit to Stand: Supervision ?  ?  ?  ?  ?  ?General transfer comment: safety only ?  ? ?Ambulation/Gait ?Ambulation/Gait assistance: Supervision ?Gait Distance (Feet): 250 Feet ?Assistive device: None ?Gait Pattern/deviations: Step-through pattern, Decreased stride length ?  ?Gait velocity interpretation: 1.31 - 2.62 ft/sec, indicative of limited community ambulator ?  ?General Gait Details: Episodes  of mild deviation, no LOB.  Generally steady overall, especially after "warmed up" ? ? ?Stairs ?Stairs: Yes ?Stairs assistance: Supervision ?Stair Management: One rail Right, Step to pattern, Forwards ?Number of Stairs: 3 ?General stair comments: safe with the rail ? ? ?Wheelchair Mobility ?  ? ?Modified Rankin (Stroke Patients Only) ?  ? ? ?  ?Balance Overall balance assessment: Needs assistance ?  ?Sitting balance-Leahy Scale: Good ?  ?  ?  ?Standing balance-Leahy Scale: Fair ?  ?  ?  ?  ?  ?  ?  ?  ?  ?  ?  ?  ?  ? ?  ?Cognition Arousal/Alertness: Awake/alert ?Behavior During Therapy: [facility] for tasks assessed/performed ?Overall Cognitive Status: History of cognitive impairments - at baseline ?  ?  ?  ?  ?  ?  ?  ?  ?  ?  ?  ?  ?  ?  ?  ?  ?  ?  ?  ? ?  ?Exercises   ? ?  ?General Comments General comments (skin integrity, edema, etc.): no dyspnea, dizziness.  Pt acknowleged she is not 100%, but feels safe in her usual home environment ?  ?  ? ?Pertinent Vitals/Pain Pain Assessment ?Faces Pain Scale: No hurt ?Pain Intervention(s): Monitored during session  ? ? ?Home Living   ?  ?  ?  ?  ?  ?  ?  ?  ?  ?   ?  ?Prior Function    ?  ?  ?   ? ?PT Goals (current goals can now be found in the care plan section) Acute Rehab PT Goals ?Patient Stated Goal: to go home ?PT Goal Formulation: With patient ?Time For Goal Achievement:  04/01/21 ?Potential to Achieve Goals: Good ?Progress towards PT goals: Progressing toward goals ? ?  ?Frequency ? ? ? Min 3X/week ? ? ? ?  ?PT Plan    ? ? ?Co-evaluation   ?  ?  ?  ?  ? ?  ?AM-PAC PT "6 Clicks" Mobility   ?Outcome Measure ? Help needed turning from your back to your side while in a flat bed without using bedrails?: None ?Help needed moving from lying on your back to sitting on the side of a flat bed without using bedrails?: A Little ?Help needed moving to and from a bed to a chair (including a wheelchair)?: A Little ?Help needed standing up from a chair using your arms (e.g.,  wheelchair or bedside chair)?: A Little ?Help needed to walk in hospital room?: A Little ?Help needed climbing 3-5 steps with a railing? : A Little ?6 Click Score: 19 ? ?  ?End of Session   ?Activity Tolerance: Patient tolerated treatment well ?Patient left: in bed;with call bell/phone within reach ?Nurse Communication: Mobility status ?PT Visit Diagnosis: Unsteadiness on feet (R26.81);Other abnormalities of gait and mobility (R26.89) ?  ? ? ?Time: 9937-1696 ?PT Time Calculation (min) (ACUTE ONLY): 18 min ? ?Charges:  $Gait Training: 8-22 mins          ?          ? ?03/20/2021 ? ?Ginger Carne., PT ?Acute Rehabilitation Services ?[phone]  (pager) ?[phone]  (office) ? ? ?Tessie Fass Omario Ander ?03/20/2021, 2:04 PM ? ?

## 2021-03-21 ENCOUNTER — Ambulatory Visit: Payer: Self-pay

## 2021-03-21 NOTE — Telephone Encounter (Signed)
Summary: discuss medication list from hospital  ? Pt called in for assistance. Pt says that she was seen at the ED and told by the nurse that they prescribed a medication that should dissolve in her mouth. Pt says that she is unsure which medication that is? Pt says that she was given this medication while in the hospital. Pt says that she was kind of out of it and is unsure. Pt would like to know if a nurse could call her back to assist her further?   ?  ? ?Chief Complaint: Asking if any of her medications have to be "put under my tongue and let it melt." ?Symptoms: n/a ?Frequency: n/a ?Pertinent Negatives: Patient denies n/a ?Disposition: [] ED /[] Urgent Care (no appt availability in office) / [] Appointment(In office/virtual)/ []  [facility]/ [] Home Care/ [] Refused Recommended Disposition /[] [facility]/ []  Follow-up with PCP ?Additional Notes: Reviewed pt.'s medications, none are sublingual , verbalizes understanding.  ?Answer Assessment - Initial Assessment Questions ?1. NAME of MEDICATION: "What medicine are you calling about?" ?    Bentyl ?2. QUESTION: "What is your question?" (e.g., double dose of medicine, side effect) ?    What is it for ?3. PRESCRIBING HCP: "Who prescribed it?" Reason: if prescribed by specialist, call should be referred to that group. ?    The hospital doctor ?4. SYMPTOMS: "Do you have any symptoms?" ?    N/a ?5. SEVERITY: If symptoms are present, ask "Are they mild, moderate or severe?" ?    N/a ?6. PREGNANCY:  "Is there any chance that you are pregnant?" "When was your last menstrual period?" ?    No ? ?Protocols used: Medication Question Call-A-AH ? ?

## 2021-03-26 DIAGNOSIS — R531 Weakness: Secondary | ICD-10-CM | POA: Diagnosis not present

## 2021-03-26 DIAGNOSIS — M1990 Unspecified osteoarthritis, unspecified site: Secondary | ICD-10-CM | POA: Diagnosis not present

## 2021-03-26 DIAGNOSIS — R609 Edema, unspecified: Secondary | ICD-10-CM | POA: Diagnosis not present

## 2021-03-26 DIAGNOSIS — N189 Chronic kidney disease, unspecified: Secondary | ICD-10-CM | POA: Diagnosis not present

## 2021-03-26 DIAGNOSIS — R339 Retention of urine, unspecified: Secondary | ICD-10-CM | POA: Diagnosis not present

## 2021-03-26 DIAGNOSIS — E1165 Type 2 diabetes mellitus with hyperglycemia: Secondary | ICD-10-CM | POA: Diagnosis not present

## 2021-03-31 DIAGNOSIS — I2510 Atherosclerotic heart disease of native coronary artery without angina pectoris: Secondary | ICD-10-CM | POA: Diagnosis not present

## 2021-03-31 DIAGNOSIS — N1832 Chronic kidney disease, stage 3b: Secondary | ICD-10-CM | POA: Diagnosis not present

## 2021-03-31 DIAGNOSIS — N179 Acute kidney failure, unspecified: Secondary | ICD-10-CM | POA: Diagnosis not present

## 2021-03-31 DIAGNOSIS — N2581 Secondary hyperparathyroidism of renal origin: Secondary | ICD-10-CM | POA: Diagnosis not present

## 2021-03-31 DIAGNOSIS — J8489 Other specified interstitial pulmonary diseases: Secondary | ICD-10-CM | POA: Diagnosis not present

## 2021-03-31 DIAGNOSIS — E875 Hyperkalemia: Secondary | ICD-10-CM | POA: Diagnosis not present

## 2021-03-31 DIAGNOSIS — E1122 Type 2 diabetes mellitus with diabetic chronic kidney disease: Secondary | ICD-10-CM | POA: Diagnosis not present

## 2021-03-31 DIAGNOSIS — D631 Anemia in chronic kidney disease: Secondary | ICD-10-CM | POA: Diagnosis not present

## 2021-03-31 DIAGNOSIS — N390 Urinary tract infection, site not specified: Secondary | ICD-10-CM | POA: Diagnosis not present

## 2021-03-31 DIAGNOSIS — I129 Hypertensive chronic kidney disease with stage 1 through stage 4 chronic kidney disease, or unspecified chronic kidney disease: Secondary | ICD-10-CM | POA: Diagnosis not present

## 2021-04-01 DIAGNOSIS — N1832 Chronic kidney disease, stage 3b: Secondary | ICD-10-CM | POA: Diagnosis not present

## 2021-04-29 DIAGNOSIS — I10 Essential (primary) hypertension: Secondary | ICD-10-CM | POA: Diagnosis not present

## 2021-04-29 DIAGNOSIS — N1832 Chronic kidney disease, stage 3b: Secondary | ICD-10-CM | POA: Diagnosis not present

## 2021-05-05 DIAGNOSIS — I6609 Occlusion and stenosis of unspecified middle cerebral artery: Secondary | ICD-10-CM | POA: Diagnosis not present

## 2021-05-05 DIAGNOSIS — H524 Presbyopia: Secondary | ICD-10-CM | POA: Diagnosis not present

## 2021-05-16 DIAGNOSIS — F418 Other specified anxiety disorders: Secondary | ICD-10-CM | POA: Diagnosis not present

## 2021-05-16 DIAGNOSIS — E7800 Pure hypercholesterolemia, unspecified: Secondary | ICD-10-CM | POA: Diagnosis not present

## 2021-05-16 DIAGNOSIS — E1165 Type 2 diabetes mellitus with hyperglycemia: Secondary | ICD-10-CM | POA: Diagnosis not present

## 2021-05-16 DIAGNOSIS — Z794 Long term (current) use of insulin: Secondary | ICD-10-CM | POA: Diagnosis not present

## 2021-05-16 DIAGNOSIS — Z7984 Long term (current) use of oral hypoglycemic drugs: Secondary | ICD-10-CM | POA: Diagnosis not present

## 2021-05-16 DIAGNOSIS — I10 Essential (primary) hypertension: Secondary | ICD-10-CM | POA: Diagnosis not present

## 2021-05-16 DIAGNOSIS — N189 Chronic kidney disease, unspecified: Secondary | ICD-10-CM | POA: Diagnosis not present

## 2021-05-22 DIAGNOSIS — N1832 Chronic kidney disease, stage 3b: Secondary | ICD-10-CM | POA: Diagnosis not present

## 2021-05-26 DIAGNOSIS — I129 Hypertensive chronic kidney disease with stage 1 through stage 4 chronic kidney disease, or unspecified chronic kidney disease: Secondary | ICD-10-CM | POA: Diagnosis not present

## 2021-05-26 DIAGNOSIS — D631 Anemia in chronic kidney disease: Secondary | ICD-10-CM | POA: Diagnosis not present

## 2021-05-26 DIAGNOSIS — E1122 Type 2 diabetes mellitus with diabetic chronic kidney disease: Secondary | ICD-10-CM | POA: Diagnosis not present

## 2021-05-26 DIAGNOSIS — N2581 Secondary hyperparathyroidism of renal origin: Secondary | ICD-10-CM | POA: Diagnosis not present

## 2021-05-26 DIAGNOSIS — N1832 Chronic kidney disease, stage 3b: Secondary | ICD-10-CM | POA: Diagnosis not present

## 2021-05-26 DIAGNOSIS — I2510 Atherosclerotic heart disease of native coronary artery without angina pectoris: Secondary | ICD-10-CM | POA: Diagnosis not present

## 2021-05-26 DIAGNOSIS — E875 Hyperkalemia: Secondary | ICD-10-CM | POA: Diagnosis not present

## 2021-05-26 DIAGNOSIS — N179 Acute kidney failure, unspecified: Secondary | ICD-10-CM | POA: Diagnosis not present

## 2021-05-26 DIAGNOSIS — J8489 Other specified interstitial pulmonary diseases: Secondary | ICD-10-CM | POA: Diagnosis not present

## 2021-06-10 DIAGNOSIS — Z0000 Encounter for general adult medical examination without abnormal findings: Secondary | ICD-10-CM | POA: Diagnosis not present

## 2021-06-10 DIAGNOSIS — Z1331 Encounter for screening for depression: Secondary | ICD-10-CM | POA: Diagnosis not present

## 2021-06-23 DIAGNOSIS — Z0100 Encounter for examination of eyes and vision without abnormal findings: Secondary | ICD-10-CM | POA: Diagnosis not present

## 2021-07-02 DIAGNOSIS — N189 Chronic kidney disease, unspecified: Secondary | ICD-10-CM | POA: Diagnosis not present

## 2021-07-02 DIAGNOSIS — N1832 Chronic kidney disease, stage 3b: Secondary | ICD-10-CM | POA: Diagnosis not present

## 2021-07-02 DIAGNOSIS — N390 Urinary tract infection, site not specified: Secondary | ICD-10-CM | POA: Diagnosis not present

## 2021-07-19 ENCOUNTER — Ambulatory Visit (HOSPITAL_COMMUNITY)
Admission: EM | Admit: 2021-07-19 | Discharge: 2021-07-19 | Disposition: A | Discharge status: Home / Self Care | Payer: Medicare HMO | Attending: Emergency Medicine | Admitting: Emergency Medicine

## 2021-07-19 DIAGNOSIS — H9201 Otalgia, right ear: Secondary | ICD-10-CM | POA: Diagnosis not present

## 2021-07-19 DIAGNOSIS — K029 Dental caries, unspecified: Secondary | ICD-10-CM

## 2021-07-19 MED ORDER — NEOMYCIN-POLYMYXIN-HC 3.5-10000-1 OT SUSP: 3.0000 [drp] | Freq: Two times a day (BID) | OTIC | 0 refills | Status: AC

## 2021-07-19 NOTE — Discharge Instructions (Addendum)
Please use the drops twice daily.  You can return to the urgent care if your symptoms do not improve.  I recommend following up with your primary care provider regarding your symptoms.  As soon as you are able, make an appointment with your dentist.

## 2021-07-19 NOTE — ED Triage Notes (Signed)
Onset x 2-3 days for ear pain.

## 2021-07-19 NOTE — ED Provider Notes (Signed)
[name]    CSN: [id number] Arrival date & time: 07/19/21  1007      History   Chief Complaint Chief Complaint  Patient presents with   Ear Pain    HPI Sandra [name] is a 88 y.o. female.  Presents after waking up this morning with right ear pain.  She was gardening yesterday and felt something fly by her ear.  Did not feel a bite or sting, but then this morning had pain in the ear. Non-severe. Reports some lower jaw gum pain as well.  History of dental abscess in this area. Denies any fevers, muffled hearing, change in hearing, ear drainage.  No recent swimming, diving, flying.  Does not use Q-tips.  No ringing in the ears. History of diabetes  Past Medical History:  Diagnosis Date   Abnormal blood finding 02/10/2016   Abrasion of ear canal 09/28/2012   Achilles tendon injury 11/27/2010   ACID REFLUX DISEASE 05/12/2006   Qualifier: Diagnosis of  By: Larose Kells MD, [place]    Acute MI [facility])    ALLERGIC RHINITIS 05/12/2006   Qualifier: Diagnosis of  By: Larose Kells MD, [place]    Anxiety state 07/03/2009   Qualifier: Diagnosis of  By: Larose Kells MD, [place] pain 07/01/2010   Chronic kidney disease (CKD), stage III (moderate) (HCC) 05/31/2014   Chronic right SI joint pain 09/22/2013   DEGENERATIVE JOINT DISEASE, CERVICAL SPINE 06/23/2006   Annotation: had a CAT scan with a cervical myelogram that show  left C6-7 and  C5-6 foraminal stenosis Qualifier: Diagnosis of  By: Larose Kells MD, [place] 05/12/2006   Qualifier: Diagnosis of  By: Larose Kells MD, [place], TYPE II 05/12/2006   Now following w/ Endo at [facility] 04/27/2007   Qualifier: Diagnosis of  By: Jerold Coombe     Dyspnea 02/10/2016   Encephalopathy, hypertensive    Essential hypertension 05/12/2006   Qualifier: Diagnosis of  By: Larose Kells MD, [place]    Fatigue 05/31/2014   GAIT DISTURBANCE 08/07/2009   Qualifier: Diagnosis of  By: Larose Kells MD, [place].    GERD (gastroesophageal reflux disease)     Headache(784.0) 11/27/2010   Hyperlipidemia    Hyperlipidemia associated with type 2 diabetes mellitus ([place]) 05/12/2006   Qualifier: Diagnosis of  By: Larose Kells MD, [place]    ILD (interstitial lung disease) ([place]) 05/31/2015   Leukocytopenia 05/31/2014   Lower abdominal pain 05/17/2014   Nausea with vomiting 09/22/2013   NECK PAIN, CHRONIC 04/03/2008   Qualifier: Diagnosis of  By: Larose Kells MD, [place]    Nodule on liver 12/06/2015   Osteoarthritis 02/10/2016   Osteopenia    Osteoporosis 06/23/2006   Annotation: had a bone density test in 08-2004.  T score was -2.4 Qualifier: Diagnosis of  By: Larose Kells MD, [place]th[place]     [facility] (subarachnoid hemorrhage) ([place])    Sciatica    Scleroderma ([place]) 02/28/2016   Severe hypertension 03/18/2018   Slurred speech 11/27/2010   Takotsubo cardiomyopathy    s/p stress MI with stress induced CM with normal coronary arteries by cath 2007 with normalization of LVF by echo 04/2005   TIA (transient ischemic attack)    Unspecified cerebral artery occlusion with cerebral infarction 04/07/2012   UTI (urinary tract infection) 11/02/2011   Vasculitis of skin 09/28/2012   Vitamin D deficiency 10/01/2014    Patient Active Problem List  Diagnosis Date Noted   Acute kidney injury superimposed on chronic kidney disease ([place]) 03/18/2021   Type 2 diabetes mellitus with chronic kidney disease, with long-term current use of insulin ([place]) 03/18/2021   Anxiety 03/18/2021   Dyslipidemia 03/18/2021   Depression 03/18/2021   Uncontrolled type 2 diabetes mellitus with hyperglycemia (HCC)    Benign essential HTN    Hypoalbuminemia due to protein-calorie malnutrition (HCC)    Macrocytosis    Acute blood loss anemia    Diabetic retinopathy of left eye associated with type 2 diabetes mellitus ([place])    Cognitive impairment 06/22/2019   Lobar cerebral hemorrhage ([place]) 06/22/2019   ICH (intracerebral hemorrhage) (HCC) - R occipital - HTN vs CAA 06/18/2019   TIA (transient ischemic attack) 04/26/2019    Diarrhea 04/24/2019   Severe hypertension 03/18/2018   Encephalopathy, hypertensive    Acute encephalopathy 03/17/2018   Scleroderma ([place]) 02/28/2016   Dyspnea 02/10/2016   Abnormal blood finding 02/10/2016   CAD (coronary artery disease) 02/10/2016   Osteoarthritis 02/10/2016   Nodule on liver 12/06/2015   ILD (interstitial lung disease) ([place]) 05/31/2015   Type 2 diabetes mellitus with hyperglycemia ([place]) 03/11/2015   Diabetes mellitus type 2 with retinopathy ([place]) 03/04/2015   Vitamin D deficiency 10/01/2014   Leukocytopenia 05/31/2014   Chronic kidney disease (CKD), stage III (moderate) ([place]) 05/31/2014   General weakness 05/31/2014   Lower abdominal pain 05/17/2014   Nausea with vomiting 09/22/2013   Chronic right SI joint pain 09/22/2013   Vasculitis of skin 09/28/2012   Abrasion of ear canal 09/28/2012   History of subarachnoid hemorrhage 04/07/2012   Unspecified cerebral artery occlusion with cerebral infarction 04/07/2012   UTI (urinary tract infection) 11/02/2011   General medical examination 01/07/2011   Chest pain 11/27/2010   Achilles tendon injury 11/27/2010   Back pain 07/01/2010   Breast pain 04/15/2010   GAIT DISTURBANCE 08/07/2009   Anxiety state 07/03/2009   VALVULAR HEART DISEASE 05/23/2008   NECK PAIN, CHRONIC 04/03/2008   DEGENERATIVE JOINT DISEASE, CERVICAL SPINE 06/23/2006   Osteoporosis 06/23/2006   Hyperlipidemia associated with type 2 diabetes mellitus ([place]) 05/12/2006   DEPRESSION 05/12/2006   Essential hypertension 05/12/2006   ALLERGIC RHINITIS 05/12/2006   ACID REFLUX DISEASE 05/12/2006   SCIATICA 05/12/2006    Past Surgical History:  Procedure Laterality Date   ABDOMINAL HYSTERECTOMY  1977   no oophorectomy   APPENDECTOMY     CARDIAC CATHETERIZATION     CATARACT EXTRACTION, BILATERAL  2011   SPINAL FUSION  2000   Dr. Vertell Limber   TONSILLECTOMY      OB History   No obstetric history on file.      Home Medications    Prior to  Admission medications   Medication Sig Start Date End Date Taking? Authorizing Provider  neomycin-polymyxin-hydrocortisone (CORTISPORIN) 3.5-10000-1 OTIC suspension Place 3 drops into the right ear 2 (two) times daily for 4 days. 07/19/21 07/23/21 Yes Chelesea Weiand, Wells Guiles, PA-C  acetaminophen (TYLENOL) 500 MG tablet Take 500 mg by mouth every 6 (six) hours as needed for mild pain or headache.    [provider]  amLODipine (NORVASC) 10 MG tablet Take 1 tablet (10 mg total) by mouth daily. 03/20/21 04/19/21  Antonieta Pert, MD  aspirin EC 81 MG tablet Take 1 tablet (81 mg total) by mouth daily. Swallow whole. 08/30/19   Frann Rider, NP  atorvastatin (LIPITOR) 40 MG tablet Take 1 tablet (40 mg total) by mouth daily. Patient taking differently: Take 40 mg by  mouth every evening. 07/06/19   Love, Ivan Anchors, PA-C  clonazePAM (KLONOPIN) 0.5 MG tablet Take 0.5 mg by mouth 2 (two) times daily as needed for anxiety. 03/10/21   [provider]  dicyclomine (BENTYL) 10 MG capsule Take 1 capsule (10 mg total) by mouth daily as needed for up to 15 doses for spasms. 03/20/21   Antonieta Pert, MD  famotidine (PEPCID) 40 MG tablet Take 0.5 tablets (20 mg total) by mouth daily. Patient taking differently: Take 40 mg by mouth at bedtime. 07/05/19 04/26/21  Love, Ivan Anchors, PA-C  glimepiride (AMARYL) 2 MG tablet Take 4 mg by mouth in the morning and at bedtime.    [provider]  insulin detemir (LEVEMIR) 100 UNIT/ML injection Inject 0.07 mLs (7 Units total) into the skin 2 (two) times daily. Patient taking differently: Inject 22-33 Units into the skin 2 (two) times daily. 33units in the morning and 22units at bedtime 07/05/19   Love, Ivan Anchors, PA-C  metoprolol succinate (TOPROL-XL) 50 MG 24 hr tablet Take 1 tablet (50 mg total) by mouth daily. Take with or immediately following a meal. 07/05/19   Love, Ivan Anchors, PA-C  Multiple Vitamins-Minerals (CENTRUM ADULTS PO) Take 1 tablet by mouth daily.    [provider]  NOVOLOG FLEXPEN 100 UNIT/ML FlexPen Inject 10 Units into the skin 3 (three) times daily before meals. 02/18/21   [provider]    Family History Family History  Problem Relation Age of Onset   Intracerebral hemorrhage Daughter        assoc with post-partum   Stroke Mother    Diabetes Sister    Asthma Sister    Diabetes Sister    Breast cancer Other        ?Aunt   Cancer Other        breast?   Coronary artery disease Neg Hx     Social History Social History   Tobacco Use   Smoking status: Never    Passive exposure: Yes   Smokeless tobacco: Never   Tobacco comments:    Mother & Children  Vaping Use   Vaping Use: Never used  Substance Use Topics   Alcohol use: Yes    Alcohol/week: 1.0 standard drink of alcohol    Types: 1 Glasses of wine per week    Comment: occ   Drug use: No     Allergies   Cephalexin, Nintedanib, Pioglitazone, and Sertraline   Review of Systems Review of Systems  Per HPI  Physical Exam Triage Vital Signs ED Triage Vitals [07/19/21 1037]  Enc Vitals Group     BP (!) 139/59     Pulse Rate 67     Resp 16     Temp 98.7 F (37.1 C)     Temp Source Oral     SpO2 100 %     Weight      Height      Head Circumference      Peak Flow      Pain Score      Pain Loc      Pain Edu?      Excl. in [place]?    No data found.  Updated Vital Signs BP (!) 139/59 (BP Location: Left Arm)   Pulse 67   Temp 98.7 F (37.1 C) (Oral)   Resp 16   SpO2 100%    Physical Exam Vitals and nursing note reviewed.  Constitutional:      General: She is  not in acute distress. HENT:     Head:     Jaw: There is normal jaw occlusion. No tenderness, swelling or pain on movement.     Right Ear: Hearing, tympanic membrane, ear canal and external ear normal. No drainage, swelling or tenderness. No foreign body. No mastoid tenderness. Tympanic membrane is not injected, perforated, erythematous, retracted or bulging.     Left Ear: Hearing, tympanic  membrane, ear canal and external ear normal.     Nose: Nose normal.     Mouth/Throat:     Mouth: Mucous membranes are moist. No oral lesions.     Dentition: Abnormal dentition. Dental caries present. No dental tenderness, gingival swelling or dental abscesses.     Tongue: No lesions.     Pharynx: Oropharynx is clear. Uvula midline.     Comments: Receeding gums Eyes:     Extraocular Movements: Extraocular movements intact.     Conjunctiva/sclera: Conjunctivae normal.     Pupils: Pupils are equal, round, and reactive to light.  Cardiovascular:     Rate and Rhythm: Normal rate and regular rhythm.     Heart sounds: Normal heart sounds.  Pulmonary:     Effort: Pulmonary effort is normal.     Breath sounds: Normal breath sounds.  Abdominal:     General: Abdomen is flat.     Palpations: Abdomen is soft.     Tenderness: There is no abdominal tenderness.  Lymphadenopathy:     Cervical: No cervical adenopathy.  Neurological:     Mental Status: She is alert and oriented to person, place, and time.     UC Treatments / Results  Labs (all labs ordered are listed, but only abnormal results are displayed) Labs Reviewed - No data to display  EKG  Radiology No results found.  Procedures Procedures (including critical care time)  Medications Ordered in UC Medications - No data to display  Initial Impression / Assessment and Plan / UC Course  I have reviewed the triage vital signs and the nursing notes.  Pertinent labs & imaging results that were available during my care of the patient were reviewed by me and considered in my medical decision making (see chart for details).  Exam is unremarkable.  We can try Cortisporin eardrops twice a day for the next few days.  She does have a primary care provider she can follow-up with if symptoms are persistent. I do not note any swelling, abscess, dental infection at this time.  She does have many cavities and dental caries.  Provided low-cost  dental resources in the area for her to follow-up with. Return precautions discussed. Patient agrees to plan and is discharged in stable condition.  Final Clinical Impressions(s) / UC Diagnoses   Final diagnoses:  Right ear pain  Dental caries     Discharge Instructions      Please use the drops twice daily.  You can return to the urgent care if your symptoms do not improve.  I recommend following up with your primary care provider regarding your symptoms.  As soon as you are able, make an appointment with your dentist.    ED Prescriptions     Medication Sig Dispense Auth. Provider   neomycin-polymyxin-hydrocortisone (CORTISPORIN) 3.5-10000-1 OTIC suspension Place 3 drops into the right ear 2 (two) times daily for 4 days. 1.2 mL Taneya Conkel, Wells Guiles, PA-C      PDMP not reviewed this encounter.   Raesha Coonrod, Wells Guiles, [place] 07/19/21 1108

## 2021-09-15 DIAGNOSIS — N189 Chronic kidney disease, unspecified: Secondary | ICD-10-CM | POA: Diagnosis not present

## 2021-09-15 DIAGNOSIS — N1832 Chronic kidney disease, stage 3b: Secondary | ICD-10-CM | POA: Diagnosis not present

## 2021-09-16 DIAGNOSIS — N179 Acute kidney failure, unspecified: Secondary | ICD-10-CM | POA: Diagnosis not present

## 2021-09-16 DIAGNOSIS — I2510 Atherosclerotic heart disease of native coronary artery without angina pectoris: Secondary | ICD-10-CM | POA: Diagnosis not present

## 2021-09-16 DIAGNOSIS — I129 Hypertensive chronic kidney disease with stage 1 through stage 4 chronic kidney disease, or unspecified chronic kidney disease: Secondary | ICD-10-CM | POA: Diagnosis not present

## 2021-09-16 DIAGNOSIS — N2581 Secondary hyperparathyroidism of renal origin: Secondary | ICD-10-CM | POA: Diagnosis not present

## 2021-09-16 DIAGNOSIS — E1122 Type 2 diabetes mellitus with diabetic chronic kidney disease: Secondary | ICD-10-CM | POA: Diagnosis not present

## 2021-09-16 DIAGNOSIS — D631 Anemia in chronic kidney disease: Secondary | ICD-10-CM | POA: Diagnosis not present

## 2021-09-16 DIAGNOSIS — J8489 Other specified interstitial pulmonary diseases: Secondary | ICD-10-CM | POA: Diagnosis not present

## 2021-09-16 DIAGNOSIS — N1832 Chronic kidney disease, stage 3b: Secondary | ICD-10-CM | POA: Diagnosis not present

## 2021-09-16 DIAGNOSIS — E875 Hyperkalemia: Secondary | ICD-10-CM | POA: Diagnosis not present

## 2021-09-23 DIAGNOSIS — E1142 Type 2 diabetes mellitus with diabetic polyneuropathy: Secondary | ICD-10-CM | POA: Diagnosis not present

## 2021-09-23 DIAGNOSIS — Z7984 Long term (current) use of oral hypoglycemic drugs: Secondary | ICD-10-CM | POA: Diagnosis not present

## 2021-09-23 DIAGNOSIS — E1122 Type 2 diabetes mellitus with diabetic chronic kidney disease: Secondary | ICD-10-CM | POA: Diagnosis not present

## 2021-09-23 DIAGNOSIS — N1832 Chronic kidney disease, stage 3b: Secondary | ICD-10-CM | POA: Diagnosis not present

## 2021-09-23 DIAGNOSIS — Z8673 Personal history of transient ischemic attack (TIA), and cerebral infarction without residual deficits: Secondary | ICD-10-CM | POA: Diagnosis not present

## 2021-09-23 DIAGNOSIS — I129 Hypertensive chronic kidney disease with stage 1 through stage 4 chronic kidney disease, or unspecified chronic kidney disease: Secondary | ICD-10-CM | POA: Diagnosis not present

## 2021-09-23 DIAGNOSIS — Z794 Long term (current) use of insulin: Secondary | ICD-10-CM | POA: Diagnosis not present

## 2021-09-23 DIAGNOSIS — E1165 Type 2 diabetes mellitus with hyperglycemia: Secondary | ICD-10-CM | POA: Diagnosis not present

## 2021-09-23 DIAGNOSIS — E1149 Type 2 diabetes mellitus with other diabetic neurological complication: Secondary | ICD-10-CM | POA: Diagnosis not present

## 2021-09-30 DIAGNOSIS — F4310 Post-traumatic stress disorder, unspecified: Secondary | ICD-10-CM | POA: Diagnosis not present

## 2021-09-30 DIAGNOSIS — Z23 Encounter for immunization: Secondary | ICD-10-CM | POA: Diagnosis not present

## 2021-09-30 DIAGNOSIS — F411 Generalized anxiety disorder: Secondary | ICD-10-CM | POA: Diagnosis not present

## 2021-10-07 DIAGNOSIS — E1169 Type 2 diabetes mellitus with other specified complication: Secondary | ICD-10-CM | POA: Diagnosis not present

## 2021-10-07 DIAGNOSIS — I129 Hypertensive chronic kidney disease with stage 1 through stage 4 chronic kidney disease, or unspecified chronic kidney disease: Secondary | ICD-10-CM | POA: Diagnosis not present

## 2021-10-07 DIAGNOSIS — E559 Vitamin D deficiency, unspecified: Secondary | ICD-10-CM | POA: Diagnosis not present

## 2021-10-07 DIAGNOSIS — Z794 Long term (current) use of insulin: Secondary | ICD-10-CM | POA: Diagnosis not present

## 2021-10-07 DIAGNOSIS — E1142 Type 2 diabetes mellitus with diabetic polyneuropathy: Secondary | ICD-10-CM | POA: Diagnosis not present

## 2021-10-07 DIAGNOSIS — E1122 Type 2 diabetes mellitus with diabetic chronic kidney disease: Secondary | ICD-10-CM | POA: Diagnosis not present

## 2021-10-07 DIAGNOSIS — E785 Hyperlipidemia, unspecified: Secondary | ICD-10-CM | POA: Diagnosis not present

## 2021-10-07 DIAGNOSIS — N1832 Chronic kidney disease, stage 3b: Secondary | ICD-10-CM | POA: Diagnosis not present

## 2021-10-07 DIAGNOSIS — E1149 Type 2 diabetes mellitus with other diabetic neurological complication: Secondary | ICD-10-CM | POA: Diagnosis not present

## 2021-10-16 DIAGNOSIS — H40023 Open angle with borderline findings, high risk, bilateral: Secondary | ICD-10-CM | POA: Diagnosis not present

## 2021-10-16 DIAGNOSIS — E119 Type 2 diabetes mellitus without complications: Secondary | ICD-10-CM | POA: Diagnosis not present

## 2021-10-16 DIAGNOSIS — Z961 Presence of intraocular lens: Secondary | ICD-10-CM | POA: Diagnosis not present

## 2021-10-16 DIAGNOSIS — H04123 Dry eye syndrome of bilateral lacrimal glands: Secondary | ICD-10-CM | POA: Diagnosis not present

## 2021-11-13 DIAGNOSIS — E7800 Pure hypercholesterolemia, unspecified: Secondary | ICD-10-CM | POA: Diagnosis not present

## 2021-11-13 DIAGNOSIS — H35033 Hypertensive retinopathy, bilateral: Secondary | ICD-10-CM | POA: Diagnosis not present

## 2021-11-13 DIAGNOSIS — M1990 Unspecified osteoarthritis, unspecified site: Secondary | ICD-10-CM | POA: Diagnosis not present

## 2021-11-13 DIAGNOSIS — E1165 Type 2 diabetes mellitus with hyperglycemia: Secondary | ICD-10-CM | POA: Diagnosis not present

## 2021-11-13 DIAGNOSIS — M810 Age-related osteoporosis without current pathological fracture: Secondary | ICD-10-CM | POA: Diagnosis not present

## 2021-11-13 DIAGNOSIS — K219 Gastro-esophageal reflux disease without esophagitis: Secondary | ICD-10-CM | POA: Diagnosis not present

## 2021-11-13 DIAGNOSIS — Z6827 Body mass index (BMI) 27.0-27.9, adult: Secondary | ICD-10-CM | POA: Diagnosis not present

## 2021-11-13 DIAGNOSIS — F411 Generalized anxiety disorder: Secondary | ICD-10-CM | POA: Diagnosis not present

## 2021-11-13 DIAGNOSIS — N189 Chronic kidney disease, unspecified: Secondary | ICD-10-CM | POA: Diagnosis not present

## 2021-12-08 DIAGNOSIS — N1832 Chronic kidney disease, stage 3b: Secondary | ICD-10-CM | POA: Diagnosis not present

## 2021-12-09 DIAGNOSIS — D631 Anemia in chronic kidney disease: Secondary | ICD-10-CM | POA: Diagnosis not present

## 2021-12-09 DIAGNOSIS — I5181 Takotsubo syndrome: Secondary | ICD-10-CM | POA: Diagnosis not present

## 2021-12-09 DIAGNOSIS — I2510 Atherosclerotic heart disease of native coronary artery without angina pectoris: Secondary | ICD-10-CM | POA: Diagnosis not present

## 2021-12-09 DIAGNOSIS — N2581 Secondary hyperparathyroidism of renal origin: Secondary | ICD-10-CM | POA: Diagnosis not present

## 2021-12-09 DIAGNOSIS — E875 Hyperkalemia: Secondary | ICD-10-CM | POA: Diagnosis not present

## 2021-12-09 DIAGNOSIS — E1122 Type 2 diabetes mellitus with diabetic chronic kidney disease: Secondary | ICD-10-CM | POA: Diagnosis not present

## 2021-12-09 DIAGNOSIS — N179 Acute kidney failure, unspecified: Secondary | ICD-10-CM | POA: Diagnosis not present

## 2021-12-09 DIAGNOSIS — I129 Hypertensive chronic kidney disease with stage 1 through stage 4 chronic kidney disease, or unspecified chronic kidney disease: Secondary | ICD-10-CM | POA: Diagnosis not present

## 2021-12-09 DIAGNOSIS — N1832 Chronic kidney disease, stage 3b: Secondary | ICD-10-CM | POA: Diagnosis not present

## 2021-12-22 DIAGNOSIS — F418 Other specified anxiety disorders: Secondary | ICD-10-CM | POA: Diagnosis not present

## 2021-12-22 DIAGNOSIS — K219 Gastro-esophageal reflux disease without esophagitis: Secondary | ICD-10-CM | POA: Diagnosis not present

## 2021-12-22 DIAGNOSIS — I10 Essential (primary) hypertension: Secondary | ICD-10-CM | POA: Diagnosis not present

## 2021-12-22 DIAGNOSIS — E1165 Type 2 diabetes mellitus with hyperglycemia: Secondary | ICD-10-CM | POA: Diagnosis not present

## 2022-01-04 ENCOUNTER — Emergency Department (HOSPITAL_COMMUNITY): Payer: Medicare HMO

## 2022-01-04 ENCOUNTER — Encounter (HOSPITAL_COMMUNITY): Payer: Self-pay | Admitting: Internal Medicine

## 2022-01-04 ENCOUNTER — Observation Stay (HOSPITAL_COMMUNITY)
Admission: EM | Admit: 2022-01-04 | Discharge: 2022-01-06 | Disposition: A | Discharge status: Home / Self Care | Payer: Medicare HMO | Attending: Internal Medicine | Admitting: Internal Medicine

## 2022-01-04 ENCOUNTER — Observation Stay (HOSPITAL_COMMUNITY): Payer: Medicare HMO

## 2022-01-04 DIAGNOSIS — R9082 White matter disease, unspecified: Secondary | ICD-10-CM | POA: Diagnosis not present

## 2022-01-04 DIAGNOSIS — I10 Essential (primary) hypertension: Secondary | ICD-10-CM | POA: Diagnosis present

## 2022-01-04 DIAGNOSIS — E785 Hyperlipidemia, unspecified: Secondary | ICD-10-CM | POA: Insufficient documentation

## 2022-01-04 DIAGNOSIS — Z7722 Contact with and (suspected) exposure to environmental tobacco smoke (acute) (chronic): Secondary | ICD-10-CM | POA: Diagnosis not present

## 2022-01-04 DIAGNOSIS — Z66 Do not resuscitate: Secondary | ICD-10-CM | POA: Diagnosis not present

## 2022-01-04 DIAGNOSIS — F32A Depression, unspecified: Secondary | ICD-10-CM | POA: Diagnosis present

## 2022-01-04 DIAGNOSIS — E1169 Type 2 diabetes mellitus with other specified complication: Secondary | ICD-10-CM | POA: Insufficient documentation

## 2022-01-04 DIAGNOSIS — N1832 Chronic kidney disease, stage 3b: Secondary | ICD-10-CM | POA: Insufficient documentation

## 2022-01-04 DIAGNOSIS — F0390 Unspecified dementia without behavioral disturbance: Secondary | ICD-10-CM | POA: Diagnosis not present

## 2022-01-04 DIAGNOSIS — Z8673 Personal history of transient ischemic attack (TIA), and cerebral infarction without residual deficits: Secondary | ICD-10-CM | POA: Diagnosis not present

## 2022-01-04 DIAGNOSIS — I129 Hypertensive chronic kidney disease with stage 1 through stage 4 chronic kidney disease, or unspecified chronic kidney disease: Secondary | ICD-10-CM | POA: Diagnosis not present

## 2022-01-04 DIAGNOSIS — I639 Cerebral infarction, unspecified: Secondary | ICD-10-CM | POA: Diagnosis not present

## 2022-01-04 DIAGNOSIS — Z794 Long term (current) use of insulin: Secondary | ICD-10-CM | POA: Diagnosis not present

## 2022-01-04 DIAGNOSIS — N183 Chronic kidney disease, stage 3 unspecified: Secondary | ICD-10-CM | POA: Diagnosis present

## 2022-01-04 DIAGNOSIS — Z7982 Long term (current) use of aspirin: Secondary | ICD-10-CM | POA: Insufficient documentation

## 2022-01-04 DIAGNOSIS — E1122 Type 2 diabetes mellitus with diabetic chronic kidney disease: Secondary | ICD-10-CM | POA: Diagnosis not present

## 2022-01-04 DIAGNOSIS — Z7984 Long term (current) use of oral hypoglycemic drugs: Secondary | ICD-10-CM | POA: Diagnosis not present

## 2022-01-04 DIAGNOSIS — R4702 Dysphasia: Secondary | ICD-10-CM | POA: Diagnosis not present

## 2022-01-04 DIAGNOSIS — G459 Transient cerebral ischemic attack, unspecified: Principal | ICD-10-CM | POA: Insufficient documentation

## 2022-01-04 DIAGNOSIS — Z79899 Other long term (current) drug therapy: Secondary | ICD-10-CM | POA: Insufficient documentation

## 2022-01-04 DIAGNOSIS — E1165 Type 2 diabetes mellitus with hyperglycemia: Secondary | ICD-10-CM | POA: Diagnosis not present

## 2022-01-04 DIAGNOSIS — I6523 Occlusion and stenosis of bilateral carotid arteries: Secondary | ICD-10-CM | POA: Diagnosis not present

## 2022-01-04 DIAGNOSIS — G319 Degenerative disease of nervous system, unspecified: Secondary | ICD-10-CM | POA: Diagnosis not present

## 2022-01-04 DIAGNOSIS — R29818 Other symptoms and signs involving the nervous system: Secondary | ICD-10-CM | POA: Diagnosis not present

## 2022-01-04 LAB — COMPREHENSIVE METABOLIC PANEL
ALT: 15 U/L (ref 0–44)
AST: 20 U/L (ref 15–41)
Albumin: 3.8 g/dL (ref 3.5–5.0)
Alkaline Phosphatase: 75 U/L (ref 38–126)
Anion gap: 7 (ref 5–15)
BUN: 44 mg/dL — ABNORMAL HIGH (ref 8–23)
CO2: 21 mmol/L — ABNORMAL LOW (ref 22–32)
Calcium: 9.5 mg/dL (ref 8.9–10.3)
Chloride: 111 mmol/L (ref 98–111)
Creatinine, Ser: 1.97 mg/dL — ABNORMAL HIGH (ref 0.44–1.00)
GFR, Estimated: 24 mL/min — ABNORMAL LOW (ref >60)
Glucose, Bld: 146 mg/dL — ABNORMAL HIGH (ref 70–99)
Potassium: 4.7 mmol/L (ref 3.5–5.1)
Sodium: 139 mmol/L (ref 135–145)
Total Bilirubin: 0.6 mg/dL (ref 0.3–1.2)
Total Protein: 7 g/dL (ref 6.5–8.1)

## 2022-01-04 LAB — CBC
HCT: 34.9 % — ABNORMAL LOW (ref 36.0–46.0)
Hemoglobin: 11.5 g/dL — ABNORMAL LOW (ref 12.0–15.0)
MCH: 32.2 pg (ref 26.0–34.0)
MCHC: 33 g/dL (ref 30.0–36.0)
MCV: 97.8 fL (ref 80.0–100.0)
Platelets: 240 10*3/uL (ref 150–400)
RBC: 3.57 MIL/uL — ABNORMAL LOW (ref 3.87–5.11)
RDW: 11.6 % (ref 11.5–15.5)
WBC: 8.1 10*3/uL (ref 4.0–10.5)
nRBC: 0 % (ref 0.0–0.2)

## 2022-01-04 LAB — DIFFERENTIAL
Abs Immature Granulocytes: 0.02 10*3/uL (ref 0.00–0.07)
Basophils Absolute: 0 10*3/uL (ref 0.0–0.1)
Basophils Relative: 1 %
Eosinophils Absolute: 0.2 10*3/uL (ref 0.0–0.5)
Eosinophils Relative: 2 %
Immature Granulocytes: 0 %
Lymphocytes Relative: 27 %
Lymphs Abs: 2.1 10*3/uL (ref 0.7–4.0)
Monocytes Absolute: 0.8 10*3/uL (ref 0.1–1.0)
Monocytes Relative: 10 %
Neutro Abs: 4.9 10*3/uL (ref 1.7–7.7)
Neutrophils Relative %: 60 %

## 2022-01-04 LAB — I-STAT CHEM 8, ED
BUN: 45 mg/dL — ABNORMAL HIGH (ref 8–23)
Calcium, Ion: 1.31 mmol/L (ref 1.15–1.40)
Chloride: 110 mmol/L (ref 98–111)
Creatinine, Ser: 2.1 mg/dL — ABNORMAL HIGH (ref 0.44–1.00)
Glucose, Bld: 145 mg/dL — ABNORMAL HIGH (ref 70–99)
HCT: 33 % — ABNORMAL LOW (ref 36.0–46.0)
Hemoglobin: 11.2 g/dL — ABNORMAL LOW (ref 12.0–15.0)
Potassium: 4.8 mmol/L (ref 3.5–5.1)
Sodium: 142 mmol/L (ref 135–145)
TCO2: 21 mmol/L — ABNORMAL LOW (ref 22–32)

## 2022-01-04 LAB — ETHANOL: Alcohol, Ethyl (B): <10 mg/dL (ref <10)

## 2022-01-04 LAB — CBG MONITORING, ED
Glucose-Capillary: 139 mg/dL — ABNORMAL HIGH (ref 70–99)
Glucose-Capillary: 180 mg/dL — ABNORMAL HIGH (ref 70–99)

## 2022-01-04 LAB — APTT: aPTT: 22 seconds — ABNORMAL LOW (ref 24–36)

## 2022-01-04 LAB — PROTIME-INR
INR: 1 (ref 0.8–1.2)
Prothrombin Time: 13.1 seconds (ref 11.4–15.2)

## 2022-01-04 MED ORDER — ACETAMINOPHEN 325 MG PO TABS: 650.0000 mg | ORAL_TABLET | ORAL | Status: DC | PRN

## 2022-01-04 MED ORDER — ACETAMINOPHEN 160 MG/5ML PO SOLN: 650.0000 mg | ORAL | Status: DC | PRN

## 2022-01-04 MED ORDER — INSULIN DETEMIR 100 UNIT/ML ~~LOC~~ SOLN
22.0000 [IU] | Freq: Every day | SUBCUTANEOUS | Status: DC
  Administered 2022-01-05: 22 [IU] via SUBCUTANEOUS
  Filled 2022-01-04 (×2): qty 0.22

## 2022-01-04 MED ORDER — CLOPIDOGREL BISULFATE 75 MG PO TABS
75.0000 mg | ORAL_TABLET | Freq: Every day | ORAL | Status: DC
  Administered 2022-01-05 – 2022-01-06 (×2): 75 mg via ORAL
  Filled 2022-01-04 (×2): qty 1

## 2022-01-04 MED ORDER — INSULIN DETEMIR 100 UNIT/ML ~~LOC~~ SOLN
33.0000 [IU] | Freq: Every day | SUBCUTANEOUS | Status: DC
  Administered 2022-01-05 – 2022-01-06 (×2): 33 [IU] via SUBCUTANEOUS
  Filled 2022-01-04 (×2): qty 0.33

## 2022-01-04 MED ORDER — CLONAZEPAM 0.5 MG PO TABS
0.5000 mg | ORAL_TABLET | Freq: Two times a day (BID) | ORAL | Status: DC | PRN
  Administered 2022-01-05 (×2): 0.5 mg via ORAL
  Filled 2022-01-04 (×2): qty 1

## 2022-01-04 MED ORDER — ATORVASTATIN CALCIUM 40 MG PO TABS: 40.0000 mg | ORAL_TABLET | Freq: Every evening | ORAL | Status: DC

## 2022-01-04 MED ORDER — SENNOSIDES-DOCUSATE SODIUM 8.6-50 MG PO TABS: 1.0000 | ORAL_TABLET | Freq: Every evening | ORAL | Status: DC | PRN

## 2022-01-04 MED ORDER — ACETAMINOPHEN 650 MG RE SUPP: 650.0000 mg | RECTAL | Status: DC | PRN

## 2022-01-04 MED ORDER — ASPIRIN 81 MG PO TBEC
81.0000 mg | DELAYED_RELEASE_TABLET | Freq: Every day | ORAL | Status: DC
  Administered 2022-01-04 – 2022-01-06 (×3): 81 mg via ORAL
  Filled 2022-01-04 (×3): qty 1

## 2022-01-04 MED ORDER — SODIUM CHLORIDE 0.9% FLUSH
3.0000 mL | Freq: Once | INTRAVENOUS | Status: AC
  Administered 2022-01-04: 3 mL via INTRAVENOUS

## 2022-01-04 MED ORDER — INSULIN ASPART 100 UNIT/ML IJ SOLN: 0.0000 [IU] | Freq: Every day | INTRAMUSCULAR | Status: DC

## 2022-01-04 MED ORDER — FAMOTIDINE 20 MG PO TABS
40.0000 mg | ORAL_TABLET | Freq: Every day | ORAL | Status: DC
  Administered 2022-01-04 – 2022-01-05 (×2): 40 mg via ORAL
  Filled 2022-01-04 (×2): qty 2

## 2022-01-04 MED ORDER — INSULIN ASPART 100 UNIT/ML IJ SOLN
0.0000 [IU] | Freq: Three times a day (TID) | INTRAMUSCULAR | Status: DC
  Administered 2022-01-05: 2 [IU] via SUBCUTANEOUS

## 2022-01-04 MED ORDER — STROKE: EARLY STAGES OF RECOVERY BOOK: Freq: Once | Status: DC

## 2022-01-04 MED ORDER — LORAZEPAM 2 MG/ML IJ SOLN
0.5000 mg | Freq: Once | INTRAMUSCULAR | Status: AC | PRN
  Administered 2022-01-04: 1 mg via INTRAVENOUS
  Filled 2022-01-04: qty 1

## 2022-01-04 MED ORDER — ENOXAPARIN SODIUM 40 MG/0.4ML IJ SOSY
40.0000 mg | PREFILLED_SYRINGE | INTRAMUSCULAR | Status: DC
  Administered 2022-01-04: 40 mg via SUBCUTANEOUS
  Filled 2022-01-04: qty 0.4

## 2022-01-04 MED ORDER — ATORVASTATIN CALCIUM 80 MG PO TABS
80.0000 mg | ORAL_TABLET | Freq: Every evening | ORAL | Status: DC
  Administered 2022-01-05: 80 mg via ORAL
  Filled 2022-01-04: qty 1

## 2022-01-04 NOTE — ED Triage Notes (Signed)
Pt BIB EMS after family said she had rambling speech and was not making sense. Family noticed facial droop as well. Hx of ICH in 2021.

## 2022-01-04 NOTE — Consult Note (Addendum)
Neurology Consultation  Reason for Consult: code stroke  Referring Physician: Dr. Billy Fischer  CC: left facial droop and aphasia  History is obtained from:medical record   HPI: Sandra [name] is a 88 y.o. female with past medical history of MI, CKD, DM, HTN, HA, HLD, SAH, TIA, ICH w/SDH 2021, and cognitive impairment.  she was at the [employer] attempting to order a drink when she suddenly developed inability to speak, with left facial droop. Code stroke was activated by [facility]. LKW 1210.  CT head no acute process, ASPECTS 10.   LKW: 1210 IV thrombolysis given?: no, low NIHSS and exam improving  EVT:  No LVO   Premorbid modified Rankin scale (mRS):  2-Slight disability-UNABLE to perform all activities but does not need assistance   ROS:  Unable to obtain due to altered mental status.   Past Medical History:  Diagnosis Date   Abnormal blood finding 02/10/2016   Abrasion of ear canal 09/28/2012   Achilles tendon injury 11/27/2010   ACID REFLUX DISEASE 05/12/2006   Qualifier: Diagnosis of  By: Larose Kells MD, [place]    Acute MI [facility])    ALLERGIC RHINITIS 05/12/2006   Qualifier: Diagnosis of  By: Larose Kells MD, [place]    Anxiety state 07/03/2009   Qualifier: Diagnosis of  By: Larose Kells MD, [place] pain 07/01/2010   Chronic kidney disease (CKD), stage III (moderate) (HCC) 05/31/2014   Chronic right SI joint pain 09/22/2013   DEGENERATIVE JOINT DISEASE, CERVICAL SPINE 06/23/2006   Annotation: had a CAT scan with a cervical myelogram that show  left C6-7 and  C5-6 foraminal stenosis Qualifier: Diagnosis of  By: Larose Kells MD, [place] 05/12/2006   Qualifier: Diagnosis of  By: Larose Kells MD, [place], TYPE II 05/12/2006   Now following w/ Endo at [facility] 04/27/2007   Qualifier: Diagnosis of  By: Jerold Coombe     Dyspnea 02/10/2016   Encephalopathy, hypertensive    Essential hypertension 05/12/2006   Qualifier: Diagnosis of  By: Larose Kells MD, [place]    Fatigue  05/31/2014   GAIT DISTURBANCE 08/07/2009   Qualifier: Diagnosis of  By: Larose Kells MD, [place].    GERD (gastroesophageal reflux disease)    Headache(784.0) 11/27/2010   Hyperlipidemia    Hyperlipidemia associated with type 2 diabetes mellitus ([place]) 05/12/2006   Qualifier: Diagnosis of  By: Larose Kells MD, [place]    ILD (interstitial lung disease) ([place]) 05/31/2015   Leukocytopenia 05/31/2014   Lower abdominal pain 05/17/2014   Nausea with vomiting 09/22/2013   NECK PAIN, CHRONIC 04/03/2008   Qualifier: Diagnosis of  By: Larose Kells MD, [place]    Nodule on liver 12/06/2015   Osteoarthritis 02/10/2016   Osteopenia    Osteoporosis 06/23/2006   Annotation: had a bone density test in 08-2004.  T score was -2.4 Qualifier: Diagnosis of  By: Larose Kells MD, [place]a Berthold     [facility] (subarachnoid hemorrhage) ([place])    Sciatica    Scleroderma ([place]) 02/28/2016   Severe hypertension 03/18/2018   Slurred speech 11/27/2010   Takotsubo cardiomyopathy    s/p stress MI with stress induced CM with normal coronary arteries by cath 2007 with normalization of LVF by echo 04/2005   TIA (transient ischemic attack)    Unspecified cerebral artery occlusion with cerebral infarction 04/07/2012   UTI (urinary tract infection) 11/02/2011   Vasculitis of skin 09/28/2012  Vitamin D deficiency 10/01/2014     Family History  Problem Relation Age of Onset   Intracerebral hemorrhage Daughter        assoc with post-partum   Stroke Mother    Diabetes Sister    Asthma Sister    Diabetes Sister    Breast cancer Other        ?Aunt   Cancer Other        breast?   Coronary artery disease Neg Hx      Social History:   reports that she has never smoked. She has been exposed to tobacco smoke. She has never used smokeless tobacco. She reports current alcohol use of about 1.0 standard drink of alcohol per week. She reports that she does not use drugs.  Medications No current facility-administered medications for this encounter.  Current Outpatient Medications:     acetaminophen (TYLENOL) 500 MG tablet, Take 500 mg by mouth every 6 (six) hours as needed for mild pain or headache., Disp: , Rfl:    amLODipine (NORVASC) 10 MG tablet, Take 1 tablet (10 mg total) by mouth daily., Disp: 30 tablet, Rfl: 0   aspirin EC 81 MG tablet, Take 1 tablet (81 mg total) by mouth daily. Swallow whole., Disp: 30 tablet, Rfl: 11   atorvastatin (LIPITOR) 40 MG tablet, Take 1 tablet (40 mg total) by mouth daily. (Patient taking differently: Take 40 mg by mouth every evening.), Disp: 30 tablet, Rfl: 0   clonazePAM (KLONOPIN) 0.5 MG tablet, Take 0.5 mg by mouth 2 (two) times daily as needed for anxiety., Disp: , Rfl:    dicyclomine (BENTYL) 10 MG capsule, Take 1 capsule (10 mg total) by mouth daily as needed for up to 15 doses for spasms., Disp: 15 capsule, Rfl: 0   famotidine (PEPCID) 40 MG tablet, Take 0.5 tablets (20 mg total) by mouth daily. (Patient taking differently: Take 40 mg by mouth at bedtime.), Disp: 15 tablet, Rfl: 0   glimepiride (AMARYL) 2 MG tablet, Take 4 mg by mouth in the morning and at bedtime., Disp: , Rfl:    insulin detemir (LEVEMIR) 100 UNIT/ML injection, Inject 0.07 mLs (7 Units total) into the skin 2 (two) times daily. (Patient taking differently: Inject 22-33 Units into the skin 2 (two) times daily. 33units in the morning and 22units at bedtime), Disp: 10 mL, Rfl: 0   metoprolol succinate (TOPROL-XL) 50 MG 24 hr tablet, Take 1 tablet (50 mg total) by mouth daily. Take with or immediately following a meal., Disp: 30 tablet, Rfl: 0   Multiple Vitamins-Minerals (CENTRUM ADULTS PO), Take 1 tablet by mouth daily., Disp: , Rfl:    NOVOLOG FLEXPEN 100 UNIT/ML FlexPen, Inject 10 Units into the skin 3 (three) times daily before meals., Disp: , Rfl:    Exam: Current vital signs: BP (!) 165/61 (BP Location: Right Arm)   Pulse 75   Wt 61.2 kg   SpO2 100%   BMI 26.35 kg/m  Vital signs in last 24 hours: Pulse Rate:  [75] 75 (12/17 1330) BP: (165)/(61) 165/61  (12/17 1330) SpO2:  [100 %] 100 % (12/17 1330) Weight:  [61.2 kg] 61.2 kg (12/17 1300)  GENERAL: Awake, alert in NAD HEENT: - Normocephalic and atraumatic, dry mm LUNGS - Clear to auscultation bilaterally with no wheezes CV - S1S2 RRR, no m/r/g, equal pulses bilaterally. ABDOMEN - Soft, nontender, nondistended with normoactive BS Ext: warm, well perfused, intact peripheral pulses, no edema  NEURO:  Mental Status: AA&Ox self and month and  president. Unable to state correct age, birth date, year. Can follow commands  Language: Intermittent expressive aphasia, with dysarthria  Can name some objects but not others. Paraphasias noted. Word-salad quality to much of her speech output.   Cranial Nerves: PERRL, EOMI, visual fields full, no facial asymmetry, facial sensation intact, hearing intact, tongue/uvula/soft palate midline, normal sternocleidomastoid and trapezius muscle strength. No evidence of tongue atrophy or fasciculations Motor: moves all 4 extremities antigravity with no drift  Tone: is normal and bulk is normal Sensation- Intact to light touch bilaterally Coordination: FTN intact bilaterally, no ataxia in BLE. Gait- deferred  NIHSS 1a Level of Conscious.: 0 1b LOC Questions: 1 1c LOC Commands: 0 2 Best Gaze: 0 3 Visual: 0 4 Facial Palsy: 0 5a Motor Arm - left: 0 5b Motor Arm - Right: 0 6a Motor Leg - Left: 0 6b Motor Leg - Right: 0 7 Limb Ataxia: 0 8 Sensory: 0 9 Best Language: 1 10 Dysarthria: 1 11 Extinct. and Inatten.: 0 TOTAL: 3   Labs I have reviewed labs in epic and the results pertinent to this consultation are:  CBC    Component Value Date/Time   WBC 8.1 01/04/2022 1317   RBC 3.57 (L) 01/04/2022 1317   HGB 11.5 (L) 01/04/2022 1317   HCT 34.9 (L) 01/04/2022 1317   PLT 240 01/04/2022 1317   MCV 97.8 01/04/2022 1317   MCH 32.2 01/04/2022 1317   MCHC 33.0 01/04/2022 1317   RDW 11.6 01/04/2022 1317   LYMPHSABS 2.1 01/04/2022 1317   MONOABS 0.8  01/04/2022 1317   EOSABS 0.2 01/04/2022 1317   BASOSABS 0.0 01/04/2022 1317    CMP     Component Value Date/Time   NA 139 01/04/2022 1317   K 4.7 01/04/2022 1317   CL 111 01/04/2022 1317   CO2 21 (L) 01/04/2022 1317   GLUCOSE 146 (H) 01/04/2022 1317   BUN 44 (H) 01/04/2022 1317   CREATININE 1.97 (H) 01/04/2022 1317   CREATININE 1.49 (H) 12/06/2015 1351   CALCIUM 9.5 01/04/2022 1317   PROT 7.0 01/04/2022 1317   ALBUMIN 3.8 01/04/2022 1317   AST 20 01/04/2022 1317   ALT 15 01/04/2022 1317   ALKPHOS 75 01/04/2022 1317   BILITOT 0.6 01/04/2022 1317   GFRNONAA 24 (L) 01/04/2022 1317   GFRNONAA 33 (L) 12/06/2015 1351   GFRAA 47 (L) 07/04/2019 1010   GFRAA 38 (L) 12/06/2015 1351    Lipid Panel     Component Value Date/Time   CHOL 191 03/18/2021 0927   TRIG 270 (H) 03/18/2021 0927   HDL 35 (L) 03/18/2021 0927   CHOLHDL 5.5 03/18/2021 0927   VLDL 54 (H) 03/18/2021 0927   LDLCALC 102 (H) 03/18/2021 0927   LDLDIRECT 110.0 10/13/2016 1101   No current facility-administered medications on file prior to encounter.   Current Outpatient Medications on File Prior to Encounter  Medication Sig Dispense Refill   acetaminophen (TYLENOL) 500 MG tablet Take 500 mg by mouth every 6 (six) hours as needed for mild pain or headache.     amLODipine (NORVASC) 10 MG tablet Take 1 tablet (10 mg total) by mouth daily. 30 tablet 0   amLODipine (NORVASC) 5 MG tablet Take 5 mg by mouth daily.     aspirin EC 81 MG tablet Take 1 tablet (81 mg total) by mouth daily. Swallow whole. 30 tablet 11   atorvastatin (LIPITOR) 40 MG tablet Take 1 tablet (40 mg total) by mouth daily. (Patient taking differently: Take 40  mg by mouth every evening.) 30 tablet 0   clonazePAM (KLONOPIN) 0.5 MG tablet Take 0.5 mg by mouth 2 (two) times daily as needed for anxiety.     dicyclomine (BENTYL) 10 MG capsule Take 1 capsule (10 mg total) by mouth daily as needed for up to 15 doses for spasms. 15 capsule 0   famotidine  (PEPCID) 40 MG tablet Take 0.5 tablets (20 mg total) by mouth daily. (Patient taking differently: Take 40 mg by mouth at bedtime.) 15 tablet 0   glimepiride (AMARYL) 2 MG tablet Take 4 mg by mouth in the morning and at bedtime.     insulin detemir (LEVEMIR) 100 UNIT/ML injection Inject 0.07 mLs (7 Units total) into the skin 2 (two) times daily. (Patient taking differently: Inject 22-33 Units into the skin 2 (two) times daily. 33units in the morning and 22units at bedtime) 10 mL 0   lisinopril (ZESTRIL) 5 MG tablet Take 5 mg by mouth daily.     metoprolol succinate (TOPROL-XL) 50 MG 24 hr tablet Take 1 tablet (50 mg total) by mouth daily. Take with or immediately following a meal. 30 tablet 0   Multiple Vitamins-Minerals (CENTRUM ADULTS PO) Take 1 tablet by mouth daily.     NOVOLOG FLEXPEN 100 UNIT/ML FlexPen Inject 10 Units into the skin 3 (three) times daily before meals.        Imaging I have reviewed the images obtained:  CT-head 1. No acute intracranial abnormality or significant interval change. 2. Stable remote right occipital lobe infarct. 3. Stable moderate diffuse white matter disease. This likely reflects the sequela of chronic microvascular ischemia. 4. Aspects is 10/10.  Assessment: 88 y.o. female with past medical history of MI, CKD, DM, HTN, HA, HLD, SAH, TIA, ICH w/SDH 2021, and cognitive impairment.  Came by EMS as a Code Stroke for acute onset of left facial droop and aphasia  - Exam reveals expressive aphasia and dysarthria that fluctuates during the assessment. DDx includes acute AMS in a patient with underlying cognitive impairment versus a small stroke or TIA.  - CT head: No acute intracranial abnormality or significant interval change. Stable remote right occipital lobe infarct. Stable moderate diffuse white matter disease. This likely reflects the sequela of chronic microvascular ischemia. Aspects is 10/10.  - TNK was considered, but upon educating patient regarding  risks and benefits including possible ICH as a risk versus benefits of about a 30% increased chance of reversing her speech deficits partially or completely, the patient declined treatment. She was lucid towards the end of the assessment with nearly normalized speech at that time and was deemed competent to provide informed consent/refusal.   Recommendations: - HgbA1c, fasting lipid panel - MRI/MRA of the brain without contrast - Carotid ultrasound - TTE - Frequent neuro checks - Echocardiogram - Prophylactic therapy-Antiplatelet med: Aspirin - dose '81mg'$ . Add Plavix.  - Modified permissive HTN protocol given advanced age. Treat BP if SBP > 180, for the next 24 hours, then gradually normalize.  - Risk factor modification - Telemetry monitoring - PT consult, OT consult, Speech consult - Stroke team to follow  Beulah Gandy DNP, ACNPC-AG  [facility]  I have seen and examined the patient. I have formulated the assessment and recommendations. 88 year old female presenting with acute onset of left facial droop and aphasia. NIHSS 3. Presentation most consistent with TIA. Back to baseline after CT. Recommendations as above.  Electronically signed: Dr. Kerney Elbe

## 2022-01-04 NOTE — ED Provider Notes (Signed)
[facility] EMERGENCY DEPARTMENT Provider Note   CSN: [id number] Arrival date & time: 01/04/22  1304  An emergency department physician performed an initial assessment on this suspected stroke patient at 1305.  History  Chief Complaint  Patient presents with   Code Stroke    Sandra [name] is a 88 y.o. female.  HPI     88yo female with history of DM, hyperlipidemia, ILD, SAH, TIA, who presents with concern for episode of left facial droop and difficulty speaking.  [place] e[place] and was feeling excited to be with everyone when she suddenly felt that the left side of her mouth was drooping.  Family also noted left facial droop.  She was having difficulty speaking.  Lasted less than 30 minutes and now feels back to normal.  No other recent illness, fever, nausea, vomiting, dyspnea, chest pain.   NO numbness, weakness of extremities, change in vision.    Past Medical History:  Diagnosis Date   Achilles tendon injury 11/27/2010   ALLERGIC RHINITIS 05/12/2006   Qualifier: Diagnosis of  By: Larose Kells MD, [place]    Anxiety state 07/03/2009   Qualifier: Diagnosis of  By: Larose Kells MD, [place] pain 07/01/2010   Chronic kidney disease (CKD), stage III (moderate) ([place]) 05/31/2014   Chronic right SI joint pain 09/22/2013   DEGENERATIVE JOINT DISEASE, CERVICAL SPINE 06/23/2006   Annotation: had a CAT scan with a cervical myelogram that show  left C6-7 and  C5-6 foraminal stenosis Qualifier: Diagnosis of  By: Larose Kells MD, [place] 05/12/2006   Qualifier: Diagnosis of  By: Larose Kells MD, [place], TYPE II 05/12/2006   Now following w/ Endo at [place] 04/27/2007   Qualifier: Diagnosis of  By: Jerold Coombe     Encephalopathy, hypertensive    Essential hypertension 05/12/2006   Qualifier: Diagnosis of  By: Larose Kells MD, [place]    GAIT DISTURBANCE 08/07/2009   Qualifier:  Diagnosis of  By: Larose Kells MD, [place] [place]thold.    GERD (gastroesophageal reflux disease)    Hyperlipidemia associated with type 2 diabetes mellitus ([place]) 05/12/2006   Qualifier: Diagnosis of  By: Larose Kells MD, Alda Berthold.    ILD (interstitial lung disease) ([place]) 05/31/2015   NECK PAIN, CHRONIC 04/03/2008   Qualifier: Diagnosis of  By: Larose Kells MD, [place]    Osteoarthritis 02/10/2016   Osteopenia    Osteoporosis 06/23/2006   Annotation: had a bone density test in 08-2004.  T score was -2.4 Qualifier: Diagnosis of  By: Larose Kells MD, Alda Berthold     [facility] (subarachnoid hemorrhage) ([place])    Scleroderma ([place]) 02/28/2016   Takotsubo cardiomyopathy    s/p stress MI with stress induced CM with normal coronary arteries by cath 2007 with normalization of LVF by echo 04/2005   TIA (transient ischemic attack)    Vasculitis of skin 09/28/2012   Vitamin D deficiency 10/01/2014    Home Medications Prior to Admission medications   Medication Sig Start Date End Date Taking? Authorizing Provider  acetaminophen (TYLENOL) 500 MG tablet Take 500 mg by mouth every 6 (six) hours as needed for mild pain or headache.    [provider]  amLODipine (NORVASC) 10 MG tablet Take 1 tablet (10 mg total) by mouth daily. 03/20/21 04/19/21  Antonieta Pert, MD  amLODipine (NORVASC) 5 MG tablet Take 5  mg by mouth daily. 10/14/21   [provider]  aspirin EC 81 MG tablet Take 1 tablet (81 mg total) by mouth daily. Swallow whole. 08/30/19   Frann Rider, NP  atorvastatin (LIPITOR) 40 MG tablet Take 1 tablet (40 mg total) by mouth daily. Patient taking differently: Take 40 mg by mouth every evening. 07/06/19   Love, Ivan Anchors, PA-C  clonazePAM (KLONOPIN) 0.5 MG tablet Take 0.5 mg by mouth 2 (two) times daily as needed for anxiety. 03/10/21   [provider]  dicyclomine (BENTYL) 10 MG capsule Take 1 capsule (10 mg total) by mouth daily as needed for up to 15 doses for spasms. 03/20/21   Antonieta Pert, MD  famotidine (PEPCID) 40 MG tablet Take 0.5  tablets (20 mg total) by mouth daily. Patient taking differently: Take 40 mg by mouth at bedtime. 07/05/19 04/26/21  Love, Ivan Anchors, PA-C  glimepiride (AMARYL) 2 MG tablet Take 4 mg by mouth in the morning and at bedtime.    [provider]  insulin detemir (LEVEMIR) 100 UNIT/ML injection Inject 0.07 mLs (7 Units total) into the skin 2 (two) times daily. Patient taking differently: Inject 22-33 Units into the skin 2 (two) times daily. 33units in the morning and 22units at bedtime 07/05/19   Love, Ivan Anchors, PA-C  lisinopril (ZESTRIL) 5 MG tablet Take 5 mg by mouth daily. 11/29/21   [provider]  metoprolol succinate (TOPROL-XL) 50 MG 24 hr tablet Take 1 tablet (50 mg total) by mouth daily. Take with or immediately following a meal. 07/05/19   Love, Ivan Anchors, PA-C  Multiple Vitamins-Minerals (CENTRUM ADULTS PO) Take 1 tablet by mouth daily.    [provider]  NOVOLOG FLEXPEN 100 UNIT/ML FlexPen Inject 10 Units into the skin 3 (three) times daily before meals. 02/18/21   [provider]      Allergies    Cephalexin, Nintedanib, Pioglitazone, and Sertraline    Review of Systems   Review of Systems  Physical Exam Updated Vital Signs BP (!) 114/97   Pulse 68   Temp 97.8 F (36.6 C) (Oral)   Resp 15   Ht 5' (1.524 m)   Wt 61.3 kg   SpO2 98%   BMI 26.39 kg/m  Physical Exam Vitals and nursing note reviewed.  Constitutional:      General: She is not in acute distress.    Appearance: She is well-developed. She is not diaphoretic.  HENT:     Head: Normocephalic and atraumatic.  Eyes:     Conjunctiva/sclera: Conjunctivae normal.  Cardiovascular:     Rate and Rhythm: Normal rate and regular rhythm.     Heart sounds: Normal heart sounds. No murmur heard.    No friction rub. No gallop.  Pulmonary:     Effort: Pulmonary effort is normal. No respiratory distress.     Breath sounds: Normal breath sounds. No wheezing or rales.  Abdominal:     General: There  is no distension.     Palpations: Abdomen is soft.     Tenderness: There is no abdominal tenderness. There is no guarding.  Musculoskeletal:        General: No tenderness.     Cervical back: Normal range of motion.  Skin:    General: Skin is warm and dry.     Findings: No erythema or rash.  Neurological:     Mental Status: She is alert and oriented to person, place, and time.     ED Results /  Procedures / Treatments   Labs (all labs ordered are listed, but only abnormal results are displayed) Labs Reviewed  APTT - Abnormal; Notable for the following components:      Result Value   aPTT 22 (*)    All other components within normal limits  CBC - Abnormal; Notable for the following components:   RBC 3.57 (*)    Hemoglobin 11.5 (*)    HCT 34.9 (*)    All other components within normal limits  COMPREHENSIVE METABOLIC PANEL - Abnormal; Notable for the following components:   CO2 21 (*)    Glucose, Bld 146 (*)    BUN 44 (*)    Creatinine, Ser 1.97 (*)    GFR, Estimated 24 (*)    All other components within normal limits  I-STAT CHEM 8, ED - Abnormal; Notable for the following components:   BUN 45 (*)    Creatinine, Ser 2.10 (*)    Glucose, Bld 145 (*)    TCO2 21 (*)    Hemoglobin 11.2 (*)    HCT 33.0 (*)    All other components within normal limits  CBG MONITORING, ED - Abnormal; Notable for the following components:   Glucose-Capillary 139 (*)    All other components within normal limits  CBG MONITORING, ED - Abnormal; Notable for the following components:   Glucose-Capillary 180 (*)    All other components within normal limits  PROTIME-INR  DIFFERENTIAL  ETHANOL  HEMOGLOBIN I2L  BASIC METABOLIC PANEL    EKG None  Radiology CT HEAD CODE STROKE WO CONTRAST  Result Date: 01/04/2022 CLINICAL DATA:  Code stroke. Neuro deficit, acute, stroke suspected. EXAM: CT HEAD WITHOUT CONTRAST TECHNIQUE: Contiguous axial images were obtained from the base of the skull through  the vertex without intravenous contrast. RADIATION DOSE REDUCTION: This exam was performed according to the departmental dose-optimization program which includes automated exposure control, adjustment of the mA and/or kV according to patient size and/or use of iterative reconstruction technique. COMPARISON:  CT head without contrast 03/17/2021. MR head without contrast 03/17/2021. FINDINGS: Brain: Remote right occipital lobe infarct is stable. Moderate diffuse white matter disease is similar the prior studies. No acute infarct, hemorrhage, or mass lesion is present. Basal ganglia are intact. Insular ribbon is normal. No acute cortical abnormality is present. The ventricles are proportionate to the degree of atrophy. No significant extraaxial fluid collection is present. The brainstem and cerebellum are within normal limits. Vascular: Atherosclerotic calcifications are present within the cavernous internal carotid arteries bilaterally. No hyperdense vessel is present. Skull: Calvarium is intact. No focal lytic or blastic lesions are present. No significant extracranial soft tissue lesion is present. Sinuses/Orbits: The paranasal sinuses and mastoid air cells are clear. The globes and orbits are within normal limits. ASPECTS [facility] Stroke Program Early CT Score) - Ganglionic level infarction (caudate, lentiform nuclei, internal capsule, insula, M1-M3 cortex): 7/7 - Supraganglionic infarction (M4-M6 cortex): 3/3 Total score (0-10 with 10 being normal): 10/10 IMPRESSION: 1. No acute intracranial abnormality or significant interval change. 2. Stable remote right occipital lobe infarct. 3. Stable moderate diffuse white matter disease. This likely reflects the sequela of chronic microvascular ischemia. 4. Aspects is 10/10. The above was relayed via text pager to DR. ERIC LINDZEN on 01/04/2022 at 13:33 . Electronically Signed   By: San Morelle M.D.   On: 01/04/2022 13:34    Procedures Procedures     Medications Ordered in ED Medications  aspirin EC tablet 81 mg (81 mg Oral Given  01/04/22 2200)  insulin detemir (LEVEMIR) injection 33 Units (has no administration in time range)  famotidine (PEPCID) tablet 40 mg (40 mg Oral Given 01/04/22 2159)  clonazePAM (KLONOPIN) tablet 0.5 mg (has no administration in time range)  enoxaparin (LOVENOX) injection 40 mg (40 mg Subcutaneous Given 01/04/22 2200)   stroke: early stages of recovery book ( Does not apply Not Given 01/04/22 2206)  acetaminophen (TYLENOL) tablet 650 mg (has no administration in time range)    Or  acetaminophen (TYLENOL) 160 MG/5ML solution 650 mg (has no administration in time range)    Or  acetaminophen (TYLENOL) suppository 650 mg (has no administration in time range)  senna-docusate (Senokot-S) tablet 1 tablet (has no administration in time range)  LORazepam (ATIVAN) injection 0.5-1 mg (has no administration in time range)  insulin detemir (LEVEMIR) injection 22 Units (has no administration in time range)  atorvastatin (LIPITOR) tablet 80 mg (has no administration in time range)  insulin aspart (novoLOG) injection 0-15 Units (has no administration in time range)  insulin aspart (novoLOG) injection 0-5 Units (has no administration in time range)  sodium chloride flush (NS) 0.9 % injection 3 mL (3 mLs Intravenous Given 01/04/22 1348)    ED Course/ Medical Decision Making/ A&P                           Medical Decision Making Risk Decision regarding hospitalization.   88yo female with history of DM, hyperlipidemia, ILD, SAH, TIA, who presents with concern for episode of left facial droop and difficulty speaking.  Arrives as CODE STROKE.  DDx includes CVA< TIA, ICH, metabolic abnormality or encephalopathy.  CT head without acute abnormalities.  Labs personally evaluated and interpreted by me shows no clinically significant anemia, electrolyte abnormality.  Hx of episode of neurologic deficit followed by  improvement most consistent with TIA. Will admit for continued care.         Final Clinical Impression(s) / ED Diagnoses Final diagnoses:  TIA (transient ischemic attack)    Rx / DC Orders ED Discharge Orders     None         Gareth Rockers, MD 01/04/22 2217

## 2022-01-04 NOTE — H&P (Signed)
History and Physical    Patient: Sandra [name] ATF:[id number] DOB: September 20, 1933 DOA: 01/04/2022 DOS: the patient was seen and examined on 01/04/2022 PCP: Faustino Congress, NP  Patient coming from: Home - lives with daughter; Donald Prose: Katheran Awe, [phone]    Chief Complaint: Code stroke  HPI: Sandra [name] is a 88 y.o. female with medical history significant of depression/anxiety; stage 3 CKD; DM; HTN; HLD; ILD; and scleroderma presenting with code stroke. She went to [place] to celebrate her birthday with a large family crowd. Everything was fine. She felt like her left upper lip was tingling, she tried to cover her mouth without improvement. "It felt like a stroke because I've had one before." She couldn't talk. Symptoms started improving in the ambulance when she was almost to the hospital. Not sure if it was related to anxiety. She has had strokes in the past, last in Feb 2023 and most serious was in May 2021. She feels ok now, speech is recovered.     ER Course:  Came in as code stroke - aphasia, L facial droop.  ?acute confusion.  Symptoms lasted <30 min, currently asymptomatic.  Needs TIA evaluation.     Review of Systems: As mentioned in the history of present illness. All other systems reviewed and are negative. Past Medical History:  Diagnosis Date   Achilles tendon injury 11/27/2010   ALLERGIC RHINITIS 05/12/2006   Qualifier: Diagnosis of  By: Larose Kells MD, [place]    Anxiety state 07/03/2009   Qualifier: Diagnosis of  By: Larose Kells MD, [place] pain 07/01/2010   Chronic kidney disease (CKD), stage III (moderate) ([place]) 05/31/2014   Chronic right SI joint pain 09/22/2013   DEGENERATIVE JOINT DISEASE, CERVICAL SPINE 06/23/2006   Annotation: had a CAT scan with a cervical myelogram that show  left C6-7 and  C5-6 foraminal stenosis Qualifier: Diagnosis of  By: Larose Kells MD, [place] 05/12/2006   Qualifier: Diagnosis of  By: Larose Kells MD, [place],  TYPE II 05/12/2006   Now following w/ Endo at [facility] 04/27/2007   Qualifier: Diagnosis of  By: Jerold Coombe     Encephalopathy, hypertensive    Essential hypertension 05/12/2006   Qualifier: Diagnosis of  By: Larose Kells MD, [place]    GAIT DISTURBANCE 08/07/2009   Qualifier: Diagnosis of  By: Larose Kells MD, [place]a Berthold.    GERD (gastroesophageal reflux disease)    Hyperlipidemia associated with type 2 diabetes mellitus ([place]) 05/12/2006   Qualifier: Diagnosis of  By: Larose Kells MD, [place]a Berthold.    ILD (interstitial lung disease) ([place]) 05/31/2015   NECK PAIN, CHRONIC 04/03/2008   Qualifier: Diagnosis of  By: Larose Kells MD, [place]    Osteoarthritis 02/10/2016   Osteopenia    Osteoporosis 06/23/2006   Annotation: had a bone density test in 08-2004.  T score was -2.4 Qualifier: Diagnosis of  By: Larose Kells MD, [place]a Berthold     [facility] (subarachnoid hemorrhage) ([place])    Scleroderma ([place]) 02/28/2016   Takotsubo cardiomyopathy    s/p stress MI with stress induced CM with normal coronary arteries by cath 2007 with normalization of LVF by echo 04/2005   TIA (transient ischemic attack)    Vasculitis of skin 09/28/2012   Vitamin D deficiency 10/01/2014   Past Surgical History:  Procedure Laterality Date   ABDOMINAL HYSTERECTOMY  1977   no oophorectomy   APPENDECTOMY  CARDIAC CATHETERIZATION     CATARACT EXTRACTION, BILATERAL  2011   SPINAL FUSION  2000   Dr. Vertell Limber   TONSILLECTOMY     Social History:  reports that she has never smoked. She has been exposed to tobacco smoke. She has never used smokeless tobacco. She reports current alcohol use of about 1.0 standard drink of alcohol per week. She reports that she does not use drugs.  Allergies  Allergen Reactions   Cephalexin Nausea And Vomiting    Pt stated made severely sick, will never take again   Nintedanib Diarrhea   Pioglitazone Other (See Comments)    REACTION: EDEMA   Sertraline Nausea And Vomiting    Family History  Problem Relation Age of  Onset   Intracerebral hemorrhage Daughter        assoc with post-partum   Stroke Mother    Diabetes Sister    Asthma Sister    Diabetes Sister    Breast cancer Other        ?Aunt   Cancer Other        breast?   Coronary artery disease Neg Hx     Prior to Admission medications   Medication Sig Start Date End Date Taking? Authorizing Provider  acetaminophen (TYLENOL) 500 MG tablet Take 500 mg by mouth every 6 (six) hours as needed for mild pain or headache.    [provider]  amLODipine (NORVASC) 10 MG tablet Take 1 tablet (10 mg total) by mouth daily. 03/20/21 04/19/21  Antonieta Pert, MD  aspirin EC 81 MG tablet Take 1 tablet (81 mg total) by mouth daily. Swallow whole. 08/30/19   Frann Rider, NP  atorvastatin (LIPITOR) 40 MG tablet Take 1 tablet (40 mg total) by mouth daily. Patient taking differently: Take 40 mg by mouth every evening. 07/06/19   Love, Ivan Anchors, PA-C  clonazePAM (KLONOPIN) 0.5 MG tablet Take 0.5 mg by mouth 2 (two) times daily as needed for anxiety. 03/10/21   [provider]  dicyclomine (BENTYL) 10 MG capsule Take 1 capsule (10 mg total) by mouth daily as needed for up to 15 doses for spasms. 03/20/21   Antonieta Pert, MD  famotidine (PEPCID) 40 MG tablet Take 0.5 tablets (20 mg total) by mouth daily. Patient taking differently: Take 40 mg by mouth at bedtime. 07/05/19 04/26/21  Love, Ivan Anchors, PA-C  glimepiride (AMARYL) 2 MG tablet Take 4 mg by mouth in the morning and at bedtime.    [provider]  insulin detemir (LEVEMIR) 100 UNIT/ML injection Inject 0.07 mLs (7 Units total) into the skin 2 (two) times daily. Patient taking differently: Inject 22-33 Units into the skin 2 (two) times daily. 33units in the morning and 22units at bedtime 07/05/19   Love, Ivan Anchors, PA-C  metoprolol succinate (TOPROL-XL) 50 MG 24 hr tablet Take 1 tablet (50 mg total) by mouth daily. Take with or immediately following a meal. 07/05/19   Love, Ivan Anchors, PA-C  Multiple  Vitamins-Minerals (CENTRUM ADULTS PO) Take 1 tablet by mouth daily.    [provider]  NOVOLOG FLEXPEN 100 UNIT/ML FlexPen Inject 10 Units into the skin 3 (three) times daily before meals. 02/18/21   [provider]    Physical Exam: Vitals:   01/04/22 1300 01/04/22 1304 01/04/22 1330 01/04/22 1800  BP:  (!) 165/61 (!) 165/61 (!) 132/56  Pulse:  75 75 66  Resp:  16  19  Temp:  98.2 F (36.8 C)    SpO2:  100% 100% 92%  Weight: 61.2 kg      General:  Appears calm and comfortable and is in NAD Eyes:  PERRL, EOMI, normal lids, iris ENT:  grossly normal hearing, lips & tongue, mmm; suboptimal dentition Neck:  no LAD, masses or thyromegaly Cardiovascular:  RRR, no m/r/g. No LE edema.  Respiratory:   CTA bilaterally with no wheezes/rales/rhonchi.  Normal respiratory effort. Abdomen:  soft, NT, ND Skin:  no rash or induration seen on limited exam Musculoskeletal:  grossly normal tone BUE/BLE, good ROM, no bony abnormality Psychiatric:  grossly normal mood and affect, speech fluent and appropriate with scant word-finding difficulty, AOx3 Neurologic:  CN 2-12 grossly intact, moves all extremities in coordinated fashion, sensation intact   Radiological Exams on Admission: Independently reviewed - see discussion in A/P where applicable  CT HEAD CODE STROKE WO CONTRAST  Result Date: 01/04/2022 CLINICAL DATA:  Code stroke. Neuro deficit, acute, stroke suspected. EXAM: CT HEAD WITHOUT CONTRAST TECHNIQUE: Contiguous axial images were obtained from the base of the skull through the vertex without intravenous contrast. RADIATION DOSE REDUCTION: This exam was performed according to the departmental dose-optimization program which includes automated exposure control, adjustment of the mA and/or kV according to patient size and/or use of iterative reconstruction technique. COMPARISON:  CT head without contrast 03/17/2021. MR head without contrast 03/17/2021. FINDINGS: Brain: Remote  right occipital lobe infarct is stable. Moderate diffuse white matter disease is similar the prior studies. No acute infarct, hemorrhage, or mass lesion is present. Basal ganglia are intact. Insular ribbon is normal. No acute cortical abnormality is present. The ventricles are proportionate to the degree of atrophy. No significant extraaxial fluid collection is present. The brainstem and cerebellum are within normal limits. Vascular: Atherosclerotic calcifications are present within the cavernous internal carotid arteries bilaterally. No hyperdense vessel is present. Skull: Calvarium is intact. No focal lytic or blastic lesions are present. No significant extracranial soft tissue lesion is present. Sinuses/Orbits: The paranasal sinuses and mastoid air cells are clear. The globes and orbits are within normal limits. ASPECTS [facility] Stroke Program Early CT Score) - Ganglionic level infarction (caudate, lentiform nuclei, internal capsule, insula, M1-M3 cortex): 7/7 - Supraganglionic infarction (M4-M6 cortex): 3/3 Total score (0-10 with 10 being normal): 10/10 IMPRESSION: 1. No acute intracranial abnormality or significant interval change. 2. Stable remote right occipital lobe infarct. 3. Stable moderate diffuse white matter disease. This likely reflects the sequela of chronic microvascular ischemia. 4. Aspects is 10/10. The above was relayed via text pager to DR. ERIC LINDZEN on 01/04/2022 at 13:33 . Electronically Signed   By: San Morelle M.D.   On: 01/04/2022 13:34    EKG: Independently reviewed.  NSR with rate 64; no evidence of acute ischemia   Labs on Admission: I have personally reviewed the available labs and imaging studies at the time of the admission.  Pertinent labs:   Glucose 146 BUN 44/Creatinine 1.97/GFR 24; 32/1.56/32 on 3/2 WBC 8.1 Hgb 11.5 ETOH <10  Lipids on 9/19: 181/39/100/316   Assessment and Plan: Principal Problem:   TIA (transient ischemic attack) Active Problems:    Hyperlipidemia associated with type 2 diabetes mellitus ([place])   Essential hypertension   Chronic kidney disease (CKD), stage III (moderate) ([place])   Uncontrolled type 2 diabetes mellitus with hyperglycemia ([place])   Depression   DNR (do not resuscitate)    TIA -Patient presenting with transient aphasia and left-sided facial numbness/tingling; symptoms resolved completely within about 30 minutes -Concerning for TIA -Aspirin has been given  to reduce stroke mortality and decrease morbidity -Will place in observation status for CVA/TIA evaluation -Telemetry monitoring -MRI -Echo -Risk stratification with A1c -Patient will need DAPT for 21 days when ABCD2 score is at least 4 and NIH score is 3 or less, and then can transition to monotherapy with a single antiplatelet agent.  Since this patient has failed primary prevention with ASA, transition to Plavix appears to be reasonable.  Will defer to neurology for now. -Neurology consult -PT/OT/ST/Nutrition Consults  HTN -Allow permissive HTN for now -Treat BP only if >220/120, and then with goal of 15% reduction -Hold amlodipine, lisinopril, Toprol XL and plan to restart in 48-72 hours   HLD -Recent FLP with LDL 100 -Continue Lipitor but increase from 40 to 80 mg daily for LDL goal <70   DM -Will check A1c -Hold home PO medication (amaryl) -Continue Levemir -Will order moderate-scale SSI  Stage 3b CKD -Slightly worse than baseline -Attempt to avoid nephrotoxic medications -Recheck BMP in AM   Depression/anxiety  -Continue Klonopin  DNR -I have discussed code status with the patient and her daughter and  they are in agreement that the patient would not desire resuscitation and would prefer to die a natural death should that situation arise. -She will need a gold out of facility DNR form at the time of discharge      Advance Care Planning:   Code Status: DNR   Consults: Neurology; PT/OT/ST; nutrition; TOC team  DVT  Prophylaxis: Lovenox  Family Communication: Daughter was present throughout evaluation  Severity of Illness: The appropriate patient status for this patient is OBSERVATION. Observation status is judged to be reasonable and necessary in order to provide the required intensity of service to ensure the patient's safety. The patient's presenting symptoms, physical exam findings, and initial radiographic and laboratory data in the context of their medical condition is felt to place them at decreased risk for further clinical deterioration. Furthermore, it is anticipated that the patient will be medically stable for discharge from the hospital within 2 midnights of admission.   Author: Karmen Bongo, MD 01/04/2022 6:40 PM  For on call review [email or web address].

## 2022-01-04 NOTE — ED Notes (Addendum)
Called EKG department to troubleshoot EKG monitor, unable to shoot at this time

## 2022-01-04 NOTE — Code Documentation (Signed)
Stroke Response Nurse Documentation Code Documentation  Sandra [name] is a 88 y.o. female arriving to [facility]  via [facility] on 01-04-22 with past medical hx of MI, CKD, DM, HTN, HA, HLD, SAH, TIA, ICH w/SDH 2021, cognitive impairment. On No antithrombotic. Code stroke was activated by EMS.   Patient eating at [employer] with family for early birthday celebration when suddenly was unable to speak and left facial droop.  Stroke team at the bedside on patient arrival. Labs drawn and patient cleared for CT by EDP. Patient to CT with team. NIHSS 3, see documentation for details and code stroke times. Patient with disoriented, Expressive aphasia , and dysarthria  on exam.   The following imaging was completed:  CT Head. Patient is not a candidate for IV Thrombolytic due to prior ICH. Patient is not a candidate for IR due to no LVO.   Care Plan: q2h VS and mNIHSS.   Bedside handoff with ED RN Vikki Ports.    Candace Cruise K  Stroke Response RN

## 2022-01-05 ENCOUNTER — Observation Stay (HOSPITAL_BASED_OUTPATIENT_CLINIC_OR_DEPARTMENT_OTHER): Payer: Medicare HMO

## 2022-01-05 ENCOUNTER — Other Ambulatory Visit: Payer: Self-pay

## 2022-01-05 DIAGNOSIS — G459 Transient cerebral ischemic attack, unspecified: Secondary | ICD-10-CM | POA: Diagnosis not present

## 2022-01-05 LAB — BASIC METABOLIC PANEL
Anion gap: 6 (ref 5–15)
BUN: 37 mg/dL — ABNORMAL HIGH (ref 8–23)
CO2: 22 mmol/L (ref 22–32)
Calcium: 9.4 mg/dL (ref 8.9–10.3)
Chloride: 113 mmol/L — ABNORMAL HIGH (ref 98–111)
Creatinine, Ser: 1.83 mg/dL — ABNORMAL HIGH (ref 0.44–1.00)
GFR, Estimated: 27 mL/min — ABNORMAL LOW (ref >60)
Glucose, Bld: 140 mg/dL — ABNORMAL HIGH (ref 70–99)
Potassium: 4.3 mmol/L (ref 3.5–5.1)
Sodium: 141 mmol/L (ref 135–145)

## 2022-01-05 LAB — LIPID PANEL
Cholesterol: 142 mg/dL (ref 0–200)
HDL: 34 mg/dL — ABNORMAL LOW (ref >40)
LDL Cholesterol: 59 mg/dL (ref 0–99)
Total CHOL/HDL Ratio: 4.2 RATIO
Triglycerides: 244 mg/dL — ABNORMAL HIGH (ref <150)
VLDL: 49 mg/dL — ABNORMAL HIGH (ref 0–40)

## 2022-01-05 LAB — ECHOCARDIOGRAM COMPLETE
Area-P 1/2: 3.1 cm2
Height: 60 in
S' Lateral: 2.4 cm
Weight: 2162.27 oz

## 2022-01-05 LAB — CBG MONITORING, ED
Glucose-Capillary: 190 mg/dL — ABNORMAL HIGH (ref 70–99)
Glucose-Capillary: 121 mg/dL — ABNORMAL HIGH (ref 70–99)

## 2022-01-05 LAB — GLUCOSE, CAPILLARY: Glucose-Capillary: 97 mg/dL (ref 70–99)

## 2022-01-05 MED ORDER — ENOXAPARIN SODIUM 30 MG/0.3ML IJ SOSY
30.0000 mg | PREFILLED_SYRINGE | INTRAMUSCULAR | Status: DC
  Administered 2022-01-05: 30 mg via SUBCUTANEOUS
  Filled 2022-01-05: qty 0.3

## 2022-01-05 MED ORDER — OXYCODONE HCL 5 MG PO TABS
5.0000 mg | ORAL_TABLET | Freq: Four times a day (QID) | ORAL | Status: DC | PRN
  Administered 2022-01-05: 5 mg via ORAL
  Filled 2022-01-05: qty 1

## 2022-01-05 MED ORDER — HALOPERIDOL LACTATE 5 MG/ML IJ SOLN
5.0000 mg | Freq: Once | INTRAMUSCULAR | Status: AC | PRN
  Administered 2022-01-05: 5 mg via INTRAMUSCULAR
  Filled 2022-01-05: qty 1

## 2022-01-05 MED ORDER — INSULIN DETEMIR 100 UNIT/ML ~~LOC~~ SOLN
15.0000 [IU] | Freq: Every day | SUBCUTANEOUS | Status: DC
  Administered 2022-01-05: 15 [IU] via SUBCUTANEOUS
  Filled 2022-01-05 (×2): qty 0.15

## 2022-01-05 MED ORDER — CLONAZEPAM 0.5 MG PO TBDP
0.5000 mg | ORAL_TABLET | Freq: Two times a day (BID) | ORAL | Status: DC | PRN
  Administered 2022-01-05: 0.5 mg via ORAL
  Filled 2022-01-05: qty 1

## 2022-01-05 MED ORDER — HALOPERIDOL LACTATE 5 MG/ML IJ SOLN: 5.0000 mg | Freq: Once | INTRAMUSCULAR | Status: AC | PRN

## 2022-01-05 NOTE — Progress Notes (Signed)
PT Cancellation Note  Patient Details Name: Sandra [name] MRN: [id number] DOB: 05-09-1933   Cancelled Treatment:    Reason Eval/Treat Not Completed: Other (comment)  Checked on pt and RN giving her pain meds.  Pt reports not feeling like therapy now and family reports pt anxious but was walking earlier.  Returned about an hour later and pt soundly sleeping.  As family reports pt had been anxious and she is now resting - did not wake pt.  Will f/u in afternoon as able. Abran Richard, PT Acute Rehab [facility] [phone]  Karlton Lemon 01/05/2022, 12:25 PM

## 2022-01-05 NOTE — Progress Notes (Signed)
PT Cancellation Note  Patient Details Name: Sandra [name] MRN: [id number] DOB: March 23, 1933   Cancelled Treatment:    Reason Eval/Treat Not Completed: Patient at procedure or test/unavailable Pt having Echo, will f/u as able. Abran Richard, PT Acute Rehab [facility] [phone]   Karlton Lemon 01/05/2022, 2:22 PM

## 2022-01-05 NOTE — Evaluation (Signed)
Physical Therapy Evaluation Patient Details Name: Sandra [name] MRN: [id number] DOB: [birth year] Today's Date: 01/05/2022  History of Present Illness  Sandra [name] is a 88 y.o. female was at the [place] attempting to order a drink when she suddenly developed inability to speak, with left facial droop.  She was admitted 01/04/22. MRI shows no acute intracranial process. No evidence of acute or subacute infarct. Redemonstrated remote hemorrhage in the right temporal and occipital lobe, with additional chronic hemorrhagic foci and sequela of small vessel ischemic disease.  Past medical history including but not limited to MI, CKD, DM, HTN, HA, HLD, SAH, TIA, ICH w/SDH 2021, and cognitive impairment.  Clinical Impression  Pt admitted with above diagnosis. At baseline, pt is ambulatory without AD and lives with daughter.  Family was not present during PT eval but were during OT eval and provided hx and understanding that pt will need near 24 hr supervision at d/c due to decreased cognition and safety awareness.  Pt was min A for bed mobility and to ambulate 54' with PT without AD. She was confused, anxious, fidgeting with lines but able to be redirected and followed simple commands.  Pt currently with functional limitations due to the deficits listed below (see PT Problem List). Pt will benefit from skilled PT to increase their independence and safety with mobility to allow discharge to the venue listed below.          Recommendations for follow up therapy are one component of a multi-disciplinary discharge planning process, led by the attending physician.  Recommendations may be updated based on patient status, additional functional criteria and insurance authorization.  Follow Up Recommendations Home health PT      Assistance Recommended at Discharge Frequent or constant Supervision/Assistance  Patient can return home with the following  A little help with walking and/or transfers;A little  help with bathing/dressing/bathroom;Assistance with cooking/housework;Help with stairs or ramp for entrance    Equipment Recommendations None recommended by PT  Recommendations for Other Services       Functional Status Assessment Patient has had a recent decline in their functional status and demonstrates the ability to make significant improvements in function in a reasonable and predictable amount of time.     Precautions / Restrictions Precautions Precautions: Fall Restrictions Weight Bearing Restrictions: No      Mobility  Bed Mobility Overal bed mobility: Needs Assistance Bed Mobility: Supine to Sit, Sit to Supine     Supine to sit: Min assist Sit to supine: Min assist   General bed mobility comments: Min A to lift trunk to sit; and min A to get legs back in bed    Transfers Overall transfer level: Needs assistance Equipment used: 1 person hand held assist Transfers: Sit to/from Stand Sit to Stand: Min assist, From elevated surface           General transfer comment: Min A to stand from elevated stretcher    Ambulation/Gait Ambulation/Gait assistance: Min assist Gait Distance (Feet): 80 Feet Assistive device: None Gait Pattern/deviations: Step-to pattern, Decreased stride length Gait velocity: decreased     General Gait Details: Small steps with mild instability requiring min A for balance occasionally; frequent cues to avoid objects in hallway (both sides)  Stairs            Wheelchair Mobility    Modified Rankin (Stroke Patients Only)       Balance Overall balance assessment: Needs assistance Sitting-balance support: Feet supported, No upper extremity supported Sitting balance-Leahy Scale:  Fair     Standing balance support: No upper extremity supported Standing balance-Leahy Scale: Fair Standing balance comment: Pt able to static stand without support but needing min A to ambulate                             Pertinent  Vitals/Pain Pain Assessment Pain Assessment: No/denies pain    Home Living Family/patient expects to be discharged to:: Private residence Living Arrangements: Children Available Help at Discharge: Available 24 hours/day Type of Home: House Home Access: Stairs to enter Entrance Stairs-Rails: None Entrance Stairs-Number of Steps: 2   Home Layout: One level Home Equipment: Conservation officer, nature (2 wheels);Cane - single point;Shower seat Additional Comments: daughter works evenings    Prior Function Prior Level of Function : Independent/Modified Independent;Driving             Mobility Comments: Pt was ambulatory without AD ; able to ambulate in community ADLs Comments: Per son she was able to complete all of her ADLs as well as some cooking and cleaning.  She was also paying her bills.     Hand Dominance   Dominant Hand: Right    Extremity/Trunk Assessment   Upper Extremity Assessment Upper Extremity Assessment: Defer to OT evaluation    Lower Extremity Assessment Lower Extremity Assessment: Generalized weakness;Difficult to assess due to impaired cognition (Strength appears functional, no focal deficits but pt not following commands for further testing)    Cervical / Trunk Assessment Cervical / Trunk Assessment: Normal  Communication   Communication: No difficulties  Cognition Arousal/Alertness: Awake/alert Behavior During Therapy: Impulsive, Anxious Overall Cognitive Status: Impaired/Different from baseline Area of Impairment: Orientation, Attention, Memory, Safety/judgement, Awareness, Following commands, Problem solving                 Orientation Level: Place, Time, Situation Current Attention Level: Sustained Memory: Decreased short-term memory, Decreased recall of precautions Following Commands: Follows one step commands inconsistently Safety/Judgement: Decreased awareness of safety, Decreased awareness of deficits Awareness: Intellectual Problem Solving:  Slow processing, Requires verbal cues, Requires tactile cues General Comments: Pt fidgeting with lines, confused, anxious        General Comments General comments (skin integrity, edema, etc.): Family not present during therapy session - obtained hx from OT note    Exercises     Assessment/Plan    PT Assessment Patient needs continued PT services  PT Problem List Decreased strength;Decreased cognition;Decreased knowledge of use of DME;Decreased activity tolerance;Decreased safety awareness;Decreased balance;Decreased knowledge of precautions;Decreased mobility;Decreased coordination       PT Treatment Interventions DME instruction;Modalities;Gait training;Stair training;Cognitive remediation;Functional mobility training;Therapeutic activities;Therapeutic exercise;Patient/family education;Balance training    PT Goals (Current goals can be found in the Care Plan section)  Acute Rehab PT Goals Patient Stated Goal: go home PT Goal Formulation: With patient Time For Goal Achievement: 01/19/22 Potential to Achieve Goals: Good    Frequency Min 3X/week     Co-evaluation               AM-PAC PT "6 Clicks" Mobility  Outcome Measure Help needed turning from your back to your side while in a flat bed without using bedrails?: A Little Help needed moving from lying on your back to sitting on the side of a flat bed without using bedrails?: A Little Help needed moving to and from a bed to a chair (including a wheelchair)?: A Little Help needed standing up from a chair using your arms (e.g., wheelchair or  bedside chair)?: A Little Help needed to walk in hospital room?: A Little Help needed climbing 3-5 steps with a railing? : A Lot 6 Click Score: 17    End of Session Equipment Utilized During Treatment: Gait belt Activity Tolerance: Patient tolerated treatment well Patient left: in bed;with call bell/phone within reach (in ED on stretcher) Nurse Communication: Mobility status PT  Visit Diagnosis: Unsteadiness on feet (R26.81);Muscle weakness (generalized) (M62.81)    Time: 3361-2244 PT Time Calculation (min) (ACUTE ONLY): 13 min   Charges:   PT Evaluation $PT Eval Low Complexity: 1 Low          Clennon Nasca, PT Acute Rehab [facility] [phone]   Karlton Lemon 01/05/2022, 5:08 PM

## 2022-01-05 NOTE — ED Notes (Signed)
Pt is resting comfortably at this time. Pt has equal chest rise and fall. No acute distress noted at this time. Patient declines to be hooked up to monitor, patient removed all monitoring equipment.

## 2022-01-05 NOTE — ED Notes (Signed)
Pt found walking around in the room, ripped IV out, ripped all wires off and threw them in the floor. Pt appears very anxious and tearful. Pt is very upset with all the noises and says she is leaving. Pt encouraged to stay for proper care. Provider made aware and new orders obtained.

## 2022-01-05 NOTE — ED Notes (Signed)
Patient and family out to desk multiple times asking to leave. Paged MD, per MD patient is able to leave AMA. Family and patient informed of this, patient and family declined to sign out AMA and will be staying.

## 2022-01-05 NOTE — Progress Notes (Signed)
SLP Cancellation Note  Patient Details Name: Sandra [name] MRN: [id number] DOB: 02/25/1933   Cancelled treatment:       Reason Eval/Treat Not Completed: SLP screened, no needs identified, will sign off  Pt passed [facility] swallow screen 12/17 and pt's RN denied observance of any symptoms of dysphagia; therefore, no formalized SLP swallow eval is needed per protocol. Pt's MRI was negative for acute changes and pt's RN denied observance of any deficits in speech, language, or cognition. SLP will sign off.    Emmett Bracknell I. Hardin Negus, [place], [place] Office number [phone]  Horton Marshall 01/05/2022, 1:00 PM

## 2022-01-05 NOTE — Progress Notes (Signed)
PROGRESS NOTE    Sandra [name]  VWU:[id number] DOB: Jun 21, 1933 DOA: 01/04/2022 PCP: Faustino Congress, NP    Brief Narrative:   Sandra [name] is a 88 y.o. female with past medical history significant for depression/anxiety, CKD stage IIIb, type 2 diabetes mellitus, essential hypertension, hyperlipidemia, ILD, hx ICH, scleroderma who presented to [facility] ED via EMS with confusion, facial droop.  Patient was at [employer] celebrating her birthday with a large amount of family in which she stated her upper lip started tingling followed by inability to talk.  EMS was activated and patient started improving in the ambulance prior to arrival to the hospital.  In the ED, temperature 98.2 F, HR 75, RR 16, BP 165/61, SpO2 100% on room air.  WBC 8.1, hemoglobin 11.5, platelets 240.  Sodium 139, potassium 4.7, chloride 111, CO2 21, glucose 146, BUN 44, creatinine 1.97.  INR 1.0.  EtOH level less than 10.  CT head without contrast with no acute intracranial normality or significant interval change, stable remote right occipital lobe infarct, stable moderate diffuse white matter disease likely sequela of chronic microvascular ischemia.  Neurology was consulted.  TRH consulted for further evaluation and management for concern of acute CVA versus TIA.  Assessment & Plan:   TIA Hx CVA Patient presenting to ED with acute onset facial droop and inability to talk.  On arrival to the ED CT head without contrast with no acute intracranial malady or significant interval change, stable remote right occipital lobe infarct, stable moderate diffuse white matter disease.  MR brain without contrast with no acute intracranial process, no evidence of acute or subacute infarct, redemonstrated remote hemorrhage right temporal and occipital lobe with additional chronic hemorrhagic foci and sequelae of small vessel ischemic disease. -- Neurology following, appreciate assistance -- LDL 59 -- Hemoglobin A1c: Pending -- TTE:  Pending -- PT/OT: Recommend Home health -- SLP: No needs identified -- Aspirin 81 mg p.o. daily, Plavix 75 mg p.o. daily -- Atorvastatin 80 mg p.o. daily -- Continue monitor on telemetry  Type 2 diabetes mellitus, with hyperglycemia Hemoglobin A1c 10.0 03/18/2021, poorly controlled.  Likely secondary to dietary indiscretions.  Home regimen includes Levemir -- Hemoglobin A1c: Pending -- Holding home glimepiride -- Levemir 33 units Nambe daily -- Moderate SSI for coverage -- CBG before every meal/at bedtime  Essential hypertension Home regimen includes amlodipine 5 mg p.o. daily, lisinopril 5 mg p.o. daily, metoprolol succinate 50 mg p.o. daily. -- Hold home antihypertensives for now  Hyperlipidemia Lipid panel with total cholesterol 142, HDL 34, LDL 59, triglycerides 244. --Atorvastatin 80 mg p.o. daily  CKD stage IIIb Baseline creatinine 1.7-1.8; stable. --Avoid nephrotoxins, renally dose all medications   DVT prophylaxis: enoxaparin (LOVENOX) injection 30 mg Start: 01/05/22 2200    Code Status: DNR Family Communication: Updated patient's daughter and son at bedside this morning  Disposition Plan:  Level of care: Telemetry Medical Status is: Observation The patient remains OBS appropriate and will d/c before 2 midnights.    Consultants:  Neurology  Procedures:  TTE: Pending  Antimicrobials:  None   Subjective: Patient seen examined bedside, resting calmly.  Anxious, walking around the room earlier per RN.  Daughter and son present at bedside.  Initially was requesting to leave but will now stay for complete medical workup.  No other specific questions or concerns at this time.  Denies headache, no dizziness, no visual changes, no chest pain, no palpitations, no shortness of breath, no fever/chills/night sweats, no nausea/vomiting/diarrhea, no focal weakness, no fatigue,  no paresthesias.  No acute events overnight per nursing staff.  Objective: Vitals:   01/05/22 1526  01/05/22 1600 01/05/22 1653 01/05/22 1730  BP: (!) 148/61 (!) 131/52 (!) 145/64 (!) 140/58  Pulse: 87 76 83 77  Resp: 18 19 (!) 22 18  Temp:   98.2 F (36.8 C) 98 F (36.7 C)  TempSrc:    Oral  SpO2: 98% 93% 96% 100%  Weight:      Height:       No intake or output data in the 24 hours ending 01/05/22 1749 Filed Weights   01/04/22 1300 01/04/22 2108  Weight: 61.2 kg 61.3 kg    Examination:  Physical Exam: GEN: NAD, alert and oriented x 3, elderly in appearance HEENT: NCAT, PERRL, EOMI, sclera clear, MMM PULM: CTAB w/o wheezes/crackles, normal respiratory effort, on room air CV: RRR w/o M/G/R GI: abd soft, NTND, NABS, no R/G/M MSK: no peripheral edema, muscle strength globally intact 5/5 bilateral upper/lower extremities NEURO: CN II-XII intact, no focal deficits, sensation to light touch intact PSYCH: normal mood/affect Integumentary: dry/intact, no rashes or wounds    Data Reviewed: I have personally reviewed following labs and imaging studies  CBC: Recent Labs  Lab 01/04/22 1313 01/04/22 1317  WBC  --  8.1  NEUTROABS  --  4.9  HGB 11.2* 11.5*  HCT 33.0* 34.9*  MCV  --  97.8  PLT  --  132   Basic Metabolic Panel: Recent Labs  Lab 01/04/22 1313 01/04/22 1317 01/05/22 0735  NA 142 139 141  K 4.8 4.7 4.3  CL 110 111 113*  CO2  --  21* 22  GLUCOSE 145* 146* 140*  BUN 45* 44* 37*  CREATININE 2.10* 1.97* 1.83*  CALCIUM  --  9.5 9.4   GFR: Estimated Creatinine Clearance: 18 mL/min (A) (by C-G formula based on SCr of 1.83 mg/dL (H)). Liver Function Tests: Recent Labs  Lab 01/04/22 1317  AST 20  ALT 15  ALKPHOS 75  BILITOT 0.6  PROT 7.0  ALBUMIN 3.8   No results for input(s): "LIPASE", "AMYLASE" in the last 168 hours. No results for input(s): "AMMONIA" in the last 168 hours. Coagulation Profile: Recent Labs  Lab 01/04/22 1317  INR 1.0   Cardiac Enzymes: No results for input(s): "CKTOTAL", "CKMB", "CKMBINDEX", "TROPONINI" in the last 168  hours. BNP (last 3 results) No results for input(s): "PROBNP" in the last 8760 hours. HbA1C: No results for input(s): "HGBA1C" in the last 72 hours. CBG: Recent Labs  Lab 01/04/22 1307 01/04/22 2206 01/05/22 1119 01/05/22 1649  GLUCAP 139* 180* 190* 121*   Lipid Profile: Recent Labs    01/05/22 0826  CHOL 142  HDL 34*  LDLCALC 59  TRIG 244*  CHOLHDL 4.2   Thyroid Function Tests: No results for input(s): "TSH", "T4TOTAL", "FREET4", "T3FREE", "THYROIDAB" in the last 72 hours. Anemia Panel: No results for input(s): "VITAMINB12", "FOLATE", "FERRITIN", "TIBC", "IRON", "RETICCTPCT" in the last 72 hours. Sepsis Labs: No results for input(s): "PROCALCITON", "LATICACIDVEN" in the last 168 hours.  No results found for this or any previous visit (from the past 240 hour(s)).       Radiology Studies: ECHOCARDIOGRAM COMPLETE  Result Date: 01/05/2022    ECHOCARDIOGRAM REPORT   Patient Name:   JAYLEE FREEZE Date of Exam: 01/05/2022 Medical Rec #:  [id number]     Height:       60.0 in Accession #:    [id number]    Weight:  135.1 lb Date of Birth:  22-Nov-1933    BSA:          1.580 m Patient Age:    82 years      BP:           131/52 mmHg Patient Gender: F             HR:           69 bpm. Exam Location:  Inpatient Procedure: 2D Echo, Color Doppler and Cardiac Doppler Indications:    TIA G45.9  History:        Patient has prior history of Echocardiogram examinations, most                 recent 03/18/2021. Cardiomyopathy, TIA; Risk                 Factors:Dyslipidemia, Hypertension, Diabetes and Non-Smoker.  Sonographer:    Greer Pickerel Referring Phys: [id number] JENNIFER YATES  Sonographer Comments: Suboptimal subcostal window. Image acquisition challenging due to patient body habitus and Image acquisition challenging due to respiratory motion. IMPRESSIONS  1. Left ventricular ejection fraction, by estimation, is 60 to 65%. The left ventricle has normal function. The left ventricle has no  regional wall motion abnormalities. Left ventricular diastolic parameters are consistent with Grade I diastolic dysfunction (impaired relaxation).  2. Right ventricular systolic function is normal. The right ventricular size is normal. There is normal pulmonary artery systolic pressure. The estimated right ventricular systolic pressure is 29.5 mmHg.  3. Left atrial size was mildly dilated.  4. The mitral valve is grossly normal. Trivial mitral valve regurgitation. No evidence of mitral stenosis.  5. The aortic valve is tricuspid. There is mild calcification of the aortic valve. There is mild thickening of the aortic valve. Aortic valve regurgitation is not visualized. Aortic valve sclerosis is present, with no evidence of aortic valve stenosis.  6. The inferior vena cava is normal in size with greater than 50% respiratory variability, suggesting right atrial pressure of 3 mmHg. Comparison(s): No significant change from prior study. FINDINGS  Left Ventricle: Left ventricular ejection fraction, by estimation, is 60 to 65%. The left ventricle has normal function. The left ventricle has no regional wall motion abnormalities. The left ventricular internal cavity size was normal in size. There is  no left ventricular hypertrophy. Left ventricular diastolic parameters are consistent with Grade I diastolic dysfunction (impaired relaxation). Right Ventricle: The right ventricular size is normal. No increase in right ventricular wall thickness. Right ventricular systolic function is normal. There is normal pulmonary artery systolic pressure. The tricuspid regurgitant velocity is 1.71 m/s, and  with an assumed right atrial pressure of 3 mmHg, the estimated right ventricular systolic pressure is 62.1 mmHg. Left Atrium: Left atrial size was mildly dilated. Right Atrium: Right atrial size was normal in size. Pericardium: Trivial pericardial effusion is present. Presence of epicardial fat layer. Mitral Valve: The mitral valve is  grossly normal. Trivial mitral valve regurgitation. No evidence of mitral valve stenosis. Tricuspid Valve: The tricuspid valve is grossly normal. Tricuspid valve regurgitation is trivial. No evidence of tricuspid stenosis. Aortic Valve: The aortic valve is tricuspid. There is mild calcification of the aortic valve. There is mild thickening of the aortic valve. Aortic valve regurgitation is not visualized. Aortic valve sclerosis is present, with no evidence of aortic valve stenosis. Pulmonic Valve: The pulmonic valve was grossly normal. Pulmonic valve regurgitation is trivial. No evidence of pulmonic stenosis. Aorta: The aortic root and ascending aorta are  structurally normal, with no evidence of dilitation. Venous: The inferior vena cava is normal in size with greater than 50% respiratory variability, suggesting right atrial pressure of 3 mmHg. IAS/Shunts: The atrial septum is grossly normal.  LEFT VENTRICLE PLAX 2D LVIDd:         3.60 cm   Diastology LVIDs:         2.40 cm   LV e' medial:    4.68 cm/s LV PW:         0.80 cm   LV E/e' medial:  21.2 LV IVS:        0.70 cm   LV e' lateral:   6.20 cm/s LVOT diam:     1.60 cm   LV E/e' lateral: 16.0 LV SV:         41 LV SV Index:   26 LVOT Area:     2.01 cm  RIGHT VENTRICLE RV S prime:     14.90 cm/s TAPSE (M-mode): 2.0 cm LEFT ATRIUM           Index        RIGHT ATRIUM           Index LA diam:      2.50 cm 1.58 cm/m   RA Area:     11.50 cm LA Vol (A4C): 67.1 ml 42.46 ml/m  RA Volume:   26.40 ml  16.71 ml/m  AORTIC VALVE LVOT Vmax:   75.10 cm/s LVOT Vmean:  47.600 cm/s LVOT VTI:    0.204 m  AORTA Ao Root diam: 3.00 cm Ao Asc diam:  3.20 cm MITRAL VALVE                TRICUSPID VALVE MV Area (PHT): 3.10 cm     TR Peak grad:   11.7 mmHg MV Decel Time: 245 msec     TR Vmax:        171.00 cm/s MV E velocity: 99.40 cm/s MV A velocity: 118.00 cm/s  SHUNTS MV E/A ratio:  0.84         Systemic VTI:  0.20 m                             Systemic Diam: 1.60 cm Eleonore Chiquito  MD Electronically signed by Eleonore Chiquito MD Signature Date/Time: 01/05/2022/4:51:39 PM    Final    MR BRAIN WO CONTRAST  Result Date: 01/04/2022 CLINICAL DATA:  Stroke, follow-up EXAM: MRI HEAD WITHOUT CONTRAST TECHNIQUE: Multiplanar, multiecho pulse sequences of the brain and surrounding structures were obtained without intravenous contrast. COMPARISON:  03/17/2021 MRI head, correlation is also made with 01/04/2022 CT head FINDINGS: Brain: No restricted diffusion to suggest acute or subacute infarct.No acute hemorrhage, mass, mass effect, or midline shift. No hydrocephalus or extra-axial collection. Redemonstrated chronic hemorrhage in the posterior right temporal and lateral right occipital lobe, with additional chronic hemorrhagic foci in the left posterior temporal and occipital lobe, bilateral frontal lobes, and right parietal lobe; these appear unchanged compared to the susceptibility weighted sequence from 08/29/2019, with the exception of some likely remote subarachnoid hemorrhage in the occipital lobes (series 7, image 40). Vascular: Patent arterial flow voids. Skull and upper cervical spine: Normal marrow signal. Sinuses/Orbits: Mucosal thickening in the left greater than right maxillary sinus and bilateral ethmoid air cells.Status post bilateral lens replacements. Other: Trace fluid in the mastoid air cells. IMPRESSION: 1. No acute intracranial process. No evidence of acute or subacute infarct. 2.  Redemonstrated remote hemorrhage in the right temporal and occipital lobe, with additional chronic hemorrhagic foci and sequela of small vessel ischemic disease. Electronically Signed   By: Merilyn Baba M.D.   On: 01/04/2022 23:51   CT HEAD CODE STROKE WO CONTRAST  Result Date: 01/04/2022 CLINICAL DATA:  Code stroke. Neuro deficit, acute, stroke suspected. EXAM: CT HEAD WITHOUT CONTRAST TECHNIQUE: Contiguous axial images were obtained from the base of the skull through the vertex without intravenous  contrast. RADIATION DOSE REDUCTION: This exam was performed according to the departmental dose-optimization program which includes automated exposure control, adjustment of the mA and/or kV according to patient size and/or use of iterative reconstruction technique. COMPARISON:  CT head without contrast 03/17/2021. MR head without contrast 03/17/2021. FINDINGS: Brain: Remote right occipital lobe infarct is stable. Moderate diffuse white matter disease is similar the prior studies. No acute infarct, hemorrhage, or mass lesion is present. Basal ganglia are intact. Insular ribbon is normal. No acute cortical abnormality is present. The ventricles are proportionate to the degree of atrophy. No significant extraaxial fluid collection is present. The brainstem and cerebellum are within normal limits. Vascular: Atherosclerotic calcifications are present within the cavernous internal carotid arteries bilaterally. No hyperdense vessel is present. Skull: Calvarium is intact. No focal lytic or blastic lesions are present. No significant extracranial soft tissue lesion is present. Sinuses/Orbits: The paranasal sinuses and mastoid air cells are clear. The globes and orbits are within normal limits. ASPECTS [facility] Stroke Program Early CT Score) - Ganglionic level infarction (caudate, lentiform nuclei, internal capsule, insula, M1-M3 cortex): 7/7 - Supraganglionic infarction (M4-M6 cortex): 3/3 Total score (0-10 with 10 being normal): 10/10 IMPRESSION: 1. No acute intracranial abnormality or significant interval change. 2. Stable remote right occipital lobe infarct. 3. Stable moderate diffuse white matter disease. This likely reflects the sequela of chronic microvascular ischemia. 4. Aspects is 10/10. The above was relayed via text pager to DR. Kendrick Remigio LINDZEN on 01/04/2022 at 13:33 . Electronically Signed   By: San Morelle M.D.   On: 01/04/2022 13:34        Scheduled Meds:   stroke: early stages of recovery book    Does not apply Once   aspirin EC  81 mg Oral Daily   atorvastatin  80 mg Oral QPM   clopidogrel  75 mg Oral Daily   enoxaparin (LOVENOX) injection  30 mg Subcutaneous Q24H   famotidine  40 mg Oral QHS   insulin aspart  0-15 Units Subcutaneous TID WC   insulin aspart  0-5 Units Subcutaneous QHS   insulin detemir  22 Units Subcutaneous QHS   insulin detemir  33 Units Subcutaneous Daily   Continuous Infusions:   LOS: 0 days    Time spent: 51 minutes spent on chart review, discussion with nursing staff, consultants, updating family and interview/physical exam; more than 50% of that time was spent in counseling and/or coordination of care.    Joia Doyle J [place] DO [facility] Available via Epic secure chat 7am-7pm After these hours, please refer to coverage provider listed on [email or web address] 01/05/2022, 5:49 PM

## 2022-01-05 NOTE — Progress Notes (Addendum)
STROKE TEAM PROGRESS NOTE   INTERVAL HISTORY Patient was seen walking around the room. Her son was present in the room. She had notable resting tremors in bilateral UE but no focal neurologic deficits. MRI scan of the head is negative for acute infarct. Vitals:   01/05/22 0431 01/05/22 0500 01/05/22 0700 01/05/22 1116  BP: (!) 131/112 (!) 137/125 (!) 151/66 (!) 131/52  Pulse: 64 74 85 75  Resp: '19  20 18  '$ Temp: (!) 97.5 F (36.4 C)     TempSrc: Oral     SpO2: 99% 94% 98% 96%  Weight:      Height:       CBC:  Recent Labs  Lab 01/04/22 1313 01/04/22 1317  WBC  --  8.1  NEUTROABS  --  4.9  HGB 11.2* 11.5*  HCT 33.0* 34.9*  MCV  --  97.8  PLT  --  400   Basic Metabolic Panel:  Recent Labs  Lab 01/04/22 1317 01/05/22 0735  NA 139 141  K 4.7 4.3  CL 111 113*  CO2 21* 22  GLUCOSE 146* 140*  BUN 44* 37*  CREATININE 1.97* 1.83*  CALCIUM 9.5 9.4   Lipid Panel:  Recent Labs  Lab 01/05/22 0826  CHOL 142  TRIG 244*  HDL 34*  CHOLHDL 4.2  VLDL 49*  LDLCALC 59   HgbA1c: No results for input(s): "HGBA1C" in the last 168 hours. Urine Drug Screen: No results for input(s): "LABOPIA", "COCAINSCRNUR", "LABBENZ", "AMPHETMU", "THCU", "LABBARB" in the last 168 hours.  Alcohol Level  Recent Labs  Lab 01/04/22 1317  ETH <10    IMAGING past 24 hours MR BRAIN WO CONTRAST  Result Date: 01/04/2022 CLINICAL DATA:  Stroke, follow-up EXAM: MRI HEAD WITHOUT CONTRAST TECHNIQUE: Multiplanar, multiecho pulse sequences of the brain and surrounding structures were obtained without intravenous contrast. COMPARISON:  03/17/2021 MRI head, correlation is also made with 01/04/2022 CT head FINDINGS: Brain: No restricted diffusion to suggest acute or subacute infarct.No acute hemorrhage, mass, mass effect, or midline shift. No hydrocephalus or extra-axial collection. Redemonstrated chronic hemorrhage in the posterior right temporal and lateral right occipital lobe, with additional chronic  hemorrhagic foci in the left posterior temporal and occipital lobe, bilateral frontal lobes, and right parietal lobe; these appear unchanged compared to the susceptibility weighted sequence from 08/29/2019, with the exception of some likely remote subarachnoid hemorrhage in the occipital lobes (series 7, image 40). Vascular: Patent arterial flow voids. Skull and upper cervical spine: Normal marrow signal. Sinuses/Orbits: Mucosal thickening in the left greater than right maxillary sinus and bilateral ethmoid air cells.Status post bilateral lens replacements. Other: Trace fluid in the mastoid air cells. IMPRESSION: 1. No acute intracranial process. No evidence of acute or subacute infarct. 2. Redemonstrated remote hemorrhage in the right temporal and occipital lobe, with additional chronic hemorrhagic foci and sequela of small vessel ischemic disease. Electronically Signed   By: Merilyn Baba M.D.   On: 01/04/2022 23:51   CT HEAD CODE STROKE WO CONTRAST  Result Date: 01/04/2022 CLINICAL DATA:  Code stroke. Neuro deficit, acute, stroke suspected. EXAM: CT HEAD WITHOUT CONTRAST TECHNIQUE: Contiguous axial images were obtained from the base of the skull through the vertex without intravenous contrast. RADIATION DOSE REDUCTION: This exam was performed according to the departmental dose-optimization program which includes automated exposure control, adjustment of the mA and/or kV according to patient size and/or use of iterative reconstruction technique. COMPARISON:  CT head without contrast 03/17/2021. MR head without contrast 03/17/2021. FINDINGS: Brain: Remote  right occipital lobe infarct is stable. Moderate diffuse white matter disease is similar the prior studies. No acute infarct, hemorrhage, or mass lesion is present. Basal ganglia are intact. Insular ribbon is normal. No acute cortical abnormality is present. The ventricles are proportionate to the degree of atrophy. No significant extraaxial fluid collection  is present. The brainstem and cerebellum are within normal limits. Vascular: Atherosclerotic calcifications are present within the cavernous internal carotid arteries bilaterally. No hyperdense vessel is present. Skull: Calvarium is intact. No focal lytic or blastic lesions are present. No significant extracranial soft tissue lesion is present. Sinuses/Orbits: The paranasal sinuses and mastoid air cells are clear. The globes and orbits are within normal limits. ASPECTS [facility] Stroke Program Early CT Score) - Ganglionic level infarction (caudate, lentiform nuclei, internal capsule, insula, M1-M3 cortex): 7/7 - Supraganglionic infarction (M4-M6 cortex): 3/3 Total score (0-10 with 10 being normal): 10/10 IMPRESSION: 1. No acute intracranial abnormality or significant interval change. 2. Stable remote right occipital lobe infarct. 3. Stable moderate diffuse white matter disease. This likely reflects the sequela of chronic microvascular ischemia. 4. Aspects is 10/10. The above was relayed via text pager to DR. ERIC LINDZEN on 01/04/2022 at 13:33 . Electronically Signed   By: San Morelle M.D.   On: 01/04/2022 13:34    PHYSICAL EXAM General: well-appearing and in no acute distress HEENT: normocephalic and atraumatic Cardiovascular: regular rate Respiratory: normal respiratory effort and on RA Gastrointestinal: non-tender and non-distended Extremities: moving all extremities spontaneously  Mental Status: Malaina Mortellaro is alert; she is oriented to person, oriented to place, and oriented to situation. Speech was clear and fluent without evidence of aphasia. She was able to follow 3 step commands but with difficulty.  Cranial Nerves: II:  Visual fields  difficult to assess, patient has vision problems and does not follow instructions properly III,IV, VI: no ptosis, extra-ocular motions intact bilaterally V,VII: smile symmetric, facial light touch sensation intact bilaterally XII: midline tongue  extension without atrophy and without fasciculations  Motor: Right : Upper extremity   5/5 full power  Lower extremity   5/5 full power  Left: Upper extremity   5/5 full power Lower extremity   5/5 full power  Tone and bulk: normal tone throughout; no atrophy noted  Sensory: sensation to light touch intact throughout bilaterally  Cerebellar: Finger-to-nose test normal  Gait: normal  ASSESSMENT/PLAN Marley Pakula is a 88 y.o. female with past medical history of MI, CKD, DM, HTN, HA, HLD, SAH, TIA, ICH w/SDH 2021, and cognitive impairment.  she was at the [employer] attempting to order a drink when she suddenly developed inability to speak, with left facial droop. Code stroke was activated by [facility]. CT head negative.  # Right brain subcortical TIA likely from small vessel disease #Remote R occipital lobe infarct Code Stroke CT head no acute intracranial abnormality or significant interval change. Stable remote right occipital lobe infarct. Stable moderate diffuse white matter disease. This likely reflects the sequela of chronic microvascular ischemia. ASPECTS 10.   MRI  shows no acute intracranial process. No evidence of acute or subacute infarct. Redemonstrated remote hemorrhage in the right temporal and occipital lobe, with additional chronic hemorrhagic foci and sequela of small vessel ischemic disease. Carotid Doppler  pending 2D Echo 60 to 65% EF. LDL 102 HgbA1c 10.0 VTE prophylaxis - enoxaparin    Diet   Diet heart healthy/carb modified Room service appropriate? Yes; Fluid consistency: Thin   aspirin 81 mg daily prior to admission, now on aspirin  81 mg daily and clopidogrel 75 mg daily. Continue aspirin 81 mg and clopidogrel 75 mg daily for 21 days, then clopidogrel 75 mg alone daily indefinitely Therapy recommendations: pending evaluation Disposition:  pending medical clearance, stroke workup  Hypertension, chronic Home meds:  amlodipine, lisinopril, held on  admission Stable Permissive hypertension (OK if < 220/120) but gradually normalize in 5-7 days Long-term BP goal normotensive  Hyperlipidemia, chronic Home meds:  atorvastatin 40 mg, increased to 80 mg given LDL not at goal LDL 102, goal < 70 High intensity statin indicated. Continue statin at discharge  Diabetes type II Uncontrolled Home meds:  insulin HgbA1c 10.0 last checked 03/18/2021, recheck pending. Goal < 7.0 CBGs Recent Labs    01/04/22 1307 01/04/22 2206 01/05/22 1119  GLUCAP 139* 180* 190*    SSI  Other Stroke Risk Factors Advanced Age >/= 25  Hx stroke/TIA  Other Active Problems Stage 3b CKD Depression/anxiety   Hospital day # 0   I have personally obtained history,examined this patient, reviewed notes, independently viewed imaging studies, participated in medical decision making and plan of care.ROS completed by me personally and pertinent positives fully documented  I have made any additions or clarifications directly to the above note. Agree with note above.  She presented with transient left facial droop and speech difficulties which appear to have resolved and MRI shows no acute stroke.  Likely TIA due to small vessel disease.  Recommend aspirin and Plavix for 3 weeks followed by Plavix alone and aggressive risk factor modification.  Continue ongoing stroke workup.  Long discussion with patient and family and answered questions.  Greater than 50% time during this 50-minute visit was spent in counseling and coordination of care and discussion with patient and care team and answering questions about stroke and TIA prevention.  Antony Contras, MD Medical Director [facility] St[facility] Center Pager: [phone] 01/05/2022 6:25 PM  To contact Stroke Continuity provider, please refer to [email or web address] After hours, contact General Neurology

## 2022-01-05 NOTE — ED Notes (Signed)
Pt found standing in the room bu the trash can. Pt states she attempted to void in the trash can but missed it. Large amount of urine noted in the floor, trash can also had approximately 300 ml of urine in it. Bladder scan showed 256m in the bladder. In/out cath done and 350 ml of urine collected.

## 2022-01-05 NOTE — ED Notes (Signed)
Patient will not stay in room. Patient is continuing to roam in hallway. Patient redirected back into room.

## 2022-01-05 NOTE — Evaluation (Signed)
Occupational Therapy Evaluation Patient Details Name: Sandra [name] MRN: [id number] DOB: February 22, 1933 Today's Date: 01/05/2022   History of Present Illness Sandra [name] is a 88 y.o. female was at the [employer] attempting to order a drink when she suddenly developed inability to speak, with left facial droop.  MRI shows no acute intracranial process. No evidence of acute or subacute infarct. Redemonstrated remote hemorrhage in the right temporal and occipital lobe, with additional chronic hemorrhagic foci and sequela of small vessel ischemic disease.  Past medical history of MI, CKD, DM, HTN, HA, HLD, SAH, TIA, ICH w/SDH 2021, and cognitive impairment.   Clinical Impression     When therapist entered room noted pt sitting on the floor up toward the head of the stretcher.  Nursing made aware and pt assisted by therapist and NT out of the floor and then back to the bed.  No injuries noted initially and pt reported no discomfort.  Pt with increased confusion, not oriented to place, time, or situation.  Currently at min assist level simulated for selfcare tasks and for transfer back to the bed once standing.  Feel she will benefit from acute care OT to help address cognitive dysfunction and balance deficits.  Per her son who came in at the end of the session, she lives with her daughter but her daughter works.  He understands that she will need 24 hr supervision at home secondary to confusion and decreased safety.  Will continue to follow in acute care.       Recommendations for follow up therapy are one component of a multi-disciplinary discharge planning process, led by the attending physician.  Recommendations may be updated based on patient status, additional functional criteria and insurance authorization.   Follow Up Recommendations  Home health OT     Assistance Recommended at Discharge Frequent or constant Supervision/Assistance  Patient can return home with the following A little help with  walking and/or transfers;A little help with bathing/dressing/bathroom;Assistance with cooking/housework;Direct supervision/assist for financial management;Assist for transportation;Help with stairs or ramp for entrance;Direct supervision/assist for medications management    Functional Status Assessment  Patient has had a recent decline in their functional status and demonstrates the ability to make significant improvements in function in a reasonable and predictable amount of time.  Equipment Recommendations  Other (comment) (TBD)       Precautions / Restrictions Precautions Precautions: Fall Restrictions Weight Bearing Restrictions: No      Mobility Bed Mobility Overal bed mobility: Needs Assistance Bed Mobility: Sit to Supine       Sit to supine: Min assist   General bed mobility comments: Min assist for lifting LEs into the bed and repositioning in the middle.    Transfers Overall transfer level: Needs assistance Equipment used: None Transfers: Sit to/from Stand Sit to Stand: Min assist           General transfer comment: Max +2 for sit to stand from the floor after being found sitting down.  Once standing, min assist to step around to the edge of the stretcher.      Balance Overall balance assessment: Needs assistance Sitting-balance support: Feet supported Sitting balance-Leahy Scale: Fair     Standing balance support: No upper extremity supported Standing balance-Leahy Scale: Poor Standing balance comment: Pt needed assist for transfer around to the bed after standing upright from the floor.  ADL either performed or assessed with clinical judgement   ADL Overall ADL's : Needs assistance/impaired Eating/Feeding: Independent;Sitting Eating/Feeding Details (indicate cue type and reason): simulated Grooming: Wash/dry face;Wash/dry hands;Minimal assistance;Standing Grooming Details (indicate cue type and reason):  simulated Upper Body Bathing: Set up;Sitting Upper Body Bathing Details (indicate cue type and reason): simulated Lower Body Bathing: Sit to/from stand;Minimal assistance Lower Body Bathing Details (indicate cue type and reason): min assist simulated Upper Body Dressing : Supervision/safety;Sitting Upper Body Dressing Details (indicate cue type and reason): simulated Lower Body Dressing: Minimal assistance;Sit to/from stand Lower Body Dressing Details (indicate cue type and reason): simulated Toilet Transfer: Minimal assistance;Ambulation Toilet Transfer Details (indicate cue type and reason): simulated wtihout an assistive device Toileting- Clothing Manipulation and Hygiene: Sit to/from stand;Minimal assistance Toileting - Clothing Manipulation Details (indicate cue type and reason): simulated     Functional mobility during ADLs: Minimal assistance (after standing up from the floor and stepping around to the bed.) General ADL Comments: Limited eval as pt was sitting in the floor when therapist came in. She was not clear on how she ended up there but did state she wanted to go home.  Therapist and NT assisted with getting her up from the floor and stepping around to the bed.  She needed min assist to transition back to supine.  Pt''s son came in at end of session and discussed home setup with therapist as well as knowing she will need 24 hr supervision at discharge secondary to confusion.     Vision Baseline Vision/History: 1 Wears glasses Ability to See in Adequate Light: 0 Adequate Patient Visual Report: No change from baseline Additional Comments: Will continue to assess in treatment            Pertinent Vitals/Pain Pain Assessment Pain Assessment: Faces Pain Score: 0-No pain     Hand Dominance Right   Extremity/Trunk Assessment Upper Extremity Assessment Upper Extremity Assessment: Generalized weakness (resting tremors noted bilaterally)   Lower Extremity Assessment Lower  Extremity Assessment: Defer to PT evaluation   Cervical / Trunk Assessment Cervical / Trunk Assessment: Normal   Communication Communication Communication: No difficulties   Cognition Arousal/Alertness: Awake/alert Behavior During Therapy: Impulsive, Anxious Overall Cognitive Status: Impaired/Different from baseline Area of Impairment: Orientation, Attention, Memory, Safety/judgement, Awareness                 Orientation Level: Place, Time, Situation Current Attention Level: Sustained Memory: Decreased short-term memory, Decreased recall of precautions   Safety/Judgement: Decreased awareness of safety, Decreased awareness of deficits Awareness: Intellectual   General Comments: Pt sitting on the floor at the head of the stretcher.                Home Living Family/patient expects to be discharged to:: Private residence Living Arrangements: Children (daughter lives with her per son) Available Help at Discharge: Available 24 hours/day Type of Home: House Home Access: Stairs to enter Technical brewer of Steps: 2 Entrance Stairs-Rails: None Home Layout: One level     Bathroom Shower/Tub: Teacher, early years/pre: Standard Bathroom Accessibility: Yes   Home Equipment: Conservation officer, nature (2 wheels);Cane - single point;Shower seat   Additional Comments: daughter works evenings      Prior Functioning/Environment Prior Level of Function : Independent/Modified Independent;Driving               ADLs Comments: Per son she was able to complete all of her ADLs as well as some cooking and cleaning.  She was also  paying her bills.        OT Problem List: Decreased cognition;Impaired balance (sitting and/or standing);Decreased safety awareness;Decreased strength;Decreased knowledge of use of DME or AE      OT Treatment/Interventions: Self-care/ADL training;Patient/family education;Balance training;Therapeutic activities;DME and/or AE  instruction;Cognitive remediation/compensation;Neuromuscular education    OT Goals(Current goals can be found in the care plan section) Acute Rehab OT Goals Patient Stated Goal: Pt wants to go home. OT Goal Formulation: With patient/family Time For Goal Achievement: 01/19/22 Potential to Achieve Goals: Good  OT Frequency: Min 2X/week       AM-PAC OT "6 Clicks" Daily Activity     Outcome Measure Help from another person eating meals?: None Help from another person taking care of personal grooming?: A Little Help from another person toileting, which includes using toliet, bedpan, or urinal?: A Little Help from another person bathing (including washing, rinsing, drying)?: A Little Help from another person to put on and taking off regular upper body clothing?: A Little Help from another person to put on and taking off regular lower body clothing?: A Little 6 Click Score: 19   End of Session Nurse Communication: Mobility status;Other (comment) (pt being found down in the floor)  Activity Tolerance: Other (comment) (limited secondary to fall) Patient left: in bed;with call bell/phone within reach;with nursing/sitter in room  OT Visit Diagnosis: Unsteadiness on feet (R26.81);Muscle weakness (generalized) (M62.81);Other symptoms and signs involving cognitive function                Time: 6606-3016 OT Time Calculation (min): 23 min Charges:  OT General Charges $OT Visit: 1 Visit OT Evaluation $OT Eval Moderate Complexity: 1 Mod OT Treatments $Self Care/Home Management : 8-22 mins  Jasiyah Poland OTR/L 01/05/2022, 4:55 PM

## 2022-01-05 NOTE — Progress Notes (Signed)
  Echocardiogram 2D Echocardiogram has been performed.  Wynelle Link 01/05/2022, 2:51 PM

## 2022-01-05 NOTE — ED Notes (Signed)
Occupational therapy entered room and found patient sitting in the corner with her back against the counter in the floor with the lights off. Patient reports that she sat in the floor and was trying to go home. Patient also reports that her birthday is [facility], 2011. Per report patient is ambulatory and has been walking in the halls. Family is aware.

## 2022-01-05 NOTE — ED Notes (Signed)
Ultrasound at bedside

## 2022-01-06 ENCOUNTER — Observation Stay (HOSPITAL_COMMUNITY): Payer: Medicare HMO

## 2022-01-06 ENCOUNTER — Observation Stay (HOSPITAL_BASED_OUTPATIENT_CLINIC_OR_DEPARTMENT_OTHER)
Admission: RE | Admit: 2022-01-06 | Discharge: 2022-01-06 | Disposition: A | Discharge status: Home / Self Care | Payer: Medicare HMO | Source: Ambulatory Visit | Attending: Internal Medicine | Admitting: Internal Medicine

## 2022-01-06 DIAGNOSIS — G459 Transient cerebral ischemic attack, unspecified: Secondary | ICD-10-CM

## 2022-01-06 LAB — GLUCOSE, CAPILLARY: Glucose-Capillary: 75 mg/dL (ref 70–99)

## 2022-01-06 MED ORDER — CLOPIDOGREL BISULFATE 75 MG PO TABS: 75.0000 mg | ORAL_TABLET | Freq: Every day | ORAL | 2 refills | Status: AC

## 2022-01-06 MED ORDER — INSULIN DETEMIR 100 UNIT/ML ~~LOC~~ SOLN: SUBCUTANEOUS | 0 refills | Status: DC

## 2022-01-06 MED ORDER — ATORVASTATIN CALCIUM 80 MG PO TABS: 80.0000 mg | ORAL_TABLET | Freq: Every evening | ORAL | 2 refills | Status: DC

## 2022-01-06 MED ORDER — ASPIRIN 81 MG PO TBEC: 81.0000 mg | DELAYED_RELEASE_TABLET | Freq: Every day | ORAL | 0 refills | Status: AC

## 2022-01-06 NOTE — Discharge Summary (Addendum)
Physician Discharge Summary  Sandra [name] KVQ:[id number] DOB: [birth year] DOA: 01/04/2022  PCP: Faustino Congress, NP  Admit date: 01/04/2022 Discharge date: 01/06/2022  Admitted From: Home Disposition: Home  Recommendations for Outpatient Follow-up:  Follow up with PCP in 1-2 weeks Follow-up with [facility] 6 weeks Continue aspirin Plavix for 3 weeks followed by Plavix alone Atorvastatin increased to 80 mg p.o. daily Needs further management of her poorly controlled diabetes, insulin increased to 30 units every morning and 15 units subcu p.m. Would benefit from outpatient neuropsych testing for suspicion of cognitive impairment versus early dementia  Home Health: Patient refused home health services on discharge Equipment/Devices: None  Discharge Condition: Stable CODE STATUS: Full code Diet recommendation: Heart healthy/consistent carbohydrate diet  History of present illness:  Sandra [name] is a 88 y.o. female with past medical history significant for depression/anxiety, CKD stage IIIb, type 2 diabetes mellitus, essential hypertension, hyperlipidemia, ILD, hx ICH, scleroderma who presented to [facility] ED via EMS with confusion, facial droop.  Patient was at [employer] celebrating her birthday with a large amount of family in which she stated her upper lip started tingling followed by inability to talk.  EMS was activated and patient started improving in the ambulance prior to arrival to the hospital.   In the ED, temperature 98.2 F, HR 75, RR 16, BP 165/61, SpO2 100% on room air.  WBC 8.1, hemoglobin 11.5, platelets 240.  Sodium 139, potassium 4.7, chloride 111, CO2 21, glucose 146, BUN 44, creatinine 1.97.  INR 1.0.  EtOH level less than 10.  CT head without contrast with no acute intracranial normality or significant interval change, stable remote right occipital lobe infarct, stable moderate diffuse white matter disease likely sequela of chronic  microvascular ischemia.  Neurology was consulted.  TRH consulted for further evaluation and management for concern of acute CVA versus TIA.  Hospital course:  TIA Hx CVA Patient presenting to ED with acute onset facial droop and inability to talk.  On arrival to the ED CT head without contrast with no acute intracranial malady or significant interval change, stable remote right occipital lobe infarct, stable moderate diffuse white matter disease.  MR brain without contrast with no acute intracranial process, no evidence of acute or subacute infarct, redemonstrated remote hemorrhage right temporal and occipital lobe with additional chronic hemorrhagic foci and sequelae of small vessel ischemic disease.  LDL 59.  TTE with LVEF 60-65%, no LV regional wall motion maladies, grade 1 diastolic dysfunction, LA mildly dilated, IVC normal in size.  Seen by speech therapy with no needs identified.  Seen by PT/OT with recommendation of home health.  Neurology recommends aspirin 81 mg p.o. daily and Plavix and 5 mg p.o. daily x 3 weeks followed by Plavix alone.  Atorvastatin increased to 80 mg p.o. daily.  Outpatient follow-up with neurology 6 weeks.  Cognitive impairment versus early dementia Patient with intermittent confusion, per daughter has locked her out of the house prior.  Recommend outpatient neuropsych testing for concern of cognitive impairment versus early dementia.   Type 2 diabetes mellitus, with hyperglycemia Hemoglobin A1c 10.0 03/18/2021, poorly controlled.  Likely secondary to dietary indiscretions.  Levemir increased to 30 units every morning and 15 units every afternoon.  Continue glimepiride.  Outpatient follow-up with PCP for further guidance.   Essential hypertension Home regimen includes amlodipine 5 mg p.o. daily, lisinopril 5 mg p.o. daily, metoprolol succinate 50 mg p.o. daily.   Hyperlipidemia Lipid panel with total cholesterol 142, HDL 34, LDL 59,  triglycerides 244. Atorvastatin 80  mg p.o. daily   CKD stage IIIb Baseline creatinine 1.7-1.8; stable.  Discharge Diagnoses:  Principal Problem:   TIA (transient ischemic attack) Active Problems:   Hyperlipidemia associated with type 2 diabetes mellitus (HCC)   Essential hypertension   Chronic kidney disease (CKD), stage III (moderate) (HCC)   Uncontrolled type 2 diabetes mellitus with hyperglycemia ([place])   Depression   DNR (do not resuscitate)    Discharge Instructions  Discharge Instructions     Ambulatory referral to Neurology   Complete by: As directed    An appointment is requested in approximately: 6 weeks   Call MD for:  difficulty breathing, headache or visual disturbances   Complete by: As directed    Call MD for:  extreme fatigue   Complete by: As directed    Call MD for:  persistant dizziness or light-headedness   Complete by: As directed    Call MD for:  persistant nausea and vomiting   Complete by: As directed    Call MD for:  severe uncontrolled pain   Complete by: As directed    Call MD for:  temperature >100.4   Complete by: As directed    Diet - low sodium heart healthy   Complete by: As directed    Increase activity slowly   Complete by: As directed       Allergies as of 01/06/2022       Reactions   Cephalexin Nausea And Vomiting   Pt stated made severely sick, will never take again   Nintedanib Diarrhea   Pioglitazone Other (See Comments)   REACTION: EDEMA   Sertraline Nausea And Vomiting        Medication List     TAKE these medications    acetaminophen 500 MG tablet Commonly known as: TYLENOL Take 500 mg by mouth every 6 (six) hours as needed for mild pain or headache.   amLODipine 5 MG tablet Commonly known as: NORVASC Take 5 mg by mouth daily.   aspirin EC 81 MG tablet Take 1 tablet (81 mg total) by mouth daily for 21 days. Swallow whole. Start taking on: January 07, 2022   atorvastatin 80 MG tablet Commonly known as: LIPITOR Take 1 tablet (80 mg  total) by mouth every evening. What changed:  medication strength how much to take when to take this   clonazePAM 0.5 MG tablet Commonly known as: KLONOPIN Take 0.5 mg by mouth 2 (two) times daily as needed for anxiety.   clopidogrel 75 MG tablet Commonly known as: PLAVIX Take 1 tablet (75 mg total) by mouth daily. Start taking on: January 07, 2022   famotidine 40 MG tablet Commonly known as: PEPCID Take 0.5 tablets (20 mg total) by mouth daily. What changed:  how much to take when to take this   glimepiride 2 MG tablet Commonly known as: AMARYL Take 2 mg by mouth in the morning and at bedtime.   insulin detemir 100 UNIT/ML injection Commonly known as: LEVEMIR 30 units qAm and 15 units qPM What changed:  how much to take how to take this when to take this additional instructions   lisinopril 5 MG tablet Commonly known as: ZESTRIL Take 5 mg by mouth daily.   metoprolol succinate 50 MG 24 hr tablet Commonly known as: TOPROL-XL Take 1 tablet (50 mg total) by mouth daily. Take with or immediately following a meal.   NovoLOG FlexPen 100 UNIT/ML FlexPen Generic drug: insulin aspart Inject 10 Units into  the skin 3 (three) times daily before meals.        Follow-up Information     Faustino Congress, NP. Schedule an appointment as soon as possible for a visit in 1 week(s).   Specialty: Family Medicine Contact information: [place] [address] [phone]         [facility]. Schedule an appointment as soon as possible for a visit in 6 week(s).   Specialty: Neurology Contact information: [place] [address] [phone]               Allergies  Allergen Reactions   Cephalexin Nausea And Vomiting    Pt stated made severely sick, will never take again   Nintedanib Diarrhea   Pioglitazone Other (See Comments)    REACTION: EDEMA   Sertraline Nausea And Vomiting     Consultations: Neurology   Procedures/Studies: ECHOCARDIOGRAM COMPLETE  Result Date: 01/05/2022    ECHOCARDIOGRAM REPORT   Patient Name:   LUCILA KLECKA Date of Exam: 01/05/2022 Medical Rec #:  [id number]     Height:       60.0 in Accession #:    [id number]    Weight:       135.1 lb Date of Birth:  1933/07/14    BSA:          1.580 m Patient Age:    61 years      BP:           131/52 mmHg Patient Gender: F             HR:           69 bpm. Exam Location:  Inpatient Procedure: 2D Echo, Color Doppler and Cardiac Doppler Indications:    TIA G45.9  History:        Patient has prior history of Echocardiogram examinations, most                 recent 03/18/2021. Cardiomyopathy, TIA; Risk                 Factors:Dyslipidemia, Hypertension, Diabetes and Non-Smoker.  Sonographer:    Greer Pickerel Referring Phys: [id number] JENNIFER YATES  Sonographer Comments: Suboptimal subcostal window. Image acquisition challenging due to patient body habitus and Image acquisition challenging due to respiratory motion. IMPRESSIONS  1. Left ventricular ejection fraction, by estimation, is 60 to 65%. The left ventricle has normal function. The left ventricle has no regional wall motion abnormalities. Left ventricular diastolic parameters are consistent with Grade I diastolic dysfunction (impaired relaxation).  2. Right ventricular systolic function is normal. The right ventricular size is normal. There is normal pulmonary artery systolic pressure. The estimated right ventricular systolic pressure is 01.7 mmHg.  3. Left atrial size was mildly dilated.  4. The mitral valve is grossly normal. Trivial mitral valve regurgitation. No evidence of mitral stenosis.  5. The aortic valve is tricuspid. There is mild calcification of the aortic valve. There is mild thickening of the aortic valve. Aortic valve regurgitation is not visualized. Aortic valve sclerosis is present, with no evidence of aortic valve stenosis.  6. The inferior vena cava is  normal in size with greater than 50% respiratory variability, suggesting right atrial pressure of 3 mmHg. Comparison(s): No significant change from prior study. FINDINGS  Left Ventricle: Left ventricular ejection fraction, by estimation, is 60 to 65%. The left ventricle has normal function. The left ventricle has no regional wall motion abnormalities. The left  ventricular internal cavity size was normal in size. There is  no left ventricular hypertrophy. Left ventricular diastolic parameters are consistent with Grade I diastolic dysfunction (impaired relaxation). Right Ventricle: The right ventricular size is normal. No increase in right ventricular wall thickness. Right ventricular systolic function is normal. There is normal pulmonary artery systolic pressure. The tricuspid regurgitant velocity is 1.71 m/s, and  with an assumed right atrial pressure of 3 mmHg, the estimated right ventricular systolic pressure is 30.0 mmHg. Left Atrium: Left atrial size was mildly dilated. Right Atrium: Right atrial size was normal in size. Pericardium: Trivial pericardial effusion is present. Presence of epicardial fat layer. Mitral Valve: The mitral valve is grossly normal. Trivial mitral valve regurgitation. No evidence of mitral valve stenosis. Tricuspid Valve: The tricuspid valve is grossly normal. Tricuspid valve regurgitation is trivial. No evidence of tricuspid stenosis. Aortic Valve: The aortic valve is tricuspid. There is mild calcification of the aortic valve. There is mild thickening of the aortic valve. Aortic valve regurgitation is not visualized. Aortic valve sclerosis is present, with no evidence of aortic valve stenosis. Pulmonic Valve: The pulmonic valve was grossly normal. Pulmonic valve regurgitation is trivial. No evidence of pulmonic stenosis. Aorta: The aortic root and ascending aorta are structurally normal, with no evidence of dilitation. Venous: The inferior vena cava is normal in size with greater than  50% respiratory variability, suggesting right atrial pressure of 3 mmHg. IAS/Shunts: The atrial septum is grossly normal.  LEFT VENTRICLE PLAX 2D LVIDd:         3.60 cm   Diastology LVIDs:         2.40 cm   LV e' medial:    4.68 cm/s LV PW:         0.80 cm   LV E/e' medial:  21.2 LV IVS:        0.70 cm   LV e' lateral:   6.20 cm/s LVOT diam:     1.60 cm   LV E/e' lateral: 16.0 LV SV:         41 LV SV Index:   26 LVOT Area:     2.01 cm  RIGHT VENTRICLE RV S prime:     14.90 cm/s TAPSE (M-mode): 2.0 cm LEFT ATRIUM           Index        RIGHT ATRIUM           Index LA diam:      2.50 cm 1.58 cm/m   RA Area:     11.50 cm LA Vol (A4C): 67.1 ml 42.46 ml/m  RA Volume:   26.40 ml  16.71 ml/m  AORTIC VALVE LVOT Vmax:   75.10 cm/s LVOT Vmean:  47.600 cm/s LVOT VTI:    0.204 m  AORTA Ao Root diam: 3.00 cm Ao Asc diam:  3.20 cm MITRAL VALVE                TRICUSPID VALVE MV Area (PHT): 3.10 cm     TR Peak grad:   11.7 mmHg MV Decel Time: 245 msec     TR Vmax:        171.00 cm/s MV E velocity: 99.40 cm/s MV A velocity: 118.00 cm/s  SHUNTS MV E/A ratio:  0.84         Systemic VTI:  0.20 m  Systemic Diam: 1.60 cm Eleonore Chiquito MD Electronically signed by Eleonore Chiquito MD Signature Date/Time: 01/05/2022/4:51:39 PM    Final    MR BRAIN WO CONTRAST  Result Date: 01/04/2022 CLINICAL DATA:  Stroke, follow-up EXAM: MRI HEAD WITHOUT CONTRAST TECHNIQUE: Multiplanar, multiecho pulse sequences of the brain and surrounding structures were obtained without intravenous contrast. COMPARISON:  03/17/2021 MRI head, correlation is also made with 01/04/2022 CT head FINDINGS: Brain: No restricted diffusion to suggest acute or subacute infarct.No acute hemorrhage, mass, mass effect, or midline shift. No hydrocephalus or extra-axial collection. Redemonstrated chronic hemorrhage in the posterior right temporal and lateral right occipital lobe, with additional chronic hemorrhagic foci in the left posterior temporal  and occipital lobe, bilateral frontal lobes, and right parietal lobe; these appear unchanged compared to the susceptibility weighted sequence from 08/29/2019, with the exception of some likely remote subarachnoid hemorrhage in the occipital lobes (series 7, image 40). Vascular: Patent arterial flow voids. Skull and upper cervical spine: Normal marrow signal. Sinuses/Orbits: Mucosal thickening in the left greater than right maxillary sinus and bilateral ethmoid air cells.Status post bilateral lens replacements. Other: Trace fluid in the mastoid air cells. IMPRESSION: 1. No acute intracranial process. No evidence of acute or subacute infarct. 2. Redemonstrated remote hemorrhage in the right temporal and occipital lobe, with additional chronic hemorrhagic foci and sequela of small vessel ischemic disease. Electronically Signed   By: Merilyn Baba M.D.   On: 01/04/2022 23:51   CT HEAD CODE STROKE WO CONTRAST  Result Date: 01/04/2022 CLINICAL DATA:  Code stroke. Neuro deficit, acute, stroke suspected. EXAM: CT HEAD WITHOUT CONTRAST TECHNIQUE: Contiguous axial images were obtained from the base of the skull through the vertex without intravenous contrast. RADIATION DOSE REDUCTION: This exam was performed according to the departmental dose-optimization program which includes automated exposure control, adjustment of the mA and/or kV according to patient size and/or use of iterative reconstruction technique. COMPARISON:  CT head without contrast 03/17/2021. MR head without contrast 03/17/2021. FINDINGS: Brain: Remote right occipital lobe infarct is stable. Moderate diffuse white matter disease is similar the prior studies. No acute infarct, hemorrhage, or mass lesion is present. Basal ganglia are intact. Insular ribbon is normal. No acute cortical abnormality is present. The ventricles are proportionate to the degree of atrophy. No significant extraaxial fluid collection is present. The brainstem and cerebellum are  within normal limits. Vascular: Atherosclerotic calcifications are present within the cavernous internal carotid arteries bilaterally. No hyperdense vessel is present. Skull: Calvarium is intact. No focal lytic or blastic lesions are present. No significant extracranial soft tissue lesion is present. Sinuses/Orbits: The paranasal sinuses and mastoid air cells are clear. The globes and orbits are within normal limits. ASPECTS [facility] Stroke Program Early CT Score) - Ganglionic level infarction (caudate, lentiform nuclei, internal capsule, insula, M1-M3 cortex): 7/7 - Supraganglionic infarction (M4-M6 cortex): 3/3 Total score (0-10 with 10 being normal): 10/10 IMPRESSION: 1. No acute intracranial abnormality or significant interval change. 2. Stable remote right occipital lobe infarct. 3. Stable moderate diffuse white matter disease. This likely reflects the sequela of chronic microvascular ischemia. 4. Aspects is 10/10. The above was relayed via text pager to DR. Terrian Sentell LINDZEN on 01/04/2022 at 13:33 . Electronically Signed   By: San Morelle M.D.   On: 01/04/2022 13:34     Subjective: Patient seen examined bedside, resting comfortably.  No family present.  Patient with no complaints this morning and ready for discharge home.  Discussed with patient's daughter via telephone regarding plan per neurology  with DAPT x 3 weeks followed by Plavix alone.  Discussed patient needs further monitoring of her diet as her indiscretions likely leading risk factor to her symptoms, TIA and eventually could lead to a stroke.  Patient with no other complaints or concerns at this time.  Denies headache, no dizziness, no chest pain, no palpitations, no shortness of breath, no abdominal pain, no fever/chills/night sweats, no nausea/vomiting/diarrhea, no focal weakness, no fatigue, no paresthesias.  No acute events overnight per nursing staff.  Discharge Exam: Vitals:   01/06/22 0336 01/06/22 0821  BP: (!) 150/52 (!)  154/59  Pulse: 78 79  Resp: 18 16  Temp: 97.6 F (36.4 C) 98.3 F (36.8 C)  SpO2: 97% 97%   Vitals:   01/05/22 2027 01/05/22 2346 01/06/22 0336 01/06/22 0821  BP: (!) 148/69 (!) 144/44 (!) 150/52 (!) 154/59  Pulse: 85 86 78 79  Resp: '18 18 18 16  '$ Temp: 97.8 F (36.6 C) 98 F (36.7 C) 97.6 F (36.4 C) 98.3 F (36.8 C)  TempSrc: Oral Oral Oral Oral  SpO2: 98% 94% 97% 97%  Weight:      Height:        Physical Exam: GEN: NAD, alert, elderly in appearance HEENT: NCAT, PERRL, EOMI, sclera clear, MMM PULM: CTAB w/o wheezes/crackles, normal respiratory effort, on room air CV: RRR w/o M/G/R GI: abd soft, NTND, NABS, no R/G/M MSK: no peripheral edema, muscle strength globally intact 5/5 bilateral upper/lower extremities NEURO: CN II-XII intact, no focal deficits, sensation to light touch intact PSYCH: normal mood/affect Integumentary: dry/intact, no rashes or wounds    The results of significant diagnostics from this hospitalization (including imaging, microbiology, ancillary and laboratory) are listed below for reference.     Microbiology: No results found for this or any previous visit (from the past 240 hour(s)).   Labs: BNP (last 3 results) No results for input(s): "BNP" in the last 8760 hours. Basic Metabolic Panel: Recent Labs  Lab 01/04/22 1313 01/04/22 1317 01/05/22 0735  NA 142 139 141  K 4.8 4.7 4.3  CL 110 111 113*  CO2  --  21* 22  GLUCOSE 145* 146* 140*  BUN 45* 44* 37*  CREATININE 2.10* 1.97* 1.83*  CALCIUM  --  9.5 9.4   Liver Function Tests: Recent Labs  Lab 01/04/22 1317  AST 20  ALT 15  ALKPHOS 75  BILITOT 0.6  PROT 7.0  ALBUMIN 3.8   No results for input(s): "LIPASE", "AMYLASE" in the last 168 hours. No results for input(s): "AMMONIA" in the last 168 hours. CBC: Recent Labs  Lab 01/04/22 1313 01/04/22 1317  WBC  --  8.1  NEUTROABS  --  4.9  HGB 11.2* 11.5*  HCT 33.0* 34.9*  MCV  --  97.8  PLT  --  240   Cardiac  Enzymes: No results for input(s): "CKTOTAL", "CKMB", "CKMBINDEX", "TROPONINI" in the last 168 hours. BNP: Invalid input(s): "POCBNP" CBG: Recent Labs  Lab 01/04/22 2206 01/05/22 1119 01/05/22 1649 01/05/22 2025 01/06/22 0823  GLUCAP 180* 190* 121* 97 75   D-Dimer No results for input(s): "DDIMER" in the last 72 hours. Hgb A1c No results for input(s): "HGBA1C" in the last 72 hours. Lipid Profile Recent Labs    01/05/22 0826  CHOL 142  HDL 34*  LDLCALC 59  TRIG 244*  CHOLHDL 4.2   Thyroid function studies No results for input(s): "TSH", "T4TOTAL", "T3FREE", "THYROIDAB" in the last 72 hours.  Invalid input(s): "FREET3" Anemia work up No results  for input(s): "VITAMINB12", "FOLATE", "FERRITIN", "TIBC", "IRON", "RETICCTPCT" in the last 72 hours. Urinalysis    Component Value Date/Time   COLORURINE STRAW (A) 03/20/2021 1128   APPEARANCEUR CLEAR 03/20/2021 1128   LABSPEC 1.010 03/20/2021 1128   PHURINE 5.0 03/20/2021 1128   GLUCOSEU 50 (A) 03/20/2021 1128   GLUCOSEU >=1000 (A) 12/14/2016 1058   HGBUR SMALL (A) 03/20/2021 1128   HGBUR moderate 10/31/2009 0958   BILIRUBINUR NEGATIVE 03/20/2021 1128   BILIRUBINUR negative 10/13/2016 1059   KETONESUR NEGATIVE 03/20/2021 1128   PROTEINUR 30 (A) 03/20/2021 1128   UROBILINOGEN 0.2 12/14/2016 1058   NITRITE NEGATIVE 03/20/2021 1128   LEUKOCYTESUR NEGATIVE 03/20/2021 1128   Sepsis Labs Recent Labs  Lab 01/04/22 1317  WBC 8.1   Microbiology No results found for this or any previous visit (from the past 240 hour(s)).   Time coordinating discharge: Over 30 minutes  SIGNED:   Tymesha Ditmore J [place] DO  [facility] 01/06/2022, 10:37 AM

## 2022-01-06 NOTE — Progress Notes (Signed)
Carotid duplex has been completed.   Preliminary results in CV Proc.   Sandra [name] 01/06/2022 2:17 PM

## 2022-01-06 NOTE — Discharge Instructions (Signed)
Cotninue aspirin 81 mg p.o. daily and Plavix 75 mg p.o. daily for 3 weeks followed by aspirin alone.  Outpatient follow-up with PCP 1 week and neurology in 6 weeks.  Recommend monitoring patient's carbohydrate intake closely as her diabetes likely poorly controlled from dietary indiscretions.  Also recommend outpatient neuropsych testing for likely cognitive delay versus early dementia.

## 2022-01-06 NOTE — Progress Notes (Signed)
STROKE TEAM PROGRESS NOTE   INTERVAL HISTORY Patient is sitting up in bed all dressed up to go home.  She has no complaints.  Carotid ultrasound shows no significant bilateral extracranial stenosis. Vitals:   01/05/22 2027 01/05/22 2346 01/06/22 0336 01/06/22 0821  BP: (!) 148/69 (!) 144/44 (!) 150/52 (!) 154/59  Pulse: 85 86 78 79  Resp: '18 18 18 16  '$ Temp: 97.8 F (36.6 C) 98 F (36.7 C) 97.6 F (36.4 C) 98.3 F (36.8 C)  TempSrc: Oral Oral Oral Oral  SpO2: 98% 94% 97% 97%  Weight:      Height:       CBC:  Recent Labs  Lab 01/04/22 1313 01/04/22 1317  WBC  --  8.1  NEUTROABS  --  4.9  HGB 11.2* 11.5*  HCT 33.0* 34.9*  MCV  --  97.8  PLT  --  016   Basic Metabolic Panel:  Recent Labs  Lab 01/04/22 1317 01/05/22 0735  NA 139 141  K 4.7 4.3  CL 111 113*  CO2 21* 22  GLUCOSE 146* 140*  BUN 44* 37*  CREATININE 1.97* 1.83*  CALCIUM 9.5 9.4   Lipid Panel:  Recent Labs  Lab 01/05/22 0826  CHOL 142  TRIG 244*  HDL 34*  CHOLHDL 4.2  VLDL 49*  LDLCALC 59   HgbA1c: No results for input(s): "HGBA1C" in the last 168 hours. Urine Drug Screen: No results for input(s): "LABOPIA", "COCAINSCRNUR", "LABBENZ", "AMPHETMU", "THCU", "LABBARB" in the last 168 hours.  Alcohol Level  Recent Labs  Lab 01/04/22 1317  ETH <10    IMAGING past 24 hours VAS US CAROTID  Result Date: 01/06/2022 Carotid Arterial Duplex Study Patient Name:  JALEYAH LONGHI  Date of Exam:   01/06/2022 Medical Rec #: [id number]      Accession #:    [id number] Date of Birth: 11/21/1933     Patient Gender: F Patient Age:   88 years Exam Location:  [facility] Procedure:      VAS US CAROTID Referring Phys: ERIC [place] --------------------------------------------------------------------------------  Indications:       TIA. Risk Factors:      Hypertension, hyperlipidemia, Diabetes. Comparison Study:  prior 03/18/21 Performing Technologist: Archie Patten RVS  Examination Guidelines: A complete  evaluation includes B-mode imaging, spectral Doppler, color Doppler, and power Doppler as needed of all accessible portions of each vessel. Bilateral testing is considered an integral part of a complete examination. Limited examinations for reoccurring indications may be performed as noted.  Right Carotid Findings: +----------+--------+--------+--------+------------------+--------+           PSV cm/sEDV cm/sStenosisPlaque DescriptionComments +----------+--------+--------+--------+------------------+--------+ CCA Prox  56      8               heterogenous               +----------+--------+--------+--------+------------------+--------+ CCA Distal57      11              heterogenous               +----------+--------+--------+--------+------------------+--------+ ICA Prox  78      16      1-39%   heterogenous               +----------+--------+--------+--------+------------------+--------+ ICA Distal74      16  tortuous +----------+--------+--------+--------+------------------+--------+ ECA       98                                                 +----------+--------+--------+--------+------------------+--------+ +----------+--------+-------+--------+-------------------+           PSV cm/sEDV cmsDescribeArm Pressure (mmHG) +----------+--------+-------+--------+-------------------+ [id number]                                         +----------+--------+-------+--------+-------------------+ +---------+--------+--+--------+--+---------+ VertebralPSV cm/s55EDV cm/s12Antegrade +---------+--------+--+--------+--+---------+  Left Carotid Findings: +----------+--------+--------+--------+------------------+--------+           PSV cm/sEDV cm/sStenosisPlaque DescriptionComments +----------+--------+--------+--------+------------------+--------+ CCA Prox  114     15              heterogenous                +----------+--------+--------+--------+------------------+--------+ CCA Distal75      12              heterogenous               +----------+--------+--------+--------+------------------+--------+ ICA Prox  67      16      1-39%   heterogenous      tortuous +----------+--------+--------+--------+------------------+--------+ ICA Distal109     23                                tortuous +----------+--------+--------+--------+------------------+--------+ ECA       57                                                 +----------+--------+--------+--------+------------------+--------+ +----------+--------+--------+--------+-------------------+           PSV cm/sEDV cm/sDescribeArm Pressure (mmHG) +----------+--------+--------+--------+-------------------+ [id number]                                          +----------+--------+--------+--------+-------------------+ +---------+--------+--+--------+-+---------+ VertebralPSV cm/s32EDV cm/s7Antegrade +---------+--------+--+--------+-+---------+   Summary: Right Carotid: Velocities in the right ICA are consistent with a 1-39% stenosis. Left Carotid: Velocities in the left ICA are consistent with a 1-39% stenosis. Vertebrals: Bilateral vertebral arteries demonstrate antegrade flow. *See table(s) above for measurements and observations.     Preliminary     PHYSICAL EXAM General: well-appearing and in no acute distress HEENT: normocephalic and atraumatic Cardiovascular: regular rate Respiratory: normal respiratory effort and on RA Gastrointestinal: non-tender and non-distended Extremities: moving all extremities spontaneously  Mental Status: Sandra [name] is alert; she is oriented to person, oriented to place, and oriented to situation. Speech was clear and fluent without evidence of aphasia. She was able to follow 3 step commands but with difficulty.  Cranial Nerves: II:  Visual fields  difficult to assess, patient has  vision problems and does not follow instructions properly III,IV, VI: no ptosis, extra-ocular motions intact bilaterally V,VII: smile symmetric, facial light touch sensation intact bilaterally XII: midline tongue extension without atrophy and without fasciculations  Motor: Right : Upper extremity   5/5 full power  Lower extremity   5/5 full power  Left: Upper  extremity   5/5 full power Lower extremity   5/5 full power  Tone and bulk: normal tone throughout; no atrophy noted  Sensory: sensation to light touch intact throughout bilaterally  Cerebellar: Finger-to-nose test normal  Gait: normal  ASSESSMENT/PLAN Demetric Parslow is a 88 y.o. female with past medical history of MI, CKD, DM, HTN, HA, HLD, SAH, TIA, ICH w/SDH 2021, and cognitive impairment.  she was at the [employer] attempting to order a drink when she suddenly developed inability to speak, with left facial droop. Code stroke was activated by [facility]. CT head negative.  # Right brain subcortical TIA likely from small vessel disease #Remote R occipital lobe infarct Code Stroke CT head no acute intracranial abnormality or significant interval change. Stable remote right occipital lobe infarct. Stable moderate diffuse white matter disease. This likely reflects the sequela of chronic microvascular ischemia. ASPECTS 10.   MRI  shows no acute intracranial process. No evidence of acute or subacute infarct. Redemonstrated remote hemorrhage in the right temporal and occipital lobe, with additional chronic hemorrhagic foci and sequela of small vessel ischemic disease. Carotid Doppler bilateral 1-39% stenosis 2D Echo 60 to 65% EF. LDL 102 HgbA1c 10.0 VTE prophylaxis - enoxaparin    Diet   Diet Carb Modified Fluid consistency: Thin; Room service appropriate? Yes with Assist   aspirin 81 mg daily prior to admission, now on aspirin 81 mg daily and clopidogrel 75 mg daily. Continue aspirin 81 mg and clopidogrel 75 mg daily for 21 days,  then clopidogrel 75 mg alone daily indefinitely Therapy recommendations: pending evaluation Disposition:  pending medical clearance, stroke workup  Hypertension, chronic Home meds:  amlodipine, lisinopril, held on admission Stable Permissive hypertension (OK if < 220/120) but gradually normalize in 5-7 days Long-term BP goal normotensive  Hyperlipidemia, chronic Home meds:  atorvastatin 40 mg, increased to 80 mg given LDL not at goal LDL 102, goal < 70 High intensity statin indicated. Continue statin at discharge  Diabetes type II Uncontrolled Home meds:  insulin HgbA1c 10.0 last checked 03/18/2021, recheck pending. Goal < 7.0 CBGs Recent Labs    01/05/22 1649 01/05/22 2025 01/06/22 0823  GLUCAP 121* 97 75    SSI  Other Stroke Risk Factors Advanced Age >/= 57  Hx stroke/TIA  Other Active Problems Stage 3b CKD Depression/anxiety   Hospital day # 0   She presented with transient left facial droop and speech difficulties which appear to have resolved and MRI shows no acute stroke.  Likely TIA due to small vessel disease.  Recommend aspirin and Plavix for 3 weeks followed by Plavix alone and aggressive risk factor modification.   Follow-up as an outpatient with stroke nurse practitioner in 2 months or call earlier if necessary.  Antony Contras, MD Medical Director [facility] Pager: [phone] 01/06/2022 3:21 PM  To contact Stroke Continuity provider, please refer to [email or web address] After hours, contact General Neurology

## 2022-01-06 NOTE — Care Management Obs Status (Signed)
[facility] NOTIFICATION   Patient Details  Name: Sandra [name] MRN: [id number] Date of Birth: 1933-10-16   Medicare Observation Status Notification Given:  Yes    Pollie Friar, RN 01/06/2022, 11:16 AM

## 2022-01-06 NOTE — TOC Transition Note (Signed)
Transition of Care [facility]) - CM/SW Discharge Note   Patient Details  Name: Sandra [name] MRN: [id number] Date of Birth: 03-13-1933  [facility] CM/SW Contact:  Pollie Friar, RN Phone Number: 01/06/2022, 11:43 AM   Clinical Narrative:    Pt is discharging home with self care. Pt with recommendations and orders for [facility] services but pt is refusing. She states she has had HH in the past and doesn't want their services again.  DME at home: walker and shower seat. Pt's daughter is home in the daytime and works at night. Her granddaughter is home at night.  Her grandson oversees her medications and she denies any issues.  Pt drives self as needed.  Daughter providing transportation home today.   Final next level of care: Home/Self Care Barriers to Discharge: No Barriers Identified   Patient Goals and CMS Choice        Discharge Placement                       Discharge Plan and Services                                     Social Determinants of Health (SDOH) Interventions     Readmission Risk Interventions     No data to display

## 2022-01-06 NOTE — Plan of Care (Signed)
  Problem: Health Behavior/Discharge Planning: Goal: Ability to manage health-related needs will improve Outcome: Not Progressing Goal: Goals will be collaboratively established with patient/family Outcome: Not Progressing   Problem: Metabolic: Goal: Ability to maintain appropriate glucose levels will improve Outcome: Not Progressing   Problem: Tissue Perfusion: Goal: Adequacy of tissue perfusion will improve Outcome: Not Progressing   Problem: Safety: Goal: Ability to remain free from injury will improve Outcome: Not Progressing

## 2022-01-08 DIAGNOSIS — G459 Transient cerebral ischemic attack, unspecified: Secondary | ICD-10-CM | POA: Diagnosis not present

## 2022-01-08 DIAGNOSIS — E7800 Pure hypercholesterolemia, unspecified: Secondary | ICD-10-CM | POA: Diagnosis not present

## 2022-01-08 DIAGNOSIS — F418 Other specified anxiety disorders: Secondary | ICD-10-CM | POA: Diagnosis not present

## 2022-01-08 DIAGNOSIS — K219 Gastro-esophageal reflux disease without esophagitis: Secondary | ICD-10-CM | POA: Diagnosis not present

## 2022-01-08 DIAGNOSIS — E1165 Type 2 diabetes mellitus with hyperglycemia: Secondary | ICD-10-CM | POA: Diagnosis not present

## 2022-01-08 DIAGNOSIS — N189 Chronic kidney disease, unspecified: Secondary | ICD-10-CM | POA: Diagnosis not present

## 2022-01-08 DIAGNOSIS — I10 Essential (primary) hypertension: Secondary | ICD-10-CM | POA: Diagnosis not present

## 2022-01-11 ENCOUNTER — Emergency Department (HOSPITAL_COMMUNITY): Payer: Medicare HMO

## 2022-01-11 ENCOUNTER — Other Ambulatory Visit: Payer: Self-pay

## 2022-01-11 ENCOUNTER — Emergency Department (HOSPITAL_COMMUNITY)
Admission: EM | Admit: 2022-01-11 | Discharge: 2022-01-12 | Disposition: A | Discharge status: Home / Self Care | Payer: Medicare HMO | Attending: Emergency Medicine | Admitting: Emergency Medicine

## 2022-01-11 ENCOUNTER — Encounter (HOSPITAL_COMMUNITY): Payer: Self-pay

## 2022-01-11 DIAGNOSIS — R4781 Slurred speech: Secondary | ICD-10-CM | POA: Insufficient documentation

## 2022-01-11 DIAGNOSIS — G459 Transient cerebral ischemic attack, unspecified: Secondary | ICD-10-CM | POA: Diagnosis not present

## 2022-01-11 DIAGNOSIS — I6782 Cerebral ischemia: Secondary | ICD-10-CM | POA: Diagnosis not present

## 2022-01-11 DIAGNOSIS — R531 Weakness: Secondary | ICD-10-CM | POA: Insufficient documentation

## 2022-01-11 DIAGNOSIS — G4489 Other headache syndrome: Secondary | ICD-10-CM | POA: Diagnosis not present

## 2022-01-11 DIAGNOSIS — N183 Chronic kidney disease, stage 3 unspecified: Secondary | ICD-10-CM | POA: Diagnosis not present

## 2022-01-11 DIAGNOSIS — R918 Other nonspecific abnormal finding of lung field: Secondary | ICD-10-CM | POA: Diagnosis not present

## 2022-01-11 DIAGNOSIS — Z79899 Other long term (current) drug therapy: Secondary | ICD-10-CM | POA: Diagnosis not present

## 2022-01-11 DIAGNOSIS — Z7902 Long term (current) use of antithrombotics/antiplatelets: Secondary | ICD-10-CM | POA: Diagnosis not present

## 2022-01-11 DIAGNOSIS — G9389 Other specified disorders of brain: Secondary | ICD-10-CM | POA: Diagnosis not present

## 2022-01-11 DIAGNOSIS — R29818 Other symptoms and signs involving the nervous system: Secondary | ICD-10-CM | POA: Diagnosis not present

## 2022-01-11 DIAGNOSIS — I129 Hypertensive chronic kidney disease with stage 1 through stage 4 chronic kidney disease, or unspecified chronic kidney disease: Secondary | ICD-10-CM | POA: Diagnosis not present

## 2022-01-11 DIAGNOSIS — Z7982 Long term (current) use of aspirin: Secondary | ICD-10-CM | POA: Diagnosis not present

## 2022-01-11 DIAGNOSIS — I619 Nontraumatic intracerebral hemorrhage, unspecified: Secondary | ICD-10-CM | POA: Diagnosis not present

## 2022-01-11 DIAGNOSIS — R519 Headache, unspecified: Secondary | ICD-10-CM | POA: Diagnosis not present

## 2022-01-11 DIAGNOSIS — I6529 Occlusion and stenosis of unspecified carotid artery: Secondary | ICD-10-CM | POA: Diagnosis not present

## 2022-01-11 DIAGNOSIS — R42 Dizziness and giddiness: Secondary | ICD-10-CM | POA: Insufficient documentation

## 2022-01-11 DIAGNOSIS — R110 Nausea: Secondary | ICD-10-CM | POA: Insufficient documentation

## 2022-01-11 DIAGNOSIS — E1122 Type 2 diabetes mellitus with diabetic chronic kidney disease: Secondary | ICD-10-CM | POA: Insufficient documentation

## 2022-01-11 DIAGNOSIS — R29810 Facial weakness: Secondary | ICD-10-CM | POA: Diagnosis not present

## 2022-01-11 DIAGNOSIS — R2990 Unspecified symptoms and signs involving the nervous system: Secondary | ICD-10-CM

## 2022-01-11 LAB — COMPREHENSIVE METABOLIC PANEL
ALT: 23 U/L (ref 0–44)
AST: 30 U/L (ref 15–41)
Albumin: 3.8 g/dL (ref 3.5–5.0)
Alkaline Phosphatase: 79 U/L (ref 38–126)
Anion gap: 8 (ref 5–15)
BUN: 61 mg/dL — ABNORMAL HIGH (ref 8–23)
CO2: 22 mmol/L (ref 22–32)
Calcium: 9 mg/dL (ref 8.9–10.3)
Chloride: 111 mmol/L (ref 98–111)
Creatinine, Ser: 2.38 mg/dL — ABNORMAL HIGH (ref 0.44–1.00)
GFR, Estimated: 19 mL/min — ABNORMAL LOW (ref >60)
Glucose, Bld: 183 mg/dL — ABNORMAL HIGH (ref 70–99)
Potassium: 5 mmol/L (ref 3.5–5.1)
Sodium: 141 mmol/L (ref 135–145)
Total Bilirubin: 0.7 mg/dL (ref 0.3–1.2)
Total Protein: 7 g/dL (ref 6.5–8.1)

## 2022-01-11 LAB — DIFFERENTIAL
Abs Immature Granulocytes: 0.01 10*3/uL (ref 0.00–0.07)
Basophils Absolute: 0.1 10*3/uL (ref 0.0–0.1)
Basophils Relative: 1 %
Eosinophils Absolute: 0.3 10*3/uL (ref 0.0–0.5)
Eosinophils Relative: 4 %
Immature Granulocytes: 0 %
Lymphocytes Relative: 30 %
Lymphs Abs: 2.4 10*3/uL (ref 0.7–4.0)
Monocytes Absolute: 0.8 10*3/uL (ref 0.1–1.0)
Monocytes Relative: 10 %
Neutro Abs: 4.4 10*3/uL (ref 1.7–7.7)
Neutrophils Relative %: 55 %

## 2022-01-11 LAB — I-STAT CHEM 8, ED
BUN: 58 mg/dL — ABNORMAL HIGH (ref 8–23)
Calcium, Ion: 1.08 mmol/L — ABNORMAL LOW (ref 1.15–1.40)
Chloride: 111 mmol/L (ref 98–111)
Creatinine, Ser: 2.5 mg/dL — ABNORMAL HIGH (ref 0.44–1.00)
Glucose, Bld: 183 mg/dL — ABNORMAL HIGH (ref 70–99)
HCT: 32 % — ABNORMAL LOW (ref 36.0–46.0)
Hemoglobin: 10.9 g/dL — ABNORMAL LOW (ref 12.0–15.0)
Potassium: 5 mmol/L (ref 3.5–5.1)
Sodium: 141 mmol/L (ref 135–145)
TCO2: 21 mmol/L — ABNORMAL LOW (ref 22–32)

## 2022-01-11 LAB — CBC
HCT: 33.8 % — ABNORMAL LOW (ref 36.0–46.0)
Hemoglobin: 10.9 g/dL — ABNORMAL LOW (ref 12.0–15.0)
MCH: 32.3 pg (ref 26.0–34.0)
MCHC: 32.2 g/dL (ref 30.0–36.0)
MCV: 100.3 fL — ABNORMAL HIGH (ref 80.0–100.0)
Platelets: 252 10*3/uL (ref 150–400)
RBC: 3.37 MIL/uL — ABNORMAL LOW (ref 3.87–5.11)
RDW: 11.8 % (ref 11.5–15.5)
WBC: 8 10*3/uL (ref 4.0–10.5)
nRBC: 0 % (ref 0.0–0.2)

## 2022-01-11 LAB — APTT: aPTT: 23 seconds — ABNORMAL LOW (ref 24–36)

## 2022-01-11 LAB — ETHANOL: Alcohol, Ethyl (B): <10 mg/dL (ref <10)

## 2022-01-11 LAB — TROPONIN I (HIGH SENSITIVITY): Troponin I (High Sensitivity): 9 ng/L (ref <18)

## 2022-01-11 LAB — PROTIME-INR
INR: 1 (ref 0.8–1.2)
Prothrombin Time: 12.6 seconds (ref 11.4–15.2)

## 2022-01-11 LAB — CBG MONITORING, ED: Glucose-Capillary: 185 mg/dL — ABNORMAL HIGH (ref 70–99)

## 2022-01-11 MED ORDER — SODIUM CHLORIDE 0.9% FLUSH
3.0000 mL | Freq: Once | INTRAVENOUS | Status: AC
  Administered 2022-01-11: 3 mL via INTRAVENOUS

## 2022-01-11 MED ORDER — SODIUM CHLORIDE 0.9 % IV BOLUS
500.0000 mL | Freq: Once | INTRAVENOUS | Status: AC
  Administered 2022-01-11: 500 mL via INTRAVENOUS

## 2022-01-11 MED ORDER — LIDOCAINE 5 % EX PTCH
1.0000 | MEDICATED_PATCH | Freq: Once | CUTANEOUS | Status: DC
  Administered 2022-01-11: 1 via TRANSDERMAL
  Filled 2022-01-11: qty 1

## 2022-01-11 MED ORDER — ACETAMINOPHEN 325 MG PO TABS
650.0000 mg | ORAL_TABLET | Freq: Once | ORAL | Status: AC
  Administered 2022-01-11: 650 mg via ORAL
  Filled 2022-01-11: qty 2

## 2022-01-11 MED ORDER — LORAZEPAM 1 MG PO TABS: 0.5000 mg | ORAL_TABLET | Freq: Once | ORAL | Status: DC | PRN

## 2022-01-11 NOTE — ED Provider Notes (Addendum)
[name] EMERGENCY DEPARTMENT Provider Note   CSN: [id number] Arrival date & time: 01/11/22  2043  An emergency department physician performed an initial assessment on this suspected stroke patient at 2046.  History {Add pertinent medical, surgical, social history, OB history to HPI:1} Chief Complaint  Patient presents with   Stroke Symptoms    From home for stroke sx, patient was just here on Sunday for a TIA, c/o slurred speech, L sided facial droop, nausea, and HA; LKW 1830    Sandra [name] is a 88 y.o. female.  Patient as above with significant medical history as below, including TIA, DM, ILD, GERD, HLD, SAH who presents to the ED with complaint of stroke like symptoms. Pt with multiple complaints, feeling lightheaded, having slurred speech, weakness, nausea, headache. LKN was around [place] She was recently admitted earlier this month 2/2 TIA Symptoms have been variable since the onset  Airway cleared at bridge and pt sent to Ct emergently with neurology team      Past Medical History:  Diagnosis Date   Achilles tendon injury 11/27/2010   ALLERGIC RHINITIS 05/12/2006   Qualifier: Diagnosis of  By: Larose Kells MD, [place]    Anxiety state 07/03/2009   Qualifier: Diagnosis of  By: Larose Kells MD, [place] pain 07/01/2010   Chronic kidney disease (CKD), stage III (moderate) ([place]) 05/31/2014   Chronic right SI joint pain 09/22/2013   DEGENERATIVE JOINT DISEASE, CERVICAL SPINE 06/23/2006   Annotation: had a CAT scan with a cervical myelogram that show  left C6-7 and  C5-6 foraminal stenosis Qualifier: Diagnosis of  By: Larose Kells MD, [place] 05/12/2006   Qualifier: Diagnosis of  By: Larose Kells MD, [place], TYPE II 05/12/2006   Now following w/ Endo at [facility] 04/27/2007   Qualifier: Diagnosis of  By: Jerold Coombe     Encephalopathy, hypertensive    Essential hypertension 05/12/2006   Qualifier: Diagnosis of   By: Larose Kells MD, [place]    GAIT DISTURBANCE 08/07/2009   Qualifier: Diagnosis of  By: Larose Kells MD, Alda Berthold.    GERD (gastroesophageal reflux disease)    Hyperlipidemia associated with type 2 diabetes mellitus ([place]) 05/12/2006   Qualifier: Diagnosis of  By: Larose Kells MD, Alda Berthold.    ILD (interstitial lung disease) ([place]) 05/31/2015   NECK PAIN, CHRONIC 04/03/2008   Qualifier: Diagnosis of  By: Larose Kells MD, [place]    Osteoarthritis 02/10/2016   Osteopenia    Osteoporosis 06/23/2006   Annotation: had a bone density test in 08-2004.  T score was -2.4 Qualifier: Diagnosis of  By: Larose Kells MD, Alda Berthold     [facility] (subarachnoid hemorrhage) ([place])    Scleroderma ([place]) 02/28/2016   Takotsubo cardiomyopathy    s/p stress MI with stress induced CM with normal coronary arteries by cath 2007 with normalization of LVF by echo 04/2005   TIA (transient ischemic attack)    Vasculitis of skin 09/28/2012   Vitamin D deficiency 10/01/2014    Past Surgical History:  Procedure Laterality Date   ABDOMINAL HYSTERECTOMY  1977   no oophorectomy   APPENDECTOMY     CARDIAC CATHETERIZATION     CATARACT EXTRACTION, BILATERAL  2011   SPINAL FUSION  2000   Dr. Vertell Limber   TONSILLECTOMY       The history is provided by the patient and the EMS personnel. No language interpreter was  used.       Home Medications Prior to Admission medications   Medication Sig Start Date End Date Taking? Authorizing Provider  acetaminophen (TYLENOL) 500 MG tablet Take 500 mg by mouth every 6 (six) hours as needed for mild pain or headache.    [provider]  amLODipine (NORVASC) 5 MG tablet Take 5 mg by mouth daily. 10/14/21   [provider]  aspirin EC 81 MG tablet Take 1 tablet (81 mg total) by mouth daily for 21 days. Swallow whole. 01/07/22 01/28/22  [place] Donnamarie Poag, DO  atorvastatin (LIPITOR) 80 MG tablet Take 1 tablet (80 mg total) by mouth every evening. 01/06/22 04/06/22  [place] Donnamarie Poag, DO  clonazePAM (KLONOPIN) 0.5 MG tablet Take 0.5  mg by mouth 2 (two) times daily as needed for anxiety. 03/10/21   [provider]  clopidogrel (PLAVIX) 75 MG tablet Take 1 tablet (75 mg total) by mouth daily. 01/07/22 04/07/22  [place] Donnamarie Poag, DO  famotidine (PEPCID) 40 MG tablet Take 0.5 tablets (20 mg total) by mouth daily. Patient taking differently: Take 40 mg by mouth at bedtime. 07/05/19 01/05/22  Love, Ivan Anchors, PA-C  glimepiride (AMARYL) 2 MG tablet Take 2 mg by mouth in the morning and at bedtime.    [provider]  insulin detemir (LEVEMIR) 100 UNIT/ML injection 30 units qAm and 15 units qPM 01/06/22   [place] Eric J, DO  lisinopril (ZESTRIL) 5 MG tablet Take 5 mg by mouth daily. 11/29/21   [provider]  metoprolol succinate (TOPROL-XL) 50 MG 24 hr tablet Take 1 tablet (50 mg total) by mouth daily. Take with or immediately following a meal. 07/05/19   Love, Ivan Anchors, PA-C  NOVOLOG FLEXPEN 100 UNIT/ML FlexPen Inject 10 Units into the skin 3 (three) times daily before meals. 02/18/21   [provider]      Allergies    Cephalexin, Nintedanib, Pioglitazone, and Sertraline    Review of Systems   Review of Systems  Constitutional:  Negative for activity change and fever.  HENT:  Negative for facial swelling and trouble swallowing.   Eyes:  Negative for discharge and redness.  Respiratory:  Negative for cough and shortness of breath.   Cardiovascular:  Negative for chest pain and palpitations.  Gastrointestinal:  Positive for nausea. Negative for abdominal pain.  Genitourinary:  Negative for dysuria and flank pain.  Musculoskeletal:  Negative for back pain and gait problem.  Skin:  Negative for pallor and rash.  Neurological:  Positive for facial asymmetry, weakness and headaches. Negative for syncope.    Physical Exam Updated Vital Signs BP (!) 131/99   Pulse 80   Temp 98.6 F (37 C) (Oral)   Resp (!) 22   Ht 5' (1.524 m)   Wt 60.3 kg   SpO2 100%   BMI 25.97 kg/m  Physical  Exam Vitals and nursing note reviewed.  Constitutional:      General: She is not in acute distress.    Appearance: Normal appearance. She is not ill-appearing, toxic-appearing or diaphoretic.  HENT:     Head: Normocephalic and atraumatic.     Right Ear: External ear normal.     Left Ear: External ear normal.     Nose: Nose normal.     Mouth/Throat:     Mouth: Mucous membranes are moist.  Eyes:     General: No scleral icterus.       Right eye: No discharge.  Left eye: No discharge.  Cardiovascular:     Rate and Rhythm: Normal rate and regular rhythm.     Pulses: Normal pulses.     Heart sounds: Normal heart sounds.  Pulmonary:     Effort: Pulmonary effort is normal. No respiratory distress.     Breath sounds: Normal breath sounds.  Abdominal:     General: Abdomen is flat.     Tenderness: There is no abdominal tenderness.  Musculoskeletal:        General: Normal range of motion.     Cervical back: Normal range of motion.     Right lower leg: No edema.     Left lower leg: No edema.  Skin:    General: Skin is warm and dry.     Capillary Refill: Capillary refill takes less than 2 seconds.  Neurological:     Mental Status: She is alert and oriented to person, place, and time.     Cranial Nerves: Facial asymmetry present.     Sensory: Sensation is intact.     Motor: Motor function is intact.     Coordination: Coordination is intact.     Comments: Question slight facial asymmetry  Gait not tested 2/2 pt safety   Psychiatric:        Mood and Affect: Mood normal.        Behavior: Behavior normal.     ED Results / Procedures / Treatments   Labs (all labs ordered are listed, but only abnormal results are displayed) Labs Reviewed  CBC - Abnormal; Notable for the following components:      Result Value   RBC 3.37 (*)    Hemoglobin 10.9 (*)    HCT 33.8 (*)    MCV 100.3 (*)    All other components within normal limits  COMPREHENSIVE METABOLIC PANEL - Abnormal; Notable  for the following components:   Glucose, Bld 183 (*)    BUN 61 (*)    Creatinine, Ser 2.38 (*)    GFR, Estimated 19 (*)    All other components within normal limits  I-STAT CHEM 8, ED - Abnormal; Notable for the following components:   BUN 58 (*)    Creatinine, Ser 2.50 (*)    Glucose, Bld 183 (*)    Calcium, Ion 1.08 (*)    TCO2 21 (*)    Hemoglobin 10.9 (*)    HCT 32.0 (*)    All other components within normal limits  CBG MONITORING, ED - Abnormal; Notable for the following components:   Glucose-Capillary 185 (*)    All other components within normal limits  DIFFERENTIAL  ETHANOL  PROTIME-INR  APTT    EKG None  Radiology CT HEAD CODE STROKE WO CONTRAST  Result Date: 01/11/2022 CLINICAL DATA:  Code stroke. Initial evaluation for neuro deficit, stroke suspected. EXAM: CT HEAD WITHOUT CONTRAST TECHNIQUE: Contiguous axial images were obtained from the base of the skull through the vertex without intravenous contrast. RADIATION DOSE REDUCTION: This exam was performed according to the departmental dose-optimization program which includes automated exposure control, adjustment of the mA and/or kV according to patient size and/or use of iterative reconstruction technique. COMPARISON:  Prior CT and MRI from 01/04/2022. FINDINGS: Brain: Cerebral volume within normal limits. Moderate chronic microvascular ischemic disease noted. Chronic right occipital encephalomalacia again noted, stable. No acute intracranial hemorrhage. No acute large vessel territory infarct. No mass lesion or midline shift. No hydrocephalus or extra-axial fluid collection. Vascular: No abnormal hyperdense vessel. Scattered vascular calcifications noted  within the carotid siphons. Skull: Scalp soft tissues and calvarium demonstrate no acute finding. Sinuses/Orbits: Globes orbital soft tissues demonstrate no acute finding. Scattered mucosal thickening noted about the ethmoidal air cells and maxillary sinuses. No mastoid  effusion. Other: None. ASPECTS [facility] Stroke Program Early CT Score) - Ganglionic level infarction (caudate, lentiform nuclei, internal capsule, insula, M1-M3 cortex): 7 - Supraganglionic infarction (M4-M6 cortex): 3 Total score (0-10 with 10 being normal): 10 IMPRESSION: 1. Stable head CT.  No acute intracranial abnormality. 2. ASPECTS is 10. These results were communicated to Dr. Leonel Ramsay at 9:03 pm on 01/11/2022 by text page via the [facility] messaging system. Electronically Signed   By: Jeannine Boga M.D.   On: 01/11/2022 21:05    Procedures Procedures  {Document cardiac monitor, telemetry assessment procedure when appropriate:1}  Medications Ordered in ED Medications  LORazepam (ATIVAN) tablet 0.5 mg (has no administration in time range)  sodium chloride flush (NS) 0.9 % injection 3 mL (3 mLs Intravenous Given 01/11/22 2123)    ED Course/ Medical Decision Making/ A&P                           Medical Decision Making Amount and/or Complexity of Data Reviewed Labs: ordered. Radiology: ordered.  Risk Prescription drug management.   Medical Decision Making:   Sandra [name] is a 88 y.o. female who presented to the ED today with *** detailed above.    {crccomplexity:27900} Complete initial physical exam performed, notably the patient  was ***.    Reviewed and confirmed nursing documentation for past medical history, family history, social history.  Vital signs were reviewed.   Initial Assessment:   This patient presents to the ED with chief complaint(s) of *** with pertinent past medical history of *** which further complicates the presenting complaint. The complaint involves an extensive differential diagnosis and also carries with it a high risk of complications and morbidity.  Serious etiology was considered. Ddx includes but is not limited to: *** {crccopa:27899}  Initial Plan:  ***  ***Screening labs including CBC and Metabolic panel to evaluate for infectious or  metabolic etiology of disease.  ***Urinalysis with reflex culture ordered to evaluate for UTI or relevant urologic/nephrologic pathology.  ***CXR to evaluate for structural/infectious intrathoracic pathology.  ***EKG to evaluate for cardiac pathology Objective evaluation as below reviewed   Initial Study Results:   Laboratory  All laboratory results reviewed without evidence of clinically relevant pathology.   ***Exceptions include: ***   ***EKG EKG was reviewed independently.   Radiology:   I independently visualized the following imaging with scope of interpretation limited to determining acute life threatening conditions related to emergency care: ***, which revealed ***. I agree with radiologist interpretation.   CT HEAD CODE STROKE WO CONTRAST  Result Date: 01/11/2022 CLINICAL DATA:  Code stroke. Initial evaluation for neuro deficit, stroke suspected. EXAM: CT HEAD WITHOUT CONTRAST TECHNIQUE: Contiguous axial images were obtained from the base of the skull through the vertex without intravenous contrast. RADIATION DOSE REDUCTION: This exam was performed according to the departmental dose-optimization program which includes automated exposure control, adjustment of the mA and/or kV according to patient size and/or use of iterative reconstruction technique. COMPARISON:  Prior CT and MRI from 01/04/2022. FINDINGS: Brain: Cerebral volume within normal limits. Moderate chronic microvascular ischemic disease noted. Chronic right occipital encephalomalacia again noted, stable. No acute intracranial hemorrhage. No acute large vessel territory infarct. No mass lesion or midline shift. No hydrocephalus or extra-axial fluid  collection. Vascular: No abnormal hyperdense vessel. Scattered vascular calcifications noted within the carotid siphons. Skull: Scalp soft tissues and calvarium demonstrate no acute finding. Sinuses/Orbits: Globes orbital soft tissues demonstrate no acute finding. Scattered mucosal  thickening noted about the ethmoidal air cells and maxillary sinuses. No mastoid effusion. Other: None. ASPECTS [facility] Stroke Program Early CT Score) - Ganglionic level infarction (caudate, lentiform nuclei, internal capsule, insula, M1-M3 cortex): 7 - Supraganglionic infarction (M4-M6 cortex): 3 Total score (0-10 with 10 being normal): 10 IMPRESSION: 1. Stable head CT.  No acute intracranial abnormality. 2. ASPECTS is 10. These results were communicated to Dr. Leonel Ramsay at 9:03 pm on 01/11/2022 by text page via the [facility] messaging system. Electronically Signed   By: Jeannine Boga M.D.   On: 01/11/2022 21:05   VAS US CAROTID  Result Date: 01/06/2022 Carotid Arterial Duplex Study Patient Name:  Sandra [name]  Date of Exam:   01/06/2022 Medical Rec #: [id number]      Accession #:    [id number] Date of Birth: 1933/10/25     Patient Gender: F Patient Age:   89 years Exam Location:  [facility] Procedure:      VAS US CAROTID Referring Phys: ERIC [place] --------------------------------------------------------------------------------  Indications:       TIA. Risk Factors:      Hypertension, hyperlipidemia, Diabetes. Comparison Study:  prior 03/18/21 Performing Technologist: Archie Patten RVS  Examination Guidelines: A complete evaluation includes B-mode imaging, spectral Doppler, color Doppler, and power Doppler as needed of all accessible portions of each vessel. Bilateral testing is considered an integral part of a complete examination. Limited examinations for reoccurring indications may be performed as noted.  Right Carotid Findings: +----------+--------+--------+--------+------------------+--------+           PSV cm/sEDV cm/sStenosisPlaque DescriptionComments +----------+--------+--------+--------+------------------+--------+ CCA Prox  56      8               heterogenous               +----------+--------+--------+--------+------------------+--------+ CCA Distal57      11               heterogenous               +----------+--------+--------+--------+------------------+--------+ ICA Prox  78      16      1-39%   heterogenous               +----------+--------+--------+--------+------------------+--------+ ICA Distal74      16                                tortuous +----------+--------+--------+--------+------------------+--------+ ECA       98                                                 +----------+--------+--------+--------+------------------+--------+ +----------+--------+-------+--------+-------------------+           PSV cm/sEDV cmsDescribeArm Pressure (mmHG) +----------+--------+-------+--------+-------------------+ [id number]                                         +----------+--------+-------+--------+-------------------+ +---------+--------+--+--------+--+---------+ VertebralPSV cm/s55EDV cm/s12Antegrade +---------+--------+--+--------+--+---------+  Left Carotid Findings: +----------+--------+--------+--------+------------------+--------+           PSV cm/sEDV cm/sStenosisPlaque DescriptionComments +----------+--------+--------+--------+------------------+--------+ CCA  Prox  114     15              heterogenous               +----------+--------+--------+--------+------------------+--------+ CCA Distal75      12              heterogenous               +----------+--------+--------+--------+------------------+--------+ ICA Prox  67      16      1-39%   heterogenous      tortuous +----------+--------+--------+--------+------------------+--------+ ICA Distal109     23                                tortuous +----------+--------+--------+--------+------------------+--------+ ECA       57                                                 +----------+--------+--------+--------+------------------+--------+ +----------+--------+--------+--------+-------------------+           PSV cm/sEDV  cm/sDescribeArm Pressure (mmHG) +----------+--------+--------+--------+-------------------+ [id number]                                          +----------+--------+--------+--------+-------------------+ +---------+--------+--+--------+-+---------+ VertebralPSV cm/s32EDV cm/s7Antegrade +---------+--------+--+--------+-+---------+   Summary: Right Carotid: Velocities in the right ICA are consistent with a 1-39% stenosis. Left Carotid: Velocities in the left ICA are consistent with a 1-39% stenosis. Vertebrals: Bilateral vertebral arteries demonstrate antegrade flow. *See table(s) above for measurements and observations.  Electronically signed by Servando Snare MD on 01/06/2022 at 4:15:47 PM.    Final    ECHOCARDIOGRAM COMPLETE  Result Date: 01/05/2022    ECHOCARDIOGRAM REPORT   Patient Name:   Sandra [name] Date of Exam: 01/05/2022 Medical Rec #:  [id number]     Height:       60.0 in Accession #:    [id number]    Weight:       135.1 lb Date of Birth:  15-Mar-1933    BSA:          1.580 m Patient Age:    61 years      BP:           131/52 mmHg Patient Gender: F             HR:           69 bpm. Exam Location:  Inpatient Procedure: 2D Echo, Color Doppler and Cardiac Doppler Indications:    TIA G45.9  History:        Patient has prior history of Echocardiogram examinations, most                 recent 03/18/2021. Cardiomyopathy, TIA; Risk                 Factors:Dyslipidemia, Hypertension, Diabetes and Non-Smoker.  Sonographer:    Greer Pickerel Referring Phys: [id number] JENNIFER YATES  Sonographer Comments: Suboptimal subcostal window. Image acquisition challenging due to patient body habitus and Image acquisition challenging due to respiratory motion. IMPRESSIONS  1. Left ventricular ejection fraction, by estimation, is 60 to 65%. The left ventricle has normal function. The left ventricle has no regional wall motion abnormalities. Left ventricular diastolic parameters  are consistent with Grade I diastolic  dysfunction (impaired relaxation).  2. Right ventricular systolic function is normal. The right ventricular size is normal. There is normal pulmonary artery systolic pressure. The estimated right ventricular systolic pressure is 40.8 mmHg.  3. Left atrial size was mildly dilated.  4. The mitral valve is grossly normal. Trivial mitral valve regurgitation. No evidence of mitral stenosis.  5. The aortic valve is tricuspid. There is mild calcification of the aortic valve. There is mild thickening of the aortic valve. Aortic valve regurgitation is not visualized. Aortic valve sclerosis is present, with no evidence of aortic valve stenosis.  6. The inferior vena cava is normal in size with greater than 50% respiratory variability, suggesting right atrial pressure of 3 mmHg. Comparison(s): No significant change from prior study. FINDINGS  Left Ventricle: Left ventricular ejection fraction, by estimation, is 60 to 65%. The left ventricle has normal function. The left ventricle has no regional wall motion abnormalities. The left ventricular internal cavity size was normal in size. There is  no left ventricular hypertrophy. Left ventricular diastolic parameters are consistent with Grade I diastolic dysfunction (impaired relaxation). Right Ventricle: The right ventricular size is normal. No increase in right ventricular wall thickness. Right ventricular systolic function is normal. There is normal pulmonary artery systolic pressure. The tricuspid regurgitant velocity is 1.71 m/s, and  with an assumed right atrial pressure of 3 mmHg, the estimated right ventricular systolic pressure is 14.4 mmHg. Left Atrium: Left atrial size was mildly dilated. Right Atrium: Right atrial size was normal in size. Pericardium: Trivial pericardial effusion is present. Presence of epicardial fat layer. Mitral Valve: The mitral valve is grossly normal. Trivial mitral valve regurgitation. No evidence of mitral valve stenosis. Tricuspid Valve: The  tricuspid valve is grossly normal. Tricuspid valve regurgitation is trivial. No evidence of tricuspid stenosis. Aortic Valve: The aortic valve is tricuspid. There is mild calcification of the aortic valve. There is mild thickening of the aortic valve. Aortic valve regurgitation is not visualized. Aortic valve sclerosis is present, with no evidence of aortic valve stenosis. Pulmonic Valve: The pulmonic valve was grossly normal. Pulmonic valve regurgitation is trivial. No evidence of pulmonic stenosis. Aorta: The aortic root and ascending aorta are structurally normal, with no evidence of dilitation. Venous: The inferior vena cava is normal in size with greater than 50% respiratory variability, suggesting right atrial pressure of 3 mmHg. IAS/Shunts: The atrial septum is grossly normal.  LEFT VENTRICLE PLAX 2D LVIDd:         3.60 cm   Diastology LVIDs:         2.40 cm   LV e' medial:    4.68 cm/s LV PW:         0.80 cm   LV E/e' medial:  21.2 LV IVS:        0.70 cm   LV e' lateral:   6.20 cm/s LVOT diam:     1.60 cm   LV E/e' lateral: 16.0 LV SV:         41 LV SV Index:   26 LVOT Area:     2.01 cm  RIGHT VENTRICLE RV S prime:     14.90 cm/s TAPSE (M-mode): 2.0 cm LEFT ATRIUM           Index        RIGHT ATRIUM           Index LA diam:      2.50 cm 1.58 cm/m   RA Area:  11.50 cm LA Vol (A4C): 67.1 ml 42.46 ml/m  RA Volume:   26.40 ml  16.71 ml/m  AORTIC VALVE LVOT Vmax:   75.10 cm/s LVOT Vmean:  47.600 cm/s LVOT VTI:    0.204 m  AORTA Ao Root diam: 3.00 cm Ao Asc diam:  3.20 cm MITRAL VALVE                TRICUSPID VALVE MV Area (PHT): 3.10 cm     TR Peak grad:   11.7 mmHg MV Decel Time: 245 msec     TR Vmax:        171.00 cm/s MV E velocity: 99.40 cm/s MV A velocity: 118.00 cm/s  SHUNTS MV E/A ratio:  0.84         Systemic VTI:  0.20 m                             Systemic Diam: 1.60 cm Eleonore Chiquito MD Electronically signed by Eleonore Chiquito MD Signature Date/Time: 01/05/2022/4:51:39 PM    Final    MR BRAIN  WO CONTRAST  Result Date: 01/04/2022 CLINICAL DATA:  Stroke, follow-up EXAM: MRI HEAD WITHOUT CONTRAST TECHNIQUE: Multiplanar, multiecho pulse sequences of the brain and surrounding structures were obtained without intravenous contrast. COMPARISON:  03/17/2021 MRI head, correlation is also made with 01/04/2022 CT head FINDINGS: Brain: No restricted diffusion to suggest acute or subacute infarct.No acute hemorrhage, mass, mass effect, or midline shift. No hydrocephalus or extra-axial collection. Redemonstrated chronic hemorrhage in the posterior right temporal and lateral right occipital lobe, with additional chronic hemorrhagic foci in the left posterior temporal and occipital lobe, bilateral frontal lobes, and right parietal lobe; these appear unchanged compared to the susceptibility weighted sequence from 08/29/2019, with the exception of some likely remote subarachnoid hemorrhage in the occipital lobes (series 7, image 40). Vascular: Patent arterial flow voids. Skull and upper cervical spine: Normal marrow signal. Sinuses/Orbits: Mucosal thickening in the left greater than right maxillary sinus and bilateral ethmoid air cells.Status post bilateral lens replacements. Other: Trace fluid in the mastoid air cells. IMPRESSION: 1. No acute intracranial process. No evidence of acute or subacute infarct. 2. Redemonstrated remote hemorrhage in the right temporal and occipital lobe, with additional chronic hemorrhagic foci and sequela of small vessel ischemic disease. Electronically Signed   By: Merilyn Baba M.D.   On: 01/04/2022 23:51   CT HEAD CODE STROKE WO CONTRAST  Result Date: 01/04/2022 CLINICAL DATA:  Code stroke. Neuro deficit, acute, stroke suspected. EXAM: CT HEAD WITHOUT CONTRAST TECHNIQUE: Contiguous axial images were obtained from the base of the skull through the vertex without intravenous contrast. RADIATION DOSE REDUCTION: This exam was performed according to the departmental dose-optimization  program which includes automated exposure control, adjustment of the mA and/or kV according to patient size and/or use of iterative reconstruction technique. COMPARISON:  CT head without contrast 03/17/2021. MR head without contrast 03/17/2021. FINDINGS: Brain: Remote right occipital lobe infarct is stable. Moderate diffuse white matter disease is similar the prior studies. No acute infarct, hemorrhage, or mass lesion is present. Basal ganglia are intact. Insular ribbon is normal. No acute cortical abnormality is present. The ventricles are proportionate to the degree of atrophy. No significant extraaxial fluid collection is present. The brainstem and cerebellum are within normal limits. Vascular: Atherosclerotic calcifications are present within the cavernous internal carotid arteries bilaterally. No hyperdense vessel is present. Skull: Calvarium is intact. No focal lytic or blastic lesions are  present. No significant extracranial soft tissue lesion is present. Sinuses/Orbits: The paranasal sinuses and mastoid air cells are clear. The globes and orbits are within normal limits. ASPECTS [facility] Stroke Program Early CT Score) - Ganglionic level infarction (caudate, lentiform nuclei, internal capsule, insula, M1-M3 cortex): 7/7 - Supraganglionic infarction (M4-M6 cortex): 3/3 Total score (0-10 with 10 being normal): 10/10 IMPRESSION: 1. No acute intracranial abnormality or significant interval change. 2. Stable remote right occipital lobe infarct. 3. Stable moderate diffuse white matter disease. This likely reflects the sequela of chronic microvascular ischemia. 4. Aspects is 10/10. The above was relayed via text pager to DR. ERIC LINDZEN on 01/04/2022 at 13:33 . Electronically Signed   By: San Morelle M.D.   On: 01/04/2022 13:34    Cardiac monitoring was reviewed and interpreted by myself which shows ***    Consults: Case discussed with ***.   ED course:     Reassessment: ***  Final Assessment  and Plan:   ***    Admission was considered    Clinical Impression: No diagnosis found.   Data Unavailable       Social Determinants of health: Social History   Tobacco Use   Smoking status: Never    Passive exposure: Yes   Smokeless tobacco: Never   Tobacco comments:    Mother & Children  Vaping Use   Vaping Use: Never used  Substance Use Topics   Alcohol use: Yes    Alcohol/week: 1.0 standard drink of alcohol    Types: 1 Glasses of wine per week    Comment: occ   Drug use: No                  This chart was dictated using voice recognition software.  Despite best efforts to proofread,  errors can occur which can change the documentation meaning.   {Document critical care time when appropriate:1} {Document review of labs and clinical decision tools ie heart score, Chads2Vasc2 etc:1}  {Document your independent review of radiology images, and any outside records:1} {Document your discussion with family members, caretakers, and with consultants:1} {Document social determinants of health affecting pt's care:1} {Document your decision making why or why not admission, treatments were needed:1} Final Clinical Impression(s) / ED Diagnoses Final diagnoses:  None    Rx / DC Orders ED Discharge Orders     None

## 2022-01-11 NOTE — Consult Note (Signed)
Neurology Consultation Reason for Consult: Slurred Speech Referring Physician: Earl Lites  CC: Slurred speech  History is obtained from: Patient, chart  HPI: Sandra [name] is a 88 y.o. female with a history of a recent admission for presumed TIA which presented as left facial droop and aphasia.  She states that tonight she got up and walked into the next room after laying down for a nap.  She then became lightheaded and started slurring her speech.  She states that she felt like she was about to pass out.  EMS was called, and she continued to feel lightheaded even during transport.  She is now improving.   LKW: 2 PM tnk given?: no, history of IPH  Past Medical History:  Diagnosis Date   Achilles tendon injury 11/27/2010   ALLERGIC RHINITIS 05/12/2006   Qualifier: Diagnosis of  By: Larose Kells MD, [place]    Anxiety state 07/03/2009   Qualifier: Diagnosis of  By: Larose Kells MD, [place] pain 07/01/2010   Chronic kidney disease (CKD), stage III (moderate) ([place]) 05/31/2014   Chronic right SI joint pain 09/22/2013   DEGENERATIVE JOINT DISEASE, CERVICAL SPINE 06/23/2006   Annotation: had a CAT scan with a cervical myelogram that show  left C6-7 and  C5-6 foraminal stenosis Qualifier: Diagnosis of  By: Larose Kells MD, [place] 05/12/2006   Qualifier: Diagnosis of  By: Larose Kells MD, [place], TYPE II 05/12/2006   Now following w/ Endo at [place] 04/27/2007   Qualifier: Diagnosis of  By: Jerold Coombe     Encephalopathy, hypertensive    Essential hypertension 05/12/2006   Qualifier: Diagnosis of  By: Larose Kells MD, [place]    GAIT DISTURBANCE 08/07/2009   Qualifier: Diagnosis of  By: Larose Kells MD, Alda Berthold.    GERD (gastroesophageal reflux disease)    Hyperlipidemia associated with type 2 diabetes mellitus ([place]) 05/12/2006   Qualifier: Diagnosis of  By: Larose Kells MD, [place]a Berthold.    ILD (interstitial lung disease) ([place]) 05/31/2015   NECK PAIN, CHRONIC 04/03/2008    Qualifier: Diagnosis of  By: Larose Kells MD, [place]    Osteoarthritis 02/10/2016   Osteopenia    Osteoporosis 06/23/2006   Annotation: had a bone density test in 08-2004.  T score was -2.4 Qualifier: Diagnosis of  By: Larose Kells MD, Alda Berthold     [facility] (subarachnoid hemorrhage) ([place])    Scleroderma ([place]) 02/28/2016   Takotsubo cardiomyopathy    s/p stress MI with stress induced CM with normal coronary arteries by cath 2007 with normalization of LVF by echo 04/2005   TIA (transient ischemic attack)    Vasculitis of skin 09/28/2012   Vitamin D deficiency 10/01/2014     Family History  Problem Relation Age of Onset   Intracerebral hemorrhage Daughter        assoc with post-partum   Stroke Mother    Diabetes Sister    Asthma Sister    Diabetes Sister    Breast cancer Other        ?Aunt   Cancer Other        breast?   Coronary artery disease Neg Hx      Social History:  reports that she has never smoked. She has been exposed to tobacco smoke. She has never used smokeless tobacco. She reports current alcohol use of about 1.0 standard drink of alcohol per week. She reports that she does  not use drugs.   Exam: Current vital signs: BP (!) 140/52   Pulse 73   Temp 98.6 F (37 C) (Oral)   Resp 19   Ht 5' (1.524 m)   Wt 60.3 kg   SpO2 99%   BMI 25.97 kg/m  Vital signs in last 24 hours: Temp:  [98.6 F (37 C)] 98.6 F (37 C) (12/24 2125) Pulse Rate:  [73-91] 73 (12/24 2200) Resp:  [18-22] 19 (12/24 2200) BP: (131-140)/(52-99) 140/52 (12/24 2200) SpO2:  [96 %-100 %] 99 % (12/24 2200) Weight:  [60.3 kg] 60.3 kg (12/24 2105)   Physical Exam  Appears well-developed and well-nourished.   Neuro: Mental Status: Patient is awake, alert, oriented to person, place, month, year, and situation. Patient is able to give a clear and coherent history. No signs of aphasia or neglect Cranial Nerves: II: Visual Fields are full. Pupils are equal, round, and reactive to light.   III,IV, VI: EOMI without  ptosis or diploplia.  V: Facial sensation is symmetric to temperature VII: Facial movement with possible mild left facial weakness VIII: hearing is intact to voice X: Uvula elevates symmetrically XI: Shoulder shrug is symmetric. XII: tongue is midline without atrophy or fasciculations.  Motor: Tone is normal. Bulk is normal. 5/5 strength was present in all four extremities.  Sensory: Sensation is symmetric to light touch and temperature in the arms and legs. Cerebellar: FNF intact bilaterally      I have reviewed labs in epic and the results pertinent to this consultation are: Creatinine 2.83  I have reviewed the images obtained: CT head-negative  Impression: 88 year old female with transient lightheadedness and slurred speech.  My suspicion is that this was likely orthostasis given that she had been laying down for prolonged period of time and then stood up.  She is not a candidate for tenecteplase given her history of intraparenchymal hematoma.  She seems to be doing better now.  Even if this was recurrent TIA, I am not sure if she would get additional benefit from readmission unless there was clear evidence of stroke on MRI.  I do think an MRI would be indicated, but if this is negative I would not pursue any further stroke workup at this time.  Recommendations: 1) MRI brain 2) if this is negative I would continue with the previously planned secondary stroke prevention as detailed in the note by Dr. Leonie Man on 12/19. 3) if MRI is negative, neurology will be available on an as-needed basis.   Roland Rack, MD [facility] [phone]  If 7pm- 7am, please page neurology on call as listed in [place].

## 2022-01-11 NOTE — ED Notes (Signed)
Trop added to previous blood draw in lab.

## 2022-01-11 NOTE — Discharge Instructions (Signed)
It was a pleasure caring for you today in the emergency department.  Please return to the emergency department for any worsening or worrisome symptoms.  Please follow up with your PCP

## 2022-01-11 NOTE — ED Notes (Signed)
Patient passed swallow test at this time. Did not choke at all with drinking water.

## 2022-01-11 NOTE — ED Notes (Signed)
Patient transported to MRI in NAD.

## 2022-01-11 NOTE — Code Documentation (Signed)
Stroke Response Nurse Documentation Code Documentation  Sandra [name] is a 88 y.o. female arriving to [facility]  via [facility] on 12/24 with past medical hx of TIA, HLD, DM2, SAH. On clopidogrel 75 mg daily. Code stroke was activated by EMS.   Patient from home where she was LKW at 1830 and now complaining of slurred speech and headache.    Stroke team at the bedside on patient arrival. Labs drawn and patient cleared for CT by Dr. Maryan Rued. Patient to CT with team. NIHSS 2, see documentation for details and code stroke times. Patient with disoriented and left facial droop on exam. The following imaging was completed:  CT Head. Patient is not a candidate for IV Thrombolytic due to non disabling symptoms. Patient is not a candidate for IR due to low suspicion for LVO.    Bedside handoff with ED RN Sandra [name].    Madelynn Done  Rapid Response RN

## 2022-01-12 NOTE — ED Provider Notes (Signed)
Patient signed out to me by Dr. Pearline Cables.  Patient with what sounds like a TIA earlier tonight.  Treatment plan was to perform MRI.  If MRI unchanged, no change to patient's medications or treatment plan, discharge and outpatient follow-up.  MRI has been performed and does not show acute stroke.  Patient will be discharged.   Orpah Greek, MD 01/12/22 [phone]

## 2022-01-19 DIAGNOSIS — K529 Noninfective gastroenteritis and colitis, unspecified: Secondary | ICD-10-CM

## 2022-01-19 HISTORY — DX: Noninfective gastroenteritis and colitis, unspecified: K52.9

## 2022-02-03 DIAGNOSIS — E782 Mixed hyperlipidemia: Secondary | ICD-10-CM | POA: Diagnosis not present

## 2022-02-03 DIAGNOSIS — N189 Chronic kidney disease, unspecified: Secondary | ICD-10-CM | POA: Diagnosis not present

## 2022-02-03 DIAGNOSIS — F418 Other specified anxiety disorders: Secondary | ICD-10-CM | POA: Diagnosis not present

## 2022-02-03 DIAGNOSIS — M1990 Unspecified osteoarthritis, unspecified site: Secondary | ICD-10-CM | POA: Diagnosis not present

## 2022-02-03 DIAGNOSIS — E1165 Type 2 diabetes mellitus with hyperglycemia: Secondary | ICD-10-CM | POA: Diagnosis not present

## 2022-02-03 DIAGNOSIS — F4310 Post-traumatic stress disorder, unspecified: Secondary | ICD-10-CM | POA: Diagnosis not present

## 2022-02-03 DIAGNOSIS — I10 Essential (primary) hypertension: Secondary | ICD-10-CM | POA: Diagnosis not present

## 2022-02-03 DIAGNOSIS — G459 Transient cerebral ischemic attack, unspecified: Secondary | ICD-10-CM | POA: Diagnosis not present

## 2022-02-03 DIAGNOSIS — I2510 Atherosclerotic heart disease of native coronary artery without angina pectoris: Secondary | ICD-10-CM | POA: Diagnosis not present

## 2022-02-12 ENCOUNTER — Ambulatory Visit: Payer: Medicare HMO | Admitting: Neurology

## 2022-02-12 ENCOUNTER — Encounter: Payer: Self-pay | Admitting: Neurology

## 2022-02-12 VITALS — BP 154/51 | HR 67 | Ht 60.0 in | Wt 130.0 lb

## 2022-02-12 DIAGNOSIS — G459 Transient cerebral ischemic attack, unspecified: Secondary | ICD-10-CM | POA: Diagnosis not present

## 2022-02-12 NOTE — Patient Instructions (Signed)
I had a long d/w patient and her daughter about her recent TIAs, risk for recurrent stroke/TIAs, personally independently reviewed imaging studies and stroke evaluation results and answered questions.Continue Plavix 75 mg daily for secondary stroke prevention and maintain strict control of hypertension with blood pressure goal below 130/90, diabetes with hemoglobin A1c goal below 6.5% and lipids with LDL cholesterol goal below 70 mg/dL. I also advised the patient to eat a healthy diet with plenty of whole grains, cereals, fruits and vegetables, exercise regularly and maintain ideal body weight I advised her to follow-up with her primary care physician for better blood pressure and diabetes control.  Followup in the future with me in 6 months or call earlier if necessary.  Stroke Prevention Some medical conditions and behaviors can lead to a higher chance of having a stroke. You can help prevent a stroke by eating healthy, exercising, not smoking, and managing any medical conditions you have. Stroke is a leading cause of functional impairment. Primary prevention is particularly important because a majority of strokes are first-time events. Stroke changes the lives of not only those who experience a stroke but also their family and other caregivers. How can this condition affect me? A stroke is a medical emergency and should be treated right away. A stroke can lead to brain damage and can sometimes be life-threatening. If a person gets medical treatment right away, there is a better chance of surviving and recovering from a stroke. What can increase my risk? The following medical conditions may increase your risk of a stroke: Cardiovascular disease. High blood pressure (hypertension). Diabetes. High cholesterol. Sickle cell disease. Blood clotting disorders (hypercoagulable state). Obesity. Sleep disorders (obstructive sleep apnea). Other risk factors include: Being older than age 33. Having a  history of blood clots, stroke, or mini-stroke (transient ischemic attack, TIA). Genetic factors, such as race, ethnicity, or a family history of stroke. Smoking cigarettes or using other tobacco products. Taking birth control pills, especially if you also use tobacco. Heavy use of alcohol or drugs, especially cocaine and methamphetamine. Physical inactivity. What actions can I take to prevent this? Manage your health conditions High cholesterol levels. Eating a healthy diet is important for preventing high cholesterol. If cholesterol cannot be managed through diet alone, you may need to take medicines. Take any prescribed medicines to control your cholesterol as told by your health care provider. Hypertension. To reduce your risk of stroke, try to keep your blood pressure below 130/80. Eating a healthy diet and exercising regularly are important for controlling blood pressure. If these steps are not enough to manage your blood pressure, you may need to take medicines. Take any prescribed medicines to control hypertension as told by your health care provider. Ask your health care provider if you should monitor your blood pressure at home. Have your blood pressure checked every year, even if your blood pressure is normal. Blood pressure increases with age and some medical conditions. Diabetes. Eating a healthy diet and exercising regularly are important parts of managing your blood sugar (glucose). If your blood sugar cannot be managed through diet and exercise, you may need to take medicines. Take any prescribed medicines to control your diabetes as told by your health care provider. Get evaluated for obstructive sleep apnea. Talk to your health care provider about getting a sleep evaluation if you snore a lot or have excessive sleepiness. Make sure that any other medical conditions you have, such as atrial fibrillation or atherosclerosis, are managed. Nutrition Follow instructions from your  health care provider about what to eat or drink to help manage your health condition. These instructions may include: Reducing your daily calorie intake. Limiting how much salt (sodium) you use to 1,500 milligrams (mg) each day. Using only healthy fats for cooking, such as olive oil, canola oil, or sunflower oil. Eating healthy foods. You can do this by: Choosing foods that are high in fiber, such as whole grains, and fresh fruits and vegetables. Eating at least 5 servings of fruits and vegetables a day. Try to fill one-half of your plate with fruits and vegetables at each meal. Choosing lean protein foods, such as lean cuts of meat, poultry without skin, fish, tofu, beans, and nuts. Eating low-fat dairy products. Avoiding foods that are high in sodium. This can help lower blood pressure. Avoiding foods that have saturated fat, trans fat, and cholesterol. This can help prevent high cholesterol. Avoiding processed and prepared foods. Counting your daily carbohydrate intake.  Lifestyle If you drink alcohol: Limit how much you have to: 0-1 drink a day for women who are not pregnant. 0-2 drinks a day for men. Know how much alcohol is in your drink. In the [place] one drink equals one 12 oz bottle of beer (370m), one 5 oz glass of wine (1436m, or one 1 oz glass of hard liquor (4463m Do not use any products that contain nicotine or tobacco. These products include cigarettes, chewing tobacco, and vaping devices, such as e-cigarettes. If you need help quitting, ask your health care provider. Avoid secondhand smoke. Do not use drugs. Activity  Try to stay at a healthy weight. Get at least 30 minutes of exercise on most days, such as: Fast walking. Biking. Swimming. Medicines Take over-the-counter and prescription medicines only as told by your health care provider. Aspirin or blood thinners (antiplatelets or anticoagulants) may be recommended to reduce your risk of forming blood clots that  can lead to stroke. Avoid taking birth control pills. Talk to your health care provider about the risks of taking birth control pills if: You are over 35 70ars old. You smoke. You get very bad headaches. You have had a blood clot. Where to find more information American Stroke Association: www.strokeassociation.org Get help right away if: You or a loved one has any symptoms of a stroke. "BE FAST" is an easy way to remember the main warning signs of a stroke: B - Balance. Signs are dizziness, sudden trouble walking, or loss of balance. E - Eyes. Signs are trouble seeing or a sudden change in vision. F - Face. Signs are sudden weakness or numbness of the face, or the face or eyelid drooping on one side. A - Arms. Signs are weakness or numbness in an arm. This happens suddenly and usually on one side of the body. S - Speech. Signs are sudden trouble speaking, slurred speech, or trouble understanding what people say. T - Time. Time to call emergency services. Write down what time symptoms started. You or a loved one has other signs of a stroke, such as: A sudden, severe headache with no known cause. Nausea or vomiting. Seizure. These symptoms may represent a serious problem that is an emergency. Do not wait to see if the symptoms will go away. Get medical help right away. Call your local emergency services (911 in the [place] Do not drive yourself to the hospital. Summary You can help to prevent a stroke by eating healthy, exercising, not smoking, limiting alcohol intake, and managing any medical conditions you may have.  Do not use any products that contain nicotine or tobacco. These include cigarettes, chewing tobacco, and vaping devices, such as e-cigarettes. If you need help quitting, ask your health care provider. Remember "BE FAST" for warning signs of a stroke. Get help right away if you or a loved one has any of these signs. This information is not intended to replace advice given to you  by your health care provider. Make sure you discuss any questions you have with your health care provider. Document Revised: 08/07/2019 Document Reviewed: 08/07/2019 Elsevier Patient Education  [place].

## 2022-02-12 NOTE — Progress Notes (Signed)
[facility] [address] [place]. [place] [address] [phone]       OFFICE FOLLOW-UP NOTE  [name] Date of Birth:  1934-01-09 Medical Record Number:  [id number]   HPI: [name] is a pleasant 89 year old Caucasian lady seen today for initial office follow-up visit following hospital consultation for TIA in December 2023.  She is accompanied by her daughter today.  History is obtained from them and review of electronic medical records.  I have personally reviewed pertinent available imaging films in PACS.Sandra [name] is a 89 y.o. female with past medical history of MI, CKD, DM, HTN, HA, HLD, SAH, TIA, ICH w/SDH 2021, and cognitive impairment.  she was at the [facility] attempting to order a drink when she suddenly developed inability to speak, with left facial droop on 01/04/2022. Code stroke was activated by [facility]. LKW 1210. CT head no acute process, ASPECTS 10.  IV thrombolysis was considered because of low NIH stroke scale and rapidly improving exam.  MRI scan of the brain showed no acute intracranial process.  There is remote areas of hemorrhage in the right temporal and occipital lobe with chronic hemorrhagic foci sequelae of small vessel disease bilaterally.  Carotid ultrasound showed bilateral 1-39% stenosis.  2D echo showed ejection fraction of 60 to 65%.  LDL cholesterol was elevated at 102 and hemoglobin A1c at 10.0.  Patient had been on aspirin prior to admission he was discharged on aspirin and Plavix for 3 weeks followed by Plavix alone.  She has done well since discharge without any other recurrent episodes of TIA or stroke.  Her short-term memory continues to be for activities of daily living.  Lives with her daughter.  She only needs some help with her medications and her ability.  She is tolerating Plavix well without bruising or bleeding.  Her blood pressure is under good control today it is 134/51.  She has is tolerating Lipitor well without muscle  aches and pains.  She has no new complaints    ROS:   14 system review of systems is positive for facial droop, speech difficulty, memory loss all other systems negative  PMH:  Past Medical History:  Diagnosis Date   Achilles tendon injury 11/27/2010   ALLERGIC RHINITIS 05/12/2006   Qualifier: Diagnosis of  By: Larose Kells MD, [place]    Anxiety state 07/03/2009   Qualifier: Diagnosis of  By: Larose Kells MD, [place] pain 07/01/2010   Chronic kidney disease (CKD), stage III (moderate) ([place]) 05/31/2014   Chronic right SI joint pain 09/22/2013   DEGENERATIVE JOINT DISEASE, CERVICAL SPINE 06/23/2006   Annotation: had a CAT scan with a cervical myelogram that show  left C6-7 and  C5-6 foraminal stenosis Qualifier: Diagnosis of  By: Larose Kells MD, [place] 05/12/2006   Qualifier: Diagnosis of  By: Larose Kells MD, [place], TYPE II 05/12/2006   Now following w/ Endo at [facility] 04/27/2007   Qualifier: Diagnosis of  By: Jerold Coombe     Encephalopathy, hypertensive    Essential hypertension 05/12/2006   Qualifier: Diagnosis of  By: Larose Kells MD, [place]    GAIT DISTURBANCE 08/07/2009   Qualifier: Diagnosis of  By: Larose Kells MD, [place] Berthold.    GERD (gastroesophageal reflux disease)    Hyperlipidemia associated with type 2 diabetes mellitus ([place]) 05/12/2006   Qualifier: Diagnosis of  By: Larose Kells MD, [place]  ILD (interstitial lung disease) ([place]) 05/31/2015   NECK PAIN, CHRONIC 04/03/2008   Qualifier: Diagnosis of  By: Larose Kells MD, [place]    Osteoarthritis 02/10/2016   Osteopenia    Osteoporosis 06/23/2006   Annotation: had a bone density test in 08-2004.  T score was -2.4 Qualifier: Diagnosis of  By: Larose Kells MD, [place]a Berthold     [facility] (subarachnoid hemorrhage) ([place])    Scleroderma ([place]) 02/28/2016   Takotsubo cardiomyopathy    s/p stress MI with stress induced CM with normal coronary arteries by cath 2007 with normalization of LVF by echo 04/2005   TIA (transient ischemic  attack)    Vasculitis of skin 09/28/2012   Vitamin D deficiency 10/01/2014    Social History:  Social History   Socioeconomic History   Marital status: Divorced    Spouse name: Not on file   Number of children: 2   Years of education: Not on file   Highest education level: Not on file  Occupational History   Occupation: Retired    Comment: Accounting  Tobacco Use   Smoking status: Never    Passive exposure: Yes   Smokeless tobacco: Never   Tobacco comments:    Mother & Children  Vaping Use   Vaping Use: Never used  Substance and Sexual Activity   Alcohol use: Yes    Alcohol/week: 1.0 standard drink of alcohol    Types: 1 Glasses of wine per week    Comment: occ   Drug use: No   Sexual activity: Never  Other Topics Concern   Not on file  Social History Narrative   10/21/20 Lives with her daughter and G-daughter   Diet- working on portion control   Exercise- no routine exercise but active      [facility]:   Originally from [place]. Always lived in [place]. Previously has traveled to [place], [place], [place], [place], & [place]. Previously did accounting and clerical work. Has a dog currently. Remote exposure to a parakeet. No mold exposure.    Social Determinants of Health   Financial Resource Strain: Not on file  Food Insecurity: No Food Insecurity (01/05/2022)   Hunger Vital Sign    Worried About Running Out of Food in the Last Year: Never true    Ran Out of Food in the Last Year: Never true  Transportation Needs: No Transportation Needs (01/05/2022)   PRAPARE - Hydrologist (Medical): No    Lack of Transportation (Non-Medical): No  Physical Activity: Not on file  Stress: Not on file  Social Connections: Not on file  Intimate Partner Violence: Not At Risk (01/05/2022)   Humiliation, Afraid, Rape, and Kick questionnaire    Fear of Current or Ex-Partner: No    Emotionally Abused: No    Physically Abused: No    Sexually Abused: No     Medications:   Current Outpatient Medications on File Prior to Visit  Medication Sig Dispense Refill   acetaminophen (TYLENOL) 500 MG tablet Take 500 mg by mouth every 6 (six) hours as needed for mild pain or headache.     amLODipine (NORVASC) 5 MG tablet Take 5 mg by mouth daily.     ASPIRIN PO Take 1 tablet by mouth daily.     atorvastatin (LIPITOR) 80 MG tablet Take 80 mg by mouth daily.     clonazePAM (KLONOPIN) 0.5 MG tablet Take 0.5 mg by mouth 2 (two) times daily as needed for anxiety.  clopidogrel (PLAVIX) 75 MG tablet Take 1 tablet (75 mg total) by mouth daily. 30 tablet 2   famotidine (PEPCID) 40 MG tablet Take 0.5 tablets (20 mg total) by mouth daily. (Patient taking differently: Take 40 mg by mouth at bedtime.) 15 tablet 0   glimepiride (AMARYL) 2 MG tablet Take 2 mg by mouth in the morning and at bedtime.     lisinopril (ZESTRIL) 5 MG tablet Take 5 mg by mouth daily.     metoprolol succinate (TOPROL-XL) 50 MG 24 hr tablet Take 1 tablet (50 mg total) by mouth daily. Take with or immediately following a meal. 30 tablet 0   NOVOLOG FLEXPEN 100 UNIT/ML FlexPen Inject 10 Units into the skin 3 (three) times daily before meals.     No current facility-administered medications on file prior to visit.    Allergies:   Allergies  Allergen Reactions   Cephalexin Nausea And Vomiting    Pt stated made severely sick, will never take again   Nintedanib Diarrhea   Pioglitazone Other (See Comments)    REACTION: EDEMA   Sertraline Nausea And Vomiting    Physical Exam General: Pleasant frail elderly Caucasian lady, seated, in no evident distress Head: head normocephalic and atraumatic.  Neck: supple with no carotid or supraclavicular bruits Cardiovascular: regular rate and rhythm, no murmurs Musculoskeletal: no deformity Skin:  no rash/petichiae Vascular:  Normal pulses all extremities Vitals:   02/12/22 0855  BP: (!) 154/51  Pulse: 67   Neurologic Exam Mental Status:  Awake and fully alert. Oriented to place and time. Recent and remote memory intact. Attention span, concentration and fund of knowledge appropriate. Mood and affect appropriate.  Cranial Nerves: Fundoscopic exam reveals sharp disc margins. Pupils equal, briskly reactive to light. Extraocular movements full without nystagmus. Visual fields  show left inferior quadrant vision loss in the left eye to confrontation. Hearing intact. Facial sensation intact. Face, tongue, palate moves normally and symmetrically.  Motor: Normal bulk and tone. Normal strength in all tested extremity muscles. Sensory.: intact to touch ,pinprick .position and vibratory sensation.  Coordination: Rapid alternating movements normal in all extremities. Finger-to-nose and heel-to-shin performed accurately bilaterally. Gait and Station: Arises from chair without difficulty. Stance is normal. Gait demonstrates normal stride length and balance . Able to heel, toe and tandem walk with great difficulty.  Reflexes: 1+ and symmetric. Toes downgoing.   NIHSS  1 Modified Rankin  0   ASSESSMENT: 89 year old lady with transient episode of left facial droop and speech difficulties likely due to right brain subcortical TIA in December 2023 from small vessel disease.  Vascular risk factors of hypertension, hyperlipidemia, diabetes and age     PLAN: I had a long d/w patient and her daughter about her recent TIAs, risk for recurrent stroke/TIAs, personally independently reviewed imaging studies and stroke evaluation results and answered questions.Continue Plavix 75 mg daily for secondary stroke prevention and maintain strict control of hypertension with blood pressure goal below 130/90, diabetes with hemoglobin A1c goal below 6.5% and lipids with LDL cholesterol goal below 70 mg/dL. I also advised the patient to eat a healthy diet with plenty of whole grains, cereals, fruits and vegetables, exercise regularly and maintain ideal body weight I  advised her to follow-up with her primary care physician for better blood pressure and diabetes control.  Followup in the future with me in 6 months or call earlier if necessary. Greater than 50% of time during this 35 minute visit was spent on counseling,explanation of diagnosis TIA,  planning of further management, discussion with patient and family and coordination of care Antony Contras,  MD Note: This document was prepared with digital dictation and possible smart phrase technology. Any transcriptional errors that result from this process are unintentional

## 2022-02-27 DIAGNOSIS — I129 Hypertensive chronic kidney disease with stage 1 through stage 4 chronic kidney disease, or unspecified chronic kidney disease: Secondary | ICD-10-CM | POA: Diagnosis not present

## 2022-02-27 DIAGNOSIS — Z7984 Long term (current) use of oral hypoglycemic drugs: Secondary | ICD-10-CM | POA: Diagnosis not present

## 2022-02-27 DIAGNOSIS — Z794 Long term (current) use of insulin: Secondary | ICD-10-CM | POA: Diagnosis not present

## 2022-02-27 DIAGNOSIS — E1121 Type 2 diabetes mellitus with diabetic nephropathy: Secondary | ICD-10-CM | POA: Diagnosis not present

## 2022-02-27 DIAGNOSIS — E11649 Type 2 diabetes mellitus with hypoglycemia without coma: Secondary | ICD-10-CM | POA: Diagnosis not present

## 2022-02-27 DIAGNOSIS — E113293 Type 2 diabetes mellitus with mild nonproliferative diabetic retinopathy without macular edema, bilateral: Secondary | ICD-10-CM | POA: Diagnosis not present

## 2022-02-27 DIAGNOSIS — E1142 Type 2 diabetes mellitus with diabetic polyneuropathy: Secondary | ICD-10-CM | POA: Diagnosis not present

## 2022-02-27 DIAGNOSIS — E1165 Type 2 diabetes mellitus with hyperglycemia: Secondary | ICD-10-CM | POA: Diagnosis not present

## 2022-02-27 DIAGNOSIS — E1122 Type 2 diabetes mellitus with diabetic chronic kidney disease: Secondary | ICD-10-CM | POA: Diagnosis not present

## 2022-03-10 DIAGNOSIS — N1832 Chronic kidney disease, stage 3b: Secondary | ICD-10-CM | POA: Diagnosis not present

## 2022-03-10 DIAGNOSIS — N390 Urinary tract infection, site not specified: Secondary | ICD-10-CM | POA: Diagnosis not present

## 2022-03-13 DIAGNOSIS — E1169 Type 2 diabetes mellitus with other specified complication: Secondary | ICD-10-CM | POA: Diagnosis not present

## 2022-03-13 DIAGNOSIS — Z794 Long term (current) use of insulin: Secondary | ICD-10-CM | POA: Diagnosis not present

## 2022-03-13 DIAGNOSIS — E559 Vitamin D deficiency, unspecified: Secondary | ICD-10-CM | POA: Diagnosis not present

## 2022-03-13 DIAGNOSIS — E1165 Type 2 diabetes mellitus with hyperglycemia: Secondary | ICD-10-CM | POA: Diagnosis not present

## 2022-03-13 DIAGNOSIS — E785 Hyperlipidemia, unspecified: Secondary | ICD-10-CM | POA: Diagnosis not present

## 2022-03-17 DIAGNOSIS — N1832 Chronic kidney disease, stage 3b: Secondary | ICD-10-CM | POA: Diagnosis not present

## 2022-03-17 DIAGNOSIS — E875 Hyperkalemia: Secondary | ICD-10-CM | POA: Diagnosis not present

## 2022-03-17 DIAGNOSIS — D631 Anemia in chronic kidney disease: Secondary | ICD-10-CM | POA: Diagnosis not present

## 2022-03-17 DIAGNOSIS — E1122 Type 2 diabetes mellitus with diabetic chronic kidney disease: Secondary | ICD-10-CM | POA: Diagnosis not present

## 2022-03-17 DIAGNOSIS — N2581 Secondary hyperparathyroidism of renal origin: Secondary | ICD-10-CM | POA: Diagnosis not present

## 2022-03-17 DIAGNOSIS — N179 Acute kidney failure, unspecified: Secondary | ICD-10-CM | POA: Diagnosis not present

## 2022-03-17 DIAGNOSIS — I2510 Atherosclerotic heart disease of native coronary artery without angina pectoris: Secondary | ICD-10-CM | POA: Diagnosis not present

## 2022-03-17 DIAGNOSIS — J8489 Other specified interstitial pulmonary diseases: Secondary | ICD-10-CM | POA: Diagnosis not present

## 2022-03-17 DIAGNOSIS — I129 Hypertensive chronic kidney disease with stage 1 through stage 4 chronic kidney disease, or unspecified chronic kidney disease: Secondary | ICD-10-CM | POA: Diagnosis not present

## 2022-04-29 ENCOUNTER — Encounter (HOSPITAL_COMMUNITY): Payer: Self-pay

## 2022-04-29 ENCOUNTER — Ambulatory Visit (HOSPITAL_COMMUNITY)
Admission: EM | Admit: 2022-04-29 | Discharge: 2022-04-29 | Disposition: A | Discharge status: Home / Self Care | Payer: Medicare HMO

## 2022-04-29 DIAGNOSIS — N1832 Chronic kidney disease, stage 3b: Secondary | ICD-10-CM

## 2022-04-29 DIAGNOSIS — B029 Zoster without complications: Secondary | ICD-10-CM | POA: Diagnosis not present

## 2022-04-29 MED ORDER — VALACYCLOVIR HCL 1 G PO TABS: 1000.0000 mg | ORAL_TABLET | Freq: Every day | ORAL | 0 refills | Status: AC

## 2022-04-29 NOTE — ED Provider Notes (Signed)
[facility]    CSN: [id number] Arrival date & time: 04/29/22  9244      History   Chief Complaint Chief Complaint  Patient presents with   Rash    HPI Sandra [name] is a 89 y.o. female.   Patient presents to urgent care with her daughter who contributes to the history for evaluation of rash to the left side/flank that started 4 days ago. States the rash is painful with burning sensation. No one else in the household has rash.  No recent changes in soaps, laundry detergents, shampoos, or personal hygiene products.  No recent exposure to poisonous plants to her knowledge.  She has never had shingles in the past, however has a friend who has frequent shingles infections and believes this is what is causing her rash.  She has been vaccinated against shingles "many years ago".  No recent antibiotic or steroid use reported.  She heard that Abreva might help with her rash so she tried this and states that she has had some relief over the last 4 days.      Past Medical History:  Diagnosis Date   Achilles tendon injury 11/27/2010   ALLERGIC RHINITIS 05/12/2006   Qualifier: Diagnosis of  By: Drue Novel MD, Nolon Rod.    Anxiety state 07/03/2009   Qualifier: Diagnosis of  By: Drue Novel MD, Jose E.    Back pain 07/01/2010   Chronic kidney disease (CKD), stage III (moderate) 05/31/2014   Chronic right SI joint pain 09/22/2013   DEGENERATIVE JOINT DISEASE, CERVICAL SPINE 06/23/2006   Annotation: had a CAT scan with a cervical myelogram that show  left C6-7 and  C5-6 foraminal stenosis Qualifier: Diagnosis of  By: Drue Novel MD, Nolon Rod.    DEPRESSION 05/12/2006   Qualifier: Diagnosis of  By: Drue Novel MD, Jose E.    DIABETES MELLITUS, TYPE II 05/12/2006   Now following w/ Endo at [facility]     DIABETIC  RETINOPATHY 04/27/2007   Qualifier: Diagnosis of  By: Janit Bern     Encephalopathy, hypertensive    Essential hypertension 05/12/2006   Qualifier: Diagnosis of  By: Drue Novel MD, Nolon Rod.    GAIT  DISTURBANCE 08/07/2009   Qualifier: Diagnosis of  By: Drue Novel MD, Nolon Rod.    GERD (gastroesophageal reflux disease)    Hyperlipidemia associated with type 2 diabetes mellitus 05/12/2006   Qualifier: Diagnosis of  By: Drue Novel MD, Nolon Rod.    ILD (interstitial lung disease) 05/31/2015   NECK PAIN, CHRONIC 04/03/2008   Qualifier: Diagnosis of  By: Drue Novel MD, Nolon Rod.    Osteoarthritis 02/10/2016   Osteopenia    Osteoporosis 06/23/2006   Annotation: had a bone density test in 08-2004.  T score was -2.4 Qualifier: Diagnosis of  By: Drue Novel MD, Nolon Rod     [facility] (subarachnoid hemorrhage)    Scleroderma 02/28/2016   Takotsubo cardiomyopathy    s/p stress MI with stress induced CM with normal coronary arteries by cath 2007 with normalization of LVF by echo 04/2005   TIA (transient ischemic attack)    Vasculitis of skin 09/28/2012   Vitamin D deficiency 10/01/2014    Patient Active Problem List   Diagnosis Date Noted   DNR (do not resuscitate) 01/04/2022   Acute kidney injury superimposed on chronic kidney disease 03/18/2021   Type 2 diabetes mellitus with chronic kidney disease, with long-term current use of insulin 03/18/2021   Anxiety 03/18/2021   Dyslipidemia 03/18/2021   Depression 03/18/2021  Uncontrolled type 2 diabetes mellitus with hyperglycemia    Benign essential HTN    Hypoalbuminemia due to protein-calorie malnutrition    Macrocytosis    Acute blood loss anemia    Diabetic retinopathy of left eye associated with type 2 diabetes mellitus    Cognitive impairment 06/22/2019   Lobar cerebral hemorrhage 06/22/2019   ICH (intracerebral hemorrhage) (HCC) - R occipital - HTN vs CAA 06/18/2019   TIA (transient ischemic attack) 04/26/2019   Diarrhea 04/24/2019   Encephalopathy, hypertensive    Acute encephalopathy 03/17/2018   Scleroderma 02/28/2016   Dyspnea 02/10/2016   Abnormal blood finding 02/10/2016   CAD (coronary artery disease) 02/10/2016   Osteoarthritis 02/10/2016   Nodule on liver  12/06/2015   ILD (interstitial lung disease) 05/31/2015   Type 2 diabetes mellitus with hyperglycemia 03/11/2015   Diabetes mellitus type 2 with retinopathy 03/04/2015   Vitamin D deficiency 10/01/2014   Leukocytopenia 05/31/2014   Chronic kidney disease (CKD), stage III (moderate) 05/31/2014   General weakness 05/31/2014   Lower abdominal pain 05/17/2014   Nausea with vomiting 09/22/2013   Chronic right SI joint pain 09/22/2013   Vasculitis of skin 09/28/2012   Abrasion of ear canal 09/28/2012   History of subarachnoid hemorrhage 04/07/2012   Unspecified cerebral artery occlusion with cerebral infarction 04/07/2012   UTI (urinary tract infection) 11/02/2011   General medical examination 01/07/2011   Chest pain 11/27/2010   Achilles tendon injury 11/27/2010   Back pain 07/01/2010   Breast pain 04/15/2010   GAIT DISTURBANCE 08/07/2009   Anxiety state 07/03/2009   VALVULAR HEART DISEASE 05/23/2008   NECK PAIN, CHRONIC 04/03/2008   DEGENERATIVE JOINT DISEASE, CERVICAL SPINE 06/23/2006   Osteoporosis 06/23/2006   Hyperlipidemia associated with type 2 diabetes mellitus 05/12/2006   DEPRESSION 05/12/2006   Essential hypertension 05/12/2006   ALLERGIC RHINITIS 05/12/2006   ACID REFLUX DISEASE 05/12/2006   SCIATICA 05/12/2006    Past Surgical History:  Procedure Laterality Date   ABDOMINAL HYSTERECTOMY  1977   no oophorectomy   APPENDECTOMY     CARDIAC CATHETERIZATION     CATARACT EXTRACTION, BILATERAL  2011   SPINAL FUSION  2000   Dr. Venetia Maxon   TONSILLECTOMY      OB History   No obstetric history on file.      Home Medications    Prior to Admission medications   Medication Sig Start Date End Date Taking? Authorizing Provider  acetaminophen (TYLENOL) 500 MG tablet Take 500 mg by mouth every 6 (six) hours as needed for mild pain or headache.   Yes [provider]  amLODipine (NORVASC) 5 MG tablet Take 5 mg by mouth daily. 10/14/21  Yes [provider]   ASPIRIN PO Take 1 tablet by mouth daily.   Yes [provider]  atorvastatin (LIPITOR) 80 MG tablet Take 80 mg by mouth daily.   Yes [provider]  clonazePAM (KLONOPIN) 0.5 MG tablet Take 0.5 mg by mouth 2 (two) times daily as needed for anxiety. 03/10/21  Yes [provider]  clopidogrel (PLAVIX) 75 MG tablet Take 75 mg by mouth daily. 04/16/22  Yes [provider]  famotidine (PEPCID) 40 MG tablet Take 0.5 tablets (20 mg total) by mouth daily. Patient taking differently: Take 40 mg by mouth at bedtime. 07/05/19 04/29/22 Yes Love, Evlyn Kanner, PA-C  glimepiride (AMARYL) 2 MG tablet Take 2 mg by mouth in the morning and at bedtime.   Yes [provider]  lisinopril (ZESTRIL) 5 MG  tablet Take 5 mg by mouth daily. 11/29/21  Yes [provider]  metoprolol succinate (TOPROL-XL) 50 MG 24 hr tablet Take 1 tablet (50 mg total) by mouth daily. Take with or immediately following a meal. 07/05/19  Yes Love, Evlyn Kanner, PA-C  NOVOLOG FLEXPEN 100 UNIT/ML FlexPen Inject 10 Units into the skin 3 (three) times daily before meals. 02/18/21  Yes [provider]  valACYclovir (VALTREX) 1000 MG tablet Take 1 tablet (1,000 mg total) by mouth daily for 7 days. 04/29/22 05/06/22 Yes [place] Duerst, Donavan Burnet, FNP    Family History Family History  Problem Relation Age of Onset   Intracerebral hemorrhage Daughter        assoc with post-partum   Stroke Mother    Diabetes Sister    Asthma Sister    Diabetes Sister    Breast cancer Other        ?Aunt   Cancer Other        breast?   Coronary artery disease Neg Hx     Social History Social History   Tobacco Use   Smoking status: Never    Passive exposure: Yes   Smokeless tobacco: Never   Tobacco comments:    Mother & Children  Vaping Use   Vaping Use: Never used  Substance Use Topics   Alcohol use: Yes    Alcohol/week: 1.0 standard drink of alcohol    Types: 1 Glasses of wine per week    Comment: occ    Drug use: No     Allergies   Cephalexin, Nintedanib, Pioglitazone, and Sertraline   Review of Systems Review of Systems Per HPI  Physical Exam Triage Vital Signs ED Triage Vitals  Enc Vitals Group     BP 04/29/22 0826 112/66     Pulse Rate 04/29/22 0826 87     Resp 04/29/22 0826 18     Temp 04/29/22 0826 98.1 F (36.7 C)     Temp Source 04/29/22 0826 Oral     SpO2 04/29/22 0826 98 %     Weight 04/29/22 0825 130 lb 1.1 oz (59 kg)     Height 04/29/22 0825 5' (1.524 m)     Head Circumference --      Peak Flow --      Pain Score 04/29/22 0821 8     Pain Loc --      Pain Edu? --      Excl. in GC? --    No data found.  Updated Vital Signs BP 112/66 (BP Location: Right Arm)   Pulse 87   Temp 98.1 F (36.7 C) (Oral)   Resp 18   Ht 5' (1.524 m)   Wt 130 lb 1.1 oz (59 kg)   SpO2 98%   BMI 25.40 kg/m   Visual Acuity Right Eye Distance:   Left Eye Distance:   Bilateral Distance:    Right Eye Near:   Left Eye Near:    Bilateral Near:     Physical Exam Vitals and nursing note reviewed.  Constitutional:      Appearance: She is not ill-appearing or toxic-appearing.  HENT:     Head: Normocephalic and atraumatic.     Right Ear: Hearing and external ear normal.     Left Ear: Hearing and external ear normal.     Nose: Nose normal.     Mouth/Throat:     Lips: Pink.     Mouth: Mucous membranes are moist. No injury.     Tongue:  No lesions. Tongue does not deviate from midline.     Palate: No mass and lesions.     Pharynx: Oropharynx is clear. Uvula midline. No pharyngeal swelling, oropharyngeal exudate, posterior oropharyngeal erythema or uvula swelling.     Tonsils: No tonsillar exudate or tonsillar abscesses.  Eyes:     General: Lids are normal. Vision grossly intact. Gaze aligned appropriately.     Extraocular Movements: Extraocular movements intact.     Conjunctiva/sclera: Conjunctivae normal.  Cardiovascular:     Rate and Rhythm: Normal rate and regular  rhythm.     Heart sounds: Normal heart sounds, S1 normal and S2 normal.  Pulmonary:     Effort: Pulmonary effort is normal. No respiratory distress.     Breath sounds: Normal breath sounds and air entry.  Musculoskeletal:     Cervical back: Neck supple.  Skin:    General: Skin is warm and dry.     Capillary Refill: Capillary refill takes less than 2 seconds.     Findings: Erythema and rash present. Rash is vesicular.     Comments: Diffuse papular vesicular rash on an erythematous base present to the contiguous dermatomes of the left flank/left side as seen in image below.  Rash covers the left T12 through left L2 dermatome radiating into the left groin.  Rash is warm and tender to touch.    Neurological:     General: No focal deficit present.     Mental Status: She is alert and oriented to person, place, and time. Mental status is at baseline.     Cranial Nerves: No dysarthria or facial asymmetry.  Psychiatric:        Mood and Affect: Mood normal.        Speech: Speech normal.        Behavior: Behavior normal.        Thought Content: Thought content normal.        Judgment: Judgment normal.        UC Treatments / Results  Labs (all labs ordered are listed, but only abnormal results are displayed) Labs Reviewed - No data to display  EKG   Radiology No results found.  Procedures Procedures (including critical care time)  Medications Ordered in UC Medications - No data to display  Initial Impression / Assessment and Plan / UC Course  I have reviewed the triage vital signs and the nursing notes.  Pertinent labs & imaging results that were available during my care of the patient were reviewed by me and considered in my medical decision making (see chart for details).   1.  Herpes zoster without complication, stage IIIb chronic kidney disease Presentation is consistent with acute herpes zoster rash across the lower thoracic/upper lumbar left dermatome .  Patient suffers  from chronic kidney disease stage IIIb.  Creatinine clearance is 21 based on most recent CMP from March 13, 2022.  Per up-to-date guidelines, patient may have Valtrex 1 g once daily for the next 7 days.  Low suspicion for of secondary bacterial rash at this time, therefore will defer antibiotic therapy.  However, if symptoms fail to improve in the next 3 to 5 days with use of Valtrex, advised patient to return to urgent care for reevaluation. Advised PCP follow-up in the next 1-2 weeks to ensure improvement in rash. May use Tylenol as needed for pain.  No indication for referral to the emergency department at this time.  No ocular involvement.  She is overall nontoxic in appearance with  hemodynamically stable vital signs and stable for outpatient management of this.  Patient is interested in establishing care with a new primary care provider.  PCP assistance initiated.   Discussed physical exam and available lab work findings in clinic with patient.  Counseled patient regarding appropriate use of medications and potential side effects for all medications recommended or prescribed today. Discussed red flag signs and symptoms of worsening condition,when to call the PCP office, return to urgent care, and when to seek higher level of care in the emergency department. Patient verbalizes understanding and agreement with plan. All questions answered. Patient discharged in stable condition.    Final Clinical Impressions(s) / UC Diagnoses   Final diagnoses:  Herpes zoster without complication  Stage 3b chronic kidney disease     Discharge Instructions      Take valtrex every 24 hours for the next 7 days to treat shingles rash. If your symptoms do not improve in the next 2-3 days with use of the antiviral, please return to urgent care. Please follow-up with your primary care provider in the next 1-2 weeks.  If you develop any new or worsening symptoms or do not improve in the next 2 to 3 days, please  return.  If your symptoms are severe, please go to the emergency room.  Follow-up with your primary care provider for further evaluation and management of your symptoms as well as ongoing wellness visits.  I hope you feel better!   ED Prescriptions     Medication Sig Dispense Auth. Provider   valACYclovir (VALTREX) 1000 MG tablet Take 1 tablet (1,000 mg total) by mouth daily for 7 days. 7 tablet Carlisle BeersStanhope, Levan Aloia M, FNP      PDMP not reviewed this encounter.   Carlisle BeersStanhope, Aarion Metzgar M, [place]NP 04/29/22 [phone]

## 2022-04-29 NOTE — ED Triage Notes (Addendum)
Rash onset 4 days ago. Patient thinks she has shingles. Going down the lower left side.  Using otc Abreva with moderate relief.

## 2022-04-29 NOTE — Discharge Instructions (Addendum)
Take valtrex every 24 hours for the next 7 days to treat shingles rash. If your symptoms do not improve in the next 2-3 days with use of the antiviral, please return to urgent care. Please follow-up with your primary care provider in the next 1-2 weeks.  If you develop any new or worsening symptoms or do not improve in the next 2 to 3 days, please return.  If your symptoms are severe, please go to the emergency room.  Follow-up with your primary care provider for further evaluation and management of your symptoms as well as ongoing wellness visits.  I hope you feel better!

## 2022-05-06 ENCOUNTER — Encounter (HOSPITAL_COMMUNITY): Payer: Self-pay | Admitting: *Deleted

## 2022-05-06 ENCOUNTER — Ambulatory Visit (HOSPITAL_COMMUNITY)
Admission: EM | Admit: 2022-05-06 | Discharge: 2022-05-06 | Disposition: A | Discharge status: Home / Self Care | Payer: Medicare HMO | Attending: Emergency Medicine | Admitting: Emergency Medicine

## 2022-05-06 DIAGNOSIS — L03312 Cellulitis of back [any part except buttock]: Secondary | ICD-10-CM

## 2022-05-06 DIAGNOSIS — B028 Zoster with other complications: Secondary | ICD-10-CM | POA: Diagnosis not present

## 2022-05-06 MED ORDER — SULFAMETHOXAZOLE-TRIMETHOPRIM 800-160 MG PO TABS: 1.0000 | ORAL_TABLET | Freq: Two times a day (BID) | ORAL | 0 refills | Status: DC

## 2022-05-06 MED ORDER — ONDANSETRON 4 MG PO TBDP: 4.0000 mg | ORAL_TABLET | Freq: Three times a day (TID) | ORAL | 0 refills | Status: DC | PRN

## 2022-05-06 MED ORDER — DOXYCYCLINE HYCLATE 100 MG PO CAPS: 100.0000 mg | ORAL_CAPSULE | Freq: Two times a day (BID) | ORAL | 0 refills | Status: DC

## 2022-05-06 NOTE — Discharge Instructions (Addendum)
Unfortunately pain from shingles can last for weeks.  You can take Tylenol up to 3 times daily for your pain.  I am sending in some antibiotics (doxycycline) to help cover against skin infection.  Please take the doxycycline twice daily, take it with food to help prevent gastrointestinal upset.  For her nausea she can take the Zofran. Please encourage oral hydration with water and an electrolyte solution like Pedialyte.   Please follow-up with a primary care provider to get her routine medications refilled.  If she is developing a mental health crisis, she can go to the behavioral health urgent care.  Please return to clinic or seek immediate care if she develops shortness of breath, high fever, confusion, or any new concerning symptoms.

## 2022-05-06 NOTE — ED Provider Notes (Signed)
[facility]    CSN: [id number] Arrival date & time: 05/06/22  8295      History   Chief Complaint Chief Complaint  Patient presents with   Follow-up    HPI Sandra [name] is a 89 y.o. female.   Patient presents to clinic requesting a refill on her home medications including clonazepam.  Reports she has been out of her home medications for about 4 weeks.  Does not have a primary care provider.  Requesting that we schedule her an appointment with a primary care provider.  She also reports continued left flank pain from a shingles outbreak.  Had completed antiviral therapy.  Reports hot and cold chills, being sick on her stomach. Has also not had a BM but only one time since the 7th.   She is agitated in the room.    The history is provided by the patient, medical records and a caregiver.    Past Medical History:  Diagnosis Date   Achilles tendon injury 11/27/2010   ALLERGIC RHINITIS 05/12/2006   Qualifier: Diagnosis of  By: Drue Novel MD, Nolon Rod.    Anxiety state 07/03/2009   Qualifier: Diagnosis of  By: Drue Novel MD, Jose E.    Back pain 07/01/2010   Chronic kidney disease (CKD), stage III (moderate) 05/31/2014   Chronic right SI joint pain 09/22/2013   DEGENERATIVE JOINT DISEASE, CERVICAL SPINE 06/23/2006   Annotation: had a CAT scan with a cervical myelogram that show  left C6-7 and  C5-6 foraminal stenosis Qualifier: Diagnosis of  By: Drue Novel MD, Nolon Rod.    DEPRESSION 05/12/2006   Qualifier: Diagnosis of  By: Drue Novel MD, Jose E.    DIABETES MELLITUS, TYPE II 05/12/2006   Now following w/ Endo at [facility]     DIABETIC  RETINOPATHY 04/27/2007   Qualifier: Diagnosis of  By: Janit Bern     Encephalopathy, hypertensive    Essential hypertension 05/12/2006   Qualifier: Diagnosis of  By: Drue Novel MD, Nolon Rod.    GAIT DISTURBANCE 08/07/2009   Qualifier: Diagnosis of  By: Drue Novel MD, Nolon Rod.    GERD (gastroesophageal reflux disease)    Hyperlipidemia associated with type 2 diabetes  mellitus 05/12/2006   Qualifier: Diagnosis of  By: Drue Novel MD, Nolon Rod.    ILD (interstitial lung disease) 05/31/2015   NECK PAIN, CHRONIC 04/03/2008   Qualifier: Diagnosis of  By: Drue Novel MD, Nolon Rod.    Osteoarthritis 02/10/2016   Osteopenia    Osteoporosis 06/23/2006   Annotation: had a bone density test in 08-2004.  T score was -2.4 Qualifier: Diagnosis of  By: Drue Novel MD, Nolon Rod     [facility] (subarachnoid hemorrhage)    Scleroderma 02/28/2016   Takotsubo cardiomyopathy    s/p stress MI with stress induced CM with normal coronary arteries by cath 2007 with normalization of LVF by echo 04/2005   TIA (transient ischemic attack)    Vasculitis of skin 09/28/2012   Vitamin D deficiency 10/01/2014    Patient Active Problem List   Diagnosis Date Noted   DNR (do not resuscitate) 01/04/2022   Acute kidney injury superimposed on chronic kidney disease 03/18/2021   Type 2 diabetes mellitus with chronic kidney disease, with long-term current use of insulin 03/18/2021   Anxiety 03/18/2021   Dyslipidemia 03/18/2021   Depression 03/18/2021   Uncontrolled type 2 diabetes mellitus with hyperglycemia    Benign essential HTN    Hypoalbuminemia due to protein-calorie malnutrition    Macrocytosis    Acute  blood loss anemia    Diabetic retinopathy of left eye associated with type 2 diabetes mellitus    Cognitive impairment 06/22/2019   Lobar cerebral hemorrhage 06/22/2019   ICH (intracerebral hemorrhage) (HCC) - R occipital - HTN vs CAA 06/18/2019   TIA (transient ischemic attack) 04/26/2019   Diarrhea 04/24/2019   Encephalopathy, hypertensive    Acute encephalopathy 03/17/2018   Scleroderma 02/28/2016   Dyspnea 02/10/2016   Abnormal blood finding 02/10/2016   CAD (coronary artery disease) 02/10/2016   Osteoarthritis 02/10/2016   Nodule on liver 12/06/2015   ILD (interstitial lung disease) 05/31/2015   Type 2 diabetes mellitus with hyperglycemia 03/11/2015   Diabetes mellitus type 2 with retinopathy  03/04/2015   Vitamin D deficiency 10/01/2014   Leukocytopenia 05/31/2014   Chronic kidney disease (CKD), stage III (moderate) 05/31/2014   General weakness 05/31/2014   Lower abdominal pain 05/17/2014   Nausea with vomiting 09/22/2013   Chronic right SI joint pain 09/22/2013   Vasculitis of skin 09/28/2012   Abrasion of ear canal 09/28/2012   History of subarachnoid hemorrhage 04/07/2012   Unspecified cerebral artery occlusion with cerebral infarction 04/07/2012   UTI (urinary tract infection) 11/02/2011   General medical examination 01/07/2011   Chest pain 11/27/2010   Achilles tendon injury 11/27/2010   Back pain 07/01/2010   Breast pain 04/15/2010   GAIT DISTURBANCE 08/07/2009   Anxiety state 07/03/2009   VALVULAR HEART DISEASE 05/23/2008   NECK PAIN, CHRONIC 04/03/2008   DEGENERATIVE JOINT DISEASE, CERVICAL SPINE 06/23/2006   Osteoporosis 06/23/2006   Hyperlipidemia associated with type 2 diabetes mellitus 05/12/2006   DEPRESSION 05/12/2006   Essential hypertension 05/12/2006   ALLERGIC RHINITIS 05/12/2006   ACID REFLUX DISEASE 05/12/2006   SCIATICA 05/12/2006    Past Surgical History:  Procedure Laterality Date   ABDOMINAL HYSTERECTOMY  1977   no oophorectomy   APPENDECTOMY     CARDIAC CATHETERIZATION     CATARACT EXTRACTION, BILATERAL  2011   SPINAL FUSION  2000   Dr. Venetia Maxon   TONSILLECTOMY      OB History   No obstetric history on file.      Home Medications    Prior to Admission medications   Medication Sig Start Date End Date Taking? Authorizing Provider  doxycycline (VIBRAMYCIN) 100 MG capsule Take 1 capsule (100 mg total) by mouth 2 (two) times daily. 05/06/22  Yes Rinaldo Ratel, [place] N, FNP  ondansetron (ZOFRAN-ODT) 4 MG disintegrating tablet Take 1 tablet (4 mg total) by mouth every 8 (eight) hours as needed for nausea or vomiting. 05/06/22  Yes Rinaldo Ratel, [place] N, FNP  acetaminophen (TYLENOL) 500 MG tablet Take 500 mg by mouth every 6 (six) hours as  needed for mild pain or headache.    [provider]  amLODipine (NORVASC) 5 MG tablet Take 5 mg by mouth daily. 10/14/21   [provider]  ASPIRIN PO Take 1 tablet by mouth daily.    [provider]  atorvastatin (LIPITOR) 80 MG tablet Take 80 mg by mouth daily.    [provider]  clonazePAM (KLONOPIN) 0.5 MG tablet Take 0.5 mg by mouth 2 (two) times daily as needed for anxiety. 03/10/21   [provider]  clopidogrel (PLAVIX) 75 MG tablet Take 75 mg by mouth daily. 04/16/22   [provider]  famotidine (PEPCID) 40 MG tablet Take 0.5 tablets (20 mg total) by mouth daily. Patient taking differently: Take 40 mg by mouth at bedtime. 07/05/19 04/29/22  Jacquelynn Cree, PA-C  glimepiride (AMARYL) 2 MG tablet Take 2 mg by mouth in the morning and at bedtime.    [provider]  lisinopril (ZESTRIL) 5 MG tablet Take 5 mg by mouth daily. 11/29/21   [provider]  metoprolol succinate (TOPROL-XL) 50 MG 24 hr tablet Take 1 tablet (50 mg total) by mouth daily. Take with or immediately following a meal. 07/05/19   Love, Evlyn Kanner, PA-C  NOVOLOG FLEXPEN 100 UNIT/ML FlexPen Inject 10 Units into the skin 3 (three) times daily before meals. 02/18/21   [provider]  valACYclovir (VALTREX) 1000 MG tablet Take 1 tablet (1,000 mg total) by mouth daily for 7 days. 04/29/22 05/06/22  Carlisle Beers, FNP    Family History Family History  Problem Relation Age of Onset   Intracerebral hemorrhage Daughter        assoc with post-partum   Stroke Mother    Diabetes Sister    Asthma Sister    Diabetes Sister    Breast cancer Other        ?Aunt   Cancer Other        breast?   Coronary artery disease Neg Hx     Social History Social History   Tobacco Use   Smoking status: Never    Passive exposure: Yes   Smokeless tobacco: Never   Tobacco comments:    Mother & Children  Vaping Use   Vaping Use: Never used  Substance Use  Topics   Alcohol use: Yes    Alcohol/week: 1.0 standard drink of alcohol    Types: 1 Glasses of wine per week    Comment: occ   Drug use: No     Allergies   Cephalexin, Nintedanib, Pioglitazone, and Sertraline   Review of Systems Review of Systems  Constitutional:  Positive for chills. Negative for fever.  HENT:  Negative for sore throat.   Respiratory:  Negative for cough and shortness of breath.   Cardiovascular:  Negative for chest pain.  Gastrointestinal:  Positive for nausea. Negative for abdominal pain, diarrhea and vomiting.  Skin:  Positive for rash and wound.     Physical Exam Triage Vital Signs ED Triage Vitals  Enc Vitals Group     BP      Pulse      Resp      Temp      Temp src      SpO2      Weight      Height      Head Circumference      Peak Flow      Pain Score      Pain Loc      Pain Edu?      Excl. in GC?    No data found.  Updated Vital Signs BP (!) 153/70 (BP Location: Right Arm)   Pulse 61   Temp 97.8 F (36.6 C) (Oral)   Resp 18   SpO2 95%   Visual Acuity Right Eye Distance:   Left Eye Distance:   Bilateral Distance:    Right Eye Near:   Left Eye Near:    Bilateral Near:     Physical Exam Vitals and nursing note reviewed.  Constitutional:      Appearance: Normal appearance.  HENT:     Head: Normocephalic and atraumatic.     Right Ear: External ear normal.     Left Ear: External ear normal.     Nose: Nose normal.  Eyes:  General: No scleral icterus.       Right eye: No discharge.        Left eye: No discharge.     Conjunctiva/sclera: Conjunctivae normal.  Cardiovascular:     Rate and Rhythm: Normal rate and regular rhythm.  Pulmonary:     Effort: Pulmonary effort is normal. No respiratory distress.  Musculoskeletal:        General: No swelling. Normal range of motion.  Skin:    General: Skin is warm and dry.     Findings: Erythema and rash present.          Comments: No new vesicles, lesions are crusted  over, extensive erythema and tenderness across right flank  Neurological:     General: No focal deficit present.     Mental Status: She is alert and oriented to person, place, and time.  Psychiatric:        Behavior: Behavior is cooperative.      UC Treatments / Results  Labs (all labs ordered are listed, but only abnormal results are displayed) Labs Reviewed - No data to display  EKG   Radiology No results found.  Procedures Procedures (including critical care time)  Medications Ordered in UC Medications - No data to display  Initial Impression / Assessment and Plan / UC Course  I have reviewed the triage vital signs and the nursing notes.  Pertinent labs & imaging results that were available during my care of the patient were reviewed by me and considered in my medical decision making (see chart for details).  Vitals in triage reviewed, patient is hemodynamically stable.  Patient is visibly anxious in the room and angry.  Requesting refill of her clonazepam which she has been out of for over 4 weeks now.  Discussed that we cannot refill controlled substances, staff assisted with scheduling a visit with primary care provider.  Given resources by behavioral health urgent care if needed.  Area on right flank consistent with healing shingles and early cellulitis.  Patient afebrile and without tachycardia, low concern for systemic involvement like sepsis.  Will cover with doxycycline and advised to take Tylenol as needed for pain. ODT Zofran PRN for nausea.   Return and follow-up precautions reviewed, patient caregiver verbalized understanding, no questions at this time.    Final Clinical Impressions(s) / UC Diagnoses   Final diagnoses:  Cellulitis of back except buttock  Herpes zoster with other complication     Discharge Instructions      Unfortunately pain from shingles can last for weeks.  You can take Tylenol up to 3 times daily for your pain.  I am sending in some  antibiotics (doxycycline) to help cover against skin infection.  Please take the doxycycline twice daily, take it with food to help prevent gastrointestinal upset.  For her nausea she can take the Zofran. Please encourage oral hydration with water and an electrolyte solution like Pedialyte.   Please follow-up with a primary care provider to get her routine medications refilled.  If she is developing a mental health crisis, she can go to the behavioral health urgent care.  Please return to clinic or seek immediate care if she develops shortness of breath, high fever, confusion, or any new concerning symptoms.      ED Prescriptions     Medication Sig Dispense Auth. Provider   sulfamethoxazole-trimethoprim (BACTRIM DS) 800-160 MG tablet  (Status: Discontinued) Take 1 tablet by mouth 2 (two) times daily for 7 days. 14 tablet [place], [place]  N, FNP   ondansetron (ZOFRAN-ODT) 4 MG disintegrating tablet Take 1 tablet (4 mg total) by mouth every 8 (eight) hours as needed for nausea or vomiting. 20 tablet Rinaldo Ratel, [place] N, [place]   doxycycline (VIBRAMYCIN) 100 MG capsule Take 1 capsule (100 mg total) by mouth 2 (two) times daily. 20 capsule Raeshawn Tafolla, [place] N, [place]      PDMP not reviewed this encounter.   Rinaldo Ratel [place] N, [place] 05/06/22 [phone]

## 2022-05-06 NOTE — ED Triage Notes (Signed)
Pt states she was dx with shingles on 04/29/2022. Pt states she is no better and was advised if she wasn't better to come back. Pt states she doesn't have a pcp anymore.    She states she needs a refill on her anxiety meds as well.

## 2022-05-08 ENCOUNTER — Emergency Department (HOSPITAL_COMMUNITY)
Admission: EM | Admit: 2022-05-08 | Discharge: 2022-05-08 | Discharge status: Against Medical Advice | Payer: Medicare HMO | Attending: Emergency Medicine | Admitting: Emergency Medicine

## 2022-05-08 DIAGNOSIS — Z5321 Procedure and treatment not carried out due to patient leaving prior to being seen by health care provider: Secondary | ICD-10-CM | POA: Insufficient documentation

## 2022-05-08 DIAGNOSIS — R21 Rash and other nonspecific skin eruption: Secondary | ICD-10-CM | POA: Diagnosis not present

## 2022-05-08 NOTE — ED Notes (Signed)
Staff unable to locate pt in the lobby

## 2022-05-20 DIAGNOSIS — N1832 Chronic kidney disease, stage 3b: Secondary | ICD-10-CM | POA: Diagnosis not present

## 2022-05-20 DIAGNOSIS — N189 Chronic kidney disease, unspecified: Secondary | ICD-10-CM | POA: Diagnosis not present

## 2022-05-25 DIAGNOSIS — I129 Hypertensive chronic kidney disease with stage 1 through stage 4 chronic kidney disease, or unspecified chronic kidney disease: Secondary | ICD-10-CM | POA: Diagnosis not present

## 2022-05-25 DIAGNOSIS — M359 Systemic involvement of connective tissue, unspecified: Secondary | ICD-10-CM | POA: Diagnosis not present

## 2022-05-25 DIAGNOSIS — D631 Anemia in chronic kidney disease: Secondary | ICD-10-CM | POA: Diagnosis not present

## 2022-05-25 DIAGNOSIS — E1122 Type 2 diabetes mellitus with diabetic chronic kidney disease: Secondary | ICD-10-CM | POA: Diagnosis not present

## 2022-05-25 DIAGNOSIS — N1832 Chronic kidney disease, stage 3b: Secondary | ICD-10-CM | POA: Diagnosis not present

## 2022-05-25 DIAGNOSIS — N2581 Secondary hyperparathyroidism of renal origin: Secondary | ICD-10-CM | POA: Diagnosis not present

## 2022-05-25 DIAGNOSIS — I2510 Atherosclerotic heart disease of native coronary artery without angina pectoris: Secondary | ICD-10-CM | POA: Diagnosis not present

## 2022-05-25 DIAGNOSIS — N179 Acute kidney failure, unspecified: Secondary | ICD-10-CM | POA: Diagnosis not present

## 2022-05-25 DIAGNOSIS — E875 Hyperkalemia: Secondary | ICD-10-CM | POA: Diagnosis not present

## 2022-06-02 ENCOUNTER — Ambulatory Visit (INDEPENDENT_AMBULATORY_CARE_PROVIDER_SITE_OTHER): Payer: Medicare HMO | Admitting: Family

## 2022-06-02 ENCOUNTER — Other Ambulatory Visit: Payer: Self-pay

## 2022-06-02 ENCOUNTER — Encounter: Payer: Self-pay | Admitting: Family

## 2022-06-02 VITALS — BP 140/74 | HR 61 | Temp 96.5°F | Resp 16 | Ht 60.0 in | Wt 120.4 lb

## 2022-06-02 DIAGNOSIS — E1169 Type 2 diabetes mellitus with other specified complication: Secondary | ICD-10-CM

## 2022-06-02 DIAGNOSIS — F410 Panic disorder [episodic paroxysmal anxiety] without agoraphobia: Secondary | ICD-10-CM | POA: Diagnosis not present

## 2022-06-02 DIAGNOSIS — K219 Gastro-esophageal reflux disease without esophagitis: Secondary | ICD-10-CM | POA: Diagnosis not present

## 2022-06-02 DIAGNOSIS — Z8673 Personal history of transient ischemic attack (TIA), and cerebral infarction without residual deficits: Secondary | ICD-10-CM

## 2022-06-02 DIAGNOSIS — N289 Disorder of kidney and ureter, unspecified: Secondary | ICD-10-CM | POA: Insufficient documentation

## 2022-06-02 DIAGNOSIS — Z7689 Persons encountering health services in other specified circumstances: Secondary | ICD-10-CM | POA: Diagnosis not present

## 2022-06-02 DIAGNOSIS — I129 Hypertensive chronic kidney disease with stage 1 through stage 4 chronic kidney disease, or unspecified chronic kidney disease: Secondary | ICD-10-CM

## 2022-06-02 DIAGNOSIS — F411 Generalized anxiety disorder: Secondary | ICD-10-CM

## 2022-06-02 DIAGNOSIS — E1122 Type 2 diabetes mellitus with diabetic chronic kidney disease: Secondary | ICD-10-CM

## 2022-06-02 DIAGNOSIS — N184 Chronic kidney disease, stage 4 (severe): Secondary | ICD-10-CM | POA: Diagnosis not present

## 2022-06-02 DIAGNOSIS — G8929 Other chronic pain: Secondary | ICD-10-CM

## 2022-06-02 DIAGNOSIS — M542 Cervicalgia: Secondary | ICD-10-CM | POA: Diagnosis not present

## 2022-06-02 DIAGNOSIS — Z794 Long term (current) use of insulin: Secondary | ICD-10-CM

## 2022-06-02 DIAGNOSIS — F419 Anxiety disorder, unspecified: Secondary | ICD-10-CM | POA: Diagnosis not present

## 2022-06-02 DIAGNOSIS — M545 Low back pain, unspecified: Secondary | ICD-10-CM | POA: Diagnosis not present

## 2022-06-02 DIAGNOSIS — E785 Hyperlipidemia, unspecified: Secondary | ICD-10-CM

## 2022-06-02 DIAGNOSIS — F3289 Other specified depressive episodes: Secondary | ICD-10-CM | POA: Diagnosis not present

## 2022-06-02 MED ORDER — CLONAZEPAM 0.5 MG PO TABS: 0.5000 mg | ORAL_TABLET | Freq: Two times a day (BID) | ORAL | 0 refills | Status: DC | PRN

## 2022-06-02 NOTE — Progress Notes (Signed)
Provider: Richarda Blade FNP-C   Stephania Macfarlane, Donalee Citrin, NP  Patient Care Team: Tarita Deshmukh, Donalee Citrin, NP as PCP - General (Family Medicine) Quintella Reichert, MD as PCP - Cardiology (Cardiology) Micki Riley, MD as Consulting Physician (Neurology) Mateo Flow, MD as Consulting Physician (Ophthalmology) Meryl Dare, MD as Consulting Physician (Gastroenterology) Jonelle Sidle, MD as Consulting Physician (Cardiology) Meribeth Mattes, PA-C as Physician Assistant (Endocrinology) Delton See, MD (Inactive) as Consulting Physician (Rehabilitation) Terrial Rhodes, MD as Consulting Physician (Nephrology) Donzetta Starch, MD as Consulting Physician (Dermatology)  Extended Emergency Contact Information Primary Emergency Contact: Lianne Moris States of [place] Home Phone: [phone] Relation: Daughter Secondary Emergency Contact: [facility] Phone: [phone] Relation: Son Interpreter needed? No  Code Status:   Goals of care: Advanced Directive information    06/02/2022    1:38 PM  Advanced Directives  Does Patient Have a Medical Advance Directive? Yes  Type of Estate agent of [place];Living will;Out of facility DNR (pink MOST or yellow form)  Does patient want to make changes to medical advance directive? No - Patient declined  Copy of Healthcare Power of Attorney in Chart? Yes - validated most recent copy scanned in chart (See row information)     Chief Complaint  Patient presents with   Establish Care    New Patient.    HPI:  Pt is a 89 y.o. female seen today establish care here at [facility] for medical management of chronic diseases. 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 States had shingles rash on left side.Rash has resolved but area still red.she was treated at [facility] on 04/29/2022.states had one shingles shot long time ago but has never received Shingrix.   Type 2 DM - Home Fasting CBG before breakfast and lunch readings runs in the 130's -190's with some readings in the 200's. Readings after dinner 300 and bedtime runs in the 190's - 200's. Follows up with Endocrinology.  States on NovoLog 10 units 3 times daily before meals but daughter states usually does not take 10 units every time. Does not do any exercise but usually walks in the house. States eat strawberry cake every now and then which makes the blood sugars high.  Requests new referral to another ophthalmologist state was recently diagnosed with left eye retinopathy but has been going to send ophthalmologist was never told she had retinopathy.  CKD stage 4 - Follows up with Nephrologist. Latest CR 1.75  and GFR 28   Generalized Anxiety - Takes clonazepam whenever she is upset twice.  GERD - states symptoms well controlled.no blood or dark stool  Hx of TIA - sates follows up Neurologist.No residual.   Past Medical History:  Diagnosis Date   Achilles tendon injury 11/27/2010   ALLERGIC RHINITIS 05/12/2006   Qualifier: Diagnosis of  By: Drue Novel MD, Nolon Rod.    Anxiety state 07/03/2009   Qualifier: Diagnosis of  By: Drue Novel MD, Jose E.    Back pain 07/01/2010   Chronic kidney disease (CKD), stage III (moderate) (HCC) 05/31/2014   Chronic right SI joint pain 09/22/2013   DEGENERATIVE JOINT DISEASE, CERVICAL SPINE 06/23/2006   Annotation: had a CAT scan with a cervical myelogram that show  left C6-7 and  C5-6 foraminal stenosis  Qualifier: Diagnosis of  By: Drue Novel MD, Nolon Rod.    DEPRESSION 05/12/2006   Qualifier: Diagnosis of  By: Drue Novel MD, Jose E.    DIABETES MELLITUS, TYPE II 05/12/2006   Now following w/ Endo at [facility]     DIABETIC  RETINOPATHY 04/27/2007    Qualifier: Diagnosis of  By: Janit Bern     Encephalopathy, hypertensive    Essential hypertension 05/12/2006   Qualifier: Diagnosis of  By: Drue Novel MD, Nolon Rod.    GAIT DISTURBANCE 08/07/2009   Qualifier: Diagnosis of  By: Drue Novel MD, Nolon Rod.    GERD (gastroesophageal reflux disease)    History of colonoscopy    History of CT scan    History of mammogram    History of MRI    Hyperlipidemia associated with type 2 diabetes mellitus (HCC) 05/12/2006   Qualifier: Diagnosis of  By: Drue Novel MD, Nolon Rod.    ILD (interstitial lung disease) (HCC) 05/31/2015   NECK PAIN, CHRONIC 04/03/2008   Qualifier: Diagnosis of  By: Drue Novel MD, Nolon Rod.    Osteoarthritis 02/10/2016   Osteopenia    Osteoporosis 06/23/2006   Annotation: had a bone density test in 08-2004.  T score was -2.4 Qualifier: Diagnosis of  By: Drue Novel MD, Nolon Rod     [facility] (subarachnoid hemorrhage) (HCC)    Scleroderma (HCC) 02/28/2016   Stroke (HCC)    Takotsubo cardiomyopathy    s/p stress MI with stress induced CM with normal coronary arteries by cath 2007 with normalization of LVF by echo 04/2005   TIA (transient ischemic attack)    Vasculitis of skin 09/28/2012   Vitamin D deficiency 10/01/2014   Past Surgical History:  Procedure Laterality Date   ABDOMINAL HYSTERECTOMY  1977   no oophorectomy   APPENDECTOMY     CARDIAC CATHETERIZATION     CATARACT EXTRACTION, BILATERAL  2011   SPINAL FUSION  2000   Dr. Venetia Maxon   TONSILLECTOMY      Allergies  Allergen Reactions   Cephalexin Nausea And Vomiting    Pt stated made severely sick, will never take again   Nintedanib Diarrhea   Pioglitazone Other (See Comments)    REACTION: EDEMA   Sertraline Nausea And Vomiting    Allergies as of 06/02/2022       Reactions   Cephalexin Nausea And Vomiting   Pt stated made severely sick, will never take again   Nintedanib Diarrhea   Pioglitazone Other (See Comments)   REACTION: EDEMA   Sertraline Nausea And Vomiting        Medication List         Accurate as of Jun 02, 2022  1:59 PM. If you have any questions, ask your nurse or doctor.          STOP taking these medications    doxycycline 100 MG capsule Commonly known as: VIBRAMYCIN Stopped by: Donalee Citrin Betzaira Mentel, NP   ondansetron 4 MG disintegrating tablet Commonly known as: ZOFRAN-ODT Stopped by: [name], NP       TAKE these medications    acetaminophen 500 MG tablet Commonly known as: TYLENOL Take 500 mg by mouth every 6 (six) hours as needed for mild pain or headache.   amLODipine 5 MG tablet Commonly known as: NORVASC Take 5 mg by mouth daily.   aspirin EC 81 MG tablet Take 81 mg by mouth daily. Swallow whole.   atorvastatin 80 MG tablet Commonly known as: LIPITOR Take 80 mg by mouth daily.  clonazePAM 0.5 MG tablet Commonly known as: KLONOPIN Take 0.5 mg by mouth 2 (two) times daily as needed for anxiety.   clopidogrel 75 MG tablet Commonly known as: PLAVIX Take 75 mg by mouth daily.   famotidine 40 MG tablet Commonly known as: PEPCID Take 40 mg by mouth daily. What changed: Another medication with the same name was removed. Continue taking this medication, and follow the directions you see here. Changed by: [name], NP   glimepiride 2 MG tablet Commonly known as: AMARYL Take 2 mg by mouth in the morning and at bedtime.   lisinopril 5 MG tablet Commonly known as: ZESTRIL Take 5 mg by mouth daily.   metoprolol succinate 50 MG 24 hr tablet Commonly known as: TOPROL-XL Take 1 tablet (50 mg total) by mouth daily. Take with or immediately following a meal.   NovoLOG FlexPen 100 UNIT/ML FlexPen Generic drug: insulin aspart Inject 10 Units into the skin 3 (three) times daily before meals.        Review of Systems  Constitutional:  Positive for fatigue and unexpected weight change. Negative for appetite change, chills and fever.       Reports weight loss  HENT:  Positive for hearing loss. Negative for congestion,  dental problem, ear discharge, ear pain, facial swelling, nosebleeds, postnasal drip, rhinorrhea, sinus pressure, sinus pain, sneezing, sore throat, tinnitus and trouble swallowing.   Eyes:  Negative for pain, discharge, redness, itching and visual disturbance.  Respiratory:  Negative for cough, chest tightness, shortness of breath and wheezing.   Cardiovascular:  Negative for chest pain, palpitations and leg swelling.  Gastrointestinal:  Negative for abdominal distention, abdominal pain, blood in stool, constipation, diarrhea, nausea and vomiting.  Endocrine: Positive for heat intolerance. Negative for cold intolerance, polydipsia, polyphagia and polyuria.  Genitourinary:  Negative for difficulty urinating, dysuria, flank pain, frequency and urgency.       Incontinent with a weak stream .  Nephrology for chronic kidney disease stage IV  Musculoskeletal:  Negative for arthralgias, back pain, gait problem, joint swelling, myalgias, neck pain and neck stiffness.  Skin:  Negative for color change, pallor, rash and wound.  Neurological:  Negative for dizziness, syncope, speech difficulty, weakness, light-headedness, numbness and headaches.       History of TIA  Hematological:  Does not bruise/bleed easily.  Psychiatric/Behavioral:  Negative for agitation, behavioral problems, confusion, hallucinations, self-injury, sleep disturbance and suicidal ideas. The patient is nervous/anxious.        Reports depression, anxiety paranoia.  Has agitation was under stress and she takes Klonopin.  Requests refills Memory loss reported on admission    Immunization History  Administered Date(s) Administered   Fluad Quad(high Dose 65+) 10/20/2018   H1N1 01/05/2008   Influenza Split 10/22/2009, 12/09/2010, 10/24/2013, 10/01/2014, 09/14/2016   Influenza Whole 12/27/2006, 10/18/2007, 01/05/2008, 12/19/2008, 10/22/2009   Influenza, High Dose Seasonal PF 12/16/2015, 10/27/2017, 10/20/2018   Influenza,inj,Quad PF,6+  Mos 10/24/2013, 10/01/2014, 09/14/2016   Influenza,inj,quad, With Preservative 11/23/2016   Influenza-Unspecified 12/27/2006, 01/05/2008, 12/19/2008, 12/09/2010, 10/19/2012, 10/20/2018, 10/03/2019, 10/08/2020, 09/30/2021   Novel Infuenza-h1n1-09 01/05/2008   PFIZER(Purple Top)SARS-COV-2 Vaccination 05/01/2019, 05/22/2019, 11/28/2019   Pneumococcal Conjugate-13 11/02/2013   Pneumococcal Polysaccharide-23 11/19/2009   Td 06/18/2008   Td (Adult),5 Lf Tetanus Toxid, Preservative Free 06/18/2008   Zoster, Live 05/03/2007, 11/03/2007   Pertinent  Health Maintenance Due  Topic Date Due   MAMMOGRAM  09/09/2014   OPHTHALMOLOGY EXAM  07/29/2017   FOOT EXAM  08/12/2017   HEMOGLOBIN A1C  09/15/2021   INFLUENZA VACCINE  08/20/2022   DEXA SCAN  Completed      01/05/2022    6:00 PM 01/05/2022    8:27 PM 01/06/2022   11:00 AM 01/11/2022    9:06 PM 06/02/2022    1:38 PM  Fall Risk  Falls in the past year?     0  Was there an injury with Fall?     0  Fall Risk Category Calculator     0  (RETIRED) Patient Fall Risk Level Moderate fall risk Moderate fall risk Moderate fall risk Moderate fall risk   Patient at Risk for Falls Due to     No Fall Risks  Fall risk Follow up     Falls evaluation completed   Functional Status Survey:    Vitals:   06/02/22 1327  BP: (!) 140/74  Pulse: 61  Resp: 16  Temp: (!) 96.5 F (35.8 C)  SpO2: 98%  Weight: 120 lb 6.4 oz (54.6 kg)  Height: 5' (1.524 m)   Body mass index is 23.51 kg/m. Physical Exam Vitals reviewed.  Constitutional:      General: She is not in acute distress.    Appearance: Normal appearance. She is normal weight. She is not ill-appearing or diaphoretic.  HENT:     Head: Normocephalic.     Right Ear: Tympanic membrane, ear canal and external ear normal. There is no impacted cerumen.     Left Ear: Tympanic membrane, ear canal and external ear normal. There is no impacted cerumen.     Nose: Nose normal. No congestion or rhinorrhea.      Mouth/Throat:     Mouth: Mucous membranes are moist.     Pharynx: Oropharynx is clear. No oropharyngeal exudate or posterior oropharyngeal erythema.  Eyes:     General: No scleral icterus.       Right eye: No discharge.        Left eye: No discharge.     Extraocular Movements: Extraocular movements intact.     Conjunctiva/sclera: Conjunctivae normal.     Pupils: Pupils are equal, round, and reactive to light.  Neck:     Vascular: No carotid bruit.  Cardiovascular:     Rate and Rhythm: Normal rate and regular rhythm.     Pulses: Normal pulses.     Heart sounds: Normal heart sounds. No murmur heard.    No friction rub. No gallop.  Pulmonary:     Effort: Pulmonary effort is normal. No respiratory distress.     Breath sounds: Normal breath sounds. No wheezing, rhonchi or rales.  Chest:     Chest wall: No tenderness.  Abdominal:     General: Bowel sounds are normal. There is no distension.     Palpations: Abdomen is soft. There is no mass.     Tenderness: There is no abdominal tenderness. There is no right CVA tenderness, left CVA tenderness, guarding or rebound.  Musculoskeletal:        General: No swelling or tenderness. Normal range of motion.     Cervical back: Normal range of motion. No rigidity or tenderness.     Right lower leg: No edema.     Left lower leg: No edema.  Lymphadenopathy:     Cervical: No cervical adenopathy.  Skin:    General: Skin is warm and dry.     Coloration: Skin is not pale.     Findings: No bruising, erythema, lesion or rash.     Comments: Left side  shingles area redness still persist  Neurological:     Mental Status: She is alert and oriented to person, place, and time.     Cranial Nerves: No cranial nerve deficit.     Sensory: No sensory deficit.     Motor: No weakness.     Coordination: Coordination normal.     Gait: Gait normal.  Psychiatric:        Mood and Affect: Mood is anxious.        Speech: Speech normal.        Behavior: Behavior  normal.        Thought Content: Thought content normal.        Cognition and Memory: She exhibits impaired recent memory.        Judgment: Judgment normal.     Labs reviewed: Recent Labs    01/04/22 1317 01/05/22 0735 01/11/22 2048 01/11/22 2052  NA 139 141 141 141  K 4.7 4.3 5.0 5.0  CL 111 113* 111 111  CO2 21* 22 22  --   GLUCOSE 146* 140* 183* 183*  BUN 44* 37* 61* 58*  CREATININE 1.97* 1.83* 2.38* 2.50*  CALCIUM 9.5 9.4 9.0  --    Recent Labs    01/04/22 1317 01/11/22 2048  AST 20 30  ALT 15 23  ALKPHOS 75 79  BILITOT 0.6 0.7  PROT 7.0 7.0  ALBUMIN 3.8 3.8   Recent Labs    01/04/22 1317 01/11/22 2048 01/11/22 2052  WBC 8.1 8.0  --   NEUTROABS 4.9 4.4  --   HGB 11.5* 10.9* 10.9*  HCT 34.9* 33.8* 32.0*  MCV 97.8 100.3*  --   PLT 240 252  --    Lab Results  Component Value Date   TSH 3.49 10/13/2016   Lab Results  Component Value Date   HGBA1C 10.0 (H) 03/18/2021   Lab Results  Component Value Date   CHOL 142 01/05/2022   HDL 34 (L) 01/05/2022   LDLCALC 59 01/05/2022   LDLDIRECT 110.0 10/13/2016   TRIG 244 (H) 01/05/2022   CHOLHDL 4.2 01/05/2022    Significant Diagnostic Results in last 30 days:  No results found.  Assessment/Plan 1. CKD stage 4 due to type 2 diabetes mellitus (HCC) Stage 4  -Continue to follow-up with the nephrology - Will continue to avoid Nephrotoxins and dose all other medication for renal clearance    2. Benign hypertension with chronic kidney disease, stage IV (HCC) Blood pressure stable -Continue on metoprolol, lisinopril and amlodipine -Continue on aspirin and atorvastatin for cardiovascular event prevention - TSH; Future - COMPLETE METABOLIC PANEL WITH GFR; Future - CBC with Differential/Platelet; Future  3. Generalized anxiety disorder Continue home clonazepam PDMP reviewed Will obtain urine drug test -Clonazepam use contract signed during visit - DRUG MONITOR, PANEL 1, W/CONF, URINE  4. Encounter to  establish care Immunization up-to-date except due for shingles and COVID-vaccine though will postpone Shingrix vaccine due to presenting shingles infection in April, 2024.  Scheduled for fasting labs.  5. Gastroesophageal reflux disease without esophagitis Symptoms well-controlled -Continue on famotidine  6. Chronic bilateral low back pain without sciatica Chronic  7. Hx of transient ischemic attack (TIA) Chronic No residual anemia -Continue on atorvastatin, Plavix and aspirin -Continue to follow-up with neurologist  8. Hyperlipidemia associated with type 2 diabetes mellitus (HCC) Previous LDL at goal but TRG high  - dietary modification and exercise as tolerated.relaxant to exercise but walks within the house.  - Lipid panel; Future  9.  Diabetes mellitus due to underlying condition with stage 4 chronic kidney disease, with long-term current use of insulin (HCC) Lab Results  Component Value Date   HGBA1C 10.0 (H) 03/18/2021  - CBG reports some readings in the 200's  -Continue on NovoLog 10 units 3 times before meals.  Encouraged to use insulin as directed. -Continue on glimepiride -On ACE inhibitor for renal protection -Continue on aspirin and statin -Request referral for ophthalmologist due to recent diagnosis of diabetic retinopathy -Monofilament sensation intact - Ambulatory referral to Ophthalmology - Hemoglobin A1c; Future     Family/ staff Communication: Reviewed plan of care with patient and daughter verbalized understanding  Labs/tests ordered:  - CBC with Differential/Platelet - CMP with eGFR([facility]) - TSH - Hgb A1C - Lipid panel  Next Appointment : Return in about 6 months (around 12/03/2022), or if symptoms worsen or fail to improve, for medical mangement of chronic issues., fasting labs in one week or soon .   [name], NP

## 2022-06-02 NOTE — Progress Notes (Signed)
Opened in error

## 2022-06-04 ENCOUNTER — Other Ambulatory Visit: Payer: Medicare HMO

## 2022-06-04 DIAGNOSIS — E785 Hyperlipidemia, unspecified: Secondary | ICD-10-CM

## 2022-06-04 DIAGNOSIS — I129 Hypertensive chronic kidney disease with stage 1 through stage 4 chronic kidney disease, or unspecified chronic kidney disease: Secondary | ICD-10-CM

## 2022-06-04 DIAGNOSIS — Z794 Long term (current) use of insulin: Secondary | ICD-10-CM

## 2022-06-04 LAB — DRUG MONITOR, PANEL 1, W/CONF, URINE
Alphahydroxyalprazolam: NEGATIVE ng/mL (ref <25)
Alphahydroxymidazolam: NEGATIVE ng/mL (ref <50)
Alphahydroxytriazolam: NEGATIVE ng/mL (ref <50)
Aminoclonazepam: 231 ng/mL — ABNORMAL HIGH (ref <25)
Amphetamines: NEGATIVE ng/mL (ref <500)
Barbiturates: NEGATIVE ng/mL (ref <300)
Benzodiazepines: POSITIVE ng/mL — AB (ref <100)
Cocaine Metabolite: NEGATIVE ng/mL (ref <150)
Creatinine: 125 mg/dL (ref >=20.0)
Hydroxyethylflurazepam: NEGATIVE ng/mL (ref <50)
Lorazepam: NEGATIVE ng/mL (ref <50)
Marijuana Metabolite: NEGATIVE ng/mL (ref <20)
Methadone Metabolite: NEGATIVE ng/mL (ref <100)
Nordiazepam: NEGATIVE ng/mL (ref <50)
Opiates: NEGATIVE ng/mL (ref <100)
Oxazepam: NEGATIVE ng/mL (ref <50)
Oxidant: NEGATIVE ug/mL (ref <200)
Oxycodone: NEGATIVE ng/mL (ref <100)
Phencyclidine: NEGATIVE ng/mL (ref <25)
Temazepam: NEGATIVE ng/mL (ref <50)
pH: 5.5 (ref 4.5–9.0)

## 2022-06-04 LAB — DM TEMPLATE

## 2022-06-08 ENCOUNTER — Other Ambulatory Visit: Payer: Medicare HMO

## 2022-06-08 DIAGNOSIS — E0822 Diabetes mellitus due to underlying condition with diabetic chronic kidney disease: Secondary | ICD-10-CM | POA: Diagnosis not present

## 2022-06-08 DIAGNOSIS — E785 Hyperlipidemia, unspecified: Secondary | ICD-10-CM | POA: Diagnosis not present

## 2022-06-08 DIAGNOSIS — Z794 Long term (current) use of insulin: Secondary | ICD-10-CM | POA: Diagnosis not present

## 2022-06-08 DIAGNOSIS — N184 Chronic kidney disease, stage 4 (severe): Secondary | ICD-10-CM | POA: Diagnosis not present

## 2022-06-08 DIAGNOSIS — E1169 Type 2 diabetes mellitus with other specified complication: Secondary | ICD-10-CM | POA: Diagnosis not present

## 2022-06-08 DIAGNOSIS — I129 Hypertensive chronic kidney disease with stage 1 through stage 4 chronic kidney disease, or unspecified chronic kidney disease: Secondary | ICD-10-CM | POA: Diagnosis not present

## 2022-06-08 LAB — CBC WITH DIFFERENTIAL/PLATELET
Absolute Monocytes: 680 cells/uL (ref 200–950)
Basophils Relative: 0.6 %
Eosinophils Absolute: 231 cells/uL (ref 15–500)
HCT: 29.9 % — ABNORMAL LOW (ref 35.0–45.0)
Lymphs Abs: 1815 cells/uL (ref 850–3900)
MCV: 97.1 fL (ref 80.0–100.0)
Monocytes Relative: 10.3 %
WBC: 6.6 10*3/uL (ref 3.8–10.8)

## 2022-06-08 LAB — HEMOGLOBIN A1C
Hgb A1c MFr Bld: 8.6 % of total Hgb — ABNORMAL HIGH (ref <5.7)
eAG (mmol/L): 11.1 mmol/L

## 2022-06-09 LAB — CBC WITH DIFFERENTIAL/PLATELET
Basophils Absolute: 40 cells/uL (ref 0–200)
Eosinophils Relative: 3.5 %
Hemoglobin: 9.6 g/dL — ABNORMAL LOW (ref 11.7–15.5)
MCH: 31.2 pg (ref 27.0–33.0)
MCHC: 32.1 g/dL (ref 32.0–36.0)
MPV: 10.5 fL (ref 7.5–12.5)
Neutro Abs: 3835 cells/uL (ref 1500–7800)
Neutrophils Relative %: 58.1 %
Platelets: 254 10*3/uL (ref 140–400)
RBC: 3.08 10*6/uL — ABNORMAL LOW (ref 3.80–5.10)
RDW: 11.8 % (ref 11.0–15.0)
Total Lymphocyte: 27.5 %

## 2022-06-09 LAB — COMPLETE METABOLIC PANEL WITH GFR
AG Ratio: 1.6 (calc) (ref 1.0–2.5)
ALT: 11 U/L (ref 6–29)
AST: 13 U/L (ref 10–35)
Albumin: 3.8 g/dL (ref 3.6–5.1)
Alkaline phosphatase (APISO): 85 U/L (ref 37–153)
BUN/Creatinine Ratio: 22 (calc) (ref 6–22)
BUN: 40 mg/dL — ABNORMAL HIGH (ref 7–25)
CO2: 23 mmol/L (ref 20–32)
Calcium: 9 mg/dL (ref 8.6–10.4)
Chloride: 112 mmol/L — ABNORMAL HIGH (ref 98–110)
Creat: 1.83 mg/dL — ABNORMAL HIGH (ref 0.60–0.95)
Globulin: 2.4 g/dL (calc) (ref 1.9–3.7)
Glucose, Bld: 128 mg/dL — ABNORMAL HIGH (ref 65–99)
Potassium: 4.6 mmol/L (ref 3.5–5.3)
Sodium: 143 mmol/L (ref 135–146)
Total Bilirubin: 0.4 mg/dL (ref 0.2–1.2)
Total Protein: 6.2 g/dL (ref 6.1–8.1)
eGFR: 27 mL/min/{1.73_m2} — ABNORMAL LOW (ref >=60)

## 2022-06-09 LAB — LIPID PANEL
Cholesterol: 153 mg/dL (ref <200)
HDL: 40 mg/dL — ABNORMAL LOW (ref >=50)
LDL Cholesterol (Calc): 80 mg/dL (calc)
Non-HDL Cholesterol (Calc): 113 mg/dL (calc) (ref <130)
Total CHOL/HDL Ratio: 3.8 (calc) (ref <5.0)
Triglycerides: 240 mg/dL — ABNORMAL HIGH (ref <150)

## 2022-06-09 LAB — TSH: TSH: 3.5 mIU/L (ref 0.40–4.50)

## 2022-06-09 LAB — HEMOGLOBIN A1C: Mean Plasma Glucose: 200 mg/dL

## 2022-06-11 ENCOUNTER — Other Ambulatory Visit: Payer: Self-pay

## 2022-07-06 ENCOUNTER — Other Ambulatory Visit: Payer: Self-pay | Admitting: Family

## 2022-07-06 NOTE — Telephone Encounter (Signed)
Clonazepam refilled as requested 

## 2022-07-06 NOTE — Telephone Encounter (Signed)
Patient is requesting a refill of the following medications: Requested Prescriptions   Pending Prescriptions Disp Refills   clonazePAM (KLONOPIN) 0.5 MG tablet [Pharmacy Med Name: CLONAZEPAM 0.5 MG Tablet] 45 tablet     Sig: TAKE 1 TABLET TWICE DAILY AS NEEDED FOR ANXIETY    Date of last refill:06/02/2022  Refill amount: 0  Treatment agreement date: 06/02/2022

## 2022-07-07 ENCOUNTER — Other Ambulatory Visit: Payer: Self-pay

## 2022-07-07 MED ORDER — FAMOTIDINE 40 MG PO TABS: 40.0000 mg | ORAL_TABLET | Freq: Every day | ORAL | 1 refills | Status: DC

## 2022-07-07 MED ORDER — ATORVASTATIN CALCIUM 80 MG PO TABS: 80.0000 mg | ORAL_TABLET | Freq: Every day | ORAL | 1 refills | Status: DC

## 2022-07-07 NOTE — Telephone Encounter (Signed)
High risk or very high risk warning populated when attempting to refill FAMOTIDINE. RX request sent to PCP for review and approval if warranted.

## 2022-07-15 ENCOUNTER — Emergency Department (HOSPITAL_COMMUNITY)
Admission: EM | Admit: 2022-07-15 | Discharge: 2022-07-16 | Disposition: A | Discharge status: Home / Self Care | Payer: Medicare HMO | Attending: Emergency Medicine | Admitting: Emergency Medicine

## 2022-07-15 DIAGNOSIS — Z79899 Other long term (current) drug therapy: Secondary | ICD-10-CM | POA: Insufficient documentation

## 2022-07-15 DIAGNOSIS — M79602 Pain in left arm: Secondary | ICD-10-CM | POA: Diagnosis not present

## 2022-07-15 DIAGNOSIS — R61 Generalized hyperhidrosis: Secondary | ICD-10-CM | POA: Insufficient documentation

## 2022-07-15 DIAGNOSIS — E1122 Type 2 diabetes mellitus with diabetic chronic kidney disease: Secondary | ICD-10-CM | POA: Diagnosis not present

## 2022-07-15 DIAGNOSIS — Z951 Presence of aortocoronary bypass graft: Secondary | ICD-10-CM | POA: Diagnosis not present

## 2022-07-15 DIAGNOSIS — N183 Chronic kidney disease, stage 3 unspecified: Secondary | ICD-10-CM | POA: Insufficient documentation

## 2022-07-15 DIAGNOSIS — I129 Hypertensive chronic kidney disease with stage 1 through stage 4 chronic kidney disease, or unspecified chronic kidney disease: Secondary | ICD-10-CM | POA: Diagnosis not present

## 2022-07-15 DIAGNOSIS — R079 Chest pain, unspecified: Secondary | ICD-10-CM | POA: Diagnosis not present

## 2022-07-15 DIAGNOSIS — I2510 Atherosclerotic heart disease of native coronary artery without angina pectoris: Secondary | ICD-10-CM | POA: Insufficient documentation

## 2022-07-15 DIAGNOSIS — Z7722 Contact with and (suspected) exposure to environmental tobacco smoke (acute) (chronic): Secondary | ICD-10-CM | POA: Insufficient documentation

## 2022-07-15 DIAGNOSIS — M79603 Pain in arm, unspecified: Secondary | ICD-10-CM | POA: Diagnosis not present

## 2022-07-15 DIAGNOSIS — I10 Essential (primary) hypertension: Secondary | ICD-10-CM | POA: Diagnosis not present

## 2022-07-15 DIAGNOSIS — R9389 Abnormal findings on diagnostic imaging of other specified body structures: Secondary | ICD-10-CM | POA: Diagnosis not present

## 2022-07-15 DIAGNOSIS — Z8673 Personal history of transient ischemic attack (TIA), and cerebral infarction without residual deficits: Secondary | ICD-10-CM | POA: Insufficient documentation

## 2022-07-15 DIAGNOSIS — Z7982 Long term (current) use of aspirin: Secondary | ICD-10-CM | POA: Insufficient documentation

## 2022-07-16 ENCOUNTER — Encounter (HOSPITAL_COMMUNITY): Payer: Self-pay

## 2022-07-16 ENCOUNTER — Emergency Department (HOSPITAL_COMMUNITY): Payer: Medicare HMO

## 2022-07-16 ENCOUNTER — Other Ambulatory Visit: Payer: Self-pay

## 2022-07-16 DIAGNOSIS — R9389 Abnormal findings on diagnostic imaging of other specified body structures: Secondary | ICD-10-CM | POA: Diagnosis not present

## 2022-07-16 DIAGNOSIS — M79602 Pain in left arm: Secondary | ICD-10-CM | POA: Diagnosis not present

## 2022-07-16 DIAGNOSIS — R079 Chest pain, unspecified: Secondary | ICD-10-CM | POA: Diagnosis not present

## 2022-07-16 LAB — TROPONIN I (HIGH SENSITIVITY)
Troponin I (High Sensitivity): 17 ng/L (ref <18)
Troponin I (High Sensitivity): 21 ng/L — ABNORMAL HIGH (ref <18)
Troponin I (High Sensitivity): 20 ng/L — ABNORMAL HIGH (ref <18)

## 2022-07-16 LAB — CBC
HCT: 33.1 % — ABNORMAL LOW (ref 36.0–46.0)
Hemoglobin: 10.7 g/dL — ABNORMAL LOW (ref 12.0–15.0)
MCH: 32.7 pg (ref 26.0–34.0)
MCHC: 32.3 g/dL (ref 30.0–36.0)
MCV: 101.2 fL — ABNORMAL HIGH (ref 80.0–100.0)
Platelets: 237 10*3/uL (ref 150–400)
RBC: 3.27 MIL/uL — ABNORMAL LOW (ref 3.87–5.11)
RDW: 11.9 % (ref 11.5–15.5)
WBC: 8.5 10*3/uL (ref 4.0–10.5)
nRBC: 0 % (ref 0.0–0.2)

## 2022-07-16 LAB — BASIC METABOLIC PANEL
Anion gap: 15 (ref 5–15)
BUN: 40 mg/dL — ABNORMAL HIGH (ref 8–23)
CO2: 17 mmol/L — ABNORMAL LOW (ref 22–32)
Calcium: 8.4 mg/dL — ABNORMAL LOW (ref 8.9–10.3)
Chloride: 108 mmol/L (ref 98–111)
Creatinine, Ser: 1.64 mg/dL — ABNORMAL HIGH (ref 0.44–1.00)
GFR, Estimated: 30 mL/min — ABNORMAL LOW (ref >60)
Glucose, Bld: 159 mg/dL — ABNORMAL HIGH (ref 70–99)
Potassium: 4.1 mmol/L (ref 3.5–5.1)
Sodium: 140 mmol/L (ref 135–145)

## 2022-07-16 LAB — CBG MONITORING, ED: Glucose-Capillary: 148 mg/dL — ABNORMAL HIGH (ref 70–99)

## 2022-07-16 MED ORDER — HYDRALAZINE HCL 25 MG PO TABS
25.0000 mg | ORAL_TABLET | Freq: Once | ORAL | Status: AC
  Administered 2022-07-16: 25 mg via ORAL
  Filled 2022-07-16: qty 1

## 2022-07-16 MED ORDER — CLONAZEPAM 0.5 MG PO TABS
0.5000 mg | ORAL_TABLET | Freq: Once | ORAL | Status: AC
  Administered 2022-07-16: 0.5 mg via ORAL
  Filled 2022-07-16: qty 1

## 2022-07-16 NOTE — ED Triage Notes (Signed)
Pt BIB [facility] from home. Pt was sitting and watching tv when she started having L arm pain. It lasted 30 min. Pt denies CP or SOB.   EMS VS HR 70 BP 188/106 RR 20 O2 98% RA CBG 141

## 2022-07-16 NOTE — ED Provider Notes (Signed)
[facility]PT [facility] Emergency Department Provider Note MRN:  [id number]  Arrival date & time: 07/16/22     Chief Complaint   Arm pain History of Present Illness   Sandra [name] is a 89 y.o. year-old female with a history of CKD, diabetes presenting to the ED with chief complaint of arm pain.  Left arm pain starting suddenly around 9:30 PM associated with diaphoresis.  Concerned about possibly her heart.  Took aspirin on the way with EMS.  Pain is now resolved.  Review of Systems  A thorough review of systems was obtained and all systems are negative except as noted in the HPI and PMH.   Patient's Health History    Past Medical History:  Diagnosis Date   Achilles tendon injury 11/27/2010   ALLERGIC RHINITIS 05/12/2006   Qualifier: Diagnosis of  By: Drue Novel MD, Nolon Rod.    Anxiety state 07/03/2009   Qualifier: Diagnosis of  By: Drue Novel MD, Jose E.    Back pain 07/01/2010   Chronic kidney disease (CKD), stage III (moderate) (HCC) 05/31/2014   Chronic right SI joint pain 09/22/2013   DEGENERATIVE JOINT DISEASE, CERVICAL SPINE 06/23/2006   Annotation: had a CAT scan with a cervical myelogram that show  left C6-7 and  C5-6 foraminal stenosis Qualifier: Diagnosis of  By: Drue Novel MD, Nolon Rod.    DEPRESSION 05/12/2006   Qualifier: Diagnosis of  By: Drue Novel MD, Jose E.    DIABETES MELLITUS, TYPE II 05/12/2006   Now following w/ Endo at [facility]     DIABETIC  RETINOPATHY 04/27/2007   Qualifier: Diagnosis of  By: Janit Bern     Encephalopathy, hypertensive    Essential hypertension 05/12/2006   Qualifier: Diagnosis of  By: Drue Novel MD, Nolon Rod.    GAIT DISTURBANCE 08/07/2009   Qualifier: Diagnosis of  By: Drue Novel MD, Nolon Rod.    GERD (gastroesophageal reflux disease)    History of colonoscopy    History of CT scan    History of mammogram    History of MRI    Hyperlipidemia associated with type 2 diabetes mellitus (HCC) 05/12/2006   Qualifier: Diagnosis of  By: Drue Novel MD, Nolon Rod.    ILD  (interstitial lung disease) (HCC) 05/31/2015   NECK PAIN, CHRONIC 04/03/2008   Qualifier: Diagnosis of  By: Drue Novel MD, Nolon Rod.    Osteoarthritis 02/10/2016   Osteopenia    Osteoporosis 06/23/2006   Annotation: had a bone density test in 08-2004.  T score was -2.4 Qualifier: Diagnosis of  By: Drue Novel MD, Nolon Rod     [facility] (subarachnoid hemorrhage) (HCC)    Scleroderma (HCC) 02/28/2016   Stroke [facility])    Takotsubo cardiomyopathy    s/p stress MI with stress induced CM with normal coronary arteries by cath 2007 with normalization of LVF by echo 04/2005   TIA (transient ischemic attack)    Vasculitis of skin 09/28/2012   Vitamin D deficiency 10/01/2014    Past Surgical History:  Procedure Laterality Date   ABDOMINAL HYSTERECTOMY  1977   no oophorectomy   APPENDECTOMY     CARDIAC CATHETERIZATION     CATARACT EXTRACTION, BILATERAL  2011   SPINAL FUSION  2000   Dr. Venetia Maxon   TONSILLECTOMY      Family History  Problem Relation Age of Onset   Stroke Mother    Cancer Sister    Lung disease Sister    Intracerebral hemorrhage Daughter        assoc with post-partum  Hypertension Daughter    Breast cancer Other        ?Aunt   Cancer Other        breast?   Coronary artery disease Neg Hx     Social History   Socioeconomic History   Marital status: Divorced    Spouse name: Not on file   Number of children: 2   Years of education: Not on file   Highest education level: Not on file  Occupational History   Occupation: Retired    Comment: Accounting  Tobacco Use   Smoking status: Never    Passive exposure: Yes   Smokeless tobacco: Never   Tobacco comments:    Mother & Children  Vaping Use   Vaping Use: Never used  Substance and Sexual Activity   Alcohol use: Yes    Alcohol/week: 1.0 standard drink of alcohol    Types: 1 Glasses of wine per week    Comment: rarely   Drug use: No   Sexual activity: Never  Other Topics Concern   Not on file  Social History Narrative   10/21/20 Lives  with her daughter and G-daughter   Diet- working on portion control   Exercise- no routine exercise but active      [place] [facility]:   Originally from [place]. Always lived in [place]. Previously has traveled to [place], [place], [place], [place], & [address]. Previously did accounting and clerical work. Has a dog currently. Remote exposure to a parakeet. No mold exposure.       Tobacco use, amount per day now: None   Past tobacco use, amount per day: None   How many years did you use tobacco: None   Alcohol use (drinks per week): None   Diet: Diabetic    Do you drink/eat things with caffeine:   Marital status:                                  What year were you married?   Do you live in a house, apartment, assisted living, condo, trailer, etc.? House   Is it one or more stories? One   How many persons live in your home? 3   Do you have pets in your home?( please list) No   Highest Level of education completed? High School   Current or past profession:Accounting    Do you exercise?    No                              Type and how often? N/A   Do you have a living will? Yes   Do you have a DNR form?                                   If not, do you want to discuss one?   Do you have signed POA/HPOA forms?    Yes                    If so, please bring to you appointment      Do you have any difficulty bathing or dressing yourself? Yes   Do you have any difficulty preparing food or eating?  Yes   Do you have any difficulty managing your medications? Yes   Do you have any  difficulty managing your finances? Yes   Do you have any difficulty affording your medications?  Sometimes   Social Determinants of Health   Financial Resource Strain: Not on file  Food Insecurity: No Food Insecurity (01/05/2022)   Hunger Vital Sign    Worried About Running Out of Food in the Last Year: Never true    Ran Out of Food in the Last Year: Never true  Transportation Needs: No Transportation Needs (01/05/2022)   PRAPARE -  Administrator, Civil Service (Medical): No    Lack of Transportation (Non-Medical): No  Physical Activity: Not on file  Stress: Not on file  Social Connections: Not on file  Intimate Partner Violence: Not At Risk (01/05/2022)   Humiliation, Afraid, Rape, and Kick questionnaire    Fear of Current or Ex-Partner: No    Emotionally Abused: No    Physically Abused: No    Sexually Abused: No     Physical Exam   Vitals:   07/16/22 0530 07/16/22 0600  BP: (!) 212/77 (!) 202/66  Pulse: 81 73  Resp: (!) 22 19  Temp:    SpO2: 99% 98%    CONSTITUTIONAL:  well-appearing, NAD NEURO/PSYCH:  Alert and oriented x 3, no focal deficits EYES:  eyes equal and reactive ENT/NECK:  no LAD, no JVD CARDIO:  regular rate, well-perfused, normal S1 and S2 PULM:  CTAB no wheezing or rhonchi GI/GU:  non-distended, non-tender MSK/SPINE:  No gross deformities, no edema SKIN:  no rash, atraumatic   *Additional and/or pertinent findings included in MDM below  Diagnostic and Interventional Summary    EKG Interpretation  Date/Time:  Thursday July 16 2022 00:06:35 EDT Ventricular Rate:  68 PR Interval:  150 QRS Duration: 100 QT Interval:  395 QTC Calculation: 421 R Axis:   33 Text Interpretation: Sinus rhythm Atrial premature complex Confirmed by Kennis Carina [id number]) on 07/16/2022 12:56:44 AM       Labs Reviewed  CBC - Abnormal; Notable for the following components:      Result Value   RBC 3.27 (*)    Hemoglobin 10.7 (*)    HCT 33.1 (*)    MCV 101.2 (*)    All other components within normal limits  BASIC METABOLIC PANEL - Abnormal; Notable for the following components:   CO2 17 (*)    Glucose, Bld 159 (*)    BUN 40 (*)    Creatinine, Ser 1.64 (*)    Calcium 8.4 (*)    GFR, Estimated 30 (*)    All other components within normal limits  CBG MONITORING, ED - Abnormal; Notable for the following components:   Glucose-Capillary 148 (*)    All other components within normal  limits  TROPONIN I (HIGH SENSITIVITY) - Abnormal; Notable for the following components:   Troponin I (High Sensitivity) 21 (*)    All other components within normal limits  TROPONIN I (HIGH SENSITIVITY) - Abnormal; Notable for the following components:   Troponin I (High Sensitivity) 20 (*)    All other components within normal limits  TROPONIN I (HIGH SENSITIVITY)    DG Chest Port 1 View  Final Result      Medications  hydrALAZINE (APRESOLINE) tablet 25 mg (25 mg Oral Given 07/16/22 0625)  clonazePAM (KLONOPIN) tablet 0.5 mg (0.5 mg Oral Given 07/16/22 6045)     Procedures  /  Critical Care Procedures  ED Course and Medical Decision Making  Initial Impression and Ddx Left arm pain without chest pain,  considering atypical presentation of ACS, referred pain.  Also considering MSK.  The limb is neurovascularly intact with no swelling or signs of infection.  Past medical/surgical history that increases complexity of ED encounter: Diabetes, CAD  Interpretation of Diagnostics I personally reviewed the EKG and my interpretation is as follows: Sinus rhythm without ischemic findings  Labs reassuring with no significant blood count or electrolyte disturbance.  Troponin negative, slightly positive on repeat, third troponin is downtrending.  Patient Reassessment and Ultimate Disposition/Management     Given the troponin trend and patient's atypical pain she is appropriate for discharge.  Hypertensive but improving on my last exam.  She is quite anxious and I suspect this is contributing to her high blood pressure.  No signs of endorgan dysfunction.  Patient management required discussion with the following services or consulting groups:  None  Complexity of Problems Addressed Acute illness or injury that poses threat of life of bodily function  Additional Data Reviewed and Analyzed Further history obtained from: Prior labs/imaging results  Additional Factors Impacting ED Encounter  Risk Consideration of hospitalization  Elmer Sow. Pilar Plate, MD [facility] Emergency Medicine [facility] [email or web address]  Final Clinical Impressions(s) / ED Diagnoses     ICD-10-CM   1. Pain of left upper extremity  M79.602       ED Discharge Orders     None        Discharge Instructions Discussed with and Provided to Patient:     Discharge Instructions      You were evaluated in the Emergency Department and after careful evaluation, we did not find any emergent condition requiring admission or further testing in the hospital.  Your exam/testing today was overall reassuring.  Labs do not show signs of heart attack or any other emergencies.  Recommend close follow-up with your regular doctor to discuss your symptoms and your blood pressure.  Please return to the Emergency Department if you experience any worsening of your condition.  Thank you for allowing [place] to be a part of your care.        Sabas Sous, MD 07/16/22 [phone]

## 2022-07-16 NOTE — Discharge Instructions (Signed)
You were evaluated in the Emergency Department and after careful evaluation, we did not find any emergent condition requiring admission or further testing in the hospital.  Your exam/testing today was overall reassuring.  Labs do not show signs of heart attack or any other emergencies.  Recommend close follow-up with your regular doctor to discuss your symptoms and your blood pressure.  Please return to the Emergency Department if you experience any worsening of your condition.  Thank you for allowing [place] to be a part of your care.

## 2022-08-21 ENCOUNTER — Ambulatory Visit (INDEPENDENT_AMBULATORY_CARE_PROVIDER_SITE_OTHER): Payer: Medicare HMO | Admitting: Nurse Practitioner

## 2022-08-21 ENCOUNTER — Encounter: Payer: Self-pay | Admitting: Nurse Practitioner

## 2022-08-21 VITALS — BP 142/68 | HR 67 | Temp 97.8°F | Resp 17 | Ht 61.0 in | Wt 122.2 lb

## 2022-08-21 DIAGNOSIS — K921 Melena: Secondary | ICD-10-CM | POA: Insufficient documentation

## 2022-08-21 DIAGNOSIS — I10 Essential (primary) hypertension: Secondary | ICD-10-CM

## 2022-08-21 DIAGNOSIS — R1110 Vomiting, unspecified: Secondary | ICD-10-CM

## 2022-08-21 DIAGNOSIS — R112 Nausea with vomiting, unspecified: Secondary | ICD-10-CM

## 2022-08-21 DIAGNOSIS — R609 Edema, unspecified: Secondary | ICD-10-CM | POA: Diagnosis not present

## 2022-08-21 DIAGNOSIS — R197 Diarrhea, unspecified: Secondary | ICD-10-CM | POA: Diagnosis not present

## 2022-08-21 DIAGNOSIS — I2510 Atherosclerotic heart disease of native coronary artery without angina pectoris: Secondary | ICD-10-CM | POA: Diagnosis not present

## 2022-08-21 LAB — COMPLETE METABOLIC PANEL WITH GFR
AG Ratio: 1.5 (calc) (ref 1.0–2.5)
ALT: 11 U/L (ref 6–29)
AST: 14 U/L (ref 10–35)
Albumin: 4 g/dL (ref 3.6–5.1)
Alkaline phosphatase (APISO): 88 U/L (ref 37–153)
BUN/Creatinine Ratio: 20 (calc) (ref 6–22)
BUN: 47 mg/dL — ABNORMAL HIGH (ref 7–25)
CO2: 22 mmol/L (ref 20–32)
Calcium: 8.8 mg/dL (ref 8.6–10.4)
Chloride: 109 mmol/L (ref 98–110)
Creat: 2.3 mg/dL — ABNORMAL HIGH (ref 0.60–0.95)
Globulin: 2.6 g/dL (calc) (ref 1.9–3.7)
Glucose, Bld: 180 mg/dL — ABNORMAL HIGH (ref 65–99)
Potassium: 5.2 mmol/L (ref 3.5–5.3)
Sodium: 137 mmol/L (ref 135–146)
Total Bilirubin: 0.4 mg/dL (ref 0.2–1.2)
Total Protein: 6.6 g/dL (ref 6.1–8.1)
eGFR: 20 mL/min/{1.73_m2} — ABNORMAL LOW (ref >=60)

## 2022-08-21 LAB — CBC WITH DIFFERENTIAL/PLATELET
Absolute Monocytes: 648 cells/uL (ref 200–950)
Basophils Absolute: 49 cells/uL (ref 0–200)
Basophils Relative: 0.6 %
Eosinophils Absolute: 271 cells/uL (ref 15–500)
Eosinophils Relative: 3.3 %
HCT: 29.6 % — ABNORMAL LOW (ref 35.0–45.0)
Hemoglobin: 9.8 g/dL — ABNORMAL LOW (ref 11.7–15.5)
Lymphs Abs: 1943 cells/uL (ref 850–3900)
MCH: 32.3 pg (ref 27.0–33.0)
MCHC: 33.1 g/dL (ref 32.0–36.0)
MCV: 97.7 fL (ref 80.0–100.0)
MPV: 10.4 fL (ref 7.5–12.5)
Monocytes Relative: 7.9 %
Neutro Abs: 5289 cells/uL (ref 1500–7800)
Neutrophils Relative %: 64.5 %
Platelets: 269 10*3/uL (ref 140–400)
RBC: 3.03 10*6/uL — ABNORMAL LOW (ref 3.80–5.10)
RDW: 11.3 % (ref 11.0–15.0)
Total Lymphocyte: 23.7 %
WBC: 8.2 10*3/uL (ref 3.8–10.8)

## 2022-08-21 LAB — POC COVID19 BINAXNOW: SARS Coronavirus 2 Ag: NEGATIVE

## 2022-08-21 MED ORDER — POTASSIUM CHLORIDE CRYS ER 20 MEQ PO TBCR: 20.0000 meq | EXTENDED_RELEASE_TABLET | ORAL | 3 refills | Status: DC | PRN

## 2022-08-21 MED ORDER — FUROSEMIDE 20 MG PO TABS: 20.0000 mg | ORAL_TABLET | Freq: Every day | ORAL | 3 refills | Status: DC | PRN

## 2022-08-21 MED ORDER — ONDANSETRON HCL 4 MG PO TABS: 4.0000 mg | ORAL_TABLET | Freq: Three times a day (TID) | ORAL | 0 refills | Status: DC | PRN

## 2022-08-21 NOTE — Assessment & Plan Note (Signed)
Hx of CAD, stroke, on ASA, Plavix, Statin

## 2022-08-21 NOTE — Progress Notes (Unsigned)
Location:   Clinic [facility]   Place of Service:    Provider: Chipper Oman NP  Code Status: DNR Goals of Care:     08/21/2022    3:19 PM  Advanced Directives  Does Patient Have a Medical Advance Directive? Yes  Type of Estate agent of [place];Living will;Out of facility DNR (pink MOST or yellow form)  Does patient want to make changes to medical advance directive? No - Patient declined     Chief Complaint  Patient presents with   Acute Visit    Patient states she has been having some diarrhea, feet swelling , vomiting.     HPI: [name] seen today for nausea, vomiting, diarrhea, black stools, left flank pain on and off about 2 weeks. Also noted swelling in ankles/foot, new, denied cough, SOB, chest pain/pressure, palpitation, she is afebrile, no O2 desaturation. COVID negative.     HTN, taking Amlodipine, Lisinopril, Metoprolol.   T2DM on insulin, Glimepiride, Hgb A1c 8.6 06/08/22  Anxiety, taking Clonazepam  GERD, taking Famotidine  Hx of CAD, stroke, on ASA, Plavix, Statin   CKD Bun/creat 40/1.64 07/16/22  Past Medical History:  Diagnosis Date   Achilles tendon injury 11/27/2010   ALLERGIC RHINITIS 05/12/2006   Qualifier: Diagnosis of  By: Drue Novel MD, Nolon Rod.    Anxiety state 07/03/2009   Qualifier: Diagnosis of  By: Drue Novel MD, Jose E.    Back pain 07/01/2010   Chronic kidney disease (CKD), stage III (moderate) (HCC) 05/31/2014   Chronic right SI joint pain 09/22/2013   DEGENERATIVE JOINT DISEASE, CERVICAL SPINE 06/23/2006   Annotation: had a CAT scan with a cervical myelogram that show  left C6-7 and  C5-6 foraminal stenosis Qualifier: Diagnosis of  By: Drue Novel MD, Nolon Rod.    DEPRESSION 05/12/2006   Qualifier: Diagnosis of  By: Drue Novel MD, Jose E.    DIABETES MELLITUS, TYPE II 05/12/2006   Now following w/ Endo at [facility]     DIABETIC  RETINOPATHY 04/27/2007   Qualifier: Diagnosis of  By: Janit Bern     Encephalopathy, hypertensive     Essential hypertension 05/12/2006   Qualifier: Diagnosis of  By: Drue Novel MD, Nolon Rod.    GAIT DISTURBANCE 08/07/2009   Qualifier: Diagnosis of  By: Drue Novel MD, Nolon Rod.    GERD (gastroesophageal reflux disease)    History of colonoscopy    History of CT scan    History of mammogram    History of MRI    Hyperlipidemia associated with type 2 diabetes mellitus (HCC) 05/12/2006   Qualifier: Diagnosis of  By: Drue Novel MD, Nolon Rod.    ILD (interstitial lung disease) (HCC) 05/31/2015   NECK PAIN, CHRONIC 04/03/2008   Qualifier: Diagnosis of  By: Drue Novel MD, Nolon Rod.    Osteoarthritis 02/10/2016   Osteopenia    Osteoporosis 06/23/2006   Annotation: had a bone density test in 08-2004.  T score was -2.4 Qualifier: Diagnosis of  By: Drue Novel MD, Nolon Rod     [facility] (subarachnoid hemorrhage) (HCC)    Scleroderma (HCC) 02/28/2016   Stroke [facility])    Takotsubo cardiomyopathy    s/p stress MI with stress induced CM with normal coronary arteries by cath 2007 with normalization of LVF by echo 04/2005   TIA (transient ischemic attack)    Vasculitis of skin 09/28/2012   Vitamin D deficiency 10/01/2014    Past Surgical History:  Procedure Laterality Date   ABDOMINAL HYSTERECTOMY  1977  no oophorectomy   APPENDECTOMY     CARDIAC CATHETERIZATION     CATARACT EXTRACTION, BILATERAL  2011   SPINAL FUSION  2000   Dr. Venetia Maxon   TONSILLECTOMY      Allergies  Allergen Reactions   Cephalexin Nausea And Vomiting    Pt stated made severely sick, will never take again   Nintedanib Diarrhea   Pioglitazone Other (See Comments)    REACTION: EDEMA   Sertraline Nausea And Vomiting    Allergies as of 08/21/2022       Reactions   Cephalexin Nausea And Vomiting   Pt stated made severely sick, will never take again   Nintedanib Diarrhea   Pioglitazone Other (See Comments)   REACTION: EDEMA   Sertraline Nausea And Vomiting        Medication List        Accurate as of August 21, 2022 11:59 PM. If you have any questions, ask your  nurse or doctor.          acetaminophen 500 MG tablet Commonly known as: TYLENOL Take 500 mg by mouth every 6 (six) hours as needed for mild pain or headache.   amLODipine 5 MG tablet Commonly known as: NORVASC Take 5 mg by mouth daily.   aspirin EC 81 MG tablet Take 81 mg by mouth daily. Swallow whole.   atorvastatin 80 MG tablet Commonly known as: LIPITOR Take 1 tablet (80 mg total) by mouth daily.   clonazePAM 0.5 MG tablet Commonly known as: KLONOPIN TAKE 1 TABLET TWICE DAILY AS NEEDED FOR ANXIETY   clopidogrel 75 MG tablet Commonly known as: PLAVIX Take 75 mg by mouth daily.   famotidine 40 MG tablet Commonly known as: PEPCID Take 1 tablet (40 mg total) by mouth daily.   furosemide 20 MG tablet Commonly known as: LASIX Take 1 tablet (20 mg total) by mouth daily as needed. Started by:  X    glimepiride 2 MG tablet Commonly known as: AMARYL Take 2 mg by mouth in the morning and at bedtime.   lisinopril 5 MG tablet Commonly known as: ZESTRIL Take 5 mg by mouth daily.   metoprolol succinate 50 MG 24 hr tablet Commonly known as: TOPROL-XL Take 1 tablet (50 mg total) by mouth daily. Take with or immediately following a meal.   NovoLOG FlexPen 100 UNIT/ML FlexPen Generic drug: insulin aspart Inject 10 Units into the skin 3 (three) times daily before meals.   ondansetron 4 MG tablet Commonly known as: Zofran Take 1 tablet (4 mg total) by mouth every 8 (eight) hours as needed for nausea or vomiting. Started by:  X    potassium chloride SA 20 MEQ tablet Commonly known as: KLOR-CON M Take 1 tablet (20 mEq total) by mouth as needed. Started by:  X         Review of Systems:  Review of Systems  Constitutional:  Negative for appetite change, chills, fatigue and fever.  HENT:  Negative for congestion.   Respiratory:  Negative for cough and wheezing.   Cardiovascular:  Positive for leg swelling. Negative for chest pain and  palpitations.  Gastrointestinal:  Positive for diarrhea, nausea and vomiting. Negative for abdominal distention.       Left flank pain, constant, feels insides.   Genitourinary:  Negative for dysuria and urgency.  Musculoskeletal:  Positive for arthralgias.  Skin:  Negative for rash.  Neurological:  Negative for dizziness, speech difficulty and headaches.  Psychiatric/Behavioral:  Negative for confusion. The patient is  not nervous/anxious.     Health Maintenance  Topic Date Due   OPHTHALMOLOGY EXAM  07/29/2017   DTaP/Tdap/Td (3 - Tdap) 06/19/2018   COVID-19 Vaccine (4 - 2023-24 season) 09/19/2021   Medicare Annual Wellness (AWV)  06/11/2022   INFLUENZA VACCINE  08/20/2022   Zoster Vaccines- Shingrix (1 of 2) 12/07/2022 (Originally 01/06/1953)   HEMOGLOBIN A1C  12/09/2022   FOOT EXAM  06/02/2023   Pneumonia Vaccine 53+ Years old  Completed   DEXA SCAN  Completed   HPV VACCINES  Aged Out   MAMMOGRAM  Discontinued    Physical Exam: Vitals:   08/21/22 1517  BP: (!) 142/68  Pulse: 67  Resp: 17  Temp: 97.8 F (36.6 C)  TempSrc: Temporal  SpO2: 95%  Weight: 122 lb 3.2 oz (55.4 kg)  Height: 5\' 1"  (1.549 m)   Body mass index is 23.09 kg/m. Physical Exam Vitals and nursing note reviewed.  Constitutional:      Appearance: Normal appearance.  HENT:     Head: Normocephalic and atraumatic.     Nose: Nose normal.     Mouth/Throat:     Mouth: Mucous membranes are moist.  Eyes:     Extraocular Movements: Extraocular movements intact.     Conjunctiva/sclera: Conjunctivae normal.     Pupils: Pupils are equal, round, and reactive to light.  Cardiovascular:     Rate and Rhythm: Normal rate and regular rhythm.  Pulmonary:     Effort: Pulmonary effort is normal.     Breath sounds: No rales.  Abdominal:     General: Bowel sounds are normal.     Palpations: Abdomen is soft.     Tenderness: There is no right CVA tenderness, left CVA tenderness, guarding or rebound.     Comments:  Left flank region tenderness when palpated, but the patient stated its tender constantly, feels something inside  Genitourinary:    Comments: External hemorrhoids noted, no injury or bleeding, negative FOBT Musculoskeletal:     Cervical back: Normal range of motion and neck supple.     Right lower leg: Edema present.     Left lower leg: Edema present.     Comments: Trace edema BLE  Skin:    General: Skin is warm and dry.  Neurological:     General: No focal deficit present.     Mental Status: She is alert and oriented to person, place, and time. Mental status is at baseline.  Psychiatric:        Mood and Affect: Mood normal.        Behavior: Behavior normal.        Thought Content: Thought content normal.     Labs reviewed: Basic Metabolic Panel: Recent Labs    06/08/22 0808 07/16/22 0010 08/21/22 1613  NA 143 140 137  K 4.6 4.1 5.2  CL 112* 108 109  CO2 23 17* 22  GLUCOSE 128* 159* 180*  BUN 40* 40* 47*  CREATININE 1.83* 1.64* 2.30*  CALCIUM 9.0 8.4* 8.8  TSH 3.50  --   --    Liver Function Tests: Recent Labs    01/04/22 1317 01/11/22 2048 06/08/22 0808 08/21/22 1613  AST 20 30 13 14   ALT 15 23 11 11   ALKPHOS 75 79  --   --   BILITOT 0.6 0.7 0.4 0.4  PROT 7.0 7.0 6.2 6.6  ALBUMIN 3.8 3.8  --   --    No results for input(s): "LIPASE", "AMYLASE" in the last 8760 hours.  No results for input(s): "AMMONIA" in the last 8760 hours. CBC: Recent Labs    01/11/22 2048 01/11/22 2052 06/08/22 0808 07/16/22 0010 08/21/22 1613  WBC 8.0  --  6.6 8.5 8.2  NEUTROABS 4.4  --  3,835  --  5,289  HGB 10.9*   < > 9.6* 10.7* 9.8*  HCT 33.8*   < > 29.9* 33.1* 29.6*  MCV 100.3*  --  97.1 101.2* 97.7  PLT 252  --  254 237 269   < > = values in this interval not displayed.   Lipid Panel: Recent Labs    01/05/22 0826 06/08/22 0808  CHOL 142 153  HDL 34* 40*  LDLCALC 59 80  TRIG 244* 240*  CHOLHDL 4.2 3.8   Lab Results  Component Value Date   HGBA1C 8.6 (H)  06/08/2022    Procedures since last visit: No results found.  Assessment/Plan  Nausea & vomiting On and off 2 weeks, takes Famotidine already, will have prn Zofran for symptoms.   Black stools On and off x 2 weeks, FBOT negative upon my examination, c/o left flank pain, constant, feels something inside, obtain CBC/diff, CMP/eGFR, UA C/S, hold ASA and Plavix if black stool persists.   Edema Trace BLE, mostly in ankles, denied SOB, cough, phlegm production, chest pain/pressure. Will have Furosemide 20mg /Kcl prn for swelling. Echocardiogram if s/s of CHF develops.   CAD (coronary artery disease) Hx of CAD, stroke, on ASA, Plavix, Statin   Essential hypertension Blood pressure is controlled,  taking Amlodipine, Lisinopril, Metoprolol.    Labs/tests ordered:  CBC/diff, CMP/eGFR, UA C/S  Next appt:  f/u 1 week.

## 2022-08-21 NOTE — Assessment & Plan Note (Signed)
Blood pressure is controlled,  taking Amlodipine, Lisinopril, Metoprolol.

## 2022-08-21 NOTE — Assessment & Plan Note (Signed)
On and off 2 weeks, takes Famotidine already, will have prn Zofran for symptoms.

## 2022-08-21 NOTE — Assessment & Plan Note (Signed)
On and off x 2 weeks, FBOT negative upon my examination, c/o left flank pain, constant, feels something inside, obtain CBC/diff, CMP/eGFR, UA C/S, hold ASA and Plavix if black stool persists.

## 2022-08-21 NOTE — Assessment & Plan Note (Signed)
Trace BLE, mostly in ankles, denied SOB, cough, phlegm production, chest pain/pressure. Will have Furosemide 20mg /Kcl prn for swelling. Echocardiogram if s/s of CHF develops.

## 2022-08-23 ENCOUNTER — Encounter: Payer: Self-pay | Admitting: Nurse Practitioner

## 2022-08-27 ENCOUNTER — Ambulatory Visit: Payer: Medicare HMO | Admitting: Family

## 2022-09-01 ENCOUNTER — Encounter: Payer: Self-pay | Admitting: Family

## 2022-09-01 ENCOUNTER — Ambulatory Visit (INDEPENDENT_AMBULATORY_CARE_PROVIDER_SITE_OTHER): Payer: Medicare HMO | Admitting: Family

## 2022-09-01 VITALS — BP 123/78 | HR 61 | Temp 97.1°F | Resp 18 | Ht 61.0 in | Wt 124.4 lb

## 2022-09-01 DIAGNOSIS — M792 Neuralgia and neuritis, unspecified: Secondary | ICD-10-CM | POA: Diagnosis not present

## 2022-09-01 DIAGNOSIS — F411 Generalized anxiety disorder: Secondary | ICD-10-CM | POA: Diagnosis not present

## 2022-09-01 DIAGNOSIS — I10 Essential (primary) hypertension: Secondary | ICD-10-CM | POA: Diagnosis not present

## 2022-09-01 DIAGNOSIS — K219 Gastro-esophageal reflux disease without esophagitis: Secondary | ICD-10-CM | POA: Diagnosis not present

## 2022-09-01 DIAGNOSIS — I2510 Atherosclerotic heart disease of native coronary artery without angina pectoris: Secondary | ICD-10-CM | POA: Diagnosis not present

## 2022-09-01 MED ORDER — AMLODIPINE BESYLATE 5 MG PO TABS: 5.0000 mg | ORAL_TABLET | Freq: Every day | ORAL | 1 refills | Status: DC

## 2022-09-01 MED ORDER — CLOPIDOGREL BISULFATE 75 MG PO TABS: 75.0000 mg | ORAL_TABLET | Freq: Every day | ORAL | 1 refills | Status: DC

## 2022-09-01 MED ORDER — GABAPENTIN 100 MG PO CAPS
100.0000 mg | ORAL_CAPSULE | Freq: Two times a day (BID) | ORAL | 3 refills | Status: DC | PRN
Start: 2022-09-01 — End: 2022-09-10

## 2022-09-01 MED ORDER — LISINOPRIL 5 MG PO TABS
5.0000 mg | ORAL_TABLET | Freq: Every day | ORAL | 1 refills | Status: DC
Start: 2022-09-01 — End: 2022-12-29

## 2022-09-01 MED ORDER — FAMOTIDINE 40 MG PO TABS: 40.0000 mg | ORAL_TABLET | Freq: Every day | ORAL | 1 refills | Status: DC

## 2022-09-01 MED ORDER — CLONAZEPAM 0.5 MG PO TABS
0.5000 mg | ORAL_TABLET | Freq: Two times a day (BID) | ORAL | 3 refills | Status: DC | PRN
Start: 2022-09-01 — End: 2022-11-30

## 2022-09-01 NOTE — Progress Notes (Signed)
Provider: Richarda Blade FNP-C  Joni Norrod, Donalee Citrin, NP  Patient Care Team: Laretha Luepke, Donalee Citrin, NP as PCP - General (Family Medicine) Quintella Reichert, MD as PCP - Cardiology (Cardiology) Micki Riley, MD as Consulting Physician (Neurology) Mateo Flow, MD as Consulting Physician (Ophthalmology) Meryl Dare, MD as Consulting Physician (Gastroenterology) Jonelle Sidle, MD as Consulting Physician (Cardiology) Meribeth Mattes, PA-C as Physician Assistant (Endocrinology) Delton See, MD (Inactive) as Consulting Physician (Rehabilitation) Terrial Rhodes, MD as Consulting Physician (Nephrology) Donzetta Starch, MD as Consulting Physician (Dermatology)  Extended Emergency Contact Information Primary Emergency Contact: Lianne Moris [place] Home Phone: [phone] Relation: Daughter Secondary Emergency Contact: [facility] Phone: [phone] Relation: Son Interpreter needed? No  Code Status:  Full Code  Goals of care: Advanced Directive information    09/01/2022    1:51 PM  Advanced Directives  Does Patient Have a Medical Advance Directive? Yes  Type of Estate agent of [place];Living will;Out of facility DNR (pink MOST or yellow form)  Does patient want to make changes to medical advance directive? No - Patient declined  Copy of Healthcare Power of Attorney in Chart? Yes - validated most recent copy scanned in chart (See row information)     Chief Complaint  Patient presents with   Follow-up    One week follow-up   Quality Metric Gaps    Needs to discuss Medicare Annual Wellness, DTAP, Covid and Flu vaccine.    HPI:  Pt is a 89 y.o. female seen today for an acute visit for one week follow up for leg swelling.  She was seen by Mast Maxie,NP on 08/21/2022 after presenting with nausea vomiting diarrhea black stool left flank pain and swelling on the ankles and feet.  Had left flank region tenderness on palpation and also  external hemorrhoids noted without any bleeding and was negative for FOBT.  Had bilateral trace edema. She was prescribed Zofran as needed for nausea.  Lab work done CBC, CMP UA CNS and was advised to hold aspirin and Plavix due to black stool.Furosemide 20 mEq as needed for swelling was started.Plan for echocardiogram if symptoms of CHF develops.  Took furosemide  x 4 times since last seen by Mast Maxie,NP on 08/21/2022 but has not required since then. She denies any cough,fatigue,chest tightness,chest pain,palpitation or shortness of breath. Also denies any abrupt weight gain.  Past Medical History:  Diagnosis Date   Achilles tendon injury 11/27/2010   ALLERGIC RHINITIS 05/12/2006   Qualifier: Diagnosis of  By: Drue Novel MD, Nolon Rod.    Anxiety state 07/03/2009   Qualifier: Diagnosis of  By: Drue Novel MD, Jose E.    Back pain 07/01/2010   Chronic kidney disease (CKD), stage III (moderate) (HCC) 05/31/2014   Chronic right SI joint pain 09/22/2013   DEGENERATIVE JOINT DISEASE, CERVICAL SPINE 06/23/2006   Annotation: had a CAT scan with a cervical myelogram that show  left C6-7 and  C5-6 foraminal stenosis Qualifier: Diagnosis of  By: Drue Novel MD, Nolon Rod.    DEPRESSION 05/12/2006   Qualifier: Diagnosis of  By: Drue Novel MD, Jose E.    DIABETES MELLITUS, TYPE II 05/12/2006   Now following w/ Endo at [facility]     DIABETIC  RETINOPATHY 04/27/2007   Qualifier: Diagnosis of  By: Janit Bern     Encephalopathy, hypertensive    Essential hypertension 05/12/2006   Qualifier: Diagnosis of  By: Drue Novel MD, Nolon Rod.    GAIT DISTURBANCE 08/07/2009   Qualifier:  Diagnosis of  By: Drue Novel MD, Nolon Rod.    GERD (gastroesophageal reflux disease)    History of colonoscopy    History of CT scan    History of mammogram    History of MRI    Hyperlipidemia associated with type 2 diabetes mellitus (HCC) 05/12/2006   Qualifier: Diagnosis of  By: Drue Novel MD, Nolon Rod.    ILD (interstitial lung disease) (HCC) 05/31/2015   NECK PAIN, CHRONIC  04/03/2008   Qualifier: Diagnosis of  By: Drue Novel MD, Nolon Rod.    Osteoarthritis 02/10/2016   Osteopenia    Osteoporosis 06/23/2006   Annotation: had a bone density test in 08-2004.  T score was -2.4 Qualifier: Diagnosis of  By: Drue Novel MD, Nolon Rod     SAH (subarachnoid hemorrhage) (HCC)    Scleroderma (HCC) 02/28/2016   Stroke (HCC)    Takotsubo cardiomyopathy    s/p stress MI with stress induced CM with normal coronary arteries by cath 2007 with normalization of LVF by echo 04/2005   TIA (transient ischemic attack)    Vasculitis of skin 09/28/2012   Vitamin D deficiency 10/01/2014   Past Surgical History:  Procedure Laterality Date   ABDOMINAL HYSTERECTOMY  1977   no oophorectomy   APPENDECTOMY     CARDIAC CATHETERIZATION     CATARACT EXTRACTION, BILATERAL  2011   SPINAL FUSION  2000   Dr. Venetia Maxon   TONSILLECTOMY      Allergies  Allergen Reactions   Cephalexin Nausea And Vomiting    Pt stated made severely sick, will never take again   Nintedanib Diarrhea   Pioglitazone Other (See Comments)    REACTION: EDEMA   Sertraline Nausea And Vomiting    Outpatient Encounter Medications as of 09/01/2022  Medication Sig   acetaminophen (TYLENOL) 500 MG tablet Take 500 mg by mouth every 6 (six) hours as needed for mild pain or headache.   amLODipine (NORVASC) 5 MG tablet Take 5 mg by mouth daily.   aspirin EC 81 MG tablet Take 81 mg by mouth daily. Swallow whole.   atorvastatin (LIPITOR) 80 MG tablet Take 1 tablet (80 mg total) by mouth daily.   clonazePAM (KLONOPIN) 0.5 MG tablet TAKE 1 TABLET TWICE DAILY AS NEEDED FOR ANXIETY   clopidogrel (PLAVIX) 75 MG tablet Take 75 mg by mouth daily.   famotidine (PEPCID) 40 MG tablet Take 1 tablet (40 mg total) by mouth daily.   furosemide (LASIX) 20 MG tablet Take 1 tablet (20 mg total) by mouth daily as needed.   glimepiride (AMARYL) 2 MG tablet Take 2 mg by mouth in the morning and at bedtime.   lisinopril (ZESTRIL) 5 MG tablet Take 5 mg by mouth  daily.   metoprolol succinate (TOPROL-XL) 50 MG 24 hr tablet Take 1 tablet (50 mg total) by mouth daily. Take with or immediately following a meal.   NOVOLOG FLEXPEN 100 UNIT/ML FlexPen Inject 10 Units into the skin 3 (three) times daily before meals.   ondansetron (ZOFRAN) 4 MG tablet Take 1 tablet (4 mg total) by mouth every 8 (eight) hours as needed for nausea or vomiting.   potassium chloride SA (KLOR-CON M) 20 MEQ tablet Take 1 tablet (20 mEq total) by mouth as needed.   No facility-administered encounter medications on file as of 09/01/2022.    Review of Systems  Constitutional:  Negative for appetite change, chills, fatigue, fever and unexpected weight change.  Respiratory:  Negative for cough, chest tightness, shortness of breath and wheezing.  Cardiovascular:  Negative for chest pain, palpitations and leg swelling.  Gastrointestinal:  Negative for abdominal distention, abdominal pain, blood in stool, constipation, diarrhea, nausea and vomiting.  Genitourinary:  Negative for difficulty urinating, dysuria, flank pain, frequency and urgency.  Musculoskeletal:  Negative for arthralgias, back pain, gait problem, joint swelling, myalgias, neck pain and neck stiffness.  Skin:  Negative for color change, pallor and rash.  Neurological:  Negative for dizziness, syncope, speech difficulty, weakness, light-headedness, numbness and headaches.  Hematological:  Does not bruise/bleed easily.    Immunization History  Administered Date(s) Administered   Fluad Quad(high Dose 65+) 10/20/2018   H1N1 01/05/2008   Influenza Split 10/22/2009, 12/09/2010, 10/24/2013, 10/01/2014, 09/14/2016   Influenza Whole 12/27/2006, 10/18/2007, 01/05/2008, 12/19/2008, 10/22/2009   Influenza, High Dose Seasonal PF 12/16/2015, 10/27/2017, 10/20/2018   Influenza,inj,Quad PF,6+ Mos 10/24/2013, 10/01/2014, 09/14/2016   Influenza,inj,quad, With Preservative 11/23/2016   Influenza-Unspecified 12/27/2006, 01/05/2008,  12/19/2008, 12/09/2010, 10/19/2012, 10/20/2018, 10/03/2019, 10/08/2020, 09/30/2021   Novel Infuenza-h1n1-09 01/05/2008   PFIZER(Purple Top)SARS-COV-2 Vaccination 05/01/2019, 05/22/2019, 11/28/2019   Pneumococcal Conjugate-13 11/02/2013   Pneumococcal Polysaccharide-23 11/19/2009   Td 06/18/2008   Td (Adult),5 Lf Tetanus Toxid, Preservative Free 06/18/2008   Zoster, Live 05/03/2007, 11/03/2007   Pertinent  Health Maintenance Due  Topic Date Due   OPHTHALMOLOGY EXAM  07/29/2017   INFLUENZA VACCINE  08/20/2022   HEMOGLOBIN A1C  12/09/2022   FOOT EXAM  06/02/2023   DEXA SCAN  Completed   MAMMOGRAM  Discontinued      01/06/2022   11:00 AM 01/11/2022    9:06 PM 06/02/2022    1:38 PM 08/21/2022    3:19 PM 09/01/2022    1:50 PM  Fall Risk  Falls in the past year?   0 0 0  Was there an injury with Fall?   0 0 0  Fall Risk Category Calculator   0 0 0  (RETIRED) Patient Fall Risk Level Moderate fall risk Moderate fall risk     Patient at Risk for Falls Due to   No Fall Risks No Fall Risks No Fall Risks  Fall risk Follow up   Falls evaluation completed Falls evaluation completed Falls evaluation completed   Functional Status Survey:    Vitals:   09/01/22 1328  BP: 123/78  Pulse: 61  Resp: 18  Temp: (!) 97.1 F (36.2 C)  SpO2: 96%  Weight: 124 lb 6.4 oz (56.4 kg)  Height: 5\' 1"  (1.549 m)   Body mass index is 23.51 kg/m. Physical Exam Vitals reviewed.  Constitutional:      General: She is not in acute distress.    Appearance: Normal appearance. She is normal weight. She is not ill-appearing or diaphoretic.  HENT:     Head: Normocephalic.     Right Ear: Tympanic membrane, ear canal and external ear normal. There is no impacted cerumen.     Left Ear: Tympanic membrane, ear canal and external ear normal. There is no impacted cerumen.     Nose: Nose normal. No congestion or rhinorrhea.     Mouth/Throat:     Mouth: Mucous membranes are moist.     Pharynx: Oropharynx is clear. No  oropharyngeal exudate or posterior oropharyngeal erythema.  Eyes:     General: No scleral icterus.       Right eye: No discharge.        Left eye: No discharge.     Extraocular Movements: Extraocular movements intact.     Conjunctiva/sclera: Conjunctivae normal.     Pupils:  Pupils are equal, round, and reactive to light.  Neck:     Vascular: No carotid bruit.  Cardiovascular:     Rate and Rhythm: Normal rate and regular rhythm.     Pulses: Normal pulses.     Heart sounds: Normal heart sounds. No murmur heard.    No friction rub. No gallop.  Pulmonary:     Effort: Pulmonary effort is normal. No respiratory distress.     Breath sounds: Normal breath sounds. No wheezing, rhonchi or rales.  Chest:     Chest wall: No tenderness.  Abdominal:     General: Bowel sounds are normal. There is no distension.     Palpations: Abdomen is soft. There is no mass.     Tenderness: There is no abdominal tenderness. There is no right CVA tenderness, left CVA tenderness, guarding or rebound.  Musculoskeletal:        General: No swelling or tenderness. Normal range of motion.     Cervical back: Normal range of motion. No rigidity or tenderness.     Right lower leg: No edema.     Left lower leg: No edema.  Lymphadenopathy:     Cervical: No cervical adenopathy.  Skin:    General: Skin is warm and dry.     Coloration: Skin is not pale.     Findings: No bruising, erythema, lesion or rash.  Neurological:     Mental Status: She is alert and oriented to person, place, and time.     Cranial Nerves: No cranial nerve deficit.     Sensory: No sensory deficit.     Motor: No weakness.     Coordination: Coordination normal.     Gait: Gait normal.  Psychiatric:        Mood and Affect: Mood normal.        Speech: Speech normal.        Behavior: Behavior normal.        Thought Content: Thought content normal.        Judgment: Judgment normal.     Labs reviewed: Recent Labs    06/08/22 0808  07/16/22 0010 08/21/22 1613  NA 143 140 137  K 4.6 4.1 5.2  CL 112* 108 109  CO2 23 17* 22  GLUCOSE 128* 159* 180*  BUN 40* 40* 47*  CREATININE 1.83* 1.64* 2.30*  CALCIUM 9.0 8.4* 8.8   Recent Labs    01/04/22 1317 01/11/22 2048 06/08/22 0808 08/21/22 1613  AST 20 30 13 14   ALT 15 23 11 11   ALKPHOS 75 79  --   --   BILITOT 0.6 0.7 0.4 0.4  PROT 7.0 7.0 6.2 6.6  ALBUMIN 3.8 3.8  --   --    Recent Labs    01/11/22 2048 01/11/22 2052 06/08/22 0808 07/16/22 0010 08/21/22 1613  WBC 8.0  --  6.6 8.5 8.2  NEUTROABS 4.4  --  3,835  --  5,289  HGB 10.9*   < > 9.6* 10.7* 9.8*  HCT 33.8*   < > 29.9* 33.1* 29.6*  MCV 100.3*  --  97.1 101.2* 97.7  PLT 252  --  254 237 269   < > = values in this interval not displayed.   Lab Results  Component Value Date   TSH 3.50 06/08/2022   Lab Results  Component Value Date   HGBA1C 8.6 (H) 06/08/2022   Lab Results  Component Value Date   CHOL 153 06/08/2022   HDL 40 (L) 06/08/2022  LDLCALC 80 06/08/2022   LDLDIRECT 110.0 10/13/2016   TRIG 240 (H) 06/08/2022   CHOLHDL 3.8 06/08/2022    Significant Diagnostic Results in last 30 days:  No results found.  Assessment/Plan  1. Essential hypertension B/p well controlled  - continue on Metroprolol, amlodipine, fluorescein hide as needed and lisinopril - amLODipine (NORVASC) 5 MG tablet; Take 1 tablet (5 mg total) by mouth daily.  Dispense: 90 tablet; Refill: 1 - lisinopril (ZESTRIL) 5 MG tablet; Take 1 tablet (5 mg total) by mouth daily.  Dispense: 90 tablet; Refill: 1  2. Generalized anxiety disorder Stable  continue on clonazepam as needed - PDMP reviewed - clonazePAM (KLONOPIN) 0.5 MG tablet; Take 1 tablet (0.5 mg total) by mouth 2 (two) times daily as needed for anxiety.  Dispense: 45 tablet; Refill: 3  3. Coronary artery disease involving native coronary artery of native heart without angina pectoris Chest pain free - continue on clopidogrel - clopidogrel (PLAVIX) 75  MG tablet; Take 1 tablet (75 mg total) by mouth daily.  Dispense: 90 tablet; Refill: 1  4. Gastroesophageal reflux disease without esophagitis No bleeding or dark stool reported  - famotidine (PEPCID) 40 MG tablet; Take 1 tablet (40 mg total) by mouth daily.  Dispense: 90 tablet; Refill: 1  5. Neuropathic pain Continue on gabapentin  - gabapentin (NEURONTIN) 100 MG capsule; Take 1 capsule (100 mg total) by mouth 2 (two) times daily as needed.  Dispense: 90 capsule; Refill: 3  Family/ staff Communication: Reviewed plan of care with patient verbalized understanding  Labs/tests ordered: None   Next Appointment: Return if symptoms worsen or fail to improve.   [name], NP

## 2022-09-02 DIAGNOSIS — N1832 Chronic kidney disease, stage 3b: Secondary | ICD-10-CM | POA: Diagnosis not present

## 2022-09-05 DIAGNOSIS — I959 Hypotension, unspecified: Secondary | ICD-10-CM | POA: Diagnosis not present

## 2022-09-05 DIAGNOSIS — E162 Hypoglycemia, unspecified: Secondary | ICD-10-CM | POA: Diagnosis not present

## 2022-09-05 DIAGNOSIS — I10 Essential (primary) hypertension: Secondary | ICD-10-CM | POA: Diagnosis not present

## 2022-09-08 DIAGNOSIS — E1122 Type 2 diabetes mellitus with diabetic chronic kidney disease: Secondary | ICD-10-CM | POA: Diagnosis not present

## 2022-09-08 DIAGNOSIS — N2581 Secondary hyperparathyroidism of renal origin: Secondary | ICD-10-CM | POA: Diagnosis not present

## 2022-09-08 DIAGNOSIS — D631 Anemia in chronic kidney disease: Secondary | ICD-10-CM | POA: Diagnosis not present

## 2022-09-08 DIAGNOSIS — I129 Hypertensive chronic kidney disease with stage 1 through stage 4 chronic kidney disease, or unspecified chronic kidney disease: Secondary | ICD-10-CM | POA: Diagnosis not present

## 2022-09-08 DIAGNOSIS — I5181 Takotsubo syndrome: Secondary | ICD-10-CM | POA: Diagnosis not present

## 2022-09-08 DIAGNOSIS — I2510 Atherosclerotic heart disease of native coronary artery without angina pectoris: Secondary | ICD-10-CM | POA: Diagnosis not present

## 2022-09-08 DIAGNOSIS — E875 Hyperkalemia: Secondary | ICD-10-CM | POA: Diagnosis not present

## 2022-09-08 DIAGNOSIS — N179 Acute kidney failure, unspecified: Secondary | ICD-10-CM | POA: Diagnosis not present

## 2022-09-08 DIAGNOSIS — N1832 Chronic kidney disease, stage 3b: Secondary | ICD-10-CM | POA: Diagnosis not present

## 2022-09-10 ENCOUNTER — Ambulatory Visit: Payer: Medicare HMO | Admitting: Neurology

## 2022-09-10 ENCOUNTER — Encounter: Payer: Self-pay | Admitting: Neurology

## 2022-09-10 VITALS — BP 137/54 | HR 66 | Ht 60.0 in | Wt 123.0 lb

## 2022-09-10 DIAGNOSIS — F410 Panic disorder [episodic paroxysmal anxiety] without agoraphobia: Secondary | ICD-10-CM | POA: Diagnosis not present

## 2022-09-10 DIAGNOSIS — Z8673 Personal history of transient ischemic attack (TIA), and cerebral infarction without residual deficits: Secondary | ICD-10-CM | POA: Diagnosis not present

## 2022-09-10 DIAGNOSIS — G459 Transient cerebral ischemic attack, unspecified: Secondary | ICD-10-CM

## 2022-09-10 MED ORDER — CITALOPRAM HYDROBROMIDE 10 MG PO TABS
10.0000 mg | ORAL_TABLET | Freq: Every day | ORAL | 2 refills | Status: DC
Start: 2022-09-10 — End: 2022-12-07

## 2022-09-10 NOTE — Progress Notes (Signed)
[facility] [address] [place]. [place] [address] [phone]       OFFICE FOLLOW-UP NOTE  Sandra [name] Date of Birth:  1933/07/27 Medical Record Number:  [id number]   HPI: Initial visit 02/12/2022 [name] is a pleasant 89 year old Caucasian lady seen today for initial office follow-up visit following hospital consultation for TIA in December 2023.  She is accompanied by her daughter today.  History is obtained from them and review of electronic medical records.  I have personally reviewed pertinent available imaging films in PACS.[name] is a 89 y.o. female with past medical history of MI, CKD, DM, HTN, HA, HLD, SAH, TIA, ICH w/SDH 2021, and cognitive impairment.  she was at the [employer] attempting to order a drink when she suddenly developed inability to speak, with left facial droop on 01/04/2022. Code stroke was activated by [facility]. LKW 1210. CT head no acute process, ASPECTS 10.  IV thrombolysis was considered because of low NIH stroke scale and rapidly improving exam.  MRI scan of the brain showed no acute intracranial process.  There is remote areas of hemorrhage in the right temporal and occipital lobe with chronic hemorrhagic foci sequelae of small vessel disease bilaterally.  Carotid ultrasound showed bilateral 1-39% stenosis.  2D echo showed ejection fraction of 60 to 65%.  LDL cholesterol was elevated at 102 and hemoglobin A1c at 10.0.  Patient had been on aspirin prior to admission he was discharged on aspirin and Plavix for 3 weeks followed by Plavix alone.  She has done well since discharge without any other recurrent episodes of TIA or stroke.  Her short-term memory continues to be for activities of daily living.  Lives with her daughter.  She only needs some help with her medications and her ability.  She is tolerating Plavix well without bruising or bleeding.  Her blood pressure is under good control today it is 134/51.  She has is tolerating  Lipitor well without muscle aches and pains.  She has no new complaints   Update 09/10/2022 : She returns for follow-up after last visit 7 months ago.  She is accompanied by her daughter [name] who provides history.  She states she is doing well.  She feels she may have had possible TIA off-and-on about once or twice a month.  Patient has transient episodes where her speech becomes slightly slurred.  She becomes anxious and starts shaking.  Her blood pressure has been found to be elevated.  She comes down after a little while if she sits down quietly and rest.  Taking an extra dose of blood pressure medicine or anxiety medication usually calms her down.  She denies loss of vision facial weakness extremity weakness or numbness during these episodes.  She does use Klonopin 0.5 mg as needed for anxiety but is not on any regular daily medication for anxiety prevention.  She had lab work on 03/13/2022 which showed LDL-cholesterol to be optimal at 65 mg percent but hemoglobin A1c was elevated at 9.1.  She is working with her primary care physician to get better control of diabetes.  She remains on aspirin and Plavix she is tolerating well with minor bruising and no bleeding. ROS:   14 system review of systems is positive for facial droop, speech difficulty, memory loss, anxiety, tremors all other systems negative  PMH:  Past Medical History:  Diagnosis Date   Achilles tendon injury 11/27/2010   ALLERGIC RHINITIS 05/12/2006   Qualifier: Diagnosis of  By: Drue Novel MD,  Jose E.    Anxiety state 07/03/2009   Qualifier: Diagnosis of  By: Drue Novel MD, Jose E.    Back pain 07/01/2010   Chronic kidney disease (CKD), stage III (moderate) (HCC) 05/31/2014   Chronic right SI joint pain 09/22/2013   DEGENERATIVE JOINT DISEASE, CERVICAL SPINE 06/23/2006   Annotation: had a CAT scan with a cervical myelogram that show  left C6-7 and  C5-6 foraminal stenosis Qualifier: Diagnosis of  By: Drue Novel MD, Nolon Rod.    DEPRESSION 05/12/2006    Qualifier: Diagnosis of  By: Drue Novel MD, Jose E.    DIABETES MELLITUS, TYPE II 05/12/2006   Now following w/ Endo at [facility]     DIABETIC  RETINOPATHY 04/27/2007   Qualifier: Diagnosis of  By: Janit Bern     Encephalopathy, hypertensive    Essential hypertension 05/12/2006   Qualifier: Diagnosis of  By: Drue Novel MD, Nolon Rod.    GAIT DISTURBANCE 08/07/2009   Qualifier: Diagnosis of  By: Drue Novel MD, Nolon Rod.    GERD (gastroesophageal reflux disease)    History of colonoscopy    History of CT scan    History of mammogram    History of MRI    Hyperlipidemia associated with type 2 diabetes mellitus (HCC) 05/12/2006   Qualifier: Diagnosis of  By: Drue Novel MD, Nolon Rod.    ILD (interstitial lung disease) (HCC) 05/31/2015   NECK PAIN, CHRONIC 04/03/2008   Qualifier: Diagnosis of  By: Drue Novel MD, Nolon Rod.    Osteoarthritis 02/10/2016   Osteopenia    Osteoporosis 06/23/2006   Annotation: had a bone density test in 08-2004.  T score was -2.4 Qualifier: Diagnosis of  By: Drue Novel MD, Nolon Rod     SAH (subarachnoid hemorrhage) (HCC)    Scleroderma (HCC) 02/28/2016   Stroke [facility])    Takotsubo cardiomyopathy    s/p stress MI with stress induced CM with normal coronary arteries by cath 2007 with normalization of LVF by echo 04/2005   TIA (transient ischemic attack)    Vasculitis of skin 09/28/2012   Vitamin D deficiency 10/01/2014    Social History:  Social History   Socioeconomic History   Marital status: Divorced    Spouse name: Not on file   Number of children: 2   Years of education: Not on file   Highest education level: Not on file  Occupational History   Occupation: Retired    Comment: Accounting  Tobacco Use   Smoking status: Never    Passive exposure: Yes   Smokeless tobacco: Never   Tobacco comments:    Mother & Children  Vaping Use   Vaping status: Never Used  Substance and Sexual Activity   Alcohol use: Yes    Alcohol/week: 1.0 standard drink of alcohol    Types: 1 Glasses of wine per week     Comment: rarely   Drug use: No   Sexual activity: Never  Other Topics Concern   Not on file  Social History Narrative   10/21/20 Lives with her daughter and G-daughter   Diet- working on portion control   Exercise- no routine exercise but active      [facility]:   Originally from [place]. Always lived in [place]. Previously has traveled to [place], [place], [place], [place], & [address]. Previously did accounting and clerical work. Has a dog currently. Remote exposure to a parakeet. No mold exposure.       Tobacco use, amount per day now: None   Past tobacco use,  amount per day: None   How many years did you use tobacco: None   Alcohol use (drinks per week): None   Diet: Diabetic    Do you drink/eat things with caffeine:   Marital status:                                  What year were you married?   Do you live in a house, apartment, assisted living, condo, trailer, etc.? House   Is it one or more stories? One   How many persons live in your home? 3   Do you have pets in your home?( please list) No   Highest Level of education completed? High School   Current or past profession:Accounting    Do you exercise?    No                              Type and how often? N/A   Do you have a living will? Yes   Do you have a DNR form?                                   If not, do you want to discuss one?   Do you have signed POA/HPOA forms?    Yes                    If so, please bring to you appointment      Do you have any difficulty bathing or dressing yourself? Yes   Do you have any difficulty preparing food or eating?  Yes   Do you have any difficulty managing your medications? Yes   Do you have any difficulty managing your finances? Yes   Do you have any difficulty affording your medications?  Sometimes   Social Determinants of Health   Financial Resource Strain: Not on file  Food Insecurity: No Food Insecurity (01/05/2022)   Hunger Vital Sign    Worried About Running Out of Food in the Last  Year: Never true    Ran Out of Food in the Last Year: Never true  Transportation Needs: No Transportation Needs (01/05/2022)   PRAPARE - Administrator, Civil Service (Medical): No    Lack of Transportation (Non-Medical): No  Physical Activity: Not on file  Stress: Not on file  Social Connections: Not on file  Intimate Partner Violence: Not At Risk (01/05/2022)   Humiliation, Afraid, Rape, and Kick questionnaire    Fear of Current or Ex-Partner: No    Emotionally Abused: No    Physically Abused: No    Sexually Abused: No    Medications:   Current Outpatient Medications on File Prior to Visit  Medication Sig Dispense Refill   acetaminophen (TYLENOL) 500 MG tablet Take 500 mg by mouth every 6 (six) hours as needed for mild pain or headache.     amLODipine (NORVASC) 5 MG tablet Take 1 tablet (5 mg total) by mouth daily. 90 tablet 1   aspirin EC 81 MG tablet Take 81 mg by mouth daily. Swallow whole.     atorvastatin (LIPITOR) 80 MG tablet Take 1 tablet (80 mg total) by mouth daily. 90 tablet 1   clonazePAM (KLONOPIN) 0.5 MG tablet Take 1 tablet (0.5 mg total) by mouth 2 (two)  times daily as needed for anxiety. 45 tablet 3   clopidogrel (PLAVIX) 75 MG tablet Take 1 tablet (75 mg total) by mouth daily. 90 tablet 1   famotidine (PEPCID) 40 MG tablet Take 1 tablet (40 mg total) by mouth daily. 90 tablet 1   furosemide (LASIX) 20 MG tablet Take 1 tablet (20 mg total) by mouth daily as needed. 30 tablet 3   glimepiride (AMARYL) 2 MG tablet Take 2 mg by mouth in the morning and at bedtime.     lisinopril (ZESTRIL) 5 MG tablet Take 1 tablet (5 mg total) by mouth daily. 90 tablet 1   metoprolol succinate (TOPROL-XL) 50 MG 24 hr tablet Take 1 tablet (50 mg total) by mouth daily. Take with or immediately following a meal. 30 tablet 0   NOVOLOG FLEXPEN 100 UNIT/ML FlexPen Inject 6-8 Units into the skin 3 (three) times daily before meals. Pt completes sliding scale.     No current  facility-administered medications on file prior to visit.    Allergies:   Allergies  Allergen Reactions   Cephalexin Nausea And Vomiting    Pt stated made severely sick, will never take again   Nintedanib Diarrhea   Pioglitazone Other (See Comments)    REACTION: EDEMA   Sertraline Nausea And Vomiting    Physical Exam General: Pleasant frail elderly Caucasian lady, seated, in no evident distress Head: head normocephalic and atraumatic.  Neck: supple with no carotid or supraclavicular bruits Cardiovascular: regular rate and rhythm, no murmurs Musculoskeletal: no deformity Skin:  no rash/petichiae Vascular:  Normal pulses all extremities Vitals:   09/10/22 1419  BP: (!) 137/54  Pulse: 66   Neurologic Exam Mental Status: Awake and fully alert. Oriented to place and time. Recent and remote memory intact. Attention span, concentration and fund of knowledge appropriate. Mood and affect appropriate.  Cranial Nerves: Fundoscopic exam reveals sharp disc margins. Pupils equal, briskly reactive to light. Extraocular movements full without nystagmus. Visual fields  show left inferior quadrant vision loss in the left eye to confrontation. Hearing intact. Facial sensation intact. Face, tongue, palate moves normally and symmetrically.  Motor: Normal bulk and tone. Normal strength in all tested extremity muscles. Sensory.: intact to touch ,pinprick .position and vibratory sensation.  Coordination: Rapid alternating movements normal in all extremities. Finger-to-nose and heel-to-shin performed accurately bilaterally. Gait and Station: Arises from chair without difficulty. Stance is normal. Gait is slightly broad-based but normal step length and balance . Able to heel, toe and tandem walk with great difficulty.  Reflexes: 1+ and symmetric. Toes downgoing.       ASSESSMENT: 89 year old lady with transient episode of left facial droop and speech difficulties likely due to right brain subcortical  TIA in December 2023 from small vessel disease.  Vascular risk factors of hypertension, hyperlipidemia, diabetes and age.  She is having some transient episodes which sound like anxiety attacks.     PLAN: I had a long d/w patient and her daughter about her recent TIAs, risk for recurrent stroke/TIAs, personally independently reviewed imaging studies and stroke evaluation results and answered questions.Continue Plavix 75 mg daily for secondary stroke prevention and maintain strict control of hypertension with blood pressure goal below 130/90, diabetes with hemoglobin A1c goal below 6.5% and lipids with LDL cholesterol goal below 70 mg/dL. I also advised the patient to eat a healthy diet with plenty of whole grains, cereals, fruits and vegetables, exercise regularly and maintain ideal body weight I advised her to follow-up with her primary care  physician for better blood pressure and diabetes control.  Check screening carotid ultrasound study.  Trial of Celexa 10 mg daily to help with her anxiety and continue to use Klonopin 0.5 mg as needed as well.  Return for follow-up in the future in 3 months with my nurse practitioner or call earlier if necessaryGreater than 50% of time during this 35 minute visit was spent on counseling,explanation of diagnosis TIA, planning of further management, discussion with patient and family and coordination of care Delia Heady,  MD Note: This document was prepared with digital dictation and possible smart phrase technology. Any transcriptional errors that result from this process are unintentional

## 2022-09-10 NOTE — Patient Instructions (Signed)
I had a long d/w patient and her daughter about her recent TIAs, risk for recurrent stroke/TIAs, personally independently reviewed imaging studies and stroke evaluation results and answered questions.Continue Plavix 75 mg daily for secondary stroke prevention and maintain strict control of hypertension with blood pressure goal below 130/90, diabetes with hemoglobin A1c goal below 6.5% and lipids with LDL cholesterol goal below 70 mg/dL. I also advised the patient to eat a healthy diet with plenty of whole grains, cereals, fruits and vegetables, exercise regularly and maintain ideal body weight I advised her to follow-up with her primary care physician for better blood pressure and diabetes control.  Check screening carotid ultrasound study.  Trial of Celexa 10 mg daily to help with her anxiety and continue to use Klonopin 0.5 mg as needed as well.  Return for follow-up in the future in 3 months with my nurse practitioner or call earlier if necessary

## 2022-09-18 ENCOUNTER — Ambulatory Visit (HOSPITAL_COMMUNITY)
Admission: RE | Admit: 2022-09-18 | Discharge: 2022-09-18 | Disposition: A | Discharge status: Home / Self Care | Payer: Medicare HMO | Source: Ambulatory Visit | Attending: Neurology | Admitting: Neurology

## 2022-09-18 DIAGNOSIS — G479 Sleep disorder, unspecified: Secondary | ICD-10-CM | POA: Diagnosis not present

## 2022-09-18 DIAGNOSIS — Z8673 Personal history of transient ischemic attack (TIA), and cerebral infarction without residual deficits: Secondary | ICD-10-CM | POA: Diagnosis not present

## 2022-09-21 NOTE — Progress Notes (Signed)
Kindly inform the patient that carotid ultrasound study shows no significant narrowing of either carotid artery in the neck

## 2022-09-23 ENCOUNTER — Telehealth: Payer: Self-pay

## 2022-09-23 NOTE — Telephone Encounter (Signed)
Called patient to informed her per Dr Pearlean Brownie "Kindly inform the patient that carotid ultrasound study shows no significant narrowing of either carotid artery in the neck." Pt verbalized understanding. Pt had no questions at this time but was encouraged to call back if questions arise.

## 2022-09-23 NOTE — Telephone Encounter (Signed)
-----   Message from Delia Heady sent at 09/21/2022  9:01 PM EDT ----- Joneen Roach inform the patient that carotid ultrasound study shows no significant narrowing of either carotid artery in the neck.

## 2022-09-25 NOTE — Progress Notes (Signed)
Kindly inform the patient that carotid ultrasound study shows no significant blockages of either carotid artery in the neck on both sides

## 2022-09-29 ENCOUNTER — Telehealth: Payer: Self-pay | Admitting: Neurology

## 2022-09-29 NOTE — Telephone Encounter (Signed)
Form given to Sandra [name] in medical records to send for the pt

## 2022-09-29 NOTE — Telephone Encounter (Signed)
Received a form from the patient's dentist notifying that the patient has to have 2 teeth extracted. They are asking for neurological clearance and guidance. Dr Pearlean Brownie is out of the office. Will placed on Jessica, NP's desk for her to review.

## 2022-09-29 NOTE — Telephone Encounter (Signed)
Recently seen by Dr. Pearlean Brownie and appear to be stable from neurological standpoint with prior TIA 12/2021.  Currently on Plavix, unsure if this is to be held but if so, can hold for 2 to 3 days prior with small but acceptable risk of recurrent stroke/TIA off therapy, recommend restarting immediately after once stable.  In regards to need of antibiotic prior to dental treatment, this can be determined by PCP  Form signed and placed in outbox.  Thank you.

## 2022-11-26 ENCOUNTER — Other Ambulatory Visit: Payer: Self-pay | Admitting: Family

## 2022-11-30 ENCOUNTER — Ambulatory Visit (INDEPENDENT_AMBULATORY_CARE_PROVIDER_SITE_OTHER): Payer: Medicare HMO | Admitting: Family

## 2022-11-30 ENCOUNTER — Encounter: Payer: Self-pay | Admitting: Family

## 2022-11-30 VITALS — BP 138/52 | HR 63 | Resp 20 | Ht 60.0 in | Wt 119.4 lb

## 2022-11-30 DIAGNOSIS — N184 Chronic kidney disease, stage 4 (severe): Secondary | ICD-10-CM

## 2022-11-30 DIAGNOSIS — I2510 Atherosclerotic heart disease of native coronary artery without angina pectoris: Secondary | ICD-10-CM

## 2022-11-30 DIAGNOSIS — E781 Pure hyperglyceridemia: Secondary | ICD-10-CM

## 2022-11-30 DIAGNOSIS — I129 Hypertensive chronic kidney disease with stage 1 through stage 4 chronic kidney disease, or unspecified chronic kidney disease: Secondary | ICD-10-CM

## 2022-11-30 DIAGNOSIS — M792 Neuralgia and neuritis, unspecified: Secondary | ICD-10-CM | POA: Diagnosis not present

## 2022-11-30 DIAGNOSIS — F411 Generalized anxiety disorder: Secondary | ICD-10-CM | POA: Diagnosis not present

## 2022-11-30 DIAGNOSIS — Z794 Long term (current) use of insulin: Secondary | ICD-10-CM | POA: Diagnosis not present

## 2022-11-30 DIAGNOSIS — Z23 Encounter for immunization: Secondary | ICD-10-CM | POA: Diagnosis not present

## 2022-11-30 DIAGNOSIS — N1832 Chronic kidney disease, stage 3b: Secondary | ICD-10-CM | POA: Diagnosis not present

## 2022-11-30 DIAGNOSIS — E0822 Diabetes mellitus due to underlying condition with diabetic chronic kidney disease: Secondary | ICD-10-CM | POA: Diagnosis not present

## 2022-11-30 MED ORDER — CLONAZEPAM 0.5 MG PO TABS
0.5000 mg | ORAL_TABLET | Freq: Two times a day (BID) | ORAL | 3 refills | Status: DC | PRN
Start: 2022-11-30 — End: 2023-02-17

## 2022-11-30 NOTE — Progress Notes (Signed)
Provider: Richarda Blade FNP-C   Terrence Wishon, Donalee Citrin, NP  Patient Care Team: Bria Sparr, Donalee Citrin, NP as PCP - General (Family Medicine) Quintella Reichert, MD as PCP - Cardiology (Cardiology) Micki Riley, MD as Consulting Physician (Neurology) Mateo Flow, MD as Consulting Physician (Ophthalmology) Meryl Dare, MD as Consulting Physician (Gastroenterology) Jonelle Sidle, MD as Consulting Physician (Cardiology) Meribeth Mattes, PA-C as Physician Assistant (Endocrinology) Delton See, MD (Inactive) as Consulting Physician (Rehabilitation) Terrial Rhodes, MD as Consulting Physician (Nephrology) Donzetta Starch, MD as Consulting Physician (Dermatology)  Extended Emergency Contact Information Primary Emergency Contact: Lianne Moris [place] of [place] Home Phone: [phone] Relation: Daughter Secondary Emergency Contact: [facility] Phone: [phone] Relation: Son Interpreter needed? No  Code Status:  Full Code  Goals of care: Advanced Directive information    09/01/2022    1:51 PM  Advanced Directives  Does Patient Have a Medical Advance Directive? Yes  Type of Estate agent of [place];Living will;Out of facility DNR (pink MOST or yellow form)  Does patient want to make changes to medical advance directive? No - Patient declined  Copy of Healthcare Power of Attorney in Chart? Yes - validated most recent copy scanned in chart (See row information)     Chief Complaint  Patient presents with   Medical Management of Chronic Issues    Patient presents today for 3 month follow-up   Quality Metric Gaps    AWV, TDAP, eye exam, COVID#4, flu    HPI:  Pt is a 89 y.o. female seen today for 3 months follow up for medical management of chronic diseases. She is here with her daughter who provides additional HPI information.   Type 2 DM - recalls CBG in the low 100's in the morning 200's in the evening.states has not been using insulin  Has been following up with Endocrinologist but provider quit the practice.states [facility]  on December 24 th 2024.she would like a referral to another Endocrinologist in [place].she does not want to [place] [address] to [facility]. Follows up with DR.Elmer Picker but would like another referral.   Generalized anxiety - clonazepam effective.request refill.   Hypertension - takes blood pressure medication as prescribed.No home blood pressure for review.she denies any headache,dizziness,vision changes,fatigue,chest tightness,palpitation,chest pain or shortness of breath.       Past Medical History:  Diagnosis Date   Achilles tendon injury 11/27/2010   ALLERGIC RHINITIS 05/12/2006   Qualifier: Diagnosis of  By: Drue Novel MD, Nolon Rod.    Anxiety state 07/03/2009   Qualifier: Diagnosis of  By: Drue Novel MD, Jose E.    Back pain 07/01/2010   Chronic kidney disease (CKD), stage III (moderate) (HCC) 05/31/2014   Chronic right SI joint pain 09/22/2013   DEGENERATIVE JOINT DISEASE, CERVICAL SPINE 06/23/2006   Annotation: had a CAT scan with a cervical myelogram that show  left C6-7 and  C5-6 foraminal stenosis Qualifier: Diagnosis of  By: Drue Novel MD, Nolon Rod.    DEPRESSION 05/12/2006   Qualifier: Diagnosis of  By: Drue Novel MD, Jose E.    DIABETES MELLITUS, TYPE II 05/12/2006   Now following w/ Endo at [facility]     DIABETIC  RETINOPATHY 04/27/2007   Qualifier: Diagnosis of  By: Janit Bern     Encephalopathy, hypertensive    Essential hypertension 05/12/2006   Qualifier: Diagnosis of  By: Drue Novel MD, Nolon Rod.    GAIT DISTURBANCE 08/07/2009   Qualifier: Diagnosis of  By: Drue Novel MD, [facility]  E.    GERD (gastroesophageal reflux disease)    History of colonoscopy    History of CT scan    History of mammogram    History of MRI    Hyperlipidemia associated with type 2 diabetes mellitus (HCC) 05/12/2006   Qualifier: Diagnosis of  By: Drue Novel MD, Nolon Rod.    ILD (interstitial lung disease) (HCC)  05/31/2015   NECK PAIN, CHRONIC 04/03/2008   Qualifier: Diagnosis of  By: Drue Novel MD, Nolon Rod.    Osteoarthritis 02/10/2016   Osteopenia    Osteoporosis 06/23/2006   Annotation: had a bone density test in 08-2004.  T score was -2.4 Qualifier: Diagnosis of  By: Drue Novel MD, Nolon Rod     [facility] (subarachnoid hemorrhage) (HCC)    Scleroderma (HCC) 02/28/2016   Stroke (HCC)    Takotsubo cardiomyopathy    s/p stress MI with stress induced CM with normal coronary arteries by cath 2007 with normalization of LVF by echo 04/2005   TIA (transient ischemic attack)    Vasculitis of skin 09/28/2012   Vitamin D deficiency 10/01/2014   Past Surgical History:  Procedure Laterality Date   ABDOMINAL HYSTERECTOMY  1977   no oophorectomy   APPENDECTOMY     CARDIAC CATHETERIZATION     CATARACT EXTRACTION, BILATERAL  2011   SPINAL FUSION  2000   Dr. Venetia Maxon   TONSILLECTOMY      Allergies  Allergen Reactions   Cephalexin Nausea And Vomiting    Pt stated made severely sick, will never take again   Nintedanib Diarrhea   Pioglitazone Other (See Comments)    REACTION: EDEMA   Sertraline Nausea And Vomiting    Allergies as of 11/30/2022       Reactions   Cephalexin Nausea And Vomiting   Pt stated made severely sick, will never take again   Nintedanib Diarrhea   Pioglitazone Other (See Comments)   REACTION: EDEMA   Sertraline Nausea And Vomiting        Medication List        Accurate as of November 30, 2022  3:33 PM. If you have any questions, ask your nurse or doctor.          acetaminophen 500 MG tablet Commonly known as: TYLENOL Take 500 mg by mouth every 6 (six) hours as needed for mild pain or headache.   amLODipine 5 MG tablet Commonly known as: NORVASC Take 1 tablet (5 mg total) by mouth daily.   aspirin EC 81 MG tablet Take 81 mg by mouth daily. Swallow whole.   atorvastatin 80 MG tablet Commonly known as: LIPITOR TAKE 1 TABLET EVERY DAY   citalopram 10 MG tablet Commonly known  as: CeleXA Take 1 tablet (10 mg total) by mouth daily.   clonazePAM 0.5 MG tablet Commonly known as: KLONOPIN Take 1 tablet (0.5 mg total) by mouth 2 (two) times daily as needed for anxiety.   clopidogrel 75 MG tablet Commonly known as: PLAVIX Take 1 tablet (75 mg total) by mouth daily.   famotidine 40 MG tablet Commonly known as: PEPCID Take 1 tablet (40 mg total) by mouth daily.   furosemide 20 MG tablet Commonly known as: LASIX Take 1 tablet (20 mg total) by mouth daily as needed.   glimepiride 2 MG tablet Commonly known as: AMARYL Take 2 mg by mouth in the morning and at bedtime.   lisinopril 5 MG tablet Commonly known as: ZESTRIL Take 1 tablet (5 mg total) by mouth daily.   metoprolol  succinate 50 MG 24 hr tablet Commonly known as: TOPROL-XL Take 1 tablet (50 mg total) by mouth daily. Take with or immediately following a meal.   NovoLOG FlexPen 100 UNIT/ML FlexPen Generic drug: insulin aspart Inject 6-8 Units into the skin 3 (three) times daily before meals. Pt completes sliding scale.        Review of Systems  Constitutional:  Negative for appetite change, chills, fatigue, fever and unexpected weight change.  HENT:  Negative for congestion, dental problem, ear discharge, ear pain, facial swelling, hearing loss, nosebleeds, postnasal drip, rhinorrhea, sinus pressure, sinus pain, sneezing, sore throat, tinnitus and trouble swallowing.   Eyes:  Negative for pain, discharge, redness, itching and visual disturbance.  Respiratory:  Negative for cough, chest tightness, shortness of breath and wheezing.   Cardiovascular:  Negative for chest pain, palpitations and leg swelling.  Gastrointestinal:  Negative for abdominal distention, abdominal pain, blood in stool, constipation, diarrhea, nausea and vomiting.  Endocrine: Negative for cold intolerance, heat intolerance, polydipsia, polyphagia and polyuria.  Genitourinary:  Negative for difficulty urinating, dysuria, flank  pain, frequency and urgency.  Musculoskeletal:  Negative for arthralgias, back pain, gait problem, joint swelling, myalgias, neck pain and neck stiffness.  Skin:  Negative for color change, pallor, rash and wound.  Neurological:  Positive for numbness. Negative for dizziness, syncope, speech difficulty, weakness, light-headedness and headaches.       Numbness on feet  Left flank area previous shingles site with some tingling sensation but no rash not aware that Gabapentin was prescribed for post neuropathic pain.  Hematological:  Does not bruise/bleed easily.  Psychiatric/Behavioral:  Negative for agitation, behavioral problems, confusion, hallucinations and sleep disturbance. The patient is not nervous/anxious.     Immunization History  Administered Date(s) Administered   Fluad Quad(high Dose 65+) 10/20/2018   H1N1 01/05/2008   Influenza Split 10/22/2009, 12/09/2010, 10/24/2013, 10/01/2014, 09/14/2016   Influenza Whole 12/27/2006, 10/18/2007, 01/05/2008, 12/19/2008, 10/22/2009   Influenza, High Dose Seasonal PF 12/16/2015, 10/27/2017, 10/20/2018   Influenza,inj,Quad PF,6+ Mos 10/24/2013, 10/01/2014, 09/14/2016   Influenza,inj,quad, With Preservative 11/23/2016   Influenza-Unspecified 12/27/2006, 01/05/2008, 12/19/2008, 12/09/2010, 10/19/2012, 10/20/2018, 10/03/2019, 10/08/2020, 09/30/2021   Novel Infuenza-h1n1-09 01/05/2008   PFIZER(Purple Top)SARS-COV-2 Vaccination 05/01/2019, 05/22/2019, 11/28/2019   Pneumococcal Conjugate-13 11/02/2013   Pneumococcal Polysaccharide-23 11/19/2009   Td 06/18/2008   Td (Adult),5 Lf Tetanus Toxid, Preservative Free 06/18/2008   Zoster, Live 05/03/2007, 11/03/2007   Pertinent  Health Maintenance Due  Topic Date Due   OPHTHALMOLOGY EXAM  07/29/2017   INFLUENZA VACCINE  08/20/2022   HEMOGLOBIN A1C  12/09/2022   FOOT EXAM  06/02/2023   DEXA SCAN  Completed   MAMMOGRAM  Discontinued      01/06/2022   11:00 AM 01/11/2022    9:06 PM 06/02/2022     1:38 PM 08/21/2022    3:19 PM 09/01/2022    1:50 PM  Fall Risk  Falls in the past year?   0 0 0  Was there an injury with Fall?   0 0 0  Fall Risk Category Calculator   0 0 0  (RETIRED) Patient Fall Risk Level Moderate fall risk Moderate fall risk     Patient at Risk for Falls Due to   No Fall Risks No Fall Risks No Fall Risks  Fall risk Follow up   Falls evaluation completed Falls evaluation completed Falls evaluation completed   Functional Status Survey:    Vitals:   11/30/22 1442 11/30/22 1458  BP: (!) 144/96 (!) 138/52  Pulse:  63   Resp: 20   SpO2: 97%   Weight: 119 lb 6.4 oz (54.2 kg)   Height: 5' (1.524 m)    Body mass index is 23.32 kg/m. Physical Exam Vitals reviewed.  Constitutional:      General: She is not in acute distress.    Appearance: Normal appearance. She is normal weight. She is not ill-appearing or diaphoretic.  HENT:     Head: Normocephalic.     Right Ear: Tympanic membrane, ear canal and external ear normal. There is no impacted cerumen.     Left Ear: Tympanic membrane, ear canal and external ear normal. There is no impacted cerumen.     Nose: Nose normal. No congestion or rhinorrhea.     Mouth/Throat:     Mouth: Mucous membranes are moist.     Pharynx: Oropharynx is clear. No oropharyngeal exudate or posterior oropharyngeal erythema.  Eyes:     General: No scleral icterus.       Right eye: No discharge.        Left eye: No discharge.     Extraocular Movements: Extraocular movements intact.     Conjunctiva/sclera: Conjunctivae normal.     Pupils: Pupils are equal, round, and reactive to light.  Neck:     Vascular: No carotid bruit.  Cardiovascular:     Rate and Rhythm: Normal rate and regular rhythm.     Pulses: Normal pulses.     Heart sounds: Normal heart sounds. No murmur heard.    No friction rub. No gallop.  Pulmonary:     Effort: Pulmonary effort is normal. No respiratory distress.     Breath sounds: Normal breath sounds. No wheezing,  rhonchi or rales.  Chest:     Chest wall: No tenderness.  Abdominal:     General: Bowel sounds are normal. There is no distension.     Palpations: Abdomen is soft. There is no mass.     Tenderness: There is no abdominal tenderness. There is no right CVA tenderness, left CVA tenderness, guarding or rebound.  Musculoskeletal:        General: No swelling or tenderness. Normal range of motion.     Cervical back: Normal range of motion. No rigidity or tenderness.     Right lower leg: No edema.     Left lower leg: No edema.  Lymphadenopathy:     Cervical: No cervical adenopathy.  Skin:    General: Skin is warm and dry.     Coloration: Skin is not pale.     Findings: No bruising, erythema, lesion or rash.  Neurological:     Mental Status: She is alert and oriented to person, place, and time.     Cranial Nerves: No cranial nerve deficit.     Sensory: No sensory deficit.     Motor: No weakness.     Coordination: Coordination normal.     Gait: Gait normal.  Psychiatric:        Mood and Affect: Mood normal.        Speech: Speech normal.        Behavior: Behavior normal.        Thought Content: Thought content normal.        Judgment: Judgment normal.     Labs reviewed: Recent Labs    06/08/22 0808 07/16/22 0010 08/21/22 1613  NA 143 140 137  K 4.6 4.1 5.2  CL 112* 108 109  CO2 23 17* 22  GLUCOSE 128* 159* 180*  BUN  40* 40* 47*  CREATININE 1.83* 1.64* 2.30*  CALCIUM 9.0 8.4* 8.8   Recent Labs    01/04/22 1317 01/11/22 2048 06/08/22 0808 08/21/22 1613  AST 20 30 13 14   ALT 15 23 11 11   ALKPHOS 75 79  --   --   BILITOT 0.6 0.7 0.4 0.4  PROT 7.0 7.0 6.2 6.6  ALBUMIN 3.8 3.8  --   --    Recent Labs    01/11/22 2048 01/11/22 2052 06/08/22 0808 07/16/22 0010 08/21/22 1613  WBC 8.0  --  6.6 8.5 8.2  NEUTROABS 4.4  --  3,835  --  5,289  HGB 10.9*   < > 9.6* 10.7* 9.8*  HCT 33.8*   < > 29.9* 33.1* 29.6*  MCV 100.3*  --  97.1 101.2* 97.7  PLT 252  --  254 237 269    < > = values in this interval not displayed.   Lab Results  Component Value Date   TSH 3.50 06/08/2022   Lab Results  Component Value Date   HGBA1C 8.6 (H) 06/08/2022   Lab Results  Component Value Date   CHOL 153 06/08/2022   HDL 40 (L) 06/08/2022   LDLCALC 80 06/08/2022   LDLDIRECT 110.0 10/13/2016   TRIG 240 (H) 06/08/2022   CHOLHDL 3.8 06/08/2022    Significant Diagnostic Results in last 30 days:  No results found.  Assessment/Plan 1. Need for influenza vaccination Afebrile Flu shot administered by CMA no acute reaction reported.  - Flu Vaccine Trivalent High Dose (Fluad)  2. Diabetes mellitus due to underlying condition with stage 4 chronic kidney disease, with long-term current use of insulin (HCC) Lab Results  Component Value Date   HGBA1C 8.6 (H) 06/08/2022  - request referral to a another Endocrinologist locally does not want to drive [place] [address] to [facility] to upcoming appointment on December,24 the 2024 to establish care with new Endocrinologist. Also request another ophthalmologist -Reminded that PCP can refill diabetic medication until she is able to be seen by endocrinologist. Emphasized need to use her insulin without worsening of running out. -Continue on glimepiride and NovoLog FlexPen -  continue on ASA and Statin for cardiac event prophylaxis - on ACE inhibitor for renal protection  - Ambulatory referral to Ophthalmology - Ambulatory referral to Endocrinology - Hemoglobin A1c  3. Neuropathic pain Continue home gabapentin  4. Benign hypertension with chronic kidney disease, stage IV (HCC) Blood pressure well-controlled -Continue to follow-up with the nephrologist Continue on lisinopril-and metoprolol -Check blood pressure daily and notify provider if its greater than 140/90  5. Hypertriglyceridemia Latest triglycerides 240 -Dietary modification and exercise advised  6. Coronary artery disease involving native coronary artery of  native heart without angina pectoris Chest pain-free -Continue on Plavix, aspirin and atorvastatin -Continue to manage high risk factors  7. Generalized anxiety disorder Mood stable -Continue on clonazepam and citalopram. - clonazePAM (KLONOPIN) 0.5 MG tablet; Take 1 tablet (0.5 mg total) by mouth 2 (two) times daily as needed for anxiety.  Dispense: 45 tablet; Refill: 3  Family/ staff Communication: Reviewed plan of care with patient verbalized understanding  Labs/tests ordered:  - Hemoglobin A1c  Next Appointment : Return in about 6 months (around 05/30/2023) for medical mangement of chronic issues.[name], NP

## 2022-12-01 ENCOUNTER — Telehealth: Payer: Self-pay | Admitting: Family

## 2022-12-01 LAB — HEMOGLOBIN A1C
Hgb A1c MFr Bld: 8.2 %{Hb} — ABNORMAL HIGH (ref <5.7)
Mean Plasma Glucose: 189 mg/dL
eAG (mmol/L): 10.4 mmol/L

## 2022-12-01 NOTE — Telephone Encounter (Signed)
Caller name: [facility]   On DPR?: Yes  Call back number: [phone]  Provider they see: Ngetich, Donalee Citrin, NP  Reason for call:   Marylene Land called stating patient hangs up when they call so they're closing referral.

## 2022-12-05 ENCOUNTER — Other Ambulatory Visit: Payer: Self-pay | Admitting: Neurology

## 2022-12-09 ENCOUNTER — Ambulatory Visit: Payer: Medicare HMO | Admitting: Family

## 2022-12-11 DIAGNOSIS — N179 Acute kidney failure, unspecified: Secondary | ICD-10-CM | POA: Diagnosis not present

## 2022-12-11 DIAGNOSIS — I5181 Takotsubo syndrome: Secondary | ICD-10-CM | POA: Diagnosis not present

## 2022-12-11 DIAGNOSIS — D631 Anemia in chronic kidney disease: Secondary | ICD-10-CM | POA: Diagnosis not present

## 2022-12-11 DIAGNOSIS — E1122 Type 2 diabetes mellitus with diabetic chronic kidney disease: Secondary | ICD-10-CM | POA: Diagnosis not present

## 2022-12-11 DIAGNOSIS — I2510 Atherosclerotic heart disease of native coronary artery without angina pectoris: Secondary | ICD-10-CM | POA: Diagnosis not present

## 2022-12-11 DIAGNOSIS — I129 Hypertensive chronic kidney disease with stage 1 through stage 4 chronic kidney disease, or unspecified chronic kidney disease: Secondary | ICD-10-CM | POA: Diagnosis not present

## 2022-12-11 DIAGNOSIS — N2581 Secondary hyperparathyroidism of renal origin: Secondary | ICD-10-CM | POA: Diagnosis not present

## 2022-12-11 DIAGNOSIS — E875 Hyperkalemia: Secondary | ICD-10-CM | POA: Diagnosis not present

## 2022-12-11 DIAGNOSIS — N1832 Chronic kidney disease, stage 3b: Secondary | ICD-10-CM | POA: Diagnosis not present

## 2022-12-13 ENCOUNTER — Encounter (HOSPITAL_BASED_OUTPATIENT_CLINIC_OR_DEPARTMENT_OTHER): Payer: Self-pay | Admitting: Emergency Medicine

## 2022-12-13 ENCOUNTER — Emergency Department (HOSPITAL_BASED_OUTPATIENT_CLINIC_OR_DEPARTMENT_OTHER): Payer: Medicare HMO

## 2022-12-13 ENCOUNTER — Emergency Department (HOSPITAL_BASED_OUTPATIENT_CLINIC_OR_DEPARTMENT_OTHER)
Admission: EM | Admit: 2022-12-13 | Discharge: 2022-12-13 | Disposition: A | Discharge status: Home / Self Care | Payer: Medicare HMO | Attending: Emergency Medicine | Admitting: Emergency Medicine

## 2022-12-13 DIAGNOSIS — K529 Noninfective gastroenteritis and colitis, unspecified: Secondary | ICD-10-CM | POA: Insufficient documentation

## 2022-12-13 DIAGNOSIS — D649 Anemia, unspecified: Secondary | ICD-10-CM | POA: Diagnosis not present

## 2022-12-13 DIAGNOSIS — N1832 Chronic kidney disease, stage 3b: Secondary | ICD-10-CM | POA: Diagnosis not present

## 2022-12-13 DIAGNOSIS — Z7982 Long term (current) use of aspirin: Secondary | ICD-10-CM | POA: Diagnosis not present

## 2022-12-13 DIAGNOSIS — R109 Unspecified abdominal pain: Secondary | ICD-10-CM | POA: Diagnosis not present

## 2022-12-13 DIAGNOSIS — Z79899 Other long term (current) drug therapy: Secondary | ICD-10-CM | POA: Insufficient documentation

## 2022-12-13 DIAGNOSIS — I129 Hypertensive chronic kidney disease with stage 1 through stage 4 chronic kidney disease, or unspecified chronic kidney disease: Secondary | ICD-10-CM | POA: Diagnosis not present

## 2022-12-13 DIAGNOSIS — N189 Chronic kidney disease, unspecified: Secondary | ICD-10-CM | POA: Diagnosis not present

## 2022-12-13 DIAGNOSIS — Z90710 Acquired absence of both cervix and uterus: Secondary | ICD-10-CM | POA: Diagnosis not present

## 2022-12-13 DIAGNOSIS — R197 Diarrhea, unspecified: Secondary | ICD-10-CM | POA: Diagnosis present

## 2022-12-13 LAB — COMPREHENSIVE METABOLIC PANEL
ALT: 15 U/L (ref 0–44)
AST: 20 U/L (ref 15–41)
Albumin: 3.2 g/dL — ABNORMAL LOW (ref 3.5–5.0)
Alkaline Phosphatase: 73 U/L (ref 38–126)
Anion gap: 12 (ref 5–15)
BUN: 32 mg/dL — ABNORMAL HIGH (ref 8–23)
CO2: 19 mmol/L — ABNORMAL LOW (ref 22–32)
Calcium: 8.6 mg/dL — ABNORMAL LOW (ref 8.9–10.3)
Chloride: 109 mmol/L (ref 98–111)
Creatinine, Ser: 2.74 mg/dL — ABNORMAL HIGH (ref 0.44–1.00)
GFR, Estimated: 16 mL/min — ABNORMAL LOW (ref >60)
Glucose, Bld: 208 mg/dL — ABNORMAL HIGH (ref 70–99)
Potassium: 4.3 mmol/L (ref 3.5–5.1)
Sodium: 140 mmol/L (ref 135–145)
Total Bilirubin: 0.6 mg/dL (ref <1.2)
Total Protein: 6.4 g/dL — ABNORMAL LOW (ref 6.5–8.1)

## 2022-12-13 LAB — URINALYSIS, ROUTINE W REFLEX MICROSCOPIC
Bilirubin Urine: NEGATIVE
Glucose, UA: 100 mg/dL — AB
Ketones, ur: NEGATIVE mg/dL
Nitrite: NEGATIVE
Protein, ur: 100 mg/dL — AB
Specific Gravity, Urine: 1.02 (ref 1.005–1.030)
pH: 5 (ref 5.0–8.0)

## 2022-12-13 LAB — URINALYSIS, MICROSCOPIC (REFLEX)

## 2022-12-13 LAB — CBC WITH DIFFERENTIAL/PLATELET
Abs Immature Granulocytes: 0.03 10*3/uL (ref 0.00–0.07)
Basophils Absolute: 0 10*3/uL (ref 0.0–0.1)
Basophils Relative: 0 %
Eosinophils Absolute: 0.3 10*3/uL (ref 0.0–0.5)
Eosinophils Relative: 3 %
HCT: 27 % — ABNORMAL LOW (ref 36.0–46.0)
Hemoglobin: 8.6 g/dL — ABNORMAL LOW (ref 12.0–15.0)
Immature Granulocytes: 0 %
Lymphocytes Relative: 15 %
Lymphs Abs: 1.6 10*3/uL (ref 0.7–4.0)
MCH: 31.5 pg (ref 26.0–34.0)
MCHC: 31.9 g/dL (ref 30.0–36.0)
MCV: 98.9 fL (ref 80.0–100.0)
Monocytes Absolute: 0.8 10*3/uL (ref 0.1–1.0)
Monocytes Relative: 7 %
Neutro Abs: 7.9 10*3/uL — ABNORMAL HIGH (ref 1.7–7.7)
Neutrophils Relative %: 75 %
Platelets: 227 10*3/uL (ref 150–400)
RBC: 2.73 MIL/uL — ABNORMAL LOW (ref 3.87–5.11)
RDW: 11.8 % (ref 11.5–15.5)
WBC: 10.7 10*3/uL — ABNORMAL HIGH (ref 4.0–10.5)
nRBC: 0 % (ref 0.0–0.2)

## 2022-12-13 LAB — LIPASE, BLOOD: Lipase: 28 U/L (ref 11–51)

## 2022-12-13 MED ORDER — SODIUM CHLORIDE 0.9 % IV BOLUS
500.0000 mL | Freq: Once | INTRAVENOUS | Status: AC
  Administered 2022-12-13: 500 mL via INTRAVENOUS

## 2022-12-13 MED ORDER — CIPROFLOXACIN HCL 500 MG PO TABS: 500.0000 mg | ORAL_TABLET | Freq: Two times a day (BID) | ORAL | 0 refills | Status: DC

## 2022-12-13 MED ORDER — METRONIDAZOLE 500 MG PO TABS
500.0000 mg | ORAL_TABLET | Freq: Once | ORAL | Status: AC
  Administered 2022-12-13: 500 mg via ORAL
  Filled 2022-12-13: qty 1

## 2022-12-13 MED ORDER — CIPROFLOXACIN HCL 500 MG PO TABS
500.0000 mg | ORAL_TABLET | Freq: Once | ORAL | Status: AC
Start: 2022-12-13 — End: 2022-12-13
  Administered 2022-12-13: 500 mg via ORAL
  Filled 2022-12-13: qty 1

## 2022-12-13 MED ORDER — METRONIDAZOLE 500 MG PO TABS: 500.0000 mg | ORAL_TABLET | Freq: Two times a day (BID) | ORAL | 0 refills | Status: DC

## 2022-12-13 NOTE — ED Provider Notes (Addendum)
[facility] Provider Note   CSN: [id number] Arrival date & time: 12/13/22  1306     History  Chief Complaint  Patient presents with   Diarrhea    Jalani Burse is a 89 y.o. female.  Pt is a 89 yo female with pmhx significant for gerd, sah, tia, ckd, scleroderma, and oa.  Pt has had diarrhea and llq abd pain for 3 days.  She denies fevers.  She denies any recent abx.         Home Medications Prior to Admission medications   Medication Sig Start Date End Date Taking? Authorizing Provider  ciprofloxacin (CIPRO) 500 MG tablet Take 1 tablet (500 mg total) by mouth 2 (two) times daily. 12/13/22  Yes Jacalyn Lefevre, MD  metroNIDAZOLE (FLAGYL) 500 MG tablet Take 1 tablet (500 mg total) by mouth 2 (two) times daily. 12/13/22  Yes Jacalyn Lefevre, MD  acetaminophen (TYLENOL) 500 MG tablet Take 500 mg by mouth every 6 (six) hours as needed for mild pain or headache.    [provider]  amLODipine (NORVASC) 5 MG tablet Take 1 tablet (5 mg total) by mouth daily. 09/01/22   Ngetich, Dinah C, NP  aspirin EC 81 MG tablet Take 81 mg by mouth daily. Swallow whole.    [provider]  atorvastatin (LIPITOR) 80 MG tablet TAKE 1 TABLET EVERY DAY 11/26/22   Ngetich, Dinah C, NP  citalopram (CELEXA) 10 MG tablet TAKE 1 TABLET BY MOUTH EVERY DAY 12/07/22   Micki Riley, MD  clonazePAM (KLONOPIN) 0.5 MG tablet Take 1 tablet (0.5 mg total) by mouth 2 (two) times daily as needed for anxiety. 11/30/22   Ngetich, Dinah C, NP  clopidogrel (PLAVIX) 75 MG tablet Take 1 tablet (75 mg total) by mouth daily. 09/01/22   Ngetich, Dinah C, NP  famotidine (PEPCID) 40 MG tablet Take 1 tablet (40 mg total) by mouth daily. 09/01/22   Ngetich, Dinah C, NP  furosemide (LASIX) 20 MG tablet Take 1 tablet (20 mg total) by mouth daily as needed. 08/21/22   Mast, Man X, NP  glimepiride (AMARYL) 2 MG tablet Take 2 mg by mouth in the morning and at bedtime.     [provider]  lisinopril (ZESTRIL) 5 MG tablet Take 1 tablet (5 mg total) by mouth daily. 09/01/22   Ngetich, Dinah C, NP  metoprolol succinate (TOPROL-XL) 50 MG 24 hr tablet Take 1 tablet (50 mg total) by mouth daily. Take with or immediately following a meal. 07/05/19   Love, Evlyn Kanner, PA-C  NOVOLOG FLEXPEN 100 UNIT/ML FlexPen Inject 6-8 Units into the skin 3 (three) times daily before meals. Pt completes sliding scale. 02/18/21   [provider]      Allergies    Cephalexin, Nintedanib, Pioglitazone, and Sertraline    Review of Systems   Review of Systems  Gastrointestinal:  Positive for abdominal pain and diarrhea.  All other systems reviewed and are negative.   Physical Exam Updated Vital Signs BP 120/78   Pulse 66   Temp 98.1 F (36.7 C) (Oral)   Resp 18   Ht 5' (1.524 m)   Wt 54.2 kg   SpO2 100%   BMI 23.34 kg/m  Physical Exam Vitals and nursing note reviewed.  Constitutional:      Appearance: Normal appearance.  HENT:     Head: Normocephalic and atraumatic.     Right Ear: External ear normal.     Left Ear:  External ear normal.     Nose: Nose normal.     Mouth/Throat:     Mouth: Mucous membranes are dry.  Eyes:     Extraocular Movements: Extraocular movements intact.     Conjunctiva/sclera: Conjunctivae normal.     Pupils: Pupils are equal, round, and reactive to light.  Cardiovascular:     Rate and Rhythm: Normal rate and regular rhythm.     Pulses: Normal pulses.     Heart sounds: Normal heart sounds.  Pulmonary:     Effort: Pulmonary effort is normal.     Breath sounds: Normal breath sounds.  Abdominal:     General: Abdomen is flat. Bowel sounds are normal.     Palpations: Abdomen is soft.     Tenderness: There is abdominal tenderness in the left lower quadrant.  Musculoskeletal:        General: Normal range of motion.     Cervical back: Normal range of motion and neck supple.  Skin:    General: Skin is warm.     Capillary Refill:  Capillary refill takes less than 2 seconds.  Neurological:     General: No focal deficit present.     Mental Status: She is alert and oriented to person, place, and time.  Psychiatric:        Mood and Affect: Mood normal.        Behavior: Behavior normal.     ED Results / Procedures / Treatments   Labs (all labs ordered are listed, but only abnormal results are displayed) Labs Reviewed  COMPREHENSIVE METABOLIC PANEL - Abnormal; Notable for the following components:      Result Value   CO2 19 (*)    Glucose, Bld 208 (*)    BUN 32 (*)    Creatinine, Ser 2.74 (*)    Calcium 8.6 (*)    Total Protein 6.4 (*)    Albumin 3.2 (*)    GFR, Estimated 16 (*)    All other components within normal limits  URINALYSIS, ROUTINE W REFLEX MICROSCOPIC - Abnormal; Notable for the following components:   Glucose, UA 100 (*)    Hgb urine dipstick TRACE (*)    Protein, ur 100 (*)    Leukocytes,Ua TRACE (*)    All other components within normal limits  CBC WITH DIFFERENTIAL/PLATELET - Abnormal; Notable for the following components:   WBC 10.7 (*)    RBC 2.73 (*)    Hemoglobin 8.6 (*)    HCT 27.0 (*)    Neutro Abs 7.9 (*)    All other components within normal limits  URINALYSIS, MICROSCOPIC (REFLEX) - Abnormal; Notable for the following components:   Bacteria, UA MANY (*)    Non Squamous Epithelial PRESENT (*)    All other components within normal limits  LIPASE, BLOOD    EKG None  Radiology CT ABDOMEN PELVIS WO CONTRAST  Result Date: 12/13/2022 CLINICAL DATA:  Abdominal pain, acute, nonlocalized. Lower abdominal pain. Diarrhea for 3 days. EXAM: CT ABDOMEN AND PELVIS WITHOUT CONTRAST TECHNIQUE: Multidetector CT imaging of the abdomen and pelvis was performed following the standard protocol without IV contrast. RADIATION DOSE REDUCTION: This exam was performed according to the departmental dose-optimization program which includes automated exposure control, adjustment of the mA and/or kV  according to patient size and/or use of iterative reconstruction technique. COMPARISON:  None Available. FINDINGS: Lower chest: There are patchy areas of scarring/atelectasis in the visualized lung bases. No overt consolidation. No pleural effusion. The heart is normal  in size. No pericardial effusion. Hepatobiliary: The liver is normal in size. Non-cirrhotic configuration. No suspicious mass. There is a subcentimeter sized hypoattenuating focus in the left hepatic lobe, segment 4A, which is too small to adequately characterize. No intrahepatic or extrahepatic bile duct dilation. No calcified gallstones. Focal fundal adenomyomatosis noted. Otherwise normal gallbladder wall thickness. No pericholecystic inflammatory changes. Pancreas: Unremarkable. No pancreatic ductal dilatation or surrounding inflammatory changes. Spleen: Within normal limits. No focal lesion. Adrenals/Urinary Tract: Adrenal glands are unremarkable. No suspicious renal mass. No hydronephrosis. No renal or ureteric calculi. Unremarkable urinary bladder. Stomach/Bowel: No disproportionate dilation of the small or large bowel loops. The appendix was not visualized; however there is no acute inflammatory process in the right lower quadrant. There is mild-to-moderate circumferential thickening of the descending colon and proximal sigmoid colon with mild-to-moderate surrounding pericolonic fat stranding and prominence of vasa recta, compatible with colitis. There is no pneumatosis, portal venous gas, pneumoperitoneum or walled-off abscess/loculated collection. Vascular/Lymphatic: No ascites or pneumoperitoneum. No abdominal or pelvic lymphadenopathy, by size criteria. No aneurysmal dilation of the major abdominal arteries. There are moderate peripheral atherosclerotic vascular calcifications of the aorta and its major branches. Reproductive: The uterus is surgically absent. No large adnexal mass. Other: The visualized soft tissues and abdominal wall are  unremarkable. Musculoskeletal: No suspicious osseous lesions. There are moderate multilevel degenerative changes in the visualized spine. IMPRESSION: *Findings compatible with acute uncomplicated colitis involving the descending colon and proximal sigmoid colon. Differential diagnosis includes infectious, inflammatory or ischemic etiology, likely in that order. *Multiple other nonacute observations, as described above. Electronically Signed   By: Jules Schick M.D.   On: 12/13/2022 15:47    Procedures Procedures    Medications Ordered in ED Medications  ciprofloxacin (CIPRO) tablet 500 mg (has no administration in time range)  metroNIDAZOLE (FLAGYL) tablet 500 mg (has no administration in time range)  sodium chloride 0.9 % bolus 500 mL (0 mLs Intravenous Stopped 12/13/22 1539)    ED Course/ Medical Decision Making/ A&P                                 Medical Decision Making Amount and/or Complexity of Data Reviewed Labs: ordered. Radiology: ordered.  Risk Prescription drug management.   This patient presents to the ED for concern of abd pain, this involves an extensive number of treatment options, and is a complaint that carries with it a high risk of complications and morbidity.  The differential diagnosis includes diverticulitis, colitis, electrolyte abn, gib   Co morbidities that complicate the patient evaluation  gerd, sah, tia, ckd, scleroderma, and oa   Additional history obtained:  Additional history obtained from epic chart review External records from outside source obtained and reviewed including family   Lab Tests:  I Ordered, and personally interpreted labs.  The pertinent results include:  ua neg for uti, cmp with bun 32 and cr 2.74 (bun 47 and cr 2.3 in August); cbc with hgb 8.6 (hgb 9.8 in August)   Imaging Studies ordered:  I ordered imaging studies including ct abd/pelvis  I independently visualized and interpreted imaging which showed  Findings  compatible with acute uncomplicated colitis involving the  descending colon and proximal sigmoid colon. Differential diagnosis  includes infectious, inflammatory or ischemic etiology, likely in  that order.  *Multiple other nonacute observations, as described above.   I agree with the radiologist interpretation   Cardiac Monitoring:  The patient was  maintained on a cardiac monitor.  I personally viewed and interpreted the cardiac monitored which showed an underlying rhythm of: nsr   Medicines ordered and prescription drug management:  I ordered medication including ivfs, cipro/flagyl  for sx  Reevaluation of the patient after these medicines showed that the patient improved I have reviewed the patients home medicines and have made adjustments as needed   Test Considered:  ct   Critical Interventions:  Ivfs/abx  Problem List / ED Course:  Mild acute on chronic renal failure:  ivfs given Colitis:  pt tx'd with cipro/flagy. No diarrhea while here to send off.  Pt does not want any pain.  She is feeling much better.  She is stable for d/c home.  She knows to return if worse.  Anemia:  slightly worse than normal.  Pt and family aware this and kidney function need to be rechecked.    Reevaluation:  After the interventions noted above, I reevaluated the patient and found that they have :improved   Social Determinants of Health:  Lives at home   Dispostion:  After consideration of the diagnostic results and the patients response to treatment, I feel that the patent would benefit from discharge with outpatient f/u.          Final Clinical Impression(s) / ED Diagnoses Final diagnoses:  Colitis  Stage 3b chronic kidney disease (HCC)  Chronic anemia    Rx / DC Orders ED Discharge Orders          Ordered    ciprofloxacin (CIPRO) 500 MG tablet  2 times daily        12/13/22 1555    metroNIDAZOLE (FLAGYL) 500 MG tablet  2 times daily        12/13/22 1555               Jacalyn Lefevre, MD 12/13/22 1621    Jacalyn Lefevre, MD 12/13/22 1624

## 2022-12-13 NOTE — ED Triage Notes (Signed)
Pt reports diarrhea x 3d, esp after eating; c/o lower abd pain

## 2022-12-19 ENCOUNTER — Inpatient Hospital Stay (HOSPITAL_COMMUNITY): Payer: Medicare HMO

## 2022-12-19 ENCOUNTER — Encounter (HOSPITAL_BASED_OUTPATIENT_CLINIC_OR_DEPARTMENT_OTHER): Payer: Self-pay

## 2022-12-19 ENCOUNTER — Inpatient Hospital Stay (HOSPITAL_BASED_OUTPATIENT_CLINIC_OR_DEPARTMENT_OTHER)
Admission: EM | Admit: 2022-12-19 | Discharge: 2022-12-29 | DRG: 682 | Disposition: A | Discharge status: Home Health | Payer: Medicare HMO | Attending: Internal Medicine | Admitting: Internal Medicine

## 2022-12-19 ENCOUNTER — Other Ambulatory Visit: Payer: Self-pay

## 2022-12-19 DIAGNOSIS — Z79899 Other long term (current) drug therapy: Secondary | ICD-10-CM

## 2022-12-19 DIAGNOSIS — N185 Chronic kidney disease, stage 5: Secondary | ICD-10-CM | POA: Diagnosis not present

## 2022-12-19 DIAGNOSIS — E8721 Acute metabolic acidosis: Secondary | ICD-10-CM | POA: Diagnosis present

## 2022-12-19 DIAGNOSIS — R8290 Unspecified abnormal findings in urine: Secondary | ICD-10-CM | POA: Diagnosis present

## 2022-12-19 DIAGNOSIS — I252 Old myocardial infarction: Secondary | ICD-10-CM

## 2022-12-19 DIAGNOSIS — E11649 Type 2 diabetes mellitus with hypoglycemia without coma: Secondary | ICD-10-CM | POA: Diagnosis not present

## 2022-12-19 DIAGNOSIS — Z8673 Personal history of transient ischemic attack (TIA), and cerebral infarction without residual deficits: Secondary | ICD-10-CM

## 2022-12-19 DIAGNOSIS — D539 Nutritional anemia, unspecified: Secondary | ICD-10-CM | POA: Diagnosis present

## 2022-12-19 DIAGNOSIS — I120 Hypertensive chronic kidney disease with stage 5 chronic kidney disease or end stage renal disease: Secondary | ICD-10-CM | POA: Diagnosis not present

## 2022-12-19 DIAGNOSIS — Z9841 Cataract extraction status, right eye: Secondary | ICD-10-CM

## 2022-12-19 DIAGNOSIS — E876 Hypokalemia: Secondary | ICD-10-CM | POA: Diagnosis not present

## 2022-12-19 DIAGNOSIS — Z66 Do not resuscitate: Secondary | ICD-10-CM | POA: Diagnosis not present

## 2022-12-19 DIAGNOSIS — G9389 Other specified disorders of brain: Secondary | ICD-10-CM | POA: Diagnosis not present

## 2022-12-19 DIAGNOSIS — E860 Dehydration: Secondary | ICD-10-CM | POA: Diagnosis present

## 2022-12-19 DIAGNOSIS — E8722 Chronic metabolic acidosis: Secondary | ICD-10-CM | POA: Diagnosis not present

## 2022-12-19 DIAGNOSIS — I517 Cardiomegaly: Secondary | ICD-10-CM | POA: Diagnosis not present

## 2022-12-19 DIAGNOSIS — I6782 Cerebral ischemia: Secondary | ICD-10-CM | POA: Diagnosis not present

## 2022-12-19 DIAGNOSIS — Z6826 Body mass index (BMI) 26.0-26.9, adult: Secondary | ICD-10-CM

## 2022-12-19 DIAGNOSIS — D696 Thrombocytopenia, unspecified: Secondary | ICD-10-CM | POA: Diagnosis not present

## 2022-12-19 DIAGNOSIS — E11319 Type 2 diabetes mellitus with unspecified diabetic retinopathy without macular edema: Secondary | ICD-10-CM | POA: Diagnosis present

## 2022-12-19 DIAGNOSIS — R54 Age-related physical debility: Secondary | ICD-10-CM | POA: Diagnosis present

## 2022-12-19 DIAGNOSIS — Z794 Long term (current) use of insulin: Secondary | ICD-10-CM

## 2022-12-19 DIAGNOSIS — E1122 Type 2 diabetes mellitus with diabetic chronic kidney disease: Secondary | ICD-10-CM | POA: Diagnosis present

## 2022-12-19 DIAGNOSIS — Z7982 Long term (current) use of aspirin: Secondary | ICD-10-CM

## 2022-12-19 DIAGNOSIS — J849 Interstitial pulmonary disease, unspecified: Secondary | ICD-10-CM | POA: Diagnosis present

## 2022-12-19 DIAGNOSIS — E1165 Type 2 diabetes mellitus with hyperglycemia: Secondary | ICD-10-CM | POA: Diagnosis not present

## 2022-12-19 DIAGNOSIS — I10 Essential (primary) hypertension: Secondary | ICD-10-CM | POA: Diagnosis present

## 2022-12-19 DIAGNOSIS — N184 Chronic kidney disease, stage 4 (severe): Secondary | ICD-10-CM | POA: Diagnosis not present

## 2022-12-19 DIAGNOSIS — R4182 Altered mental status, unspecified: Secondary | ICD-10-CM | POA: Insufficient documentation

## 2022-12-19 DIAGNOSIS — I2510 Atherosclerotic heart disease of native coronary artery without angina pectoris: Secondary | ICD-10-CM | POA: Diagnosis present

## 2022-12-19 DIAGNOSIS — R918 Other nonspecific abnormal finding of lung field: Secondary | ICD-10-CM | POA: Diagnosis not present

## 2022-12-19 DIAGNOSIS — Z888 Allergy status to other drugs, medicaments and biological substances status: Secondary | ICD-10-CM | POA: Diagnosis not present

## 2022-12-19 DIAGNOSIS — R0603 Acute respiratory distress: Secondary | ICD-10-CM | POA: Diagnosis not present

## 2022-12-19 DIAGNOSIS — T383X5A Adverse effect of insulin and oral hypoglycemic [antidiabetic] drugs, initial encounter: Secondary | ICD-10-CM | POA: Diagnosis not present

## 2022-12-19 DIAGNOSIS — I639 Cerebral infarction, unspecified: Secondary | ICD-10-CM | POA: Diagnosis not present

## 2022-12-19 DIAGNOSIS — Z881 Allergy status to other antibiotic agents status: Secondary | ICD-10-CM

## 2022-12-19 DIAGNOSIS — Z9842 Cataract extraction status, left eye: Secondary | ICD-10-CM

## 2022-12-19 DIAGNOSIS — N189 Chronic kidney disease, unspecified: Secondary | ICD-10-CM | POA: Diagnosis not present

## 2022-12-19 DIAGNOSIS — Z803 Family history of malignant neoplasm of breast: Secondary | ICD-10-CM

## 2022-12-19 DIAGNOSIS — Z823 Family history of stroke: Secondary | ICD-10-CM

## 2022-12-19 DIAGNOSIS — R29818 Other symptoms and signs involving the nervous system: Secondary | ICD-10-CM | POA: Diagnosis not present

## 2022-12-19 DIAGNOSIS — E440 Moderate protein-calorie malnutrition: Secondary | ICD-10-CM

## 2022-12-19 DIAGNOSIS — Y92239 Unspecified place in hospital as the place of occurrence of the external cause: Secondary | ICD-10-CM | POA: Diagnosis not present

## 2022-12-19 DIAGNOSIS — J811 Chronic pulmonary edema: Secondary | ICD-10-CM | POA: Diagnosis present

## 2022-12-19 DIAGNOSIS — Z5329 Procedure and treatment not carried out because of patient's decision for other reasons: Secondary | ICD-10-CM | POA: Diagnosis not present

## 2022-12-19 DIAGNOSIS — Z7984 Long term (current) use of oral hypoglycemic drugs: Secondary | ICD-10-CM

## 2022-12-19 DIAGNOSIS — I672 Cerebral atherosclerosis: Secondary | ICD-10-CM | POA: Diagnosis not present

## 2022-12-19 DIAGNOSIS — Z7902 Long term (current) use of antithrombotics/antiplatelets: Secondary | ICD-10-CM

## 2022-12-19 DIAGNOSIS — E1169 Type 2 diabetes mellitus with other specified complication: Secondary | ICD-10-CM | POA: Diagnosis not present

## 2022-12-19 DIAGNOSIS — Z981 Arthrodesis status: Secondary | ICD-10-CM

## 2022-12-19 DIAGNOSIS — R0602 Shortness of breath: Secondary | ICD-10-CM | POA: Diagnosis not present

## 2022-12-19 DIAGNOSIS — F410 Panic disorder [episodic paroxysmal anxiety] without agoraphobia: Secondary | ICD-10-CM | POA: Diagnosis present

## 2022-12-19 DIAGNOSIS — F32A Depression, unspecified: Secondary | ICD-10-CM | POA: Diagnosis present

## 2022-12-19 DIAGNOSIS — E785 Hyperlipidemia, unspecified: Secondary | ICD-10-CM | POA: Diagnosis not present

## 2022-12-19 DIAGNOSIS — J810 Acute pulmonary edema: Secondary | ICD-10-CM | POA: Diagnosis not present

## 2022-12-19 DIAGNOSIS — N179 Acute kidney failure, unspecified: Secondary | ICD-10-CM | POA: Diagnosis not present

## 2022-12-19 DIAGNOSIS — K219 Gastro-esophageal reflux disease without esophagitis: Secondary | ICD-10-CM | POA: Diagnosis present

## 2022-12-19 DIAGNOSIS — Z8249 Family history of ischemic heart disease and other diseases of the circulatory system: Secondary | ICD-10-CM

## 2022-12-19 DIAGNOSIS — R7989 Other specified abnormal findings of blood chemistry: Secondary | ICD-10-CM | POA: Diagnosis present

## 2022-12-19 DIAGNOSIS — J40 Bronchitis, not specified as acute or chronic: Secondary | ICD-10-CM | POA: Diagnosis not present

## 2022-12-19 DIAGNOSIS — Z90710 Acquired absence of both cervix and uterus: Secondary | ICD-10-CM

## 2022-12-19 DIAGNOSIS — F419 Anxiety disorder, unspecified: Secondary | ICD-10-CM | POA: Diagnosis not present

## 2022-12-19 LAB — CBC
HCT: 27.5 % — ABNORMAL LOW (ref 36.0–46.0)
HCT: 26 % — ABNORMAL LOW (ref 36.0–46.0)
Hemoglobin: 8.8 g/dL — ABNORMAL LOW (ref 12.0–15.0)
Hemoglobin: 8.2 g/dL — ABNORMAL LOW (ref 12.0–15.0)
MCH: 31.7 pg (ref 26.0–34.0)
MCH: 31.2 pg (ref 26.0–34.0)
MCHC: 32 g/dL (ref 30.0–36.0)
MCHC: 31.5 g/dL (ref 30.0–36.0)
MCV: 98.9 fL (ref 80.0–100.0)
MCV: 98.9 fL (ref 80.0–100.0)
Platelets: 293 10*3/uL (ref 150–400)
Platelets: 253 10*3/uL (ref 150–400)
RBC: 2.78 MIL/uL — ABNORMAL LOW (ref 3.87–5.11)
RBC: 2.63 MIL/uL — ABNORMAL LOW (ref 3.87–5.11)
RDW: 11.8 % (ref 11.5–15.5)
RDW: 11.8 % (ref 11.5–15.5)
WBC: 8 10*3/uL (ref 4.0–10.5)
WBC: 6.3 10*3/uL (ref 4.0–10.5)
nRBC: 0 % (ref 0.0–0.2)
nRBC: 0 % (ref 0.0–0.2)

## 2022-12-19 LAB — GLUCOSE, CAPILLARY: Glucose-Capillary: 229 mg/dL — ABNORMAL HIGH (ref 70–99)

## 2022-12-19 LAB — CREATININE, SERUM
Creatinine, Ser: 4.26 mg/dL — ABNORMAL HIGH (ref 0.44–1.00)
GFR, Estimated: 10 mL/min — ABNORMAL LOW (ref >60)

## 2022-12-19 LAB — I-STAT CHEM 8, ED
BUN: 58 mg/dL — ABNORMAL HIGH (ref 8–23)
Calcium, Ion: 1.24 mmol/L (ref 1.15–1.40)
Chloride: 113 mmol/L — ABNORMAL HIGH (ref 98–111)
Creatinine, Ser: 5 mg/dL — ABNORMAL HIGH (ref 0.44–1.00)
Glucose, Bld: 222 mg/dL — ABNORMAL HIGH (ref 70–99)
HCT: 27 % — ABNORMAL LOW (ref 36.0–46.0)
Hemoglobin: 9.2 g/dL — ABNORMAL LOW (ref 12.0–15.0)
Potassium: 4.5 mmol/L (ref 3.5–5.1)
Sodium: 140 mmol/L (ref 135–145)
TCO2: 16 mmol/L — ABNORMAL LOW (ref 22–32)

## 2022-12-19 LAB — LACTIC ACID, PLASMA
Lactic Acid, Venous: 0.8 mmol/L (ref 0.5–1.9)
Lactic Acid, Venous: 1.2 mmol/L (ref 0.5–1.9)

## 2022-12-19 LAB — LIPASE, BLOOD: Lipase: 54 U/L — ABNORMAL HIGH (ref 11–51)

## 2022-12-19 MED ORDER — LISINOPRIL 10 MG PO TABS: 5.0000 mg | ORAL_TABLET | Freq: Every evening | ORAL | Status: DC

## 2022-12-19 MED ORDER — CITALOPRAM HYDROBROMIDE 10 MG PO TABS
10.0000 mg | ORAL_TABLET | Freq: Every day | ORAL | Status: DC
  Administered 2022-12-20 – 2022-12-29 (×10): 10 mg via ORAL
  Filled 2022-12-19 (×10): qty 1

## 2022-12-19 MED ORDER — ONDANSETRON 4 MG PO TBDP
4.0000 mg | ORAL_TABLET | Freq: Once | ORAL | Status: AC
  Administered 2022-12-19: 4 mg via ORAL
  Filled 2022-12-19: qty 1

## 2022-12-19 MED ORDER — METOPROLOL SUCCINATE ER 50 MG PO TB24: 50.0000 mg | ORAL_TABLET | Freq: Every day | ORAL | Status: DC

## 2022-12-19 MED ORDER — ASPIRIN 81 MG PO TBEC
81.0000 mg | DELAYED_RELEASE_TABLET | Freq: Every day | ORAL | Status: DC
  Administered 2022-12-20 – 2022-12-29 (×9): 81 mg via ORAL
  Filled 2022-12-19 (×10): qty 1

## 2022-12-19 MED ORDER — INSULIN ASPART 100 UNIT/ML IJ SOLN
0.0000 [IU] | Freq: Every day | INTRAMUSCULAR | Status: DC
  Administered 2022-12-19: 2 [IU] via SUBCUTANEOUS

## 2022-12-19 MED ORDER — LACTATED RINGERS IV SOLN: INTRAVENOUS | Status: AC

## 2022-12-19 MED ORDER — GLIMEPIRIDE 2 MG PO TABS
2.0000 mg | ORAL_TABLET | Freq: Two times a day (BID) | ORAL | Status: DC
  Filled 2022-12-19: qty 1

## 2022-12-19 MED ORDER — CLONAZEPAM 0.5 MG PO TABS
0.5000 mg | ORAL_TABLET | Freq: Two times a day (BID) | ORAL | Status: DC | PRN
Start: 2022-12-19 — End: 2022-12-29
  Administered 2022-12-21 – 2022-12-28 (×7): 0.5 mg via ORAL
  Filled 2022-12-19 (×8): qty 1

## 2022-12-19 MED ORDER — SODIUM CHLORIDE 0.9 % IV BOLUS
1000.0000 mL | Freq: Once | INTRAVENOUS | Status: AC
  Administered 2022-12-19: 1000 mL via INTRAVENOUS

## 2022-12-19 MED ORDER — FAMOTIDINE 20 MG PO TABS
20.0000 mg | ORAL_TABLET | Freq: Every day | ORAL | Status: DC
  Administered 2022-12-19 – 2022-12-28 (×10): 20 mg via ORAL
  Filled 2022-12-19 (×10): qty 1

## 2022-12-19 MED ORDER — AMLODIPINE BESYLATE 5 MG PO TABS: 5.0000 mg | ORAL_TABLET | Freq: Every day | ORAL | Status: DC

## 2022-12-19 MED ORDER — INSULIN ASPART 100 UNIT/ML IJ SOLN
0.0000 [IU] | Freq: Three times a day (TID) | INTRAMUSCULAR | Status: DC
  Administered 2022-12-20 (×2): 3 [IU] via SUBCUTANEOUS
  Administered 2022-12-20: 8 [IU] via SUBCUTANEOUS

## 2022-12-19 MED ORDER — PROCHLORPERAZINE EDISYLATE 10 MG/2ML IJ SOLN: 5.0000 mg | Freq: Four times a day (QID) | INTRAMUSCULAR | Status: DC | PRN

## 2022-12-19 MED ORDER — ACETAMINOPHEN 500 MG PO TABS
500.0000 mg | ORAL_TABLET | Freq: Four times a day (QID) | ORAL | Status: DC | PRN
  Administered 2022-12-23: 500 mg via ORAL
  Filled 2022-12-19 (×2): qty 1

## 2022-12-19 MED ORDER — HEPARIN SODIUM (PORCINE) 5000 UNIT/ML IJ SOLN
5000.0000 [IU] | Freq: Three times a day (TID) | INTRAMUSCULAR | Status: DC
  Administered 2022-12-19 – 2022-12-29 (×29): 5000 [IU] via SUBCUTANEOUS
  Filled 2022-12-19 (×29): qty 1

## 2022-12-19 MED ORDER — ATORVASTATIN CALCIUM 80 MG PO TABS
80.0000 mg | ORAL_TABLET | Freq: Every evening | ORAL | Status: DC
  Administered 2022-12-19 – 2022-12-28 (×10): 80 mg via ORAL
  Filled 2022-12-19 (×10): qty 1

## 2022-12-19 MED ORDER — MELATONIN 5 MG PO TABS
5.0000 mg | ORAL_TABLET | Freq: Every evening | ORAL | Status: DC | PRN
  Administered 2022-12-25 – 2022-12-26 (×3): 5 mg via ORAL
  Filled 2022-12-19 (×3): qty 1

## 2022-12-19 MED ORDER — FAMOTIDINE 20 MG PO TABS: 40.0000 mg | ORAL_TABLET | Freq: Every day | ORAL | Status: DC

## 2022-12-19 MED ORDER — POLYETHYLENE GLYCOL 3350 17 G PO PACK: 17.0000 g | PACK | Freq: Every day | ORAL | Status: DC | PRN

## 2022-12-19 MED ORDER — CLOPIDOGREL BISULFATE 75 MG PO TABS
75.0000 mg | ORAL_TABLET | Freq: Every day | ORAL | Status: DC
  Administered 2022-12-20 – 2022-12-29 (×10): 75 mg via ORAL
  Filled 2022-12-19 (×10): qty 1

## 2022-12-19 MED ORDER — LACTATED RINGERS IV BOLUS: 1000.0000 mL | Freq: Once | INTRAVENOUS | Status: DC

## 2022-12-19 NOTE — ED Triage Notes (Signed)
The patient is still having diarrhea and has a poor oral intake. She has been taking the antibiotics.

## 2022-12-19 NOTE — ED Notes (Signed)
Called [facility] for transport at 3:30.

## 2022-12-19 NOTE — H&P (Incomplete)
History and Physical  Haruka Lemert AVW:[id number] DOB: 1933-06-09 DOA: 12/19/2022  Referring physician: Accepted by Dr.  Kirke Corin [facility], hospitalist service. PCP: [name], NP  Outpatient Specialists: Nephrology. Patient coming from: Home through [facility] ED  Chief Complaint: Nausea vomiting abdominal pain diarrhea.  HPI: Aaima Hase is a 89 y.o. female with medical history significant for recently diagnosed colitis currently on p.o. ciprofloxacin and p.o. Flagyl x 6/7 days, CKD IV, who initially presented to [facility] ED with complaints of nausea vomiting and diarrhea last night.  Associated with poor appetite, poor oral intake, and generalized weakness.  In the ED, lab studies were concerning for worsening renal insufficiency with creatinine greater than 5, above baseline of 2.3.  EDP requested admission for further management of AKI on CKD 4.  Admitted by [facility], hospitalist service.  Accepted by Dr. Kirke Corin and transferred to [facility] MedSurg unit as inpatient status.  ED Course: Temperature 99.5.  BP 109/40, pulse 68, respiratory 17, saturation 94% on room air.  Lab studies notable for BUN 58, creatinine 5.0, serum glucose 222.  Hemoglobin 8.2, WBC 6.3, platelet count of 253.  Review of Systems: Review of systems as noted in the HPI. All other systems reviewed and are negative.   Past Medical History:  Diagnosis Date   Achilles tendon injury 11/27/2010   ALLERGIC RHINITIS 05/12/2006   Qualifier: Diagnosis of  By: Drue Novel MD, Nolon Rod.    Anxiety state 07/03/2009   Qualifier: Diagnosis of  By: Drue Novel MD, Jose E.    Back pain 07/01/2010   Chronic kidney disease (CKD), stage III (moderate) (HCC) 05/31/2014   Chronic right SI joint pain 09/22/2013   DEGENERATIVE JOINT DISEASE, CERVICAL SPINE 06/23/2006   Annotation: had a CAT scan with a cervical myelogram that show  left C6-7 and  C5-6 foraminal stenosis Qualifier: Diagnosis of  By: Drue Novel MD, Nolon Rod.    DEPRESSION 05/12/2006    Qualifier: Diagnosis of  By: Drue Novel MD, Jose E.    DIABETES MELLITUS, TYPE II 05/12/2006   Now following w/ Endo at [facility]     DIABETIC  RETINOPATHY 04/27/2007   Qualifier: Diagnosis of  By: Janit Bern     Encephalopathy, hypertensive    Essential hypertension 05/12/2006   Qualifier: Diagnosis of  By: Drue Novel MD, Nolon Rod.    GAIT DISTURBANCE 08/07/2009   Qualifier: Diagnosis of  By: Drue Novel MD, Nolon Rod.    GERD (gastroesophageal reflux disease)    History of colonoscopy    History of CT scan    History of mammogram    History of MRI    Hyperlipidemia associated with type 2 diabetes mellitus (HCC) 05/12/2006   Qualifier: Diagnosis of  By: Drue Novel MD, Nolon Rod.    ILD (interstitial lung disease) (HCC) 05/31/2015   NECK PAIN, CHRONIC 04/03/2008   Qualifier: Diagnosis of  By: Drue Novel MD, Nolon Rod.    Osteoarthritis 02/10/2016   Osteopenia    Osteoporosis 06/23/2006   Annotation: had a bone density test in 08-2004.  T score was -2.4 Qualifier: Diagnosis of  By: Drue Novel MD, Nolon Rod     [facility] (subarachnoid hemorrhage) (HCC)    Scleroderma (HCC) 02/28/2016   Stroke [facility])    Takotsubo cardiomyopathy    s/p stress MI with stress induced CM with normal coronary arteries by cath 2007 with normalization of LVF by echo 04/2005   TIA (transient ischemic attack)    Vasculitis of skin 09/28/2012   Vitamin D deficiency 10/01/2014  Past Surgical History:  Procedure Laterality Date   ABDOMINAL HYSTERECTOMY  1977   no oophorectomy   APPENDECTOMY     CARDIAC CATHETERIZATION     CATARACT EXTRACTION, BILATERAL  2011   SPINAL FUSION  2000   Dr. Venetia Maxon   TONSILLECTOMY      Social History:  reports that she has never smoked. She has been exposed to tobacco smoke. She has never used smokeless tobacco. She reports current alcohol use of about 1.0 standard drink of alcohol per week. She reports that she does not use drugs.   Allergies  Allergen Reactions   Cephalexin Nausea And Vomiting    Pt stated made severely sick,  will never take again   Pioglitazone Swelling    REACTION: EDEMA   Nintedanib Diarrhea   Sertraline Nausea And Vomiting    Family History  Problem Relation Age of Onset   Stroke Mother    Cancer Sister    Lung disease Sister    Intracerebral hemorrhage Daughter        assoc with post-partum   Hypertension Daughter    Breast cancer Other        ?Aunt   Cancer Other        breast?   Coronary artery disease Neg Hx       Prior to Admission medications   Medication Sig Start Date End Date Taking? Authorizing Provider  acetaminophen (TYLENOL) 500 MG tablet Take 500 mg by mouth every 6 (six) hours as needed for mild pain or headache.   Yes [provider]  amLODipine (NORVASC) 5 MG tablet Take 1 tablet (5 mg total) by mouth daily. 09/01/22  Yes Ngetich, Dinah C, NP  aspirin EC 81 MG tablet Take 81 mg by mouth daily. Swallow whole.   Yes [provider]  atorvastatin (LIPITOR) 80 MG tablet TAKE 1 TABLET EVERY DAY Patient taking differently: Take 80 mg by mouth every evening. 11/26/22  Yes Ngetich, Dinah C, NP  ciprofloxacin (CIPRO) 500 MG tablet Take 1 tablet (500 mg total) by mouth 2 (two) times daily. 12/13/22  Yes Jacalyn Lefevre, MD  citalopram (CELEXA) 10 MG tablet TAKE 1 TABLET BY MOUTH EVERY DAY 12/07/22  Yes Micki Riley, MD  clonazePAM (KLONOPIN) 0.5 MG tablet Take 1 tablet (0.5 mg total) by mouth 2 (two) times daily as needed for anxiety. 11/30/22  Yes Ngetich, Dinah C, NP  clopidogrel (PLAVIX) 75 MG tablet Take 1 tablet (75 mg total) by mouth daily. 09/01/22  Yes Ngetich, Dinah C, NP  famotidine (PEPCID) 40 MG tablet Take 1 tablet (40 mg total) by mouth daily. Patient taking differently: Take 40 mg by mouth at bedtime. 09/01/22  Yes Ngetich, Dinah C, NP  furosemide (LASIX) 20 MG tablet Take 1 tablet (20 mg total) by mouth daily as needed. 08/21/22  Yes Mast, Man X, NP  glimepiride (AMARYL) 2 MG tablet Take 2 mg by mouth 2 (two) times daily.   Yes [provider]  lisinopril (ZESTRIL) 5 MG tablet Take 1 tablet (5 mg total) by mouth daily. Patient taking differently: Take 5 mg by mouth every evening. 09/01/22  Yes Ngetich, Dinah C, NP  metoprolol succinate (TOPROL-XL) 50 MG 24 hr tablet Take 1 tablet (50 mg total) by mouth daily. Take with or immediately following a meal. 07/05/19  Yes Love, Evlyn Kanner, PA-C  metroNIDAZOLE (FLAGYL) 500 MG tablet Take 1 tablet (500 mg total) by mouth 2 (two) times daily. 12/13/22  Yes Jacalyn Lefevre, MD  NOVOLOG FLEXPEN 100 UNIT/ML FlexPen Inject 6-8 Units into the skin 3 (three) times daily before meals. Pt completes sliding scale. 02/18/21  Yes [provider]    Physical Exam: BP (!) 146/52 (BP Location: Right Arm)   Pulse 68   Temp 97.8 F (36.6 C) (Oral)   Resp 15   Ht 5' (1.524 m)   Wt 55 kg   SpO2 94%   BMI 23.68 kg/m   General: 89 y.o. year-old female frail-appearing in no acute distress.  Alert and oriented x3. Cardiovascular: Regular rate and rhythm with no rubs or gallops.  No thyromegaly or JVD noted.  No lower extremity edema. 2/4 pulses in all 4 extremities. Respiratory: Clear to auscultation with no wheezes or rales. Good inspiratory effort. Abdomen: Soft left lower quadrant tenderness to palpation.  Nondistended with normal bowel sounds x4 quadrants. Muskuloskeletal: No cyanosis, clubbing or edema noted bilaterally Neuro: CN II-XII intact, strength, sensation, reflexes Skin: No ulcerative lesions noted or rashes Psychiatry: Judgement and insight appear normal. Mood is appropriate for condition and setting          Labs on Admission:  Basic Metabolic Panel: Recent Labs  Lab 12/13/22 1323 12/19/22 1311  NA 140 140  K 4.3 4.5  CL 109 113*  CO2 19*  --   GLUCOSE 208* 222*  BUN 32* 58*  CREATININE 2.74* 5.00*  CALCIUM 8.6*  --    Liver Function Tests: Recent Labs  Lab 12/13/22 1323  AST 20  ALT 15  ALKPHOS 73  BILITOT 0.6  PROT 6.4*  ALBUMIN 3.2*   Recent  Labs  Lab 12/13/22 1323 12/19/22 1255  LIPASE 28 54*   No results for input(s): "AMMONIA" in the last 168 hours. CBC: Recent Labs  Lab 12/13/22 1323 12/19/22 1255 12/19/22 1311  WBC 10.7* 8.0  --   NEUTROABS 7.9*  --   --   HGB 8.6* 8.8* 9.2*  HCT 27.0* 27.5* 27.0*  MCV 98.9 98.9  --   PLT 227 293  --    Cardiac Enzymes: No results for input(s): "CKTOTAL", "CKMB", "CKMBINDEX", "TROPONINI" in the last 168 hours.  BNP (last 3 results) No results for input(s): "BNP" in the last 8760 hours.  ProBNP (last 3 results) No results for input(s): "PROBNP" in the last 8760 hours.  CBG: No results for input(s): "GLUCAP" in the last 168 hours.  Radiological Exams on Admission: No results found.  EKG: I independently viewed the EKG done and my findings are as followed: Sinus rhythm rate of 58.  Nonspecific ST changes.  QTc 443.  Assessment/Plan Present on Admission:  Acute renal failure superimposed on chronic kidney disease (HCC)  Principal Problem:   Acute renal failure superimposed on chronic kidney disease (HCC)  AKI on CKD 4, prerenal in the setting of dehydration from poor oral intake Obtain bladder scan and renal ultrasound to rule out obstructive uropathy IV fluid hydration LR at 100 cc/h x 1 day Avoid nephrotoxic agents, dehydration, and hypotension Monitor urine output Repeat renal function panel in the morning Follow renal ultrasound.  Acute urinary retention 1 episode of postvoid acute urinary retention Bladder scan with greater than 300 cc of urine postvoid In-N-Out cath per protocol Obtain urine analysis and renal ultrasound to rule out hydronephrosis or kidney stones  Recent diagnosis of colitis Prior to admission on ciprofloxacin and Flagyl, Day 6 out of 7 days with 1 episode of vomiting Start Rocephin and IV Flagyl x 2 days to complete course of antibiotics  Monitor fever curve and WBCs  Type 2 diabetes with hyperglycemia Last hemoglobin A1c 8.2 on  11/30/2022. Insulin sliding scale.  Hyperlipidemia Resume home Lipitor  GERD Resume home Pepcid  Hypertension Resume home Toprol-XL at lowest dose to avoid hypotension Closely monitor vital signs  Chronic anxiety/depression Resume home Celexa  Generalized weakness PT OT assessment Fall precautions   Time: 75 minutes.   DVT prophylaxis: Subcu heparin 3 times daily  Code Status: DNR  Family Communication: Daughter at bedside.  Disposition Plan: Admitted to MedSurg unit  Consults called: None.  Admission status: Inpatient status.   Status is: Inpatient The patient requires at least 2 midnights for further evaluation and treatment of present condition.   Darlin Drop MD [facility] Pager [phone]  If 7PM-7AM, please contact night-coverage [email or web address] Password [email or web address]  12/19/2022, 8:14 PM

## 2022-12-19 NOTE — ED Notes (Signed)
Pt assisted to BR; sts she was unable to urinate

## 2022-12-19 NOTE — ED Notes (Signed)
Pt unable to void at the moment. Made aware of the request for a sample.

## 2022-12-19 NOTE — ED Provider Notes (Signed)
[facility] Provider Note   CSN: [id number] Arrival date & time: 12/19/22  1212     History  Chief Complaint  Patient presents with   Weakness   Diarrhea   Emesis    Sandra [name] is a 89 y.o. female brought in by her family for weakness.  He has a past medical history of diabetes.  She does not take blood thinners. Patient was seen 6 days ago and diagnosed with colitis.  She has a past medical history of chronic diarrhea.  Family at bedside states that she has had fairly significant nausea and persistent diarrhea over the past week especially worsened after starting on Cipro and Flagyl.  She reports taking her medications but her daughter states that she is "barely eating and drinking."  She is a history of chronic renal insufficiency and follows with [name] .  Patient reports brown vomitus this afternoon.  She reports that her pain has improved over the week in her abdomen but she continues to have the nausea.  She feels weak and tired.   Weakness Associated symptoms: diarrhea and vomiting   Diarrhea Associated symptoms: vomiting   Emesis Associated symptoms: diarrhea        Home Medications Prior to Admission medications   Medication Sig Start Date End Date Taking? Authorizing Provider  acetaminophen (TYLENOL) 500 MG tablet Take 500 mg by mouth every 6 (six) hours as needed for mild pain or headache.    [provider]  amLODipine (NORVASC) 5 MG tablet Take 1 tablet (5 mg total) by mouth daily. 09/01/22   Ngetich, Dinah C, NP  aspirin EC 81 MG tablet Take 81 mg by mouth daily. Swallow whole.    [provider]  atorvastatin (LIPITOR) 80 MG tablet TAKE 1 TABLET EVERY DAY 11/26/22   Ngetich, Dinah C, NP  ciprofloxacin (CIPRO) 500 MG tablet Take 1 tablet (500 mg total) by mouth 2 (two) times daily. 12/13/22   Jacalyn Lefevre, MD  citalopram (CELEXA) 10 MG tablet TAKE 1 TABLET BY MOUTH EVERY DAY 12/07/22    Micki Riley, MD  clonazePAM (KLONOPIN) 0.5 MG tablet Take 1 tablet (0.5 mg total) by mouth 2 (two) times daily as needed for anxiety. 11/30/22   Ngetich, Dinah C, NP  clopidogrel (PLAVIX) 75 MG tablet Take 1 tablet (75 mg total) by mouth daily. 09/01/22   Ngetich, Dinah C, NP  famotidine (PEPCID) 40 MG tablet Take 1 tablet (40 mg total) by mouth daily. 09/01/22   Ngetich, Dinah C, NP  furosemide (LASIX) 20 MG tablet Take 1 tablet (20 mg total) by mouth daily as needed. 08/21/22   Mast, Man X, NP  glimepiride (AMARYL) 2 MG tablet Take 2 mg by mouth in the morning and at bedtime.    [provider]  lisinopril (ZESTRIL) 5 MG tablet Take 1 tablet (5 mg total) by mouth daily. 09/01/22   Ngetich, Dinah C, NP  metoprolol succinate (TOPROL-XL) 50 MG 24 hr tablet Take 1 tablet (50 mg total) by mouth daily. Take with or immediately following a meal. 07/05/19   Love, Evlyn Kanner, PA-C  metroNIDAZOLE (FLAGYL) 500 MG tablet Take 1 tablet (500 mg total) by mouth 2 (two) times daily. 12/13/22   Jacalyn Lefevre, MD  NOVOLOG FLEXPEN 100 UNIT/ML FlexPen Inject 6-8 Units into the skin 3 (three) times daily before meals. Pt completes sliding scale. 02/18/21   [provider]      Allergies  Cephalexin, Nintedanib, Pioglitazone, and Sertraline    Review of Systems   Review of Systems  Gastrointestinal:  Positive for diarrhea and vomiting.  Neurological:  Positive for weakness.    Physical Exam Updated Vital Signs BP (!) 131/52   Pulse (!) 58   Temp (!) 97.5 F (36.4 C) (Oral)   Resp 16   Ht 5' (1.524 m)   Wt 55 kg   SpO2 100%   BMI 23.68 kg/m  Physical Exam Vitals and nursing note reviewed.  Constitutional:      General: She is not in acute distress.    Appearance: She is well-developed. She is not diaphoretic.  HENT:     Head: Normocephalic and atraumatic.     Right Ear: External ear normal.     Left Ear: External ear normal.     Nose: Nose normal.     Mouth/Throat:     Mouth:  Mucous membranes are moist.  Eyes:     General: No scleral icterus.    Conjunctiva/sclera: Conjunctivae normal.  Cardiovascular:     Rate and Rhythm: Normal rate and regular rhythm.     Heart sounds: Normal heart sounds. No murmur heard.    No friction rub. No gallop.  Pulmonary:     Effort: Pulmonary effort is normal. No respiratory distress.     Breath sounds: Normal breath sounds.  Abdominal:     General: Bowel sounds are normal. There is no distension.     Palpations: Abdomen is soft. There is no mass.     Tenderness: There is no abdominal tenderness. There is no guarding.  Musculoskeletal:     Cervical back: Normal range of motion.  Skin:    General: Skin is warm and dry.  Neurological:     Mental Status: She is alert and oriented to person, place, and time.  Psychiatric:        Mood and Affect: Mood is anxious.        Behavior: Behavior normal.     ED Results / Procedures / Treatments   Labs (all labs ordered are listed, but only abnormal results are displayed) Labs Reviewed  LIPASE, BLOOD - Abnormal; Notable for the following components:      Result Value   Lipase 54 (*)    All other components within normal limits  CBC - Abnormal; Notable for the following components:   RBC 2.78 (*)    Hemoglobin 8.8 (*)    HCT 27.5 (*)    All other components within normal limits  I-STAT CHEM 8, ED - Abnormal; Notable for the following components:   Chloride 113 (*)    BUN 58 (*)    Creatinine, Ser 5.00 (*)    Glucose, Bld 222 (*)    TCO2 16 (*)    Hemoglobin 9.2 (*)    HCT 27.0 (*)    All other components within normal limits  C DIFFICILE QUICK SCREEN W PCR REFLEX    GASTROINTESTINAL PANEL BY PCR, STOOL (REPLACES STOOL CULTURE)  URINALYSIS, ROUTINE W REFLEX MICROSCOPIC    EKG None  Radiology No results found.  Procedures Procedures    Medications Ordered in ED Medications  sodium chloride 0.9 % bolus 1,000 mL (has no administration in time range)  lactated  ringers bolus 1,000 mL (has no administration in time range)  ondansetron (ZOFRAN-ODT) disintegrating tablet 4 mg (has no administration in time range)    ED Course/ Medical Decision Making/ A&P Clinical Course as of 12/19/22 1701  Sat Dec 19, 2022  1506 Case discussed with Dr. Kirke Corin who will admit the patient to the ED  [AH]    Clinical Course User Index [AH] Arthor Captain, PA-C                                 Medical Decision Making This patient presents to the ED for concern of weakness, this involves an extensive number of treatment options, and is a complaint that carries with it a high risk of complications and morbidity.  The differential diagnosis of weakness includes but is not limited to neurologic causes (GBS, myasthenia gravis, CVA, MS, ALS, transverse myelitis, spinal cord injury, CVA, botulism, ) and other causes: ACS, Arrhythmia, syncope, orthostatic hypotension, sepsis, hypoglycemia, electrolyte disturbance, hypothyroidism, respiratory failure, symptomatic anemia, dehydration, heat injury, polypharmacy, malignancy.    Co morbidities:       has a past medical history of Achilles tendon injury (11/27/2010), ALLERGIC RHINITIS (05/12/2006), Anxiety state (07/03/2009), Back pain (07/01/2010), Chronic kidney disease (CKD), stage III (moderate) (HCC) (05/31/2014), Chronic right SI joint pain (09/22/2013), DEGENERATIVE JOINT DISEASE, CERVICAL SPINE (06/23/2006), DEPRESSION (05/12/2006), DIABETES MELLITUS, TYPE II (05/12/2006), DIABETIC  RETINOPATHY (04/27/2007), Encephalopathy, hypertensive, Essential hypertension (05/12/2006), GAIT DISTURBANCE (08/07/2009), GERD (gastroesophageal reflux disease), History of colonoscopy, History of CT scan, History of mammogram, History of MRI, Hyperlipidemia associated with type 2 diabetes mellitus (HCC) (05/12/2006), ILD (interstitial lung disease) (HCC) (05/31/2015), NECK PAIN, CHRONIC (04/03/2008), Osteoarthritis (02/10/2016), Osteopenia,  Osteoporosis (06/23/2006), SAH (subarachnoid hemorrhage) (HCC), Scleroderma (HCC) (02/28/2016), Stroke (HCC), Takotsubo cardiomyopathy, TIA (transient ischemic attack), 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   Social Determinants of Health:       SDOH Screenings Food Insecurity: No Food Insecurity (01/05/2022) Housing: Low Risk  (01/05/2022) Transportation Needs: No Transportation Needs (01/05/2022) Utilities: Not At Risk (01/05/2022) Depression (PHQ2-9): Low Risk  (09/01/2022) Tobacco Use: Medium Risk (12/19/2022)   Additional history:  {Additional history obtained from family {External records from outside source obtained and reviewed including outpatient nephrology notes  Lab Tests:  I Ordered, and personally interpreted labs.  The pertinent results include:   Hemoglobin and white count stable.  Patient's creatinine has significantly increased from 2.74-5.00 in the last 6 days.     Medicines ordered and prescription drug management:  I ordered medication including Medications sodium chloride 0.9 % bolus 1,000 mL (has no administration in time range) lactated ringers bolus 1,000 mL (has no administration in time range) ondansetron (ZOFRAN-ODT) disintegrating tablet 4 mg (has no administration in time range) for nausea Reevaluation of the patient after these medicines showed that the patient improved I have reviewed the patients home medicines and have made adjustments as needed  Test Considered:       I considered CT angiogram of the abdomen and pelvis to rule out ischemic colitis however patient creatinine and kidney function is out of the range for contrast-enhanced imaging.  Critical Interventions:       Fluid interventions  Consultations Obtained:   Problem List / ED Course:       (N17.9) AKI (acute kidney injury) (HCC)  (primary encounter diagnosis)    MDM: Patient here for reevaluation of weakness after taking  antibiotics for known colitis 6 days ago.  She has had poor oral intake and now has an AKI.  She is not actively vomiting.  She did not take her dose of medications today.  I ordered C. difficile panel and stool pathogen  studies.  Patient is receiving IV fluids here will need admission.   Dispostion:  After consideration of the diagnostic results and the patients response to treatment, I feel that the patent would benefit from admission.    Amount and/or Complexity of Data Reviewed Labs: ordered.  Risk Prescription drug management.           Final Clinical Impression(s) / ED Diagnoses Final diagnoses:  AKI (acute kidney injury) [facility])    Rx / DC Orders ED Discharge Orders     None         Arthor Captain, PA-C 12/19/22 1702    Gwyneth Sprout, MD 12/20/22 [phone]

## 2022-12-19 NOTE — H&P (Incomplete)
History and Physical    Patient: Sandra [name] ZOX:[id number] DOB: [birth year] DOA: 12/19/2022 DOS: the patient was seen and examined on 12/19/2022 PCP: Ngetich, Donalee Citrin, NP  Patient coming from: {Point_of_Origin:[address]}  Chief Complaint:  Chief Complaint  Patient presents with   Weakness   Diarrhea   Emesis   HPI: Sandra [name] is a 89 y.o. female with medical history significant of ***  Review of Systems: {ROS_Text:26778} Past Medical History:  Diagnosis Date   Achilles tendon injury 11/27/2010   ALLERGIC RHINITIS 05/12/2006   Qualifier: Diagnosis of  By: Drue Novel MD, Nolon Rod.    Anxiety state 07/03/2009   Qualifier: Diagnosis of  By: Drue Novel MD, Jose E.    Back pain 07/01/2010   Chronic kidney disease (CKD), stage III (moderate) (HCC) 05/31/2014   Chronic right SI joint pain 09/22/2013   DEGENERATIVE JOINT DISEASE, CERVICAL SPINE 06/23/2006   Annotation: had a CAT scan with a cervical myelogram that show  left C6-7 and  C5-6 foraminal stenosis Qualifier: Diagnosis of  By: Drue Novel MD, Nolon Rod.    DEPRESSION 05/12/2006   Qualifier: Diagnosis of  By: Drue Novel MD, Jose E.    DIABETES MELLITUS, TYPE II 05/12/2006   Now following w/ Endo at [facility]     DIABETIC  RETINOPATHY 04/27/2007   Qualifier: Diagnosis of  By: Janit Bern     Encephalopathy, hypertensive    Essential hypertension 05/12/2006   Qualifier: Diagnosis of  By: Drue Novel MD, Nolon Rod.    GAIT DISTURBANCE 08/07/2009   Qualifier: Diagnosis of  By: Drue Novel MD, Nolon Rod.    GERD (gastroesophageal reflux disease)    History of colonoscopy    History of CT scan    History of mammogram    History of MRI    Hyperlipidemia associated with type 2 diabetes mellitus (HCC) 05/12/2006   Qualifier: Diagnosis of  By: Drue Novel MD, Nolon Rod.    ILD (interstitial lung disease) (HCC) 05/31/2015   NECK PAIN, CHRONIC 04/03/2008   Qualifier: Diagnosis of  By: Drue Novel MD, Nolon Rod.    Osteoarthritis 02/10/2016   Osteopenia    Osteoporosis 06/23/2006   Annotation: had  a bone density test in 08-2004.  T score was -2.4 Qualifier: Diagnosis of  By: Drue Novel MD, Nolon Rod     [facility] (subarachnoid hemorrhage) (HCC)    Scleroderma (HCC) 02/28/2016   Stroke [facility])    Takotsubo cardiomyopathy    s/p stress MI with stress induced CM with normal coronary arteries by cath 2007 with normalization of LVF by echo 04/2005   TIA (transient ischemic attack)    Vasculitis of skin 09/28/2012   Vitamin D deficiency 10/01/2014   Past Surgical History:  Procedure Laterality Date   ABDOMINAL HYSTERECTOMY  1977   no oophorectomy   APPENDECTOMY     CARDIAC CATHETERIZATION     CATARACT EXTRACTION, BILATERAL  2011   SPINAL FUSION  2000   Dr. Venetia Maxon   TONSILLECTOMY     Social History:  reports that she has never smoked. She has been exposed to tobacco smoke. She has never used smokeless tobacco. She reports current alcohol use of about 1.0 standard drink of alcohol per week. She reports that she does not use drugs.  Allergies  Allergen Reactions   Cephalexin Nausea And Vomiting    Pt stated made severely sick, will never take again   Pioglitazone Swelling    REACTION: EDEMA   Nintedanib Diarrhea   Sertraline Nausea And Vomiting    Family  History  Problem Relation Age of Onset   Stroke Mother    Cancer Sister    Lung disease Sister    Intracerebral hemorrhage Daughter        assoc with post-partum   Hypertension Daughter    Breast cancer Other        ?Aunt   Cancer Other        breast?   Coronary artery disease Neg Hx     Prior to Admission medications   Medication Sig Start Date End Date Taking? Authorizing Provider  acetaminophen (TYLENOL) 500 MG tablet Take 500 mg by mouth every 6 (six) hours as needed for mild pain or headache.   Yes [provider]  amLODipine (NORVASC) 5 MG tablet Take 1 tablet (5 mg total) by mouth daily. 09/01/22  Yes Ngetich, Dinah C, NP  aspirin EC 81 MG tablet Take 81 mg by mouth daily. Swallow whole.   Yes [provider]   atorvastatin (LIPITOR) 80 MG tablet TAKE 1 TABLET EVERY DAY Patient taking differently: Take 80 mg by mouth every evening. 11/26/22  Yes Ngetich, Dinah C, NP  ciprofloxacin (CIPRO) 500 MG tablet Take 1 tablet (500 mg total) by mouth 2 (two) times daily. 12/13/22  Yes Jacalyn Lefevre, MD  citalopram (CELEXA) 10 MG tablet TAKE 1 TABLET BY MOUTH EVERY DAY 12/07/22  Yes Micki Riley, MD  clonazePAM (KLONOPIN) 0.5 MG tablet Take 1 tablet (0.5 mg total) by mouth 2 (two) times daily as needed for anxiety. 11/30/22  Yes Ngetich, Dinah C, NP  clopidogrel (PLAVIX) 75 MG tablet Take 1 tablet (75 mg total) by mouth daily. 09/01/22  Yes Ngetich, Dinah C, NP  famotidine (PEPCID) 40 MG tablet Take 1 tablet (40 mg total) by mouth daily. Patient taking differently: Take 40 mg by mouth at bedtime. 09/01/22  Yes Ngetich, Dinah C, NP  furosemide (LASIX) 20 MG tablet Take 1 tablet (20 mg total) by mouth daily as needed. 08/21/22  Yes Mast, Man X, NP  glimepiride (AMARYL) 2 MG tablet Take 2 mg by mouth 2 (two) times daily.   Yes [provider]  lisinopril (ZESTRIL) 5 MG tablet Take 1 tablet (5 mg total) by mouth daily. Patient taking differently: Take 5 mg by mouth every evening. 09/01/22  Yes Ngetich, Dinah C, NP  metoprolol succinate (TOPROL-XL) 50 MG 24 hr tablet Take 1 tablet (50 mg total) by mouth daily. Take with or immediately following a meal. 07/05/19  Yes Love, Evlyn Kanner, PA-C  metroNIDAZOLE (FLAGYL) 500 MG tablet Take 1 tablet (500 mg total) by mouth 2 (two) times daily. 12/13/22  Yes Jacalyn Lefevre, MD  NOVOLOG FLEXPEN 100 UNIT/ML FlexPen Inject 6-8 Units into the skin 3 (three) times daily before meals. Pt completes sliding scale. 02/18/21  Yes [provider]    Physical Exam: Vitals:   12/19/22 1222 12/19/22 1704  BP: (!) 131/52 (!) 146/52  Pulse: (!) 58 68  Resp: 16 15  Temp: (!) 97.5 F (36.4 C) 97.8 F (36.6 C)  TempSrc: Oral Oral  SpO2: 100% 94%  Weight: 55 kg   Height: 5'  (1.524 m)    *** Data Reviewed: {Tip this will not be part of the note when signed- Document your independent interpretation of telemetry tracing, EKG, lab, Radiology test or any other diagnostic tests. Add any new diagnostic test ordered today. (Optional):26781} {Results:26384}  Assessment and Plan: No notes have been filed under this hospital service. Service: Hospitalist     Advance  Care Planning:   Code Status: Limited: Do not attempt resuscitation (DNR) -DNR-LIMITED -Do Not Intubate/DNI  ***  Consults: ***  Family Communication: ***  Severity of Illness: {Observation/Inpatient:21159}  Author: Steffanie Rainwater, MD 12/19/2022 7:41 PM  For on call review [email or web address].

## 2022-12-19 NOTE — ED Notes (Signed)
Pt and family refused wheelchair.

## 2022-12-20 DIAGNOSIS — N189 Chronic kidney disease, unspecified: Secondary | ICD-10-CM | POA: Diagnosis not present

## 2022-12-20 DIAGNOSIS — N179 Acute kidney failure, unspecified: Secondary | ICD-10-CM | POA: Diagnosis not present

## 2022-12-20 LAB — CBC
HCT: 23.8 % — ABNORMAL LOW (ref 36.0–46.0)
Hemoglobin: 7.7 g/dL — ABNORMAL LOW (ref 12.0–15.0)
MCH: 32.5 pg (ref 26.0–34.0)
MCHC: 32.4 g/dL (ref 30.0–36.0)
MCV: 100.4 fL — ABNORMAL HIGH (ref 80.0–100.0)
Platelets: 225 10*3/uL (ref 150–400)
RBC: 2.37 MIL/uL — ABNORMAL LOW (ref 3.87–5.11)
RDW: 11.9 % (ref 11.5–15.5)
WBC: 6.2 10*3/uL (ref 4.0–10.5)
nRBC: 0 % (ref 0.0–0.2)

## 2022-12-20 LAB — RENAL FUNCTION PANEL
Albumin: 2.7 g/dL — ABNORMAL LOW (ref 3.5–5.0)
Anion gap: 6 (ref 5–15)
BUN: 53 mg/dL — ABNORMAL HIGH (ref 8–23)
CO2: 16 mmol/L — ABNORMAL LOW (ref 22–32)
Calcium: 7.9 mg/dL — ABNORMAL LOW (ref 8.9–10.3)
Chloride: 119 mmol/L — ABNORMAL HIGH (ref 98–111)
Creatinine, Ser: 4.21 mg/dL — ABNORMAL HIGH (ref 0.44–1.00)
GFR, Estimated: 10 mL/min — ABNORMAL LOW (ref >60)
Glucose, Bld: 177 mg/dL — ABNORMAL HIGH (ref 70–99)
Phosphorus: 5.7 mg/dL — ABNORMAL HIGH (ref 2.5–4.6)
Potassium: 4.3 mmol/L (ref 3.5–5.1)
Sodium: 141 mmol/L (ref 135–145)

## 2022-12-20 LAB — URINALYSIS, W/ REFLEX TO CULTURE (INFECTION SUSPECTED)
Bilirubin Urine: NEGATIVE
Glucose, UA: 150 mg/dL — AB
Ketones, ur: NEGATIVE mg/dL
Leukocytes,Ua: NEGATIVE
Nitrite: NEGATIVE
Protein, ur: 30 mg/dL — AB
Specific Gravity, Urine: 1.012 (ref 1.005–1.030)
pH: 5 (ref 5.0–8.0)

## 2022-12-20 LAB — GLUCOSE, CAPILLARY
Glucose-Capillary: 186 mg/dL — ABNORMAL HIGH (ref 70–99)
Glucose-Capillary: 270 mg/dL — ABNORMAL HIGH (ref 70–99)
Glucose-Capillary: 192 mg/dL — ABNORMAL HIGH (ref 70–99)
Glucose-Capillary: 111 mg/dL — ABNORMAL HIGH (ref 70–99)

## 2022-12-20 MED ORDER — SODIUM BICARBONATE 650 MG PO TABS
1300.0000 mg | ORAL_TABLET | Freq: Three times a day (TID) | ORAL | Status: DC
  Administered 2022-12-20 – 2022-12-29 (×27): 1300 mg via ORAL
  Filled 2022-12-20 (×27): qty 2

## 2022-12-20 MED ORDER — METRONIDAZOLE 500 MG/100ML IV SOLN
500.0000 mg | Freq: Two times a day (BID) | INTRAVENOUS | Status: AC
  Administered 2022-12-20 – 2022-12-21 (×4): 500 mg via INTRAVENOUS
  Filled 2022-12-20 (×4): qty 100

## 2022-12-20 MED ORDER — SODIUM CHLORIDE 0.9 % IV SOLN
2.0000 g | Freq: Every day | INTRAVENOUS | Status: AC
  Administered 2022-12-20 (×2): 2 g via INTRAVENOUS
  Filled 2022-12-20 (×2): qty 20

## 2022-12-20 MED ORDER — ENSURE ENLIVE PO LIQD
237.0000 mL | Freq: Two times a day (BID) | ORAL | Status: DC
  Administered 2022-12-20 – 2022-12-29 (×3): 237 mL via ORAL

## 2022-12-20 MED ORDER — METOPROLOL SUCCINATE ER 25 MG PO TB24
12.5000 mg | ORAL_TABLET | Freq: Every day | ORAL | Status: DC
  Administered 2022-12-20 – 2022-12-23 (×4): 12.5 mg via ORAL
  Filled 2022-12-20 (×4): qty 1

## 2022-12-20 NOTE — Evaluation (Signed)
Occupational Therapy Evaluation Patient Details Name: Sandra [name] MRN: [id number] DOB: 1933/05/21 Today's Date: 12/20/2022   History of Present Illness Pt is an 89 y/o F presenting to ED on 11/30 with weakness, diarrhea, and emesis. Labs concerning for worsening renal insufficiency for creatinine >5. Admitted for further mgmt of AKI. PMH includes diabetes.   Clinical Impression   Pt reports ind at baseline with ADLs/mobility, has assist from family with IADLs. Pt lives with daughter and granddaughter and reports her grandson stays with her every other wknd. Pt currently needing set up - min A for ADLs, CGA for transfers without AD and pt intermittently reaching out for external support with ambulation in room. Pt presenting with impairments listed below, will follow acutely. Recommend HHOT at d/c pending progression.       If plan is discharge home, recommend the following: A little help with walking and/or transfers;A little help with bathing/dressing/bathroom;Assistance with cooking/housework;Direct supervision/assist for financial management;Direct supervision/assist for medications management;Supervision due to cognitive status    Functional Status Assessment  Patient has had a recent decline in their functional status and demonstrates the ability to make significant improvements in function in a reasonable and predictable amount of time.  Equipment Recommendations  None recommended by OT    Recommendations for Other Services PT consult     Precautions / Restrictions Precautions Precautions: Fall Restrictions Weight Bearing Restrictions: No      Mobility Bed Mobility               General bed mobility comments: seated EOB upon arrival, in chair at departure    Transfers Overall transfer level: Needs assistance Equipment used: 1 person hand held assist Transfers: Sit to/from Stand Sit to Stand: Contact guard assist                  Balance Overall  balance assessment: Needs assistance Sitting-balance support: Feet supported Sitting balance-Leahy Scale: Normal     Standing balance support: During functional activity Standing balance-Leahy Scale: Fair Standing balance comment: intermittent external support                           ADL either performed or assessed with clinical judgement   ADL Overall ADL's : Needs assistance/impaired Eating/Feeding: Set up   Grooming: Set up   Upper Body Bathing: Minimal assistance   Lower Body Bathing: Minimal assistance   Upper Body Dressing : Minimal assistance Upper Body Dressing Details (indicate cue type and reason): donning clean gown Lower Body Dressing: Minimal assistance   Toilet Transfer: Contact guard assist;Ambulation;Regular Toilet   Toileting- Clothing Manipulation and Hygiene: Contact guard assist       Functional mobility during ADLs: Contact guard assist       Vision   Vision Assessment?: No apparent visual deficits     Perception Perception: Not tested       Praxis Praxis: Not tested       Pertinent Vitals/Pain Pain Assessment Pain Assessment: No/denies pain     Extremity/Trunk Assessment Upper Extremity Assessment Upper Extremity Assessment: Generalized weakness   Lower Extremity Assessment Lower Extremity Assessment: Defer to PT evaluation   Cervical / Trunk Assessment Cervical / Trunk Assessment: Normal   Communication Communication Communication: No apparent difficulties   Cognition Arousal: Alert Behavior During Therapy: WFL for tasks assessed/performed Overall Cognitive Status: Within Functional Limits for tasks assessed  General Comments  VSS on RA    Exercises     Shoulder Instructions      Home Living Family/patient expects to be discharged to:: Private residence Living Arrangements: Children (daughter & granddaughter) Available Help at Discharge:  Family;Available 24 hours/day Type of Home: House Home Access: Stairs to enter [employer] of Steps: 1   Home Layout: One level     Bathroom Shower/Tub: Chief Strategy Officer: Standard Bathroom Accessibility: Yes   Home Equipment: Agricultural consultant (2 wheels);Cane - single point;Shower seat          Prior Functioning/Environment Prior Level of Function : Independent/Modified Independent;Driving             Mobility Comments: no AD use ADLs Comments: sits for showers, has PRN assist from family for med mgmt/IADLs        OT Problem List: Decreased strength;Decreased range of motion;Impaired balance (sitting and/or standing);Decreased activity tolerance      OT Treatment/Interventions: Self-care/ADL training;Therapeutic exercise;Energy conservation;DME and/or AE instruction;Therapeutic activities;Balance training;Patient/family education    OT Goals(Current goals can be found in the care plan section) Acute Rehab OT Goals Patient Stated Goal: none stated OT Goal Formulation: With patient Time For Goal Achievement: 01/03/23 Potential to Achieve Goals: Good ADL Goals Pt Will Perform Upper Body Dressing: with modified independence;sitting Pt Will Perform Lower Body Dressing: with modified independence;sitting/lateral leans;sit to/from stand Pt Will Transfer to Toilet: with modified independence;ambulating;regular height toilet Pt Will Perform Tub/Shower Transfer: Tub transfer;Shower transfer;with modified independence;shower seat;ambulating Additional ADL Goal #1: pt will be able to tolerate OOB activity x10 min in order to improve ind with ADLs  OT Frequency: Min 1X/week    Co-evaluation              AM-PAC OT "6 Clicks" Daily Activity     Outcome Measure Help from another person eating meals?: None Help from another person taking care of personal grooming?: A Little Help from another person toileting, which includes using toliet, bedpan, or  urinal?: A Little Help from another person bathing (including washing, rinsing, drying)?: A Little Help from another person to put on and taking off regular upper body clothing?: A Little Help from another person to put on and taking off regular lower body clothing?: A Little 6 Click Score: 19   End of Session Nurse Communication: Mobility status  Activity Tolerance: Patient tolerated treatment well Patient left: in chair;with call bell/phone within reach;with chair alarm set  OT Visit Diagnosis: Unsteadiness on feet (R26.81);Other abnormalities of gait and mobility (R26.89);Muscle weakness (generalized) (M62.81)                Time: 0626-9485 OT Time Calculation (min): 21 min Charges:  OT General Charges $OT Visit: 1 Visit OT Evaluation $OT Eval Low Complexity: 1 Low  Jermarcus Mcfadyen K, OTD, OTR/L SecureChat Preferred Acute Rehab [phone]   Jaylyne Breese K Koonce 12/20/2022, 8:19 AM

## 2022-12-20 NOTE — Progress Notes (Signed)
PROGRESS NOTE  Sandra [name]  DOB: July 25, 1933  PCP: [name], NP [id number]  DOA: 12/19/2022  LOS: 1 day  Hospital Day: 2  Brief narrative: Sandra [name] is a 89 y.o. female with PMH significant for DM2, HTN, HLD, CKD, TIA, anxiety/depression, GERD.  11/24, patient was seen in the ED for abdominal pain, diarrhea. CT abdomen at that time showed acute uncomplicated colitis involving the descending colon and proximal sigmoid colon.  She not meet criteria for admission and was discharged on a course of ciprofloxacin and Flagyl. Despite continuation of antibiotics, patient continued with nausea, vomiting, poor oral intake and generalized weakness and hence return to ED on 11/30.  In the ED, patient was afebrile, heart rate in 60s, blood pressure 130s, breathing room air Labs with WC count 8, hemoglobin 8.8, creatinine significant elevated to 58/5 compared to a baseline of 32/2.74 last week. Started on IV fluid Admitted to [facility]  Subjective: Patient was seen and examined this morning.  Pleasant elderly Caucasian female.  Lying in bed.  Not in distress.  Multiple family members at bedside. Alert, awake, able to talk but seems overwhelmed with medical issues.  Assessment and plan: AKI on CKD 4 Acute metabolic acidosis prerenal in the setting of dehydration from poor oral intake. Baseline creatinine less than 2.3 from August 2024.  Presented with creatinine elevated to 5.  Gradually improving. Continue LR at 100 mL/h Bicarb level low as well.  Start sodium bicarb supplement. Follows up with nephrology [name]. Bladder scan and renal ultrasound ruled out obstructive uropathy.  Recent Labs    01/04/22 1317 01/05/22 0735 01/11/22 2048 01/11/22 2052 06/08/22 0808 07/16/22 0010 08/21/22 1613 12/13/22 1323 12/19/22 1311 12/19/22 2019 12/20/22 0530  BUN 44* 37* 61* 58* 40* 40* 47* 32* 58*  --  53*  CREATININE 1.97* 1.83* 2.38* 2.50* 1.83* 1.64* 2.30* 2.74* 5.00*  4.26* 4.21*  CO2 21* 22 22  --  23 17* 22 19*  --   --  16*   Abnormal urinalysis Urinalysis obtained on admission showed clear yellow urine with trace hemoglobin, trace leukocytes and many bacteria.  No clear evidence of infection.  Recent diagnosis of colitis CT abdomen on 11/24 showed uncomplicated colitis of descending colon proximal sigmoid colon.   Was started on an outpatient course of ciprofloxacin and Flagyl.  Unable to tolerate few days at home because of nausea and vomiting. Currently on Rocephin and IV Flagyl x 2 days to complete course of antibiotics Monitor fever curve and WBCs  Chronic anemia Hemoglobin at baseline between 8 and 9.  Slightly low today at 7.7. Continue to monitor. Obtain anemia panel Recent Labs    12/13/22 1323 12/19/22 1255 12/19/22 1311 12/19/22 2019 12/20/22 0530  HGB 8.6* 8.8* 9.2* 8.2* 7.7*  MCV 98.9 98.9  --  98.9 100.4*    Type 2 diabetes mellitus with hyperglycemia A1c 8.2 on 11/30/2022 PTA meds- glimepiride 2 mg twice daily Currently on SSI/Accu-Cheks Recent Labs  Lab 12/19/22 2025 12/20/22 0859 12/20/22 1152  GLUCAP 229* 186* 270*    Hyperlipidemia Continue home Lipitor   GERD Continue home Pepcid   Hypertension PTA meds- Toprol 50 mg daily, amlodipine 5 mg daily, lisinopril 5 mg daily, Lasix 20 mg daily as needed continue Toprol-XL 12.5 mg daily only to avoid hypotension. Closely monitor vital signs   Chronic anxiety/depression Continue home Celexa   Generalized weakness PT OT assessment Fall precautions   Mobility: Encourage ambulation  Goals of care   Code Status:  Limited: Do not attempt resuscitation (DNR) -DNR-LIMITED -Do Not Intubate/DNI      DVT prophylaxis:  heparin injection 5,000 Units Start: 12/19/22 2200   Antimicrobials: IV Rocephin, IV Flagyl Fluid: LR at 100 mL/h Consultants: None Family Communication: Multiple family members at bedside  Status: Inpatient Level of care:  Med-Surg    Patient is from: Home Needs to continue in-hospital care: Creatinine remains elevated.  Needs IV fluid and creatinine monitoring Anticipated d/c to: Home in 1 to 2 days   Diet:  Diet Order             Diet heart healthy/carb modified Room service appropriate? Yes; Fluid consistency: Thin  Diet effective now                   Scheduled Meds:  aspirin EC  81 mg Oral Daily   atorvastatin  80 mg Oral QPM   citalopram  10 mg Oral Daily   clopidogrel  75 mg Oral Daily   famotidine  20 mg Oral QHS   feeding supplement  237 mL Oral BID BM   heparin  5,000 Units Subcutaneous Q8H   insulin aspart  0-15 Units Subcutaneous TID WC   insulin aspart  0-5 Units Subcutaneous QHS   metoprolol succinate  12.5 mg Oral Daily   sodium bicarbonate  1,300 mg Oral TID    PRN meds: acetaminophen, clonazePAM, melatonin, polyethylene glycol, prochlorperazine   Infusions:   cefTRIAXone (ROCEPHIN)  IV Stopped (12/20/22 0222)   lactated ringers Stopped (12/20/22 0146)   metronidazole 500 mg (12/20/22 1025)    Antimicrobials: Anti-infectives (From admission, onward)    Start     Dose/Rate Route Frequency Ordered Stop   12/20/22 0030  cefTRIAXone (ROCEPHIN) 2 g in sodium chloride 0.9 % 100 mL IVPB        2 g 200 mL/hr over 30 Minutes Intravenous Daily at bedtime 12/20/22 0021 12/21/22 2159   12/20/22 0030  metroNIDAZOLE (FLAGYL) IVPB 500 mg        500 mg 100 mL/hr over 60 Minutes Intravenous 2 times daily 12/20/22 0021 12/21/22 2159       Objective: Vitals:   12/20/22 0753 12/20/22 1531  BP: (!) 132/59 (!) 139/49  Pulse: 72 65  Resp: 18 19  Temp: 97.7 F (36.5 C) 97.7 F (36.5 C)  SpO2: 98% 96%    Intake/Output Summary (Last 24 hours) at 12/20/2022 1533 Last data filed at 12/20/2022 0502 Gross per 24 hour  Intake 842.71 ml  Output 475 ml  Net 367.71 ml   Filed Weights   12/19/22 1222 12/20/22 0511  Weight: 55 kg 60.4 kg   Weight change:  Body mass index is 26.01 kg/m.    Physical Exam: General exam: Pleasant, elderly Caucasian female.  Not in physical distress Skin: No rashes, lesions or ulcers. HEENT: Atraumatic, normocephalic, no obvious bleeding Lungs: Clear to auscultation bilaterally CVS: Regular rate and rhythm, no murmur GI/Abd soft, nontender, nondistended, bowel sound present CNS: Alert, awake, oriented x 3 Psychiatry: Seems anxious Extremities: No pedal edema, no calf tenderness  Data Review: I have personally reviewed the laboratory data and studies available.  F/u labs ordered Unresulted Labs (From admission, onward)     Start     Ordered   12/21/22 0500  Vitamin B12  (Anemia Panel (PNL))  Tomorrow morning,   R        12/20/22 1533   12/21/22 0500  Folate  (Anemia Panel (PNL))  Tomorrow morning,  R        12/20/22 1533   12/21/22 0500  Iron and TIBC  (Anemia Panel (PNL))  Tomorrow morning,   R        12/20/22 1533   12/21/22 0500  Ferritin  (Anemia Panel (PNL))  Tomorrow morning,   R        12/20/22 1533   12/21/22 0500  Reticulocytes  (Anemia Panel (PNL))  Tomorrow morning,   R        12/20/22 1533   12/21/22 0500  Basic metabolic panel  Daily,   R      12/20/22 1533   12/21/22 0500  CBC with Differential/Platelet  Daily,   R      12/20/22 1533   12/20/22 0020  Urinalysis, w/ Reflex to Culture (Infection Suspected) -Urine, Clean Catch  (Urine Labs)  Once,   R       Question:  Specimen Source  Answer:  Urine, Clean Catch   12/20/22 0019           Total time spent in review of labs and imaging, patient evaluation, formulation of plan, documentation and communication with family: 55 minutes  Signed, Lorin Glass, MD [facility] 12/20/2022

## 2022-12-20 NOTE — Evaluation (Signed)
Physical Therapy Evaluation Patient Details Name: Sandra [name] MRN: [id number] DOB: [birth year] Today's Date: 12/20/2022  History of Present Illness  Pt is an 89 y/o F presenting to ED on 11/30 with weakness, diarrhea, and emesis. Labs concerning for worsening renal insufficiency for creatinine >5. Admitted for further mgmt of AKI. PMH includes diabetes.  Clinical Impression   Pt admitted with above diagnosis. Lives at home with famil, in a single-level home with 1 steps to enter; Prior to admission, pt was able to walk without an assistvie device, drive short errands; Presents to PT with generalized weakness and decr activity tolerance, mild gait unsteadiness;  Overall needing contact guard assist with mobility and transfers; walked the hallway with contact guard assist for safety, one small loss of balance, needing min assist to steady; Rec HHPT follow up at home; Pt currently with functional limitations due to the deficits listed below (see PT Problem List). Pt will benefit from skilled PT to increase their independence and safety with mobility to allow discharge to the venue listed below.           If plan is discharge home, recommend the following: A little help with walking and/or transfers;Assistance with cooking/housework   Can travel by private vehicle        Equipment Recommendations Rolling walker (2 wheels);BSC/3in1  Recommendations for Other Services       Functional Status Assessment Patient has had a recent decline in their functional status and demonstrates the ability to make significant improvements in function in a reasonable and predictable amount of time.     Precautions / Restrictions Precautions Precautions: Fall Restrictions Weight Bearing Restrictions: No      Mobility  Bed Mobility               General bed mobility comments: seated EOB upon arrival, in chair at departure    Transfers Overall transfer level: Needs assistance Equipment used: 1  person hand held assist Transfers: Sit to/from Stand Sit to Stand: Contact guard assist           General transfer comment: smooth rise    Ambulation/Gait Ambulation/Gait assistance: Supervision, Min assist Gait Distance (Feet): 150 Feet Assistive device: None Gait Pattern/deviations: Step-through pattern       General Gait Details: walking with slower pace, but overall well; one instance of loss of balance, with min assist to recover  Stairs            Wheelchair Mobility     Tilt Bed    Modified Rankin (Stroke Patients Only)       Balance Overall balance assessment: Needs assistance Sitting-balance support: Feet supported Sitting balance-Leahy Scale: Normal     Standing balance support: During functional activity Standing balance-Leahy Scale: Fair Standing balance comment: intermittent external support                             Pertinent Vitals/Pain Pain Assessment Pain Assessment: No/denies pain    Home Living Family/patient expects to be discharged to:: Private residence Living Arrangements: Children (daughter & granddaughter) Available Help at Discharge: Family;Available 24 hours/day Type of Home: House Home Access: Stairs to enter   [employer] of Steps: 1   Home Layout: One level Home Equipment: Agricultural consultant (2 wheels);Cane - single point;Shower seat      Prior Function Prior Level of Function : Independent/Modified Independent;Driving             Mobility Comments: no  AD use ADLs Comments: sits for showers, has PRN assist from family for med mgmt/IADLs     Extremity/Trunk Assessment   Upper Extremity Assessment Upper Extremity Assessment: Defer to OT evaluation    Lower Extremity Assessment Lower Extremity Assessment: Generalized weakness    Cervical / Trunk Assessment Cervical / Trunk Assessment: Normal  Communication   Communication Communication: No apparent difficulties  Cognition  Arousal: Alert Behavior During Therapy: WFL for tasks assessed/performed Overall Cognitive Status: Within Functional Limits for tasks assessed                                          General Comments General comments (skin integrity, edema, etc.): NAD on RA    Exercises     Assessment/Plan    PT Assessment Patient needs continued PT services  PT Problem List Decreased strength;Decreased activity tolerance;Decreased balance;Decreased knowledge of precautions;Decreased knowledge of use of DME       PT Treatment Interventions DME instruction;Gait training;Stair training;Functional mobility training;Therapeutic activities;Therapeutic exercise;Balance training;Patient/family education    PT Goals (Current goals can be found in the Care Plan section)  Acute Rehab PT Goals Patient Stated Goal: back to home PT Goal Formulation: With patient Time For Goal Achievement: 01/03/23 Potential to Achieve Goals: Good    Frequency Min 1X/week     Co-evaluation               AM-PAC PT "6 Clicks" Mobility  Outcome Measure Help needed turning from your back to your side while in a flat bed without using bedrails?: None Help needed moving from lying on your back to sitting on the side of a flat bed without using bedrails?: A Little Help needed moving to and from a bed to a chair (including a wheelchair)?: A Little Help needed standing up from a chair using your arms (e.g., wheelchair or bedside chair)?: A Little Help needed to walk in hospital room?: A Little Help needed climbing 3-5 steps with a railing? : A Little 6 Click Score: 19    End of Session   Activity Tolerance: Patient tolerated treatment well Patient left: in chair;with call bell/phone within reach;with chair alarm set   PT Visit Diagnosis: Unsteadiness on feet (R26.81)    Time: 1610-9604 PT Time Calculation (min) (ACUTE ONLY): 19 min   Charges:   PT Evaluation $PT Eval Low Complexity: 1 Low    PT General Charges $$ ACUTE PT VISIT: 1 Visit         Van Clines, PT  Acute Rehabilitation Services Office [phone] Secure Chat welcomed   Levi Aland 12/20/2022, 2:17 PM

## 2022-12-21 DIAGNOSIS — N179 Acute kidney failure, unspecified: Secondary | ICD-10-CM | POA: Diagnosis not present

## 2022-12-21 DIAGNOSIS — N189 Chronic kidney disease, unspecified: Secondary | ICD-10-CM | POA: Diagnosis not present

## 2022-12-21 LAB — CBC WITH DIFFERENTIAL/PLATELET
Abs Immature Granulocytes: 0.03 10*3/uL (ref 0.00–0.07)
Basophils Absolute: 0 10*3/uL (ref 0.0–0.1)
Basophils Relative: 1 %
Eosinophils Absolute: 0.2 10*3/uL (ref 0.0–0.5)
Eosinophils Relative: 3 %
HCT: 25 % — ABNORMAL LOW (ref 36.0–46.0)
Hemoglobin: 8 g/dL — ABNORMAL LOW (ref 12.0–15.0)
Immature Granulocytes: 1 %
Lymphocytes Relative: 26 %
Lymphs Abs: 1.6 10*3/uL (ref 0.7–4.0)
MCH: 32.1 pg (ref 26.0–34.0)
MCHC: 32 g/dL (ref 30.0–36.0)
MCV: 100.4 fL — ABNORMAL HIGH (ref 80.0–100.0)
Monocytes Absolute: 0.6 10*3/uL (ref 0.1–1.0)
Monocytes Relative: 9 %
Neutro Abs: 3.6 10*3/uL (ref 1.7–7.7)
Neutrophils Relative %: 60 %
Platelets: 239 10*3/uL (ref 150–400)
RBC: 2.49 MIL/uL — ABNORMAL LOW (ref 3.87–5.11)
RDW: 11.8 % (ref 11.5–15.5)
WBC: 6 10*3/uL (ref 4.0–10.5)
nRBC: 0 % (ref 0.0–0.2)

## 2022-12-21 LAB — BASIC METABOLIC PANEL
Anion gap: 8 (ref 5–15)
BUN: 45 mg/dL — ABNORMAL HIGH (ref 8–23)
CO2: 17 mmol/L — ABNORMAL LOW (ref 22–32)
Calcium: 8.2 mg/dL — ABNORMAL LOW (ref 8.9–10.3)
Chloride: 118 mmol/L — ABNORMAL HIGH (ref 98–111)
Creatinine, Ser: 4.11 mg/dL — ABNORMAL HIGH (ref 0.44–1.00)
GFR, Estimated: 10 mL/min — ABNORMAL LOW (ref >60)
Glucose, Bld: 61 mg/dL — ABNORMAL LOW (ref 70–99)
Potassium: 3.9 mmol/L (ref 3.5–5.1)
Sodium: 143 mmol/L (ref 135–145)

## 2022-12-21 LAB — RETICULOCYTES
Immature Retic Fract: 10 % (ref 2.3–15.9)
RBC.: 2.49 MIL/uL — ABNORMAL LOW (ref 3.87–5.11)
Retic Count, Absolute: 49.8 10*3/uL (ref 19.0–186.0)
Retic Ct Pct: 2 % (ref 0.4–3.1)

## 2022-12-21 LAB — IRON AND TIBC
Iron: 111 ug/dL (ref 28–170)
Saturation Ratios: 57 % — ABNORMAL HIGH (ref 10.4–31.8)
TIBC: 196 ug/dL — ABNORMAL LOW (ref 250–450)
UIBC: 85 ug/dL

## 2022-12-21 LAB — GLUCOSE, CAPILLARY
Glucose-Capillary: 55 mg/dL — ABNORMAL LOW (ref 70–99)
Glucose-Capillary: 230 mg/dL — ABNORMAL HIGH (ref 70–99)
Glucose-Capillary: 207 mg/dL — ABNORMAL HIGH (ref 70–99)

## 2022-12-21 LAB — VITAMIN B12: Vitamin B-12: 238 pg/mL (ref 180–914)

## 2022-12-21 LAB — FERRITIN: Ferritin: 192 ng/mL (ref 11–307)

## 2022-12-21 LAB — FOLATE: Folate: 7.9 ng/mL (ref >5.9)

## 2022-12-21 MED ORDER — INSULIN GLARGINE-YFGN 100 UNIT/ML ~~LOC~~ SOLN
6.0000 [IU] | Freq: Every day | SUBCUTANEOUS | Status: DC
  Administered 2022-12-21: 6 [IU] via SUBCUTANEOUS
  Filled 2022-12-21: qty 0.06

## 2022-12-21 MED ORDER — INSULIN ASPART 100 UNIT/ML IJ SOLN
0.0000 [IU] | Freq: Three times a day (TID) | INTRAMUSCULAR | Status: DC
  Administered 2022-12-21 – 2022-12-22 (×3): 3 [IU] via SUBCUTANEOUS
  Administered 2022-12-22: 5 [IU] via SUBCUTANEOUS
  Administered 2022-12-23: 9 [IU] via SUBCUTANEOUS
  Administered 2022-12-23: 5 [IU] via SUBCUTANEOUS
  Administered 2022-12-24: 2 [IU] via SUBCUTANEOUS
  Administered 2022-12-24: 5 [IU] via SUBCUTANEOUS
  Administered 2022-12-25: 1 [IU] via SUBCUTANEOUS
  Administered 2022-12-25: 2 [IU] via SUBCUTANEOUS
  Administered 2022-12-25: 7 [IU] via SUBCUTANEOUS

## 2022-12-21 MED ORDER — SODIUM CHLORIDE 0.9 % IV SOLN: INTRAVENOUS | Status: DC

## 2022-12-21 MED ORDER — VITAMIN B-12 1000 MCG PO TABS
1000.0000 ug | ORAL_TABLET | Freq: Every day | ORAL | Status: DC
  Administered 2022-12-21 – 2022-12-29 (×9): 1000 ug via ORAL
  Filled 2022-12-21 (×9): qty 1

## 2022-12-21 MED ORDER — INSULIN ASPART 100 UNIT/ML IJ SOLN
0.0000 [IU] | Freq: Every day | INTRAMUSCULAR | Status: DC
  Administered 2022-12-21: 2 [IU] via SUBCUTANEOUS

## 2022-12-21 NOTE — Plan of Care (Signed)
  Problem: Education: Goal: Knowledge of General Education information will improve Description: Including pain rating scale, medication(s)/side effects and non-pharmacologic comfort measures Outcome: Progressing   Problem: Health Behavior/Discharge Planning: Goal: Ability to manage health-related needs will improve Outcome: Progressing   Problem: Clinical Measurements: Goal: Will remain free from infection Outcome: Progressing   Problem: Clinical Measurements: Goal: Respiratory complications will improve Outcome: Progressing   Problem: Activity: Goal: Risk for activity intolerance will decrease Outcome: Progressing   Problem: Coping: Goal: Level of anxiety will decrease Outcome: Progressing   Problem: Elimination: Goal: Will not experience complications related to urinary retention Outcome: Progressing   Problem: Safety: Goal: Ability to remain free from injury will improve Outcome: Progressing   Problem: Skin Integrity: Goal: Risk for impaired skin integrity will decrease Outcome: Progressing

## 2022-12-21 NOTE — Progress Notes (Signed)
Physical Therapy Treatment Patient Details Name: Sandra [name] MRN: [id number] DOB: 1933-08-30 Today's Date: 12/21/2022   History of Present Illness Pt is an 89 y/o F presenting to ED on 11/30 with weakness, diarrhea, and emesis. Labs concerning for worsening renal insufficiency for creatinine >5. Admitted for further mgmt of AKI. PMH includes diabetes.    PT Comments  Patient reports feeling stronger today and wanted to try walking without RW. Remained steady throughout walk. Performed standing LE exercises for strengthening and balance at sink with bil UE support via counter. Daughter present and reports she is now retired and will be with pt at all times on discharge.     If plan is discharge home, recommend the following: Assistance with cooking/housework;Help with stairs or ramp for entrance   Can travel by private vehicle        Equipment Recommendations  None recommended by PT (pt already has RW)    Recommendations for Other Services       Precautions / Restrictions Precautions Precautions: Fall Restrictions Weight Bearing Restrictions: No     Mobility  Bed Mobility Overal bed mobility: Needs Assistance Bed Mobility: Supine to Sit, Sit to Supine     Supine to sit: Min assist Sit to supine: Independent   General bed mobility comments: daughter assisted to raise trunk (states does not normally need assist at home)    Transfers Overall transfer level: Needs assistance Equipment used: None Transfers: Sit to/from Stand Sit to Stand: Supervision           General transfer comment: smooth rise    Ambulation/Gait Ambulation/Gait assistance: Supervision, Contact guard assist Gait Distance (Feet): 170 Feet Assistive device: None Gait Pattern/deviations: Step-through pattern       General Gait Details: good pace, no drift or staggering steps; does not want to use RW unless absolutely has to   [employer] Mobility     Tilt Bed     Modified Rankin (Stroke Patients Only)       Balance Overall balance assessment: Needs assistance Sitting-balance support: Feet supported Sitting balance-Leahy Scale: Normal     Standing balance support: During functional activity Standing balance-Leahy Scale: Fair                              Cognition Arousal: Alert Behavior During Therapy: WFL for tasks assessed/performed Overall Cognitive Status: Within Functional Limits for tasks assessed                                          Exercises General Exercises - Lower Extremity Hip ABduction/ADduction: AROM, Both, 10 reps, Standing Hip Flexion/Marching: AROM, Both, 20 reps, Standing Toe Raises: AROM, Both, 10 reps, Standing Heel Raises: AROM, Both, 10 reps, Standing Mini-Sqauts: AROM, Both, 10 reps, Standing    General Comments General comments (skin integrity, edema, etc.): Daughter present      Pertinent Vitals/Pain Pain Assessment Pain Assessment: No/denies pain    Home Living                          Prior Function            PT Goals (current goals can now be found in the care plan section) Acute Rehab PT Goals Patient Stated  Goal: back to home without using RW Time For Goal Achievement: 01/03/23 Potential to Achieve Goals: Good Progress towards PT goals: Progressing toward goals    Frequency    Min 1X/week      PT Plan      Co-evaluation              AM-PAC PT "6 Clicks" Mobility   Outcome Measure  Help needed turning from your back to your side while in a flat bed without using bedrails?: None Help needed moving from lying on your back to sitting on the side of a flat bed without using bedrails?: A Little Help needed moving to and from a bed to a chair (including a wheelchair)?: A Little Help needed standing up from a chair using your arms (e.g., wheelchair or bedside chair)?: A Little Help needed to walk in hospital room?: A Little Help  needed climbing 3-5 steps with a railing? : A Little 6 Click Score: 19    End of Session Equipment Utilized During Treatment: Gait belt Activity Tolerance: Patient tolerated treatment well Patient left: with call bell/phone within reach;in bed;with family/visitor present   PT Visit Diagnosis: Unsteadiness on feet (R26.81)     Time: 8469-6295 PT Time Calculation (min) (ACUTE ONLY): 16 min  Charges:    $Gait Training: 8-22 mins PT General Charges $$ ACUTE PT VISIT: 1 Visit                      Jerolyn Center, PT Acute Rehabilitation Services  Office [phone]    Zena Amos 12/21/2022, 2:00 PM

## 2022-12-21 NOTE — Progress Notes (Signed)
Transition of Care [facility]) - Inpatient Brief Assessment   Patient Details  Name: Sandra [name] MRN: [id number] Date of Birth: 1933/10/17  [facility] CM/SW Contact:    Janae Bridgeman, RN Phone Number: 12/21/2022, 12:27 PM   Clinical Narrative: CM met with the patient and daughter in the room to discuss TOC needs.  The patient lives with the daughter and granddaughter in the room.  Patient admitted to the hospital for acute remnal failure superimposed on chronic kidney disease.  The patient plans to return home when stable and was agreeable to home health services.  The patient was offered Medicare choice regarding home health and did not have a preference. I called Kandee Keen, RNCM with [facility] and he accepted for [facility] PT, RN.  HH orders placed to be co-signed by the MD.  DME at the home includes RW and shower chair.  Patient declines need for 3:1 or other DME.  No other needs.  Patient plans to return home with daughter when medically stable for discharge.   Transition of Care Asessment: Insurance and Status: (P) Insurance coverage has been reviewed Patient has primary care physician: (P) Yes Home environment has been reviewed: (P) from home with daughter, granddaughter Prior level of function:: (P) family assistance Prior/Current Home Services: (P) No current home services Social Determinants of Health Reivew: (P) SDOH reviewed interventions complete Readmission risk has been reviewed: (P) Yes Transition of care needs: (P) transition of care needs identified, TOC will continue to follow

## 2022-12-21 NOTE — Progress Notes (Signed)
  Hypoglycemic Event  CBG: 55mg /dl  Treatment : 04 OZ Orange Juice  Symptoms: None  Follow-up CBG: [id number]  CBG Result:107 mg/dl  Possible Reasons for Event: Poor eating  MD notified

## 2022-12-21 NOTE — Progress Notes (Signed)
PROGRESS NOTE  Sandra [name]  DOB: [birth year]  PCP: [name], NP BJY:[id number]  DOA: 12/19/2022  LOS: 2 days  Hospital Day: 3  Brief narrative: Sandra [name] is a 89 y.o. female with PMH significant for DM2, HTN, HLD, CKD, TIA, anxiety/depression, GERD.  11/24, patient was seen in the ED for abdominal pain, diarrhea. CT abdomen at that time showed acute uncomplicated colitis involving the descending colon and proximal sigmoid colon.  She not meet criteria for admission and was discharged on a course of ciprofloxacin and Flagyl. Despite continuation of antibiotics, patient continued with nausea, vomiting, poor oral intake and generalized weakness and hence return to ED on 11/30.  In the ED, patient was afebrile, heart rate in 60s, blood pressure 130s, breathing room air Labs with WC count 8, hemoglobin 8.8, creatinine significant elevated to 58/5 compared to a baseline of 32/2.74 last week. Started on IV fluid Admitted to [facility]  Subjective: Patient was seen and examined this morning. Hide on bed.  Not in distress.  No new symptoms. No family at bedside.  Labs from this morning showed mild improvement creatinine.  Assessment and plan: AKI on CKD 4 Acute metabolic acidosis prerenal in the setting of dehydration from poor oral intake. Baseline creatinine less than 2.3 from August 2024.  Presented with creatinine elevated to 5.  Gradually improving. It seems the fluid order expires without an alert.  Patient did not get any IV fluid overnight because the last builder expired yesterday evening.  I have resumed IV fluid normal saline at 125 mL/h for next 48 hours Bicarb level low as well.  Start sodium bicarb supplement. Follows up with nephrology [name]. Bladder scan and renal ultrasound ruled out obstructive uropathy.  Recent Labs    01/04/22 1317 01/05/22 0735 01/11/22 2048 01/11/22 2052 06/08/22 0808 07/16/22 0010 08/21/22 1613 12/13/22 1323 12/19/22 1311  12/19/22 2019 12/20/22 0530 12/21/22 0750  BUN 44* 37* 61* 58* 40* 40* 47* 32* 58*  --  53* 45*  CREATININE 1.97* 1.83* 2.38* 2.50* 1.83* 1.64* 2.30* 2.74* 5.00* 4.26* 4.21* 4.11*  CO2 21* 22 22  --  23 17* 22 19*  --   --  16* 17*   Abnormal urinalysis Urinalysis obtained on admission showed clear yellow urine with trace hemoglobin, trace leukocytes and many bacteria.  No clear evidence of infection.  Recent diagnosis of colitis CT abdomen on 11/24 showed uncomplicated colitis of descending colon proximal sigmoid colon.   Was started on an outpatient course of ciprofloxacin and Flagyl.  Unable to tolerate few days at home because of nausea and vomiting. Currently on Rocephin and IV Flagyl x 2 days to complete course of antibiotics Monitor fever curve and WBCs  Chronic anemia Hemoglobin at baseline between 8 and 9.  Slightly low today at 7.7. Continue to monitor. Anemia panel showed vitamin B12 level on the low side of normal.  I will start on oral vitamin B12 supplement Recent Labs    12/19/22 1255 12/19/22 1311 12/19/22 2019 12/20/22 0530 12/21/22 0750  HGB 8.8* 9.2* 8.2* 7.7* 8.0*  MCV 98.9  --  98.9 100.4* 100.4*  VITAMINB12  --   --   --   --  238  FOLATE  --   --   --   --  7.9  FERRITIN  --   --   --   --  192  TIBC  --   --   --   --  196*  IRON  --   --   --   --  111  RETICCTPCT  --   --   --   --  2.0    Type 2 diabetes mellitus Hypoglycemia/hyperglycemia A1c 8.2 on 11/30/2022 Blood sugar level was low at 55 this morning.  Later increased to over 200 PTA meds- glimepiride 2 mg twice daily.  Currently on hold Currently on moderate SSI/Accu-Cheks. I would reduce moderate SSI to sensitive scale SSI.  Start Semglee 6 units at bedtime. Recent Labs  Lab 12/20/22 1152 12/20/22 1631 12/20/22 2136 12/21/22 0733 12/21/22 1146  GLUCAP 270* 192* 111* 55* 230*    Hyperlipidemia Continue home Lipitor   GERD Continue home Pepcid   Hypertension PTA meds- Toprol  50 mg daily, amlodipine 5 mg daily, lisinopril 5 mg daily, Lasix 20 mg daily as needed continue Toprol-XL 12.5 mg daily only to avoid hypotension. Closely monitor vital signs   Chronic anxiety/depression Continue home Celexa   Generalized weakness PT OT assessment Fall precautions   Mobility: Encourage ambulation  Goals of care   Code Status: Limited: Do not attempt resuscitation (DNR) -DNR-LIMITED -Do Not Intubate/DNI      DVT prophylaxis:  heparin injection 5,000 Units Start: 12/19/22 2200   Antimicrobials: IV Rocephin, IV Flagyl, to complete the course today Fluid: NS at 125 mL/h Consultants: None Family Communication: No family member at bedside today  Status: Inpatient Level of care:  Med-Surg   Patient is from: Home Needs to continue in-hospital care: Creatinine improving but very slowly  Anticipated d/c to: Home in 1 to 2 days   Diet:  Diet Order             Diet heart healthy/carb modified Room service appropriate? Yes; Fluid consistency: Thin  Diet effective now                   Scheduled Meds:  aspirin EC  81 mg Oral Daily   atorvastatin  80 mg Oral QPM   citalopram  10 mg Oral Daily   clopidogrel  75 mg Oral Daily   famotidine  20 mg Oral QHS   feeding supplement  237 mL Oral BID BM   heparin  5,000 Units Subcutaneous Q8H   insulin aspart  0-5 Units Subcutaneous QHS   insulin aspart  0-9 Units Subcutaneous TID WC   insulin glargine-yfgn  6 Units Subcutaneous QHS   metoprolol succinate  12.5 mg Oral Daily   sodium bicarbonate  1,300 mg Oral TID    PRN meds: acetaminophen, clonazePAM, melatonin, polyethylene glycol, prochlorperazine   Infusions:   sodium chloride 125 mL/hr at 12/21/22 1610    Antimicrobials: Anti-infectives (From admission, onward)    Start     Dose/Rate Route Frequency Ordered Stop   12/20/22 0030  cefTRIAXone (ROCEPHIN) 2 g in sodium chloride 0.9 % 100 mL IVPB        2 g 200 mL/hr over 30 Minutes Intravenous  Daily at bedtime 12/20/22 0021 12/20/22 2243   12/20/22 0030  metroNIDAZOLE (FLAGYL) IVPB 500 mg        500 mg 100 mL/hr over 60 Minutes Intravenous 2 times daily 12/20/22 0021 12/21/22 1028       Objective: Vitals:   12/21/22 0408 12/21/22 0733  BP: 136/63 (!) 120/55  Pulse: 67 66  Resp: 17   Temp: 97.6 F (36.4 C) 97.6 F (36.4 C)  SpO2: 95% 91%   No intake or output data in the 24 hours ending 12/21/22 1237  Filed Weights   12/19/22 1222 12/20/22 0511 12/21/22 0435  Weight: 55 kg 60.4 kg 60.6 kg   Weight change: 5.6 kg Body mass index is 26.09 kg/m.   Physical Exam: General exam: Pleasant, elderly Caucasian female.  Not in physical distress Skin: No rashes, lesions or ulcers. HEENT: Atraumatic, normocephalic, no obvious bleeding Lungs: Clear to auscultation bilaterally CVS: Regular rate and rhythm, no murmur GI/Abd soft, nontender, nondistended, bowel sound present CNS: Alert, awake, oriented x 3 Psychiatry: Mood appropriate Extremities: No pedal edema, no calf tenderness  Data Review: I have personally reviewed the laboratory data and studies available.  F/u labs ordered Unresulted Labs (From admission, onward)     Start     Ordered   12/21/22 0500  Basic metabolic panel  Daily,   R      12/20/22 1533   12/21/22 0500  CBC with Differential/Platelet  Daily,   R      12/20/22 1533           Total time spent in review of labs and imaging, patient evaluation, formulation of plan, documentation and communication with family: 45 minutes  Signed, Lorin Glass, MD [facility] 12/21/2022

## 2022-12-21 NOTE — Inpatient Diabetes Management (Signed)
Inpatient Diabetes Program Recommendations  AACE/ADA: New Consensus Statement on Inpatient Glycemic Control (2015)  Target Ranges:  Prepandial:   less than 140 mg/dL      Peak postprandial:   less than 180 mg/dL (1-2 hours)      Critically ill patients:  140 - 180 mg/dL   Lab Results  Component Value Date   GLUCAP 55 (L) 12/21/2022   HGBA1C 8.2 (H) 11/30/2022    Latest Reference Range & Units 12/20/22 08:59 12/20/22 11:52 12/20/22 16:31 12/20/22 21:36 12/21/22 07:33  Glucose-Capillary 70 - 99 mg/dL 782 (H) Novolog 3 units 270 (H) Novolog 8 units 192 (H) Novolog 3 units 111 (H) 55 (L)  (H): Data is abnormally high (L): Data is abnormally low  R  Latest Reference Range & Units 12/21/22 07:50  BUN 8 - 23 mg/dL 45 (H)  Creatinine 9.56 - 1.00 mg/dL 2.13 (H)  (H): Data is abnormally higheview of Glycemic Control  Diabetes history: DM2 Outpatient Diabetes medications: Amaryl 2 mg bid, Novolog 6-8 units tid Current orders for Inpatient glycemic control: Novolog 0-15 units tid, 0-5 units hs  Inpatient Diabetes Program Recommendations:   Noted hypoglycemia. Please consider if not discharged today: -Decrease Novolog correction to 0-9 units tid, 0-5 units hs -Add Semglee 6 units daily while oral DM medications on hold   Thank you, Darel Hong E. Shaylinn Hladik, RN, MSN, CDCES  Diabetes Coordinator Inpatient Glycemic Control Team Team Pager [phone] (8am-5pm) 12/21/2022 9:55 AM

## 2022-12-22 ENCOUNTER — Other Ambulatory Visit: Payer: Self-pay | Admitting: Nurse Practitioner

## 2022-12-22 ENCOUNTER — Telehealth: Payer: Medicare HMO | Admitting: Family

## 2022-12-22 DIAGNOSIS — N179 Acute kidney failure, unspecified: Secondary | ICD-10-CM | POA: Diagnosis not present

## 2022-12-22 DIAGNOSIS — N189 Chronic kidney disease, unspecified: Secondary | ICD-10-CM | POA: Diagnosis not present

## 2022-12-22 DIAGNOSIS — E440 Moderate protein-calorie malnutrition: Secondary | ICD-10-CM

## 2022-12-22 LAB — BASIC METABOLIC PANEL
Anion gap: 8 (ref 5–15)
BUN: 39 mg/dL — ABNORMAL HIGH (ref 8–23)
CO2: 16 mmol/L — ABNORMAL LOW (ref 22–32)
Calcium: 7.6 mg/dL — ABNORMAL LOW (ref 8.9–10.3)
Chloride: 116 mmol/L — ABNORMAL HIGH (ref 98–111)
Creatinine, Ser: 3.62 mg/dL — ABNORMAL HIGH (ref 0.44–1.00)
GFR, Estimated: 12 mL/min — ABNORMAL LOW (ref >60)
Glucose, Bld: 79 mg/dL (ref 70–99)
Potassium: 3.6 mmol/L (ref 3.5–5.1)
Sodium: 140 mmol/L (ref 135–145)

## 2022-12-22 LAB — CBC WITH DIFFERENTIAL/PLATELET
Abs Immature Granulocytes: 0.05 10*3/uL (ref 0.00–0.07)
Basophils Absolute: 0.1 10*3/uL (ref 0.0–0.1)
Basophils Relative: 1 %
Eosinophils Absolute: 0.2 10*3/uL (ref 0.0–0.5)
Eosinophils Relative: 3 %
HCT: 24 % — ABNORMAL LOW (ref 36.0–46.0)
Hemoglobin: 8 g/dL — ABNORMAL LOW (ref 12.0–15.0)
Immature Granulocytes: 1 %
Lymphocytes Relative: 13 %
Lymphs Abs: 1 10*3/uL (ref 0.7–4.0)
MCH: 32.8 pg (ref 26.0–34.0)
MCHC: 33.3 g/dL (ref 30.0–36.0)
MCV: 98.4 fL (ref 80.0–100.0)
Monocytes Absolute: 0.6 10*3/uL (ref 0.1–1.0)
Monocytes Relative: 8 %
Neutro Abs: 5.3 10*3/uL (ref 1.7–7.7)
Neutrophils Relative %: 74 %
Platelets: 226 10*3/uL (ref 150–400)
RBC: 2.44 MIL/uL — ABNORMAL LOW (ref 3.87–5.11)
RDW: 12 % (ref 11.5–15.5)
WBC: 7.2 10*3/uL (ref 4.0–10.5)
nRBC: 0 % (ref 0.0–0.2)

## 2022-12-22 LAB — GLUCOSE, CAPILLARY
Glucose-Capillary: 107 mg/dL — ABNORMAL HIGH (ref 70–99)
Glucose-Capillary: 113 mg/dL — ABNORMAL HIGH (ref 70–99)
Glucose-Capillary: 122 mg/dL — ABNORMAL HIGH (ref 70–99)
Glucose-Capillary: 233 mg/dL — ABNORMAL HIGH (ref 70–99)
Glucose-Capillary: 241 mg/dL — ABNORMAL HIGH (ref 70–99)
Glucose-Capillary: 277 mg/dL — ABNORMAL HIGH (ref 70–99)
Glucose-Capillary: 49 mg/dL — ABNORMAL LOW (ref 70–99)
Glucose-Capillary: 93 mg/dL (ref 70–99)

## 2022-12-22 MED ORDER — ADULT MULTIVITAMIN W/MINERALS CH
1.0000 | ORAL_TABLET | Freq: Every day | ORAL | Status: DC
  Administered 2022-12-22 – 2022-12-27 (×6): 1 via ORAL
  Filled 2022-12-22 (×6): qty 1

## 2022-12-22 MED ORDER — AMLODIPINE BESYLATE 5 MG PO TABS
5.0000 mg | ORAL_TABLET | Freq: Every day | ORAL | Status: DC
  Administered 2022-12-23 – 2022-12-27 (×5): 5 mg via ORAL
  Filled 2022-12-22 (×5): qty 1

## 2022-12-22 MED ORDER — HALOPERIDOL LACTATE 5 MG/ML IJ SOLN: 5.0000 mg | Freq: Once | INTRAMUSCULAR | Status: DC | PRN

## 2022-12-22 MED ORDER — BOOST / RESOURCE BREEZE PO LIQD CUSTOM
1.0000 | Freq: Three times a day (TID) | ORAL | Status: DC
  Administered 2022-12-22 – 2022-12-29 (×9): 1 via ORAL

## 2022-12-22 MED ORDER — POLYETHYLENE GLYCOL 3350 17 G PO PACK
17.0000 g | PACK | Freq: Once | ORAL | Status: DC
  Filled 2022-12-22: qty 1

## 2022-12-22 NOTE — Progress Notes (Signed)
PROGRESS NOTE  Dellie Chelton  DOB: [birth year]  PCP: [name], NP ZOX:[id number]  DOA: 12/19/2022  LOS: 3 days  Hospital Day: 4  Brief narrative: Sandra [name] is a 89 y.o. female with PMH significant for DM2, HTN, HLD, CKD, TIA, anxiety/depression, GERD.  11/24, patient was seen in the ED for abdominal pain, diarrhea. CT abdomen at that time showed acute uncomplicated colitis involving the descending colon and proximal sigmoid colon.  She not meet criteria for admission and was discharged on a course of ciprofloxacin and Flagyl. Despite continuation of antibiotics, patient continued with nausea, vomiting, poor oral intake and generalized weakness and hence return to ED on 11/30.  In the ED, patient was afebrile, heart rate in 60s, blood pressure 130s, breathing room air Labs with WC count 8, hemoglobin 8.8, creatinine significant elevated to 58/5 compared to a baseline of 32/2.74 last week. Started on IV fluid Admitted to [facility]  Subjective: Patient was seen and examined this afternoon. Sitting up at the edge of the bed.  Taking her lunch. Daughter at bedside. Labs from this morning with creatinine improving.  Had episode of hypoglycemia this morning as well.  Assessment and plan: AKI on CKD 4 Acute metabolic acidosis prerenal in the setting of dehydration from poor oral intake. Baseline creatinine less than 2.3 from August 2024.  Presented with creatinine elevated to 5.  Gradually improving with IV fluid.  3.68 today. Continue IV hydration at least for the next 24 hours.  No evidence of volume overload so far. Bicarb level low as well.  Start sodium bicarb supplement. Follows up with nephrology [name] as an outpatient. Bladder scan and renal ultrasound ruled out obstructive uropathy.  Recent Labs    01/04/22 1317 01/05/22 0735 01/11/22 2048 01/11/22 2052 06/08/22 0808 07/16/22 0010 08/21/22 1613 12/13/22 1323 12/19/22 1311 12/19/22 2019 12/20/22 0530  12/21/22 0750 12/22/22 0606  BUN 44* 37* 61* 58* 40* 40* 47* 32* 58*  --  53* 45* 39*  CREATININE 1.97* 1.83* 2.38* 2.50* 1.83* 1.64* 2.30* 2.74* 5.00* 4.26* 4.21* 4.11* 3.62*  CO2 21* 22 22  --  23 17* 22 19*  --   --  16* 17* 16*   Abnormal urinalysis Urinalysis obtained on admission showed clear yellow urine with trace hemoglobin, trace leukocytes and many bacteria.  No clear evidence of infection.  Recent diagnosis of colitis CT abdomen on 11/24 showed uncomplicated colitis of descending colon proximal sigmoid colon.   Was started on an outpatient course of ciprofloxacin and Flagyl.  Unable to tolerate few days at home because of nausea and vomiting. Completed course antibiotics on 12/2 Monitor fever curve and WBCs No bowel movement in 2 days.  Ordered 1 dose of MiraLAX today  Chronic anemia Hemoglobin at baseline between 8 and 9.   Continue to monitor. Anemia panel showed vitamin B12 level on the low side of normal.  I started her on oral vitamin B12 supplement Recent Labs    12/19/22 1311 12/19/22 2019 12/20/22 0530 12/21/22 0750 12/22/22 0606  HGB 9.2* 8.2* 7.7* 8.0* 8.0*  MCV  --  98.9 100.4* 100.4* 98.4  VITAMINB12  --   --   --  238  --   FOLATE  --   --   --  7.9  --   FERRITIN  --   --   --  192  --   TIBC  --   --   --  196*  --   IRON  --   --   --  111  --   RETICCTPCT  --   --   --  2.0  --     Type 2 diabetes mellitus Hypoglycemia/hyperglycemia A1c 8.2 on 11/30/2022 Blood sugar level was low at 55 yesterday morning and 49 this morning.  Low blood glucose likely because of decreased renal clearance of insulin. PTA meds- glimepiride 2 mg twice daily.  Currently on hold Currently on Semglee 6 units nightly and sensitive SSI/Accu-Cheks. I stopped tonight's dose of Semglee.  Continue SSI Recent Labs  Lab 12/21/22 2016 12/22/22 0522 12/22/22 0630 12/22/22 0757 12/22/22 1234  GLUCAP 207* 49* 93 113* 241*    Hyperlipidemia Continue home Lipitor    GERD Continue home Pepcid   Hypertension PTA meds- Toprol 50 mg daily, amlodipine 5 mg daily, lisinopril 5 mg daily, Lasix 20 mg daily as needed Currently on Toprol-XL 12.5 mg daily only.  Orders on hold.  Blood pressure gradually trending up.  I will resume amlodipine 5 mg for tomorrow morning Closely monitor vital signs   Chronic anxiety/depression Continue home Celexa   Generalized weakness PT OT evaluation obtained.  Home with PT recommended Fall precautions   Mobility: Encourage ambulation  Goals of care   Code Status: Limited: Do not attempt resuscitation (DNR) -DNR-LIMITED -Do Not Intubate/DNI      DVT prophylaxis:  heparin injection 5,000 Units Start: 12/19/22 2200   Antimicrobials: Completed course of biotics Fluid: NS at 125 mL/h Consultants: None Family Communication: No family member at bedside today  Status: Inpatient Level of care:  Med-Surg   Patient is from: Home Needs to continue in-hospital care: Creatinine improving but very slowly  Anticipated d/c to: Home in 1 to 2 days   Diet:  Diet Order             Diet heart healthy/carb modified Room service appropriate? Yes; Fluid consistency: Thin  Diet effective now                   Scheduled Meds:  [START ON 12/23/2022] amLODipine  5 mg Oral Daily   aspirin EC  81 mg Oral Daily   atorvastatin  80 mg Oral QPM   citalopram  10 mg Oral Daily   clopidogrel  75 mg Oral Daily   vitamin B-12  1,000 mcg Oral Daily   famotidine  20 mg Oral QHS   feeding supplement  237 mL Oral BID BM   heparin  5,000 Units Subcutaneous Q8H   insulin aspart  0-5 Units Subcutaneous QHS   insulin aspart  0-9 Units Subcutaneous TID WC   metoprolol succinate  12.5 mg Oral Daily   polyethylene glycol  17 g Oral Once   sodium bicarbonate  1,300 mg Oral TID    PRN meds: acetaminophen, clonazePAM, melatonin, prochlorperazine   Infusions:   sodium chloride 125 mL/hr at 12/22/22 0759     Antimicrobials: Anti-infectives (From admission, onward)    Start     Dose/Rate Route Frequency Ordered Stop   12/20/22 0030  cefTRIAXone (ROCEPHIN) 2 g in sodium chloride 0.9 % 100 mL IVPB        2 g 200 mL/hr over 30 Minutes Intravenous Daily at bedtime 12/20/22 0021 12/20/22 2243   12/20/22 0030  metroNIDAZOLE (FLAGYL) IVPB 500 mg        500 mg 100 mL/hr over 60 Minutes Intravenous 2 times daily 12/20/22 0021 12/21/22 1028       Objective: Vitals:   12/22/22 0524 12/22/22 0750  BP: (!) 138/53 Marland Kitchen)  156/62  Pulse: 79 73  Resp: 16   Temp: 98.7 F (37.1 C) 98.8 F (37.1 C)  SpO2: 94% 92%    Intake/Output Summary (Last 24 hours) at 12/22/2022 1440 Last data filed at 12/22/2022 0735 Gross per 24 hour  Intake 2601.21 ml  Output --  Net 2601.21 ml    Filed Weights   12/20/22 0511 12/21/22 0435 12/22/22 0500  Weight: 60.4 kg 60.6 kg 60.9 kg   Weight change: 0.3 kg Body mass index is 26.22 kg/m.   Physical Exam: General exam: Pleasant, elderly Caucasian female.  Not in physical distress Skin: No rashes, lesions or ulcers. HEENT: Atraumatic, normocephalic, no obvious bleeding Lungs: Clear to auscultation bilaterally CVS: Regular rate and rhythm, no murmur GI/Abd soft, nontender, nondistended, bowel sound present CNS: Alert, awake, oriented x 3 Psychiatry: Mood appropriate Extremities: No pedal edema, no calf tenderness  Data Review: I have personally reviewed the laboratory data and studies available.  F/u labs ordered Unresulted Labs (From admission, onward)     Start     Ordered   12/21/22 0500  Basic metabolic panel  Daily,   R      12/20/22 1533   12/21/22 0500  CBC with Differential/Platelet  Daily,   R      12/20/22 1533           Total time spent in review of labs and imaging, patient evaluation, formulation of plan, documentation and communication with family: 45 minutes  Signed, Lorin Glass, MD [facility] 12/22/2022

## 2022-12-22 NOTE — Progress Notes (Signed)
Initial Nutrition Assessment  DOCUMENTATION CODES:   Non-severe (moderate) malnutrition in context of acute illness/injury  INTERVENTION:  Liberalized diet Boost Breeze po TID, each supplement provides 250 kcal and 9 grams of protein Multivitamin with minerals   NUTRITION DIAGNOSIS:   Moderate Malnutrition related to acute illness as evidenced by estimated needs, moderate fat depletion, moderate muscle depletion.    GOAL:   Patient will meet greater than or equal to 90% of their needs    MONITOR:   PO intake, Supplement acceptance, Weight trends  REASON FOR ASSESSMENT:   Malnutrition Screening Tool    ASSESSMENT:  89 y.o. f Presented to ED with c/o N/V/D,admitted with worsening renal insufficiency. PMH: TIA, SAH, GERD, Depression, HTN, DMT2, Stroke, anxiety, CKD III. Daughter in room with pt. Both pt and daughter reported declined intake.  Daughter stated no appetite upon conversation with pt she just some times feels too tired to eat and some times eating makes her tired.  She reports diarrhea. Self feeding and ambulates on her own. We discussed liberalized diet and Boost breeze supplements. She was in agreement with this.  There is no benefit to excessive dietary restrictions related advanced age, increased nutrient needs . Patient would benefit better from liberalized diet to help meet increased needs and promote better oral intake.  Admit weight: 55 kg Current weight: 60.9 kg  Weight history; 12/22/22 60.9 kg  12/13/22 54.2 kg  11/30/22 54.2 kg  09/10/22 55.8 kg  09/01/22 56.4 kg  08/21/22 55.4 kg  07/16/22 43.5 kg  06/02/22 54.6 kg  04/29/22 59 kg  02/12/22 59 kg    Last Weight  Most recent update: 12/22/2022  5:24 AM    Weight  60.9 kg (134 lb 4.2 oz)             Average Meal Intake: 50-100: 70% intake x 4 recorded meals  Nutritionally Relevant Medications: Scheduled Meds:  vitamin B-12  1,000 mcg Oral Daily   feeding supplement  237 mL Oral  BID BM    Continuous Infusions:  sodium chloride 125 mL/hr at 12/22/22 0759     Labs Reviewed    NUTRITION - FOCUSED PHYSICAL EXAM:  Flowsheet Row Most Recent Value  Orbital Region Mild depletion  Upper Arm Region Moderate depletion  Thoracic and Lumbar Region Mild depletion  Buccal Region Mild depletion  Temple Region Moderate depletion  Clavicle and Acromion Bone Region Mild depletion  Scapular Bone Region Unable to assess  Dorsal Hand Moderate depletion  Patellar Region Moderate depletion  Anterior Thigh Region Moderate depletion  Posterior Calf Region Moderate depletion  Edema (RD Assessment) None  Hair Reviewed  Eyes Reviewed  Mouth Reviewed  Skin Reviewed  Nails Reviewed       Diet Order:   Diet Order             Diet regular Room service appropriate? Yes; Fluid consistency: Thin  Diet effective now                   EDUCATION NEEDS:   Education needs have been addressed  Skin:  Skin Assessment: Reviewed RN Assessment  Last BM:  12/3  Height:   Ht Readings from Last 1 Encounters:  12/19/22 5' (1.524 m)    Weight:   Wt Readings from Last 1 Encounters:  12/22/22 60.9 kg    Ideal Body Weight:     BMI:  Body mass index is 26.22 kg/m.  Estimated Nutritional Needs:   Kcal:  1850-2150 kcal/d  Protein:  80-95 g/d  Fluid:  12ml/kcal    Jamelle Haring RDN, LDN Clinical Dietitian  RDN pager # available on Amion

## 2022-12-22 NOTE — Progress Notes (Signed)
Mobility Specialist Progress Note:    12/22/22 1541  Mobility  Activity Ambulated independently to bathroom;Ambulated independently in hallway  Level of Assistance Modified independent, requires aide device or extra time  Assistive Device Other (Comment) (IV pole)  Distance Ambulated (ft) 275 ft  Activity Response Tolerated well  Mobility Referral Yes  $Mobility charge 1 Mobility  Mobility Specialist Start Time (ACUTE ONLY) 1541  Mobility Specialist Stop Time (ACUTE ONLY) 1551  Mobility Specialist Time Calculation (min) (ACUTE ONLY) 10 min   Pt agreeable to mobility session. Ambulated in hallway, ModI with IV pole. Tolerated well, asx throughout. Returned pt back to room d/t fatigue. Left sitting on EOB, daughter at bedside, all needs met.   Feliciana Rossetti Mobility Specialist Please contact via Special educational needs teacher or  Rehab office at [phone]

## 2022-12-22 NOTE — Progress Notes (Signed)
Occupational Therapy Treatment Patient Details Name: Sandra [name] MRN: [id number] DOB: [birth year] Today's Date: 12/22/2022   History of present illness Pt is an 89 y/o F presenting to ED on 11/30 with weakness, diarrhea, and emesis. Labs concerning for worsening renal insufficiency for creatinine >5. Admitted for further mgmt of AKI. PMH includes diabetes.   OT comments  Pt progressing towards goals this session, overall needing CGA for bed mobility and simulated tub and toilet transfers with session without AD. Pt presenting with impairments listed below, will follow acutely. Continue to recommend HHOT at d/c.      If plan is discharge home, recommend the following:  A little help with walking and/or transfers;A little help with bathing/dressing/bathroom;Assistance with cooking/housework;Direct supervision/assist for financial management;Direct supervision/assist for medications management;Supervision due to cognitive status   Equipment Recommendations  None recommended by OT    Recommendations for Other Services PT consult    Precautions / Restrictions Precautions Precautions: Fall Restrictions Weight Bearing Restrictions: No       Mobility Bed Mobility Overal bed mobility: Needs Assistance Bed Mobility: Supine to Sit, Sit to Supine     Supine to sit: Contact guard Sit to supine: Contact guard assist        Transfers Overall transfer level: Needs assistance Equipment used: None Transfers: Sit to/from Stand Sit to Stand: Supervision                 Balance Overall balance assessment: Needs assistance Sitting-balance support: Feet supported Sitting balance-Leahy Scale: Normal     Standing balance support: During functional activity Standing balance-Leahy Scale: Fair Standing balance comment: intermittent external support                           ADL either performed or assessed with clinical judgement   ADL Overall ADL's : Needs  assistance/impaired                         Toilet Transfer: Contact guard Architect: Tub transfer;Ambulation;Contact guard assist   Functional mobility during ADLs: Contact guard assist      Extremity/Trunk Assessment Upper Extremity Assessment Upper Extremity Assessment: Generalized weakness   Lower Extremity Assessment Lower Extremity Assessment: Defer to PT evaluation        Vision   Vision Assessment?: No apparent visual deficits   Perception Perception Perception: Not tested   Praxis Praxis Praxis: Not tested    Cognition Arousal: Alert Behavior During Therapy: WFL for tasks assessed/performed Overall Cognitive Status: Within Functional Limits for tasks assessed                                          Exercises      Shoulder Instructions       General Comments VSS    Pertinent Vitals/ Pain       Pain Assessment Pain Assessment: No/denies pain  Home Living                                          Prior Functioning/Environment              Frequency  Min 1X/week        Progress  Toward Goals  OT Goals(current goals can now be found in the care plan section)  Progress towards OT goals: Progressing toward goals  Acute Rehab OT Goals Patient Stated Goal: none stated OT Goal Formulation: With patient Time For Goal Achievement: 01/03/23 Potential to Achieve Goals: Good ADL Goals Pt Will Perform Upper Body Dressing: with modified independence;sitting Pt Will Perform Lower Body Dressing: with modified independence;sitting/lateral leans;sit to/from stand Pt Will Transfer to Toilet: with modified independence;ambulating;regular height toilet Pt Will Perform Tub/Shower Transfer: Tub transfer;Shower transfer;with modified independence;shower seat;ambulating Additional ADL Goal #1: pt will be able to tolerate OOB activity x10 min in order to improve  ind with ADLs  Plan      Co-evaluation                 AM-PAC OT "6 Clicks" Daily Activity     Outcome Measure   Help from another person eating meals?: None Help from another person taking care of personal grooming?: A Little Help from another person toileting, which includes using toliet, bedpan, or urinal?: A Little Help from another person bathing (including washing, rinsing, drying)?: A Little Help from another person to put on and taking off regular upper body clothing?: A Little Help from another person to put on and taking off regular lower body clothing?: A Little 6 Click Score: 19    End of Session Equipment Utilized During Treatment: Gait belt  OT Visit Diagnosis: Unsteadiness on feet (R26.81);Other abnormalities of gait and mobility (R26.89);Muscle weakness (generalized) (M62.81)   Activity Tolerance Patient tolerated treatment well   Patient Left in bed;with call bell/phone within reach;with bed alarm set   Nurse Communication Mobility status        Time: 0912-0930 OT Time Calculation (min): 18 min  Charges: OT General Charges $OT Visit: 1 Visit OT Treatments $Self Care/Home Management : 8-22 mins  Carver Fila, OTD, OTR/L SecureChat Preferred Acute Rehab [phone]   [name] 12/22/2022, 9:37 AM

## 2022-12-22 NOTE — Progress Notes (Signed)
[facility] night cross cover note:   I was notified by RN that the patient is hypoglycemic this morning with CBG of 49, with RN conveying that this is the second morning in a row that the patient has experienced fasting hypoglycemia.  The patient received Semglee 6 units last evening.  RN conveys that the patient is awake, alert, and is receiving food and juice at this time, with repeat CBG pending at this time.   I subsequently discontinued the Semglee 6 units SQ nightly.     Newton Pigg, DO Hospitalist

## 2022-12-22 NOTE — Care Management Important Message (Signed)
Important Message  Patient Details  Name: Sandra [name] MRN: [id number] Date of Birth: 10/14/1933   Important Message Given:  Yes - Medicare IM     Dorena Bodo 12/22/2022, 3:11 PM

## 2022-12-22 NOTE — Progress Notes (Signed)
[facility] night cross cover note:  I was notified by RN that this patient is agitated, confused, refusing evening meds, with these behaviors refractory to attempts at verbal redirection.  In the setting of associated interference with ongoing medical treatment posing potential harm to themself, I have placed order for Haldol 5 mg iv x 1 dose prn for agitation.    Sandra Pigg, DO Hospitalist

## 2022-12-22 NOTE — Plan of Care (Signed)

## 2022-12-22 NOTE — Progress Notes (Signed)
Patient's daughter at bed side was concerning about abdominal distention on her mom. RN assessed the patient. Abdomen is soft. Bladder scan done. It was 34 ml. Made the provider aware. Will continue to monitor

## 2022-12-22 NOTE — Progress Notes (Signed)
Mobility Specialist Progress Note:   12/22/22 1049  Mobility  Activity Ambulated independently to bathroom  Level of Assistance Independent after set-up  Assistive Device None  Distance Ambulated (ft) 24 ft  Activity Response Tolerated well  Mobility Referral Yes  $Mobility charge 1 Mobility  Mobility Specialist Start Time (ACUTE ONLY) 1045  Mobility Specialist Stop Time (ACUTE ONLY) 1050  Mobility Specialist Time Calculation (min) (ACUTE ONLY) 5 min     12/22/22 1049  Mobility  Activity Ambulated independently to bathroom  Level of Assistance Independent after set-up  Assistive Device None  Distance Ambulated (ft) 24 ft  Activity Response Tolerated well  Mobility Referral Yes  $Mobility charge 1 Mobility  Mobility Specialist Start Time (ACUTE ONLY) 1045  Mobility Specialist Stop Time (ACUTE ONLY) 1050  Mobility Specialist Time Calculation (min) (ACUTE ONLY) 5 min   Pt received in bed, requesting assistance to bathroom. Able to sit EOB, stand at bedside, and ambulate to bathroom without assistance, independent after set up. SBA for safety. Tolerated well, void successful. Returned pt back to bed, alarm on, all needs met, call bell in reach.   Feliciana Rossetti Mobility Specialist Please contact via Special educational needs teacher or  Rehab office at [phone]

## 2022-12-22 NOTE — Telephone Encounter (Signed)
Patient's daughter called about canceling appointment, Mom is in the hospital and the appointment was with [facility] and I gave her the phone number to cancel appts with them

## 2022-12-22 NOTE — Telephone Encounter (Signed)
Recommend rescheduling appointment with Endocrinology.

## 2022-12-23 ENCOUNTER — Inpatient Hospital Stay (HOSPITAL_COMMUNITY): Payer: Medicare HMO

## 2022-12-23 DIAGNOSIS — N179 Acute kidney failure, unspecified: Secondary | ICD-10-CM | POA: Diagnosis not present

## 2022-12-23 DIAGNOSIS — N189 Chronic kidney disease, unspecified: Secondary | ICD-10-CM | POA: Diagnosis not present

## 2022-12-23 LAB — BASIC METABOLIC PANEL
Anion gap: 7 (ref 5–15)
BUN: 29 mg/dL — ABNORMAL HIGH (ref 8–23)
CO2: 18 mmol/L — ABNORMAL LOW (ref 22–32)
Calcium: 7.6 mg/dL — ABNORMAL LOW (ref 8.9–10.3)
Chloride: 116 mmol/L — ABNORMAL HIGH (ref 98–111)
Creatinine, Ser: 3.04 mg/dL — ABNORMAL HIGH (ref 0.44–1.00)
GFR, Estimated: 14 mL/min — ABNORMAL LOW (ref >60)
Glucose, Bld: 110 mg/dL — ABNORMAL HIGH (ref 70–99)
Potassium: 3.5 mmol/L (ref 3.5–5.1)
Sodium: 141 mmol/L (ref 135–145)

## 2022-12-23 LAB — BRAIN NATRIURETIC PEPTIDE: B Natriuretic Peptide: 549.7 pg/mL — ABNORMAL HIGH (ref 0.0–100.0)

## 2022-12-23 LAB — GLUCOSE, CAPILLARY
Glucose-Capillary: 118 mg/dL — ABNORMAL HIGH (ref 70–99)
Glucose-Capillary: 113 mg/dL — ABNORMAL HIGH (ref 70–99)
Glucose-Capillary: 269 mg/dL — ABNORMAL HIGH (ref 70–99)
Glucose-Capillary: 392 mg/dL — ABNORMAL HIGH (ref 70–99)
Glucose-Capillary: 140 mg/dL — ABNORMAL HIGH (ref 70–99)

## 2022-12-23 LAB — CBC WITH DIFFERENTIAL/PLATELET
Abs Immature Granulocytes: 0.04 10*3/uL (ref 0.00–0.07)
Basophils Absolute: 0 10*3/uL (ref 0.0–0.1)
Basophils Relative: 1 %
Eosinophils Absolute: 0.4 10*3/uL (ref 0.0–0.5)
Eosinophils Relative: 5 %
HCT: 25 % — ABNORMAL LOW (ref 36.0–46.0)
Hemoglobin: 8.3 g/dL — ABNORMAL LOW (ref 12.0–15.0)
Immature Granulocytes: 1 %
Lymphocytes Relative: 19 %
Lymphs Abs: 1.4 10*3/uL (ref 0.7–4.0)
MCH: 32.4 pg (ref 26.0–34.0)
MCHC: 33.2 g/dL (ref 30.0–36.0)
MCV: 97.7 fL (ref 80.0–100.0)
Monocytes Absolute: 0.7 10*3/uL (ref 0.1–1.0)
Monocytes Relative: 10 %
Neutro Abs: 4.6 10*3/uL (ref 1.7–7.7)
Neutrophils Relative %: 64 %
Platelets: 146 10*3/uL — ABNORMAL LOW (ref 150–400)
RBC: 2.56 MIL/uL — ABNORMAL LOW (ref 3.87–5.11)
RDW: 12 % (ref 11.5–15.5)
WBC: 7.1 10*3/uL (ref 4.0–10.5)
nRBC: 0 % (ref 0.0–0.2)

## 2022-12-23 MED ORDER — METOPROLOL TARTRATE 25 MG PO TABS
25.0000 mg | ORAL_TABLET | Freq: Once | ORAL | Status: AC
  Administered 2022-12-23: 25 mg via ORAL
  Filled 2022-12-23: qty 1

## 2022-12-23 MED ORDER — FUROSEMIDE 10 MG/ML IJ SOLN
20.0000 mg | Freq: Once | INTRAMUSCULAR | Status: AC
  Administered 2022-12-23: 20 mg via INTRAVENOUS
  Filled 2022-12-23: qty 2

## 2022-12-23 MED ORDER — HYDRALAZINE HCL 20 MG/ML IJ SOLN
10.0000 mg | INTRAMUSCULAR | Status: DC | PRN
  Administered 2022-12-23: 10 mg via INTRAVENOUS
  Filled 2022-12-23: qty 1

## 2022-12-23 MED ORDER — METOPROLOL SUCCINATE ER 50 MG PO TB24
50.0000 mg | ORAL_TABLET | Freq: Every day | ORAL | Status: DC
  Administered 2022-12-24 – 2022-12-29 (×6): 50 mg via ORAL
  Filled 2022-12-23 (×5): qty 1

## 2022-12-23 NOTE — Progress Notes (Signed)
Mobility Specialist Progress Note:   12/23/22 0945  Mobility  Activity Ambulated with assistance to bathroom;Ambulated with assistance in room  Level of Assistance Standby assist, set-up cues, supervision of patient - no hands on  Assistive Device None  Distance Ambulated (ft) 35 ft  Activity Response Tolerated well  Mobility Referral Yes  $Mobility charge 1 Mobility  Mobility Specialist Start Time (ACUTE ONLY) [id number]  Mobility Specialist Stop Time (ACUTE ONLY) 0945  Mobility Specialist Time Calculation (min) (ACUTE ONLY) 9 min   Pt agreeable to mobility session with encouragement. States she pushed herself too hard yesterday. Requested to ambulate to BR, no physical assistance needed. Pt back in bed with all needs met. Pt politely states she is "taking it easy today, will be ready to walk tomorrow".  Addison Lank Mobility Specialist Please contact via SecureChat or  Rehab office at [phone]

## 2022-12-23 NOTE — Plan of Care (Signed)
  Problem: Education: Goal: Knowledge of General Education information will improve Description: Including pain rating scale, medication(s)/side effects and non-pharmacologic comfort measures Outcome: Progressing   Problem: Health Behavior/Discharge Planning: Goal: Ability to manage health-related needs will improve Outcome: Progressing   Problem: Clinical Measurements: Goal: Ability to maintain clinical measurements within normal limits will improve Outcome: Progressing Goal: Will remain free from infection Outcome: Progressing Goal: Diagnostic test results will improve Outcome: Progressing Goal: Cardiovascular complication will be avoided Outcome: Progressing   Problem: Activity: Goal: Risk for activity intolerance will decrease Outcome: Progressing   Problem: Nutrition: Goal: Adequate nutrition will be maintained Outcome: Progressing   Problem: Coping: Goal: Level of anxiety will decrease Outcome: Progressing   Problem: Elimination: Goal: Will not experience complications related to bowel motility Outcome: Progressing Goal: Will not experience complications related to urinary retention Outcome: Progressing   Problem: Pain Management: Goal: General experience of comfort will improve Outcome: Progressing   Problem: Safety: Goal: Ability to remain free from injury will improve Outcome: Progressing   Problem: Skin Integrity: Goal: Risk for impaired skin integrity will decrease Outcome: Progressing   Problem: Education: Goal: Ability to describe self-care measures that may prevent or decrease complications (Diabetes Survival Skills Education) will improve Outcome: Progressing Goal: Individualized Educational Video(s) Outcome: Progressing   Problem: Coping: Goal: Ability to adjust to condition or change in health will improve Outcome: Progressing   Problem: Fluid Volume: Goal: Ability to maintain a balanced intake and output will improve Outcome:  Progressing   Problem: Health Behavior/Discharge Planning: Goal: Ability to identify and utilize available resources and services will improve Outcome: Progressing Goal: Ability to manage health-related needs will improve Outcome: Progressing   Problem: Metabolic: Goal: Ability to maintain appropriate glucose levels will improve Outcome: Progressing   Problem: Nutritional: Goal: Maintenance of adequate nutrition will improve Outcome: Progressing Goal: Progress toward achieving an optimal weight will improve Outcome: Progressing   Problem: Skin Integrity: Goal: Risk for impaired skin integrity will decrease Outcome: Progressing   Problem: Tissue Perfusion: Goal: Adequacy of tissue perfusion will improve Outcome: Progressing

## 2022-12-23 NOTE — Progress Notes (Signed)
Patient had SOB. RN at bedside. RR was 42 that time. called RT. initiated O2 at 3 L. She was anxious. gave clonazepam .  Made provider aware. Will continue to monitor.

## 2022-12-23 NOTE — Progress Notes (Signed)
Physical Therapy Treatment Patient Details Name: Sandra [name] MRN: [id number] DOB: [birth year] Today's Date: 12/23/2022   History of Present Illness Pt is an 89 y/o F presenting to ED on 11/30 with weakness, diarrhea, and emesis. Labs concerning for worsening renal insufficiency for creatinine >5. Admitted for further mgmt of AKI. PMH includes diabetes.    PT Comments  Pt received in the bathroom very upset about not being brought toilet paper and stating she walked to the bathroom unassisted. Pt provided with toilet paper and is able to perform hygiene tasks without assist.  Pt declines hallway ambulation due to being upset, however is agreeable to exercise in the room. Pt able to stand x10 without UE support with good power up and stability. Pt able to stand and march, however demonstrates increased instability without UE support and requires single HHA for balance. Pt then requests to return to bed due to increased fatigue. Pt continues to benefit from PT services to progress toward functional mobility goals.    If plan is discharge home, recommend the following: Assistance with cooking/housework;Help with stairs or ramp for entrance   Can travel by private vehicle        Equipment Recommendations  None recommended by PT    Recommendations for Other Services       Precautions / Restrictions Precautions Precautions: Fall Restrictions Weight Bearing Restrictions: No     Mobility  Bed Mobility Overal bed mobility: Needs Assistance Bed Mobility: Sit to Supine       Sit to supine: Supervision   General bed mobility comments: increased time    Transfers Overall transfer level: Needs assistance Equipment used: None Transfers: Sit to/from Stand Sit to Stand: Supervision           General transfer comment: from low toilet and recliner with supervision for safety    Ambulation/Gait Ambulation/Gait assistance: Supervision Gait Distance (Feet): 10 Feet Assistive  device: None Gait Pattern/deviations: Step-through pattern       General Gait Details: slightly unsteady gait without UE support, but no LOB. supervision for safety       Balance Overall balance assessment: Needs assistance Sitting-balance support: Feet supported Sitting balance-Leahy Scale: Normal     Standing balance support: During functional activity, Single extremity supported, No upper extremity supported Standing balance-Leahy Scale: Fair Standing balance comment: benefits from UE support. increased unsteadiness during static marching without UE support                            Cognition Arousal: Alert Behavior During Therapy: WFL for tasks assessed/performed Overall Cognitive Status: Within Functional Limits for tasks assessed                                          Exercises General Exercises - Lower Extremity Hip Flexion/Marching: AROM, Both, 20 reps, Standing Other Exercises Other Exercises: x10 serial STS without UE support    General Comments        Pertinent Vitals/Pain Pain Assessment Pain Assessment: No/denies pain     PT Goals (current goals can now be found in the care plan section) Acute Rehab PT Goals Patient Stated Goal: back to home without using RW PT Goal Formulation: With patient Time For Goal Achievement: 01/03/23 Progress towards PT goals: Progressing toward goals    Frequency    Min 1X/week  AM-PAC PT "6 Clicks" Mobility   Outcome Measure  Help needed turning from your back to your side while in a flat bed without using bedrails?: None Help needed moving from lying on your back to sitting on the side of a flat bed without using bedrails?: A Little Help needed moving to and from a bed to a chair (including a wheelchair)?: A Little Help needed standing up from a chair using your arms (e.g., wheelchair or bedside chair)?: A Little Help needed to walk in hospital room?: A Little Help needed  climbing 3-5 steps with a railing? : A Little 6 Click Score: 19    End of Session   Activity Tolerance: Patient tolerated treatment well;Patient limited by fatigue Patient left: with call bell/phone within reach;in bed;with bed alarm set Nurse Communication: Mobility status PT Visit Diagnosis: Unsteadiness on feet (R26.81)     Time: 2951-8841 PT Time Calculation (min) (ACUTE ONLY): 20 min  Charges:    $Therapeutic Exercise: 8-22 mins PT General Charges $$ ACUTE PT VISIT: 1 Visit                     Johny Shock, PTA Acute Rehabilitation Services Secure Chat Preferred  [phone]    Johny Shock 12/23/2022, 1:35 PM

## 2022-12-23 NOTE — Progress Notes (Addendum)
TRH night cross cover note:   I was notified by RN that the patient is complaining of some shortness of breath, appears dyspneic after ambulating to the bathroom, and has new finding of crackles on exam.  Associate with any report of chest pain.  Additional vital signs at this time are notable for the following: Afebrile, heart rates in the 70s, blood pressure 183/67, respiratory rate is increased in the high 20s.  Additionally, oxygen saturation now in the low 90s on room air, compared to the mid 90s on room air yesterday afternoon.  Hospital course includes acute kidney injury superimposed on CKD, for which she has been receiving normal saline at 125 cc/h.  Will hold her continuous IV fluids for now, and pursue chest x-ray to further evaluate.  Given the degree of her tachypnea, I've also ordered Lasix 20 mg iv x 1 dose. Will also check bnp.    Newton Pigg, DO Hospitalist

## 2022-12-23 NOTE — Progress Notes (Signed)
TRH night cross cover note:   I was notified by RN of most recent BP 156/96, with HR's in the 70's.   I added as needed IV hydralazine for systolic blood pressure greater than 160 or diastolic blood pressure greater than 100 mmHg.     Newton Pigg, DO Hospitalist

## 2022-12-23 NOTE — Progress Notes (Signed)
PROGRESS NOTE Sandra [name]  OZH:[id number] DOB: 07/11/1933 DOA: 12/19/2022 PCP: [name], NP  Brief Narrative/Hospital Course: 19 yof w/ DM2, HTN, HLD, CKD 4 b/l ~ 2.3, TIA, anxiety/depression, GERD who was seen in the ED for abdominal pain, diarrhea.CT abdomen showed acute uncomplicated colitis involving the descending colon and proximal sigmoid colon. She did not meet criteria for admission and was discharged on a course of ciprofloxacin and Flagyl. Despite continuation of antibiotics,patient continued with nausea, vomiting, poor oral intake and generalized weakness and hence returned to ED on 11/30 again. In the ED,patient was afebrile,HR 60s,BP 130s, RA. Labs with WBC- 8,HB 8.8, creatinine significant elevated to 58/5 compared to a baseline of 32/2.74 last week. Started on IV fluid and admitted for further management.    Subjective: Seen this am Overnight afebrile BP stable Labs- hco3 16> 18, creat 3.6->3.0 Hb ~ 8 gm UOP 150cc, 6x unmeasured output. Overnight patient was short of breath and dyspneic with ambulation, blood pressure on higher side, IV fluids were held chest x-ray obtained and Lasix given x 1, BNP elevated 549, chest x-ray with chronic bronchitic changes. She feels much better now, having her breakfast. She thinks she had panic attack  last night  Assessment and Plan: Principal Problem:   Acute renal failure superimposed on chronic kidney disease (HCC) Active Problems:   Malnutrition of moderate degree  AKI on CKD 4 Acute on chronic  metabolic acidosis AKI prerenal in the setting of dehydration poor oral intake b/l creat  ~ 2.3 from August 2024 on admit in 5. Bladder scan and renal ultrasound ruled out obstructive uropathy. Improving with IV fluids-continue midnight except Lasix due to shortness of breath, creatinine appears stable, tolerating p.o. well.  Hold off on further IV fluids during labs.  If continues to improve we will hold off on nephrology  consultation, monitor UOP continu sod bicarb. She follows up with nephrology [name].  Recent Labs    01/05/22 0735 01/11/22 2048 01/11/22 2052 06/08/22 0808 07/16/22 0010 08/21/22 1613 12/13/22 1323 12/19/22 1311 12/19/22 2019 12/20/22 0530 12/21/22 0750 12/22/22 0606 12/23/22 0654  BUN 37* 61* 58* 40* 40* 47* 32* 58*  --  53* 45* 39* 29*  CREATININE 1.83* 2.38* 2.50* 1.83* 1.64* 2.30* 2.74* 5.00* 4.26* 4.21* 4.11* 3.62* 3.04*  CO2 22 22  --  23 17* 22 19*  --   --  16* 17* 16* 18*   Abnormal urinalysis Urinalysis obtained on admission showed clear yellow urine with trace hemoglobin, trace leukocytes and many bacteria.  No evidence of infection  Recent diagnosis of colitis CT abdomen on 11/24 showed uncomplicated colitis of descending colon proximal sigmoid colon.   Was started on an outpatient course of ciprofloxacin and Flagyl.  Unable to tolerate few days at home because of nausea and vomiting and she completed course antibiotics on 12/2 Denies any GI symptoms currently. S/p miralax 12/3  Chronic macrocytic anemia w/ b12 insufficiency anemia: B/l hb  ~ 8 and 9.  Remains stable monitor. Anemia panel showed vitamin B12 level on the low side of normal.  I started her on oral vitamin B12 supplement Recent Labs    12/19/22 2019 12/20/22 0530 12/21/22 0750 12/22/22 0606 12/23/22 0654  HGB 8.2* 7.7* 8.0* 8.0* 8.3*  MCV 98.9 100.4* 100.4* 98.4 97.7  VITAMINB12  --   --  238  --   --   FOLATE  --   --  7.9  --   --   FERRITIN  --   --  192  --   --   TIBC  --   --  196*  --   --   IRON  --   --  111  --   --   RETICCTPCT  --   --  2.0  --   --     Type 2 diabetes mellitus Hypoglycemia/hyperglycemia A1c 8.2 on 11/30/2022 Poorly controlled on admission currently stable.  Had episode of hypoglycemia previously likely from decreased renal clearance of insulin. PTA meds- glimepiride 2 mg twice daily and holding home meds including long-acting insulin. Continue SSI for  now Recent Labs  Lab 12/22/22 1234 12/22/22 1602 12/22/22 2346 12/23/22 0653 12/23/22 0828  GLUCAP 241* 277* 122* 118* 113*    Hyperlipidemia Continue home Lipitor   GERD Continue home Pepcid   Hypertension PTA meds- Toprol 50 mg daily, amlodipine 5 mg daily, lisinopril 5 mg daily, Lasix 20 mg daily as needed BP fairly controlled on Toprol-XL 12.5 mg amlodipine 5 mg and monitor   Chronic anxiety/depression Continue home Celexa   Generalized weakness Debility deconditioning: Continue PT OT fall precaution recommending home health PT  Nutrition Problem: Moderate Malnutrition Etiology: acute illness Signs/Symptoms: estimated needs, moderate fat depletion, moderate muscle depletion Interventions: Boost Breeze, MVI  DVT prophylaxis: heparin injection 5,000 Units Start: 12/19/22 2200 Code Status:   Code Status: Limited: Do not attempt resuscitation (DNR) -DNR-LIMITED -Do Not Intubate/DNI  Family Communication: plan of care discussed with patient/none  at bedside. Patient status is:  INPATIENT  because of AKI. Level of care: Med-Surg   Dispo: The patient is from: home, daughter lives with her            Anticipated disposition: TBDPatient underwent steroid Objective: Vitals last 24 hrs: Vitals:   12/23/22 0037 12/23/22 0230 12/23/22 0446 12/23/22 0500  BP: (!) 183/67 (!) 165/65 (!) 156/96   Pulse: 77 67 74   Resp: (!) 28 20 17    Temp:   (!) 97.5 F (36.4 C)   TempSrc:      SpO2: 92% 98% 99%   Weight:    60.5 kg  Height:       Weight change: -0.4 kg  Physical Examination: General exam: alert awake, older than stated age HEENT:Oral mucosa moist, Ear/Nose WNL grossly Respiratory system: bilaterally CLEAR BS, no use of accessory muscle Cardiovascular system: S1 & S2 +, No JVD. Gastrointestinal system: Abdomen soft,NT,ND, BS+ Nervous System:Alert, awake, moving extremities. Extremities: LE edema NEG,distal peripheral pulses palpable.  Skin: No rashes,no  icterus. MSK: Normal muscle bulk,tone, power  Medications reviewed:  Scheduled Meds:  amLODipine  5 mg Oral Daily   aspirin EC  81 mg Oral Daily   atorvastatin  80 mg Oral QPM   citalopram  10 mg Oral Daily   clopidogrel  75 mg Oral Daily   vitamin B-12  1,000 mcg Oral Daily   famotidine  20 mg Oral QHS   feeding supplement  1 Container Oral TID BM   feeding supplement  237 mL Oral BID BM   heparin  5,000 Units Subcutaneous Q8H   insulin aspart  0-5 Units Subcutaneous QHS   insulin aspart  0-9 Units Subcutaneous TID WC   metoprolol succinate  12.5 mg Oral Daily   multivitamin with minerals  1 tablet Oral Daily   polyethylene glycol  17 g Oral Once   sodium bicarbonate  1,300 mg Oral TID   Continuous Infusions:    Diet Order  Diet regular Room service appropriate? Yes; Fluid consistency: Thin  Diet effective now                    Intake/Output Summary (Last 24 hours) at 12/23/2022 1033 Last data filed at 12/23/2022 0501 Gross per 24 hour  Intake 2081.38 ml  Output 150 ml  Net 1931.38 ml   Net IO Since Admission: 5,475.3 mL [12/23/22 0823]  Wt Readings from Last 3 Encounters:  12/23/22 60.5 kg  12/13/22 54.2 kg  11/30/22 54.2 kg     Unresulted Labs (From admission, onward)    None      Data Reviewed: I have personally reviewed following labs and imaging studies CBC: Recent Labs  Lab 12/19/22 2019 12/20/22 0530 12/21/22 0750 12/22/22 0606 12/23/22 0654  WBC 6.3 6.2 6.0 7.2 7.1  NEUTROABS  --   --  3.6 5.3 4.6  HGB 8.2* 7.7* 8.0* 8.0* 8.3*  HCT 26.0* 23.8* 25.0* 24.0* 25.0*  MCV 98.9 100.4* 100.4* 98.4 97.7  PLT 253 225 239 226 146*   Basic Metabolic Panel:  Recent Labs  Lab 12/19/22 1311 12/19/22 2019 12/20/22 0530 12/21/22 0750 12/22/22 0606 12/23/22 0654  NA 140  --  141 143 140 141  K 4.5  --  4.3 3.9 3.6 3.5  CL 113*  --  119* 118* 116* 116*  CO2  --   --  16* 17* 16* 18*  GLUCOSE 222*  --  177* 61* 79 110*  BUN 58*  --   53* 45* 39* 29*  CREATININE 5.00* 4.26* 4.21* 4.11* 3.62* 3.04*  CALCIUM  --   --  7.9* 8.2* 7.6* 7.6*  PHOS  --   --  5.7*  --   --   --    GFR: Estimated Creatinine Clearance: 10.8 mL/min (A) (by C-G formula based on SCr of 3.04 mg/dL (H)). Liver Function Tests:  Recent Labs  Lab 12/20/22 0530  ALBUMIN 2.7*   Recent Labs  Lab 12/19/22 1255  LIPASE 54*   No results for input(s): "AMMONIA" in the last 168 hours. Coagulation Profile: No results for input(s): "INR", "PROTIME" in the last 168 hours. No results for input(s): "PROBNP" in the last 168 hours.  No results for input(s): "HGBA1C" in the last 72 hours. Recent Labs  Lab 12/22/22 1234 12/22/22 1602 12/22/22 2346 12/23/22 0653 12/23/22 0828  GLUCAP 241* 277* 122* 118* 113*   No results for input(s): "CHOL", "HDL", "LDLCALC", "TRIG", "CHOLHDL", "LDLDIRECT" in the last 72 hours. No results for input(s): "TSH", "T4TOTAL", "FREET4", "T3FREE", "THYROIDAB" in the last 72 hours. Sepsis Labs: Recent Labs  Lab 12/19/22 1529 12/19/22 2019  LATICACIDVEN 0.8 1.2    No results found for this or any previous visit (from the past 240 hour(s)).  Antimicrobials: Anti-infectives (From admission, onward)    Start     Dose/Rate Route Frequency Ordered Stop   12/20/22 0030  cefTRIAXone (ROCEPHIN) 2 g in sodium chloride 0.9 % 100 mL IVPB        2 g 200 mL/hr over 30 Minutes Intravenous Daily at bedtime 12/20/22 0021 12/20/22 2243   12/20/22 0030  metroNIDAZOLE (FLAGYL) IVPB 500 mg        500 mg 100 mL/hr over 60 Minutes Intravenous 2 times daily 12/20/22 0021 12/21/22 1028      Culture/Microbiology    Component Value Date/Time   SDES URINE, CATHETERIZED 06/23/2019 0318   SPECREQUEST  06/23/2019 0318    NONE Performed at [facility]  Lab, [address], [place], [place] [address]    CULT MULTIPLE SPECIES PRESENT, SUGGEST RECOLLECTION (A) 06/23/2019 0318   REPTSTATUS 06/24/2019 FINAL 06/23/2019 0318    Other culture-see  note  Radiology Studies: DG Chest Port 1 View  Result Date: 12/23/2022 CLINICAL DATA:  Shortness of breath EXAM: PORTABLE CHEST 1 VIEW COMPARISON:  07/16/2022 FINDINGS: Slightly shallow inspiration. Mild cardiac enlargement. No vascular congestion. Peribronchial thickening with coarse interstitial changes in the perihilar region likely representing chronic bronchitis. No airspace disease or consolidation. No pleural effusions. No pneumothorax. Mediastinal contours appear intact. Degenerative changes in the spine. Postoperative changes in the cervical spine. IMPRESSION: Chronic bronchitic changes in the lungs. Mild cardiac enlargement. No focal consolidation. Electronically Signed   By: Burman Nieves M.D.   On: 12/23/2022 01:04     LOS: 4 days   Total time spent in review of labs and imaging, patient evaluation, formulation of plan, documentation and communication with family: 35 minutes  Lanae Boast, MD [facility]  12/23/2022, 8:23 AM

## 2022-12-23 NOTE — Hospital Course (Addendum)
HPI: 35 yof w/ DM2, HTN, HLD, CKD 4 b/l ~ 2.3, TIA, anxiety/depression, GERD who was seen in the ED for abdominal pain, diarrhea.CT abdomen showed acute uncomplicated colitis involving the descending colon and proximal sigmoid colon. She did not meet criteria for admission and was discharged on a course of ciprofloxacin and Flagyl. Despite continuation of antibiotics,patient continued with nausea, vomiting, poor oral intake and generalized weakness and hence returned to ED on 11/30 again. In the ED,patient was afebrile,HR 60s,BP 130s, RA. Labs with WBC- 8,HB 8.8, creatinine significant elevated to 58/5 compared to a baseline of 32/2.74 last week. Started on IV fluid and admitted for further management. Resuming amaryl at 1 mg dose to make sure she can tolerate  before discharge   Significant Events: Admitted 12/19/2022 acute on chronic kidney disease stage 4   Significant Labs: Admission WBC 8, Hgb 8.8, Na 141, K 4.3, bicarb 16, BUN 53, Scr 4.21 B12 238(nl) Folate 7.9(nl) Iron 111(nl), TIBC 196(low), %sat 57 (high), ferritin 192(nl)  Significant Imaging Studies: Admission Renal U/S shows Normal ultrasound appearance of the kidneys. No mass or hydronephrosis identified. Echo showed normal LVEF 60%. No intracardiac shunt.  Antibiotic Therapy: Anti-infectives (From admission, onward)    Start     Dose/Rate Route Frequency Ordered Stop   12/20/22 0030  cefTRIAXone (ROCEPHIN) 2 g in sodium chloride 0.9 % 100 mL IVPB        2 g 200 mL/hr over 30 Minutes Intravenous Daily at bedtime 12/20/22 0021 12/20/22 2243   12/20/22 0030  metroNIDAZOLE (FLAGYL) IVPB 500 mg        500 mg 100 mL/hr over 60 Minutes Intravenous 2 times daily 12/20/22 0021 12/21/22 1028       Procedures:   Consultants:

## 2022-12-23 NOTE — Plan of Care (Signed)
  Problem: Education: Goal: Knowledge of General Education information will improve Description: Including pain rating scale, medication(s)/side effects and non-pharmacologic comfort measures Outcome: Progressing   Problem: Health Behavior/Discharge Planning: Goal: Ability to manage health-related needs will improve Outcome: Progressing   Problem: Clinical Measurements: Goal: Will remain free from infection Outcome: Progressing   Problem: Activity: Goal: Risk for activity intolerance will decrease Outcome: Progressing   Problem: Elimination: Goal: Will not experience complications related to bowel motility Outcome: Progressing   Problem: Elimination: Goal: Will not experience complications related to urinary retention Outcome: Progressing   Problem: Pain Management: Goal: General experience of comfort will improve Outcome: Progressing   Problem: Safety: Goal: Ability to remain free from injury will improve Outcome: Progressing   Problem: Skin Integrity: Goal: Risk for impaired skin integrity will decrease Outcome: Progressing   Problem: Skin Integrity: Goal: Risk for impaired skin integrity will decrease Outcome: Progressing

## 2022-12-24 DIAGNOSIS — N179 Acute kidney failure, unspecified: Secondary | ICD-10-CM | POA: Diagnosis not present

## 2022-12-24 DIAGNOSIS — N184 Chronic kidney disease, stage 4 (severe): Secondary | ICD-10-CM | POA: Diagnosis not present

## 2022-12-24 LAB — CBC
HCT: 23.9 % — ABNORMAL LOW (ref 36.0–46.0)
Hemoglobin: 7.7 g/dL — ABNORMAL LOW (ref 12.0–15.0)
MCH: 31 pg (ref 26.0–34.0)
MCHC: 32.2 g/dL (ref 30.0–36.0)
MCV: 96.4 fL (ref 80.0–100.0)
Platelets: 224 10*3/uL (ref 150–400)
RBC: 2.48 MIL/uL — ABNORMAL LOW (ref 3.87–5.11)
RDW: 11.9 % (ref 11.5–15.5)
WBC: 8 10*3/uL (ref 4.0–10.5)
nRBC: 0 % (ref 0.0–0.2)

## 2022-12-24 LAB — GLUCOSE, CAPILLARY
Glucose-Capillary: 112 mg/dL — ABNORMAL HIGH (ref 70–99)
Glucose-Capillary: 151 mg/dL — ABNORMAL HIGH (ref 70–99)
Glucose-Capillary: 253 mg/dL — ABNORMAL HIGH (ref 70–99)
Glucose-Capillary: 131 mg/dL — ABNORMAL HIGH (ref 70–99)

## 2022-12-24 LAB — BASIC METABOLIC PANEL
Anion gap: 10 (ref 5–15)
BUN: 26 mg/dL — ABNORMAL HIGH (ref 8–23)
CO2: 23 mmol/L (ref 22–32)
Calcium: 7.9 mg/dL — ABNORMAL LOW (ref 8.9–10.3)
Chloride: 107 mmol/L (ref 98–111)
Creatinine, Ser: 3.12 mg/dL — ABNORMAL HIGH (ref 0.44–1.00)
GFR, Estimated: 14 mL/min — ABNORMAL LOW (ref >60)
Glucose, Bld: 110 mg/dL — ABNORMAL HIGH (ref 70–99)
Potassium: 3.3 mmol/L — ABNORMAL LOW (ref 3.5–5.1)
Sodium: 140 mmol/L (ref 135–145)

## 2022-12-24 MED ORDER — POTASSIUM CHLORIDE CRYS ER 20 MEQ PO TBCR
20.0000 meq | EXTENDED_RELEASE_TABLET | Freq: Once | ORAL | Status: AC
  Administered 2022-12-24: 20 meq via ORAL
  Filled 2022-12-24: qty 1

## 2022-12-24 NOTE — Inpatient Diabetes Management (Signed)
Inpatient Diabetes Program Recommendations  AACE/ADA: New Consensus Statement on Inpatient Glycemic Control (2015)  Target Ranges:  Prepandial:   less than 140 mg/dL      Peak postprandial:   less than 180 mg/dL (1-2 hours)      Critically ill patients:  140 - 180 mg/dL   Lab Results  Component Value Date   GLUCAP 112 (H) 12/24/2022   HGBA1C 8.2 (H) 11/30/2022    Review of Glycemic Control  Diabetes history: DM2 Outpatient Diabetes medications: Amaryl 2 mg bid, Novolog 6-8 units tid Current orders for Inpatient glycemic control: Novolog 0-9 units tid, 0-5 units hs  Referral received for hypoglycemia, poorly controlled diabetes.  DM coordinator recommended decreasing insulin correction on 12/21/22 which was changed.  A1C was 8.2% on 11/30/22.  Ordered the Living Well with Diabetes booklet.    Will continue to follow while inpatient.  Thank you, Dulce Sellar, MSN, CDCES Diabetes Coordinator Inpatient Diabetes Program [phone] (team pager from 8a-5p)

## 2022-12-24 NOTE — Plan of Care (Signed)

## 2022-12-24 NOTE — Progress Notes (Signed)
Physical Therapy Treatment Patient Details Name: Sandra [name] MRN: [id number] DOB: 12-23-1933 Today's Date: 12/24/2022   History of Present Illness Pt is an 89 y/o F presenting to ED on 11/30 with weakness, diarrhea, and emesis. Labs concerning for worsening renal insufficiency for creatinine >5. Admitted for further mgmt of AKI. PMH includes diabetes.    PT Comments  Great tolerance with gait today, up to 300 feet without assistive device. Mild instability but able to self correct. Denies SOB. Feels much better than yesterday, eager to go home. Declines stair training prior to d/c, feels confident she can navigate single threshold into home and will have help from family arranged. Reviewed LE exercises. Encouraged OOB frequently with staff during admission. Patient will continue to benefit from skilled physical therapy services to further improve independence with functional mobility.     If plan is discharge home, recommend the following: Assistance with cooking/housework;Help with stairs or ramp for entrance   Can travel by private vehicle        Equipment Recommendations  None recommended by PT    Recommendations for Other Services       Precautions / Restrictions Precautions Precautions: Fall Restrictions Weight Bearing Restrictions: No     Mobility  Bed Mobility Overal bed mobility: Needs Assistance Bed Mobility: Supine to Sit     Supine to sit: Supervision     General bed mobility comments: supervision for safety, near mod I level. Cues to sit for amoment before standing, monitor symptoms.    Transfers Overall transfer level: Needs assistance Equipment used: None Transfers: Sit to/from Stand Sit to Stand: Supervision           General transfer comment: Supervision for safety from low bed setting, minimal instability, declined RW for support.    Ambulation/Gait Ambulation/Gait assistance: Supervision Gait Distance (Feet): 300 Feet Assistive device:  None Gait Pattern/deviations: Step-through pattern, Drifts right/left Gait velocity: dec Gait velocity interpretation: 1.31 - 2.62 ft/sec, indicative of limited community ambulator   General Gait Details: Mild instability but able to self correct, educated on awareness. Declines RW for support. No overt LOB during bout. pace slightly reduced from standard norms. Able to turn head and recognize objects without LOB.   Stairs Stairs:  (Declines to practice. states now that she has a single threshold to climb to enter home and family will guard if needed.)           Wheelchair Mobility     Tilt Bed    Modified Rankin (Stroke Patients Only)       Balance Overall balance assessment: Needs assistance Sitting-balance support: Feet supported Sitting balance-Leahy Scale: Normal     Standing balance support: During functional activity, Single extremity supported, No upper extremity supported Standing balance-Leahy Scale: Fair Standing balance comment: no UE support                            Cognition Arousal: Alert Behavior During Therapy: WFL for tasks assessed/performed Overall Cognitive Status: Within Functional Limits for tasks assessed                                          Exercises General Exercises - Lower Extremity Ankle Circles/Pumps: AROM, Both, 10 reps, Seated Quad Sets: Strengthening, Both, 10 reps, Seated Gluteal Sets: Strengthening, Both, 10 reps, Seated    General Comments General comments (skin  integrity, edema, etc.): Denies SOB      Pertinent Vitals/Pain Pain Assessment Pain Assessment: No/denies pain    Home Living                          Prior Function            PT Goals (current goals can now be found in the care plan section) Acute Rehab PT Goals Patient Stated Goal: back to home without using RW PT Goal Formulation: With patient Time For Goal Achievement: 01/03/23 Potential to Achieve Goals:  Good Progress towards PT goals: Progressing toward goals    Frequency    Min 1X/week      PT Plan      Co-evaluation              AM-PAC PT "6 Clicks" Mobility   Outcome Measure  Help needed turning from your back to your side while in a flat bed without using bedrails?: None Help needed moving from lying on your back to sitting on the side of a flat bed without using bedrails?: A Little Help needed moving to and from a bed to a chair (including a wheelchair)?: A Little Help needed standing up from a chair using your arms (e.g., wheelchair or bedside chair)?: A Little Help needed to walk in hospital room?: A Little Help needed climbing 3-5 steps with a railing? : A Little 6 Click Score: 19    End of Session Equipment Utilized During Treatment: Gait belt Activity Tolerance: Patient tolerated treatment well Patient left: with call bell/phone within reach;in chair;with chair alarm set Nurse Communication: Mobility status PT Visit Diagnosis: Unsteadiness on feet (R26.81)     Time: 4098-1191 PT Time Calculation (min) (ACUTE ONLY): 16 min  Charges:    $Gait Training: 8-22 mins PT General Charges $$ ACUTE PT VISIT: 1 Visit                     Kathlyn Sacramento, PT, DPT [facility]  Rehabilitation Services Physical Therapist Office: [phone] Website: [email or web address]    Berton Mount 12/24/2022, 10:49 AM

## 2022-12-24 NOTE — Progress Notes (Signed)
Mobility Specialist Progress Note:    12/24/22 1014  Mobility  Activity Transferred from chair to bed  Level of Assistance Standby assist, set-up cues, supervision of patient - no hands on  Assistive Device None  Distance Ambulated (ft) 3 ft  Activity Response Tolerated well  Mobility Referral Yes  $Mobility charge 1 Mobility  Mobility Specialist Start Time (ACUTE ONLY) 1010  Mobility Specialist Stop Time (ACUTE ONLY) 1014  Mobility Specialist Time Calculation (min) (ACUTE ONLY) 4 min   Responded to chair alarm, pt requesting assistance to transfer C>B. No AD required, pt independently ambulated to bed. Tolerated well, asx throughout. Bed alarm on, all needs met, call bell in reach.    Feliciana Rossetti Mobility Specialist Please contact via Special educational needs teacher or  Rehab office at [phone]

## 2022-12-24 NOTE — Progress Notes (Signed)
PROGRESS NOTE Sandra [name]  NWG:[id number] DOB: [birth year] DOA: 12/19/2022 PCP: [name], NP  Brief Narrative/Hospital Course: 17 yof w/ DM2, HTN, HLD, CKD 4 b/l ~ 2.3, TIA, anxiety/depression, GERD who was seen in the ED for abdominal pain, diarrhea.CT abdomen showed acute uncomplicated colitis involving the descending colon and proximal sigmoid colon. She did not meet criteria for admission and was discharged on a course of ciprofloxacin and Flagyl. Despite continuation of antibiotics,patient continued with nausea, vomiting, poor oral intake and generalized weakness and hence returned to ED on 11/30 again. In the ED,patient was afebrile,HR 60s,BP 130s, RA. Labs with WBC- 8,HB 8.8, creatinine significant elevated to 58/5 compared to a baseline of 32/2.74 last week. Started on IV fluid and admitted for further management.  Subjective: Seen and examined this morning resting comfortably no complaints Overnight patient has been afebrile, BP in 140s to 150s, on room  Labs this morning hemoglobin at 7.7 from 8.3.  Got lasix iv yesterday for shortness of breath and also panicking and tachypneic- calmed down after her clonazepam. 12/4  bnp was 549, cxr-mild cariomegaly and chronic bronchitic changes.  Assessment and Plan: Principal Problem:   Acute renal failure superimposed on chronic kidney disease (HCC) Active Problems:   Malnutrition of moderate degree  AKI on CKD 4 Acute on chronic  metabolic acidosis AKI prerenal in the setting of dehydration poor oral intake b/l creat  ~ 2.3 from August 2024, was  2..1 In Nov 30 2022 at [facility] [place] kidney office. on admit in 5.Bladder scan and renal ultrasound ruled out obstructive uropathy. She follows up with nephrology [name].  Initially seen IV fluids with improvement  but needed lasix for shortness of breath 20mg  x 2 with Bnp in 500s on 12/4. PTA on prn lasix lisinopril and on hold. Having unmeasured voids.  Overall appears slightly dry-I  discussed with Nephro-reviewed her recent labs of 2.1 and plan is to watch her closely without Lasix or IV fluids for another day, encourage oral hydration, continue sodium bicarb and monitor output  Recent Labs    01/11/22 2048 01/11/22 2052 06/08/22 0808 07/16/22 0010 08/21/22 1613 12/13/22 1323 12/19/22 1311 12/19/22 2019 12/20/22 0530 12/21/22 0750 12/22/22 0606 12/23/22 0654 12/24/22 0615  BUN 61*   < > 40* 40* 47* 32* 58*  --  53* 45* 39* 29* 26*  CREATININE 2.38*   < > 1.83* 1.64* 2.30* 2.74* 5.00* 4.26* 4.21* 4.11* 3.62* 3.04* 3.12*  CO2 22  --  23 17* 22 19*  --   --  16* 17* 16* 18* 23   < > = values in this interval not displayed.   Hx of G1DD, lvef 60-65% 12/2021: PTA on lasix 20 mg prn.  Abnormal urinalysis Urinalysis obtained on admission showed clear yellow urine with trace hemoglobin, trace leukocytes and many bacteria.  No evidence of infection  Recent diagnosis of colitis CT abdomen on 11/24 showed uncomplicated colitis of descending colon proximal sigmoid colon.   Was started on an outpatient course of ciprofloxacin and Flagyl.  Unable to tolerate few days at home because of nausea and vomiting and she completed course antibiotics on 12/2.  Currently no GI symptoms.  Received MiraLAX 12/3 had BM 12/4  Chronic macrocytic anemia w/ b12 insufficiency anemia: B/l hb  ~ 8 and 9.  Level fluctuating in 7-8, monitor. Anemia panel showed vitamin B12 level on the low side of normal started on oral vitamin B12 supplement Recent Labs    12/20/22 0530 12/21/22 0750 12/22/22  6295 12/23/22 0654 12/24/22 0615  HGB 7.7* 8.0* 8.0* 8.3* 7.7*  MCV 100.4* 100.4* 98.4 97.7 96.4  VITAMINB12  --  238  --   --   --   FOLATE  --  7.9  --   --   --   FERRITIN  --  192  --   --   --   TIBC  --  196*  --   --   --   IRON  --  111  --   --   --   RETICCTPCT  --  2.0  --   --   --     Type 2 diabetes mellitus Hypoglycemia/hyperglycemia A1c 8.2 on 11/30/2022 Poorly controlled  on admission.Had episode of hypoglycemia due to glimepiride in the setting of AKI CKD and discontinued.  Keeping on sliding scale insulin for now holding long-acting insulin.  Monitor.  Appears to have brittle diabetes Recent Labs  Lab 12/23/22 0828 12/23/22 1223 12/23/22 1627 12/23/22 2058 12/24/22 0745  GLUCAP 113* 269* 392* 140* 112*    Hyperlipidemia On Lipitor   GERD Continue home Pepcid   Hypertension PTA meds- Toprol 50 mg daily, amlodipine 5 mg daily, lisinopril 5 mg daily, Lasix 20 mg daily as needed BP borderline control resumed Toprol 50 and cont amlodipine 5, monitor   Chronic anxiety/depression Frequent panic attacks: Continue home Celexa and prn clonazepam   Thrombocytopenia: Platelet improved this morning  Generalized weakness Debility deconditioning: Continue PT OT fall precaution recommending home health PT  Nutrition Problem: Moderate Malnutrition Etiology: acute illness Signs/Symptoms: estimated needs, moderate fat depletion, moderate muscle depletion Interventions: Boost Breeze, MVI  DVT prophylaxis: heparin injection 5,000 Units Start: 12/19/22 2200 Code Status:   Code Status: Limited: Do not attempt resuscitation (DNR) -DNR-LIMITED -Do Not Intubate/DNI  Family Communication: plan of care discussed with patient/none  at bedside/called and updated daughter Patient status is:  INPATIENT  because of AKI. Level of care: Med-Surg   Dispo: The patient is from: home, daughter lives with her            Anticipated disposition: TBD pending clinical improvement  Objective: Vitals last 24 hrs: Vitals:   12/23/22 2114 12/24/22 0408 12/24/22 0454 12/24/22 0743  BP: (!) 145/88 (!) 154/58  (!) 176/58  Pulse: 76 80  79  Resp: 18 18  20   Temp: 98.5 F (36.9 C) 98.9 F (37.2 C)  98.7 F (37.1 C)  TempSrc: Oral Oral  Oral  SpO2: 94% 92%  91%  Weight:   61.7 kg   Height:       Weight change: 1.2 kg  Physical Examination: General exam: alert awake,  pleasant HEENT:Oral mucosa moist, Ear/Nose WNL grossly Respiratory system: Bilaterally mild basal crackles, no jvd Cardiovascular system: S1 & S2 +,M0 Gastrointestinal system: Abdomen soft,NT,ND, BS+ Nervous System: Alert, awake, moving all extremities,and following commands. Extremities: LE edema neg,distal peripheral pulses palpable and warm.  Skin: No rashes,no icterus. MSK: Normal muscle bulk,tone, power   Medications reviewed:  Scheduled Meds:  amLODipine  5 mg Oral Daily   aspirin EC  81 mg Oral Daily   atorvastatin  80 mg Oral QPM   citalopram  10 mg Oral Daily   clopidogrel  75 mg Oral Daily   vitamin B-12  1,000 mcg Oral Daily   famotidine  20 mg Oral QHS   feeding supplement  1 Container Oral TID BM   feeding supplement  237 mL Oral BID BM   heparin  5,000 Units  Subcutaneous Q8H   insulin aspart  0-5 Units Subcutaneous QHS   insulin aspart  0-9 Units Subcutaneous TID WC   metoprolol succinate  50 mg Oral Daily   multivitamin with minerals  1 tablet Oral Daily   polyethylene glycol  17 g Oral Once   potassium chloride  20 mEq Oral Once   sodium bicarbonate  1,300 mg Oral TID   Continuous Infusions:    Diet Order             Diet regular Room service appropriate? Yes; Fluid consistency: Thin  Diet effective now                    Intake/Output Summary (Last 24 hours) at 12/24/2022 0920 Last data filed at 12/23/2022 1300 Gross per 24 hour  Intake 200 ml  Output --  Net 200 ml   Net IO Since Admission: 5,775.3 mL [12/24/22 0920]  Wt Readings from Last 3 Encounters:  12/24/22 61.7 kg  12/13/22 54.2 kg  11/30/22 54.2 kg     Unresulted Labs (From admission, onward)     Start     Ordered   12/24/22 0500  Basic metabolic panel  Daily,   R     Question:  Specimen collection method  Answer:  Lab=Lab collect   12/23/22 1128   12/24/22 0500  CBC  Daily,   R     Question:  Specimen collection method  Answer:  Lab=Lab collect   12/23/22 1128            Data Reviewed: I have personally reviewed following labs and imaging studies CBC: Recent Labs  Lab 12/20/22 0530 12/21/22 0750 12/22/22 0606 12/23/22 0654 12/24/22 0615  WBC 6.2 6.0 7.2 7.1 8.0  NEUTROABS  --  3.6 5.3 4.6  --   HGB 7.7* 8.0* 8.0* 8.3* 7.7*  HCT 23.8* 25.0* 24.0* 25.0* 23.9*  MCV 100.4* 100.4* 98.4 97.7 96.4  PLT 225 239 226 146* 224   Basic Metabolic Panel:  Recent Labs  Lab 12/20/22 0530 12/21/22 0750 12/22/22 0606 12/23/22 0654 12/24/22 0615  NA 141 143 140 141 140  K 4.3 3.9 3.6 3.5 3.3*  CL 119* 118* 116* 116* 107  CO2 16* 17* 16* 18* 23  GLUCOSE 177* 61* 79 110* 110*  BUN 53* 45* 39* 29* 26*  CREATININE 4.21* 4.11* 3.62* 3.04* 3.12*  CALCIUM 7.9* 8.2* 7.6* 7.6* 7.9*  PHOS 5.7*  --   --   --   --    GFR: Estimated Creatinine Clearance: 10.6 mL/min (A) (by C-G formula based on SCr of 3.12 mg/dL (H)). Liver Function Tests:  Recent Labs  Lab 12/20/22 0530  ALBUMIN 2.7*   Recent Labs  Lab 12/19/22 1255  LIPASE 54*   No results for input(s): "AMMONIA" in the last 168 hours. Coagulation Profile: No results for input(s): "INR", "PROTIME" in the last 168 hours. No results for input(s): "PROBNP" in the last 168 hours.  No results for input(s): "HGBA1C" in the last 72 hours. Recent Labs  Lab 12/23/22 0828 12/23/22 1223 12/23/22 1627 12/23/22 2058 12/24/22 0745  GLUCAP 113* 269* 392* 140* 112*   No results for input(s): "CHOL", "HDL", "LDLCALC", "TRIG", "CHOLHDL", "LDLDIRECT" in the last 72 hours. No results for input(s): "TSH", "T4TOTAL", "FREET4", "T3FREE", "THYROIDAB" in the last 72 hours. Sepsis Labs: Recent Labs  Lab 12/19/22 1529 12/19/22 2019  LATICACIDVEN 0.8 1.2    No results found for this or any previous visit (from the  past 240 hour(s)).  Antimicrobials: Anti-infectives (From admission, onward)    Start     Dose/Rate Route Frequency Ordered Stop   12/20/22 0030  cefTRIAXone (ROCEPHIN) 2 g in sodium chloride 0.9 % 100 mL  IVPB        2 g 200 mL/hr over 30 Minutes Intravenous Daily at bedtime 12/20/22 0021 12/20/22 2243   12/20/22 0030  metroNIDAZOLE (FLAGYL) IVPB 500 mg        500 mg 100 mL/hr over 60 Minutes Intravenous 2 times daily 12/20/22 0021 12/21/22 1028      Culture/Microbiology    Component Value Date/Time   SDES URINE, CATHETERIZED 06/23/2019 0318   SPECREQUEST  06/23/2019 0318    NONE Performed at [facility], [address] [place], [place] [address]    CULT MULTIPLE SPECIES PRESENT, SUGGEST RECOLLECTION (A) 06/23/2019 0318   REPTSTATUS 06/24/2019 FINAL 06/23/2019 0318    Other culture-see note  Radiology Studies: DG Chest Port 1 View  Result Date: 12/23/2022 CLINICAL DATA:  Shortness of breath EXAM: PORTABLE CHEST 1 VIEW COMPARISON:  07/16/2022 FINDINGS: Slightly shallow inspiration. Mild cardiac enlargement. No vascular congestion. Peribronchial thickening with coarse interstitial changes in the perihilar region likely representing chronic bronchitis. No airspace disease or consolidation. No pleural effusions. No pneumothorax. Mediastinal contours appear intact. Degenerative changes in the spine. Postoperative changes in the cervical spine. IMPRESSION: Chronic bronchitic changes in the lungs. Mild cardiac enlargement. No focal consolidation. Electronically Signed   By: Burman Nieves M.D.   On: 12/23/2022 01:04     LOS: 5 days   Total time spent in review of labs and imaging, patient evaluation, formulation of plan, documentation and communication with family: 35 minutes  Lanae Boast, MD [facility]  12/24/2022, 9:20 AM

## 2022-12-25 DIAGNOSIS — N179 Acute kidney failure, unspecified: Secondary | ICD-10-CM | POA: Diagnosis not present

## 2022-12-25 DIAGNOSIS — N184 Chronic kidney disease, stage 4 (severe): Secondary | ICD-10-CM | POA: Diagnosis not present

## 2022-12-25 LAB — ABO/RH: ABO/RH(D): O NEG

## 2022-12-25 LAB — CBC
HCT: 20.8 % — ABNORMAL LOW (ref 36.0–46.0)
Hemoglobin: 6.9 g/dL — CL (ref 12.0–15.0)
MCH: 32.1 pg (ref 26.0–34.0)
MCHC: 33.2 g/dL (ref 30.0–36.0)
MCV: 96.7 fL (ref 80.0–100.0)
Platelets: 205 10*3/uL (ref 150–400)
RBC: 2.15 MIL/uL — ABNORMAL LOW (ref 3.87–5.11)
RDW: 12 % (ref 11.5–15.5)
WBC: 7.3 10*3/uL (ref 4.0–10.5)
nRBC: 0 % (ref 0.0–0.2)

## 2022-12-25 LAB — BASIC METABOLIC PANEL
Anion gap: 7 (ref 5–15)
BUN: 25 mg/dL — ABNORMAL HIGH (ref 8–23)
CO2: 22 mmol/L (ref 22–32)
Calcium: 7.3 mg/dL — ABNORMAL LOW (ref 8.9–10.3)
Chloride: 107 mmol/L (ref 98–111)
Creatinine, Ser: 3.13 mg/dL — ABNORMAL HIGH (ref 0.44–1.00)
GFR, Estimated: 14 mL/min — ABNORMAL LOW (ref >60)
Glucose, Bld: 110 mg/dL — ABNORMAL HIGH (ref 70–99)
Potassium: 3.4 mmol/L — ABNORMAL LOW (ref 3.5–5.1)
Sodium: 136 mmol/L (ref 135–145)

## 2022-12-25 LAB — HEMOGLOBIN AND HEMATOCRIT, BLOOD
HCT: 24.6 % — ABNORMAL LOW (ref 36.0–46.0)
Hemoglobin: 7.9 g/dL — ABNORMAL LOW (ref 12.0–15.0)

## 2022-12-25 LAB — GLUCOSE, CAPILLARY
Glucose-Capillary: 123 mg/dL — ABNORMAL HIGH (ref 70–99)
Glucose-Capillary: 169 mg/dL — ABNORMAL HIGH (ref 70–99)
Glucose-Capillary: 339 mg/dL — ABNORMAL HIGH (ref 70–99)
Glucose-Capillary: 92 mg/dL (ref 70–99)

## 2022-12-25 LAB — PREPARE RBC (CROSSMATCH)

## 2022-12-25 MED ORDER — SODIUM CHLORIDE 0.9% IV SOLUTION: Freq: Once | INTRAVENOUS | Status: DC

## 2022-12-25 MED ORDER — POTASSIUM CHLORIDE CRYS ER 20 MEQ PO TBCR
30.0000 meq | EXTENDED_RELEASE_TABLET | Freq: Once | ORAL | Status: AC
  Administered 2022-12-25: 30 meq via ORAL
  Filled 2022-12-25: qty 1

## 2022-12-25 NOTE — Progress Notes (Signed)
  Patient's nurse reported that lab work showed hemoglobin 6.9.  Per chart review patient has chronic macrocytic anemia hemoglobin around 7-8.  -Patient does not have any active source of bleeding at this time.  She is hemodynamically stable.  Spoke with patient and she is giving consent for blood transfusion. -Preparing 2 units of blood and transfusing 1 unit of blood now.  Continue to monitor H&H and transfuse as needed to keep hemoglobin above 7. - Checking FOBT and anemia panel.  Tereasa Coop, MD [facility] 12/25/2022, 6:51 AM

## 2022-12-25 NOTE — Progress Notes (Signed)
Spoke with patient and patient's daughter, [name] concerning the patient's blood transfusion this morning due to her hemoglobin level showing 6.9. Patient was unsure about receiving the transfusion, the patient and daughter did not see the need due to her hbg level normally ranging from 7-8. Patient stated, "Why can't we just recheck the level and see if it has gotten better" Informed MD of this information/refusal. MD ordered H&H draw. Hbg currently at a range of 7.9 @0901    Everette Rank, LPN

## 2022-12-25 NOTE — Progress Notes (Signed)
PROGRESS NOTE Sandra [name]  UJW:[id number] DOB: April 27, 1933 DOA: 12/19/2022 PCP: Sandra Bookman, NP  Brief Narrative/Hospital Course: 24 yof w/ DM2, HTN, HLD, CKD 4 b/l ~ 2.3, TIA, anxiety/depression, GERD who was seen in the ED for abdominal pain, diarrhea.CT abdomen showed acute uncomplicated colitis involving the descending colon and proximal sigmoid colon. She did not meet criteria for admission and was discharged on a course of ciprofloxacin and Flagyl. Despite continuation of antibiotics,patient continued with nausea, vomiting, poor oral intake and generalized weakness and hence returned to ED on 11/30 again. In the ED,patient was afebrile,HR 60s,BP 130s, RA. Labs with WBC- 8,HB 8.8, creatinine significant elevated to 58/5 compared to a baseline of 32/2.74 last week. Started on IV fluid and admitted for further management.  Subjective: Patient seen examined this morning Overnight afebrile, BP stable Labs showed mild hypokalemia hemoglobin down at 6.9-no active bleeding however transfusion was ordered but patient and her daughter wants to hold off on transfusion for now and rechecking labs  Creatinine about the same 3.1  Assessment and Plan: Principal Problem:   Acute renal failure superimposed on chronic kidney disease (HCC) Active Problems:   Malnutrition of moderate degree  AKI on CKD 4 Acute on chronic  metabolic acidosis AKI prerenal in the setting of dehydration poor oral intake b/l creat 2.1, In Nov 30, 2022 at [facility] kidney office. PTA on prn lasix lisinopril and on hold.Bladder scan and renal ultrasound ruled out obstructive uropathy.  Received IV fluids initially with improvement from 5-now holding in 3 range. She needed lasix for shortness of breath 20mg  x 2 with Bnp in 500s on 12/4. I had discussed with Nephro 12/5-reviewed her recent labs of 2.1 and plan is to watch her closely without Lasix or IV fluids for 1-2 day and monitor.  Continue oral bicarb encourage oral  hydration  Recent Labs    06/08/22 0808 07/16/22 0010 08/21/22 1613 12/13/22 1323 12/19/22 1311 12/19/22 2019 12/20/22 0530 12/21/22 0750 12/22/22 0606 12/23/22 0654 12/24/22 0615 12/25/22 0535  BUN 40* 40* 47* 32* 58*  --  53* 45* 39* 29* 26* 25*  CREATININE 1.83* 1.64* 2.30* 2.74* 5.00* 4.26* 4.21* 4.11* 3.62* 3.04* 3.12* 3.13*  CO2 23 17* 22 19*  --   --  16* 17* 16* 18* 23 22   Hx of G1DD, lvef 60-65% 12/2021: PTA on lasix 20 mg prn.  Abnormal urinalysis Urinalysis obtained on admission showed clear yellow urine with trace hemoglobin, trace leukocytes and many bacteria.  No evidence of infection  Recent diagnosis of colitis CT abdomen on 11/24 showed uncomplicated colitis of descending colon proximal sigmoid colon.   Was started on an outpatient course of ciprofloxacin and Flagyl.  Unable to tolerate few days at home because of nausea and vomiting and she completed course antibiotics on 12/2.  Currently no GI symptoms.  Received MiraLAX 12/3 had BM 12/4  Chronic macrocytic anemia w/ b12 insufficiency anemia: B/l hb  ~ 8  here.Level fluctuating in 7-8.Anemia panel showed vitamin B12 level on the low side of normal started on oral vitamin B12 supplement. Hb 6.9 overnight 1 unit PRBC ordered-patient and her daughter are concerned about transfusion rechecked H&H  amd up at 7.9 gm will hold off on transfusion.  Hemoccult pending-of note she is on aspirin Plavix. Recent Labs    12/21/22 0750 12/22/22 0606 12/23/22 0654 12/24/22 0615 12/25/22 0535 12/25/22 0901  HGB 8.0* 8.0* 8.3* 7.7* 6.9* 7.9*  MCV 100.4* 98.4 97.7 96.4 96.7  --  VITAMINB12 238  --   --   --   --   --   FOLATE 7.9  --   --   --   --   --   FERRITIN 192  --   --   --   --   --   TIBC 196*  --   --   --   --   --   IRON 111  --   --   --   --   --   RETICCTPCT 2.0  --   --   --   --   --     Type 2 diabetes mellitus Hypoglycemia/hyperglycemia A1c 8.2 on 11/30/2022 Poorly controlled on admission.Had  episode of hypoglycemia due to glimepiride in the setting of AKI CKD and discontinued.  Keeping on sliding scale insulin only -overall stable. Apears to have brittle diabetes Recent Labs  Lab 12/24/22 0745 12/24/22 1137 12/24/22 1541 12/24/22 2223 12/25/22 0809  GLUCAP 112* 151* 253* 131* 123*    Hyperlipidemia On Lipitor   GERD Continue home Pepcid   Hypertension PTA meds- Toprol 50 mg daily, amlodipine 5 mg daily, lisinopril 5 mg daily, Lasix 20 mg daily as needed BP fairly stable continue home Toprol 50 and cont amlodipine 5, monitor   Chronic anxiety/depression Frequent panic attacks: Continue home Celexa and prn clonazepam.  She does have baseline anxiety and so does daughter   Thrombocytopenia: Resolved.    Generalized weakness Debility deconditioning: Remains weak and frail, continue PT OT fall precaution recommending home health PT  Nutrition Problem: Moderate Malnutrition: RD following augment nutritional status Etiology: acute illness Signs/Symptoms: estimated needs, moderate fat depletion, moderate muscle depletion Interventions: Boost Breeze, MVI  DVT prophylaxis: heparin injection 5,000 Units Start: 12/19/22 2200 Code Status:   Code Status: Limited: Do not attempt resuscitation (DNR) -DNR-LIMITED -Do Not Intubate/DNI  Family Communication: plan of care discussed with patient/none  at bedside/called and updated daughter 12/5, no family at bedside.  Patient status is:  INPATIENT  because of AKI. Level of care: Med-Surg   Dispo: The patient is from: home, daughter lives with her            Anticipated disposition: TBD pending clinical improvement  Objective: Vitals last 24 hrs: Vitals:   12/24/22 1539 12/24/22 2028 12/25/22 0500 12/25/22 0810  BP: (!) 139/48 (!) 154/60  (!) 155/56  Pulse: 78 94  73  Resp: 20 17  18   Temp: 99.2 F (37.3 C) 97.9 F (36.6 C)  98.1 F (36.7 C)  TempSrc: Oral Oral  Oral  SpO2: 93% 97%  96%  Weight:   58.8 kg   Height:        Weight change: -2.9 kg  Physical Examination: General exam: alert awake, oriented pleasant HEENT:Oral mucosa moist, Ear/Nose WNL grossly Respiratory system: Bilaterally clear BS,no use of accessory muscle Cardiovascular system: S1 & S2 +, No JVD. Gastrointestinal system: Abdomen soft,NT,ND, BS+ Nervous System: Alert, awake, moving all extremities,and following commands. Extremities: LE edema neg,distal peripheral pulses palpable and warm.  Skin: No rashes,no icterus. MSK: Normal muscle bulk,tone, power   Medications reviewed:  Scheduled Meds:  sodium chloride   Intravenous Once   amLODipine  5 mg Oral Daily   aspirin EC  81 mg Oral Daily   atorvastatin  80 mg Oral QPM   citalopram  10 mg Oral Daily   clopidogrel  75 mg Oral Daily   vitamin B-12  1,000 mcg Oral Daily   famotidine  20 mg Oral QHS   feeding supplement  1 Container Oral TID BM   feeding supplement  237 mL Oral BID BM   heparin  5,000 Units Subcutaneous Q8H   insulin aspart  0-5 Units Subcutaneous QHS   insulin aspart  0-9 Units Subcutaneous TID WC   metoprolol succinate  50 mg Oral Daily   multivitamin with minerals  1 tablet Oral Daily   polyethylene glycol  17 g Oral Once   potassium chloride  30 mEq Oral Once   sodium bicarbonate  1,300 mg Oral TID   Continuous Infusions:    Diet Order             Diet regular Room service appropriate? Yes; Fluid consistency: Thin  Diet effective now                   No intake or output data in the 24 hours ending 12/25/22 1044  Net IO Since Admission: 5,775.3 mL [12/25/22 1044]  Wt Readings from Last 3 Encounters:  12/25/22 58.8 kg  12/13/22 54.2 kg  11/30/22 54.2 kg     Unresulted Labs (From admission, onward)     Start     Ordered   12/25/22 0652  Occult blood card to lab, stool  Once,   R        12/25/22 0651   12/24/22 0500  Basic metabolic panel  Daily,   R     Question:  Specimen collection method  Answer:  Lab=Lab collect   12/23/22 1128    12/24/22 0500  CBC  Daily,   R     Question:  Specimen collection method  Answer:  Lab=Lab collect   12/23/22 1128           Data Reviewed: I have personally reviewed following labs and imaging studies CBC: Recent Labs  Lab 12/21/22 0750 12/22/22 0606 12/23/22 0654 12/24/22 0615 12/25/22 0535 12/25/22 0901  WBC 6.0 7.2 7.1 8.0 7.3  --   NEUTROABS 3.6 5.3 4.6  --   --   --   HGB 8.0* 8.0* 8.3* 7.7* 6.9* 7.9*  HCT 25.0* 24.0* 25.0* 23.9* 20.8* 24.6*  MCV 100.4* 98.4 97.7 96.4 96.7  --   PLT 239 226 146* 224 205  --    Basic Metabolic Panel:  Recent Labs  Lab 12/20/22 0530 12/21/22 0750 12/22/22 0606 12/23/22 0654 12/24/22 0615 12/25/22 0535  NA 141 143 140 141 140 136  K 4.3 3.9 3.6 3.5 3.3* 3.4*  CL 119* 118* 116* 116* 107 107  CO2 16* 17* 16* 18* 23 22  GLUCOSE 177* 61* 79 110* 110* 110*  BUN 53* 45* 39* 29* 26* 25*  CREATININE 4.21* 4.11* 3.62* 3.04* 3.12* 3.13*  CALCIUM 7.9* 8.2* 7.6* 7.6* 7.9* 7.3*  PHOS 5.7*  --   --   --   --   --    GFR: Estimated Creatinine Clearance: 10.3 mL/min (A) (by C-G formula based on SCr of 3.13 mg/dL (H)). Liver Function Tests:  Recent Labs  Lab 12/20/22 0530  ALBUMIN 2.7*   Recent Labs  Lab 12/19/22 1255  LIPASE 54*   No results for input(s): "AMMONIA" in the last 168 hours. Coagulation Profile: No results for input(s): "INR", "PROTIME" in the last 168 hours. No results for input(s): "PROBNP" in the last 168 hours.  No results for input(s): "HGBA1C" in the last 72 hours. Recent Labs  Lab 12/24/22 0745 12/24/22 1137 12/24/22 1541 12/24/22 2223 12/25/22 0809  GLUCAP 112* 151* 253* 131* 123*   No results for input(s): "CHOL", "HDL", "LDLCALC", "TRIG", "CHOLHDL", "LDLDIRECT" in the last 72 hours. No results for input(s): "TSH", "T4TOTAL", "FREET4", "T3FREE", "THYROIDAB" in the last 72 hours. Sepsis Labs: Recent Labs  Lab 12/19/22 1529 12/19/22 2019  LATICACIDVEN 0.8 1.2    No results found for this or any  previous visit (from the past 240 hour(s)).  Antimicrobials: Anti-infectives (From admission, onward)    Start     Dose/Rate Route Frequency Ordered Stop   12/20/22 0030  cefTRIAXone (ROCEPHIN) 2 g in sodium chloride 0.9 % 100 mL IVPB        2 g 200 mL/hr over 30 Minutes Intravenous Daily at bedtime 12/20/22 0021 12/20/22 2243   12/20/22 0030  metroNIDAZOLE (FLAGYL) IVPB 500 mg        500 mg 100 mL/hr over 60 Minutes Intravenous 2 times daily 12/20/22 0021 12/21/22 1028      Culture/Microbiology    Component Value Date/Time   SDES URINE, CATHETERIZED 06/23/2019 0318   SPECREQUEST  06/23/2019 0318    NONE Performed at [facility], [address] [place], [place] [address]    CULT MULTIPLE SPECIES PRESENT, SUGGEST RECOLLECTION (A) 06/23/2019 0318   REPTSTATUS 06/24/2019 FINAL 06/23/2019 0318    Other culture-see note  Radiology Studies: No results found.   LOS: 6 days   Total time spent in review of labs and imaging, patient evaluation, formulation of plan, documentation and communication with family: 35 minutes  Lanae Boast, MD [facility]  12/25/2022, 10:44 AM

## 2022-12-25 NOTE — Progress Notes (Signed)
Mobility Specialist Progress Note:    12/25/22 1135  Mobility  Activity Ambulated with assistance in hallway  Level of Assistance Contact guard assist, steadying assist  Assistive Device None  Distance Ambulated (ft) 150 ft  Activity Response Tolerated well  Mobility Referral Yes  Mobility visit 1 Mobility  Mobility Specialist Start Time (ACUTE ONLY) 1120  Mobility Specialist Stop Time (ACUTE ONLY) 1130  Mobility Specialist Time Calculation (min) (ACUTE ONLY) 10 min   Pt received in chair, eager for mobility session. Ambulated in hallway with CGA for safety. Tolerated well, audible SOB near EOS, SpO2 94% on RA.  Returned pt to bed, all needs met, call bell in reach, alarm on.   Feliciana Rossetti Mobility Specialist Please contact via Special educational needs teacher or  Rehab office at [phone]

## 2022-12-26 DIAGNOSIS — N184 Chronic kidney disease, stage 4 (severe): Secondary | ICD-10-CM | POA: Diagnosis not present

## 2022-12-26 DIAGNOSIS — N179 Acute kidney failure, unspecified: Secondary | ICD-10-CM | POA: Diagnosis not present

## 2022-12-26 LAB — BASIC METABOLIC PANEL
Anion gap: 10 (ref 5–15)
BUN: 29 mg/dL — ABNORMAL HIGH (ref 8–23)
CO2: 22 mmol/L (ref 22–32)
Calcium: 7.7 mg/dL — ABNORMAL LOW (ref 8.9–10.3)
Chloride: 109 mmol/L (ref 98–111)
Creatinine, Ser: 3.18 mg/dL — ABNORMAL HIGH (ref 0.44–1.00)
GFR, Estimated: 14 mL/min — ABNORMAL LOW (ref >60)
Glucose, Bld: 129 mg/dL — ABNORMAL HIGH (ref 70–99)
Potassium: 4.6 mmol/L (ref 3.5–5.1)
Sodium: 141 mmol/L (ref 135–145)

## 2022-12-26 LAB — GLUCOSE, CAPILLARY
Glucose-Capillary: 126 mg/dL — ABNORMAL HIGH (ref 70–99)
Glucose-Capillary: 153 mg/dL — ABNORMAL HIGH (ref 70–99)
Glucose-Capillary: 170 mg/dL — ABNORMAL HIGH (ref 70–99)
Glucose-Capillary: 254 mg/dL — ABNORMAL HIGH (ref 70–99)

## 2022-12-26 LAB — CBC
HCT: 22.6 % — ABNORMAL LOW (ref 36.0–46.0)
Hemoglobin: 7.5 g/dL — ABNORMAL LOW (ref 12.0–15.0)
MCH: 32.3 pg (ref 26.0–34.0)
MCHC: 33.2 g/dL (ref 30.0–36.0)
MCV: 97.4 fL (ref 80.0–100.0)
Platelets: 208 10*3/uL (ref 150–400)
RBC: 2.32 MIL/uL — ABNORMAL LOW (ref 3.87–5.11)
RDW: 12 % (ref 11.5–15.5)
WBC: 7.6 10*3/uL (ref 4.0–10.5)
nRBC: 0 % (ref 0.0–0.2)

## 2022-12-26 LAB — OCCULT BLOOD X 1 CARD TO LAB, STOOL: Fecal Occult Bld: NEGATIVE

## 2022-12-26 MED ORDER — GLIPIZIDE 2.5 MG HALF TABLET
2.5000 mg | ORAL_TABLET | Freq: Every day | ORAL | Status: DC
  Filled 2022-12-26: qty 1

## 2022-12-26 MED ORDER — GLIPIZIDE 5 MG PO TABS
2.5000 mg | ORAL_TABLET | Freq: Every day | ORAL | Status: DC
  Administered 2022-12-26 – 2022-12-27 (×2): 2.5 mg via ORAL
  Filled 2022-12-26 (×2): qty 0.5

## 2022-12-26 MED ORDER — GLIMEPIRIDE 1 MG PO TABS
1.0000 mg | ORAL_TABLET | Freq: Every day | ORAL | Status: DC
  Filled 2022-12-26: qty 1

## 2022-12-26 NOTE — Progress Notes (Signed)
PROGRESS NOTE Sandra [name]  WUJ:[id number] DOB: [birth year] DOA: 12/19/2022 PCP: [name], NP  Brief Narrative/Hospital Course: 10 yof w/ DM2, HTN, HLD, CKD 4 b/l ~ 2.3, TIA, anxiety/depression, GERD who was seen in the ED for abdominal pain, diarrhea.CT abdomen showed acute uncomplicated colitis involving the descending colon and proximal sigmoid colon. She did not meet criteria for admission and was discharged on a course of ciprofloxacin and Flagyl. Despite continuation of antibiotics,patient continued with nausea, vomiting, poor oral intake and generalized weakness and hence returned to ED on 11/30 again. In the ED,patient was afebrile,HR 60s,BP 130s, RA. Labs with WBC- 8,HB 8.8, creatinine significant elevated to 58/5 compared to a baseline of 32/2.74 last week. Started on IV fluid and admitted for further management. Patient creatinine has significantly improved from admission value now plateaued at 3.1 and holding she is tolerating 1 no hypoglycemia no significant changes doing well she feels ready for discharge home. DISCUSSED W.[name] from nephrology-reviewed the lab work, he will arrange outpatient follow-up next week with [name] and okay for discharge Resuming amaryl at 1 mg dose to make sure she can tolerate  before discharge   Subjective: Patient seen examined this morning Overnight afebrile, BP stable Labs showed mild hypokalemia hemoglobin down at 6.9-no active bleeding however transfusion was ordered but patient and her daughter wants to hold off on transfusion for now and rechecking labs  Creatinine about the same 3.1  Assessment and Plan: Principal Problem:   Acute renal failure superimposed on chronic kidney disease (HCC) Active Problems:   Malnutrition of moderate degree  AKI on CKD 4 Acute on chronic  metabolic acidosis AKI prerenal in the setting of dehydration poor oral intake b/l creat 2.1, In Nov 30, 2022 at [facility] kidney office. PTA on prn  lasix lisinopril and on hold.Bladder scan and renal ultrasound ruled out obstructive uropathy.  Received IV fluids initially with improvement from 5-now holding in 3 range. She needed lasix for shortness of breath 20mg  x 2 with Bnp in 500s on 12/4. Patient creatinine has significantly improved from admission value now plateaued at 3.1 -discussed w/ [name] from nephrology-reviewed the lab work, he will arrange outpatient follow-up next week with [name] and okay for discharge Continue oral bicarb encourage oral hydration  Recent Labs    07/16/22 0010 08/21/22 1613 12/13/22 1323 12/19/22 1311 12/19/22 2019 12/20/22 0530 12/21/22 0750 12/22/22 0606 12/23/22 0654 12/24/22 0615 12/25/22 0535 12/26/22 0536  BUN 40* 47* 32* 58*  --  53* 45* 39* 29* 26* 25* 29*  CREATININE 1.64* 2.30* 2.74* 5.00* 4.26* 4.21* 4.11* 3.62* 3.04* 3.12* 3.13* 3.18*  CO2 17* 22 19*  --   --  16* 17* 16* 18* 23 22 22    Hx of G1DD, lvef 60-65% 12/2021: PTA on lasix 20 mg prn.  Abnormal urinalysis Urinalysis obtained on admission showed clear yellow urine with trace hemoglobin, trace leukocytes and many bacteria.  No evidence of infection  Recent diagnosis of colitis CT abdomen on 11/24 showed uncomplicated colitis of descending colon proximal sigmoid colon.   Was started on an outpatient course of ciprofloxacin and Flagyl.  Unable to tolerate few days at home because of nausea and vomiting and she completed course antibiotics on 12/2.  Currently no GI symptoms.  Received MiraLAX 12/3 had BM 12/5  Chronic macrocytic anemia w/ b12 insufficiency anemia: B/l hb  ~ 8  here.Level fluctuating in 7-8.Anemia panel showed vitamin B12 level on the low side of normal started on oral  vitamin B12 supplement. Hb 6.9 overnight 1 unit PRBC ordered-patient and her daughter are concerned about transfusion rechecked H&H stable at baseline as below.Hemoccult pending-of note she is on aspirin Plavix. Recent Labs     12/21/22 0750 12/22/22 0606 12/23/22 0654 12/24/22 0615 12/25/22 0535 12/25/22 0901 12/26/22 0536  HGB 8.0*   < > 8.3* 7.7* 6.9* 7.9* 7.5*  MCV 100.4*   < > 97.7 96.4 96.7  --  97.4  VITAMINB12 238  --   --   --   --   --   --   FOLATE 7.9  --   --   --   --   --   --   FERRITIN 192  --   --   --   --   --   --   TIBC 196*  --   --   --   --   --   --   IRON 111  --   --   --   --   --   --   RETICCTPCT 2.0  --   --   --   --   --   --    < > = values in this interval not displayed.    Type 2 diabetes mellitus Hypoglycemia/hyperglycemia A1c 8.2 on 11/30/2022 Poorly controlled on admission.Had episode of hypoglycemia due to glimepiride in the setting of AKI CKD and discontinued.   Starting OHA glipizide at low-dose 2.5 mg, discontinue sliding scale monitor 24 hours to ensure blood sugar stable no hypoglycemia before discharge. Recent Labs  Lab 12/25/22 0809 12/25/22 1152 12/25/22 1607 12/25/22 2130 12/26/22 0749  GLUCAP 123* 169* 339* 92 126*    Hyperlipidemia On Lipitor   GERD Continue home Pepcid   Hypertension PTA meds- Toprol 50 mg daily, amlodipine 5 mg daily, lisinopril 5 mg daily, Lasix 20 mg daily as needed BP fairly stable continue home Toprol 50 and cont amlodipine 5, monitor   Chronic anxiety/depression Frequent panic attacks: Continue home Celexa and prn clonazepam.  She does have baseline anxiety and so does daughter   Thrombocytopenia: Resolved.    Generalized weakness Debility deconditioning: Remains weak and frail, continue PT OT fall precaution recommending home health PT  Nutrition Problem: Moderate Malnutrition: RD following augment nutritional status Etiology: acute illness Signs/Symptoms: estimated needs, moderate fat depletion, moderate muscle depletion Interventions: Boost Breeze, MVI  DVT prophylaxis: heparin injection 5,000 Units Start: 12/19/22 2200 Code Status:   Code Status: Limited: Do not attempt resuscitation (DNR) -DNR-LIMITED  -Do Not Intubate/DNI  Family Communication: plan of care discussed with patient/none  at bedside/called and updated daughter 12/5, no family at bedside.  Patient status is:  INPATIENT  because of AKI. Level of care: Med-Surg   Dispo: The patient is from: home, daughter lives with her            Anticipated disposition: Anticipate discharge tomorrow if no significant hyperglycemia or hypoglycemia  Objective: Vitals last 24 hrs: Vitals:   12/26/22 0030 12/26/22 0432 12/26/22 0500 12/26/22 0750  BP: (!) 148/52 (!) 158/60  (!) 166/52  Pulse: 74 80  72  Resp: 18 18  16   Temp: 97.9 F (36.6 C) 98 F (36.7 C)  98.1 F (36.7 C)  TempSrc: Oral Oral  Oral  SpO2: 94% 91%  95%  Weight:   62.2 kg   Height:       Weight change: 3.4 kg  Physical Examination: General exam: alert awake, pleasant,  HEENT:Oral mucosa moist,  Ear/Nose WNL grossly Respiratory system: Bilaterally clear BS,no use of accessory muscle Cardiovascular system: S1 & S2 +, No JVD. Gastrointestinal system: Abdomen soft,NT,ND, BS+ Nervous System: Alert, awake, moving all extremities,and following commands. Extremities: LE edema neg,distal peripheral pulses palpable and warm.  Skin: No rashes,no icterus. MSK: Normal muscle bulk,tone, power   Medications reviewed:  Scheduled Meds:  sodium chloride   Intravenous Once   amLODipine  5 mg Oral Daily   aspirin EC  81 mg Oral Daily   atorvastatin  80 mg Oral QPM   citalopram  10 mg Oral Daily   clopidogrel  75 mg Oral Daily   vitamin B-12  1,000 mcg Oral Daily   famotidine  20 mg Oral QHS   feeding supplement  1 Container Oral TID BM   feeding supplement  237 mL Oral BID BM   glimepiride  1 mg Oral Q breakfast   heparin  5,000 Units Subcutaneous Q8H   metoprolol succinate  50 mg Oral Daily   multivitamin with minerals  1 tablet Oral Daily   polyethylene glycol  17 g Oral Once   sodium bicarbonate  1,300 mg Oral TID   Continuous Infusions:    Diet Order              Diet regular Room service appropriate? Yes; Fluid consistency: Thin  Diet effective now                   No intake or output data in the 24 hours ending 12/26/22 0922  Net IO Since Admission: 5,775.3 mL [12/26/22 0922]  Wt Readings from Last 3 Encounters:  12/26/22 62.2 kg  12/13/22 54.2 kg  11/30/22 54.2 kg     Unresulted Labs (From admission, onward)     Start     Ordered   12/25/22 2359  Occult blood card to lab, stool  Once,   R        12/26/22 0000   12/25/22 0652  Occult blood card to lab, stool  Once,   R        12/25/22 0651   12/24/22 0500  Basic metabolic panel  Daily,   R     Question:  Specimen collection method  Answer:  Lab=Lab collect   12/23/22 1128   12/24/22 0500  CBC  Daily,   R     Question:  Specimen collection method  Answer:  Lab=Lab collect   12/23/22 1128           Data Reviewed: I have personally reviewed following labs and imaging studies CBC: Recent Labs  Lab 12/21/22 0750 12/22/22 0606 12/23/22 0654 12/24/22 0615 12/25/22 0535 12/25/22 0901 12/26/22 0536  WBC 6.0 7.2 7.1 8.0 7.3  --  7.6  NEUTROABS 3.6 5.3 4.6  --   --   --   --   HGB 8.0* 8.0* 8.3* 7.7* 6.9* 7.9* 7.5*  HCT 25.0* 24.0* 25.0* 23.9* 20.8* 24.6* 22.6*  MCV 100.4* 98.4 97.7 96.4 96.7  --  97.4  PLT 239 226 146* 224 205  --  208   Basic Metabolic Panel:  Recent Labs  Lab 12/20/22 0530 12/21/22 0750 12/22/22 0606 12/23/22 0654 12/24/22 0615 12/25/22 0535 12/26/22 0536  NA 141   < > 140 141 140 136 141  K 4.3   < > 3.6 3.5 3.3* 3.4* 4.6  CL 119*   < > 116* 116* 107 107 109  CO2 16*   < > 16* 18* 23 22  22  GLUCOSE 177*   < > 79 110* 110* 110* 129*  BUN 53*   < > 39* 29* 26* 25* 29*  CREATININE 4.21*   < > 3.62* 3.04* 3.12* 3.13* 3.18*  CALCIUM 7.9*   < > 7.6* 7.6* 7.9* 7.3* 7.7*  PHOS 5.7*  --   --   --   --   --   --    < > = values in this interval not displayed.   GFR: Estimated Creatinine Clearance: 10.5 mL/min (A) (by C-G formula based on SCr of  3.18 mg/dL (H)). Liver Function Tests:  Recent Labs  Lab 12/20/22 0530  ALBUMIN 2.7*   Recent Labs  Lab 12/19/22 1255  LIPASE 54*   No results for input(s): "AMMONIA" in the last 168 hours. Coagulation Profile: No results for input(s): "INR", "PROTIME" in the last 168 hours. No results for input(s): "PROBNP" in the last 168 hours.  No results for input(s): "HGBA1C" in the last 72 hours. Recent Labs  Lab 12/25/22 0809 12/25/22 1152 12/25/22 1607 12/25/22 2130 12/26/22 0749  GLUCAP 123* 169* 339* 92 126*   No results for input(s): "CHOL", "HDL", "LDLCALC", "TRIG", "CHOLHDL", "LDLDIRECT" in the last 72 hours. No results for input(s): "TSH", "T4TOTAL", "FREET4", "T3FREE", "THYROIDAB" in the last 72 hours. Sepsis Labs: Recent Labs  Lab 12/19/22 1529 12/19/22 2019  LATICACIDVEN 0.8 1.2    No results found for this or any previous visit (from the past 240 hour(s)).  Antimicrobials: Anti-infectives (From admission, onward)    Start     Dose/Rate Route Frequency Ordered Stop   12/20/22 0030  cefTRIAXone (ROCEPHIN) 2 g in sodium chloride 0.9 % 100 mL IVPB        2 g 200 mL/hr over 30 Minutes Intravenous Daily at bedtime 12/20/22 0021 12/20/22 2243   12/20/22 0030  metroNIDAZOLE (FLAGYL) IVPB 500 mg        500 mg 100 mL/hr over 60 Minutes Intravenous 2 times daily 12/20/22 0021 12/21/22 1028      Culture/Microbiology    Component Value Date/Time   SDES URINE, CATHETERIZED 06/23/2019 0318   SPECREQUEST  06/23/2019 0318    NONE Performed at [facility], [address], [place], [place] [address]    CULT MULTIPLE SPECIES PRESENT, SUGGEST RECOLLECTION (A) 06/23/2019 0318   REPTSTATUS 06/24/2019 FINAL 06/23/2019 0318    Other culture-see note  Radiology Studies: No results found.   LOS: 7 days   Total time spent in review of labs and imaging, patient evaluation, formulation of plan, documentation and communication with family: 35 minutes  Lanae Boast, MD [facility]  12/26/2022, 9:22 AM

## 2022-12-27 ENCOUNTER — Inpatient Hospital Stay (HOSPITAL_COMMUNITY): Payer: Medicare HMO

## 2022-12-27 ENCOUNTER — Encounter (HOSPITAL_COMMUNITY): Payer: Self-pay | Admitting: Internal Medicine

## 2022-12-27 DIAGNOSIS — Z66 Do not resuscitate: Secondary | ICD-10-CM | POA: Diagnosis not present

## 2022-12-27 DIAGNOSIS — E1165 Type 2 diabetes mellitus with hyperglycemia: Secondary | ICD-10-CM

## 2022-12-27 DIAGNOSIS — N185 Chronic kidney disease, stage 5: Secondary | ICD-10-CM

## 2022-12-27 DIAGNOSIS — I10 Essential (primary) hypertension: Secondary | ICD-10-CM

## 2022-12-27 DIAGNOSIS — J849 Interstitial pulmonary disease, unspecified: Secondary | ICD-10-CM | POA: Diagnosis not present

## 2022-12-27 DIAGNOSIS — E440 Moderate protein-calorie malnutrition: Secondary | ICD-10-CM

## 2022-12-27 DIAGNOSIS — N179 Acute kidney failure, unspecified: Secondary | ICD-10-CM | POA: Diagnosis not present

## 2022-12-27 LAB — CBC
HCT: 24.1 % — ABNORMAL LOW (ref 36.0–46.0)
Hemoglobin: 7.8 g/dL — ABNORMAL LOW (ref 12.0–15.0)
MCH: 31.8 pg (ref 26.0–34.0)
MCHC: 32.4 g/dL (ref 30.0–36.0)
MCV: 98.4 fL (ref 80.0–100.0)
Platelets: 242 10*3/uL (ref 150–400)
RBC: 2.45 MIL/uL — ABNORMAL LOW (ref 3.87–5.11)
RDW: 11.9 % (ref 11.5–15.5)
WBC: 7.7 10*3/uL (ref 4.0–10.5)
nRBC: 0 % (ref 0.0–0.2)

## 2022-12-27 LAB — BLOOD GAS, ARTERIAL
Acid-Base Excess: 2.6 mmol/L — ABNORMAL HIGH (ref 0.0–2.0)
Bicarbonate: 27.2 mmol/L (ref 20.0–28.0)
O2 Saturation: 98.7 %
Patient temperature: 37
pCO2 arterial: 41 mm[Hg] (ref 32–48)
pH, Arterial: 7.43 (ref 7.35–7.45)
pO2, Arterial: 79 mm[Hg] — ABNORMAL LOW (ref 83–108)

## 2022-12-27 LAB — BASIC METABOLIC PANEL
Anion gap: 8 (ref 5–15)
BUN: 29 mg/dL — ABNORMAL HIGH (ref 8–23)
CO2: 26 mmol/L (ref 22–32)
Calcium: 8.2 mg/dL — ABNORMAL LOW (ref 8.9–10.3)
Chloride: 107 mmol/L (ref 98–111)
Creatinine, Ser: 3.4 mg/dL — ABNORMAL HIGH (ref 0.44–1.00)
GFR, Estimated: 13 mL/min — ABNORMAL LOW (ref >60)
Glucose, Bld: 159 mg/dL — ABNORMAL HIGH (ref 70–99)
Potassium: 3.9 mmol/L (ref 3.5–5.1)
Sodium: 141 mmol/L (ref 135–145)

## 2022-12-27 LAB — BRAIN NATRIURETIC PEPTIDE: B Natriuretic Peptide: 611.4 pg/mL — ABNORMAL HIGH (ref 0.0–100.0)

## 2022-12-27 LAB — GLUCOSE, CAPILLARY
Glucose-Capillary: 144 mg/dL — ABNORMAL HIGH (ref 70–99)
Glucose-Capillary: 264 mg/dL — ABNORMAL HIGH (ref 70–99)
Glucose-Capillary: 196 mg/dL — ABNORMAL HIGH (ref 70–99)

## 2022-12-27 MED ORDER — IPRATROPIUM-ALBUTEROL 0.5-2.5 (3) MG/3ML IN SOLN
3.0000 mL | Freq: Once | RESPIRATORY_TRACT | Status: AC
  Administered 2022-12-27: 3 mL via RESPIRATORY_TRACT
  Filled 2022-12-27: qty 3

## 2022-12-27 MED ORDER — INSULIN ASPART 100 UNIT/ML IJ SOLN: 0.0000 [IU] | Freq: Every day | INTRAMUSCULAR | Status: DC

## 2022-12-27 MED ORDER — FUROSEMIDE 10 MG/ML IJ SOLN
80.0000 mg | Freq: Once | INTRAMUSCULAR | Status: AC
  Administered 2022-12-27: 80 mg via INTRAVENOUS
  Filled 2022-12-27: qty 8

## 2022-12-27 MED ORDER — IPRATROPIUM-ALBUTEROL 0.5-2.5 (3) MG/3ML IN SOLN
3.0000 mL | Freq: Four times a day (QID) | RESPIRATORY_TRACT | Status: DC
  Administered 2022-12-27 – 2022-12-28 (×3): 3 mL via RESPIRATORY_TRACT
  Filled 2022-12-27 (×3): qty 3

## 2022-12-27 MED ORDER — INSULIN ASPART 100 UNIT/ML IJ SOLN
0.0000 [IU] | Freq: Three times a day (TID) | INTRAMUSCULAR | Status: DC
  Administered 2022-12-28: 5 [IU] via SUBCUTANEOUS
  Administered 2022-12-28: 1 [IU] via SUBCUTANEOUS
  Administered 2022-12-29: 7 [IU] via SUBCUTANEOUS

## 2022-12-27 NOTE — Assessment & Plan Note (Signed)
12-27-2022 pt made DNR on admission.

## 2022-12-27 NOTE — Assessment & Plan Note (Addendum)
12-19-2022 through 12-26-2022 AKI prerenal in the setting of dehydration poor oral intake b/l creat 2.1, In Nov 30, 2022 at [facility] kidney office. PTA on prn lasix lisinopril and on hold.Bladder scan and renal ultrasound ruled out obstructive uropathy.  Received IV fluids initially with improvement from 5-now holding in 3 range. She needed lasix for shortness of breath 20mg  x 2 with Bnp in 500s on 12/4. Patient creatinine has significantly improved from admission value now plateaued at 3.1 -discussed w/ [name] from nephrology-reviewed the lab work, he will arrange outpatient follow-up next week with [name] and okay for discharge Continue oral bicarb encourage oral hydration  12-27-2022 pt with clear respiratory distress. BNP elevated to 611 and CXR shows diffuse interstitial lung disease. However, bedside thoracic U/S with multiple B-lines consistent with pulmonary edema. Pt did urinate about 250 ml after IV lasix. Discussed with dtr [name] that aggressive diuresis cannot be used in patient due to CKD stage 5. Pt is not HD candidate. Dtr does not seem to understand that pt has CKD stage 5 and there is not much that can be done about pt's fluid retention. Asked pt not to drink too much water.  12-28-2022 pt improved with 80 mg IV lasix x 1 yesterday. Only recorded 525 ml urine yesterday. But now on RA. Scr has increased to 3.42. BUN 29.  Pt's son seems to understand that pt's CKD is severe. That she is not a HD candidate and that if she gets any worse, family will need to consider palliative care. Will monitor pt today. Repeat BMP in AM. Consider DC in AM. Told son that family needs to go out today and purchase a digital scale for pt's use at home. Pt states she already has home health RN at home.  12-29-2022 Scr has improved to 3.14, BUN 28. BNP 469. This is likely her baseline BNP. Weight yesterday 134.48 lbs. Pt will be discharged to home with prn lasix 20 mg. Take if baseline weight 3 lbs over dry  weight. Pt needs close f/u with nephrology as outpatient.

## 2022-12-27 NOTE — Assessment & Plan Note (Signed)
12-27-2022 add SSI. Stop glipizide due to CKD stage 5

## 2022-12-27 NOTE — Assessment & Plan Note (Signed)
12-27-2022 given her CKD stage 5, will stop glipizide. Place on SSI.

## 2022-12-27 NOTE — Subjective & Objective (Signed)
Pt seen and examined. Met with pt's son Onalee Hua at bedside. Reviewed that pt had CKD stage 5. She is not a HD candidate. Discussed that pt has a delicate balance between too much fluid and too little fluid. She can only be on prn lasix due to severe CKD. Pt will need to weigh herself every day. Only takes lasix if her weight is +1 lbs above her dry weight. She will need close f/u with PCP and nephrology.

## 2022-12-27 NOTE — Assessment & Plan Note (Signed)
12-27-2022 will stop norvasc as it can increase her peripheral edema. Continue with lopressor.  12-28-2022 add hydralazine 10 mg tid for additional BP control.  12-29-2022 continue with toprol-xl 50 mg daily, hydralazine 25 mg tid.

## 2022-12-27 NOTE — Progress Notes (Signed)
Mobility Specialist Progress Note:    12/27/22 1229  Mobility  Activity Ambulated with assistance in hallway  Level of Assistance Contact guard assist, steadying assist  Assistive Device None  Distance Ambulated (ft) 125 ft  Activity Response Tolerated well  Mobility Referral Yes  Mobility visit 1 Mobility  Mobility Specialist Start Time (ACUTE ONLY) 1220  Mobility Specialist Stop Time (ACUTE ONLY) 1229  Mobility Specialist Time Calculation (min) (ACUTE ONLY) 9 min   Pt required min encouragement from RN, family, and MS to participate in mobility session. Ambulated in hallway with CGA. Monitored O2 throughout, c/o SOB increased O2 flow from 2L-3L. Returned pt to room w/o fault, agreeable to sit in chair. All needs met, call bell in reach.   Pre Mobility: SpO2 96% on 2.5L  During Mobility: SpO2 87% on 2L, attempted pursed lip breathing, had to increase O2 flow, SpO2 91% on 3L  Post Mobility: Spo2 93% on 2.5L, sitting in chair.   Feliciana Rossetti Mobility Specialist Please contact via Special educational needs teacher or  Rehab office at [phone]

## 2022-12-27 NOTE — Assessment & Plan Note (Signed)
12-27-2022. See RD noted from 12-22-2022. Moderate Malnutrition related to acute illness as evidenced by estimated needs, moderate fat depletion, moderate muscle depletion.

## 2022-12-27 NOTE — Progress Notes (Signed)
PROGRESS NOTE    Sandra [name]  RUE:[id number] DOB: 03/10/1933 DOA: 12/19/2022 PCP: [name], NP  Subjective: Late entry: Pt initially seen around 8 AM. Called by RN to come to bedside due to respiratory distress. Met with bedside RN and charge RN.  Bedside thoracic ultrasound performed. Lots of B-lines consistent with pulmonary edema.  RT had difficulty obtaining ABG  Pt with audible wheezing and visible respiratory distress.  Orders written for IV lasix 80 mg once and CXR.  Later this afternoon, after IV lasix and duoneb, pt much more relaxed. Discussed at bedside with pt's dtr [name]. Discussed that pt has CKD stage 5. She is not a HD candidate.   Hospital Course: HPI: 50 yof w/ DM2, HTN, HLD, CKD 4 b/l ~ 2.3, TIA, anxiety/depression, GERD who was seen in the ED for abdominal pain, diarrhea.CT abdomen showed acute uncomplicated colitis involving the descending colon and proximal sigmoid colon. She did not meet criteria for admission and was discharged on a course of ciprofloxacin and Flagyl. Despite continuation of antibiotics,patient continued with nausea, vomiting, poor oral intake and generalized weakness and hence returned to ED on 11/30 again. In the ED,patient was afebrile,HR 60s,BP 130s, RA. Labs with WBC- 8,HB 8.8, creatinine significant elevated to 58/5 compared to a baseline of 32/2.74 last week. Started on IV fluid and admitted for further management. Resuming amaryl at 1 mg dose to make sure she can tolerate  before discharge   Significant Events: Admitted 12/19/2022 acute on chronic kidney disease stage 4   Significant Labs: Admission WBC 8, Hgb 8.8, Na 141, K 4.3, bicarb 16, BUN 53, Scr 4.21 B12 238(nl) Folate 7.9(nl) Iron 111(nl), TIBC 196(low), %sat 57 (high), ferritin 192(nl)  Significant Imaging Studies: Admission Renal U/S shows Normal ultrasound appearance of the kidneys. No mass or hydronephrosis identified.  Antibiotic  Therapy: Anti-infectives (From admission, onward)    Start     Dose/Rate Route Frequency Ordered Stop   12/20/22 0030  cefTRIAXone (ROCEPHIN) 2 g in sodium chloride 0.9 % 100 mL IVPB        2 g 200 mL/hr over 30 Minutes Intravenous Daily at bedtime 12/20/22 0021 12/20/22 2243   12/20/22 0030  metroNIDAZOLE (FLAGYL) IVPB 500 mg        500 mg 100 mL/hr over 60 Minutes Intravenous 2 times daily 12/20/22 0021 12/21/22 1028       Procedures:   Consultants:     Assessment and Plan: * Acute renal failure superimposed on stage 5 chronic kidney disease, not on chronic dialysis (HCC) 12-19-2022 through 12-26-2022 AKI prerenal in the setting of dehydration poor oral intake b/l creat 2.1, In Nov 30, 2022 at [facility] kidney office. PTA on prn lasix lisinopril and on hold.Bladder scan and renal ultrasound ruled out obstructive uropathy.  Received IV fluids initially with improvement from 5-now holding in 3 range. She needed lasix for shortness of breath 20mg  x 2 with Bnp in 500s on 12/4. Patient creatinine has significantly improved from admission value now plateaued at 3.1 -discussed w/ [name] from nephrology-reviewed the lab work, he will arrange outpatient follow-up next week with [name] and okay for discharge Continue oral bicarb encourage oral hydration  12-27-2022 pt with clear respiratory distress. BNP elevated to 611 and CXR shows diffuse interstitial lung disease. However, bedside thoracic U/S with multiple B-lines consistent with pulmonary edema. Pt did urinate about 250 ml after IV lasix. Discussed with dtr [name] that aggressive diuresis cannot be used in patient due to CKD  stage 5. Pt is not HD candidate. Dtr does not seem to understand that pt has CKD stage 5 and there is not much that can be done about pt's fluid retention. Asked pt not to drink too much water.  Malnutrition of moderate degree 12-27-2022. See RD noted from 12-22-2022. Moderate Malnutrition related to acute illness as  evidenced by estimated needs, moderate fat depletion, moderate muscle depletion.   DNR (do not resuscitate) 12-27-2022 pt made DNR on admission.   Uncontrolled type 2 diabetes mellitus with hyperglycemia (HCC) 12-27-2022 add SSI. Stop glipizide due to CKD stage 5  ILD (interstitial lung disease) (HCC) 12-27-2022 chronic. Mild pulmonary edema. Diuresis today with 1 dose of IV lasix. Continue with duonebs qid.  Type 2 diabetes mellitus with hyperglycemia (HCC) 12-27-2022 given her CKD stage 5, will stop glipizide. Place on SSI.  Essential hypertension 12-27-2022 will stop norvasc as it can increase her peripheral edema. Continue with lopressor.   DVT prophylaxis: heparin injection 5,000 Units Start: 12/19/22 2200    Code Status: Limited: Do not attempt resuscitation (DNR) -DNR-LIMITED -Do Not Intubate/DNI  Family Communication: discussed at bedside with dtr [name] Disposition Plan: return home Reason for continuing need for hospitalization: monitoring pt's breathing and urine output.  Objective: Vitals:   12/27/22 0741 12/27/22 0751 12/27/22 0806 12/27/22 1441  BP: (!) 169/58  (!) 152/66 (!) 141/85  Pulse: 72  84 68  Resp: 17 (!) 24  17  Temp:      TempSrc:      SpO2: 92%  97% 93%  Weight:      Height:       No intake or output data in the 24 hours ending 12/27/22 1623 Filed Weights   12/25/22 0500 12/26/22 0500 12/27/22 0500  Weight: 58.8 kg 62.2 kg 63 kg    Examination:  Physical Exam Vitals and nursing note reviewed.  Constitutional:      General: She is in acute distress.     Appearance: She is ill-appearing.     Comments: Chronically ill appearing  HENT:     Head: Normocephalic and atraumatic.  Eyes:     General: No scleral icterus. Cardiovascular:     Rate and Rhythm: Normal rate and regular rhythm.  Pulmonary:     Breath sounds: Wheezing present.     Comments: Audible wheezing Bedside thoracic U/S shows multiple B-lines consistent with pulmonary  edema    Data Reviewed: I have personally reviewed following labs and imaging studies  CBC: Recent Labs  Lab 12/21/22 0750 12/22/22 0606 12/23/22 0654 12/24/22 0615 12/25/22 0535 12/25/22 0901 12/26/22 0536 12/27/22 0725  WBC 6.0 7.2 7.1 8.0 7.3  --  7.6 7.7  NEUTROABS 3.6 5.3 4.6  --   --   --   --   --   HGB 8.0* 8.0* 8.3* 7.7* 6.9* 7.9* 7.5* 7.8*  HCT 25.0* 24.0* 25.0* 23.9* 20.8* 24.6* 22.6* 24.1*  MCV 100.4* 98.4 97.7 96.4 96.7  --  97.4 98.4  PLT 239 226 146* 224 205  --  208 242   Basic Metabolic Panel: Recent Labs  Lab 12/23/22 0654 12/24/22 0615 12/25/22 0535 12/26/22 0536 12/27/22 0725  NA 141 140 136 141 141  K 3.5 3.3* 3.4* 4.6 3.9  CL 116* 107 107 109 107  CO2 18* 23 22 22 26   GLUCOSE 110* 110* 110* 129* 159*  BUN 29* 26* 25* 29* 29*  CREATININE 3.04* 3.12* 3.13* 3.18* 3.40*  CALCIUM 7.6* 7.9* 7.3* 7.7* 8.2*  GFR: Estimated Creatinine Clearance: 9.8 mL/min (A) (by C-G formula based on SCr of 3.4 mg/dL (H)). BNP (last 3 results) Recent Labs    12/23/22 0654 12/27/22 0725  BNP 549.7* 611.4*   CBG: Recent Labs  Lab 12/26/22 1230 12/26/22 1624 12/26/22 1954 12/27/22 0739 12/27/22 1614  GLUCAP 153* 170* 254* 144* 264*   Radiology Studies: DG CHEST PORT 1 VIEW  Result Date: 12/27/2022 CLINICAL DATA:  Respiratory distress. EXAM: PORTABLE CHEST 1 VIEW COMPARISON:  12/23/2022 FINDINGS: Low volume film. Cardiopericardial silhouette is at upper limits of normal for size. Diffuse interstitial lung disease again noted with bibasilar atelectasis or infiltrate. No substantial pleural effusion. No acute bony abnormality. IMPRESSION: Low volume film with diffuse interstitial lung disease and bibasilar atelectasis or infiltrate. Electronically Signed   By: Kennith Center M.D.   On: 12/27/2022 08:53    Scheduled Meds:  aspirin EC  81 mg Oral Daily   atorvastatin  80 mg Oral QPM   citalopram  10 mg Oral Daily   clopidogrel  75 mg Oral Daily   vitamin  B-12  1,000 mcg Oral Daily   famotidine  20 mg Oral QHS   feeding supplement  1 Container Oral TID BM   feeding supplement  237 mL Oral BID BM   heparin  5,000 Units Subcutaneous Q8H   insulin aspart  0-5 Units Subcutaneous QHS   [START ON 12/28/2022] insulin aspart  0-9 Units Subcutaneous TID WC   ipratropium-albuterol  3 mL Nebulization QID   metoprolol succinate  50 mg Oral Daily   sodium bicarbonate  1,300 mg Oral TID   Continuous Infusions:   LOS: 8 days   Time spent: 60 minutes  Carollee Herter, DO  [facility]  12/27/2022, 4:23 PM

## 2022-12-27 NOTE — Assessment & Plan Note (Signed)
12-27-2022 chronic. Mild pulmonary edema. Diuresis today with 1 dose of IV lasix. Continue with duonebs qid.  12-29-2022 reviewed her chart. Pt previously seen by pulmonology in 2021. " Patient has underlying interstitial lung disease.  Felt to have progressive fibrosis with increased symptom burden.  She was started on Ofev November 2020.  Patient says she has been unable to tolerate this.  She has been taking but it has caused significant diarrhea.  Last visit Ofev dose was decreased to 100 mg twice daily.  Patient says she did not pick this up and does not plan on taking this again.  She has had very poor quality of life and does not wish to restart this.  Previous visit patient was recommended to also consider CellCept along with Bactrim.  We discussed this and other treatment options.  Patient says she does not like the side effects and currently prefers quality of life versus suffering from medication side effects".  Appears that pt had been lost to followup since 2021. Pt never went back to see pulmonology in regards to her ILD. It appears that her ILD has progressed since she voluntarily decided to stop treatment 3 years ago. Appears she is paying the price now for that decision. Unfortunately, not much can be done about it now. Will try and get her home O2. She would benefit from outpatient palliative care consult as I predict she is only going to get sicker and sicker in the future.

## 2022-12-28 ENCOUNTER — Inpatient Hospital Stay (HOSPITAL_COMMUNITY): Payer: Medicare HMO

## 2022-12-28 ENCOUNTER — Other Ambulatory Visit: Payer: Medicare HMO

## 2022-12-28 DIAGNOSIS — E1169 Type 2 diabetes mellitus with other specified complication: Secondary | ICD-10-CM

## 2022-12-28 DIAGNOSIS — E785 Hyperlipidemia, unspecified: Secondary | ICD-10-CM

## 2022-12-28 DIAGNOSIS — J849 Interstitial pulmonary disease, unspecified: Secondary | ICD-10-CM | POA: Diagnosis not present

## 2022-12-28 DIAGNOSIS — I10 Essential (primary) hypertension: Secondary | ICD-10-CM | POA: Diagnosis not present

## 2022-12-28 DIAGNOSIS — I639 Cerebral infarction, unspecified: Secondary | ICD-10-CM

## 2022-12-28 DIAGNOSIS — N179 Acute kidney failure, unspecified: Secondary | ICD-10-CM | POA: Diagnosis not present

## 2022-12-28 LAB — COMPREHENSIVE METABOLIC PANEL
ALT: 14 U/L (ref 0–44)
AST: 18 U/L (ref 15–41)
Albumin: 2.7 g/dL — ABNORMAL LOW (ref 3.5–5.0)
Alkaline Phosphatase: 52 U/L (ref 38–126)
Anion gap: 9 (ref 5–15)
BUN: 29 mg/dL — ABNORMAL HIGH (ref 8–23)
CO2: 25 mmol/L (ref 22–32)
Calcium: 7.9 mg/dL — ABNORMAL LOW (ref 8.9–10.3)
Chloride: 106 mmol/L (ref 98–111)
Creatinine, Ser: 3.42 mg/dL — ABNORMAL HIGH (ref 0.44–1.00)
GFR, Estimated: 13 mL/min — ABNORMAL LOW (ref >60)
Glucose, Bld: 153 mg/dL — ABNORMAL HIGH (ref 70–99)
Potassium: 4 mmol/L (ref 3.5–5.1)
Sodium: 140 mmol/L (ref 135–145)
Total Bilirubin: 0.5 mg/dL (ref <1.2)
Total Protein: 5.5 g/dL — ABNORMAL LOW (ref 6.5–8.1)

## 2022-12-28 LAB — MAGNESIUM: Magnesium: 1.3 mg/dL — ABNORMAL LOW (ref 1.7–2.4)

## 2022-12-28 LAB — GLUCOSE, CAPILLARY
Glucose-Capillary: 146 mg/dL — ABNORMAL HIGH (ref 70–99)
Glucose-Capillary: 291 mg/dL — ABNORMAL HIGH (ref 70–99)
Glucose-Capillary: 147 mg/dL — ABNORMAL HIGH (ref 70–99)
Glucose-Capillary: 112 mg/dL — ABNORMAL HIGH (ref 70–99)

## 2022-12-28 LAB — BRAIN NATRIURETIC PEPTIDE: B Natriuretic Peptide: 509.4 pg/mL — ABNORMAL HIGH (ref 0.0–100.0)

## 2022-12-28 MED ORDER — IPRATROPIUM-ALBUTEROL 0.5-2.5 (3) MG/3ML IN SOLN
3.0000 mL | Freq: Four times a day (QID) | RESPIRATORY_TRACT | Status: DC | PRN
  Administered 2022-12-28: 3 mL via RESPIRATORY_TRACT
  Filled 2022-12-28: qty 3

## 2022-12-28 MED ORDER — STROKE: EARLY STAGES OF RECOVERY BOOK
Freq: Once | Status: AC
  Filled 2022-12-28: qty 1

## 2022-12-28 MED ORDER — HYDRALAZINE HCL 10 MG PO TABS
10.0000 mg | ORAL_TABLET | Freq: Three times a day (TID) | ORAL | Status: DC
  Administered 2022-12-28 (×2): 10 mg via ORAL
  Filled 2022-12-28 (×2): qty 1

## 2022-12-28 NOTE — Consult Note (Signed)
NEUROLOGY CONSULT NOTE   Date of service: December 28, 2022 Patient Name: Sandra [name] MRN:  [id number] DOB:  20-Mar-1933 Chief Complaint: Acute onset of LUE paresthesia and sensation of left face drawing up Requesting Provider: Carollee Herter, DO  History of Present Illness  Sandra [name] is an 89 y.o. female with an extensive PMHx as documented below, including stroke in December of 2023 (sees Dr. Pearlean Brownie in follow up) who was admitted on 11/30 for management of ARF on CKD secondary to prerenal state. Her fluid status improved during admission and plan was to discharge home today. This evening she had sudden onset of LUE paresthesias circumferentially affecting the arm from the shoulder down, in addition to a sensation of the left side of her face drawing up. She called her RN and stated that she felt like she was having another stroke. Code Stroke was then called.    LKW: 7:30 PM Modified rankin score: 0 IV Thrombolysis:  No: Symptoms too mild to treat   NIHSS components Score: Comment  1a Level of Conscious 0[x]  1[]  2[]  3[]      1b LOC Questions 0[x]  1[]  2[]       1c LOC Commands 0[x]  1[]  2[]       2 Best Gaze 0[x]  1[]  2[]       3 Visual 0[x]  1[]  2[]  3[]      4 Facial Palsy 0[x]  1[]  2[]  3[]       5a Motor Arm - left 0[x]  1[]  2[]  3[]  4[]  UN[]    5b Motor Arm - Right 0[x]  1[]  2[]  3[]  4[]  UN[]    6a Motor Leg - Left 0[x]  1[]  2[]  3[]  4[]  UN[]    6b Motor Leg - Right 0[x]  1[]  2[]  3[]  4[]  UN[]    7 Limb Ataxia 0[]  1[]  2[x]  3[]  UN[]    BUE, mild dysmetria with tremor  8 Sensory 0[x]  1[]  2[]  UN[]      9 Best Language 0[x]  1[]  2[]  3[]      10 Dysarthria 0[x]  1[]  2[]  UN[]      11 Extinct. and Inattention 0[x]  1[]  2[]       TOTAL: 2      ROS  Comprehensive ROS performed and pertinent positives documented in HPI   Past History   Past Medical History:  Diagnosis Date   Achilles tendon injury 11/27/2010   ALLERGIC RHINITIS 05/12/2006   Qualifier: Diagnosis of  By: Drue Novel MD, Nolon Rod.    Anxiety state  07/03/2009   Qualifier: Diagnosis of  By: Drue Novel MD, Jose E.    Back pain 07/01/2010   Chronic kidney disease (CKD), stage III (moderate) (HCC) 05/31/2014   Chronic right SI joint pain 09/22/2013   DEGENERATIVE JOINT DISEASE, CERVICAL SPINE 06/23/2006   Annotation: had a CAT scan with a cervical myelogram that show  left C6-7 and  C5-6 foraminal stenosis Qualifier: Diagnosis of  By: Drue Novel MD, Nolon Rod.    DEPRESSION 05/12/2006   Qualifier: Diagnosis of  By: Drue Novel MD, Jose E.    DIABETES MELLITUS, TYPE II 05/12/2006   Now following w/ Endo at [facility]     DIABETIC  RETINOPATHY 04/27/2007   Qualifier: Diagnosis of  By: Janit Bern     Encephalopathy, hypertensive    Essential hypertension 05/12/2006   Qualifier: Diagnosis of  By: Drue Novel MD, Nolon Rod.    GAIT DISTURBANCE 08/07/2009   Qualifier: Diagnosis of  By: Drue Novel MD, Nolon Rod.    GERD (gastroesophageal reflux disease)    History of colonoscopy    History of CT scan  History of mammogram    History of MRI    Hyperlipidemia associated with type 2 diabetes mellitus (HCC) 05/12/2006   Qualifier: Diagnosis of  By: Drue Novel MD, Jose E.    ILD (interstitial lung disease) (HCC) 05/31/2015   Lobar cerebral hemorrhage (HCC) 06/22/2019   NECK PAIN, CHRONIC 04/03/2008   Qualifier: Diagnosis of  By: Drue Novel MD, Nolon Rod.    Osteoarthritis 02/10/2016   Osteopenia    Osteoporosis 06/23/2006   Annotation: had a bone density test in 08-2004.  T score was -2.4 Qualifier: Diagnosis of  By: Drue Novel MD, Nolon Rod     [facility] (subarachnoid hemorrhage) (HCC)    Scleroderma (HCC) 02/28/2016   Stroke [facility])    Takotsubo cardiomyopathy    s/p stress MI with stress induced CM with normal coronary arteries by cath 2007 with normalization of LVF by echo 04/2005   TIA (transient ischemic attack)    Vasculitis of skin 09/28/2012   Vitamin D deficiency 10/01/2014    Past Surgical History:  Procedure Laterality Date   ABDOMINAL HYSTERECTOMY  1977   no oophorectomy   APPENDECTOMY      CARDIAC CATHETERIZATION     CATARACT EXTRACTION, BILATERAL  2011   SPINAL FUSION  2000   Dr. Venetia Maxon   TONSILLECTOMY      Family History: Family History  Problem Relation Age of Onset   Stroke Mother    Cancer Sister    Lung disease Sister    Intracerebral hemorrhage Daughter        assoc with post-partum   Hypertension Daughter    Breast cancer Other        ?Aunt   Cancer Other        breast?   Coronary artery disease Neg Hx     Social History  reports that she has never smoked. She has been exposed to tobacco smoke. She has never used smokeless tobacco. She reports current alcohol use of about 1.0 standard drink of alcohol per week. She reports that she does not use drugs.  Allergies  Allergen Reactions   Cephalexin Nausea And Vomiting    Pt stated made severely sick, will never take again   Pioglitazone Swelling    REACTION: EDEMA   Nintedanib Diarrhea   Sertraline Nausea And Vomiting    Medications   Current Facility-Administered Medications:    acetaminophen (TYLENOL) tablet 500 mg, 500 mg, Oral, Q6H PRN, Steffanie Rainwater, MD, 500 mg at 12/23/22 0631   aspirin EC tablet 81 mg, 81 mg, Oral, Daily, Steffanie Rainwater, MD, 81 mg at 12/28/22 1132   atorvastatin (LIPITOR) tablet 80 mg, 80 mg, Oral, QPM, Steffanie Rainwater, MD, 80 mg at 12/28/22 1807   citalopram (CELEXA) tablet 10 mg, 10 mg, Oral, Daily, Steffanie Rainwater, MD, 10 mg at 12/28/22 1131   clonazePAM (KLONOPIN) tablet 0.5 mg, 0.5 mg, Oral, BID PRN, Steffanie Rainwater, MD, 0.5 mg at 12/28/22 1807   clopidogrel (PLAVIX) tablet 75 mg, 75 mg, Oral, Daily, Steffanie Rainwater, MD, 75 mg at 12/28/22 1131   cyanocobalamin (VITAMIN B12) tablet 1,000 mcg, 1,000 mcg, Oral, Daily, Dahal, Binaya, MD, 1,000 mcg at 12/28/22 1131   famotidine (PEPCID) tablet 20 mg, 20 mg, Oral, QHS, Amponsah, Flossie Buffy, MD, 20 mg at 12/27/22 2204   feeding supplement (BOOST / RESOURCE BREEZE) liquid 1 Container, 1 Container, Oral,  TID BM, Dahal, Melina Schools, MD, 1 Container at 12/28/22 1134   feeding supplement (ENSURE ENLIVE / ENSURE PLUS) liquid  237 mL, 237 mL, Oral, BID BM, Hall, Carole N, DO, 237 mL at 12/21/22 0914   heparin injection 5,000 Units, 5,000 Units, Subcutaneous, Q8H, Darlin Drop, DO, 5,000 Units at 12/28/22 1432   hydrALAZINE (APRESOLINE) injection 10 mg, 10 mg, Intravenous, Q4H PRN, Howerter, Justin B, DO, 10 mg at 12/23/22 1235   hydrALAZINE (APRESOLINE) tablet 10 mg, 10 mg, Oral, TID, Carollee Herter, DO, 10 mg at 12/28/22 1432   insulin aspart (novoLOG) injection 0-5 Units, 0-5 Units, Subcutaneous, QHS, Imogene Burn, Shonda Mandarino, DO   insulin aspart (novoLOG) injection 0-9 Units, 0-9 Units, Subcutaneous, TID WC, Carollee Herter, DO, 1 Units at 12/28/22 1807   ipratropium-albuterol (DUONEB) 0.5-2.5 (3) MG/3ML nebulizer solution 3 mL, 3 mL, Nebulization, Q6H PRN, Carollee Herter, DO, 3 mL at 12/28/22 1819   melatonin tablet 5 mg, 5 mg, Oral, QHS PRN, Dow Adolph N, DO, 5 mg at 12/26/22 2214   metoprolol succinate (TOPROL-XL) 24 hr tablet 50 mg, 50 mg, Oral, Daily, Kc, Ramesh, MD, 50 mg at 12/28/22 1132   prochlorperazine (COMPAZINE) injection 5 mg, 5 mg, Intravenous, Q6H PRN, Hall, Carole N, DO   sodium bicarbonate tablet 1,300 mg, 1,300 mg, Oral, TID, Dahal, Binaya, MD, 1,300 mg at 12/28/22 1807  Vitals   Vitals:   12/28/22 0743 12/28/22 1630 12/28/22 1819 12/28/22 1934  BP:  (!) 155/43  (!) 157/49  Pulse: 70 72  72  Resp: 17 18    Temp:  98.4 F (36.9 C)    TempSrc:  Oral    SpO2: 94% 94% 95% 90%  Weight:      Height:        Body mass index is 26.26 kg/m.  Physical Exam   Constitutional: Appears well-developed and well-nourished.  Psych: Anxious affect Eyes: No scleral injection.  Head: Normocephalic.  Respiratory: Slight SOB when speaking long sentences Ext: No edema   Neurologic Examination  Mental Status: Alert and oriented. Anxious affect. Speech fluent without evidence of aphasia.  Able to follow all  commands without difficulty. Cranial Nerves: II: Temporal visual fields intact with no extinction to DSS. PERRL  III,IV, VI: No ptosis. EOMI.  V: Temp sensation equal bilaterally  VII: Smile symmetric on grimace and mouth puckering, then with what appeared to be an affected asymmetric smile on a second trial with her RN present (asymmetric contractions without clear weakness, right side of mouth showing teeth, left side of mouth closed). This resolved after a third assessment.  VIII: Hearing intact to voice IX,X: No hoarseness XI: Symmetric shoulder shrug XII: No lingual dysarthria Motor: BUE 5/5 proximally and distally BLE 5/5 proximally and distally  No pronator drift.  Sensory: Temp and light touch intact throughout, bilaterally. No extinction to DSS. No dysesthesias elicited with testing of LUE.  Deep Tendon Reflexes: 3+ right biceps and brachioradialis. 2+ left biceps and brachioradialis. 3+ bilateral patellars. a Cerebellar: Mild dysmetria with FNF bilaterally, in conjunction with action tremor bilaterally.  Gait: Deferred  Labs/Imaging/Neurodiagnostic studies   CBC:  Recent Labs  Lab 01/15/23 0606 12/23/22 0654 12/24/22 0615 12/26/22 0536 12/27/22 0725  WBC 7.2 7.1   < > 7.6 7.7  NEUTROABS 5.3 4.6  --   --   --   HGB 8.0* 8.3*   < > 7.5* 7.8*  HCT 24.0* 25.0*   < > 22.6* 24.1*  MCV 98.4 97.7   < > 97.4 98.4  PLT 226 146*   < > 208 242   < > = values in this interval  not displayed.   Basic Metabolic Panel:  Lab Results  Component Value Date   NA 140 12/28/2022   K 4.0 12/28/2022   CO2 25 12/28/2022   GLUCOSE 153 (H) 12/28/2022   BUN 29 (H) 12/28/2022   CREATININE 3.42 (H) 12/28/2022   CALCIUM 7.9 (L) 12/28/2022   GFRNONAA 13 (L) 12/28/2022   GFRAA 47 (L) 07/04/2019   Lipid Panel:  Lab Results  Component Value Date   LDLCALC 80 06/08/2022   HgbA1c:  Lab Results  Component Value Date   HGBA1C 8.2 (H) 11/30/2022   Urine Drug Screen:     Component Value  Date/Time   LABOPIA POSITIVE (A) 03/17/2021 2219   COCAINSCRNUR NONE DETECTED 03/17/2021 2219   LABBENZ NONE DETECTED 03/17/2021 2219   AMPHETMU NONE DETECTED 03/17/2021 2219   THCU NONE DETECTED 03/17/2021 2219   LABBARB NONE DETECTED 03/17/2021 2219    Alcohol Level     Component Value Date/Time   ETH <10 01/11/2022 2048   INR  Lab Results  Component Value Date   INR 1.0 01/11/2022   APTT  Lab Results  Component Value Date   APTT 23 (L) 01/11/2022   AED levels: No results found for: "PHENYTOIN", "ZONISAMIDE", "LAMOTRIGINE", "LEVETIRACETA"  CT Head without contrast(Personally reviewed): 1. No acute intracranial abnormality. 2. ASPECTS is 10. 3. Moderate chronic microvascular ischemic disease with chronic encephalomalacia at the right occipital lobe, stable.    ASSESSMENT  89 year old female with a prior history of stroke and multiple additional stroke risk factors, admitted for ARF on CKD, who experienced sudden onset of LUE paresthesia and sensation of left face drawing up on 2W this evening.  - Not a TNK candidate as exam reveals no objective findings militating in favor of an acute stroke. Some functionality noted (see exam documented above). NIHSS 2 for mild BUE dysmetria with tremor which are most likely chronic.  - CT head with no acute abnormality. An old right occipitoparietal stroke and chronic small vessel ischemic changes are appreciated.  - Will need MRI brain to fully rule out an acute stroke    RECOMMENDATIONS  1. HgbA1c, fasting lipid panel 2. MRI of the brain without contrast 3. PT consult, OT consult, Speech consult 4. Echocardiogram 5. Statin if no medical contraindication 6. Continue ASA and Plavix 7. Risk factor modification 8. Telemetry monitoring 9. Frequent neuro checks 10 NPO until passes stroke swallow screen 11. Unable to obtain CTA due to eGFR of 13 12. MRA of head and neck without contrast    ______________________________________________________________________    Dessa Phi, Marzella Miracle, MD [facility]

## 2022-12-28 NOTE — Progress Notes (Signed)
PROGRESS NOTE    Sandra [name]  MWU:[id number] DOB: 07/29/1933 DOA: 12/19/2022 PCP: [name], NP  Subjective: Pt seen and examined. Met with pt's son Onalee Hua at bedside. Reviewed that pt had CKD stage 5. She is not a HD candidate. Discussed that pt has a delicate balance between too much fluid and too little fluid. She can only be on prn lasix due to severe CKD. Pt will need to weigh herself every day. Only takes lasix if her weight is +1 lbs above her dry weight. She will need close f/u with PCP and nephrology.   Hospital Course: HPI: 56 yof w/ DM2, HTN, HLD, CKD 4 b/l ~ 2.3, TIA, anxiety/depression, GERD who was seen in the ED for abdominal pain, diarrhea.CT abdomen showed acute uncomplicated colitis involving the descending colon and proximal sigmoid colon. She did not meet criteria for admission and was discharged on a course of ciprofloxacin and Flagyl. Despite continuation of antibiotics,patient continued with nausea, vomiting, poor oral intake and generalized weakness and hence returned to ED on 11/30 again. In the ED,patient was afebrile,HR 60s,BP 130s, RA. Labs with WBC- 8,HB 8.8, creatinine significant elevated to 58/5 compared to a baseline of 32/2.74 last week. Started on IV fluid and admitted for further management. Resuming amaryl at 1 mg dose to make sure she can tolerate  before discharge   Significant Events: Admitted 12/19/2022 acute on chronic kidney disease stage 4   Significant Labs: Admission WBC 8, Hgb 8.8, Na 141, K 4.3, bicarb 16, BUN 53, Scr 4.21 B12 238(nl) Folate 7.9(nl) Iron 111(nl), TIBC 196(low), %sat 57 (high), ferritin 192(nl)  Significant Imaging Studies: Admission Renal U/S shows Normal ultrasound appearance of the kidneys. No mass or hydronephrosis identified.  Antibiotic Therapy: Anti-infectives (From admission, onward)    Start     Dose/Rate Route Frequency Ordered Stop   12/20/22 0030  cefTRIAXone (ROCEPHIN) 2 g in sodium chloride  0.9 % 100 mL IVPB        2 g 200 mL/hr over 30 Minutes Intravenous Daily at bedtime 12/20/22 0021 12/20/22 2243   12/20/22 0030  metroNIDAZOLE (FLAGYL) IVPB 500 mg        500 mg 100 mL/hr over 60 Minutes Intravenous 2 times daily 12/20/22 0021 12/21/22 1028       Procedures:   Consultants:     Assessment and Plan: * Acute renal failure superimposed on stage 5 chronic kidney disease, not on chronic dialysis (HCC) 12-19-2022 through 12-26-2022 AKI prerenal in the setting of dehydration poor oral intake b/l creat 2.1, In Nov 30, 2022 at [facility] kidney office. PTA on prn lasix lisinopril and on hold.Bladder scan and renal ultrasound ruled out obstructive uropathy.  Received IV fluids initially with improvement from 5-now holding in 3 range. She needed lasix for shortness of breath 20mg  x 2 with Bnp in 500s on 12/4. Patient creatinine has significantly improved from admission value now plateaued at 3.1 -discussed w/ [name] from nephrology-reviewed the lab work, he will arrange outpatient follow-up next week with [name] and okay for discharge Continue oral bicarb encourage oral hydration  12-27-2022 pt with clear respiratory distress. BNP elevated to 611 and CXR shows diffuse interstitial lung disease. However, bedside thoracic U/S with multiple B-lines consistent with pulmonary edema. Pt did urinate about 250 ml after IV lasix. Discussed with dtr [name] that aggressive diuresis cannot be used in patient due to CKD stage 5. Pt is not HD candidate. Dtr does not seem to understand that pt has CKD  stage 5 and there is not much that can be done about pt's fluid retention. Asked pt not to drink too much water.  12-28-2022 pt improved with 80 mg IV lasix x 1 yesterday. Only recorded 525 ml urine yesterday. But now on RA. Scr has increased to 3.42. BUN 29.  Pt's son seems to understand that pt's CKD is severe. That she is not a HD candidate and that if she gets any worse, family will need to consider  palliative care. Will monitor pt today. Repeat BMP in AM. Consider DC in AM. Told son that family needs to go out today and purchase a digital scale for pt's use at home. Pt states she already has home health RN at home.  Malnutrition of moderate degree 12-27-2022. See RD noted from 12-22-2022. Moderate Malnutrition related to acute illness as evidenced by estimated needs, moderate fat depletion, moderate muscle depletion.   DNR (do not resuscitate) 12-27-2022 pt made DNR on admission.   Uncontrolled type 2 diabetes mellitus with hyperglycemia (HCC) 12-27-2022 add SSI. Stop glipizide due to CKD stage 5  ILD (interstitial lung disease) (HCC) 12-27-2022 chronic. Mild pulmonary edema. Diuresis today with 1 dose of IV lasix. Continue with duonebs qid.  Type 2 diabetes mellitus with hyperglycemia (HCC) 12-27-2022 given her CKD stage 5, will stop glipizide. Place on SSI.  Essential hypertension 12-27-2022 will stop norvasc as it can increase her peripheral edema. Continue with lopressor.  12-28-2022 add hydralazine 10 mg tid for additional BP control.       DVT prophylaxis: heparin injection 5,000 Units Start: 12/19/22 2200     Code Status: Limited: Do not attempt resuscitation (DNR) -DNR-LIMITED -Do Not Intubate/DNI  Family Communication: discussed with pt and son Onalee Hua at bedside Disposition Plan: return home Reason for continuing need for hospitalization: monitor pt's Scr.  Objective: Vitals:   12/27/22 2025 12/28/22 0455 12/28/22 0731 12/28/22 0743  BP: (!) 157/49 (!) 154/60 (!) 172/65   Pulse: 69 70 68 70  Resp:   18 17  Temp: 98 F (36.7 C) 98.2 F (36.8 C) 98.5 F (36.9 C)   TempSrc: Oral Oral Oral   SpO2: 92% 100% 93% 94%  Weight:  61 kg    Height:        Intake/Output Summary (Last 24 hours) at 12/28/2022 1209 Last data filed at 12/28/2022 1142 Gross per 24 hour  Intake 360 ml  Output 525 ml  Net -165 ml   Filed Weights   12/26/22 0500 12/27/22 0500 12/28/22 0455   Weight: 62.2 kg 63 kg 61 kg    Examination:  Physical Exam Vitals reviewed.  HENT:     Head: Normocephalic and atraumatic.     Nose: Nose normal.  Cardiovascular:     Rate and Rhythm: Normal rate and regular rhythm.  Pulmonary:     Effort: Pulmonary effort is normal.     Comments: Dry rales Abdominal:     General: Abdomen is flat. Bowel sounds are normal.     Palpations: Abdomen is soft.  Musculoskeletal:     Right lower leg: No edema.     Left lower leg: No edema.  Skin:    General: Skin is warm and dry.     Capillary Refill: Capillary refill takes less than 2 seconds.  Neurological:     General: No focal deficit present.     Mental Status: She is alert and oriented to person, place, and time.     Data Reviewed: I have personally reviewed  following labs and imaging studies  CBC: Recent Labs  Lab 12/22/22 0606 12/23/22 0654 12/24/22 0615 12/25/22 0535 12/25/22 0901 12/26/22 0536 12/27/22 0725  WBC 7.2 7.1 8.0 7.3  --  7.6 7.7  NEUTROABS 5.3 4.6  --   --   --   --   --   HGB 8.0* 8.3* 7.7* 6.9* 7.9* 7.5* 7.8*  HCT 24.0* 25.0* 23.9* 20.8* 24.6* 22.6* 24.1*  MCV 98.4 97.7 96.4 96.7  --  97.4 98.4  PLT 226 146* 224 205  --  208 242   Basic Metabolic Panel: Recent Labs  Lab 12/24/22 0615 12/25/22 0535 12/26/22 0536 12/27/22 0725 12/28/22 0704  NA 140 136 141 141 140  K 3.3* 3.4* 4.6 3.9 4.0  CL 107 107 109 107 106  CO2 23 22 22 26 25   GLUCOSE 110* 110* 129* 159* 153*  BUN 26* 25* 29* 29* 29*  CREATININE 3.12* 3.13* 3.18* 3.40* 3.42*  CALCIUM 7.9* 7.3* 7.7* 8.2* 7.9*  MG  --   --   --   --  1.3*   GFR: Estimated Creatinine Clearance: 9.6 mL/min (A) (by C-G formula based on SCr of 3.42 mg/dL (H)). Liver Function Tests: Recent Labs  Lab 12/28/22 0704  AST 18  ALT 14  ALKPHOS 52  BILITOT 0.5  PROT 5.5*  ALBUMIN 2.7*   BNP (last 3 results) Recent Labs    12/23/22 0654 12/27/22 0725 12/28/22 0704  BNP 549.7* 611.4* 509.4*   CBG: Recent  Labs  Lab 12/26/22 1954 12/27/22 0739 12/27/22 1614 12/27/22 2145 12/28/22 0729  GLUCAP 254* 144* 264* 196* 146*   Radiology Studies: DG CHEST PORT 1 VIEW  Result Date: 12/27/2022 CLINICAL DATA:  Respiratory distress. EXAM: PORTABLE CHEST 1 VIEW COMPARISON:  12/23/2022 FINDINGS: Low volume film. Cardiopericardial silhouette is at upper limits of normal for size. Diffuse interstitial lung disease again noted with bibasilar atelectasis or infiltrate. No substantial pleural effusion. No acute bony abnormality. IMPRESSION: Low volume film with diffuse interstitial lung disease and bibasilar atelectasis or infiltrate. Electronically Signed   By: Kennith Center M.D.   On: 12/27/2022 08:53    Scheduled Meds:  aspirin EC  81 mg Oral Daily   atorvastatin  80 mg Oral QPM   citalopram  10 mg Oral Daily   clopidogrel  75 mg Oral Daily   vitamin B-12  1,000 mcg Oral Daily   famotidine  20 mg Oral QHS   feeding supplement  1 Container Oral TID BM   feeding supplement  237 mL Oral BID BM   heparin  5,000 Units Subcutaneous Q8H   hydrALAZINE  10 mg Oral TID   insulin aspart  0-5 Units Subcutaneous QHS   insulin aspart  0-9 Units Subcutaneous TID WC   metoprolol succinate  50 mg Oral Daily   sodium bicarbonate  1,300 mg Oral TID   Continuous Infusions:   LOS: 9 days   Time spent: 40 minutes  Carollee Herter, DO  [facility]  12/28/2022, 12:09 PM

## 2022-12-28 NOTE — Progress Notes (Signed)
Daughter of pt called and notified of transfer to 3west room 32 and details of the evening that lead to it.  Daughter stated she will be here tomorrow morning.

## 2022-12-28 NOTE — Progress Notes (Signed)
Code stroke called after pt called out and stated, " I think I am having a stroke."  Pt c/o left arm numbness and a feeling that her mouth is drawing up.  PEARL, upper and lower extremeties equal in strength.  Neuro MD notified.  NIH score being done.  Pt going to CT at this time with code stroke team.

## 2022-12-28 NOTE — Plan of Care (Signed)

## 2022-12-28 NOTE — Progress Notes (Signed)
1800: RN went to check on pt, pt sitting in bed and pt daughter at bedside. Pt daughter upset and stating she is concerned about pt's breathing and worried about pt discharging tomorrow and coming right back to hospital. Pt stating she is also concerned and feels she has been coughing more and doesn't breathe like she does at home, pt concerned about getting pneumonia. RN explained to pt daughter that pt feeling fine most of the day and had been denying any issues but has been coughing some, RN explained that pt has been sating 92% and above on room ai and MD has ordered a BNP lab check for the AM. Pt daughter stated that at home her mother's "saturations are at 100%." RN reached out to MD per pt and family request to ask for a chest xray to be ordered in AM.  MD notified RN that pt has hx of interstitial lung disease and CKD stage 5, MD made aware the chest xray not needed, no new orders at this time, MD stated he spoke with pt son about all updates earlier during shift. Pt also requested her PRN anxiety medication. RN gave anxiety medication and gave pt breathing treatment for wheezing. Pt saturations were 95% on room air. RN brought pt an IS and instructed pt on how to use. Pt resting in bed when RN left room.  1930: During report RN found nightshift nurse and charge RN and rapid response nurse in pt room, pt had complained of stroke-like symptoms. Pt being assessed, and blood sugar taken and WNL, nightshift RN notified MD. Pt taken to CT by rapid response and nightshift nurse

## 2022-12-28 NOTE — Progress Notes (Signed)
Mobility Specialist Progress Note:    12/28/22 1027  Mobility  Activity Transferred from chair to bed  Level of Assistance Independent  Assistive Device None  Distance Ambulated (ft) 3 ft  Activity Response Tolerated well  Mobility Referral Yes  Mobility visit 1 Mobility  Mobility Specialist Start Time (ACUTE ONLY) 1025  Mobility Specialist Stop Time (ACUTE ONLY) 1028  Mobility Specialist Time Calculation (min) (ACUTE ONLY) 3 min   Pt requesting to transfer back to bed from chair. Tolerated well, asx throughout. Left with all needs met and call bell in reach.   Feliciana Rossetti Mobility Specialist Please contact via Special educational needs teacher or  Rehab office at [phone]

## 2022-12-28 NOTE — Progress Notes (Signed)
Occupational Therapy Treatment Patient Details Name: Sandra [name] MRN: [id number] DOB: 1933-05-12 Today's Date: 12/28/2022   History of present illness Pt is an 89 y/o F presenting to ED on 11/30 with weakness, diarrhea, and emesis. Labs concerning for worsening renal insufficiency for creatinine >5. Admitted for further mgmt of AKI. PMH includes diabetes.   OT comments  Pt progressing towards goals this session, needing supervision for bed mobility, CGA for transfers without AD, and set up - CGA for ADLs. Pt down to 88% on RA after ambulation, incr to 93% after ~2 mins sitting in chair. Pt presenting with impairments listed below, will follow acutely. Continue to recommend HHOT at d/c.        If plan is discharge home, recommend the following:  A little help with walking and/or transfers;A little help with bathing/dressing/bathroom;Assistance with cooking/housework;Direct supervision/assist for financial management;Direct supervision/assist for medications management;Supervision due to cognitive status   Equipment Recommendations  None recommended by OT    Recommendations for Other Services PT consult    Precautions / Restrictions Precautions Precautions: Fall Restrictions Weight Bearing Restrictions: No       Mobility Bed Mobility Overal bed mobility: Needs Assistance Bed Mobility: Supine to Sit     Supine to sit: Supervision          Transfers Overall transfer level: Needs assistance Equipment used: None Transfers: Sit to/from Stand Sit to Stand: Contact guard assist           General transfer comment: mild sway with ambulation but able to self correct     Balance Overall balance assessment: Needs assistance Sitting-balance support: Feet supported Sitting balance-Leahy Scale: Normal     Standing balance support: During functional activity, Single extremity supported, No upper extremity supported Standing balance-Leahy Scale: Fair Standing balance  comment: no UE support                           ADL either performed or assessed with clinical judgement   ADL Overall ADL's : Needs assistance/impaired Eating/Feeding: Set up   Grooming: Wash/dry hands;Brushing hair;Standing           Upper Body Dressing : Contact guard assist;Standing Upper Body Dressing Details (indicate cue type and reason): donning clean gown     Toilet Transfer: Contact guard assist   Toileting- Clothing Manipulation and Hygiene: Supervision/safety       Functional mobility during ADLs: Contact guard assist      Extremity/Trunk Assessment Upper Extremity Assessment Upper Extremity Assessment: Generalized weakness   Lower Extremity Assessment Lower Extremity Assessment: Defer to PT evaluation        Vision   Vision Assessment?: No apparent visual deficits   Perception Perception Perception: Not tested   Praxis Praxis Praxis: Not tested    Cognition Arousal: Alert Behavior During Therapy: WFL for tasks assessed/performed Overall Cognitive Status: Within Functional Limits for tasks assessed                                          Exercises      Shoulder Instructions       General Comments SpO2 down to 88% on RA, incr to 93% on RA after seated rest break    Pertinent Vitals/ Pain       Pain Assessment Pain Assessment: No/denies pain  Home Living  Prior Functioning/Environment              Frequency  Min 1X/week        Progress Toward Goals  OT Goals(current goals can now be found in the care plan section)  Progress towards OT goals: Progressing toward goals  Acute Rehab OT Goals Patient Stated Goal: none stated OT Goal Formulation: With patient Time For Goal Achievement: 01/03/23 Potential to Achieve Goals: Good ADL Goals Pt Will Perform Upper Body Dressing: with modified independence;sitting Pt Will Perform Lower Body  Dressing: with modified independence;sitting/lateral leans;sit to/from stand Pt Will Transfer to Toilet: with modified independence;ambulating;regular height toilet Pt Will Perform Tub/Shower Transfer: Tub transfer;Shower transfer;with modified independence;shower seat;ambulating Additional ADL Goal #1: pt will be able to tolerate OOB activity x10 min in order to improve ind with ADLs  Plan      Co-evaluation                 AM-PAC OT "6 Clicks" Daily Activity     Outcome Measure   Help from another person eating meals?: None Help from another person taking care of personal grooming?: A Little Help from another person toileting, which includes using toliet, bedpan, or urinal?: A Little Help from another person bathing (including washing, rinsing, drying)?: A Little Help from another person to put on and taking off regular upper body clothing?: A Little Help from another person to put on and taking off regular lower body clothing?: A Little 6 Click Score: 19    End of Session Equipment Utilized During Treatment: Gait belt  OT Visit Diagnosis: Unsteadiness on feet (R26.81);Other abnormalities of gait and mobility (R26.89);Muscle weakness (generalized) (M62.81)   Activity Tolerance Patient tolerated treatment well   Patient Left in chair;with call bell/phone within reach;with chair alarm set   Nurse Communication Mobility status        Time: 1610-9604 OT Time Calculation (min): 22 min  Charges: OT General Charges $OT Visit: 1 Visit OT Treatments $Self Care/Home Management : 8-22 mins  Carver Fila, OTD, OTR/L SecureChat Preferred Acute Rehab [phone]   [name] 12/28/2022, 9:39 AM

## 2022-12-28 NOTE — Progress Notes (Addendum)
89 year old female history of CVA in 2023, CKD stage V, malnutrition, uncontrolled DM type II, interstitial lung disease and essential hypertension who has been admitted initially for management of AKI on superimposed CKD stage V and plan was to discharge her to the home today however in the evening patient developed new onset of left-sided upper extremity paresthesia.  Patient has been evaluated by neurology Dr. Otelia Limes who recommended stroke workup.  Patient's nurse reported that patient needs to transfer to higher level of care to a neuro progressive unit as nih stroke has been ordered to check every 2 hours.  I have requested transfer her to a progressive unit.  Patient also passed bedside swallow screen so I have resumed patient's diet.  Tereasa Coop, MD [facility] 12/28/2022, 10:04 PM

## 2022-12-28 NOTE — Code Documentation (Signed)
Responded to code stroke called at [facility] for left facial droop and left arm numbness, LSN- 19:30. CBG 112, NIH 0 on RRT assessment. CT head negative for acute changes. TNK not given- NIH back to 0.   Plan: Patient on TIA alert. Please complete vital signs/ neuro checks Q2 x 12hours then Q4 until discontinued.

## 2022-12-28 NOTE — Progress Notes (Signed)
Physical Therapy Treatment Patient Details Name: Sandra [name] MRN: [id number] DOB: 12/07/1933 Today's Date: 12/28/2022   History of Present Illness Pt is an 89 y/o F presenting to ED on 11/30 with weakness, diarrhea, and emesis. Labs concerning for worsening renal insufficiency for creatinine >5. Admitted for further mgmt of AKI. PMH includes diabetes.    PT Comments  Pt received in supine and agreeable to session. Pt able to tolerate gait trial in the hallway, however distance is limited this session by DOE and fatigue. Pt demonstrates mild instability and reports that balance is not back to baseline. Pt requires seated rest break and then is able to transfer to the bed and perform x5 serial STS without UE support. Pt continues to benefit from PT services to progress toward functional mobility goals.     If plan is discharge home, recommend the following: Assistance with cooking/housework;Help with stairs or ramp for entrance   Can travel by private vehicle        Equipment Recommendations  None recommended by PT    Recommendations for Other Services       Precautions / Restrictions Precautions Precautions: Fall Restrictions Weight Bearing Restrictions: No     Mobility  Bed Mobility Overal bed mobility: Needs Assistance Bed Mobility: Supine to Sit     Supine to sit: Supervision          Transfers Overall transfer level: Needs assistance Equipment used: None Transfers: Sit to/from Stand Sit to Stand: Supervision           General transfer comment: From EOB with and without UE support with no LOB    Ambulation/Gait Ambulation/Gait assistance: Supervision Gait Distance (Feet): 150 Feet Assistive device: None Gait Pattern/deviations: Step-through pattern, Drifts right/left       General Gait Details: mild instability, but no LOB. Pt reports DOE at end of trial requiring seated rest break     Balance Overall balance assessment: Needs  assistance Sitting-balance support: Feet supported Sitting balance-Leahy Scale: Normal     Standing balance support: During functional activity, No upper extremity supported Standing balance-Leahy Scale: Fair Standing balance comment: without AD                            Cognition Arousal: Alert Behavior During Therapy: WFL for tasks assessed/performed Overall Cognitive Status: Within Functional Limits for tasks assessed                                          Exercises Other Exercises Other Exercises: x5 serial STS without UE support    General Comments General comments (skin integrity, edema, etc.): DOE during gait trial      Pertinent Vitals/Pain Pain Assessment Pain Assessment: No/denies pain     PT Goals (current goals can now be found in the care plan section) Acute Rehab PT Goals Patient Stated Goal: back to home without using RW PT Goal Formulation: With patient Time For Goal Achievement: 01/03/23 Progress towards PT goals: Progressing toward goals    Frequency    Min 1X/week       AM-PAC PT "6 Clicks" Mobility   Outcome Measure  Help needed turning from your back to your side while in a flat bed without using bedrails?: None Help needed moving from lying on your back to sitting on the side of a flat bed without using  bedrails?: A Little Help needed moving to and from a bed to a chair (including a wheelchair)?: A Little Help needed standing up from a chair using your arms (e.g., wheelchair or bedside chair)?: A Little Help needed to walk in hospital room?: A Little Help needed climbing 3-5 steps with a railing? : A Little 6 Click Score: 19    End of Session   Activity Tolerance: Patient tolerated treatment well;Patient limited by fatigue Patient left: in bed;with call bell/phone within reach;with bed alarm set Nurse Communication: Mobility status PT Visit Diagnosis: Unsteadiness on feet (R26.81)     Time:  1254-1310 PT Time Calculation (min) (ACUTE ONLY): 16 min  Charges:    $Gait Training: 8-22 mins PT General Charges $$ ACUTE PT VISIT: 1 Visit                     Sandra [name], PTA Acute Rehabilitation Services Secure Chat Preferred  [phone]    Sandra [name] 12/28/2022, 1:17 PM

## 2022-12-29 ENCOUNTER — Inpatient Hospital Stay (HOSPITAL_COMMUNITY): Payer: Medicare HMO

## 2022-12-29 ENCOUNTER — Other Ambulatory Visit (HOSPITAL_COMMUNITY): Payer: Self-pay

## 2022-12-29 DIAGNOSIS — Z66 Do not resuscitate: Secondary | ICD-10-CM | POA: Diagnosis not present

## 2022-12-29 DIAGNOSIS — N179 Acute kidney failure, unspecified: Secondary | ICD-10-CM | POA: Diagnosis not present

## 2022-12-29 DIAGNOSIS — I10 Essential (primary) hypertension: Secondary | ICD-10-CM

## 2022-12-29 DIAGNOSIS — F419 Anxiety disorder, unspecified: Secondary | ICD-10-CM

## 2022-12-29 DIAGNOSIS — J849 Interstitial pulmonary disease, unspecified: Secondary | ICD-10-CM | POA: Diagnosis not present

## 2022-12-29 DIAGNOSIS — R4182 Altered mental status, unspecified: Secondary | ICD-10-CM | POA: Insufficient documentation

## 2022-12-29 LAB — TYPE AND SCREEN
ABO/RH(D): O NEG
Antibody Screen: NEGATIVE
Unit division: 0
Unit division: 0

## 2022-12-29 LAB — BPAM RBC
Blood Product Expiration Date: 202412132359
Blood Product Expiration Date: 202412172359
Unit Type and Rh: 9500
Unit Type and Rh: 9500

## 2022-12-29 LAB — GLUCOSE, CAPILLARY
Glucose-Capillary: 135 mg/dL — ABNORMAL HIGH (ref 70–99)
Glucose-Capillary: 316 mg/dL — ABNORMAL HIGH (ref 70–99)

## 2022-12-29 LAB — COMPREHENSIVE METABOLIC PANEL
ALT: 13 U/L (ref 0–44)
AST: 18 U/L (ref 15–41)
Albumin: 2.6 g/dL — ABNORMAL LOW (ref 3.5–5.0)
Alkaline Phosphatase: 51 U/L (ref 38–126)
Anion gap: 10 (ref 5–15)
BUN: 28 mg/dL — ABNORMAL HIGH (ref 8–23)
CO2: 27 mmol/L (ref 22–32)
Calcium: 7.9 mg/dL — ABNORMAL LOW (ref 8.9–10.3)
Chloride: 104 mmol/L (ref 98–111)
Creatinine, Ser: 3.14 mg/dL — ABNORMAL HIGH (ref 0.44–1.00)
GFR, Estimated: 14 mL/min — ABNORMAL LOW (ref >60)
Glucose, Bld: 132 mg/dL — ABNORMAL HIGH (ref 70–99)
Potassium: 3.8 mmol/L (ref 3.5–5.1)
Sodium: 141 mmol/L (ref 135–145)
Total Bilirubin: 0.5 mg/dL (ref <1.2)
Total Protein: 5.5 g/dL — ABNORMAL LOW (ref 6.5–8.1)

## 2022-12-29 LAB — LIPID PANEL
Cholesterol: 95 mg/dL (ref 0–200)
HDL: 35 mg/dL — ABNORMAL LOW (ref >40)
LDL Cholesterol: 37 mg/dL (ref 0–99)
Total CHOL/HDL Ratio: 2.7 {ratio}
Triglycerides: 116 mg/dL (ref <150)
VLDL: 23 mg/dL (ref 0–40)

## 2022-12-29 LAB — BRAIN NATRIURETIC PEPTIDE: B Natriuretic Peptide: 469.9 pg/mL — ABNORMAL HIGH (ref 0.0–100.0)

## 2022-12-29 LAB — ECHOCARDIOGRAM COMPLETE
Area-P 1/2: 5.84 cm2
Height: 60 in
S' Lateral: 2.4 cm
Weight: 2151.69 [oz_av]

## 2022-12-29 MED ORDER — HYDRALAZINE HCL 25 MG PO TABS
25.0000 mg | ORAL_TABLET | Freq: Three times a day (TID) | ORAL | 0 refills | Status: AC
  Filled 2022-12-29: qty 270, 90d supply, fill #0

## 2022-12-29 MED ORDER — CYANOCOBALAMIN 1000 MCG PO TABS
1000.0000 ug | ORAL_TABLET | Freq: Every day | ORAL | 0 refills | Status: DC
  Filled 2022-12-29: qty 90, 90d supply, fill #0

## 2022-12-29 MED ORDER — HYDRALAZINE HCL 25 MG PO TABS
25.0000 mg | ORAL_TABLET | Freq: Three times a day (TID) | ORAL | Status: DC
  Administered 2022-12-29: 25 mg via ORAL
  Filled 2022-12-29: qty 1

## 2022-12-29 MED ORDER — SODIUM BICARBONATE 650 MG PO TABS
1300.0000 mg | ORAL_TABLET | Freq: Two times a day (BID) | ORAL | 0 refills | Status: DC
  Filled 2022-12-29: qty 360, 90d supply, fill #0

## 2022-12-29 MED ORDER — FUROSEMIDE 20 MG PO TABS
20.0000 mg | ORAL_TABLET | Freq: Every day | ORAL | 0 refills | Status: DC | PRN
  Filled 2022-12-29: qty 30, 30d supply, fill #0

## 2022-12-29 MED ORDER — FAMOTIDINE 20 MG PO TABS
40.0000 mg | ORAL_TABLET | Freq: Every day | ORAL | 0 refills | Status: DC
  Filled 2022-12-29: qty 180, 90d supply, fill #0

## 2022-12-29 MED ORDER — IPRATROPIUM-ALBUTEROL 0.5-2.5 (3) MG/3ML IN SOLN
3.0000 mL | Freq: Four times a day (QID) | RESPIRATORY_TRACT | 0 refills | Status: DC | PRN
  Filled 2022-12-29: qty 360, 30d supply, fill #0

## 2022-12-29 NOTE — Progress Notes (Signed)
   Discussed with stroke neurologist. Pt ok to discharge. No new CVA. Echo negative for thrombus. Negative for intra-cardiac shunt. LVEF 60%  Carollee Herter, DO [facility]

## 2022-12-29 NOTE — TOC Transition Note (Addendum)
Transition of Care [facility]) - CM/SW Discharge Note   Patient Details  Name: Sandra [name] MRN: [id number] Date of Birth: 1933/05/03  [facility] CM/SW Contact:  Kermit Balo, RN Phone Number: 12/29/2022, 3:00 PM   Clinical Narrative:     Pt is discharging home with home health through [facility]. Information on the AVS. Frances Furbish will contact you for the first home visit. Pt has needed DME at home. Pt has transportation home.  SDOH Interventions Today    Flowsheet Row Most Recent Value  SDOH Interventions   Food Insecurity Interventions Inpatient TOC      Pt denied food insecurities at home.   Final next level of care: Home w Home Health Services Barriers to Discharge: No Barriers Identified   Patient Goals and CMS Choice      Discharge Placement                         Discharge Plan and Services Additional resources added to the After Visit Summary for                            HH Arranged: RN, PT [facility] Agency: [facility] Date [facility] Agency Contacted: 12/29/22   Representative spoke with at [facility] Agency: Kandee Keen  Social Determinants of Health (SDOH) Interventions SDOH Screenings   Food Insecurity: Food Insecurity Present (12/19/2022)  Housing: Patient Declined (12/19/2022)  Transportation Needs: No Transportation Needs (12/19/2022)  Utilities: Not At Risk (12/19/2022)  Depression (PHQ2-9): Low Risk  (09/01/2022)  Tobacco Use: Medium Risk (12/19/2022)     Readmission Risk Interventions    12/21/2022   12:26 PM  Readmission Risk Prevention Plan  Transportation Screening Complete  PCP or Specialist Appt within 3-5 Days Complete  HRI or Home Care Consult Complete  Social Work Consult for Recovery Care Planning/Counseling Complete  Palliative Care Screening Not Applicable  Medication Review [employer]) Complete

## 2022-12-29 NOTE — Progress Notes (Signed)
PROGRESS NOTE    Sandra [name]  NWG:[id number] DOB: July 06, 1933 DOA: 12/19/2022 PCP: [name], NP  Subjective: Pt seen and examined. Events of last night noted. Code Stroke called.  MRI/MRA brain negative for acute CVA. 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  Scr down to 3.14, BUN 28, BNP 469.  Awaiting echo that was ordered by neurology for Code stroke workup.   Hospital Course: HPI: 54 yof w/ DM2, HTN, HLD, CKD 4 b/l ~ 2.3, TIA, anxiety/depression, GERD who was seen in the ED for abdominal pain, diarrhea.CT abdomen showed acute uncomplicated colitis involving the descending colon and proximal sigmoid colon. She did not meet criteria for admission and was discharged on a course of ciprofloxacin and Flagyl. Despite continuation of antibiotics,patient continued with nausea, vomiting, poor oral intake and generalized weakness and hence returned to ED on 11/30 again. In the ED,patient was afebrile,HR 60s,BP 130s, RA. Labs with WBC- 8,HB 8.8, creatinine significant elevated to 58/5 compared to a baseline of 32/2.74 last week. Started on IV fluid and admitted for further management. Resuming amaryl at 1 mg dose to make sure she can tolerate  before discharge   Significant Events: Admitted 12/19/2022 acute on chronic kidney disease stage 4   Significant Labs: Admission WBC 8, Hgb 8.8, Na 141, K 4.3, bicarb 16, BUN 53, Scr 4.21 B12 238(nl) Folate 7.9(nl) Iron 111(nl), TIBC 196(low), %sat 57 (high), ferritin 192(nl)  Significant Imaging Studies: Admission Renal U/S shows Normal ultrasound appearance of the kidneys. No mass or hydronephrosis identified.  Antibiotic Therapy: Anti-infectives (From admission, onward)    Start     Dose/Rate Route Frequency Ordered Stop   12/20/22 0030  cefTRIAXone (ROCEPHIN) 2 g in sodium chloride 0.9 % 100 mL IVPB        2 g 200 mL/hr over 30 Minutes Intravenous Daily at bedtime 12/20/22 0021 12/20/22 2243   12/20/22 0030  metroNIDAZOLE (FLAGYL)  IVPB 500 mg        500 mg 100 mL/hr over 60 Minutes Intravenous 2 times daily 12/20/22 0021 12/21/22 1028       Procedures:   Consultants:     Assessment and Plan: * Acute renal failure superimposed on stage 5 chronic kidney disease, not on chronic dialysis (HCC) 12-19-2022 through 12-26-2022 AKI prerenal in the setting of dehydration poor oral intake b/l creat 2.1, In Nov 30, 2022 at [facility] kidney office. PTA on prn lasix lisinopril and on hold.Bladder scan and renal ultrasound ruled out obstructive uropathy.  Received IV fluids initially with improvement from 5-now holding in 3 range. She needed lasix for shortness of breath 20mg  x 2 with Bnp in 500s on 12/4. Patient creatinine has significantly improved from admission value now plateaued at 3.1 -discussed w/ [name] from nephrology-reviewed the lab work, he will arrange outpatient follow-up next week with [name] and okay for discharge Continue oral bicarb encourage oral hydration  12-27-2022 pt with clear respiratory distress. BNP elevated to 611 and CXR shows diffuse interstitial lung disease. However, bedside thoracic U/S with multiple B-lines consistent with pulmonary edema. Pt did urinate about 250 ml after IV lasix. Discussed with dtr [name] that aggressive diuresis cannot be used in patient due to CKD stage 5. Pt is not HD candidate. Dtr does not seem to understand that pt has CKD stage 5 and there is not much that can be done about pt's fluid retention. Asked pt not to drink too much water.  12-28-2022 pt improved with 80 mg IV lasix x 1  yesterday. Only recorded 525 ml urine yesterday. But now on RA. Scr has increased to 3.42. BUN 29.  Pt's son seems to understand that pt's CKD is severe. That she is not a HD candidate and that if she gets any worse, family will need to consider palliative care. Will monitor pt today. Repeat BMP in AM. Consider DC in AM. Told son that family needs to go out today and purchase a digital scale for  pt's use at home. Pt states she already has home health RN at home.  12-29-2022 Scr has improved to 3.14, BUN 28. BNP 469. This is likely her baseline BNP. Weight yesterday 134.48 lbs.  ILD (interstitial lung disease) (HCC) 12-27-2022 chronic. Mild pulmonary edema. Diuresis today with 1 dose of IV lasix. Continue with duonebs qid.  12-29-2022 reviewed her chart. Pt previously seen by pulmonology in 2021. " Patient has underlying interstitial lung disease.  Felt to have progressive fibrosis with increased symptom burden.  She was started on Ofev November 2020.  Patient says she has been unable to tolerate this.  She has been taking but it has caused significant diarrhea.  Last visit Ofev dose was decreased to 100 mg twice daily.  Patient says she did not pick this up and does not plan on taking this again.  She has had very poor quality of life and does not wish to restart this.  Previous visit patient was recommended to also consider CellCept along with Bactrim.  We discussed this and other treatment options.  Patient says she does not like the side effects and currently prefers quality of life versus suffering from medication side effects".  Appears that pt had been lost to followup since 2021. Pt never went back to see pulmonology in regards to her ILD. It appears that her ILD has progressed since she voluntarily decided to stop treatment 3 years ago. Appears she is paying the price now for that decision. Unfortunately, not much can be done about it now. Will try and get her home O2. She would benefit from outpatient palliative care consult as I predict she is only going to get sicker and sicker in the future.  Malnutrition of moderate degree 12-27-2022. See RD noted from 12-22-2022. Moderate Malnutrition related to acute illness as evidenced by estimated needs, moderate fat depletion, moderate muscle depletion.   DNR (do not resuscitate) 12-27-2022 pt made DNR on admission.   Uncontrolled type 2  diabetes mellitus with hyperglycemia (HCC) 12-27-2022 add SSI. Stop glipizide due to CKD stage 5  Type 2 diabetes mellitus with hyperglycemia (HCC) 12-27-2022 given her CKD stage 5, will stop glipizide. Place on SSI.  Essential hypertension 12-27-2022 will stop norvasc as it can increase her peripheral edema. Continue with lopressor.  12-28-2022 add hydralazine 10 mg tid for additional BP control.  12-29-2022 continue with toprol-xl 50 mg daily, hydralazine 25 mg tid.   DVT prophylaxis: heparin injection 5,000 Units Start: 12/19/22 2200    Code Status: Limited: Do not attempt resuscitation (DNR) -DNR-LIMITED -Do Not Intubate/DNI  Family Communication: no family at bedside Disposition Plan: return home Reason for continuing need for hospitalization: awaiting echo today. May be able to DC back home.  Objective: Vitals:   12/29/22 0427 12/29/22 0636 12/29/22 0750 12/29/22 1127  BP: (!) 143/54 (!) 158/52 (!) 158/61 (!) 153/55  Pulse: 61 68 64 65  Resp: 19  19 17   Temp: 97.8 F (36.6 C) 98 F (36.7 C) 98.5 F (36.9 C) 98.4 F (36.9 C)  TempSrc:  Axillary Axillary Oral Oral  SpO2: 96% 100% 100% 100%  Weight:      Height:        Intake/Output Summary (Last 24 hours) at 12/29/2022 1129 Last data filed at 12/29/2022 0500 Gross per 24 hour  Intake 600 ml  Output --  Net 600 ml   Filed Weights   12/26/22 0500 12/27/22 0500 12/28/22 0455  Weight: 62.2 kg 63 kg 61 kg    Examination:  Physical Exam Vitals and nursing note reviewed.  Constitutional:      General: She is not in acute distress.    Comments: Chronically ill appearing  HENT:     Head: Normocephalic and atraumatic.  Cardiovascular:     Rate and Rhythm: Normal rate and regular rhythm.  Pulmonary:     Comments: Chronic dry rales No wheezing Abdominal:     General: Abdomen is flat. Bowel sounds are normal.  Musculoskeletal:     Right lower leg: No edema.     Left lower leg: No edema.  Skin:    General: Skin  is warm.     Capillary Refill: Capillary refill takes less than 2 seconds.  Neurological:     Mental Status: She is alert and oriented to person, place, and time.     Data Reviewed: I have personally reviewed following labs and imaging studies  CBC: Recent Labs  Lab 12/23/22 0654 12/24/22 0615 12/25/22 0535 12/25/22 0901 12/26/22 0536 12/27/22 0725  WBC 7.1 8.0 7.3  --  7.6 7.7  NEUTROABS 4.6  --   --   --   --   --   HGB 8.3* 7.7* 6.9* 7.9* 7.5* 7.8*  HCT 25.0* 23.9* 20.8* 24.6* 22.6* 24.1*  MCV 97.7 96.4 96.7  --  97.4 98.4  PLT 146* 224 205  --  208 242   Basic Metabolic Panel: Recent Labs  Lab 12/25/22 0535 12/26/22 0536 12/27/22 0725 12/28/22 0704 12/29/22 0452  NA 136 141 141 140 141  K 3.4* 4.6 3.9 4.0 3.8  CL 107 109 107 106 104  CO2 22 22 26 25 27   GLUCOSE 110* 129* 159* 153* 132*  BUN 25* 29* 29* 29* 28*  CREATININE 3.13* 3.18* 3.40* 3.42* 3.14*  CALCIUM 7.3* 7.7* 8.2* 7.9* 7.9*  MG  --   --   --  1.3*  --    GFR: Estimated Creatinine Clearance: 10.5 mL/min (A) (by C-G formula based on SCr of 3.14 mg/dL (H)). Liver Function Tests: Recent Labs  Lab 12/28/22 0704 12/29/22 0452  AST 18 18  ALT 14 13  ALKPHOS 52 51  BILITOT 0.5 0.5  PROT 5.5* 5.5*  ALBUMIN 2.7* 2.6*   BNP (last 3 results) Recent Labs    12/27/22 0725 12/28/22 0704 12/29/22 0452  BNP 611.4* 509.4* 469.9*   CBG: Recent Labs  Lab 12/28/22 0729 12/28/22 1333 12/28/22 1631 12/28/22 1940 12/29/22 0638  GLUCAP 146* 291* 147* 112* 135*   Lipid Profile: Recent Labs    12/29/22 0452  CHOL 95  HDL 35*  LDLCALC 37  TRIG 562  CHOLHDL 2.7    Radiology Studies: MR BRAIN WO CONTRAST  Result Date: 12/29/2022 CLINICAL DATA:  Initial evaluation for acute neuro deficit, stroke suspected. EXAM: MRI HEAD WITHOUT CONTRAST MRA HEAD WITHOUT CONTRAST MRA NECK WITHOUT CONTRAST TECHNIQUE: Multiplanar, multiecho pulse sequences of the brain and surrounding structures were obtained  without intravenous contrast. Angiographic images of the Circle of Willis were obtained using MRA technique without intravenous contrast. Angiographic  images of the neck were obtained using MRA technique without intravenous contrast. Carotid stenosis measurements (when applicable) are obtained utilizing NASCET criteria, using the distal internal carotid diameter as the denominator. COMPARISON:  CT from earlier the same day. FINDINGS: MRI HEAD FINDINGS Brain: Cerebral volume within normal limits. Patchy and confluent T2/FLAIR hyperintensity involving the periventricular and deep white matter both cerebral hemispheres, most like related chronic microvascular ischemic disease, moderately advanced in nature. Encephalomalacia and gliosis with associated chronic hemosiderin staining noted at the right temporoccipital junction. Few additional scattered foci of chronic hemosiderin staining noted about the left occipital and right frontal lobes, suggesting prior hemorrhage at these locations. Few scattered superimposed chronic micro hemorrhages, most likely hypertensive in nature. No abnormal foci of restricted diffusion to suggest acute or subacute ischemia. Gray-white matter differentiation maintained. No acute intracranial hemorrhage. No mass lesion, mass effect or midline shift. Mild ex vacuo dilatation of the right lateral ventricle without hydrocephalus. No extra-axial fluid collection. Pituitary gland and suprasellar region within normal limits. Vascular: Major intracranial vascular flow voids are maintained. Skull and upper cervical spine: Craniocervical junction with level limits. Bone marrow signal intensity normal. No scalp soft tissue abnormality. Sinuses/Orbits: Prior bilateral ocular lens replacement. Mild mucosal thickening noted about the paranasal sinuses. No air-fluid levels. Trace left mastoid effusion, of doubtful significance. Other: None. MRA HEAD FINDINGS ANTERIOR CIRCULATION: Atheromatous change about  the left carotid siphon with associated short-segment moderate to severe stenosis at the proximal cavernous segment (series 1, image 84). No stenosis about the contralateral right siphon. A1 segments patent bilaterally. Normal anterior communicating artery complex. Anterior cerebral arteries patent without stenosis. No M1 stenosis or occlusion. Distal MCA branches perfused and symmetric. POSTERIOR CIRCULATION: Both V4 segments patent without stenosis. Both PICA patent. Basilar patent without stenosis. Superior cerebellar and posterior cerebral arteries patent bilaterally without significant stenosis. Anatomic variants: None significant.  No intracranial aneurysm. MRA NECK FINDINGS AORTIC ARCH: Partially visualized aortic arch within normal limits for caliber with standard branch pattern. No visible stenosis about the origin of the great vessels. RIGHT CAROTID SYSTEM: Right common and internal carotid arteries are patent with antegrade flow. No evidence for dissection. Mild atheromatous irregularity about the right carotid bulb without hemodynamically significant greater than 50% stenosis. LEFT CAROTID SYSTEM: Left common and internal carotid arteries are patent with antegrade flow. No evidence for dissection. Atheromatous irregularity about the left carotid bulb without hemodynamically significant greater than 50% stenosis. VERTEBRAL ARTERIES: Both vertebral arteries arise from the subclavian arteries. Proximal left vertebral artery tortuous and intermittently not seen, which could be related to severe stenoses and/or possible proximal occlusion. Left vertebral artery patent with antegrade flow extending from the distal left V1 segment to the skull base. Right vertebral artery patent without stenosis. No visible evidence for dissection. Other: None. IMPRESSION: MRI HEAD: 1. No acute intracranial abnormality. 2. 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 Few  additional scattered foci of chronic hemosiderin staining about the left occipital and right frontal lobes, suggesting prior hemorrhage at these locations. 3. Underlying moderate chronic microvascular ischemic disease. MRA HEAD: 1. Negative intracranial MRA for large vessel occlusion. 2. Atheromatous change about the left carotid siphon with associated short-segment moderate to severe stenosis at the proximal cavernous segment. 3. Otherwise wide patency of the major intracranial arterial vasculature. MRA NECK: 1. Atheromatous change about the carotid bifurcations without hemodynamically significant greater than 50% stenosis. 2. Proximal left vertebral artery tortuous and intermittently not seen, which could be related to  underlying stenoses and/or possible proximal occlusion. Left vertebral artery otherwise patent extending from the distal left V1 segment to the skull base. 3. Wide patency of the right vertebral artery without stenosis. Electronically Signed   By: Rise Mu M.D.   On: 12/29/2022 06:01   MR ANGIO HEAD WO CONTRAST  Result Date: 12/29/2022 CLINICAL DATA:  Initial evaluation for acute neuro deficit, stroke suspected. EXAM: MRI HEAD WITHOUT CONTRAST MRA HEAD WITHOUT CONTRAST MRA NECK WITHOUT CONTRAST TECHNIQUE: Multiplanar, multiecho pulse sequences of the brain and surrounding structures were obtained without intravenous contrast. Angiographic images of the Circle of Willis were obtained using MRA technique without intravenous contrast. Angiographic images of the neck were obtained using MRA technique without intravenous contrast. Carotid stenosis measurements (when applicable) are obtained utilizing NASCET criteria, using the distal internal carotid diameter as the denominator. COMPARISON:  CT from earlier the same day. FINDINGS: MRI HEAD FINDINGS Brain: Cerebral volume within normal limits. Patchy and confluent T2/FLAIR hyperintensity involving the periventricular and deep white matter  both cerebral hemispheres, most like related chronic microvascular ischemic disease, moderately advanced in nature. Encephalomalacia and gliosis with associated chronic hemosiderin staining noted at the right temporoccipital junction. Few additional scattered foci of chronic hemosiderin staining noted about the left occipital and right frontal lobes, suggesting prior hemorrhage at these locations. Few scattered superimposed chronic micro hemorrhages, most likely hypertensive in nature. No abnormal foci of restricted diffusion to suggest acute or subacute ischemia. Gray-white matter differentiation maintained. No acute intracranial hemorrhage. No mass lesion, mass effect or midline shift. Mild ex vacuo dilatation of the right lateral ventricle without hydrocephalus. No extra-axial fluid collection. Pituitary gland and suprasellar region within normal limits. Vascular: Major intracranial vascular flow voids are maintained. Skull and upper cervical spine: Craniocervical junction with level limits. Bone marrow signal intensity normal. No scalp soft tissue abnormality. Sinuses/Orbits: Prior bilateral ocular lens replacement. Mild mucosal thickening noted about the paranasal sinuses. No air-fluid levels. Trace left mastoid effusion, of doubtful significance. Other: None. MRA HEAD FINDINGS ANTERIOR CIRCULATION: Atheromatous change about the left carotid siphon with associated short-segment moderate to severe stenosis at the proximal cavernous segment (series 1, image 84). No stenosis about the contralateral right siphon. A1 segments patent bilaterally. Normal anterior communicating artery complex. Anterior cerebral arteries patent without stenosis. No M1 stenosis or occlusion. Distal MCA branches perfused and symmetric. POSTERIOR CIRCULATION: Both V4 segments patent without stenosis. Both PICA patent. Basilar patent without stenosis. Superior cerebellar and posterior cerebral arteries patent bilaterally without significant  stenosis. Anatomic variants: None significant.  No intracranial aneurysm. MRA NECK FINDINGS AORTIC ARCH: Partially visualized aortic arch within normal limits for caliber with standard branch pattern. No visible stenosis about the origin of the great vessels. RIGHT CAROTID SYSTEM: Right common and internal carotid arteries are patent with antegrade flow. No evidence for dissection. Mild atheromatous irregularity about the right carotid bulb without hemodynamically significant greater than 50% stenosis. LEFT CAROTID SYSTEM: Left common and internal carotid arteries are patent with antegrade flow. No evidence for dissection. Atheromatous irregularity about the left carotid bulb without hemodynamically significant greater than 50% stenosis. VERTEBRAL ARTERIES: Both vertebral arteries arise from the subclavian arteries. Proximal left vertebral artery tortuous and intermittently not seen, which could be related to severe stenoses and/or possible proximal occlusion. Left vertebral artery patent with antegrade flow extending from the distal left V1 segment to the skull base. Right vertebral artery patent without stenosis. No visible evidence for dissection. Other: None. IMPRESSION: MRI HEAD: 1. No acute intracranial abnormality.  2. 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 Few additional scattered foci of chronic hemosiderin staining about the left occipital and right frontal lobes, suggesting prior hemorrhage at these locations. 3. Underlying moderate chronic microvascular ischemic disease. MRA HEAD: 1. Negative intracranial MRA for large vessel occlusion. 2. Atheromatous change about the left carotid siphon with associated short-segment moderate to severe stenosis at the proximal cavernous segment. 3. Otherwise wide patency of the major intracranial arterial vasculature. MRA NECK: 1. Atheromatous change about the carotid bifurcations without hemodynamically  significant greater than 50% stenosis. 2. Proximal left vertebral artery tortuous and intermittently not seen, which could be related to underlying stenoses and/or possible proximal occlusion. Left vertebral artery otherwise patent extending from the distal left V1 segment to the skull base. 3. Wide patency of the right vertebral artery without stenosis. Electronically Signed   By: Rise Mu M.D.   On: 12/29/2022 06:01   MR ANGIO NECK WO CONTRAST  Result Date: 12/29/2022 CLINICAL DATA:  Initial evaluation for acute neuro deficit, stroke suspected. EXAM: MRI HEAD WITHOUT CONTRAST MRA HEAD WITHOUT CONTRAST MRA NECK WITHOUT CONTRAST TECHNIQUE: Multiplanar, multiecho pulse sequences of the brain and surrounding structures were obtained without intravenous contrast. Angiographic images of the Circle of Willis were obtained using MRA technique without intravenous contrast. Angiographic images of the neck were obtained using MRA technique without intravenous contrast. Carotid stenosis measurements (when applicable) are obtained utilizing NASCET criteria, using the distal internal carotid diameter as the denominator. COMPARISON:  CT from earlier the same day. FINDINGS: MRI HEAD FINDINGS Brain: Cerebral volume within normal limits. Patchy and confluent T2/FLAIR hyperintensity involving the periventricular and deep white matter both cerebral hemispheres, most like related chronic microvascular ischemic disease, moderately advanced in nature. Encephalomalacia and gliosis with associated chronic hemosiderin staining noted at the right temporoccipital junction. Few additional scattered foci of chronic hemosiderin staining noted about the left occipital and right frontal lobes, suggesting prior hemorrhage at these locations. Few scattered superimposed chronic micro hemorrhages, most likely hypertensive in nature. No abnormal foci of restricted diffusion to suggest acute or subacute ischemia. Gray-white matter  differentiation maintained. No acute intracranial hemorrhage. No mass lesion, mass effect or midline shift. Mild ex vacuo dilatation of the right lateral ventricle without hydrocephalus. No extra-axial fluid collection. Pituitary gland and suprasellar region within normal limits. Vascular: Major intracranial vascular flow voids are maintained. Skull and upper cervical spine: Craniocervical junction with level limits. Bone marrow signal intensity normal. No scalp soft tissue abnormality. Sinuses/Orbits: Prior bilateral ocular lens replacement. Mild mucosal thickening noted about the paranasal sinuses. No air-fluid levels. Trace left mastoid effusion, of doubtful significance. Other: None. MRA HEAD FINDINGS ANTERIOR CIRCULATION: Atheromatous change about the left carotid siphon with associated short-segment moderate to severe stenosis at the proximal cavernous segment (series 1, image 84). No stenosis about the contralateral right siphon. A1 segments patent bilaterally. Normal anterior communicating artery complex. Anterior cerebral arteries patent without stenosis. No M1 stenosis or occlusion. Distal MCA branches perfused and symmetric. POSTERIOR CIRCULATION: Both V4 segments patent without stenosis. Both PICA patent. Basilar patent without stenosis. Superior cerebellar and posterior cerebral arteries patent bilaterally without significant stenosis. Anatomic variants: None significant.  No intracranial aneurysm. MRA NECK FINDINGS AORTIC ARCH: Partially visualized aortic arch within normal limits for caliber with standard branch pattern. No visible stenosis about the origin of the great vessels. RIGHT CAROTID SYSTEM: Right common and internal carotid arteries are patent with antegrade flow. No evidence for dissection. Mild atheromatous irregularity  about the right carotid bulb without hemodynamically significant greater than 50% stenosis. LEFT CAROTID SYSTEM: Left common and internal carotid arteries are patent with  antegrade flow. No evidence for dissection. Atheromatous irregularity about the left carotid bulb without hemodynamically significant greater than 50% stenosis. VERTEBRAL ARTERIES: Both vertebral arteries arise from the subclavian arteries. Proximal left vertebral artery tortuous and intermittently not seen, which could be related to severe stenoses and/or possible proximal occlusion. Left vertebral artery patent with antegrade flow extending from the distal left V1 segment to the skull base. Right vertebral artery patent without stenosis. No visible evidence for dissection. Other: None. IMPRESSION: MRI HEAD: 1. No acute intracranial abnormality. 2. 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 Few additional scattered foci of chronic hemosiderin staining about the left occipital and right frontal lobes, suggesting prior hemorrhage at these locations. 3. Underlying moderate chronic microvascular ischemic disease. MRA HEAD: 1. Negative intracranial MRA for large vessel occlusion. 2. Atheromatous change about the left carotid siphon with associated short-segment moderate to severe stenosis at the proximal cavernous segment. 3. Otherwise wide patency of the major intracranial arterial vasculature. MRA NECK: 1. Atheromatous change about the carotid bifurcations without hemodynamically significant greater than 50% stenosis. 2. Proximal left vertebral artery tortuous and intermittently not seen, which could be related to underlying stenoses and/or possible proximal occlusion. Left vertebral artery otherwise patent extending from the distal left V1 segment to the skull base. 3. Wide patency of the right vertebral artery without stenosis. Electronically Signed   By: Rise Mu M.D.   On: 12/29/2022 06:01   CT HEAD CODE STROKE WO CONTRAST  Result Date: 12/28/2022 CLINICAL DATA:  Code stroke. Initial evaluation for acute neuro deficit, stroke. EXAM: CT  HEAD WITHOUT CONTRAST TECHNIQUE: Contiguous axial images were obtained from the base of the skull through the vertex without intravenous contrast. RADIATION DOSE REDUCTION: This exam was performed according to the departmental dose-optimization program which includes automated exposure control, adjustment of the mA and/or kV according to patient size and/or use of iterative reconstruction technique. COMPARISON:  Prior study from 01/11/2022. FINDINGS: Brain: Cerebral volume within normal limits. Moderate chronic microvascular ischemic disease. Chronic encephalomalacia at the right occipital lobe, stable. No acute intracranial hemorrhage. No visible acute large vessel territory infarct. No mass lesion or midline shift. No hydrocephalus or extra-axial fluid collection. Vascular: No abnormal hyperdense vessel. Scattered vascular calcifications noted within the carotid siphons. Skull: Scalp soft tissues within normal limits.  Calvarium intact. Sinuses/Orbits: Globes and orbital soft tissues within normal limits. Paranasal sinuses are largely clear. No significant mastoid effusion. Other: None. ASPECTS [facility] Stroke Program Early CT Score) - Ganglionic level infarction (caudate, lentiform nuclei, internal capsule, insula, M1-M3 cortex): 7 - Supraganglionic infarction (M4-M6 cortex): 3 Total score (0-10 with 10 being normal): 10 IMPRESSION: 1. No acute intracranial abnormality. 2. ASPECTS is 10. 3. Moderate chronic microvascular ischemic disease with chronic encephalomalacia at the right occipital lobe, stable. These results were communicated to Dr. Otelia Limes at 8:12 pm on 12/28/2022 by text page via the [facility] messaging system. Electronically Signed   By: Rise Mu M.D.   On: 12/28/2022 20:14    Scheduled Meds:  aspirin EC  81 mg Oral Daily   atorvastatin  80 mg Oral QPM   citalopram  10 mg Oral Daily   clopidogrel  75 mg Oral Daily   vitamin B-12  1,000 mcg Oral Daily   famotidine  20 mg Oral QHS    feeding supplement  1 Container Oral TID BM   feeding supplement  237 mL Oral BID BM   heparin  5,000 Units Subcutaneous Q8H   hydrALAZINE  25 mg Oral TID   insulin aspart  0-5 Units Subcutaneous QHS   insulin aspart  0-9 Units Subcutaneous TID WC   metoprolol succinate  50 mg Oral Daily   sodium bicarbonate  1,300 mg Oral TID   Continuous Infusions:   LOS: 10 days   Time spent: 40 minutes  Carollee Herter, DO  [facility]  12/29/2022, 11:29 AM

## 2022-12-29 NOTE — Progress Notes (Signed)
SLP Cancellation Note  Patient Details Name: Kyia Braniff MRN: [id number] DOB: 08-16-1933   Cancelled treatment:       Reason Eval/Treat Not Completed: SLP screened, no needs identified, will sign off. SLP orders received for cognitive-linguistic evaluation. MRI completed and revealed no acute intracranial abnormality. Pt denied acute changes to speech, language, or cognition at this time. A formal cognitive-linguistic assessment is not indicated. SLP will sign off.    Ellery Plunk 12/29/2022, 9:23 AM

## 2022-12-29 NOTE — Discharge Summary (Signed)
[facility] Physician Discharge Summary   Patient name: Sandra [name]  Admit date:     12/19/2022  Discharge date: 12/29/2022  Attending Physician: Steffanie Rainwater [[id number]]  Discharge Physician: Carollee Herter   PCP: [name], NP  Admitted From: Home  Disposition:  Home  Recommendations for Outpatient Follow-up:  Follow up with PCP in 1-2 weeks Follow up with nephrology in 1-2 weeks  Home Health:Yes Equipment/Devices: Home nebulizer machine  Discharge Condition:Stable CODE STATUS:DNR/DNI Diet recommendation: Renal Fluid Restriction: 1200 ml/day  Hospital Summary: HPI: 89 yof w/ DM2, HTN, HLD, CKD 4 b/l ~ 2.3, TIA, anxiety/depression, GERD who was seen in the ED for abdominal pain, diarrhea.CT abdomen showed acute uncomplicated colitis involving the descending colon and proximal sigmoid colon. She did not meet criteria for admission and was discharged on a course of ciprofloxacin and Flagyl. Despite continuation of antibiotics,patient continued with nausea, vomiting, poor oral intake and generalized weakness and hence returned to ED on 11/30 again. In the ED,patient was afebrile,HR 60s,BP 130s, RA. Labs with WBC- 8,HB 8.8, creatinine significant elevated to 58/5 compared to a baseline of 32/2.74 last week. Started on IV fluid and admitted for further management. Resuming amaryl at 1 mg dose to make sure she can tolerate  before discharge   Significant Events: Admitted 12/19/2022 acute on chronic kidney disease stage 4   Significant Labs: Admission WBC 8, Hgb 8.8, Na 141, K 4.3, bicarb 16, BUN 53, Scr 4.21 B12 238(nl) Folate 7.9(nl) Iron 111(nl), TIBC 196(low), %sat 57 (high), ferritin 192(nl)  Significant Imaging Studies: Admission Renal U/S shows Normal ultrasound appearance of the kidneys. No mass or hydronephrosis identified. Echo showed normal LVEF 60%. No intracardiac shunt.  Antibiotic Therapy: Anti-infectives (From admission, onward)    Start      Dose/Rate Route Frequency Ordered Stop   12/20/22 0030  cefTRIAXone (ROCEPHIN) 2 g in sodium chloride 0.9 % 100 mL IVPB        2 g 200 mL/hr over 30 Minutes Intravenous Daily at bedtime 12/20/22 0021 12/20/22 2243   12/20/22 0030  metroNIDAZOLE (FLAGYL) IVPB 500 mg        500 mg 100 mL/hr over 60 Minutes Intravenous 2 times daily 12/20/22 0021 12/21/22 1028       Procedures:   Consultants:    Hospital Course by Problem: * Acute renal failure superimposed on stage 5 chronic kidney disease, not on chronic dialysis (HCC) 12-19-2022 through 12-26-2022 AKI prerenal in the setting of dehydration poor oral intake b/l creat 2.1, In Nov 30, 2022 at [facility] kidney office. PTA on prn lasix lisinopril and on hold.Bladder scan and renal ultrasound ruled out obstructive uropathy.  Received IV fluids initially with improvement from 5-now holding in 3 range. She needed lasix for shortness of breath 20mg  x 2 with Bnp in 500s on 12/4. Patient creatinine has significantly improved from admission value now plateaued at 3.1 -discussed w/ [name] from nephrology-reviewed the lab work, he will arrange outpatient follow-up next week with [name] and okay for discharge Continue oral bicarb encourage oral hydration  12-27-2022 pt with clear respiratory distress. BNP elevated to 611 and CXR shows diffuse interstitial lung disease. However, bedside thoracic U/S with multiple B-lines consistent with pulmonary edema. Pt did urinate about 250 ml after IV lasix. Discussed with dtr [name] that aggressive diuresis cannot be used in patient due to CKD stage 5. Pt is not HD candidate. Dtr does not seem to understand that pt has CKD stage 5 and there  is not much that can be done about pt's fluid retention. Asked pt not to drink too much water.  12-28-2022 pt improved with 80 mg IV lasix x 1 yesterday. Only recorded 525 ml urine yesterday. But now on RA. Scr has increased to 3.42. BUN 29.  Pt's son seems to understand that  pt's CKD is severe. That she is not a HD candidate and that if she gets any worse, family will need to consider palliative care. Will monitor pt today. Repeat BMP in AM. Consider DC in AM. Told son that family needs to go out today and purchase a digital scale for pt's use at home. Pt states she already has home health RN at home.  12-29-2022 Scr has improved to 3.14, BUN 28. BNP 469. This is likely her baseline BNP. Weight yesterday 134.48 lbs. Pt will be discharged to home with prn lasix 20 mg. Take if baseline weight 3 lbs over dry weight. Pt needs close f/u with nephrology as outpatient.  ILD (interstitial lung disease) (HCC) 12-27-2022 chronic. Mild pulmonary edema. Diuresis today with 1 dose of IV lasix. Continue with duonebs qid.  12-29-2022 reviewed her chart. Pt previously seen by pulmonology in 2021. " Patient has underlying interstitial lung disease.  Felt to have progressive fibrosis with increased symptom burden.  She was started on Ofev November 2020.  Patient says she has been unable to tolerate this.  She has been taking but it has caused significant diarrhea.  Last visit Ofev dose was decreased to 100 mg twice daily.  Patient says she did not pick this up and does not plan on taking this again.  She has had very poor quality of life and does not wish to restart this.  Previous visit patient was recommended to also consider CellCept along with Bactrim.  We discussed this and other treatment options.  Patient says she does not like the side effects and currently prefers quality of life versus suffering from medication side effects".  Appears that pt had been lost to followup since 2021. Pt never went back to see pulmonology in regards to her ILD. It appears that her ILD has progressed since she voluntarily decided to stop treatment 3 years ago. Appears she is paying the price now for that decision. Unfortunately, not much can be done about it now. Will try and get her home O2. She would  benefit from outpatient palliative care consult as I predict she is only going to get sicker and sicker in the future.  Malnutrition of moderate degree 12-27-2022. See RD noted from 12-22-2022. Moderate Malnutrition related to acute illness as evidenced by estimated needs, moderate fat depletion, moderate muscle depletion.   DNR (do not resuscitate) 12-27-2022 pt made DNR on admission.   Uncontrolled type 2 diabetes mellitus with hyperglycemia (HCC) 12-27-2022 add SSI. Stop glipizide due to CKD stage 5  Type 2 diabetes mellitus with hyperglycemia (HCC) 12-27-2022 given her CKD stage 5, will stop glipizide. Place on SSI.  Essential hypertension 12-27-2022 will stop norvasc as it can increase her peripheral edema. Continue with lopressor.  12-28-2022 add hydralazine 10 mg tid for additional BP control.  12-29-2022 continue with toprol-xl 50 mg daily, hydralazine 25 mg tid.  AMS (altered mental status) 12-29-2022 code stroke called last night. Pt seen by neurology. MRI brain negative for acute CVA. 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 Echo showed normal LEVF of 60%. No intra-cardiac thrombus. No shunt. Neurology felt that pt did  not have acute CVA or TIA. Her event yesterday likely due to panic attack.    Discharge Diagnoses:  Principal Problem:   Acute renal failure superimposed on stage 5 chronic kidney disease, not on chronic dialysis (HCC) Active Problems:   ILD (interstitial lung disease) (HCC)   Hyperlipidemia associated with type 2 diabetes mellitus (HCC)   Essential hypertension   Type 2 diabetes mellitus with hyperglycemia (HCC)   Uncontrolled type 2 diabetes mellitus with hyperglycemia (HCC)   DNR (do not resuscitate)   Malnutrition of moderate degree   AMS (altered mental status)   Discharge Instructions  Discharge Instructions     (HEART FAILURE PATIENTS) Call MD:  Anytime you have any of the  following symptoms: 1) 3 pound weight gain in 24 hours or 5 pounds in 1 week 2) shortness of breath, with or without a dry hacking cough 3) swelling in the hands, feet or stomach 4) if you have to sleep on extra pillows at night in order to breathe.   Complete by: As directed    Call MD for:  extreme fatigue   Complete by: As directed    Call MD for:  persistant dizziness or light-headedness   Complete by: As directed    Call MD for:  severe uncontrolled pain   Complete by: As directed    Call MD for:  temperature >100.4   Complete by: As directed    Diet renal/carb modified with fluid restriction   Complete by: As directed    40 ounce per day fluid restriction 2000 milligram per day salt restriction   Discharge instructions   Complete by: As directed    1. Follow up with your primary care provider in 1 week following discharge 2. Follow up with your nephrologist(dr. Coladonato) within 1-2 weeks following discharge. 3. You are to take lasix if your baseline weight is 3 lbs or more in 1 day OR 5 lbs in 1 week. 4. Take your baseline weight as soon as you get home today.  First go and empty your bladder. Then stand completely naked on a scale and weigh yourself. This will be your baseline weight.  WRITE THIS NUMBER DOWN.  All other weights will be in comparison to your baseline weight.  You weighed 134.5 lbs today at the hospital. 5. EVERYDAY. Get out of bed and empty your bladder. Then stand on your scale completely naked and weigh yourself.  You will compare your daily weight against your "baseline" weight.  If your weight is 3 lbs or more than your baseline weight, you will need to take lasix 20 mg that day.  If you need to take lasix 3 days in a row due to higher weights, you will need to call your primary care provider or your nephrologist for an appointment and blood work.   Increase activity slowly   Complete by: As directed       Allergies as of 12/29/2022       Reactions    Cephalexin Nausea And Vomiting   Pt stated made severely sick, will never take again   Pioglitazone Swelling   REACTION: EDEMA   Nintedanib Diarrhea   Sertraline Nausea And Vomiting        Medication List     STOP taking these medications    amLODipine 5 MG tablet Commonly known as: NORVASC   ciprofloxacin 500 MG tablet Commonly known as: CIPRO   glimepiride 2 MG tablet Commonly known as: AMARYL   lisinopril 5 MG  tablet Commonly known as: ZESTRIL   metroNIDAZOLE 500 MG tablet Commonly known as: FLAGYL       TAKE these medications    acetaminophen 500 MG tablet Commonly known as: TYLENOL Take 500 mg by mouth every 6 (six) hours as needed for mild pain or headache.   aspirin EC 81 MG tablet Take 81 mg by mouth daily. Swallow whole.   atorvastatin 80 MG tablet Commonly known as: LIPITOR TAKE 1 TABLET EVERY DAY What changed: when to take this   citalopram 10 MG tablet Commonly known as: CELEXA TAKE 1 TABLET BY MOUTH EVERY DAY   clonazePAM 0.5 MG tablet Commonly known as: KLONOPIN Take 1 tablet (0.5 mg total) by mouth 2 (two) times daily as needed for anxiety.   clopidogrel 75 MG tablet Commonly known as: PLAVIX Take 1 tablet (75 mg total) by mouth daily.   cyanocobalamin 1000 MCG tablet Take 1 tablet (1,000 mcg total) by mouth daily. Start taking on: December 30, 2022   famotidine 20 MG tablet Commonly known as: PEPCID Take 2 tablets (40 mg total) by mouth daily. What changed: medication strength   furosemide 20 MG tablet Commonly known as: LASIX Take 1 tablet (20 mg total) by mouth daily as needed. Take only if weight is +3 lbs greater than baseline weight in 1 day or +5 lbs in 1 week What changed: additional instructions   hydrALAZINE 25 MG tablet Commonly known as: APRESOLINE Take 1 tablet (25 mg total) by mouth 3 (three) times daily.   ipratropium-albuterol 0.5-2.5 (3) MG/3ML Soln Commonly known as: DUONEB Take 3 mLs by nebulization every  6 (six) hours as needed.   metoprolol succinate 50 MG 24 hr tablet Commonly known as: TOPROL-XL Take 1 tablet (50 mg total) by mouth daily. Take with or immediately following a meal.   NovoLOG FlexPen 100 UNIT/ML FlexPen Generic drug: insulin aspart Inject 6-8 Units into the skin 3 (three) times daily before meals. Pt completes sliding scale.   sodium bicarbonate 650 MG tablet Take 2 tablets (1,300 mg total) by mouth 2 (two) times daily.               Durable Medical Equipment  (From admission, onward)           Start     Ordered   12/29/22 1441  For home use only DME Nebulizer machine  Once       Question Answer Comment  Patient needs a nebulizer to treat with the following condition Pulmonary fibrosis (HCC)   Length of Need Lifetime      12/29/22 1440   12/29/22 0825  For home use only DME Nebulizer machine  Once       Question Answer Comment  Patient needs a nebulizer to treat with the following condition Interstitial lung disease (HCC)   Length of Need Lifetime      12/29/22 0824            Follow-up Information     [facility], [facility] Follow up.   Specialty: Home Health Services Why: [facility] will be providing home health services for PT, RN.  They will call you in the next 24-48 hours to set up services. Contact information: [address] [place] [address] [phone]                Allergies  Allergen Reactions   Cephalexin Nausea And Vomiting    Pt stated made severely sick, will never take again  Pioglitazone Swelling    REACTION: EDEMA   Nintedanib Diarrhea   Sertraline Nausea And Vomiting    Discharge Exam: Vitals:   12/29/22 0750 12/29/22 1127  BP: (!) 158/61 (!) 153/55  Pulse: 64 65  Resp: 19 17  Temp: 98.5 F (36.9 C) 98.4 F (36.9 C)  SpO2: 100% 100%   Weight Information (since admission)     Date/Time Weight Weight in lbs BSA (Calculated - sq m) BMI (Calculated) Who   12/28/22 04:55:18 61  kg 134.48 lbs -- 26.26 KS   12/27/22 0500 63 kg 138.89 lbs -- 27.13 SD   12/26/22 0500 62.2 kg 137.13 lbs -- 26.78 XS   12/25/22 0500 58.8 kg 129.63 lbs -- 25.32 SD   12/24/22 0454 61.7 kg 136.02 lbs -- 26.57 TN   12/23/22 0500 60.5 kg 133.38 lbs -- 26.05 MS   12/22/22 0500 60.9 kg 134.26 lbs -- 26.22 TW   12/21/22 0435 60.6 kg 133.6 lbs -- 26.09 GT   12/20/22 05:11:19 60.4 kg 133.16 lbs -- 26.01 VN   12/19/22 1222 55 kg 121.25 lbs 1.53 sq meters 23.68 JC       Physical Exam Vitals and nursing note reviewed.  Constitutional:      General: She is not in acute distress.    Comments: Chronically ill appearing  HENT:     Head: Normocephalic and atraumatic.  Cardiovascular:     Rate and Rhythm: Normal rate and regular rhythm.  Pulmonary:     Comments: Chronic dry rales No wheezing Abdominal:     General: Abdomen is flat. Bowel sounds are normal.  Musculoskeletal:     Right lower leg: No edema.     Left lower leg: No edema.  Skin:    General: Skin is warm.     Capillary Refill: Capillary refill takes less than 2 seconds.  Neurological:     Mental Status: She is alert and oriented to person, place, and time.     The results of significant diagnostics from this hospitalization (including imaging, microbiology, ancillary and laboratory) are listed below for reference.     Labs: BNP (last 3 results) Recent Labs    12/27/22 0725 12/28/22 0704 12/29/22 0452  BNP 611.4* 509.4* 469.9*   Basic Metabolic Panel: Recent Labs  Lab 12/25/22 0535 12/26/22 0536 12/27/22 0725 12/28/22 0704 12/29/22 0452  NA 136 141 141 140 141  K 3.4* 4.6 3.9 4.0 3.8  CL 107 109 107 106 104  CO2 22 22 26 25 27   GLUCOSE 110* 129* 159* 153* 132*  BUN 25* 29* 29* 29* 28*  CREATININE 3.13* 3.18* 3.40* 3.42* 3.14*  CALCIUM 7.3* 7.7* 8.2* 7.9* 7.9*  MG  --   --   --  1.3*  --    Liver Function Tests: Recent Labs  Lab 12/28/22 0704 12/29/22 0452  AST 18 18  ALT 14 13  ALKPHOS 52 51   BILITOT 0.5 0.5  PROT 5.5* 5.5*  ALBUMIN 2.7* 2.6*   CBC: Recent Labs  Lab 12/23/22 0654 12/24/22 0615 12/25/22 0535 12/25/22 0901 12/26/22 0536 12/27/22 0725  WBC 7.1 8.0 7.3  --  7.6 7.7  NEUTROABS 4.6  --   --   --   --   --   HGB 8.3* 7.7* 6.9* 7.9* 7.5* 7.8*  HCT 25.0* 23.9* 20.8* 24.6* 22.6* 24.1*  MCV 97.7 96.4 96.7  --  97.4 98.4  PLT 146* 224 205  --  208 242   BNP: Recent Labs  Lab 12/23/22 0654 12/27/22 0725 12/28/22 0704 12/29/22 0452  BNP 549.7* 611.4* 509.4* 469.9*   CBG: Recent Labs  Lab 12/28/22 1333 12/28/22 1631 12/28/22 1940 12/29/22 0638 12/29/22 1129  GLUCAP 291* 147* 112* 135* 316*   Lipid Profile Recent Labs    12/29/22 0452  CHOL 95  HDL 35*  LDLCALC 37  TRIG 409  CHOLHDL 2.7   Urinalysis    Component Value Date/Time   COLORURINE YELLOW 12/20/2022 1710   APPEARANCEUR CLEAR 12/20/2022 1710   LABSPEC 1.012 12/20/2022 1710   PHURINE 5.0 12/20/2022 1710   GLUCOSEU 150 (A) 12/20/2022 1710   GLUCOSEU >=1000 (A) 12/14/2016 1058   HGBUR SMALL (A) 12/20/2022 1710   HGBUR moderate 10/31/2009 0958   BILIRUBINUR NEGATIVE 12/20/2022 1710   BILIRUBINUR negative 10/13/2016 1059   KETONESUR NEGATIVE 12/20/2022 1710   PROTEINUR 30 (A) 12/20/2022 1710   UROBILINOGEN 0.2 12/14/2016 1058   NITRITE NEGATIVE 12/20/2022 1710   LEUKOCYTESUR NEGATIVE 12/20/2022 1710   Sepsis Labs Recent Labs  Lab 12/24/22 0615 12/25/22 0535 12/26/22 0536 12/27/22 0725  WBC 8.0 7.3 7.6 7.7   Procedures/Studies: ECHOCARDIOGRAM COMPLETE  Result Date: 12/29/2022    ECHOCARDIOGRAM REPORT   Patient Name:   LITONYA DROEGE Date of Exam: 12/29/2022 Medical Rec #:  [id number]     Height:       60.0 in Accession #:    [id number]    Weight:       134.5 lb Date of Birth:  [birth year]    BSA:          1.577 m Patient Age:    88 years      BP:           158/61 mmHg Patient Gender: F             HR:           64 bpm. Exam Location:  Inpatient Procedure: 2D Echo, Cardiac  Doppler and Color Doppler Indications:    TIA G45.9  History:        Patient has prior history of Echocardiogram examinations, most                 recent 01/05/2022. TIA and Stroke; Risk Factors:Hypertension,                 Diabetes and Dyslipidemia.  Sonographer:    Harriette Bouillon RDCS Referring Phys: [phone] SUBRINA SUNDIL IMPRESSIONS  1. Left ventricular ejection fraction, by estimation, is 60 to 65%. The left ventricle has normal function. The left ventricle has no regional wall motion abnormalities. Left ventricular diastolic parameters are consistent with Grade II diastolic dysfunction (pseudonormalization). Elevated left atrial pressure.  2. Right ventricular systolic function is normal. The right ventricular size is normal. There is mildly elevated pulmonary artery systolic pressure. The estimated right ventricular systolic pressure is 39.0 mmHg.  3. The mitral valve is normal in structure. Trivial mitral valve regurgitation. No evidence of mitral stenosis.  4. The aortic valve is tricuspid. There is mild calcification of the aortic valve. Aortic valve regurgitation is trivial. Aortic valve sclerosis/calcification is present, without any evidence of aortic stenosis.  5. The inferior vena cava is normal in size with greater than 50% respiratory variability, suggesting right atrial pressure of 3 mmHg. Comparison(s): Prior images reviewed side by side. The left ventricular diastolic function is significantly worse. FINDINGS  Left Ventricle: Left ventricular ejection fraction, by estimation, is 60 to 65%. The left ventricle has normal function. The left  ventricle has no regional wall motion abnormalities. The left ventricular internal cavity size was normal in size. There is  no left ventricular hypertrophy. Left ventricular diastolic parameters are consistent with Grade II diastolic dysfunction (pseudonormalization). Elevated left atrial pressure. Right Ventricle: The right ventricular size is normal. No  increase in right ventricular wall thickness. Right ventricular systolic function is normal. There is mildly elevated pulmonary artery systolic pressure. The tricuspid regurgitant velocity is 3.00  m/s, and with an assumed right atrial pressure of 3 mmHg, the estimated right ventricular systolic pressure is 39.0 mmHg. Left Atrium: Left atrial size was normal in size. Right Atrium: Right atrial size was normal in size. Pericardium: There is no evidence of pericardial effusion. Mitral Valve: The mitral valve is normal in structure. Trivial mitral valve regurgitation. No evidence of mitral valve stenosis. Tricuspid Valve: The tricuspid valve is normal in structure. Tricuspid valve regurgitation is not demonstrated. No evidence of tricuspid stenosis. Aortic Valve: The aortic valve is tricuspid. There is mild calcification of the aortic valve. Aortic valve regurgitation is trivial. Aortic valve sclerosis/calcification is present, without any evidence of aortic stenosis. Pulmonic Valve: The pulmonic valve was normal in structure. Pulmonic valve regurgitation is not visualized. No evidence of pulmonic stenosis. Aorta: The aortic root and ascending aorta are structurally normal, with no evidence of dilitation. Venous: The inferior vena cava is normal in size with greater than 50% respiratory variability, suggesting right atrial pressure of 3 mmHg. IAS/Shunts: No atrial level shunt detected by color flow Doppler.  LEFT VENTRICLE PLAX 2D LVIDd:         4.20 cm   Diastology LVIDs:         2.40 cm   LV e' medial:    5.44 cm/s LV PW:         0.90 cm   LV E/e' medial:  21.3 LV IVS:        0.90 cm   LV e' lateral:   4.68 cm/s LVOT diam:     2.00 cm   LV E/e' lateral: 24.8 LV SV:         72 LV SV Index:   46 LVOT Area:     3.14 cm  RIGHT VENTRICLE             IVC RV S prime:     12.90 cm/s  IVC diam: 2.00 cm TAPSE (M-mode): 1.7 cm LEFT ATRIUM             Index        RIGHT ATRIUM           Index LA diam:        3.50 cm 2.22 cm/m    RA Area:     13.00 cm LA Vol (A2C):   27.8 ml 17.63 ml/m  RA Volume:   28.60 ml  18.14 ml/m LA Vol (A4C):   41.5 ml 26.32 ml/m LA Biplane Vol: 36.8 ml 23.34 ml/m  AORTIC VALVE LVOT Vmax:   87.70 cm/s LVOT Vmean:  56.200 cm/s LVOT VTI:    0.230 m  AORTA Ao Root diam: 2.10 cm Ao Asc diam:  3.00 cm MITRAL VALVE                TRICUSPID VALVE MV Area (PHT): 5.84 cm     TR Peak grad:   36.0 mmHg MV Decel Time: 130 msec     TR Vmax:        300.00 cm/s MV E velocity: 116.00 cm/s  MV A velocity: 80.20 cm/s   SHUNTS MV E/A ratio:  1.45         Systemic VTI:  0.23 m                             Systemic Diam: 2.00 cm Rachelle Hora Croitoru MD Electronically signed by Thurmon Fair MD Signature Date/Time: 12/29/2022/1:33:11 PM    Final    MR BRAIN WO CONTRAST  Result Date: 12/29/2022 CLINICAL DATA:  Initial evaluation for acute neuro deficit, stroke suspected. EXAM: MRI HEAD WITHOUT CONTRAST MRA HEAD WITHOUT CONTRAST MRA NECK WITHOUT CONTRAST TECHNIQUE: Multiplanar, multiecho pulse sequences of the brain and surrounding structures were obtained without intravenous contrast. Angiographic images of the Circle of Willis were obtained using MRA technique without intravenous contrast. Angiographic images of the neck were obtained using MRA technique without intravenous contrast. Carotid stenosis measurements (when applicable) are obtained utilizing NASCET criteria, using the distal internal carotid diameter as the denominator. COMPARISON:  CT from earlier the same day. FINDINGS: MRI HEAD FINDINGS Brain: Cerebral volume within normal limits. Patchy and confluent T2/FLAIR hyperintensity involving the periventricular and deep white matter both cerebral hemispheres, most like related chronic microvascular ischemic disease, moderately advanced in nature. Encephalomalacia and gliosis with associated chronic hemosiderin staining noted at the right temporoccipital junction. Few additional scattered foci of chronic hemosiderin staining  noted about the left occipital and right frontal lobes, suggesting prior hemorrhage at these locations. Few scattered superimposed chronic micro hemorrhages, most likely hypertensive in nature. No abnormal foci of restricted diffusion to suggest acute or subacute ischemia. Gray-white matter differentiation maintained. No acute intracranial hemorrhage. No mass lesion, mass effect or midline shift. Mild ex vacuo dilatation of the right lateral ventricle without hydrocephalus. No extra-axial fluid collection. Pituitary gland and suprasellar region within normal limits. Vascular: Major intracranial vascular flow voids are maintained. Skull and upper cervical spine: Craniocervical junction with level limits. Bone marrow signal intensity normal. No scalp soft tissue abnormality. Sinuses/Orbits: Prior bilateral ocular lens replacement. Mild mucosal thickening noted about the paranasal sinuses. No air-fluid levels. Trace left mastoid effusion, of doubtful significance. Other: None. MRA HEAD FINDINGS ANTERIOR CIRCULATION: Atheromatous change about the left carotid siphon with associated short-segment moderate to severe stenosis at the proximal cavernous segment (series 1, image 84). No stenosis about the contralateral right siphon. A1 segments patent bilaterally. Normal anterior communicating artery complex. Anterior cerebral arteries patent without stenosis. No M1 stenosis or occlusion. Distal MCA branches perfused and symmetric. POSTERIOR CIRCULATION: Both V4 segments patent without stenosis. Both PICA patent. Basilar patent without stenosis. Superior cerebellar and posterior cerebral arteries patent bilaterally without significant stenosis. Anatomic variants: None significant.  No intracranial aneurysm. MRA NECK FINDINGS AORTIC ARCH: Partially visualized aortic arch within normal limits for caliber with standard branch pattern. No visible stenosis about the origin of the great vessels. RIGHT CAROTID SYSTEM: Right common  and internal carotid arteries are patent with antegrade flow. No evidence for dissection. Mild atheromatous irregularity about the right carotid bulb without hemodynamically significant greater than 50% stenosis. LEFT CAROTID SYSTEM: Left common and internal carotid arteries are patent with antegrade flow. No evidence for dissection. Atheromatous irregularity about the left carotid bulb without hemodynamically significant greater than 50% stenosis. VERTEBRAL ARTERIES: Both vertebral arteries arise from the subclavian arteries. Proximal left vertebral artery tortuous and intermittently not seen, which could be related to severe stenoses and/or possible proximal occlusion. Left vertebral artery patent with antegrade flow extending from the  distal left V1 segment to the skull base. Right vertebral artery patent without stenosis. No visible evidence for dissection. Other: None. IMPRESSION: MRI HEAD: 1. No acute intracranial abnormality. 2. 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 Few additional scattered foci of chronic hemosiderin staining about the left occipital and right frontal lobes, suggesting prior hemorrhage at these locations. 3. Underlying moderate chronic microvascular ischemic disease. MRA HEAD: 1. Negative intracranial MRA for large vessel occlusion. 2. Atheromatous change about the left carotid siphon with associated short-segment moderate to severe stenosis at the proximal cavernous segment. 3. Otherwise wide patency of the major intracranial arterial vasculature. MRA NECK: 1. Atheromatous change about the carotid bifurcations without hemodynamically significant greater than 50% stenosis. 2. Proximal left vertebral artery tortuous and intermittently not seen, which could be related to underlying stenoses and/or possible proximal occlusion. Left vertebral artery otherwise patent extending from the distal left V1 segment to the skull base. 3.  Wide patency of the right vertebral artery without stenosis. Electronically Signed   By: Rise Mu M.D.   On: 12/29/2022 06:01   MR ANGIO HEAD WO CONTRAST  Result Date: 12/29/2022 CLINICAL DATA:  Initial evaluation for acute neuro deficit, stroke suspected. EXAM: MRI HEAD WITHOUT CONTRAST MRA HEAD WITHOUT CONTRAST MRA NECK WITHOUT CONTRAST TECHNIQUE: Multiplanar, multiecho pulse sequences of the brain and surrounding structures were obtained without intravenous contrast. Angiographic images of the Circle of Willis were obtained using MRA technique without intravenous contrast. Angiographic images of the neck were obtained using MRA technique without intravenous contrast. Carotid stenosis measurements (when applicable) are obtained utilizing NASCET criteria, using the distal internal carotid diameter as the denominator. COMPARISON:  CT from earlier the same day. FINDINGS: MRI HEAD FINDINGS Brain: Cerebral volume within normal limits. Patchy and confluent T2/FLAIR hyperintensity involving the periventricular and deep white matter both cerebral hemispheres, most like related chronic microvascular ischemic disease, moderately advanced in nature. Encephalomalacia and gliosis with associated chronic hemosiderin staining noted at the right temporoccipital junction. Few additional scattered foci of chronic hemosiderin staining noted about the left occipital and right frontal lobes, suggesting prior hemorrhage at these locations. Few scattered superimposed chronic micro hemorrhages, most likely hypertensive in nature. No abnormal foci of restricted diffusion to suggest acute or subacute ischemia. Gray-white matter differentiation maintained. No acute intracranial hemorrhage. No mass lesion, mass effect or midline shift. Mild ex vacuo dilatation of the right lateral ventricle without hydrocephalus. No extra-axial fluid collection. Pituitary gland and suprasellar region within normal limits. Vascular: Major  intracranial vascular flow voids are maintained. Skull and upper cervical spine: Craniocervical junction with level limits. Bone marrow signal intensity normal. No scalp soft tissue abnormality. Sinuses/Orbits: Prior bilateral ocular lens replacement. Mild mucosal thickening noted about the paranasal sinuses. No air-fluid levels. Trace left mastoid effusion, of doubtful significance. Other: None. MRA HEAD FINDINGS ANTERIOR CIRCULATION: Atheromatous change about the left carotid siphon with associated short-segment moderate to severe stenosis at the proximal cavernous segment (series 1, image 84). No stenosis about the contralateral right siphon. A1 segments patent bilaterally. Normal anterior communicating artery complex. Anterior cerebral arteries patent without stenosis. No M1 stenosis or occlusion. Distal MCA branches perfused and symmetric. POSTERIOR CIRCULATION: Both V4 segments patent without stenosis. Both PICA patent. Basilar patent without stenosis. Superior cerebellar and posterior cerebral arteries patent bilaterally without significant stenosis. Anatomic variants: None significant.  No intracranial aneurysm. MRA NECK FINDINGS AORTIC ARCH: Partially visualized aortic arch within normal limits for caliber with standard branch pattern. No visible  stenosis about the origin of the great vessels. RIGHT CAROTID SYSTEM: Right common and internal carotid arteries are patent with antegrade flow. No evidence for dissection. Mild atheromatous irregularity about the right carotid bulb without hemodynamically significant greater than 50% stenosis. LEFT CAROTID SYSTEM: Left common and internal carotid arteries are patent with antegrade flow. No evidence for dissection. Atheromatous irregularity about the left carotid bulb without hemodynamically significant greater than 50% stenosis. VERTEBRAL ARTERIES: Both vertebral arteries arise from the subclavian arteries. Proximal left vertebral artery tortuous and  intermittently not seen, which could be related to severe stenoses and/or possible proximal occlusion. Left vertebral artery patent with antegrade flow extending from the distal left V1 segment to the skull base. Right vertebral artery patent without stenosis. No visible evidence for dissection. Other: None. IMPRESSION: MRI HEAD: 1. No acute intracranial abnormality. 2. 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 Few additional scattered foci of chronic hemosiderin staining about the left occipital and right frontal lobes, suggesting prior hemorrhage at these locations. 3. Underlying moderate chronic microvascular ischemic disease. MRA HEAD: 1. Negative intracranial MRA for large vessel occlusion. 2. Atheromatous change about the left carotid siphon with associated short-segment moderate to severe stenosis at the proximal cavernous segment. 3. Otherwise wide patency of the major intracranial arterial vasculature. MRA NECK: 1. Atheromatous change about the carotid bifurcations without hemodynamically significant greater than 50% stenosis. 2. Proximal left vertebral artery tortuous and intermittently not seen, which could be related to underlying stenoses and/or possible proximal occlusion. Left vertebral artery otherwise patent extending from the distal left V1 segment to the skull base. 3. Wide patency of the right vertebral artery without stenosis. Electronically Signed   By: Rise Mu M.D.   On: 12/29/2022 06:01   MR ANGIO NECK WO CONTRAST  Result Date: 12/29/2022 CLINICAL DATA:  Initial evaluation for acute neuro deficit, stroke suspected. EXAM: MRI HEAD WITHOUT CONTRAST MRA HEAD WITHOUT CONTRAST MRA NECK WITHOUT CONTRAST TECHNIQUE: Multiplanar, multiecho pulse sequences of the brain and surrounding structures were obtained without intravenous contrast. Angiographic images of the Circle of Willis were obtained using MRA technique without  intravenous contrast. Angiographic images of the neck were obtained using MRA technique without intravenous contrast. Carotid stenosis measurements (when applicable) are obtained utilizing NASCET criteria, using the distal internal carotid diameter as the denominator. COMPARISON:  CT from earlier the same day. FINDINGS: MRI HEAD FINDINGS Brain: Cerebral volume within normal limits. Patchy and confluent T2/FLAIR hyperintensity involving the periventricular and deep white matter both cerebral hemispheres, most like related chronic microvascular ischemic disease, moderately advanced in nature. Encephalomalacia and gliosis with associated chronic hemosiderin staining noted at the right temporoccipital junction. Few additional scattered foci of chronic hemosiderin staining noted about the left occipital and right frontal lobes, suggesting prior hemorrhage at these locations. Few scattered superimposed chronic micro hemorrhages, most likely hypertensive in nature. No abnormal foci of restricted diffusion to suggest acute or subacute ischemia. Gray-white matter differentiation maintained. No acute intracranial hemorrhage. No mass lesion, mass effect or midline shift. Mild ex vacuo dilatation of the right lateral ventricle without hydrocephalus. No extra-axial fluid collection. Pituitary gland and suprasellar region within normal limits. Vascular: Major intracranial vascular flow voids are maintained. Skull and upper cervical spine: Craniocervical junction with level limits. Bone marrow signal intensity normal. No scalp soft tissue abnormality. Sinuses/Orbits: Prior bilateral ocular lens replacement. Mild mucosal thickening noted about the paranasal sinuses. No air-fluid levels. Trace left mastoid effusion, of doubtful significance. Other: None. MRA HEAD  FINDINGS ANTERIOR CIRCULATION: Atheromatous change about the left carotid siphon with associated short-segment moderate to severe stenosis at the proximal cavernous segment  (series 1, image 84). No stenosis about the contralateral right siphon. A1 segments patent bilaterally. Normal anterior communicating artery complex. Anterior cerebral arteries patent without stenosis. No M1 stenosis or occlusion. Distal MCA branches perfused and symmetric. POSTERIOR CIRCULATION: Both V4 segments patent without stenosis. Both PICA patent. Basilar patent without stenosis. Superior cerebellar and posterior cerebral arteries patent bilaterally without significant stenosis. Anatomic variants: None significant.  No intracranial aneurysm. MRA NECK FINDINGS AORTIC ARCH: Partially visualized aortic arch within normal limits for caliber with standard branch pattern. No visible stenosis about the origin of the great vessels. RIGHT CAROTID SYSTEM: Right common and internal carotid arteries are patent with antegrade flow. No evidence for dissection. Mild atheromatous irregularity about the right carotid bulb without hemodynamically significant greater than 50% stenosis. LEFT CAROTID SYSTEM: Left common and internal carotid arteries are patent with antegrade flow. No evidence for dissection. Atheromatous irregularity about the left carotid bulb without hemodynamically significant greater than 50% stenosis. VERTEBRAL ARTERIES: Both vertebral arteries arise from the subclavian arteries. Proximal left vertebral artery tortuous and intermittently not seen, which could be related to severe stenoses and/or possible proximal occlusion. Left vertebral artery patent with antegrade flow extending from the distal left V1 segment to the skull base. Right vertebral artery patent without stenosis. No visible evidence for dissection. Other: None. IMPRESSION: MRI HEAD: 1. No acute intracranial abnormality. 2. 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 Few additional scattered foci of chronic hemosiderin staining about the left occipital and right frontal lobes,  suggesting prior hemorrhage at these locations. 3. Underlying moderate chronic microvascular ischemic disease. MRA HEAD: 1. Negative intracranial MRA for large vessel occlusion. 2. Atheromatous change about the left carotid siphon with associated short-segment moderate to severe stenosis at the proximal cavernous segment. 3. Otherwise wide patency of the major intracranial arterial vasculature. MRA NECK: 1. Atheromatous change about the carotid bifurcations without hemodynamically significant greater than 50% stenosis. 2. Proximal left vertebral artery tortuous and intermittently not seen, which could be related to underlying stenoses and/or possible proximal occlusion. Left vertebral artery otherwise patent extending from the distal left V1 segment to the skull base. 3. Wide patency of the right vertebral artery without stenosis. Electronically Signed   By: Rise Mu M.D.   On: 12/29/2022 06:01   CT HEAD CODE STROKE WO CONTRAST  Result Date: 12/28/2022 CLINICAL DATA:  Code stroke. Initial evaluation for acute neuro deficit, stroke. EXAM: CT HEAD WITHOUT CONTRAST TECHNIQUE: Contiguous axial images were obtained from the base of the skull through the vertex without intravenous contrast. RADIATION DOSE REDUCTION: This exam was performed according to the departmental dose-optimization program which includes automated exposure control, adjustment of the mA and/or kV according to patient size and/or use of iterative reconstruction technique. COMPARISON:  Prior study from 01/11/2022. FINDINGS: Brain: Cerebral volume within normal limits. Moderate chronic microvascular ischemic disease. Chronic encephalomalacia at the right occipital lobe, stable. No acute intracranial hemorrhage. No visible acute large vessel territory infarct. No mass lesion or midline shift. No hydrocephalus or extra-axial fluid collection. Vascular: No abnormal hyperdense vessel. Scattered vascular calcifications noted within the carotid  siphons. Skull: Scalp soft tissues within normal limits.  Calvarium intact. Sinuses/Orbits: Globes and orbital soft tissues within normal limits. Paranasal sinuses are largely clear. No significant mastoid effusion. Other: None. ASPECTS [facility] Stroke Program Early CT Score) - Ganglionic level  infarction (caudate, lentiform nuclei, internal capsule, insula, M1-M3 cortex): 7 - Supraganglionic infarction (M4-M6 cortex): 3 Total score (0-10 with 10 being normal): 10 IMPRESSION: 1. No acute intracranial abnormality. 2. ASPECTS is 10. 3. Moderate chronic microvascular ischemic disease with chronic encephalomalacia at the right occipital lobe, stable. These results were communicated to Dr. Otelia Limes at 8:12 pm on 12/28/2022 by text page via the [facility] messaging system. Electronically Signed   By: Rise Mu M.D.   On: 12/28/2022 20:14   DG CHEST PORT 1 VIEW  Result Date: 12/27/2022 CLINICAL DATA:  Respiratory distress. EXAM: PORTABLE CHEST 1 VIEW COMPARISON:  12/23/2022 FINDINGS: Low volume film. Cardiopericardial silhouette is at upper limits of normal for size. Diffuse interstitial lung disease again noted with bibasilar atelectasis or infiltrate. No substantial pleural effusion. No acute bony abnormality. IMPRESSION: Low volume film with diffuse interstitial lung disease and bibasilar atelectasis or infiltrate. Electronically Signed   By: Kennith Center M.D.   On: 12/27/2022 08:53   DG Chest Port 1 View  Result Date: 12/23/2022 CLINICAL DATA:  Shortness of breath EXAM: PORTABLE CHEST 1 VIEW COMPARISON:  07/16/2022 FINDINGS: Slightly shallow inspiration. Mild cardiac enlargement. No vascular congestion. Peribronchial thickening with coarse interstitial changes in the perihilar region likely representing chronic bronchitis. No airspace disease or consolidation. No pleural effusions. No pneumothorax. Mediastinal contours appear intact. Degenerative changes in the spine. Postoperative changes in the cervical  spine. IMPRESSION: Chronic bronchitic changes in the lungs. Mild cardiac enlargement. No focal consolidation. Electronically Signed   By: Burman Nieves M.D.   On: 12/23/2022 01:04   US RENAL  Result Date: 12/20/2022 CLINICAL DATA:  Urinary retention.  Acute kidney injury. EXAM: RENAL / URINARY TRACT ULTRASOUND COMPLETE COMPARISON:  CT 12/13/2022 FINDINGS: Right Kidney: Renal measurements: 9.8 x 5.7 x 5.1 cm = volume: 149 mL. Echogenicity within normal limits. No mass or hydronephrosis visualized. Left Kidney: Renal measurements: 10.4 x 5.1 x 5.1 cm = volume: 140 mL. Echogenicity within normal limits. No mass or hydronephrosis visualized. Bladder: Appears normal for degree of bladder distention. Other: None. IMPRESSION: Normal ultrasound appearance of the kidneys. No mass or hydronephrosis identified. Electronically Signed   By: Burman Nieves M.D.   On: 12/20/2022 02:36   CT ABDOMEN PELVIS WO CONTRAST  Result Date: 12/13/2022 CLINICAL DATA:  Abdominal pain, acute, nonlocalized. Lower abdominal pain. Diarrhea for 3 days. EXAM: CT ABDOMEN AND PELVIS WITHOUT CONTRAST TECHNIQUE: Multidetector CT imaging of the abdomen and pelvis was performed following the standard protocol without IV contrast. RADIATION DOSE REDUCTION: This exam was performed according to the departmental dose-optimization program which includes automated exposure control, adjustment of the mA and/or kV according to patient size and/or use of iterative reconstruction technique. COMPARISON:  None Available. FINDINGS: Lower chest: There are patchy areas of scarring/atelectasis in the visualized lung bases. No overt consolidation. No pleural effusion. The heart is normal in size. No pericardial effusion. Hepatobiliary: The liver is normal in size. Non-cirrhotic configuration. No suspicious mass. There is a subcentimeter sized hypoattenuating focus in the left hepatic lobe, segment 4A, which is too small to adequately characterize. No  intrahepatic or extrahepatic bile duct dilation. No calcified gallstones. Focal fundal adenomyomatosis noted. Otherwise normal gallbladder wall thickness. No pericholecystic inflammatory changes. Pancreas: Unremarkable. No pancreatic ductal dilatation or surrounding inflammatory changes. Spleen: Within normal limits. No focal lesion. Adrenals/Urinary Tract: Adrenal glands are unremarkable. No suspicious renal mass. No hydronephrosis. No renal or ureteric calculi. Unremarkable urinary bladder. Stomach/Bowel: No disproportionate dilation of the small or  large bowel loops. The appendix was not visualized; however there is no acute inflammatory process in the right lower quadrant. There is mild-to-moderate circumferential thickening of the descending colon and proximal sigmoid colon with mild-to-moderate surrounding pericolonic fat stranding and prominence of vasa recta, compatible with colitis. There is no pneumatosis, portal venous gas, pneumoperitoneum or walled-off abscess/loculated collection. Vascular/Lymphatic: No ascites or pneumoperitoneum. No abdominal or pelvic lymphadenopathy, by size criteria. No aneurysmal dilation of the major abdominal arteries. There are moderate peripheral atherosclerotic vascular calcifications of the aorta and its major branches. Reproductive: The uterus is surgically absent. No large adnexal mass. Other: The visualized soft tissues and abdominal wall are unremarkable. Musculoskeletal: No suspicious osseous lesions. There are moderate multilevel degenerative changes in the visualized spine. IMPRESSION: *Findings compatible with acute uncomplicated colitis involving the descending colon and proximal sigmoid colon. Differential diagnosis includes infectious, inflammatory or ischemic etiology, likely in that order. *Multiple other nonacute observations, as described above. Electronically Signed   By: Jules Schick M.D.   On: 12/13/2022 15:47    Time coordinating discharge: 45  mins  SIGNED:  Carollee Herter, DO [facility] 12/29/22, 2:53 PM

## 2022-12-29 NOTE — Plan of Care (Signed)
  Problem: Education: Goal: Knowledge of General Education information will improve Description: Including pain rating scale, medication(s)/side effects and non-pharmacologic comfort measures Outcome: Progressing   Problem: Health Behavior/Discharge Planning: Goal: Ability to manage health-related needs will improve Outcome: Progressing   Problem: Clinical Measurements: Goal: Ability to maintain clinical measurements within normal limits will improve Outcome: Progressing Goal: Will remain free from infection Outcome: Progressing Goal: Diagnostic test results will improve Outcome: Progressing Goal: Respiratory complications will improve Outcome: Progressing Goal: Cardiovascular complication will be avoided Outcome: Progressing   Problem: Activity: Goal: Risk for activity intolerance will decrease Outcome: Progressing   Problem: Nutrition: Goal: Adequate nutrition will be maintained Outcome: Progressing   Problem: Coping: Goal: Level of anxiety will decrease Outcome: Progressing   Problem: Elimination: Goal: Will not experience complications related to bowel motility Outcome: Progressing Goal: Will not experience complications related to urinary retention Outcome: Progressing   Problem: Pain Management: Goal: General experience of comfort will improve Outcome: Progressing   Problem: Safety: Goal: Ability to remain free from injury will improve Outcome: Progressing   Problem: Skin Integrity: Goal: Risk for impaired skin integrity will decrease Outcome: Progressing   Problem: Education: Goal: Ability to describe self-care measures that may prevent or decrease complications (Diabetes Survival Skills Education) will improve Outcome: Progressing Goal: Individualized Educational Video(s) Outcome: Progressing   Problem: Coping: Goal: Ability to adjust to condition or change in health will improve Outcome: Progressing   Problem: Fluid Volume: Goal: Ability to  maintain a balanced intake and output will improve Outcome: Progressing   Problem: Health Behavior/Discharge Planning: Goal: Ability to identify and utilize available resources and services will improve Outcome: Progressing Goal: Ability to manage health-related needs will improve Outcome: Progressing   Problem: Metabolic: Goal: Ability to maintain appropriate glucose levels will improve Outcome: Progressing   Problem: Nutritional: Goal: Maintenance of adequate nutrition will improve Outcome: Progressing Goal: Progress toward achieving an optimal weight will improve Outcome: Progressing   Problem: Skin Integrity: Goal: Risk for impaired skin integrity will decrease Outcome: Progressing   Problem: Tissue Perfusion: Goal: Adequacy of tissue perfusion will improve Outcome: Progressing   Problem: Education: Goal: Knowledge of disease or condition will improve Outcome: Progressing Goal: Knowledge of secondary prevention will improve (MUST DOCUMENT ALL) Outcome: Progressing Goal: Knowledge of patient specific risk factors will improve Loraine Leriche N/A or DELETE if not current risk factor) Outcome: Progressing   Problem: Ischemic Stroke/TIA Tissue Perfusion: Goal: Complications of ischemic stroke/TIA will be minimized Outcome: Progressing   Problem: Coping: Goal: Will verbalize positive feelings about self Outcome: Progressing Goal: Will identify appropriate support needs Outcome: Progressing   Problem: Health Behavior/Discharge Planning: Goal: Ability to manage health-related needs will improve Outcome: Progressing Goal: Goals will be collaboratively established with patient/family Outcome: Progressing   Problem: Self-Care: Goal: Ability to participate in self-care as condition permits will improve Outcome: Progressing Goal: Verbalization of feelings and concerns over difficulty with self-care will improve Outcome: Progressing Goal: Ability to communicate needs accurately  will improve Outcome: Progressing   Problem: Nutrition: Goal: Risk of aspiration will decrease Outcome: Progressing Goal: Dietary intake will improve Outcome: Progressing

## 2022-12-29 NOTE — Assessment & Plan Note (Signed)
12-29-2022 code stroke called last night. Pt seen by neurology. MRI brain negative for acute CVA. 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 Echo showed normal LEVF of 60%. No intra-cardiac thrombus. No shunt. Neurology felt that pt did not have acute CVA or TIA. Her event yesterday likely due to panic attack.

## 2022-12-29 NOTE — Progress Notes (Signed)
  Echocardiogram 2D Echocardiogram has been performed.  Leda Roys RDCS 12/29/2022, 9:46 AM

## 2022-12-29 NOTE — Progress Notes (Signed)
STROKE TEAM PROGRESS NOTE   SUBJECTIVE (INTERVAL HISTORY) Her daughter is at the bedside.  Overall her condition is completely resolved.  Patient stated that last night she had episode of left arm numbness and left face drawing feeling too.  Code stroke activated, currently symptom resolved.  MRI no acute infarct.  Per daughter, patient probably anxious when daughter gone home last night.   OBJECTIVE Temp:  [97.6 F (36.4 C)-98.5 F (36.9 C)] 98.4 F (36.9 C) (12/10 1127) Pulse Rate:  [58-72] 65 (12/10 1127) Cardiac Rhythm: Normal sinus rhythm (12/10 0700) Resp:  [15-19] 17 (12/10 1127) BP: (143-158)/(43-64) 153/55 (12/10 1127) SpO2:  [90 %-100 %] 100 % (12/10 1127) FiO2 (%):  [1 %-2 %] 1 % (12/09 2342)  Recent Labs  Lab 12/28/22 1333 12/28/22 1631 12/28/22 1940 12/29/22 0638 12/29/22 1129  GLUCAP 291* 147* 112* 135* 316*   Recent Labs  Lab 12/25/22 0535 12/26/22 0536 12/27/22 0725 12/28/22 0704 12/29/22 0452  NA 136 141 141 140 141  K 3.4* 4.6 3.9 4.0 3.8  CL 107 109 107 106 104  CO2 22 22 26 25 27   GLUCOSE 110* 129* 159* 153* 132*  BUN 25* 29* 29* 29* 28*  CREATININE 3.13* 3.18* 3.40* 3.42* 3.14*  CALCIUM 7.3* 7.7* 8.2* 7.9* 7.9*  MG  --   --   --  1.3*  --    Recent Labs  Lab 12/28/22 0704 12/29/22 0452  AST 18 18  ALT 14 13  ALKPHOS 52 51  BILITOT 0.5 0.5  PROT 5.5* 5.5*  ALBUMIN 2.7* 2.6*   Recent Labs  Lab 12/23/22 0654 12/24/22 0615 12/25/22 0535 12/25/22 0901 12/26/22 0536 12/27/22 0725  WBC 7.1 8.0 7.3  --  7.6 7.7  NEUTROABS 4.6  --   --   --   --   --   HGB 8.3* 7.7* 6.9* 7.9* 7.5* 7.8*  HCT 25.0* 23.9* 20.8* 24.6* 22.6* 24.1*  MCV 97.7 96.4 96.7  --  97.4 98.4  PLT 146* 224 205  --  208 242   No results for input(s): "CKTOTAL", "CKMB", "CKMBINDEX", "TROPONINI" in the last 168 hours. No results for input(s): "LABPROT", "INR" in the last 72 hours. No results for input(s): "COLORURINE", "LABSPEC", "PHURINE", "GLUCOSEU", "HGBUR",  "BILIRUBINUR", "KETONESUR", "PROTEINUR", "UROBILINOGEN", "NITRITE", "LEUKOCYTESUR" in the last 72 hours.  Invalid input(s): "APPERANCEUR"     Component Value Date/Time   CHOL 95 12/29/2022 0452   TRIG 116 12/29/2022 0452   HDL 35 (L) 12/29/2022 0452   CHOLHDL 2.7 12/29/2022 0452   VLDL 23 12/29/2022 0452   LDLCALC 37 12/29/2022 0452   LDLCALC 80 06/08/2022 0808   Lab Results  Component Value Date   HGBA1C 8.2 (H) 11/30/2022      Component Value Date/Time   LABOPIA POSITIVE (A) 03/17/2021 2219   COCAINSCRNUR NONE DETECTED 03/17/2021 2219   LABBENZ NONE DETECTED 03/17/2021 2219   AMPHETMU NONE DETECTED 03/17/2021 2219   THCU NONE DETECTED 03/17/2021 2219   LABBARB NONE DETECTED 03/17/2021 2219    No results for input(s): "ETH" in the last 168 hours.  I have personally reviewed the radiological images below and agree with the radiology interpretations.  ECHOCARDIOGRAM COMPLETE  Result Date: 12/29/2022    ECHOCARDIOGRAM REPORT   Patient Name:   Sandra [name] Date of Exam: 12/29/2022 Medical Rec #:  [id number]     Height:       60.0 in Accession #:    [id number]    Weight:  134.5 lb Date of Birth:  10-16-1933    BSA:          1.577 m Patient Age:    89 years      BP:           158/61 mmHg Patient Gender: F             HR:           64 bpm. Exam Location:  Inpatient Procedure: 2D Echo, Cardiac Doppler and Color Doppler Indications:    TIA G45.9  History:        Patient has prior history of Echocardiogram examinations, most                 recent 01/05/2022. TIA and Stroke; Risk Factors:Hypertension,                 Diabetes and Dyslipidemia.  Sonographer:    Harriette Bouillon RDCS Referring Phys: [phone] SUBRINA SUNDIL IMPRESSIONS  1. Left ventricular ejection fraction, by estimation, is 60 to 65%. The left ventricle has normal function. The left ventricle has no regional wall motion abnormalities. Left ventricular diastolic parameters are consistent with Grade II diastolic dysfunction  (pseudonormalization). Elevated left atrial pressure.  2. Right ventricular systolic function is normal. The right ventricular size is normal. There is mildly elevated pulmonary artery systolic pressure. The estimated right ventricular systolic pressure is 39.0 mmHg.  3. The mitral valve is normal in structure. Trivial mitral valve regurgitation. No evidence of mitral stenosis.  4. The aortic valve is tricuspid. There is mild calcification of the aortic valve. Aortic valve regurgitation is trivial. Aortic valve sclerosis/calcification is present, without any evidence of aortic stenosis.  5. The inferior vena cava is normal in size with greater than 50% respiratory variability, suggesting right atrial pressure of 3 mmHg. Comparison(s): Prior images reviewed side by side. The left ventricular diastolic function is significantly worse. FINDINGS  Left Ventricle: Left ventricular ejection fraction, by estimation, is 60 to 65%. The left ventricle has normal function. The left ventricle has no regional wall motion abnormalities. The left ventricular internal cavity size was normal in size. There is  no left ventricular hypertrophy. Left ventricular diastolic parameters are consistent with Grade II diastolic dysfunction (pseudonormalization). Elevated left atrial pressure. Right Ventricle: The right ventricular size is normal. No increase in right ventricular wall thickness. Right ventricular systolic function is normal. There is mildly elevated pulmonary artery systolic pressure. The tricuspid regurgitant velocity is 3.00  m/s, and with an assumed right atrial pressure of 3 mmHg, the estimated right ventricular systolic pressure is 39.0 mmHg. Left Atrium: Left atrial size was normal in size. Right Atrium: Right atrial size was normal in size. Pericardium: There is no evidence of pericardial effusion. Mitral Valve: The mitral valve is normal in structure. Trivial mitral valve regurgitation. No evidence of mitral valve  stenosis. Tricuspid Valve: The tricuspid valve is normal in structure. Tricuspid valve regurgitation is not demonstrated. No evidence of tricuspid stenosis. Aortic Valve: The aortic valve is tricuspid. There is mild calcification of the aortic valve. Aortic valve regurgitation is trivial. Aortic valve sclerosis/calcification is present, without any evidence of aortic stenosis. Pulmonic Valve: The pulmonic valve was normal in structure. Pulmonic valve regurgitation is not visualized. No evidence of pulmonic stenosis. Aorta: The aortic root and ascending aorta are structurally normal, with no evidence of dilitation. Venous: The inferior vena cava is normal in size with greater than 50% respiratory variability, suggesting right atrial pressure of 3 mmHg.  IAS/Shunts: No atrial level shunt detected by color flow Doppler.  LEFT VENTRICLE PLAX 2D LVIDd:         4.20 cm   Diastology LVIDs:         2.40 cm   LV e' medial:    5.44 cm/s LV PW:         0.90 cm   LV E/e' medial:  21.3 LV IVS:        0.90 cm   LV e' lateral:   4.68 cm/s LVOT diam:     2.00 cm   LV E/e' lateral: 24.8 LV SV:         72 LV SV Index:   46 LVOT Area:     3.14 cm  RIGHT VENTRICLE             IVC RV S prime:     12.90 cm/s  IVC diam: 2.00 cm TAPSE (M-mode): 1.7 cm LEFT ATRIUM             Index        RIGHT ATRIUM           Index LA diam:        3.50 cm 2.22 cm/m   RA Area:     13.00 cm LA Vol (A2C):   27.8 ml 17.63 ml/m  RA Volume:   28.60 ml  18.14 ml/m LA Vol (A4C):   41.5 ml 26.32 ml/m LA Biplane Vol: 36.8 ml 23.34 ml/m  AORTIC VALVE LVOT Vmax:   87.70 cm/s LVOT Vmean:  56.200 cm/s LVOT VTI:    0.230 m  AORTA Ao Root diam: 2.10 cm Ao Asc diam:  3.00 cm MITRAL VALVE                TRICUSPID VALVE MV Area (PHT): 5.84 cm     TR Peak grad:   36.0 mmHg MV Decel Time: 130 msec     TR Vmax:        300.00 cm/s MV E velocity: 116.00 cm/s MV A velocity: 80.20 cm/s   SHUNTS MV E/A ratio:  1.45         Systemic VTI:  0.23 m                              Systemic Diam: 2.00 cm Rachelle Hora Croitoru MD Electronically signed by Thurmon Fair MD Signature Date/Time: 12/29/2022/1:33:11 PM    Final    MR BRAIN WO CONTRAST  Result Date: 12/29/2022 CLINICAL DATA:  Initial evaluation for acute neuro deficit, stroke suspected. EXAM: MRI HEAD WITHOUT CONTRAST MRA HEAD WITHOUT CONTRAST MRA NECK WITHOUT CONTRAST TECHNIQUE: Multiplanar, multiecho pulse sequences of the brain and surrounding structures were obtained without intravenous contrast. Angiographic images of the Circle of Willis were obtained using MRA technique without intravenous contrast. Angiographic images of the neck were obtained using MRA technique without intravenous contrast. Carotid stenosis measurements (when applicable) are obtained utilizing NASCET criteria, using the distal internal carotid diameter as the denominator. COMPARISON:  CT from earlier the same day. FINDINGS: MRI HEAD FINDINGS Brain: Cerebral volume within normal limits. Patchy and confluent T2/FLAIR hyperintensity involving the periventricular and deep white matter both cerebral hemispheres, most like related chronic microvascular ischemic disease, moderately advanced in nature. Encephalomalacia and gliosis with associated chronic hemosiderin staining noted at the right temporoccipital junction. Few additional scattered foci of chronic hemosiderin staining noted about the left occipital and right frontal lobes, suggesting prior  hemorrhage at these locations. Few scattered superimposed chronic micro hemorrhages, most likely hypertensive in nature. No abnormal foci of restricted diffusion to suggest acute or subacute ischemia. Gray-white matter differentiation maintained. No acute intracranial hemorrhage. No mass lesion, mass effect or midline shift. Mild ex vacuo dilatation of the right lateral ventricle without hydrocephalus. No extra-axial fluid collection. Pituitary gland and suprasellar region within normal limits. Vascular: Major  intracranial vascular flow voids are maintained. Skull and upper cervical spine: Craniocervical junction with level limits. Bone marrow signal intensity normal. No scalp soft tissue abnormality. Sinuses/Orbits: Prior bilateral ocular lens replacement. Mild mucosal thickening noted about the paranasal sinuses. No air-fluid levels. Trace left mastoid effusion, of doubtful significance. Other: None. MRA HEAD FINDINGS ANTERIOR CIRCULATION: Atheromatous change about the left carotid siphon with associated short-segment moderate to severe stenosis at the proximal cavernous segment (series 1, image 84). No stenosis about the contralateral right siphon. A1 segments patent bilaterally. Normal anterior communicating artery complex. Anterior cerebral arteries patent without stenosis. No M1 stenosis or occlusion. Distal MCA branches perfused and symmetric. POSTERIOR CIRCULATION: Both V4 segments patent without stenosis. Both PICA patent. Basilar patent without stenosis. Superior cerebellar and posterior cerebral arteries patent bilaterally without significant stenosis. Anatomic variants: None significant.  No intracranial aneurysm. MRA NECK FINDINGS AORTIC ARCH: Partially visualized aortic arch within normal limits for caliber with standard branch pattern. No visible stenosis about the origin of the great vessels. RIGHT CAROTID SYSTEM: Right common and internal carotid arteries are patent with antegrade flow. No evidence for dissection. Mild atheromatous irregularity about the right carotid bulb without hemodynamically significant greater than 50% stenosis. LEFT CAROTID SYSTEM: Left common and internal carotid arteries are patent with antegrade flow. No evidence for dissection. Atheromatous irregularity about the left carotid bulb without hemodynamically significant greater than 50% stenosis. VERTEBRAL ARTERIES: Both vertebral arteries arise from the subclavian arteries. Proximal left vertebral artery tortuous and  intermittently not seen, which could be related to severe stenoses and/or possible proximal occlusion. Left vertebral artery patent with antegrade flow extending from the distal left V1 segment to the skull base. Right vertebral artery patent without stenosis. No visible evidence for dissection. Other: None. IMPRESSION: MRI HEAD: 1. No acute intracranial abnormality. 2. 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 Few additional scattered foci of chronic hemosiderin staining about the left occipital and right frontal lobes, suggesting prior hemorrhage at these locations. 3. Underlying moderate chronic microvascular ischemic disease. MRA HEAD: 1. Negative intracranial MRA for large vessel occlusion. 2. Atheromatous change about the left carotid siphon with associated short-segment moderate to severe stenosis at the proximal cavernous segment. 3. Otherwise wide patency of the major intracranial arterial vasculature. MRA NECK: 1. Atheromatous change about the carotid bifurcations without hemodynamically significant greater than 50% stenosis. 2. Proximal left vertebral artery tortuous and intermittently not seen, which could be related to underlying stenoses and/or possible proximal occlusion. Left vertebral artery otherwise patent extending from the distal left V1 segment to the skull base. 3. Wide patency of the right vertebral artery without stenosis. Electronically Signed   By: Rise Mu M.D.   On: 12/29/2022 06:01   MR ANGIO HEAD WO CONTRAST  Result Date: 12/29/2022 CLINICAL DATA:  Initial evaluation for acute neuro deficit, stroke suspected. EXAM: MRI HEAD WITHOUT CONTRAST MRA HEAD WITHOUT CONTRAST MRA NECK WITHOUT CONTRAST TECHNIQUE: Multiplanar, multiecho pulse sequences of the brain and surrounding structures were obtained without intravenous contrast. Angiographic images of the Circle of Willis were obtained using MRA  technique without  intravenous contrast. Angiographic images of the neck were obtained using MRA technique without intravenous contrast. Carotid stenosis measurements (when applicable) are obtained utilizing NASCET criteria, using the distal internal carotid diameter as the denominator. COMPARISON:  CT from earlier the same day. FINDINGS: MRI HEAD FINDINGS Brain: Cerebral volume within normal limits. Patchy and confluent T2/FLAIR hyperintensity involving the periventricular and deep white matter both cerebral hemispheres, most like related chronic microvascular ischemic disease, moderately advanced in nature. Encephalomalacia and gliosis with associated chronic hemosiderin staining noted at the right temporoccipital junction. Few additional scattered foci of chronic hemosiderin staining noted about the left occipital and right frontal lobes, suggesting prior hemorrhage at these locations. Few scattered superimposed chronic micro hemorrhages, most likely hypertensive in nature. No abnormal foci of restricted diffusion to suggest acute or subacute ischemia. Gray-white matter differentiation maintained. No acute intracranial hemorrhage. No mass lesion, mass effect or midline shift. Mild ex vacuo dilatation of the right lateral ventricle without hydrocephalus. No extra-axial fluid collection. Pituitary gland and suprasellar region within normal limits. Vascular: Major intracranial vascular flow voids are maintained. Skull and upper cervical spine: Craniocervical junction with level limits. Bone marrow signal intensity normal. No scalp soft tissue abnormality. Sinuses/Orbits: Prior bilateral ocular lens replacement. Mild mucosal thickening noted about the paranasal sinuses. No air-fluid levels. Trace left mastoid effusion, of doubtful significance. Other: None. MRA HEAD FINDINGS ANTERIOR CIRCULATION: Atheromatous change about the left carotid siphon with associated short-segment moderate to severe stenosis at the proximal cavernous segment  (series 1, image 84). No stenosis about the contralateral right siphon. A1 segments patent bilaterally. Normal anterior communicating artery complex. Anterior cerebral arteries patent without stenosis. No M1 stenosis or occlusion. Distal MCA branches perfused and symmetric. POSTERIOR CIRCULATION: Both V4 segments patent without stenosis. Both PICA patent. Basilar patent without stenosis. Superior cerebellar and posterior cerebral arteries patent bilaterally without significant stenosis. Anatomic variants: None significant.  No intracranial aneurysm. MRA NECK FINDINGS AORTIC ARCH: Partially visualized aortic arch within normal limits for caliber with standard branch pattern. No visible stenosis about the origin of the great vessels. RIGHT CAROTID SYSTEM: Right common and internal carotid arteries are patent with antegrade flow. No evidence for dissection. Mild atheromatous irregularity about the right carotid bulb without hemodynamically significant greater than 50% stenosis. LEFT CAROTID SYSTEM: Left common and internal carotid arteries are patent with antegrade flow. No evidence for dissection. Atheromatous irregularity about the left carotid bulb without hemodynamically significant greater than 50% stenosis. VERTEBRAL ARTERIES: Both vertebral arteries arise from the subclavian arteries. Proximal left vertebral artery tortuous and intermittently not seen, which could be related to severe stenoses and/or possible proximal occlusion. Left vertebral artery patent with antegrade flow extending from the distal left V1 segment to the skull base. Right vertebral artery patent without stenosis. No visible evidence for dissection. Other: None. IMPRESSION: MRI HEAD: 1. No acute intracranial abnormality. 2. 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 Few additional scattered foci of chronic hemosiderin staining about the left occipital and right frontal lobes,  suggesting prior hemorrhage at these locations. 3. Underlying moderate chronic microvascular ischemic disease. MRA HEAD: 1. Negative intracranial MRA for large vessel occlusion. 2. Atheromatous change about the left carotid siphon with associated short-segment moderate to severe stenosis at the proximal cavernous segment. 3. Otherwise wide patency of the major intracranial arterial vasculature. MRA NECK: 1. Atheromatous change about the carotid bifurcations without hemodynamically significant greater than 50% stenosis. 2. Proximal left vertebral artery tortuous and intermittently not  seen, which could be related to underlying stenoses and/or possible proximal occlusion. Left vertebral artery otherwise patent extending from the distal left V1 segment to the skull base. 3. Wide patency of the right vertebral artery without stenosis. Electronically Signed   By: Rise Mu M.D.   On: 12/29/2022 06:01   MR ANGIO NECK WO CONTRAST  Result Date: 12/29/2022 CLINICAL DATA:  Initial evaluation for acute neuro deficit, stroke suspected. EXAM: MRI HEAD WITHOUT CONTRAST MRA HEAD WITHOUT CONTRAST MRA NECK WITHOUT CONTRAST TECHNIQUE: Multiplanar, multiecho pulse sequences of the brain and surrounding structures were obtained without intravenous contrast. Angiographic images of the Circle of Willis were obtained using MRA technique without intravenous contrast. Angiographic images of the neck were obtained using MRA technique without intravenous contrast. Carotid stenosis measurements (when applicable) are obtained utilizing NASCET criteria, using the distal internal carotid diameter as the denominator. COMPARISON:  CT from earlier the same day. FINDINGS: MRI HEAD FINDINGS Brain: Cerebral volume within normal limits. Patchy and confluent T2/FLAIR hyperintensity involving the periventricular and deep white matter both cerebral hemispheres, most like related chronic microvascular ischemic disease, moderately advanced in  nature. Encephalomalacia and gliosis with associated chronic hemosiderin staining noted at the right temporoccipital junction. Few additional scattered foci of chronic hemosiderin staining noted about the left occipital and right frontal lobes, suggesting prior hemorrhage at these locations. Few scattered superimposed chronic micro hemorrhages, most likely hypertensive in nature. No abnormal foci of restricted diffusion to suggest acute or subacute ischemia. Gray-white matter differentiation maintained. No acute intracranial hemorrhage. No mass lesion, mass effect or midline shift. Mild ex vacuo dilatation of the right lateral ventricle without hydrocephalus. No extra-axial fluid collection. Pituitary gland and suprasellar region within normal limits. Vascular: Major intracranial vascular flow voids are maintained. Skull and upper cervical spine: Craniocervical junction with level limits. Bone marrow signal intensity normal. No scalp soft tissue abnormality. Sinuses/Orbits: Prior bilateral ocular lens replacement. Mild mucosal thickening noted about the paranasal sinuses. No air-fluid levels. Trace left mastoid effusion, of doubtful significance. Other: None. MRA HEAD FINDINGS ANTERIOR CIRCULATION: Atheromatous change about the left carotid siphon with associated short-segment moderate to severe stenosis at the proximal cavernous segment (series 1, image 84). No stenosis about the contralateral right siphon. A1 segments patent bilaterally. Normal anterior communicating artery complex. Anterior cerebral arteries patent without stenosis. No M1 stenosis or occlusion. Distal MCA branches perfused and symmetric. POSTERIOR CIRCULATION: Both V4 segments patent without stenosis. Both PICA patent. Basilar patent without stenosis. Superior cerebellar and posterior cerebral arteries patent bilaterally without significant stenosis. Anatomic variants: None significant.  No intracranial aneurysm. MRA NECK FINDINGS AORTIC ARCH:  Partially visualized aortic arch within normal limits for caliber with standard branch pattern. No visible stenosis about the origin of the great vessels. RIGHT CAROTID SYSTEM: Right common and internal carotid arteries are patent with antegrade flow. No evidence for dissection. Mild atheromatous irregularity about the right carotid bulb without hemodynamically significant greater than 50% stenosis. LEFT CAROTID SYSTEM: Left common and internal carotid arteries are patent with antegrade flow. No evidence for dissection. Atheromatous irregularity about the left carotid bulb without hemodynamically significant greater than 50% stenosis. VERTEBRAL ARTERIES: Both vertebral arteries arise from the subclavian arteries. Proximal left vertebral artery tortuous and intermittently not seen, which could be related to severe stenoses and/or possible proximal occlusion. Left vertebral artery patent with antegrade flow extending from the distal left V1 segment to the skull base. Right vertebral artery patent without stenosis. No visible evidence for dissection. Other: None. IMPRESSION: MRI  HEAD: 1. No acute intracranial abnormality. 2. 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 Few additional scattered foci of chronic hemosiderin staining about the left occipital and right frontal lobes, suggesting prior hemorrhage at these locations. 3. Underlying moderate chronic microvascular ischemic disease. MRA HEAD: 1. Negative intracranial MRA for large vessel occlusion. 2. Atheromatous change about the left carotid siphon with associated short-segment moderate to severe stenosis at the proximal cavernous segment. 3. Otherwise wide patency of the major intracranial arterial vasculature. MRA NECK: 1. Atheromatous change about the carotid bifurcations without hemodynamically significant greater than 50% stenosis. 2. Proximal left vertebral artery tortuous and intermittently not  seen, which could be related to underlying stenoses and/or possible proximal occlusion. Left vertebral artery otherwise patent extending from the distal left V1 segment to the skull base. 3. Wide patency of the right vertebral artery without stenosis. Electronically Signed   By: Rise Mu M.D.   On: 12/29/2022 06:01   CT HEAD CODE STROKE WO CONTRAST  Result Date: 12/28/2022 CLINICAL DATA:  Code stroke. Initial evaluation for acute neuro deficit, stroke. EXAM: CT HEAD WITHOUT CONTRAST TECHNIQUE: Contiguous axial images were obtained from the base of the skull through the vertex without intravenous contrast. RADIATION DOSE REDUCTION: This exam was performed according to the departmental dose-optimization program which includes automated exposure control, adjustment of the mA and/or kV according to patient size and/or use of iterative reconstruction technique. COMPARISON:  Prior study from 01/11/2022. FINDINGS: Brain: Cerebral volume within normal limits. Moderate chronic microvascular ischemic disease. Chronic encephalomalacia at the right occipital lobe, stable. No acute intracranial hemorrhage. No visible acute large vessel territory infarct. No mass lesion or midline shift. No hydrocephalus or extra-axial fluid collection. Vascular: No abnormal hyperdense vessel. Scattered vascular calcifications noted within the carotid siphons. Skull: Scalp soft tissues within normal limits.  Calvarium intact. Sinuses/Orbits: Globes and orbital soft tissues within normal limits. Paranasal sinuses are largely clear. No significant mastoid effusion. Other: None. ASPECTS [facility] Stroke Program Early CT Score) - Ganglionic level infarction (caudate, lentiform nuclei, internal capsule, insula, M1-M3 cortex): 7 - Supraganglionic infarction (M4-M6 cortex): 3 Total score (0-10 with 10 being normal): 10 IMPRESSION: 1. No acute intracranial abnormality. 2. ASPECTS is 10. 3. Moderate chronic microvascular ischemic disease  with chronic encephalomalacia at the right occipital lobe, stable. These results were communicated to Dr. Otelia Limes at 8:12 pm on 12/28/2022 by text page via the [facility] messaging system. Electronically Signed   By: Rise Mu M.D.   On: 12/28/2022 20:14   DG CHEST PORT 1 VIEW  Result Date: 12/27/2022 CLINICAL DATA:  Respiratory distress. EXAM: PORTABLE CHEST 1 VIEW COMPARISON:  12/23/2022 FINDINGS: Low volume film. Cardiopericardial silhouette is at upper limits of normal for size. Diffuse interstitial lung disease again noted with bibasilar atelectasis or infiltrate. No substantial pleural effusion. No acute bony abnormality. IMPRESSION: Low volume film with diffuse interstitial lung disease and bibasilar atelectasis or infiltrate. Electronically Signed   By: Kennith Center M.D.   On: 12/27/2022 08:53   DG Chest Port 1 View  Result Date: 12/23/2022 CLINICAL DATA:  Shortness of breath EXAM: PORTABLE CHEST 1 VIEW COMPARISON:  07/16/2022 FINDINGS: Slightly shallow inspiration. Mild cardiac enlargement. No vascular congestion. Peribronchial thickening with coarse interstitial changes in the perihilar region likely representing chronic bronchitis. No airspace disease or consolidation. No pleural effusions. No pneumothorax. Mediastinal contours appear intact. Degenerative changes in the spine. Postoperative changes in the cervical spine. IMPRESSION: Chronic bronchitic changes in the lungs. Mild  cardiac enlargement. No focal consolidation. Electronically Signed   By: Burman Nieves M.D.   On: 12/23/2022 01:04   US RENAL  Result Date: 12/20/2022 CLINICAL DATA:  Urinary retention.  Acute kidney injury. EXAM: RENAL / URINARY TRACT ULTRASOUND COMPLETE COMPARISON:  CT 12/13/2022 FINDINGS: Right Kidney: Renal measurements: 9.8 x 5.7 x 5.1 cm = volume: 149 mL. Echogenicity within normal limits. No mass or hydronephrosis visualized. Left Kidney: Renal measurements: 10.4 x 5.1 x 5.1 cm = volume: 140 mL.  Echogenicity within normal limits. No mass or hydronephrosis visualized. Bladder: Appears normal for degree of bladder distention. Other: None. IMPRESSION: Normal ultrasound appearance of the kidneys. No mass or hydronephrosis identified. Electronically Signed   By: Burman Nieves M.D.   On: 12/20/2022 02:36   CT ABDOMEN PELVIS WO CONTRAST  Result Date: 12/13/2022 CLINICAL DATA:  Abdominal pain, acute, nonlocalized. Lower abdominal pain. Diarrhea for 3 days. EXAM: CT ABDOMEN AND PELVIS WITHOUT CONTRAST TECHNIQUE: Multidetector CT imaging of the abdomen and pelvis was performed following the standard protocol without IV contrast. RADIATION DOSE REDUCTION: This exam was performed according to the departmental dose-optimization program which includes automated exposure control, adjustment of the mA and/or kV according to patient size and/or use of iterative reconstruction technique. COMPARISON:  None Available. FINDINGS: Lower chest: There are patchy areas of scarring/atelectasis in the visualized lung bases. No overt consolidation. No pleural effusion. The heart is normal in size. No pericardial effusion. Hepatobiliary: The liver is normal in size. Non-cirrhotic configuration. No suspicious mass. There is a subcentimeter sized hypoattenuating focus in the left hepatic lobe, segment 4A, which is too small to adequately characterize. No intrahepatic or extrahepatic bile duct dilation. No calcified gallstones. Focal fundal adenomyomatosis noted. Otherwise normal gallbladder wall thickness. No pericholecystic inflammatory changes. Pancreas: Unremarkable. No pancreatic ductal dilatation or surrounding inflammatory changes. Spleen: Within normal limits. No focal lesion. Adrenals/Urinary Tract: Adrenal glands are unremarkable. No suspicious renal mass. No hydronephrosis. No renal or ureteric calculi. Unremarkable urinary bladder. Stomach/Bowel: No disproportionate dilation of the small or large bowel loops. The  appendix was not visualized; however there is no acute inflammatory process in the right lower quadrant. There is mild-to-moderate circumferential thickening of the descending colon and proximal sigmoid colon with mild-to-moderate surrounding pericolonic fat stranding and prominence of vasa recta, compatible with colitis. There is no pneumatosis, portal venous gas, pneumoperitoneum or walled-off abscess/loculated collection. Vascular/Lymphatic: No ascites or pneumoperitoneum. No abdominal or pelvic lymphadenopathy, by size criteria. No aneurysmal dilation of the major abdominal arteries. There are moderate peripheral atherosclerotic vascular calcifications of the aorta and its major branches. Reproductive: The uterus is surgically absent. No large adnexal mass. Other: The visualized soft tissues and abdominal wall are unremarkable. Musculoskeletal: No suspicious osseous lesions. There are moderate multilevel degenerative changes in the visualized spine. IMPRESSION: *Findings compatible with acute uncomplicated colitis involving the descending colon and proximal sigmoid colon. Differential diagnosis includes infectious, inflammatory or ischemic etiology, likely in that order. *Multiple other nonacute observations, as described above. Electronically Signed   By: Jules Schick M.D.   On: 12/13/2022 15:47     PHYSICAL EXAM  Temp:  [97.6 F (36.4 C)-98.5 F (36.9 C)] 98.4 F (36.9 C) (12/10 1127) Pulse Rate:  [58-72] 65 (12/10 1127) Resp:  [15-19] 17 (12/10 1127) BP: (143-158)/(43-64) 153/55 (12/10 1127) SpO2:  [90 %-100 %] 100 % (12/10 1127) FiO2 (%):  [1 %-2 %] 1 % (12/09 2342)  General - Well nourished, well developed, in no apparent distress.  Ophthalmologic -  fundi not visualized due to noncooperation.  Cardiovascular - Regular rhythm and rate.  Neuro - awake, alert, eyes open, orientated to age, place, time and people. No aphasia, fluent language, following all simple commands. Able to name  and repeat. No gaze palsy, tracking bilaterally, visual field full. No facial droop. Tongue midline. Bilateral UEs 5/5, no drift. Bilaterally LEs 3/5, no drift. Sensation symmetrical bilaterally, b/l FTN intact, gait not tested.    ASSESSMENT/PLAN [name] is a 89 y.o. female with history of hypertension, hyperlipidemia, diabetes, CKD3, anxiety, depression, ICH and TIA admitted for AKI on CKD, renal function improving.  However developed episode of left upper extremity and face numbness, for which code stroke activated. No tPA given due to mild symptoms.    Likely anxiety episode.  Cannot rule out migraine equivalent.  Less likely TIA. CT no acute abnormality.  Chronic right occipital encephalomalacia. MRI no acute infarct MRA head and neck left siphon moderate to severe short segment stenosis, left we 1 tortuosity with intermittent occlusion 2D Echo EF 60 to 65% LDL 37 HgbA1c 8.2 Heparin subcu for VTE prophylaxis aspirin 81 mg daily and clopidogrel 75 mg daily prior to admission, now on aspirin 81 mg daily and clopidogrel 75 mg daily.  Continue home meds on discharge. Patient counseled to be compliant with her antithrombotic medications Ongoing aggressive stroke risk factor management Therapy recommendations: Home health PT OT Disposition: Pending  History of stroke/TIA 11/2010 left parietal convexity SAH, questionable RCVS 03/2018 right occipital cortex tiny infarct, MRA and carotid Doppler negative.  LDL 83, A1c 9.4. 02/2019 TIA versus complicated migraine.  EEG negative. 06/2019 admitted for right parietal occipital ICH. Enrolled for SATURN trial.   02/2021 episode of imbalance, lightheadedness.  MRI negative for acute infarct 12/2021 admitted for right brain TIA.  MRI negative.  LDL 102, A1c 10.0.  Discharged on DAPT and Lipitor 80.  2 days after ED visit for slurred speech.  MRI again no acute infarct. Follow-up with Dr. Pearlean Brownie at [facility]  AKI on CKD Creatinine  5.00--4.11--3.62--3.13--3.40--3.14 Improving Management per primary team  Diabetes HgbA1c 8.2 goal < 7.0 Controlled CBG monitoring SSI DM education and close PCP follow up  Hypertension Stable on the high and Increase hydralazine to 25 mg 3 times daily Long term BP goal normotensive  Hyperlipidemia Home meds: Lipitor 80 LDL 37, goal < 70 Now on Lipitor 80 Continue statin at discharge  Other Stroke Risk Factors Advanced age Coronary artery disease/MI History of migraines in young  Other Active Problems Anxiety Depression  Hospital day # 10  Neurology will sign off. Please call with questions. Pt will follow up with stroke clinic NP at [facility] on 02/04/2023 as scheduled. Thanks for the consult.   Marvel Plan, MD PhD Stroke Neurology 12/29/2022 2:44 PM    To contact Stroke Continuity provider, please refer to [email or web address]. After hours, contact General Neurology

## 2022-12-29 NOTE — Progress Notes (Signed)
Occupational Therapy Treatment Patient Details Name: Sandra [name] MRN: [id number] DOB: May 31, 1933 Today's Date: 12/29/2022   History of present illness Pt is an 89 y/o F presenting to ED on 11/30 with weakness, diarrhea, and emesis. Labs concerning for worsening renal insufficiency for creatinine >5. Admitted for further mgmt of AKI. Code stroek called 12/9 for L UE and L facial numbness, MRI head negative.  PMH includes diabetes.   OT comments  Pt progressing towards this session, noted pt with code stroke yesterday and  negative MRI, reports LUE and L facial symptoms have resolved. Pt needing set up - min A for ADLs, min A for bed mobility and min A for transfers without AD. Pt with LOB x1 needing up to min A to correct. SpO2 down to 84% on RA with hallway ambulation, incr to 90's with 2L O2 donned. Pt presenting with impairment listed below, will follow acutely. Continue to recommend HHOT at d/c.       If plan is discharge home, recommend the following:  A little help with walking and/or transfers;A little help with bathing/dressing/bathroom;Assistance with cooking/housework;Direct supervision/assist for financial management;Direct supervision/assist for medications management;Supervision due to cognitive status   Equipment Recommendations  None recommended by OT    Recommendations for Other Services PT consult    Precautions / Restrictions Precautions Precautions: Fall Restrictions Weight Bearing Restrictions: No       Mobility Bed Mobility Overal bed mobility: Needs Assistance Bed Mobility: Supine to Sit     Supine to sit: Min assist          Transfers Overall transfer level: Needs assistance Equipment used: None Transfers: Sit to/from Stand Sit to Stand: Min assist           General transfer comment: LOB x1 to R side     Balance Overall balance assessment: Needs assistance Sitting-balance support: Feet supported Sitting balance-Leahy Scale: Normal      Standing balance support: During functional activity, No upper extremity supported Standing balance-Leahy Scale: Fair Standing balance comment: without AD                           ADL either performed or assessed with clinical judgement   ADL Overall ADL's : Needs assistance/impaired Eating/Feeding: Set up   Grooming: Set up;Sitting;Standing   Upper Body Bathing: Minimal assistance   Lower Body Bathing: Minimal assistance   Upper Body Dressing : Minimal assistance   Lower Body Dressing: Minimal assistance   Toilet Transfer: Minimal assistance   Toileting- Clothing Manipulation and Hygiene: Minimal assistance       Functional mobility during ADLs: Minimal assistance      Extremity/Trunk Assessment Upper Extremity Assessment Upper Extremity Assessment: Generalized weakness   Lower Extremity Assessment Lower Extremity Assessment: Defer to PT evaluation        Vision   Vision Assessment?: No apparent visual deficits   Perception Perception Perception: Not tested   Praxis Praxis Praxis: Not tested    Cognition Arousal: Alert Behavior During Therapy: WFL for tasks assessed/performed Overall Cognitive Status: Within Functional Limits for tasks assessed                                          Exercises      Shoulder Instructions       General Comments SpO2 down to 84% on RA, incr to 90's on  2L o2 after rest    Pertinent Vitals/ Pain       Pain Assessment Pain Assessment: No/denies pain  Home Living                                          Prior Functioning/Environment              Frequency  Min 1X/week        Progress Toward Goals  OT Goals(current goals can now be found in the care plan section)  Progress towards OT goals: Progressing toward goals  Acute Rehab OT Goals Patient Stated Goal: none stated OT Goal Formulation: With patient Time For Goal Achievement: 01/03/23 Potential  to Achieve Goals: Good ADL Goals Pt Will Perform Upper Body Dressing: with modified independence;sitting Pt Will Perform Lower Body Dressing: with modified independence;sitting/lateral leans;sit to/from stand Pt Will Transfer to Toilet: with modified independence;ambulating;regular height toilet Pt Will Perform Tub/Shower Transfer: Tub transfer;Shower transfer;with modified independence;shower seat;ambulating Additional ADL Goal #1: pt will be able to tolerate OOB activity x10 min in order to improve ind with ADLs  Plan      Co-evaluation                 AM-PAC OT "6 Clicks" Daily Activity     Outcome Measure   Help from another person eating meals?: None Help from another person taking care of personal grooming?: A Little Help from another person toileting, which includes using toliet, bedpan, or urinal?: A Little Help from another person bathing (including washing, rinsing, drying)?: A Little Help from another person to put on and taking off regular upper body clothing?: A Little Help from another person to put on and taking off regular lower body clothing?: A Little 6 Click Score: 19    End of Session Equipment Utilized During Treatment: Gait belt  OT Visit Diagnosis: Unsteadiness on feet (R26.81);Other abnormalities of gait and mobility (R26.89);Muscle weakness (generalized) (M62.81)   Activity Tolerance Patient tolerated treatment well   Patient Left in bed;with call bell/phone within reach;with bed alarm set;with family/visitor present   Nurse Communication Mobility status        Time: 4696-2952 OT Time Calculation (min): 22 min  Charges: OT General Charges $OT Visit: 1 Visit OT Treatments $Therapeutic Activity: 8-22 mins  Carver Fila, OTD, OTR/L SecureChat Preferred Acute Rehab [phone]   [name] 12/29/2022, 10:47 AM

## 2022-12-29 NOTE — Progress Notes (Signed)
Nutrition Follow-up  DOCUMENTATION CODES:   Non-severe (moderate) malnutrition in context of acute illness/injury  INTERVENTION:   - Liberalize diet to promote PO intake  - Continue Boost Breeze po TID, each supplement provides 250 kcal and 9 grams of protein - Renal MVI   NUTRITION DIAGNOSIS:   Moderate Malnutrition related to acute illness as evidenced by estimated needs, moderate fat depletion, moderate muscle depletion.  - still applicable   GOAL:   Patient will meet greater than or equal to 90% of their needs  - ongoing   MONITOR:   PO intake, Supplement acceptance, Weight trends  REASON FOR ASSESSMENT:   Malnutrition Screening Tool    ASSESSMENT:   89 y.o. f Presented to ED with c/o N/V/D,admitted with worsening renal insufficiency. PMH: TIA, SAH, GERD, Depression, HTN, DMT2, Stroke, anxiety, CKD III.  12/8 - CKD stage 5, not HD candidate  12/9 - Code stroke  12/20 - MRI showed no acute infarct  Pt to D/C today. Pt progressed from AKI in the setting of dehydration/poor oral intake to CKD stage 5. Mild pulmonary edema noted, pt was diuresis today. She is still making urine.   Pt states she has not had an appetite and has been feeling full. She had 1 pancake with fruit for breakfast and 50% of a chicken pizza for lunch. Per documentation her meal intake varies from 25-90%. She reports she has only been drinking 1 Boost Breeze/day because she is worried about her blood sugars.Encouraged pt to drink Boost's and increase protein intake.   Weights has been fluctuating due to fluid.   Admit weight: 55 kg  Current weight: 61 kg    Average Meal Intake: 12/2-12/9: 58% intake x 8 recorded meals  Nutritionally Relevant Medications: Scheduled Meds:  vitamin B-12  1,000 mcg Oral Daily   insulin aspart  0-5 Units Subcutaneous QHS   insulin aspart  0-9 Units Subcutaneous TID WC   metoprolol succinate  50 mg Oral Daily   sodium bicarbonate  1,300 mg Oral TID    Continuous Infusions: PRN Meds:.acetaminophen, clonazePAM, hydrALAZINE, ipratropium-albuterol, melatonin, prochlorperazine  Labs Reviewed: BUN, Creatinine, Calcium 7.9, Phosphorus 5.7 (12/01),Magnesium 1.3 (12/09) CBG ranges from 112-316 mg/dL over the last 24 hours HgbA1c 8.2 (11/30/22)  Diet Order:   Diet Order             Diet regular Room service appropriate? Yes; Fluid consistency: Thin; Fluid restriction: 1200 mL Fluid  Diet effective now           Diet renal/carb modified with fluid restriction                   EDUCATION NEEDS:   Education needs have been addressed  Skin:  Skin Assessment: Reviewed RN Assessment  Last BM:  12/26/2022  Height:   Ht Readings from Last 1 Encounters:  12/19/22 5' (1.524 m)    Weight:   Wt Readings from Last 1 Encounters:  12/28/22 61 kg    BMI:  Body mass index is 26.26 kg/m.  Estimated Nutritional Needs:   Kcal:  1850-2150 kcal/d  Protein:  80-95 g/d  Fluid:  51ml/kcal   Elliot Dally, RD Registered Dietitian  See Amion for more information

## 2022-12-30 ENCOUNTER — Telehealth: Payer: Self-pay | Admitting: Neurology

## 2022-12-30 NOTE — Telephone Encounter (Signed)
Daughter calling about upcoming appointment with Shanda Bumps, NP

## 2022-12-31 ENCOUNTER — Ambulatory Visit: Payer: Medicare HMO | Admitting: "Endocrinology

## 2022-12-31 ENCOUNTER — Telehealth: Payer: Self-pay | Admitting: Family

## 2022-12-31 DIAGNOSIS — J849 Interstitial pulmonary disease, unspecified: Secondary | ICD-10-CM | POA: Diagnosis not present

## 2022-12-31 DIAGNOSIS — E1159 Type 2 diabetes mellitus with other circulatory complications: Secondary | ICD-10-CM | POA: Diagnosis not present

## 2022-12-31 DIAGNOSIS — I509 Heart failure, unspecified: Secondary | ICD-10-CM | POA: Diagnosis not present

## 2022-12-31 DIAGNOSIS — E860 Dehydration: Secondary | ICD-10-CM | POA: Diagnosis not present

## 2022-12-31 DIAGNOSIS — E1165 Type 2 diabetes mellitus with hyperglycemia: Secondary | ICD-10-CM | POA: Diagnosis not present

## 2022-12-31 DIAGNOSIS — N179 Acute kidney failure, unspecified: Secondary | ICD-10-CM | POA: Diagnosis not present

## 2022-12-31 DIAGNOSIS — E1122 Type 2 diabetes mellitus with diabetic chronic kidney disease: Secondary | ICD-10-CM | POA: Diagnosis not present

## 2022-12-31 DIAGNOSIS — I132 Hypertensive heart and chronic kidney disease with heart failure and with stage 5 chronic kidney disease, or end stage renal disease: Secondary | ICD-10-CM | POA: Diagnosis not present

## 2022-12-31 DIAGNOSIS — N185 Chronic kidney disease, stage 5: Secondary | ICD-10-CM | POA: Diagnosis not present

## 2022-12-31 NOTE — Telephone Encounter (Signed)
Noted  

## 2022-12-31 NOTE — Telephone Encounter (Signed)
Spoke with patients daughter and Angelique Blonder with [facility]. Patient is scheduled to be seen tomorrow at 1:40 pm and was advise to seek medial attention if any signes of distress prior to appointment. Patients daughter and Angelique Blonder verbalized understanding.

## 2022-12-31 NOTE — Telephone Encounter (Signed)
Denise from [facility] called to say she had been with patient today since her discharge from the hospital, and initially her O2 sats were 97, but they have been steadily dropping to now 87. The hospital didn't send her home with oxygen. Angelique Blonder would like someone to call her back regarding this [phone]).

## 2023-01-01 ENCOUNTER — Ambulatory Visit (INDEPENDENT_AMBULATORY_CARE_PROVIDER_SITE_OTHER): Payer: Medicare HMO | Admitting: Nurse Practitioner

## 2023-01-01 VITALS — BP 148/70 | HR 60 | Temp 97.9°F | Resp 19 | Ht 60.0 in | Wt 126.2 lb

## 2023-01-01 DIAGNOSIS — Z794 Long term (current) use of insulin: Secondary | ICD-10-CM | POA: Diagnosis not present

## 2023-01-01 DIAGNOSIS — I10 Essential (primary) hypertension: Secondary | ICD-10-CM | POA: Diagnosis not present

## 2023-01-01 DIAGNOSIS — F32A Depression, unspecified: Secondary | ICD-10-CM | POA: Diagnosis not present

## 2023-01-01 DIAGNOSIS — K219 Gastro-esophageal reflux disease without esophagitis: Secondary | ICD-10-CM

## 2023-01-01 DIAGNOSIS — J849 Interstitial pulmonary disease, unspecified: Secondary | ICD-10-CM | POA: Diagnosis not present

## 2023-01-01 DIAGNOSIS — G459 Transient cerebral ischemic attack, unspecified: Secondary | ICD-10-CM | POA: Diagnosis not present

## 2023-01-01 DIAGNOSIS — I2510 Atherosclerotic heart disease of native coronary artery without angina pectoris: Secondary | ICD-10-CM | POA: Diagnosis not present

## 2023-01-01 DIAGNOSIS — E785 Hyperlipidemia, unspecified: Secondary | ICD-10-CM

## 2023-01-01 DIAGNOSIS — N184 Chronic kidney disease, stage 4 (severe): Secondary | ICD-10-CM

## 2023-01-01 DIAGNOSIS — E1122 Type 2 diabetes mellitus with diabetic chronic kidney disease: Secondary | ICD-10-CM

## 2023-01-01 DIAGNOSIS — R609 Edema, unspecified: Secondary | ICD-10-CM

## 2023-01-01 LAB — CBC WITH DIFFERENTIAL/PLATELET
Absolute Lymphocytes: 1610 {cells}/uL (ref 850–3900)
Absolute Monocytes: 581 {cells}/uL (ref 200–950)
Basophils Absolute: 42 {cells}/uL (ref 0–200)
Basophils Relative: 0.6 %
Eosinophils Absolute: 497 {cells}/uL (ref 15–500)
Eosinophils Relative: 7.1 %
HCT: 24.6 % — ABNORMAL LOW (ref 35.0–45.0)
Hemoglobin: 7.8 g/dL — ABNORMAL LOW (ref 11.7–15.5)
MCH: 31.8 pg (ref 27.0–33.0)
MCHC: 31.7 g/dL — ABNORMAL LOW (ref 32.0–36.0)
MCV: 100.4 fL — ABNORMAL HIGH (ref 80.0–100.0)
MPV: 11.1 fL (ref 7.5–12.5)
Monocytes Relative: 8.3 %
Neutro Abs: 4270 {cells}/uL (ref 1500–7800)
Neutrophils Relative %: 61 %
Platelets: 297 10*3/uL (ref 140–400)
RBC: 2.45 10*6/uL — ABNORMAL LOW (ref 3.80–5.10)
RDW: 11.5 % (ref 11.0–15.0)
Total Lymphocyte: 23 %
WBC: 7 10*3/uL (ref 3.8–10.8)

## 2023-01-01 LAB — COMPLETE METABOLIC PANEL WITH GFR
AG Ratio: 1.4 (calc) (ref 1.0–2.5)
ALT: 10 U/L (ref 6–29)
AST: 14 U/L (ref 10–35)
Albumin: 3.7 g/dL (ref 3.6–5.1)
Alkaline phosphatase (APISO): 83 U/L (ref 37–153)
BUN/Creatinine Ratio: 10 (calc) (ref 6–22)
BUN: 35 mg/dL — ABNORMAL HIGH (ref 7–25)
CO2: 28 mmol/L (ref 20–32)
Calcium: 8.4 mg/dL — ABNORMAL LOW (ref 8.6–10.4)
Chloride: 101 mmol/L (ref 98–110)
Creat: 3.52 mg/dL — ABNORMAL HIGH (ref 0.60–0.95)
Globulin: 2.6 g/dL (ref 1.9–3.7)
Glucose, Bld: 202 mg/dL — ABNORMAL HIGH (ref 65–99)
Potassium: 3.8 mmol/L (ref 3.5–5.3)
Sodium: 139 mmol/L (ref 135–146)
Total Bilirubin: 0.5 mg/dL (ref 0.2–1.2)
Total Protein: 6.3 g/dL (ref 6.1–8.1)
eGFR: 12 mL/min/{1.73_m2} — ABNORMAL LOW (ref >=60)

## 2023-01-01 NOTE — Assessment & Plan Note (Signed)
On statin, Plavix, ASA

## 2023-01-01 NOTE — Assessment & Plan Note (Signed)
Blood pressure is controlled,  taking Metoprolol, Hydralazine, Prn Furosemide

## 2023-01-01 NOTE — Assessment & Plan Note (Signed)
Hx of CAD, stroke, on ASA, Plavix, Statin, EF 60%

## 2023-01-01 NOTE — Assessment & Plan Note (Signed)
taking Famotidine

## 2023-01-01 NOTE — Assessment & Plan Note (Signed)
Prn Furosemide

## 2023-01-01 NOTE — Assessment & Plan Note (Addendum)
Hospitalized 12/19/22-12/29/22 for acute rental failure, not on dialysis, renal US normal, AMS, f/u nephrology. Presented to ED with abd pain, N/V,  CT abd showed colitis descending colon and proximal sigmoid colon, treated with Cipro and Flagyl.  Bun/creat 28/3.14 12/29/22 Repeat CMP/eGFR on insulin, Glimepiride, Hgb A1c 8.2 11/30/22

## 2023-01-01 NOTE — Assessment & Plan Note (Signed)
taking Clonazepam, Citalopram, TSH 3.5 06/08/22

## 2023-01-01 NOTE — Progress Notes (Unsigned)
Location:   [facility]   Place of Service:    Provider: Chipper Oman NP  Code Status: DNR Goals of Care:     01/01/2023    1:37 PM  Advanced Directives  Does Patient Have a Medical Advance Directive? No  Type of Advance Directive Living will  Does patient want to make changes to medical advance directive? No - Patient declined     Chief Complaint  Patient presents with   Follow-up    Patient is being seen for a hospital follow up. Patient state she had colitis was sent home , then she felt worse and was having some more kidney pain. Patient states she was in the hospital for 10 days     HPI: [name] seen today for hospital follow up   Hospitalized 12/19/22-12/29/22 for acute rental failure, not on dialysis, renal US normal, AMS, f/u nephrology. Presented to ED with abd pain, N/V,  CT abd showed colitis descending colon and proximal sigmoid colon, treated with Cipro and Flagyl.   Edema, prn Furosemide   HTN, taking Metoprolol, Hydralazine, Prn Furosemide             T2DM on insulin, Glimepiride, Hgb A1c 8.2 11/30/22             Anxiety/depression, taking Clonazepam, Citalopram, TSH 3.5 06/08/22             GERD, taking Famotidine             Hx of CAD, stroke, on ASA, Plavix, Statin, EF 60%             CKD Bun/creat 28/3.14 12/29/22  HLD, on Atorvastatin, LDL 37 12/29/22  ILD  TIA, statin, Plavix, ASA  Anemia Hgb 7.8 12/27/22, iron 111, Vit B12 238 12/21/22  Past Medical History:  Diagnosis Date   Achilles tendon injury 11/27/2010   ALLERGIC RHINITIS 05/12/2006   Qualifier: Diagnosis of  By: Drue Novel MD, Nolon Rod.    Anxiety state 07/03/2009   Qualifier: Diagnosis of  By: Drue Novel MD, Jose E.    Back pain 07/01/2010   Chronic kidney disease (CKD), stage III (moderate) (HCC) 05/31/2014   Chronic right SI joint pain 09/22/2013   DEGENERATIVE JOINT DISEASE, CERVICAL SPINE 06/23/2006   Annotation: had a CAT scan with a cervical myelogram that show  left C6-7 and   C5-6 foraminal stenosis Qualifier: Diagnosis of  By: Drue Novel MD, Nolon Rod.    DEPRESSION 05/12/2006   Qualifier: Diagnosis of  By: Drue Novel MD, Jose E.    DIABETES MELLITUS, TYPE II 05/12/2006   Now following w/ Endo at [facility]     DIABETIC  RETINOPATHY 04/27/2007   Qualifier: Diagnosis of  By: Janit Bern     Encephalopathy, hypertensive    Essential hypertension 05/12/2006   Qualifier: Diagnosis of  By: Drue Novel MD, Nolon Rod.    GAIT DISTURBANCE 08/07/2009   Qualifier: Diagnosis of  By: Drue Novel MD, Nolon Rod.    GERD (gastroesophageal reflux disease)    History of colonoscopy    History of CT scan    History of mammogram    History of MRI    Hyperlipidemia associated with type 2 diabetes mellitus (HCC) 05/12/2006   Qualifier: Diagnosis of  By: Drue Novel MD, Jose E.    ILD (interstitial lung disease) (HCC) 05/31/2015   Lobar cerebral hemorrhage (HCC) 06/22/2019   NECK PAIN, CHRONIC 04/03/2008   Qualifier: Diagnosis of  By: Drue Novel MD, Nolon Rod.  Osteoarthritis 02/10/2016   Osteopenia    Osteoporosis 06/23/2006   Annotation: had a bone density test in 08-2004.  T score was -2.4 Qualifier: Diagnosis of  By: Drue Novel MD, Nolon Rod     [facility] (subarachnoid hemorrhage) (HCC)    Scleroderma (HCC) 02/28/2016   Stroke (HCC)    Takotsubo cardiomyopathy    s/p stress MI with stress induced CM with normal coronary arteries by cath 2007 with normalization of LVF by echo 04/2005   TIA (transient ischemic attack)    Vasculitis of skin 09/28/2012   Vitamin D deficiency 10/01/2014    Past Surgical History:  Procedure Laterality Date   ABDOMINAL HYSTERECTOMY  1977   no oophorectomy   APPENDECTOMY     CARDIAC CATHETERIZATION     CATARACT EXTRACTION, BILATERAL  2011   SPINAL FUSION  2000   Dr. Venetia Maxon   TONSILLECTOMY      Allergies  Allergen Reactions   Cephalexin Nausea And Vomiting    Pt stated made severely sick, will never take again   Pioglitazone Swelling    REACTION: EDEMA   Nintedanib Diarrhea   Sertraline Nausea  And Vomiting    Allergies as of 01/01/2023       Reactions   Cephalexin Nausea And Vomiting   Pt stated made severely sick, will never take again   Pioglitazone Swelling   REACTION: EDEMA   Nintedanib Diarrhea   Sertraline Nausea And Vomiting        Medication List        Accurate as of January 01, 2023 11:59 PM. If you have any questions, ask your nurse or doctor.          acetaminophen 500 MG tablet Commonly known as: TYLENOL Take 500 mg by mouth every 6 (six) hours as needed for mild pain or headache.   aspirin EC 81 MG tablet Take 81 mg by mouth daily. Swallow whole.   atorvastatin 80 MG tablet Commonly known as: LIPITOR TAKE 1 TABLET EVERY DAY What changed: when to take this   citalopram 10 MG tablet Commonly known as: CELEXA TAKE 1 TABLET BY MOUTH EVERY DAY   clonazePAM 0.5 MG tablet Commonly known as: KLONOPIN Take 1 tablet (0.5 mg total) by mouth 2 (two) times daily as needed for anxiety.   clopidogrel 75 MG tablet Commonly known as: PLAVIX Take 1 tablet (75 mg total) by mouth daily.   cyanocobalamin 1000 MCG tablet Commonly known as: VITAMIN B12 Take 1 tablet (1,000 mcg total) by mouth daily.   famotidine 20 MG tablet Commonly known as: PEPCID Take 2 tablets (40 mg total) by mouth daily.   furosemide 20 MG tablet Commonly known as: LASIX Take 1 tablet (20 mg total) by mouth daily as needed. Take only if weight is +3 lbs greater than baseline weight in 1 day or +5 lbs in 1 week   hydrALAZINE 25 MG tablet Commonly known as: APRESOLINE Take 1 tablet (25 mg total) by mouth 3 (three) times daily.   ipratropium-albuterol 0.5-2.5 (3) MG/3ML Soln Commonly known as: DUONEB Take 3 mLs by nebulization every 6 (six) hours as needed.   metoprolol succinate 50 MG 24 hr tablet Commonly known as: TOPROL-XL Take 1 tablet (50 mg total) by mouth daily. Take with or immediately following a meal.   NovoLOG FlexPen 100 UNIT/ML FlexPen Generic drug:  insulin aspart Inject 6-8 Units into the skin 3 (three) times daily before meals. Pt completes sliding scale.   sodium bicarbonate 650 MG tablet  Take 2 tablets (1,300 mg total) by mouth 2 (two) times daily.        Review of Systems:  Review of Systems  Constitutional:  Negative for appetite change, fatigue and fever.  HENT:  Negative for congestion and trouble swallowing.   Eyes:  Negative for visual disturbance.  Respiratory:  Positive for cough. Negative for wheezing.        DOE, hacking cough  Cardiovascular:  Positive for leg swelling.  Gastrointestinal:  Positive for diarrhea. Negative for abdominal pain, constipation, nausea and vomiting.  Genitourinary:  Positive for frequency. Negative for dysuria and urgency.       2-3x/night  Musculoskeletal:  Positive for arthralgias and gait problem.  Skin:  Positive for pallor.  Neurological:  Negative for speech difficulty and headaches.  Psychiatric/Behavioral:  Negative for confusion and sleep disturbance. The patient is not nervous/anxious.     Health Maintenance  Topic Date Due   Zoster Vaccines- Shingrix (1 of 2) 01/06/1953   OPHTHALMOLOGY EXAM  07/29/2017   DTaP/Tdap/Td (3 - Tdap) 06/19/2018   Medicare Annual Wellness (AWV)  06/11/2022   COVID-19 Vaccine (4 - 2024-25 season) 09/20/2022   HEMOGLOBIN A1C  05/30/2023   FOOT EXAM  06/02/2023   Pneumonia Vaccine 28+ Years old  Completed   INFLUENZA VACCINE  Completed   DEXA SCAN  Completed   HPV VACCINES  Aged Out   MAMMOGRAM  Discontinued    Physical Exam: Vitals:   01/01/23 1339  BP: (!) 148/70  Pulse: 60  Resp: 19  Temp: 97.9 F (36.6 C)  TempSrc: Temporal  SpO2: 90%  Weight: 126 lb 3.2 oz (57.2 kg)  Height: 5' (1.524 m)   Body mass index is 24.65 kg/m. Physical Exam Vitals and nursing note reviewed.  Constitutional:      Appearance: Normal appearance.  HENT:     Head: Normocephalic and atraumatic.     Nose: Nose normal.     Mouth/Throat:     Mouth:  Mucous membranes are moist.  Eyes:     Extraocular Movements: Extraocular movements intact.     Conjunctiva/sclera: Conjunctivae normal.     Pupils: Pupils are equal, round, and reactive to light.  Cardiovascular:     Rate and Rhythm: Normal rate and regular rhythm.  Pulmonary:     Effort: Pulmonary effort is normal.     Breath sounds: Rales present.     Comments: Posterior lower lung rales.  Abdominal:     General: Bowel sounds are normal.     Palpations: Abdomen is soft.     Tenderness: There is no right CVA tenderness, left CVA tenderness, guarding or rebound.     Comments: Left flank region tenderness when palpated, but the patient stated its tender constantly, feels something inside  Genitourinary:    Comments: External hemorrhoids noted, no injury or bleeding, negative FOBT Musculoskeletal:     Cervical back: Normal range of motion and neck supple.     Right lower leg: Edema present.     Left lower leg: Edema present.     Comments: 1+ edema BLE  Skin:    General: Skin is warm and dry.  Neurological:     General: No focal deficit present.     Mental Status: She is alert and oriented to person, place, and time. Mental status is at baseline.     Gait: Gait abnormal.  Psychiatric:        Mood and Affect: Mood normal.  Behavior: Behavior normal.        Thought Content: Thought content normal.     Labs reviewed: Basic Metabolic Panel: Recent Labs    06/08/22 0808 07/16/22 0010 12/20/22 0530 12/21/22 0750 12/28/22 0704 12/29/22 0452 01/01/23 1418  NA 143   < > 141   < > 140 141 139  K 4.6   < > 4.3   < > 4.0 3.8 3.8  CL 112*   < > 119*   < > 106 104 101  CO2 23   < > 16*   < > 25 27 28   GLUCOSE 128*   < > 177*   < > 153* 132* 202*  BUN 40*   < > 53*   < > 29* 28* 35*  CREATININE 1.83*   < > 4.21*   < > 3.42* 3.14* 3.52*  CALCIUM 9.0   < > 7.9*   < > 7.9* 7.9* 8.4*  MG  --   --   --   --  1.3*  --   --   PHOS  --   --  5.7*  --   --   --   --   TSH 3.50   --   --   --   --   --   --    < > = values in this interval not displayed.   Liver Function Tests: Recent Labs    12/13/22 1323 12/20/22 0530 12/28/22 0704 12/29/22 0452 01/01/23 1418  AST 20  --  18 18 14   ALT 15  --  14 13 10   ALKPHOS 73  --  52 51  --   BILITOT 0.6  --  0.5 0.5 0.5  PROT 6.4*  --  5.5* 5.5* 6.3  ALBUMIN 3.2* 2.7* 2.7* 2.6*  --    Recent Labs    12/13/22 1323 12/19/22 1255  LIPASE 28 54*   No results for input(s): "AMMONIA" in the last 8760 hours. CBC: Recent Labs    12/22/22 0606 12/23/22 0654 12/24/22 0615 12/26/22 0536 12/27/22 0725 01/01/23 1418  WBC 7.2 7.1   < > 7.6 7.7 7.0  NEUTROABS 5.3 4.6  --   --   --  4,270  HGB 8.0* 8.3*   < > 7.5* 7.8* 7.8*  HCT 24.0* 25.0*   < > 22.6* 24.1* 24.6*  MCV 98.4 97.7   < > 97.4 98.4 100.4*  PLT 226 146*   < > 208 242 297   < > = values in this interval not displayed.   Lipid Panel: Recent Labs    01/05/22 0826 06/08/22 0808 12/29/22 0452  CHOL 142 153 95  HDL 34* 40* 35*  LDLCALC 59 80 37  TRIG 244* 240* 116  CHOLHDL 4.2 3.8 2.7   Lab Results  Component Value Date   HGBA1C 8.2 (H) 11/30/2022    Procedures since last visit: ECHOCARDIOGRAM COMPLETE Result Date: 12/29/2022    ECHOCARDIOGRAM REPORT   Patient Name:   Sandra [name] Date of Exam: 12/29/2022 Medical Rec #:  [id number]     Height:       60.0 in Accession #:    [id number]    Weight:       134.5 lb Date of Birth:  December 27, 1933    BSA:          1.577 m Patient Age:    88 years      BP:  158/61 mmHg Patient Gender: F             HR:           64 bpm. Exam Location:  Inpatient Procedure: 2D Echo, Cardiac Doppler and Color Doppler Indications:    TIA G45.9  History:        Patient has prior history of Echocardiogram examinations, most                 recent 01/05/2022. TIA and Stroke; Risk Factors:Hypertension,                 Diabetes and Dyslipidemia.  Sonographer:    Harriette Bouillon RDCS Referring Phys: [phone] SUBRINA SUNDIL  IMPRESSIONS  1. Left ventricular ejection fraction, by estimation, is 60 to 65%. The left ventricle has normal function. The left ventricle has no regional wall motion abnormalities. Left ventricular diastolic parameters are consistent with Grade II diastolic dysfunction (pseudonormalization). Elevated left atrial pressure.  2. Right ventricular systolic function is normal. The right ventricular size is normal. There is mildly elevated pulmonary artery systolic pressure. The estimated right ventricular systolic pressure is 39.0 mmHg.  3. The mitral valve is normal in structure. Trivial mitral valve regurgitation. No evidence of mitral stenosis.  4. The aortic valve is tricuspid. There is mild calcification of the aortic valve. Aortic valve regurgitation is trivial. Aortic valve sclerosis/calcification is present, without any evidence of aortic stenosis.  5. The inferior vena cava is normal in size with greater than 50% respiratory variability, suggesting right atrial pressure of 3 mmHg. Comparison(s): Prior images reviewed side by side. The left ventricular diastolic function is significantly worse. FINDINGS  Left Ventricle: Left ventricular ejection fraction, by estimation, is 60 to 65%. The left ventricle has normal function. The left ventricle has no regional wall motion abnormalities. The left ventricular internal cavity size was normal in size. There is  no left ventricular hypertrophy. Left ventricular diastolic parameters are consistent with Grade II diastolic dysfunction (pseudonormalization). Elevated left atrial pressure. Right Ventricle: The right ventricular size is normal. No increase in right ventricular wall thickness. Right ventricular systolic function is normal. There is mildly elevated pulmonary artery systolic pressure. The tricuspid regurgitant velocity is 3.00  m/s, and with an assumed right atrial pressure of 3 mmHg, the estimated right ventricular systolic pressure is 39.0 mmHg. Left Atrium:  Left atrial size was normal in size. Right Atrium: Right atrial size was normal in size. Pericardium: There is no evidence of pericardial effusion. Mitral Valve: The mitral valve is normal in structure. Trivial mitral valve regurgitation. No evidence of mitral valve stenosis. Tricuspid Valve: The tricuspid valve is normal in structure. Tricuspid valve regurgitation is not demonstrated. No evidence of tricuspid stenosis. Aortic Valve: The aortic valve is tricuspid. There is mild calcification of the aortic valve. Aortic valve regurgitation is trivial. Aortic valve sclerosis/calcification is present, without any evidence of aortic stenosis. Pulmonic Valve: The pulmonic valve was normal in structure. Pulmonic valve regurgitation is not visualized. No evidence of pulmonic stenosis. Aorta: The aortic root and ascending aorta are structurally normal, with no evidence of dilitation. Venous: The inferior vena cava is normal in size with greater than 50% respiratory variability, suggesting right atrial pressure of 3 mmHg. IAS/Shunts: No atrial level shunt detected by color flow Doppler.  LEFT VENTRICLE PLAX 2D LVIDd:         4.20 cm   Diastology LVIDs:         2.40 cm   LV e' medial:  5.44 cm/s LV PW:         0.90 cm   LV E/e' medial:  21.3 LV IVS:        0.90 cm   LV e' lateral:   4.68 cm/s LVOT diam:     2.00 cm   LV E/e' lateral: 24.8 LV SV:         72 LV SV Index:   46 LVOT Area:     3.14 cm  RIGHT VENTRICLE             IVC RV S prime:     12.90 cm/s  IVC diam: 2.00 cm TAPSE (M-mode): 1.7 cm LEFT ATRIUM             Index        RIGHT ATRIUM           Index LA diam:        3.50 cm 2.22 cm/m   RA Area:     13.00 cm LA Vol (A2C):   27.8 ml 17.63 ml/m  RA Volume:   28.60 ml  18.14 ml/m LA Vol (A4C):   41.5 ml 26.32 ml/m LA Biplane Vol: 36.8 ml 23.34 ml/m  AORTIC VALVE LVOT Vmax:   87.70 cm/s LVOT Vmean:  56.200 cm/s LVOT VTI:    0.230 m  AORTA Ao Root diam: 2.10 cm Ao Asc diam:  3.00 cm MITRAL VALVE                 TRICUSPID VALVE MV Area (PHT): 5.84 cm     TR Peak grad:   36.0 mmHg MV Decel Time: 130 msec     TR Vmax:        300.00 cm/s MV E velocity: 116.00 cm/s MV A velocity: 80.20 cm/s   SHUNTS MV E/A ratio:  1.45         Systemic VTI:  0.23 m                             Systemic Diam: 2.00 cm Rachelle Hora Croitoru MD Electronically signed by Thurmon Fair MD Signature Date/Time: 12/29/2022/1:33:11 PM    Final    MR BRAIN WO CONTRAST Result Date: 12/29/2022 CLINICAL DATA:  Initial evaluation for acute neuro deficit, stroke suspected. EXAM: MRI HEAD WITHOUT CONTRAST MRA HEAD WITHOUT CONTRAST MRA NECK WITHOUT CONTRAST TECHNIQUE: Multiplanar, multiecho pulse sequences of the brain and surrounding structures were obtained without intravenous contrast. Angiographic images of the Circle of Willis were obtained using MRA technique without intravenous contrast. Angiographic images of the neck were obtained using MRA technique without intravenous contrast. Carotid stenosis measurements (when applicable) are obtained utilizing NASCET criteria, using the distal internal carotid diameter as the denominator. COMPARISON:  CT from earlier the same day. FINDINGS: MRI HEAD FINDINGS Brain: Cerebral volume within normal limits. Patchy and confluent T2/FLAIR hyperintensity involving the periventricular and deep white matter both cerebral hemispheres, most like related chronic microvascular ischemic disease, moderately advanced in nature. Encephalomalacia and gliosis with associated chronic hemosiderin staining noted at the right temporoccipital junction. Few additional scattered foci of chronic hemosiderin staining noted about the left occipital and right frontal lobes, suggesting prior hemorrhage at these locations. Few scattered superimposed chronic micro hemorrhages, most likely hypertensive in nature. No abnormal foci of restricted diffusion to suggest acute or subacute ischemia. Gray-white matter differentiation maintained. No acute  intracranial hemorrhage. No mass lesion, mass effect or midline shift. Mild ex vacuo dilatation of  the right lateral ventricle without hydrocephalus. No extra-axial fluid collection. Pituitary gland and suprasellar region within normal limits. Vascular: Major intracranial vascular flow voids are maintained. Skull and upper cervical spine: Craniocervical junction with level limits. Bone marrow signal intensity normal. No scalp soft tissue abnormality. Sinuses/Orbits: Prior bilateral ocular lens replacement. Mild mucosal thickening noted about the paranasal sinuses. No air-fluid levels. Trace left mastoid effusion, of doubtful significance. Other: None. MRA HEAD FINDINGS ANTERIOR CIRCULATION: Atheromatous change about the left carotid siphon with associated short-segment moderate to severe stenosis at the proximal cavernous segment (series 1, image 84). No stenosis about the contralateral right siphon. A1 segments patent bilaterally. Normal anterior communicating artery complex. Anterior cerebral arteries patent without stenosis. No M1 stenosis or occlusion. Distal MCA branches perfused and symmetric. POSTERIOR CIRCULATION: Both V4 segments patent without stenosis. Both PICA patent. Basilar patent without stenosis. Superior cerebellar and posterior cerebral arteries patent bilaterally without significant stenosis. Anatomic variants: None significant.  No intracranial aneurysm. MRA NECK FINDINGS AORTIC ARCH: Partially visualized aortic arch within normal limits for caliber with standard branch pattern. No visible stenosis about the origin of the great vessels. RIGHT CAROTID SYSTEM: Right common and internal carotid arteries are patent with antegrade flow. No evidence for dissection. Mild atheromatous irregularity about the right carotid bulb without hemodynamically significant greater than 50% stenosis. LEFT CAROTID SYSTEM: Left common and internal carotid arteries are patent with antegrade flow. No evidence for  dissection. Atheromatous irregularity about the left carotid bulb without hemodynamically significant greater than 50% stenosis. VERTEBRAL ARTERIES: Both vertebral arteries arise from the subclavian arteries. Proximal left vertebral artery tortuous and intermittently not seen, which could be related to severe stenoses and/or possible proximal occlusion. Left vertebral artery patent with antegrade flow extending from the distal left V1 segment to the skull base. Right vertebral artery patent without stenosis. No visible evidence for dissection. Other: None. IMPRESSION: MRI HEAD: 1. No acute intracranial abnormality. 2. 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 Few additional scattered foci of chronic hemosiderin staining about the left occipital and right frontal lobes, suggesting prior hemorrhage at these locations. 3. Underlying moderate chronic microvascular ischemic disease. MRA HEAD: 1. Negative intracranial MRA for large vessel occlusion. 2. Atheromatous change about the left carotid siphon with associated short-segment moderate to severe stenosis at the proximal cavernous segment. 3. Otherwise wide patency of the major intracranial arterial vasculature. MRA NECK: 1. Atheromatous change about the carotid bifurcations without hemodynamically significant greater than 50% stenosis. 2. Proximal left vertebral artery tortuous and intermittently not seen, which could be related to underlying stenoses and/or possible proximal occlusion. Left vertebral artery otherwise patent extending from the distal left V1 segment to the skull base. 3. Wide patency of the right vertebral artery without stenosis. Electronically Signed   By: Rise Mu M.D.   On: 12/29/2022 06:01   MR ANGIO HEAD WO CONTRAST Result Date: 12/29/2022 CLINICAL DATA:  Initial evaluation for acute neuro deficit, stroke suspected. EXAM: MRI HEAD WITHOUT CONTRAST MRA HEAD WITHOUT  CONTRAST MRA NECK WITHOUT CONTRAST TECHNIQUE: Multiplanar, multiecho pulse sequences of the brain and surrounding structures were obtained without intravenous contrast. Angiographic images of the Circle of Willis were obtained using MRA technique without intravenous contrast. Angiographic images of the neck were obtained using MRA technique without intravenous contrast. Carotid stenosis measurements (when applicable) are obtained utilizing NASCET criteria, using the distal internal carotid diameter as the denominator. COMPARISON:  CT from earlier the same day. FINDINGS: MRI HEAD FINDINGS Brain:  Cerebral volume within normal limits. Patchy and confluent T2/FLAIR hyperintensity involving the periventricular and deep white matter both cerebral hemispheres, most like related chronic microvascular ischemic disease, moderately advanced in nature. Encephalomalacia and gliosis with associated chronic hemosiderin staining noted at the right temporoccipital junction. Few additional scattered foci of chronic hemosiderin staining noted about the left occipital and right frontal lobes, suggesting prior hemorrhage at these locations. Few scattered superimposed chronic micro hemorrhages, most likely hypertensive in nature. No abnormal foci of restricted diffusion to suggest acute or subacute ischemia. Gray-white matter differentiation maintained. No acute intracranial hemorrhage. No mass lesion, mass effect or midline shift. Mild ex vacuo dilatation of the right lateral ventricle without hydrocephalus. No extra-axial fluid collection. Pituitary gland and suprasellar region within normal limits. Vascular: Major intracranial vascular flow voids are maintained. Skull and upper cervical spine: Craniocervical junction with level limits. Bone marrow signal intensity normal. No scalp soft tissue abnormality. Sinuses/Orbits: Prior bilateral ocular lens replacement. Mild mucosal thickening noted about the paranasal sinuses. No air-fluid  levels. Trace left mastoid effusion, of doubtful significance. Other: None. MRA HEAD FINDINGS ANTERIOR CIRCULATION: Atheromatous change about the left carotid siphon with associated short-segment moderate to severe stenosis at the proximal cavernous segment (series 1, image 84). No stenosis about the contralateral right siphon. A1 segments patent bilaterally. Normal anterior communicating artery complex. Anterior cerebral arteries patent without stenosis. No M1 stenosis or occlusion. Distal MCA branches perfused and symmetric. POSTERIOR CIRCULATION: Both V4 segments patent without stenosis. Both PICA patent. Basilar patent without stenosis. Superior cerebellar and posterior cerebral arteries patent bilaterally without significant stenosis. Anatomic variants: None significant.  No intracranial aneurysm. MRA NECK FINDINGS AORTIC ARCH: Partially visualized aortic arch within normal limits for caliber with standard branch pattern. No visible stenosis about the origin of the great vessels. RIGHT CAROTID SYSTEM: Right common and internal carotid arteries are patent with antegrade flow. No evidence for dissection. Mild atheromatous irregularity about the right carotid bulb without hemodynamically significant greater than 50% stenosis. LEFT CAROTID SYSTEM: Left common and internal carotid arteries are patent with antegrade flow. No evidence for dissection. Atheromatous irregularity about the left carotid bulb without hemodynamically significant greater than 50% stenosis. VERTEBRAL ARTERIES: Both vertebral arteries arise from the subclavian arteries. Proximal left vertebral artery tortuous and intermittently not seen, which could be related to severe stenoses and/or possible proximal occlusion. Left vertebral artery patent with antegrade flow extending from the distal left V1 segment to the skull base. Right vertebral artery patent without stenosis. No visible evidence for dissection. Other: None. IMPRESSION: MRI HEAD: 1. No  acute intracranial abnormality. 2. 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 Few additional scattered foci of chronic hemosiderin staining about the left occipital and right frontal lobes, suggesting prior hemorrhage at these locations. 3. Underlying moderate chronic microvascular ischemic disease. MRA HEAD: 1. Negative intracranial MRA for large vessel occlusion. 2. Atheromatous change about the left carotid siphon with associated short-segment moderate to severe stenosis at the proximal cavernous segment. 3. Otherwise wide patency of the major intracranial arterial vasculature. MRA NECK: 1. Atheromatous change about the carotid bifurcations without hemodynamically significant greater than 50% stenosis. 2. Proximal left vertebral artery tortuous and intermittently not seen, which could be related to underlying stenoses and/or possible proximal occlusion. Left vertebral artery otherwise patent extending from the distal left V1 segment to the skull base. 3. Wide patency of the right vertebral artery without stenosis. Electronically Signed   By: Rise Mu M.D.   On:  12/29/2022 06:01   MR ANGIO NECK WO CONTRAST Result Date: 12/29/2022 CLINICAL DATA:  Initial evaluation for acute neuro deficit, stroke suspected. EXAM: MRI HEAD WITHOUT CONTRAST MRA HEAD WITHOUT CONTRAST MRA NECK WITHOUT CONTRAST TECHNIQUE: Multiplanar, multiecho pulse sequences of the brain and surrounding structures were obtained without intravenous contrast. Angiographic images of the Circle of Willis were obtained using MRA technique without intravenous contrast. Angiographic images of the neck were obtained using MRA technique without intravenous contrast. Carotid stenosis measurements (when applicable) are obtained utilizing NASCET criteria, using the distal internal carotid diameter as the denominator. COMPARISON:  CT from earlier the same day. FINDINGS: MRI HEAD FINDINGS  Brain: Cerebral volume within normal limits. Patchy and confluent T2/FLAIR hyperintensity involving the periventricular and deep white matter both cerebral hemispheres, most like related chronic microvascular ischemic disease, moderately advanced in nature. Encephalomalacia and gliosis with associated chronic hemosiderin staining noted at the right temporoccipital junction. Few additional scattered foci of chronic hemosiderin staining noted about the left occipital and right frontal lobes, suggesting prior hemorrhage at these locations. Few scattered superimposed chronic micro hemorrhages, most likely hypertensive in nature. No abnormal foci of restricted diffusion to suggest acute or subacute ischemia. Gray-white matter differentiation maintained. No acute intracranial hemorrhage. No mass lesion, mass effect or midline shift. Mild ex vacuo dilatation of the right lateral ventricle without hydrocephalus. No extra-axial fluid collection. Pituitary gland and suprasellar region within normal limits. Vascular: Major intracranial vascular flow voids are maintained. Skull and upper cervical spine: Craniocervical junction with level limits. Bone marrow signal intensity normal. No scalp soft tissue abnormality. Sinuses/Orbits: Prior bilateral ocular lens replacement. Mild mucosal thickening noted about the paranasal sinuses. No air-fluid levels. Trace left mastoid effusion, of doubtful significance. Other: None. MRA HEAD FINDINGS ANTERIOR CIRCULATION: Atheromatous change about the left carotid siphon with associated short-segment moderate to severe stenosis at the proximal cavernous segment (series 1, image 84). No stenosis about the contralateral right siphon. A1 segments patent bilaterally. Normal anterior communicating artery complex. Anterior cerebral arteries patent without stenosis. No M1 stenosis or occlusion. Distal MCA branches perfused and symmetric. POSTERIOR CIRCULATION: Both V4 segments patent without stenosis.  Both PICA patent. Basilar patent without stenosis. Superior cerebellar and posterior cerebral arteries patent bilaterally without significant stenosis. Anatomic variants: None significant.  No intracranial aneurysm. MRA NECK FINDINGS AORTIC ARCH: Partially visualized aortic arch within normal limits for caliber with standard branch pattern. No visible stenosis about the origin of the great vessels. RIGHT CAROTID SYSTEM: Right common and internal carotid arteries are patent with antegrade flow. No evidence for dissection. Mild atheromatous irregularity about the right carotid bulb without hemodynamically significant greater than 50% stenosis. LEFT CAROTID SYSTEM: Left common and internal carotid arteries are patent with antegrade flow. No evidence for dissection. Atheromatous irregularity about the left carotid bulb without hemodynamically significant greater than 50% stenosis. VERTEBRAL ARTERIES: Both vertebral arteries arise from the subclavian arteries. Proximal left vertebral artery tortuous and intermittently not seen, which could be related to severe stenoses and/or possible proximal occlusion. Left vertebral artery patent with antegrade flow extending from the distal left V1 segment to the skull base. Right vertebral artery patent without stenosis. No visible evidence for dissection. Other: None. IMPRESSION: MRI HEAD: 1. No acute intracranial abnormality. 2. 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 Few additional scattered foci of chronic hemosiderin staining about the left occipital and right frontal lobes, suggesting prior hemorrhage at these locations. 3. Underlying moderate chronic microvascular ischemic disease. MRA HEAD: 1.  Negative intracranial MRA for large vessel occlusion. 2. Atheromatous change about the left carotid siphon with associated short-segment moderate to severe stenosis at the proximal cavernous segment. 3. Otherwise wide  patency of the major intracranial arterial vasculature. MRA NECK: 1. Atheromatous change about the carotid bifurcations without hemodynamically significant greater than 50% stenosis. 2. Proximal left vertebral artery tortuous and intermittently not seen, which could be related to underlying stenoses and/or possible proximal occlusion. Left vertebral artery otherwise patent extending from the distal left V1 segment to the skull base. 3. Wide patency of the right vertebral artery without stenosis. Electronically Signed   By: Rise Mu M.D.   On: 12/29/2022 06:01   CT HEAD CODE STROKE WO CONTRAST Result Date: 12/28/2022 CLINICAL DATA:  Code stroke. Initial evaluation for acute neuro deficit, stroke. EXAM: CT HEAD WITHOUT CONTRAST TECHNIQUE: Contiguous axial images were obtained from the base of the skull through the vertex without intravenous contrast. RADIATION DOSE REDUCTION: This exam was performed according to the departmental dose-optimization program which includes automated exposure control, adjustment of the mA and/or kV according to patient size and/or use of iterative reconstruction technique. COMPARISON:  Prior study from 01/11/2022. FINDINGS: Brain: Cerebral volume within normal limits. Moderate chronic microvascular ischemic disease. Chronic encephalomalacia at the right occipital lobe, stable. No acute intracranial hemorrhage. No visible acute large vessel territory infarct. No mass lesion or midline shift. No hydrocephalus or extra-axial fluid collection. Vascular: No abnormal hyperdense vessel. Scattered vascular calcifications noted within the carotid siphons. Skull: Scalp soft tissues within normal limits.  Calvarium intact. Sinuses/Orbits: Globes and orbital soft tissues within normal limits. Paranasal sinuses are largely clear. No significant mastoid effusion. Other: None. ASPECTS [facility] Stroke Program Early CT Score) - Ganglionic level infarction (caudate, lentiform nuclei, internal  capsule, insula, M1-M3 cortex): 7 - Supraganglionic infarction (M4-M6 cortex): 3 Total score (0-10 with 10 being normal): 10 IMPRESSION: 1. No acute intracranial abnormality. 2. ASPECTS is 10. 3. Moderate chronic microvascular ischemic disease with chronic encephalomalacia at the right occipital lobe, stable. These results were communicated to Dr. Otelia Limes at 8:12 pm on 12/28/2022 by text page via the [facility] messaging system. Electronically Signed   By: Rise Mu M.D.   On: 12/28/2022 20:14   DG CHEST PORT 1 VIEW Result Date: 12/27/2022 CLINICAL DATA:  Respiratory distress. EXAM: PORTABLE CHEST 1 VIEW COMPARISON:  12/23/2022 FINDINGS: Low volume film. Cardiopericardial silhouette is at upper limits of normal for size. Diffuse interstitial lung disease again noted with bibasilar atelectasis or infiltrate. No substantial pleural effusion. No acute bony abnormality. IMPRESSION: Low volume film with diffuse interstitial lung disease and bibasilar atelectasis or infiltrate. Electronically Signed   By: Kennith Center M.D.   On: 12/27/2022 08:53   DG Chest Port 1 View Result Date: 12/23/2022 CLINICAL DATA:  Shortness of breath EXAM: PORTABLE CHEST 1 VIEW COMPARISON:  07/16/2022 FINDINGS: Slightly shallow inspiration. Mild cardiac enlargement. No vascular congestion. Peribronchial thickening with coarse interstitial changes in the perihilar region likely representing chronic bronchitis. No airspace disease or consolidation. No pleural effusions. No pneumothorax. Mediastinal contours appear intact. Degenerative changes in the spine. Postoperative changes in the cervical spine. IMPRESSION: Chronic bronchitic changes in the lungs. Mild cardiac enlargement. No focal consolidation. Electronically Signed   By: Burman Nieves M.D.   On: 12/23/2022 01:04   US RENAL Result Date: 12/20/2022 CLINICAL DATA:  Urinary retention.  Acute kidney injury. EXAM: RENAL / URINARY TRACT ULTRASOUND COMPLETE COMPARISON:  CT  12/13/2022 FINDINGS: Right Kidney: Renal measurements: 9.8 x  5.7 x 5.1 cm = volume: 149 mL. Echogenicity within normal limits. No mass or hydronephrosis visualized. Left Kidney: Renal measurements: 10.4 x 5.1 x 5.1 cm = volume: 140 mL. Echogenicity within normal limits. No mass or hydronephrosis visualized. Bladder: Appears normal for degree of bladder distention. Other: None. IMPRESSION: Normal ultrasound appearance of the kidneys. No mass or hydronephrosis identified. Electronically Signed   By: Burman Nieves M.D.   On: 12/20/2022 02:36   CT ABDOMEN PELVIS WO CONTRAST Result Date: 12/13/2022 CLINICAL DATA:  Abdominal pain, acute, nonlocalized. Lower abdominal pain. Diarrhea for 3 days. EXAM: CT ABDOMEN AND PELVIS WITHOUT CONTRAST TECHNIQUE: Multidetector CT imaging of the abdomen and pelvis was performed following the standard protocol without IV contrast. RADIATION DOSE REDUCTION: This exam was performed according to the departmental dose-optimization program which includes automated exposure control, adjustment of the mA and/or kV according to patient size and/or use of iterative reconstruction technique. COMPARISON:  None Available. FINDINGS: Lower chest: There are patchy areas of scarring/atelectasis in the visualized lung bases. No overt consolidation. No pleural effusion. The heart is normal in size. No pericardial effusion. Hepatobiliary: The liver is normal in size. Non-cirrhotic configuration. No suspicious mass. There is a subcentimeter sized hypoattenuating focus in the left hepatic lobe, segment 4A, which is too small to adequately characterize. No intrahepatic or extrahepatic bile duct dilation. No calcified gallstones. Focal fundal adenomyomatosis noted. Otherwise normal gallbladder wall thickness. No pericholecystic inflammatory changes. Pancreas: Unremarkable. No pancreatic ductal dilatation or surrounding inflammatory changes. Spleen: Within normal limits. No focal lesion. Adrenals/Urinary  Tract: Adrenal glands are unremarkable. No suspicious renal mass. No hydronephrosis. No renal or ureteric calculi. Unremarkable urinary bladder. Stomach/Bowel: No disproportionate dilation of the small or large bowel loops. The appendix was not visualized; however there is no acute inflammatory process in the right lower quadrant. There is mild-to-moderate circumferential thickening of the descending colon and proximal sigmoid colon with mild-to-moderate surrounding pericolonic fat stranding and prominence of vasa recta, compatible with colitis. There is no pneumatosis, portal venous gas, pneumoperitoneum or walled-off abscess/loculated collection. Vascular/Lymphatic: No ascites or pneumoperitoneum. No abdominal or pelvic lymphadenopathy, by size criteria. No aneurysmal dilation of the major abdominal arteries. There are moderate peripheral atherosclerotic vascular calcifications of the aorta and its major branches. Reproductive: The uterus is surgically absent. No large adnexal mass. Other: The visualized soft tissues and abdominal wall are unremarkable. Musculoskeletal: No suspicious osseous lesions. There are moderate multilevel degenerative changes in the visualized spine. IMPRESSION: *Findings compatible with acute uncomplicated colitis involving the descending colon and proximal sigmoid colon. Differential diagnosis includes infectious, inflammatory or ischemic etiology, likely in that order. *Multiple other nonacute observations, as described above. Electronically Signed   By: Jules Schick M.D.   On: 12/13/2022 15:47    Assessment/Plan  Type 2 diabetes mellitus with chronic kidney disease, with long-term current use of insulin (HCC) Hospitalized 12/19/22-12/29/22 for acute rental failure, not on dialysis, renal US normal, AMS, f/u nephrology. Presented to ED with abd pain, N/V,  CT abd showed colitis descending colon and proximal sigmoid colon, treated with Cipro and Flagyl.  Bun/creat 28/3.14  12/29/22 Repeat CMP/eGFR on insulin, Glimepiride, Hgb A1c 8.2 11/30/22  Essential hypertension Blood pressure is controlled,  taking Metoprolol, Hydralazine, Prn Furosemide  Depression  taking Clonazepam, Citalopram, TSH 3.5 06/08/22  ILD (interstitial lung disease) (HCC) stable  ACID REFLUX DISEASE taking Famotidine  TIA (transient ischemic attack) On statin, Plavix, ASA  CAD (coronary artery disease)  Hx of CAD, stroke, on ASA, Plavix,  Statin, EF 60%  Dyslipidemia on Atorvastatin, LDL 37 12/29/22  Edema Prn Furosemide   Labs/tests ordered:  CBC/diff, CMP/eGFR   Next appt:  01/06/2023

## 2023-01-01 NOTE — Assessment & Plan Note (Signed)
on Atorvastatin, LDL 37 12/29/22

## 2023-01-01 NOTE — Assessment & Plan Note (Signed)
stable °

## 2023-01-04 ENCOUNTER — Telehealth: Payer: Medicare HMO | Admitting: Orthopedic Surgery

## 2023-01-04 ENCOUNTER — Telehealth: Payer: Medicare HMO | Admitting: Adult Health

## 2023-01-04 ENCOUNTER — Encounter: Payer: Self-pay | Admitting: Nurse Practitioner

## 2023-01-06 ENCOUNTER — Encounter: Payer: Medicare HMO | Admitting: Family

## 2023-01-19 ENCOUNTER — Ambulatory Visit (INDEPENDENT_AMBULATORY_CARE_PROVIDER_SITE_OTHER): Payer: Medicare HMO | Admitting: Family

## 2023-01-19 ENCOUNTER — Encounter: Payer: Self-pay | Admitting: Family

## 2023-01-19 VITALS — BP 118/62 | HR 91 | Temp 97.4°F | Resp 16 | Ht 60.0 in | Wt 112.2 lb

## 2023-01-19 DIAGNOSIS — M349 Systemic sclerosis, unspecified: Secondary | ICD-10-CM | POA: Diagnosis not present

## 2023-01-19 DIAGNOSIS — R634 Abnormal weight loss: Secondary | ICD-10-CM

## 2023-01-19 DIAGNOSIS — R197 Diarrhea, unspecified: Secondary | ICD-10-CM | POA: Diagnosis not present

## 2023-01-19 DIAGNOSIS — G40109 Localization-related (focal) (partial) symptomatic epilepsy and epileptic syndromes with simple partial seizures, not intractable, without status epilepticus: Secondary | ICD-10-CM | POA: Insufficient documentation

## 2023-01-19 DIAGNOSIS — I272 Pulmonary hypertension, unspecified: Secondary | ICD-10-CM

## 2023-01-19 MED ORDER — METRONIDAZOLE 500 MG PO TABS: 500.0000 mg | ORAL_TABLET | Freq: Three times a day (TID) | ORAL | 0 refills | Status: AC

## 2023-01-19 MED ORDER — CIPROFLOXACIN HCL 500 MG PO TABS: 500.0000 mg | ORAL_TABLET | Freq: Two times a day (BID) | ORAL | 0 refills | Status: AC

## 2023-01-19 NOTE — Progress Notes (Signed)
 Provider: Roxan Plough FNP-C  Aayansh Codispoti, Roxan BROCKS, NP  Patient Care Team: Soren Pigman, Roxan BROCKS, NP as PCP - General (Family Medicine) Shlomo Wilbert SAUNDERS, MD as PCP - Cardiology (Cardiology) Rosemarie Eather RAMAN, MD as Consulting Physician (Neurology) Cleatus Collar, MD as Consulting Physician (Ophthalmology) Aneita Gwendlyn DASEN, MD as Consulting Physician (Gastroenterology) Debera Jayson MATSU, MD as Consulting Physician (Cardiology) Sheree Rush, PA-C as Physician Assistant (Endocrinology) Gwenn Kent, MD (Inactive) as Consulting Physician (Rehabilitation) Rayburn Pac, MD as Consulting Physician (Nephrology) Joshua Blamer, MD as Consulting Physician (Dermatology)  Extended Emergency Contact Information Primary Emergency Contact: Jackquelyn Mini  [place] Home Phone: [phone] Relation: Daughter Secondary Emergency Contact: [facility] Phone: [phone] Relation: Son Interpreter needed? No  Code Status:  DNR Goals of care: Advanced Directive information    01/19/2023    2:18 PM  Advanced Directives  Does Patient Have a Medical Advance Directive? Yes  Type of Advance Directive Living will;Out of facility DNR (pink MOST or yellow form)  Does patient want to make changes to medical advance directive? No - Patient declined  Pre-existing out of facility DNR order (yellow form or pink MOST form) Yellow form placed in chart (order not valid for inpatient use)     Chief Complaint  Patient presents with   Acute Visit    Stomach colitis    HPI:  Pt is a 89 y.o. female seen today for an acute visit for evaluation of diarrhea for the past 4 days.Has no appetite for the past 4 days.tolerating fluids.she denies any fever,chills,nausea,vomiting or abdominal pain.Also denies any blood in the stool. Has been walking around and helping with stuff in the house but laid down yesterday did not do anything.  She was seen by hospice service yesterday states told them did  not want to continue with hospice states wants to go back to her PCP.  She is status post hospital admission from 12/19/2022  - 12/29/2022  after she presented to ED with abdominal pain and diarrhea.CT of the abdomen showed acute uncomplicated colitis involving descending and proximal sigmond colon.she was treated with IVF,Cipro  and Flagyl .   Discussed with patient and daughter referral to Gastroenterology but patient declined states was told that her kidneys were failing so no use of seeing a specialist.  She would like lab work done to check the Kidneys.   Past Medical History:  Diagnosis Date   Achilles tendon injury 11/27/2010   ALLERGIC RHINITIS 05/12/2006   Qualifier: Diagnosis of  By: Amon MD, Aloysius BRAVO.    Anxiety state 07/03/2009   Qualifier: Diagnosis of  By: Amon MD, Jose E.    Back pain 07/01/2010   Chronic kidney disease (CKD), stage III (moderate) (HCC) 05/31/2014   Chronic right SI joint pain 09/22/2013   Colitis 2024   DEGENERATIVE JOINT DISEASE, CERVICAL SPINE 06/23/2006   Annotation: had a CAT scan with a cervical myelogram that show  left C6-7 and  C5-6 foraminal stenosis Qualifier: Diagnosis of  By: Amon MD, Aloysius BRAVO.    DEPRESSION 05/12/2006   Qualifier: Diagnosis of  By: Amon MD, Jose E.    DIABETES MELLITUS, TYPE II 05/12/2006   Now following w/ Endo at [facility]     DIABETIC  RETINOPATHY 04/27/2007   Qualifier: Diagnosis of  By: Antonio ROSALEA Rockers     Encephalopathy, hypertensive    Essential hypertension 05/12/2006   Qualifier: Diagnosis of  By: Amon MD, Aloysius BRAVO.    GAIT DISTURBANCE 08/07/2009   Qualifier: Diagnosis of  ByBETHA Mech MD, Aloysius BRAVO.    GERD (gastroesophageal reflux disease)    History of colonoscopy    History of CT scan    History of mammogram    History of MRI    Hyperlipidemia associated with type 2 diabetes mellitus (HCC) 05/12/2006   Qualifier: Diagnosis of  By: Mech MD, Jose E.    ILD (interstitial lung disease) (HCC) 05/31/2015   Lobar cerebral  hemorrhage (HCC) 06/22/2019   NECK PAIN, CHRONIC 04/03/2008   Qualifier: Diagnosis of  By: Mech MD, Aloysius BRAVO.    Osteoarthritis 02/10/2016   Osteopenia    Osteoporosis 06/23/2006   Annotation: had a bone density test in 08-2004.  T score was -2.4 Qualifier: Diagnosis of  By: Mech MD, Aloysius DRAFTS     SAH (subarachnoid hemorrhage) (HCC)    Scleroderma (HCC) 02/28/2016   Stroke (HCC)    Takotsubo cardiomyopathy    s/p stress MI with stress induced CM with normal coronary arteries by cath 2007 with normalization of LVF by echo 04/2005   TIA (transient ischemic attack)    Vasculitis of skin 09/28/2012   Vitamin D  deficiency 10/01/2014   Past Surgical History:  Procedure Laterality Date   ABDOMINAL HYSTERECTOMY  1977   no oophorectomy   APPENDECTOMY     CARDIAC CATHETERIZATION     CATARACT EXTRACTION, BILATERAL  2011   SPINAL FUSION  2000   Dr. Unice   TONSILLECTOMY      Allergies  Allergen Reactions   Cephalexin  Nausea And Vomiting    Pt stated made severely sick, will never take again   Pioglitazone Swelling    REACTION: EDEMA   Nintedanib Diarrhea   Sertraline  Nausea And Vomiting    Outpatient Encounter Medications as of 01/19/2023  Medication Sig   acetaminophen  (TYLENOL ) 500 MG tablet Take 500 mg by mouth every 6 (six) hours as needed for mild pain or headache.   aspirin  EC 81 MG tablet Take 81 mg by mouth daily. Swallow whole.   atorvastatin  (LIPITOR ) 80 MG tablet TAKE 1 TABLET EVERY DAY (Patient taking differently: Take 80 mg by mouth every evening.)   ciprofloxacin  (CIPRO ) 500 MG tablet Take 1 tablet (500 mg total) by mouth 2 (two) times daily for 7 days.   citalopram  (CELEXA ) 10 MG tablet TAKE 1 TABLET BY MOUTH EVERY DAY   clonazePAM  (KLONOPIN ) 0.5 MG tablet Take 1 tablet (0.5 mg total) by mouth 2 (two) times daily as needed for anxiety.   clopidogrel  (PLAVIX ) 75 MG tablet Take 1 tablet (75 mg total) by mouth daily.   famotidine  (PEPCID ) 20 MG tablet Take 2 tablets (40 mg  total) by mouth daily.   furosemide  (LASIX ) 20 MG tablet Take 1 tablet (20 mg total) by mouth daily as needed. Take only if weight is +3 lbs greater than baseline weight in 1 day or +5 lbs in 1 week   hydrALAZINE  (APRESOLINE ) 25 MG tablet Take 1 tablet (25 mg total) by mouth 3 (three) times daily.   ipratropium-albuterol  (DUONEB) 0.5-2.5 (3) MG/3ML SOLN Take 3 mLs by nebulization every 6 (six) hours as needed.   metoprolol  succinate (TOPROL -XL) 50 MG 24 hr tablet Take 1 tablet (50 mg total) by mouth daily. Take with or immediately following a meal.   metroNIDAZOLE  (FLAGYL ) 500 MG tablet Take 1 tablet (500 mg total) by mouth 3 (three) times daily for 7 days.   NOVOLOG  FLEXPEN 100 UNIT/ML FlexPen Inject 6-8 Units into the skin 3 (three) times daily before meals.  Pt completes sliding scale.   sodium bicarbonate  650 MG tablet Take 2 tablets (1,300 mg total) by mouth 2 (two) times daily.   cyanocobalamin  1000 MCG tablet Take 1 tablet (1,000 mcg total) by mouth daily. (Patient not taking: Reported on 01/19/2023)   No facility-administered encounter medications on file as of 01/19/2023.    Review of Systems  Constitutional:  Negative for appetite change, chills, fatigue, fever and unexpected weight change.  HENT:  Negative for congestion, ear discharge, ear pain, hearing loss, nosebleeds, postnasal drip, rhinorrhea, sinus pressure, sinus pain, sneezing, sore throat, tinnitus and trouble swallowing.   Eyes:  Negative for pain, discharge, redness, itching and visual disturbance.  Respiratory:  Negative for cough, chest tightness, shortness of breath and wheezing.   Cardiovascular:  Negative for chest pain, palpitations and leg swelling.  Gastrointestinal:  Positive for diarrhea. Negative for abdominal distention, abdominal pain, blood in stool, constipation, nausea and vomiting.  Genitourinary:  Negative for difficulty urinating, dysuria, flank pain, frequency and urgency.  Musculoskeletal:  Negative for  arthralgias, back pain, gait problem, joint swelling and myalgias.  Skin:  Negative for color change, pallor and rash.  Neurological:  Negative for dizziness, weakness, light-headedness and headaches.  Psychiatric/Behavioral:  Negative for agitation, behavioral problems, confusion, hallucinations and sleep disturbance. The patient is not nervous/anxious.     Immunization History  Administered Date(s) Administered   Fluad Quad(high Dose 65+) 10/20/2018   Fluad Trivalent(High Dose 65+) 11/30/2022   H1N1 01/05/2008   Influenza Split 10/22/2009, 12/09/2010, 10/24/2013, 10/01/2014, 09/14/2016   Influenza Whole 12/27/2006, 10/18/2007, 01/05/2008, 12/19/2008, 10/22/2009   Influenza, High Dose Seasonal PF 12/16/2015, 10/27/2017, 10/20/2018   Influenza,inj,Quad PF,6+ Mos 10/24/2013, 10/01/2014, 09/14/2016   Influenza,inj,quad, With Preservative 11/23/2016   Influenza-Unspecified 12/27/2006, 01/05/2008, 12/19/2008, 12/09/2010, 11/30/2011, 10/19/2012, 10/20/2018, 10/03/2019, 10/08/2020, 09/30/2021   Novel Infuenza-h1n1-09 01/05/2008   PFIZER(Purple Top)SARS-COV-2 Vaccination 05/01/2019, 05/22/2019, 11/28/2019   Pneumococcal Conjugate-13 11/02/2013   Pneumococcal Polysaccharide-23 11/19/2009   Td 06/18/2008   Td (Adult),5 Lf Tetanus Toxid, Preservative Free 06/18/2008   Zoster, Live 05/03/2007, 11/03/2007   Pertinent  Health Maintenance Due  Topic Date Due   OPHTHALMOLOGY EXAM  07/29/2017   HEMOGLOBIN A1C  05/30/2023   FOOT EXAM  06/02/2023   INFLUENZA VACCINE  Completed   DEXA SCAN  Completed   MAMMOGRAM  Discontinued      06/02/2022    1:38 PM 08/21/2022    3:19 PM 09/01/2022    1:50 PM 01/01/2023    1:37 PM 01/19/2023    2:16 PM  Fall Risk  Falls in the past year? 0 0 0 0 1  Was there an injury with Fall? 0 0 0 0 0  Fall Risk Category Calculator 0 0 0 0 2  Patient at Risk for Falls Due to No Fall Risks No Fall Risks No Fall Risks No Fall Risks   Fall risk Follow up Falls evaluation  completed Falls evaluation completed Falls evaluation completed Education provided    Functional Status Survey:    Vitals:   01/19/23 1425  BP: 118/62  Pulse: 91  Resp: 16  Temp: (!) 97.4 F (36.3 C)  SpO2: 99%  Weight: 112 lb 3.2 oz (50.9 kg)  Height: 5' (1.524 m)   Body mass index is 21.91 kg/m. Physical Exam Vitals reviewed.  Constitutional:      General: She is not in acute distress.    Appearance: Normal appearance. She is normal weight. She is not ill-appearing or diaphoretic.  HENT:  Head: Normocephalic.     Right Ear: Tympanic membrane, ear canal and external ear normal. There is no impacted cerumen.     Left Ear: Tympanic membrane, ear canal and external ear normal. There is no impacted cerumen.     Nose: Nose normal. No congestion or rhinorrhea.     Mouth/Throat:     Mouth: Mucous membranes are moist.     Pharynx: Oropharynx is clear. No oropharyngeal exudate or posterior oropharyngeal erythema.  Eyes:     General: No scleral icterus.       Right eye: No discharge.        Left eye: No discharge.     Extraocular Movements: Extraocular movements intact.     Conjunctiva/sclera: Conjunctivae normal.     Pupils: Pupils are equal, round, and reactive to light.  Neck:     Vascular: No carotid bruit.  Cardiovascular:     Rate and Rhythm: Normal rate and regular rhythm.     Pulses: Normal pulses.     Heart sounds: Normal heart sounds. No murmur heard.    No friction rub. No gallop.  Pulmonary:     Effort: Pulmonary effort is normal. No respiratory distress.     Breath sounds: Normal breath sounds. No wheezing, rhonchi or rales.  Chest:     Chest wall: No tenderness.  Abdominal:     General: Bowel sounds are normal. There is no distension.     Palpations: Abdomen is soft. There is no mass.     Tenderness: There is no abdominal tenderness. There is no right CVA tenderness, left CVA tenderness, guarding or rebound.  Musculoskeletal:        General: No swelling  or tenderness. Normal range of motion.     Cervical back: Normal range of motion. No rigidity or tenderness.     Right lower leg: No edema.     Left lower leg: No edema.  Lymphadenopathy:     Cervical: No cervical adenopathy.  Skin:    General: Skin is warm and dry.     Coloration: Skin is not pale.     Findings: No bruising, erythema, lesion or rash.  Neurological:     Mental Status: She is alert and oriented to person, place, and time.     Cranial Nerves: No cranial nerve deficit.     Sensory: No sensory deficit.     Motor: No weakness.     Coordination: Coordination normal.     Gait: Gait normal.  Psychiatric:        Mood and Affect: Mood normal.        Speech: Speech normal.        Behavior: Behavior normal.        Thought Content: Thought content normal.        Judgment: Judgment normal.     Labs reviewed: Recent Labs    12/20/22 0530 12/21/22 0750 12/28/22 0704 12/29/22 0452 01/01/23 1418  NA 141   < > 140 141 139  K 4.3   < > 4.0 3.8 3.8  CL 119*   < > 106 104 101  CO2 16*   < > 25 27 28   GLUCOSE 177*   < > 153* 132* 202*  BUN 53*   < > 29* 28* 35*  CREATININE 4.21*   < > 3.42* 3.14* 3.52*  CALCIUM  7.9*   < > 7.9* 7.9* 8.4*  MG  --   --  1.3*  --   --  PHOS 5.7*  --   --   --   --    < > = values in this interval not displayed.   Recent Labs    12/13/22 1323 12/20/22 0530 12/28/22 0704 12/29/22 0452 01/01/23 1418  AST 20  --  18 18 14   ALT 15  --  14 13 10   ALKPHOS 73  --  52 51  --   BILITOT 0.6  --  0.5 0.5 0.5  PROT 6.4*  --  5.5* 5.5* 6.3  ALBUMIN 3.2* 2.7* 2.7* 2.6*  --    Recent Labs    12/22/22 0606 12/23/22 0654 12/24/22 0615 12/26/22 0536 12/27/22 0725 01/01/23 1418  WBC 7.2 7.1   < > 7.6 7.7 7.0  NEUTROABS 5.3 4.6  --   --   --  4,270  HGB 8.0* 8.3*   < > 7.5* 7.8* 7.8*  HCT 24.0* 25.0*   < > 22.6* 24.1* 24.6*  MCV 98.4 97.7   < > 97.4 98.4 100.4*  PLT 226 146*   < > 208 242 297   < > = values in this interval not displayed.    Lab Results  Component Value Date   TSH 3.50 06/08/2022   Lab Results  Component Value Date   HGBA1C 8.2 (H) 11/30/2022   Lab Results  Component Value Date   CHOL 95 12/29/2022   HDL 35 (L) 12/29/2022   LDLCALC 37 12/29/2022   LDLDIRECT 110.0 10/13/2016   TRIG 116 12/29/2022   CHOLHDL 2.7 12/29/2022    Significant Diagnostic Results in last 30 days:  ECHOCARDIOGRAM COMPLETE Result Date: 12/29/2022    ECHOCARDIOGRAM REPORT   Patient Name:   HAYLEE MCANANY Date of Exam: 12/29/2022 Medical Rec #:  [id number]     Height:       60.0 in Accession #:    [id number]    Weight:       134.5 lb Date of Birth:  1933/12/17    BSA:          1.577 m Patient Age:    88 years      BP:           158/61 mmHg Patient Gender: F             HR:           64 bpm. Exam Location:  Inpatient Procedure: 2D Echo, Cardiac Doppler and Color Doppler Indications:    TIA G45.9  History:        Patient has prior history of Echocardiogram examinations, most                 recent 01/05/2022. TIA and Stroke; Risk Factors:Hypertension,                 Diabetes and Dyslipidemia.  Sonographer:    Tinnie Gosling RDCS Referring Phys: [phone] SUBRINA SUNDIL IMPRESSIONS  1. Left ventricular ejection fraction, by estimation, is 60 to 65%. The left ventricle has normal function. The left ventricle has no regional wall motion abnormalities. Left ventricular diastolic parameters are consistent with Grade II diastolic dysfunction (pseudonormalization). Elevated left atrial pressure.  2. Right ventricular systolic function is normal. The right ventricular size is normal. There is mildly elevated pulmonary artery systolic pressure. The estimated right ventricular systolic pressure is 39.0 mmHg.  3. The mitral valve is normal in structure. Trivial mitral valve regurgitation. No evidence of mitral stenosis.  4. The aortic valve is tricuspid. There is mild calcification  of the aortic valve. Aortic valve regurgitation is trivial. Aortic valve  sclerosis/calcification is present, without any evidence of aortic stenosis.  5. The inferior vena cava is normal in size with greater than 50% respiratory variability, suggesting right atrial pressure of 3 mmHg. Comparison(s): Prior images reviewed side by side. The left ventricular diastolic function is significantly worse. FINDINGS  Left Ventricle: Left ventricular ejection fraction, by estimation, is 60 to 65%. The left ventricle has normal function. The left ventricle has no regional wall motion abnormalities. The left ventricular internal cavity size was normal in size. There is  no left ventricular hypertrophy. Left ventricular diastolic parameters are consistent with Grade II diastolic dysfunction (pseudonormalization). Elevated left atrial pressure. Right Ventricle: The right ventricular size is normal. No increase in right ventricular wall thickness. Right ventricular systolic function is normal. There is mildly elevated pulmonary artery systolic pressure. The tricuspid regurgitant velocity is 3.00  m/s, and with an assumed right atrial pressure of 3 mmHg, the estimated right ventricular systolic pressure is 39.0 mmHg. Left Atrium: Left atrial size was normal in size. Right Atrium: Right atrial size was normal in size. Pericardium: There is no evidence of pericardial effusion. Mitral Valve: The mitral valve is normal in structure. Trivial mitral valve regurgitation. No evidence of mitral valve stenosis. Tricuspid Valve: The tricuspid valve is normal in structure. Tricuspid valve regurgitation is not demonstrated. No evidence of tricuspid stenosis. Aortic Valve: The aortic valve is tricuspid. There is mild calcification of the aortic valve. Aortic valve regurgitation is trivial. Aortic valve sclerosis/calcification is present, without any evidence of aortic stenosis. Pulmonic Valve: The pulmonic valve was normal in structure. Pulmonic valve regurgitation is not visualized. No evidence of pulmonic stenosis.  Aorta: The aortic root and ascending aorta are structurally normal, with no evidence of dilitation. Venous: The inferior vena cava is normal in size with greater than 50% respiratory variability, suggesting right atrial pressure of 3 mmHg. IAS/Shunts: No atrial level shunt detected by color flow Doppler.  LEFT VENTRICLE PLAX 2D LVIDd:         4.20 cm   Diastology LVIDs:         2.40 cm   LV e' medial:    5.44 cm/s LV PW:         0.90 cm   LV E/e' medial:  21.3 LV IVS:        0.90 cm   LV e' lateral:   4.68 cm/s LVOT diam:     2.00 cm   LV E/e' lateral: 24.8 LV SV:         72 LV SV Index:   46 LVOT Area:     3.14 cm  RIGHT VENTRICLE             IVC RV S prime:     12.90 cm/s  IVC diam: 2.00 cm TAPSE (M-mode): 1.7 cm LEFT ATRIUM             Index        RIGHT ATRIUM           Index LA diam:        3.50 cm 2.22 cm/m   RA Area:     13.00 cm LA Vol (A2C):   27.8 ml 17.63 ml/m  RA Volume:   28.60 ml  18.14 ml/m LA Vol (A4C):   41.5 ml 26.32 ml/m LA Biplane Vol: 36.8 ml 23.34 ml/m  AORTIC VALVE LVOT Vmax:   87.70 cm/s LVOT Vmean:  56.200 cm/s  LVOT VTI:    0.230 m  AORTA Ao Root diam: 2.10 cm Ao Asc diam:  3.00 cm MITRAL VALVE                TRICUSPID VALVE MV Area (PHT): 5.84 cm     TR Peak grad:   36.0 mmHg MV Decel Time: 130 msec     TR Vmax:        300.00 cm/s MV E velocity: 116.00 cm/s MV A velocity: 80.20 cm/s   SHUNTS MV E/A ratio:  1.45         Systemic VTI:  0.23 m                             Systemic Diam: 2.00 cm Jerel Croitoru MD Electronically signed by Jerel Balding MD Signature Date/Time: 12/29/2022/1:33:11 PM    Final    MR BRAIN WO CONTRAST Result Date: 12/29/2022 CLINICAL DATA:  Initial evaluation for acute neuro deficit, stroke suspected. EXAM: MRI HEAD WITHOUT CONTRAST MRA HEAD WITHOUT CONTRAST MRA NECK WITHOUT CONTRAST TECHNIQUE: Multiplanar, multiecho pulse sequences of the brain and surrounding structures were obtained without intravenous contrast. Angiographic images of the Circle of  [place] were obtained using MRA technique without intravenous contrast. Angiographic images of the neck were obtained using MRA technique without intravenous contrast. Carotid stenosis measurements (when applicable) are obtained utilizing NASCET criteria, using the distal internal carotid diameter as the denominator. COMPARISON:  CT from earlier the same day. FINDINGS: MRI HEAD FINDINGS Brain: Cerebral volume within normal limits. Patchy and confluent T2/FLAIR hyperintensity involving the periventricular and deep white matter both cerebral hemispheres, most like related chronic microvascular ischemic disease, moderately advanced in nature. Encephalomalacia and gliosis with associated chronic hemosiderin staining noted at the right temporoccipital junction. Few additional scattered foci of chronic hemosiderin staining noted about the left occipital and right frontal lobes, suggesting prior hemorrhage at these locations. Few scattered superimposed chronic micro hemorrhages, most likely hypertensive in nature. No abnormal foci of restricted diffusion to suggest acute or subacute ischemia. Gray-white matter differentiation maintained. No acute intracranial hemorrhage. No mass lesion, mass effect or midline shift. Mild ex vacuo dilatation of the right lateral ventricle without hydrocephalus. No extra-axial fluid collection. Pituitary gland and suprasellar region within normal limits. Vascular: Major intracranial vascular flow voids are maintained. Skull and upper cervical spine: Craniocervical junction with level limits. Bone marrow signal intensity normal. No scalp soft tissue abnormality. Sinuses/Orbits: Prior bilateral ocular lens replacement. Mild mucosal thickening noted about the paranasal sinuses. No air-fluid levels. Trace left mastoid effusion, of doubtful significance. Other: None. MRA HEAD FINDINGS ANTERIOR CIRCULATION: Atheromatous change about the left carotid siphon with associated short-segment moderate to  severe stenosis at the proximal cavernous segment (series 1, image 84). No stenosis about the contralateral right siphon. A1 segments patent bilaterally. Normal anterior communicating artery complex. Anterior cerebral arteries patent without stenosis. No M1 stenosis or occlusion. Distal MCA branches perfused and symmetric. POSTERIOR CIRCULATION: Both V4 segments patent without stenosis. Both PICA patent. Basilar patent without stenosis. Superior cerebellar and posterior cerebral arteries patent bilaterally without significant stenosis. Anatomic variants: None significant.  No intracranial aneurysm. MRA NECK FINDINGS AORTIC ARCH: Partially visualized aortic arch within normal limits for caliber with standard branch pattern. No visible stenosis about the origin of the great vessels. RIGHT CAROTID SYSTEM: Right common and internal carotid arteries are patent with antegrade flow. No evidence for dissection. Mild atheromatous irregularity about the right carotid bulb  without hemodynamically significant greater than 50% stenosis. LEFT CAROTID SYSTEM: Left common and internal carotid arteries are patent with antegrade flow. No evidence for dissection. Atheromatous irregularity about the left carotid bulb without hemodynamically significant greater than 50% stenosis. VERTEBRAL ARTERIES: Both vertebral arteries arise from the subclavian arteries. Proximal left vertebral artery tortuous and intermittently not seen, which could be related to severe stenoses and/or possible proximal occlusion. Left vertebral artery patent with antegrade flow extending from the distal left V1 segment to the skull base. Right vertebral artery patent without stenosis. No visible evidence for dissection. Other: None. IMPRESSION: MRI HEAD: 1. No acute intracranial abnormality. 2. 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 Few additional scattered foci of chronic hemosiderin staining about  the left occipital and right frontal lobes, suggesting prior hemorrhage at these locations. 3. Underlying moderate chronic microvascular ischemic disease. MRA HEAD: 1. Negative intracranial MRA for large vessel occlusion. 2. Atheromatous change about the left carotid siphon with associated short-segment moderate to severe stenosis at the proximal cavernous segment. 3. Otherwise wide patency of the major intracranial arterial vasculature. MRA NECK: 1. Atheromatous change about the carotid bifurcations without hemodynamically significant greater than 50% stenosis. 2. Proximal left vertebral artery tortuous and intermittently not seen, which could be related to underlying stenoses and/or possible proximal occlusion. Left vertebral artery otherwise patent extending from the distal left V1 segment to the skull base. 3. Wide patency of the right vertebral artery without stenosis. Electronically Signed   By: Morene Hoard M.D.   On: 12/29/2022 06:01   MR ANGIO HEAD WO CONTRAST Result Date: 12/29/2022 CLINICAL DATA:  Initial evaluation for acute neuro deficit, stroke suspected. EXAM: MRI HEAD WITHOUT CONTRAST MRA HEAD WITHOUT CONTRAST MRA NECK WITHOUT CONTRAST TECHNIQUE: Multiplanar, multiecho pulse sequences of the brain and surrounding structures were obtained without intravenous contrast. Angiographic images of the Circle of Willis were obtained using MRA technique without intravenous contrast. Angiographic images of the neck were obtained using MRA technique without intravenous contrast. Carotid stenosis measurements (when applicable) are obtained utilizing NASCET criteria, using the distal internal carotid diameter as the denominator. COMPARISON:  CT from earlier the same day. FINDINGS: MRI HEAD FINDINGS Brain: Cerebral volume within normal limits. Patchy and confluent T2/FLAIR hyperintensity involving the periventricular and deep white matter both cerebral hemispheres, most like related chronic  microvascular ischemic disease, moderately advanced in nature. Encephalomalacia and gliosis with associated chronic hemosiderin staining noted at the right temporoccipital junction. Few additional scattered foci of chronic hemosiderin staining noted about the left occipital and right frontal lobes, suggesting prior hemorrhage at these locations. Few scattered superimposed chronic micro hemorrhages, most likely hypertensive in nature. No abnormal foci of restricted diffusion to suggest acute or subacute ischemia. Gray-white matter differentiation maintained. No acute intracranial hemorrhage. No mass lesion, mass effect or midline shift. Mild ex vacuo dilatation of the right lateral ventricle without hydrocephalus. No extra-axial fluid collection. Pituitary gland and suprasellar region within normal limits. Vascular: Major intracranial vascular flow voids are maintained. Skull and upper cervical spine: Craniocervical junction with level limits. Bone marrow signal intensity normal. No scalp soft tissue abnormality. Sinuses/Orbits: Prior bilateral ocular lens replacement. Mild mucosal thickening noted about the paranasal sinuses. No air-fluid levels. Trace left mastoid effusion, of doubtful significance. Other: None. MRA HEAD FINDINGS ANTERIOR CIRCULATION: Atheromatous change about the left carotid siphon with associated short-segment moderate to severe stenosis at the proximal cavernous segment (series 1, image 84). No stenosis about the contralateral right siphon. A1 segments  patent bilaterally. Normal anterior communicating artery complex. Anterior cerebral arteries patent without stenosis. No M1 stenosis or occlusion. Distal MCA branches perfused and symmetric. POSTERIOR CIRCULATION: Both V4 segments patent without stenosis. Both PICA patent. Basilar patent without stenosis. Superior cerebellar and posterior cerebral arteries patent bilaterally without significant stenosis. Anatomic variants: None significant.  No  intracranial aneurysm. MRA NECK FINDINGS AORTIC ARCH: Partially visualized aortic arch within normal limits for caliber with standard branch pattern. No visible stenosis about the origin of the great vessels. RIGHT CAROTID SYSTEM: Right common and internal carotid arteries are patent with antegrade flow. No evidence for dissection. Mild atheromatous irregularity about the right carotid bulb without hemodynamically significant greater than 50% stenosis. LEFT CAROTID SYSTEM: Left common and internal carotid arteries are patent with antegrade flow. No evidence for dissection. Atheromatous irregularity about the left carotid bulb without hemodynamically significant greater than 50% stenosis. VERTEBRAL ARTERIES: Both vertebral arteries arise from the subclavian arteries. Proximal left vertebral artery tortuous and intermittently not seen, which could be related to severe stenoses and/or possible proximal occlusion. Left vertebral artery patent with antegrade flow extending from the distal left V1 segment to the skull base. Right vertebral artery patent without stenosis. No visible evidence for dissection. Other: None. IMPRESSION: MRI HEAD: 1. No acute intracranial abnormality. 2. 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 Few additional scattered foci of chronic hemosiderin staining about the left occipital and right frontal lobes, suggesting prior hemorrhage at these locations. 3. Underlying moderate chronic microvascular ischemic disease. MRA HEAD: 1. Negative intracranial MRA for large vessel occlusion. 2. Atheromatous change about the left carotid siphon with associated short-segment moderate to severe stenosis at the proximal cavernous segment. 3. Otherwise wide patency of the major intracranial arterial vasculature. MRA NECK: 1. Atheromatous change about the carotid bifurcations without hemodynamically significant greater than 50% stenosis. 2. Proximal  left vertebral artery tortuous and intermittently not seen, which could be related to underlying stenoses and/or possible proximal occlusion. Left vertebral artery otherwise patent extending from the distal left V1 segment to the skull base. 3. Wide patency of the right vertebral artery without stenosis. Electronically Signed   By: Morene Hoard M.D.   On: 12/29/2022 06:01   MR ANGIO NECK WO CONTRAST Result Date: 12/29/2022 CLINICAL DATA:  Initial evaluation for acute neuro deficit, stroke suspected. EXAM: MRI HEAD WITHOUT CONTRAST MRA HEAD WITHOUT CONTRAST MRA NECK WITHOUT CONTRAST TECHNIQUE: Multiplanar, multiecho pulse sequences of the brain and surrounding structures were obtained without intravenous contrast. Angiographic images of the Circle of Willis were obtained using MRA technique without intravenous contrast. Angiographic images of the neck were obtained using MRA technique without intravenous contrast. Carotid stenosis measurements (when applicable) are obtained utilizing NASCET criteria, using the distal internal carotid diameter as the denominator. COMPARISON:  CT from earlier the same day. FINDINGS: MRI HEAD FINDINGS Brain: Cerebral volume within normal limits. Patchy and confluent T2/FLAIR hyperintensity involving the periventricular and deep white matter both cerebral hemispheres, most like related chronic microvascular ischemic disease, moderately advanced in nature. Encephalomalacia and gliosis with associated chronic hemosiderin staining noted at the right temporoccipital junction. Few additional scattered foci of chronic hemosiderin staining noted about the left occipital and right frontal lobes, suggesting prior hemorrhage at these locations. Few scattered superimposed chronic micro hemorrhages, most likely hypertensive in nature. No abnormal foci of restricted diffusion to suggest acute or subacute ischemia. Gray-white matter differentiation maintained. No acute intracranial  hemorrhage. No mass lesion, mass effect or midline shift. Mild  ex vacuo dilatation of the right lateral ventricle without hydrocephalus. No extra-axial fluid collection. Pituitary gland and suprasellar region within normal limits. Vascular: Major intracranial vascular flow voids are maintained. Skull and upper cervical spine: Craniocervical junction with level limits. Bone marrow signal intensity normal. No scalp soft tissue abnormality. Sinuses/Orbits: Prior bilateral ocular lens replacement. Mild mucosal thickening noted about the paranasal sinuses. No air-fluid levels. Trace left mastoid effusion, of doubtful significance. Other: None. MRA HEAD FINDINGS ANTERIOR CIRCULATION: Atheromatous change about the left carotid siphon with associated short-segment moderate to severe stenosis at the proximal cavernous segment (series 1, image 84). No stenosis about the contralateral right siphon. A1 segments patent bilaterally. Normal anterior communicating artery complex. Anterior cerebral arteries patent without stenosis. No M1 stenosis or occlusion. Distal MCA branches perfused and symmetric. POSTERIOR CIRCULATION: Both V4 segments patent without stenosis. Both PICA patent. Basilar patent without stenosis. Superior cerebellar and posterior cerebral arteries patent bilaterally without significant stenosis. Anatomic variants: None significant.  No intracranial aneurysm. MRA NECK FINDINGS AORTIC ARCH: Partially visualized aortic arch within normal limits for caliber with standard branch pattern. No visible stenosis about the origin of the great vessels. RIGHT CAROTID SYSTEM: Right common and internal carotid arteries are patent with antegrade flow. No evidence for dissection. Mild atheromatous irregularity about the right carotid bulb without hemodynamically significant greater than 50% stenosis. LEFT CAROTID SYSTEM: Left common and internal carotid arteries are patent with antegrade flow. No evidence for dissection.  Atheromatous irregularity about the left carotid bulb without hemodynamically significant greater than 50% stenosis. VERTEBRAL ARTERIES: Both vertebral arteries arise from the subclavian arteries. Proximal left vertebral artery tortuous and intermittently not seen, which could be related to severe stenoses and/or possible proximal occlusion. Left vertebral artery patent with antegrade flow extending from the distal left V1 segment to the skull base. Right vertebral artery patent without stenosis. No visible evidence for dissection. Other: None. IMPRESSION: MRI HEAD: 1. No acute intracranial abnormality. 2. 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 Few additional scattered foci of chronic hemosiderin staining about the left occipital and right frontal lobes, suggesting prior hemorrhage at these locations. 3. Underlying moderate chronic microvascular ischemic disease. MRA HEAD: 1. Negative intracranial MRA for large vessel occlusion. 2. Atheromatous change about the left carotid siphon with associated short-segment moderate to severe stenosis at the proximal cavernous segment. 3. Otherwise wide patency of the major intracranial arterial vasculature. MRA NECK: 1. Atheromatous change about the carotid bifurcations without hemodynamically significant greater than 50% stenosis. 2. Proximal left vertebral artery tortuous and intermittently not seen, which could be related to underlying stenoses and/or possible proximal occlusion. Left vertebral artery otherwise patent extending from the distal left V1 segment to the skull base. 3. Wide patency of the right vertebral artery without stenosis. Electronically Signed   By: Morene Hoard M.D.   On: 12/29/2022 06:01   CT HEAD CODE STROKE WO CONTRAST Result Date: 12/28/2022 CLINICAL DATA:  Code stroke. Initial evaluation for acute neuro deficit, stroke. EXAM: CT HEAD WITHOUT CONTRAST TECHNIQUE: Contiguous axial  images were obtained from the base of the skull through the vertex without intravenous contrast. RADIATION DOSE REDUCTION: This exam was performed according to the departmental dose-optimization program which includes automated exposure control, adjustment of the mA and/or kV according to patient size and/or use of iterative reconstruction technique. COMPARISON:  Prior study from 01/11/2022. FINDINGS: Brain: Cerebral volume within normal limits. Moderate chronic microvascular ischemic disease. Chronic encephalomalacia at the right occipital lobe, stable.  No acute intracranial hemorrhage. No visible acute large vessel territory infarct. No mass lesion or midline shift. No hydrocephalus or extra-axial fluid collection. Vascular: No abnormal hyperdense vessel. Scattered vascular calcifications noted within the carotid siphons. Skull: Scalp soft tissues within normal limits.  Calvarium intact. Sinuses/Orbits: Globes and orbital soft tissues within normal limits. Paranasal sinuses are largely clear. No significant mastoid effusion. Other: None. ASPECTS [facility] Stroke Program Early CT Score) - Ganglionic level infarction (caudate, lentiform nuclei, internal capsule, insula, M1-M3 cortex): 7 - Supraganglionic infarction (M4-M6 cortex): 3 Total score (0-10 with 10 being normal): 10 IMPRESSION: 1. No acute intracranial abnormality. 2. ASPECTS is 10. 3. Moderate chronic microvascular ischemic disease with chronic encephalomalacia at the right occipital lobe, stable. These results were communicated to Dr. Lindzen at 8:12 pm on 12/28/2022 by text page via the [facility] messaging system. Electronically Signed   By: Morene Hoard M.D.   On: 12/28/2022 20:14   DG CHEST PORT 1 VIEW Result Date: 12/27/2022 CLINICAL DATA:  Respiratory distress. EXAM: PORTABLE CHEST 1 VIEW COMPARISON:  12/23/2022 FINDINGS: Low volume film. Cardiopericardial silhouette is at upper limits of normal for size. Diffuse interstitial lung disease again  noted with bibasilar atelectasis or infiltrate. No substantial pleural effusion. No acute bony abnormality. IMPRESSION: Low volume film with diffuse interstitial lung disease and bibasilar atelectasis or infiltrate. Electronically Signed   By: Camellia Candle M.D.   On: 12/27/2022 08:53   DG Chest Port 1 View Result Date: 12/23/2022 CLINICAL DATA:  Shortness of breath EXAM: PORTABLE CHEST 1 VIEW COMPARISON:  07/16/2022 FINDINGS: Slightly shallow inspiration. Mild cardiac enlargement. No vascular congestion. Peribronchial thickening with coarse interstitial changes in the perihilar region likely representing chronic bronchitis. No airspace disease or consolidation. No pleural effusions. No pneumothorax. Mediastinal contours appear intact. Degenerative changes in the spine. Postoperative changes in the cervical spine. IMPRESSION: Chronic bronchitic changes in the lungs. Mild cardiac enlargement. No focal consolidation. Electronically Signed   By: Elsie Gravely M.D.   On: 12/23/2022 01:04    Assessment/Plan  1. Diarrhea, unspecified type (Primary) Afebrile Abdomen nondistended, nontender, soft with hypoactive active bowel sounds x 4 quadrants.  Suspect colitis -Will start on Cipro  and Flagyl  as below - ciprofloxacin  (CIPRO ) 500 MG tablet; Take 1 tablet (500 mg total) by mouth 2 (two) times daily for 7 days.  Dispense: 14 tablet; Refill: 0 - metroNIDAZOLE  (FLAGYL ) 500 MG tablet; Take 1 tablet (500 mg total) by mouth 3 (three) times daily for 7 days.  Dispense: 21 tablet; Refill: 0 - CBC with Differential/Platelet - COMPLETE METABOLIC PANEL WITH GFR  2. Weight loss Has had 13.8 lbs weight loss since last seen 2 weeks ago.   Suspect due to ongoing diarrhea.  Recommend referral to gastroenterologist but declined - COMPLETE METABOLIC PANEL WITH GFR  3. Pulmonary hypertension (HCC) No signs of fluid overload -Continue to monitor high risk factors but was not taking any medication due to ongoing  diarrhea.  4. Seizure disorder, focal motor (HCC) No seizure activity reported -Continue to monitor  5. Scleroderma (HCC) Continue to monitor  Family/ staff Communication: Reviewed plan of care with patient and daughter verbalized understanding  Labs/tests ordered: - CBC with Differential/Platelet - COMPLETE METABOLIC PANEL WITH GFR  Next Appointment: Return in about 3 months (around 04/19/2023) for medical mangement of chronic issues..   Robbi Scurlock C Hershell Brandl, NP

## 2023-01-20 LAB — COMPLETE METABOLIC PANEL WITH GFR
AG Ratio: 1.1 (calc) (ref 1.0–2.5)
ALT: 8 U/L (ref 6–29)
AST: 15 U/L (ref 10–35)
Albumin: 3.4 g/dL — ABNORMAL LOW (ref 3.6–5.1)
Alkaline phosphatase (APISO): 113 U/L (ref 37–153)
BUN/Creatinine Ratio: 9 (calc) (ref 6–22)
BUN: 24 mg/dL (ref 7–25)
CO2: 27 mmol/L (ref 20–32)
Calcium: 8.4 mg/dL — ABNORMAL LOW (ref 8.6–10.4)
Chloride: 99 mmol/L (ref 98–110)
Creat: 2.66 mg/dL — ABNORMAL HIGH (ref 0.60–0.95)
Globulin: 3 g/dL (ref 1.9–3.7)
Glucose, Bld: 181 mg/dL — ABNORMAL HIGH (ref 65–99)
Potassium: 3 mmol/L — ABNORMAL LOW (ref 3.5–5.3)
Sodium: 140 mmol/L (ref 135–146)
Total Bilirubin: 0.5 mg/dL (ref 0.2–1.2)
Total Protein: 6.4 g/dL (ref 6.1–8.1)
eGFR: 17 mL/min/{1.73_m2} — ABNORMAL LOW (ref >=60)

## 2023-01-20 LAB — CBC WITH DIFFERENTIAL/PLATELET
Absolute Lymphocytes: 1220 {cells}/uL (ref 850–3900)
Absolute Monocytes: 637 {cells}/uL (ref 200–950)
Basophils Absolute: 65 {cells}/uL (ref 0–200)
Basophils Relative: 0.6 %
Eosinophils Absolute: 400 {cells}/uL (ref 15–500)
Eosinophils Relative: 3.7 %
HCT: 27.4 % — ABNORMAL LOW (ref 35.0–45.0)
Hemoglobin: 8.8 g/dL — ABNORMAL LOW (ref 11.7–15.5)
MCH: 31.2 pg (ref 27.0–33.0)
MCHC: 32.1 g/dL (ref 32.0–36.0)
MCV: 97.2 fL (ref 80.0–100.0)
MPV: 10.4 fL (ref 7.5–12.5)
Monocytes Relative: 5.9 %
Neutro Abs: 8478 {cells}/uL — ABNORMAL HIGH (ref 1500–7800)
Neutrophils Relative %: 78.5 %
Platelets: 344 10*3/uL (ref 140–400)
RBC: 2.82 10*6/uL — ABNORMAL LOW (ref 3.80–5.10)
RDW: 11.6 % (ref 11.0–15.0)
Total Lymphocyte: 11.3 %
WBC: 10.8 10*3/uL (ref 3.8–10.8)

## 2023-01-21 ENCOUNTER — Other Ambulatory Visit: Payer: Self-pay

## 2023-01-21 ENCOUNTER — Other Ambulatory Visit: Payer: Self-pay | Admitting: Family

## 2023-01-21 DIAGNOSIS — R634 Abnormal weight loss: Secondary | ICD-10-CM

## 2023-01-21 DIAGNOSIS — R197 Diarrhea, unspecified: Secondary | ICD-10-CM

## 2023-01-21 DIAGNOSIS — N185 Chronic kidney disease, stage 5: Secondary | ICD-10-CM

## 2023-01-21 DIAGNOSIS — E876 Hypokalemia: Secondary | ICD-10-CM

## 2023-01-21 DIAGNOSIS — N184 Chronic kidney disease, stage 4 (severe): Secondary | ICD-10-CM

## 2023-01-21 MED ORDER — POTASSIUM CHLORIDE CRYS ER 20 MEQ PO TBCR: 20.0000 meq | EXTENDED_RELEASE_TABLET | Freq: Two times a day (BID) | ORAL | 0 refills | Status: DC

## 2023-01-26 ENCOUNTER — Ambulatory Visit: Payer: Medicare HMO | Admitting: Family

## 2023-01-26 DIAGNOSIS — D631 Anemia in chronic kidney disease: Secondary | ICD-10-CM | POA: Diagnosis not present

## 2023-01-26 DIAGNOSIS — I2510 Atherosclerotic heart disease of native coronary artery without angina pectoris: Secondary | ICD-10-CM | POA: Diagnosis not present

## 2023-01-26 DIAGNOSIS — N179 Acute kidney failure, unspecified: Secondary | ICD-10-CM | POA: Diagnosis not present

## 2023-01-26 DIAGNOSIS — E875 Hyperkalemia: Secondary | ICD-10-CM | POA: Diagnosis not present

## 2023-01-26 DIAGNOSIS — N1832 Chronic kidney disease, stage 3b: Secondary | ICD-10-CM | POA: Diagnosis not present

## 2023-01-26 DIAGNOSIS — N189 Chronic kidney disease, unspecified: Secondary | ICD-10-CM | POA: Diagnosis not present

## 2023-01-26 DIAGNOSIS — I129 Hypertensive chronic kidney disease with stage 1 through stage 4 chronic kidney disease, or unspecified chronic kidney disease: Secondary | ICD-10-CM | POA: Diagnosis not present

## 2023-01-26 DIAGNOSIS — N2581 Secondary hyperparathyroidism of renal origin: Secondary | ICD-10-CM | POA: Diagnosis not present

## 2023-01-26 DIAGNOSIS — E1122 Type 2 diabetes mellitus with diabetic chronic kidney disease: Secondary | ICD-10-CM | POA: Diagnosis not present

## 2023-01-28 ENCOUNTER — Ambulatory Visit (INDEPENDENT_AMBULATORY_CARE_PROVIDER_SITE_OTHER): Payer: Medicare HMO | Admitting: Family

## 2023-01-28 ENCOUNTER — Encounter: Payer: Self-pay | Admitting: Family

## 2023-01-28 VITALS — BP 120/78 | HR 83 | Temp 97.1°F | Ht 60.0 in | Wt 114.4 lb

## 2023-01-28 DIAGNOSIS — L299 Pruritus, unspecified: Secondary | ICD-10-CM | POA: Diagnosis not present

## 2023-01-28 MED ORDER — HYDROXYZINE HCL 10 MG PO TABS: 10.0000 mg | ORAL_TABLET | Freq: Three times a day (TID) | ORAL | 0 refills | Status: DC | PRN

## 2023-01-28 NOTE — Progress Notes (Signed)
 Provider: Roxan Plough FNP-C  Venna Berberich, Roxan BROCKS, NP  Patient Care Team: Ronni Osterberg, Roxan BROCKS, NP as PCP - General (Family Medicine) Shlomo Wilbert SAUNDERS, MD as PCP - Cardiology (Cardiology) Rosemarie Eather RAMAN, MD as Consulting Physician (Neurology) Cleatus Collar, MD as Consulting Physician (Ophthalmology) Aneita Gwendlyn DASEN, MD (Inactive) as Consulting Physician (Gastroenterology) Debera Jayson MATSU, MD as Consulting Physician (Cardiology) Sheree Rush, PA-C as Physician Assistant (Endocrinology) Gwenn Kent, MD (Inactive) as Consulting Physician (Rehabilitation) Rayburn Pac, MD as Consulting Physician (Nephrology) Joshua Blamer, MD as Consulting Physician (Dermatology)  Extended Emergency Contact Information Primary Emergency Contact: [facility]  [place] Home Phone: [phone] Relation: Daughter Secondary Emergency Contact: [facility] Phone: [phone] Relation: Son Interpreter needed? No  Code Status:  DNR Goals of care: Advanced Directive information    01/19/2023    2:18 PM  Advanced Directives  Does Patient Have a Medical Advance Directive? Yes  Type of Advance Directive Living will;Out of facility DNR (pink MOST or yellow form)  Does patient want to make changes to medical advance directive? No - Patient declined  Pre-existing out of facility DNR order (yellow form or pink MOST form) Yellow form placed in chart (order not valid for inpatient use)     Chief Complaint  Patient presents with   Acute Visit    Patient presents today for a rash.    HPI:  Pt is a 89 y.o. female seen today for an acute visit for evaluation of rash  x several days.she is here with her daughter.states patient has been itching all over the body. Has been taking Cipro  antibiotics and Flagyl  for prescribed 01/19/2023 for colitis though took similar treatment prescribed in the ED 12/13/2022 without any side effects.Has two more days of Cipro  and Flagyl   remaining.discussed stopping medication.  Has been scratching more on the upper back and flank area.Also states legs are itchy but no rash. Daughter states gave patient Benadryl  and Loratadine.  She denies any fever,chills,nausea,vomiting,abdominal pain,or change of lotion,soap or detergent.  Also request referral to Gastroenterologist for colitis.Referral was ordered already but states GI has not called.GI location and phone number given to patient's daughter on AVS to call and schedule appointment.   Past Medical History:  Diagnosis Date   Achilles tendon injury 11/27/2010   ALLERGIC RHINITIS 05/12/2006   Qualifier: Diagnosis of  By: Amon MD, Aloysius BRAVO.    Anxiety state 07/03/2009   Qualifier: Diagnosis of  By: Amon MD, Jose E.    Back pain 07/01/2010   Chronic kidney disease (CKD), stage III (moderate) (HCC) 05/31/2014   Chronic right SI joint pain 09/22/2013   Colitis 2024   DEGENERATIVE JOINT DISEASE, CERVICAL SPINE 06/23/2006   Annotation: had a CAT scan with a cervical myelogram that show  left C6-7 and  C5-6 foraminal stenosis Qualifier: Diagnosis of  By: Amon MD, Aloysius BRAVO.    DEPRESSION 05/12/2006   Qualifier: Diagnosis of  By: Amon MD, Jose E.    DIABETES MELLITUS, TYPE II 05/12/2006   Now following w/ Endo at [facility]     DIABETIC  RETINOPATHY 04/27/2007   Qualifier: Diagnosis of  By: Antonio ROSALEA Rockers     Encephalopathy, hypertensive    Essential hypertension 05/12/2006   Qualifier: Diagnosis of  By: Amon MD, Aloysius BRAVO.    GAIT DISTURBANCE 08/07/2009   Qualifier: Diagnosis of  By: Amon MD, Aloysius BRAVO.    GERD (gastroesophageal reflux disease)    History of colonoscopy    History of CT  scan    History of mammogram    History of MRI    Hyperlipidemia associated with type 2 diabetes mellitus (HCC) 05/12/2006   Qualifier: Diagnosis of  By: Amon MD, Jose E.    ILD (interstitial lung disease) (HCC) 05/31/2015   Lobar cerebral hemorrhage (HCC) 06/22/2019   NECK PAIN, CHRONIC 04/03/2008    Qualifier: Diagnosis of  By: Amon MD, Aloysius BRAVO.    Osteoarthritis 02/10/2016   Osteopenia    Osteoporosis 06/23/2006   Annotation: had a bone density test in 08-2004.  T score was -2.4 Qualifier: Diagnosis of  By: Amon MD, Aloysius DRAFTS     SAH (subarachnoid hemorrhage) (HCC)    Scleroderma (HCC) 02/28/2016   Stroke (HCC)    Takotsubo cardiomyopathy    s/p stress MI with stress induced CM with normal coronary arteries by cath 2007 with normalization of LVF by echo 04/2005   TIA (transient ischemic attack)    Vasculitis of skin 09/28/2012   Vitamin D  deficiency 10/01/2014   Past Surgical History:  Procedure Laterality Date   ABDOMINAL HYSTERECTOMY  1977   no oophorectomy   APPENDECTOMY     CARDIAC CATHETERIZATION     CATARACT EXTRACTION, BILATERAL  2011   SPINAL FUSION  2000   Dr. Unice   TONSILLECTOMY      Allergies  Allergen Reactions   Cephalexin  Nausea And Vomiting    Pt stated made severely sick, will never take again   Pioglitazone Swelling    REACTION: EDEMA   Nintedanib Diarrhea   Sertraline  Nausea And Vomiting    Outpatient Encounter Medications as of 01/28/2023  Medication Sig   acetaminophen  (TYLENOL ) 500 MG tablet Take 500 mg by mouth every 6 (six) hours as needed for mild pain or headache.   aspirin  EC 81 MG tablet Take 81 mg by mouth daily. Swallow whole.   atorvastatin  (LIPITOR ) 80 MG tablet TAKE 1 TABLET EVERY DAY (Patient taking differently: Take 80 mg by mouth every evening.)   citalopram  (CELEXA ) 10 MG tablet TAKE 1 TABLET BY MOUTH EVERY DAY   clonazePAM  (KLONOPIN ) 0.5 MG tablet Take 1 tablet (0.5 mg total) by mouth 2 (two) times daily as needed for anxiety.   clopidogrel  (PLAVIX ) 75 MG tablet Take 1 tablet (75 mg total) by mouth daily.   cyanocobalamin  1000 MCG tablet Take 1 tablet (1,000 mcg total) by mouth daily.   famotidine  (PEPCID ) 20 MG tablet Take 2 tablets (40 mg total) by mouth daily.   furosemide  (LASIX ) 20 MG tablet Take 1 tablet (20 mg total) by mouth  daily as needed. Take only if weight is +3 lbs greater than baseline weight in 1 day or +5 lbs in 1 week   hydrALAZINE  (APRESOLINE ) 25 MG tablet Take 1 tablet (25 mg total) by mouth 3 (three) times daily.   ipratropium-albuterol  (DUONEB) 0.5-2.5 (3) MG/3ML SOLN Take 3 mLs by nebulization every 6 (six) hours as needed.   metoprolol  succinate (TOPROL -XL) 50 MG 24 hr tablet Take 1 tablet (50 mg total) by mouth daily. Take with or immediately following a meal.   NOVOLOG  FLEXPEN 100 UNIT/ML FlexPen Inject 6-8 Units into the skin 3 (three) times daily before meals. Pt completes sliding scale.   potassium chloride  SA (KLOR-CON  M) 20 MEQ tablet Take 1 tablet (20 mEq total) by mouth 2 (two) times daily.   sodium bicarbonate  650 MG tablet Take 2 tablets (1,300 mg total) by mouth 2 (two) times daily.   No facility-administered encounter medications on file  as of 01/28/2023.    Review of Systems  Constitutional:  Negative for appetite change, chills, fatigue, fever and unexpected weight change.  HENT:  Negative for congestion, ear discharge, ear pain, hearing loss, nosebleeds, postnasal drip, rhinorrhea, sinus pressure, sinus pain, sneezing, sore throat, tinnitus and trouble swallowing.   Eyes:  Negative for pain, discharge, redness, itching and visual disturbance.  Respiratory:  Negative for cough, chest tightness, shortness of breath and wheezing.   Cardiovascular:  Negative for chest pain, palpitations and leg swelling.  Gastrointestinal:  Negative for abdominal distention, abdominal pain, blood in stool, constipation, diarrhea, nausea and vomiting.  Genitourinary:  Negative for difficulty urinating, dysuria, flank pain, frequency and urgency.  Musculoskeletal:  Positive for gait problem. Negative for arthralgias, back pain, joint swelling, myalgias, neck pain and neck stiffness.  Skin:  Positive for rash. Negative for color change, pallor and wound.  Neurological:  Negative for dizziness, weakness,  light-headedness and headaches.  Hematological:  Does not bruise/bleed easily.  Psychiatric/Behavioral:  Negative for agitation, behavioral problems, confusion and hallucinations. The patient is not nervous/anxious.        Sleep disturbance from itching     Immunization History  Administered Date(s) Administered   Fluad Quad(high Dose 65+) 10/20/2018   Fluad Trivalent(High Dose 65+) 11/30/2022   H1N1 01/05/2008   Influenza Split 10/22/2009, 12/09/2010, 10/24/2013, 10/01/2014, 09/14/2016   Influenza Whole 12/27/2006, 10/18/2007, 01/05/2008, 12/19/2008, 10/22/2009   Influenza, High Dose Seasonal PF 12/16/2015, 10/27/2017, 10/20/2018   Influenza,inj,Quad PF,6+ Mos 10/24/2013, 10/01/2014, 09/14/2016   Influenza,inj,quad, With Preservative 11/23/2016   Influenza-Unspecified 12/27/2006, 01/05/2008, 12/19/2008, 12/09/2010, 11/30/2011, 10/19/2012, 10/20/2018, 10/03/2019, 10/08/2020, 09/30/2021   Novel Infuenza-h1n1-09 01/05/2008   PFIZER(Purple Top)SARS-COV-2 Vaccination 05/01/2019, 05/22/2019, 11/28/2019   Pneumococcal Conjugate-13 11/02/2013   Pneumococcal Polysaccharide-23 11/19/2009   Td 06/18/2008   Td (Adult),5 Lf Tetanus Toxid, Preservative Free 06/18/2008   Zoster, Live 05/03/2007, 11/03/2007   Pertinent  Health Maintenance Due  Topic Date Due   OPHTHALMOLOGY EXAM  07/29/2017   HEMOGLOBIN A1C  05/30/2023   FOOT EXAM  06/02/2023   INFLUENZA VACCINE  Completed   DEXA SCAN  Completed   MAMMOGRAM  Discontinued      08/21/2022    3:19 PM 09/01/2022    1:50 PM 01/01/2023    1:37 PM 01/19/2023    2:16 PM 01/28/2023    1:18 PM  Fall Risk  Falls in the past year? 0 0 0 1 0  Was there an injury with Fall? 0 0 0 0 0  Fall Risk Category Calculator 0 0 0 2 0  Patient at Risk for Falls Due to No Fall Risks No Fall Risks No Fall Risks  History of fall(s)  Fall risk Follow up Falls evaluation completed Falls evaluation completed Education provided  Falls evaluation completed   Functional  Status Survey:    Vitals:   01/28/23 1319  BP: 120/78  Pulse: 83  Temp: (!) 97.1 F (36.2 C)  SpO2: 96%  Weight: 114 lb 6.4 oz (51.9 kg)  Height: 5' (1.524 m)   Body mass index is 22.34 kg/m. Physical Exam Vitals reviewed.  Constitutional:      General: She is not in acute distress.    Appearance: Normal appearance. She is normal weight. She is not ill-appearing or diaphoretic.  HENT:     Head: Normocephalic.     Mouth/Throat:     Mouth: Mucous membranes are moist.     Pharynx: Oropharynx is clear. No oropharyngeal exudate or posterior oropharyngeal erythema.  Eyes:     General: No scleral icterus.       Right eye: No discharge.        Left eye: No discharge.     Conjunctiva/sclera: Conjunctivae normal.     Pupils: Pupils are equal, round, and reactive to light.  Cardiovascular:     Rate and Rhythm: Normal rate and regular rhythm.     Pulses: Normal pulses.     Heart sounds: Normal heart sounds. No murmur heard.    No friction rub. No gallop.  Pulmonary:     Effort: Pulmonary effort is normal. No respiratory distress.     Breath sounds: Normal breath sounds. No wheezing, rhonchi or rales.  Chest:     Chest wall: No tenderness.  Abdominal:     General: Bowel sounds are normal. There is no distension.     Palpations: Abdomen is soft. There is no mass.     Tenderness: There is no abdominal tenderness. There is no right CVA tenderness, left CVA tenderness, guarding or rebound.  Musculoskeletal:        General: No swelling or tenderness. Normal range of motion.     Cervical back: Normal range of motion. No rigidity or tenderness.     Right lower leg: No edema.     Left lower leg: No edema.  Lymphadenopathy:     Cervical: No cervical adenopathy.  Skin:    General: Skin is warm and dry.     Coloration: Skin is not pale.     Findings: No bruising, erythema or lesion.     Comments: Right upper back scattered x 4 denuded raised some with scab without any drainage or  erythema.flank area scratch marks but no rash.No rash noted on face,extremities or abdomen.   Neurological:     Mental Status: She is alert and oriented to person, place, and time.     Motor: No weakness.     Gait: Gait abnormal.  Psychiatric:        Mood and Affect: Mood normal.        Speech: Speech normal.        Behavior: Behavior normal.     Labs reviewed: Recent Labs    12/20/22 0530 12/21/22 0750 12/28/22 0704 12/29/22 0452 01/01/23 1418 01/19/23 1455  NA 141   < > 140 141 139 140  K 4.3   < > 4.0 3.8 3.8 3.0*  CL 119*   < > 106 104 101 99  CO2 16*   < > 25 27 28 27   GLUCOSE 177*   < > 153* 132* 202* 181*  BUN 53*   < > 29* 28* 35* 24  CREATININE 4.21*   < > 3.42* 3.14* 3.52* 2.66*  CALCIUM  7.9*   < > 7.9* 7.9* 8.4* 8.4*  MG  --   --  1.3*  --   --   --   PHOS 5.7*  --   --   --   --   --    < > = values in this interval not displayed.   Recent Labs    12/13/22 1323 12/20/22 0530 12/28/22 0704 12/29/22 0452 01/01/23 1418 01/19/23 1455  AST 20  --  18 18 14 15   ALT 15  --  14 13 10 8   ALKPHOS 73  --  52 51  --   --   BILITOT 0.6  --  0.5 0.5 0.5 0.5  PROT 6.4*  --  5.5* 5.5* 6.3 6.4  ALBUMIN 3.2* 2.7* 2.7* 2.6*  --   --    Recent Labs    12/23/22 0654 12/24/22 0615 12/27/22 0725 01/01/23 1418 01/19/23 1455  WBC 7.1   < > 7.7 7.0 10.8  NEUTROABS 4.6  --   --  4,270 8,478*  HGB 8.3*   < > 7.8* 7.8* 8.8*  HCT 25.0*   < > 24.1* 24.6* 27.4*  MCV 97.7   < > 98.4 100.4* 97.2  PLT 146*   < > 242 297 344   < > = values in this interval not displayed.   Lab Results  Component Value Date   TSH 3.50 06/08/2022   Lab Results  Component Value Date   HGBA1C 8.2 (H) 11/30/2022   Lab Results  Component Value Date   CHOL 95 12/29/2022   HDL 35 (L) 12/29/2022   LDLCALC 37 12/29/2022   LDLDIRECT 110.0 10/13/2016   TRIG 116 12/29/2022   CHOLHDL 2.7 12/29/2022    Significant Diagnostic Results in last 30 days:  No results found.  Assessment/Plan    Itching (Primary) Afebrile  Right upper back scattered x 4 denuded raised some with scab without any drainage or erythema.flank area scratch marks but no rash.No rash noted on face,extremities or abdomen. Suspect possible bug bite verse reaction to Cipro  and Flagyl . Will start on hydroxyzine  as below encouraged to continue on Loratadine during the day and hydroxyzine  at bedtime to prevent daytime drowsiness.stated jokingly does not mind sleeping during the day.   - hydrOXYzine  (ATARAX ) 10 MG tablet; Take 1 tablet (10 mg total) by mouth 3 (three) times daily as needed for itching.  Dispense: 30 tablet; Refill: 0 - Advised to notify provider if symptoms worsen or fail to improve    Family/ staff Communication: Reviewed plan of care with patient and daughter verbalized understanding   Labs/tests ordered: None   Next Appointment: Return if symptoms worsen or fail to improve.   Roxan JAYSON Plough, NP

## 2023-01-28 NOTE — Patient Instructions (Addendum)
 Adult nurse [facility] [address] [place]. [place], [place]  [place]: Tel# [phone] Fax: [phone]

## 2023-02-04 ENCOUNTER — Ambulatory Visit: Payer: Medicare HMO | Admitting: Adult Health

## 2023-02-04 NOTE — Progress Notes (Signed)
HPI :  89 year old female with history of CKD, chronic diarrhea, abnormal CT imaging of her colon, referred here by Richarda Blade NP for diarrhea and suspected colitis.  She has been seen here remotely but not in many years, previously by Dr. Russella Dar in 2009.  She and daughter report chronic diarrhea ongoing for at least the past year. The symptoms have been severe enough to cause a 20-pound weight loss over the past two months. The diarrhea is described as sudden in onset, with no warning, leading to occasional accidents. During severe episodes, the patient reports having diarrhea four to five times a day. There is no reported blood in the stool.  However, she also has periods of time of formed stool and that this does not bother her.  At the end of November, the patient had worsening of her chronic diarrhea which led to an ED visit.  At that time they performed a CT scan on November 24 showing left-sided colitis.  No stool studies obtained.  She was empirically treated with Cipro and Flagyl.  Unfortunately over several days her symptoms worsened, she ultimately got admitted to the hospital and was there for several days. During this admission, the patient's condition worsened, with decline in kidney function.  She also had respiratory distress, pulmonary edema, leading to diuresis which was very tenuous given her renal function.  Since being discharged the patient's kidney function has since improved, as reported by her nephrologist.  The patient's bowel movements have started to normalize since her hospital discharge, with a transition from loose to harder stools. However, the patient still reports occasional episodes of sudden-onset diarrhea.  Most recently she has been passing solid stools. The patient has not been on any specific medication for bowel management since her hospital discharge. She was given Pepcid for reflux. She has been on a regimen of Plavix, for history of TIA/CVA.  They report her  last TIA was Christmas of 2023. The patient denies taking any over-the-counter medications such as ibuprofen or Advil, but does take Tylenol for pain management.  Her last colonoscopy was in 2009 and normal.  We discussed how aggressive she want to be with further evaluation of this and treatment given her comorbidities and age.  She denies any cardiopulmonary symptoms today  CT abdomen / pelvis 12/13/22 -  IMPRESSION: *Findings compatible with acute uncomplicated colitis involving the descending colon and proximal sigmoid colon. Differential diagnosis includes infectious, inflammatory or ischemic etiology, likely in that order. *Multiple other nonacute observations, as described above.  Colonoscopy 06/01/2007 - Dr. Russella Dar - hemorrhoids, otherwise normal  Echo 12/2022: EF 60-65%, grade II DD    Past Medical History:  Diagnosis Date   Achilles tendon injury 11/27/2010   ALLERGIC RHINITIS 05/12/2006   Qualifier: Diagnosis of  By: Drue Novel MD, Nolon Rod.    Anxiety state 07/03/2009   Qualifier: Diagnosis of  By: Drue Novel MD, Jose E.    Back pain 07/01/2010   Chronic kidney disease (CKD), stage III (moderate) (HCC) 05/31/2014   Chronic right SI joint pain 09/22/2013   Colitis 2024   DEGENERATIVE JOINT DISEASE, CERVICAL SPINE 06/23/2006   Annotation: had a CAT scan with a cervical myelogram that show  left C6-7 and  C5-6 foraminal stenosis Qualifier: Diagnosis of  By: Drue Novel MD, Nolon Rod.    DEPRESSION 05/12/2006   Qualifier: Diagnosis of  By: Drue Novel MD, Jose E.    DIABETES MELLITUS, TYPE II 05/12/2006   Now following w/ Endo at [facility]  DIABETIC  RETINOPATHY 04/27/2007   Qualifier: Diagnosis of  By: Janit Bern     Encephalopathy, hypertensive    Essential hypertension 05/12/2006   Qualifier: Diagnosis of  By: Drue Novel MD, Nolon Rod.    GAIT DISTURBANCE 08/07/2009   Qualifier: Diagnosis of  By: Drue Novel MD, Nolon Rod.    GERD (gastroesophageal reflux disease)    History of colonoscopy    History of CT scan     History of mammogram    History of MRI    Hyperlipidemia associated with type 2 diabetes mellitus (HCC) 05/12/2006   Qualifier: Diagnosis of  By: Drue Novel MD, Jose E.    ILD (interstitial lung disease) (HCC) 05/31/2015   Lobar cerebral hemorrhage (HCC) 06/22/2019   NECK PAIN, CHRONIC 04/03/2008   Qualifier: Diagnosis of  By: Drue Novel MD, Nolon Rod.    Osteoarthritis 02/10/2016   Osteopenia    Osteoporosis 06/23/2006   Annotation: had a bone density test in 08-2004.  T score was -2.4 Qualifier: Diagnosis of  By: Drue Novel MD, Nolon Rod     [facility] (subarachnoid hemorrhage) (HCC)    Scleroderma (HCC) 02/28/2016   Stroke [facility])    Takotsubo cardiomyopathy    s/p stress MI with stress induced CM with normal coronary arteries by cath 2007 with normalization of LVF by echo 04/2005   TIA (transient ischemic attack)    Vasculitis of skin 09/28/2012   Vitamin D deficiency 10/01/2014     Past Surgical History:  Procedure Laterality Date   ABDOMINAL HYSTERECTOMY  1977   no oophorectomy   APPENDECTOMY     CARDIAC CATHETERIZATION     CATARACT EXTRACTION, BILATERAL  2011   SPINAL FUSION  2000   Dr. Venetia Maxon   TONSILLECTOMY     Family History  Problem Relation Age of Onset   Stroke Mother    Cancer Sister    Lung disease Sister    Intracerebral hemorrhage Daughter        assoc with post-partum   Hypertension Daughter    Breast cancer Other        ?Aunt   Cancer Other        breast?   Coronary artery disease Neg Hx    Social History   Tobacco Use   Smoking status: Never    Passive exposure: Yes   Smokeless tobacco: Never   Tobacco comments:    Mother & Children  Vaping Use   Vaping status: Never Used  Substance Use Topics   Alcohol use: Yes    Alcohol/week: 1.0 standard drink of alcohol    Types: 1 Glasses of wine per week    Comment: rarely   Drug use: No   Current Outpatient Medications  Medication Sig Dispense Refill   acetaminophen (TYLENOL) 500 MG tablet Take 500 mg by mouth every 6  (six) hours as needed for mild pain or headache.     atorvastatin (LIPITOR) 80 MG tablet TAKE 1 TABLET EVERY DAY (Patient taking differently: Take 80 mg by mouth every evening.) 90 tablet 1   citalopram (CELEXA) 10 MG tablet TAKE 1 TABLET BY MOUTH EVERY DAY (Patient taking differently: Take 10 mg by mouth daily as needed.) 30 tablet 2   clonazePAM (KLONOPIN) 0.5 MG tablet Take 1 tablet (0.5 mg total) by mouth 2 (two) times daily as needed for anxiety. 45 tablet 3   clopidogrel (PLAVIX) 75 MG tablet Take 1 tablet (75 mg total) by mouth daily. 90 tablet 1   famotidine (PEPCID) 20 MG  tablet Take 2 tablets (40 mg total) by mouth daily. (Patient taking differently: Take 20 mg by mouth 2 (two) times daily as needed.) 180 tablet 0   furosemide (LASIX) 20 MG tablet Take 1 tablet (20 mg total) by mouth daily as needed. Take only if weight is +3 lbs greater than baseline weight in 1 day or +5 lbs in 1 week 30 tablet 0   hydrALAZINE (APRESOLINE) 25 MG tablet Take 1 tablet (25 mg total) by mouth 3 (three) times daily. 270 tablet 0   NOVOLOG FLEXPEN 100 UNIT/ML FlexPen Inject 6-8 Units into the skin 3 (three) times daily before meals. Pt completes sliding scale.     hydrOXYzine (ATARAX) 10 MG tablet Take 1 tablet (10 mg total) by mouth 3 (three) times daily as needed for itching. (Patient not taking: Reported on 02/05/2023) 30 tablet 0   metoprolol succinate (TOPROL-XL) 50 MG 24 hr tablet Take 1 tablet (50 mg total) by mouth daily. Take with or immediately following a meal. (Patient not taking: Reported on 02/05/2023) 30 tablet 0   potassium chloride SA (KLOR-CON M) 20 MEQ tablet Take 1 tablet (20 mEq total) by mouth 2 (two) times daily. (Patient not taking: Reported on 02/05/2023) 6 tablet 0   sodium bicarbonate 650 MG tablet Take 2 tablets (1,300 mg total) by mouth 2 (two) times daily. (Patient not taking: Reported on 02/05/2023) 360 tablet 0   No current facility-administered medications for this visit.    Allergies  Allergen Reactions   Cephalexin Nausea And Vomiting    Pt stated made severely sick, will never take again   Pioglitazone Swelling    REACTION: EDEMA   Nintedanib Diarrhea   Sertraline Nausea And Vomiting     Review of Systems: All systems reviewed and negative except where noted in HPI.   Lab Results  Component Value Date   WBC 10.8 01/19/2023   HGB 8.8 (L) 01/19/2023   HCT 27.4 (L) 01/19/2023   MCV 97.2 01/19/2023   PLT 344 01/19/2023    Lab Results  Component Value Date   NA 140 01/19/2023   CL 99 01/19/2023   K 3.0 (L) 01/19/2023   CO2 27 01/19/2023   BUN 24 01/19/2023   CREATININE 2.66 (H) 01/19/2023   EGFR 17 (L) 01/19/2023   CALCIUM 8.4 (L) 01/19/2023   PHOS 5.7 (H) 12/20/2022   ALBUMIN 2.6 (L) 12/29/2022   GLUCOSE 181 (H) 01/19/2023    Lab Results  Component Value Date   ALT 8 01/19/2023   AST 15 01/19/2023   ALKPHOS 51 12/29/2022   BILITOT 0.5 01/19/2023    Lab Results  Component Value Date   INR 1.0 01/11/2022   INR 1.0 01/04/2022   INR 1.0 03/17/2021     Physical Exam: BP 122/70   Pulse (!) 102   Ht 5' (1.524 m)   Wt 110 lb (49.9 kg)   SpO2 96%   BMI 21.48 kg/m  Constitutional: Pleasant,female in no acute distress. HEENT: Normocephalic and atraumatic. Conjunctivae are normal. No scleral icterus. Neck supple.  Cardiovascular: Normal rate, regular rhythm.  Pulmonary/chest: Effort normal and breath sounds normal.  Abdominal: Soft, nondistended, nontender. Subcutaneous cysts lower abdomen. There are no masses palpable.  Extremities: no edema Neurological: Alert and oriented to person place and time. Skin: Skin is warm and dry. No rashes noted. Psychiatric: Normal mood and affect. Behavior is normal.   ASSESSMENT: 89 y.o. female here for assessment of the following  1. Chronic diarrhea  2. Abnormal CT scan, gastrointestinal tract    Sounds like at least 1 year worth of intermittent diarrhea with progressive  worsening this past November.  CT scan shows left-sided colitis.  Treated empirically with antibiotics, got worse, admitted to the hospital with associated renal failure.  She developed pulmonary edema and respiratory distress during that hospitalization and is since improved.  She states her kidney function has shown steady improvement after follow-up with nephrology.  Interestingly her diarrhea is not nearly as severe as previous, currently passing formed stools but has occasional loose stools with incontinence.  Discussed the situation with the patient and her daughter.  She does have a history of TIA/CVA on Plavix, and in light of her age, bit higher risk for anesthesia.  Under normal circumstances would recommend full colonoscopy with biopsies, rule out IBD, rule out microscopic colitis.  However, I think bowel prep would be extremely hard for her and given CT findings of sigmoid colitis, we could get a diagnosis with a flex sig.  Further, I do not think we would need full bowel prep for flex sig which would be much easier for her, and we could do this awake and without anesthesia, and on Plavix, further diagnostic exam with minimal risk.  We discussed if they wanted to pursue flex sig for diagnosis first or consider empiric therapy for colitis.  If we were going to consider empiric therapy I would consider Rowasa enemas first or mesalamine.  Very low risk of renal toxicity on the regimen but possible.  Following full discussion of the issues with the patient and her daughter, they opted for flex sig to be done awake without anesthesia, on Plavix.  Will give Dulcolax suppositories and enema preprocedure.  Hopefully we can visualize sigmoid colon well enough to take some biopsies and establish if she has IBD or not.  They agree with the plan.  Further recommendations pending the result  PLAN: - full discussion as outlined above - they opt for flex sig in the [facility] on Plavix, no anesthesia to further  evaluate. Will be done within the next week. Holding off on empiric therapy until this is done  Harlin Rain, MD [facility]

## 2023-02-05 ENCOUNTER — Encounter: Payer: Self-pay | Admitting: Gastroenterology

## 2023-02-05 ENCOUNTER — Ambulatory Visit: Payer: Medicare HMO | Admitting: Gastroenterology

## 2023-02-05 ENCOUNTER — Other Ambulatory Visit: Payer: Self-pay | Admitting: Family

## 2023-02-05 VITALS — BP 122/70 | HR 102 | Ht 60.0 in | Wt 110.0 lb

## 2023-02-05 DIAGNOSIS — R933 Abnormal findings on diagnostic imaging of other parts of digestive tract: Secondary | ICD-10-CM

## 2023-02-05 DIAGNOSIS — K529 Noninfective gastroenteritis and colitis, unspecified: Secondary | ICD-10-CM

## 2023-02-05 NOTE — Telephone Encounter (Signed)
Dose and Instruction variation from active medication list

## 2023-02-05 NOTE — Patient Instructions (Addendum)
If your blood pressure at your visit was 140/90 or greater, please contact your primary care physician to follow up on this. ______________________________________________________  If you are age 88 or older, your body mass index should be between 23-30. Your Body mass index is 21.48 kg/m. If this is out of the aforementioned range listed, please consider follow up with your Primary Care Provider.  If you are age 18 or younger, your body mass index should be between 19-25. Your Body mass index is 21.48 kg/m. If this is out of the aformentioned range listed, please consider follow up with your Primary Care Provider.  ________________________________________________________  The [place] [facility] providers would like to encourage you to use [facility] to communicate with providers for non-urgent requests or questions.  Due to long hold times on the telephone, sending your provider a message by [facility] may be a faster and more efficient way to get a response.  Please allow 48 business hours for a response.  Please remember that this is for non-urgent requests.  _______________________________________________________  Due to recent changes in healthcare laws, you may see the results of your imaging and laboratory studies on MyChart before your provider has had a chance to review them.  We understand that in some cases there may be results that are confusing or concerning to you. Not all laboratory results come back in the same time frame and the provider may be waiting for multiple results in order to interpret others.  Please give [place] 48 hours in order for your provider to thoroughly review all the results before contacting the office for clarification of your results.   You have been scheduled for a flexible sigmoidoscopy. Please follow the written instructions given to you at your visit today.  If you use inhalers (even only as needed), please bring them with you on the day of your procedure.  DO NOT TAKE 7  DAYS PRIOR TO TEST- Trulicity (dulaglutide) Ozempic, Wegovy (semaglutide) Mounjaro (tirzepatide) Bydureon Bcise (exanatide extended release)  DO NOT TAKE 1 DAY PRIOR TO YOUR TEST Rybelsus (semaglutide) Adlyxin (lixisenatide) Victoza (liraglutide) Byetta (exanatide) ___________________________________________________________________________   Thank you for entrusting me with your care and for choosing [facility], Dr. Ileene Patrick    If your blood pressure at your visit was 140/90 or greater, please contact your primary care physician to follow up on this. ______________________________________________________  If you are age 65 or older, your body mass index should be between 23-30. Your Body mass index is 21.48 kg/m. If this is out of the aforementioned range listed, please consider follow up with your Primary Care Provider.  If you are age 66 or younger, your body mass index should be between 19-25. Your Body mass index is 21.48 kg/m. If this is out of the aformentioned range listed, please consider follow up with your Primary Care Provider.  ________________________________________________________  The [place] [facility] providers would like to encourage you to use [facility] to communicate with providers for non-urgent requests or questions.  Due to long hold times on the telephone, sending your provider a message by [facility] may be a faster and more efficient way to get a response.  Please allow 48 business hours for a response.  Please remember that this is for non-urgent requests.  _______________________________________________________  Due to recent changes in healthcare laws, you may see the results of your imaging and laboratory studies on MyChart before your provider has had a chance to review them.  We understand that in some cases there may be results  that are confusing or concerning to you. Not all laboratory results come back in the same time frame and the provider may  be waiting for multiple results in order to interpret others.  Please give [place] 48 hours in order for your provider to thoroughly review all the results before contacting the office for clarification of your results.

## 2023-02-08 ENCOUNTER — Other Ambulatory Visit: Payer: Medicare HMO

## 2023-02-08 ENCOUNTER — Telehealth: Payer: Self-pay | Admitting: Neurology

## 2023-02-08 NOTE — Telephone Encounter (Signed)
Called and spoke to patient's daughter, [name].  She confirmed moving procedure to Thursday afternoon is much better. However, she indicated that she thinks her mom may have had 2 TIAs this weekend but she refused to go to the ER. [name] is calling Neurology this morning to discuss.  She confirmed that if she can't be seen and has another "episode" she will call 911. She will keep [place] posted and understands we will likely need to postpone her procedure, an Unsedated Flex Sigmoidoscopy.

## 2023-02-08 NOTE — Telephone Encounter (Signed)
-----   Message from Benancio Deeds sent at 02/05/2023  5:40 PM EST ----- Regarding: schedule change request Hey there. Have a favor to ask - if someone can call this patient (actually likely her daughter) on Monday to see if she could move her procedure from 1/24 730 AM to 1/23 PM. That way we can get rid of 730 start (which I think would be hard for her). I just had a double fall off 1/24 so could fit her in that afternoon. Thanks for your assistance with this!  Dr. Mervyn Skeeters

## 2023-02-08 NOTE — Telephone Encounter (Signed)
Called patient's daughter [name] and conveyed Dr. Lanetta Inch comments regarding needing Neurology clearance before procedure so we will most likely need to postpone her Flex Sig. In the meantime Dr. Adela Lank has offered her  Rowasa enemas at bedtime x 14 days. Asked her call back to discuss

## 2023-02-08 NOTE — Telephone Encounter (Signed)
Pt's daughter, Glenda Chroman said rescheduling appointment. Daughter said mother is not acting like herself. PCP ordered some blood work, taking her today to get labs because her potassium is low.

## 2023-02-08 NOTE — Telephone Encounter (Signed)
Is daughter asking to reschedule tomorrow's appointment?

## 2023-02-09 ENCOUNTER — Encounter: Payer: Self-pay | Admitting: Adult Health

## 2023-02-09 ENCOUNTER — Ambulatory Visit: Payer: Medicare HMO | Admitting: Adult Health

## 2023-02-09 VITALS — BP 125/60 | HR 92 | Ht 60.0 in | Wt 112.6 lb

## 2023-02-09 DIAGNOSIS — R29818 Other symptoms and signs involving the nervous system: Secondary | ICD-10-CM

## 2023-02-09 DIAGNOSIS — Z8673 Personal history of transient ischemic attack (TIA), and cerebral infarction without residual deficits: Secondary | ICD-10-CM | POA: Diagnosis not present

## 2023-02-09 NOTE — Telephone Encounter (Signed)
Patient's daughter called. Patient has appointment today with Neurology. Agreed  to cancel procedure for Thursday.  We discussed Rowasa but she does not think her mother will be receptive to do enemas.  She will call us back once she discusses it with her mom and will let us know if she would like [place] to send a prescription. Appointment on Thursday cancelled.

## 2023-02-09 NOTE — Telephone Encounter (Signed)
Sorry for the confusion. she called back to reschedule appt cancelled 02/04/23. I scheduled her the next available. I sent you a message to make aware what is going on with the patient. Again apologize did not include that in the note.

## 2023-02-09 NOTE — Patient Instructions (Addendum)
Your Plan:  Would recommend completion of EEG to evaluate for seizures   If you have recurrent episodes, could consider trial of antiseizure medication to see if this helps stop these episodes   Continue Plavix and atorvastatin for secondary stroke prevention measures     Follow-up with Dr. Pearlean Brownie in 4 to 6 months or call earlier if needed     Thank you for coming to see [place] at [facility]. I hope we have been able to provide you high quality care today.  You may receive a patient satisfaction survey over the next few weeks. We would appreciate your feedback and comments so that we may continue to improve ourselves and the health of our patients.

## 2023-02-09 NOTE — Progress Notes (Signed)
[facility] [address] [place]. [place] [address] [phone]       STROKE FOLLOW UP NOTE  Ms. Lawsyn Magadan Date of Birth:  Apr 19, 1933 Medical Record Number:  [id number]   Reason for Referral: stroke follow up    SUBJECTIVE:   CHIEF COMPLAINT:  Chief Complaint  Patient presents with   Follow-up    Pt in room 8. Daughter in room. Here for TIA follow up. Daughter reports pt had TIA on Saturday and Sunday. Pt was scheduled for Flexible sigmoidoscopy. Daughter said balance is worse. Pt fell in Nov was in hospital for 11 days. Daughter said memory has declined as well. Short term memory loss.      HPI:   Jeremie Saltarelli is a 89 y.o. female with pertinent PMHx of right occipital ICH possibly hypertensive vs amyloid angiopathy on 06/18/2019 with residual cognitive and visual impairment, right brain TIA 2/2 SVD 12/2021, HTN, HLD, DM, CKD and anxiety/depression/PTSD.   Update 02/09/2023 JM: Patient returns for follow-up visit after prior visit with Dr. Pearlean Brownie 7 months ago.  She was started on Celexa for suspected anxiety with recurrent episodes of slightly slurred speech followed by anxiety and shaking and improves after sitting and relaxing.  Daughter reports 2 TIAs this past Saturday and Sunday. On Saturday, she had sudden onset of left arm numbness, face drawing up and slurred speech and on Sunday, had right arm numbness, face drawing up and slurred speech, symptoms lasted approximately 40 minutes and then resolved daughter reports checking blood pressure which was around 160s/50s. Patient does not remember these events occurring.  She has had similar episodes previously with workup unremarkable.  She had prolonged hospitalization in December (see below) with similar episode just prior to discharge with MRI brain unremarkable and felt likely anxiety vs migraine equivalent and less likely TIA.  Daughter reports worsening cognition and balance since hospitalization. MMSE  today 22/30.  Compliant on Plavix and atorvastatin.  Routinely follows with PCP for stroke risk factor management.   Of note, was hospitalized for 10 days back in December for acute colitis and acute renal failure superimposed on stage V CKD suspected in setting of dehydration and poor oral intake, received IV fluids with improvement of creatinine level, complicated by fluid retention and respiratory distress with pulmonary edema, improved after Lasix, noted patient not HD candidate and if kidney function worsens, family would need to consider palliative care.  Also discussed ILD with progressive fibrosis and increased symptom burden, stopped Ofev d/t side effects and did not follow-up with pulmonology, discussed setting up home O2 and benefit from outpatient palliative care consult as continued progression expected.  She was made DNR during admission.  Episode of LUE numbness and face drawing up just prior to discharge, MRI brain negative for acute stroke.   She had recent follow-up with GI Dr. Adela Lank for chronic diarrhea and plans on proceeding with flex sig in the [facility] on Plavix, no anesthesia required. Daughter reports she advised GI of recent TIA episodes and they recommended holding off until we further evaluated.    History provided for reference purposes Update 09/10/2022 Dr. Pearlean Brownie: She returns for follow-up after last visit 7 months ago.  She is accompanied by her daughter [name] who provides history.  She states she is doing well.  She feels she may have had possible TIA off-and-on about once or twice a month.  Patient has transient episodes where her speech becomes slightly slurred.  She becomes anxious and starts shaking.  Her  blood pressure has been found to be elevated.  She comes down after a little while if she sits down quietly and rest.  Taking an extra dose of blood pressure medicine or anxiety medication usually calms her down.  She denies loss of vision facial weakness extremity weakness  or numbness during these episodes.  She does use Klonopin 0.5 mg as needed for anxiety but is not on any regular daily medication for anxiety prevention.  She had lab work on 03/13/2022 which showed LDL-cholesterol to be optimal at 65 mg percent but hemoglobin A1c was elevated at 9.1.  She is working with her primary care physician to get better control of diabetes.  She remains on aspirin and Plavix she is tolerating well with minor bruising and no bleeding.   Update 02/12/2022 Dr. Pearlean Brownie: Ms. Mccommons is a pleasant 89 year old Caucasian lady seen today for initial office follow-up visit following hospital consultation for TIA in December 2023.  She is accompanied by her daughter today.  History is obtained from them and review of electronic medical records.  I have personally reviewed pertinent available imaging films in PACS.Zamira Lehrmann is a 89 y.o. female with past medical history of MI, CKD, DM, HTN, HA, HLD, SAH, TIA, ICH w/SDH 2021, and cognitive impairment.  she was at the [employer] attempting to order a drink when she suddenly developed inability to speak, with left facial droop on 01/04/2022. Code stroke was activated by [facility]. LKW 1210. CT head no acute process, ASPECTS 10.  IV thrombolysis was considered because of low NIH stroke scale and rapidly improving exam.  MRI scan of the brain showed no acute intracranial process.  There is remote areas of hemorrhage in the right temporal and occipital lobe with chronic hemorrhagic foci sequelae of small vessel disease bilaterally.  Carotid ultrasound showed bilateral 1-39% stenosis.  2D echo showed ejection fraction of 60 to 65%.  LDL cholesterol was elevated at 102 and hemoglobin A1c at 10.0.  Patient had been on aspirin prior to admission he was discharged on aspirin and Plavix for 3 weeks followed by Plavix alone.  She has done well since discharge without any other recurrent episodes of TIA or stroke.  Her short-term memory continues to be for activities  of daily living.  Lives with her daughter.  She only needs some help with her medications and her ability.  She is tolerating Plavix well without bruising or bleeding.  Her blood pressure is under good control today it is 134/51.  She has is tolerating Lipitor well without muscle aches and pains.  She has no new complaints   Update 10/21/2020 JM: Returns for overdue stroke follow up after prior visit 11 months ago unaccompanied.  Overall stable from stroke standpoint.  Residual mild cognitive deficits stable without worsening. She is doing all prior activities without difficulty. She continue to live with her daughter maintaining all ADLs independently. She will occasionally drive short distance without difficulty.Has since tapered off from topiramate with occasional mild right sided headache which will resolve after use of Tylenol.  Denies any severe or prolonged headaches.  Denies new stroke/TIA symptoms.  Compliant on aspirin and atorvastatin without side effects.  Blood pressure today 140/78. Routinely monitors at home and has been stable.  Glucose levels more recently improving after increase of insulin dosage.  Recent A1c 11.3 down from 12.5.  No further concerns at this time.  Update 11/20/2019 JM: Ms. Gethers returns for stroke follow-up accompanied by her daughter.  Stable since prior  visit with residual mild cognitive impairment and visual impairment which has been gradually improving.  Recent follow-up with ophthalmology with daughter reporting improvement of her vision on exam and plans on follow-up visit in January.  Denies new stroke/TIA symptoms.  Repeat MRI 08/29/2019 showed resolving ICH therefore initiated aspirin 81 mg daily for secondary stroke prevention.  She remains on aspirin 81 mg daily and atorvastatin 40 mg daily without side effects.  Blood pressure today initially 160/76 and not recheck 150/82.  Blood pressure not routinely monitored at home.  Glucose levels stable at home per daughter.  remains on topiramate 25 mg twice daily for headache prophylaxis tolerating well without recent headache occurrence.  Continues to follow with PCP for depression and anxiety and PTSD recently started on mirtazapine but difficulty tolerating and referred to behavioral health.  Majority of today's visit was in regards to her experience during hospitalization while in the ICU.  She is fearful that the nurse will come to her home and speaks of exploring the options.  She is also adamant multiple times throughout the visit that she will never return to [facility].  Per patient and daughter, PCP is currently investigating this further.  No further concerns at this time.  Initial visit 08/07/2019 JM: Ms. Krier is being seen for hospital follow-up accompanied by her daughter.  Discharged home from CIR on 07/05/2019.  Residual deficits of worsened cognitive impairment from baseline which has been slowly improving.  She does question being told of visual impairment during admission -denies peripheral visual loss but does report worsening vision reading up close.  Has ophthalmology appointment scheduled in the near future.  Denies participation in home health or outpatient therapy since CIR discharge.  Currently living with daughter.  Denies new or worsening stroke/TIA symptoms.  Blood pressure today 152/68 -routinely monitors at home and typically stable.  Topamax initiated during CIR for vascular headaches which have continued to be stable.  Tolerating Topamax without side effects.  PCP recently recommended restarting atorvastatin but she is hesitant due to recent weight.  Continues Klonopin at night but does not routinely take Seroquel.  No behavioral related concerns.  Recently had follow-up with PCP with adjustment to diabetic regimen.  No further concerns at this time.  Stroke admission 06/18/2019 Ms. Corliss Cisson is a 89 y.o. female with history of HTN, MI, CKD3, DM2, depression, previous strokes and ICH with  mild cognitive decline over last 1-5yr (mRS 1) presented on 06/18/2019 with headache. CT with R occipital ICH.  Stroke work-up revealed right occipital ICH in setting of mild cognitive decline, etiology possibly hypertensive versus amyloid angiopathy.  MRA unremarkable no evidence of aneurysm or AVM.  Previously on aspirin 81 mg daily and recommended holding dosage due to hemorrhage.  History of HTN discharged on metoprolol 50 mg and increase lisinopril to 20 mg daily.  LDL 76 and recommended holding statin in setting of hemorrhage.  Patient enrolled in SATURN trial through [facility].  Uncontrolled DM with A1c 8.6.  Underlying cognitive impairment with use of clonazepam and Seroquel nightly.  Prior stroke history 11/2010 left parietal convexity SAH, 03/2018 right occipital infarct and 02/2019 TIA vs seizure vs complicated migraine.  Other stroke risk factors include advanced age, EtOH use, family history of stroke and takotsubo cardiomyopathy.  Evaluated by therapy and recommended discharge to CIR for ongoing therapy needs.  Stroke:   R occipital ICH in setting of mild cognitive decline - could be hypertensive vs. amyloid angiopathy CT head right  temporoparietal hemorrhage. B Small vessel disease. Sinus dz MRI  R posterior temporoparietal lobe ICH with SDH, no shift, mild L frontal lobe and L parietal lobe chronic microhemorrhages  MRA head unremarkable, no aneurysm or AVM 2D Echo EF 60-65%. No source of embolus  Carotid Doppler unremarkable LDL 76 HgbA1c 8.6  SCDs for VTE prophylaxis aspirin 81 mg daily prior to admission, now on No antithrombotic given hemorrhage  Therapy recommendations:  CIR Disposition:  CIR     ROS:   14 system review of systems performed and negative with exception of those listed in HPI  PMH:  Past Medical History:  Diagnosis Date   Achilles tendon injury 11/27/2010   ALLERGIC RHINITIS 05/12/2006   Qualifier: Diagnosis of  By: Drue Novel MD, Nolon Rod.    Anxiety state 07/03/2009   Qualifier: Diagnosis of  By: Drue Novel MD, Jose E.    Back pain 07/01/2010   Chronic kidney disease (CKD), stage III (moderate) (HCC) 05/31/2014   Chronic right SI joint pain 09/22/2013   Colitis 2024   DEGENERATIVE JOINT DISEASE, CERVICAL SPINE 06/23/2006   Annotation: had a CAT scan with a cervical myelogram that show  left C6-7 and  C5-6 foraminal stenosis Qualifier: Diagnosis of  By: Drue Novel MD, Nolon Rod.    DEPRESSION 05/12/2006   Qualifier: Diagnosis of  By: Drue Novel MD, Jose E.    DIABETES MELLITUS, TYPE II 05/12/2006   Now following w/ Endo at [facility]     DIABETIC  RETINOPATHY 04/27/2007   Qualifier: Diagnosis of  By: Janit Bern     Encephalopathy, hypertensive    Essential hypertension 05/12/2006   Qualifier: Diagnosis of  By: Drue Novel MD, Nolon Rod.    GAIT DISTURBANCE 08/07/2009   Qualifier: Diagnosis of  By: Drue Novel MD, Nolon Rod.    GERD (gastroesophageal reflux disease)    History of colonoscopy    History of CT scan    History of mammogram    History of MRI    Hyperlipidemia associated with type 2 diabetes mellitus (HCC) 05/12/2006   Qualifier: Diagnosis of  By: Drue Novel MD, Jose E.    ILD (interstitial lung disease) (HCC) 05/31/2015   Lobar cerebral hemorrhage (HCC) 06/22/2019   NECK PAIN, CHRONIC 04/03/2008   Qualifier: Diagnosis of  By: Drue Novel MD, Nolon Rod.    Osteoarthritis 02/10/2016   Osteopenia    Osteoporosis 06/23/2006   Annotation: had a bone density test in 08-2004.  T score was -2.4 Qualifier: Diagnosis of  By: Drue Novel MD, Nolon Rod     [facility] (subarachnoid hemorrhage) (HCC)    Scleroderma (HCC) 02/28/2016   Stroke [facility])    Takotsubo cardiomyopathy    s/p stress MI with stress induced CM with normal coronary arteries by cath 2007 with normalization of LVF by echo 04/2005   TIA (transient ischemic attack)    Vasculitis of skin 09/28/2012   Vitamin D deficiency 10/01/2014    PSH:  Past Surgical History:  Procedure Laterality Date   ABDOMINAL HYSTERECTOMY   1977   no oophorectomy   APPENDECTOMY     CARDIAC CATHETERIZATION     CATARACT EXTRACTION, BILATERAL  2011   SPINAL FUSION  2000   Dr. Venetia Maxon   TONSILLECTOMY      Social History:  Social History   Socioeconomic History   Marital status: Divorced    Spouse name: Not on file   Number of children: 2   Years of education: Not on file   Highest education level: Not on  file  Occupational History   Occupation: Retired    Comment: Audiological scientist  Tobacco Use   Smoking status: Never    Passive exposure: Yes   Smokeless tobacco: Never   Tobacco comments:    Mother & Children  Vaping Use   Vaping status: Never Used  Substance and Sexual Activity   Alcohol use: Yes    Alcohol/week: 1.0 standard drink of alcohol    Types: 1 Glasses of wine per week    Comment: rarely   Drug use: No   Sexual activity: Never  Other Topics Concern   Not on file  Social History Narrative   10/21/20 Lives with her daughter and G-daughter   Diet- working on portion control   Exercise- no routine exercise but active      [facility]:   Originally from [place]. Always lived in [place]. Previously has traveled to [place], [place], [place] , [place], & [address]. Previously did accounting and clerical work. Has a dog currently. Remote exposure to a parakeet. No mold exposure.       Tobacco use, amount per day now: None   Past tobacco use, amount per day: None   How many years did you use tobacco: None   Alcohol use (drinks per week): None   Diet: Diabetic    Do you drink/eat things with caffeine:   Marital status:                                  What year were you married?   Do you live in a house, apartment, assisted living, condo, trailer, etc.? House   Is it one or more stories? One   How many persons live in your home? 3   Do you have pets in your home?( please list) No   Highest Level of education completed? High School   Current or past profession:Accounting    Do you exercise?    No                               Type and how often? N/A   Do you have a living will? Yes   Do you have a DNR form?                                   If not, do you want to discuss one?   Do you have signed POA/HPOA forms?    Yes                    If so, please bring to you appointment      Do you have any difficulty bathing or dressing yourself? Yes   Do you have any difficulty preparing food or eating?  Yes   Do you have any difficulty managing your medications? Yes   Do you have any difficulty managing your finances? Yes   Do you have any difficulty affording your medications?  Sometimes   Social Drivers of Corporate investment banker Strain: Not on file  Food Insecurity: Food Insecurity Present (12/19/2022)   Hunger Vital Sign    Worried About Running Out of Food in the Last Year: Never true    Ran Out of Food in the Last Year: Sometimes true  Transportation Needs: No Transportation Needs (12/19/2022)  PRAPARE - Administrator, Civil Service (Medical): No    Lack of Transportation (Non-Medical): No  Physical Activity: Not on file  Stress: Not on file  Social Connections: Not on file  Intimate Partner Violence: Not At Risk (12/19/2022)   Humiliation, Afraid, Rape, and Kick questionnaire    Fear of Current or Ex-Partner: No    Emotionally Abused: No    Physically Abused: No    Sexually Abused: No    Family History:  Family History  Problem Relation Age of Onset   Stroke Mother    Cancer Sister    Lung disease Sister    Intracerebral hemorrhage Daughter        assoc with post-partum   Hypertension Daughter    Breast cancer Other        ?Aunt   Cancer Other        breast?   Coronary artery disease Neg Hx     Medications:   Current Outpatient Medications on File Prior to Visit  Medication Sig Dispense Refill   acetaminophen (TYLENOL) 500 MG tablet Take 500 mg by mouth every 6 (six) hours as needed for mild pain or headache.     atorvastatin (LIPITOR) 80 MG tablet TAKE 1 TABLET EVERY  DAY (Patient taking differently: Take 80 mg by mouth every evening.) 90 tablet 1   citalopram (CELEXA) 10 MG tablet TAKE 1 TABLET BY MOUTH EVERY DAY (Patient taking differently: Take 10 mg by mouth daily as needed.) 30 tablet 2   clonazePAM (KLONOPIN) 0.5 MG tablet Take 1 tablet (0.5 mg total) by mouth 2 (two) times daily as needed for anxiety. 45 tablet 3   clopidogrel (PLAVIX) 75 MG tablet Take 1 tablet (75 mg total) by mouth daily. 90 tablet 1   famotidine (PEPCID) 40 MG tablet TAKE 1 TABLET EVERY DAY 90 tablet 3   furosemide (LASIX) 20 MG tablet Take 1 tablet (20 mg total) by mouth daily as needed. Take only if weight is +3 lbs greater than baseline weight in 1 day or +5 lbs in 1 week 30 tablet 0   hydrALAZINE (APRESOLINE) 25 MG tablet Take 1 tablet (25 mg total) by mouth 3 (three) times daily. 270 tablet 0   hydrOXYzine (ATARAX) 10 MG tablet Take 1 tablet (10 mg total) by mouth 3 (three) times daily as needed for itching. 30 tablet 0   metoprolol succinate (TOPROL-XL) 50 MG 24 hr tablet Take 1 tablet (50 mg total) by mouth daily. Take with or immediately following a meal. 30 tablet 0   NOVOLOG FLEXPEN 100 UNIT/ML FlexPen Inject 6-8 Units into the skin 3 (three) times daily before meals. Pt completes sliding scale.     potassium chloride SA (KLOR-CON M) 20 MEQ tablet Take 1 tablet (20 mEq total) by mouth 2 (two) times daily. 6 tablet 0   sodium bicarbonate 650 MG tablet Take 2 tablets (1,300 mg total) by mouth 2 (two) times daily. 360 tablet 0   No current facility-administered medications on file prior to visit.    Allergies:   Allergies  Allergen Reactions   Cephalexin Nausea And Vomiting    Pt stated made severely sick, will never take again   Pioglitazone Swelling    REACTION: EDEMA   Nintedanib Diarrhea   Sertraline Nausea And Vomiting      OBJECTIVE:  Physical Exam  Vitals:   02/09/23 1321  BP: 125/60  Pulse: 92  Weight: 112 lb 9.6 oz (51.1 kg)  Height: 5' (  1.524 m)    Body mass index is 21.99 kg/m. No results found.  General: well developed, well nourished,  pleasant elderly Caucasian female, seated, in no evident distress Head: head normocephalic and atraumatic.   Neck: supple with no carotid or supraclavicular bruits Cardiovascular: regular rate and rhythm, no murmurs Musculoskeletal: no deformity Skin:  no rash/petichiae Vascular:  Normal pulses all extremities   Neurologic Exam Mental Status: Awake and fully alert.   Fluent speech and language.  Oriented to place and time. Recent memory slightly impaired and remote memory intact. Attention span, concentration and fund of knowledge intact.  Mood and affect appropriate Cranial Nerves: Pupils equal, briskly reactive to light. Extraocular movements full without nystagmus. Visual fields full to confrontation. Hearing intact. Facial sensation intact. Face, tongue, palate moves normally and symmetrically.  Motor: Normal bulk and tone. Normal strength in all tested extremity muscles. Sensory.: intact to touch , pinprick , position and vibratory sensation.  Coordination: Rapid alternating movements normal in all extremities. Finger-to-nose and heel-to-shin performed accurately bilaterally. Gait and Station: Arises from chair without difficulty. Stance is normal. Gait demonstrates normal stride length and balance without use of assistive device Reflexes: 1+ and symmetric. Toes downgoing.      02/09/2023    2:01 PM  MMSE - Mini Mental State Exam  Orientation to time 4  Orientation to Place 5  Registration 3  Attention/ Calculation 1  Recall 1  Language- name 2 objects 2  Language- repeat 1  Language- follow 3 step command 3  Language- read & follow direction 1  Write a sentence 1  Copy design 0  Total score 22         ASSESSMENT/PLAN: Mikenzie Dargin is a 89 y.o. year old female hx of left parietal convexity SAH in 2012 with questionable RCVS and hx of right occipital ICH 05/2019 secondary to  HTN vs amyloid angiopathy. Vascular risk factors include HTN, HLD, DM, and prior strokes and ICH as well as underlying history of CKD and depression/anxiety.  She has had multiple recurrent episodes of one-sided numbness, drawing of face and slurred speech with work up unremarkable, felt to be related to anxiety vs migraine equivalent, less likely TIA.     Transient neurological symptoms MRI brain 12/2022 for similar LUE numbness, slurred speech and drawing of face no acute abnormality. Neuro exam intact today.  No indication for repeat imaging at this time Patient does not remember event, question possible partial seizures. Does have hx of possible sensory seizure in 2012 with SAH and placed on Depakote for short amount of time.  Recommend completion of EEG Continue to follow with PCP for anxiety management Advised to call with any recurrent events, could consider retrial of ASM Also discussed importance of adequate nutrition and water intake  GI procedure Patient previously scheduled to undergo flex sig in the [facility] with continue Plavix and no anesthesia required.  I believe it is reasonable to proceed with this as low suspicion recent recurrent event low suspicion stroke/TIA related. Will further discuss with Dr. Pearlean Brownie and will update GI Dr. Adela Lank ADDENDUM 02/10/2023: Further discussed with Dr. Pearlean Brownie who agrees that patient can proceed with procedure. Will notify Dr. Adela Lank.  Hx of TIA R occipital ICH:  Residual deficit: Mild cognitive impairment -daughter concerned decline after prolonged hospital stay in 12/2022 with acute renal failure superimposed on stage V chronic kidney disease and progression of ILD. MMSE today 22/30. Will repeat at f/u visit  Continue Plavix and atorvastatin 40 mg daily  for secondary stroke prevention Discussed secondary stroke prevention measures and importance of close PCP follow-up for aggressive stroke risk factor management including BP goal<130/90, HLD with  LDL goal<70 and DM with A1c.<7  Stroke labs: LDL 37 (12/2022), A1c 8.2 (11/2022)    Follow-up in 4 to 6 months with Dr. Pearlean Brownie for recurrent transient events or call earlier if needed    CC:  Ngetich, Dinah C, NP    I spent 40 minutes of face-to-face and non-face-to-face time with patient and daughter.  This included previsit chart review, lab review, study review, order entry, electronic health record documentation, patient and daughter education and discussion regarding above diagnoses and treatment plan and answered all other questions to patient and daughters satisfaction  Ihor Austin, [facility]  [facility] [address] [place], [place] [address]  Phone [phone] Fax [phone] Note: This document was prepared with digital dictation and possible smart phrase technology. Any transcriptional errors that result from this process are unintentional.

## 2023-02-10 ENCOUNTER — Inpatient Hospital Stay (HOSPITAL_COMMUNITY)
Admission: EM | Admit: 2023-02-10 | Discharge: 2023-02-18 | DRG: 521 | Disposition: A | Discharge status: Skilled Nursing Facility | Payer: Medicare HMO | Attending: Internal Medicine | Admitting: Internal Medicine

## 2023-02-10 ENCOUNTER — Emergency Department (HOSPITAL_COMMUNITY): Payer: Medicare HMO

## 2023-02-10 ENCOUNTER — Other Ambulatory Visit: Payer: Self-pay

## 2023-02-10 ENCOUNTER — Encounter (HOSPITAL_COMMUNITY): Payer: Self-pay

## 2023-02-10 ENCOUNTER — Telehealth: Payer: Self-pay

## 2023-02-10 ENCOUNTER — Other Ambulatory Visit: Payer: Self-pay | Admitting: Family

## 2023-02-10 DIAGNOSIS — Z9181 History of falling: Secondary | ICD-10-CM | POA: Diagnosis not present

## 2023-02-10 DIAGNOSIS — S7012XA Contusion of left thigh, initial encounter: Secondary | ICD-10-CM | POA: Diagnosis not present

## 2023-02-10 DIAGNOSIS — Z79899 Other long term (current) drug therapy: Secondary | ICD-10-CM

## 2023-02-10 DIAGNOSIS — I10 Essential (primary) hypertension: Secondary | ICD-10-CM | POA: Diagnosis not present

## 2023-02-10 DIAGNOSIS — Z96642 Presence of left artificial hip joint: Secondary | ICD-10-CM | POA: Diagnosis not present

## 2023-02-10 DIAGNOSIS — G9341 Metabolic encephalopathy: Secondary | ICD-10-CM | POA: Diagnosis not present

## 2023-02-10 DIAGNOSIS — Y92230 Patient room in hospital as the place of occurrence of the external cause: Secondary | ICD-10-CM | POA: Diagnosis not present

## 2023-02-10 DIAGNOSIS — N390 Urinary tract infection, site not specified: Secondary | ICD-10-CM | POA: Diagnosis present

## 2023-02-10 DIAGNOSIS — N189 Chronic kidney disease, unspecified: Secondary | ICD-10-CM

## 2023-02-10 DIAGNOSIS — E11319 Type 2 diabetes mellitus with unspecified diabetic retinopathy without macular edema: Secondary | ICD-10-CM | POA: Diagnosis not present

## 2023-02-10 DIAGNOSIS — R451 Restlessness and agitation: Secondary | ICD-10-CM | POA: Diagnosis present

## 2023-02-10 DIAGNOSIS — M9702XA Periprosthetic fracture around internal prosthetic left hip joint, initial encounter: Secondary | ICD-10-CM | POA: Diagnosis not present

## 2023-02-10 DIAGNOSIS — Y92009 Unspecified place in unspecified non-institutional (private) residence as the place of occurrence of the external cause: Secondary | ICD-10-CM

## 2023-02-10 DIAGNOSIS — I2510 Atherosclerotic heart disease of native coronary artery without angina pectoris: Secondary | ICD-10-CM

## 2023-02-10 DIAGNOSIS — E1122 Type 2 diabetes mellitus with diabetic chronic kidney disease: Secondary | ICD-10-CM | POA: Diagnosis present

## 2023-02-10 DIAGNOSIS — F32A Depression, unspecified: Secondary | ICD-10-CM | POA: Diagnosis present

## 2023-02-10 DIAGNOSIS — Z66 Do not resuscitate: Secondary | ICD-10-CM | POA: Diagnosis not present

## 2023-02-10 DIAGNOSIS — Y92239 Unspecified place in hospital as the place of occurrence of the external cause: Secondary | ICD-10-CM | POA: Diagnosis not present

## 2023-02-10 DIAGNOSIS — I6529 Occlusion and stenosis of unspecified carotid artery: Secondary | ICD-10-CM | POA: Diagnosis not present

## 2023-02-10 DIAGNOSIS — I129 Hypertensive chronic kidney disease with stage 1 through stage 4 chronic kidney disease, or unspecified chronic kidney disease: Secondary | ICD-10-CM | POA: Diagnosis not present

## 2023-02-10 DIAGNOSIS — R41841 Cognitive communication deficit: Secondary | ICD-10-CM | POA: Diagnosis not present

## 2023-02-10 DIAGNOSIS — Z981 Arthrodesis status: Secondary | ICD-10-CM | POA: Diagnosis not present

## 2023-02-10 DIAGNOSIS — Z8679 Personal history of other diseases of the circulatory system: Secondary | ICD-10-CM

## 2023-02-10 DIAGNOSIS — M25559 Pain in unspecified hip: Secondary | ICD-10-CM | POA: Diagnosis not present

## 2023-02-10 DIAGNOSIS — Z881 Allergy status to other antibiotic agents status: Secondary | ICD-10-CM

## 2023-02-10 DIAGNOSIS — Z8249 Family history of ischemic heart disease and other diseases of the circulatory system: Secondary | ICD-10-CM

## 2023-02-10 DIAGNOSIS — R4189 Other symptoms and signs involving cognitive functions and awareness: Secondary | ICD-10-CM | POA: Diagnosis present

## 2023-02-10 DIAGNOSIS — E1169 Type 2 diabetes mellitus with other specified complication: Secondary | ICD-10-CM | POA: Diagnosis not present

## 2023-02-10 DIAGNOSIS — F411 Generalized anxiety disorder: Secondary | ICD-10-CM | POA: Diagnosis present

## 2023-02-10 DIAGNOSIS — Z9841 Cataract extraction status, right eye: Secondary | ICD-10-CM | POA: Diagnosis not present

## 2023-02-10 DIAGNOSIS — I700 Atherosclerosis of aorta: Secondary | ICD-10-CM | POA: Diagnosis not present

## 2023-02-10 DIAGNOSIS — R2689 Other abnormalities of gait and mobility: Secondary | ICD-10-CM | POA: Diagnosis not present

## 2023-02-10 DIAGNOSIS — D631 Anemia in chronic kidney disease: Secondary | ICD-10-CM | POA: Diagnosis present

## 2023-02-10 DIAGNOSIS — F05 Delirium due to known physiological condition: Secondary | ICD-10-CM | POA: Diagnosis not present

## 2023-02-10 DIAGNOSIS — W1830XA Fall on same level, unspecified, initial encounter: Secondary | ICD-10-CM | POA: Diagnosis not present

## 2023-02-10 DIAGNOSIS — Z8673 Personal history of transient ischemic attack (TIA), and cerebral infarction without residual deficits: Secondary | ICD-10-CM | POA: Diagnosis not present

## 2023-02-10 DIAGNOSIS — S72012A Unspecified intracapsular fracture of left femur, initial encounter for closed fracture: Principal | ICD-10-CM | POA: Diagnosis present

## 2023-02-10 DIAGNOSIS — W010XXA Fall on same level from slipping, tripping and stumbling without subsequent striking against object, initial encounter: Secondary | ICD-10-CM | POA: Diagnosis present

## 2023-02-10 DIAGNOSIS — R2681 Unsteadiness on feet: Secondary | ICD-10-CM | POA: Diagnosis not present

## 2023-02-10 DIAGNOSIS — I120 Hypertensive chronic kidney disease with stage 5 chronic kidney disease or end stage renal disease: Secondary | ICD-10-CM | POA: Diagnosis not present

## 2023-02-10 DIAGNOSIS — Z7902 Long term (current) use of antithrombotics/antiplatelets: Secondary | ICD-10-CM

## 2023-02-10 DIAGNOSIS — N185 Chronic kidney disease, stage 5: Secondary | ICD-10-CM | POA: Diagnosis not present

## 2023-02-10 DIAGNOSIS — R1310 Dysphagia, unspecified: Secondary | ICD-10-CM | POA: Diagnosis not present

## 2023-02-10 DIAGNOSIS — Z9842 Cataract extraction status, left eye: Secondary | ICD-10-CM

## 2023-02-10 DIAGNOSIS — S0990XA Unspecified injury of head, initial encounter: Secondary | ICD-10-CM | POA: Diagnosis not present

## 2023-02-10 DIAGNOSIS — Z90710 Acquired absence of both cervix and uterus: Secondary | ICD-10-CM

## 2023-02-10 DIAGNOSIS — M25552 Pain in left hip: Secondary | ICD-10-CM | POA: Diagnosis not present

## 2023-02-10 DIAGNOSIS — S72112A Displaced fracture of greater trochanter of left femur, initial encounter for closed fracture: Secondary | ICD-10-CM | POA: Diagnosis not present

## 2023-02-10 DIAGNOSIS — R0989 Other specified symptoms and signs involving the circulatory and respiratory systems: Secondary | ICD-10-CM | POA: Diagnosis not present

## 2023-02-10 DIAGNOSIS — E785 Hyperlipidemia, unspecified: Secondary | ICD-10-CM | POA: Diagnosis present

## 2023-02-10 DIAGNOSIS — S199XXA Unspecified injury of neck, initial encounter: Secondary | ICD-10-CM | POA: Diagnosis not present

## 2023-02-10 DIAGNOSIS — W19XXXA Unspecified fall, initial encounter: Principal | ICD-10-CM

## 2023-02-10 DIAGNOSIS — M6281 Muscle weakness (generalized): Secondary | ICD-10-CM | POA: Diagnosis not present

## 2023-02-10 DIAGNOSIS — M25562 Pain in left knee: Secondary | ICD-10-CM | POA: Diagnosis not present

## 2023-02-10 DIAGNOSIS — I959 Hypotension, unspecified: Secondary | ICD-10-CM | POA: Diagnosis not present

## 2023-02-10 DIAGNOSIS — R918 Other nonspecific abnormal finding of lung field: Secondary | ICD-10-CM | POA: Diagnosis not present

## 2023-02-10 DIAGNOSIS — G40109 Localization-related (focal) (partial) symptomatic epilepsy and epileptic syndromes with simple partial seizures, not intractable, without status epilepticus: Secondary | ICD-10-CM

## 2023-02-10 DIAGNOSIS — R9089 Other abnormal findings on diagnostic imaging of central nervous system: Secondary | ICD-10-CM | POA: Diagnosis not present

## 2023-02-10 DIAGNOSIS — Z888 Allergy status to other drugs, medicaments and biological substances status: Secondary | ICD-10-CM | POA: Diagnosis not present

## 2023-02-10 DIAGNOSIS — N184 Chronic kidney disease, stage 4 (severe): Secondary | ICD-10-CM | POA: Diagnosis not present

## 2023-02-10 DIAGNOSIS — Z823 Family history of stroke: Secondary | ICD-10-CM

## 2023-02-10 DIAGNOSIS — S72092D Other fracture of head and neck of left femur, subsequent encounter for closed fracture with routine healing: Secondary | ICD-10-CM | POA: Diagnosis not present

## 2023-02-10 DIAGNOSIS — S72002A Fracture of unspecified part of neck of left femur, initial encounter for closed fracture: Secondary | ICD-10-CM | POA: Diagnosis not present

## 2023-02-10 DIAGNOSIS — Z794 Long term (current) use of insulin: Secondary | ICD-10-CM | POA: Diagnosis not present

## 2023-02-10 DIAGNOSIS — M160 Bilateral primary osteoarthritis of hip: Secondary | ICD-10-CM | POA: Diagnosis not present

## 2023-02-10 DIAGNOSIS — S300XXA Contusion of lower back and pelvis, initial encounter: Secondary | ICD-10-CM | POA: Diagnosis not present

## 2023-02-10 DIAGNOSIS — Z803 Family history of malignant neoplasm of breast: Secondary | ICD-10-CM

## 2023-02-10 DIAGNOSIS — J849 Interstitial pulmonary disease, unspecified: Secondary | ICD-10-CM | POA: Diagnosis present

## 2023-02-10 DIAGNOSIS — R739 Hyperglycemia, unspecified: Secondary | ICD-10-CM | POA: Diagnosis not present

## 2023-02-10 DIAGNOSIS — N179 Acute kidney failure, unspecified: Secondary | ICD-10-CM | POA: Diagnosis not present

## 2023-02-10 DIAGNOSIS — I69398 Other sequelae of cerebral infarction: Secondary | ICD-10-CM | POA: Diagnosis not present

## 2023-02-10 DIAGNOSIS — R000 Tachycardia, unspecified: Secondary | ICD-10-CM | POA: Diagnosis not present

## 2023-02-10 LAB — CBC WITH DIFFERENTIAL/PLATELET
Abs Immature Granulocytes: 0.02 10*3/uL (ref 0.00–0.07)
Basophils Absolute: 0 10*3/uL (ref 0.0–0.1)
Basophils Relative: 1 %
Eosinophils Absolute: 0.4 10*3/uL (ref 0.0–0.5)
Eosinophils Relative: 6 %
HCT: 27.4 % — ABNORMAL LOW (ref 36.0–46.0)
Hemoglobin: 8.9 g/dL — ABNORMAL LOW (ref 12.0–15.0)
Immature Granulocytes: 0 %
Lymphocytes Relative: 22 %
Lymphs Abs: 1.3 10*3/uL (ref 0.7–4.0)
MCH: 31.3 pg (ref 26.0–34.0)
MCHC: 32.5 g/dL (ref 30.0–36.0)
MCV: 96.5 fL (ref 80.0–100.0)
Monocytes Absolute: 0.4 10*3/uL (ref 0.1–1.0)
Monocytes Relative: 7 %
Neutro Abs: 3.9 10*3/uL (ref 1.7–7.7)
Neutrophils Relative %: 64 %
Platelets: 233 10*3/uL (ref 150–400)
RBC: 2.84 MIL/uL — ABNORMAL LOW (ref 3.87–5.11)
RDW: 12.9 % (ref 11.5–15.5)
WBC: 6.1 10*3/uL (ref 4.0–10.5)
nRBC: 0 % (ref 0.0–0.2)

## 2023-02-10 LAB — TYPE AND SCREEN
ABO/RH(D): O NEG
Antibody Screen: NEGATIVE

## 2023-02-10 LAB — PROTIME-INR
INR: 1 (ref 0.8–1.2)
Prothrombin Time: 13.6 s (ref 11.4–15.2)

## 2023-02-10 LAB — BASIC METABOLIC PANEL
Anion gap: 8 (ref 5–15)
BUN: 29 mg/dL — ABNORMAL HIGH (ref 8–23)
CO2: 24 mmol/L (ref 22–32)
Calcium: 8.5 mg/dL — ABNORMAL LOW (ref 8.9–10.3)
Chloride: 104 mmol/L (ref 98–111)
Creatinine, Ser: 2.3 mg/dL — ABNORMAL HIGH (ref 0.44–1.00)
GFR, Estimated: 20 mL/min — ABNORMAL LOW (ref >60)
Glucose, Bld: 327 mg/dL — ABNORMAL HIGH (ref 70–99)
Potassium: 4.3 mmol/L (ref 3.5–5.1)
Sodium: 136 mmol/L (ref 135–145)

## 2023-02-10 LAB — CREATININE, SERUM
Creatinine, Ser: 2.22 mg/dL — ABNORMAL HIGH (ref 0.44–1.00)
GFR, Estimated: 21 mL/min — ABNORMAL LOW (ref >60)

## 2023-02-10 LAB — CBG MONITORING, ED: Glucose-Capillary: 281 mg/dL — ABNORMAL HIGH (ref 70–99)

## 2023-02-10 MED ORDER — INSULIN ASPART 100 UNIT/ML IJ SOLN
0.0000 [IU] | Freq: Every day | INTRAMUSCULAR | Status: DC
  Administered 2023-02-10 – 2023-02-11 (×2): 3 [IU] via SUBCUTANEOUS
  Administered 2023-02-14: 2 [IU] via SUBCUTANEOUS

## 2023-02-10 MED ORDER — ONDANSETRON HCL 4 MG/2ML IJ SOLN
4.0000 mg | Freq: Once | INTRAMUSCULAR | Status: AC
  Administered 2023-02-10: 4 mg via INTRAVENOUS
  Filled 2023-02-10: qty 2

## 2023-02-10 MED ORDER — INSULIN ASPART 100 UNIT/ML IJ SOLN
0.0000 [IU] | Freq: Three times a day (TID) | INTRAMUSCULAR | Status: DC
  Administered 2023-02-11 – 2023-02-12 (×2): 5 [IU] via SUBCUTANEOUS
  Administered 2023-02-15: 2 [IU] via SUBCUTANEOUS
  Administered 2023-02-15: 8 [IU] via SUBCUTANEOUS
  Administered 2023-02-16: 0.3 [IU] via SUBCUTANEOUS
  Administered 2023-02-16 – 2023-02-17 (×2): 5 [IU] via SUBCUTANEOUS
  Administered 2023-02-17: 3 [IU] via SUBCUTANEOUS
  Administered 2023-02-17: 2 [IU] via SUBCUTANEOUS
  Administered 2023-02-18: 5 [IU] via SUBCUTANEOUS
  Administered 2023-02-18: 3 [IU] via SUBCUTANEOUS

## 2023-02-10 MED ORDER — FENTANYL CITRATE PF 50 MCG/ML IJ SOSY
50.0000 ug | PREFILLED_SYRINGE | Freq: Once | INTRAMUSCULAR | Status: AC
  Administered 2023-02-10: 50 ug via INTRAVENOUS
  Filled 2023-02-10: qty 1

## 2023-02-10 MED ORDER — HEPARIN SODIUM (PORCINE) 5000 UNIT/ML IJ SOLN
5000.0000 [IU] | Freq: Three times a day (TID) | INTRAMUSCULAR | Status: DC
  Administered 2023-02-11 – 2023-02-14 (×6): 5000 [IU] via SUBCUTANEOUS
  Filled 2023-02-10 (×8): qty 1

## 2023-02-10 MED ORDER — METOPROLOL SUCCINATE ER 50 MG PO TB24
50.0000 mg | ORAL_TABLET | Freq: Every day | ORAL | Status: DC
  Administered 2023-02-12 – 2023-02-16 (×5): 50 mg via ORAL
  Filled 2023-02-10 (×7): qty 1
  Filled 2023-02-10: qty 2

## 2023-02-10 MED ORDER — FENTANYL CITRATE PF 50 MCG/ML IJ SOSY
50.0000 ug | PREFILLED_SYRINGE | INTRAMUSCULAR | Status: DC | PRN
  Administered 2023-02-10: 50 ug via INTRAVENOUS
  Filled 2023-02-10: qty 1

## 2023-02-10 MED ORDER — ATORVASTATIN CALCIUM 80 MG PO TABS
80.0000 mg | ORAL_TABLET | Freq: Every day | ORAL | Status: DC
  Administered 2023-02-12 – 2023-02-18 (×7): 80 mg via ORAL
  Filled 2023-02-10: qty 2
  Filled 2023-02-10 (×7): qty 1

## 2023-02-10 MED ORDER — HYDROMORPHONE HCL 1 MG/ML IJ SOLN: 0.5000 mg | INTRAMUSCULAR | Status: DC | PRN

## 2023-02-10 MED ORDER — HYDRALAZINE HCL 25 MG PO TABS
25.0000 mg | ORAL_TABLET | Freq: Three times a day (TID) | ORAL | Status: DC
Start: 2023-02-10 — End: 2023-02-18
  Administered 2023-02-10 – 2023-02-17 (×17): 25 mg via ORAL
  Filled 2023-02-10 (×23): qty 1

## 2023-02-10 MED ORDER — ACETAMINOPHEN 650 MG RE SUPP: 650.0000 mg | Freq: Four times a day (QID) | RECTAL | Status: DC | PRN

## 2023-02-10 MED ORDER — HYDROMORPHONE HCL 1 MG/ML IJ SOLN
0.5000 mg | INTRAMUSCULAR | Status: DC | PRN
  Administered 2023-02-11 – 2023-02-13 (×8): 0.5 mg via INTRAVENOUS
  Filled 2023-02-10 (×3): qty 0.5
  Filled 2023-02-10: qty 1
  Filled 2023-02-10: qty 0.5
  Filled 2023-02-10: qty 1
  Filled 2023-02-10 (×2): qty 0.5

## 2023-02-10 MED ORDER — CLONAZEPAM 0.5 MG PO TABS
0.5000 mg | ORAL_TABLET | Freq: Two times a day (BID) | ORAL | Status: DC | PRN
  Administered 2023-02-11 – 2023-02-18 (×10): 0.5 mg via ORAL
  Filled 2023-02-10 (×10): qty 1

## 2023-02-10 MED ORDER — ACETAMINOPHEN 325 MG PO TABS
650.0000 mg | ORAL_TABLET | Freq: Four times a day (QID) | ORAL | Status: DC | PRN
  Administered 2023-02-10 – 2023-02-13 (×2): 650 mg via ORAL
  Filled 2023-02-10 (×2): qty 2

## 2023-02-10 MED ORDER — HYDROMORPHONE HCL 1 MG/ML IJ SOLN
0.5000 mg | INTRAMUSCULAR | Status: DC | PRN
  Administered 2023-02-10: 0.5 mg via INTRAVENOUS
  Filled 2023-02-10: qty 1

## 2023-02-10 MED ORDER — HYDROMORPHONE HCL 1 MG/ML IJ SOLN: 1.0000 mg | INTRAMUSCULAR | Status: DC | PRN

## 2023-02-10 NOTE — Telephone Encounter (Signed)
Called and spoke with patient's daughter, Sandra [name], regarding neurology clearance. Offered to reschedule patient's flex sig tomorrow afternoon but [name] declined. [name] informed me that flex sig will have to be pushed out because patient has had several appts this week. [facility] flex sig has been rescheduled to 03/24/23 at 2:30 pm. New instructions will be sent to MyChart. [name] verbalized understanding and had no concerns at the end of the call.  Per Dr. Adela Lank - dulcolax 2 hours beforehand and enemas 1 hour beforehand. thanks

## 2023-02-10 NOTE — Telephone Encounter (Signed)
-----   Message from Benancio Deeds sent at 02/10/2023 11:59 AM EST ----- Sharol Harness can you see if this patient is willing to do flex sig - would be done ON Plavix and AWAKE without sedation per her previous schedule. She is cleared by neurology. I don't think she would do well with any oral prep, would just do dulcolax suppositories and 2 enemas preprocedure.   I have room tomorrow to do it if she can make it, can add her in. If not, I will be out of the office next week, would have to be the following week after or later pending schedule. Thanks.  Dr. Mervyn Skeeters ----- Message ----- From: Ihor Austin, NP Sent: 02/10/2023  10:28 AM EST To: Benancio Deeds, MD  Good morning,   I saw patient yesterday for follow up.  Low suspicion recent events were in setting of TIA but as she is able to have procedure on Plavix and no need of sedation, okay to proceed.   Shanda Bumps, NP

## 2023-02-10 NOTE — ED Notes (Signed)
Patient transported to CT 

## 2023-02-10 NOTE — Progress Notes (Signed)
I agree with the above plan 

## 2023-02-10 NOTE — ED Triage Notes (Signed)
[facility] reports pt coming from home for a fall. Pt states she was just moving too fast and lost her footing. Pt c/o left hip and sacral pain. EMS gave of Fentanyl.

## 2023-02-10 NOTE — ED Provider Notes (Signed)
[place] EMERGENCY DEPARTMENT AT [facility] Provider Note   CSN: [id number] Arrival date & time: 02/10/23  1700     History {Add pertinent medical, surgical, social history, OB history to HPI:1} Chief Complaint  Patient presents with   Sandra [name] is a 89 y.o. female.  She is brought in by ambulance from home.  She said she was asleep and she woke up and had to get to the bathroom.  She lost her balance because she was going too fast and fell.  She said she hit her head and has significant pain in her left buttock and hip, left knee.  No numbness or weakness.  No neck pain or back pain.  No chest or abdominal pain.  The history is provided by the patient and the EMS personnel.  Fall This is a new problem. The problem has not changed since onset.Pertinent negatives include no chest pain, no abdominal pain, no headaches and no shortness of breath. The symptoms are aggravated by bending and twisting. Nothing relieves the symptoms. Treatments tried: fentanyl. The treatment provided moderate relief.       Home Medications Prior to Admission medications   Medication Sig Start Date End Date Taking? Authorizing Provider  acetaminophen (TYLENOL) 500 MG tablet Take 500 mg by mouth every 6 (six) hours as needed for mild pain or headache.    [provider]  amLODipine (NORVASC) 5 MG tablet TAKE 1 TABLET EVERY DAY 02/10/23   Ngetich, Dinah C, NP  atorvastatin (LIPITOR) 80 MG tablet TAKE 1 TABLET EVERY DAY Patient taking differently: Take 80 mg by mouth every evening. 11/26/22   Ngetich, Dinah C, NP  citalopram (CELEXA) 10 MG tablet TAKE 1 TABLET BY MOUTH EVERY DAY Patient taking differently: Take 10 mg by mouth daily as needed. 12/07/22   Micki Riley, MD  clonazePAM (KLONOPIN) 0.5 MG tablet Take 1 tablet (0.5 mg total) by mouth 2 (two) times daily as needed for anxiety. 11/30/22   Ngetich, Dinah C, NP  clopidogrel (PLAVIX) 75 MG tablet TAKE 1 TABLET EVERY DAY  02/10/23   Ngetich, Dinah C, NP  famotidine (PEPCID) 40 MG tablet TAKE 1 TABLET EVERY DAY 02/05/23   Ngetich, Dinah C, NP  furosemide (LASIX) 20 MG tablet Take 1 tablet (20 mg total) by mouth daily as needed. Take only if weight is +3 lbs greater than baseline weight in 1 day or +5 lbs in 1 week 12/29/22   Carollee Herter, DO  hydrALAZINE (APRESOLINE) 25 MG tablet Take 1 tablet (25 mg total) by mouth 3 (three) times daily. 12/29/22 03/29/23  Carollee Herter, DO  hydrOXYzine (ATARAX) 10 MG tablet Take 1 tablet (10 mg total) by mouth 3 (three) times daily as needed for itching. 01/28/23   Ngetich, Dinah C, NP  metoprolol succinate (TOPROL-XL) 50 MG 24 hr tablet Take 1 tablet (50 mg total) by mouth daily. Take with or immediately following a meal. 07/05/19   Love, Evlyn Kanner, PA-C  NOVOLOG FLEXPEN 100 UNIT/ML FlexPen Inject 6-8 Units into the skin 3 (three) times daily before meals. Pt completes sliding scale. 02/18/21   [provider]  potassium chloride SA (KLOR-CON M) 20 MEQ tablet Take 1 tablet (20 mEq total) by mouth 2 (two) times daily. 01/21/23   Ngetich, Dinah C, NP  sodium bicarbonate 650 MG tablet Take 2 tablets (1,300 mg total) by mouth 2 (two) times daily. 12/29/22 03/29/23  Carollee Herter, DO      Allergies  Cephalexin, Pioglitazone, Nintedanib, and Sertraline    Review of Systems   Review of Systems  Constitutional:  Negative for fever.  Respiratory:  Negative for shortness of breath.   Cardiovascular:  Negative for chest pain.  Gastrointestinal:  Negative for abdominal pain.  Musculoskeletal:  Negative for neck pain.  Neurological:  Negative for headaches.    Physical Exam Updated Vital Signs BP (!) 150/94 (BP Location: Right Arm)   Pulse 78   Temp 98.2 F (36.8 C) (Oral)   Resp 18   SpO2 99%  Physical Exam Vitals and nursing note reviewed.  Constitutional:      General: She is not in acute distress.    Appearance: Normal appearance. She is well-developed.  HENT:     Head:  Normocephalic and atraumatic.  Eyes:     Conjunctiva/sclera: Conjunctivae normal.  Cardiovascular:     Rate and Rhythm: Normal rate and regular rhythm.     Heart sounds: No murmur heard. Pulmonary:     Effort: Pulmonary effort is normal. No respiratory distress.     Breath sounds: Normal breath sounds.  Abdominal:     Palpations: Abdomen is soft.     Tenderness: There is no abdominal tenderness.  Musculoskeletal:        General: Tenderness present.     Cervical back: Neck supple.     Comments: She has some tenderness of her left buttock and left hip, left knee.  She is keeping her leg flexed up at 90 degrees.  Distal pulses motor and sensation intact.  Other extremities full range of motion without any pain or limitations.  Skin:    General: Skin is warm and dry.     Capillary Refill: Capillary refill takes less than 2 seconds.  Neurological:     General: No focal deficit present.     Mental Status: She is alert.     Sensory: No sensory deficit.     Motor: No weakness.     ED Results / Procedures / Treatments   Labs (all labs ordered are listed, but only abnormal results are displayed) Labs Reviewed  BASIC METABOLIC PANEL  CBC WITH DIFFERENTIAL/PLATELET  PROTIME-INR  TYPE AND SCREEN    EKG None  Radiology No results found.  Procedures Procedures  {Document cardiac monitor, telemetry assessment procedure when appropriate:1}  Medications Ordered in ED Medications - No data to display  ED Course/ Medical Decision Making/ A&P   {   Click here for ABCD2, HEART and other calculatorsREFRESH Note before signing :1}                              Medical Decision Making Amount and/or Complexity of Data Reviewed Labs: ordered. Radiology: ordered.   This patient complains of ***; this involves an extensive number of treatment Options and is a complaint that carries with it a high risk of complications and morbidity. The differential includes ***  I ordered,  reviewed and interpreted labs, which included *** I ordered medication *** and reviewed PMP when indicated. I ordered imaging studies which included *** and I independently    visualized and interpreted imaging which showed *** Additional history obtained from *** Previous records obtained and reviewed *** I consulted *** and discussed lab and imaging findings and discussed disposition.  Cardiac monitoring reviewed, *** Social determinants considered, *** Critical Interventions: ***  After the interventions stated above, I reevaluated the patient and found *** Admission and further  testing considered, ***   {Document critical care time when appropriate:1} {Document review of labs and clinical decision tools ie heart score, Chads2Vasc2 etc:1}  {Document your independent review of radiology images, and any outside records:1} {Document your discussion with family members, caretakers, and with consultants:1} {Document social determinants of health affecting pt's care:1} {Document your decision making why or why not admission, treatments were needed:1} Final Clinical Impression(s) / ED Diagnoses Final diagnoses:  None    Rx / DC Orders ED Discharge Orders     None

## 2023-02-10 NOTE — ED Notes (Signed)
 CCMD called.

## 2023-02-10 NOTE — ED Notes (Signed)
Bgl 281

## 2023-02-10 NOTE — H&P (Incomplete)
PCP:   [name], NP   Chief Complaint:  Fall  HPI: This is a 89 year old female with past medical history of HTN, T2DM, HLD, anxiety, history of CVA.  Patient has been in and out of the ER/hospital since 11/24.  On 11/24 she was in the ER, with diarrhea diagnosed with colitis.  Discharged with Cipro and Flagyl.  Patient had ongoing nausea vomiting despite antibiotics.  Admitted 11/30 - 12/10 with acute on chronic kidney disease..  Creatinine 5, baseline 2.7..  Patient treated with IV fluids and antibiotics, discharged home.  Patient continues to be weak.  Her diarrhea has resolved.  Tonight patient got up to go to the restroom.  Her daughter heard her screaming after she fell.  In the ER vital stable.  Creatinine at baseline 2.3.  CT left hip shows acute mildly impacted and comminuted subcapital neck fracture.  Orthopedics on-call contacted.  Will see patient in AM.  Of note patient is on Plavix.  Last dose this afternoon.  Review of Systems:  Per HPI  Past Medical History: Past Medical History:  Diagnosis Date   Achilles tendon injury 11/27/2010   ALLERGIC RHINITIS 05/12/2006   Qualifier: Diagnosis of  By: Drue Novel MD, Nolon Rod.    Anxiety state 07/03/2009   Qualifier: Diagnosis of  By: Drue Novel MD, Jose E.    Back pain 07/01/2010   Chronic kidney disease (CKD), stage III (moderate) (HCC) 05/31/2014   Chronic right SI joint pain 09/22/2013   Colitis 2024   DEGENERATIVE JOINT DISEASE, CERVICAL SPINE 06/23/2006   Annotation: had a CAT scan with a cervical myelogram that show  left C6-7 and  C5-6 foraminal stenosis Qualifier: Diagnosis of  By: Drue Novel MD, Nolon Rod.    DEPRESSION 05/12/2006   Qualifier: Diagnosis of  By: Drue Novel MD, Jose E.    DIABETES MELLITUS, TYPE II 05/12/2006   Now following w/ Endo at [facility]     DIABETIC  RETINOPATHY 04/27/2007   Qualifier: Diagnosis of  By: Janit Bern     Encephalopathy, hypertensive    Essential hypertension 05/12/2006   Qualifier: Diagnosis of  By:  Drue Novel MD, Nolon Rod.    GAIT DISTURBANCE 08/07/2009   Qualifier: Diagnosis of  By: Drue Novel MD, Nolon Rod.    GERD (gastroesophageal reflux disease)    History of colonoscopy    History of CT scan    History of mammogram    History of MRI    Hyperlipidemia associated with type 2 diabetes mellitus (HCC) 05/12/2006   Qualifier: Diagnosis of  By: Drue Novel MD, Jose E.    ILD (interstitial lung disease) (HCC) 05/31/2015   Lobar cerebral hemorrhage (HCC) 06/22/2019   NECK PAIN, CHRONIC 04/03/2008   Qualifier: Diagnosis of  By: Drue Novel MD, Nolon Rod.    Osteoarthritis 02/10/2016   Osteopenia    Osteoporosis 06/23/2006   Annotation: had a bone density test in 08-2004.  T score was -2.4 Qualifier: Diagnosis of  By: Drue Novel MD, Nolon Rod     [facility] (subarachnoid hemorrhage) (HCC)    Scleroderma (HCC) 02/28/2016   Stroke [facility])    Takotsubo cardiomyopathy    s/p stress MI with stress induced CM with normal coronary arteries by cath 2007 with normalization of LVF by echo 04/2005   TIA (transient ischemic attack)    Vasculitis of skin 09/28/2012   Vitamin D deficiency 10/01/2014   Past Surgical History:  Procedure Laterality Date   ABDOMINAL HYSTERECTOMY  1977   no oophorectomy   APPENDECTOMY  CARDIAC CATHETERIZATION     CATARACT EXTRACTION, BILATERAL  2011   SPINAL FUSION  2000   Dr. Venetia Maxon   TONSILLECTOMY      Medications: Prior to Admission medications   Medication Sig Start Date End Date Taking? Authorizing Provider  acetaminophen (TYLENOL) 500 MG tablet Take 500 mg by mouth every 6 (six) hours as needed for mild pain or headache.    [provider]  amLODipine (NORVASC) 5 MG tablet TAKE 1 TABLET EVERY DAY 02/10/23   Ngetich, Dinah C, NP  atorvastatin (LIPITOR) 80 MG tablet TAKE 1 TABLET EVERY DAY Patient taking differently: Take 80 mg by mouth every evening. 11/26/22   Ngetich, Dinah C, NP  citalopram (CELEXA) 10 MG tablet TAKE 1 TABLET BY MOUTH EVERY DAY Patient taking differently: Take 10 mg by mouth  daily as needed. 12/07/22   Micki Riley, MD  clonazePAM (KLONOPIN) 0.5 MG tablet Take 1 tablet (0.5 mg total) by mouth 2 (two) times daily as needed for anxiety. 11/30/22   Ngetich, Dinah C, NP  clopidogrel (PLAVIX) 75 MG tablet TAKE 1 TABLET EVERY DAY 02/10/23   Ngetich, Dinah C, NP  famotidine (PEPCID) 40 MG tablet TAKE 1 TABLET EVERY DAY 02/05/23   Ngetich, Dinah C, NP  furosemide (LASIX) 20 MG tablet Take 1 tablet (20 mg total) by mouth daily as needed. Take only if weight is +3 lbs greater than baseline weight in 1 day or +5 lbs in 1 week 12/29/22   Carollee Herter, DO  hydrALAZINE (APRESOLINE) 25 MG tablet Take 1 tablet (25 mg total) by mouth 3 (three) times daily. 12/29/22 03/29/23  Carollee Herter, DO  hydrOXYzine (ATARAX) 10 MG tablet Take 1 tablet (10 mg total) by mouth 3 (three) times daily as needed for itching. 01/28/23   Ngetich, Dinah C, NP  metoprolol succinate (TOPROL-XL) 50 MG 24 hr tablet Take 1 tablet (50 mg total) by mouth daily. Take with or immediately following a meal. 07/05/19   Love, Evlyn Kanner, PA-C  NOVOLOG FLEXPEN 100 UNIT/ML FlexPen Inject 6-8 Units into the skin 3 (three) times daily before meals. Pt completes sliding scale. 02/18/21   [provider]  potassium chloride SA (KLOR-CON M) 20 MEQ tablet Take 1 tablet (20 mEq total) by mouth 2 (two) times daily. 01/21/23   Ngetich, Dinah C, NP  sodium bicarbonate 650 MG tablet Take 2 tablets (1,300 mg total) by mouth 2 (two) times daily. 12/29/22 03/29/23  Carollee Herter, DO    Allergies:   Allergies  Allergen Reactions   Cephalexin Nausea And Vomiting    Pt stated made severely sick, will never take again   Pioglitazone Swelling    REACTION: EDEMA   Nintedanib Diarrhea   Sertraline Nausea And Vomiting    Social History:  reports that she has never smoked. She has been exposed to tobacco smoke. She has never used smokeless tobacco. She reports current alcohol use of about 1.0 standard drink of alcohol per week. She reports  that she does not use drugs.  Family History: Family History  Problem Relation Age of Onset   Stroke Mother    Cancer Sister    Lung disease Sister    Intracerebral hemorrhage Daughter        assoc with post-partum   Hypertension Daughter    Breast cancer Other        ?Aunt   Cancer Other        breast?   Coronary artery disease Neg Hx  Physical Exam: Vitals:   02/10/23 1715 02/10/23 1800 02/10/23 2030  BP: (!) 150/94 (!) 141/47 (!) 161/63  Pulse: 78 76 96  Resp: 18 12 15   Temp: 98.2 F (36.8 C)  98.3 F (36.8 C)  TempSrc: Oral  Oral  SpO2: 99% 100% 98%    General: A&O x 3, well developed and nourished, no acute distress Eyes: Pink conjunctiva, no scleral icterus ENT: Moist oral mucosa, neck supple, no thyromegaly Lungs: CTA B/L, no wheeze, no crackles, no use of accessory muscles Cardiovascular: RRR, no regurgitation, no gallops, no murmurs. No carotid bruits, no JVD Abdomen: soft, positive BS, NTND, no organomegaly, not an acute abdomen GU: not examined Neuro: CN II - XII grossly intact Musculoskeletal: LLE flexed and drawn.  Sensation intact.  No cyanosis  Skin: no rash, no subcutaneous crepitation, no decubitus Psych: appropriate patient   Labs on Admission:  Recent Labs    02/10/23 1728  NA 136  K 4.3  CL 104  CO2 24  GLUCOSE 327*  BUN 29*  CREATININE 2.30*  CALCIUM 8.5*    Recent Labs    02/10/23 1728  WBC 6.1  NEUTROABS 3.9  HGB 8.9*  HCT 27.4*  MCV 96.5  PLT 233    Radiological Exams on Admission: CT HIP LEFT WO CONTRAST Result Date: 02/10/2023 CLINICAL DATA:  Fall with left hip pain EXAM: CT OF THE LEFT HIP WITHOUT CONTRAST TECHNIQUE: Multidetector CT imaging of the left hip was performed according to the standard protocol. Multiplanar CT image reconstructions were also generated. RADIATION DOSE REDUCTION: This exam was performed according to the departmental dose-optimization program which includes automated exposure control,  adjustment of the mA and/or kV according to patient size and/or use of iterative reconstruction technique. COMPARISON:  02/10/2023 FINDINGS: Bones/Joint/Cartilage Acute mildly impacted and comminuted sub capital femoral neck fracture with mild apex anterior angulation. No femoral head dislocation. Pubic symphysis and rami appear intact. No SI joint widening. Ligaments Suboptimally assessed by CT. Muscles and Tendons No intramuscular fluid collections.  No significant atrophy Soft tissues Soft tissue edema about the left hip. Vascular calcifications in the pelvis. IMPRESSION: Acute mildly impacted and comminuted subcapital femoral neck fracture with mild apex anterior and medial angulation. Electronically Signed   By: Jasmine Pang M.D.   On: 02/10/2023 21:23   DG Pelvis 1-2 Views Result Date: 02/10/2023 CLINICAL DATA:  Fall EXAM: PELVIS - 1-2 VIEW COMPARISON:  CT 12/13/2022 FINDINGS: Acute left femoral neck fracture. No femoral head dislocation. Pubic symphysis and rami appear intact. Mild to moderate hip degenerative changes. Vascular calcifications IMPRESSION: Acute left femoral neck fracture. Electronically Signed   By: Jasmine Pang M.D.   On: 02/10/2023 19:56   DG Chest 1 View Result Date: 02/10/2023 CLINICAL DATA:  Fall with hip pain EXAM: CHEST  1 VIEW COMPARISON:  12/27/2022 FINDINGS: Hardware in the cervical spine. Low lung volumes. Chronic interstitial opacities. Linear areas of atelectasis or possible scarring at the lung bases. Stable cardiomediastinal silhouette with aortic atherosclerosis. No pneumothorax IMPRESSION: Low lung volumes with chronic interstitial opacities and linear areas of atelectasis or possible scarring at the lung bases. Electronically Signed   By: Jasmine Pang M.D.   On: 02/10/2023 19:55   DG Knee 1-2 Views Left Result Date: 02/10/2023 CLINICAL DATA:  Fall with pain EXAM: LEFT KNEE - 1-2 VIEW COMPARISON:  None Available. FINDINGS: Limited by positioning. Sclerosis in the  distal shaft and metaphysis of the femur suggestive of an infarct. No acute fracture or malalignment.  No sizeable effusion IMPRESSION: 1. No acute osseous abnormality. Electronically Signed   By: Jasmine Pang M.D.   On: 02/10/2023 19:54   DG FEMUR MIN 2 VIEWS LEFT Result Date: 02/10/2023 CLINICAL DATA:  Fall left hip and sacral pain EXAM: LEFT FEMUR 2 VIEWS COMPARISON:  None Available. FINDINGS: Sclerosis in the distal shaft of the femur which may be due to infarct. Acute left femoral neck fracture. No femoral head dislocation. Vascular calcifications. IMPRESSION: Acute left femoral neck fracture. Electronically Signed   By: Jasmine Pang M.D.   On: 02/10/2023 19:53   CT HEAD WO CONTRAST Result Date: 02/10/2023 CLINICAL DATA:  Head and neck trauma from fall EXAM: CT HEAD WITHOUT CONTRAST CT CERVICAL SPINE WITHOUT CONTRAST TECHNIQUE: Multidetector CT imaging of the head and cervical spine was performed following the standard protocol without intravenous contrast. Multiplanar CT image reconstructions of the cervical spine were also generated. RADIATION DOSE REDUCTION: This exam was performed according to the departmental dose-optimization program which includes automated exposure control, adjustment of the mA and/or kV according to patient size and/or use of iterative reconstruction technique. COMPARISON:  CT and MRI 12/28/2022 and CT 11/15/2017 FINDINGS: CT HEAD FINDINGS Brain: No intracranial hemorrhage, mass effect, or evidence of acute infarct. No hydrocephalus. No extra-axial fluid collection. Age related cerebral atrophy and chronic small vessel ischemic disease. Chronic right occipital infarct. Vascular: No hyperdense vessel. Intracranial arterial calcification. Skull: No fracture or focal lesion. Sinuses/Orbits: No acute finding. Other: None. CT CERVICAL SPINE FINDINGS Alignment: No evidence of traumatic malalignment. Skull base and vertebrae: No acute fracture. No primary bone lesion or focal  pathologic process. Soft tissues and spinal canal: No prevertebral fluid or swelling. No visible canal hematoma. Disc levels: Unchanged multilevel spondylosis greatest at C5-C6. ACDF C6-C7. Mild spinal canal narrowing at C3-C4, C5-C6 and C6-C7 secondary to posterior disc osteophyte complexes. No severe spinal canal narrowing. Multilevel facet arthropathy. Upper chest: No acute abnormality. Other: Carotid calcification. IMPRESSION: 1. No acute intracranial abnormality. 2. No acute fracture in the cervical spine. Electronically Signed   By: Minerva Fester M.D.   On: 02/10/2023 19:47   CT CERVICAL SPINE WO CONTRAST Result Date: 02/10/2023 CLINICAL DATA:  Head and neck trauma from fall EXAM: CT HEAD WITHOUT CONTRAST CT CERVICAL SPINE WITHOUT CONTRAST TECHNIQUE: Multidetector CT imaging of the head and cervical spine was performed following the standard protocol without intravenous contrast. Multiplanar CT image reconstructions of the cervical spine were also generated. RADIATION DOSE REDUCTION: This exam was performed according to the departmental dose-optimization program which includes automated exposure control, adjustment of the mA and/or kV according to patient size and/or use of iterative reconstruction technique. COMPARISON:  CT and MRI 12/28/2022 and CT 11/15/2017 FINDINGS: CT HEAD FINDINGS Brain: No intracranial hemorrhage, mass effect, or evidence of acute infarct. No hydrocephalus. No extra-axial fluid collection. Age related cerebral atrophy and chronic small vessel ischemic disease. Chronic right occipital infarct. Vascular: No hyperdense vessel. Intracranial arterial calcification. Skull: No fracture or focal lesion. Sinuses/Orbits: No acute finding. Other: None. CT CERVICAL SPINE FINDINGS Alignment: No evidence of traumatic malalignment. Skull base and vertebrae: No acute fracture. No primary bone lesion or focal pathologic process. Soft tissues and spinal canal: No prevertebral fluid or swelling. No  visible canal hematoma. Disc levels: Unchanged multilevel spondylosis greatest at C5-C6. ACDF C6-C7. Mild spinal canal narrowing at C3-C4, C5-C6 and C6-C7 secondary to posterior disc osteophyte complexes. No severe spinal canal narrowing. Multilevel facet arthropathy. Upper chest: No acute abnormality. Other: Carotid calcification. IMPRESSION:  1. No acute intracranial abnormality. 2. No acute fracture in the cervical spine. Electronically Signed   By: Minerva Fester M.D.   On: 02/10/2023 19:47    Assessment/Plan Present on Admission:  Subcapital fracture of hip, left, closed, initial encounter (HCC) -N.p.o., IV fluid hydration -IV Dilaudid as needed -Orthopedics on-call consulted and aware Dr Steward Drone   Anxiety state -Celexa resumed -Klonopin as needed resumed   History of CVA -Hold plavix   Benign essential HTN -Hydralazine resumed. -Metoprolol held with patient's borderline BP   Diabetes mellitus type 2 with retinopathy (HCC) -Sliding scale insulin initiated   Dyslipidemia -Atorvastatin resumed   Parisa Pinela 02/10/2023, 9:35 PM

## 2023-02-10 NOTE — ED Notes (Signed)
Patient desatting to 83% on room air s/p dilaudid admin - placed on 2L [place] with sats trending up to 99%

## 2023-02-11 ENCOUNTER — Inpatient Hospital Stay (HOSPITAL_COMMUNITY): Payer: Medicare HMO

## 2023-02-11 ENCOUNTER — Encounter (HOSPITAL_COMMUNITY)
Admission: EM | Disposition: A | Discharge status: Skilled Nursing Facility | Payer: Self-pay | Source: Home / Self Care | Attending: Internal Medicine

## 2023-02-11 ENCOUNTER — Other Ambulatory Visit: Payer: Self-pay

## 2023-02-11 ENCOUNTER — Encounter (HOSPITAL_COMMUNITY): Payer: Self-pay | Admitting: Family Medicine

## 2023-02-11 ENCOUNTER — Other Ambulatory Visit: Payer: Medicare HMO | Admitting: Gastroenterology

## 2023-02-11 ENCOUNTER — Inpatient Hospital Stay (HOSPITAL_COMMUNITY): Payer: Medicare HMO | Admitting: Anesthesiology

## 2023-02-11 DIAGNOSIS — I2510 Atherosclerotic heart disease of native coronary artery without angina pectoris: Secondary | ICD-10-CM

## 2023-02-11 DIAGNOSIS — I120 Hypertensive chronic kidney disease with stage 5 chronic kidney disease or end stage renal disease: Secondary | ICD-10-CM

## 2023-02-11 DIAGNOSIS — N185 Chronic kidney disease, stage 5: Secondary | ICD-10-CM

## 2023-02-11 DIAGNOSIS — S72002A Fracture of unspecified part of neck of left femur, initial encounter for closed fracture: Secondary | ICD-10-CM | POA: Diagnosis not present

## 2023-02-11 DIAGNOSIS — S72012A Unspecified intracapsular fracture of left femur, initial encounter for closed fracture: Secondary | ICD-10-CM | POA: Diagnosis not present

## 2023-02-11 HISTORY — PX: ANTERIOR APPROACH HEMI HIP ARTHROPLASTY: SHX6690

## 2023-02-11 LAB — BASIC METABOLIC PANEL
Anion gap: 11 (ref 5–15)
BUN: 26 mg/dL — ABNORMAL HIGH (ref 8–23)
CO2: 20 mmol/L — ABNORMAL LOW (ref 22–32)
Calcium: 8.2 mg/dL — ABNORMAL LOW (ref 8.9–10.3)
Chloride: 106 mmol/L (ref 98–111)
Creatinine, Ser: 2.15 mg/dL — ABNORMAL HIGH (ref 0.44–1.00)
GFR, Estimated: 22 mL/min — ABNORMAL LOW (ref >60)
Glucose, Bld: 287 mg/dL — ABNORMAL HIGH (ref 70–99)
Potassium: 4.6 mmol/L (ref 3.5–5.1)
Sodium: 137 mmol/L (ref 135–145)

## 2023-02-11 LAB — CBC WITH DIFFERENTIAL/PLATELET
Abs Immature Granulocytes: 0.03 10*3/uL (ref 0.00–0.07)
Basophils Absolute: 0 10*3/uL (ref 0.0–0.1)
Basophils Relative: 0 %
Eosinophils Absolute: 0 10*3/uL (ref 0.0–0.5)
Eosinophils Relative: 0 %
HCT: 27 % — ABNORMAL LOW (ref 36.0–46.0)
Hemoglobin: 8.7 g/dL — ABNORMAL LOW (ref 12.0–15.0)
Immature Granulocytes: 0 %
Lymphocytes Relative: 12 %
Lymphs Abs: 0.9 10*3/uL (ref 0.7–4.0)
MCH: 31.4 pg (ref 26.0–34.0)
MCHC: 32.2 g/dL (ref 30.0–36.0)
MCV: 97.5 fL (ref 80.0–100.0)
Monocytes Absolute: 0.4 10*3/uL (ref 0.1–1.0)
Monocytes Relative: 6 %
Neutro Abs: 6 10*3/uL (ref 1.7–7.7)
Neutrophils Relative %: 82 %
Platelets: 223 10*3/uL (ref 150–400)
RBC: 2.77 MIL/uL — ABNORMAL LOW (ref 3.87–5.11)
RDW: 12.9 % (ref 11.5–15.5)
WBC: 7.4 10*3/uL (ref 4.0–10.5)
nRBC: 0 % (ref 0.0–0.2)

## 2023-02-11 LAB — SURGICAL PCR SCREEN
MRSA, PCR: NEGATIVE
Staphylococcus aureus: NEGATIVE

## 2023-02-11 LAB — CBG MONITORING, ED
Glucose-Capillary: 266 mg/dL — ABNORMAL HIGH (ref 70–99)
Glucose-Capillary: 234 mg/dL — ABNORMAL HIGH (ref 70–99)
Glucose-Capillary: 119 mg/dL — ABNORMAL HIGH (ref 70–99)

## 2023-02-11 LAB — GLUCOSE, CAPILLARY
Glucose-Capillary: 105 mg/dL — ABNORMAL HIGH (ref 70–99)
Glucose-Capillary: 165 mg/dL — ABNORMAL HIGH (ref 70–99)
Glucose-Capillary: 235 mg/dL — ABNORMAL HIGH (ref 70–99)
Glucose-Capillary: 289 mg/dL — ABNORMAL HIGH (ref 70–99)

## 2023-02-11 SURGERY — HEMIARTHROPLASTY, HIP, DIRECT ANTERIOR APPROACH, FOR FRACTURE
Anesthesia: General | Site: Hip | Laterality: Left

## 2023-02-11 MED ORDER — PHENYLEPHRINE 80 MCG/ML (10ML) SYRINGE FOR IV PUSH (FOR BLOOD PRESSURE SUPPORT)
PREFILLED_SYRINGE | INTRAVENOUS | Status: DC | PRN
  Administered 2023-02-11: 80 ug via INTRAVENOUS
  Administered 2023-02-11 (×3): 160 ug via INTRAVENOUS

## 2023-02-11 MED ORDER — ACETAMINOPHEN 325 MG PO TABS: 325.0000 mg | ORAL_TABLET | Freq: Four times a day (QID) | ORAL | Status: DC | PRN

## 2023-02-11 MED ORDER — SUGAMMADEX SODIUM 200 MG/2ML IV SOLN
INTRAVENOUS | Status: DC | PRN
  Administered 2023-02-11: 100 mg via INTRAVENOUS

## 2023-02-11 MED ORDER — METHOCARBAMOL 1000 MG/10ML IJ SOLN
500.0000 mg | Freq: Four times a day (QID) | INTRAMUSCULAR | Status: DC | PRN
Start: 2023-02-11 — End: 2023-02-18
  Administered 2023-02-13: 500 mg via INTRAVENOUS
  Filled 2023-02-11: qty 10

## 2023-02-11 MED ORDER — 0.9 % SODIUM CHLORIDE (POUR BTL) OPTIME
TOPICAL | Status: DC | PRN
  Administered 2023-02-11: 1000 mL

## 2023-02-11 MED ORDER — LIDOCAINE 2% (20 MG/ML) 5 ML SYRINGE
INTRAMUSCULAR | Status: AC
  Filled 2023-02-11: qty 5

## 2023-02-11 MED ORDER — CHLORHEXIDINE GLUCONATE 0.12 % MT SOLN: 15.0000 mL | Freq: Once | OROMUCOSAL | Status: AC

## 2023-02-11 MED ORDER — CLOPIDOGREL BISULFATE 75 MG PO TABS
75.0000 mg | ORAL_TABLET | Freq: Every day | ORAL | Status: DC
  Administered 2023-02-12 – 2023-02-18 (×7): 75 mg via ORAL
  Filled 2023-02-11 (×7): qty 1

## 2023-02-11 MED ORDER — PHENOL 1.4 % MT LIQD: 1.0000 | OROMUCOSAL | Status: DC | PRN

## 2023-02-11 MED ORDER — CEFAZOLIN SODIUM-DEXTROSE 2-4 GM/100ML-% IV SOLN
2.0000 g | Freq: Once | INTRAVENOUS | Status: AC
  Administered 2023-02-11: 2 g via INTRAVENOUS

## 2023-02-11 MED ORDER — METOCLOPRAMIDE HCL 5 MG PO TABS: 5.0000 mg | ORAL_TABLET | Freq: Three times a day (TID) | ORAL | Status: DC | PRN

## 2023-02-11 MED ORDER — DEXAMETHASONE SODIUM PHOSPHATE 10 MG/ML IJ SOLN
INTRAMUSCULAR | Status: AC
  Filled 2023-02-11: qty 1

## 2023-02-11 MED ORDER — ROCURONIUM BROMIDE 10 MG/ML (PF) SYRINGE
PREFILLED_SYRINGE | INTRAVENOUS | Status: DC | PRN
  Administered 2023-02-11: 40 mg via INTRAVENOUS

## 2023-02-11 MED ORDER — CEFAZOLIN SODIUM-DEXTROSE 2-4 GM/100ML-% IV SOLN
INTRAVENOUS | Status: AC
  Filled 2023-02-11: qty 100

## 2023-02-11 MED ORDER — METOCLOPRAMIDE HCL 5 MG/ML IJ SOLN: 5.0000 mg | Freq: Three times a day (TID) | INTRAMUSCULAR | Status: DC | PRN

## 2023-02-11 MED ORDER — HYDROCODONE-ACETAMINOPHEN 7.5-325 MG PO TABS: 1.0000 | ORAL_TABLET | ORAL | Status: DC | PRN

## 2023-02-11 MED ORDER — CHLORHEXIDINE GLUCONATE 0.12 % MT SOLN
OROMUCOSAL | Status: AC
  Administered 2023-02-11: 15 mL via OROMUCOSAL
  Filled 2023-02-11: qty 15

## 2023-02-11 MED ORDER — ONDANSETRON HCL 4 MG/2ML IJ SOLN
INTRAMUSCULAR | Status: DC | PRN
  Administered 2023-02-11: 4 mg via INTRAVENOUS

## 2023-02-11 MED ORDER — ALUM & MAG HYDROXIDE-SIMETH 200-200-20 MG/5ML PO SUSP: 30.0000 mL | ORAL | Status: DC | PRN

## 2023-02-11 MED ORDER — ENSURE ENLIVE PO LIQD
237.0000 mL | Freq: Two times a day (BID) | ORAL | Status: DC
Start: 2023-02-12 — End: 2023-02-18
  Administered 2023-02-12 – 2023-02-16 (×2): 237 mL via ORAL

## 2023-02-11 MED ORDER — ASPIRIN 325 MG PO TBEC
325.0000 mg | DELAYED_RELEASE_TABLET | Freq: Every day | ORAL | Status: DC
  Administered 2023-02-12 – 2023-02-17 (×6): 325 mg via ORAL
  Filled 2023-02-11 (×6): qty 1

## 2023-02-11 MED ORDER — FENTANYL CITRATE (PF) 250 MCG/5ML IJ SOLN
INTRAMUSCULAR | Status: AC
  Filled 2023-02-11: qty 5

## 2023-02-11 MED ORDER — HALOPERIDOL LACTATE 5 MG/ML IJ SOLN
2.0000 mg | Freq: Four times a day (QID) | INTRAMUSCULAR | Status: DC | PRN
  Administered 2023-02-11 – 2023-02-12 (×2): 2 mg via INTRAMUSCULAR
  Filled 2023-02-11 (×2): qty 1

## 2023-02-11 MED ORDER — ROCURONIUM BROMIDE 10 MG/ML (PF) SYRINGE
PREFILLED_SYRINGE | INTRAVENOUS | Status: AC
  Filled 2023-02-11: qty 10

## 2023-02-11 MED ORDER — SODIUM CHLORIDE 0.9 % IV SOLN
INTRAVENOUS | Status: DC
Start: 2023-02-11 — End: 2023-02-12

## 2023-02-11 MED ORDER — HYDROCODONE-ACETAMINOPHEN 5-325 MG PO TABS
1.0000 | ORAL_TABLET | ORAL | Status: DC | PRN
  Administered 2023-02-11 – 2023-02-12 (×2): 2 via ORAL
  Filled 2023-02-11 (×2): qty 2

## 2023-02-11 MED ORDER — FAMOTIDINE 20 MG PO TABS
40.0000 mg | ORAL_TABLET | Freq: Every day | ORAL | Status: DC
  Administered 2023-02-11 – 2023-02-12 (×2): 40 mg via ORAL
  Filled 2023-02-11 (×2): qty 2

## 2023-02-11 MED ORDER — DEXAMETHASONE SODIUM PHOSPHATE 10 MG/ML IJ SOLN
INTRAMUSCULAR | Status: DC | PRN
  Administered 2023-02-11: 5 mg via INTRAVENOUS

## 2023-02-11 MED ORDER — PHENYLEPHRINE HCL-NACL 20-0.9 MG/250ML-% IV SOLN
INTRAVENOUS | Status: AC
Start: 2023-02-11 — End: ?
  Filled 2023-02-11: qty 250

## 2023-02-11 MED ORDER — DOCUSATE SODIUM 100 MG PO CAPS
100.0000 mg | ORAL_CAPSULE | Freq: Two times a day (BID) | ORAL | Status: DC
  Administered 2023-02-11 – 2023-02-17 (×8): 100 mg via ORAL
  Filled 2023-02-11 (×12): qty 1

## 2023-02-11 MED ORDER — METHOCARBAMOL 500 MG PO TABS
500.0000 mg | ORAL_TABLET | Freq: Four times a day (QID) | ORAL | Status: DC | PRN
Start: 2023-02-11 — End: 2023-02-18
  Administered 2023-02-15 – 2023-02-17 (×2): 500 mg via ORAL
  Filled 2023-02-11 (×2): qty 1

## 2023-02-11 MED ORDER — PROPOFOL 10 MG/ML IV BOLUS
INTRAVENOUS | Status: DC | PRN
  Administered 2023-02-11: 40 mg via INTRAVENOUS

## 2023-02-11 MED ORDER — ONDANSETRON HCL 4 MG PO TABS
4.0000 mg | ORAL_TABLET | Freq: Four times a day (QID) | ORAL | Status: DC | PRN
Start: 2023-02-11 — End: 2023-02-18

## 2023-02-11 MED ORDER — CEFAZOLIN SODIUM-DEXTROSE 2-4 GM/100ML-% IV SOLN
2.0000 g | Freq: Four times a day (QID) | INTRAVENOUS | Status: AC
  Administered 2023-02-11 (×2): 2 g via INTRAVENOUS
  Filled 2023-02-11 (×2): qty 100

## 2023-02-11 MED ORDER — SODIUM CHLORIDE 0.9 % IV SOLN: INTRAVENOUS | Status: DC | PRN

## 2023-02-11 MED ORDER — LIDOCAINE 2% (20 MG/ML) 5 ML SYRINGE
INTRAMUSCULAR | Status: DC | PRN
  Administered 2023-02-11: 30 mg via INTRAVENOUS

## 2023-02-11 MED ORDER — DIPHENHYDRAMINE HCL 12.5 MG/5ML PO ELIX
12.5000 mg | ORAL_SOLUTION | ORAL | Status: DC | PRN
Start: 2023-02-11 — End: 2023-02-18

## 2023-02-11 MED ORDER — ONDANSETRON HCL 4 MG/2ML IJ SOLN
4.0000 mg | Freq: Four times a day (QID) | INTRAMUSCULAR | Status: DC | PRN
  Filled 2023-02-11: qty 2

## 2023-02-11 MED ORDER — FENTANYL CITRATE (PF) 250 MCG/5ML IJ SOLN
INTRAMUSCULAR | Status: DC | PRN
  Administered 2023-02-11 (×3): 25 ug via INTRAVENOUS
  Administered 2023-02-11: 50 ug via INTRAVENOUS
  Administered 2023-02-11: 25 ug via INTRAVENOUS

## 2023-02-11 MED ORDER — ORAL CARE MOUTH RINSE
15.0000 mL | Freq: Once | OROMUCOSAL | Status: AC
Start: 2023-02-11 — End: 2023-02-11

## 2023-02-11 MED ORDER — MENTHOL 3 MG MT LOZG: 1.0000 | LOZENGE | OROMUCOSAL | Status: DC | PRN

## 2023-02-11 MED ORDER — PROPOFOL 10 MG/ML IV BOLUS
INTRAVENOUS | Status: AC
  Filled 2023-02-11: qty 20

## 2023-02-11 MED ORDER — MORPHINE SULFATE (PF) 2 MG/ML IV SOLN: 0.5000 mg | INTRAVENOUS | Status: DC | PRN

## 2023-02-11 SURGICAL SUPPLY — 35 items
BAG COUNTER SPONGE SURGICOUNT (BAG) ×1 IMPLANT
BIPOLAR PROS AML 44 (Hips) ×1 IMPLANT
BLADE SAW SGTL 73X25 THK (BLADE) ×1 IMPLANT
COVER SURGICAL LIGHT HANDLE (MISCELLANEOUS) ×1 IMPLANT
DRAPE HIP W/POCKET STRL (MISCELLANEOUS) ×1 IMPLANT
DRAPE IMP U-DRAPE 54X76 (DRAPES) ×1 IMPLANT
DRAPE INCISE IOBAN 85X60 (DRAPES) ×2 IMPLANT
DRAPE U-SHAPE 47X51 STRL (DRAPES) ×1 IMPLANT
DRSG MEPILEX POST OP 4X12 (GAUZE/BANDAGES/DRESSINGS) IMPLANT
DURAPREP 26ML APPLICATOR (WOUND CARE) ×1 IMPLANT
ELECT CAUTERY BLADE 6.4 (BLADE) ×1 IMPLANT
ELECT REM PT RETURN 9FT ADLT (ELECTROSURGICAL) ×1 IMPLANT
ELECTRODE REM PT RTRN 9FT ADLT (ELECTROSURGICAL) ×1 IMPLANT
GLOVE BIO SURGEON STRL SZ8 (GLOVE) ×1 IMPLANT
GLOVE BIOGEL PI IND STRL 8 (GLOVE) ×1 IMPLANT
GLOVE ORTHO TXT STRL SZ7.5 (GLOVE) ×1 IMPLANT
GOWN STRL REUS W/ TWL LRG LVL3 (GOWN DISPOSABLE) ×2 IMPLANT
GOWN STRL REUS W/ TWL XL LVL3 (GOWN DISPOSABLE) ×4 IMPLANT
HEAD BIPOLAR PROS AML 44 (Hips) IMPLANT
HEAD FEM STD 28X+1.5 STRL (Hips) IMPLANT
KIT BASIN OR (CUSTOM PROCEDURE TRAY) ×1 IMPLANT
KIT TURNOVER KIT B (KITS) ×1 IMPLANT
MANIFOLD NEPTUNE II (INSTRUMENTS) ×1 IMPLANT
NS IRRIG 1000ML POUR BTL (IV SOLUTION) ×1 IMPLANT
PACK TOTAL JOINT (CUSTOM PROCEDURE TRAY) ×1 IMPLANT
PACK UNIVERSAL I (CUSTOM PROCEDURE TRAY) ×1 IMPLANT
PAD ARMBOARD 7.5X6 YLW CONV (MISCELLANEOUS) ×2 IMPLANT
STEM FEM ACTIS HIGH SZ3 (Stem) IMPLANT
SUT VIC AB 0 CT1 27XBRD ANBCTR (SUTURE) ×2 IMPLANT
SUT VIC AB 1 CTB1 27 (SUTURE) ×2 IMPLANT
SUT VIC AB 2-0 CT1 TAPERPNT 27 (SUTURE) ×2 IMPLANT
SUT VICRYL 4-0 PS2 18IN ABS (SUTURE) IMPLANT
TOWEL GREEN STERILE (TOWEL DISPOSABLE) ×1 IMPLANT
TOWEL GREEN STERILE FF (TOWEL DISPOSABLE) ×1 IMPLANT
WATER STERILE IRR 1000ML POUR (IV SOLUTION) ×3 IMPLANT

## 2023-02-11 NOTE — Consult Note (Signed)
Reason for Consult: Left hip fracture Referring Physician: Dr. Benjamine Mola   HPI: Patient is a 89 year old female with past medical history of chronic kidney disease, hypertension, type 2 diabetes, anxiety and history of CVA.  Patient reports on 02/11/2023 she got up to go to the bathroom and had a fall.  She describes everything as "flying.  Her daughter who lives with her found her screaming.  She was brought to the ER and found to have a left hip fracture.  Patient on Plavix which was reported to be taken last on 1/22 in the morning.   Past Medical History:  Diagnosis Date   Achilles tendon injury 11/27/2010   ALLERGIC RHINITIS 05/12/2006   Qualifier: Diagnosis of  By: Drue Novel MD, Nolon Rod.    Anxiety state 07/03/2009   Qualifier: Diagnosis of  By: Drue Novel MD, Jose E.    Back pain 07/01/2010   Chronic kidney disease (CKD), stage III (moderate) (HCC) 05/31/2014   Chronic right SI joint pain 09/22/2013   Colitis 2024   DEGENERATIVE JOINT DISEASE, CERVICAL SPINE 06/23/2006   Annotation: had a CAT scan with a cervical myelogram that show  left C6-7 and  C5-6 foraminal stenosis Qualifier: Diagnosis of  By: Drue Novel MD, Nolon Rod.    DEPRESSION 05/12/2006   Qualifier: Diagnosis of  By: Drue Novel MD, Jose E.    DIABETES MELLITUS, TYPE II 05/12/2006   Now following w/ Endo at [facility]     DIABETIC  RETINOPATHY 04/27/2007   Qualifier: Diagnosis of  By: Janit Bern     Encephalopathy, hypertensive    Essential hypertension 05/12/2006   Qualifier: Diagnosis of  By: Drue Novel MD, Nolon Rod.    GAIT DISTURBANCE 08/07/2009   Qualifier: Diagnosis of  By: Drue Novel MD, Nolon Rod.    GERD (gastroesophageal reflux disease)    History of colonoscopy    History of CT scan    History of mammogram    History of MRI    Hyperlipidemia associated with type 2 diabetes mellitus (HCC) 05/12/2006   Qualifier: Diagnosis of  By: Drue Novel MD, Jose E.    ILD (interstitial lung disease) (HCC) 05/31/2015   Lobar cerebral hemorrhage (HCC) 06/22/2019   NECK  PAIN, CHRONIC 04/03/2008   Qualifier: Diagnosis of  By: Drue Novel MD, Nolon Rod.    Osteoarthritis 02/10/2016   Osteopenia    Osteoporosis 06/23/2006   Annotation: had a bone density test in 08-2004.  T score was -2.4 Qualifier: Diagnosis of  By: Drue Novel MD, Nolon Rod     [facility] (subarachnoid hemorrhage) (HCC)    Scleroderma (HCC) 02/28/2016   Stroke [facility])    Takotsubo cardiomyopathy    s/p stress MI with stress induced CM with normal coronary arteries by cath 2007 with normalization of LVF by echo 04/2005   TIA (transient ischemic attack)    Vasculitis of skin 09/28/2012   Vitamin D deficiency 10/01/2014    Past Surgical History:  Procedure Laterality Date   ABDOMINAL HYSTERECTOMY  1977   no oophorectomy   APPENDECTOMY     CARDIAC CATHETERIZATION     CATARACT EXTRACTION, BILATERAL  2011   SPINAL FUSION  2000   Dr. Venetia Maxon   TONSILLECTOMY      Family History  Problem Relation Age of Onset   Stroke Mother    Cancer Sister    Lung disease Sister    Intracerebral hemorrhage Daughter        assoc with post-partum   Hypertension Daughter    Breast cancer Other        ?  Aunt   Cancer Other        breast?   Coronary artery disease Neg Hx     Social History:  reports that she has never smoked. She has been exposed to tobacco smoke. She has never used smokeless tobacco. She reports current alcohol use of about 1.0 standard drink of alcohol per week. She reports that she does not use drugs.  Allergies:  Allergies  Allergen Reactions   Cephalexin Nausea And Vomiting    Pt stated made severely sick, will never take again   Pioglitazone Swelling    REACTION: EDEMA   Nintedanib Diarrhea   Sertraline Nausea And Vomiting    Medications: I have reviewed the patient's current medications.  Results for orders placed or performed during the hospital encounter of 02/10/23 (from the past 48 hours)  Basic metabolic panel     Status: Abnormal   Collection Time: 02/10/23  5:28 PM  Result Value Ref Range    Sodium 136 135 - 145 mmol/L   Potassium 4.3 3.5 - 5.1 mmol/L   Chloride 104 98 - 111 mmol/L   CO2 24 22 - 32 mmol/L   Glucose, Bld 327 (H) 70 - 99 mg/dL    Comment: Glucose reference range applies only to samples taken after fasting for at least 8 hours.   BUN 29 (H) 8 - 23 mg/dL   Creatinine, Ser 1.47 (H) 0.44 - 1.00 mg/dL   Calcium 8.5 (L) 8.9 - 10.3 mg/dL   GFR, Estimated 20 (L) >60 mL/min    Comment: (NOTE) Calculated using the CKD-EPI Creatinine Equation (2021)    Anion gap 8 5 - 15    Comment: Performed at [facility], [address], [place], [place] [address]  CBC with Differential     Status: Abnormal   Collection Time: 02/10/23  5:28 PM  Result Value Ref Range   WBC 6.1 4.0 - 10.5 K/uL   RBC 2.84 (L) 3.87 - 5.11 MIL/uL   Hemoglobin 8.9 (L) 12.0 - 15.0 g/dL   HCT 21.3 (L) 08.6 - 57.8 %   MCV 96.5 80.0 - 100.0 fL   MCH 31.3 26.0 - 34.0 pg   MCHC 32.5 30.0 - 36.0 g/dL   RDW 46.9 62.9 - 52.8 %   Platelets 233 150 - 400 K/uL   nRBC 0.0 0.0 - 0.2 %   Neutrophils Relative % 64 %   Neutro Abs 3.9 1.7 - 7.7 K/uL   Lymphocytes Relative 22 %   Lymphs Abs 1.3 0.7 - 4.0 K/uL   Monocytes Relative 7 %   Monocytes Absolute 0.4 0.1 - 1.0 K/uL   Eosinophils Relative 6 %   Eosinophils Absolute 0.4 0.0 - 0.5 K/uL   Basophils Relative 1 %   Basophils Absolute 0.0 0.0 - 0.1 K/uL   Immature Granulocytes 0 %   Abs Immature Granulocytes 0.02 0.00 - 0.07 K/uL    Comment: Performed at [facility], [address] [place], [place] [address]  Protime-INR     Status: None   Collection Time: 02/10/23  5:28 PM  Result Value Ref Range   Prothrombin Time 13.6 11.4 - 15.2 seconds   INR 1.0 0.8 - 1.2    Comment: (NOTE) INR goal varies based on device and disease states. Performed at [facility], [address], [place], [place] [address]   Type and screen [facility]     Status: None   Collection Time: 02/10/23  5:30 PM  Result Value Ref Range    ABO/RH(D) O NEG    Antibody Screen NEG    Sample Expiration      02/13/2023,2359 Performed at [facility], [address] [place], [place] [address]   CBG monitoring, ED     Status: Abnormal   Collection Time: 02/10/23 11:26 PM  Result Value Ref Range   Glucose-Capillary 281 (H) 70 - 99 mg/dL    Comment: Glucose reference range applies only to samples taken after fasting for at least 8 hours.  Creatinine, serum     Status: Abnormal   Collection Time: 02/10/23 11:27 PM  Result Value Ref Range   Creatinine, Ser 2.22 (H) 0.44 - 1.00 mg/dL   GFR, Estimated 21 (L) >60 mL/min    Comment: (NOTE) Calculated using the CKD-EPI Creatinine Equation (2021) Performed at [facility], [address], [place], [place] [address]   CBG monitoring, ED     Status: Abnormal   Collection Time: 02/11/23  4:17 AM  Result Value Ref Range   Glucose-Capillary 266 (H) 70 - 99 mg/dL    Comment: Glucose reference range applies only to samples taken after fasting for at least 8 hours.  CBC with Differential/Platelet     Status: Abnormal   Collection Time: 02/11/23  4:19 AM  Result Value Ref Range   WBC 7.4 4.0 - 10.5 K/uL   RBC 2.77 (L) 3.87 - 5.11 MIL/uL   Hemoglobin 8.7 (L) 12.0 - 15.0 g/dL   HCT 46.9 (L) 62.9 - 52.8 %   MCV 97.5 80.0 - 100.0 fL   MCH 31.4 26.0 - 34.0 pg   MCHC 32.2 30.0 - 36.0 g/dL   RDW 41.3 24.4 - 01.0 %   Platelets 223 150 - 400 K/uL   nRBC 0.0 0.0 - 0.2 %   Neutrophils Relative % 82 %   Neutro Abs 6.0 1.7 - 7.7 K/uL   Lymphocytes Relative 12 %   Lymphs Abs 0.9 0.7 - 4.0 K/uL   Monocytes Relative 6 %   Monocytes Absolute 0.4 0.1 - 1.0 K/uL   Eosinophils Relative 0 %   Eosinophils Absolute 0.0 0.0 - 0.5 K/uL   Basophils Relative 0 %   Basophils Absolute 0.0 0.0 - 0.1 K/uL   Immature Granulocytes 0 %   Abs Immature Granulocytes 0.03 0.00 - 0.07 K/uL    Comment: Performed at [facility], [address], [place], [place] [address]  Basic metabolic panel      Status: Abnormal   Collection Time: 02/11/23  4:19 AM  Result Value Ref Range   Sodium 137 135 - 145 mmol/L   Potassium 4.6 3.5 - 5.1 mmol/L   Chloride 106 98 - 111 mmol/L   CO2 20 (L) 22 - 32 mmol/L   Glucose, Bld 287 (H) 70 - 99 mg/dL    Comment: Glucose reference range applies only to samples taken after fasting for at least 8 hours.   BUN 26 (H) 8 - 23 mg/dL   Creatinine, Ser 6.64 (H) 0.44 - 1.00 mg/dL   Calcium 8.2 (L) 8.9 - 10.3 mg/dL   GFR, Estimated 22 (L) >60 mL/min    Comment: (NOTE) Calculated using the CKD-EPI Creatinine Equation (2021)    Anion gap 11 5 - 15    Comment: Performed at [facility], [address] [place], [place] [address]  CBG monitoring, ED     Status: Abnormal   Collection Time: 02/11/23  8:04 AM  Result Value Ref Range   Glucose-Capillary 234 (H) 70 - 99 mg/dL    Comment: Glucose reference range applies only to samples taken after fasting for at least 8 hours.  CBG monitoring, ED     Status: Abnormal   Collection Time: 02/11/23 10:38 AM  Result Value Ref Range   Glucose-Capillary 119 (H) 70 - 99 mg/dL    Comment: Glucose reference range applies only to samples taken after fasting for at least 8 hours.  Glucose, capillary     Status: Abnormal   Collection Time: 02/11/23 11:01 AM  Result Value Ref Range   Glucose-Capillary 105 (H) 70 - 99 mg/dL    Comment: Glucose reference range applies only to samples taken after fasting for at least 8 hours.    CT HIP LEFT WO CONTRAST Result Date: 02/10/2023 CLINICAL DATA:  Fall with left hip pain EXAM: CT OF THE LEFT HIP WITHOUT CONTRAST TECHNIQUE: Multidetector CT imaging of the left hip was performed according to the standard protocol. Multiplanar CT image reconstructions were also generated. RADIATION DOSE REDUCTION: This exam was performed according to the departmental dose-optimization program which includes automated exposure control, adjustment of the mA and/or kV according to patient size and/or  use of iterative reconstruction technique. COMPARISON:  02/10/2023 FINDINGS: Bones/Joint/Cartilage Acute mildly impacted and comminuted sub capital femoral neck fracture with mild apex anterior angulation. No femoral head dislocation. Pubic symphysis and rami appear intact. No SI joint widening. Ligaments Suboptimally assessed by CT. Muscles and Tendons No intramuscular fluid collections.  No significant atrophy Soft tissues Soft tissue edema about the left hip. Vascular calcifications in the pelvis. IMPRESSION: Acute mildly impacted and comminuted subcapital femoral neck fracture with mild apex anterior and medial angulation. Electronically Signed   By: Jasmine Pang M.D.   On: 02/10/2023 21:23   DG Pelvis 1-2 Views Result Date: 02/10/2023 CLINICAL DATA:  Fall EXAM: PELVIS - 1-2 VIEW COMPARISON:  CT 12/13/2022 FINDINGS: Acute left femoral neck fracture. No femoral head dislocation. Pubic symphysis and rami appear intact. Mild to moderate hip degenerative changes. Vascular calcifications IMPRESSION: Acute left femoral neck fracture. Electronically Signed   By: Jasmine Pang M.D.   On: 02/10/2023 19:56   DG Chest 1 View Result Date: 02/10/2023 CLINICAL DATA:  Fall with hip pain EXAM: CHEST  1 VIEW COMPARISON:  12/27/2022 FINDINGS: Hardware in the cervical spine. Low lung volumes. Chronic interstitial opacities. Linear areas of atelectasis or possible scarring at the lung bases. Stable cardiomediastinal silhouette with aortic atherosclerosis. No pneumothorax IMPRESSION: Low lung volumes with chronic interstitial opacities and linear areas of atelectasis or possible scarring at the lung bases. Electronically Signed   By: Jasmine Pang M.D.   On: 02/10/2023 19:55   DG Knee 1-2 Views Left Result Date: 02/10/2023 CLINICAL DATA:  Fall with pain EXAM: LEFT KNEE - 1-2 VIEW COMPARISON:  None Available. FINDINGS: Limited by positioning. Sclerosis in the distal shaft and metaphysis of the femur suggestive of an  infarct. No acute fracture or malalignment. No sizeable effusion IMPRESSION: 1. No acute osseous abnormality. Electronically Signed   By: Jasmine Pang M.D.   On: 02/10/2023 19:54   DG FEMUR MIN 2 VIEWS LEFT Result Date: 02/10/2023 CLINICAL DATA:  Fall left hip and sacral pain EXAM: LEFT FEMUR 2 VIEWS COMPARISON:  None Available. FINDINGS: Sclerosis in the distal shaft of the femur which may be due to infarct. Acute left femoral neck fracture. No femoral head dislocation. Vascular calcifications. IMPRESSION: Acute left femoral  neck fracture. Electronically Signed   By: Jasmine Pang M.D.   On: 02/10/2023 19:53   CT HEAD WO CONTRAST Result Date: 02/10/2023 CLINICAL DATA:  Head and neck trauma from fall EXAM: CT HEAD WITHOUT CONTRAST CT CERVICAL SPINE WITHOUT CONTRAST TECHNIQUE: Multidetector CT imaging of the head and cervical spine was performed following the standard protocol without intravenous contrast. Multiplanar CT image reconstructions of the cervical spine were also generated. RADIATION DOSE REDUCTION: This exam was performed according to the departmental dose-optimization program which includes automated exposure control, adjustment of the mA and/or kV according to patient size and/or use of iterative reconstruction technique. COMPARISON:  CT and MRI 12/28/2022 and CT 11/15/2017 FINDINGS: CT HEAD FINDINGS Brain: No intracranial hemorrhage, mass effect, or evidence of acute infarct. No hydrocephalus. No extra-axial fluid collection. Age related cerebral atrophy and chronic small vessel ischemic disease. Chronic right occipital infarct. Vascular: No hyperdense vessel. Intracranial arterial calcification. Skull: No fracture or focal lesion. Sinuses/Orbits: No acute finding. Other: None. CT CERVICAL SPINE FINDINGS Alignment: No evidence of traumatic malalignment. Skull base and vertebrae: No acute fracture. No primary bone lesion or focal pathologic process. Soft tissues and spinal canal: No prevertebral  fluid or swelling. No visible canal hematoma. Disc levels: Unchanged multilevel spondylosis greatest at C5-C6. ACDF C6-C7. Mild spinal canal narrowing at C3-C4, C5-C6 and C6-C7 secondary to posterior disc osteophyte complexes. No severe spinal canal narrowing. Multilevel facet arthropathy. Upper chest: No acute abnormality. Other: Carotid calcification. IMPRESSION: 1. No acute intracranial abnormality. 2. No acute fracture in the cervical spine. Electronically Signed   By: Minerva Fester M.D.   On: 02/10/2023 19:47   CT CERVICAL SPINE WO CONTRAST Result Date: 02/10/2023 CLINICAL DATA:  Head and neck trauma from fall EXAM: CT HEAD WITHOUT CONTRAST CT CERVICAL SPINE WITHOUT CONTRAST TECHNIQUE: Multidetector CT imaging of the head and cervical spine was performed following the standard protocol without intravenous contrast. Multiplanar CT image reconstructions of the cervical spine were also generated. RADIATION DOSE REDUCTION: This exam was performed according to the departmental dose-optimization program which includes automated exposure control, adjustment of the mA and/or kV according to patient size and/or use of iterative reconstruction technique. COMPARISON:  CT and MRI 12/28/2022 and CT 11/15/2017 FINDINGS: CT HEAD FINDINGS Brain: No intracranial hemorrhage, mass effect, or evidence of acute infarct. No hydrocephalus. No extra-axial fluid collection. Age related cerebral atrophy and chronic small vessel ischemic disease. Chronic right occipital infarct. Vascular: No hyperdense vessel. Intracranial arterial calcification. Skull: No fracture or focal lesion. Sinuses/Orbits: No acute finding. Other: None. CT CERVICAL SPINE FINDINGS Alignment: No evidence of traumatic malalignment. Skull base and vertebrae: No acute fracture. No primary bone lesion or focal pathologic process. Soft tissues and spinal canal: No prevertebral fluid or swelling. No visible canal hematoma. Disc levels: Unchanged multilevel  spondylosis greatest at C5-C6. ACDF C6-C7. Mild spinal canal narrowing at C3-C4, C5-C6 and C6-C7 secondary to posterior disc osteophyte complexes. No severe spinal canal narrowing. Multilevel facet arthropathy. Upper chest: No acute abnormality. Other: Carotid calcification. IMPRESSION: 1. No acute intracranial abnormality. 2. No acute fracture in the cervical spine. Electronically Signed   By: Minerva Fester M.D.   On: 02/10/2023 19:47    Review of Systems  Constitutional:  Positive for fatigue.  Gastrointestinal:  Positive for diarrhea.   Blood pressure (!) 142/66, pulse 98, temperature 97.7 F (36.5 C), temperature source Oral, resp. rate 18, height 5' (1.524 m), weight 51 kg, SpO2 94%. Physical Exam Constitutional:      Appearance:  She is not diaphoretic.  Cardiovascular:     Pulses: Normal pulses.  Pulmonary:     Effort: Pulmonary effort is normal.  Neurological:     Mental Status: She is alert and oriented to person, place, and time.  Psychiatric:        Mood and Affect: Mood normal.   Patient alert and oriented x 3  Lower extremities: Left leg shortened and externally rotated.  Right hip good range of motion with no significant pain.  No gross deformities lower extremities.  Bruising over the anterior proximal left tibia.  Significant tenderness throughout bilateral lower legs and knees.  Sensation subjectively intact to light touch throughout both feet.  Dorsiflexion plantarflexion bilateral ankles intact.  Assessment/Plan: Radiographs AP pelvis lateral view of the left hip are reviewed.  This shows an impacted left hip subcapital fracture with mild angulation.  Personally discussed radiographic findings with the patient.  Discussed need for surgical intervention to allow her to ambulate.  Recommend left total hip arthroplasty.  Risk benefits discussed.  Questions were encouraged and answered by Dr. Magnus Ivan myself.  Patient was seen and evaluated by Dr. Magnus Ivan and  myself.  Amalia Edgecombe 02/11/2023, 11:21 AM

## 2023-02-11 NOTE — Plan of Care (Signed)
  Problem: Education: Goal: Ability to describe self-care measures that may prevent or decrease complications (Diabetes Survival Skills Education) will improve Outcome: Not Progressing Goal: Individualized Educational Video(s) Outcome: Not Progressing   Problem: Coping: Goal: Ability to adjust to condition or change in health will improve Outcome: Not Progressing   Problem: Fluid Volume: Goal: Ability to maintain a balanced intake and output will improve Outcome: Not Progressing   Problem: Health Behavior/Discharge Planning: Goal: Ability to identify and utilize available resources and services will improve Outcome: Not Progressing Goal: Ability to manage health-related needs will improve Outcome: Not Progressing   Problem: Metabolic: Goal: Ability to maintain appropriate glucose levels will improve Outcome: Not Progressing   Problem: Nutritional: Goal: Maintenance of adequate nutrition will improve Outcome: Not Progressing Goal: Progress toward achieving an optimal weight will improve Outcome: Not Progressing   Problem: Skin Integrity: Goal: Risk for impaired skin integrity will decrease Outcome: Not Progressing   Problem: Tissue Perfusion: Goal: Adequacy of tissue perfusion will improve Outcome: Not Progressing   Problem: Education: Goal: Knowledge of General Education information will improve Description: Including pain rating scale, medication(s)/side effects and non-pharmacologic comfort measures Outcome: Not Progressing   Problem: Health Behavior/Discharge Planning: Goal: Ability to manage health-related needs will improve Outcome: Not Progressing   Problem: Clinical Measurements: Goal: Ability to maintain clinical measurements within normal limits will improve Outcome: Not Progressing Goal: Will remain free from infection Outcome: Not Progressing Goal: Diagnostic test results will improve Outcome: Not Progressing Goal: Respiratory complications will  improve Outcome: Not Progressing Goal: Cardiovascular complication will be avoided Outcome: Not Progressing   Problem: Activity: Goal: Risk for activity intolerance will decrease Outcome: Not Progressing   Problem: Nutrition: Goal: Adequate nutrition will be maintained Outcome: Not Progressing   Problem: Coping: Goal: Level of anxiety will decrease Outcome: Not Progressing   Problem: Elimination: Goal: Will not experience complications related to bowel motility Outcome: Not Progressing Goal: Will not experience complications related to urinary retention Outcome: Not Progressing   Problem: Pain Managment: Goal: General experience of comfort will improve and/or be controlled Outcome: Not Progressing   Problem: Safety: Goal: Ability to remain free from injury will improve Outcome: Not Progressing   Problem: Skin Integrity: Goal: Risk for impaired skin integrity will decrease Outcome: Not Progressing   Problem: Education: Goal: Knowledge of the prescribed therapeutic regimen will improve Outcome: Not Progressing Goal: Understanding of discharge needs will improve Outcome: Not Progressing Goal: Individualized Educational Video(s) Outcome: Not Progressing   Problem: Activity: Goal: Ability to avoid complications of mobility impairment will improve Outcome: Not Progressing Goal: Ability to tolerate increased activity will improve Outcome: Not Progressing   Problem: Clinical Measurements: Goal: Postoperative complications will be avoided or minimized Outcome: Not Progressing   Problem: Pain Management: Goal: Pain level will decrease with appropriate interventions Outcome: Not Progressing   Problem: Skin Integrity: Goal: Will show signs of wound healing Outcome: Not Progressing

## 2023-02-11 NOTE — Discharge Instructions (Addendum)
Per [facility] clinic policy, our goal is ensure optimal postoperative pain control with a multimodal pain management strategy. For all [facility] patients, our goal is to wean post-operative narcotic medications by 6 weeks post-operatively. If this is not possible due to utilization of pain medication prior to surgery, your [facility] doctor will support your acute post-operative pain control for the first 6 weeks postoperatively, with a plan to transition you back to your primary pain team following that. Sandra [name] will work to ensure a Therapist, occupational.  INSTRUCTIONS AFTER JOINT REPLACEMENT   Remove items at home which could result in a fall. This includes throw rugs or furniture in walking pathways ICE to the affected joint every three hours while awake for 30 minutes at a time, for at least the first 3-5 days, and then as needed for pain and swelling.  Continue to use ice for pain and swelling. You may notice swelling that will progress down to the foot and ankle.  This is normal after surgery.  Elevate your leg when you are not up walking on it.   Continue to use the breathing machine you got in the hospital (incentive spirometer) which will help keep your temperature down.  It is common for your temperature to cycle up and down following surgery, especially at night when you are not up moving around and exerting yourself.  The breathing machine keeps your lungs expanded and your temperature down.   DIET:  As you were doing prior to hospitalization, we recommend a well-balanced diet.  DRESSING / WOUND CARE / SHOWERING  Keep the surgical dressing until follow up.  The dressing is water proof, so you can shower without any extra covering.  IF THE DRESSING FALLS OFF or the wound gets wet inside, change the dressing with sterile gauze.  Please use good hand washing techniques before changing the dressing.  Do not use any lotions or creams on the incision until instructed by your surgeon.     ACTIVITY  Increase activity slowly as tolerated, but follow the weight bearing instructions below.   No driving for 6 weeks or until further direction given by your physician.  You cannot drive while taking narcotics.  No lifting or carrying greater than 10 lbs. until further directed by your surgeon. Avoid periods of inactivity such as sitting longer than an hour when not asleep. This helps prevent blood clots.  You may return to work once you are authorized by your doctor.     WEIGHT BEARING   Weight bearing as tolerated with assist device (walker, cane, etc) as directed, use it as long as suggested by your surgeon or therapist, typically at least 4-6 weeks.   EXERCISES  Results after joint replacement surgery are often greatly improved when you follow the exercise, range of motion and muscle strengthening exercises prescribed by your doctor. Safety measures are also important to protect the joint from further injury. Any time any of these exercises cause you to have increased pain or swelling, decrease what you are doing until you are comfortable again and then slowly increase them. If you have problems or questions, call your caregiver or physical therapist for advice.   Rehabilitation is important following a joint replacement. After just a few days of immobilization, the muscles of the leg can become weakened and shrink (atrophy).  These exercises are designed to build up the tone and strength of the thigh and leg muscles and to improve motion. Often times heat used for twenty to thirty minutes before  working out will loosen up your tissues and help with improving the range of motion but do not use heat for the first two weeks following surgery (sometimes heat can increase post-operative swelling).   These exercises can be done on a training (exercise) mat, on the floor, on a table or on a bed. Use whatever works the best and is most comfortable for you.    Use music or television  while you are exercising so that the exercises are a pleasant break in your day. This will make your life better with the exercises acting as a break in your routine that you can look forward to.   Perform all exercises about fifteen times, three times per day or as directed.  You should exercise both the operative leg and the other leg as well.  Exercises include:   Quad Sets - Tighten up the muscle on the front of the thigh (Quad) and hold for 5-10 seconds.   Straight Leg Raises - With your knee straight (if you were given a brace, keep it on), lift the leg to 60 degrees, hold for 3 seconds, and slowly lower the leg.  Perform this exercise against resistance later as your leg gets stronger.  Leg Slides: Lying on your back, slowly slide your foot toward your buttocks, bending your knee up off the floor (only go as far as is comfortable). Then slowly slide your foot back down until your leg is flat on the floor again.  Angel Wings: Lying on your back spread your legs to the side as far apart as you can without causing discomfort.  Hamstring Strength:  Lying on your back, push your heel against the floor with your leg straight by tightening up the muscles of your buttocks.  Repeat, but this time bend your knee to a comfortable angle, and push your heel against the floor.  You may put a pillow under the heel to make it more comfortable if necessary.   A rehabilitation program following joint replacement surgery can speed recovery and prevent re-injury in the future due to weakened muscles. Contact your doctor or a physical therapist for more information on knee rehabilitation.    CONSTIPATION  Constipation is defined medically as fewer than three stools per week and severe constipation as less than one stool per week.  Even if you have a regular bowel pattern at home, your normal regimen is likely to be disrupted due to multiple reasons following surgery.  Combination of anesthesia, postoperative  narcotics, change in appetite and fluid intake all can affect your bowels.   YOU MUST use at least one of the following options; they are listed in order of increasing strength to get the job done.  They are all available over the counter, and you may need to use some, POSSIBLY even all of these options:    Drink plenty of fluids (prune juice may be helpful) and high fiber foods Colace 100 mg by mouth twice a day  Senokot for constipation as directed and as needed Dulcolax (bisacodyl), take with full glass of water  Miralax (polyethylene glycol) once or twice a day as needed.  If you have tried all these things and are unable to have a bowel movement in the first 3-4 days after surgery call either your surgeon or your primary doctor.    If you experience loose stools or diarrhea, hold the medications until you stool forms back up.  If your symptoms do not get better within 1 week  or if they get worse, check with your doctor.  If you experience "the worst abdominal pain ever" or develop nausea or vomiting, please contact the office immediately for further recommendations for treatment.   ITCHING:  If you experience itching with your medications, try taking only a single pain pill, or even half a pain pill at a time.  You can also use Benadryl over the counter for itching or also to help with sleep.   TED HOSE STOCKINGS:  Use stockings on both legs until for at least 2 weeks or as directed by physician office. They may be removed at night for sleeping.  MEDICATIONS:  See your medication summary on the "After Visit Summary" that nursing will review with you.  You may have some home medications which will be placed on hold until you complete the course of blood thinner medication.  It is important for you to complete the blood thinner medication as prescribed.  PRECAUTIONS:  If you experience chest pain or shortness of breath - call 911 immediately for transfer to the hospital emergency department.    If you develop a fever greater that 101 F, purulent drainage from wound, increased redness or drainage from wound, foul odor from the wound/dressing, or calf pain - CONTACT YOUR SURGEON.                                                   FOLLOW-UP APPOINTMENTS:  If you do not already have a post-op appointment, please call the office for an appointment to be seen by your surgeon.  Guidelines for how soon to be seen are listed in your "After Visit Summary", but are typically between 1-4 weeks after surgery.  OTHER INSTRUCTIONS:   Knee Replacement:  Do not place pillow under knee, focus on keeping the knee straight while resting. CPM instructions: 0-90 degrees, 2 hours in the morning, 2 hours in the afternoon, and 2 hours in the evening. Place foam block, curve side up under heel at all times except when in CPM or when walking.  DO NOT modify, tear, cut, or change the foam block in any way.  POST-OPERATIVE OPIOID TAPER INSTRUCTIONS: It is important to wean off of your opioid medication as soon as possible. If you do not need pain medication after your surgery it is ok to stop day one. Opioids include: Codeine, Hydrocodone(Norco, Vicodin), Oxycodone(Percocet, oxycontin) and hydromorphone amongst others.  Long term and even short term use of opiods can cause: Increased pain response Dependence Constipation Depression Respiratory depression And more.  Withdrawal symptoms can include Flu like symptoms Nausea, vomiting And more Techniques to manage these symptoms Hydrate well Eat regular healthy meals Stay active Use relaxation techniques(deep breathing, meditating, yoga) Do Not substitute Alcohol to help with tapering If you have been on opioids for less than two weeks and do not have pain than it is ok to stop all together.  Plan to wean off of opioids This plan should start within one week post op of your joint replacement. Maintain the same interval or time between taking each dose  and first decrease the dose.  Cut the total daily intake of opioids by one tablet each day Next start to increase the time between doses. The last dose that should be eliminated is the evening dose.   MAKE SURE YOU:  Understand these instructions.  Get help right away if you are not doing well or get worse.    Thank you for letting us be a part of your medical care team.  It is a privilege we respect greatly.  We hope these instructions will help you stay on track for a fast and full recovery!     The patient should only be up with using a walker and with people assisting her when she is up.  She can put full weight as tolerated on her left hip.  A dry dressing should remain over her left hip incision on a daily basis.

## 2023-02-11 NOTE — Progress Notes (Signed)
Previous Flag on chart stating pt has a legal guardian. This is inaccurate according to family, pt is able to sign papers and consent for herself. Flag removed from chart at this time.

## 2023-02-11 NOTE — Progress Notes (Signed)
Patient arrived to room 07, surgical incision checked, patient placed on 2L of oxygen from PACU, bed placed in low position, call bell within reach, skin assessment complete, family at bedside- her daughter and grandson. Telemetry placed on and tele called.

## 2023-02-11 NOTE — Progress Notes (Signed)
PROGRESS NOTE    Sandra [name]  UJW:[id number] DOB: 1933-06-06 DOA: 02/10/2023 PCP: [name], NP    Brief Narrative:  89 year old female with past medical history of HTN, T2DM, HLD, anxiety, history of CVA.  Patient has been in and out of the ER/hospital since 11/24.  On 11/24 she was in the ER, with diarrhea diagnosed with colitis.  Discharged with Cipro and Flagyl.  Patient had ongoing nausea vomiting despite antibiotics.  Admitted 11/30 - 12/10 with acute on chronic kidney disease..  Creatinine 5, baseline 2.7..  Patient treated with IV fluids and antibiotics, discharged home.  Patient continues to be weak.  Her diarrhea has resolved.  Tonight patient got up to go to the restroom.  Her daughter heard her screaming after she fell. Found to have hip fracture and plan for OR on 1/23.   Assessment and Plan: Subcapital fracture of hip, left, closed, initial encounter (HCC) -N.p.o., IV fluid hydration- plan for OR on 1/23 -IV Dilaudid as needed -Orthopedics on-call consulted : Dr Steward Drone   Anxiety state -Celexa resumed -Klonopin as needed resumed   History of CVA -Hold plavix   Benign essential HTN -Hydralazine resumed. -Metoprolol held with patient's borderline BP   Diabetes mellitus type 2 with retinopathy (HCC) -Sliding scale insulin initiated   dyslipidemia -Atorvastatin resumed   Delirium precautions -PRN haldol   DVT prophylaxis: heparin injection 5,000 Units Start: 02/11/23 1400    Code Status: Limited: Do not attempt resuscitation (DNR) -DNR-LIMITED -Do Not Intubate/DNI  Family Communication:   Disposition Plan:  Level of care: Telemetry Medical Status is: Inpatient     Consultants:  ortho   Subjective: confused  Objective: Vitals:   02/11/23 0300 02/11/23 0400 02/11/23 0600 02/11/23 0748  BP: (!) 166/73 (!) 164/60 (!) 130/53 (!) 166/64  Pulse: (!) 106 (!) 102 74 (!) 105  Resp: 15 17 12 20   Temp:  98.3 F (36.8 C)  98 F (36.7 C)   TempSrc:  Oral  Oral  SpO2: 100% 98% 100% 94%   No intake or output data in the 24 hours ending 02/11/23 1002 There were no vitals filed for this visit.  Examination:   General: Appearance:    elderly female -- agitated at times     Lungs:     respirations unlabored  Heart:    Tachycardic. Normal rhythm. No murmurs, rubs, or gallops.    MS:   All extremities are intact.    Neurologic:   Confused to place/time/situation       Data Reviewed: I have personally reviewed following labs and imaging studies  CBC: Recent Labs  Lab 02/10/23 1728 02/11/23 0419  WBC 6.1 7.4  NEUTROABS 3.9 6.0  HGB 8.9* 8.7*  HCT 27.4* 27.0*  MCV 96.5 97.5  PLT 233 223   Basic Metabolic Panel: Recent Labs  Lab 02/10/23 1728 02/10/23 2327 02/11/23 0419  NA 136  --  137  K 4.3  --  4.6  CL 104  --  106  CO2 24  --  20*  GLUCOSE 327*  --  287*  BUN 29*  --  26*  CREATININE 2.30* 2.22* 2.15*  CALCIUM 8.5*  --  8.2*   GFR: Estimated Creatinine Clearance: 13.2 mL/min (A) (by C-G formula based on SCr of 2.15 mg/dL (H)). Liver Function Tests: No results for input(s): "AST", "ALT", "ALKPHOS", "BILITOT", "PROT", "ALBUMIN" in the last 168 hours. No results for input(s): "LIPASE", "AMYLASE" in the last 168 hours. No results for input(s): "AMMONIA"  in the last 168 hours. Coagulation Profile: Recent Labs  Lab 02/10/23 1728  INR 1.0   Cardiac Enzymes: No results for input(s): "CKTOTAL", "CKMB", "CKMBINDEX", "TROPONINI" in the last 168 hours. BNP (last 3 results) No results for input(s): "PROBNP" in the last 8760 hours. HbA1C: No results for input(s): "HGBA1C" in the last 72 hours. CBG: Recent Labs  Lab 02/10/23 2326 02/11/23 0417 02/11/23 0804  GLUCAP 281* 266* 234*   Lipid Profile: No results for input(s): "CHOL", "HDL", "LDLCALC", "TRIG", "CHOLHDL", "LDLDIRECT" in the last 72 hours. Thyroid Function Tests: No results for input(s): "TSH", "T4TOTAL", "FREET4", "T3FREE",  "THYROIDAB" in the last 72 hours. Anemia Panel: No results for input(s): "VITAMINB12", "FOLATE", "FERRITIN", "TIBC", "IRON", "RETICCTPCT" in the last 72 hours. Sepsis Labs: No results for input(s): "PROCALCITON", "LATICACIDVEN" in the last 168 hours.  No results found for this or any previous visit (from the past 240 hours).       Radiology Studies: CT HIP LEFT WO CONTRAST Result Date: 02/10/2023 CLINICAL DATA:  Fall with left hip pain EXAM: CT OF THE LEFT HIP WITHOUT CONTRAST TECHNIQUE: Multidetector CT imaging of the left hip was performed according to the standard protocol. Multiplanar CT image reconstructions were also generated. RADIATION DOSE REDUCTION: This exam was performed according to the departmental dose-optimization program which includes automated exposure control, adjustment of the mA and/or kV according to patient size and/or use of iterative reconstruction technique. COMPARISON:  02/10/2023 FINDINGS: Bones/Joint/Cartilage Acute mildly impacted and comminuted sub capital femoral neck fracture with mild apex anterior angulation. No femoral head dislocation. Pubic symphysis and rami appear intact. No SI joint widening. Ligaments Suboptimally assessed by CT. Muscles and Tendons No intramuscular fluid collections.  No significant atrophy Soft tissues Soft tissue edema about the left hip. Vascular calcifications in the pelvis. IMPRESSION: Acute mildly impacted and comminuted subcapital femoral neck fracture with mild apex anterior and medial angulation. Electronically Signed   By: Jasmine Pang M.D.   On: 02/10/2023 21:23   DG Pelvis 1-2 Views Result Date: 02/10/2023 CLINICAL DATA:  Fall EXAM: PELVIS - 1-2 VIEW COMPARISON:  CT 12/13/2022 FINDINGS: Acute left femoral neck fracture. No femoral head dislocation. Pubic symphysis and rami appear intact. Mild to moderate hip degenerative changes. Vascular calcifications IMPRESSION: Acute left femoral neck fracture. Electronically Signed   By:  Jasmine Pang M.D.   On: 02/10/2023 19:56   DG Chest 1 View Result Date: 02/10/2023 CLINICAL DATA:  Fall with hip pain EXAM: CHEST  1 VIEW COMPARISON:  12/27/2022 FINDINGS: Hardware in the cervical spine. Low lung volumes. Chronic interstitial opacities. Linear areas of atelectasis or possible scarring at the lung bases. Stable cardiomediastinal silhouette with aortic atherosclerosis. No pneumothorax IMPRESSION: Low lung volumes with chronic interstitial opacities and linear areas of atelectasis or possible scarring at the lung bases. Electronically Signed   By: Jasmine Pang M.D.   On: 02/10/2023 19:55   DG Knee 1-2 Views Left Result Date: 02/10/2023 CLINICAL DATA:  Fall with pain EXAM: LEFT KNEE - 1-2 VIEW COMPARISON:  None Available. FINDINGS: Limited by positioning. Sclerosis in the distal shaft and metaphysis of the femur suggestive of an infarct. No acute fracture or malalignment. No sizeable effusion IMPRESSION: 1. No acute osseous abnormality. Electronically Signed   By: Jasmine Pang M.D.   On: 02/10/2023 19:54   DG FEMUR MIN 2 VIEWS LEFT Result Date: 02/10/2023 CLINICAL DATA:  Fall left hip and sacral pain EXAM: LEFT FEMUR 2 VIEWS COMPARISON:  None Available. FINDINGS: Sclerosis in the  distal shaft of the femur which may be due to infarct. Acute left femoral neck fracture. No femoral head dislocation. Vascular calcifications. IMPRESSION: Acute left femoral neck fracture. Electronically Signed   By: Jasmine Pang M.D.   On: 02/10/2023 19:53   CT HEAD WO CONTRAST Result Date: 02/10/2023 CLINICAL DATA:  Head and neck trauma from fall EXAM: CT HEAD WITHOUT CONTRAST CT CERVICAL SPINE WITHOUT CONTRAST TECHNIQUE: Multidetector CT imaging of the head and cervical spine was performed following the standard protocol without intravenous contrast. Multiplanar CT image reconstructions of the cervical spine were also generated. RADIATION DOSE REDUCTION: This exam was performed according to the departmental  dose-optimization program which includes automated exposure control, adjustment of the mA and/or kV according to patient size and/or use of iterative reconstruction technique. COMPARISON:  CT and MRI 12/28/2022 and CT 11/15/2017 FINDINGS: CT HEAD FINDINGS Brain: No intracranial hemorrhage, mass effect, or evidence of acute infarct. No hydrocephalus. No extra-axial fluid collection. Age related cerebral atrophy and chronic small vessel ischemic disease. Chronic right occipital infarct. Vascular: No hyperdense vessel. Intracranial arterial calcification. Skull: No fracture or focal lesion. Sinuses/Orbits: No acute finding. Other: None. CT CERVICAL SPINE FINDINGS Alignment: No evidence of traumatic malalignment. Skull base and vertebrae: No acute fracture. No primary bone lesion or focal pathologic process. Soft tissues and spinal canal: No prevertebral fluid or swelling. No visible canal hematoma. Disc levels: Unchanged multilevel spondylosis greatest at C5-C6. ACDF C6-C7. Mild spinal canal narrowing at C3-C4, C5-C6 and C6-C7 secondary to posterior disc osteophyte complexes. No severe spinal canal narrowing. Multilevel facet arthropathy. Upper chest: No acute abnormality. Other: Carotid calcification. IMPRESSION: 1. No acute intracranial abnormality. 2. No acute fracture in the cervical spine. Electronically Signed   By: Minerva Fester M.D.   On: 02/10/2023 19:47   CT CERVICAL SPINE WO CONTRAST Result Date: 02/10/2023 CLINICAL DATA:  Head and neck trauma from fall EXAM: CT HEAD WITHOUT CONTRAST CT CERVICAL SPINE WITHOUT CONTRAST TECHNIQUE: Multidetector CT imaging of the head and cervical spine was performed following the standard protocol without intravenous contrast. Multiplanar CT image reconstructions of the cervical spine were also generated. RADIATION DOSE REDUCTION: This exam was performed according to the departmental dose-optimization program which includes automated exposure control, adjustment of the mA  and/or kV according to patient size and/or use of iterative reconstruction technique. COMPARISON:  CT and MRI 12/28/2022 and CT 11/15/2017 FINDINGS: CT HEAD FINDINGS Brain: No intracranial hemorrhage, mass effect, or evidence of acute infarct. No hydrocephalus. No extra-axial fluid collection. Age related cerebral atrophy and chronic small vessel ischemic disease. Chronic right occipital infarct. Vascular: No hyperdense vessel. Intracranial arterial calcification. Skull: No fracture or focal lesion. Sinuses/Orbits: No acute finding. Other: None. CT CERVICAL SPINE FINDINGS Alignment: No evidence of traumatic malalignment. Skull base and vertebrae: No acute fracture. No primary bone lesion or focal pathologic process. Soft tissues and spinal canal: No prevertebral fluid or swelling. No visible canal hematoma. Disc levels: Unchanged multilevel spondylosis greatest at C5-C6. ACDF C6-C7. Mild spinal canal narrowing at C3-C4, C5-C6 and C6-C7 secondary to posterior disc osteophyte complexes. No severe spinal canal narrowing. Multilevel facet arthropathy. Upper chest: No acute abnormality. Other: Carotid calcification. IMPRESSION: 1. No acute intracranial abnormality. 2. No acute fracture in the cervical spine. Electronically Signed   By: Minerva Fester M.D.   On: 02/10/2023 19:47        Scheduled Meds:  atorvastatin  80 mg Oral Daily   heparin  5,000 Units Subcutaneous Q8H   hydrALAZINE  25  mg Oral TID   insulin aspart  0-15 Units Subcutaneous TID WC   insulin aspart  0-5 Units Subcutaneous QHS   metoprolol succinate  50 mg Oral Daily   Continuous Infusions:   LOS: 1 day    Time spent: 45 minutes spent on chart review, discussion with nursing staff, consultants, updating family and interview/physical exam; more than 50% of that time was spent in counseling and/or coordination of care.    Joseph Art, DO [facility] Available via Epic secure chat 7am-7pm After these hours, please refer  to coverage provider listed on [email or web address] 02/11/2023, 10:02 AM

## 2023-02-11 NOTE — TOC CAGE-AID Note (Signed)
Transition of Care [facility]) - CAGE-AID Screening  Patient Details  Name: Sandra [name] MRN: [id number] Date of Birth: 1933-06-15  Clinical Narrative:  Patient to ED after a fall at home causing a hip fracture. Patient endorses drinking occasional alcohol, approximately 1 per week. Patient denies any drug use or smoking. Patient denied need for substance abuse resources.  CAGE-AID Screening:   Have You Ever Felt You Ought to Cut Down on Your Drinking or Drug Use?: No Have People Annoyed You By Critizing Your Drinking Or Drug Use?: No Have You Felt Bad Or Guilty About Your Drinking Or Drug Use?: No Have You Ever Had a Drink or Used Drugs First Thing In The Morning to Steady Your Nerves or to Get Rid of a Hangover?: No CAGE-AID Score: 0  Substance Abuse Education Offered: No

## 2023-02-11 NOTE — Progress Notes (Signed)
Pt refused taking medication. Education reinforced. Pt began to bat and threaten staff, demanding that they and all the people in the hall way leave their house.  Pt then ripped out iv and began to scream.

## 2023-02-11 NOTE — Op Note (Signed)
Operative Note  Date of operation: 02/11/2023 Preoperative diagnosis: Left hip impacted and displaced subcapital femoral neck fracture Postoperative diagnosis: Same  Procedure: Left direct anterior hip hemiarthroplasty  Implants: DePuy 44/28+1.5 bipolar metal hip ball Size 3 Actis femoral component with high offset  Surgeon: Vanita Panda. 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  She lives alone but has good family support.  She had a mechanical fall yesterday evening landing on her left hip.  She had significant pain and the inability to ambulate on her left lower extremity.  She was brought to the [facility] emergency room and found to have a displaced and impacted left hip subcapital femoral neck fracture.  She is on Plavix.  She was graciously admitted to the hospitalist service for medical management and clearance for surgery today.  I have talked to the patient and her family in length in detail about recommendations for surgical intervention.  We have collectively agreed that a hemiarthroplasty would be in her best interest in order to improve her mobility, decrease her pain and hopefully improve her quality of life.  We did discuss the risks of acute blood loss anemia, nerve or vessel injury, fracture, infection, DVT, dislocation, implant failure, leg length differences and wound healing issues.  Procedure description: After informed consent was obtained and the appropriate left hip was marked, the patient was brought to the operating room where general anesthesia was obtained while she is on the stretcher.  Traction boots were then placed on both her feet and she was next placed supine on the Hana fracture table with a perineal post and placed in both legs and inline skeletal traction devices no traction applied.  Her left  operative hip and pelvis were assessed radiographically.  The left hip was prepped and draped with DuraPrep and sterile drapes.  A timeout was called and she was identified as the correct patient and the correct left hip.  An incision was then made just inferior and posterior to the ASIS and carried slightly obliquely down the leg.  Dissection was carried down to the tensor fascia lata muscle and the tensor fascia was divided longitudinally to proceed with a direct anterior process the hip.  Circumflex vessels were identified and cauterized.  The hip capsule was then divided and opened up in L-type format finding a moderate hemarthrosis consistent with a femoral neck fracture and then you can see she did have a displaced femoral neck fracture.  We placed Cobra retractors around the medial and lateral femoral neck just below the fracture and made a freshening femoral neck cut distal to the fracture but proximal lesser trochanter.  This was made with an oscillating saw and completed with an osteotome.  Remnants of the femoral neck were then removed and a corkscrew guide was placed in femoral head the femoral head was moves its entirety.  There was no significant arthritis that could be seen.  We then measured that hip ball for a size 44 large metal head.  Attention was then turned to the femur.  With the left leg externally rotated to 120 degrees, extended and adducted, we were able to place a medial retractor medially and a Hohmann retractor behind the greater trochanter.  The lateral joint capsule was released and a box cutting osteotome was used in her femoral canal.  Broaching was then initiated using the  Actis broaching system from a size 0 going to a size 3.  We then trialed a standard offset femoral neck and a 44/28+1.5 bipolar trial hip ball.  This was reduced in the pelvis and based on clinical and radiographic assessment we felt like we needed just more offset.  We dislocated the hip and removed the trial  components.  We placed the real Actis femoral component size 3 with high offset and went with the real 44/28+1.5 bipolar metal hip ball.  Again this was reduced in the pelvis and replace the leg length, offset, range of motion and stability assessed mechanically and radiographically.  The soft tissue was then irrigated with normal saline solution.  The joint capsule was closed with interrupted #1 Ethibond suture followed by #1 Vicryl close the tensor fascia.  0 Vicryl was used to close the deep tissue and 2-0 Vicryl was used to close subcutaneous tissue.  The skin was closed with staples.  An Aquacel dressing was applied.  The patient was taken off of the Hana table, awakened, extubated and taken the recovery room in stable condition.  Rexene Edison, PA-C did assist during the entire case from beginning to end and his assistance was medically and clinically necessary for soft tissue management and retraction, helping guide implant placement and a layered closure of the wound.

## 2023-02-11 NOTE — Anesthesia Procedure Notes (Signed)
Procedure Name: Intubation Date/Time: 02/11/2023 12:27 PM  Performed by: Ammie Dalton, CRNAPre-anesthesia Checklist: Patient identified, Emergency Drugs available, Suction available and Patient being monitored Patient Re-evaluated:Patient Re-evaluated prior to induction Oxygen Delivery Method: Circle System Utilized Preoxygenation: Pre-oxygenation with 100% oxygen Induction Type: IV induction Ventilation: Mask ventilation without difficulty Laryngoscope Size: Mac and 3 Grade View: Grade I Tube type: Oral Number of attempts: 1 Airway Equipment and Method: Stylet and Oral airway Placement Confirmation: ETT inserted through vocal cords under direct vision, positive ETCO2 and breath sounds checked- equal and bilateral Secured at: 22 cm Tube secured with: Tape Dental Injury: Teeth and Oropharynx as per pre-operative assessment

## 2023-02-11 NOTE — ED Notes (Signed)
Pt combative and tried to slap this RN and Research scientist (life sciences).  Pt tried to spit at NT Erskine Squibb while this RN and Research scientist (life sciences) in room.

## 2023-02-11 NOTE — Transfer of Care (Signed)
Immediate Anesthesia Transfer of Care Note  Patient: Sandra [name]  Procedure(s) Performed: ANTERIOR APPROACH HEMI HIP ARTHROPLASTY (Left: Hip)  Patient Location: PACU  Anesthesia Type:General  Level of Consciousness: drowsy and patient cooperative  Airway & Oxygen Therapy: Patient Spontanous Breathing and Patient connected to nasal cannula oxygen  Post-op Assessment: Report given to RN and Post -op Vital signs reviewed and stable  Post vital signs: Reviewed and stable  Last Vitals:  Vitals Value Taken Time  BP 165/46 02/11/23 1336  Temp 36.5 C 02/11/23 1336  Pulse 88 02/11/23 1340  Resp 14 02/11/23 1340  SpO2 97 % 02/11/23 1340  Vitals shown include unfiled device data.  Last Pain:  Vitals:   02/11/23 1110  TempSrc:   PainSc: 7       Patients Stated Pain Goal: 0 (02/10/23 1703)  Complications: No notable events documented.

## 2023-02-11 NOTE — Anesthesia Postprocedure Evaluation (Signed)
Anesthesia Post Note  Patient: Sandra [name]  Procedure(s) Performed: ANTERIOR APPROACH HEMI HIP ARTHROPLASTY (Left: Hip)     Patient location during evaluation: PACU Anesthesia Type: General Level of consciousness: awake and alert Pain management: pain level controlled Vital Signs Assessment: post-procedure vital signs reviewed and stable Respiratory status: spontaneous breathing, nonlabored ventilation, respiratory function stable and patient connected to nasal cannula oxygen Cardiovascular status: blood pressure returned to baseline and stable Postop Assessment: no apparent nausea or vomiting Anesthetic complications: no   No notable events documented.  Last Vitals:  Vitals:   02/11/23 1415 02/11/23 1430  BP: (!) 157/49 (!) 147/49  Pulse: 79 78  Resp: 14 14  Temp:    SpO2: 100% 100%    Last Pain:  Vitals:   02/11/23 1430  TempSrc:   PainSc: Asleep                 Candid Bovey A.

## 2023-02-11 NOTE — Progress Notes (Signed)
Patient ID: Sandra [name], female   DOB: 04-07-1933, 89 y.o.   MRN: [id number] I have seen and examined the patient.  I have also spoken to family on the phone and at the bedside.  She does have an impacted and displaced left hip femoral neck fracture.  She is 89 years old.  We have recommended a left hip hemiarthroplasty to treat this fracture.  I explained in detail the rationale behind this type of recommendation as well as the risks and benefits involved with surgery.  The risks and benefits of surgery been discussed in detail.  Informed consent has been obtained and the left operative hip has been marked.  Of note I did review Rexene Edison, PA-C's note with his assessment and plan and agree with the consultation note as well.

## 2023-02-11 NOTE — Anesthesia Preprocedure Evaluation (Signed)
Anesthesia Evaluation  Patient identified by MRN, date of birth, ID band Patient awake    Reviewed: Allergy & Precautions, NPO status , Patient's Chart, lab work & pertinent test results  History of Anesthesia Complications Negative for: history of anesthetic complications  Airway Mallampati: III  TM Distance: >3 FB Neck ROM: Full    Dental  (+) Dental Advisory Given, Partial Upper   Pulmonary neg sleep apnea, neg COPD, neg recent URI   breath sounds clear to auscultation       Cardiovascular hypertension, + CAD   Rhythm:Regular  1. Left ventricular ejection fraction, by estimation, is 60 to 65%. The  left ventricle has normal function. The left ventricle has no regional  wall motion abnormalities. Left ventricular diastolic parameters are  consistent with Grade II diastolic  dysfunction (pseudonormalization). Elevated left atrial pressure.   2. Right ventricular systolic function is normal. The right ventricular  size is normal. There is mildly elevated pulmonary artery systolic  pressure. The estimated right ventricular systolic pressure is 39.0 mmHg.   3. The mitral valve is normal in structure. Trivial mitral valve  regurgitation. No evidence of mitral stenosis.   4. The aortic valve is tricuspid. There is mild calcification of the  aortic valve. Aortic valve regurgitation is trivial. Aortic valve  sclerosis/calcification is present, without any evidence of aortic  stenosis.   5. The inferior vena cava is normal in size with greater than 50%  respiratory variability, suggesting right atrial pressure of 3 mmHg.     Neuro/Psych Seizures -,  PSYCHIATRIC DISORDERS Anxiety Depression    TIA Neuromuscular disease CVA    GI/Hepatic Neg liver ROS,,,  Endo/Other  diabetes  Lab Results      Component                Value               Date                      HGBA1C                   8.2 (H)             11/30/2022              Renal/GU CRFRenal diseaseLab Results      Component                Value               Date                      NA                       137                 02/11/2023                K                        4.6                 02/11/2023                CO2                      20 (L)  02/11/2023                GLUCOSE                  287 (H)             02/11/2023                BUN                      26 (H)              02/11/2023                CREATININE               2.15 (H)            02/11/2023                CALCIUM                  8.2 (L)             02/11/2023                GFR                      32.40 (L)           03/22/2019                EGFR                     17 (L)              01/19/2023                GFRNONAA                 22 (L)              02/11/2023                Musculoskeletal  (+) Arthritis ,   LEFT FEMORAL NECK FRACTURE   Abdominal   Peds  Hematology  (+) Blood dyscrasia Lab Results      Component                Value               Date                      WBC                      7.4                 02/11/2023                HGB                      8.7 (L)             02/11/2023                HCT                      27.0 (L)            02/11/2023  MCV                      97.5                02/11/2023                PLT                      223                 02/11/2023            plavix   Anesthesia Other Findings   Reproductive/Obstetrics                              Anesthesia Physical Anesthesia Plan  ASA: 3  Anesthesia Plan: General   Post-op Pain Management: Ofirmev IV (intra-op)*   Induction: Intravenous  PONV Risk Score and Plan: 4 or greater and Ondansetron and Dexamethasone  Airway Management Planned: Oral ETT  Additional Equipment: None  Intra-op Plan:   Post-operative Plan: Extubation in OR  Informed Consent: I have reviewed the patients History and  Physical, chart, labs and discussed the procedure including the risks, benefits and alternatives for the proposed anesthesia with the patient or authorized representative who has indicated his/her understanding and acceptance.   Patient has DNR.     Plan Discussed with: CRNA  Anesthesia Plan Comments:          Anesthesia Quick Evaluation

## 2023-02-11 NOTE — Plan of Care (Signed)
89 year old female with a left subcapital femoral neck fracture after a fall.  Of note she has been recently hospitalized due to chronic kidney disease.  She is currently on Plavix which she took 1/22 AM.  She has a baseline essentially home ambulator with assistive devices.  On physical exam left hip is externally rotated.  Imaging reveals a left displaced femoral neck fracture particularly on the CT.  Patient is n.p.o. pending left hemiarthroplasty today versus 1/24 after 24 hours off of Plavix.  Please remain n.p.o. at this time

## 2023-02-12 ENCOUNTER — Other Ambulatory Visit: Payer: Medicare HMO | Admitting: Gastroenterology

## 2023-02-12 ENCOUNTER — Encounter (HOSPITAL_COMMUNITY): Payer: Self-pay | Admitting: Orthopaedic Surgery

## 2023-02-12 DIAGNOSIS — S72012A Unspecified intracapsular fracture of left femur, initial encounter for closed fracture: Secondary | ICD-10-CM | POA: Diagnosis not present

## 2023-02-12 LAB — BASIC METABOLIC PANEL
Anion gap: 9 (ref 5–15)
BUN: 31 mg/dL — ABNORMAL HIGH (ref 8–23)
CO2: 20 mmol/L — ABNORMAL LOW (ref 22–32)
Calcium: 7.9 mg/dL — ABNORMAL LOW (ref 8.9–10.3)
Chloride: 109 mmol/L (ref 98–111)
Creatinine, Ser: 2.46 mg/dL — ABNORMAL HIGH (ref 0.44–1.00)
GFR, Estimated: 19 mL/min — ABNORMAL LOW (ref >60)
Glucose, Bld: 265 mg/dL — ABNORMAL HIGH (ref 70–99)
Potassium: 4.6 mmol/L (ref 3.5–5.1)
Sodium: 138 mmol/L (ref 135–145)

## 2023-02-12 LAB — GLUCOSE, CAPILLARY
Glucose-Capillary: 205 mg/dL — ABNORMAL HIGH (ref 70–99)
Glucose-Capillary: 229 mg/dL — ABNORMAL HIGH (ref 70–99)
Glucose-Capillary: 132 mg/dL — ABNORMAL HIGH (ref 70–99)
Glucose-Capillary: 182 mg/dL — ABNORMAL HIGH (ref 70–99)

## 2023-02-12 LAB — CBC
HCT: 21.3 % — ABNORMAL LOW (ref 36.0–46.0)
Hemoglobin: 7 g/dL — ABNORMAL LOW (ref 12.0–15.0)
MCH: 32.1 pg (ref 26.0–34.0)
MCHC: 32.9 g/dL (ref 30.0–36.0)
MCV: 97.7 fL (ref 80.0–100.0)
Platelets: 184 10*3/uL (ref 150–400)
RBC: 2.18 MIL/uL — ABNORMAL LOW (ref 3.87–5.11)
RDW: 13 % (ref 11.5–15.5)
WBC: 13.5 10*3/uL — ABNORMAL HIGH (ref 4.0–10.5)
nRBC: 0 % (ref 0.0–0.2)

## 2023-02-12 MED ORDER — FAMOTIDINE 20 MG PO TABS
10.0000 mg | ORAL_TABLET | Freq: Every day | ORAL | Status: DC
  Administered 2023-02-13 – 2023-02-18 (×6): 10 mg via ORAL
  Filled 2023-02-12 (×6): qty 1

## 2023-02-12 NOTE — Progress Notes (Signed)
PROGRESS NOTE    Sandra [name]  YQM:[id number] DOB: 09/27/1933 DOA: 02/10/2023 PCP: [name], NP    Brief Narrative:  89 year old female with past medical history of HTN, T2DM, HLD, anxiety, history of CVA.  Patient has been in and out of the ER/hospital since 11/24.  On 11/24 she was in the ER, with diarrhea diagnosed with colitis.  Discharged with Cipro and Flagyl.  Patient had ongoing nausea vomiting despite antibiotics.  Admitted 11/30 - 12/10 with acute on chronic kidney disease..  Creatinine 5, baseline 2.7..  Patient treated with IV fluids and antibiotics, discharged home.  Patient continues to be weak.  Her diarrhea has resolved.  Tonight patient got up to go to the restroom.  Her daughter heard her screaming after she fell. Found to have hip fracture and plan for OR on 1/23.   Assessment and Plan: Subcapital fracture of hip, left, closed, initial encounter [facility]) -OR 1/23 -ortho consulted -pt/ot eval   Anxiety state -Celexa resumed -Klonopin as needed resumed   History of CVA -Hold plavix   Benign essential HTN -Hydralazine resumed. -Metoprolol held with patient's borderline BP   Diabetes mellitus type 2 with retinopathy (HCC) -Sliding scale insulin initiated   dyslipidemia -Atorvastatin resumed   Delirium precautions -PRN haldol   DVT prophylaxis: SCDs Start: 02/11/23 1530 heparin injection 5,000 Units Start: 02/11/23 1400    Code Status: Limited: Do not attempt resuscitation (DNR) -DNR-LIMITED -Do Not Intubate/DNI  Family Communication:   Disposition Plan:  Level of care: Telemetry Medical Status is: Inpatient     Consultants:  ortho   Subjective: confused  Objective: Vitals:   02/12/23 0455 02/12/23 0811 02/12/23 0856 02/12/23 1119  BP:  (!) 155/51 (!) 156/51 (!) 135/44  Pulse:   (!) 101 80  Resp: 14 (!) 22 14 18   Temp:  98.4 F (36.9 C) 97.7 F (36.5 C) 97.9 F (36.6 C)  TempSrc:  Oral Oral Axillary  SpO2:  99% 93% 90%   Weight:      Height:        Intake/Output Summary (Last 24 hours) at 02/12/2023 1145 Last data filed at 02/12/2023 1053 Gross per 24 hour  Intake 2375.9 ml  Output 1380 ml  Net 995.9 ml   Filed Weights   02/11/23 1051  Weight: 51 kg    Examination:   General: Appearance:    elderly female -- agitated at times     Lungs:     respirations unlabored  Heart:    Normal heart rate. Normal rhythm. No murmurs, rubs, or gallops.    MS:   All extremities are intact.    Neurologic:   Confused to place/time/situation       Data Reviewed: I have personally reviewed following labs and imaging studies  CBC: Recent Labs  Lab 02/10/23 1728 02/11/23 0419 02/12/23 0621  WBC 6.1 7.4 13.5*  NEUTROABS 3.9 6.0  --   HGB 8.9* 8.7* 7.0*  HCT 27.4* 27.0* 21.3*  MCV 96.5 97.5 97.7  PLT 233 223 184   Basic Metabolic Panel: Recent Labs  Lab 02/10/23 1728 02/10/23 2327 02/11/23 0419 02/12/23 0621  NA 136  --  137 138  K 4.3  --  4.6 4.6  CL 104  --  106 109  CO2 24  --  20* 20*  GLUCOSE 327*  --  287* 265*  BUN 29*  --  26* 31*  CREATININE 2.30* 2.22* 2.15* 2.46*  CALCIUM 8.5*  --  8.2* 7.9*  GFR: Estimated Creatinine Clearance: 11.6 mL/min (A) (by C-G formula based on SCr of 2.46 mg/dL (H)). Liver Function Tests: No results for input(s): "AST", "ALT", "ALKPHOS", "BILITOT", "PROT", "ALBUMIN" in the last 168 hours. No results for input(s): "LIPASE", "AMYLASE" in the last 168 hours. No results for input(s): "AMMONIA" in the last 168 hours. Coagulation Profile: Recent Labs  Lab 02/10/23 1728  INR 1.0   Cardiac Enzymes: No results for input(s): "CKTOTAL", "CKMB", "CKMBINDEX", "TROPONINI" in the last 168 hours. BNP (last 3 results) No results for input(s): "PROBNP" in the last 8760 hours. HbA1C: No results for input(s): "HGBA1C" in the last 72 hours. CBG: Recent Labs  Lab 02/11/23 1340 02/11/23 1732 02/11/23 2040 02/12/23 0804 02/12/23 1113  GLUCAP 165* 235* 289*  205* 229*   Lipid Profile: No results for input(s): "CHOL", "HDL", "LDLCALC", "TRIG", "CHOLHDL", "LDLDIRECT" in the last 72 hours. Thyroid Function Tests: No results for input(s): "TSH", "T4TOTAL", "FREET4", "T3FREE", "THYROIDAB" in the last 72 hours. Anemia Panel: No results for input(s): "VITAMINB12", "FOLATE", "FERRITIN", "TIBC", "IRON", "RETICCTPCT" in the last 72 hours. Sepsis Labs: No results for input(s): "PROCALCITON", "LATICACIDVEN" in the last 168 hours.  Recent Results (from the past 240 hours)  Surgical pcr screen     Status: None   Collection Time: 02/11/23 11:40 AM   Specimen: Nasal Mucosa; Nasal Swab  Result Value Ref Range Status   MRSA, PCR NEGATIVE NEGATIVE Final   Staphylococcus aureus NEGATIVE NEGATIVE Final    Comment: (NOTE) The Xpert SA Assay (FDA approved for NASAL specimens in patients 46 years of age and older), is one component of a comprehensive surveillance program. It is not intended to diagnose infection nor to guide or monitor treatment. Performed at [facility], [address], [place], [place] [address]          Radiology Studies: DG HIP UNILAT WITH PELVIS 1V LEFT Result Date: 02/11/2023 CLINICAL DATA:  Elective surgery. EXAM: DG HIP (WITH OR WITHOUT PELVIS) 1V*L* COMPARISON:  Preoperative imaging FINDINGS: Three fluoroscopic spot views of the pelvis and left hip obtained in the operating room. Images during hip arthroplasty. Fluoroscopy time 12 seconds. Dose 0.636 mGy. IMPRESSION: Procedural fluoroscopy during left hip arthroplasty. Electronically Signed   By: Narda Rutherford M.D.   On: 02/11/2023 13:29   DG C-Arm 1-60 Min-No Report Result Date: 02/11/2023 Fluoroscopy was utilized by the requesting physician.  No radiographic interpretation.   CT HIP LEFT WO CONTRAST Result Date: 02/10/2023 CLINICAL DATA:  Fall with left hip pain EXAM: CT OF THE LEFT HIP WITHOUT CONTRAST TECHNIQUE: Multidetector CT imaging of the left hip was  performed according to the standard protocol. Multiplanar CT image reconstructions were also generated. RADIATION DOSE REDUCTION: This exam was performed according to the departmental dose-optimization program which includes automated exposure control, adjustment of the mA and/or kV according to patient size and/or use of iterative reconstruction technique. COMPARISON:  02/10/2023 FINDINGS: Bones/Joint/Cartilage Acute mildly impacted and comminuted sub capital femoral neck fracture with mild apex anterior angulation. No femoral head dislocation. Pubic symphysis and rami appear intact. No SI joint widening. Ligaments Suboptimally assessed by CT. Muscles and Tendons No intramuscular fluid collections.  No significant atrophy Soft tissues Soft tissue edema about the left hip. Vascular calcifications in the pelvis. IMPRESSION: Acute mildly impacted and comminuted subcapital femoral neck fracture with mild apex anterior and medial angulation. Electronically Signed   By: Jasmine Pang M.D.   On: 02/10/2023 21:23   DG Pelvis 1-2 Views Result Date: 02/10/2023  CLINICAL DATA:  Fall EXAM: PELVIS - 1-2 VIEW COMPARISON:  CT 12/13/2022 FINDINGS: Acute left femoral neck fracture. No femoral head dislocation. Pubic symphysis and rami appear intact. Mild to moderate hip degenerative changes. Vascular calcifications IMPRESSION: Acute left femoral neck fracture. Electronically Signed   By: Jasmine Pang M.D.   On: 02/10/2023 19:56   DG Chest 1 View Result Date: 02/10/2023 CLINICAL DATA:  Fall with hip pain EXAM: CHEST  1 VIEW COMPARISON:  12/27/2022 FINDINGS: Hardware in the cervical spine. Low lung volumes. Chronic interstitial opacities. Linear areas of atelectasis or possible scarring at the lung bases. Stable cardiomediastinal silhouette with aortic atherosclerosis. No pneumothorax IMPRESSION: Low lung volumes with chronic interstitial opacities and linear areas of atelectasis or possible scarring at the lung bases.  Electronically Signed   By: Jasmine Pang M.D.   On: 02/10/2023 19:55   DG Knee 1-2 Views Left Result Date: 02/10/2023 CLINICAL DATA:  Fall with pain EXAM: LEFT KNEE - 1-2 VIEW COMPARISON:  None Available. FINDINGS: Limited by positioning. Sclerosis in the distal shaft and metaphysis of the femur suggestive of an infarct. No acute fracture or malalignment. No sizeable effusion IMPRESSION: 1. No acute osseous abnormality. Electronically Signed   By: Jasmine Pang M.D.   On: 02/10/2023 19:54   DG FEMUR MIN 2 VIEWS LEFT Result Date: 02/10/2023 CLINICAL DATA:  Fall left hip and sacral pain EXAM: LEFT FEMUR 2 VIEWS COMPARISON:  None Available. FINDINGS: Sclerosis in the distal shaft of the femur which may be due to infarct. Acute left femoral neck fracture. No femoral head dislocation. Vascular calcifications. IMPRESSION: Acute left femoral neck fracture. Electronically Signed   By: Jasmine Pang M.D.   On: 02/10/2023 19:53   CT HEAD WO CONTRAST Result Date: 02/10/2023 CLINICAL DATA:  Head and neck trauma from fall EXAM: CT HEAD WITHOUT CONTRAST CT CERVICAL SPINE WITHOUT CONTRAST TECHNIQUE: Multidetector CT imaging of the head and cervical spine was performed following the standard protocol without intravenous contrast. Multiplanar CT image reconstructions of the cervical spine were also generated. RADIATION DOSE REDUCTION: This exam was performed according to the departmental dose-optimization program which includes automated exposure control, adjustment of the mA and/or kV according to patient size and/or use of iterative reconstruction technique. COMPARISON:  CT and MRI 12/28/2022 and CT 11/15/2017 FINDINGS: CT HEAD FINDINGS Brain: No intracranial hemorrhage, mass effect, or evidence of acute infarct. No hydrocephalus. No extra-axial fluid collection. Age related cerebral atrophy and chronic small vessel ischemic disease. Chronic right occipital infarct. Vascular: No hyperdense vessel. Intracranial arterial  calcification. Skull: No fracture or focal lesion. Sinuses/Orbits: No acute finding. Other: None. CT CERVICAL SPINE FINDINGS Alignment: No evidence of traumatic malalignment. Skull base and vertebrae: No acute fracture. No primary bone lesion or focal pathologic process. Soft tissues and spinal canal: No prevertebral fluid or swelling. No visible canal hematoma. Disc levels: Unchanged multilevel spondylosis greatest at C5-C6. ACDF C6-C7. Mild spinal canal narrowing at C3-C4, C5-C6 and C6-C7 secondary to posterior disc osteophyte complexes. No severe spinal canal narrowing. Multilevel facet arthropathy. Upper chest: No acute abnormality. Other: Carotid calcification. IMPRESSION: 1. No acute intracranial abnormality. 2. No acute fracture in the cervical spine. Electronically Signed   By: Minerva Fester M.D.   On: 02/10/2023 19:47   CT CERVICAL SPINE WO CONTRAST Result Date: 02/10/2023 CLINICAL DATA:  Head and neck trauma from fall EXAM: CT HEAD WITHOUT CONTRAST CT CERVICAL SPINE WITHOUT CONTRAST TECHNIQUE: Multidetector CT imaging of the head and cervical spine was performed following  the standard protocol without intravenous contrast. Multiplanar CT image reconstructions of the cervical spine were also generated. RADIATION DOSE REDUCTION: This exam was performed according to the departmental dose-optimization program which includes automated exposure control, adjustment of the mA and/or kV according to patient size and/or use of iterative reconstruction technique. COMPARISON:  CT and MRI 12/28/2022 and CT 11/15/2017 FINDINGS: CT HEAD FINDINGS Brain: No intracranial hemorrhage, mass effect, or evidence of acute infarct. No hydrocephalus. No extra-axial fluid collection. Age related cerebral atrophy and chronic small vessel ischemic disease. Chronic right occipital infarct. Vascular: No hyperdense vessel. Intracranial arterial calcification. Skull: No fracture or focal lesion. Sinuses/Orbits: No acute finding.  Other: None. CT CERVICAL SPINE FINDINGS Alignment: No evidence of traumatic malalignment. Skull base and vertebrae: No acute fracture. No primary bone lesion or focal pathologic process. Soft tissues and spinal canal: No prevertebral fluid or swelling. No visible canal hematoma. Disc levels: Unchanged multilevel spondylosis greatest at C5-C6. ACDF C6-C7. Mild spinal canal narrowing at C3-C4, C5-C6 and C6-C7 secondary to posterior disc osteophyte complexes. No severe spinal canal narrowing. Multilevel facet arthropathy. Upper chest: No acute abnormality. Other: Carotid calcification. IMPRESSION: 1. No acute intracranial abnormality. 2. No acute fracture in the cervical spine. Electronically Signed   By: Minerva Fester M.D.   On: 02/10/2023 19:47        Scheduled Meds:  aspirin EC  325 mg Oral Q breakfast   atorvastatin  80 mg Oral Daily   clopidogrel  75 mg Oral Daily   docusate sodium  100 mg Oral BID   famotidine  40 mg Oral Daily   feeding supplement  237 mL Oral BID BM   heparin  5,000 Units Subcutaneous Q8H   hydrALAZINE  25 mg Oral TID   insulin aspart  0-15 Units Subcutaneous TID WC   insulin aspart  0-5 Units Subcutaneous QHS   metoprolol succinate  50 mg Oral Daily   Continuous Infusions:   LOS: 2 days    Time spent: 45 minutes spent on chart review, discussion with nursing staff, consultants, updating family and interview/physical exam; more than 50% of that time was spent in counseling and/or coordination of care.    Joseph Art, DO [facility] Available via Epic secure chat 7am-7pm After these hours, please refer to coverage provider listed on [email or web address] 02/12/2023, 11:45 AM

## 2023-02-12 NOTE — Progress Notes (Signed)
   02/12/23 1600  Urine Characteristics  Urinary Incontinence No  Urinary Interventions Bladder scan  Bladder Scan Volume (mL) 102 mL   Patient due to void at 1600 she has been unable to void. She was taken to bedside commode to attempt, pt denies pain Dr. Benjamine Mola notified and wanted to give patient more time to be able to void.

## 2023-02-12 NOTE — Progress Notes (Signed)
   02/12/23 1850  Urine Characteristics  Urinary Interventions Bladder scan  Bladder Scan Volume (mL) 156 mL   Pt bladder scanned and Dr. Benjamine Mola notified of amount.

## 2023-02-12 NOTE — Progress Notes (Signed)
Subjective: 1 Day Post-Op Procedure(s) (LRB): ANTERIOR APPROACH HEMI HIP ARTHROPLASTY (Left) Patient reports pain as mild to moderate.  Slow progress with PT.   Objective: Vital signs in last 24 hours: Temp:  [97.7 F (36.5 C)-98.4 F (36.9 C)] 98 F (36.7 C) (01/24 1519) Pulse Rate:  [65-112] 65 (01/24 1519) Resp:  [14-22] 18 (01/24 1519) BP: (132-165)/(44-111) 132/50 (01/24 1519) SpO2:  [90 %-99 %] 96 % (01/24 1519)  Intake/Output from previous day: 01/23 0701 - 01/24 0700 In: 2135.9 [P.O.:360; I.V.:1575.9; IV Piggyback:200] Out: 780 [Urine:730; Blood:50] Intake/Output this shift: Total I/O In: 240 [P.O.:240] Out: 600 [Urine:600]  Recent Labs    02/10/23 1728 02/11/23 0419 02/12/23 0621  HGB 8.9* 8.7* 7.0*   Recent Labs    02/11/23 0419 02/12/23 0621  WBC 7.4 13.5*  RBC 2.77* 2.18*  HCT 27.0* 21.3*  PLT 223 184   Recent Labs    02/11/23 0419 02/12/23 0621  NA 137 138  K 4.6 4.6  CL 106 109  CO2 20* 20*  BUN 26* 31*  CREATININE 2.15* 2.46*  GLUCOSE 287* 265*  CALCIUM 8.2* 7.9*   Recent Labs    02/10/23 1728  INR 1.0   Left lower extremity: Dorsiflexion/Plantar flexion intact Incision: dressing C/D/I Compartment soft   Assessment/Plan: 1 Day Post-Op Procedure(s) (LRB): ANTERIOR APPROACH HEMI HIP ARTHROPLASTY (Left) Up with therapy On aspirin and heparin for DVT prophylaxis        Bartholomew Ramesh 02/12/2023, 4:00 PM

## 2023-02-12 NOTE — Evaluation (Signed)
Occupational Therapy Evaluation Patient Details Name: Sandra [name] MRN: [id number] DOB: [birth year] Today's Date: 02/12/2023   History of Present Illness 89 y.o. female [facility] to [facility] 02/10/23 after falling w/ L subcapital neck fx. S/p L anterior direct hip hemiarthroplasty 1/23.  Multiple prior hospital visits since 11/24 for diarrhea and acute on CKD. PMH includes: DM2, diabetic retinopathy, essential HTN, CKD, TIA, depression   Clinical Impression   Patient is s/p L direct THA surgery resulting in functional limitations due to the deficits listed below (see OT problem list). Pt self reports living at home with daughter helping with medications no DME. Pt at this time limited mobility and requires (A) with bed mobility. Pt completed self feeding and grooming at bed level with increased time. Pt up in chair at this time and will require two person (A) transferring toward the R side. Patient will benefit from skilled OT acutely to increase independence and safety with ADLS to allow discharge skilled inpatient follow up therapy, <3 hours/day. No family present to confirm (A) level for d/c  Pt likes to be called Sandra [name]. (She likes juice and fruit to eat)        If plan is discharge home, recommend the following: A lot of help with walking and/or transfers;A lot of help with bathing/dressing/bathroom    Functional Status Assessment  Patient has had a recent decline in their functional status and demonstrates the ability to make significant improvements in function in a reasonable and predictable amount of time.  Equipment Recommendations  Other (comment);Wheelchair cushion (measurements OT);Wheelchair (measurements OT) (RW)    Recommendations for Other Services       Precautions / Restrictions Precautions Precautions: Fall (direct anterior hip) Restrictions Weight Bearing Restrictions Per Provider Order: No      Mobility Bed Mobility Overal bed mobility: Needs Assistance Bed  Mobility: Supine to Sit     Supine to sit: Max assist     General bed mobility comments: pt with pad used to help slide toward EOB. pt able to help advance R LE with cues to EOB. pt with (A) to bring LLE toward EOB. pt with helicopter method to come to sitting on EOB. pt required initial support of LLE and then able to lower to the floor. pt states " im a tough old bird"    Transfers Overall transfer level: Needs assistance Equipment used: 1 person hand held assist Transfers: Sit to/from Stand Sit to Stand: Max assist           General transfer comment: face to face stand pivot to chair      Balance Overall balance assessment: Needs assistance Sitting-balance support: Bilateral upper extremity supported, Feet supported Sitting balance-Leahy Scale: Poor     Standing balance support: Bilateral upper extremity supported, During functional activity, Reliant on assistive device for balance Standing balance-Leahy Scale: Poor                             ADL either performed or assessed with clinical judgement   ADL Overall ADL's : Needs assistance/impaired Eating/Feeding: Set up;Bed level   Grooming: Wash/dry face;Bed level   Upper Body Bathing: Minimal assistance;Bed level   Lower Body Bathing: Maximal assistance   Upper Body Dressing : Minimal assistance;Bed level Upper Body Dressing Details (indicate cue type and reason): don new gown . doff old gown Lower Body Dressing: Maximal assistance   Toilet Transfer: Maximal assistance Toilet Transfer Details (indicate cue type and reason):  face to face transfer. has a foley at this time           General ADL Comments: pt agreeable to oob to chair.     Vision Baseline Vision/History: 1 Wears glasses Ability to See in Adequate Light: 0 Adequate Patient Visual Report: No change from baseline Vision Assessment?: No apparent visual deficits     Perception         Praxis         Pertinent Vitals/Pain  Pain Assessment Pain Assessment: Faces Faces Pain Scale: Hurts whole lot Pain Location: L hip area Pain Descriptors / Indicators: Discomfort, Grimacing, Guarding Pain Intervention(s): Monitored during session, Limited activity within patient's tolerance, Repositioned, Patient requesting pain meds-RN notified (Rn arriving and giving medications at the end of session)     Extremity/Trunk Assessment Upper Extremity Assessment Upper Extremity Assessment: Overall WFL for tasks assessed   Lower Extremity Assessment Lower Extremity Assessment: Defer to PT evaluation   Cervical / Trunk Assessment Cervical / Trunk Assessment: Kyphotic   Communication Communication Communication: No apparent difficulties   Cognition Arousal: Alert Behavior During Therapy: WFL for tasks assessed/performed Overall Cognitive Status: Within Functional Limits for tasks assessed                                 General Comments: pt repeating information at times with some HOH . pt missing partial denture so speech mumbled at times     General Comments  incision dressing intact. Ice applied in chair with heating blanket to help pt tolerate ice    Exercises     Shoulder Instructions      Home Living Family/patient expects to be discharged to:: Private residence Living Arrangements: Children Available Help at Discharge: Family;Available 24 hours/day Type of Home: House Home Access: Stairs to enter [employer] of Steps: 2 Entrance Stairs-Rails: None Home Layout: One level     Bathroom Shower/Tub: Chief Strategy Officer: Standard     Home Equipment: Shower seat;Hand held shower head   Additional Comments: reports that she does not use DME      Prior Functioning/Environment Prior Level of Function : Independent/Modified Independent             Mobility Comments: no AD use ADLs Comments: sits for showers, has PRN assist from family for med mgmt/IADLs         OT Problem List: Decreased strength;Decreased activity tolerance;Impaired balance (sitting and/or standing);Decreased safety awareness;Decreased knowledge of use of DME or AE;Decreased knowledge of precautions;Pain      OT Treatment/Interventions: Self-care/ADL training;Therapeutic exercise;Neuromuscular education;DME and/or AE instruction;Manual therapy;Modalities;Therapeutic activities;Patient/family education;Balance training    OT Goals(Current goals can be found in the care plan section) Acute Rehab OT Goals Patient Stated Goal: to talk to [name] OT Goal Formulation: With patient Time For Goal Achievement: 02/26/23 Potential to Achieve Goals: Good  OT Frequency: Min 1X/week    Co-evaluation              AM-PAC OT "6 Clicks" Daily Activity     Outcome Measure Help from another person eating meals?: A Little Help from another person taking care of personal grooming?: A Little Help from another person toileting, which includes using toliet, bedpan, or urinal?: A Lot Help from another person bathing (including washing, rinsing, drying)?: A Lot Help from another person to put on and taking off regular upper body clothing?: A Little Help from another person to  put on and taking off regular lower body clothing?: A Lot 6 Click Score: 15   End of Session Equipment Utilized During Treatment: Gait belt Nurse Communication: Mobility status;Precautions;Weight bearing status  Activity Tolerance: Patient tolerated treatment well Patient left: in chair;with call bell/phone within reach;with chair alarm set;with nursing/sitter in room  OT Visit Diagnosis: Unsteadiness on feet (R26.81);Muscle weakness (generalized) (M62.81)                Time: 8119-1478 OT Time Calculation (min): 33 min Charges:  OT General Charges $OT Visit: 1 Visit OT Evaluation $OT Eval Moderate Complexity: 1 Mod OT Treatments $Self Care/Home Management : 8-22 mins   Brynn, OTR/L  Acute Rehabilitation  Services Office: [phone] .   Mateo Flow 02/12/2023, 9:56 AM

## 2023-02-12 NOTE — Evaluation (Signed)
Physical Therapy Evaluation Patient Details Name: Sandra [name] MRN: [id number] DOB: March 02, 1933 Today's Date: 02/12/2023  History of Present Illness  89 y.o. female presents to [facility] 02/10/23 after falling w/ L subcapital neck fx. S/p L anterior direct hip hemiarthroplasty 1/23.  Multiple prior hospital visits since 11/24 for diarrhea and acute on CKD. PMH includes: DM2, diabetic retinopathy, essential HTN, CKD, TIA, depression   Clinical Impression  Pt in recliner upon arrival and agreeable to PT eval. Prior to admit, pt was independent with no AD. Pt reports falling at home after moving too quickly. In today's session, pt was able to stand and pivot with MaxAx2 and [facility]. Pt had limited ability to bear weight on LLE and severe pain with minimal movement. Pt then required MaxAx2 to return to supine. Pt presents to therapy session with decreased strength, balance, and mobility. Pt would benefit from acute skilled PT to address functional impairments. Recommending post-acute rehab <3hrs to work towards independence with mobility. Acute PT to follow.          If plan is discharge home, recommend the following: A lot of help with walking and/or transfers;A lot of help with bathing/dressing/bathroom;Assistance with cooking/housework;Assist for transportation;Help with stairs or ramp for entrance   Can travel by private vehicle   No    Equipment Recommendations Other (comment) (TBD at next venue)     Functional Status Assessment Patient has had a recent decline in their functional status and demonstrates the ability to make significant improvements in function in a reasonable and predictable amount of time.     Precautions / Restrictions Precautions Precautions: Fall (direct anterior hip) Restrictions Weight Bearing Restrictions Per Provider Order: Yes LLE Weight Bearing Per Provider Order: Weight bearing as tolerated      Mobility  Bed Mobility Overal bed mobility: Needs Assistance Bed  Mobility: Sit to Supine       Sit to supine: +2 for physical assistance, Max assist   General bed mobility comments: MaxAx2 for sit/supine with assist for LE management and trunk descent. Pt able to minimally assist with lowering trunk    Transfers Overall transfer level: Needs assistance Equipment used: 2 person hand held assist Transfers: Sit to/from Stand, Bed to chair/wheelchair/BSC Sit to Stand: Max assist, +2 physical assistance, +2 safety/equipment Stand pivot transfers: Max assist, +2 physical assistance      General transfer comment: MaxAx2 to stand and pivot towards the bed with [facility] assist. Pt with severe pain at rest and minimal ability to bear weight on L LE for stand and transfer    Ambulation/Gait    General Gait Details: unable to take steps 2/2 pain     Balance Overall balance assessment: Needs assistance Sitting-balance support: Bilateral upper extremity supported, Feet supported Sitting balance-Leahy Scale: Poor     Standing balance support: Bilateral upper extremity supported, During functional activity, Reliant on assistive device for balance Standing balance-Leahy Scale: Poor Standing balance comment: reliant on UE support and physical assist       Pertinent Vitals/Pain Pain Assessment Pain Assessment: Faces Faces Pain Scale: Hurts worst Pain Location: L hip area Pain Descriptors / Indicators: Discomfort, Grimacing, Guarding Pain Intervention(s): Limited activity within patient's tolerance, Monitored during session, Repositioned, Premedicated before session    Home Living Family/patient expects to be discharged to:: Private residence Living Arrangements: Children Available Help at Discharge: Family;Available 24 hours/day Type of Home: House Home Access: Stairs to enter Entrance Stairs-Rails: None Entrance Stairs-Number of Steps: 2   Home Layout: One level Home  Equipment: Shower seat;Hand held shower head Additional Comments: reports that  she does not use DME    Prior Function Prior Level of Function : Independent/Modified Independent;History of Falls (last six months)    Mobility Comments: no AD use, pt reports getting up too quickly and falling. No falls prior to this recent one ADLs Comments: sits for showers, has PRN assist from family for med mgmt/IADLs     Extremity/Trunk Assessment   Upper Extremity Assessment Upper Extremity Assessment: Defer to OT evaluation    Lower Extremity Assessment Lower Extremity Assessment: Generalized weakness (L>R weakness, deferred formal MMT due to significant pain at rest. Denies numbness/tingling)    Cervical / Trunk Assessment Cervical / Trunk Assessment: Kyphotic  Communication   Communication Communication: No apparent difficulties  Cognition Arousal: Alert Behavior During Therapy: WFL for tasks assessed/performed Overall Cognitive Status: Within Functional Limits for tasks assessed     General Comments General comments (skin integrity, edema, etc.): VSS on RA     PT Assessment Patient needs continued PT services  PT Problem List Decreased strength;Decreased range of motion;Decreased activity tolerance;Decreased balance;Decreased mobility       PT Treatment Interventions DME instruction;Gait training;Functional mobility training;Therapeutic activities;Therapeutic exercise;Stair training;Balance training;Neuromuscular re-education;Patient/family education    PT Goals (Current goals can be found in the Care Plan section)  Acute Rehab PT Goals Patient Stated Goal: to get better PT Goal Formulation: With patient Time For Goal Achievement: 02/26/23 Potential to Achieve Goals: Good    Frequency Min 1X/week        AM-PAC PT "6 Clicks" Mobility  Outcome Measure Help needed turning from your back to your side while in a flat bed without using bedrails?: Total Help needed moving from lying on your back to sitting on the side of a flat bed without using bedrails?:  Total Help needed moving to and from a bed to a chair (including a wheelchair)?: Total Help needed standing up from a chair using your arms (e.g., wheelchair or bedside chair)?: Total Help needed to walk in hospital room?: Total Help needed climbing 3-5 steps with a railing? : Total 6 Click Score: 6    End of Session Equipment Utilized During Treatment: Gait belt Activity Tolerance: Patient limited by pain Patient left: in bed;with call bell/phone within reach;with bed alarm set Nurse Communication: Mobility status PT Visit Diagnosis: Unsteadiness on feet (R26.81);History of falling (Z91.81);Muscle weakness (generalized) (M62.81)    Time: 1610-9604 PT Time Calculation (min) (ACUTE ONLY): 14 min   Charges:   PT Evaluation $PT Eval Low Complexity: 1 Low   PT General Charges $$ ACUTE PT VISIT: 1 Visit         Hilton Cork, PT, DPT Secure Chat Preferred  Rehab Office [phone]   Arturo Morton Brion Aliment 02/12/2023, 1:23 PM

## 2023-02-13 DIAGNOSIS — S72012A Unspecified intracapsular fracture of left femur, initial encounter for closed fracture: Secondary | ICD-10-CM | POA: Diagnosis not present

## 2023-02-13 LAB — URINALYSIS, ROUTINE W REFLEX MICROSCOPIC
Bilirubin Urine: NEGATIVE
Glucose, UA: 50 mg/dL — AB
Ketones, ur: NEGATIVE mg/dL
Nitrite: NEGATIVE
Protein, ur: 100 mg/dL — AB
Specific Gravity, Urine: 1.017 (ref 1.005–1.030)
WBC, UA: >50 WBC/hpf (ref 0–5)
pH: 5 (ref 5.0–8.0)

## 2023-02-13 LAB — GLUCOSE, CAPILLARY
Glucose-Capillary: 190 mg/dL — ABNORMAL HIGH (ref 70–99)
Glucose-Capillary: 178 mg/dL — ABNORMAL HIGH (ref 70–99)
Glucose-Capillary: 153 mg/dL — ABNORMAL HIGH (ref 70–99)
Glucose-Capillary: 150 mg/dL — ABNORMAL HIGH (ref 70–99)

## 2023-02-13 LAB — CBC
HCT: 25.9 % — ABNORMAL LOW (ref 36.0–46.0)
Hemoglobin: 8.3 g/dL — ABNORMAL LOW (ref 12.0–15.0)
MCH: 31.2 pg (ref 26.0–34.0)
MCHC: 32 g/dL (ref 30.0–36.0)
MCV: 97.4 fL (ref 80.0–100.0)
Platelets: 230 10*3/uL (ref 150–400)
RBC: 2.66 MIL/uL — ABNORMAL LOW (ref 3.87–5.11)
RDW: 13.2 % (ref 11.5–15.5)
WBC: 13.6 10*3/uL — ABNORMAL HIGH (ref 4.0–10.5)
nRBC: 0 % (ref 0.0–0.2)

## 2023-02-13 LAB — BASIC METABOLIC PANEL
Anion gap: 12 (ref 5–15)
BUN: 35 mg/dL — ABNORMAL HIGH (ref 8–23)
CO2: 20 mmol/L — ABNORMAL LOW (ref 22–32)
Calcium: 8.5 mg/dL — ABNORMAL LOW (ref 8.9–10.3)
Chloride: 107 mmol/L (ref 98–111)
Creatinine, Ser: 2.5 mg/dL — ABNORMAL HIGH (ref 0.44–1.00)
GFR, Estimated: 18 mL/min — ABNORMAL LOW (ref >60)
Glucose, Bld: 202 mg/dL — ABNORMAL HIGH (ref 70–99)
Potassium: 4.2 mmol/L (ref 3.5–5.1)
Sodium: 139 mmol/L (ref 135–145)

## 2023-02-13 MED ORDER — OXYCODONE HCL 5 MG PO TABS: 5.0000 mg | ORAL_TABLET | ORAL | Status: DC | PRN

## 2023-02-13 MED ORDER — OLANZAPINE 5 MG PO TBDP
5.0000 mg | ORAL_TABLET | Freq: Every day | ORAL | Status: DC
Start: 2023-02-13 — End: 2023-02-14
  Filled 2023-02-13 (×2): qty 1

## 2023-02-13 MED ORDER — ACETAMINOPHEN 500 MG PO TABS
1000.0000 mg | ORAL_TABLET | Freq: Three times a day (TID) | ORAL | Status: DC
  Administered 2023-02-13 – 2023-02-18 (×15): 1000 mg via ORAL
  Filled 2023-02-13 (×16): qty 2

## 2023-02-13 MED ORDER — HYDROMORPHONE HCL 1 MG/ML IJ SOLN
0.5000 mg | INTRAMUSCULAR | Status: DC | PRN
  Administered 2023-02-13: 0.5 mg via INTRAVENOUS
  Filled 2023-02-13: qty 0.5

## 2023-02-13 MED ORDER — SODIUM CHLORIDE 0.9 % IV SOLN
1.0000 g | INTRAVENOUS | Status: DC
  Administered 2023-02-13 – 2023-02-16 (×3): 1 g via INTRAVENOUS
  Filled 2023-02-13 (×3): qty 10

## 2023-02-13 NOTE — TOC Initial Note (Signed)
Transition of Care [facility]) - Initial/Assessment Note    Patient Details  Name: Sandra [name] MRN: [id number] Date of Birth: [birth year]  Transition of Care [facility]) CM/SW Contact:    Carley Hammed, LCSW Phone Number: 02/13/2023, 11:29 AM  Clinical Narrative:                 CSW spoke with pt briefly, noting she is unable to participate in assessment at this time due to disorientation. Pt's grandson at bedside made aware of SNF recommendation and is agreeable. Pt has never been before and family has no preference other than to remain in [place]. Grandson notes his Aunt, pt's dtr, will be available this coming week and this afternoon, he will pass along information. CSW advised of Medicare.gov ratings so family can research options. CSW to complete workup and send out referrals. TOC will continue to follow.   Expected Discharge Plan: Skilled Nursing Facility Barriers to Discharge: Continued Medical Work up, SNF Pending bed offer, Insurance Authorization   Patient Goals and CMS Choice Patient states their goals for this hospitalization and ongoing recovery are:: Pt dispriented at this time, unable to participate in goal setting. CMS Medicare.gov Compare Post Acute Care list provided to:: Patient Represenative (must comment) Choice offered to / list presented to : Adult Children Technical sales engineer)      Expected Discharge Plan and Services In-house Referral: Clinical Social Work   Post Acute Care Choice: Skilled Nursing Facility Living arrangements for the past 2 months: Single Family Home                                      Prior Living Arrangements/Services Living arrangements for the past 2 months: Single Family Home Lives with:: Self Patient language and need for interpreter reviewed:: Yes Do you feel safe going back to the place where you live?: Yes      Need for Family Participation in Patient Care: Yes (Comment) Care giver support system in place?: Yes (comment)   Criminal  Activity/Legal Involvement Pertinent to Current Situation/Hospitalization: No - Comment as needed  Activities of Daily Living   ADL Screening (condition at time of admission) Independently performs ADLs?: Yes (appropriate for developmental age) Is the patient deaf or have difficulty hearing?: Yes Does the patient have difficulty seeing, even when wearing glasses/contacts?: Yes Does the patient have difficulty concentrating, remembering, or making decisions?: No  Permission Sought/Granted Permission sought to share information with : Family Supports Permission granted to share information with : Yes, Verbal Permission Granted  Share Information with NAME: [name]     Permission granted to share info w Relationship: Daughter     Emotional Assessment Appearance:: Appears stated age Attitude/Demeanor/Rapport: Unable to Assess Affect (typically observed): Anxious Orientation: : Oriented to Self, Oriented to Place Alcohol / Substance Use: Not Applicable Psych Involvement: No (comment)  Admission diagnosis:  Left displaced femoral neck fracture (HCC) [S72.002A] Fall, initial encounter [W19.XXXA] Subcapital fracture of hip, left, closed, initial encounter (HCC) [S72.012A] Chronic kidney disease, unspecified CKD stage [N18.9] Patient Active Problem List   Diagnosis Date Noted   Subcapital fracture of hip, left, closed, initial encounter (HCC) 02/10/2023   Pulmonary hypertension (HCC) 01/19/2023   Seizure disorder, 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   AMS (altered mental status) 12/29/2022   Malnutrition of moderate degree 12/22/2022   Acute renal failure superimposed on stage 5 chronic kidney disease, not on chronic dialysis (HCC) 12/19/2022  Edema 08/21/2022   Kidney disease 06/02/2022   DNR (do not resuscitate) 01/04/2022   Type 2 diabetes mellitus with chronic kidney disease, with long-term current use of insulin (HCC) 03/18/2021   Anxiety 03/18/2021   Dyslipidemia 03/18/2021    Depression 03/18/2021   Uncontrolled type 2 diabetes mellitus with hyperglycemia (HCC)    Benign essential HTN    Hypoalbuminemia due to protein-calorie malnutrition (HCC)    Macrocytosis    Diabetic retinopathy of left eye associated with type 2 diabetes mellitus (HCC)    Cognitive impairment 06/22/2019   ICH (intracerebral hemorrhage) (HCC) - R occipital - HTN vs CAA 06/18/2019   TIA (transient ischemic attack) 04/26/2019   Scleroderma (HCC) 02/28/2016   Dyspnea 02/10/2016   Abnormal blood finding 02/10/2016   CAD (coronary artery disease) 02/10/2016   Osteoarthritis 02/10/2016   Nodule on liver 12/06/2015   ILD (interstitial lung disease) (HCC) 05/31/2015   Type 2 diabetes mellitus with hyperglycemia (HCC) 03/11/2015   Diabetes mellitus type 2 with retinopathy (HCC) 03/04/2015   Vitamin D deficiency 10/01/2014   Leukocytopenia 05/31/2014   General weakness 05/31/2014   Lower abdominal pain 05/17/2014   Chronic right SI joint pain 09/22/2013   Vasculitis of skin 09/28/2012   History of subarachnoid hemorrhage 04/07/2012   Cerebral artery occlusion with cerebral infarction (HCC) 04/07/2012   Achilles tendon injury 11/27/2010   Back pain 07/01/2010   Breast pain 04/15/2010   GAIT DISTURBANCE 08/07/2009   Anxiety state 07/03/2009   VALVULAR HEART DISEASE 05/23/2008   NECK PAIN, CHRONIC 04/03/2008   DEGENERATIVE JOINT DISEASE, CERVICAL SPINE 06/23/2006   Osteoporosis 06/23/2006   Hyperlipidemia associated with type 2 diabetes mellitus (HCC) 05/12/2006   DEPRESSION 05/12/2006   Essential hypertension 05/12/2006   ALLERGIC RHINITIS 05/12/2006   ACID REFLUX DISEASE 05/12/2006   SCIATICA 05/12/2006   PCP:  [name], NP Pharmacy:   [id number] - [place], [place] - [address] [place] [address] Phone: [phone] Fax: [phone]  [facility] - [place], [place] - [address] [place]  [address] Phone: [phone] Fax: [phone]     Social Drivers of Health (SDOH) Social History: SDOH Screenings   Food Insecurity: No Food Insecurity (02/11/2023)  Recent Concern: Food Insecurity - Food Insecurity Present (12/19/2022)  Housing: Low Risk  (02/11/2023)  Transportation Needs: No Transportation Needs (02/11/2023)  Utilities: Not At Risk (02/11/2023)  Depression (PHQ2-9): Low Risk  (01/19/2023)  Social Connections: Socially Isolated (02/11/2023)  Tobacco Use: Medium Risk (02/11/2023)   SDOH Interventions:     Readmission Risk Interventions    12/21/2022   12:26 PM  Readmission Risk Prevention Plan  Transportation Screening Complete  PCP or Specialist Appt within 3-5 Days Complete  HRI or Home Care Consult Complete  Social Work Consult for Recovery Care Planning/Counseling Complete  Palliative Care Screening Not Applicable  Medication Review Oceanographer) Complete

## 2023-02-13 NOTE — [place] FL2 (Signed)
[place] MEDICAID FL2 LEVEL OF CARE FORM     IDENTIFICATION  Patient Name: Sandra [name] Birthdate: 1933-10-27 Sex: female Admission Date (Current Location): 02/10/2023  [facility] and [place] Number:  Producer, television/film/video and Address:  [facility], [address], [place], [place] [address]      Provider Number: [id number]  Attending Physician Name and Address:  Joseph Art, DO  Relative Name and Phone Number:       Current Level of Care: Hospital Recommended Level of Care: Skilled Nursing Facility Prior Approval Number:    Date Approved/Denied:   PASRR Number: [id number]  Discharge Plan: SNF    Current Diagnoses: Patient Active Problem List   Diagnosis Date Noted   Subcapital fracture of hip, left, closed, initial encounter (HCC) 02/10/2023   Pulmonary hypertension (HCC) 01/19/2023   Seizure disorder, 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   AMS (altered mental status) 12/29/2022   Malnutrition of moderate degree 12/22/2022   Acute renal failure superimposed on stage 5 chronic kidney disease, not on chronic dialysis (HCC) 12/19/2022   Edema 08/21/2022   Kidney disease 06/02/2022   DNR (do not resuscitate) 01/04/2022   Type 2 diabetes mellitus with chronic kidney disease, with long-term current use of insulin (HCC) 03/18/2021   Anxiety 03/18/2021   Dyslipidemia 03/18/2021   Depression 03/18/2021   Uncontrolled type 2 diabetes mellitus with hyperglycemia (HCC)    Benign essential HTN    Hypoalbuminemia due to protein-calorie malnutrition (HCC)    Macrocytosis    Diabetic retinopathy of left eye associated with type 2 diabetes mellitus (HCC)    Cognitive impairment 06/22/2019   ICH (intracerebral hemorrhage) (HCC) - R occipital - HTN vs CAA 06/18/2019   TIA (transient ischemic attack) 04/26/2019   Scleroderma (HCC) 02/28/2016   Dyspnea 02/10/2016   Abnormal blood finding 02/10/2016   CAD (coronary artery disease) 02/10/2016   Osteoarthritis  02/10/2016   Nodule on liver 12/06/2015   ILD (interstitial lung disease) (HCC) 05/31/2015   Type 2 diabetes mellitus with hyperglycemia (HCC) 03/11/2015   Diabetes mellitus type 2 with retinopathy (HCC) 03/04/2015   Vitamin D deficiency 10/01/2014   Leukocytopenia 05/31/2014   General weakness 05/31/2014   Lower abdominal pain 05/17/2014   Chronic right SI joint pain 09/22/2013   Vasculitis of skin 09/28/2012   History of subarachnoid hemorrhage 04/07/2012   Cerebral artery occlusion with cerebral infarction (HCC) 04/07/2012   Achilles tendon injury 11/27/2010   Back pain 07/01/2010   Breast pain 04/15/2010   GAIT DISTURBANCE 08/07/2009   Anxiety state 07/03/2009   VALVULAR HEART DISEASE 05/23/2008   NECK PAIN, CHRONIC 04/03/2008   DEGENERATIVE JOINT DISEASE, CERVICAL SPINE 06/23/2006   Osteoporosis 06/23/2006   Hyperlipidemia associated with type 2 diabetes mellitus (HCC) 05/12/2006   DEPRESSION 05/12/2006   Essential hypertension 05/12/2006   ALLERGIC RHINITIS 05/12/2006   ACID REFLUX DISEASE 05/12/2006   SCIATICA 05/12/2006    Orientation RESPIRATION BLADDER Height & Weight     Self, Place  Normal Continent Weight: 112 lb 7 oz (51 kg) Height:  5' (152.4 cm)  BEHAVIORAL SYMPTOMS/MOOD NEUROLOGICAL BOWEL NUTRITION STATUS      Continent Diet (See DC summary)  AMBULATORY STATUS COMMUNICATION OF NEEDS Skin   Extensive Assist Verbally Surgical wounds (L hip incision)                       Personal Care Assistance Level of Assistance  Bathing, Feeding, Dressing Bathing Assistance: Maximum assistance  Feeding assistance: Limited assistance Dressing Assistance: Maximum assistance     Functional Limitations Info  Sight, Hearing, Speech Sight Info: Adequate Hearing Info: Adequate Speech Info: Adequate    SPECIAL CARE FACTORS FREQUENCY  PT (By licensed PT), OT (By licensed OT)     PT Frequency: 5x week OT Frequency: 5x week            Contractures  Contractures Info: Not present    Additional Factors Info  Code Status, Allergies, Insulin Sliding Scale Code Status Info: DNR Allergies Info: Cephalexin  Pioglitazone  Nintedanib  Sertraline   Insulin Sliding Scale Info: See DC summary       Current Medications (02/13/2023):  This is the current hospital active medication list Current Facility-Administered Medications  Medication Dose Route Frequency Provider Last Rate Last Admin   acetaminophen (TYLENOL) tablet 650 mg  650 mg Oral Q6H PRN Kathryne Hitch, MD   650 mg at 02/13/23 1610   Or   acetaminophen (TYLENOL) suppository 650 mg  650 mg Rectal Q6H PRN Kathryne Hitch, MD       alum & mag hydroxide-simeth (MAALOX/MYLANTA) 200-200-20 MG/5ML suspension 30 mL  30 mL Oral Q4H PRN Kathryne Hitch, MD       aspirin EC tablet 325 mg  325 mg Oral Q breakfast Kathryne Hitch, MD   325 mg at 02/13/23 0853   atorvastatin (LIPITOR) tablet 80 mg  80 mg Oral Daily Kathryne Hitch, MD   80 mg at 02/13/23 9604   clonazePAM (KLONOPIN) tablet 0.5 mg  0.5 mg Oral BID PRN Kathryne Hitch, MD   0.5 mg at 02/13/23 0831   clopidogrel (PLAVIX) tablet 75 mg  75 mg Oral Daily Kathryne Hitch, MD   75 mg at 02/13/23 5409   diphenhydrAMINE (BENADRYL) 12.5 MG/5ML elixir 12.5-25 mg  12.5-25 mg Oral Q4H PRN Kathryne Hitch, MD       docusate sodium (COLACE) capsule 100 mg  100 mg Oral BID Kathryne Hitch, MD   100 mg at 02/13/23 0853   famotidine (PEPCID) tablet 10 mg  10 mg Oral Daily Marlin Canary U, DO   10 mg at 02/13/23 8119   feeding supplement (ENSURE ENLIVE / ENSURE PLUS) liquid 237 mL  237 mL Oral BID BM Vann, Mera Gunkel U, DO   237 mL at 02/12/23 1158   haloperidol lactate (HALDOL) injection 2 mg  2 mg Intramuscular Q6H PRN Kathryne Hitch, MD   2 mg at 02/12/23 2242   heparin injection 5,000 Units  5,000 Units Subcutaneous Q8H Kathryne Hitch, MD   5,000 Units at 02/13/23  0546   hydrALAZINE (APRESOLINE) tablet 25 mg  25 mg Oral TID Kathryne Hitch, MD   25 mg at 02/13/23 0853   HYDROcodone-acetaminophen (NORCO) 7.5-325 MG per tablet 1-2 tablet  1-2 tablet Oral Q4H PRN Kathryne Hitch, MD       HYDROcodone-acetaminophen (NORCO/VICODIN) 5-325 MG per tablet 1-2 tablet  1-2 tablet Oral Q4H PRN Kathryne Hitch, MD   2 tablet at 02/12/23 1301   HYDROmorphone (DILAUDID) injection 0.5 mg  0.5 mg Intravenous Q3H PRN Kathryne Hitch, MD   0.5 mg at 02/13/23 0852   insulin aspart (novoLOG) injection 0-15 Units  0-15 Units Subcutaneous TID WC Kathryne Hitch, MD   5 Units at 02/12/23 1159   insulin aspart (novoLOG) injection 0-5 Units  0-5 Units Subcutaneous QHS Kathryne Hitch, MD   3 Units at 02/11/23  2200   menthol-cetylpyridinium (CEPACOL) lozenge 3 mg  1 lozenge Oral PRN Kathryne Hitch, MD       Or   phenol (CHLORASEPTIC) mouth spray 1 spray  1 spray Mouth/Throat PRN Kathryne Hitch, MD       methocarbamol (ROBAXIN) tablet 500 mg  500 mg Oral Q6H PRN Kathryne Hitch, MD       Or   methocarbamol (ROBAXIN) injection 500 mg  500 mg Intravenous Q6H PRN Kathryne Hitch, MD   500 mg at 02/13/23 0021   metoCLOPramide (REGLAN) tablet 5-10 mg  5-10 mg Oral Q8H PRN Kathryne Hitch, MD       Or   metoCLOPramide (REGLAN) injection 5-10 mg  5-10 mg Intravenous Q8H PRN Kathryne Hitch, MD       metoprolol succinate (TOPROL-XL) 24 hr tablet 50 mg  50 mg Oral Daily Kathryne Hitch, MD   50 mg at 02/13/23 0853   morphine (PF) 2 MG/ML injection 0.5-1 mg  0.5-1 mg Intravenous Q2H PRN Kathryne Hitch, MD       ondansetron [facility]) tablet 4 mg  4 mg Oral Q6H PRN Kathryne Hitch, MD       Or   ondansetron [facility]) injection 4 mg  4 mg Intravenous Q6H PRN Kathryne Hitch, MD         Discharge Medications: Please see discharge summary for a list of discharge  medications.  Relevant Imaging Results:  Relevant Lab Results:   Additional Information SS# [id number], [place]

## 2023-02-13 NOTE — Progress Notes (Signed)
PROGRESS NOTE    Sandra [name]  ZOX:[id number] DOB: 1933-10-03 DOA: 02/10/2023 PCP: [name], NP    Brief Narrative:  89 year old female with past medical history of HTN, T2DM, HLD, anxiety, history of CVA.  Patient has been in and out of the ER/hospital since 11/24.  On 11/24 she was in the ER, with diarrhea diagnosed with colitis.  Discharged with Cipro and Flagyl.  Patient had ongoing nausea vomiting despite antibiotics.  Admitted 11/30 - 12/10 with acute on chronic kidney disease..  Creatinine 5, baseline 2.7..  Patient treated with IV fluids and antibiotics, discharged home.  Patient continues to be weak.  Her diarrhea has resolved.  Tonight patient got up to go to the restroom.  Her daughter heard her screaming after she fell. Found to have hip fracture and plan for OR on 1/23.   Assessment and Plan: Subcapital fracture of hip, left, closed, initial encounter [facility]) -OR 1/23 -ortho consulted -pt/ot eval- SNF -schedule tylenol, PRN oxy   Anxiety state -Celexa resumed -Klonopin as needed resumed   History of CVA -resume plavix   Benign essential HTN -Hydralazine resumed. -Metoprolol held with patient's borderline BP   Diabetes mellitus type 2 with retinopathy (HCC) -Sliding scale insulin initiated   dyslipidemia -Atorvastatin resumed   Delirium precautions -zyprexa QHS  CKD stage IV -stable  DVT prophylaxis: SCDs Start: 02/11/23 1530 heparin injection 5,000 Units Start: 02/11/23 1400    Code Status: Limited: Do not attempt resuscitation (DNR) -DNR-LIMITED -Do Not Intubate/DNI  Family Communication:   Disposition Plan:  Level of care: Med-Surg Status is: Inpatient     Consultants:  ortho   Subjective: Sleepy this AM as she got IV dilaudid   Objective: Vitals:   02/12/23 1952 02/12/23 1956 02/13/23 0853 02/13/23 0854  BP: (!) 154/51 121/73 (!) 140/47 (!) 140/47  Pulse: 91 69 88 86  Resp: 18 18 20 20   Temp: 97.6 F (36.4 C) 98.1 F (36.7  C) 97.8 F (36.6 C) 97.8 F (36.6 C)  TempSrc: Oral Oral    SpO2: 94% 98% 93% 93%  Weight:      Height:        Intake/Output Summary (Last 24 hours) at 02/13/2023 1315 Last data filed at 02/12/2023 1827 Gross per 24 hour  Intake 60 ml  Output --  Net 60 ml   Filed Weights   02/11/23 1051  Weight: 51 kg    Examination:   In bed, sleepy    Data Reviewed: I have personally reviewed following labs and imaging studies  CBC: Recent Labs  Lab 02/10/23 1728 02/11/23 0419 02/12/23 0621 02/13/23 0549  WBC 6.1 7.4 13.5* 13.6*  NEUTROABS 3.9 6.0  --   --   HGB 8.9* 8.7* 7.0* 8.3*  HCT 27.4* 27.0* 21.3* 25.9*  MCV 96.5 97.5 97.7 97.4  PLT 233 223 184 230   Basic Metabolic Panel: Recent Labs  Lab 02/10/23 1728 02/10/23 2327 02/11/23 0419 02/12/23 0621 02/13/23 0549  NA 136  --  137 138 139  K 4.3  --  4.6 4.6 4.2  CL 104  --  106 109 107  CO2 24  --  20* 20* 20*  GLUCOSE 327*  --  287* 265* 202*  BUN 29*  --  26* 31* 35*  CREATININE 2.30* 2.22* 2.15* 2.46* 2.50*  CALCIUM 8.5*  --  8.2* 7.9* 8.5*   GFR: Estimated Creatinine Clearance: 11.4 mL/min (A) (by C-G formula based on SCr of 2.5 mg/dL (H)). Liver Function Tests:  No results for input(s): "AST", "ALT", "ALKPHOS", "BILITOT", "PROT", "ALBUMIN" in the last 168 hours. No results for input(s): "LIPASE", "AMYLASE" in the last 168 hours. No results for input(s): "AMMONIA" in the last 168 hours. Coagulation Profile: Recent Labs  Lab 02/10/23 1728  INR 1.0   Cardiac Enzymes: No results for input(s): "CKTOTAL", "CKMB", "CKMBINDEX", "TROPONINI" in the last 168 hours. BNP (last 3 results) No results for input(s): "PROBNP" in the last 8760 hours. HbA1C: No results for input(s): "HGBA1C" in the last 72 hours. CBG: Recent Labs  Lab 02/12/23 1113 02/12/23 1654 02/12/23 2020 02/13/23 0842 02/13/23 1155  GLUCAP 229* 132* 182* 190* 178*   Lipid Profile: No results for input(s): "CHOL", "HDL", "LDLCALC",  "TRIG", "CHOLHDL", "LDLDIRECT" in the last 72 hours. Thyroid Function Tests: No results for input(s): "TSH", "T4TOTAL", "FREET4", "T3FREE", "THYROIDAB" in the last 72 hours. Anemia Panel: No results for input(s): "VITAMINB12", "FOLATE", "FERRITIN", "TIBC", "IRON", "RETICCTPCT" in the last 72 hours. Sepsis Labs: No results for input(s): "PROCALCITON", "LATICACIDVEN" in the last 168 hours.  Recent Results (from the past 240 hours)  Surgical pcr screen     Status: None   Collection Time: 02/11/23 11:40 AM   Specimen: Nasal Mucosa; Nasal Swab  Result Value Ref Range Status   MRSA, PCR NEGATIVE NEGATIVE Final   Staphylococcus aureus NEGATIVE NEGATIVE Final    Comment: (NOTE) The Xpert SA Assay (FDA approved for NASAL specimens in patients 66 years of age and older), is one component of a comprehensive surveillance program. It is not intended to diagnose infection nor to guide or monitor treatment. Performed at [facility], [address] [place], [place] [address]          Radiology Studies: No results found.       Scheduled Meds:  acetaminophen  1,000 mg Oral TID   aspirin EC  325 mg Oral Q breakfast   atorvastatin  80 mg Oral Daily   clopidogrel  75 mg Oral Daily   docusate sodium  100 mg Oral BID   famotidine  10 mg Oral Daily   feeding supplement  237 mL Oral BID BM   heparin  5,000 Units Subcutaneous Q8H   hydrALAZINE  25 mg Oral TID   insulin aspart  0-15 Units Subcutaneous TID WC   insulin aspart  0-5 Units Subcutaneous QHS   metoprolol succinate  50 mg Oral Daily   Continuous Infusions:   LOS: 3 days    Time spent: 45 minutes spent on chart review, discussion with nursing staff, consultants, updating family and interview/physical exam; more than 50% of that time was spent in counseling and/or coordination of care.    Joseph Art, DO [facility] Available via Epic secure chat 7am-7pm After these hours, please refer to coverage  provider listed on [email or web address] 02/13/2023, 1:15 PM

## 2023-02-14 ENCOUNTER — Inpatient Hospital Stay (HOSPITAL_COMMUNITY): Payer: Medicare HMO

## 2023-02-14 DIAGNOSIS — S72012A Unspecified intracapsular fracture of left femur, initial encounter for closed fracture: Secondary | ICD-10-CM | POA: Diagnosis not present

## 2023-02-14 LAB — GLUCOSE, CAPILLARY
Glucose-Capillary: 208 mg/dL — ABNORMAL HIGH (ref 70–99)
Glucose-Capillary: 247 mg/dL — ABNORMAL HIGH (ref 70–99)
Glucose-Capillary: 250 mg/dL — ABNORMAL HIGH (ref 70–99)
Glucose-Capillary: 229 mg/dL — ABNORMAL HIGH (ref 70–99)

## 2023-02-14 MED ORDER — POLYETHYLENE GLYCOL 3350 17 G PO PACK
17.0000 g | PACK | Freq: Every day | ORAL | Status: DC
  Administered 2023-02-16: 17 g via ORAL
  Filled 2023-02-14 (×5): qty 1

## 2023-02-14 MED ORDER — HALOPERIDOL LACTATE 5 MG/ML IJ SOLN
2.0000 mg | Freq: Four times a day (QID) | INTRAMUSCULAR | Status: DC | PRN
  Administered 2023-02-14: 2 mg via INTRAMUSCULAR
  Filled 2023-02-14: qty 1

## 2023-02-14 MED ORDER — HALOPERIDOL LACTATE 5 MG/ML IJ SOLN: 2.0000 mg | Freq: Four times a day (QID) | INTRAMUSCULAR | Status: DC | PRN

## 2023-02-14 NOTE — Progress Notes (Addendum)
Patient with unwitnessed fall-- will get CT scan of head, neck and hip as unclear what injuries occurred.  Patient was confused/combative  prior to fall.  Will get safety sitter and failed tele and bed alarm.   -will hold heparin for today and reeval in the AM  Head/neck CT stable sans a hematoma.  CT scan of hip shows Total left hip arthroplasty with periprosthetic minimally  displaced acute intertrochanteric fracture.  Spoke with on-call for Dr. Magnus Ivan-- can WBAT, looks to be non-operative, Dr. Magnus Ivan to review in AM again,  JV  DAughter updated but she says patient's nephew is MPOA: Lovett Sox- [phone]  Will update him as well  Marlin Canary DO

## 2023-02-14 NOTE — Progress Notes (Signed)
PROGRESS NOTE    Sandra [name]  ZOX:[id number] DOB: 1933-08-20 DOA: 02/10/2023 PCP: [name], NP    Brief Narrative:  89 year old female with past medical history of HTN, T2DM, HLD, anxiety, history of CVA.  Patient has been in and out of the ER/hospital since 11/24.  On 11/24 she was in the ER, with diarrhea diagnosed with colitis.  Discharged with Cipro and Flagyl.  Patient had ongoing nausea vomiting despite antibiotics.  Admitted 11/30 - 12/10 with acute on chronic kidney disease..  Creatinine 5, baseline 2.7..  Patient treated with IV fluids and antibiotics, discharged home.  Patient continues to be weak.  Her diarrhea has resolved.  Tonight patient got up to go to the restroom.  Her daughter heard her screaming after she fell. Found to have hip fracture and plan for OR on 1/23.   Assessment and Plan: Subcapital fracture of hip, left, closed, initial encounter [facility]) -OR 1/23 -ortho consulted -pt/ot eval- SNF -schedule tylenol, PRN oxy   UTI -culture pending -IV abx  Anxiety state -Celexa resumed -Klonopin as needed resumed   History of CVA -resume plavix   Benign essential HTN -Hydralazine resumed. -Metoprolol held with patient's borderline BP   Diabetes mellitus type 2 with retinopathy (HCC) -Sliding scale insulin initiated   dyslipidemia -Atorvastatin resumed   Delirium precautions -zyprexa QHS  CKD stage IV -stable  DVT prophylaxis: SCDs Start: 02/11/23 1530 heparin injection 5,000 Units Start: 02/11/23 1400    Code Status: Limited: Do not attempt resuscitation (DNR) -DNR-LIMITED -Do Not Intubate/DNI    Disposition Plan:  Level of care: Med-Surg Status is: Inpatient     Consultants:  ortho   Subjective: Needs to go to the bathroom  Objective: Vitals:   02/13/23 1532 02/13/23 2142 02/14/23 0510 02/14/23 0726  BP: (!) 139/52 (!) 146/62 (!) 177/65 (!) 186/60  Pulse: 77 88 85 86  Resp:  16 16 17   Temp: 98.4 F (36.9 C) 97.9 F (36.6  C) 98 F (36.7 C) 98.1 F (36.7 C)  TempSrc:      SpO2:  94% 100% 97%  Weight:      Height:        Intake/Output Summary (Last 24 hours) at 02/14/2023 1210 Last data filed at 02/14/2023 1000 Gross per 24 hour  Intake 220 ml  Output 300 ml  Net -80 ml   Filed Weights   02/11/23 1051  Weight: 51 kg    Examination:    General: Appearance:    Well developed, well nourished female in no acute distress     Lungs:      respirations unlabored  Heart:    Normal heart rate. Normal rhythm. No murmurs, rubs, or gallops.   MS:   All extremities are intact.   Neurologic:   Awake, alert       Data Reviewed: I have personally reviewed following labs and imaging studies  CBC: Recent Labs  Lab 02/10/23 1728 02/11/23 0419 02/12/23 0621 02/13/23 0549  WBC 6.1 7.4 13.5* 13.6*  NEUTROABS 3.9 6.0  --   --   HGB 8.9* 8.7* 7.0* 8.3*  HCT 27.4* 27.0* 21.3* 25.9*  MCV 96.5 97.5 97.7 97.4  PLT 233 223 184 230   Basic Metabolic Panel: Recent Labs  Lab 02/10/23 1728 02/10/23 2327 02/11/23 0419 02/12/23 0621 02/13/23 0549  NA 136  --  137 138 139  K 4.3  --  4.6 4.6 4.2  CL 104  --  106 109 107  CO2 24  --  20* 20* 20*  GLUCOSE 327*  --  287* 265* 202*  BUN 29*  --  26* 31* 35*  CREATININE 2.30* 2.22* 2.15* 2.46* 2.50*  CALCIUM 8.5*  --  8.2* 7.9* 8.5*   GFR: Estimated Creatinine Clearance: 11.4 mL/min (A) (by C-G formula based on SCr of 2.5 mg/dL (H)). Liver Function Tests: No results for input(s): "AST", "ALT", "ALKPHOS", "BILITOT", "PROT", "ALBUMIN" in the last 168 hours. No results for input(s): "LIPASE", "AMYLASE" in the last 168 hours. No results for input(s): "AMMONIA" in the last 168 hours. Coagulation Profile: Recent Labs  Lab 02/10/23 1728  INR 1.0   Cardiac Enzymes: No results for input(s): "CKTOTAL", "CKMB", "CKMBINDEX", "TROPONINI" in the last 168 hours. BNP (last 3 results) No results for input(s): "PROBNP" in the last 8760 hours. HbA1C: No results  for input(s): "HGBA1C" in the last 72 hours. CBG: Recent Labs  Lab 02/13/23 1155 02/13/23 1549 02/13/23 2139 02/14/23 0832 02/14/23 1148  GLUCAP 178* 153* 150* 208* 247*   Lipid Profile: No results for input(s): "CHOL", "HDL", "LDLCALC", "TRIG", "CHOLHDL", "LDLDIRECT" in the last 72 hours. Thyroid Function Tests: No results for input(s): "TSH", "T4TOTAL", "FREET4", "T3FREE", "THYROIDAB" in the last 72 hours. Anemia Panel: No results for input(s): "VITAMINB12", "FOLATE", "FERRITIN", "TIBC", "IRON", "RETICCTPCT" in the last 72 hours. Sepsis Labs: No results for input(s): "PROCALCITON", "LATICACIDVEN" in the last 168 hours.  Recent Results (from the past 240 hours)  Surgical pcr screen     Status: None   Collection Time: 02/11/23 11:40 AM   Specimen: Nasal Mucosa; Nasal Swab  Result Value Ref Range Status   MRSA, PCR NEGATIVE NEGATIVE Final   Staphylococcus aureus NEGATIVE NEGATIVE Final    Comment: (NOTE) The Xpert SA Assay (FDA approved for NASAL specimens in patients 73 years of age and older), is one component of a comprehensive surveillance program. It is not intended to diagnose infection nor to guide or monitor treatment. Performed at [facility], [address], [place], [place] [address]          Radiology Studies: No results found.       Scheduled Meds:  acetaminophen  1,000 mg Oral TID   aspirin EC  325 mg Oral Q breakfast   atorvastatin  80 mg Oral Daily   clopidogrel  75 mg Oral Daily   docusate sodium  100 mg Oral BID   famotidine  10 mg Oral Daily   feeding supplement  237 mL Oral BID BM   heparin  5,000 Units Subcutaneous Q8H   hydrALAZINE  25 mg Oral TID   insulin aspart  0-15 Units Subcutaneous TID WC   insulin aspart  0-5 Units Subcutaneous QHS   metoprolol succinate  50 mg Oral Daily   OLANZapine zydis  5 mg Oral QHS   Continuous Infusions:  cefTRIAXone (ROCEPHIN)  IV 1 g (02/13/23 1759)     LOS: 4 days    Time spent: 45  minutes spent on chart review, discussion with nursing staff, consultants, updating family and interview/physical exam; more than 50% of that time was spent in counseling and/or coordination of care.    Joseph Art, DO [facility] Available via Epic secure chat 7am-7pm After these hours, please refer to coverage provider listed on [email or web address] 02/14/2023, 12:10 PM

## 2023-02-14 NOTE — Significant Event (Signed)
Rapid Response Event Note   Reason for Call :  Unwitnessed fall  Initial Focused Assessment:  Patient is lying by the room door on her left side.  She has an knot on the back left of her head. She has a skin tear on her left elbow and left hip.   She is moving both arms well she is hitting at staff.  She can move both her legs but she is not moving them as much as her arms.   She complains of back pain while lying on the floor.  Once in bed she denies back pain.  She denies neck pain.  She denies hip pain currently.   She is O x 1-2  147/43  HR 74  RR 17  O2 sat 95% on RA   Interventions:  Assisted back to bed with back board and c spine stabilization.   Stat CT head neck and left hip.  She is moving her arms and legs well.  Bedrest   Plan of Care:  Dependent upon CT results. RN to call if Rapid Response RN needed.   Event Summary:   MD Notified: Benjamine Mola Call Time: 1640 Arrival Time: 1640 End Time: 1755  Marcellina Millin, RN

## 2023-02-14 NOTE — Progress Notes (Signed)
At approximately 1530 bed alarm sounded and this RN entered the room where Sandra [name] was found standing at the foot of the bed. RN went to help her and Sandra [name] began swinging her hands and Careers information officer. Sandra [name] began saying "what are you doing in my house". This RN attempted to reorient her that she was in the hospital and called for assistance when Sandra [name] charge RN came in. Sandra [name] only oriented to self. Sandra [name] wanted to use the bathroom. Sandra [name] and this RN took Sandra [name] to bedside commode where she was still combative toward staff and was continuing to hit staff and bended Sandra [name]'s fingers. She continued to hit both staff members. She was placed back in bed and at 1550. PRN klonopin given. Sandra Pert, RN, [place], NT came into room and assisted Sandra [name] back in bed. Pt confused saying "who are you why are you in my house and what did you do to my bed. I didn't tell you to put all this equipment in my house. I did not say it was ok for you to do that". Sandra [name] notified at 1603 and PRN Haldol ordered. Haldol given at 1615. Sandra [name] remained in bed, bed placed in low position, with three side rails up, call bell within reach, bed alarm on, and pads

## 2023-02-14 NOTE — Plan of Care (Signed)
  Problem: Education: Goal: Ability to describe self-care measures that may prevent or decrease complications (Diabetes Survival Skills Education) will improve Outcome: Progressing Goal: Individualized Educational Video(s) Outcome: Progressing   Problem: Coping: Goal: Ability to adjust to condition or change in health will improve Outcome: Progressing   Problem: Fluid Volume: Goal: Ability to maintain a balanced intake and output will improve Outcome: Progressing   Problem: Health Behavior/Discharge Planning: Goal: Ability to identify and utilize available resources and services will improve Outcome: Progressing Goal: Ability to manage health-related needs will improve Outcome: Progressing   Problem: Metabolic: Goal: Ability to maintain appropriate glucose levels will improve Outcome: Progressing   Problem: Nutritional: Goal: Maintenance of adequate nutrition will improve Outcome: Progressing Goal: Progress toward achieving an optimal weight will improve Outcome: Progressing   Problem: Skin Integrity: Goal: Risk for impaired skin integrity will decrease Outcome: Progressing   Problem: Tissue Perfusion: Goal: Adequacy of tissue perfusion will improve Outcome: Progressing   Problem: Education: Goal: Knowledge of General Education information will improve Description: Including pain rating scale, medication(s)/side effects and non-pharmacologic comfort measures Outcome: Progressing   Problem: Health Behavior/Discharge Planning: Goal: Ability to manage health-related needs will improve Outcome: Progressing   Problem: Clinical Measurements: Goal: Ability to maintain clinical measurements within normal limits will improve Outcome: Progressing Goal: Will remain free from infection Outcome: Progressing Goal: Diagnostic test results will improve Outcome: Progressing Goal: Respiratory complications will improve Outcome: Progressing Goal: Cardiovascular complication will  be avoided Outcome: Progressing   Problem: Activity: Goal: Risk for activity intolerance will decrease Outcome: Progressing   Problem: Nutrition: Goal: Adequate nutrition will be maintained Outcome: Progressing   Problem: Coping: Goal: Level of anxiety will decrease Outcome: Progressing   Problem: Elimination: Goal: Will not experience complications related to bowel motility Outcome: Progressing Goal: Will not experience complications related to urinary retention Outcome: Progressing   Problem: Pain Managment: Goal: General experience of comfort will improve and/or be controlled Outcome: Progressing   Problem: Safety: Goal: Ability to remain free from injury will improve Outcome: Progressing   Problem: Skin Integrity: Goal: Risk for impaired skin integrity will decrease Outcome: Progressing   Problem: Education: Goal: Knowledge of the prescribed therapeutic regimen will improve Outcome: Progressing Goal: Understanding of discharge needs will improve Outcome: Progressing Goal: Individualized Educational Video(s) Outcome: Progressing   Problem: Activity: Goal: Ability to avoid complications of mobility impairment will improve Outcome: Progressing Goal: Ability to tolerate increased activity will improve Outcome: Progressing   Problem: Clinical Measurements: Goal: Postoperative complications will be avoided or minimized Outcome: Progressing   Problem: Pain Management: Goal: Pain level will decrease with appropriate interventions Outcome: Progressing   Problem: Skin Integrity: Goal: Will show signs of wound healing Outcome: Progressing

## 2023-02-14 NOTE — Progress Notes (Signed)
Approximately 1630 patient had unwitnessed fall she was found on floor by the door of her left side. She states "this is not my house". Staff attempting to assess Sandra [name] and she is Therapist, occupational with hands. Skin tear noted on left elbow and left thigh area and knot on back of her head on left side. After assessment Dr. Benjamine Mola notified at 1641 and orders placed for stat CT's. Charge RN at bedside to assist, rapid response, Jake NT and myself all assisted pt back into bed using back board and c spine stabilization. [name] verbalized back pain which was relieved once she was back in the bed.This RN and rapid response took pt to STAT CT. Bedrest orders placed by MD    02/14/23 1620  What Happened  Was fall witnessed? No  Was patient injured? Yes  Patient found on floor  Found by Staff-comment (RN)  Stated prior activity ambulating-unassisted  Provider Notification  Provider Name/Title Marlin Canary, DO  Date Provider Notified 02/14/23  Time Provider Notified 1641  Method of Notification Page (secure chat)  Notification Reason Fall  Provider response See new orders  Date of Provider Response 02/14/23  Time of Provider Response 1641  Follow Up  Family notified Yes - comment  Time family notified 1650  Additional tests Yes-comment (CT head, CT cervial spine, CT head)  Simple treatment Dressing (ice and dressing)  Progress note created (see row info) Yes  Blank note created Yes  Adult Fall Risk Assessment  Risk Factor Category (scoring not indicated) History of more than one fall within 6 months before admission (document High fall risk)  Age 89  Fall History: Fall within 6 months prior to admission 5  Elimination; Bowel and/or Urine Incontinence 2  Elimination; Bowel and/or Urine Urgency/Frequency 0  Medications: includes PCA/Opiates, Anti-convulsants, Anti-hypertensives, Diuretics, Hypnotics, Laxatives, Sedatives, and Psychotropics 5  Patient Care Equipment 1   Mobility-Assistance 2  Mobility-Gait 2  Mobility-Sensory Deficit 0  Altered awareness of immediate physical environment 1  Impulsiveness 2  Lack of understanding of one's physical/cognitive limitations 4  Total Score 27  Patient Fall Risk Level High fall risk  Adult Fall Risk Interventions  Required Bundle Interventions *See Row Information* High fall risk - low, moderate, and high requirements implemented  Additional Interventions Use of appropriate toileting equipment (bedpan, BSC, etc.);HeadStart bed sensor (education/return demonstration)  Fall intervention(s) refused/Patient educated regarding refusal Bed alarm  Screening for Fall Injury Risk (To be completed on HIGH fall risk patients) - Assessing Need for Floor Mats  Risk For Fall Injury- Criteria for Floor Mats 85 years or older  Will Implement Floor Mats Yes  Vitals  Temp (!) 97.5 F (36.4 C)  Temp Source Oral  BP (!) 128/59  MAP (mmHg) 81  BP Location Right Arm  BP Method Automatic  Patient Position (if appropriate) Lying  Pulse Rate 89  Resp 18  Oxygen Therapy  SpO2 99 %  O2 Device Room Air  Pain Assessment  Pain Scale Faces  Faces Pain Scale 8  Pain Type Acute pain  Pain Location Head  Pain Orientation Left  Pain Descriptors / Indicators Discomfort  Pain Intervention(s) Other (Comment);Hot/Cold interventions (ice pack)  Hot/Cold Interventions  Hot/Cold Interventions Ice Pack  PAINAD (Pain Assessment in Advanced Dementia)  Breathing 0  Negative Vocalization 0  Facial Expression 2  Body Language 2  Consolability 2  PAINAD Score 6  PCA/Epidural/Spinal Assessment  Respiratory Pattern Regular;Unlabored  Neurological  Neuro (WDL) X  Level of  Consciousness Alert  Orientation Level Disoriented X4  Cognition Impulsive;Poor attention/concentration;Poor judgement;Poor safety awareness;Unable to follow commands  Speech Clear  R Pupil Size (mm) 4  R Pupil Shape Round  R Pupil Reaction Brisk  L Pupil Size  (mm) 4  L Pupil Shape Round  L Pupil Reaction Brisk  Motor Function/Sensation Assessment Head;Motor response  Facial Symmetry Symmetrical  R Hand Grip Strong  L Hand Grip Strong  R Elbow Extension (Push/Biceps) Strong  L Elbow Extension (Push/Biceps) Strong  R Foot Dorsiflexion Strong  L Foot Dorsiflexion Strong  RUE Motor Response Purposeful movement  LUE Motor Response Purposeful movement  RLE Motor Response Purposeful movement  LLE Motor Response Purposeful movement  Neuro Symptoms Agitation;Combative  Glasgow Coma Scale  Eye Opening 4  Best Verbal Response (NON-intubated) 4  Best Motor Response 6  Glasgow Coma Scale Score 14  Musculoskeletal  Musculoskeletal (WDL) X  Assistive Device None (MD ordered bedrest)  Generalized Weakness Yes  Weight Bearing Restrictions Per Provider Order Yes  LLE Weight Bearing Per Provider Order  (bedrest)  Musculoskeletal Details  Left Hip Injury/trauma;Surgery  Integumentary  Integumentary (WDL) X  Nurse Assisting with Skin Assessment on Admission Angelito, RN  Skin Condition Dry  Skin Integrity Abrasion;Other (Comment) (swollen on left side on back of head)  Abrasion Location Elbow;Thigh  Abrasion Location Orientation Left (left elblow, left thigh)  Abrasion Intervention Cleansed;Non-adherent

## 2023-02-14 NOTE — Progress Notes (Signed)
Patient ID: Sandra [name], female   DOB: 1934/01/17, 89 y.o.   MRN: [id number] I have reviewed the patient's plain films and the CT scan of her left hip.  This is actually a stable fracture pattern.  I also actually disagree with the radiologist's read of the fracture.  It is not intertrochanteric fracture.  It is a small fracture line at the greater trochanter.  The actual implant is stable and I see no worrisome features of the fracture pattern.  This will not involve any type of surgical intervention.  I will modify the orders for physical therapy to not allow hip abduction but I will still allow weightbearing as tolerated based on what orthopedics is seeing on the imaging studies.

## 2023-02-15 DIAGNOSIS — S72012A Unspecified intracapsular fracture of left femur, initial encounter for closed fracture: Secondary | ICD-10-CM | POA: Diagnosis not present

## 2023-02-15 LAB — GLUCOSE, CAPILLARY
Glucose-Capillary: 151 mg/dL — ABNORMAL HIGH (ref 70–99)
Glucose-Capillary: 269 mg/dL — ABNORMAL HIGH (ref 70–99)
Glucose-Capillary: 135 mg/dL — ABNORMAL HIGH (ref 70–99)
Glucose-Capillary: 140 mg/dL — ABNORMAL HIGH (ref 70–99)

## 2023-02-15 LAB — BASIC METABOLIC PANEL
Anion gap: 13 (ref 5–15)
BUN: 40 mg/dL — ABNORMAL HIGH (ref 8–23)
CO2: 19 mmol/L — ABNORMAL LOW (ref 22–32)
Calcium: 8.4 mg/dL — ABNORMAL LOW (ref 8.9–10.3)
Chloride: 107 mmol/L (ref 98–111)
Creatinine, Ser: 2.61 mg/dL — ABNORMAL HIGH (ref 0.44–1.00)
GFR, Estimated: 17 mL/min — ABNORMAL LOW (ref >60)
Glucose, Bld: 164 mg/dL — ABNORMAL HIGH (ref 70–99)
Potassium: 3.6 mmol/L (ref 3.5–5.1)
Sodium: 139 mmol/L (ref 135–145)

## 2023-02-15 LAB — URINE CULTURE: Culture: >=100000 — AB

## 2023-02-15 LAB — CBC
HCT: 23.8 % — ABNORMAL LOW (ref 36.0–46.0)
Hemoglobin: 7.6 g/dL — ABNORMAL LOW (ref 12.0–15.0)
MCH: 30.6 pg (ref 26.0–34.0)
MCHC: 31.9 g/dL (ref 30.0–36.0)
MCV: 96 fL (ref 80.0–100.0)
Platelets: 255 10*3/uL (ref 150–400)
RBC: 2.48 MIL/uL — ABNORMAL LOW (ref 3.87–5.11)
RDW: 13.2 % (ref 11.5–15.5)
WBC: 10.6 10*3/uL — ABNORMAL HIGH (ref 4.0–10.5)
nRBC: 0 % (ref 0.0–0.2)

## 2023-02-15 MED ORDER — INSULIN ASPART 100 UNIT/ML IJ SOLN
3.0000 [IU] | Freq: Three times a day (TID) | INTRAMUSCULAR | Status: DC
  Administered 2023-02-15 – 2023-02-18 (×8): 3 [IU] via SUBCUTANEOUS

## 2023-02-15 MED ORDER — ENOXAPARIN SODIUM 40 MG/0.4ML IJ SOSY
40.0000 mg | PREFILLED_SYRINGE | INTRAMUSCULAR | Status: DC
  Administered 2023-02-16: 40 mg via SUBCUTANEOUS
  Filled 2023-02-15: qty 0.4

## 2023-02-15 MED ORDER — RISAQUAD PO CAPS
2.0000 | ORAL_CAPSULE | Freq: Every day | ORAL | Status: DC
  Administered 2023-02-15 – 2023-02-18 (×4): 2 via ORAL
  Filled 2023-02-15 (×4): qty 2

## 2023-02-15 NOTE — Consult Note (Signed)
[facility] Liaison Consult Note    02/15/2023  Sandra [name] [birth year] [id number]  Insurance: [employer]    Primary Care Provider: [name], NP, with [facility], this provider is listed for the transition of care follow up appointments  and [facility] calls   [facility] Liaison screened the patient remotely at [facility]. Reviewed electronic medical record to assess for needs.  Per staff and notes patient is confused and paranoid.   The patient was screened for 4- day hospitalization with noted extreme high risk score for unplanned readmission risk 2 hospital admissions in 6 months.  The patient was assessed for potential Community Care Coordination service needs for post hospital transition for care coordination. Review of patient's electronic medical record reveals patient is recommended for a skilled nursing facility level of care.   Plan: [facility] Liaison will continue to follow progress and disposition to asess for post hospital community care coordination/management needs.  Referral request for community care coordination: following,   [facility] Community Care, Population Health does not replace or interfere with any arrangements made by the Inpatient Transition of Care team.   For questions contact:   Charlesetta Shanks, RN, BSN, CCM [place]  [facility], [facility] Liaison Direct Dial: [phone] or secure chat Email: [email or web address]

## 2023-02-15 NOTE — Progress Notes (Addendum)
Physical Therapy Treatment Patient Details Name: Sandra [name] MRN: [id number] DOB: 01-23-1933 Today's Date: 02/15/2023   History of Present Illness 89 y.o. female presents to [facility] 02/10/23 after falling w/ L subcapital neck fx. S/p L anterior direct hip hemiarthroplasty 1/23. Patient had another unwitnessed fall in the hospital, resulting in a small fx of the greater trochanter.  Multiple prior hospital visits since 11/24 for diarrhea and acute on CKD. PMH includes: DM2, diabetic retinopathy, essential HTN, CKD, TIA, depression    PT Comments  Patient was eager and willing to participate with therapy today, with the support of her daughter in the room. The patient did a great job transitioning herself to the EOB with supervision, requiring minimal verbal cues to ease the movement of bilateral LE's to EOB. The patient required CGA +2 to ensure safety when transitioning from a sit > stand, tolerating an even weight distribution amongst both LE's. The patient did a great job using UE support to push herself from a seated position to an upright posture. The patient was able to demonstrate a stand pivot swiftly and safely due to having a bowel movement while standing. Overall, the patient will continue to benefit from acute care PT in order to maximize a functional level of independence.    If plan is discharge home, recommend the following: A lot of help with bathing/dressing/bathroom;Assistance with cooking/housework;Assist for transportation;Help with stairs or ramp for entrance;A little help with walking and/or transfers   Can travel by private vehicle     No  Equipment Recommendations  Other (comment) (defer)    Recommendations for Other Services OT consult     Precautions / Restrictions Precautions Precautions: Fall;Anterior Hip Precaution Booklet Issued: No Precaution Comments: No L Hip ABD or L Hip Strengthening Restrictions Weight Bearing Restrictions Per Provider Order: Yes LLE  Weight Bearing Per Provider Order: Weight bearing as tolerated     Mobility  Bed Mobility Overal bed mobility: Needs Assistance Bed Mobility: Supine to Sit (Supervision)            General bed mobility comments: Pt was able to transition to EOB w/o assistance. Verbal cues given for leg movement    Transfers Overall transfer level: Needs assistance Equipment used: Rolling walker (2 wheels) Transfers: Sit to/from Stand, Bed to chair/wheelchair/BSC Sit to Stand: Contact guard assist Stand pivot transfers: Contact guard assist         General transfer comment: CGA +2 to ensure safety. Pt was able to use bilateral LE support and distribute weight evenly for bilateral LE.    Ambulation/Gait Ambulation/Gait assistance: Contact guard assist Gait Distance (Feet): 5 Feet Assistive device: Rolling walker (2 wheels) Gait Pattern/deviations: Step-to pattern Gait velocity: Decreased     General Gait Details: Did not ambulate far due to patient soiling bed and having to use [facility]   Stairs             Wheelchair Mobility     Tilt Bed    Modified Rankin (Stroke Patients Only)       Balance Overall balance assessment: Needs assistance Sitting-balance support: Feet supported, Bilateral upper extremity supported Sitting balance-Leahy Scale: Good     Standing balance support: Bilateral upper extremity supported Standing balance-Leahy Scale: Fair Standing balance comment: Patient was able to weighshift on both sides, reliant on RW for support due to acute injury - CGA +2 for safety  Cognition Arousal: Alert Behavior During Therapy: WFL for tasks assessed/performed Overall Cognitive Status: Within Functional Limits for tasks assessed                                 General Comments: Pleasant and jokes appropiately        Exercises      General Comments        Pertinent Vitals/Pain Pain Assessment Pain  Assessment: Faces Faces Pain Scale: Hurts a little bit Pain Location: L hip area Pain Descriptors / Indicators: Discomfort, Grimacing, Guarding Pain Intervention(s): Monitored during session    Home Living                          Prior Function            PT Goals (current goals can now be found in the care plan section) Acute Rehab PT Goals PT Goal Formulation: With patient Time For Goal Achievement: 02/26/23 Potential to Achieve Goals: Good Progress towards PT goals: Progressing toward goals    Frequency    Min 1X/week      PT Plan      Co-evaluation              AM-PAC PT "6 Clicks" Mobility   Outcome Measure  Help needed turning from your back to your side while in a flat bed without using bedrails?: A Little Help needed moving from lying on your back to sitting on the side of a flat bed without using bedrails?: A Little Help needed moving to and from a bed to a chair (including a wheelchair)?: A Little Help needed standing up from a chair using your arms (e.g., wheelchair or bedside chair)?: A Little Help needed to walk in hospital room?: A Little Help needed climbing 3-5 steps with a railing? : A Lot 6 Click Score: 17    End of Session Equipment Utilized During Treatment: Gait belt Activity Tolerance: Patient tolerated treatment well Patient left: in chair;with call bell/phone within reach;with chair alarm set;with family/visitor present Nurse Communication: Mobility status PT Visit Diagnosis: Unsteadiness on feet (R26.81);History of falling (Z91.81);Muscle weakness (generalized) (M62.81)     Time: 3086-5784 PT Time Calculation (min) (ACUTE ONLY): 28 min  Charges:    $Therapeutic Activity: 23-37 mins PT General Charges $$ ACUTE PT VISIT: 1 Visit                     Sandra [name], SPT   [name] 02/15/2023, 4:45 PM

## 2023-02-15 NOTE — TOC Progression Note (Signed)
Transition of Care [facility]) - Progression Note    Patient Details  Name: Sandra [name] MRN: [id number] Date of Birth: 08-07-1933  [facility] CM/SW Contact  Lorri Frederick, LCSW Phone Number: 02/15/2023, 4:21 PM  Clinical Narrative:   CSW spoke with pt daughter [name].  She is on her way to the hospital, but directed CSW to nephew Lovett Sox, [phone].  CSW spoke with him regarding bed offers, he would like to see options on medicare choice document.  Brought this to room and pt son Onalee Hua was there, discussed the bed offers.  He will discuss with family.      Expected Discharge Plan: Skilled Nursing Facility Barriers to Discharge: Continued Medical Work up, SNF Pending bed offer, English as a second language teacher  Expected Discharge Plan and Services In-house Referral: Clinical Social Work   Post Acute Care Choice: Skilled Nursing Facility Living arrangements for the past 2 months: Single Family Home                                       Social Determinants of Health (SDOH) Interventions SDOH Screenings   Food Insecurity: No Food Insecurity (02/11/2023)  Recent Concern: Food Insecurity - Food Insecurity Present (12/19/2022)  Housing: Low Risk  (02/11/2023)  Transportation Needs: No Transportation Needs (02/11/2023)  Utilities: Not At Risk (02/11/2023)  Depression (PHQ2-9): Low Risk  (01/19/2023)  Social Connections: Socially Isolated (02/11/2023)  Tobacco Use: Medium Risk (02/11/2023)    Readmission Risk Interventions    12/21/2022   12:26 PM  Readmission Risk Prevention Plan  Transportation Screening Complete  PCP or Specialist Appt within 3-5 Days Complete  HRI or Home Care Consult Complete  Social Work Consult for Recovery Care Planning/Counseling Complete  Palliative Care Screening Not Applicable  Medication Review Oceanographer) Complete

## 2023-02-15 NOTE — Progress Notes (Signed)
Patient ID: Sandra [name], female   DOB: 16-Apr-1933, 89 y.o.   MRN: [id number] The patient is awake and alert and her daughter is at the bedside.  She did have a mechanical fall in her room yesterday and I was able to review the plain films and CT scan involving her left hip.  She has a left hip hemiarthroplasty that we did this past Thursday after she had sustained a femoral neck fracture.  The CT scan shows just a small fracture in the greater trochanteric area of the hip but this is small and minimal.  From an orthopedic standpoint, she can still be up with assistance and full weightbearing on that left hip but only needs to be up with assistance.  Therapy should work on balance and mobility and no hip abduction and no hip strengthening for the short-term.  Her left hip incision has no dressing because she has pulled it off.  Nursing at the bedside said that her cognitive function is improving in terms of being less confused.  They will put another Aquacel on the left hip dressing at some point today.  I would limit pain medication to just Tylenol for now.

## 2023-02-15 NOTE — Progress Notes (Signed)
PT Cancellation Note  Patient Details Name: Sandra [name] MRN: [id number] DOB: 04-24-1933   Cancelled Treatment:    Reason Eval/Treat Not Completed: (P) Active bedrest order Per MD note, some new hip motion restrictions, messaged supervising PT as she may need PT re-eval once medically appropriate.   Dorathy Kinsman [name] 02/15/2023, 10:57 AM

## 2023-02-15 NOTE — Progress Notes (Signed)
OT Cancellation Note  Patient Details Name: Sandra [name] MRN: [id number] DOB: [birth year]   Cancelled Treatment:    Reason Eval/Treat Not Completed: Active bedrest order Active bedrest order Per MD note, some new hip motion restrictions   Mateo Flow 02/15/2023, 11:04 AM

## 2023-02-15 NOTE — Progress Notes (Signed)
PROGRESS NOTE    Sandra [name]  GNF:[id number] DOB: 26-Feb-1933 DOA: 02/10/2023 PCP: [name], NP    Brief Narrative:  89 year old female with past medical history of HTN, T2DM, HLD, anxiety, history of CVA.  Patient has been in and out of the ER/hospital since 11/24.  On 11/24 she was in the ER, with diarrhea diagnosed with colitis.  Discharged with Cipro and Flagyl.  Patient had ongoing nausea vomiting despite antibiotics.  Admitted 11/30 - 12/10 with acute on chronic kidney disease..  Creatinine 5, baseline 2.7..  Patient treated with IV fluids and antibiotics, discharged home.  Patient continues to be weak.  Her diarrhea has resolved.  Tonight patient got up to go to the restroom.  Her daughter heard her screaming after she fell. Found to have hip fracture and plan for OR on 1/23.   Assessment and Plan: Subcapital fracture of hip, left, closed, initial encounter [facility]) -OR 1/23 -ortho consulted -pt/ot eval- SNF -schedule tylenol  Unwitnessed fall -scans of head/neck and hip done -re-eval from ortho: CT scan shows just a small fracture in the greater trochanteric area of the hip but this is small and minimal. From an orthopedic standpoint, she can still be up with assistance and full weightbearing on that left hip but only needs to be up with assistance. Therapy should work on balance and mobility and no hip abduction and no hip strengthening for the short-term. Her left hip incision has no dressing because she has pulled it off. Nursing at the bedside said that her cognitive function is improving in terms of being less confused. They will put another Aquacel on the left hip dressing at some point today. I would limit pain medication to just Tylenol for now.    UTI -culture pending -IV abx x 5 days  Anxiety state -Celexa resumed -Klonopin as needed resumed   History of CVA -resume plavix   Benign essential HTN -Hydralazine resumed. -Metoprolol held with patient's  borderline BP   Diabetes mellitus type 2 with retinopathy (HCC) -Sliding scale insulin initiated   dyslipidemia -Atorvastatin resumed   Delirium precautions -sitter at bedside or tele sitter for now- will need to stop prior to d/c to SNF-- mental status slightly better today but still confused/paranoid  CKD stage IV -stable  DVT prophylaxis: enoxaparin (LOVENOX) injection 40 mg Start: 02/16/23 1600 Place and maintain sequential compression device Start: 02/14/23 1751 SCDs Start: 02/11/23 1530    Code Status: Limited: Do not attempt resuscitation (DNR) -DNR-LIMITED -Do Not Intubate/DNI  Updated MPOA: Valentina Shaggy Kirkman---[phone]  Disposition Plan:  Level of care: Med-Surg Status is: Inpatient     Consultants:  ortho   Subjective: Says [name] has re-arranged her furniture at home  Objective: Vitals:   02/14/23 1738 02/14/23 2010 02/15/23 0358 02/15/23 0747  BP: (!) 147/43 (!) 125/44 (!) 135/42 (!) 155/50  Pulse: 74 71 66 68  Resp: 17 18 17    Temp: (!) 97.5 F (36.4 C)  98.2 F (36.8 C) 98.2 F (36.8 C)  TempSrc: Oral   Oral  SpO2: 95% 100% 99% 98%  Weight:      Height:        Intake/Output Summary (Last 24 hours) at 02/15/2023 1249 Last data filed at 02/15/2023 0100 Gross per 24 hour  Intake 120 ml  Output 1 ml  Net 119 ml   Filed Weights   02/11/23 1051  Weight: 51 kg    Examination:    General: Appearance:    Well developed, well  nourished female in no acute distress     Lungs:      respirations unlabored  Heart:    Normal heart rate. Normal rhythm. No murmurs, rubs, or gallops.   MS:   All extremities are intact.   Neurologic:   Awake, alert       Data Reviewed: I have personally reviewed following labs and imaging studies  CBC: Recent Labs  Lab 02/10/23 1728 02/11/23 0419 02/12/23 0621 02/13/23 0549 02/15/23 0509  WBC 6.1 7.4 13.5* 13.6* 10.6*  NEUTROABS 3.9 6.0  --   --   --   HGB 8.9* 8.7* 7.0* 8.3* 7.6*  HCT 27.4* 27.0*  21.3* 25.9* 23.8*  MCV 96.5 97.5 97.7 97.4 96.0  PLT 233 223 184 230 255   Basic Metabolic Panel: Recent Labs  Lab 02/10/23 1728 02/10/23 2327 02/11/23 0419 02/12/23 0621 02/13/23 0549 02/15/23 0509  NA 136  --  137 138 139 139  K 4.3  --  4.6 4.6 4.2 3.6  CL 104  --  106 109 107 107  CO2 24  --  20* 20* 20* 19*  GLUCOSE 327*  --  287* 265* 202* 164*  BUN 29*  --  26* 31* 35* 40*  CREATININE 2.30* 2.22* 2.15* 2.46* 2.50* 2.61*  CALCIUM 8.5*  --  8.2* 7.9* 8.5* 8.4*   GFR: Estimated Creatinine Clearance: 10.9 mL/min (A) (by C-G formula based on SCr of 2.61 mg/dL (H)). Liver Function Tests: No results for input(s): "AST", "ALT", "ALKPHOS", "BILITOT", "PROT", "ALBUMIN" in the last 168 hours. No results for input(s): "LIPASE", "AMYLASE" in the last 168 hours. No results for input(s): "AMMONIA" in the last 168 hours. Coagulation Profile: Recent Labs  Lab 02/10/23 1728  INR 1.0   Cardiac Enzymes: No results for input(s): "CKTOTAL", "CKMB", "CKMBINDEX", "TROPONINI" in the last 168 hours. BNP (last 3 results) No results for input(s): "PROBNP" in the last 8760 hours. HbA1C: No results for input(s): "HGBA1C" in the last 72 hours. CBG: Recent Labs  Lab 02/14/23 1148 02/14/23 1736 02/14/23 2142 02/15/23 0754 02/15/23 1109  GLUCAP 247* 250* 229* 151* 269*   Lipid Profile: No results for input(s): "CHOL", "HDL", "LDLCALC", "TRIG", "CHOLHDL", "LDLDIRECT" in the last 72 hours. Thyroid Function Tests: No results for input(s): "TSH", "T4TOTAL", "FREET4", "T3FREE", "THYROIDAB" in the last 72 hours. Anemia Panel: No results for input(s): "VITAMINB12", "FOLATE", "FERRITIN", "TIBC", "IRON", "RETICCTPCT" in the last 72 hours. Sepsis Labs: No results for input(s): "PROCALCITON", "LATICACIDVEN" in the last 168 hours.  Recent Results (from the past 240 hours)  Surgical pcr screen     Status: None   Collection Time: 02/11/23 11:40 AM   Specimen: Nasal Mucosa; Nasal Swab  Result  Value Ref Range Status   MRSA, PCR NEGATIVE NEGATIVE Final   Staphylococcus aureus NEGATIVE NEGATIVE Final    Comment: (NOTE) The Xpert SA Assay (FDA approved for NASAL specimens in patients 63 years of age and older), is one component of a comprehensive surveillance program. It is not intended to diagnose infection nor to guide or monitor treatment. Performed at [facility], [address], [place], [place] [address]   Urine Culture     Status: None (Preliminary result)   Collection Time: 02/13/23  5:30 PM   Specimen: Urine, Clean Catch  Result Value Ref Range Status   Specimen Description URINE, CLEAN CATCH  Final   Special Requests NONE  Final   Culture   Final    CULTURE REINCUBATED FOR BETTER GROWTH  Performed at [facility], [address], [place], [place] [address]    Report Status PENDING  Incomplete         Radiology Studies: CT HEAD WO CONTRAST ( ) Result Date: 02/14/2023 CLINICAL DATA:  Neck trauma (Age >= 65y); Head trauma, focal neuro findings (Age 57-64y) EXAM: CT HEAD WITHOUT CONTRAST CT CERVICAL SPINE WITHOUT CONTRAST TECHNIQUE: Multidetector CT imaging of the head and cervical spine was performed following the standard protocol without intravenous contrast. Multiplanar CT image reconstructions of the cervical spine were also generated. RADIATION DOSE REDUCTION: This exam was performed according to the departmental dose-optimization program which includes automated exposure control, adjustment of the mA and/or kV according to patient size and/or use of iterative reconstruction technique. COMPARISON:  CT head and C-spine 02/10/2023 FINDINGS: CT HEAD FINDINGS Brain: Cerebral ventricle sizes are concordant with the degree of cerebral volume loss. Patchy and confluent areas of decreased attenuation are noted throughout the deep and periventricular white matter of the cerebral hemispheres bilaterally, compatible with chronic microvascular ischemic disease.  Similar-appearing right parieto-occipital encephalomalacia. No evidence of large-territorial acute infarction. No parenchymal hemorrhage. No mass lesion. No extra-axial collection. No mass effect or midline shift. No hydrocephalus. Basilar cisterns are patent. Vascular: No hyperdense vessel. Atherosclerotic calcifications are present within the cavernous internal carotid arteries. Skull: No acute fracture or focal lesion. Sinuses/Orbits: Paranasal sinuses and mastoid air cells are clear. The orbits are unremarkable. Other: Left 11 mm parieto-occipital scalp hematoma formation. CT CERVICAL SPINE FINDINGS Alignment: Stable grade 1 anterolisthesis of C7 on T1. Skull base and vertebrae: C6-C7 anterior cervical discectomy and fusion surgical hardware. Multilevel moderate degenerative changes of the spine. Moderate severe osseous neural foraminal stenosis bilaterally at the C6-C7 level, left greater than right. No acute fracture. No aggressive appearing focal osseous lesion or focal pathologic process. Soft tissues and spinal canal: No prevertebral fluid or swelling. No visible canal hematoma. Upper chest: Unremarkable. Other: Atherosclerotic plaque of the aortic arch and its branches. IMPRESSION: 1. No acute intracranial abnormality. 2. No acute displaced fracture or traumatic listhesis of the cervical spine. 3.  Aortic Atherosclerosis (ICD10-I70.0). Electronically Signed   By: Tish Frederickson M.D.   On: 02/14/2023 18:06   CT CERVICAL SPINE WO CONTRAST Result Date: 02/14/2023 CLINICAL DATA:  Neck trauma (Age >= 65y); Head trauma, focal neuro findings (Age 53-64y) EXAM: CT HEAD WITHOUT CONTRAST CT CERVICAL SPINE WITHOUT CONTRAST TECHNIQUE: Multidetector CT imaging of the head and cervical spine was performed following the standard protocol without intravenous contrast. Multiplanar CT image reconstructions of the cervical spine were also generated. RADIATION DOSE REDUCTION: This exam was performed according to the  departmental dose-optimization program which includes automated exposure control, adjustment of the mA and/or kV according to patient size and/or use of iterative reconstruction technique. COMPARISON:  CT head and C-spine 02/10/2023 FINDINGS: CT HEAD FINDINGS Brain: Cerebral ventricle sizes are concordant with the degree of cerebral volume loss. Patchy and confluent areas of decreased attenuation are noted throughout the deep and periventricular white matter of the cerebral hemispheres bilaterally, compatible with chronic microvascular ischemic disease. Similar-appearing right parieto-occipital encephalomalacia. No evidence of large-territorial acute infarction. No parenchymal hemorrhage. No mass lesion. No extra-axial collection. No mass effect or midline shift. No hydrocephalus. Basilar cisterns are patent. Vascular: No hyperdense vessel. Atherosclerotic calcifications are present within the cavernous internal carotid arteries. Skull: No acute fracture or focal lesion. Sinuses/Orbits: Paranasal sinuses and mastoid air cells are clear. The orbits are unremarkable. Other: Left 11 mm parieto-occipital scalp hematoma  formation. CT CERVICAL SPINE FINDINGS Alignment: Stable grade 1 anterolisthesis of C7 on T1. Skull base and vertebrae: C6-C7 anterior cervical discectomy and fusion surgical hardware. Multilevel moderate degenerative changes of the spine. Moderate severe osseous neural foraminal stenosis bilaterally at the C6-C7 level, left greater than right. No acute fracture. No aggressive appearing focal osseous lesion or focal pathologic process. Soft tissues and spinal canal: No prevertebral fluid or swelling. No visible canal hematoma. Upper chest: Unremarkable. Other: Atherosclerotic plaque of the aortic arch and its branches. IMPRESSION: 1. No acute intracranial abnormality. 2. No acute displaced fracture or traumatic listhesis of the cervical spine. 3.  Aortic Atherosclerosis (ICD10-I70.0). Electronically Signed    By: Tish Frederickson M.D.   On: 02/14/2023 18:06   CT HIP LEFT WO CONTRAST Result Date: 02/14/2023 CLINICAL DATA:  Fall with injury after recent repair EXAM: CT OF THE LEFT HIP WITHOUT CONTRAST TECHNIQUE: Multidetector CT imaging of the left hip was performed according to the standard protocol. Multiplanar CT image reconstructions were also generated. RADIATION DOSE REDUCTION: This exam was performed according to the departmental dose-optimization program which includes automated exposure control, adjustment of the mA and/or kV according to patient size and/or use of iterative reconstruction technique. COMPARISON:  X-ray intraoperative left hip 02/11/2023, x-ray pelvis 02/10/2023 FINDINGS: Bones/Joint/Cartilage Total left hip arthroplasty with periprosthetic minimally displaced acute intertrochanteric fracture. Ligaments Suboptimally assessed by CT. Muscles and Tendons Subcutaneus soft tissue edema and emphysema along the left hip and proximal thigh soft tissues and musculature likely postsurgical in etiology. Soft tissues Subcutaneus soft tissue edema and emphysema along the left hip and proximal thigh soft tissues and musculature likely postsurgical in etiology. Left gluteal, hip, proximal thigh hematoma formation along the posterolateral aspect. Overlying skin staples. Other: Atherosclerotic plaque. IMPRESSION: 1. Total left hip arthroplasty with periprosthetic minimally displaced acute intertrochanteric fracture. 2. Subcutaneus soft tissue edema and emphysema along the left hip and proximal thigh soft tissues and musculature likely postsurgical in etiology. Differential diagnosis includes infection. Electronically Signed   By: Tish Frederickson M.D.   On: 02/14/2023 17:53         Scheduled Meds:  acetaminophen  1,000 mg Oral TID   aspirin EC  325 mg Oral Q breakfast   atorvastatin  80 mg Oral Daily   clopidogrel  75 mg Oral Daily   docusate sodium  100 mg Oral BID   [START ON 02/16/2023]  enoxaparin (LOVENOX) injection  40 mg Subcutaneous Q24H   famotidine  10 mg Oral Daily   feeding supplement  237 mL Oral BID BM   hydrALAZINE  25 mg Oral TID   insulin aspart  0-15 Units Subcutaneous TID WC   insulin aspart  0-5 Units Subcutaneous QHS   insulin aspart  3 Units Subcutaneous TID WC   metoprolol succinate  50 mg Oral Daily   polyethylene glycol  17 g Oral Daily   Continuous Infusions:  cefTRIAXone (ROCEPHIN)  IV 1 g (02/13/23 1759)     LOS: 5 days    Time spent: 45 minutes spent on chart review, discussion with nursing staff, consultants, updating family and interview/physical exam; more than 50% of that time was spent in counseling and/or coordination of care.    Joseph Art, DO [facility] Available via Epic secure chat 7am-7pm After these hours, please refer to coverage provider listed on [email or web address] 02/15/2023, 12:49 PM

## 2023-02-16 DIAGNOSIS — S72012A Unspecified intracapsular fracture of left femur, initial encounter for closed fracture: Secondary | ICD-10-CM | POA: Diagnosis not present

## 2023-02-16 LAB — CBC
HCT: 25.6 % — ABNORMAL LOW (ref 36.0–46.0)
Hemoglobin: 8.3 g/dL — ABNORMAL LOW (ref 12.0–15.0)
MCH: 31.2 pg (ref 26.0–34.0)
MCHC: 32.4 g/dL (ref 30.0–36.0)
MCV: 96.2 fL (ref 80.0–100.0)
Platelets: 254 10*3/uL (ref 150–400)
RBC: 2.66 MIL/uL — ABNORMAL LOW (ref 3.87–5.11)
RDW: 13.3 % (ref 11.5–15.5)
WBC: 9 10*3/uL (ref 4.0–10.5)
nRBC: 0 % (ref 0.0–0.2)

## 2023-02-16 LAB — BASIC METABOLIC PANEL
Anion gap: 12 (ref 5–15)
BUN: 41 mg/dL — ABNORMAL HIGH (ref 8–23)
CO2: 18 mmol/L — ABNORMAL LOW (ref 22–32)
Calcium: 8.2 mg/dL — ABNORMAL LOW (ref 8.9–10.3)
Chloride: 108 mmol/L (ref 98–111)
Creatinine, Ser: 2.47 mg/dL — ABNORMAL HIGH (ref 0.44–1.00)
GFR, Estimated: 18 mL/min — ABNORMAL LOW (ref >60)
Glucose, Bld: 195 mg/dL — ABNORMAL HIGH (ref 70–99)
Potassium: 3.6 mmol/L (ref 3.5–5.1)
Sodium: 138 mmol/L (ref 135–145)

## 2023-02-16 LAB — GLUCOSE, CAPILLARY
Glucose-Capillary: 192 mg/dL — ABNORMAL HIGH (ref 70–99)
Glucose-Capillary: 96 mg/dL (ref 70–99)
Glucose-Capillary: 210 mg/dL — ABNORMAL HIGH (ref 70–99)
Glucose-Capillary: 107 mg/dL — ABNORMAL HIGH (ref 70–99)

## 2023-02-16 MED ORDER — ENOXAPARIN SODIUM 30 MG/0.3ML IJ SOSY
30.0000 mg | PREFILLED_SYRINGE | INTRAMUSCULAR | Status: DC
  Administered 2023-02-17: 30 mg via SUBCUTANEOUS
  Filled 2023-02-16: qty 0.3

## 2023-02-16 NOTE — Plan of Care (Signed)
  Problem: Education: Goal: Ability to describe self-care measures that may prevent or decrease complications (Diabetes Survival Skills Education) will improve Outcome: Progressing Goal: Individualized Educational Video(s) Outcome: Progressing   Problem: Coping: Goal: Ability to adjust to condition or change in health will improve Outcome: Progressing   Problem: Fluid Volume: Goal: Ability to maintain a balanced intake and output will improve Outcome: Progressing   Problem: Health Behavior/Discharge Planning: Goal: Ability to identify and utilize available resources and services will improve Outcome: Progressing Goal: Ability to manage health-related needs will improve Outcome: Progressing   Problem: Metabolic: Goal: Ability to maintain appropriate glucose levels will improve Outcome: Progressing   Problem: Nutritional: Goal: Maintenance of adequate nutrition will improve Outcome: Progressing Goal: Progress toward achieving an optimal weight will improve Outcome: Progressing   Problem: Skin Integrity: Goal: Risk for impaired skin integrity will decrease Outcome: Progressing   Problem: Tissue Perfusion: Goal: Adequacy of tissue perfusion will improve Outcome: Progressing   Problem: Education: Goal: Knowledge of General Education information will improve Description: Including pain rating scale, medication(s)/side effects and non-pharmacologic comfort measures Outcome: Progressing   Problem: Health Behavior/Discharge Planning: Goal: Ability to manage health-related needs will improve Outcome: Progressing   Problem: Clinical Measurements: Goal: Ability to maintain clinical measurements within normal limits will improve Outcome: Progressing Goal: Will remain free from infection Outcome: Progressing Goal: Diagnostic test results will improve Outcome: Progressing Goal: Respiratory complications will improve Outcome: Progressing Goal: Cardiovascular complication will  be avoided Outcome: Progressing   Problem: Activity: Goal: Risk for activity intolerance will decrease Outcome: Progressing   Problem: Nutrition: Goal: Adequate nutrition will be maintained Outcome: Progressing   Problem: Coping: Goal: Level of anxiety will decrease Outcome: Progressing   Problem: Elimination: Goal: Will not experience complications related to bowel motility Outcome: Progressing Goal: Will not experience complications related to urinary retention Outcome: Progressing   Problem: Pain Managment: Goal: General experience of comfort will improve and/or be controlled Outcome: Progressing   Problem: Safety: Goal: Ability to remain free from injury will improve Outcome: Progressing   Problem: Skin Integrity: Goal: Risk for impaired skin integrity will decrease Outcome: Progressing   Problem: Education: Goal: Knowledge of the prescribed therapeutic regimen will improve Outcome: Progressing Goal: Understanding of discharge needs will improve Outcome: Progressing Goal: Individualized Educational Video(s) Outcome: Progressing   Problem: Activity: Goal: Ability to avoid complications of mobility impairment will improve Outcome: Progressing Goal: Ability to tolerate increased activity will improve Outcome: Progressing   Problem: Clinical Measurements: Goal: Postoperative complications will be avoided or minimized Outcome: Progressing   Problem: Pain Management: Goal: Pain level will decrease with appropriate interventions Outcome: Progressing   Problem: Skin Integrity: Goal: Will show signs of wound healing Outcome: Progressing

## 2023-02-16 NOTE — Plan of Care (Signed)
Problem: Coping: Goal: Ability to adjust to condition or change in health will improve Outcome: Progressing   Problem: Fluid Volume: Goal: Ability to maintain a balanced intake and output will improve Outcome: Progressing   Problem: Health Behavior/Discharge Planning: Goal: Ability to identify and utilize available resources and services will improve Outcome: Progressing Goal: Ability to manage health-related needs will improve Outcome: Progressing   Problem: Nutritional: Goal: Maintenance of adequate nutrition will improve Outcome: Progressing Goal: Progress toward achieving an optimal weight will improve Outcome: Progressing   Problem: Skin Integrity: Goal: Risk for impaired skin integrity will decrease Outcome: Progressing   Problem: Tissue Perfusion: Goal: Adequacy of tissue perfusion will improve Outcome: Progressing

## 2023-02-16 NOTE — Progress Notes (Signed)
PROGRESS NOTE    Sandra [name]  MVH:[id number] DOB: 11/08/1933 DOA: 02/10/2023 PCP: [name], NP    Brief Narrative:  89 year old female with past medical history of HTN, T2DM, HLD, anxiety, history of CVA.  Patient has been in and out of the ER/hospital since 11/24.  On 11/24 she was in the ER, with diarrhea diagnosed with colitis.  Discharged with Cipro and Flagyl.  Patient had ongoing nausea vomiting despite antibiotics.  Admitted 11/30 - 12/10 with acute on chronic kidney disease..  Creatinine 5, baseline 2.7..  Patient treated with IV fluids and antibiotics, discharged home.  Patient continues to be weak.  Her diarrhea has resolved.  Tonight patient got up to go to the restroom.  Her daughter heard her screaming after she fell. Found to have hip fracture and plan for OR on 1/23.   Assessment and Plan: Subcapital fracture of hip, left, closed, initial encounter [facility]) -OR 1/23 -ortho consulted and is s/p surgery -pt/ot eval- SNF -scheduled tylenol, avoid narcotics  Unwitnessed fall in hospital 1/26 -scans of head/neck and hip done -re-eval from ortho: CT scan shows just a small fracture in the greater trochanteric area of the hip but this is small and minimal. From an orthopedic standpoint, she can still be up with assistance and full weightbearing on that left hip but only needs to be up with assistance. Therapy should work on balance and mobility and no hip abduction and no hip strengthening for the short-term. Her left hip incision has no dressing because she has pulled it off. Nursing at the bedside said that her cognitive function is improving in terms of being less confused. They will put another Aquacel on the left hip dressing at some point today. I would limit pain medication to just Tylenol for now.    UTI -culture pending -IV abx x 5 days  Anxiety state -Celexa resumed -Klonopin as needed resumed   History of CVA -resume plavix   Benign essential  HTN -Hydralazine resumed. -Metoprolol held with patient's borderline BP   Diabetes mellitus type 2 with retinopathy (HCC) -Sliding scale insulin initiated   dyslipidemia -Atorvastatin resumed   Delirium precautions -seems improved this AM-- still with some confusion to situation but overall better  CKD stage IV -stable  DVT prophylaxis: enoxaparin (LOVENOX) injection 40 mg Start: 02/16/23 1600 Place and maintain sequential compression device Start: 02/14/23 1751 SCDs Start: 02/11/23 1530    Code Status: Limited: Do not attempt resuscitation (DNR) -DNR-LIMITED -Do Not Intubate/DNI  Updated MPOA: Valentina Shaggy Kirkman---[phone]  Disposition Plan:  Level of care: Med-Surg Status is: Inpatient     Consultants:  ortho   Subjective: No SOB, still thinks Redge Gainer has gone to her house and re-arranged her furniture  Objective: Vitals:   02/15/23 1419 02/15/23 2022 02/16/23 0515 02/16/23 0753  BP: (!) 119/52 (!) 133/47 (!) 145/82 138/78  Pulse: 72 61 68 65  Resp:  20 18 17   Temp: (!) 97.5 F (36.4 C) 97.8 F (36.6 C) 97.7 F (36.5 C) 98 F (36.7 C)  TempSrc: Oral Oral Oral Oral  SpO2: 100% 97% 100% 96%  Weight:      Height:       No intake or output data in the 24 hours ending 02/16/23 1101  Filed Weights   02/11/23 1051  Weight: 51 kg    Examination:    General: Appearance:    Well developed, well nourished female in no acute distress     Lungs:  respirations unlabored  Heart:    Normal heart rate.  MS:   All extremities are intact.   Neurologic:   Awake, alert- some confusion to situation, irritable at times       Data Reviewed: I have personally reviewed following labs and imaging studies  CBC: Recent Labs  Lab 02/10/23 1728 02/11/23 0419 02/12/23 0621 02/13/23 0549 02/15/23 0509 02/16/23 0542  WBC 6.1 7.4 13.5* 13.6* 10.6* 9.0  NEUTROABS 3.9 6.0  --   --   --   --   HGB 8.9* 8.7* 7.0* 8.3* 7.6* 8.3*  HCT 27.4* 27.0* 21.3* 25.9*  23.8* 25.6*  MCV 96.5 97.5 97.7 97.4 96.0 96.2  PLT 233 223 184 230 255 254   Basic Metabolic Panel: Recent Labs  Lab 02/11/23 0419 02/12/23 0621 02/13/23 0549 02/15/23 0509 02/16/23 0542  NA 137 138 139 139 138  K 4.6 4.6 4.2 3.6 3.6  CL 106 109 107 107 108  CO2 20* 20* 20* 19* 18*  GLUCOSE 287* 265* 202* 164* 195*  BUN 26* 31* 35* 40* 41*  CREATININE 2.15* 2.46* 2.50* 2.61* 2.47*  CALCIUM 8.2* 7.9* 8.5* 8.4* 8.2*   GFR: Estimated Creatinine Clearance: 11.5 mL/min (A) (by C-G formula based on SCr of 2.47 mg/dL (H)). Liver Function Tests: No results for input(s): "AST", "ALT", "ALKPHOS", "BILITOT", "PROT", "ALBUMIN" in the last 168 hours. No results for input(s): "LIPASE", "AMYLASE" in the last 168 hours. No results for input(s): "AMMONIA" in the last 168 hours. Coagulation Profile: Recent Labs  Lab 02/10/23 1728  INR 1.0   Cardiac Enzymes: No results for input(s): "CKTOTAL", "CKMB", "CKMBINDEX", "TROPONINI" in the last 168 hours. BNP (last 3 results) No results for input(s): "PROBNP" in the last 8760 hours. HbA1C: No results for input(s): "HGBA1C" in the last 72 hours. CBG: Recent Labs  Lab 02/15/23 0754 02/15/23 1109 02/15/23 1629 02/15/23 2019 02/16/23 0912  GLUCAP 151* 269* 135* 140* 192*   Lipid Profile: No results for input(s): "CHOL", "HDL", "LDLCALC", "TRIG", "CHOLHDL", "LDLDIRECT" in the last 72 hours. Thyroid Function Tests: No results for input(s): "TSH", "T4TOTAL", "FREET4", "T3FREE", "THYROIDAB" in the last 72 hours. Anemia Panel: No results for input(s): "VITAMINB12", "FOLATE", "FERRITIN", "TIBC", "IRON", "RETICCTPCT" in the last 72 hours. Sepsis Labs: No results for input(s): "PROCALCITON", "LATICACIDVEN" in the last 168 hours.  Recent Results (from the past 240 hours)  Surgical pcr screen     Status: None   Collection Time: 02/11/23 11:40 AM   Specimen: Nasal Mucosa; Nasal Swab  Result Value Ref Range Status   MRSA, PCR NEGATIVE NEGATIVE  Final   Staphylococcus aureus NEGATIVE NEGATIVE Final    Comment: (NOTE) The Xpert SA Assay (FDA approved for NASAL specimens in patients 44 years of age and older), is one component of a comprehensive surveillance program. It is not intended to diagnose infection nor to guide or monitor treatment. Performed at [facility], [address] [place], [place] [address]   Urine Culture     Status: Abnormal   Collection Time: 02/13/23  5:30 PM   Specimen: Urine, Clean Catch  Result Value Ref Range Status   Specimen Description URINE, CLEAN CATCH  Final   Special Requests   Final    NONE Performed at [facility], [address] [place], [place] [address]    Culture >=100,000 COLONIES/mL YEAST (A)  Final   Report Status 02/15/2023 FINAL  Final         Radiology Studies: CT HEAD  WO CONTRAST ( ) Result Date: 02/14/2023 CLINICAL DATA:  Neck trauma (Age >= 65y); Head trauma, focal neuro findings (Age 50-64y) EXAM: CT HEAD WITHOUT CONTRAST CT CERVICAL SPINE WITHOUT CONTRAST TECHNIQUE: Multidetector CT imaging of the head and cervical spine was performed following the standard protocol without intravenous contrast. Multiplanar CT image reconstructions of the cervical spine were also generated. RADIATION DOSE REDUCTION: This exam was performed according to the departmental dose-optimization program which includes automated exposure control, adjustment of the mA and/or kV according to patient size and/or use of iterative reconstruction technique. COMPARISON:  CT head and C-spine 02/10/2023 FINDINGS: CT HEAD FINDINGS Brain: Cerebral ventricle sizes are concordant with the degree of cerebral volume loss. Patchy and confluent areas of decreased attenuation are noted throughout the deep and periventricular white matter of the cerebral hemispheres bilaterally, compatible with chronic microvascular ischemic disease. Similar-appearing right parieto-occipital encephalomalacia. No evidence of  large-territorial acute infarction. No parenchymal hemorrhage. No mass lesion. No extra-axial collection. No mass effect or midline shift. No hydrocephalus. Basilar cisterns are patent. Vascular: No hyperdense vessel. Atherosclerotic calcifications are present within the cavernous internal carotid arteries. Skull: No acute fracture or focal lesion. Sinuses/Orbits: Paranasal sinuses and mastoid air cells are clear. The orbits are unremarkable. Other: Left 11 mm parieto-occipital scalp hematoma formation. CT CERVICAL SPINE FINDINGS Alignment: Stable grade 1 anterolisthesis of C7 on T1. Skull base and vertebrae: C6-C7 anterior cervical discectomy and fusion surgical hardware. Multilevel moderate degenerative changes of the spine. Moderate severe osseous neural foraminal stenosis bilaterally at the C6-C7 level, left greater than right. No acute fracture. No aggressive appearing focal osseous lesion or focal pathologic process. Soft tissues and spinal canal: No prevertebral fluid or swelling. No visible canal hematoma. Upper chest: Unremarkable. Other: Atherosclerotic plaque of the aortic arch and its branches. IMPRESSION: 1. No acute intracranial abnormality. 2. No acute displaced fracture or traumatic listhesis of the cervical spine. 3.  Aortic Atherosclerosis (ICD10-I70.0). Electronically Signed   By: Tish Frederickson M.D.   On: 02/14/2023 18:06   CT CERVICAL SPINE WO CONTRAST Result Date: 02/14/2023 CLINICAL DATA:  Neck trauma (Age >= 65y); Head trauma, focal neuro findings (Age 41-64y) EXAM: CT HEAD WITHOUT CONTRAST CT CERVICAL SPINE WITHOUT CONTRAST TECHNIQUE: Multidetector CT imaging of the head and cervical spine was performed following the standard protocol without intravenous contrast. Multiplanar CT image reconstructions of the cervical spine were also generated. RADIATION DOSE REDUCTION: This exam was performed according to the departmental dose-optimization program which includes automated exposure  control, adjustment of the mA and/or kV according to patient size and/or use of iterative reconstruction technique. COMPARISON:  CT head and C-spine 02/10/2023 FINDINGS: CT HEAD FINDINGS Brain: Cerebral ventricle sizes are concordant with the degree of cerebral volume loss. Patchy and confluent areas of decreased attenuation are noted throughout the deep and periventricular white matter of the cerebral hemispheres bilaterally, compatible with chronic microvascular ischemic disease. Similar-appearing right parieto-occipital encephalomalacia. No evidence of large-territorial acute infarction. No parenchymal hemorrhage. No mass lesion. No extra-axial collection. No mass effect or midline shift. No hydrocephalus. Basilar cisterns are patent. Vascular: No hyperdense vessel. Atherosclerotic calcifications are present within the cavernous internal carotid arteries. Skull: No acute fracture or focal lesion. Sinuses/Orbits: Paranasal sinuses and mastoid air cells are clear. The orbits are unremarkable. Other: Left 11 mm parieto-occipital scalp hematoma formation. CT CERVICAL SPINE FINDINGS Alignment: Stable grade 1 anterolisthesis of C7 on T1. Skull base and vertebrae: C6-C7 anterior cervical discectomy and fusion surgical hardware. Multilevel moderate degenerative changes of the spine.  Moderate severe osseous neural foraminal stenosis bilaterally at the C6-C7 level, left greater than right. No acute fracture. No aggressive appearing focal osseous lesion or focal pathologic process. Soft tissues and spinal canal: No prevertebral fluid or swelling. No visible canal hematoma. Upper chest: Unremarkable. Other: Atherosclerotic plaque of the aortic arch and its branches. IMPRESSION: 1. No acute intracranial abnormality. 2. No acute displaced fracture or traumatic listhesis of the cervical spine. 3.  Aortic Atherosclerosis (ICD10-I70.0). Electronically Signed   By: Tish Frederickson M.D.   On: 02/14/2023 18:06   CT HIP LEFT WO  CONTRAST Result Date: 02/14/2023 CLINICAL DATA:  Fall with injury after recent repair EXAM: CT OF THE LEFT HIP WITHOUT CONTRAST TECHNIQUE: Multidetector CT imaging of the left hip was performed according to the standard protocol. Multiplanar CT image reconstructions were also generated. RADIATION DOSE REDUCTION: This exam was performed according to the departmental dose-optimization program which includes automated exposure control, adjustment of the mA and/or kV according to patient size and/or use of iterative reconstruction technique. COMPARISON:  X-ray intraoperative left hip 02/11/2023, x-ray pelvis 02/10/2023 FINDINGS: Bones/Joint/Cartilage Total left hip arthroplasty with periprosthetic minimally displaced acute intertrochanteric fracture. Ligaments Suboptimally assessed by CT. Muscles and Tendons Subcutaneus soft tissue edema and emphysema along the left hip and proximal thigh soft tissues and musculature likely postsurgical in etiology. Soft tissues Subcutaneus soft tissue edema and emphysema along the left hip and proximal thigh soft tissues and musculature likely postsurgical in etiology. Left gluteal, hip, proximal thigh hematoma formation along the posterolateral aspect. Overlying skin staples. Other: Atherosclerotic plaque. IMPRESSION: 1. Total left hip arthroplasty with periprosthetic minimally displaced acute intertrochanteric fracture. 2. Subcutaneus soft tissue edema and emphysema along the left hip and proximal thigh soft tissues and musculature likely postsurgical in etiology. Differential diagnosis includes infection. Electronically Signed   By: Tish Frederickson M.D.   On: 02/14/2023 17:53         Scheduled Meds:  acetaminophen  1,000 mg Oral TID   acidophilus  2 capsule Oral Daily   aspirin EC  325 mg Oral Q breakfast   atorvastatin  80 mg Oral Daily   clopidogrel  75 mg Oral Daily   docusate sodium  100 mg Oral BID   enoxaparin (LOVENOX) injection  40 mg Subcutaneous Q24H    famotidine  10 mg Oral Daily   feeding supplement  237 mL Oral BID BM   hydrALAZINE  25 mg Oral TID   insulin aspart  0-15 Units Subcutaneous TID WC   insulin aspart  0-5 Units Subcutaneous QHS   insulin aspart  3 Units Subcutaneous TID WC   metoprolol succinate  50 mg Oral Daily   polyethylene glycol  17 g Oral Daily   Continuous Infusions:  cefTRIAXone (ROCEPHIN)  IV 1 g (02/15/23 2123)     LOS: 6 days    Time spent: 45 minutes spent on chart review, discussion with nursing staff, consultants, updating family and interview/physical exam; more than 50% of that time was spent in counseling and/or coordination of care.    Joseph Art, DO [facility] Available via Epic secure chat 7am-7pm After these hours, please refer to coverage provider listed on [email or web address] 02/16/2023, 11:01 AM

## 2023-02-16 NOTE — Care Management Important Message (Signed)
Important Message  Patient Details  Name: Sandra [name] MRN: [id number] Date of Birth: 01-22-1933   Important Message Given:  Yes - Medicare IM     Dorena Bodo 02/16/2023, 4:38 PM

## 2023-02-16 NOTE — TOC Progression Note (Signed)
Transition of Care [facility]) - Progression Note    Patient Details  Name: Sandra [name] MRN: [id number] Date of Birth: 1933/09/13  Transition of Care [facility]) CM/SW Contact  Lorri Frederick, LCSW Phone Number: 02/16/2023, 12:59 PM  Clinical Narrative:   CSW spoke with nephew Valentina Shaggy regarding SNF choice.  They have decided to accept offer at [facility].    Expected Discharge Plan: Skilled Nursing Facility Barriers to Discharge: Continued Medical Work up, SNF Pending bed offer, English as a second language teacher  Expected Discharge Plan and Services In-house Referral: Clinical Social Work   Post Acute Care Choice: Skilled Nursing Facility Living arrangements for the past 2 months: Single Family Home                                       Social Determinants of Health (SDOH) Interventions SDOH Screenings   Food Insecurity: No Food Insecurity (02/11/2023)  Recent Concern: Food Insecurity - Food Insecurity Present (12/19/2022)  Housing: Low Risk  (02/11/2023)  Transportation Needs: No Transportation Needs (02/11/2023)  Utilities: Not At Risk (02/11/2023)  Depression (PHQ2-9): Low Risk  (01/19/2023)  Social Connections: Socially Isolated (02/11/2023)  Tobacco Use: Medium Risk (02/11/2023)    Readmission Risk Interventions    12/21/2022   12:26 PM  Readmission Risk Prevention Plan  Transportation Screening Complete  PCP or Specialist Appt within 3-5 Days Complete  HRI or Home Care Consult Complete  Social Work Consult for Recovery Care Planning/Counseling Complete  Palliative Care Screening Not Applicable  Medication Review Oceanographer) Complete

## 2023-02-17 ENCOUNTER — Telehealth: Payer: Self-pay | Admitting: Gastroenterology

## 2023-02-17 ENCOUNTER — Telehealth: Payer: Self-pay | Admitting: Orthopaedic Surgery

## 2023-02-17 DIAGNOSIS — N390 Urinary tract infection, site not specified: Secondary | ICD-10-CM | POA: Diagnosis not present

## 2023-02-17 DIAGNOSIS — F411 Generalized anxiety disorder: Secondary | ICD-10-CM | POA: Diagnosis not present

## 2023-02-17 DIAGNOSIS — Z8673 Personal history of transient ischemic attack (TIA), and cerebral infarction without residual deficits: Secondary | ICD-10-CM | POA: Diagnosis not present

## 2023-02-17 DIAGNOSIS — N184 Chronic kidney disease, stage 4 (severe): Secondary | ICD-10-CM

## 2023-02-17 DIAGNOSIS — S72012A Unspecified intracapsular fracture of left femur, initial encounter for closed fracture: Secondary | ICD-10-CM | POA: Diagnosis not present

## 2023-02-17 DIAGNOSIS — E1169 Type 2 diabetes mellitus with other specified complication: Secondary | ICD-10-CM

## 2023-02-17 DIAGNOSIS — E785 Hyperlipidemia, unspecified: Secondary | ICD-10-CM

## 2023-02-17 LAB — CBC
HCT: 27.4 % — ABNORMAL LOW (ref 36.0–46.0)
Hemoglobin: 8.8 g/dL — ABNORMAL LOW (ref 12.0–15.0)
MCH: 31.1 pg (ref 26.0–34.0)
MCHC: 32.1 g/dL (ref 30.0–36.0)
MCV: 96.8 fL (ref 80.0–100.0)
Platelets: 278 10*3/uL (ref 150–400)
RBC: 2.83 MIL/uL — ABNORMAL LOW (ref 3.87–5.11)
RDW: 13.1 % (ref 11.5–15.5)
WBC: 6 10*3/uL (ref 4.0–10.5)
nRBC: 0 % (ref 0.0–0.2)

## 2023-02-17 LAB — BASIC METABOLIC PANEL
Anion gap: 13 (ref 5–15)
BUN: 40 mg/dL — ABNORMAL HIGH (ref 8–23)
CO2: 18 mmol/L — ABNORMAL LOW (ref 22–32)
Calcium: 8.1 mg/dL — ABNORMAL LOW (ref 8.9–10.3)
Chloride: 104 mmol/L (ref 98–111)
Creatinine, Ser: 2.53 mg/dL — ABNORMAL HIGH (ref 0.44–1.00)
GFR, Estimated: 18 mL/min — ABNORMAL LOW (ref >60)
Glucose, Bld: 176 mg/dL — ABNORMAL HIGH (ref 70–99)
Potassium: 3.5 mmol/L (ref 3.5–5.1)
Sodium: 135 mmol/L (ref 135–145)

## 2023-02-17 LAB — GLUCOSE, CAPILLARY
Glucose-Capillary: 167 mg/dL — ABNORMAL HIGH (ref 70–99)
Glucose-Capillary: 150 mg/dL — ABNORMAL HIGH (ref 70–99)
Glucose-Capillary: 244 mg/dL — ABNORMAL HIGH (ref 70–99)
Glucose-Capillary: 184 mg/dL — ABNORMAL HIGH (ref 70–99)

## 2023-02-17 MED ORDER — FLUCONAZOLE 100 MG PO TABS: 100.0000 mg | ORAL_TABLET | Freq: Every day | ORAL | 0 refills | Status: DC

## 2023-02-17 MED ORDER — ASPIRIN 81 MG PO TBEC
81.0000 mg | DELAYED_RELEASE_TABLET | Freq: Two times a day (BID) | ORAL | Status: DC
  Administered 2023-02-17 – 2023-02-18 (×2): 81 mg via ORAL
  Filled 2023-02-17 (×2): qty 1

## 2023-02-17 MED ORDER — FLUCONAZOLE 100 MG PO TABS
100.0000 mg | ORAL_TABLET | Freq: Every day | ORAL | Status: DC
  Administered 2023-02-17 – 2023-02-18 (×2): 100 mg via ORAL
  Filled 2023-02-17 (×2): qty 1

## 2023-02-17 MED ORDER — ASPIRIN 81 MG PO TBEC: 81.0000 mg | DELAYED_RELEASE_TABLET | Freq: Two times a day (BID) | ORAL | 0 refills | Status: AC

## 2023-02-17 MED ORDER — ASPIRIN 325 MG PO TBEC: 325.0000 mg | DELAYED_RELEASE_TABLET | Freq: Every day | ORAL | 0 refills | Status: DC

## 2023-02-17 MED ORDER — FLUCONAZOLE 200 MG PO TABS: 200.0000 mg | ORAL_TABLET | Freq: Every day | ORAL | Status: DC

## 2023-02-17 MED ORDER — ASPIRIN 81 MG PO TBEC: 81.0000 mg | DELAYED_RELEASE_TABLET | Freq: Two times a day (BID) | ORAL | Status: DC

## 2023-02-17 MED ORDER — FLUCONAZOLE 200 MG PO TABS: 200.0000 mg | ORAL_TABLET | Freq: Every day | ORAL | 0 refills | Status: DC

## 2023-02-17 MED ORDER — POLYETHYLENE GLYCOL 3350 17 G PO PACK: 17.0000 g | PACK | Freq: Every day | ORAL | 0 refills | Status: DC

## 2023-02-17 MED ORDER — ENSURE ENLIVE PO LIQD: 237.0000 mL | Freq: Two times a day (BID) | ORAL | 0 refills | Status: DC

## 2023-02-17 MED ORDER — CLONAZEPAM 0.5 MG PO TABS: 0.5000 mg | ORAL_TABLET | Freq: Two times a day (BID) | ORAL | 0 refills | Status: DC | PRN

## 2023-02-17 MED ORDER — ASPIRIN 81 MG PO TBEC: 81.0000 mg | DELAYED_RELEASE_TABLET | Freq: Two times a day (BID) | ORAL | 0 refills | Status: DC

## 2023-02-17 NOTE — Hospital Course (Addendum)
Mrs. Dam was admitted to the hospital with the working diagnosis of left hip fracture, subcapital.   89 yo female with the past medical history of hypertension, T2DM, dyslipidemia, anxiety, history of CVA and CKD who presented after a mechanical fall.  She had frequent hospitalizations over the lat 2 months, giving patient persistent weakness. On the day of admission she sustained a mechanical fall while trying to get to the bathroom. Patient complained of severe left hip pain after the fall. EMS was called and patient was transported to the ED.  On her initial physical examination her blood pressure was 150/94, HR 78, RR 18 and 02 saturation 98% on room air.  Lungs with no wheezing or rhonchi, no rales, heart with S1 and S2 present and regular with no gallops, rubs or murmurs, abdomen with no distention, left lower extremity was flexed and drawn, no edema.   Na 136, K 4,3 CL 104 bicarbonate 24, glucose 327 bun 29 cr 2,30  Wbc 6.1 hgb 8,9 plt 233  Urine analysis SG 1,017, protein 100, leukocytes moderate, wbc >50   CT cervical spine and head with no acute intracranial abnormalities, and no acute fracture in the cervical spine.   CT left hip with acute mildly impacted and comminuted subcapital femoral neck fracture with mild apex anterior and medial angulation.   Chest radiograph with hypoinflation with no infiltrates or effusions.   Left femur fracture with acute left femoral neck fracture.   EKG 76 bpm, normal axis, normal intervals, sinus rhythm with no significant ST segment or T wave changes.   Orthopedics was consulted 01/23 Left direct anterior hip hemiarthroplasty  01/26 unwitnessed fall in the hospital with small fracture in the greater trochanter area.  01/29 patient doing well, plan to transfer to SNF today to continue PT and OT.

## 2023-02-17 NOTE — Assessment & Plan Note (Addendum)
Continue with citalopram and clonazepam.

## 2023-02-17 NOTE — TOC Progression Note (Addendum)
Transition of Care [facility]) - Progression Note    Patient Details  Name: Sandra [name] MRN: [id number] Date of Birth: 26-Aug-1933  Transition of Care [facility]) CM/SW Contact  Lorri Frederick, LCSW Phone Number: 02/17/2023, 11:02 AM  Clinical Narrative:   CSW confirmed with [facility] that they can receive pt today.  Need PT note for insurance auth request, PT aware.    1230: Per MD, no DC today.  SNF informed.   Auth request submitted in [place].   Expected Discharge Plan: Skilled Nursing Facility Barriers to Discharge: Continued Medical Work up, SNF Pending bed offer, English as a second language teacher  Expected Discharge Plan and Services In-house Referral: Clinical Social Work   Post Acute Care Choice: Skilled Nursing Facility Living arrangements for the past 2 months: Single Family Home Expected Discharge Date: 02/17/23                                     Social Determinants of Health (SDOH) Interventions SDOH Screenings   Food Insecurity: No Food Insecurity (02/11/2023)  Recent Concern: Food Insecurity - Food Insecurity Present (12/19/2022)  Housing: Low Risk  (02/11/2023)  Transportation Needs: No Transportation Needs (02/11/2023)  Utilities: Not At Risk (02/11/2023)  Depression (PHQ2-9): Low Risk  (01/19/2023)  Social Connections: Socially Isolated (02/11/2023)  Tobacco Use: Medium Risk (02/11/2023)    Readmission Risk Interventions    12/21/2022   12:26 PM  Readmission Risk Prevention Plan  Transportation Screening Complete  PCP or Specialist Appt within 3-5 Days Complete  HRI or Home Care Consult Complete  Social Work Consult for Recovery Care Planning/Counseling Complete  Palliative Care Screening Not Applicable  Medication Review Oceanographer) Complete

## 2023-02-17 NOTE — Assessment & Plan Note (Signed)
Continue antiplatelet therapy with clopidogrel.

## 2023-02-17 NOTE — Assessment & Plan Note (Addendum)
Plan to continue blood pressure control with spironolactone, losartan and metoprolol Hold on hydralazine for now.  Continue with furosemide as needed for signs of volume overload.

## 2023-02-17 NOTE — Assessment & Plan Note (Addendum)
Patient underwent surgical intervention with good toleration.   She had a fall in the hospital with small fracture in the greater trochanteric area of the hip, this is small and minimal. Orthopedics were re consulted, with recommendations of : she can still be up with assistance and full weightbearing on that left hip but only needs to be up with assistance. Therapy should work on balance and mobility and no hip abduction and no hip strengthening for the short-term.   She can be up but only with assistance using a walker and with people assisting her with full weightbearing as tolerated on that left operative hip.   Mild post operative anemia, combined with chronic anemia. A the time of her discharge her cell count hgb is 8,8   Post operative acute metabolic encephalopathy with delirium.  Patient was placed on supportive medical therapy, encephalopathy has resolved.

## 2023-02-17 NOTE — Progress Notes (Signed)
Patient ID: Sandra [name], female   DOB: [birth year], 89 y.o.   MRN: [id number] The patient is currently sitting at her bedside chair.  She does report some left hip pain that is appropriate pain that she is experiencing having had surgery on that left hip for hip fracture and given her recent fall during this hospitalization as well.  The hip itself is stable.  Her leg lengths are near equal.  I can easily put her hip through range of motion with just some moderate pain.  Her incision itself looks good.  She keeps removing the Aquacel dressing but overall the staples are intact and there is no complicating features of the surgery.  From an orthopedic standpoint she needs to be on a baby aspirin twice daily for the next 2 weeks.  She can be up but only with assistance using a walker and with people assisting her with full weightbearing as tolerated on that left operative hip.

## 2023-02-17 NOTE — Telephone Encounter (Signed)
The pt daughter has been advised that she should speak with the nurse on the floor and discuss her concerns.  The pt has been advised of the information and verbalized understanding.

## 2023-02-17 NOTE — Telephone Encounter (Signed)
Inbound call from patient's daughter stating patient has been having diarrhea for 3 days. Patient is currently at [facility] and wanted to inform Dr. Adela Lank to see if there is anything that he could help with. Patient's daugher states they would like to release patient and put her in rehab. Patient's daughter requesting a call back to discuss further. Please advise, thank you.

## 2023-02-17 NOTE — Telephone Encounter (Signed)
Patient's daughter called. Says the PT is not getting done in the hospital. Was told they didn't have time. Would like Dr. Magnus Ivan to visit the hospital. [phone]

## 2023-02-17 NOTE — Progress Notes (Signed)
Occupational Therapy Treatment Patient Details Name: Sandra [name] MRN: [id number] DOB: 01-30-1933 Today's Date: 02/17/2023   History of present illness 89 y.o. female presents to [facility] 02/10/23 after falling w/ L subcapital neck fx. S/p L anterior direct hip hemiarthroplasty 1/23. Patient had another unwitnessed fall in the hospital, resulting in a small fx of the greater trochanter.  Multiple prior hospital visits since 11/24 for diarrhea and acute on CKD. PMH includes: DM2, diabetic retinopathy, essential HTN, CKD, TIA, depression   OT comments  Pt progressed from bed to [facility] to chair for meal this session. Pt with cognitive deficits affecting safety and awareness. Pt dropping item in chair and demonstrates poor safety awareness with initiation to try to reach for object instead of asking for help. Pt is unaware of cognitive deficits at this time and asking secondary questions when reoriented. Recommendation for skilled inpatient follow up therapy, <3 hours/day.       If plan is discharge home, recommend the following:  A lot of help with walking and/or transfers;A lot of help with bathing/dressing/bathroom   Equipment Recommendations  Other (comment);Wheelchair cushion (measurements OT);Wheelchair (measurements OT)    Recommendations for Other Services      Precautions / Restrictions Precautions Precautions: Fall Precaution Booklet Issued: No Precaution Comments: anterior hip Restrictions Weight Bearing Restrictions Per Provider Order: Yes LLE Weight Bearing Per Provider Order: Weight bearing as tolerated       Mobility Bed Mobility Overal bed mobility: Needs Assistance Bed Mobility: Rolling, Supine to Sit Rolling: Supervision   Supine to sit: Supervision     General bed mobility comments: pt with mod cues to sequence but pt able to progress without physical (A)    Transfers Overall transfer level: Needs assistance Equipment used: Rolling walker (2 wheels) Transfers:  Sit to/from Stand Sit to Stand: Contact guard assist           General transfer comment: pt with cues for safety     Balance Overall balance assessment: Needs assistance Sitting-balance support: Bilateral upper extremity supported, Feet supported Sitting balance-Leahy Scale: Good     Standing balance support: Bilateral upper extremity supported, During functional activity, Reliant on assistive device for balance Standing balance-Leahy Scale: Fair                             ADL either performed or assessed with clinical judgement   ADL Overall ADL's : Needs assistance/impaired Eating/Feeding: Set up;Sitting Eating/Feeding Details (indicate cue type and reason): pt declined outside food in sub bag and instead eating the spaghetti provided by hosptial Grooming: Wash/dry face;Sitting                   Toilet Transfer: Minimal Market researcher Details (indicate cue type and reason): incontinence of stool Toileting- Clothing Manipulation and Hygiene: Maximal assistance         General ADL Comments: pt agreeable to therapy and progressed to chair to eat under supervision. tech helping back to bed after eating    Extremity/Trunk Assessment Upper Extremity Assessment Upper Extremity Assessment: Generalized weakness   Lower Extremity Assessment Lower Extremity Assessment: Generalized weakness        Vision       Perception     Praxis      Cognition Arousal: Alert Behavior During Therapy: WFL for tasks assessed/performed Overall Cognitive Status: Impaired/Different from baseline Area of Impairment: Memory  Memory: Decreased recall of precautions, Decreased short-term memory         General Comments: pt perseverating on "Buzz" being at her house and now at hospital so not trusting him. Pt explaining a very long story about prior to admission and how she was pushe down to break her leg.  Pt needs redirection throughout session due to her perseveration and internal distraction. pt then reports feeling worried about her daughter because she had not returned. Pt poor recall of information after several minutes.        Exercises Exercises: Other exercises Other Exercises Other Exercises: pt educated on quad sets and demonstrates x15 Other Exercises: pt educated on bil UE bicep and tricep exercises x15 reps    Shoulder Instructions       General Comments      Pertinent Vitals/ Pain       Pain Assessment Pain Assessment: No/denies pain Pain Intervention(s): Monitored during session, Premedicated before session, Repositioned  Home Living                                          Prior Functioning/Environment              Frequency  Min 1X/week        Progress Toward Goals  OT Goals(current goals can now be found in the care plan section)  Progress towards OT goals: Progressing toward goals  Acute Rehab OT Goals Patient Stated Goal: to talk to joy OT Goal Formulation: With patient Time For Goal Achievement: 02/26/23 Potential to Achieve Goals: Good ADL Goals Pt Will Perform Grooming: with modified independence;sitting Pt Will Perform Upper Body Bathing: with modified independence;sitting Pt Will Perform Lower Body Bathing: with min assist;sit to/from stand Pt Will Perform Lower Body Dressing: with min assist;with adaptive equipment;sit to/from stand Pt Will Transfer to Toilet: with min assist;stand pivot transfer;bedside commode Additional ADL Goal #1: pt will complete bed mobility min (A) as precursor to adls.  Plan      Co-evaluation                 AM-PAC OT "6 Clicks" Daily Activity     Outcome Measure   Help from another person eating meals?: A Little Help from another person taking care of personal grooming?: A Little Help from another person toileting, which includes using toliet, bedpan, or urinal?: A Lot Help  from another person bathing (including washing, rinsing, drying)?: A Lot Help from another person to put on and taking off regular upper body clothing?: A Little Help from another person to put on and taking off regular lower body clothing?: A Lot 6 Click Score: 15    End of Session Equipment Utilized During Treatment: Gait belt;Rolling walker (2 wheels)  OT Visit Diagnosis: Unsteadiness on feet (R26.81);Muscle weakness (generalized) (M62.81)   Activity Tolerance Patient tolerated treatment well   Patient Left in chair;with call bell/phone within reach;with chair alarm set;with nursing/sitter in room   Nurse Communication Mobility status;Precautions;Weight bearing status        Time: 2202-5427 OT Time Calculation (min): 32 min  Charges: OT General Charges $OT Visit: 1 Visit OT Treatments $Self Care/Home Management : 23-37 mins   Brynn, OTR/L  Acute Rehabilitation Services Office: [phone] .   Mateo Flow 02/17/2023, 2:51 PM

## 2023-02-17 NOTE — Assessment & Plan Note (Addendum)
Urine cultures with 100, 000 CFU yeast.   Patient was placed on IV ceftriaxone during her hospitalization, at the time of her discharge she will continue with fluconazole for 2 weeks.

## 2023-02-17 NOTE — Assessment & Plan Note (Signed)
A the time of her discharge her renal function has a serum cr of 2,53 with K at 3,5 and serum bicarbonate at 18  Na 135  Plan to follow up renal function and electrolytes as outpatient.

## 2023-02-17 NOTE — Assessment & Plan Note (Addendum)
Patient was placed on insulin therapy for glucose cover and monitoring.  Her glucose remained stable.  At discharge she will resume her diabetic regimen.   Continue with statin therapy.

## 2023-02-17 NOTE — Progress Notes (Signed)
Physical Therapy Treatment Patient Details Name: Sandra [name] MRN: [id number] DOB: Jun 25, 1933 Today's Date: 02/17/2023   History of Present Illness 89 y.o. female presents to [facility] 02/10/23 after falling w/ L subcapital neck fx. S/p L anterior direct hip hemiarthroplasty 1/23. Patient had another unwitnessed fall in the hospital, resulting in a small fx of the greater trochanter.  Multiple prior hospital visits since 11/24 for diarrhea and acute on CKD. PMH includes: DM2, diabetic retinopathy, essential HTN, CKD, TIA, depression    PT Comments  Pt eager to mobilize out of bed and walk. Pt continues with bowel incontinence, thus limiting mobility somewhat; assisted to toilet and with peri care and recommended brief for future attempts. Session focused on transitions to standing and progressive ambulation. Pt ambulating 60 ft with a walker and up to min assist. Demonstrates slowed gait speed, which in addition to history of falls, increases fall risk. Patient will benefit from continued inpatient follow up therapy, <3 hours/day.   If plan is discharge home, recommend the following: A lot of help with bathing/dressing/bathroom;Assistance with cooking/housework;Assist for transportation;Help with stairs or ramp for entrance;A little help with walking and/or transfers   Can travel by private vehicle     No  Equipment Recommendations  Other (comment) (defer)    Recommendations for Other Services OT consult     Precautions / Restrictions Precautions Precautions: Fall Precaution Booklet Issued: No Precaution Comments: No L Hip ABD or L Hip Strengthening. Bowel incontinence Restrictions Weight Bearing Restrictions Per Provider Order: Yes LLE Weight Bearing Per Provider Order: Weight bearing as tolerated     Mobility  Bed Mobility Overal bed mobility: Needs Assistance Bed Mobility: Supine to Sit     Supine to sit: Supervision     General bed mobility comments: Pt was able to transition  to EOB w/o assistance.    Transfers Overall transfer level: Needs assistance Equipment used: Rolling walker (2 wheels) Transfers: Sit to/from Stand Sit to Stand: Contact guard assist           General transfer comment: CGA for safety to transition to standing from EOB and toilet    Ambulation/Gait Ambulation/Gait assistance: Min assist Gait Distance (Feet): 60 Feet Assistive device: Rolling walker (2 wheels) Gait Pattern/deviations: Decreased stride length, Step-to pattern Gait velocity: Decreased Gait velocity interpretation: <1.31 ft/sec, indicative of household ambulator   General Gait Details: cues for activity pacing, shortened step to gait pattern. intermittent assist for steering walker   Stairs             Wheelchair Mobility     Tilt Bed    Modified Rankin (Stroke Patients Only)       Balance Overall balance assessment: Needs assistance Sitting-balance support: Feet supported, Bilateral upper extremity supported Sitting balance-Leahy Scale: Good     Standing balance support: Bilateral upper extremity supported Standing balance-Leahy Scale: Fair                              Cognition Arousal: Alert Behavior During Therapy: WFL for tasks assessed/performed Overall Cognitive Status: Impaired/Different from baseline Area of Impairment: Memory                     Memory: Decreased short-term memory         General Comments: Pt daughter reports pt with acute confusion        Exercises      General Comments  Pertinent Vitals/Pain Pain Assessment Pain Assessment: Faces Faces Pain Scale: Hurts a little bit Pain Location: L hip area Pain Descriptors / Indicators: Discomfort, Grimacing, Guarding, Sore Pain Intervention(s): Monitored during session    Home Living                          Prior Function            PT Goals (current goals can now be found in the care plan section) Acute Rehab  PT Goals PT Goal Formulation: With patient Time For Goal Achievement: 02/26/23 Potential to Achieve Goals: Good Progress towards PT goals: Progressing toward goals    Frequency    Min 1X/week      PT Plan      Co-evaluation              AM-PAC PT "6 Clicks" Mobility   Outcome Measure  Help needed turning from your back to your side while in a flat bed without using bedrails?: A Little Help needed moving from lying on your back to sitting on the side of a flat bed without using bedrails?: A Little Help needed moving to and from a bed to a chair (including a wheelchair)?: A Little Help needed standing up from a chair using your arms (e.g., wheelchair or bedside chair)?: A Little Help needed to walk in hospital room?: A Little Help needed climbing 3-5 steps with a railing? : A Lot 6 Click Score: 17    End of Session Equipment Utilized During Treatment: Gait belt Activity Tolerance: Patient tolerated treatment well Patient left: in chair;with call bell/phone within reach;with chair alarm set;with family/visitor present Nurse Communication: Mobility status PT Visit Diagnosis: Unsteadiness on feet (R26.81);History of falling (Z91.81);Muscle weakness (generalized) (M62.81)     Time: 1610-9604 PT Time Calculation (min) (ACUTE ONLY): 32 min  Charges:    $Therapeutic Activity: 23-37 mins PT General Charges $$ ACUTE PT VISIT: 1 Visit                     Lillia Pauls, PT, DPT Acute Rehabilitation Services Office [phone]    Norval Morton 02/17/2023, 11:47 AM

## 2023-02-17 NOTE — Discharge Summary (Addendum)
Physician Discharge Summary   Patient: Sandra [name] MRN: [id number] DOB: 10-24-1933  Admit date:     02/10/2023  Discharge date: 02/17/23  Discharge Physician: York Ram Dayvian Blixt   PCP: [name], NP   Recommendations at discharge:    Patient will continue fluconazole for cystitis UTi for 2 weeks. Continue aspirin 81 mg bid for 2 weeks for DVT prophylaxis  Follow up cell count and renal function plus electrolytes in 7 days Follow up with Dinah Ngetich NP in 7 to 10 days.  Follow up with orthopedics as scheduled.   Discharge Diagnoses: Principal Problem:   Subcapital fracture of hip, left, closed, initial encounter (HCC) Active Problems:   UTI (urinary tract infection)   Anxiety state   History of CVA (cerebrovascular accident)   Essential hypertension   Type 2 diabetes mellitus with hyperlipidemia (HCC)   CKD (chronic kidney disease) stage 4, GFR 15-29 ml/min (HCC)  Resolved Problems:   * No resolved hospital problems. [place] [facility] Course: Mrs. Lippert was admitted to the hospital with the working diagnosis of left hip fracture, subcapital.   89 yo female with the past medical history of hypertension, T2DM, dyslipidemia, anxiety, history of CVA and CKD who presented after a mechanical fall.  She had frequent hospitalizations over the lat 2 months, giving patient persistent weakness. On the day of admission she sustained a mechanical fall while trying to get to the bathroom. Patient complained of severe left hip pain after the fall. EMS was called and patient was transported to the ED.  On her initial physical examination her blood pressure was 150/94, HR 78, RR 18 and 02 saturation 98% on room air.  Lungs with no wheezing or rhonchi, no rales, heart with S1 and S2 present and regular with no gallops, rubs or murmurs, abdomen with no distention, left lower extremity was flexed and drawn, no edema.   Na 136, K 4,3 CL 104 bicarbonate 24, glucose 327 bun 29 cr 2,30  Wbc  6.1 hgb 8,9 plt 233  Urine analysis SG 1,017, protein 100, leukocytes moderate, wbc >50   CT cervical spine and head with no acute intracranial abnormalities, and no acute fracture in the cervical spine.   CT left hip with acute mildly impacted and comminuted subcapital femoral neck fracture with mild apex anterior and medial angulation.   Chest radiograph with hypoinflation with no infiltrates or effusions.   Left femur fracture with acute left femoral neck fracture.   EKG 76 bpm, normal axis, normal intervals, sinus rhythm with no significant ST segment or T wave changes.   Orthopedics was consulted 01/23 Left direct anterior hip hemiarthroplasty  01/26 unwitnessed fall in the hospital with small fracture in the greater trochanter area.  01/29 patient doing well, plan to transfer to SNF today to continue PT and OT.   Assessment and Plan: * Subcapital fracture of hip, left, closed, initial encounter [facility]) Patient underwent surgical intervention with good toleration.   She had a fall in the hospital with small fracture in the greater trochanteric area of the hip, this is small and minimal. Orthopedics were re consulted, with recommendations of : she can still be up with assistance and full weightbearing on that left hip but only needs to be up with assistance. Therapy should work on balance and mobility and no hip abduction and no hip strengthening for the short-term.   She can be up but only with assistance using a walker and with people assisting her with full weightbearing  as tolerated on that left operative hip.   Mild post operative anemia, combined with chronic anemia. A the time of her discharge her cell count hgb is 8,8   Post operative acute metabolic encephalopathy with delirium.  Patient was placed on supportive medical therapy, encephalopathy has resolved.   UTI (urinary tract infection) Urine cultures with 100, 000 CFU yeast.   Patient was placed on IV ceftriaxone  during her hospitalization, at the time of her discharge she will continue with fluconazole for 2 weeks.   Anxiety state Continue with citalopram and clonazepam.    History of CVA (cerebrovascular accident) Continue antiplatelet therapy with clopidogrel.   Essential hypertension Continue blood pressure control with hydralazine and metoprolol.   Type 2 diabetes mellitus with hyperlipidemia (HCC) Patient was placed on insulin therapy for glucose cover and monitoring.  Her glucose remained stable.  At discharge she will resume her diabetic regimen.   Continue with statin therapy.   CKD (chronic kidney disease) stage 4, GFR 15-29 ml/min (HCC) A the time of her discharge her renal function has a serum cr of 2,53 with K at 3,5 and serum bicarbonate at 18  Na 135  Plan to follow up renal function and electrolytes as outpatient.         Consultants: orthopedics  Procedures performed: left hip hemiarthroplasty   Disposition: Nursing home Diet recommendation:  Discharge Diet Orders (From admission, onward)     Start     Ordered   02/17/23 0000  Diet - low sodium heart healthy        02/17/23 1055           Cardiac and Carb modified diet DISCHARGE MEDICATION: Allergies as of 02/17/2023       Reactions   Cephalexin Nausea And Vomiting   Pt stated made severely sick, will never take again   Pioglitazone Swelling   REACTION: EDEMA   Nintedanib Diarrhea   Sertraline Nausea And Vomiting        Medication List     STOP taking these medications    furosemide 20 MG tablet Commonly known as: LASIX   hydrOXYzine 10 MG tablet Commonly known as: ATARAX   potassium chloride SA 20 MEQ tablet Commonly known as: KLOR-CON M   sodium bicarbonate 650 MG tablet       TAKE these medications    acetaminophen 500 MG tablet Commonly known as: TYLENOL Take 500 mg by mouth every 6 (six) hours as needed for mild pain or headache.   aspirin EC 81 MG tablet Take 1 tablet  (81 mg total) by mouth 2 (two) times daily for 14 days. Swallow whole.   atorvastatin 80 MG tablet Commonly known as: LIPITOR TAKE 1 TABLET EVERY DAY What changed: when to take this   citalopram 10 MG tablet Commonly known as: CELEXA TAKE 1 TABLET BY MOUTH EVERY DAY What changed:  when to take this reasons to take this   clonazePAM 0.5 MG tablet Commonly known as: KLONOPIN Take 1 tablet (0.5 mg total) by mouth 2 (two) times daily as needed for anxiety.   clopidogrel 75 MG tablet Commonly known as: PLAVIX TAKE 1 TABLET EVERY DAY   famotidine 40 MG tablet Commonly known as: PEPCID TAKE 1 TABLET EVERY DAY   feeding supplement Liqd Take 237 mLs by mouth 2 (two) times daily between meals.   fluconazole 100 MG tablet Commonly known as: DIFLUCAN Take 1 tablet (100 mg total) by mouth daily for 14 days.   hydrALAZINE  25 MG tablet Commonly known as: APRESOLINE Take 1 tablet (25 mg total) by mouth 3 (three) times daily.   metoprolol succinate 50 MG 24 hr tablet Commonly known as: TOPROL-XL Take 1 tablet (50 mg total) by mouth daily. Take with or immediately following a meal.   NovoLOG FlexPen 100 UNIT/ML FlexPen Generic drug: insulin aspart Inject 6-8 Units into the skin 3 (three) times daily with meals as needed for high blood sugar. Pt completes sliding scale.   polyethylene glycol 17 g packet Commonly known as: MIRALAX / GLYCOLAX Take 17 g by mouth daily. Start taking on: February 18, 2023               Durable Medical Equipment  (From admission, onward)           Start     Ordered   02/11/23 1530  DME 3 n 1  Once        02/11/23 1529   02/11/23 1530  DME Walker rolling  Once       Question Answer Comment  Walker: With 5 Inch Wheels   Patient needs a walker to treat with the following condition Status post hip hemiarthroplasty      02/11/23 1529            Follow-up Information     Kathryne Hitch, MD. Schedule an appointment as soon as  possible for a visit in 2 week(s).   Specialty: Orthopedic Surgery Contact information: [address] [place] [address] [phone]                Discharge Exam: Ceasar Mons Weights   02/11/23 1051  Weight: 51 kg   BP (!) 153/46 (BP Location: Right Arm)   Pulse 61   Temp 98.2 F (36.8 C) (Oral)   Resp 16   Ht 5' (1.524 m)   Wt 51 kg   SpO2 100%   BMI 21.96 kg/m   Patient is feeling well, her left hip pain is controlled, no nausea or vomiting.   Neurology awake and alert ENT with mild pallor Cardiovascular with S1 and S2 present and regular with no gallops, rubs or murmurs Respiratory with no rales or wheezing Abdomen with no distention  No lower extremity edema   Condition at discharge: stable  The results of significant diagnostics from this hospitalization (including imaging, microbiology, ancillary and laboratory) are listed below for reference.   Imaging Studies: CT HEAD WO CONTRAST ( ) Result Date: 02/14/2023 CLINICAL DATA:  Neck trauma (Age >= 65y); Head trauma, focal neuro findings (Age 88-64y) EXAM: CT HEAD WITHOUT CONTRAST CT CERVICAL SPINE WITHOUT CONTRAST TECHNIQUE: Multidetector CT imaging of the head and cervical spine was performed following the standard protocol without intravenous contrast. Multiplanar CT image reconstructions of the cervical spine were also generated. RADIATION DOSE REDUCTION: This exam was performed according to the departmental dose-optimization program which includes automated exposure control, adjustment of the mA and/or kV according to patient size and/or use of iterative reconstruction technique. COMPARISON:  CT head and C-spine 02/10/2023 FINDINGS: CT HEAD FINDINGS Brain: Cerebral ventricle sizes are concordant with the degree of cerebral volume loss. Patchy and confluent areas of decreased attenuation are noted throughout the deep and periventricular white matter of the cerebral hemispheres bilaterally, compatible with  chronic microvascular ischemic disease. Similar-appearing right parieto-occipital encephalomalacia. No evidence of large-territorial acute infarction. No parenchymal hemorrhage. No mass lesion. No extra-axial collection. No mass effect or midline shift. No hydrocephalus. Basilar cisterns are patent. Vascular: No hyperdense  vessel. Atherosclerotic calcifications are present within the cavernous internal carotid arteries. Skull: No acute fracture or focal lesion. Sinuses/Orbits: Paranasal sinuses and mastoid air cells are clear. The orbits are unremarkable. Other: Left 11 mm parieto-occipital scalp hematoma formation. CT CERVICAL SPINE FINDINGS Alignment: Stable grade 1 anterolisthesis of C7 on T1. Skull base and vertebrae: C6-C7 anterior cervical discectomy and fusion surgical hardware. Multilevel moderate degenerative changes of the spine. Moderate severe osseous neural foraminal stenosis bilaterally at the C6-C7 level, left greater than right. No acute fracture. No aggressive appearing focal osseous lesion or focal pathologic process. Soft tissues and spinal canal: No prevertebral fluid or swelling. No visible canal hematoma. Upper chest: Unremarkable. Other: Atherosclerotic plaque of the aortic arch and its branches. IMPRESSION: 1. No acute intracranial abnormality. 2. No acute displaced fracture or traumatic listhesis of the cervical spine. 3.  Aortic Atherosclerosis (ICD10-I70.0). Electronically Signed   By: Tish Frederickson M.D.   On: 02/14/2023 18:06   CT CERVICAL SPINE WO CONTRAST Result Date: 02/14/2023 CLINICAL DATA:  Neck trauma (Age >= 65y); Head trauma, focal neuro findings (Age 18-64y) EXAM: CT HEAD WITHOUT CONTRAST CT CERVICAL SPINE WITHOUT CONTRAST TECHNIQUE: Multidetector CT imaging of the head and cervical spine was performed following the standard protocol without intravenous contrast. Multiplanar CT image reconstructions of the cervical spine were also generated. RADIATION DOSE REDUCTION: This  exam was performed according to the departmental dose-optimization program which includes automated exposure control, adjustment of the mA and/or kV according to patient size and/or use of iterative reconstruction technique. COMPARISON:  CT head and C-spine 02/10/2023 FINDINGS: CT HEAD FINDINGS Brain: Cerebral ventricle sizes are concordant with the degree of cerebral volume loss. Patchy and confluent areas of decreased attenuation are noted throughout the deep and periventricular white matter of the cerebral hemispheres bilaterally, compatible with chronic microvascular ischemic disease. Similar-appearing right parieto-occipital encephalomalacia. No evidence of large-territorial acute infarction. No parenchymal hemorrhage. No mass lesion. No extra-axial collection. No mass effect or midline shift. No hydrocephalus. Basilar cisterns are patent. Vascular: No hyperdense vessel. Atherosclerotic calcifications are present within the cavernous internal carotid arteries. Skull: No acute fracture or focal lesion. Sinuses/Orbits: Paranasal sinuses and mastoid air cells are clear. The orbits are unremarkable. Other: Left 11 mm parieto-occipital scalp hematoma formation. CT CERVICAL SPINE FINDINGS Alignment: Stable grade 1 anterolisthesis of C7 on T1. Skull base and vertebrae: C6-C7 anterior cervical discectomy and fusion surgical hardware. Multilevel moderate degenerative changes of the spine. Moderate severe osseous neural foraminal stenosis bilaterally at the C6-C7 level, left greater than right. No acute fracture. No aggressive appearing focal osseous lesion or focal pathologic process. Soft tissues and spinal canal: No prevertebral fluid or swelling. No visible canal hematoma. Upper chest: Unremarkable. Other: Atherosclerotic plaque of the aortic arch and its branches. IMPRESSION: 1. No acute intracranial abnormality. 2. No acute displaced fracture or traumatic listhesis of the cervical spine. 3.  Aortic Atherosclerosis  (ICD10-I70.0). Electronically Signed   By: Tish Frederickson M.D.   On: 02/14/2023 18:06   CT HIP LEFT WO CONTRAST Result Date: 02/14/2023 CLINICAL DATA:  Fall with injury after recent repair EXAM: CT OF THE LEFT HIP WITHOUT CONTRAST TECHNIQUE: Multidetector CT imaging of the left hip was performed according to the standard protocol. Multiplanar CT image reconstructions were also generated. RADIATION DOSE REDUCTION: This exam was performed according to the departmental dose-optimization program which includes automated exposure control, adjustment of the mA and/or kV according to patient size and/or use of iterative reconstruction technique. COMPARISON:  X-ray intraoperative left  hip 02/11/2023, x-ray pelvis 02/10/2023 FINDINGS: Bones/Joint/Cartilage Total left hip arthroplasty with periprosthetic minimally displaced acute intertrochanteric fracture. Ligaments Suboptimally assessed by CT. Muscles and Tendons Subcutaneus soft tissue edema and emphysema along the left hip and proximal thigh soft tissues and musculature likely postsurgical in etiology. Soft tissues Subcutaneus soft tissue edema and emphysema along the left hip and proximal thigh soft tissues and musculature likely postsurgical in etiology. Left gluteal, hip, proximal thigh hematoma formation along the posterolateral aspect. Overlying skin staples. Other: Atherosclerotic plaque. IMPRESSION: 1. Total left hip arthroplasty with periprosthetic minimally displaced acute intertrochanteric fracture. 2. Subcutaneus soft tissue edema and emphysema along the left hip and proximal thigh soft tissues and musculature likely postsurgical in etiology. Differential diagnosis includes infection. Electronically Signed   By: Tish Frederickson M.D.   On: 02/14/2023 17:53   DG HIP UNILAT WITH PELVIS 1V LEFT Result Date: 02/11/2023 CLINICAL DATA:  Elective surgery. EXAM: DG HIP (WITH OR WITHOUT PELVIS) 1V*L* COMPARISON:  Preoperative imaging FINDINGS: Three fluoroscopic  spot views of the pelvis and left hip obtained in the operating room. Images during hip arthroplasty. Fluoroscopy time 12 seconds. Dose 0.636 mGy. IMPRESSION: Procedural fluoroscopy during left hip arthroplasty. Electronically Signed   By: Narda Rutherford M.D.   On: 02/11/2023 13:29   DG C-Arm 1-60 Min-No Report Result Date: 02/11/2023 Fluoroscopy was utilized by the requesting physician.  No radiographic interpretation.   CT HIP LEFT WO CONTRAST Result Date: 02/10/2023 CLINICAL DATA:  Fall with left hip pain EXAM: CT OF THE LEFT HIP WITHOUT CONTRAST TECHNIQUE: Multidetector CT imaging of the left hip was performed according to the standard protocol. Multiplanar CT image reconstructions were also generated. RADIATION DOSE REDUCTION: This exam was performed according to the departmental dose-optimization program which includes automated exposure control, adjustment of the mA and/or kV according to patient size and/or use of iterative reconstruction technique. COMPARISON:  02/10/2023 FINDINGS: Bones/Joint/Cartilage Acute mildly impacted and comminuted sub capital femoral neck fracture with mild apex anterior angulation. No femoral head dislocation. Pubic symphysis and rami appear intact. No SI joint widening. Ligaments Suboptimally assessed by CT. Muscles and Tendons No intramuscular fluid collections.  No significant atrophy Soft tissues Soft tissue edema about the left hip. Vascular calcifications in the pelvis. IMPRESSION: Acute mildly impacted and comminuted subcapital femoral neck fracture with mild apex anterior and medial angulation. Electronically Signed   By: Jasmine Pang M.D.   On: 02/10/2023 21:23   DG Pelvis 1-2 Views Result Date: 02/10/2023 CLINICAL DATA:  Fall EXAM: PELVIS - 1-2 VIEW COMPARISON:  CT 12/13/2022 FINDINGS: Acute left femoral neck fracture. No femoral head dislocation. Pubic symphysis and rami appear intact. Mild to moderate hip degenerative changes. Vascular calcifications  IMPRESSION: Acute left femoral neck fracture. Electronically Signed   By: Jasmine Pang M.D.   On: 02/10/2023 19:56   DG Chest 1 View Result Date: 02/10/2023 CLINICAL DATA:  Fall with hip pain EXAM: CHEST  1 VIEW COMPARISON:  12/27/2022 FINDINGS: Hardware in the cervical spine. Low lung volumes. Chronic interstitial opacities. Linear areas of atelectasis or possible scarring at the lung bases. Stable cardiomediastinal silhouette with aortic atherosclerosis. No pneumothorax IMPRESSION: Low lung volumes with chronic interstitial opacities and linear areas of atelectasis or possible scarring at the lung bases. Electronically Signed   By: Jasmine Pang M.D.   On: 02/10/2023 19:55   DG Knee 1-2 Views Left Result Date: 02/10/2023 CLINICAL DATA:  Fall with pain EXAM: LEFT KNEE - 1-2 VIEW COMPARISON:  None Available. FINDINGS: Limited  by positioning. Sclerosis in the distal shaft and metaphysis of the femur suggestive of an infarct. No acute fracture or malalignment. No sizeable effusion IMPRESSION: 1. No acute osseous abnormality. Electronically Signed   By: Jasmine Pang M.D.   On: 02/10/2023 19:54   DG FEMUR MIN 2 VIEWS LEFT Result Date: 02/10/2023 CLINICAL DATA:  Fall left hip and sacral pain EXAM: LEFT FEMUR 2 VIEWS COMPARISON:  None Available. FINDINGS: Sclerosis in the distal shaft of the femur which may be due to infarct. Acute left femoral neck fracture. No femoral head dislocation. Vascular calcifications. IMPRESSION: Acute left femoral neck fracture. Electronically Signed   By: Jasmine Pang M.D.   On: 02/10/2023 19:53   CT HEAD WO CONTRAST Result Date: 02/10/2023 CLINICAL DATA:  Head and neck trauma from fall EXAM: CT HEAD WITHOUT CONTRAST CT CERVICAL SPINE WITHOUT CONTRAST TECHNIQUE: Multidetector CT imaging of the head and cervical spine was performed following the standard protocol without intravenous contrast. Multiplanar CT image reconstructions of the cervical spine were also generated.  RADIATION DOSE REDUCTION: This exam was performed according to the departmental dose-optimization program which includes automated exposure control, adjustment of the mA and/or kV according to patient size and/or use of iterative reconstruction technique. COMPARISON:  CT and MRI 12/28/2022 and CT 11/15/2017 FINDINGS: CT HEAD FINDINGS Brain: No intracranial hemorrhage, mass effect, or evidence of acute infarct. No hydrocephalus. No extra-axial fluid collection. Age related cerebral atrophy and chronic small vessel ischemic disease. Chronic right occipital infarct. Vascular: No hyperdense vessel. Intracranial arterial calcification. Skull: No fracture or focal lesion. Sinuses/Orbits: No acute finding. Other: None. CT CERVICAL SPINE FINDINGS Alignment: No evidence of traumatic malalignment. Skull base and vertebrae: No acute fracture. No primary bone lesion or focal pathologic process. Soft tissues and spinal canal: No prevertebral fluid or swelling. No visible canal hematoma. Disc levels: Unchanged multilevel spondylosis greatest at C5-C6. ACDF C6-C7. Mild spinal canal narrowing at C3-C4, C5-C6 and C6-C7 secondary to posterior disc osteophyte complexes. No severe spinal canal narrowing. Multilevel facet arthropathy. Upper chest: No acute abnormality. Other: Carotid calcification. IMPRESSION: 1. No acute intracranial abnormality. 2. No acute fracture in the cervical spine. Electronically Signed   By: Minerva Fester M.D.   On: 02/10/2023 19:47   CT CERVICAL SPINE WO CONTRAST Result Date: 02/10/2023 CLINICAL DATA:  Head and neck trauma from fall EXAM: CT HEAD WITHOUT CONTRAST CT CERVICAL SPINE WITHOUT CONTRAST TECHNIQUE: Multidetector CT imaging of the head and cervical spine was performed following the standard protocol without intravenous contrast. Multiplanar CT image reconstructions of the cervical spine were also generated. RADIATION DOSE REDUCTION: This exam was performed according to the departmental  dose-optimization program which includes automated exposure control, adjustment of the mA and/or kV according to patient size and/or use of iterative reconstruction technique. COMPARISON:  CT and MRI 12/28/2022 and CT 11/15/2017 FINDINGS: CT HEAD FINDINGS Brain: No intracranial hemorrhage, mass effect, or evidence of acute infarct. No hydrocephalus. No extra-axial fluid collection. Age related cerebral atrophy and chronic small vessel ischemic disease. Chronic right occipital infarct. Vascular: No hyperdense vessel. Intracranial arterial calcification. Skull: No fracture or focal lesion. Sinuses/Orbits: No acute finding. Other: None. CT CERVICAL SPINE FINDINGS Alignment: No evidence of traumatic malalignment. Skull base and vertebrae: No acute fracture. No primary bone lesion or focal pathologic process. Soft tissues and spinal canal: No prevertebral fluid or swelling. No visible canal hematoma. Disc levels: Unchanged multilevel spondylosis greatest at C5-C6. ACDF C6-C7. Mild spinal canal narrowing at C3-C4, C5-C6 and C6-C7 secondary to  posterior disc osteophyte complexes. No severe spinal canal narrowing. Multilevel facet arthropathy. Upper chest: No acute abnormality. Other: Carotid calcification. IMPRESSION: 1. No acute intracranial abnormality. 2. No acute fracture in the cervical spine. Electronically Signed   By: Minerva Fester M.D.   On: 02/10/2023 19:47    Microbiology: Results for orders placed or performed during the hospital encounter of 02/10/23  Surgical pcr screen     Status: None   Collection Time: 02/11/23 11:40 AM   Specimen: Nasal Mucosa; Nasal Swab  Result Value Ref Range Status   MRSA, PCR NEGATIVE NEGATIVE Final   Staphylococcus aureus NEGATIVE NEGATIVE Final    Comment: (NOTE) The Xpert SA Assay (FDA approved for NASAL specimens in patients 83 years of age and older), is one component of a comprehensive surveillance program. It is not intended to diagnose infection nor to guide  or monitor treatment. Performed at [facility], [address], [place], [place] [address]   Urine Culture     Status: Abnormal   Collection Time: 02/13/23  5:30 PM   Specimen: Urine, Clean Catch  Result Value Ref Range Status   Specimen Description URINE, CLEAN CATCH  Final   Special Requests   Final    NONE Performed at [facility], [address], [place], [place] [address]    Culture >=100,000 COLONIES/mL YEAST (A)  Final   Report Status 02/15/2023 FINAL  Final    Labs: CBC: Recent Labs  Lab 02/10/23 1728 02/11/23 0419 02/12/23 8295 02/13/23 0549 02/15/23 0509 02/16/23 0542 02/17/23 0636  WBC 6.1 7.4 13.5* 13.6* 10.6* 9.0 6.0  NEUTROABS 3.9 6.0  --   --   --   --   --   HGB 8.9* 8.7* 7.0* 8.3* 7.6* 8.3* 8.8*  HCT 27.4* 27.0* 21.3* 25.9* 23.8* 25.6* 27.4*  MCV 96.5 97.5 97.7 97.4 96.0 96.2 96.8  PLT 233 223 184 230 255 254 278   Basic Metabolic Panel: Recent Labs  Lab 02/12/23 0621 02/13/23 0549 02/15/23 0509 02/16/23 0542 02/17/23 0636  NA 138 139 139 138 135  K 4.6 4.2 3.6 3.6 3.5  CL 109 107 107 108 104  CO2 20* 20* 19* 18* 18*  GLUCOSE 265* 202* 164* 195* 176*  BUN 31* 35* 40* 41* 40*  CREATININE 2.46* 2.50* 2.61* 2.47* 2.53*  CALCIUM 7.9* 8.5* 8.4* 8.2* 8.1*   Liver Function Tests: No results for input(s): "AST", "ALT", "ALKPHOS", "BILITOT", "PROT", "ALBUMIN" in the last 168 hours. CBG: Recent Labs  Lab 02/16/23 0912 02/16/23 1104 02/16/23 1625 02/16/23 2140 02/17/23 0601  GLUCAP 192* 96 210* 107* 167*    Discharge time spent: greater than 30 minutes.  Signed: Coralie Keens, MD [facility] 02/17/2023

## 2023-02-18 DIAGNOSIS — R2681 Unsteadiness on feet: Secondary | ICD-10-CM | POA: Diagnosis not present

## 2023-02-18 DIAGNOSIS — R2689 Other abnormalities of gait and mobility: Secondary | ICD-10-CM | POA: Diagnosis not present

## 2023-02-18 DIAGNOSIS — F32A Depression, unspecified: Secondary | ICD-10-CM | POA: Diagnosis not present

## 2023-02-18 DIAGNOSIS — N189 Chronic kidney disease, unspecified: Secondary | ICD-10-CM | POA: Diagnosis not present

## 2023-02-18 DIAGNOSIS — M6281 Muscle weakness (generalized): Secondary | ICD-10-CM | POA: Diagnosis not present

## 2023-02-18 DIAGNOSIS — Z471 Aftercare following joint replacement surgery: Secondary | ICD-10-CM | POA: Diagnosis not present

## 2023-02-18 DIAGNOSIS — I639 Cerebral infarction, unspecified: Secondary | ICD-10-CM | POA: Diagnosis not present

## 2023-02-18 DIAGNOSIS — W19XXXA Unspecified fall, initial encounter: Secondary | ICD-10-CM | POA: Diagnosis not present

## 2023-02-18 DIAGNOSIS — Z9181 History of falling: Secondary | ICD-10-CM | POA: Diagnosis not present

## 2023-02-18 DIAGNOSIS — S72002A Fracture of unspecified part of neck of left femur, initial encounter for closed fracture: Secondary | ICD-10-CM | POA: Diagnosis not present

## 2023-02-18 DIAGNOSIS — S72012A Unspecified intracapsular fracture of left femur, initial encounter for closed fracture: Secondary | ICD-10-CM | POA: Diagnosis not present

## 2023-02-18 DIAGNOSIS — E119 Type 2 diabetes mellitus without complications: Secondary | ICD-10-CM | POA: Diagnosis not present

## 2023-02-18 DIAGNOSIS — F419 Anxiety disorder, unspecified: Secondary | ICD-10-CM | POA: Diagnosis not present

## 2023-02-18 DIAGNOSIS — Z96642 Presence of left artificial hip joint: Secondary | ICD-10-CM | POA: Diagnosis not present

## 2023-02-18 DIAGNOSIS — R1310 Dysphagia, unspecified: Secondary | ICD-10-CM | POA: Diagnosis not present

## 2023-02-18 DIAGNOSIS — N390 Urinary tract infection, site not specified: Secondary | ICD-10-CM | POA: Diagnosis not present

## 2023-02-18 DIAGNOSIS — S72092D Other fracture of head and neck of left femur, subsequent encounter for closed fracture with routine healing: Secondary | ICD-10-CM | POA: Diagnosis not present

## 2023-02-18 DIAGNOSIS — N184 Chronic kidney disease, stage 4 (severe): Secondary | ICD-10-CM | POA: Diagnosis not present

## 2023-02-18 DIAGNOSIS — D649 Anemia, unspecified: Secondary | ICD-10-CM | POA: Diagnosis not present

## 2023-02-18 DIAGNOSIS — F411 Generalized anxiety disorder: Secondary | ICD-10-CM | POA: Diagnosis not present

## 2023-02-18 DIAGNOSIS — I69398 Other sequelae of cerebral infarction: Secondary | ICD-10-CM | POA: Diagnosis not present

## 2023-02-18 DIAGNOSIS — R41841 Cognitive communication deficit: Secondary | ICD-10-CM | POA: Diagnosis not present

## 2023-02-18 LAB — GLUCOSE, CAPILLARY
Glucose-Capillary: 224 mg/dL — ABNORMAL HIGH (ref 70–99)
Glucose-Capillary: 178 mg/dL — ABNORMAL HIGH (ref 70–99)
Glucose-Capillary: 120 mg/dL — ABNORMAL HIGH (ref 70–99)

## 2023-02-18 MED ORDER — LOPERAMIDE HCL 2 MG PO CAPS: 2.0000 mg | ORAL_CAPSULE | ORAL | Status: DC | PRN

## 2023-02-18 MED ORDER — METOPROLOL SUCCINATE ER 25 MG PO TB24: 25.0000 mg | ORAL_TABLET | Freq: Every day | ORAL | Status: DC

## 2023-02-18 MED ORDER — FLUCONAZOLE 100 MG PO TABS: 100.0000 mg | ORAL_TABLET | Freq: Every day | ORAL | Status: AC

## 2023-02-18 NOTE — Discharge Summary (Signed)
Physician Discharge Summary   Patient: Sandra [name] MRN: [id number] DOB: 03/12/1933  Admit date:     02/10/2023  Discharge date: 02/18/23  Discharge Physician: Thad Ranger, MD    PCP: [name], NP   Recommendations at discharge:    Patient will continue fluconazole for cystitis UTi for 12 more days Continue aspirin 81 mg bid for 2 weeks for DVT prophylaxis  Follow CBC, BMET in 1 week Follow up with Richarda Blade NP in 7 to 10 days.  Follow up with orthopedics in 2 weeks   Discharge Diagnoses:    Subcapital fracture of hip, left, closed, initial encounter (HCC)   UTI (urinary tract infection)   Anxiety state   History of CVA (cerebrovascular accident)   Essential hypertension   Type 2 diabetes mellitus with hyperlipidemia (HCC)   CKD (chronic kidney disease) stage 4, GFR 15-29 ml/min (HCC) Diarrhea-improved Acute metabolic encephalopathy, postop delirium, resolved   Hospital Course:   89 yo female with the past medical history of hypertension, T2DM, dyslipidemia, anxiety, history of CVA and CKD who presented after a mechanical fall.  She had frequent hospitalizations over the lat 2 months, giving patient persistent weakness. On the day of admission she sustained a mechanical fall while trying to get to the bathroom. Patient complained of severe left hip pain after the fall. EMS was called and patient was transported to the ED.  On her initial physical examination her blood pressure was 150/94, HR 78, RR 18 and 02 saturation 98% on room air.  Lungs with no wheezing or rhonchi, no rales, heart with S1 and S2 present and regular with no gallops, rubs or murmurs, abdomen with no distention, left lower extremity was flexed and drawn, no edema.    Na 136, K 4,3 CL 104 bicarbonate 24, glucose 327 bun 29 cr 2,30  Wbc 6.1 hgb 8,9 plt 233  Urine analysis SG 1,017, protein 100, leukocytes moderate, wbc >50    CT cervical spine and head with no acute intracranial abnormalities,  and no acute fracture in the cervical spine.    CT left hip with acute mildly impacted and comminuted subcapital femoral neck fracture with mild apex anterior and medial angulation.    Chest radiograph with hypoinflation with no infiltrates or effusions.    Left femur fracture with acute left femoral neck fracture.    EKG 76 bpm, normal axis, normal intervals, sinus rhythm with no significant ST segment or T wave changes.    Orthopedics was consulted 01/23 Left direct anterior hip hemiarthroplasty  01/26 unwitnessed fall in the hospital with small fracture in the greater trochanter area.  01/30: plan to transfer to SNF to continue PT and OT.    Assessment and Plan:  Subcapital fracture of hip, left, closed, initial encounter (HCC) -Underwent left direct anterior hip hemiarthroplasty on 02/11/23. - She had a fall in the hospital with small fracture in the greater trochanteric area of the hip, this is small and minimal. Orthopedics were re consulted, with recommendations of : she can still be up with assistance and full weightbearing on that left hip but only needs to be up with assistance. Therapy should work on balance and mobility and no hip abduction and no hip strengthening for the short-term.    She can be up but only with assistance using a walker and with people assisting her with full weightbearing as tolerated on that left operative hip.  -Outpatient follow-up with Dr. Magnus Ivan in 2 weeks   Mild post  operative anemia, combined with chronic anemia. -Stable, hemoglobin 8.8 at discharge   Post operative acute metabolic encephalopathy with delirium.  Patient was placed on supportive medical therapy, encephalopathy has resolved.    UTI (urinary tract infection) Urine cultures with 100, 000 CFU yeast.  -Continue fluconazole for 12 more days  -Patient was placed on IV ceftriaxone during her hospitalization  Anxiety state Continue with citalopram and clonazepam.     History of  CVA (cerebrovascular accident) Continue statin, Plavix.      Essential hypertension -Continue hydralazine 25 mg 3 times daily, Toprol-XL 25 mg daily (can be discontinued as daughter has not been consistently giving her at home.)   Type 2 diabetes mellitus with hyperlipidemia (HCC) Patient was placed on insulin therapy for glucose cover and monitoring.  -Resume outpatient sliding scale insulin regimen   CKD (chronic kidney disease) stage 4, GFR 15-29 ml/min (HCC) -Creatinine within baseline, 2.3-3.5. -Creatinine 2.5 at discharge. Outpatient follow-up with nephrology  Diarrhea -Likely due to stool softeners, discontinued.  Diarrhea is improving, had only 1 BM this morning. Loperamide as needed     Pain control - [employer] Controlled Substance Reporting System database was reviewed. and patient was instructed, not to drive, operate heavy machinery, perform activities at heights, swimming or participation in water activities or provide baby-sitting services while on Pain, Sleep and Anxiety Medications; until their outpatient Physician has advised to do so again. Also recommended to not to take more than prescribed Pain, Sleep and Anxiety Medications.  Consultants: Orthopedics Procedures performed: Left direct anterior hip hemiarthroplasty   Disposition: Skilled nursing facility Diet recommendation:  Discharge Diet Orders (From admission, onward)     Start     Ordered   02/17/23 0000  Diet - low sodium heart healthy        02/17/23 1055            DISCHARGE MEDICATION: Allergies as of 02/18/2023       Reactions   Cephalexin Nausea And Vomiting   Pt stated made severely sick, will never take again   Pioglitazone Swelling   REACTION: EDEMA   Nintedanib Diarrhea   Sertraline Nausea And Vomiting        Medication List     STOP taking these medications    furosemide 20 MG tablet Commonly known as: LASIX   hydrOXYzine 10 MG tablet Commonly known as: ATARAX    potassium chloride SA 20 MEQ tablet Commonly known as: KLOR-CON M   sodium bicarbonate 650 MG tablet       TAKE these medications    acetaminophen 500 MG tablet Commonly known as: TYLENOL Take 500 mg by mouth every 6 (six) hours as needed for mild pain or headache.   aspirin EC 81 MG tablet Take 1 tablet (81 mg total) by mouth 2 (two) times daily for 14 days. Swallow whole.   atorvastatin 80 MG tablet Commonly known as: LIPITOR TAKE 1 TABLET EVERY DAY What changed: when to take this   citalopram 10 MG tablet Commonly known as: CELEXA TAKE 1 TABLET BY MOUTH EVERY DAY What changed:  when to take this reasons to take this   clonazePAM 0.5 MG tablet Commonly known as: KLONOPIN Take 1 tablet (0.5 mg total) by mouth 2 (two) times daily as needed for anxiety.   clopidogrel 75 MG tablet Commonly known as: PLAVIX TAKE 1 TABLET EVERY DAY   famotidine 40 MG tablet Commonly known as: PEPCID TAKE 1 TABLET EVERY DAY   feeding supplement Liqd  Take 237 mLs by mouth 2 (two) times daily between meals.   fluconazole 100 MG tablet Commonly known as: DIFLUCAN Take 1 tablet (100 mg total) by mouth daily for 12 days.   hydrALAZINE 25 MG tablet Commonly known as: APRESOLINE Take 1 tablet (25 mg total) by mouth 3 (three) times daily.   loperamide 2 MG capsule Commonly known as: IMODIUM Take 1 capsule (2 mg total) by mouth as needed for diarrhea or loose stools.   metoprolol succinate 25 MG 24 hr tablet Commonly known as: TOPROL-XL Take 1 tablet (25 mg total) by mouth daily. Take with or immediately following a meal. What changed:  medication strength how much to take   NovoLOG FlexPen 100 UNIT/ML FlexPen Generic drug: insulin aspart Inject 6-8 Units into the skin 3 (three) times daily with meals as needed for high blood sugar. Pt completes sliding scale.   polyethylene glycol 17 g packet Commonly known as: MIRALAX / GLYCOLAX Take 17 g by mouth daily.                Durable Medical Equipment  (From admission, onward)           Start     Ordered   02/11/23 1530  DME 3 n 1  Once        02/11/23 1529   02/11/23 1530  DME Walker rolling  Once       Question Answer Comment  Walker: With 5 Inch Wheels   Patient needs a walker to treat with the following condition Status post hip hemiarthroplasty      02/11/23 1529            Follow-up Information     Kathryne Hitch, MD. Schedule an appointment as soon as possible for a visit in 2 week(s).   Specialty: Orthopedic Surgery Contact information: [address] [place] [address] [phone]                Discharge Exam: Ceasar Mons Weights   02/11/23 1051  Weight: 51 kg   S: No acute complaints except left leg pain.  Wants to go to rehab.  BP (!) 154/46 (BP Location: Left Arm)   Pulse 76   Temp 97.7 F (36.5 C)   Resp 18   Ht 5' (1.524 m)   Wt 51 kg   SpO2 100%   BMI 21.96 kg/m    Physical Exam General: Alert and oriented x 3, NAD Cardiovascular: S1 S2 clear, RRR.  Respiratory: CTAB, no wheezing Gastrointestinal: Soft, nontender, nondistended, NBS Ext: no pedal edema bilaterally Neuro: no new deficits Psych: Normal affect    Condition at discharge: fair  The results of significant diagnostics from this hospitalization (including imaging, microbiology, ancillary and laboratory) are listed below for reference.   Imaging Studies: CT HEAD WO CONTRAST ( ) Result Date: 02/14/2023 CLINICAL DATA:  Neck trauma (Age >= 65y); Head trauma, focal neuro findings (Age 10-64y) EXAM: CT HEAD WITHOUT CONTRAST CT CERVICAL SPINE WITHOUT CONTRAST TECHNIQUE: Multidetector CT imaging of the head and cervical spine was performed following the standard protocol without intravenous contrast. Multiplanar CT image reconstructions of the cervical spine were also generated. RADIATION DOSE REDUCTION: This exam was performed according to the departmental dose-optimization  program which includes automated exposure control, adjustment of the mA and/or kV according to patient size and/or use of iterative reconstruction technique. COMPARISON:  CT head and C-spine 02/10/2023 FINDINGS: CT HEAD FINDINGS Brain: Cerebral ventricle sizes are concordant with the degree of cerebral  volume loss. Patchy and confluent areas of decreased attenuation are noted throughout the deep and periventricular white matter of the cerebral hemispheres bilaterally, compatible with chronic microvascular ischemic disease. Similar-appearing right parieto-occipital encephalomalacia. No evidence of large-territorial acute infarction. No parenchymal hemorrhage. No mass lesion. No extra-axial collection. No mass effect or midline shift. No hydrocephalus. Basilar cisterns are patent. Vascular: No hyperdense vessel. Atherosclerotic calcifications are present within the cavernous internal carotid arteries. Skull: No acute fracture or focal lesion. Sinuses/Orbits: Paranasal sinuses and mastoid air cells are clear. The orbits are unremarkable. Other: Left 11 mm parieto-occipital scalp hematoma formation. CT CERVICAL SPINE FINDINGS Alignment: Stable grade 1 anterolisthesis of C7 on T1. Skull base and vertebrae: C6-C7 anterior cervical discectomy and fusion surgical hardware. Multilevel moderate degenerative changes of the spine. Moderate severe osseous neural foraminal stenosis bilaterally at the C6-C7 level, left greater than right. No acute fracture. No aggressive appearing focal osseous lesion or focal pathologic process. Soft tissues and spinal canal: No prevertebral fluid or swelling. No visible canal hematoma. Upper chest: Unremarkable. Other: Atherosclerotic plaque of the aortic arch and its branches. IMPRESSION: 1. No acute intracranial abnormality. 2. No acute displaced fracture or traumatic listhesis of the cervical spine. 3.  Aortic Atherosclerosis (ICD10-I70.0). Electronically Signed   By: Tish Frederickson M.D.    On: 02/14/2023 18:06   CT CERVICAL SPINE WO CONTRAST Result Date: 02/14/2023 CLINICAL DATA:  Neck trauma (Age >= 65y); Head trauma, focal neuro findings (Age 64-64y) EXAM: CT HEAD WITHOUT CONTRAST CT CERVICAL SPINE WITHOUT CONTRAST TECHNIQUE: Multidetector CT imaging of the head and cervical spine was performed following the standard protocol without intravenous contrast. Multiplanar CT image reconstructions of the cervical spine were also generated. RADIATION DOSE REDUCTION: This exam was performed according to the departmental dose-optimization program which includes automated exposure control, adjustment of the mA and/or kV according to patient size and/or use of iterative reconstruction technique. COMPARISON:  CT head and C-spine 02/10/2023 FINDINGS: CT HEAD FINDINGS Brain: Cerebral ventricle sizes are concordant with the degree of cerebral volume loss. Patchy and confluent areas of decreased attenuation are noted throughout the deep and periventricular white matter of the cerebral hemispheres bilaterally, compatible with chronic microvascular ischemic disease. Similar-appearing right parieto-occipital encephalomalacia. No evidence of large-territorial acute infarction. No parenchymal hemorrhage. No mass lesion. No extra-axial collection. No mass effect or midline shift. No hydrocephalus. Basilar cisterns are patent. Vascular: No hyperdense vessel. Atherosclerotic calcifications are present within the cavernous internal carotid arteries. Skull: No acute fracture or focal lesion. Sinuses/Orbits: Paranasal sinuses and mastoid air cells are clear. The orbits are unremarkable. Other: Left 11 mm parieto-occipital scalp hematoma formation. CT CERVICAL SPINE FINDINGS Alignment: Stable grade 1 anterolisthesis of C7 on T1. Skull base and vertebrae: C6-C7 anterior cervical discectomy and fusion surgical hardware. Multilevel moderate degenerative changes of the spine. Moderate severe osseous neural foraminal stenosis  bilaterally at the C6-C7 level, left greater than right. No acute fracture. No aggressive appearing focal osseous lesion or focal pathologic process. Soft tissues and spinal canal: No prevertebral fluid or swelling. No visible canal hematoma. Upper chest: Unremarkable. Other: Atherosclerotic plaque of the aortic arch and its branches. IMPRESSION: 1. No acute intracranial abnormality. 2. No acute displaced fracture or traumatic listhesis of the cervical spine. 3.  Aortic Atherosclerosis (ICD10-I70.0). Electronically Signed   By: Tish Frederickson M.D.   On: 02/14/2023 18:06   CT HIP LEFT WO CONTRAST Result Date: 02/14/2023 CLINICAL DATA:  Fall with injury after recent repair EXAM: CT OF THE LEFT HIP  WITHOUT CONTRAST TECHNIQUE: Multidetector CT imaging of the left hip was performed according to the standard protocol. Multiplanar CT image reconstructions were also generated. RADIATION DOSE REDUCTION: This exam was performed according to the departmental dose-optimization program which includes automated exposure control, adjustment of the mA and/or kV according to patient size and/or use of iterative reconstruction technique. COMPARISON:  X-ray intraoperative left hip 02/11/2023, x-ray pelvis 02/10/2023 FINDINGS: Bones/Joint/Cartilage Total left hip arthroplasty with periprosthetic minimally displaced acute intertrochanteric fracture. Ligaments Suboptimally assessed by CT. Muscles and Tendons Subcutaneus soft tissue edema and emphysema along the left hip and proximal thigh soft tissues and musculature likely postsurgical in etiology. Soft tissues Subcutaneus soft tissue edema and emphysema along the left hip and proximal thigh soft tissues and musculature likely postsurgical in etiology. Left gluteal, hip, proximal thigh hematoma formation along the posterolateral aspect. Overlying skin staples. Other: Atherosclerotic plaque. IMPRESSION: 1. Total left hip arthroplasty with periprosthetic minimally displaced acute  intertrochanteric fracture. 2. Subcutaneus soft tissue edema and emphysema along the left hip and proximal thigh soft tissues and musculature likely postsurgical in etiology. Differential diagnosis includes infection. Electronically Signed   By: Tish Frederickson M.D.   On: 02/14/2023 17:53   DG HIP UNILAT WITH PELVIS 1V LEFT Result Date: 02/11/2023 CLINICAL DATA:  Elective surgery. EXAM: DG HIP (WITH OR WITHOUT PELVIS) 1V*L* COMPARISON:  Preoperative imaging FINDINGS: Three fluoroscopic spot views of the pelvis and left hip obtained in the operating room. Images during hip arthroplasty. Fluoroscopy time 12 seconds. Dose 0.636 mGy. IMPRESSION: Procedural fluoroscopy during left hip arthroplasty. Electronically Signed   By: Narda Rutherford M.D.   On: 02/11/2023 13:29   DG C-Arm 1-60 Min-No Report Result Date: 02/11/2023 Fluoroscopy was utilized by the requesting physician.  No radiographic interpretation.   CT HIP LEFT WO CONTRAST Result Date: 02/10/2023 CLINICAL DATA:  Fall with left hip pain EXAM: CT OF THE LEFT HIP WITHOUT CONTRAST TECHNIQUE: Multidetector CT imaging of the left hip was performed according to the standard protocol. Multiplanar CT image reconstructions were also generated. RADIATION DOSE REDUCTION: This exam was performed according to the departmental dose-optimization program which includes automated exposure control, adjustment of the mA and/or kV according to patient size and/or use of iterative reconstruction technique. COMPARISON:  02/10/2023 FINDINGS: Bones/Joint/Cartilage Acute mildly impacted and comminuted sub capital femoral neck fracture with mild apex anterior angulation. No femoral head dislocation. Pubic symphysis and rami appear intact. No SI joint widening. Ligaments Suboptimally assessed by CT. Muscles and Tendons No intramuscular fluid collections.  No significant atrophy Soft tissues Soft tissue edema about the left hip. Vascular calcifications in the pelvis. IMPRESSION:  Acute mildly impacted and comminuted subcapital femoral neck fracture with mild apex anterior and medial angulation. Electronically Signed   By: Jasmine Pang M.D.   On: 02/10/2023 21:23   DG Pelvis 1-2 Views Result Date: 02/10/2023 CLINICAL DATA:  Fall EXAM: PELVIS - 1-2 VIEW COMPARISON:  CT 12/13/2022 FINDINGS: Acute left femoral neck fracture. No femoral head dislocation. Pubic symphysis and rami appear intact. Mild to moderate hip degenerative changes. Vascular calcifications IMPRESSION: Acute left femoral neck fracture. Electronically Signed   By: Jasmine Pang M.D.   On: 02/10/2023 19:56   DG Chest 1 View Result Date: 02/10/2023 CLINICAL DATA:  Fall with hip pain EXAM: CHEST  1 VIEW COMPARISON:  12/27/2022 FINDINGS: Hardware in the cervical spine. Low lung volumes. Chronic interstitial opacities. Linear areas of atelectasis or possible scarring at the lung bases. Stable cardiomediastinal silhouette with aortic atherosclerosis. No  pneumothorax IMPRESSION: Low lung volumes with chronic interstitial opacities and linear areas of atelectasis or possible scarring at the lung bases. Electronically Signed   By: Jasmine Pang M.D.   On: 02/10/2023 19:55   DG Knee 1-2 Views Left Result Date: 02/10/2023 CLINICAL DATA:  Fall with pain EXAM: LEFT KNEE - 1-2 VIEW COMPARISON:  None Available. FINDINGS: Limited by positioning. Sclerosis in the distal shaft and metaphysis of the femur suggestive of an infarct. No acute fracture or malalignment. No sizeable effusion IMPRESSION: 1. No acute osseous abnormality. Electronically Signed   By: Jasmine Pang M.D.   On: 02/10/2023 19:54   DG FEMUR MIN 2 VIEWS LEFT Result Date: 02/10/2023 CLINICAL DATA:  Fall left hip and sacral pain EXAM: LEFT FEMUR 2 VIEWS COMPARISON:  None Available. FINDINGS: Sclerosis in the distal shaft of the femur which may be due to infarct. Acute left femoral neck fracture. No femoral head dislocation. Vascular calcifications. IMPRESSION: Acute  left femoral neck fracture. Electronically Signed   By: Jasmine Pang M.D.   On: 02/10/2023 19:53   CT HEAD WO CONTRAST Result Date: 02/10/2023 CLINICAL DATA:  Head and neck trauma from fall EXAM: CT HEAD WITHOUT CONTRAST CT CERVICAL SPINE WITHOUT CONTRAST TECHNIQUE: Multidetector CT imaging of the head and cervical spine was performed following the standard protocol without intravenous contrast. Multiplanar CT image reconstructions of the cervical spine were also generated. RADIATION DOSE REDUCTION: This exam was performed according to the departmental dose-optimization program which includes automated exposure control, adjustment of the mA and/or kV according to patient size and/or use of iterative reconstruction technique. COMPARISON:  CT and MRI 12/28/2022 and CT 11/15/2017 FINDINGS: CT HEAD FINDINGS Brain: No intracranial hemorrhage, mass effect, or evidence of acute infarct. No hydrocephalus. No extra-axial fluid collection. Age related cerebral atrophy and chronic small vessel ischemic disease. Chronic right occipital infarct. Vascular: No hyperdense vessel. Intracranial arterial calcification. Skull: No fracture or focal lesion. Sinuses/Orbits: No acute finding. Other: None. CT CERVICAL SPINE FINDINGS Alignment: No evidence of traumatic malalignment. Skull base and vertebrae: No acute fracture. No primary bone lesion or focal pathologic process. Soft tissues and spinal canal: No prevertebral fluid or swelling. No visible canal hematoma. Disc levels: Unchanged multilevel spondylosis greatest at C5-C6. ACDF C6-C7. Mild spinal canal narrowing at C3-C4, C5-C6 and C6-C7 secondary to posterior disc osteophyte complexes. No severe spinal canal narrowing. Multilevel facet arthropathy. Upper chest: No acute abnormality. Other: Carotid calcification. IMPRESSION: 1. No acute intracranial abnormality. 2. No acute fracture in the cervical spine. Electronically Signed   By: Minerva Fester M.D.   On: 02/10/2023 19:47    CT CERVICAL SPINE WO CONTRAST Result Date: 02/10/2023 CLINICAL DATA:  Head and neck trauma from fall EXAM: CT HEAD WITHOUT CONTRAST CT CERVICAL SPINE WITHOUT CONTRAST TECHNIQUE: Multidetector CT imaging of the head and cervical spine was performed following the standard protocol without intravenous contrast. Multiplanar CT image reconstructions of the cervical spine were also generated. RADIATION DOSE REDUCTION: This exam was performed according to the departmental dose-optimization program which includes automated exposure control, adjustment of the mA and/or kV according to patient size and/or use of iterative reconstruction technique. COMPARISON:  CT and MRI 12/28/2022 and CT 11/15/2017 FINDINGS: CT HEAD FINDINGS Brain: No intracranial hemorrhage, mass effect, or evidence of acute infarct. No hydrocephalus. No extra-axial fluid collection. Age related cerebral atrophy and chronic small vessel ischemic disease. Chronic right occipital infarct. Vascular: No hyperdense vessel. Intracranial arterial calcification. Skull: No fracture or focal lesion. Sinuses/Orbits: No acute  finding. Other: None. CT CERVICAL SPINE FINDINGS Alignment: No evidence of traumatic malalignment. Skull base and vertebrae: No acute fracture. No primary bone lesion or focal pathologic process. Soft tissues and spinal canal: No prevertebral fluid or swelling. No visible canal hematoma. Disc levels: Unchanged multilevel spondylosis greatest at C5-C6. ACDF C6-C7. Mild spinal canal narrowing at C3-C4, C5-C6 and C6-C7 secondary to posterior disc osteophyte complexes. No severe spinal canal narrowing. Multilevel facet arthropathy. Upper chest: No acute abnormality. Other: Carotid calcification. IMPRESSION: 1. No acute intracranial abnormality. 2. No acute fracture in the cervical spine. Electronically Signed   By: Minerva Fester M.D.   On: 02/10/2023 19:47    Microbiology: Results for orders placed or performed during the hospital encounter  of 02/10/23  Surgical pcr screen     Status: None   Collection Time: 02/11/23 11:40 AM   Specimen: Nasal Mucosa; Nasal Swab  Result Value Ref Range Status   MRSA, PCR NEGATIVE NEGATIVE Final   Staphylococcus aureus NEGATIVE NEGATIVE Final    Comment: (NOTE) The Xpert SA Assay (FDA approved for NASAL specimens in patients 60 years of age and older), is one component of a comprehensive surveillance program. It is not intended to diagnose infection nor to guide or monitor treatment. Performed at [facility], [address] [place], [place] [address]   Urine Culture     Status: Abnormal   Collection Time: 02/13/23  5:30 PM   Specimen: Urine, Clean Catch  Result Value Ref Range Status   Specimen Description URINE, CLEAN CATCH  Final   Special Requests   Final    NONE Performed at [facility], [address] [place], [place] [address]    Culture >=100,000 COLONIES/mL YEAST (A)  Final   Report Status 02/15/2023 FINAL  Final    Labs: CBC: Recent Labs  Lab 02/12/23 0621 02/13/23 0549 02/15/23 0509 02/16/23 0542 02/17/23 0636  WBC 13.5* 13.6* 10.6* 9.0 6.0  HGB 7.0* 8.3* 7.6* 8.3* 8.8*  HCT 21.3* 25.9* 23.8* 25.6* 27.4*  MCV 97.7 97.4 96.0 96.2 96.8  PLT 184 230 255 254 278   Basic Metabolic Panel: Recent Labs  Lab 02/12/23 0621 02/13/23 0549 02/15/23 0509 02/16/23 0542 02/17/23 0636  NA 138 139 139 138 135  K 4.6 4.2 3.6 3.6 3.5  CL 109 107 107 108 104  CO2 20* 20* 19* 18* 18*  GLUCOSE 265* 202* 164* 195* 176*  BUN 31* 35* 40* 41* 40*  CREATININE 2.46* 2.50* 2.61* 2.47* 2.53*  CALCIUM 7.9* 8.5* 8.4* 8.2* 8.1*   Liver Function Tests: No results for input(s): "AST", "ALT", "ALKPHOS", "BILITOT", "PROT", "ALBUMIN" in the last 168 hours. CBG: Recent Labs  Lab 02/17/23 1128 02/17/23 1629 02/17/23 2100 02/18/23 0621 02/18/23 1132  GLUCAP 150* 244* 184* 224* 178*    Discharge time spent: greater than 30 minutes.  Signed: Thad Ranger,  MD [facility] 02/18/2023

## 2023-02-18 NOTE — Progress Notes (Signed)
Physical Therapy Treatment Patient Details Name: Sandra [name] MRN: [id number] DOB: 1933-04-30 Today's Date: 02/18/2023   History of Present Illness 89 y.o. female presents to [facility] 02/10/23 after falling w/ L subcapital neck fx. S/p L anterior direct hip hemiarthroplasty 1/23. Patient had another unwitnessed fall in the hospital, resulting in a small fx of the greater trochanter.  Multiple prior hospital visits since 11/24 for diarrhea and acute on CKD. PMH includes: DM2, diabetic retinopathy, essential HTN, CKD, TIA, depression    PT Comments  Pt is slowly progressing towards goals. Currently pt is CGA for bed mobility,  sit to stand and gait. Pt requires CGA for transfers from EOB <> BSC. Pt remains a high risk for falls due to poor safety awareness and impaired balance due to pain/weakness in the L hip. Due to pt current functional status, home set up and available assistance at home recommending skilled physical therapy services < 3 hours/day in order to address strength, balance and functional mobility to decrease risk for falls, injury, immobility, skin break down and re-hospitalization.     If plan is discharge home, recommend the following: Assistance with cooking/housework;Assist for transportation;Help with stairs or ramp for entrance;A little help with walking and/or transfers   Can travel by private vehicle     No  Equipment Recommendations  Other (comment) (defer to post acute)       Precautions / Restrictions Precautions Precautions: Fall Precaution Booklet Issued: No Precaution Comments: anterior hip Restrictions Weight Bearing Restrictions Per Provider Order: Yes LLE Weight Bearing Per Provider Order: Weight bearing as tolerated     Mobility  Bed Mobility Overal bed mobility: Needs Assistance Bed Mobility: Rolling, Supine to Sit, Sit to Supine Rolling: Supervision   Supine to sit: Contact guard Sit to supine: Contact guard assist   General bed mobility comments:  CGA w/ increased time and HOB elevated with use of bed rails.    Transfers Overall transfer level: Needs assistance Equipment used: Rolling walker (2 wheels) Transfers: Sit to/from Stand Sit to Stand: Contact guard assist   Step pivot transfers: Contact guard assist       General transfer comment: pt with cues for safety and assist navigating AD    Ambulation/Gait Ambulation/Gait assistance: Contact guard assist Gait Distance (Feet): 50 Feet Assistive device: Rolling walker (2 wheels) Gait Pattern/deviations: Decreased stride length, Step-to pattern Gait velocity: Decreased Gait velocity interpretation: <1.31 ft/sec, indicative of household ambulator   General Gait Details: cues for activity pacing, shortened step to gait pattern. Increased time. Verbal cues for proximity to AD        Balance Overall balance assessment: Needs assistance Sitting-balance support: Bilateral upper extremity supported, Feet supported Sitting balance-Leahy Scale: Good     Standing balance support: Bilateral upper extremity supported, During functional activity, Reliant on assistive device for balance Standing balance-Leahy Scale: Fair Standing balance comment: no overt LOB.        Cognition Arousal: Alert Behavior During Therapy: WFL for tasks assessed/performed Overall Cognitive Status: Impaired/Different from baseline Area of Impairment: Memory       Memory: Decreased recall of precautions, Decreased short-term memory         General Comments: PT stated that she got up this morning by herself and used the bedside commode then emptied it out and cleaned it. Pt was in bed with bed alarm on.           General Comments General comments (skin integrity, edema, etc.): Pt daughter present throughout session. Pt had BM  and urinated in [facility] during session. Pt was able to stand and perform self care for cleaning peri area with Min A for balance.      Pertinent Vitals/Pain Pain  Assessment Pain Assessment: No/denies pain Pain Intervention(s): Monitored during session     PT Goals (current goals can now be found in the care plan section) Acute Rehab PT Goals Patient Stated Goal: to get better PT Goal Formulation: With patient Time For Goal Achievement: 02/26/23 Potential to Achieve Goals: Good Progress towards PT goals: Progressing toward goals    Frequency    Min 1X/week      PT Plan  Continue with current POC        AM-PAC PT "6 Clicks" Mobility   Outcome Measure  Help needed turning from your back to your side while in a flat bed without using bedrails?: A Little Help needed moving from lying on your back to sitting on the side of a flat bed without using bedrails?: A Little Help needed moving to and from a bed to a chair (including a wheelchair)?: A Little Help needed standing up from a chair using your arms (e.g., wheelchair or bedside chair)?: A Little Help needed to walk in hospital room?: A Little Help needed climbing 3-5 steps with a railing? : A Lot 6 Click Score: 17    End of Session Equipment Utilized During Treatment: Gait belt Activity Tolerance: Patient tolerated treatment well Patient left: in bed;with call bell/phone within reach;with bed alarm set;with family/visitor present Nurse Communication: Mobility status PT Visit Diagnosis: Unsteadiness on feet (R26.81);History of falling (Z91.81);Muscle weakness (generalized) (M62.81)     Time: 1610-9604 PT Time Calculation (min) (ACUTE ONLY): 23 min  Charges:    $Therapeutic Activity: 23-37 mins PT General Charges $$ ACUTE PT VISIT: 1 Visit                     Harrel Carina, DPT, CLT  Acute Rehabilitation Services Office: [phone] (Secure chat preferred)    Claudia Desanctis 02/18/2023, 11:51 AM

## 2023-02-18 NOTE — TOC Transition Note (Signed)
Transition of Care [facility]) - Discharge Note   Patient Details  Name: Sandra [name] MRN: [id number] Date of Birth: 02-Mar-1933  [facility]) CM/SW Contact:  Baldemar Lenis, LCSW Phone Number: 02/18/2023, 12:54 PM   Clinical Narrative:   CSW coordinated with MD and RN, patient medically stable for discharge to SNF. CSW confirmed with [facility] that bed is available. CSW sent discharge information, spoke with daughter, [name], and nephew/POA Valentina Shaggy, both in agreement. Family will transport. No further TOC needs.  Nurse to call report to [phone].    Final next level of care: Skilled Nursing Facility Barriers to Discharge: Barriers Resolved   Patient Goals and CMS Choice Patient states their goals for this hospitalization and ongoing recovery are:: Pt dispriented at this time, unable to participate in goal setting. CMS Medicare.gov Compare Post Acute Care list provided to:: Patient Represenative (must comment) Choice offered to / list presented to : Adult Children Lucila Maine)      Discharge Placement              Patient chooses bed at: [facility] Patient to be transferred to facility by: Family Name of family member notified: Emmit Alexanders Patient and family notified of of transfer: 02/18/23  Discharge Plan and Services Additional resources added to the After Visit Summary for   In-house Referral: Clinical Social Work   Post Acute Care Choice: Skilled Nursing Facility                               Social Drivers of Health (SDOH) Interventions SDOH Screenings   Food Insecurity: No Food Insecurity (02/11/2023)  Recent Concern: Food Insecurity - Food Insecurity Present (12/19/2022)  Housing: Low Risk  (02/11/2023)  Transportation Needs: No Transportation Needs (02/11/2023)  Utilities: Not At Risk (02/11/2023)  Depression (PHQ2-9): Low Risk  (01/19/2023)  Social Connections: Socially Isolated (02/11/2023)  Tobacco Use: Medium Risk  (02/11/2023)     Readmission Risk Interventions    12/21/2022   12:26 PM  Readmission Risk Prevention Plan  Transportation Screening Complete  PCP or Specialist Appt within 3-5 Days Complete  HRI or Home Care Consult Complete  Social Work Consult for Recovery Care Planning/Counseling Complete  Palliative Care Screening Not Applicable  Medication Review Oceanographer) Complete

## 2023-02-18 NOTE — TOC Progression Note (Signed)
Transition of Care [facility]) - Progression Note    Patient Details  Name: Sandra [name] MRN: [id number] Date of Birth: Mar 18, 1933  Transition [facility] Care [facility]) CM/SW Contact  Lorri Frederick, LCSW Phone Number: 02/18/2023, 8:38 AM  Clinical Narrative:   SNF auth request approved: [id number], [id number], 5 days: 1/30-2/3.    Expected Discharge Plan: Skilled Nursing Facility Barriers to Discharge: Continued Medical Work up, SNF Pending bed offer, English as a second language teacher  Expected Discharge Plan and Services In-house Referral: Clinical Social Work   Post Acute Care Choice: Skilled Nursing Facility Living arrangements for the past 2 months: Single Family Home Expected Discharge Date: 02/17/23                                     Social Determinants of Health (SDOH) Interventions SDOH Screenings   Food Insecurity: No Food Insecurity (02/11/2023)  Recent Concern: Food Insecurity - Food Insecurity Present (12/19/2022)  Housing: Low Risk  (02/11/2023)  Transportation Needs: No Transportation Needs (02/11/2023)  Utilities: Not At Risk (02/11/2023)  Depression (PHQ2-9): Low Risk  (01/19/2023)  Social Connections: Socially Isolated (02/11/2023)  Tobacco Use: Medium Risk (02/11/2023)    Readmission Risk Interventions    12/21/2022   12:26 PM  Readmission Risk Prevention Plan  Transportation Screening Complete  PCP or Specialist Appt within 3-5 Days Complete  HRI or Home Care Consult Complete  Social Work Consult for Recovery Care Planning/Counseling Complete  Palliative Care Screening Not Applicable  Medication Review Oceanographer) Complete

## 2023-02-22 DIAGNOSIS — R2689 Other abnormalities of gait and mobility: Secondary | ICD-10-CM | POA: Diagnosis not present

## 2023-02-22 DIAGNOSIS — F32A Depression, unspecified: Secondary | ICD-10-CM | POA: Diagnosis not present

## 2023-02-22 DIAGNOSIS — M6281 Muscle weakness (generalized): Secondary | ICD-10-CM | POA: Diagnosis not present

## 2023-02-22 DIAGNOSIS — F411 Generalized anxiety disorder: Secondary | ICD-10-CM | POA: Diagnosis not present

## 2023-02-22 DIAGNOSIS — Z96642 Presence of left artificial hip joint: Secondary | ICD-10-CM | POA: Diagnosis not present

## 2023-02-22 DIAGNOSIS — Z9181 History of falling: Secondary | ICD-10-CM | POA: Diagnosis not present

## 2023-02-22 DIAGNOSIS — S72092D Other fracture of head and neck of left femur, subsequent encounter for closed fracture with routine healing: Secondary | ICD-10-CM | POA: Diagnosis not present

## 2023-02-24 ENCOUNTER — Ambulatory Visit: Payer: Medicare HMO | Admitting: Family

## 2023-02-24 DIAGNOSIS — D649 Anemia, unspecified: Secondary | ICD-10-CM | POA: Diagnosis not present

## 2023-02-24 DIAGNOSIS — I639 Cerebral infarction, unspecified: Secondary | ICD-10-CM | POA: Diagnosis not present

## 2023-02-24 DIAGNOSIS — F419 Anxiety disorder, unspecified: Secondary | ICD-10-CM | POA: Diagnosis not present

## 2023-02-24 DIAGNOSIS — N184 Chronic kidney disease, stage 4 (severe): Secondary | ICD-10-CM | POA: Diagnosis not present

## 2023-02-24 DIAGNOSIS — E119 Type 2 diabetes mellitus without complications: Secondary | ICD-10-CM | POA: Diagnosis not present

## 2023-02-24 DIAGNOSIS — N390 Urinary tract infection, site not specified: Secondary | ICD-10-CM | POA: Diagnosis not present

## 2023-02-24 DIAGNOSIS — Z471 Aftercare following joint replacement surgery: Secondary | ICD-10-CM | POA: Diagnosis not present

## 2023-02-25 ENCOUNTER — Encounter: Payer: Self-pay | Admitting: Physician Assistant

## 2023-02-25 ENCOUNTER — Other Ambulatory Visit: Payer: Self-pay

## 2023-02-25 ENCOUNTER — Ambulatory Visit: Payer: Medicare HMO | Admitting: Physician Assistant

## 2023-02-25 DIAGNOSIS — F32A Depression, unspecified: Secondary | ICD-10-CM | POA: Diagnosis not present

## 2023-02-25 DIAGNOSIS — M6281 Muscle weakness (generalized): Secondary | ICD-10-CM | POA: Diagnosis not present

## 2023-02-25 DIAGNOSIS — F411 Generalized anxiety disorder: Secondary | ICD-10-CM | POA: Diagnosis not present

## 2023-02-25 DIAGNOSIS — Z96649 Presence of unspecified artificial hip joint: Secondary | ICD-10-CM

## 2023-02-25 DIAGNOSIS — S72092D Other fracture of head and neck of left femur, subsequent encounter for closed fracture with routine healing: Secondary | ICD-10-CM | POA: Diagnosis not present

## 2023-02-25 DIAGNOSIS — R2689 Other abnormalities of gait and mobility: Secondary | ICD-10-CM | POA: Diagnosis not present

## 2023-02-25 DIAGNOSIS — Z96642 Presence of left artificial hip joint: Secondary | ICD-10-CM | POA: Diagnosis not present

## 2023-02-25 DIAGNOSIS — Z9181 History of falling: Secondary | ICD-10-CM | POA: Diagnosis not present

## 2023-02-25 NOTE — Progress Notes (Signed)
 HPI: Sandra [name] comes in today status post left hip hemiarthroplasty.  When she sustained a left femoral neck fracture.  Postoperative she did sustain a nondisplaced intertrochanteric periprosthetic fracture.  She is currently in a skilled nursing facility.  Reports she is walking well with rolling walker.  Reports pain to be 2 out of 10 pain at worst.  She is on chronic Plavix .  She is taking Tylenol  only for pain.  Review of systems see HPI otherwise negative  Physical exam: General Well-developed well-nourished female no acute distress.  Affect appropriate. Left hip surgical incisions healing well no signs of infection Staples well-approximated the incision.  Left calf supple nontender dorsiflexion plantarflexion ankle intact.  Left hip good range of motion without significant pain.  AP pelvis and lateral view of the left hip: Bilateral hips well located.  The left hip hemiarthroplasty appears well-seated.  No acute fractures.  There is some sclerotic activity off the lateral trochanter seen on lateral left hip radiograph.  Impression: Status post left hip hemiarthroplasty  Plan: She will continue to work with the physical therapy to work on gait balance and strengthening.  She is weightbearing as tolerated.  Staples were removed Steri-Strips applied.  She is able to get the incision wet in the shower.  Questions were encouraged and answered both the patient and her daughters present throughout the exam.  She will follow-up with us  on an as-needed basis or if there is any concerns about wound healing problems.

## 2023-02-26 DIAGNOSIS — Z96642 Presence of left artificial hip joint: Secondary | ICD-10-CM | POA: Diagnosis not present

## 2023-02-26 DIAGNOSIS — Z471 Aftercare following joint replacement surgery: Secondary | ICD-10-CM | POA: Diagnosis not present

## 2023-02-26 DIAGNOSIS — E119 Type 2 diabetes mellitus without complications: Secondary | ICD-10-CM | POA: Diagnosis not present

## 2023-02-26 DIAGNOSIS — F419 Anxiety disorder, unspecified: Secondary | ICD-10-CM | POA: Diagnosis not present

## 2023-02-27 ENCOUNTER — Other Ambulatory Visit: Payer: Self-pay | Admitting: Family

## 2023-02-27 DIAGNOSIS — L299 Pruritus, unspecified: Secondary | ICD-10-CM

## 2023-03-01 ENCOUNTER — Telehealth: Payer: Self-pay | Admitting: Adult Health

## 2023-03-01 NOTE — Telephone Encounter (Signed)
 Pt's daughter, Roark Chick rescheduled EEG due to scheduling conflict. Have fracture hip and have a appointment with PCP.

## 2023-03-02 ENCOUNTER — Other Ambulatory Visit: Payer: Medicare HMO | Admitting: *Deleted

## 2023-03-02 ENCOUNTER — Telehealth: Payer: Self-pay

## 2023-03-02 NOTE — Telephone Encounter (Signed)
Noted.patient has appointment 03/03/2023.

## 2023-03-02 NOTE — Telephone Encounter (Signed)
[facility] received orders form [employer] for PT/OT, however patient is not able to start until 03/10/23  [facility]

## 2023-03-03 ENCOUNTER — Encounter: Payer: Self-pay | Admitting: Family

## 2023-03-03 ENCOUNTER — Ambulatory Visit (INDEPENDENT_AMBULATORY_CARE_PROVIDER_SITE_OTHER): Payer: Medicare HMO | Admitting: Family

## 2023-03-03 VITALS — BP 110/60 | HR 54 | Temp 97.6°F | Resp 21 | Ht 60.0 in | Wt 110.8 lb

## 2023-03-03 DIAGNOSIS — I10 Essential (primary) hypertension: Secondary | ICD-10-CM | POA: Diagnosis not present

## 2023-03-03 DIAGNOSIS — R051 Acute cough: Secondary | ICD-10-CM | POA: Diagnosis not present

## 2023-03-03 DIAGNOSIS — N184 Chronic kidney disease, stage 4 (severe): Secondary | ICD-10-CM | POA: Diagnosis not present

## 2023-03-03 DIAGNOSIS — Z794 Long term (current) use of insulin: Secondary | ICD-10-CM | POA: Diagnosis not present

## 2023-03-03 DIAGNOSIS — E1122 Type 2 diabetes mellitus with diabetic chronic kidney disease: Secondary | ICD-10-CM | POA: Diagnosis not present

## 2023-03-03 DIAGNOSIS — F32A Depression, unspecified: Secondary | ICD-10-CM

## 2023-03-03 DIAGNOSIS — F411 Generalized anxiety disorder: Secondary | ICD-10-CM

## 2023-03-03 MED ORDER — GUAIFENESIN ER 600 MG PO TB12: 600.0000 mg | ORAL_TABLET | Freq: Two times a day (BID) | ORAL | 0 refills | Status: AC

## 2023-03-03 MED ORDER — CLONAZEPAM 0.5 MG PO TABS: 0.5000 mg | ORAL_TABLET | Freq: Two times a day (BID) | ORAL | 0 refills | Status: DC | PRN

## 2023-03-03 MED ORDER — CLONAZEPAM 0.5 MG PO TABS: 0.5000 mg | ORAL_TABLET | Freq: Two times a day (BID) | ORAL | 3 refills | Status: DC | PRN

## 2023-03-03 MED ORDER — CITALOPRAM HYDROBROMIDE 10 MG PO TABS: 10.0000 mg | ORAL_TABLET | Freq: Every day | ORAL | 2 refills | Status: DC

## 2023-03-03 NOTE — Progress Notes (Unsigned)
Provider: Richarda Blade FNP-C   Masahiro Iglesia, Donalee Citrin, NP  Patient Care Team: Rylie Knierim, Donalee Citrin, NP as PCP - General (Family Medicine) Quintella Reichert, MD as PCP - Cardiology (Cardiology) Micki Riley, MD as Consulting Physician (Neurology) Mateo Flow, MD as Consulting Physician (Ophthalmology) Meryl Dare, MD (Inactive) as Consulting Physician (Gastroenterology) Jonelle Sidle, MD as Consulting Physician (Cardiology) Meribeth Mattes, PA-C as Physician Assistant (Endocrinology) Delton See, MD (Inactive) as Consulting Physician (Rehabilitation) Terrial Rhodes, MD as Consulting Physician (Nephrology) Donzetta Starch, MD as Consulting Physician (Dermatology)  Extended Emergency Contact Information Primary Emergency Contact: Lianne Moris [place] of [place] Home Phone: [phone] Relation: Daughter Secondary Emergency Contact: [facility] Phone: [phone] Relation: Son Interpreter needed? No  Code Status:  Full code Goals of care: Advanced Directive information    03/03/2023   12:19 PM  Advanced Directives  Does Patient Have a Medical Advance Directive? Yes  Type of Estate agent of [place];Living will;Out of facility DNR (pink MOST or yellow form)  Does patient want to make changes to medical advance directive? No - Patient declined  Copy of Healthcare Power of Attorney in Chart? Yes - validated most recent copy scanned in chart (See row information)  Would patient like information on creating a medical advance directive? No - Patient declined     Chief Complaint  Patient presents with   Medical Management of Chronic Issues    Follow up.  Has concern about cough and would like to check her hip.Discuss the need foreye exam, tdap, AWV and covid     Discussed the use of AI scribe software for clinical note transcription with the patient, who gave verbal consent to proceed.  History of Present Illness   Sandra [name] is  an 89 year old female who presents for follow-up after rehabilitation for a left hip fracture. She is accompanied by her caregiver.  She was hospitalized on January 22nd for a left hip fracture and discharged on January 30th, after which she went directly to a rehabilitation facility. She was discharged from rehab this past Saturday. During her rehabilitation, she has been using a walker and her knee has been hurting, which she attributes to arthritis. She is concerned about the possibility of needing a knee replacement, especially since it is the same leg as her fractured hip.  Since her discharge from rehab, she has been experiencing respiratory symptoms, including trouble breathing, nasal congestion, and a cough with mucus production. The cough has not improved significantly but is slightly better than before. She experiences wheezing, particularly in the mornings. No recent testing for COVID or flu has been done.  She has been out of clonazepam, which she takes for anxiety, and has not had any for several days, leading to increased stress and anxiety. She is also on citalopram for depression but has not been taking it due to a recent antibiotic course for a urinary tract infection, which was contraindicated with her depression medication. She is supposed to take citalopram once daily in the morning.  Her current medications include atorvastatin for cholesterol, clopidogrel (Plavix), famotidine for acid reflux, metoprolol for blood pressure, and Novolog for blood sugar management. She is not taking Ensure as she dislikes it. She has been prescribed fluconazole for a fungal infection and has been using Tylenol for pain management. She inquires about the use of Imodium, as she is concerned about its effects if she has colitis.   Past Medical History:  Diagnosis Date  Achilles tendon injury 11/27/2010   ALLERGIC RHINITIS 05/12/2006   Qualifier: Diagnosis of  By: Drue Novel MD, Nolon Rod.    Anxiety state  07/03/2009   Qualifier: Diagnosis of  By: Drue Novel MD, Jose E.    Back pain 07/01/2010   Chronic kidney disease (CKD), stage III (moderate) (HCC) 05/31/2014   Chronic right SI joint pain 09/22/2013   Colitis 2024   DEGENERATIVE JOINT DISEASE, CERVICAL SPINE 06/23/2006   Annotation: had a CAT scan with a cervical myelogram that show  left C6-7 and  C5-6 foraminal stenosis Qualifier: Diagnosis of  By: Drue Novel MD, Nolon Rod.    DEPRESSION 05/12/2006   Qualifier: Diagnosis of  By: Drue Novel MD, Jose E.    DIABETES MELLITUS, TYPE II 05/12/2006   Now following w/ Endo at [facility]     DIABETIC  RETINOPATHY 04/27/2007   Qualifier: Diagnosis of  By: Janit Bern     Encephalopathy, hypertensive    Essential hypertension 05/12/2006   Qualifier: Diagnosis of  By: Drue Novel MD, Nolon Rod.    GAIT DISTURBANCE 08/07/2009   Qualifier: Diagnosis of  By: Drue Novel MD, Nolon Rod.    GERD (gastroesophageal reflux disease)    History of colonoscopy    History of CT scan    History of mammogram    History of MRI    Hyperlipidemia associated with type 2 diabetes mellitus (HCC) 05/12/2006   Qualifier: Diagnosis of  By: Drue Novel MD, Jose E.    ILD (interstitial lung disease) (HCC) 05/31/2015   Lobar cerebral hemorrhage (HCC) 06/22/2019   NECK PAIN, CHRONIC 04/03/2008   Qualifier: Diagnosis of  By: Drue Novel MD, Nolon Rod.    Osteoarthritis 02/10/2016   Osteopenia    Osteoporosis 06/23/2006   Annotation: had a bone density test in 08-2004.  T score was -2.4 Qualifier: Diagnosis of  By: Drue Novel MD, Nolon Rod     [facility] (subarachnoid hemorrhage) (HCC)    Scleroderma (HCC) 02/28/2016   Stroke [facility])    Takotsubo cardiomyopathy    s/p stress MI with stress induced CM with normal coronary arteries by cath 2007 with normalization of LVF by echo 04/2005   TIA (transient ischemic attack)    Vasculitis of skin 09/28/2012   Vitamin D deficiency 10/01/2014   Past Surgical History:  Procedure Laterality Date   ABDOMINAL HYSTERECTOMY  01/20/1975   no oophorectomy    ANTERIOR APPROACH HEMI HIP ARTHROPLASTY Left 02/11/2023   Procedure: ANTERIOR APPROACH HEMI HIP ARTHROPLASTY;  Surgeon: Kathryne Hitch, MD;  Location: [facility] OR;  Service: Orthopedics;  Laterality: Left;   APPENDECTOMY     CARDIAC CATHETERIZATION     CATARACT EXTRACTION, BILATERAL  01/19/2009   FRACTURE SURGERY     SPINAL FUSION  01/19/1998   Dr. Venetia Maxon   TONSILLECTOMY      Allergies  Allergen Reactions   Cephalexin Nausea And Vomiting    Pt stated made severely sick, will never take again   Pioglitazone Swelling    REACTION: EDEMA   Nintedanib Diarrhea   Sertraline Nausea And Vomiting    Allergies as of 03/03/2023       Reactions   Cephalexin Nausea And Vomiting   Pt stated made severely sick, will never take again   Pioglitazone Swelling   REACTION: EDEMA   Nintedanib Diarrhea   Sertraline Nausea And Vomiting        Medication List        Accurate as of March 03, 2023  1:10 PM. If you have any  questions, ask your nurse or doctor.          STOP taking these medications    feeding supplement Liqd Stopped by: Ronnita Paz C Aira Sallade   polyethylene glycol 17 g packet Commonly known as: MIRALAX / GLYCOLAX Stopped by: Janequa Kipnis C Mckynzi Cammon       TAKE these medications    acetaminophen 500 MG tablet Commonly known as: TYLENOL Take 500 mg by mouth every 6 (six) hours as needed for mild pain (pain score 1-3) or headache.   aspirin EC 81 MG tablet Take 1 tablet (81 mg total) by mouth 2 (two) times daily for 14 days. Swallow whole.   atorvastatin 80 MG tablet Commonly known as: LIPITOR TAKE 1 TABLET EVERY DAY What changed: when to take this   citalopram 10 MG tablet Commonly known as: CELEXA Take 1 tablet (10 mg total) by mouth daily. What changed:  when to take this reasons to take this   clonazePAM 0.5 MG tablet Commonly known as: KLONOPIN Take 1 tablet (0.5 mg total) by mouth 2 (two) times daily as needed for anxiety.   clopidogrel 75 MG  tablet Commonly known as: PLAVIX TAKE 1 TABLET EVERY DAY   famotidine 40 MG tablet Commonly known as: PEPCID TAKE 1 TABLET EVERY DAY   hydrALAZINE 25 MG tablet Commonly known as: APRESOLINE Take 1 tablet (25 mg total) by mouth 3 (three) times daily.   loperamide 2 MG capsule Commonly known as: IMODIUM Take 1 capsule (2 mg total) by mouth as needed for diarrhea or loose stools.   metoprolol succinate 25 MG 24 hr tablet Commonly known as: TOPROL-XL Take 1 tablet (25 mg total) by mouth daily. Take with or immediately following a meal.   NovoLOG FlexPen 100 UNIT/ML FlexPen Generic drug: insulin aspart Inject 6-8 Units into the skin 3 (three) times daily with meals as needed for high blood sugar. Pt completes sliding scale.        Review of Systems  Constitutional:  Negative for appetite change, chills, fatigue, fever and unexpected weight change.  HENT:  Negative for congestion, ear discharge, ear pain, hearing loss, nosebleeds, postnasal drip, rhinorrhea, sinus pressure, sinus pain, sneezing, sore throat, tinnitus and trouble swallowing.   Eyes:  Negative for pain, discharge, redness, itching and visual disturbance.  Respiratory:  Positive for cough. Negative for chest tightness, shortness of breath and wheezing.   Cardiovascular:  Negative for chest pain, palpitations and leg swelling.  Gastrointestinal:  Negative for abdominal distention, abdominal pain, blood in stool, constipation, diarrhea, nausea and vomiting.  Endocrine: Negative for cold intolerance, heat intolerance, polydipsia, polyphagia and polyuria.  Genitourinary:  Negative for difficulty urinating, dysuria, flank pain, frequency and urgency.       Incontinent wears adult pull ups   Musculoskeletal:  Positive for arthralgias and gait problem. Negative for back pain, joint swelling, myalgias, neck pain and neck stiffness.  Skin:  Negative for color change, pallor, rash and wound.       Left hip surgical incision   Neurological:  Negative for dizziness, syncope, speech difficulty, weakness, light-headedness, numbness and headaches.  Hematological:  Does not bruise/bleed easily.  Psychiatric/Behavioral:  Negative for agitation, behavioral problems, confusion, hallucinations, self-injury, sleep disturbance and suicidal ideas. The patient is nervous/anxious.        Worsening depression off Celexa which was stopped while on antibiotics    Immunization History  Administered Date(s) Administered   Fluad Quad(high Dose 65+) 10/20/2018   Fluad Trivalent(High Dose 65+) 11/30/2022   H1N1  01/05/2008   Influenza Split 10/22/2009, 12/09/2010, 10/24/2013, 10/01/2014, 09/14/2016   Influenza Whole 12/27/2006, 10/18/2007, 01/05/2008, 12/19/2008, 10/22/2009   Influenza, High Dose Seasonal PF 12/16/2015, 10/27/2017, 10/20/2018   Influenza,inj,Quad PF,6+ Mos 10/24/2013, 10/01/2014, 09/14/2016   Influenza,inj,quad, With Preservative 11/23/2016   Influenza-Unspecified 12/27/2006, 01/05/2008, 12/19/2008, 12/09/2010, 11/30/2011, 10/19/2012, 10/20/2018, 10/03/2019, 10/08/2020, 09/30/2021   Novel Infuenza-h1n1-09 01/05/2008   PFIZER(Purple Top)SARS-COV-2 Vaccination 05/01/2019, 05/22/2019, 11/28/2019   Pneumococcal Conjugate-13 11/02/2013   Pneumococcal Polysaccharide-23 11/19/2009   Td 06/18/2008   Td (Adult),5 Lf Tetanus Toxid, Preservative Free 06/18/2008   Zoster, Live 05/03/2007, 11/03/2007   Pertinent  Health Maintenance Due  Topic Date Due   OPHTHALMOLOGY EXAM  07/29/2017   HEMOGLOBIN A1C  05/30/2023   FOOT EXAM  06/02/2023   INFLUENZA VACCINE  Completed   DEXA SCAN  Completed   MAMMOGRAM  Discontinued      09/01/2022    1:50 PM 01/01/2023    1:37 PM 01/19/2023    2:16 PM 01/28/2023    1:18 PM 03/03/2023   12:18 PM  Fall Risk  Falls in the past year? 0 0 1 0 1  Was there an injury with Fall? 0 0 0 0 1  Fall Risk Category Calculator 0 0 2 0 2  Patient at Risk for Falls Due to No Fall Risks No Fall Risks   History of fall(s) Impaired balance/gait;Impaired mobility  Fall risk Follow up Falls evaluation completed Education provided  Falls evaluation completed Falls evaluation completed   Functional Status Survey:    Vitals:   03/03/23 1208  BP: 110/60  Pulse: (!) 54  Resp: (!) 21  Temp: 97.6 F (36.4 C)  SpO2: 92%  Weight: 110 lb 12.8 oz (50.3 kg)  Height: 5' (1.524 m)   Body mass index is 21.64 kg/m. Physical Exam Vitals reviewed.  Constitutional:      General: She is not in acute distress.    Appearance: Normal appearance. She is normal weight. She is not ill-appearing or diaphoretic.  HENT:     Head: Normocephalic.     Right Ear: Tympanic membrane, ear canal and external ear normal. There is no impacted cerumen.     Left Ear: Tympanic membrane, ear canal and external ear normal. There is no impacted cerumen.     Nose: Nose normal. No congestion or rhinorrhea.     Mouth/Throat:     Mouth: Mucous membranes are moist.     Pharynx: Oropharynx is clear. No oropharyngeal exudate or posterior oropharyngeal erythema.  Eyes:     General: No scleral icterus.       Right eye: No discharge.        Left eye: No discharge.     Extraocular Movements: Extraocular movements intact.     Conjunctiva/sclera: Conjunctivae normal.     Pupils: Pupils are equal, round, and reactive to light.  Neck:     Vascular: No carotid bruit.  Cardiovascular:     Rate and Rhythm: Normal rate and regular rhythm.     Pulses: Normal pulses.     Heart sounds: Normal heart sounds. No murmur heard.    No friction rub. No gallop.  Pulmonary:     Effort: Pulmonary effort is normal. No respiratory distress.     Breath sounds: Normal breath sounds. No wheezing, rhonchi or rales.  Chest:     Chest wall: No tenderness.  Abdominal:     General: Bowel sounds are normal. There is no distension.     Palpations: Abdomen is soft.  There is no mass.     Tenderness: There is no abdominal tenderness. There is no right CVA  tenderness, left CVA tenderness, guarding or rebound.  Musculoskeletal:        General: No swelling or tenderness. Normal range of motion.     Cervical back: Normal range of motion. No rigidity or tenderness.     Right lower leg: No edema.     Left lower leg: No edema.  Lymphadenopathy:     Cervical: No cervical adenopathy.  Skin:    General: Skin is warm and dry.     Coloration: Skin is not pale.     Findings: No bruising, erythema, lesion or rash.     Comments: Left hip surgical incision healed without any erythema or drainage.12 O'clock position steri-strips intact.  Neurological:     Mental Status: She is alert and oriented to person, place, and time.     Cranial Nerves: No cranial nerve deficit.     Sensory: No sensory deficit.     Motor: No weakness.     Coordination: Coordination normal.     Gait: Gait abnormal.  Psychiatric:        Mood and Affect: Mood normal.        Speech: Speech normal.        Behavior: Behavior normal.        Thought Content: Thought content normal.        Judgment: Judgment normal.     Labs reviewed: Recent Labs    12/20/22 0530 12/21/22 0750 12/28/22 0704 12/29/22 0452 02/15/23 0509 02/16/23 0542 02/17/23 0636  NA 141   < > 140   < > 139 138 135  K 4.3   < > 4.0   < > 3.6 3.6 3.5  CL 119*   < > 106   < > 107 108 104  CO2 16*   < > 25   < > 19* 18* 18*  GLUCOSE 177*   < > 153*   < > 164* 195* 176*  BUN 53*   < > 29*   < > 40* 41* 40*  CREATININE 4.21*   < > 3.42*   < > 2.61* 2.47* 2.53*  CALCIUM 7.9*   < > 7.9*   < > 8.4* 8.2* 8.1*  MG  --   --  1.3*  --   --   --   --   PHOS 5.7*  --   --   --   --   --   --    < > = values in this interval not displayed.   Recent Labs    12/13/22 1323 12/20/22 0530 12/28/22 0704 12/29/22 0452 01/01/23 1418 01/19/23 1455  AST 20  --  18 18 14 15   ALT 15  --  14 13 10 8   ALKPHOS 73  --  52 51  --   --   BILITOT 0.6  --  0.5 0.5 0.5 0.5  PROT 6.4*  --  5.5* 5.5* 6.3 6.4  ALBUMIN 3.2* 2.7*  2.7* 2.6*  --   --    Recent Labs    01/19/23 1455 02/10/23 1728 02/11/23 0419 02/12/23 0621 02/15/23 0509 02/16/23 0542 02/17/23 0636  WBC 10.8 6.1 7.4   < > 10.6* 9.0 6.0  NEUTROABS 8,478* 3.9 6.0  --   --   --   --   HGB 8.8* 8.9* 8.7*   < > 7.6* 8.3* 8.8*  HCT 27.4* 27.4*  27.0*   < > 23.8* 25.6* 27.4*  MCV 97.2 96.5 97.5   < > 96.0 96.2 96.8  PLT 344 233 223   < > 255 254 278   < > = values in this interval not displayed.   Lab Results  Component Value Date   TSH 3.50 06/08/2022   Lab Results  Component Value Date   HGBA1C 8.2 (H) 11/30/2022   Lab Results  Component Value Date   CHOL 95 12/29/2022   HDL 35 (L) 12/29/2022   LDLCALC 37 12/29/2022   LDLDIRECT 110.0 10/13/2016   TRIG 116 12/29/2022   CHOLHDL 2.7 12/29/2022    Significant Diagnostic Results in last 30 days:  CT HEAD WO CONTRAST ( ) Result Date: 02/14/2023 CLINICAL DATA:  Neck trauma (Age >= 65y); Head trauma, focal neuro findings (Age 22-64y) EXAM: CT HEAD WITHOUT CONTRAST CT CERVICAL SPINE WITHOUT CONTRAST TECHNIQUE: Multidetector CT imaging of the head and cervical spine was performed following the standard protocol without intravenous contrast. Multiplanar CT image reconstructions of the cervical spine were also generated. RADIATION DOSE REDUCTION: This exam was performed according to the departmental dose-optimization program which includes automated exposure control, adjustment of the mA and/or kV according to patient size and/or use of iterative reconstruction technique. COMPARISON:  CT head and C-spine 02/10/2023 FINDINGS: CT HEAD FINDINGS Brain: Cerebral ventricle sizes are concordant with the degree of cerebral volume loss. Patchy and confluent areas of decreased attenuation are noted throughout the deep and periventricular white matter of the cerebral hemispheres bilaterally, compatible with chronic microvascular ischemic disease. Similar-appearing right parieto-occipital encephalomalacia. No  evidence of large-territorial acute infarction. No parenchymal hemorrhage. No mass lesion. No extra-axial collection. No mass effect or midline shift. No hydrocephalus. Basilar cisterns are patent. Vascular: No hyperdense vessel. Atherosclerotic calcifications are present within the cavernous internal carotid arteries. Skull: No acute fracture or focal lesion. Sinuses/Orbits: Paranasal sinuses and mastoid air cells are clear. The orbits are unremarkable. Other: Left 11 mm parieto-occipital scalp hematoma formation. CT CERVICAL SPINE FINDINGS Alignment: Stable grade 1 anterolisthesis of C7 on T1. Skull base and vertebrae: C6-C7 anterior cervical discectomy and fusion surgical hardware. Multilevel moderate degenerative changes of the spine. Moderate severe osseous neural foraminal stenosis bilaterally at the C6-C7 level, left greater than right. No acute fracture. No aggressive appearing focal osseous lesion or focal pathologic process. Soft tissues and spinal canal: No prevertebral fluid or swelling. No visible canal hematoma. Upper chest: Unremarkable. Other: Atherosclerotic plaque of the aortic arch and its branches. IMPRESSION: 1. No acute intracranial abnormality. 2. No acute displaced fracture or traumatic listhesis of the cervical spine. 3.  Aortic Atherosclerosis (ICD10-I70.0). Electronically Signed   By: Tish Frederickson M.D.   On: 02/14/2023 18:06   CT CERVICAL SPINE WO CONTRAST Result Date: 02/14/2023 CLINICAL DATA:  Neck trauma (Age >= 65y); Head trauma, focal neuro findings (Age 54-64y) EXAM: CT HEAD WITHOUT CONTRAST CT CERVICAL SPINE WITHOUT CONTRAST TECHNIQUE: Multidetector CT imaging of the head and cervical spine was performed following the standard protocol without intravenous contrast. Multiplanar CT image reconstructions of the cervical spine were also generated. RADIATION DOSE REDUCTION: This exam was performed according to the departmental dose-optimization program which includes automated  exposure control, adjustment of the mA and/or kV according to patient size and/or use of iterative reconstruction technique. COMPARISON:  CT head and C-spine 02/10/2023 FINDINGS: CT HEAD FINDINGS Brain: Cerebral ventricle sizes are concordant with the degree of cerebral volume loss. Patchy and confluent areas of decreased attenuation are noted throughout  the deep and periventricular white matter of the cerebral hemispheres bilaterally, compatible with chronic microvascular ischemic disease. Similar-appearing right parieto-occipital encephalomalacia. No evidence of large-territorial acute infarction. No parenchymal hemorrhage. No mass lesion. No extra-axial collection. No mass effect or midline shift. No hydrocephalus. Basilar cisterns are patent. Vascular: No hyperdense vessel. Atherosclerotic calcifications are present within the cavernous internal carotid arteries. Skull: No acute fracture or focal lesion. Sinuses/Orbits: Paranasal sinuses and mastoid air cells are clear. The orbits are unremarkable. Other: Left 11 mm parieto-occipital scalp hematoma formation. CT CERVICAL SPINE FINDINGS Alignment: Stable grade 1 anterolisthesis of C7 on T1. Skull base and vertebrae: C6-C7 anterior cervical discectomy and fusion surgical hardware. Multilevel moderate degenerative changes of the spine. Moderate severe osseous neural foraminal stenosis bilaterally at the C6-C7 level, left greater than right. No acute fracture. No aggressive appearing focal osseous lesion or focal pathologic process. Soft tissues and spinal canal: No prevertebral fluid or swelling. No visible canal hematoma. Upper chest: Unremarkable. Other: Atherosclerotic plaque of the aortic arch and its branches. IMPRESSION: 1. No acute intracranial abnormality. 2. No acute displaced fracture or traumatic listhesis of the cervical spine. 3.  Aortic Atherosclerosis (ICD10-I70.0). Electronically Signed   By: Tish Frederickson M.D.   On: 02/14/2023 18:06   CT HIP  LEFT WO CONTRAST Result Date: 02/14/2023 CLINICAL DATA:  Fall with injury after recent repair EXAM: CT OF THE LEFT HIP WITHOUT CONTRAST TECHNIQUE: Multidetector CT imaging of the left hip was performed according to the standard protocol. Multiplanar CT image reconstructions were also generated. RADIATION DOSE REDUCTION: This exam was performed according to the departmental dose-optimization program which includes automated exposure control, adjustment of the mA and/or kV according to patient size and/or use of iterative reconstruction technique. COMPARISON:  X-ray intraoperative left hip 02/11/2023, x-ray pelvis 02/10/2023 FINDINGS: Bones/Joint/Cartilage Total left hip arthroplasty with periprosthetic minimally displaced acute intertrochanteric fracture. Ligaments Suboptimally assessed by CT. Muscles and Tendons Subcutaneus soft tissue edema and emphysema along the left hip and proximal thigh soft tissues and musculature likely postsurgical in etiology. Soft tissues Subcutaneus soft tissue edema and emphysema along the left hip and proximal thigh soft tissues and musculature likely postsurgical in etiology. Left gluteal, hip, proximal thigh hematoma formation along the posterolateral aspect. Overlying skin staples. Other: Atherosclerotic plaque. IMPRESSION: 1. Total left hip arthroplasty with periprosthetic minimally displaced acute intertrochanteric fracture. 2. Subcutaneus soft tissue edema and emphysema along the left hip and proximal thigh soft tissues and musculature likely postsurgical in etiology. Differential diagnosis includes infection. Electronically Signed   By: Tish Frederickson M.D.   On: 02/14/2023 17:53   DG HIP UNILAT WITH PELVIS 1V LEFT Result Date: 02/11/2023 CLINICAL DATA:  Elective surgery. EXAM: DG HIP (WITH OR WITHOUT PELVIS) 1V*L* COMPARISON:  Preoperative imaging FINDINGS: Three fluoroscopic spot views of the pelvis and left hip obtained in the operating room. Images during hip  arthroplasty. Fluoroscopy time 12 seconds. Dose 0.636 mGy. IMPRESSION: Procedural fluoroscopy during left hip arthroplasty. Electronically Signed   By: Narda Rutherford M.D.   On: 02/11/2023 13:29   DG C-Arm 1-60 Min-No Report Result Date: 02/11/2023 Fluoroscopy was utilized by the requesting physician.  No radiographic interpretation.   CT HIP LEFT WO CONTRAST Result Date: 02/10/2023 CLINICAL DATA:  Fall with left hip pain EXAM: CT OF THE LEFT HIP WITHOUT CONTRAST TECHNIQUE: Multidetector CT imaging of the left hip was performed according to the standard protocol. Multiplanar CT image reconstructions were also generated. RADIATION DOSE REDUCTION: This exam was performed according to the  departmental dose-optimization program which includes automated exposure control, adjustment of the mA and/or kV according to patient size and/or use of iterative reconstruction technique. COMPARISON:  02/10/2023 FINDINGS: Bones/Joint/Cartilage Acute mildly impacted and comminuted sub capital femoral neck fracture with mild apex anterior angulation. No femoral head dislocation. Pubic symphysis and rami appear intact. No SI joint widening. Ligaments Suboptimally assessed by CT. Muscles and Tendons No intramuscular fluid collections.  No significant atrophy Soft tissues Soft tissue edema about the left hip. Vascular calcifications in the pelvis. IMPRESSION: Acute mildly impacted and comminuted subcapital femoral neck fracture with mild apex anterior and medial angulation. Electronically Signed   By: Jasmine Pang M.D.   On: 02/10/2023 21:23   DG Pelvis 1-2 Views Result Date: 02/10/2023 CLINICAL DATA:  Fall EXAM: PELVIS - 1-2 VIEW COMPARISON:  CT 12/13/2022 FINDINGS: Acute left femoral neck fracture. No femoral head dislocation. Pubic symphysis and rami appear intact. Mild to moderate hip degenerative changes. Vascular calcifications IMPRESSION: Acute left femoral neck fracture. Electronically Signed   By: Jasmine Pang M.D.    On: 02/10/2023 19:56   DG Chest 1 View Result Date: 02/10/2023 CLINICAL DATA:  Fall with hip pain EXAM: CHEST  1 VIEW COMPARISON:  12/27/2022 FINDINGS: Hardware in the cervical spine. Low lung volumes. Chronic interstitial opacities. Linear areas of atelectasis or possible scarring at the lung bases. Stable cardiomediastinal silhouette with aortic atherosclerosis. No pneumothorax IMPRESSION: Low lung volumes with chronic interstitial opacities and linear areas of atelectasis or possible scarring at the lung bases. Electronically Signed   By: Jasmine Pang M.D.   On: 02/10/2023 19:55   DG Knee 1-2 Views Left Result Date: 02/10/2023 CLINICAL DATA:  Fall with pain EXAM: LEFT KNEE - 1-2 VIEW COMPARISON:  None Available. FINDINGS: Limited by positioning. Sclerosis in the distal shaft and metaphysis of the femur suggestive of an infarct. No acute fracture or malalignment. No sizeable effusion IMPRESSION: 1. No acute osseous abnormality. Electronically Signed   By: Jasmine Pang M.D.   On: 02/10/2023 19:54   DG FEMUR MIN 2 VIEWS LEFT Result Date: 02/10/2023 CLINICAL DATA:  Fall left hip and sacral pain EXAM: LEFT FEMUR 2 VIEWS COMPARISON:  None Available. FINDINGS: Sclerosis in the distal shaft of the femur which may be due to infarct. Acute left femoral neck fracture. No femoral head dislocation. Vascular calcifications. IMPRESSION: Acute left femoral neck fracture. Electronically Signed   By: Jasmine Pang M.D.   On: 02/10/2023 19:53   CT HEAD WO CONTRAST Result Date: 02/10/2023 CLINICAL DATA:  Head and neck trauma from fall EXAM: CT HEAD WITHOUT CONTRAST CT CERVICAL SPINE WITHOUT CONTRAST TECHNIQUE: Multidetector CT imaging of the head and cervical spine was performed following the standard protocol without intravenous contrast. Multiplanar CT image reconstructions of the cervical spine were also generated. RADIATION DOSE REDUCTION: This exam was performed according to the departmental dose-optimization  program which includes automated exposure control, adjustment of the mA and/or kV according to patient size and/or use of iterative reconstruction technique. COMPARISON:  CT and MRI 12/28/2022 and CT 11/15/2017 FINDINGS: CT HEAD FINDINGS Brain: No intracranial hemorrhage, mass effect, or evidence of acute infarct. No hydrocephalus. No extra-axial fluid collection. Age related cerebral atrophy and chronic small vessel ischemic disease. Chronic right occipital infarct. Vascular: No hyperdense vessel. Intracranial arterial calcification. Skull: No fracture or focal lesion. Sinuses/Orbits: No acute finding. Other: None. CT CERVICAL SPINE FINDINGS Alignment: No evidence of traumatic malalignment. Skull base and vertebrae: No acute fracture. No primary bone lesion or  focal pathologic process. Soft tissues and spinal canal: No prevertebral fluid or swelling. No visible canal hematoma. Disc levels: Unchanged multilevel spondylosis greatest at C5-C6. ACDF C6-C7. Mild spinal canal narrowing at C3-C4, C5-C6 and C6-C7 secondary to posterior disc osteophyte complexes. No severe spinal canal narrowing. Multilevel facet arthropathy. Upper chest: No acute abnormality. Other: Carotid calcification. IMPRESSION: 1. No acute intracranial abnormality. 2. No acute fracture in the cervical spine. Electronically Signed   By: Minerva Fester M.D.   On: 02/10/2023 19:47   CT CERVICAL SPINE WO CONTRAST Result Date: 02/10/2023 CLINICAL DATA:  Head and neck trauma from fall EXAM: CT HEAD WITHOUT CONTRAST CT CERVICAL SPINE WITHOUT CONTRAST TECHNIQUE: Multidetector CT imaging of the head and cervical spine was performed following the standard protocol without intravenous contrast. Multiplanar CT image reconstructions of the cervical spine were also generated. RADIATION DOSE REDUCTION: This exam was performed according to the departmental dose-optimization program which includes automated exposure control, adjustment of the mA and/or kV  according to patient size and/or use of iterative reconstruction technique. COMPARISON:  CT and MRI 12/28/2022 and CT 11/15/2017 FINDINGS: CT HEAD FINDINGS Brain: No intracranial hemorrhage, mass effect, or evidence of acute infarct. No hydrocephalus. No extra-axial fluid collection. Age related cerebral atrophy and chronic small vessel ischemic disease. Chronic right occipital infarct. Vascular: No hyperdense vessel. Intracranial arterial calcification. Skull: No fracture or focal lesion. Sinuses/Orbits: No acute finding. Other: None. CT CERVICAL SPINE FINDINGS Alignment: No evidence of traumatic malalignment. Skull base and vertebrae: No acute fracture. No primary bone lesion or focal pathologic process. Soft tissues and spinal canal: No prevertebral fluid or swelling. No visible canal hematoma. Disc levels: Unchanged multilevel spondylosis greatest at C5-C6. ACDF C6-C7. Mild spinal canal narrowing at C3-C4, C5-C6 and C6-C7 secondary to posterior disc osteophyte complexes. No severe spinal canal narrowing. Multilevel facet arthropathy. Upper chest: No acute abnormality. Other: Carotid calcification. IMPRESSION: 1. No acute intracranial abnormality. 2. No acute fracture in the cervical spine. Electronically Signed   By: Minerva Fester M.D.   On: 02/10/2023 19:47    Assessment/Plan  Left Hip Fracture Post-Rehabilitation Discharged from the hospital on January 30th and rehab on February 8th. Currently using a walker and reports knee pain in the same leg, likely due to arthritis. Concerned about potential knee replacement but unwilling to undergo further surgery.No erythema,swelling or effusion.Left hip surgical incision healed without any erythema or drainage  - Continue using walker - Consider orthopedic follow-up if knee pain persists  Acute Cough  Reports cough, mucus production, nasal congestion, and morning wheezing. Symptoms persist with significant improvement. No COVID-19 or influenza testing  done. Bilateral lungs clear to auscultation. - Mucinex 600 mg tablet one by mouth daily x 10 days for cough  - Consider COVID-19 and influenza testing if symptoms persist - guaiFENesin (MUCINEX) 600 MG 12 hr tablet; Take 1 tablet (600 mg total) by mouth 2 (two) times daily for 10 days.  Dispense: 20 tablet; Refill: 0  Anxiety and Depression Out of clonazepam and has not taken citalopram due to recent antibiotic course for UTI. Reports increased stress and depression. Family member requested immediate clonazepam refill from [facility] until [facility] prescription arrives. - Refill citalopram and clonazepam - Send clonazepam prescription to [facility] for immediate pickup - Restart citalopram for depression and anxiety management - clonazePAM (KLONOPIN) 0.5 MG tablet; Take 1 tablet (0.5 mg total) by mouth 2 (two) times daily as needed for anxiety.  Dispense: 30 tablet; Refill: 3  Type 2 diabetes mellitus with stage  4 chronic kidney disease, with long-term current use of insulin (HCC) (Primary) Previous Hgb A1C 8.2  - continue on Novolog    Essential hypertension B/p well controlled  - continue dietary modification and exercise as tolerated   CKD (chronic kidney disease) stage 4, GFR 15-29 ml/min (HCC) CR 2.53 continue to avoid Nephrotoxins and dose all other medication for renal clearance  - continue to follow up with Nephrologist   Medication Management On atorvastatin, clopidogrel, famotidine, metoprolol, fluconazole, hydralazine, and Novolog. Not taking Ensure or Miralax. Blood sugars well-managed. - Discontinue Ensure - Continue current medications as prescribed - Monitor blood sugar levels with Novolog as per sliding scale  Follow-up - Has Follow up with nephrologist for blood work - Monitor hip surgery incision site for healing.      Family/ staff Communication: Reviewed plan of care with patient and daughter verbalized understanding   Labs/tests ordered: None   Next Appointment :  Return if symptoms worsen or fail to improve.  Spent 30 minutes of Face to face and non-face to face with patient  >50% time spent counseling; reviewing medical record; tests; labs; documentation and developing future plan of care.   [name], NP

## 2023-03-05 ENCOUNTER — Ambulatory Visit (INDEPENDENT_AMBULATORY_CARE_PROVIDER_SITE_OTHER): Payer: Medicare HMO | Admitting: Adult Health

## 2023-03-05 ENCOUNTER — Encounter: Payer: Self-pay | Admitting: Adult Health

## 2023-03-05 VITALS — BP 114/70 | HR 69 | Temp 97.8°F | Resp 17 | Ht 60.0 in | Wt 110.0 lb

## 2023-03-05 DIAGNOSIS — J209 Acute bronchitis, unspecified: Secondary | ICD-10-CM

## 2023-03-05 DIAGNOSIS — R6889 Other general symptoms and signs: Secondary | ICD-10-CM | POA: Diagnosis not present

## 2023-03-05 DIAGNOSIS — F419 Anxiety disorder, unspecified: Secondary | ICD-10-CM | POA: Diagnosis not present

## 2023-03-05 DIAGNOSIS — Z794 Long term (current) use of insulin: Secondary | ICD-10-CM

## 2023-03-05 DIAGNOSIS — N184 Chronic kidney disease, stage 4 (severe): Secondary | ICD-10-CM | POA: Diagnosis not present

## 2023-03-05 DIAGNOSIS — E1169 Type 2 diabetes mellitus with other specified complication: Secondary | ICD-10-CM | POA: Diagnosis not present

## 2023-03-05 DIAGNOSIS — I129 Hypertensive chronic kidney disease with stage 1 through stage 4 chronic kidney disease, or unspecified chronic kidney disease: Secondary | ICD-10-CM

## 2023-03-05 DIAGNOSIS — E1165 Type 2 diabetes mellitus with hyperglycemia: Secondary | ICD-10-CM

## 2023-03-05 DIAGNOSIS — Z8673 Personal history of transient ischemic attack (TIA), and cerebral infarction without residual deficits: Secondary | ICD-10-CM | POA: Diagnosis not present

## 2023-03-05 DIAGNOSIS — E785 Hyperlipidemia, unspecified: Secondary | ICD-10-CM

## 2023-03-05 DIAGNOSIS — Z96649 Presence of unspecified artificial hip joint: Secondary | ICD-10-CM | POA: Diagnosis not present

## 2023-03-05 DIAGNOSIS — F32A Depression, unspecified: Secondary | ICD-10-CM

## 2023-03-05 LAB — POCT INFLUENZA A/B
Influenza A, POC: NEGATIVE
Influenza B, POC: NEGATIVE

## 2023-03-05 LAB — POC COVID19 BINAXNOW: SARS Coronavirus 2 Ag: NEGATIVE

## 2023-03-05 MED ORDER — ALBUTEROL SULFATE HFA 108 (90 BASE) MCG/ACT IN AERS: 2.0000 | INHALATION_SPRAY | Freq: Four times a day (QID) | RESPIRATORY_TRACT | 0 refills | Status: DC | PRN

## 2023-03-05 MED ORDER — DOXYCYCLINE HYCLATE 100 MG PO TABS: 100.0000 mg | ORAL_TABLET | Freq: Two times a day (BID) | ORAL | 0 refills | Status: AC

## 2023-03-05 NOTE — Progress Notes (Signed)
[facility]  Provider:  Kenard Gower DNP  Code Status:  Limited DNR  Goals of Care:     03/05/2023    1:36 PM  Advanced Directives  Does Patient Have a Medical Advance Directive? Yes  Type of Estate agent of [place];Living will;Out of facility DNR (pink MOST or yellow form)  Does patient want to make changes to medical advance directive? No - Patient declined  Copy of Healthcare Power of Attorney in Chart? Yes - validated most recent copy scanned in chart (See row information)  Would patient like information on creating a medical advance directive? No - Patient declined     Chief Complaint  Patient presents with   Acute Visit    Possible flu.   Discussed the use of AI scribe software for clinical note transcription with the patient, who gave verbal consent to proceed.  HPI: Patient is a 89 y.o. female seen today for an acute visit for possible flu.  COVID-19 and Influenza A/B negative   Sandra [name] is an 89 year old female who presents with persistent cough and malaise. She is accompanied by her daughter, who is her primary caregiver.  She began feeling unwell the night before her discharge from a rehabilitation facility, where she was recovering from a left hip hemiarthroplasty performed on February 11, 2023. Her symptoms have persisted since returning home on February 27, 2023. She has been experiencing a cough with clear and yellow phlegm for the past five days, accompanied by a fever with a maximum temperature of 100F, managed with Tylenol. Wheezing, particularly at night, and occasional shortness of breath have also been noted. Due to her illness, she has been unable to participate in home health therapy.  She recently underwent a left hip hemiarthroplasty following a fracture. Since her discharge from the rehabilitation facility, she has been experiencing soreness in her leg and concerns about knee pressure from using a walker. Her recovery  has been hindered by her current illness, preventing her from continuing home health therapy.  Her current medications include hydralazine 25 mg three times a day and metoprolol succinate 25 mg daily for hypertension, NovoLog 6-8 units three times a day for diabetes, atorvastatin 80 mg daily for hyperlipidemia, clonazepam 0.5 mg twice a day as needed for anxiety, citalopram 10 mg daily for anxiety, Plavix daily for a history of stroke, and famotidine 40 mg daily for acid reflux. She has a history of multiple strokes, chronic kidney disease, and colitis. Her blood sugar was 160 mg/dL this morning, and she follows a sliding scale for insulin administration.    Past Medical History:  Diagnosis Date   Achilles tendon injury 11/27/2010   ALLERGIC RHINITIS 05/12/2006   Qualifier: Diagnosis of  By: Drue Novel MD, Nolon Rod.    Anxiety state 07/03/2009   Qualifier: Diagnosis of  By: Drue Novel MD, Jose E.    Back pain 07/01/2010   Chronic kidney disease (CKD), stage III (moderate) (HCC) 05/31/2014   Chronic right SI joint pain 09/22/2013   Colitis 2024   DEGENERATIVE JOINT DISEASE, CERVICAL SPINE 06/23/2006   Annotation: had a CAT scan with a cervical myelogram that show  left C6-7 and  C5-6 foraminal stenosis Qualifier: Diagnosis of  By: Drue Novel MD, Nolon Rod.    DEPRESSION 05/12/2006   Qualifier: Diagnosis of  By: Drue Novel MD, Jose E.    DIABETES MELLITUS, TYPE II 05/12/2006   Now following w/ Endo at [facility]     DIABETIC  RETINOPATHY 04/27/2007  Qualifier: Diagnosis of  By: Janit Bern     Encephalopathy, hypertensive    Essential hypertension 05/12/2006   Qualifier: Diagnosis of  By: Drue Novel MD, Nolon Rod.    GAIT DISTURBANCE 08/07/2009   Qualifier: Diagnosis of  By: Drue Novel MD, Nolon Rod.    GERD (gastroesophageal reflux disease)    History of colonoscopy    History of CT scan    History of mammogram    History of MRI    Hyperlipidemia associated with type 2 diabetes mellitus (HCC) 05/12/2006   Qualifier: Diagnosis of   By: Drue Novel MD, Jose E.    ILD (interstitial lung disease) (HCC) 05/31/2015   Lobar cerebral hemorrhage (HCC) 06/22/2019   NECK PAIN, CHRONIC 04/03/2008   Qualifier: Diagnosis of  By: Drue Novel MD, Nolon Rod.    Osteoarthritis 02/10/2016   Osteopenia    Osteoporosis 06/23/2006   Annotation: had a bone density test in 08-2004.  T score was -2.4 Qualifier: Diagnosis of  By: Drue Novel MD, Nolon Rod     [facility] (subarachnoid hemorrhage) (HCC)    Scleroderma (HCC) 02/28/2016   Stroke [facility])    Takotsubo cardiomyopathy    s/p stress MI with stress induced CM with normal coronary arteries by cath 2007 with normalization of LVF by echo 04/2005   TIA (transient ischemic attack)    Vasculitis of skin 09/28/2012   Vitamin D deficiency 10/01/2014    Past Surgical History:  Procedure Laterality Date   ABDOMINAL HYSTERECTOMY  01/20/1975   no oophorectomy   ANTERIOR APPROACH HEMI HIP ARTHROPLASTY Left 02/11/2023   Procedure: ANTERIOR APPROACH HEMI HIP ARTHROPLASTY;  Surgeon: Kathryne Hitch, MD;  Location: [facility] OR;  Service: Orthopedics;  Laterality: Left;   APPENDECTOMY     CARDIAC CATHETERIZATION     CATARACT EXTRACTION, BILATERAL  01/19/2009   FRACTURE SURGERY     SPINAL FUSION  01/19/1998   Dr. Venetia Maxon   TONSILLECTOMY      Allergies  Allergen Reactions   Cephalexin Nausea And Vomiting    Pt stated made severely sick, will never take again   Pioglitazone Swelling    REACTION: EDEMA   Nintedanib Diarrhea   Sertraline Nausea And Vomiting    Outpatient Encounter Medications as of 03/05/2023  Medication Sig   acetaminophen (TYLENOL) 500 MG tablet Take 500 mg by mouth every 6 (six) hours as needed for mild pain (pain score 1-3) or headache.   atorvastatin (LIPITOR) 80 MG tablet TAKE 1 TABLET EVERY DAY (Patient taking differently: Take 80 mg by mouth every evening.)   citalopram (CELEXA) 10 MG tablet Take 1 tablet (10 mg total) by mouth daily.   clonazePAM (KLONOPIN) 0.5 MG tablet Take 1 tablet (0.5 mg total)  by mouth 2 (two) times daily as needed for anxiety.   clopidogrel (PLAVIX) 75 MG tablet TAKE 1 TABLET EVERY DAY   famotidine (PEPCID) 40 MG tablet TAKE 1 TABLET EVERY DAY   guaiFENesin (MUCINEX) 600 MG 12 hr tablet Take 1 tablet (600 mg total) by mouth 2 (two) times daily for 10 days.   hydrALAZINE (APRESOLINE) 25 MG tablet Take 1 tablet (25 mg total) by mouth 3 (three) times daily.   metoprolol succinate (TOPROL-XL) 25 MG 24 hr tablet Take 1 tablet (25 mg total) by mouth daily. Take with or immediately following a meal.   NOVOLOG FLEXPEN 100 UNIT/ML FlexPen Inject 6-8 Units into the skin 3 (three) times daily with meals as needed for high blood sugar. Pt completes sliding scale.  loperamide (IMODIUM) 2 MG capsule Take 1 capsule (2 mg total) by mouth as needed for diarrhea or loose stools. (Patient not taking: Reported on 03/05/2023)   No facility-administered encounter medications on file as of 03/05/2023.    Review of Systems:  Review of Systems  Constitutional:  Negative for appetite change, chills, fatigue and fever.  HENT:  Negative for congestion, hearing loss, rhinorrhea and sore throat.   Eyes: Negative.   Respiratory:  Positive for cough, shortness of breath and wheezing.   Cardiovascular:  Negative for chest pain, palpitations and leg swelling.  Gastrointestinal:  Negative for abdominal pain, constipation, diarrhea, nausea and vomiting.  Genitourinary:  Negative for dysuria.  Musculoskeletal:  Negative for arthralgias, back pain and myalgias.  Skin:  Negative for color change, rash and wound.  Neurological:  Negative for dizziness, weakness and headaches.  Psychiatric/Behavioral:  Negative for behavioral problems. The patient is not nervous/anxious.     Health Maintenance  Topic Date Due   Zoster Vaccines- Shingrix (1 of 2) 01/05/1986   OPHTHALMOLOGY EXAM  07/29/2017   DTaP/Tdap/Td (3 - Tdap) 06/19/2018   Medicare Annual Wellness (AWV)  06/11/2022   COVID-19 Vaccine (4 -  2024-25 season) 09/20/2022   HEMOGLOBIN A1C  05/30/2023   FOOT EXAM  06/02/2023   Pneumonia Vaccine 93+ Years old  Completed   INFLUENZA VACCINE  Completed   DEXA SCAN  Completed   HPV VACCINES  Aged Out   MAMMOGRAM  Discontinued    Physical Exam: Vitals:   03/05/23 1337  BP: 114/70  Pulse: 69  Resp: 17  Temp: 97.8 F (36.6 C)  SpO2: 98%  Weight: 110 lb (49.9 kg)  Height: 5' (1.524 m)   Body mass index is 21.48 kg/m. Physical Exam Constitutional:      Appearance: Normal appearance.  HENT:     Head: Normocephalic and atraumatic.     Nose: Nose normal.     Mouth/Throat:     Mouth: Mucous membranes are moist.  Eyes:     Conjunctiva/sclera: Conjunctivae normal.  Cardiovascular:     Rate and Rhythm: Normal rate and regular rhythm.  Pulmonary:     Effort: Pulmonary effort is normal.     Breath sounds: Normal breath sounds.  Abdominal:     General: Bowel sounds are normal.     Palpations: Abdomen is soft.  Musculoskeletal:        General: Normal range of motion.     Cervical back: Normal range of motion.  Skin:    General: Skin is warm and dry.  Neurological:     General: No focal deficit present.     Mental Status: She is alert and oriented to person, place, and time.  Psychiatric:        Mood and Affect: Mood normal.        Behavior: Behavior normal.        Thought Content: Thought content normal.        Judgment: Judgment normal.     Labs reviewed: Basic Metabolic Panel: Recent Labs    06/08/22 0808 07/16/22 0010 12/20/22 0530 12/21/22 0750 12/28/22 0704 12/29/22 0452 02/15/23 0509 02/16/23 0542 02/17/23 0636  NA 143   < > 141   < > 140   < > 139 138 135  K 4.6   < > 4.3   < > 4.0   < > 3.6 3.6 3.5  CL 112*   < > 119*   < > 106   < >  107 108 104  CO2 23   < > 16*   < > 25   < > 19* 18* 18*  GLUCOSE 128*   < > 177*   < > 153*   < > 164* 195* 176*  BUN 40*   < > 53*   < > 29*   < > 40* 41* 40*  CREATININE 1.83*   < > 4.21*   < > 3.42*   < > 2.61*  2.47* 2.53*  CALCIUM 9.0   < > 7.9*   < > 7.9*   < > 8.4* 8.2* 8.1*  MG  --   --   --   --  1.3*  --   --   --   --   PHOS  --   --  5.7*  --   --   --   --   --   --   TSH 3.50  --   --   --   --   --   --   --   --    < > = values in this interval not displayed.   Liver Function Tests: Recent Labs    12/13/22 1323 12/20/22 0530 12/28/22 0704 12/29/22 0452 01/01/23 1418 01/19/23 1455  AST 20  --  18 18 14 15   ALT 15  --  14 13 10 8   ALKPHOS 73  --  52 51  --   --   BILITOT 0.6  --  0.5 0.5 0.5 0.5  PROT 6.4*  --  5.5* 5.5* 6.3 6.4  ALBUMIN 3.2* 2.7* 2.7* 2.6*  --   --    Recent Labs    12/13/22 1323 12/19/22 1255  LIPASE 28 54*   No results for input(s): "AMMONIA" in the last 8760 hours. CBC: Recent Labs    01/19/23 1455 02/10/23 1728 02/11/23 0419 02/12/23 0621 02/15/23 0509 02/16/23 0542 02/17/23 0636  WBC 10.8 6.1 7.4   < > 10.6* 9.0 6.0  NEUTROABS 8,478* 3.9 6.0  --   --   --   --   HGB 8.8* 8.9* 8.7*   < > 7.6* 8.3* 8.8*  HCT 27.4* 27.4* 27.0*   < > 23.8* 25.6* 27.4*  MCV 97.2 96.5 97.5   < > 96.0 96.2 96.8  PLT 344 233 223   < > 255 254 278   < > = values in this interval not displayed.   Lipid Panel: Recent Labs    06/08/22 0808 12/29/22 0452  CHOL 153 95  HDL 40* 35*  LDLCALC 80 37  TRIG 240* 116  CHOLHDL 3.8 2.7   Lab Results  Component Value Date   HGBA1C 8.2 (H) 11/30/2022    Procedures since last visit: CT HEAD WO CONTRAST ( ) Result Date: 02/14/2023 CLINICAL DATA:  Neck trauma (Age >= 65y); Head trauma, focal neuro findings (Age 1-64y) EXAM: CT HEAD WITHOUT CONTRAST CT CERVICAL SPINE WITHOUT CONTRAST TECHNIQUE: Multidetector CT imaging of the head and cervical spine was performed following the standard protocol without intravenous contrast. Multiplanar CT image reconstructions of the cervical spine were also generated. RADIATION DOSE REDUCTION: This exam was performed according to the departmental dose-optimization program which  includes automated exposure control, adjustment of the mA and/or kV according to patient size and/or use of iterative reconstruction technique. COMPARISON:  CT head and C-spine 02/10/2023 FINDINGS: CT HEAD FINDINGS Brain: Cerebral ventricle sizes are concordant with the degree of cerebral volume loss. Patchy and confluent areas of  decreased attenuation are noted throughout the deep and periventricular white matter of the cerebral hemispheres bilaterally, compatible with chronic microvascular ischemic disease. Similar-appearing right parieto-occipital encephalomalacia. No evidence of large-territorial acute infarction. No parenchymal hemorrhage. No mass lesion. No extra-axial collection. No mass effect or midline shift. No hydrocephalus. Basilar cisterns are patent. Vascular: No hyperdense vessel. Atherosclerotic calcifications are present within the cavernous internal carotid arteries. Skull: No acute fracture or focal lesion. Sinuses/Orbits: Paranasal sinuses and mastoid air cells are clear. The orbits are unremarkable. Other: Left 11 mm parieto-occipital scalp hematoma formation. CT CERVICAL SPINE FINDINGS Alignment: Stable grade 1 anterolisthesis of C7 on T1. Skull base and vertebrae: C6-C7 anterior cervical discectomy and fusion surgical hardware. Multilevel moderate degenerative changes of the spine. Moderate severe osseous neural foraminal stenosis bilaterally at the C6-C7 level, left greater than right. No acute fracture. No aggressive appearing focal osseous lesion or focal pathologic process. Soft tissues and spinal canal: No prevertebral fluid or swelling. No visible canal hematoma. Upper chest: Unremarkable. Other: Atherosclerotic plaque of the aortic arch and its branches. IMPRESSION: 1. No acute intracranial abnormality. 2. No acute displaced fracture or traumatic listhesis of the cervical spine. 3.  Aortic Atherosclerosis (ICD10-I70.0). Electronically Signed   By: Tish Frederickson M.D.   On: 02/14/2023  18:06   CT CERVICAL SPINE WO CONTRAST Result Date: 02/14/2023 CLINICAL DATA:  Neck trauma (Age >= 65y); Head trauma, focal neuro findings (Age 43-64y) EXAM: CT HEAD WITHOUT CONTRAST CT CERVICAL SPINE WITHOUT CONTRAST TECHNIQUE: Multidetector CT imaging of the head and cervical spine was performed following the standard protocol without intravenous contrast. Multiplanar CT image reconstructions of the cervical spine were also generated. RADIATION DOSE REDUCTION: This exam was performed according to the departmental dose-optimization program which includes automated exposure control, adjustment of the mA and/or kV according to patient size and/or use of iterative reconstruction technique. COMPARISON:  CT head and C-spine 02/10/2023 FINDINGS: CT HEAD FINDINGS Brain: Cerebral ventricle sizes are concordant with the degree of cerebral volume loss. Patchy and confluent areas of decreased attenuation are noted throughout the deep and periventricular white matter of the cerebral hemispheres bilaterally, compatible with chronic microvascular ischemic disease. Similar-appearing right parieto-occipital encephalomalacia. No evidence of large-territorial acute infarction. No parenchymal hemorrhage. No mass lesion. No extra-axial collection. No mass effect or midline shift. No hydrocephalus. Basilar cisterns are patent. Vascular: No hyperdense vessel. Atherosclerotic calcifications are present within the cavernous internal carotid arteries. Skull: No acute fracture or focal lesion. Sinuses/Orbits: Paranasal sinuses and mastoid air cells are clear. The orbits are unremarkable. Other: Left 11 mm parieto-occipital scalp hematoma formation. CT CERVICAL SPINE FINDINGS Alignment: Stable grade 1 anterolisthesis of C7 on T1. Skull base and vertebrae: C6-C7 anterior cervical discectomy and fusion surgical hardware. Multilevel moderate degenerative changes of the spine. Moderate severe osseous neural foraminal stenosis bilaterally at  the C6-C7 level, left greater than right. No acute fracture. No aggressive appearing focal osseous lesion or focal pathologic process. Soft tissues and spinal canal: No prevertebral fluid or swelling. No visible canal hematoma. Upper chest: Unremarkable. Other: Atherosclerotic plaque of the aortic arch and its branches. IMPRESSION: 1. No acute intracranial abnormality. 2. No acute displaced fracture or traumatic listhesis of the cervical spine. 3.  Aortic Atherosclerosis (ICD10-I70.0). Electronically Signed   By: Tish Frederickson M.D.   On: 02/14/2023 18:06   CT HIP LEFT WO CONTRAST Result Date: 02/14/2023 CLINICAL DATA:  Fall with injury after recent repair EXAM: CT OF THE LEFT HIP WITHOUT CONTRAST TECHNIQUE: Multidetector CT imaging of  the left hip was performed according to the standard protocol. Multiplanar CT image reconstructions were also generated. RADIATION DOSE REDUCTION: This exam was performed according to the departmental dose-optimization program which includes automated exposure control, adjustment of the mA and/or kV according to patient size and/or use of iterative reconstruction technique. COMPARISON:  X-ray intraoperative left hip 02/11/2023, x-ray pelvis 02/10/2023 FINDINGS: Bones/Joint/Cartilage Total left hip arthroplasty with periprosthetic minimally displaced acute intertrochanteric fracture. Ligaments Suboptimally assessed by CT. Muscles and Tendons Subcutaneus soft tissue edema and emphysema along the left hip and proximal thigh soft tissues and musculature likely postsurgical in etiology. Soft tissues Subcutaneus soft tissue edema and emphysema along the left hip and proximal thigh soft tissues and musculature likely postsurgical in etiology. Left gluteal, hip, proximal thigh hematoma formation along the posterolateral aspect. Overlying skin staples. Other: Atherosclerotic plaque. IMPRESSION: 1. Total left hip arthroplasty with periprosthetic minimally displaced acute intertrochanteric  fracture. 2. Subcutaneus soft tissue edema and emphysema along the left hip and proximal thigh soft tissues and musculature likely postsurgical in etiology. Differential diagnosis includes infection. Electronically Signed   By: Tish Frederickson M.D.   On: 02/14/2023 17:53   DG HIP UNILAT WITH PELVIS 1V LEFT Result Date: 02/11/2023 CLINICAL DATA:  Elective surgery. EXAM: DG HIP (WITH OR WITHOUT PELVIS) 1V*L* COMPARISON:  Preoperative imaging FINDINGS: Three fluoroscopic spot views of the pelvis and left hip obtained in the operating room. Images during hip arthroplasty. Fluoroscopy time 12 seconds. Dose 0.636 mGy. IMPRESSION: Procedural fluoroscopy during left hip arthroplasty. Electronically Signed   By: Narda Rutherford M.D.   On: 02/11/2023 13:29   DG C-Arm 1-60 Min-No Report Result Date: 02/11/2023 Fluoroscopy was utilized by the requesting physician.  No radiographic interpretation.   CT HIP LEFT WO CONTRAST Result Date: 02/10/2023 CLINICAL DATA:  Fall with left hip pain EXAM: CT OF THE LEFT HIP WITHOUT CONTRAST TECHNIQUE: Multidetector CT imaging of the left hip was performed according to the standard protocol. Multiplanar CT image reconstructions were also generated. RADIATION DOSE REDUCTION: This exam was performed according to the departmental dose-optimization program which includes automated exposure control, adjustment of the mA and/or kV according to patient size and/or use of iterative reconstruction technique. COMPARISON:  02/10/2023 FINDINGS: Bones/Joint/Cartilage Acute mildly impacted and comminuted sub capital femoral neck fracture with mild apex anterior angulation. No femoral head dislocation. Pubic symphysis and rami appear intact. No SI joint widening. Ligaments Suboptimally assessed by CT. Muscles and Tendons No intramuscular fluid collections.  No significant atrophy Soft tissues Soft tissue edema about the left hip. Vascular calcifications in the pelvis. IMPRESSION: Acute mildly  impacted and comminuted subcapital femoral neck fracture with mild apex anterior and medial angulation. Electronically Signed   By: Jasmine Pang M.D.   On: 02/10/2023 21:23   DG Pelvis 1-2 Views Result Date: 02/10/2023 CLINICAL DATA:  Fall EXAM: PELVIS - 1-2 VIEW COMPARISON:  CT 12/13/2022 FINDINGS: Acute left femoral neck fracture. No femoral head dislocation. Pubic symphysis and rami appear intact. Mild to moderate hip degenerative changes. Vascular calcifications IMPRESSION: Acute left femoral neck fracture. Electronically Signed   By: Jasmine Pang M.D.   On: 02/10/2023 19:56   DG Chest 1 View Result Date: 02/10/2023 CLINICAL DATA:  Fall with hip pain EXAM: CHEST  1 VIEW COMPARISON:  12/27/2022 FINDINGS: Hardware in the cervical spine. Low lung volumes. Chronic interstitial opacities. Linear areas of atelectasis or possible scarring at the lung bases. Stable cardiomediastinal silhouette with aortic atherosclerosis. No pneumothorax IMPRESSION: Low lung volumes with chronic  interstitial opacities and linear areas of atelectasis or possible scarring at the lung bases. Electronically Signed   By: Jasmine Pang M.D.   On: 02/10/2023 19:55   DG Knee 1-2 Views Left Result Date: 02/10/2023 CLINICAL DATA:  Fall with pain EXAM: LEFT KNEE - 1-2 VIEW COMPARISON:  None Available. FINDINGS: Limited by positioning. Sclerosis in the distal shaft and metaphysis of the femur suggestive of an infarct. No acute fracture or malalignment. No sizeable effusion IMPRESSION: 1. No acute osseous abnormality. Electronically Signed   By: Jasmine Pang M.D.   On: 02/10/2023 19:54   DG FEMUR MIN 2 VIEWS LEFT Result Date: 02/10/2023 CLINICAL DATA:  Fall left hip and sacral pain EXAM: LEFT FEMUR 2 VIEWS COMPARISON:  None Available. FINDINGS: Sclerosis in the distal shaft of the femur which may be due to infarct. Acute left femoral neck fracture. No femoral head dislocation. Vascular calcifications. IMPRESSION: Acute left femoral  neck fracture. Electronically Signed   By: Jasmine Pang M.D.   On: 02/10/2023 19:53   CT HEAD WO CONTRAST Result Date: 02/10/2023 CLINICAL DATA:  Head and neck trauma from fall EXAM: CT HEAD WITHOUT CONTRAST CT CERVICAL SPINE WITHOUT CONTRAST TECHNIQUE: Multidetector CT imaging of the head and cervical spine was performed following the standard protocol without intravenous contrast. Multiplanar CT image reconstructions of the cervical spine were also generated. RADIATION DOSE REDUCTION: This exam was performed according to the departmental dose-optimization program which includes automated exposure control, adjustment of the mA and/or kV according to patient size and/or use of iterative reconstruction technique. COMPARISON:  CT and MRI 12/28/2022 and CT 11/15/2017 FINDINGS: CT HEAD FINDINGS Brain: No intracranial hemorrhage, mass effect, or evidence of acute infarct. No hydrocephalus. No extra-axial fluid collection. Age related cerebral atrophy and chronic small vessel ischemic disease. Chronic right occipital infarct. Vascular: No hyperdense vessel. Intracranial arterial calcification. Skull: No fracture or focal lesion. Sinuses/Orbits: No acute finding. Other: None. CT CERVICAL SPINE FINDINGS Alignment: No evidence of traumatic malalignment. Skull base and vertebrae: No acute fracture. No primary bone lesion or focal pathologic process. Soft tissues and spinal canal: No prevertebral fluid or swelling. No visible canal hematoma. Disc levels: Unchanged multilevel spondylosis greatest at C5-C6. ACDF C6-C7. Mild spinal canal narrowing at C3-C4, C5-C6 and C6-C7 secondary to posterior disc osteophyte complexes. No severe spinal canal narrowing. Multilevel facet arthropathy. Upper chest: No acute abnormality. Other: Carotid calcification. IMPRESSION: 1. No acute intracranial abnormality. 2. No acute fracture in the cervical spine. Electronically Signed   By: Minerva Fester M.D.   On: 02/10/2023 19:47   CT  CERVICAL SPINE WO CONTRAST Result Date: 02/10/2023 CLINICAL DATA:  Head and neck trauma from fall EXAM: CT HEAD WITHOUT CONTRAST CT CERVICAL SPINE WITHOUT CONTRAST TECHNIQUE: Multidetector CT imaging of the head and cervical spine was performed following the standard protocol without intravenous contrast. Multiplanar CT image reconstructions of the cervical spine were also generated. RADIATION DOSE REDUCTION: This exam was performed according to the departmental dose-optimization program which includes automated exposure control, adjustment of the mA and/or kV according to patient size and/or use of iterative reconstruction technique. COMPARISON:  CT and MRI 12/28/2022 and CT 11/15/2017 FINDINGS: CT HEAD FINDINGS Brain: No intracranial hemorrhage, mass effect, or evidence of acute infarct. No hydrocephalus. No extra-axial fluid collection. Age related cerebral atrophy and chronic small vessel ischemic disease. Chronic right occipital infarct. Vascular: No hyperdense vessel. Intracranial arterial calcification. Skull: No fracture or focal lesion. Sinuses/Orbits: No acute finding. Other: None. CT CERVICAL SPINE FINDINGS  Alignment: No evidence of traumatic malalignment. Skull base and vertebrae: No acute fracture. No primary bone lesion or focal pathologic process. Soft tissues and spinal canal: No prevertebral fluid or swelling. No visible canal hematoma. Disc levels: Unchanged multilevel spondylosis greatest at C5-C6. ACDF C6-C7. Mild spinal canal narrowing at C3-C4, C5-C6 and C6-C7 secondary to posterior disc osteophyte complexes. No severe spinal canal narrowing. Multilevel facet arthropathy. Upper chest: No acute abnormality. Other: Carotid calcification. IMPRESSION: 1. No acute intracranial abnormality. 2. No acute fracture in the cervical spine. Electronically Signed   By: Minerva Fester M.D.   On: 02/10/2023 19:47    Assessment/Plan  1. Acute bronchitis, unspecified organism (Primary)  Flu-like  symptoms -  Cough with yellow phlegm, wheezing, and low-grade fever for 5 days. No shortness of breath. Lung exam with crackles. Negative COVID test. -Start Doxycycline 100mg  twice daily for 10 days. -Start Albuterol inhaler as needed for wheezing. - doxycycline (VIBRA-TABS) 100 MG tablet; Take 1 tablet (100 mg total) by mouth 2 (two) times daily for 10 days.  Dispense: 20 tablet; Refill: 0 - albuterol (VENTOLIN HFA) 108 (90 Base) MCG/ACT inhaler; Inhale 2 puffs into the lungs every 6 (six) hours as needed for wheezing or shortness of breath.  Dispense: 8 g; Refill: 0 - POC Influenza A/B negative - POC COVID-19 negative  2. S/P hip hemiarthroplasty -  Recent surgery with no current complaints of pain.  -Continue home health therapy as tolerated once acute illness resolves  3. Benign hypertension with chronic kidney disease, stage IV (HCC) -  Blood pressure controlled at 114/70. -Continue Hydralazine 25mg  three times daily and Metoprolol succinate 25mg  daily.  5. Type 2 diabetes mellitus with hyperglycemia, with long-term current use of insulin (HCC) Lab Results  Component Value Date   HGBA1C 8.2 (H) 11/30/2022    -  Recent blood sugars in the 160s with sliding scale NovoLog 6-8 units three times daily. -Continue current regimen and monitor blood sugars.  6. Anxiety -  On Clonazepam 0.5mg  twice daily as needed and Citalopram 10mg  daily. -Continue current regimen.  7. Hyperlipidemia associated with type 2 diabetes mellitus (HCC) Lab Results  Component Value Date   CHOL 95 12/29/2022   HDL 35 (L) 12/29/2022   LDLCALC 37 12/29/2022   LDLDIRECT 110.0 10/13/2016   TRIG 116 12/29/2022   CHOLHDL 2.7 12/29/2022    -  On Atorvastatin 80mg  daily. -Continue current regimen.  8. History of CVA (cerebrovascular accident) -  continue Atorvastatin and Plavix    Follow-up Return to clinic if symptoms do not improve or if shortness of breath develops.     Labs/tests ordered:  POC  COVID-19 and Influenza A/B    Merrell Rettinger Medina-Vargas, NP

## 2023-03-10 DIAGNOSIS — I2510 Atherosclerotic heart disease of native coronary artery without angina pectoris: Secondary | ICD-10-CM | POA: Diagnosis not present

## 2023-03-10 DIAGNOSIS — E113599 Type 2 diabetes mellitus with proliferative diabetic retinopathy without macular edema, unspecified eye: Secondary | ICD-10-CM | POA: Diagnosis not present

## 2023-03-10 DIAGNOSIS — M1990 Unspecified osteoarthritis, unspecified site: Secondary | ICD-10-CM | POA: Diagnosis not present

## 2023-03-10 DIAGNOSIS — I420 Dilated cardiomyopathy: Secondary | ICD-10-CM | POA: Diagnosis not present

## 2023-03-10 DIAGNOSIS — I129 Hypertensive chronic kidney disease with stage 1 through stage 4 chronic kidney disease, or unspecified chronic kidney disease: Secondary | ICD-10-CM | POA: Diagnosis not present

## 2023-03-10 DIAGNOSIS — N183 Chronic kidney disease, stage 3 unspecified: Secondary | ICD-10-CM | POA: Diagnosis not present

## 2023-03-10 DIAGNOSIS — J849 Interstitial pulmonary disease, unspecified: Secondary | ICD-10-CM | POA: Diagnosis not present

## 2023-03-10 DIAGNOSIS — E1122 Type 2 diabetes mellitus with diabetic chronic kidney disease: Secondary | ICD-10-CM | POA: Diagnosis not present

## 2023-03-10 DIAGNOSIS — M80052D Age-related osteoporosis with current pathological fracture, left femur, subsequent encounter for fracture with routine healing: Secondary | ICD-10-CM | POA: Diagnosis not present

## 2023-03-11 ENCOUNTER — Telehealth: Payer: Self-pay

## 2023-03-11 NOTE — Telephone Encounter (Signed)
Katie from [facility] was calling to get verbal orders for PT for 1 time a week for 9 weeks

## 2023-03-11 NOTE — Telephone Encounter (Signed)
Okay to give verbal orders per [facility] policy.

## 2023-03-12 ENCOUNTER — Other Ambulatory Visit: Payer: Self-pay

## 2023-03-12 ENCOUNTER — Emergency Department (HOSPITAL_BASED_OUTPATIENT_CLINIC_OR_DEPARTMENT_OTHER): Payer: Medicare HMO

## 2023-03-12 ENCOUNTER — Emergency Department (HOSPITAL_BASED_OUTPATIENT_CLINIC_OR_DEPARTMENT_OTHER)
Admission: EM | Admit: 2023-03-12 | Discharge: 2023-03-12 | Disposition: A | Discharge status: Home / Self Care | Payer: Medicare HMO | Attending: Emergency Medicine | Admitting: Emergency Medicine

## 2023-03-12 ENCOUNTER — Encounter (HOSPITAL_BASED_OUTPATIENT_CLINIC_OR_DEPARTMENT_OTHER): Payer: Self-pay | Admitting: Emergency Medicine

## 2023-03-12 DIAGNOSIS — Z4789 Encounter for other orthopedic aftercare: Secondary | ICD-10-CM | POA: Diagnosis not present

## 2023-03-12 DIAGNOSIS — R531 Weakness: Secondary | ICD-10-CM | POA: Diagnosis not present

## 2023-03-12 DIAGNOSIS — Z7902 Long term (current) use of antithrombotics/antiplatelets: Secondary | ICD-10-CM | POA: Insufficient documentation

## 2023-03-12 DIAGNOSIS — J069 Acute upper respiratory infection, unspecified: Secondary | ICD-10-CM | POA: Insufficient documentation

## 2023-03-12 DIAGNOSIS — R0602 Shortness of breath: Secondary | ICD-10-CM | POA: Diagnosis not present

## 2023-03-12 DIAGNOSIS — R0989 Other specified symptoms and signs involving the circulatory and respiratory systems: Secondary | ICD-10-CM | POA: Diagnosis not present

## 2023-03-12 DIAGNOSIS — R053 Chronic cough: Secondary | ICD-10-CM

## 2023-03-12 DIAGNOSIS — R0981 Nasal congestion: Secondary | ICD-10-CM | POA: Diagnosis not present

## 2023-03-12 DIAGNOSIS — R059 Cough, unspecified: Secondary | ICD-10-CM | POA: Diagnosis not present

## 2023-03-12 LAB — CBC
HCT: 26 % — ABNORMAL LOW (ref 36.0–46.0)
Hemoglobin: 8.2 g/dL — ABNORMAL LOW (ref 12.0–15.0)
MCH: 30.6 pg (ref 26.0–34.0)
MCHC: 31.5 g/dL (ref 30.0–36.0)
MCV: 97 fL (ref 80.0–100.0)
Platelets: 320 10*3/uL (ref 150–400)
RBC: 2.68 MIL/uL — ABNORMAL LOW (ref 3.87–5.11)
RDW: 14.4 % (ref 11.5–15.5)
WBC: 6 10*3/uL (ref 4.0–10.5)
nRBC: 0 % (ref 0.0–0.2)

## 2023-03-12 LAB — BASIC METABOLIC PANEL
Anion gap: 9 (ref 5–15)
BUN: 37 mg/dL — ABNORMAL HIGH (ref 8–23)
CO2: 20 mmol/L — ABNORMAL LOW (ref 22–32)
Calcium: 8.2 mg/dL — ABNORMAL LOW (ref 8.9–10.3)
Chloride: 107 mmol/L (ref 98–111)
Creatinine, Ser: 2.02 mg/dL — ABNORMAL HIGH (ref 0.44–1.00)
GFR, Estimated: 23 mL/min — ABNORMAL LOW (ref >60)
Glucose, Bld: 246 mg/dL — ABNORMAL HIGH (ref 70–99)
Potassium: 4.2 mmol/L (ref 3.5–5.1)
Sodium: 136 mmol/L (ref 135–145)

## 2023-03-12 LAB — BRAIN NATRIURETIC PEPTIDE: B Natriuretic Peptide: 348.7 pg/mL — ABNORMAL HIGH (ref 0.0–100.0)

## 2023-03-12 LAB — RESP PANEL BY RT-PCR (RSV, FLU A&B, COVID)  RVPGX2
Influenza A by PCR: NEGATIVE
Influenza B by PCR: NEGATIVE
Resp Syncytial Virus by PCR: NEGATIVE
SARS Coronavirus 2 by RT PCR: NEGATIVE

## 2023-03-12 MED ORDER — ALBUTEROL SULFATE HFA 108 (90 BASE) MCG/ACT IN AERS: 2.0000 | INHALATION_SPRAY | RESPIRATORY_TRACT | Status: DC | PRN

## 2023-03-12 NOTE — ED Provider Notes (Signed)
[facility] Provider Note   CSN: [id number] Arrival date & time: 03/12/23  1330     History  Chief Complaint  Patient presents with   Shortness of Breath    Sandra [name] is a 89 y.o. female.  Pt with c/o non prod cough (occasional brings up small amount phlegm), nasal and chest congestion, rhinorrhea, in past week. No sore throat or trouble swallowing. No chest pain. Mild  sob/doe. No abd pain or nvd. No dysuria or gu c/o. Had left hip fx/surgery 1/23. No new/worsening leg/calf pain or swelling. Low grade fever. Family member w recent flu dx - pt had flu/covid test negative. Is eating/drinking. Is compliant w meds. Was placed on doxycyline in past week but feels no better.   The history is provided by the patient, medical records and a relative.  Shortness of Breath Associated symptoms: cough and fever   Associated symptoms: no abdominal pain, no chest pain, no headaches, no neck pain, no rash, no sore throat and no vomiting        Home Medications Prior to Admission medications   Medication Sig Start Date End Date Taking? Authorizing Provider  acetaminophen (TYLENOL) 500 MG tablet Take 500 mg by mouth every 6 (six) hours as needed for mild pain (pain score 1-3) or headache.    [provider]  albuterol (VENTOLIN HFA) 108 (90 Base) MCG/ACT inhaler Inhale 2 puffs into the lungs every 6 (six) hours as needed for wheezing or shortness of breath. 03/05/23   Medina-Vargas, Monina C, NP  atorvastatin (LIPITOR) 80 MG tablet TAKE 1 TABLET EVERY DAY Patient taking differently: Take 80 mg by mouth every evening. 11/26/22   Ngetich, Dinah C, NP  citalopram (CELEXA) 10 MG tablet Take 1 tablet (10 mg total) by mouth daily. 03/03/23   Ngetich, Dinah C, NP  clonazePAM (KLONOPIN) 0.5 MG tablet Take 1 tablet (0.5 mg total) by mouth 2 (two) times daily as needed for anxiety. 03/03/23   Ngetich, Dinah C, NP  clopidogrel (PLAVIX) 75 MG tablet TAKE  1 TABLET EVERY DAY 02/10/23   Ngetich, Dinah C, NP  doxycycline (VIBRA-TABS) 100 MG tablet Take 1 tablet (100 mg total) by mouth 2 (two) times daily for 10 days. 03/05/23 03/15/23  Medina-Vargas, Monina C, NP  famotidine (PEPCID) 40 MG tablet TAKE 1 TABLET EVERY DAY 02/05/23   Ngetich, Dinah C, NP  guaiFENesin (MUCINEX) 600 MG 12 hr tablet Take 1 tablet (600 mg total) by mouth 2 (two) times daily for 10 days. 03/03/23 03/13/23  Ngetich, Dinah C, NP  hydrALAZINE (APRESOLINE) 25 MG tablet Take 1 tablet (25 mg total) by mouth 3 (three) times daily. 12/29/22 03/29/23  Carollee Herter, DO  loperamide (IMODIUM) 2 MG capsule Take 1 capsule (2 mg total) by mouth as needed for diarrhea or loose stools. Patient not taking: Reported on 03/05/2023 02/18/23   Rai, Delene Ruffini, MD  metoprolol succinate (TOPROL-XL) 25 MG 24 hr tablet Take 1 tablet (25 mg total) by mouth daily. Take with or immediately following a meal. 02/18/23   Rai, Ripudeep K, MD  NOVOLOG FLEXPEN 100 UNIT/ML FlexPen Inject 6-8 Units into the skin 3 (three) times daily with meals as needed for high blood sugar. Pt completes sliding scale. 02/18/21   [provider]      Allergies    Cephalexin, Pioglitazone, Nintedanib, and Sertraline    Review of Systems   Review of Systems  Constitutional:  Positive for fever.  HENT:  Positive for congestion and rhinorrhea. Negative for sore throat.   Eyes:  Negative for redness.  Respiratory:  Positive for cough and shortness of breath.   Cardiovascular:  Negative for chest pain and leg swelling.  Gastrointestinal:  Negative for abdominal pain, diarrhea and vomiting.  Genitourinary:  Negative for dysuria and flank pain.  Musculoskeletal:  Negative for neck pain and neck stiffness.  Skin:  Negative for rash.  Neurological:  Negative for headaches.    Physical Exam Updated Vital Signs BP (!) 164/55 (BP Location: Left Arm)   Pulse 82   Temp 97.6 F (36.4 C)   Resp 16   Ht 1.524 m (5')   Wt 49 kg    SpO2 100%   BMI 21.09 kg/m  Physical Exam Vitals and nursing note reviewed.  Constitutional:      Appearance: Normal appearance. She is well-developed.  HENT:     Head: Atraumatic.     Right Ear: Tympanic membrane normal.     Left Ear: Tympanic membrane normal.     Nose: Congestion and rhinorrhea present.     Mouth/Throat:     Mouth: Mucous membranes are moist.     Pharynx: Oropharynx is clear. No oropharyngeal exudate or posterior oropharyngeal erythema.  Eyes:     General: No scleral icterus.    Conjunctiva/sclera: Conjunctivae normal.  Neck:     Trachea: No tracheal deviation.     Comments: No stiffness or rigidity.  Cardiovascular:     Rate and Rhythm: Normal rate and regular rhythm.     Pulses: Normal pulses.     Heart sounds: Normal heart sounds. No murmur heard.    No friction rub. No gallop.  Pulmonary:     Effort: Pulmonary effort is normal. No respiratory distress.     Comments: Cough, upper resp congestion.  Abdominal:     General: There is no distension.     Palpations: Abdomen is soft.     Tenderness: There is no abdominal tenderness.  Genitourinary:    Comments: No cva tenderness.  Musculoskeletal:        General: No swelling or tenderness.     Cervical back: Normal range of motion and neck supple. No rigidity. No muscular tenderness.     Comments: Left hip surgical incision healing without sign of infection. LLE is of normal color and warmth. No calf/leg swelling/tenderness. Distal pulses palp.   Lymphadenopathy:     Cervical: No cervical adenopathy.  Skin:    General: Skin is warm and dry.     Findings: No rash.  Neurological:     Mental Status: She is alert.     Comments: Alert, speech normal.   Psychiatric:        Mood and Affect: Mood normal.     ED Results / Procedures / Treatments   Labs (all labs ordered are listed, but only abnormal results are displayed) Results for orders placed or performed during the hospital encounter of 03/12/23   CBC   Collection Time: 03/12/23  2:41 PM  Result Value Ref Range   WBC 6.0 4.0 - 10.5 K/uL   RBC 2.68 (L) 3.87 - 5.11 MIL/uL   Hemoglobin 8.2 (L) 12.0 - 15.0 g/dL   HCT 96.0 (L) 45.4 - 09.8 %   MCV 97.0 80.0 - 100.0 fL   MCH 30.6 26.0 - 34.0 pg   MCHC 31.5 30.0 - 36.0 g/dL   RDW 11.9 14.7 - 82.9 %   Platelets 320 150 - 400 K/uL  nRBC 0.0 0.0 - 0.2 %   CT HEAD WO CONTRAST ( ) Result Date: 02/14/2023 CLINICAL DATA:  Neck trauma (Age >= 65y); Head trauma, focal neuro findings (Age 54-64y) EXAM: CT HEAD WITHOUT CONTRAST CT CERVICAL SPINE WITHOUT CONTRAST TECHNIQUE: Multidetector CT imaging of the head and cervical spine was performed following the standard protocol without intravenous contrast. Multiplanar CT image reconstructions of the cervical spine were also generated. RADIATION DOSE REDUCTION: This exam was performed according to the departmental dose-optimization program which includes automated exposure control, adjustment of the mA and/or kV according to patient size and/or use of iterative reconstruction technique. COMPARISON:  CT head and C-spine 02/10/2023 FINDINGS: CT HEAD FINDINGS Brain: Cerebral ventricle sizes are concordant with the degree of cerebral volume loss. Patchy and confluent areas of decreased attenuation are noted throughout the deep and periventricular white matter of the cerebral hemispheres bilaterally, compatible with chronic microvascular ischemic disease. Similar-appearing right parieto-occipital encephalomalacia. No evidence of large-territorial acute infarction. No parenchymal hemorrhage. No mass lesion. No extra-axial collection. No mass effect or midline shift. No hydrocephalus. Basilar cisterns are patent. Vascular: No hyperdense vessel. Atherosclerotic calcifications are present within the cavernous internal carotid arteries. Skull: No acute fracture or focal lesion. Sinuses/Orbits: Paranasal sinuses and mastoid air cells are clear. The orbits are unremarkable.  Other: Left 11 mm parieto-occipital scalp hematoma formation. CT CERVICAL SPINE FINDINGS Alignment: Stable grade 1 anterolisthesis of C7 on T1. Skull base and vertebrae: C6-C7 anterior cervical discectomy and fusion surgical hardware. Multilevel moderate degenerative changes of the spine. Moderate severe osseous neural foraminal stenosis bilaterally at the C6-C7 level, left greater than right. No acute fracture. No aggressive appearing focal osseous lesion or focal pathologic process. Soft tissues and spinal canal: No prevertebral fluid or swelling. No visible canal hematoma. Upper chest: Unremarkable. Other: Atherosclerotic plaque of the aortic arch and its branches. IMPRESSION: 1. No acute intracranial abnormality. 2. No acute displaced fracture or traumatic listhesis of the cervical spine. 3.  Aortic Atherosclerosis (ICD10-I70.0). Electronically Signed   By: Tish Frederickson M.D.   On: 02/14/2023 18:06   CT CERVICAL SPINE WO CONTRAST Result Date: 02/14/2023 CLINICAL DATA:  Neck trauma (Age >= 65y); Head trauma, focal neuro findings (Age 18-64y) EXAM: CT HEAD WITHOUT CONTRAST CT CERVICAL SPINE WITHOUT CONTRAST TECHNIQUE: Multidetector CT imaging of the head and cervical spine was performed following the standard protocol without intravenous contrast. Multiplanar CT image reconstructions of the cervical spine were also generated. RADIATION DOSE REDUCTION: This exam was performed according to the departmental dose-optimization program which includes automated exposure control, adjustment of the mA and/or kV according to patient size and/or use of iterative reconstruction technique. COMPARISON:  CT head and C-spine 02/10/2023 FINDINGS: CT HEAD FINDINGS Brain: Cerebral ventricle sizes are concordant with the degree of cerebral volume loss. Patchy and confluent areas of decreased attenuation are noted throughout the deep and periventricular white matter of the cerebral hemispheres bilaterally, compatible with chronic  microvascular ischemic disease. Similar-appearing right parieto-occipital encephalomalacia. No evidence of large-territorial acute infarction. No parenchymal hemorrhage. No mass lesion. No extra-axial collection. No mass effect or midline shift. No hydrocephalus. Basilar cisterns are patent. Vascular: No hyperdense vessel. Atherosclerotic calcifications are present within the cavernous internal carotid arteries. Skull: No acute fracture or focal lesion. Sinuses/Orbits: Paranasal sinuses and mastoid air cells are clear. The orbits are unremarkable. Other: Left 11 mm parieto-occipital scalp hematoma formation. CT CERVICAL SPINE FINDINGS Alignment: Stable grade 1 anterolisthesis of C7 on T1. Skull base and vertebrae: C6-C7 anterior cervical discectomy and  fusion surgical hardware. Multilevel moderate degenerative changes of the spine. Moderate severe osseous neural foraminal stenosis bilaterally at the C6-C7 level, left greater than right. No acute fracture. No aggressive appearing focal osseous lesion or focal pathologic process. Soft tissues and spinal canal: No prevertebral fluid or swelling. No visible canal hematoma. Upper chest: Unremarkable. Other: Atherosclerotic plaque of the aortic arch and its branches. IMPRESSION: 1. No acute intracranial abnormality. 2. No acute displaced fracture or traumatic listhesis of the cervical spine. 3.  Aortic Atherosclerosis (ICD10-I70.0). Electronically Signed   By: Tish Frederickson M.D.   On: 02/14/2023 18:06   CT HIP LEFT WO CONTRAST Result Date: 02/14/2023 CLINICAL DATA:  Fall with injury after recent repair EXAM: CT OF THE LEFT HIP WITHOUT CONTRAST TECHNIQUE: Multidetector CT imaging of the left hip was performed according to the standard protocol. Multiplanar CT image reconstructions were also generated. RADIATION DOSE REDUCTION: This exam was performed according to the departmental dose-optimization program which includes automated exposure control, adjustment of the mA  and/or kV according to patient size and/or use of iterative reconstruction technique. COMPARISON:  X-ray intraoperative left hip 02/11/2023, x-ray pelvis 02/10/2023 FINDINGS: Bones/Joint/Cartilage Total left hip arthroplasty with periprosthetic minimally displaced acute intertrochanteric fracture. Ligaments Suboptimally assessed by CT. Muscles and Tendons Subcutaneus soft tissue edema and emphysema along the left hip and proximal thigh soft tissues and musculature likely postsurgical in etiology. Soft tissues Subcutaneus soft tissue edema and emphysema along the left hip and proximal thigh soft tissues and musculature likely postsurgical in etiology. Left gluteal, hip, proximal thigh hematoma formation along the posterolateral aspect. Overlying skin staples. Other: Atherosclerotic plaque. IMPRESSION: 1. Total left hip arthroplasty with periprosthetic minimally displaced acute intertrochanteric fracture. 2. Subcutaneus soft tissue edema and emphysema along the left hip and proximal thigh soft tissues and musculature likely postsurgical in etiology. Differential diagnosis includes infection. Electronically Signed   By: Tish Frederickson M.D.   On: 02/14/2023 17:53   DG HIP UNILAT WITH PELVIS 1V LEFT Result Date: 02/11/2023 CLINICAL DATA:  Elective surgery. EXAM: DG HIP (WITH OR WITHOUT PELVIS) 1V*L* COMPARISON:  Preoperative imaging FINDINGS: Three fluoroscopic spot views of the pelvis and left hip obtained in the operating room. Images during hip arthroplasty. Fluoroscopy time 12 seconds. Dose 0.636 mGy. IMPRESSION: Procedural fluoroscopy during left hip arthroplasty. Electronically Signed   By: Narda Rutherford M.D.   On: 02/11/2023 13:29   DG C-Arm 1-60 Min-No Report Result Date: 02/11/2023 Fluoroscopy was utilized by the requesting physician.  No radiographic interpretation.   CT HIP LEFT WO CONTRAST Result Date: 02/10/2023 CLINICAL DATA:  Fall with left hip pain EXAM: CT OF THE LEFT HIP WITHOUT CONTRAST  TECHNIQUE: Multidetector CT imaging of the left hip was performed according to the standard protocol. Multiplanar CT image reconstructions were also generated. RADIATION DOSE REDUCTION: This exam was performed according to the departmental dose-optimization program which includes automated exposure control, adjustment of the mA and/or kV according to patient size and/or use of iterative reconstruction technique. COMPARISON:  02/10/2023 FINDINGS: Bones/Joint/Cartilage Acute mildly impacted and comminuted sub capital femoral neck fracture with mild apex anterior angulation. No femoral head dislocation. Pubic symphysis and rami appear intact. No SI joint widening. Ligaments Suboptimally assessed by CT. Muscles and Tendons No intramuscular fluid collections.  No significant atrophy Soft tissues Soft tissue edema about the left hip. Vascular calcifications in the pelvis. IMPRESSION: Acute mildly impacted and comminuted subcapital femoral neck fracture with mild apex anterior and medial angulation. Electronically Signed   By: Selena Batten  Jake Samples M.D.   On: 02/10/2023 21:23   DG Pelvis 1-2 Views Result Date: 02/10/2023 CLINICAL DATA:  Fall EXAM: PELVIS - 1-2 VIEW COMPARISON:  CT 12/13/2022 FINDINGS: Acute left femoral neck fracture. No femoral head dislocation. Pubic symphysis and rami appear intact. Mild to moderate hip degenerative changes. Vascular calcifications IMPRESSION: Acute left femoral neck fracture. Electronically Signed   By: Jasmine Pang M.D.   On: 02/10/2023 19:56   DG Chest 1 View Result Date: 02/10/2023 CLINICAL DATA:  Fall with hip pain EXAM: CHEST  1 VIEW COMPARISON:  12/27/2022 FINDINGS: Hardware in the cervical spine. Low lung volumes. Chronic interstitial opacities. Linear areas of atelectasis or possible scarring at the lung bases. Stable cardiomediastinal silhouette with aortic atherosclerosis. No pneumothorax IMPRESSION: Low lung volumes with chronic interstitial opacities and linear areas of  atelectasis or possible scarring at the lung bases. Electronically Signed   By: Jasmine Pang M.D.   On: 02/10/2023 19:55   DG Knee 1-2 Views Left Result Date: 02/10/2023 CLINICAL DATA:  Fall with pain EXAM: LEFT KNEE - 1-2 VIEW COMPARISON:  None Available. FINDINGS: Limited by positioning. Sclerosis in the distal shaft and metaphysis of the femur suggestive of an infarct. No acute fracture or malalignment. No sizeable effusion IMPRESSION: 1. No acute osseous abnormality. Electronically Signed   By: Jasmine Pang M.D.   On: 02/10/2023 19:54   DG FEMUR MIN 2 VIEWS LEFT Result Date: 02/10/2023 CLINICAL DATA:  Fall left hip and sacral pain EXAM: LEFT FEMUR 2 VIEWS COMPARISON:  None Available. FINDINGS: Sclerosis in the distal shaft of the femur which may be due to infarct. Acute left femoral neck fracture. No femoral head dislocation. Vascular calcifications. IMPRESSION: Acute left femoral neck fracture. Electronically Signed   By: Jasmine Pang M.D.   On: 02/10/2023 19:53   CT HEAD WO CONTRAST Result Date: 02/10/2023 CLINICAL DATA:  Head and neck trauma from fall EXAM: CT HEAD WITHOUT CONTRAST CT CERVICAL SPINE WITHOUT CONTRAST TECHNIQUE: Multidetector CT imaging of the head and cervical spine was performed following the standard protocol without intravenous contrast. Multiplanar CT image reconstructions of the cervical spine were also generated. RADIATION DOSE REDUCTION: This exam was performed according to the departmental dose-optimization program which includes automated exposure control, adjustment of the mA and/or kV according to patient size and/or use of iterative reconstruction technique. COMPARISON:  CT and MRI 12/28/2022 and CT 11/15/2017 FINDINGS: CT HEAD FINDINGS Brain: No intracranial hemorrhage, mass effect, or evidence of acute infarct. No hydrocephalus. No extra-axial fluid collection. Age related cerebral atrophy and chronic small vessel ischemic disease. Chronic right occipital infarct.  Vascular: No hyperdense vessel. Intracranial arterial calcification. Skull: No fracture or focal lesion. Sinuses/Orbits: No acute finding. Other: None. CT CERVICAL SPINE FINDINGS Alignment: No evidence of traumatic malalignment. Skull base and vertebrae: No acute fracture. No primary bone lesion or focal pathologic process. Soft tissues and spinal canal: No prevertebral fluid or swelling. No visible canal hematoma. Disc levels: Unchanged multilevel spondylosis greatest at C5-C6. ACDF C6-C7. Mild spinal canal narrowing at C3-C4, C5-C6 and C6-C7 secondary to posterior disc osteophyte complexes. No severe spinal canal narrowing. Multilevel facet arthropathy. Upper chest: No acute abnormality. Other: Carotid calcification. IMPRESSION: 1. No acute intracranial abnormality. 2. No acute fracture in the cervical spine. Electronically Signed   By: Minerva Fester M.D.   On: 02/10/2023 19:47   CT CERVICAL SPINE WO CONTRAST Result Date: 02/10/2023 CLINICAL DATA:  Head and neck trauma from fall EXAM: CT HEAD WITHOUT CONTRAST CT CERVICAL  SPINE WITHOUT CONTRAST TECHNIQUE: Multidetector CT imaging of the head and cervical spine was performed following the standard protocol without intravenous contrast. Multiplanar CT image reconstructions of the cervical spine were also generated. RADIATION DOSE REDUCTION: This exam was performed according to the departmental dose-optimization program which includes automated exposure control, adjustment of the mA and/or kV according to patient size and/or use of iterative reconstruction technique. COMPARISON:  CT and MRI 12/28/2022 and CT 11/15/2017 FINDINGS: CT HEAD FINDINGS Brain: No intracranial hemorrhage, mass effect, or evidence of acute infarct. No hydrocephalus. No extra-axial fluid collection. Age related cerebral atrophy and chronic small vessel ischemic disease. Chronic right occipital infarct. Vascular: No hyperdense vessel. Intracranial arterial calcification. Skull: No fracture  or focal lesion. Sinuses/Orbits: No acute finding. Other: None. CT CERVICAL SPINE FINDINGS Alignment: No evidence of traumatic malalignment. Skull base and vertebrae: No acute fracture. No primary bone lesion or focal pathologic process. Soft tissues and spinal canal: No prevertebral fluid or swelling. No visible canal hematoma. Disc levels: Unchanged multilevel spondylosis greatest at C5-C6. ACDF C6-C7. Mild spinal canal narrowing at C3-C4, C5-C6 and C6-C7 secondary to posterior disc osteophyte complexes. No severe spinal canal narrowing. Multilevel facet arthropathy. Upper chest: No acute abnormality. Other: Carotid calcification. IMPRESSION: 1. No acute intracranial abnormality. 2. No acute fracture in the cervical spine. Electronically Signed   By: Minerva Fester M.D.   On: 02/10/2023 19:47    EKG EKG Interpretation Date/Time:  Friday March 12 2023 13:45:23 EST Ventricular Rate:  84 PR Interval:  156 QRS Duration:  90 QT Interval:  400 QTC Calculation: 476 R Axis:   14  Text Interpretation: Sinus rhythm Non-specific ST-t changes Baseline wander Confirmed by Cathren Laine ([id number]) on 03/12/2023 1:47:20 PM  Radiology No results found.  Procedures Procedures    Medications Ordered in ED Medications  albuterol (VENTOLIN HFA) 108 (90 Base) MCG/ACT inhaler 2 puff (has no administration in time range)    ED Course/ Medical Decision Making/ A&P                                 Medical Decision Making Problems Addressed: Persistent cough: acute illness or injury URI with cough and congestion: acute illness or injury with systemic symptoms  Amount and/or Complexity of Data Reviewed Independent Historian:     Details: Family, hx External Data Reviewed: notes. Labs: ordered. Decision-making details documented in ED Course. Radiology: ordered and independent interpretation performed. Decision-making details documented in ED Course.  Risk Prescription drug management. Decision  regarding hospitalization.   Iv ns. Continuous pulse ox and cardiac monitoring. Labs ordered/sent. Imaging ordered.   Differential diagnosis includes pna, covid, flu, etc. Dispo decision including potential need for admission considered - will get labs and imaging and reassess.   Reviewed nursing notes and prior charts for additional history. External reports reviewed. Additional history from: family.   Cardiac monitor: sinus rhythm, rate 80.  Labs reviewed/interpreted by me - pnd,  Xrays reviewed/interpreted by me - pnd  1500, labs and imaging pending - signed out to Dr Criss Alvine to f/u on pending studies, recheck, and dispo appropriately.          Final Clinical Impression(s) / ED Diagnoses Final diagnoses:  None    Rx / DC Orders ED Discharge Orders     None         Cathren Laine, MD 03/12/23 1504

## 2023-03-12 NOTE — ED Provider Notes (Signed)
Care transferred to me.  COVID/flu/RSV testing is negative.  BNP is elevated but in a nonspecific range and very similar to many prior values.  I do not think this represents CHF.  Family and patient report there has been some wheezing but patient does not know how to use an inhaler well.  Will give her a spacer from [facility] and advised her how to use this and have her follow-up with PCP.  Likely a cough continued from a previous infection.  I do not think new antibiotics will help.  Will discharge home with return precautions.   Pricilla Loveless, MD 03/12/23 [phone]

## 2023-03-12 NOTE — ED Triage Notes (Addendum)
Pt POV slow gait with personal walker, accompanied by grandson.   Pt recently d/c from rehab p fall and L hip replacement. Reports ongoing productive cough, congestion x2 weeks. C/o increased shortness of breath. Also reports swelling to L ankle.  Pt reports recent antibiotic use, unsure to timeline.

## 2023-03-12 NOTE — ED Notes (Signed)
Patient having trouble with MDI at home. I gave her a spacer, and instructed her on how to use it. Family at bedside.

## 2023-03-12 NOTE — Discharge Instructions (Addendum)
It was our pleasure to provide your ER care today - we hope that you feel better.  Drink plenty of fluids/stay well hydrated.  You may try nyquil, mucinex, robitussin or similar over the counter cold and flu medication for symptom relief.   Follow up with primary care doctor in two weeks if symptoms fail to improve/resolve.  Return to ER if worse, new symptoms, increased trouble breathing, or other emergency concern.

## 2023-03-15 DIAGNOSIS — N184 Chronic kidney disease, stage 4 (severe): Secondary | ICD-10-CM | POA: Diagnosis not present

## 2023-03-15 DIAGNOSIS — E11319 Type 2 diabetes mellitus with unspecified diabetic retinopathy without macular edema: Secondary | ICD-10-CM | POA: Diagnosis not present

## 2023-03-16 DIAGNOSIS — J849 Interstitial pulmonary disease, unspecified: Secondary | ICD-10-CM | POA: Diagnosis not present

## 2023-03-16 DIAGNOSIS — I129 Hypertensive chronic kidney disease with stage 1 through stage 4 chronic kidney disease, or unspecified chronic kidney disease: Secondary | ICD-10-CM | POA: Diagnosis not present

## 2023-03-16 DIAGNOSIS — E1122 Type 2 diabetes mellitus with diabetic chronic kidney disease: Secondary | ICD-10-CM | POA: Diagnosis not present

## 2023-03-16 DIAGNOSIS — I2510 Atherosclerotic heart disease of native coronary artery without angina pectoris: Secondary | ICD-10-CM | POA: Diagnosis not present

## 2023-03-16 DIAGNOSIS — M1990 Unspecified osteoarthritis, unspecified site: Secondary | ICD-10-CM | POA: Diagnosis not present

## 2023-03-16 DIAGNOSIS — N183 Chronic kidney disease, stage 3 unspecified: Secondary | ICD-10-CM | POA: Diagnosis not present

## 2023-03-16 DIAGNOSIS — I420 Dilated cardiomyopathy: Secondary | ICD-10-CM | POA: Diagnosis not present

## 2023-03-16 DIAGNOSIS — E113599 Type 2 diabetes mellitus with proliferative diabetic retinopathy without macular edema, unspecified eye: Secondary | ICD-10-CM | POA: Diagnosis not present

## 2023-03-16 DIAGNOSIS — M80052D Age-related osteoporosis with current pathological fracture, left femur, subsequent encounter for fracture with routine healing: Secondary | ICD-10-CM | POA: Diagnosis not present

## 2023-03-17 DIAGNOSIS — I420 Dilated cardiomyopathy: Secondary | ICD-10-CM | POA: Diagnosis not present

## 2023-03-17 DIAGNOSIS — I129 Hypertensive chronic kidney disease with stage 1 through stage 4 chronic kidney disease, or unspecified chronic kidney disease: Secondary | ICD-10-CM | POA: Diagnosis not present

## 2023-03-17 DIAGNOSIS — M80052D Age-related osteoporosis with current pathological fracture, left femur, subsequent encounter for fracture with routine healing: Secondary | ICD-10-CM | POA: Diagnosis not present

## 2023-03-17 DIAGNOSIS — E1122 Type 2 diabetes mellitus with diabetic chronic kidney disease: Secondary | ICD-10-CM | POA: Diagnosis not present

## 2023-03-17 DIAGNOSIS — M1990 Unspecified osteoarthritis, unspecified site: Secondary | ICD-10-CM | POA: Diagnosis not present

## 2023-03-17 DIAGNOSIS — E113599 Type 2 diabetes mellitus with proliferative diabetic retinopathy without macular edema, unspecified eye: Secondary | ICD-10-CM | POA: Diagnosis not present

## 2023-03-17 DIAGNOSIS — J849 Interstitial pulmonary disease, unspecified: Secondary | ICD-10-CM | POA: Diagnosis not present

## 2023-03-17 DIAGNOSIS — I2510 Atherosclerotic heart disease of native coronary artery without angina pectoris: Secondary | ICD-10-CM | POA: Diagnosis not present

## 2023-03-17 DIAGNOSIS — N183 Chronic kidney disease, stage 3 unspecified: Secondary | ICD-10-CM | POA: Diagnosis not present

## 2023-03-19 DIAGNOSIS — I2510 Atherosclerotic heart disease of native coronary artery without angina pectoris: Secondary | ICD-10-CM | POA: Diagnosis not present

## 2023-03-19 DIAGNOSIS — E1122 Type 2 diabetes mellitus with diabetic chronic kidney disease: Secondary | ICD-10-CM | POA: Diagnosis not present

## 2023-03-19 DIAGNOSIS — M1990 Unspecified osteoarthritis, unspecified site: Secondary | ICD-10-CM | POA: Diagnosis not present

## 2023-03-19 DIAGNOSIS — I420 Dilated cardiomyopathy: Secondary | ICD-10-CM | POA: Diagnosis not present

## 2023-03-19 DIAGNOSIS — I129 Hypertensive chronic kidney disease with stage 1 through stage 4 chronic kidney disease, or unspecified chronic kidney disease: Secondary | ICD-10-CM | POA: Diagnosis not present

## 2023-03-19 DIAGNOSIS — E113599 Type 2 diabetes mellitus with proliferative diabetic retinopathy without macular edema, unspecified eye: Secondary | ICD-10-CM | POA: Diagnosis not present

## 2023-03-19 DIAGNOSIS — N183 Chronic kidney disease, stage 3 unspecified: Secondary | ICD-10-CM | POA: Diagnosis not present

## 2023-03-19 DIAGNOSIS — J849 Interstitial pulmonary disease, unspecified: Secondary | ICD-10-CM | POA: Diagnosis not present

## 2023-03-19 DIAGNOSIS — M80052D Age-related osteoporosis with current pathological fracture, left femur, subsequent encounter for fracture with routine healing: Secondary | ICD-10-CM | POA: Diagnosis not present

## 2023-03-22 ENCOUNTER — Ambulatory Visit: Payer: Medicare HMO | Admitting: *Deleted

## 2023-03-22 ENCOUNTER — Other Ambulatory Visit: Payer: Self-pay | Admitting: Family

## 2023-03-22 ENCOUNTER — Telehealth: Payer: Self-pay | Admitting: Neurology

## 2023-03-22 DIAGNOSIS — L299 Pruritus, unspecified: Secondary | ICD-10-CM

## 2023-03-22 NOTE — Telephone Encounter (Signed)
 Edited to add: EEG tech Tresa Endo states she could not complete due to pt being unable to hold still, slurring her words, etc. She advised pt's daughter to take her to the hospital but pt refused. Sending as urgent per recommendation from [place].

## 2023-03-22 NOTE — Telephone Encounter (Signed)
 Called the daughter back. She came in for EEG today but was unable to complete it. She just got out of inpatient rehab few weeks ago from fracturing her hip. She was at home and she has had difficulty with getting her speech out, slurring words and stuttering. Looking at her daughter asking where is [name], which is her daughter. She states that he has witnessed TIA's before. She asked if she should take the pt to ER. Advised the patient's daughter that as they have been educated before with previous TIA, you are suppose to treat the new symptoms as a a emergency as it could be a stroke but hard to know without getting work up completed. She verbalized understanding. I did advise I would let Shanda Bumps NP know that she is going to ER.

## 2023-03-22 NOTE — Telephone Encounter (Signed)
 Pt's daughter [name] is requesting a call to discuss worsening symptoms, [phone] # [phone]

## 2023-03-22 NOTE — Telephone Encounter (Signed)
 She has been having multiple recurrent episodes of slurred speech with prior full workup unremarkable, this was the reason for EEG. Would agree with ED evaluation if symptoms have persisted. Would recommend rescheduling to complete EEG once able. Thank you.

## 2023-03-22 NOTE — Telephone Encounter (Signed)
 Requested mediation was not on active medication list

## 2023-03-23 ENCOUNTER — Other Ambulatory Visit: Payer: Self-pay

## 2023-03-23 MED ORDER — HYDROXYZINE HCL 10 MG PO TABS: 10.0000 mg | ORAL_TABLET | Freq: Three times a day (TID) | ORAL | 3 refills | Status: DC | PRN

## 2023-03-23 NOTE — Telephone Encounter (Signed)
Hydroxyzine refilled

## 2023-03-23 NOTE — Telephone Encounter (Signed)
 Patient's daughter called this afternoon because the patient is having been itching and daughter said that she call the pharmacy to refill the hydrOXYzine (ATARAX) 10 MG tablet but the medication was refused on yesterday because the medication is not on the current list.  Patient is asking Ngetich, Donalee Citrin, NP for another refill to the [facility] on [address].  Please Advise  Message sent to Ngetich, Donalee Citrin, NP

## 2023-03-23 NOTE — Telephone Encounter (Signed)
 Patients daughter called because she is at the pharmacy and questioned if her message had been addressed yet. Selena Batten would like a call once request complete

## 2023-03-24 ENCOUNTER — Other Ambulatory Visit: Payer: Medicare HMO | Admitting: Gastroenterology

## 2023-03-24 DIAGNOSIS — E113599 Type 2 diabetes mellitus with proliferative diabetic retinopathy without macular edema, unspecified eye: Secondary | ICD-10-CM | POA: Diagnosis not present

## 2023-03-24 DIAGNOSIS — I2510 Atherosclerotic heart disease of native coronary artery without angina pectoris: Secondary | ICD-10-CM | POA: Diagnosis not present

## 2023-03-24 DIAGNOSIS — I129 Hypertensive chronic kidney disease with stage 1 through stage 4 chronic kidney disease, or unspecified chronic kidney disease: Secondary | ICD-10-CM | POA: Diagnosis not present

## 2023-03-24 DIAGNOSIS — N183 Chronic kidney disease, stage 3 unspecified: Secondary | ICD-10-CM | POA: Diagnosis not present

## 2023-03-24 DIAGNOSIS — I420 Dilated cardiomyopathy: Secondary | ICD-10-CM | POA: Diagnosis not present

## 2023-03-24 DIAGNOSIS — M80052D Age-related osteoporosis with current pathological fracture, left femur, subsequent encounter for fracture with routine healing: Secondary | ICD-10-CM | POA: Diagnosis not present

## 2023-03-24 DIAGNOSIS — E1122 Type 2 diabetes mellitus with diabetic chronic kidney disease: Secondary | ICD-10-CM | POA: Diagnosis not present

## 2023-03-24 DIAGNOSIS — J849 Interstitial pulmonary disease, unspecified: Secondary | ICD-10-CM | POA: Diagnosis not present

## 2023-03-24 DIAGNOSIS — M1990 Unspecified osteoarthritis, unspecified site: Secondary | ICD-10-CM | POA: Diagnosis not present

## 2023-03-24 NOTE — Telephone Encounter (Signed)
 Noted.

## 2023-03-24 NOTE — Telephone Encounter (Signed)
 Patient daughter is aware and will pick it up at the pharmacy.  Message sent to Ngetich, Donalee Citrin, NP

## 2023-03-25 ENCOUNTER — Other Ambulatory Visit: Payer: Self-pay

## 2023-03-25 ENCOUNTER — Emergency Department (HOSPITAL_BASED_OUTPATIENT_CLINIC_OR_DEPARTMENT_OTHER)
Admission: EM | Admit: 2023-03-25 | Discharge: 2023-03-25 | Disposition: A | Discharge status: Home / Self Care | Attending: Emergency Medicine | Admitting: Emergency Medicine

## 2023-03-25 ENCOUNTER — Emergency Department (HOSPITAL_BASED_OUTPATIENT_CLINIC_OR_DEPARTMENT_OTHER)

## 2023-03-25 ENCOUNTER — Encounter (HOSPITAL_BASED_OUTPATIENT_CLINIC_OR_DEPARTMENT_OTHER): Payer: Self-pay

## 2023-03-25 DIAGNOSIS — Z794 Long term (current) use of insulin: Secondary | ICD-10-CM | POA: Diagnosis not present

## 2023-03-25 DIAGNOSIS — Z8673 Personal history of transient ischemic attack (TIA), and cerebral infarction without residual deficits: Secondary | ICD-10-CM | POA: Diagnosis not present

## 2023-03-25 DIAGNOSIS — R9431 Abnormal electrocardiogram [ECG] [EKG]: Secondary | ICD-10-CM | POA: Diagnosis not present

## 2023-03-25 DIAGNOSIS — E1122 Type 2 diabetes mellitus with diabetic chronic kidney disease: Secondary | ICD-10-CM | POA: Diagnosis not present

## 2023-03-25 DIAGNOSIS — B3741 Candidal cystitis and urethritis: Secondary | ICD-10-CM | POA: Insufficient documentation

## 2023-03-25 DIAGNOSIS — N189 Chronic kidney disease, unspecified: Secondary | ICD-10-CM | POA: Diagnosis not present

## 2023-03-25 DIAGNOSIS — I2510 Atherosclerotic heart disease of native coronary artery without angina pectoris: Secondary | ICD-10-CM | POA: Insufficient documentation

## 2023-03-25 DIAGNOSIS — I129 Hypertensive chronic kidney disease with stage 1 through stage 4 chronic kidney disease, or unspecified chronic kidney disease: Secondary | ICD-10-CM | POA: Diagnosis not present

## 2023-03-25 DIAGNOSIS — R4182 Altered mental status, unspecified: Secondary | ICD-10-CM | POA: Insufficient documentation

## 2023-03-25 DIAGNOSIS — Z7902 Long term (current) use of antithrombotics/antiplatelets: Secondary | ICD-10-CM | POA: Insufficient documentation

## 2023-03-25 DIAGNOSIS — I6782 Cerebral ischemia: Secondary | ICD-10-CM | POA: Diagnosis not present

## 2023-03-25 DIAGNOSIS — Z79899 Other long term (current) drug therapy: Secondary | ICD-10-CM | POA: Diagnosis not present

## 2023-03-25 DIAGNOSIS — R9389 Abnormal findings on diagnostic imaging of other specified body structures: Secondary | ICD-10-CM | POA: Diagnosis not present

## 2023-03-25 DIAGNOSIS — N3000 Acute cystitis without hematuria: Secondary | ICD-10-CM

## 2023-03-25 LAB — CBC
HCT: 28.2 % — ABNORMAL LOW (ref 36.0–46.0)
Hemoglobin: 8.8 g/dL — ABNORMAL LOW (ref 12.0–15.0)
MCH: 30.6 pg (ref 26.0–34.0)
MCHC: 31.2 g/dL (ref 30.0–36.0)
MCV: 97.9 fL (ref 80.0–100.0)
Platelets: 226 10*3/uL (ref 150–400)
RBC: 2.88 MIL/uL — ABNORMAL LOW (ref 3.87–5.11)
RDW: 14 % (ref 11.5–15.5)
WBC: 5.9 10*3/uL (ref 4.0–10.5)
nRBC: 0 % (ref 0.0–0.2)

## 2023-03-25 LAB — URINALYSIS, ROUTINE W REFLEX MICROSCOPIC
Bilirubin Urine: NEGATIVE
Glucose, UA: 250 mg/dL — AB
Hgb urine dipstick: NEGATIVE
Ketones, ur: NEGATIVE mg/dL
Nitrite: NEGATIVE
Protein, ur: 100 mg/dL — AB
Specific Gravity, Urine: 1.025 (ref 1.005–1.030)
pH: 5.5 (ref 5.0–8.0)

## 2023-03-25 LAB — COMPREHENSIVE METABOLIC PANEL
ALT: 14 U/L (ref 0–44)
AST: 17 U/L (ref 15–41)
Albumin: 3.3 g/dL — ABNORMAL LOW (ref 3.5–5.0)
Alkaline Phosphatase: 127 U/L — ABNORMAL HIGH (ref 38–126)
Anion gap: 8 (ref 5–15)
BUN: 39 mg/dL — ABNORMAL HIGH (ref 8–23)
CO2: 20 mmol/L — ABNORMAL LOW (ref 22–32)
Calcium: 9.1 mg/dL (ref 8.9–10.3)
Chloride: 112 mmol/L — ABNORMAL HIGH (ref 98–111)
Creatinine, Ser: 2.02 mg/dL — ABNORMAL HIGH (ref 0.44–1.00)
GFR, Estimated: 23 mL/min — ABNORMAL LOW (ref >60)
Glucose, Bld: 246 mg/dL — ABNORMAL HIGH (ref 70–99)
Potassium: 4.7 mmol/L (ref 3.5–5.1)
Sodium: 140 mmol/L (ref 135–145)
Total Bilirubin: 0.6 mg/dL (ref 0.0–1.2)
Total Protein: 7 g/dL (ref 6.5–8.1)

## 2023-03-25 LAB — URINALYSIS, MICROSCOPIC (REFLEX)

## 2023-03-25 LAB — RESP PANEL BY RT-PCR (RSV, FLU A&B, COVID)  RVPGX2
Influenza A by PCR: NEGATIVE
Influenza B by PCR: NEGATIVE
Resp Syncytial Virus by PCR: NEGATIVE
SARS Coronavirus 2 by RT PCR: NEGATIVE

## 2023-03-25 MED ORDER — FOSFOMYCIN TROMETHAMINE 3 G PO PACK
3.0000 g | PACK | Freq: Once | ORAL | Status: AC
  Administered 2023-03-25: 3 g via ORAL
  Filled 2023-03-25: qty 3

## 2023-03-25 MED ORDER — HYDRALAZINE HCL 25 MG PO TABS
25.0000 mg | ORAL_TABLET | Freq: Once | ORAL | Status: AC
  Administered 2023-03-25: 25 mg via ORAL
  Filled 2023-03-25: qty 1

## 2023-03-25 MED ORDER — SODIUM CHLORIDE 0.9 % IV SOLN
1.0000 g | Freq: Once | INTRAVENOUS | Status: DC
  Filled 2023-03-25: qty 10

## 2023-03-25 MED ORDER — FLUCONAZOLE 200 MG PO TABS: 200.0000 mg | ORAL_TABLET | Freq: Every day | ORAL | 0 refills | Status: AC

## 2023-03-25 NOTE — ED Provider Notes (Signed)
 [facility] Provider Note   CSN: [id number] Arrival date & time: 03/25/23  1222     History  Chief Complaint  Patient presents with   Altered Mental Status    Sandra [name] is a 89 y.o. female with hx of HLD, HTN, CVA, T2DM, CAD, cognitive impairment, diabetic retinopathy, CKD, focal motor seizure disorder, takotsubo cardiomyopathy, multiple brain bleeds, who presents to the ER with daughter with concern for altered mental status. Per daughter, pt has had worsening AMS x 2 days. She has noticed pt has been slurring her words and has increasing confusion. Daughter feels like pt has a UTI. No recent falls at home. Pt describes feeling "out of it".   Pt ambulates with walker at baseline. Daughter reports patient has not had her afternoon dose of blood pressure medicine.    Altered Mental Status Presenting symptoms: confusion        Home Medications Prior to Admission medications   Medication Sig Start Date End Date Taking? Authorizing Provider  fluconazole (DIFLUCAN) 200 MG tablet Take 1 tablet (200 mg total) by mouth daily for 14 days. 03/25/23 04/08/23 Yes Norbert Malkin T, PA-C  acetaminophen (TYLENOL) 500 MG tablet Take 500 mg by mouth every 6 (six) hours as needed for mild pain (pain score 1-3) or headache.    [provider]  albuterol (VENTOLIN HFA) 108 (90 Base) MCG/ACT inhaler Inhale 2 puffs into the lungs every 6 (six) hours as needed for wheezing or shortness of breath. 03/05/23   Medina-Vargas, Monina C, NP  atorvastatin (LIPITOR) 80 MG tablet TAKE 1 TABLET EVERY DAY Patient taking differently: Take 80 mg by mouth every evening. 11/26/22   Ngetich, Dinah C, NP  citalopram (CELEXA) 10 MG tablet Take 1 tablet (10 mg total) by mouth daily. 03/03/23   Ngetich, Dinah C, NP  clonazePAM (KLONOPIN) 0.5 MG tablet Take 1 tablet (0.5 mg total) by mouth 2 (two) times daily as needed for anxiety. 03/03/23   Ngetich, Dinah C, NP   clopidogrel (PLAVIX) 75 MG tablet TAKE 1 TABLET EVERY DAY 02/10/23   Ngetich, Dinah C, NP  famotidine (PEPCID) 40 MG tablet TAKE 1 TABLET EVERY DAY 02/05/23   Ngetich, Dinah C, NP  hydrALAZINE (APRESOLINE) 25 MG tablet Take 1 tablet (25 mg total) by mouth 3 (three) times daily. 12/29/22 03/29/23  Carollee Herter, DO  hydrOXYzine (ATARAX) 10 MG tablet Take 1 tablet (10 mg total) by mouth 3 (three) times daily as needed. 03/23/23   Ngetich, Dinah C, NP  loperamide (IMODIUM) 2 MG capsule Take 1 capsule (2 mg total) by mouth as needed for diarrhea or loose stools. Patient not taking: Reported on 03/05/2023 02/18/23   Rai, Delene Ruffini, MD  metoprolol succinate (TOPROL-XL) 25 MG 24 hr tablet Take 1 tablet (25 mg total) by mouth daily. Take with or immediately following a meal. 02/18/23   Rai, Ripudeep K, MD  NOVOLOG FLEXPEN 100 UNIT/ML FlexPen Inject 6-8 Units into the skin 3 (three) times daily with meals as needed for high blood sugar. Pt completes sliding scale. 02/18/21   [provider]      Allergies    Cephalexin, Pioglitazone, Nintedanib, and Sertraline    Review of Systems   Review of Systems  Constitutional:  Positive for fatigue.  Musculoskeletal:  Positive for back pain.  Psychiatric/Behavioral:  Positive for confusion.   All other systems reviewed and are negative.   Physical Exam Updated Vital Signs BP (!) 173/46 (BP Location:  Right Arm)   Pulse 72   Temp 97.7 F (36.5 C) (Axillary)   Resp 14   Ht 5' (1.524 m)   Wt 49 kg   SpO2 99%   BMI 21.10 kg/m  Physical Exam Vitals and nursing note reviewed.  Constitutional:      Appearance: Normal appearance.  HENT:     Head: Normocephalic and atraumatic.  Eyes:     Conjunctiva/sclera: Conjunctivae normal.  Cardiovascular:     Rate and Rhythm: Normal rate and regular rhythm.  Pulmonary:     Effort: Pulmonary effort is normal. No respiratory distress.     Breath sounds: Normal breath sounds.  Abdominal:     General: There is no  distension.     Palpations: Abdomen is soft.     Tenderness: There is no abdominal tenderness.  Skin:    General: Skin is warm and dry.     Coloration: Skin is pale.  Neurological:     General: No focal deficit present.     Mental Status: She is alert.     Comments: Neuro: Speech is clear, able to follow commands. CN III-XII intact grossly intact. PERRLA. EOMI. Sensation intact throughout. Str intact in all extremities.     ED Results / Procedures / Treatments   Labs (all labs ordered are listed, but only abnormal results are displayed) Labs Reviewed  COMPREHENSIVE METABOLIC PANEL - Abnormal; Notable for the following components:      Result Value   Chloride 112 (*)    CO2 20 (*)    Glucose, Bld 246 (*)    BUN 39 (*)    Creatinine, Ser 2.02 (*)    Albumin 3.3 (*)    Alkaline Phosphatase 127 (*)    GFR, Estimated 23 (*)    All other components within normal limits  CBC - Abnormal; Notable for the following components:   RBC 2.88 (*)    Hemoglobin 8.8 (*)    HCT 28.2 (*)    All other components within normal limits  URINALYSIS, ROUTINE W REFLEX MICROSCOPIC - Abnormal; Notable for the following components:   APPearance CLOUDY (*)    Glucose, UA 250 (*)    Protein, ur 100 (*)    Leukocytes,Ua SMALL (*)    All other components within normal limits  URINALYSIS, MICROSCOPIC (REFLEX) - Abnormal; Notable for the following components:   Bacteria, UA FEW (*)    All other components within normal limits  RESP PANEL BY RT-PCR (RSV, FLU A&B, COVID)  RVPGX2  CBG MONITORING, ED    EKG EKG Interpretation Date/Time:  Thursday March 25 2023 13:07:07 EST Ventricular Rate:  77 PR Interval:  138 QRS Duration:  94 QT Interval:  388 QTC Calculation: 440 R Axis:   13  Text Interpretation: Sinus rhythm no acute ST/T changes Confirmed by Pricilla Loveless [phone]) on 03/25/2023 2:31:16 PM  Radiology DG Chest 2 View Result Date: 03/25/2023 CLINICAL DATA:  Altered mental status.  Infection  workup EXAM: CHEST - 2 VIEW COMPARISON:  X-ray 03/12/2023 FINDINGS: Underinflation with elevated right hemidiaphragm. No consolidation, pneumothorax or effusion. No edema. Stable cardiopericardial silhouette. Interstitial changes are again seen and could be chronic. Fixation hardware seen along the lower cervical spine at the edge of the imaging field. Osteopenia with degenerative changes along the spine. Overlapping cardiac leads. IMPRESSION: Elevated right hemidiaphragm. Underinflation. Likely chronic interstitial changes. Electronically Signed   By: Karen Kays M.D.   On: 03/25/2023 16:06   CT Head Wo Contrast Result  Date: 03/25/2023 CLINICAL DATA:  Worsening altered mental status for 2 days. Slurred speech. EXAM: CT HEAD WITHOUT CONTRAST TECHNIQUE: Contiguous axial images were obtained from the base of the skull through the vertex without intravenous contrast. RADIATION DOSE REDUCTION: This exam was performed according to the departmental dose-optimization program which includes automated exposure control, adjustment of the mA and/or kV according to patient size and/or use of iterative reconstruction technique. COMPARISON:  CT head 02/14/2023. FINDINGS: Brain: No acute intracranial hemorrhage. No CT evidence of acute infarct. Nonspecific hypoattenuation in the periventricular and subcortical white matter favored to reflect chronic microvascular ischemic changes. Remote lacunar infarct in the left basal ganglia. Similar appearance of encephalomalacia in the right parieto-occipital lobes no edema, mass effect, or midline shift. The basilar cisterns are patent. Ventricles: Prominence of the ventricles suggesting underlying parenchymal volume loss. Vascular: Atherosclerotic calcifications of the carotid siphons. No hyperdense vessel. Skull: No acute or aggressive finding. Orbits: Orbits are symmetric. Sinuses: Mucosal thickening in the bilateral maxillary and ethmoid sinuses as well as the left sphenoid sinus.  Other: Mastoid air cells are clear. IMPRESSION: 1. No acute intracranial abnormality. 2. Chronic microvascular ischemic changes and parenchymal volume loss. 3. Remote lacunar infarct in the left basal ganglia. 4. Similar appearance of encephalomalacia in the right parieto-occipital lobes. 5. Paranasal sinus disease is increased from prior. Electronically Signed   By: Emily Filbert M.D.   On: 03/25/2023 14:50    Procedures Procedures    Medications Ordered in ED Medications  hydrALAZINE (APRESOLINE) tablet 25 mg (25 mg Oral Given 03/25/23 1326)  fosfomycin (MONUROL) packet 3 g (3 g Oral Given 03/25/23 1645)    ED Course/ Medical Decision Making/ A&P                                 Medical Decision Making Amount and/or Complexity of Data Reviewed Labs: ordered. Radiology: ordered.  Risk Prescription drug management.   This patient is a 89 y.o. female  who presents to the ED for concern of altered mental status x 2 days.   Differential diagnoses prior to evaluation: The emergent differential diagnosis includes, but is not limited to,  Drug-related, hypoxia, hyper/hypoglycemia, encephalopathy, sepsis, DKA/HHS, brain lesion, CVA, seizure, environmental, psychiatric . This is not an exhaustive differential.   Past Medical History / Co-morbidities / Social History: HLD, HTN, CVA, T2DM, CAD, cognitive impairment, diabetic retinopathy, CKD, focal motor seizure disorder, takotsubo cardiomyopathy, multiple brain bleeds  Additional history: Chart reviewed. Pertinent results include: Reviewed most recent ED visit from 2/21 for cough, and most recent hospitalization records from January  Physical Exam: Physical exam performed. The pertinent findings include: Hypertensive, otherwise normal vital signs.  Heart regular rate and rhythm, lung sounds clear.  Abdomen soft and nontender.  No open wounds/sores.  Skin is pale.  Lab Tests/Imaging studies: I personally interpreted labs/imaging and the  pertinent results include: Cytosis, hemoglobin stable at 8.8.  CMP grossly unremarkable, appears to be at baseline.  UA with small leukocytes, 21-50 WBCs, few bacteria, WBC clumps, and budding yeast.  Respiratory panel negative.  CT head and chest x-ray with chronic findings, no acute findings. I agree with the radiologist interpretation.  Cardiac monitoring: EKG obtained and interpreted by myself and attending physician which shows: Sinus rhythm   Medications: I ordered medication including home blood pressure medication per daughter's request, one-time dose of fosfomycin.  I have reviewed the patients home medicines and have made adjustments  as needed.   Disposition: After consideration of the diagnostic results and the patients response to treatment, I feel that emergency department workup does not suggest an emergent condition requiring admission or immediate intervention beyond what has been performed at this time. The plan is: Discharge to home.  Overall patient's clinically well-appearing, not septic.  Urinalysis appears infected, but does have budding yeast.  I offered dose of IV Rocephin, but patient and her daughter declined this as she said that patient has not tolerated this well in the past.  Will give one-time dose of fosfomycin, but I did send a 2-week course of fluconazole to the pharmacy for possible yeast cystitis, which patient has history of.  Daughter feels that patient can go home on his treatment, and is agreeable to the plan. The patient is safe for discharge and has been instructed to return immediately for worsening symptoms, change in symptoms or any other concerns. \ Final Clinical Impression(s) / ED Diagnoses Final diagnoses:  Altered mental status, unspecified altered mental status type  Acute cystitis without hematuria  Yeast cystitis    Rx / DC Orders ED Discharge Orders          Ordered    fluconazole (DIFLUCAN) 200 MG tablet  Daily        03/25/23 1638            Portions of this report may have been transcribed using voice recognition software. Every effort was made to ensure accuracy; however, inadvertent computerized transcription errors may be present.    Su Monks, PA-C 03/25/23 1709    Pricilla Loveless, MD 03/30/23 [phone]

## 2023-03-25 NOTE — Discharge Instructions (Signed)
 Sandra [name] was seen in the ER for altered mental status.  Her urine sample showed infection. We gave her a dose of antibiotics, but I sent more medicine to treat the yeast in her urine. It is possible to have urinary tract infections from yeast which is what I think she has. We gave the one time dose of antibiotics just in case it was from a bacteria instead.   Continue to monitor how she's doing and return to the ER for new or worsening symptoms.

## 2023-03-25 NOTE — ED Notes (Signed)
 ED Provider at bedside.

## 2023-03-25 NOTE — ED Notes (Signed)
 Patient transported to CT via stretcher, tolerated well.

## 2023-03-25 NOTE — ED Triage Notes (Signed)
 Patient arrives with complaints of worsening altered mental status x2 days. Patient is accompanied by her daughter who has noticed that the patient has been slurring her words and having increased confusion.  Patient ambulating with a walker on arrival.

## 2023-04-01 ENCOUNTER — Other Ambulatory Visit: Payer: Self-pay

## 2023-04-01 ENCOUNTER — Other Ambulatory Visit: Payer: Self-pay | Admitting: Adult Health

## 2023-04-01 ENCOUNTER — Inpatient Hospital Stay (HOSPITAL_BASED_OUTPATIENT_CLINIC_OR_DEPARTMENT_OTHER)
Admission: EM | Admit: 2023-04-01 | Discharge: 2023-04-04 | DRG: 689 | Disposition: A | Discharge status: Home Health | Attending: Internal Medicine | Admitting: Internal Medicine

## 2023-04-01 ENCOUNTER — Encounter (HOSPITAL_BASED_OUTPATIENT_CLINIC_OR_DEPARTMENT_OTHER): Payer: Self-pay | Admitting: *Deleted

## 2023-04-01 ENCOUNTER — Emergency Department (HOSPITAL_BASED_OUTPATIENT_CLINIC_OR_DEPARTMENT_OTHER)

## 2023-04-01 DIAGNOSIS — Z96642 Presence of left artificial hip joint: Secondary | ICD-10-CM | POA: Diagnosis present

## 2023-04-01 DIAGNOSIS — M1990 Unspecified osteoarthritis, unspecified site: Secondary | ICD-10-CM | POA: Diagnosis not present

## 2023-04-01 DIAGNOSIS — K219 Gastro-esophageal reflux disease without esophagitis: Secondary | ICD-10-CM | POA: Diagnosis present

## 2023-04-01 DIAGNOSIS — F411 Generalized anxiety disorder: Secondary | ICD-10-CM | POA: Diagnosis present

## 2023-04-01 DIAGNOSIS — I129 Hypertensive chronic kidney disease with stage 1 through stage 4 chronic kidney disease, or unspecified chronic kidney disease: Secondary | ICD-10-CM | POA: Diagnosis not present

## 2023-04-01 DIAGNOSIS — R0600 Dyspnea, unspecified: Secondary | ICD-10-CM | POA: Diagnosis not present

## 2023-04-01 DIAGNOSIS — E785 Hyperlipidemia, unspecified: Secondary | ICD-10-CM | POA: Diagnosis present

## 2023-04-01 DIAGNOSIS — E1169 Type 2 diabetes mellitus with other specified complication: Secondary | ICD-10-CM | POA: Diagnosis present

## 2023-04-01 DIAGNOSIS — D631 Anemia in chronic kidney disease: Secondary | ICD-10-CM | POA: Diagnosis present

## 2023-04-01 DIAGNOSIS — R296 Repeated falls: Secondary | ICD-10-CM | POA: Diagnosis present

## 2023-04-01 DIAGNOSIS — I5032 Chronic diastolic (congestive) heart failure: Secondary | ICD-10-CM | POA: Diagnosis present

## 2023-04-01 DIAGNOSIS — G928 Other toxic encephalopathy: Secondary | ICD-10-CM | POA: Diagnosis present

## 2023-04-01 DIAGNOSIS — Z8673 Personal history of transient ischemic attack (TIA), and cerebral infarction without residual deficits: Secondary | ICD-10-CM

## 2023-04-01 DIAGNOSIS — I130 Hypertensive heart and chronic kidney disease with heart failure and stage 1 through stage 4 chronic kidney disease, or unspecified chronic kidney disease: Secondary | ICD-10-CM | POA: Diagnosis present

## 2023-04-01 DIAGNOSIS — E11319 Type 2 diabetes mellitus with unspecified diabetic retinopathy without macular edema: Secondary | ICD-10-CM | POA: Diagnosis present

## 2023-04-01 DIAGNOSIS — Z803 Family history of malignant neoplasm of breast: Secondary | ICD-10-CM

## 2023-04-01 DIAGNOSIS — N390 Urinary tract infection, site not specified: Secondary | ICD-10-CM | POA: Diagnosis present

## 2023-04-01 DIAGNOSIS — E113599 Type 2 diabetes mellitus with proliferative diabetic retinopathy without macular edema, unspecified eye: Secondary | ICD-10-CM | POA: Diagnosis not present

## 2023-04-01 DIAGNOSIS — R4182 Altered mental status, unspecified: Secondary | ICD-10-CM | POA: Diagnosis not present

## 2023-04-01 DIAGNOSIS — I2510 Atherosclerotic heart disease of native coronary artery without angina pectoris: Secondary | ICD-10-CM | POA: Diagnosis not present

## 2023-04-01 DIAGNOSIS — Z981 Arthrodesis status: Secondary | ICD-10-CM | POA: Diagnosis not present

## 2023-04-01 DIAGNOSIS — F05 Delirium due to known physiological condition: Secondary | ICD-10-CM | POA: Diagnosis present

## 2023-04-01 DIAGNOSIS — Z881 Allergy status to other antibiotic agents status: Secondary | ICD-10-CM

## 2023-04-01 DIAGNOSIS — J209 Acute bronchitis, unspecified: Secondary | ICD-10-CM

## 2023-04-01 DIAGNOSIS — E1122 Type 2 diabetes mellitus with diabetic chronic kidney disease: Secondary | ICD-10-CM | POA: Diagnosis present

## 2023-04-01 DIAGNOSIS — Z794 Long term (current) use of insulin: Secondary | ICD-10-CM | POA: Diagnosis not present

## 2023-04-01 DIAGNOSIS — I420 Dilated cardiomyopathy: Secondary | ICD-10-CM | POA: Diagnosis not present

## 2023-04-01 DIAGNOSIS — Z79899 Other long term (current) drug therapy: Secondary | ICD-10-CM

## 2023-04-01 DIAGNOSIS — Y92009 Unspecified place in unspecified non-institutional (private) residence as the place of occurrence of the external cause: Secondary | ICD-10-CM | POA: Diagnosis not present

## 2023-04-01 DIAGNOSIS — Z8249 Family history of ischemic heart disease and other diseases of the circulatory system: Secondary | ICD-10-CM | POA: Diagnosis not present

## 2023-04-01 DIAGNOSIS — W19XXXA Unspecified fall, initial encounter: Secondary | ICD-10-CM | POA: Diagnosis present

## 2023-04-01 DIAGNOSIS — I6782 Cerebral ischemia: Secondary | ICD-10-CM | POA: Diagnosis not present

## 2023-04-01 DIAGNOSIS — Z9842 Cataract extraction status, left eye: Secondary | ICD-10-CM

## 2023-04-01 DIAGNOSIS — N3000 Acute cystitis without hematuria: Principal | ICD-10-CM | POA: Diagnosis present

## 2023-04-01 DIAGNOSIS — Z7902 Long term (current) use of antithrombotics/antiplatelets: Secondary | ICD-10-CM

## 2023-04-01 DIAGNOSIS — R410 Disorientation, unspecified: Secondary | ICD-10-CM

## 2023-04-01 DIAGNOSIS — R9089 Other abnormal findings on diagnostic imaging of central nervous system: Secondary | ICD-10-CM | POA: Diagnosis not present

## 2023-04-01 DIAGNOSIS — Y92239 Unspecified place in hospital as the place of occurrence of the external cause: Secondary | ICD-10-CM | POA: Diagnosis present

## 2023-04-01 DIAGNOSIS — M810 Age-related osteoporosis without current pathological fracture: Secondary | ICD-10-CM | POA: Diagnosis present

## 2023-04-01 DIAGNOSIS — Z90710 Acquired absence of both cervix and uterus: Secondary | ICD-10-CM

## 2023-04-01 DIAGNOSIS — R4781 Slurred speech: Secondary | ICD-10-CM | POA: Diagnosis present

## 2023-04-01 DIAGNOSIS — R9389 Abnormal findings on diagnostic imaging of other specified body structures: Secondary | ICD-10-CM | POA: Diagnosis not present

## 2023-04-01 DIAGNOSIS — Z66 Do not resuscitate: Secondary | ICD-10-CM | POA: Diagnosis present

## 2023-04-01 DIAGNOSIS — F0390 Unspecified dementia without behavioral disturbance: Secondary | ICD-10-CM | POA: Diagnosis present

## 2023-04-01 DIAGNOSIS — N183 Chronic kidney disease, stage 3 unspecified: Secondary | ICD-10-CM | POA: Diagnosis not present

## 2023-04-01 DIAGNOSIS — J849 Interstitial pulmonary disease, unspecified: Secondary | ICD-10-CM | POA: Diagnosis not present

## 2023-04-01 DIAGNOSIS — Z888 Allergy status to other drugs, medicaments and biological substances status: Secondary | ICD-10-CM

## 2023-04-01 DIAGNOSIS — M25552 Pain in left hip: Secondary | ICD-10-CM | POA: Diagnosis not present

## 2023-04-01 DIAGNOSIS — M80052D Age-related osteoporosis with current pathological fracture, left femur, subsequent encounter for fracture with routine healing: Secondary | ICD-10-CM | POA: Diagnosis not present

## 2023-04-01 DIAGNOSIS — Z9841 Cataract extraction status, right eye: Secondary | ICD-10-CM

## 2023-04-01 DIAGNOSIS — Z823 Family history of stroke: Secondary | ICD-10-CM

## 2023-04-01 LAB — COMPREHENSIVE METABOLIC PANEL
ALT: 14 U/L (ref 0–44)
AST: 17 U/L (ref 15–41)
Albumin: 4.1 g/dL (ref 3.5–5.0)
Alkaline Phosphatase: 128 U/L — ABNORMAL HIGH (ref 38–126)
Anion gap: 10 (ref 5–15)
BUN: 41 mg/dL — ABNORMAL HIGH (ref 8–23)
CO2: 23 mmol/L (ref 22–32)
Calcium: 8.8 mg/dL — ABNORMAL LOW (ref 8.9–10.3)
Chloride: 105 mmol/L (ref 98–111)
Creatinine, Ser: 2.44 mg/dL — ABNORMAL HIGH (ref 0.44–1.00)
GFR, Estimated: 19 mL/min — ABNORMAL LOW (ref >60)
Glucose, Bld: 268 mg/dL — ABNORMAL HIGH (ref 70–99)
Potassium: 5 mmol/L (ref 3.5–5.1)
Sodium: 138 mmol/L (ref 135–145)
Total Bilirubin: 0.3 mg/dL (ref 0.0–1.2)
Total Protein: 7.2 g/dL (ref 6.5–8.1)

## 2023-04-01 LAB — CBC
HCT: 28.2 % — ABNORMAL LOW (ref 36.0–46.0)
Hemoglobin: 8.9 g/dL — ABNORMAL LOW (ref 12.0–15.0)
MCH: 30.5 pg (ref 26.0–34.0)
MCHC: 31.6 g/dL (ref 30.0–36.0)
MCV: 96.6 fL (ref 80.0–100.0)
Platelets: 299 10*3/uL (ref 150–400)
RBC: 2.92 MIL/uL — ABNORMAL LOW (ref 3.87–5.11)
RDW: 13.8 % (ref 11.5–15.5)
WBC: 6.9 10*3/uL (ref 4.0–10.5)
nRBC: 0 % (ref 0.0–0.2)

## 2023-04-01 LAB — CBG MONITORING, ED: Glucose-Capillary: 235 mg/dL — ABNORMAL HIGH (ref 70–99)

## 2023-04-01 MED ORDER — SODIUM CHLORIDE 0.9 % IV BOLUS
500.0000 mL | Freq: Once | INTRAVENOUS | Status: AC
  Administered 2023-04-02: 500 mL via INTRAVENOUS

## 2023-04-01 NOTE — ED Triage Notes (Addendum)
 Pt is brought in by her daughter due to UTI.  Daughter states that this was dx at [facility] last Tuesday.  Daughter states that her mother was more confused this pm and daughter called ems who advised her to bring pt in for further evaluation of possibly worsening UTI.  Pt is tearful in triage and reports lower back pain, no focal weakness. Daughter states that her mother has not been well since she was dx with UTI and she has had generalized weakness this week, she fell 2-3 nights ago and has a bruise to her right elbow, she has been sleeping most of the day for the past two days.

## 2023-04-02 ENCOUNTER — Emergency Department (HOSPITAL_BASED_OUTPATIENT_CLINIC_OR_DEPARTMENT_OTHER): Admitting: Radiology

## 2023-04-02 ENCOUNTER — Emergency Department (HOSPITAL_BASED_OUTPATIENT_CLINIC_OR_DEPARTMENT_OTHER)

## 2023-04-02 DIAGNOSIS — K219 Gastro-esophageal reflux disease without esophagitis: Secondary | ICD-10-CM | POA: Diagnosis present

## 2023-04-02 DIAGNOSIS — Z8673 Personal history of transient ischemic attack (TIA), and cerebral infarction without residual deficits: Secondary | ICD-10-CM | POA: Diagnosis not present

## 2023-04-02 DIAGNOSIS — N390 Urinary tract infection, site not specified: Secondary | ICD-10-CM

## 2023-04-02 DIAGNOSIS — Z90710 Acquired absence of both cervix and uterus: Secondary | ICD-10-CM | POA: Diagnosis not present

## 2023-04-02 DIAGNOSIS — Y92009 Unspecified place in unspecified non-institutional (private) residence as the place of occurrence of the external cause: Secondary | ICD-10-CM | POA: Diagnosis not present

## 2023-04-02 DIAGNOSIS — N3000 Acute cystitis without hematuria: Secondary | ICD-10-CM | POA: Diagnosis present

## 2023-04-02 DIAGNOSIS — E11319 Type 2 diabetes mellitus with unspecified diabetic retinopathy without macular edema: Secondary | ICD-10-CM | POA: Diagnosis present

## 2023-04-02 DIAGNOSIS — Z9842 Cataract extraction status, left eye: Secondary | ICD-10-CM | POA: Diagnosis not present

## 2023-04-02 DIAGNOSIS — G928 Other toxic encephalopathy: Secondary | ICD-10-CM | POA: Diagnosis present

## 2023-04-02 DIAGNOSIS — Z96642 Presence of left artificial hip joint: Secondary | ICD-10-CM | POA: Diagnosis present

## 2023-04-02 DIAGNOSIS — Y92239 Unspecified place in hospital as the place of occurrence of the external cause: Secondary | ICD-10-CM | POA: Diagnosis present

## 2023-04-02 DIAGNOSIS — M25552 Pain in left hip: Secondary | ICD-10-CM | POA: Diagnosis not present

## 2023-04-02 DIAGNOSIS — Z981 Arthrodesis status: Secondary | ICD-10-CM | POA: Diagnosis not present

## 2023-04-02 DIAGNOSIS — Z66 Do not resuscitate: Secondary | ICD-10-CM | POA: Diagnosis present

## 2023-04-02 DIAGNOSIS — R410 Disorientation, unspecified: Secondary | ICD-10-CM | POA: Diagnosis not present

## 2023-04-02 DIAGNOSIS — Z9841 Cataract extraction status, right eye: Secondary | ICD-10-CM | POA: Diagnosis not present

## 2023-04-02 DIAGNOSIS — R296 Repeated falls: Secondary | ICD-10-CM | POA: Diagnosis present

## 2023-04-02 DIAGNOSIS — I130 Hypertensive heart and chronic kidney disease with heart failure and stage 1 through stage 4 chronic kidney disease, or unspecified chronic kidney disease: Secondary | ICD-10-CM | POA: Diagnosis present

## 2023-04-02 DIAGNOSIS — E785 Hyperlipidemia, unspecified: Secondary | ICD-10-CM | POA: Diagnosis present

## 2023-04-02 DIAGNOSIS — Z8249 Family history of ischemic heart disease and other diseases of the circulatory system: Secondary | ICD-10-CM | POA: Diagnosis not present

## 2023-04-02 DIAGNOSIS — N183 Chronic kidney disease, stage 3 unspecified: Secondary | ICD-10-CM | POA: Diagnosis present

## 2023-04-02 DIAGNOSIS — F0390 Unspecified dementia without behavioral disturbance: Secondary | ICD-10-CM | POA: Diagnosis present

## 2023-04-02 DIAGNOSIS — F411 Generalized anxiety disorder: Secondary | ICD-10-CM | POA: Diagnosis present

## 2023-04-02 DIAGNOSIS — W19XXXA Unspecified fall, initial encounter: Secondary | ICD-10-CM | POA: Diagnosis present

## 2023-04-02 DIAGNOSIS — R9089 Other abnormal findings on diagnostic imaging of central nervous system: Secondary | ICD-10-CM | POA: Diagnosis not present

## 2023-04-02 DIAGNOSIS — E1169 Type 2 diabetes mellitus with other specified complication: Secondary | ICD-10-CM | POA: Diagnosis present

## 2023-04-02 DIAGNOSIS — I5032 Chronic diastolic (congestive) heart failure: Secondary | ICD-10-CM | POA: Diagnosis present

## 2023-04-02 DIAGNOSIS — D631 Anemia in chronic kidney disease: Secondary | ICD-10-CM | POA: Diagnosis present

## 2023-04-02 DIAGNOSIS — Z794 Long term (current) use of insulin: Secondary | ICD-10-CM | POA: Diagnosis not present

## 2023-04-02 DIAGNOSIS — F05 Delirium due to known physiological condition: Secondary | ICD-10-CM | POA: Diagnosis present

## 2023-04-02 DIAGNOSIS — E1122 Type 2 diabetes mellitus with diabetic chronic kidney disease: Secondary | ICD-10-CM | POA: Diagnosis present

## 2023-04-02 LAB — GLUCOSE, CAPILLARY
Glucose-Capillary: 231 mg/dL — ABNORMAL HIGH (ref 70–99)
Glucose-Capillary: 140 mg/dL — ABNORMAL HIGH (ref 70–99)
Glucose-Capillary: 119 mg/dL — ABNORMAL HIGH (ref 70–99)

## 2023-04-02 LAB — RESP PANEL BY RT-PCR (RSV, FLU A&B, COVID)  RVPGX2
Influenza A by PCR: NEGATIVE
Influenza B by PCR: NEGATIVE
Resp Syncytial Virus by PCR: NEGATIVE
SARS Coronavirus 2 by RT PCR: NEGATIVE

## 2023-04-02 LAB — URINALYSIS, ROUTINE W REFLEX MICROSCOPIC
Bacteria, UA: NONE SEEN
Bilirubin Urine: NEGATIVE
Glucose, UA: 500 mg/dL — AB
Ketones, ur: NEGATIVE mg/dL
Nitrite: NEGATIVE
Protein, ur: 100 mg/dL — AB
Specific Gravity, Urine: 1.014 (ref 1.005–1.030)
WBC, UA: >50 WBC/hpf (ref 0–5)
pH: 6 (ref 5.0–8.0)

## 2023-04-02 MED ORDER — ATORVASTATIN CALCIUM 40 MG PO TABS
80.0000 mg | ORAL_TABLET | Freq: Every evening | ORAL | Status: DC
  Administered 2023-04-02 – 2023-04-03 (×2): 80 mg via ORAL
  Filled 2023-04-02 (×2): qty 2

## 2023-04-02 MED ORDER — CITALOPRAM HYDROBROMIDE 20 MG PO TABS
10.0000 mg | ORAL_TABLET | Freq: Every day | ORAL | Status: DC
  Administered 2023-04-02 – 2023-04-04 (×3): 10 mg via ORAL
  Filled 2023-04-02 (×3): qty 1

## 2023-04-02 MED ORDER — ONDANSETRON HCL 4 MG/2ML IJ SOLN: 4.0000 mg | Freq: Four times a day (QID) | INTRAMUSCULAR | Status: DC | PRN

## 2023-04-02 MED ORDER — INSULIN ASPART 100 UNIT/ML IJ SOLN: 0.0000 [IU] | Freq: Every day | INTRAMUSCULAR | Status: DC

## 2023-04-02 MED ORDER — ALBUTEROL SULFATE (2.5 MG/3ML) 0.083% IN NEBU: 2.5000 mg | INHALATION_SOLUTION | RESPIRATORY_TRACT | Status: DC | PRN

## 2023-04-02 MED ORDER — HYDROXYZINE HCL 10 MG PO TABS
10.0000 mg | ORAL_TABLET | Freq: Three times a day (TID) | ORAL | Status: DC | PRN
  Administered 2023-04-02: 10 mg via ORAL
  Filled 2023-04-02 (×2): qty 1

## 2023-04-02 MED ORDER — CLOPIDOGREL BISULFATE 75 MG PO TABS
75.0000 mg | ORAL_TABLET | Freq: Every day | ORAL | Status: DC
  Administered 2023-04-02 – 2023-04-04 (×3): 75 mg via ORAL
  Filled 2023-04-02 (×3): qty 1

## 2023-04-02 MED ORDER — SODIUM CHLORIDE 0.9 % IV SOLN
1.0000 g | Freq: Once | INTRAVENOUS | Status: AC
  Administered 2023-04-02: 1 g via INTRAVENOUS
  Filled 2023-04-02: qty 10

## 2023-04-02 MED ORDER — HALOPERIDOL LACTATE 5 MG/ML IJ SOLN
1.0000 mg | Freq: Once | INTRAMUSCULAR | Status: AC
  Administered 2023-04-02: 1 mg via INTRAVENOUS
  Filled 2023-04-02: qty 1

## 2023-04-02 MED ORDER — ACETAMINOPHEN 650 MG RE SUPP: 650.0000 mg | Freq: Four times a day (QID) | RECTAL | Status: DC | PRN

## 2023-04-02 MED ORDER — CEFTRIAXONE SODIUM 1 G IJ SOLR
1.0000 g | INTRAMUSCULAR | Status: DC
  Administered 2023-04-02: 1 g via INTRAVENOUS
  Filled 2023-04-02: qty 10

## 2023-04-02 MED ORDER — METOPROLOL SUCCINATE ER 25 MG PO TB24
25.0000 mg | ORAL_TABLET | Freq: Every day | ORAL | Status: DC
  Administered 2023-04-02 – 2023-04-04 (×3): 25 mg via ORAL
  Filled 2023-04-02 (×3): qty 1

## 2023-04-02 MED ORDER — FAMOTIDINE 20 MG PO TABS
40.0000 mg | ORAL_TABLET | Freq: Every day | ORAL | Status: DC
  Administered 2023-04-02 – 2023-04-03 (×2): 40 mg via ORAL
  Filled 2023-04-02 (×2): qty 2

## 2023-04-02 MED ORDER — FENTANYL CITRATE PF 50 MCG/ML IJ SOSY
50.0000 ug | PREFILLED_SYRINGE | Freq: Once | INTRAMUSCULAR | Status: AC
  Administered 2023-04-02: 50 ug via INTRAVENOUS
  Filled 2023-04-02: qty 1

## 2023-04-02 MED ORDER — ONDANSETRON HCL 4 MG PO TABS: 4.0000 mg | ORAL_TABLET | Freq: Four times a day (QID) | ORAL | Status: DC | PRN

## 2023-04-02 MED ORDER — FLUCONAZOLE 100 MG PO TABS
200.0000 mg | ORAL_TABLET | Freq: Every day | ORAL | Status: DC
  Administered 2023-04-02 – 2023-04-04 (×3): 200 mg via ORAL
  Filled 2023-04-02 (×3): qty 2

## 2023-04-02 MED ORDER — INSULIN ASPART 100 UNIT/ML IJ SOLN
0.0000 [IU] | Freq: Three times a day (TID) | INTRAMUSCULAR | Status: DC
  Administered 2023-04-02: 5 [IU] via SUBCUTANEOUS
  Administered 2023-04-02 – 2023-04-03 (×2): 2 [IU] via SUBCUTANEOUS
  Administered 2023-04-03: 3 [IU] via SUBCUTANEOUS
  Administered 2023-04-03: 2 [IU] via SUBCUTANEOUS
  Administered 2023-04-04: 3 [IU] via SUBCUTANEOUS

## 2023-04-02 MED ORDER — HEPARIN SODIUM (PORCINE) 5000 UNIT/ML IJ SOLN
5000.0000 [IU] | Freq: Three times a day (TID) | INTRAMUSCULAR | Status: DC
  Administered 2023-04-02 – 2023-04-04 (×6): 5000 [IU] via SUBCUTANEOUS
  Filled 2023-04-02 (×6): qty 1

## 2023-04-02 MED ORDER — SODIUM CHLORIDE 0.9 % IV SOLN: Freq: Once | INTRAVENOUS | Status: AC

## 2023-04-02 MED ORDER — ACETAMINOPHEN 325 MG PO TABS: 650.0000 mg | ORAL_TABLET | Freq: Four times a day (QID) | ORAL | Status: DC | PRN

## 2023-04-02 NOTE — Inpatient Diabetes Management (Signed)
 Inpatient Diabetes Program Recommendations  AACE/ADA: New Consensus Statement on Inpatient Glycemic Control (2015)  Target Ranges:  Prepandial:   less than 140 mg/dL      Peak postprandial:   less than 180 mg/dL (1-2 hours)      Critically ill patients:  140 - 180 mg/dL   Lab Results  Component Value Date   GLUCAP 231 (H) 04/02/2023   HGBA1C 8.2 (H) 11/30/2022    Review of Glycemic Control  Latest Reference Range & Units 04/01/23 21:24 04/02/23 11:52  Glucose-Capillary 70 - 99 mg/dL 161 (H) 096 (H)  (H): Data is abnormally high  Latest Reference Range & Units 04/01/23 21:20  GFR, Estimated >60 mL/min 19 (L)  (L): Data is abnormally low  Diabetes history: DM2 Outpatient Diabetes medications: Novolog 6-8 units TID Current orders for Inpatient glycemic control: Novolog 0-15 units TID and 0-5 units QHS  Inpatient Diabetes Program Recommendations:    Please consider:  Novolog 0-6 units TID and 0-5 units at bedtime.   Will continue to follow while inpatient.  Thank you, Dulce Sellar, MSN, CDCES Diabetes Coordinator Inpatient Diabetes Program [phone] (team pager from 8a-5p)

## 2023-04-02 NOTE — H&P (Signed)
 History and Physical  Sandra [name] WUJ:[id number] DOB: 05-Oct-1933 DOA: 04/01/2023  PCP: [name], NP   Chief Complaint: Confusion  HPI: Sandra [name] is a 89 y.o. female with medical history significant for lipidemia, hypertension, CVA, diabetic retinopathy, CKD stage III, cognitive impairment, Takotsubo cardiomyopathy and multiple brain bleeds who is being admitted to the hospital with toxic metabolic encephalopathy due to UTI.  She presented to the emergency department at [facility] 3/6 complaints of altered mental status was found to have evidence of UTI.  Was given a dose of fosfomycin and discharged home.  Per daughter, patient has had generalized weakness all week, has fallen at home a couple of times and been more fused than usual.  Workup in the emergency department early this morning shows evidence of continued UTI.  She was given a dose of IV Rocephin, and accepted for observation admission to the hospitalist service.  Review of Systems: Please see HPI for pertinent positives and negatives. A complete 10 system review of systems could not be performed due to the patient's advanced dementia.  Past Medical History:  Diagnosis Date   Achilles tendon injury 11/27/2010   ALLERGIC RHINITIS 05/12/2006   Qualifier: Diagnosis of  By: Drue Novel MD, Nolon Rod.    Anxiety state 07/03/2009   Qualifier: Diagnosis of  By: Drue Novel MD, Jose E.    Back pain 07/01/2010   Chronic kidney disease (CKD), stage III (moderate) (HCC) 05/31/2014   Chronic right SI joint pain 09/22/2013   Colitis 2024   DEGENERATIVE JOINT DISEASE, CERVICAL SPINE 06/23/2006   Annotation: had a CAT scan with a cervical myelogram that show  left C6-7 and  C5-6 foraminal stenosis Qualifier: Diagnosis of  By: Drue Novel MD, Nolon Rod.    DEPRESSION 05/12/2006   Qualifier: Diagnosis of  By: Drue Novel MD, Jose E.    DIABETES MELLITUS, TYPE II 05/12/2006   Now following w/ Endo at [facility]     DIABETIC  RETINOPATHY 04/27/2007   Qualifier:  Diagnosis of  By: Janit Bern     Encephalopathy, hypertensive    Essential hypertension 05/12/2006   Qualifier: Diagnosis of  By: Drue Novel MD, Nolon Rod.    GAIT DISTURBANCE 08/07/2009   Qualifier: Diagnosis of  By: Drue Novel MD, Nolon Rod.    GERD (gastroesophageal reflux disease)    History of colonoscopy    History of CT scan    History of mammogram    History of MRI    Hyperlipidemia associated with type 2 diabetes mellitus (HCC) 05/12/2006   Qualifier: Diagnosis of  By: Drue Novel MD, Jose E.    ILD (interstitial lung disease) (HCC) 05/31/2015   Lobar cerebral hemorrhage (HCC) 06/22/2019   NECK PAIN, CHRONIC 04/03/2008   Qualifier: Diagnosis of  By: Drue Novel MD, Nolon Rod.    Osteoarthritis 02/10/2016   Osteopenia    Osteoporosis 06/23/2006   Annotation: had a bone density test in 08-2004.  T score was -2.4 Qualifier: Diagnosis of  By: Drue Novel MD, Nolon Rod     [facility] (subarachnoid hemorrhage) (HCC)    Scleroderma (HCC) 02/28/2016   Stroke [facility])    Takotsubo cardiomyopathy    s/p stress MI with stress induced CM with normal coronary arteries by cath 2007 with normalization of LVF by echo 04/2005   TIA (transient ischemic attack)    Vasculitis of skin 09/28/2012   Vitamin D deficiency 10/01/2014   Past Surgical History:  Procedure Laterality Date   ABDOMINAL HYSTERECTOMY  01/20/1975   no oophorectomy  ANTERIOR APPROACH HEMI HIP ARTHROPLASTY Left 02/11/2023   Procedure: ANTERIOR APPROACH HEMI HIP ARTHROPLASTY;  Surgeon: Kathryne Hitch, MD;  Location: [facility] OR;  Service: Orthopedics;  Laterality: Left;   APPENDECTOMY     CARDIAC CATHETERIZATION     CATARACT EXTRACTION, BILATERAL  01/19/2009   FRACTURE SURGERY     SPINAL FUSION  01/19/1998   Dr. Venetia Maxon   TONSILLECTOMY     Social History:  reports that she has never smoked. She has been exposed to tobacco smoke. She has never used smokeless tobacco. She reports current alcohol use of about 1.0 standard drink of alcohol per week. She reports that she  does not use drugs.  Allergies  Allergen Reactions   Cephalexin Nausea And Vomiting    Pt stated made severely sick, will never take again   Pioglitazone Swelling    REACTION: EDEMA   Nintedanib Diarrhea   Sertraline Nausea And Vomiting    Family History  Problem Relation Age of Onset   Stroke Mother    Cancer Sister    Lung disease Sister    Intracerebral hemorrhage Daughter        assoc with post-partum   Hypertension Daughter    Breast cancer Other        ?Aunt   Cancer Other        breast?   Coronary artery disease Neg Hx      Prior to Admission medications   Medication Sig Start Date End Date Taking? Authorizing Provider  acetaminophen (TYLENOL) 500 MG tablet Take 500 mg by mouth every 6 (six) hours as needed for mild pain (pain score 1-3) or headache.    [provider]  albuterol (VENTOLIN HFA) 108 (90 Base) MCG/ACT inhaler TAKE 2 PUFFS BY MOUTH EVERY 6 HOURS AS NEEDED FOR WHEEZE OR SHORTNESS OF BREATH 04/01/23   Medina-Vargas, Monina C, NP  atorvastatin (LIPITOR) 80 MG tablet TAKE 1 TABLET EVERY DAY Patient taking differently: Take 80 mg by mouth every evening. 11/26/22   Ngetich, Dinah C, NP  citalopram (CELEXA) 10 MG tablet Take 1 tablet (10 mg total) by mouth daily. 03/03/23   Ngetich, Dinah C, NP  clonazePAM (KLONOPIN) 0.5 MG tablet Take 1 tablet (0.5 mg total) by mouth 2 (two) times daily as needed for anxiety. 03/03/23   Ngetich, Dinah C, NP  clopidogrel (PLAVIX) 75 MG tablet TAKE 1 TABLET EVERY DAY 02/10/23   Ngetich, Dinah C, NP  famotidine (PEPCID) 40 MG tablet TAKE 1 TABLET EVERY DAY 02/05/23   Ngetich, Dinah C, NP  fluconazole (DIFLUCAN) 200 MG tablet Take 1 tablet (200 mg total) by mouth daily for 14 days. 03/25/23 04/08/23  Roemhildt, Lorin T, PA-C  hydrALAZINE (APRESOLINE) 25 MG tablet Take 1 tablet (25 mg total) by mouth 3 (three) times daily. 12/29/22 03/29/23  Carollee Herter, DO  hydrOXYzine (ATARAX) 10 MG tablet Take 1 tablet (10 mg total) by mouth 3  (three) times daily as needed. 03/23/23   Ngetich, Dinah C, NP  loperamide (IMODIUM) 2 MG capsule Take 1 capsule (2 mg total) by mouth as needed for diarrhea or loose stools. Patient not taking: Reported on 03/05/2023 02/18/23   Rai, Delene Ruffini, MD  metoprolol succinate (TOPROL-XL) 25 MG 24 hr tablet Take 1 tablet (25 mg total) by mouth daily. Take with or immediately following a meal. 02/18/23   Rai, Ripudeep K, MD  NOVOLOG FLEXPEN 100 UNIT/ML FlexPen Inject 6-8 Units into the skin 3 (three) times daily with meals as needed for  high blood sugar. Pt completes sliding scale. 02/18/21   [provider]    Physical Exam: BP (!) 190/76 (BP Location: Right Arm)   Pulse 95   Temp 97.7 F (36.5 C) (Oral)   Resp 17   SpO2 100%  General:  Alert, oriented to self only, calm, in no acute distress, RN at the bedside Cardiovascular: RRR, no murmurs or rubs, no peripheral edema  Respiratory: clear to auscultation bilaterally, no wheezes, no crackles  Abdomen: soft, nontender, nondistended, normal bowel tones heard  Skin: dry, no rashes  Musculoskeletal: no joint effusions, normal range of motion  Psychiatric: appropriate affect, normal speech  Neurologic: extraocular muscles intact, clear speech, moving all extremities with intact sensorium         Labs on Admission:  Basic Metabolic Panel: Recent Labs  Lab 04/01/23 2120  NA 138  K 5.0  CL 105  CO2 23  GLUCOSE 268*  BUN 41*  CREATININE 2.44*  CALCIUM 8.8*   Liver Function Tests: Recent Labs  Lab 04/01/23 2120  AST 17  ALT 14  ALKPHOS 128*  BILITOT 0.3  PROT 7.2  ALBUMIN 4.1   No results for input(s): "LIPASE", "AMYLASE" in the last 168 hours. No results for input(s): "AMMONIA" in the last 168 hours. CBC: Recent Labs  Lab 04/01/23 2120  WBC 6.9  HGB 8.9*  HCT 28.2*  MCV 96.6  PLT 299   Cardiac Enzymes: No results for input(s): "CKTOTAL", "CKMB", "CKMBINDEX", "TROPONINI" in the last 168 hours. BNP (last 3  results) Recent Labs    12/28/22 0704 12/29/22 0452 03/12/23 1515  BNP 509.4* 469.9* 348.7*    ProBNP (last 3 results) No results for input(s): "PROBNP" in the last 8760 hours.  CBG: Recent Labs  Lab 04/01/23 2124  GLUCAP 235*    Radiological Exams on Admission: CT Head Wo Contrast Result Date: 04/02/2023 CLINICAL DATA:  Dementia, UTI, and fall.  Head and neck trauma. EXAM: CT HEAD WITHOUT CONTRAST CT CERVICAL SPINE WITHOUT CONTRAST TECHNIQUE: Multidetector CT imaging of the head and cervical spine was performed following the standard protocol without intravenous contrast. Multiplanar CT image reconstructions of the cervical spine were also generated. RADIATION DOSE REDUCTION: This exam was performed according to the departmental dose-optimization program which includes automated exposure control, adjustment of the mA and/or kV according to patient size and/or use of iterative reconstruction technique. COMPARISON:  01/25/2023 FINDINGS: CT HEAD FINDINGS Brain: No evidence of acute infarction, hemorrhage, hydrocephalus, extra-axial collection or mass lesion/mass effect. Chronic small vessel ischemia which is confluent in the deep white matter. Chronic infarct centered at the right temporal occipital cortex. Generalized cerebral volume loss. Vascular: No hyperdense vessel or unexpected calcification. Skull: No acute fracture Sinuses/Orbits: No evidence of injury CT CERVICAL SPINE FINDINGS Alignment: No traumatic malalignment Skull base and vertebrae: No acute fracture. C6-7 ACDF with solid arthrodesis. Facet ankylosis at C3-4 and C4-5. Soft tissues and spinal canal: No prevertebral fluid or swelling. No visible canal hematoma. Disc levels:  Generalized degenerative endplate and facet spurring. Upper chest: No visible injury IMPRESSION: No evidence of acute intracranial or cervical spine injury. Electronically Signed   By: Tiburcio Pea M.D.   On: 04/02/2023 08:31   CT Cervical Spine Wo  Contrast Result Date: 04/02/2023 CLINICAL DATA:  Dementia, UTI, and fall.  Head and neck trauma. EXAM: CT HEAD WITHOUT CONTRAST CT CERVICAL SPINE WITHOUT CONTRAST TECHNIQUE: Multidetector CT imaging of the head and cervical spine was performed following the standard protocol without intravenous contrast.  Multiplanar CT image reconstructions of the cervical spine were also generated. RADIATION DOSE REDUCTION: This exam was performed according to the departmental dose-optimization program which includes automated exposure control, adjustment of the mA and/or kV according to patient size and/or use of iterative reconstruction technique. COMPARISON:  01/25/2023 FINDINGS: CT HEAD FINDINGS Brain: No evidence of acute infarction, hemorrhage, hydrocephalus, extra-axial collection or mass lesion/mass effect. Chronic small vessel ischemia which is confluent in the deep white matter. Chronic infarct centered at the right temporal occipital cortex. Generalized cerebral volume loss. Vascular: No hyperdense vessel or unexpected calcification. Skull: No acute fracture Sinuses/Orbits: No evidence of injury CT CERVICAL SPINE FINDINGS Alignment: No traumatic malalignment Skull base and vertebrae: No acute fracture. C6-7 ACDF with solid arthrodesis. Facet ankylosis at C3-4 and C4-5. Soft tissues and spinal canal: No prevertebral fluid or swelling. No visible canal hematoma. Disc levels:  Generalized degenerative endplate and facet spurring. Upper chest: No visible injury IMPRESSION: No evidence of acute intracranial or cervical spine injury. Electronically Signed   By: Tiburcio Pea M.D.   On: 04/02/2023 08:31   DG Chest Portable 1 View Result Date: 04/02/2023 CLINICAL DATA:  Dyspnea EXAM: PORTABLE CHEST 1 VIEW COMPARISON:  03/25/2023 FINDINGS: Lung volumes are small. Stable elevation right hemidiaphragm. No superimposed confluent pulmonary infiltrate. No pneumothorax or pleural effusion. Cardiac size within normal limits.  Pulmonary vascularity is normal. No acute bone IMPRESSION: 1. Pulmonary hypoinflation. Electronically Signed   By: Helyn Numbers M.D.   On: 04/02/2023 01:21   Assessment/Plan Sandra [name] is a 89 y.o. female with medical history significant for lipidemia, hypertension, CVA, diabetic retinopathy, CKD stage III, cognitive impairment, Takotsubo cardiomyopathy and multiple brain bleeds who is being admitted to the hospital with toxic metabolic encephalopathy due to UTI.    UTI-with increased confusion, abnormal urinalysis.  No evidence of sepsis. -Observation admission -Empiric IV Rocephin -Follow-up urine culture obtained in the ER 3/13  CKD stage III-renal function appears to be at baseline, will avoid nephrotoxins as able, renally dose medications and monitor renal function with daily labs  Chronic diastolic heart failure-currently the patient appears euvolemic without any evidence of acute exacerbation -Continue Toprol-XL  Type 2 diabetes-patient's long-acting insulin was recently held due to hypoglycemia. -Carb modified diet -Moderate dose sliding scale  Hyperlipidemia-Lipitor  GERD-Pepcid daily  History of CVA-Plavix  Multiple falls at home-patient also had a fall which was unwitnessed this morning in the outside ER.  She had hip x-ray as well as CT scan of the head without acute findings. -Close monitoring and fall precautions in the hospital -PT/OT consult  DVT prophylaxis: Heparin subcu    Code Status: Limited: Do not attempt resuscitation (DNR) -DNR-LIMITED -Do Not Intubate/DNI   Consults called: None  Admission status: Observation  Time spent: 48 minutes  Dmitri Pettigrew Sharlette Dense MD [facility] Pager [phone]  If 7PM-7AM, please contact night-coverage [email or web address] Password [facility]  04/02/2023, 9:55 AM

## 2023-04-02 NOTE — ED Notes (Signed)
 Daughter notified of pt bed and transfer. [facility] on the way. Will receive report at bedside. Pt resting at this time.

## 2023-04-02 NOTE — Plan of Care (Signed)
 Plan of Care Note for accepted transfer   Patient name: Sandra [name] WUJ:[id number] DOB: 21-Mar-1933  Facility requesting transfer: [facility] ED Requesting Provider: Dr. Nicanor Alcon Facility course: 89 year old female with history of CKD stage III, diabetes, hypertension, hyperlipidemia, ILD, SAH, scleroderma, stroke/TIA, Takotsubo cardiomyopathy, and other medical comorbidities presented to the ED for evaluation of confusion and generalized weakness in the setting of recently being diagnosed with UTI.  Chest x-ray showing no acute disease.  COVID/influenza/RSV negative.  No fever or leukocytosis, creatinine 2.4 (previously 2.0 on labs done 8 days ago).  UA with negative nitrite, moderate leukocytes, and microscopy showing >50 WBCs and no bacteria.  Urine culture pending.  Patient was given Haldol, ceftriaxone, and 500 mL normal saline bolus.  Plan of care: The patient is accepted for admission to Telemetry unit at [facility].  [facility] will assume care on arrival to accepting facility. Until arrival, care as per EDP. However, [facility] available 24/7 for questions and assistance.  Check [email or web address] for on-call coverage.  Nursing staff, please call [facility] Admits & Consults System-Wide number under Amion on patient's arrival so appropriate admitting provider can evaluate the pt.

## 2023-04-02 NOTE — ED Notes (Signed)
 Pt was found on the floor after intentional rounding and resting. Pt attempted to get up through the end of the stretcher and fell in the floor. Alert and responsive. Denies hitting her head. Pt remains confused at baseline. Placed back in the bed with assistance. No obvious skin injuries or musculoskeletal injuries. MD at bedside and examined pt. Orders received and CT notified. Charge also at bedside. Daughter was notified and floor pt being transferred also notified.

## 2023-04-02 NOTE — ED Provider Notes (Signed)
 [place] [facility] AT [facility] Provider Note   CSN: [id number] Arrival date & time: 04/01/23  2105     History  Chief Complaint  Patient presents with   Urinary Tract Infection   Altered Mental Status    Sandra [name] is a 89 y.o. female.  The history is provided by the patient, a relative and a caregiver. The history is limited by the condition of the patient.  Altered Mental Status Presenting symptoms: confusion   Severity:  Moderate Most recent episode:  Today Episode history:  Single Timing:  Constant Progression:  Unchanged Chronicity:  New Context: recent illness and recent infection   Context: not nursing home resident   Associated symptoms: no fever and no vomiting   Patient with DM and CKD presents with AMS and concern for worsening UTI.  Was seen at [facility] for UTI and started on fluconazole and symptoms have worsened and now patient is confused.      Past Medical History:  Diagnosis Date   Achilles tendon injury 11/27/2010   ALLERGIC RHINITIS 05/12/2006   Qualifier: Diagnosis of  By: Drue Novel MD, Nolon Rod.    Anxiety state 07/03/2009   Qualifier: Diagnosis of  By: Drue Novel MD, Jose E.    Back pain 07/01/2010   Chronic kidney disease (CKD), stage III (moderate) (HCC) 05/31/2014   Chronic right SI joint pain 09/22/2013   Colitis 2024   DEGENERATIVE JOINT DISEASE, CERVICAL SPINE 06/23/2006   Annotation: had a CAT scan with a cervical myelogram that show  left C6-7 and  C5-6 foraminal stenosis Qualifier: Diagnosis of  By: Drue Novel MD, Nolon Rod.    DEPRESSION 05/12/2006   Qualifier: Diagnosis of  By: Drue Novel MD, Jose E.    DIABETES MELLITUS, TYPE II 05/12/2006   Now following w/ Endo at [facility]     DIABETIC  RETINOPATHY 04/27/2007   Qualifier: Diagnosis of  By: Janit Bern     Encephalopathy, hypertensive    Essential hypertension 05/12/2006   Qualifier: Diagnosis of  By: Drue Novel MD, Nolon Rod.    GAIT DISTURBANCE 08/07/2009   Qualifier: Diagnosis of  By:  Drue Novel MD, Nolon Rod.    GERD (gastroesophageal reflux disease)    History of colonoscopy    History of CT scan    History of mammogram    History of MRI    Hyperlipidemia associated with type 2 diabetes mellitus (HCC) 05/12/2006   Qualifier: Diagnosis of  By: Drue Novel MD, Jose E.    ILD (interstitial lung disease) (HCC) 05/31/2015   Lobar cerebral hemorrhage (HCC) 06/22/2019   NECK PAIN, CHRONIC 04/03/2008   Qualifier: Diagnosis of  By: Drue Novel MD, Nolon Rod.    Osteoarthritis 02/10/2016   Osteopenia    Osteoporosis 06/23/2006   Annotation: had a bone density test in 08-2004.  T score was -2.4 Qualifier: Diagnosis of  By: Drue Novel MD, Nolon Rod     [facility] (subarachnoid hemorrhage) (HCC)    Scleroderma (HCC) 02/28/2016   Stroke [facility])    Takotsubo cardiomyopathy    s/p stress MI with stress induced CM with normal coronary arteries by cath 2007 with normalization of LVF by echo 04/2005   TIA (transient ischemic attack)    Vasculitis of skin 09/28/2012   Vitamin D deficiency 10/01/2014     Home Medications Prior to Admission medications   Medication Sig Start Date End Date Taking? Authorizing Provider  acetaminophen (TYLENOL) 500 MG tablet Take 500 mg by mouth every 6 (six) hours as needed for  mild pain (pain score 1-3) or headache.    [provider]  albuterol (VENTOLIN HFA) 108 (90 Base) MCG/ACT inhaler TAKE 2 PUFFS BY MOUTH EVERY 6 HOURS AS NEEDED FOR WHEEZE OR SHORTNESS OF BREATH 04/01/23   Medina-Vargas, Monina C, NP  atorvastatin (LIPITOR) 80 MG tablet TAKE 1 TABLET EVERY DAY Patient taking differently: Take 80 mg by mouth every evening. 11/26/22   Ngetich, Dinah C, NP  citalopram (CELEXA) 10 MG tablet Take 1 tablet (10 mg total) by mouth daily. 03/03/23   Ngetich, Dinah C, NP  clonazePAM (KLONOPIN) 0.5 MG tablet Take 1 tablet (0.5 mg total) by mouth 2 (two) times daily as needed for anxiety. 03/03/23   Ngetich, Dinah C, NP  clopidogrel (PLAVIX) 75 MG tablet TAKE 1 TABLET EVERY DAY 02/10/23   Ngetich,  Dinah C, NP  famotidine (PEPCID) 40 MG tablet TAKE 1 TABLET EVERY DAY 02/05/23   Ngetich, Dinah C, NP  fluconazole (DIFLUCAN) 200 MG tablet Take 1 tablet (200 mg total) by mouth daily for 14 days. 03/25/23 04/08/23  Roemhildt, Lorin T, PA-C  hydrALAZINE (APRESOLINE) 25 MG tablet Take 1 tablet (25 mg total) by mouth 3 (three) times daily. 12/29/22 03/29/23  Carollee Herter, DO  hydrOXYzine (ATARAX) 10 MG tablet Take 1 tablet (10 mg total) by mouth 3 (three) times daily as needed. 03/23/23   Ngetich, Dinah C, NP  loperamide (IMODIUM) 2 MG capsule Take 1 capsule (2 mg total) by mouth as needed for diarrhea or loose stools. Patient not taking: Reported on 03/05/2023 02/18/23   Rai, Delene Ruffini, MD  metoprolol succinate (TOPROL-XL) 25 MG 24 hr tablet Take 1 tablet (25 mg total) by mouth daily. Take with or immediately following a meal. 02/18/23   Rai, Ripudeep K, MD  NOVOLOG FLEXPEN 100 UNIT/ML FlexPen Inject 6-8 Units into the skin 3 (three) times daily with meals as needed for high blood sugar. Pt completes sliding scale. 02/18/21   [provider]      Allergies    Cephalexin, Pioglitazone, Nintedanib, and Sertraline    Review of Systems   Review of Systems  Constitutional:  Negative for fever.  HENT:  Negative for facial swelling.   Respiratory:  Negative for wheezing and stridor.   Gastrointestinal:  Negative for vomiting.  Genitourinary:  Positive for dysuria.  Psychiatric/Behavioral:  Positive for confusion.   All other systems reviewed and are negative.   Physical Exam Updated Vital Signs BP (!) 158/86   Pulse 89   Temp 98.5 F (36.9 C)   Resp 18   SpO2 96%  Physical Exam Vitals and nursing note reviewed.  Constitutional:      General: She is not in acute distress.    Appearance: She is well-developed.  HENT:     Head: Normocephalic and atraumatic.     Nose: Nose normal.  Eyes:     Pupils: Pupils are equal, round, and reactive to light.  Cardiovascular:     Rate and Rhythm:  Normal rate and regular rhythm.     Pulses: Normal pulses.     Heart sounds: Normal heart sounds.  Pulmonary:     Effort: No respiratory distress.     Breath sounds: Normal breath sounds.  Abdominal:     General: Bowel sounds are normal. There is no distension.     Palpations: Abdomen is soft.     Tenderness: There is no abdominal tenderness. There is no guarding or rebound.  Musculoskeletal:  General: Normal range of motion.     Cervical back: Neck supple.  Skin:    Capillary Refill: Capillary refill takes less than 2 seconds.     Findings: No erythema or rash.  Neurological:     General: No focal deficit present.     Mental Status: She is oriented to person, place, and time.     Deep Tendon Reflexes: Reflexes normal.  Psychiatric:        Mood and Affect: Mood normal.        Behavior: Behavior normal.     ED Results / Procedures / Treatments   Labs (all labs ordered are listed, but only abnormal results are displayed) Results for orders placed or performed during the hospital encounter of 04/01/23  Urinalysis, Routine w reflex microscopic -Urine, Clean Catch   Collection Time: 04/01/23 12:45 AM  Result Value Ref Range   Color, Urine YELLOW YELLOW   APPearance CLEAR CLEAR   Specific Gravity, Urine 1.014 1.005 - 1.030   pH 6.0 5.0 - 8.0   Glucose, UA 500 (A) NEGATIVE mg/dL   Hgb urine dipstick TRACE (A) NEGATIVE   Bilirubin Urine NEGATIVE NEGATIVE   Ketones, ur NEGATIVE NEGATIVE mg/dL   Protein, ur 284 (A) NEGATIVE mg/dL   Nitrite NEGATIVE NEGATIVE   Leukocytes,Ua MODERATE (A) NEGATIVE   RBC / HPF 0-5 0 - 5 RBC/hpf   WBC, UA >50 0 - 5 WBC/hpf   Bacteria, UA NONE SEEN NONE SEEN   Squamous Epithelial / HPF 0-5 0 - 5 /HPF  Comprehensive metabolic panel   Collection Time: 04/01/23  9:20 PM  Result Value Ref Range   Sodium 138 135 - 145 mmol/L   Potassium 5.0 3.5 - 5.1 mmol/L   Chloride 105 98 - 111 mmol/L   CO2 23 22 - 32 mmol/L   Glucose, Bld 268 (H) 70 - 99  mg/dL   BUN 41 (H) 8 - 23 mg/dL   Creatinine, Ser 1.32 (H) 0.44 - 1.00 mg/dL   Calcium 8.8 (L) 8.9 - 10.3 mg/dL   Total Protein 7.2 6.5 - 8.1 g/dL   Albumin 4.1 3.5 - 5.0 g/dL   AST 17 15 - 41 U/L   ALT 14 0 - 44 U/L   Alkaline Phosphatase 128 (H) 38 - 126 U/L   Total Bilirubin 0.3 0.0 - 1.2 mg/dL   GFR, Estimated 19 (L) >60 mL/min   Anion gap 10 5 - 15  CBC   Collection Time: 04/01/23  9:20 PM  Result Value Ref Range   WBC 6.9 4.0 - 10.5 K/uL   RBC 2.92 (L) 3.87 - 5.11 MIL/uL   Hemoglobin 8.9 (L) 12.0 - 15.0 g/dL   HCT 44.0 (L) 10.2 - 72.5 %   MCV 96.6 80.0 - 100.0 fL   MCH 30.5 26.0 - 34.0 pg   MCHC 31.6 30.0 - 36.0 g/dL   RDW 36.6 44.0 - 34.7 %   Platelets 299 150 - 400 K/uL   nRBC 0.0 0.0 - 0.2 %  CBG monitoring, ED   Collection Time: 04/01/23  9:24 PM  Result Value Ref Range   Glucose-Capillary 235 (H) 70 - 99 mg/dL  Resp panel by RT-PCR (RSV, Flu A&B, Covid) Anterior Nasal Swab   Collection Time: 04/01/23 11:57 PM   Specimen: Anterior Nasal Swab  Result Value Ref Range   SARS Coronavirus 2 by RT PCR NEGATIVE NEGATIVE   Influenza A by PCR NEGATIVE NEGATIVE   Influenza B by PCR NEGATIVE NEGATIVE  Resp Syncytial Virus by PCR NEGATIVE NEGATIVE   DG Chest Portable 1 View Result Date: 04/02/2023 CLINICAL DATA:  Dyspnea EXAM: PORTABLE CHEST 1 VIEW COMPARISON:  03/25/2023 FINDINGS: Lung volumes are small. Stable elevation right hemidiaphragm. No superimposed confluent pulmonary infiltrate. No pneumothorax or pleural effusion. Cardiac size within normal limits. Pulmonary vascularity is normal. No acute bone IMPRESSION: 1. Pulmonary hypoinflation. Electronically Signed   By: Helyn Numbers M.D.   On: 04/02/2023 01:21   DG Chest 2 View Result Date: 03/25/2023 CLINICAL DATA:  Altered mental status.  Infection workup EXAM: CHEST - 2 VIEW COMPARISON:  X-ray 03/12/2023 FINDINGS: Underinflation with elevated right hemidiaphragm. No consolidation, pneumothorax or effusion. No edema.  Stable cardiopericardial silhouette. Interstitial changes are again seen and could be chronic. Fixation hardware seen along the lower cervical spine at the edge of the imaging field. Osteopenia with degenerative changes along the spine. Overlapping cardiac leads. IMPRESSION: Elevated right hemidiaphragm. Underinflation. Likely chronic interstitial changes. Electronically Signed   By: Karen Kays M.D.   On: 03/25/2023 16:06   CT Head Wo Contrast Result Date: 03/25/2023 CLINICAL DATA:  Worsening altered mental status for 2 days. Slurred speech. EXAM: CT HEAD WITHOUT CONTRAST TECHNIQUE: Contiguous axial images were obtained from the base of the skull through the vertex without intravenous contrast. RADIATION DOSE REDUCTION: This exam was performed according to the departmental dose-optimization program which includes automated exposure control, adjustment of the mA and/or kV according to patient size and/or use of iterative reconstruction technique. COMPARISON:  CT head 02/14/2023. FINDINGS: Brain: No acute intracranial hemorrhage. No CT evidence of acute infarct. Nonspecific hypoattenuation in the periventricular and subcortical white matter favored to reflect chronic microvascular ischemic changes. Remote lacunar infarct in the left basal ganglia. Similar appearance of encephalomalacia in the right parieto-occipital lobes no edema, mass effect, or midline shift. The basilar cisterns are patent. Ventricles: Prominence of the ventricles suggesting underlying parenchymal volume loss. Vascular: Atherosclerotic calcifications of the carotid siphons. No hyperdense vessel. Skull: No acute or aggressive finding. Orbits: Orbits are symmetric. Sinuses: Mucosal thickening in the bilateral maxillary and ethmoid sinuses as well as the left sphenoid sinus. Other: Mastoid air cells are clear. IMPRESSION: 1. No acute intracranial abnormality. 2. Chronic microvascular ischemic changes and parenchymal volume loss. 3. Remote lacunar  infarct in the left basal ganglia. 4. Similar appearance of encephalomalacia in the right parieto-occipital lobes. 5. Paranasal sinus disease is increased from prior. Electronically Signed   By: Emily Filbert M.D.   On: 03/25/2023 14:50   DG Chest 2 View Result Date: 03/12/2023 CLINICAL DATA:  Generalized weakness with cough EXAM: CHEST - 2 VIEW COMPARISON:  Chest radiograph dated 02/10/2023 FINDINGS: Low lung volumes with bronchovascular crowding. No focal consolidations. No pleural effusion or pneumothorax. The heart size and mediastinal contours are within normal limits. Cervical spinal fixation hardware appears intact. IMPRESSION: Low lung volumes with bronchovascular crowding. No focal consolidations. Electronically Signed   By: Agustin Cree M.D.   On: 03/12/2023 15:19     Radiology DG Chest Portable 1 View Result Date: 04/02/2023 CLINICAL DATA:  Dyspnea EXAM: PORTABLE CHEST 1 VIEW COMPARISON:  03/25/2023 FINDINGS: Lung volumes are small. Stable elevation right hemidiaphragm. No superimposed confluent pulmonary infiltrate. No pneumothorax or pleural effusion. Cardiac size within normal limits. Pulmonary vascularity is normal. No acute bone IMPRESSION: 1. Pulmonary hypoinflation. Electronically Signed   By: Helyn Numbers M.D.   On: 04/02/2023 01:21    Procedures Procedures    Medications Ordered in ED Medications  sodium  chloride 0.9 % bolus 500 mL (0 mLs Intravenous Stopped 04/02/23 0101)  cefTRIAXone (ROCEPHIN) 1 g in sodium chloride 0.9 % 100 mL IVPB (0 g Intravenous Stopped 04/02/23 0216)  haloperidol lactate (HALDOL) injection 1 mg (1 mg Intravenous Given 04/02/23 0233)  0.9 %  sodium chloride infusion ( Intravenous New Bag/Given 04/02/23 0236)    ED Course/ Medical Decision Making/ A&P                                 Medical Decision Making Patient with CKD and UTI and ams   Amount and/or Complexity of Data Reviewed Independent Historian:     Details: Daughter see above   External Data Reviewed: labs and notes.    Details: Previous ED visits reviewed  Labs: ordered.    Details: Urine is consistent with UTI, negative covid and flu.  Normal sodium 138, normal potassium 5, elevated creatinine 2.44. normal white count 6.9, normal hemoglobin 8.9, normal platelets   Radiology: ordered and independent interpretation performed.    Details: Negative CXR by me   Risk Prescription drug management. Decision regarding hospitalization. Risk Details: Will admit for UTI and AMS    Final Clinical Impression(s) / ED Diagnoses Final diagnoses:  Acute cystitis without hematuria  Altered mental status, unspecified altered mental status type   The patient appears reasonably stabilized for admission considering the current resources, flow, and capabilities available in the ED at this time, and I doubt any other [facility] requiring further screening and/or treatment in the ED prior to admission.  Rx / DC Orders ED Discharge Orders     None         Felicia Bloomquist, MD 04/02/23 [phone]

## 2023-04-02 NOTE — ED Notes (Signed)
 [facility] called for transport

## 2023-04-02 NOTE — ED Provider Notes (Signed)
  Physical Exam  BP (!) 164/53   Pulse 88   Temp 98.5 F (36.9 C)   Resp 19   SpO2 93%   Physical Exam  Procedures  Procedures  ED Course / MDM    Medical Decision Making Amount and/or Complexity of Data Reviewed Labs: ordered. Radiology: ordered.  Risk Prescription drug management. Decision regarding hospitalization.   Called to see patient.  Had a fall and was on the floor.  Patient cannot provide much history as to what happened.  Put back in bed.  Unsure if she hit her head.  Potentially some mild occipital tenderness.  Will get head CT and cervical spine CT due to age and being on Plavix.  Also has some mild tenderness to left hip more posteriorly.  Will get x-ray.  X-ray independently reported and do not see fracture. Discussed with Dr. Grace Isaac from radiology and no intracranial hemorrhage or cervical spine fracture.  Appears stable for transfer.       Benjiman Core, MD 04/02/23 [phone]

## 2023-04-02 NOTE — TOC Initial Note (Signed)
 Transition of Care [facility]) - Initial/Assessment Note    Patient Details  Name: Sandra [name] MRN: [id number] Date of Birth: 1933-10-31  Transition of Care [facility]) CM/SW Contact:    Beckie Busing, RN Phone Number:[phone]  04/02/2023, 3:59 PM  Clinical Narrative:                 [facility] consulted for patient with SNF recommendations. CM at bedside with patient , daughter and grandson. Patient/ daughter state that patient will go home at discharge. Patient currently has HH services with [facility] and wishes to resume services with them at discharge. Daughter states that she lives with patient and is able to provide 24 hr support. There are currently no other needs. TOC will continue to follow.   Expected Discharge Plan: Home w Home Health Services Barriers to Discharge: No Barriers Identified, Continued Medical Work up   Patient Goals and CMS Choice Patient states their goals for this hospitalization and ongoing recovery are:: Wants to go home with daughter   Choice offered to / list presented to : NA [place] ownership interest in [facility].provided to::  (n/a)    Expected Discharge Plan and Services In-house Referral: NA Discharge Planning Services: CM Consult   Living arrangements for the past 2 months: Single Family Home                 DME Arranged: N/A DME Agency: NA       HH Arranged:  (currently active with [facility]) HH Agency: [facility] Date [facility] Agency Contacted: 04/02/23 Time HH Agency Contacted: [id number] Representative spoke with at [facility] Agency: Brandi  Prior Living Arrangements/Services Living arrangements for the past 2 months: Single Family Home Lives with:: Adult Children Patient language and need for interpreter reviewed:: Yes Do you feel safe going back to the place where you live?: Yes      Need for Family Participation in Patient Care: Yes (Comment) Care giver support system in place?: Yes (comment) Current home services: Home PT,  Home OT Criminal Activity/Legal Involvement Pertinent to Current Situation/Hospitalization: No - Comment as needed  Activities of Daily Living      Permission Sought/Granted Permission sought to share information with : Family Supports Permission granted to share information with : Yes, Verbal Permission Granted  Share Information with NAME: Glenda Chroman     Permission granted to share info w Relationship: daughter  Permission granted to share info w Contact Information: [phone]  Emotional Assessment Appearance:: Appears stated age Attitude/Demeanor/Rapport: Engaged, Gracious Affect (typically observed): Pleasant Orientation: : Oriented to Self, Oriented to Place, Oriented to  Time, Oriented to Situation Alcohol / Substance Use: Not Applicable Psych Involvement: No (comment)  Admission diagnosis:  UTI (urinary tract infection) [N39.0] Acute cystitis without hematuria [N30.00] Altered mental status, unspecified altered mental status type [R41.82] Patient Active Problem List   Diagnosis Date Noted   History of CVA (cerebrovascular accident) 02/17/2023   CKD (chronic kidney disease) stage 4, GFR 15-29 ml/min (HCC) 02/17/2023   Subcapital fracture of hip, left, closed, initial encounter (HCC) 02/10/2023   Pulmonary hypertension (HCC) 01/19/2023   Seizure disorder, 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   AMS (altered mental status) 12/29/2022   Malnutrition of moderate degree 12/22/2022   Acute renal failure superimposed on stage 5 chronic kidney disease, not on chronic dialysis (HCC) 12/19/2022   Edema 08/21/2022   Kidney disease 06/02/2022   DNR (do not resuscitate) 01/04/2022   Type 2 diabetes mellitus with chronic kidney disease, with long-term  current use of insulin (HCC) 03/18/2021   Anxiety 03/18/2021   Dyslipidemia 03/18/2021   Depression 03/18/2021   Uncontrolled type 2 diabetes mellitus with hyperglycemia (HCC)    Benign essential HTN    Hypoalbuminemia due to  protein-calorie malnutrition (HCC)    Macrocytosis    Diabetic retinopathy of left eye associated with type 2 diabetes mellitus (HCC)    Cognitive impairment 06/22/2019   ICH (intracerebral hemorrhage) (HCC) - R occipital - HTN vs CAA 06/18/2019   TIA (transient ischemic attack) 04/26/2019   Scleroderma (HCC) 02/28/2016   Dyspnea 02/10/2016   Abnormal blood finding 02/10/2016   CAD (coronary artery disease) 02/10/2016   Osteoarthritis 02/10/2016   Nodule on liver 12/06/2015   ILD (interstitial lung disease) (HCC) 05/31/2015   Type 2 diabetes mellitus with hyperglycemia (HCC) 03/11/2015   Type 2 diabetes mellitus with hyperlipidemia (HCC) 03/04/2015   Vitamin D deficiency 10/01/2014   Leukocytopenia 05/31/2014   General weakness 05/31/2014   Lower abdominal pain 05/17/2014   Chronic right SI joint pain 09/22/2013   Vasculitis of skin 09/28/2012   History of subarachnoid hemorrhage 04/07/2012   Cerebral artery occlusion with cerebral infarction (HCC) 04/07/2012   UTI (urinary tract infection) 11/02/2011   Achilles tendon injury 11/27/2010   Back pain 07/01/2010   Breast pain 04/15/2010   GAIT DISTURBANCE 08/07/2009   Anxiety state 07/03/2009   VALVULAR HEART DISEASE 05/23/2008   NECK PAIN, CHRONIC 04/03/2008   DEGENERATIVE JOINT DISEASE, CERVICAL SPINE 06/23/2006   Osteoporosis 06/23/2006   Hyperlipidemia associated with type 2 diabetes mellitus (HCC) 05/12/2006   DEPRESSION 05/12/2006   Essential hypertension 05/12/2006   ALLERGIC RHINITIS 05/12/2006   ACID REFLUX DISEASE 05/12/2006   SCIATICA 05/12/2006   PCP:  [name], NP Pharmacy:   [id number] - [place], [place] - [address] [place] [address] Phone: [phone] Fax: [phone]     Social Drivers of Health (SDOH) Social History: SDOH Screenings   Food Insecurity: No Food Insecurity (02/11/2023)  Recent Concern: Food Insecurity - Food Insecurity Present (12/19/2022)   Housing: Low Risk  (02/11/2023)  Transportation Needs: No Transportation Needs (02/11/2023)  Utilities: Not At Risk (02/11/2023)  Depression (PHQ2-9): Low Risk  (03/05/2023)  Social Connections: Socially Isolated (02/11/2023)  Tobacco Use: Medium Risk (04/01/2023)   SDOH Interventions:     Readmission Risk Interventions    04/02/2023    3:54 PM 12/21/2022   12:26 PM  Readmission Risk Prevention Plan  Transportation Screening Complete Complete  PCP or Specialist Appt within 3-5 Days  Complete  HRI or Home Care Consult  Complete  Social Work Consult for Recovery Care Planning/Counseling  Complete  Palliative Care Screening  Not Applicable  Medication Review Oceanographer) Complete Complete  PCP or Specialist appointment within 3-5 days of discharge Complete   HRI or Home Care Consult Complete   SW Recovery Care/Counseling Consult Complete   Palliative Care Screening Not Applicable   Skilled Nursing Facility Complete

## 2023-04-02 NOTE — Evaluation (Signed)
 Physical Therapy Evaluation Patient Details Name: Sandra [name] MRN: [id number] DOB: [birth year] Today's Date: 04/02/2023  History of Present Illness  89 y.o. female who is being admitted to the hospital with toxic metabolic encephalopathy due to UTI. Pt with medical history significant for lipidemia, hypertension, CVA, diabetic retinopathy, CKD stage III, cognitive impairment, Takotsubo cardiomyopathy and multiple brain bleeds, L hip fracture s/p L hip hemiarthroplasty 02/12/23.  Clinical Impression  Pt admitted with above diagnosis. Min assist for bed mobility and to transfer to bedside commode then to recliner with RW. Pt oriented to self, not able to state full birthdate (gave month only), not oriented to location. Per prior PT eval in January, she lives with her daughter. Depending on progress, she may benefit from continued inpatient follow up therapy, <3 hours/day.  Pt currently with functional limitations due to the deficits listed below (see PT Problem List). Pt will benefit from acute skilled PT to increase their independence and safety with mobility to allow discharge.           If plan is discharge home, recommend the following: A lot of help with walking and/or transfers;A lot of help with bathing/dressing/bathroom;Assistance with cooking/housework;Assist for transportation;Help with stairs or ramp for entrance   Can travel by private vehicle   No    Equipment Recommendations None recommended by PT  Recommendations for Other Services       Functional Status Assessment Patient has had a recent decline in their functional status and demonstrates the ability to make significant improvements in function in a reasonable and predictable amount of time.     Precautions / Restrictions Precautions Precautions: Fall Recall of Precautions/Restrictions: Impaired Precaution/Restrictions Comments: pt reported falling and breaking hip recently (chart review shows hip fx 1/23/ 25); pt not  able to recall if she's had other falls in past 6 months Restrictions Weight Bearing Restrictions Per Provider Order: No      Mobility  Bed Mobility Overal bed mobility: Needs Assistance Bed Mobility: Supine to Sit     Supine to sit: Min assist     General bed mobility comments: assist to raise trunk    Transfers Overall transfer level: Needs assistance Equipment used: Rolling walker (2 wheels) Transfers: Sit to/from Stand, Bed to chair/wheelchair/BSC Sit to Stand: Min assist   Step pivot transfers: Min assist       General transfer comment: assist to rise and to steady; step pivot to bedside commode and then pivotal steps to recliner; SpO2 95% on room air    Ambulation/Gait                  Stairs            Wheelchair Mobility     Tilt Bed    Modified Rankin (Stroke Patients Only)       Balance Overall balance assessment: Needs assistance, History of Falls Sitting-balance support: Feet supported, No upper extremity supported Sitting balance-Leahy Scale: Fair     Standing balance support: Bilateral upper extremity supported, During functional activity, Reliant on assistive device for balance Standing balance-Leahy Scale: Poor                               Pertinent Vitals/Pain Pain Assessment Pain Assessment: Faces Faces Pain Scale: No hurt    Home Living Family/patient expects to be discharged to:: Private residence Living Arrangements: Children Available Help at Discharge: Family;Available 24 hours/day Type of Home: House Home Access:  Stairs to enter Entrance Stairs-Rails: None Entrance Stairs-Number of Steps: 2   Home Layout: One level Home Equipment: Shower seat;Hand held Programmer, systems (2 wheels) Additional Comments: lives with daughter    Prior Function Prior Level of Function : Needs assist;History of Falls (last six months);Patient poor historian/Family not available  Cognitive Assist : Mobility  (cognitive);ADLs (cognitive) Mobility (Cognitive): Intermittent cues ADLs (Cognitive): Intermittent cues       Mobility Comments: has used RW since hip fx in January per pt, pt not oriented to location and couldn't state her brithdate so isn't a reliable historian, info obtained from recent PT eval February 12, 2023 ADLs Comments: sits for showers, has PRN assist from family for med mgmt/IADLs     Extremity/Trunk Assessment   Upper Extremity Assessment Upper Extremity Assessment: Generalized weakness    Lower Extremity Assessment Lower Extremity Assessment: Generalized weakness    Cervical / Trunk Assessment Cervical / Trunk Assessment: Kyphotic  Communication   Communication Communication: No apparent difficulties    Cognition Arousal: Alert Behavior During Therapy: WFL for tasks assessed/performed   PT - Cognitive impairments: No family/caregiver present to determine baseline, Memory, Orientation   Orientation impairments: Place, Situation                   PT - Cognition Comments: able to state her birth month but not day or year, not oriented to location/situation Following commands: Impaired Following commands impaired: Follows one step commands with increased time     Cueing Cueing Techniques: Verbal cues, Gestural cues, Tactile cues     General Comments      Exercises     Assessment/Plan    PT Assessment Patient needs continued PT services  PT Problem List Decreased balance;Decreased mobility;Decreased activity tolerance       PT Treatment Interventions Gait training;Functional mobility training;Therapeutic exercise;Therapeutic activities;Balance training;DME instruction    PT Goals (Current goals can be found in the Care Plan section)  Acute Rehab PT Goals PT Goal Formulation: Patient unable to participate in goal setting Time For Goal Achievement: 04/16/23 Potential to Achieve Goals: Fair    Frequency Min 3X/week     Co-evaluation                AM-PAC PT "6 Clicks" Mobility  Outcome Measure Help needed turning from your back to your side while in a flat bed without using bedrails?: A Little Help needed moving from lying on your back to sitting on the side of a flat bed without using bedrails?: A Little Help needed moving to and from a bed to a chair (including a wheelchair)?: A Little Help needed standing up from a chair using your arms (e.g., wheelchair or bedside chair)?: A Little Help needed to walk in hospital room?: A Lot Help needed climbing 3-5 steps with a railing? : A Lot 6 Click Score: 16    End of Session Equipment Utilized During Treatment: Gait belt Activity Tolerance: Patient limited by fatigue Patient left: in chair;with call bell/phone within reach;with chair alarm set Nurse Communication: Mobility status PT Visit Diagnosis: Other abnormalities of gait and mobility (R26.89);Difficulty in walking, not elsewhere classified (R26.2)    Time: 4696-2952 PT Time Calculation (min) (ACUTE ONLY): 22 min   Charges:   PT Evaluation $PT Eval Moderate Complexity: 1 Mod   PT General Charges $$ ACUTE PT VISIT: 1 Visit        Tamala Ser PT 04/02/2023  Acute Rehabilitation Services  Office [phone]

## 2023-04-03 DIAGNOSIS — R410 Disorientation, unspecified: Secondary | ICD-10-CM

## 2023-04-03 LAB — CBC
HCT: 24.4 % — ABNORMAL LOW (ref 36.0–46.0)
Hemoglobin: 7.8 g/dL — ABNORMAL LOW (ref 12.0–15.0)
MCH: 31 pg (ref 26.0–34.0)
MCHC: 32 g/dL (ref 30.0–36.0)
MCV: 96.8 fL (ref 80.0–100.0)
Platelets: 264 10*3/uL (ref 150–400)
RBC: 2.52 MIL/uL — ABNORMAL LOW (ref 3.87–5.11)
RDW: 13.9 % (ref 11.5–15.5)
WBC: 6.2 10*3/uL (ref 4.0–10.5)
nRBC: 0 % (ref 0.0–0.2)

## 2023-04-03 LAB — URINE CULTURE
Culture: NO GROWTH
Special Requests: NORMAL

## 2023-04-03 LAB — GLUCOSE, CAPILLARY
Glucose-Capillary: 133 mg/dL — ABNORMAL HIGH (ref 70–99)
Glucose-Capillary: 148 mg/dL — ABNORMAL HIGH (ref 70–99)
Glucose-Capillary: 198 mg/dL — ABNORMAL HIGH (ref 70–99)
Glucose-Capillary: 127 mg/dL — ABNORMAL HIGH (ref 70–99)

## 2023-04-03 LAB — BASIC METABOLIC PANEL
Anion gap: 8 (ref 5–15)
BUN: 36 mg/dL — ABNORMAL HIGH (ref 8–23)
CO2: 21 mmol/L — ABNORMAL LOW (ref 22–32)
Calcium: 8.4 mg/dL — ABNORMAL LOW (ref 8.9–10.3)
Chloride: 109 mmol/L (ref 98–111)
Creatinine, Ser: 1.96 mg/dL — ABNORMAL HIGH (ref 0.44–1.00)
GFR, Estimated: 24 mL/min — ABNORMAL LOW (ref >60)
Glucose, Bld: 142 mg/dL — ABNORMAL HIGH (ref 70–99)
Potassium: 3.8 mmol/L (ref 3.5–5.1)
Sodium: 138 mmol/L (ref 135–145)

## 2023-04-03 MED ORDER — FAMOTIDINE 20 MG PO TABS
10.0000 mg | ORAL_TABLET | Freq: Every day | ORAL | Status: DC
  Administered 2023-04-04: 10 mg via ORAL
  Filled 2023-04-03: qty 1

## 2023-04-03 NOTE — Evaluation (Signed)
 Occupational Therapy Evaluation Patient Details Name: Sandra [name] MRN: [id number] DOB: 02/21/1933 Today's Date: 04/03/2023   History of Present Illness   89 y.o. female who is being admitted to the hospital with toxic metabolic encephalopathy due to UTI. Pt with medical history significant for lipidemia, hypertension, CVA, diabetic retinopathy, CKD stage III, cognitive impairment, Takotsubo cardiomyopathy and multiple brain bleeds, L hip fracture s/p L hip hemiarthroplasty 02/12/23.     Clinical Impressions Patient is a 89 year old female who was admitted for above. Patient was living at home with daughter support prior level. Currently, patient is self limiting in participation during session. Patient reported feeling "unwell" but unable to attribute it to any particular. Patient was able to take steps on EOB with min A with patient declining to use bathroom or attempt to sit up in recliner. Patient was noted to have decreased functional activity tolernace, decreased ROM, decreased BUE strength, decreased endurance, decreased sitting balance, decreased standing balanced, decreased safety awareness, and decreased knowledge of AE/AD impacting participation in ADLs. Patient would continue to benefit from skilled OT services at this time while admitted and after d/c to address noted deficits in order to improve overall safety and independence in ADLs. Patient lives at home with daughter/grandson support and plans to have [facility] when discharged. OT to continue to follow.      If plan is discharge home, recommend the following:   A lot of help with bathing/dressing/bathroom;Assistance with cooking/housework;Direct supervision/assist for medications management;Assist for transportation;Direct supervision/assist for financial management;Help with stairs or ramp for entrance;A lot of help with walking and/or transfers     Functional Status Assessment   Patient has had a recent decline in their  functional status and demonstrates the ability to make significant improvements in function in a reasonable and predictable amount of time.     Equipment Recommendations   None recommended by OT      Precautions/Restrictions   Precautions Precautions: Fall Restrictions Weight Bearing Restrictions Per Provider Order: No     Mobility Bed Mobility Overal bed mobility: Needs Assistance Bed Mobility: Supine to Sit     Supine to sit: Min assist                 Balance Overall balance assessment: Needs assistance, History of Falls Sitting-balance support: Feet supported, No upper extremity supported Sitting balance-Leahy Scale: Fair     Standing balance support: Bilateral upper extremity supported, During functional activity, Reliant on assistive device for balance Standing balance-Leahy Scale: Poor       ADL either performed or assessed with clinical judgement   ADL Overall ADL's : Needs assistance/impaired Eating/Feeding: Set up;Bed level   Grooming: Bed level;Set up   Upper Body Bathing: Bed level;Contact guard assist   Lower Body Bathing: Bed level;Maximal assistance   Upper Body Dressing : Bed level;Contact guard assist   Lower Body Dressing: Bed level;Total assistance   Toilet Transfer: Minimal assistance Toilet Transfer Details (indicate cue type and reason): to take smalls steps at head of bed. Toileting- Clothing Manipulation and Hygiene: Bed level;Total assistance               Vision   Vision Assessment?: No apparent visual deficits            Pertinent Vitals/Pain Pain Assessment Pain Assessment: Faces Faces Pain Scale: No hurt     Extremity/Trunk Assessment Upper Extremity Assessment Upper Extremity Assessment: Overall WFL for tasks assessed   Lower Extremity Assessment Lower Extremity Assessment: Defer to PT  evaluation   Cervical / Trunk Assessment Cervical / Trunk Assessment: Kyphotic   Communication  Communication Communication: No apparent difficulties   Cognition Arousal: Alert Behavior During Therapy: WFL for tasks assessed/performed               OT - Cognition Comments: patient has a h/o dementia.                 Following commands: Impaired Following commands impaired: Follows one step commands with increased time                Home Living Family/patient expects to be discharged to:: Private residence Living Arrangements: Children Available Help at Discharge: Family;Available 24 hours/day Type of Home: House Home Access: Stairs to enter [employer] of Steps: 2 Entrance Stairs-Rails: None Home Layout: One level     Bathroom Shower/Tub: Chief Strategy Officer: Standard     Home Equipment: Shower seat;Hand held Programmer, systems (2 wheels)   Additional Comments: lives with daughter      Prior Functioning/Environment Prior Level of Function : Needs assist;History of Falls (last six months);Patient poor historian/Family not available  Cognitive Assist : Mobility (cognitive);ADLs (cognitive) Mobility (Cognitive): Intermittent cues ADLs (Cognitive): Intermittent cues         ADLs Comments: sits for showers, has PRN assist from family for med mgmt/IADLs    OT Problem List: Decreased strength;Decreased knowledge of precautions;Decreased safety awareness;Cardiopulmonary status limiting activity;Decreased activity tolerance;Decreased knowledge of use of DME or AE;Impaired balance (sitting and/or standing)   OT Treatment/Interventions: Self-care/ADL training;DME and/or AE instruction;Balance training;Therapeutic activities;Energy conservation;Patient/family education      OT Goals(Current goals can be found in the care plan section)   Acute Rehab OT Goals Patient Stated Goal: to go home OT Goal Formulation: Patient unable to participate in goal setting Time For Goal Achievement: 04/17/23 Potential to Achieve  Goals: Fair   OT Frequency:  Min 1X/week       AM-PAC OT "6 Clicks" Daily Activity     Outcome Measure Help from another person eating meals?: None Help from another person taking care of personal grooming?: A Little Help from another person toileting, which includes using toliet, bedpan, or urinal?: A Lot Help from another person bathing (including washing, rinsing, drying)?: A Lot Help from another person to put on and taking off regular upper body clothing?: A Little Help from another person to put on and taking off regular lower body clothing?: A Lot 6 Click Score: 16   End of Session Equipment Utilized During Treatment: Gait belt;Rolling walker (2 wheels) Nurse Communication: Mobility status  Activity Tolerance: Patient tolerated treatment well Patient left: in chair;with call bell/phone within reach  OT Visit Diagnosis: Unsteadiness on feet (R26.81);Other abnormalities of gait and mobility (R26.89)                Time: 1610-9604 OT Time Calculation (min): 12 min Charges:  OT General Charges $OT Visit: 1 Visit OT Evaluation $OT Eval Low Complexity: 1 Low  Gladyse Corvin OTR/L, MS Acute Rehabilitation Department Office# [phone]   Selinda Flavin 04/03/2023, 12:31 PM

## 2023-04-03 NOTE — Plan of Care (Signed)

## 2023-04-03 NOTE — Progress Notes (Signed)
 [facility] Progress Note  Patient: Sandra [name]     WJX:[id number]  DOA: 04/01/2023   PCP: [name], NP       Brief hospital course: Chera Slivka is a 89 y.o. female with medical history significant for lipidemia, hypertension, CVA, diabetic retinopathy, CKD stage III, cognitive impairment, Takotsubo cardiomyopathy and multiple brain bleeds who was brought to the hospital for confusion.  Apparently the patient was diagnosed with a UTI when seen in the ED on 3/6.  There were also complaints of the patient falling at home. The patient has had episodes of confusion and slurred speech at home.  UA was repeated in the ED and showed greater than 50 WBCs.  The patient was felt to have acute encephalopathy secondary to possibly a UTI.She was transferred from [facility] to [facility].  Subjective:  No complaints of headache, nausea, vomiting, dysuria or diarrhea.  She states that she has been getting confused occasionally at home.  She also admits to having slurred speech typically after she takes her "nerve pill".  Assessment and Plan: Principal Problem:   Confusion -Possibly delirium or worsening dementia - The patient is awake alert and oriented on my exam and aware enough to tell me that she has occasional confusion at home - Also noted to take clonazepam and hydroxyzine which may be adding to her confusion-will hold this - CT head performed on 3/6 and again on 3/14-no acute abnormality found  Active Problems:   UTI (urinary tract infection? -The patient has greater than 50 WBCs however no bacteria & no nitrite on the urinalysis -She has had no dysuria, increased frequency of micturition, hematuria or suprapubic pain-no complaints of fevers -DC ceftriaxone   Diastolic heart failure - Toprol continued - Holding Lasix  Anemia, normocytic - Follow-probably anemia of chronic disease  Diabetes mellitus type 2 - Continue insulin sliding  scale  Multiple falls at home and a fall in the hospital - PT eval    Code Status: Limited: Do not attempt resuscitation (DNR) -DNR-LIMITED -Do Not Intubate/DNI  Total time on patient care: 35 minutes DVT prophylaxis:  heparin injection 5,000 Units Start: 04/02/23 1400     Objective:   Vitals:   04/02/23 1402 04/02/23 1457 04/02/23 2150 04/03/23 0814  BP:  (!) 158/49 (!) 160/79 (!) 153/59  Pulse:  66 78 77  Resp:  15 18   Temp:  98 F (36.7 C) 98.5 F (36.9 C) 98.9 F (37.2 C)  TempSrc:   Oral Oral  SpO2:  100% 97% 96%  Weight: 42.2 kg      Filed Weights   04/02/23 1402  Weight: 42.2 kg   Exam: General exam: Appears comfortable  HEENT: oral mucosa moist Respiratory system: Clear to auscultation.  Cardiovascular system: S1 & S2 heard  Gastrointestinal system: Abdomen soft, non-tender, nondistended. Normal bowel sounds   Extremities: No cyanosis, clubbing or edema Psychiatry:  Mood & affect appropriate.      CBC: Recent Labs  Lab 04/01/23 2120 04/03/23 0705  WBC 6.9 6.2  HGB 8.9* 7.8*  HCT 28.2* 24.4*  MCV 96.6 96.8  PLT 299 264   Basic Metabolic Panel: Recent Labs  Lab 04/01/23 2120 04/03/23 0705  NA 138 138  K 5.0 3.8  CL 105 109  CO2 23 21*  GLUCOSE 268* 142*  BUN 41* 36*  CREATININE 2.44* 1.96*  CALCIUM 8.8* 8.4*     Scheduled Meds:  atorvastatin  80 mg Oral QPM   citalopram  10 mg Oral Daily   clopidogrel  75 mg Oral Daily   famotidine  40 mg Oral Daily   fluconazole  200 mg Oral Daily   heparin  5,000 Units Subcutaneous Q8H   insulin aspart  0-15 Units Subcutaneous TID WC   insulin aspart  0-5 Units Subcutaneous QHS   metoprolol succinate  25 mg Oral Daily    Imaging and lab data personally reviewed   Author: Calvert Cantor  04/03/2023 11:42 AM  To contact [facility]>   Check the care team in [facility] and look for the attending/consulting [facility] provider listed  Log into [email or web address] and use [place]'s universal password    Go to> "[facility]"  and find provider  If you still have difficulty reaching the provider, please page the [facility] (Director on Call) for the Hospitalists listed on amion

## 2023-04-04 ENCOUNTER — Inpatient Hospital Stay (HOSPITAL_COMMUNITY)

## 2023-04-04 DIAGNOSIS — R410 Disorientation, unspecified: Secondary | ICD-10-CM | POA: Diagnosis not present

## 2023-04-04 LAB — BASIC METABOLIC PANEL
Anion gap: 11 (ref 5–15)
BUN: 33 mg/dL — ABNORMAL HIGH (ref 8–23)
CO2: 21 mmol/L — ABNORMAL LOW (ref 22–32)
Calcium: 8.7 mg/dL — ABNORMAL LOW (ref 8.9–10.3)
Chloride: 108 mmol/L (ref 98–111)
Creatinine, Ser: 1.95 mg/dL — ABNORMAL HIGH (ref 0.44–1.00)
GFR, Estimated: 24 mL/min — ABNORMAL LOW (ref >60)
Glucose, Bld: 205 mg/dL — ABNORMAL HIGH (ref 70–99)
Potassium: 3.5 mmol/L (ref 3.5–5.1)
Sodium: 140 mmol/L (ref 135–145)

## 2023-04-04 LAB — CBC
HCT: 25.8 % — ABNORMAL LOW (ref 36.0–46.0)
Hemoglobin: 8.2 g/dL — ABNORMAL LOW (ref 12.0–15.0)
MCH: 31.3 pg (ref 26.0–34.0)
MCHC: 31.8 g/dL (ref 30.0–36.0)
MCV: 98.5 fL (ref 80.0–100.0)
Platelets: 258 10*3/uL (ref 150–400)
RBC: 2.62 MIL/uL — ABNORMAL LOW (ref 3.87–5.11)
RDW: 13.9 % (ref 11.5–15.5)
WBC: 6 10*3/uL (ref 4.0–10.5)
nRBC: 0 % (ref 0.0–0.2)

## 2023-04-04 LAB — GLUCOSE, CAPILLARY
Glucose-Capillary: 185 mg/dL — ABNORMAL HIGH (ref 70–99)
Glucose-Capillary: 113 mg/dL — ABNORMAL HIGH (ref 70–99)

## 2023-04-04 MED ORDER — RISPERIDONE 0.5 MG PO TBDP: 0.5000 mg | ORAL_TABLET | Freq: Every day | ORAL | Status: DC

## 2023-04-04 MED ORDER — RISPERIDONE 0.5 MG PO TBDP: 0.5000 mg | ORAL_TABLET | Freq: Every day | ORAL | 0 refills | Status: DC

## 2023-04-04 NOTE — Progress Notes (Signed)
       Overnight   NAME: Sandra [name] MRN: [id number] DOB : 05/20/1933    Date of Service   04/04/2023   HPI/Events of Note    Notified by RN for unwitnessed fall. RN stated "found on the floor in a sitting position by the door in her room. No noted injuries."  Patient is on blood thinner. RN notified family.  Patient has no deformities, contusions, abrasions, penetrations, bruising, tears, lacerations, swelling. Patient mentation at baseline.  Due to Blood thinner and unwitnessed fall, CT head is ordered.    Interventions/ Plan   CT Head  Fall precautions       Chinita Greenland BSN MSNA MSN ACNPC-AG Acute Care Nurse Practitioner [facility]

## 2023-04-04 NOTE — Discharge Summary (Addendum)
 Physician Discharge Summary  Tyanna Hach BMW:[id number] DOB: 1933-09-22 DOA: 04/01/2023  PCP: [name], NP  Admit date: 04/01/2023 Discharge date: 04/04/2023 Discharging to: home Recommendations for Outpatient Follow-up:  Will need outpt follow up of dementia with medication adjustments  Consults:  none Procedures:  none   Discharge Diagnoses:   Principal Problem:   Confusion- probably early dementia Active Problems:   Chronic diastolic CHF   Type 2 diabetes mellitus with hyperlipidemia (HCC)  Frequent falls   Brief hospital course: Sandra [name] is a 89 y.o. female with medical history significant for lipidemia, hypertension, CVA, diabetic retinopathy, CKD stage III, cognitive impairment, Takotsubo cardiomyopathy and multiple brain bleeds who was brought to the hospital for confusion.  Apparently the patient was diagnosed with a UTI when seen in the ED on 3/6.  There were also complaints of the patient falling at home. The patient has had episodes of confusion and slurred speech at home.  UA was repeated in the ED and showed greater than 50 WBCs.  The patient was felt to have acute encephalopathy secondary to possibly a UTI.She was transferred from [facility] to [facility].   Subjective:  Noted to have a fall and confusion this AM.    Assessment and Plan: Principal Problem:   Confusion - Possibly early dementia - The patient is awake alert and oriented on my exam and aware enough to tell me that she has occasional confusion at home - Also noted to take clonazepam and hydroxyzine which may be adding to her confusion  - CT head performed on 3/6 and again on 3/14-no acute abnormality found - nurses and daughter have noted to have increasing confusion in the evenings at home and in the hospital - have added Risperdal at bedtime to help control symptoms of sundowning - I have spoken with the daughter that she lives with, [name] and explained to her in  detail that he mother is likely developing dementia and likely will benefit from another medication other than Clonazepam which can worsen confusion in the elderly- She does not want to stop the Clonazepam as it is the only thing that helps her mother with the anxiety- She will speak with her neurologist about this  - of note, the daughter states that the patient's neurologist placed her on Celexa but she has stopped giving this to her because she felt it was making her mentation worse   Active Problems:   UTI (urinary tract infection? -The patient has greater than 50 WBCs however no bacteria & no nitrite on the urinalysis -She has had no dysuria, increased frequency of micturition, hematuria or suprapubic pain-no complaints of fevers - urine culture negative -DC ceftriaxone   Diastolic heart failure - Toprol, Hydralazine, Lasix   Anemia, normocytic - Follow-probably anemia of chronic disease   Diabetes mellitus type 2 - Insulin sliding scale   Multiple falls at home and a fall in the hospital - PT eval done- home health ordered           Discharge Instructions  Discharge Instructions     Diet - low sodium heart healthy   Complete by: As directed    Increase activity slowly   Complete by: As directed    No wound care   Complete by: As directed       Allergies as of 04/04/2023       Reactions   Cephalexin Nausea And Vomiting   Pt stated made severely sick, will never take again  Pioglitazone Swelling, Other (See Comments)   EDEMA   Celexa [citalopram] Other (See Comments)   Confusion   Other Other (See Comments)   "History of colitis," per family   Tape Other (See Comments)   SKIN IS VERY DRY!!   Nintedanib Diarrhea, Other (See Comments)   Ofev and Vargatef   Sertraline Nausea And Vomiting        Medication List     STOP taking these medications    citalopram 10 MG tablet Commonly known as: CELEXA   hydrOXYzine 10 MG tablet Commonly known as:  ATARAX       TAKE these medications    acetaminophen 500 MG tablet Commonly known as: TYLENOL Take 500 mg by mouth every 6 (six) hours as needed for mild pain (pain score 1-3) or headache.   albuterol 108 (90 Base) MCG/ACT inhaler Commonly known as: VENTOLIN HFA TAKE 2 PUFFS BY MOUTH EVERY 6 HOURS AS NEEDED FOR WHEEZE OR SHORTNESS OF BREATH   aspirin EC 81 MG tablet Take 81 mg by mouth daily. Swallow whole.   atorvastatin 80 MG tablet Commonly known as: LIPITOR TAKE 1 TABLET EVERY DAY What changed: when to take this   clonazePAM 0.5 MG tablet Commonly known as: KLONOPIN Take 1 tablet (0.5 mg total) by mouth 2 (two) times daily as needed for anxiety. What changed:  when to take this additional instructions   clopidogrel 75 MG tablet Commonly known as: PLAVIX TAKE 1 TABLET EVERY DAY   famotidine 40 MG tablet Commonly known as: PEPCID TAKE 1 TABLET EVERY DAY What changed: when to take this   fluconazole 200 MG tablet Commonly known as: DIFLUCAN Take 1 tablet (200 mg total) by mouth daily for 14 days.   furosemide 20 MG tablet Commonly known as: LASIX Take 20 mg by mouth daily as needed (for swelling- ankles).   hydrALAZINE 25 MG tablet Commonly known as: APRESOLINE Take 1 tablet (25 mg total) by mouth 3 (three) times daily.   loperamide 2 MG capsule Commonly known as: IMODIUM Take 1 capsule (2 mg total) by mouth as needed for diarrhea or loose stools.   metoprolol succinate 25 MG 24 hr tablet Commonly known as: TOPROL-XL Take 1 tablet (25 mg total) by mouth daily. Take with or immediately following a meal. What changed: Another medication with the same name was removed. Continue taking this medication, and follow the directions you see here.   NovoLOG FlexPen 100 UNIT/ML FlexPen Generic drug: insulin aspart Inject 2-8 Units into the skin 3 (three) times daily with meals as needed for high blood sugar (per sliding scale, for a BGL GREATER THAN 250).    Systane Hydration PF 0.4-0.3 % Soln Generic drug: Polyethyl Glyc-Propyl Glyc PF Place 1 drop into both eyes 4 (four) times daily as needed (for dryness).            The results of significant diagnostics from this hospitalization (including imaging, microbiology, ancillary and laboratory) are listed below for reference.    CT HEAD WO CONTRAST ( ) Result Date: 04/04/2023 CLINICAL DATA:  Unwitnessed fall on blood thinner EXAM: CT HEAD WITHOUT CONTRAST TECHNIQUE: Contiguous axial images were obtained from the base of the skull through the vertex without intravenous contrast. RADIATION DOSE REDUCTION: This exam was performed according to the departmental dose-optimization program which includes automated exposure control, adjustment of the mA and/or kV according to patient size and/or use of iterative reconstruction technique. COMPARISON:  Two days ago FINDINGS: Brain: No evidence of acute infarction,  hemorrhage, hydrocephalus, extra-axial collection or mass lesion/mass effect. Right occipital infarct remotely. Confluent chronic small vessel ischemia in the deep cerebral white matter. Vascular: No hyperdense vessel or unexpected calcification. Skull: Normal. Negative for fracture or focal lesion. Sinuses/Orbits: No acute finding. IMPRESSION: No evidence of intracranial injury. Electronically Signed   By: Tiburcio Pea M.D.   On: 04/04/2023 08:51   DG Hip Unilat W or Wo Pelvis 2-3 Views Left Result Date: 04/02/2023 CLINICAL DATA:  Fall.  Left hip pain. EXAM: DG HIP (WITH OR WITHOUT PELVIS) 2-3V LEFT COMPARISON:  02/25/2023. FINDINGS: Pelvis is intact with normal and symmetric sacroiliac joints. No acute fracture or dislocation. No aggressive osseous lesion. Visualized sacral arcuate lines are unremarkable. Unremarkable symphysis pubis. Redemonstration of left total hip arthroplasty The hardware is intact. No periprosthetic fracture or lucency. No interval change in alignment, when compared to the  available recent prior examination. There are mild degenerative changes of the right hip joint without significant joint space narrowing. Osteophytosis of the superior acetabulum. No radiopaque foreign bodies. IMPRESSION: *No acute osseous abnormality of the pelvis or left hip joint. Electronically Signed   By: Jules Schick M.D.   On: 04/02/2023 10:09   CT Head Wo Contrast Result Date: 04/02/2023 CLINICAL DATA:  Dementia, UTI, and fall.  Head and neck trauma. EXAM: CT HEAD WITHOUT CONTRAST CT CERVICAL SPINE WITHOUT CONTRAST TECHNIQUE: Multidetector CT imaging of the head and cervical spine was performed following the standard protocol without intravenous contrast. Multiplanar CT image reconstructions of the cervical spine were also generated. RADIATION DOSE REDUCTION: This exam was performed according to the departmental dose-optimization program which includes automated exposure control, adjustment of the mA and/or kV according to patient size and/or use of iterative reconstruction technique. COMPARISON:  01/25/2023 FINDINGS: CT HEAD FINDINGS Brain: No evidence of acute infarction, hemorrhage, hydrocephalus, extra-axial collection or mass lesion/mass effect. Chronic small vessel ischemia which is confluent in the deep white matter. Chronic infarct centered at the right temporal occipital cortex. Generalized cerebral volume loss. Vascular: No hyperdense vessel or unexpected calcification. Skull: No acute fracture Sinuses/Orbits: No evidence of injury CT CERVICAL SPINE FINDINGS Alignment: No traumatic malalignment Skull base and vertebrae: No acute fracture. C6-7 ACDF with solid arthrodesis. Facet ankylosis at C3-4 and C4-5. Soft tissues and spinal canal: No prevertebral fluid or swelling. No visible canal hematoma. Disc levels:  Generalized degenerative endplate and facet spurring. Upper chest: No visible injury IMPRESSION: No evidence of acute intracranial or cervical spine injury. Electronically Signed   By:  Tiburcio Pea M.D.   On: 04/02/2023 08:31   CT Cervical Spine Wo Contrast Result Date: 04/02/2023 CLINICAL DATA:  Dementia, UTI, and fall.  Head and neck trauma. EXAM: CT HEAD WITHOUT CONTRAST CT CERVICAL SPINE WITHOUT CONTRAST TECHNIQUE: Multidetector CT imaging of the head and cervical spine was performed following the standard protocol without intravenous contrast. Multiplanar CT image reconstructions of the cervical spine were also generated. RADIATION DOSE REDUCTION: This exam was performed according to the departmental dose-optimization program which includes automated exposure control, adjustment of the mA and/or kV according to patient size and/or use of iterative reconstruction technique. COMPARISON:  01/25/2023 FINDINGS: CT HEAD FINDINGS Brain: No evidence of acute infarction, hemorrhage, hydrocephalus, extra-axial collection or mass lesion/mass effect. Chronic small vessel ischemia which is confluent in the deep white matter. Chronic infarct centered at the right temporal occipital cortex. Generalized cerebral volume loss. Vascular: No hyperdense vessel or unexpected calcification. Skull: No acute fracture Sinuses/Orbits: No evidence of injury CT CERVICAL  SPINE FINDINGS Alignment: No traumatic malalignment Skull base and vertebrae: No acute fracture. C6-7 ACDF with solid arthrodesis. Facet ankylosis at C3-4 and C4-5. Soft tissues and spinal canal: No prevertebral fluid or swelling. No visible canal hematoma. Disc levels:  Generalized degenerative endplate and facet spurring. Upper chest: No visible injury IMPRESSION: No evidence of acute intracranial or cervical spine injury. Electronically Signed   By: Tiburcio Pea M.D.   On: 04/02/2023 08:31   DG Chest Portable 1 View Result Date: 04/02/2023 CLINICAL DATA:  Dyspnea EXAM: PORTABLE CHEST 1 VIEW COMPARISON:  03/25/2023 FINDINGS: Lung volumes are small. Stable elevation right hemidiaphragm. No superimposed confluent pulmonary infiltrate. No  pneumothorax or pleural effusion. Cardiac size within normal limits. Pulmonary vascularity is normal. No acute bone IMPRESSION: 1. Pulmonary hypoinflation. Electronically Signed   By: Helyn Numbers M.D.   On: 04/02/2023 01:21   DG Chest 2 View Result Date: 03/25/2023 CLINICAL DATA:  Altered mental status.  Infection workup EXAM: CHEST - 2 VIEW COMPARISON:  X-ray 03/12/2023 FINDINGS: Underinflation with elevated right hemidiaphragm. No consolidation, pneumothorax or effusion. No edema. Stable cardiopericardial silhouette. Interstitial changes are again seen and could be chronic. Fixation hardware seen along the lower cervical spine at the edge of the imaging field. Osteopenia with degenerative changes along the spine. Overlapping cardiac leads. IMPRESSION: Elevated right hemidiaphragm. Underinflation. Likely chronic interstitial changes. Electronically Signed   By: Karen Kays M.D.   On: 03/25/2023 16:06   CT Head Wo Contrast Result Date: 03/25/2023 CLINICAL DATA:  Worsening altered mental status for 2 days. Slurred speech. EXAM: CT HEAD WITHOUT CONTRAST TECHNIQUE: Contiguous axial images were obtained from the base of the skull through the vertex without intravenous contrast. RADIATION DOSE REDUCTION: This exam was performed according to the departmental dose-optimization program which includes automated exposure control, adjustment of the mA and/or kV according to patient size and/or use of iterative reconstruction technique. COMPARISON:  CT head 02/14/2023. FINDINGS: Brain: No acute intracranial hemorrhage. No CT evidence of acute infarct. Nonspecific hypoattenuation in the periventricular and subcortical white matter favored to reflect chronic microvascular ischemic changes. Remote lacunar infarct in the left basal ganglia. Similar appearance of encephalomalacia in the right parieto-occipital lobes no edema, mass effect, or midline shift. The basilar cisterns are patent. Ventricles: Prominence of the  ventricles suggesting underlying parenchymal volume loss. Vascular: Atherosclerotic calcifications of the carotid siphons. No hyperdense vessel. Skull: No acute or aggressive finding. Orbits: Orbits are symmetric. Sinuses: Mucosal thickening in the bilateral maxillary and ethmoid sinuses as well as the left sphenoid sinus. Other: Mastoid air cells are clear. IMPRESSION: 1. No acute intracranial abnormality. 2. Chronic microvascular ischemic changes and parenchymal volume loss. 3. Remote lacunar infarct in the left basal ganglia. 4. Similar appearance of encephalomalacia in the right parieto-occipital lobes. 5. Paranasal sinus disease is increased from prior. Electronically Signed   By: Emily Filbert M.D.   On: 03/25/2023 14:50   DG Chest 2 View Result Date: 03/12/2023 CLINICAL DATA:  Generalized weakness with cough EXAM: CHEST - 2 VIEW COMPARISON:  Chest radiograph dated 02/10/2023 FINDINGS: Low lung volumes with bronchovascular crowding. No focal consolidations. No pleural effusion or pneumothorax. The heart size and mediastinal contours are within normal limits. Cervical spinal fixation hardware appears intact. IMPRESSION: Low lung volumes with bronchovascular crowding. No focal consolidations. Electronically Signed   By: Agustin Cree M.D.   On: 03/12/2023 15:19   Labs:   Basic Metabolic Panel: Recent Labs  Lab 04/01/23 2120 04/03/23 0705 04/04/23 0636  NA  138 138 140  K 5.0 3.8 3.5  CL 105 109 108  CO2 23 21* 21*  GLUCOSE 268* 142* 205*  BUN 41* 36* 33*  CREATININE 2.44* 1.96* 1.95*  CALCIUM 8.8* 8.4* 8.7*     CBC: Recent Labs  Lab 04/01/23 2120 04/03/23 0705 04/04/23 0636  WBC 6.9 6.2 6.0  HGB 8.9* 7.8* 8.2*  HCT 28.2* 24.4* 25.8*  MCV 96.6 96.8 98.5  PLT 299 264 258         SIGNED:   Calvert Cantor, MD  [facility] 04/04/2023, 10:25 AM Time taking on discharge: 50 minutes

## 2023-04-04 NOTE — Progress Notes (Signed)
 0600 - CNA standing in the hallway pointed to patient room and asking the patient ,"why did she get up". We both walked into  patient room where she was sitting on the floor by the door in a gown and pt gripper socks. Patient was stating she wanted to get up at 6:30. Patient has been alert to self only during the shift. Patient was assess for bruising or any noted broken bones. None noted. Patient able to move move all extremities, skull checked for hematoma or bruising. We lifted the patient from under her arms and placed her on the bed. Vital signs, and blood sugar were take and stable. Telemetry monitor still in place. Thirty minutes before, the aide had just rounded on patient and assisted her to the bedside commode, and filled her water pitcher. I spoke to patient daughter [name] who had called and wanted an update on her mom earlier in the shift. After the fall I called daughter and made her aware. Message sent to NP on-call. CT of head ordered.

## 2023-04-04 NOTE — TOC Transition Note (Signed)
 Transition of Care [facility]) - Discharge Note   Patient Details  Name: Sandra [name] MRN: [id number] Date of Birth: 02-13-1933  Transition of Care [facility]) CM/SW Contact:  Diona Browner, LCSW Phone Number: 04/04/2023, 10:47 AM   Clinical Narrative:    Pt medically ready to d/c home with dtr. [facility] will resume HHPT/OT services. Pt and dtr declined SNF placement. No further TOC needs.   Final next level of care: Home w Home Health Services Barriers to Discharge: Barriers Resolved   Patient Goals and CMS Choice Patient states their goals for this hospitalization and ongoing recovery are:: return home with dtr CMS Medicare.gov Compare Post Acute Care list provided to::  (na) Choice offered to / list presented to : NA [place] ownership interest in [facility].provided to::  (na)    Discharge Placement                       Discharge Plan and Services Additional resources added to the After Visit Summary for   In-house Referral: NA Discharge Planning Services: CM Consult            DME Arranged: N/A DME Agency: NA       HH Arranged: PT, OT HH Agency: [facility] Date HH Agency Contacted: 04/02/23 Time HH Agency Contacted: [phone] Representative spoke with at [facility] Agency: Brandi  Social Drivers of Health (SDOH) Interventions SDOH Screenings   Food Insecurity: Unknown (04/03/2023)  Housing: Unknown (04/03/2023)  Transportation Needs: Unknown (04/03/2023)  Utilities: Patient Unable To Answer (04/03/2023)  Depression (PHQ2-9): Low Risk  (03/05/2023)  Social Connections: Patient Unable To Answer (04/03/2023)  Recent Concern: Social Connections - Socially Isolated (02/11/2023)  Tobacco Use: Medium Risk (04/01/2023)     Readmission Risk Interventions    04/02/2023    3:54 PM 12/21/2022   12:26 PM  Readmission Risk Prevention Plan  Transportation Screening Complete Complete  PCP or Specialist Appt within 3-5 Days  Complete  HRI or Home Care Consult   Complete  Social Work Consult for Recovery Care Planning/Counseling  Complete  Palliative Care Screening  Not Applicable  Medication Review [employer]) Complete Complete  PCP or Specialist appointment within 3-5 days of discharge Complete   HRI or Home Care Consult Complete   SW Recovery Care/Counseling Consult Complete   Palliative Care Screening Not Applicable   Skilled Nursing Facility Complete

## 2023-04-06 ENCOUNTER — Encounter: Payer: Self-pay | Admitting: Neurology

## 2023-04-06 DIAGNOSIS — J849 Interstitial pulmonary disease, unspecified: Secondary | ICD-10-CM | POA: Diagnosis not present

## 2023-04-06 DIAGNOSIS — I420 Dilated cardiomyopathy: Secondary | ICD-10-CM | POA: Diagnosis not present

## 2023-04-06 DIAGNOSIS — N183 Chronic kidney disease, stage 3 unspecified: Secondary | ICD-10-CM | POA: Diagnosis not present

## 2023-04-06 DIAGNOSIS — E113599 Type 2 diabetes mellitus with proliferative diabetic retinopathy without macular edema, unspecified eye: Secondary | ICD-10-CM | POA: Diagnosis not present

## 2023-04-06 DIAGNOSIS — I2510 Atherosclerotic heart disease of native coronary artery without angina pectoris: Secondary | ICD-10-CM | POA: Diagnosis not present

## 2023-04-06 DIAGNOSIS — E1122 Type 2 diabetes mellitus with diabetic chronic kidney disease: Secondary | ICD-10-CM | POA: Diagnosis not present

## 2023-04-06 DIAGNOSIS — M1990 Unspecified osteoarthritis, unspecified site: Secondary | ICD-10-CM | POA: Diagnosis not present

## 2023-04-06 DIAGNOSIS — I129 Hypertensive chronic kidney disease with stage 1 through stage 4 chronic kidney disease, or unspecified chronic kidney disease: Secondary | ICD-10-CM | POA: Diagnosis not present

## 2023-04-06 DIAGNOSIS — M80052D Age-related osteoporosis with current pathological fracture, left femur, subsequent encounter for fracture with routine healing: Secondary | ICD-10-CM | POA: Diagnosis not present

## 2023-04-07 ENCOUNTER — Encounter (HOSPITAL_COMMUNITY): Payer: Self-pay

## 2023-04-07 ENCOUNTER — Emergency Department (HOSPITAL_COMMUNITY)

## 2023-04-07 ENCOUNTER — Inpatient Hospital Stay (HOSPITAL_COMMUNITY)
Admission: EM | Admit: 2023-04-07 | Discharge: 2023-04-20 | DRG: 091 | Disposition: E | Attending: Internal Medicine | Admitting: Internal Medicine

## 2023-04-07 ENCOUNTER — Telehealth: Payer: Self-pay

## 2023-04-07 DIAGNOSIS — G928 Other toxic encephalopathy: Principal | ICD-10-CM | POA: Diagnosis present

## 2023-04-07 DIAGNOSIS — K219 Gastro-esophageal reflux disease without esophagitis: Secondary | ICD-10-CM | POA: Diagnosis present

## 2023-04-07 DIAGNOSIS — Z1152 Encounter for screening for COVID-19: Secondary | ICD-10-CM

## 2023-04-07 DIAGNOSIS — Z823 Family history of stroke: Secondary | ICD-10-CM

## 2023-04-07 DIAGNOSIS — N185 Chronic kidney disease, stage 5: Secondary | ICD-10-CM | POA: Diagnosis present

## 2023-04-07 DIAGNOSIS — I611 Nontraumatic intracerebral hemorrhage in hemisphere, cortical: Secondary | ICD-10-CM | POA: Diagnosis not present

## 2023-04-07 DIAGNOSIS — Z981 Arthrodesis status: Secondary | ICD-10-CM

## 2023-04-07 DIAGNOSIS — Z803 Family history of malignant neoplasm of breast: Secondary | ICD-10-CM

## 2023-04-07 DIAGNOSIS — T43595A Adverse effect of other antipsychotics and neuroleptics, initial encounter: Secondary | ICD-10-CM | POA: Diagnosis present

## 2023-04-07 DIAGNOSIS — Z66 Do not resuscitate: Secondary | ICD-10-CM | POA: Diagnosis present

## 2023-04-07 DIAGNOSIS — E1122 Type 2 diabetes mellitus with diabetic chronic kidney disease: Secondary | ICD-10-CM | POA: Diagnosis present

## 2023-04-07 DIAGNOSIS — G9389 Other specified disorders of brain: Secondary | ICD-10-CM | POA: Diagnosis not present

## 2023-04-07 DIAGNOSIS — E1165 Type 2 diabetes mellitus with hyperglycemia: Secondary | ICD-10-CM | POA: Diagnosis present

## 2023-04-07 DIAGNOSIS — Z96642 Presence of left artificial hip joint: Secondary | ICD-10-CM | POA: Diagnosis present

## 2023-04-07 DIAGNOSIS — I5032 Chronic diastolic (congestive) heart failure: Secondary | ICD-10-CM | POA: Diagnosis present

## 2023-04-07 DIAGNOSIS — E1169 Type 2 diabetes mellitus with other specified complication: Secondary | ICD-10-CM | POA: Diagnosis present

## 2023-04-07 DIAGNOSIS — Z801 Family history of malignant neoplasm of trachea, bronchus and lung: Secondary | ICD-10-CM

## 2023-04-07 DIAGNOSIS — I132 Hypertensive heart and chronic kidney disease with heart failure and with stage 5 chronic kidney disease, or end stage renal disease: Secondary | ICD-10-CM | POA: Diagnosis present

## 2023-04-07 DIAGNOSIS — E11319 Type 2 diabetes mellitus with unspecified diabetic retinopathy without macular edema: Secondary | ICD-10-CM | POA: Diagnosis present

## 2023-04-07 DIAGNOSIS — M349 Systemic sclerosis, unspecified: Secondary | ICD-10-CM | POA: Diagnosis present

## 2023-04-07 DIAGNOSIS — Z515 Encounter for palliative care: Secondary | ICD-10-CM | POA: Diagnosis not present

## 2023-04-07 DIAGNOSIS — F0394 Unspecified dementia, unspecified severity, with anxiety: Secondary | ICD-10-CM | POA: Diagnosis present

## 2023-04-07 DIAGNOSIS — R9389 Abnormal findings on diagnostic imaging of other specified body structures: Secondary | ICD-10-CM | POA: Diagnosis not present

## 2023-04-07 DIAGNOSIS — I6782 Cerebral ischemia: Secondary | ICD-10-CM | POA: Diagnosis not present

## 2023-04-07 DIAGNOSIS — N390 Urinary tract infection, site not specified: Secondary | ICD-10-CM | POA: Diagnosis present

## 2023-04-07 DIAGNOSIS — R4182 Altered mental status, unspecified: Principal | ICD-10-CM | POA: Diagnosis present

## 2023-04-07 DIAGNOSIS — Z8249 Family history of ischemic heart disease and other diseases of the circulatory system: Secondary | ICD-10-CM

## 2023-04-07 DIAGNOSIS — Z888 Allergy status to other drugs, medicaments and biological substances status: Secondary | ICD-10-CM

## 2023-04-07 DIAGNOSIS — F32A Depression, unspecified: Secondary | ICD-10-CM | POA: Diagnosis present

## 2023-04-07 DIAGNOSIS — N184 Chronic kidney disease, stage 4 (severe): Secondary | ICD-10-CM | POA: Diagnosis not present

## 2023-04-07 DIAGNOSIS — Z7902 Long term (current) use of antithrombotics/antiplatelets: Secondary | ICD-10-CM

## 2023-04-07 DIAGNOSIS — I615 Nontraumatic intracerebral hemorrhage, intraventricular: Secondary | ICD-10-CM | POA: Diagnosis present

## 2023-04-07 DIAGNOSIS — Z7901 Long term (current) use of anticoagulants: Secondary | ICD-10-CM

## 2023-04-07 DIAGNOSIS — D631 Anemia in chronic kidney disease: Secondary | ICD-10-CM | POA: Diagnosis present

## 2023-04-07 DIAGNOSIS — E785 Hyperlipidemia, unspecified: Secondary | ICD-10-CM | POA: Diagnosis present

## 2023-04-07 DIAGNOSIS — Z90710 Acquired absence of both cervix and uterus: Secondary | ICD-10-CM

## 2023-04-07 DIAGNOSIS — R404 Transient alteration of awareness: Secondary | ICD-10-CM | POA: Diagnosis not present

## 2023-04-07 DIAGNOSIS — Z79899 Other long term (current) drug therapy: Secondary | ICD-10-CM | POA: Diagnosis not present

## 2023-04-07 DIAGNOSIS — Z8673 Personal history of transient ischemic attack (TIA), and cerebral infarction without residual deficits: Secondary | ICD-10-CM

## 2023-04-07 DIAGNOSIS — R410 Disorientation, unspecified: Secondary | ICD-10-CM | POA: Diagnosis not present

## 2023-04-07 DIAGNOSIS — Z7982 Long term (current) use of aspirin: Secondary | ICD-10-CM

## 2023-04-07 DIAGNOSIS — F411 Generalized anxiety disorder: Secondary | ICD-10-CM | POA: Diagnosis present

## 2023-04-07 DIAGNOSIS — R531 Weakness: Secondary | ICD-10-CM | POA: Diagnosis present

## 2023-04-07 DIAGNOSIS — M810 Age-related osteoporosis without current pathological fracture: Secondary | ICD-10-CM | POA: Diagnosis present

## 2023-04-07 DIAGNOSIS — I10 Essential (primary) hypertension: Secondary | ICD-10-CM | POA: Diagnosis not present

## 2023-04-07 LAB — URINALYSIS, ROUTINE W REFLEX MICROSCOPIC
Bilirubin Urine: NEGATIVE
Glucose, UA: 150 mg/dL — AB
Ketones, ur: NEGATIVE mg/dL
Nitrite: NEGATIVE
Protein, ur: 100 mg/dL — AB
Specific Gravity, Urine: 1.013 (ref 1.005–1.030)
WBC, UA: >50 WBC/hpf (ref 0–5)
pH: 6 (ref 5.0–8.0)

## 2023-04-07 LAB — CBC WITH DIFFERENTIAL/PLATELET
Abs Immature Granulocytes: 0.02 10*3/uL (ref 0.00–0.07)
Basophils Absolute: 0 10*3/uL (ref 0.0–0.1)
Basophils Relative: 0 %
Eosinophils Absolute: 0.2 10*3/uL (ref 0.0–0.5)
Eosinophils Relative: 3 %
HCT: 26.9 % — ABNORMAL LOW (ref 36.0–46.0)
Hemoglobin: 8.8 g/dL — ABNORMAL LOW (ref 12.0–15.0)
Immature Granulocytes: 0 %
Lymphocytes Relative: 29 %
Lymphs Abs: 1.9 10*3/uL (ref 0.7–4.0)
MCH: 31.8 pg (ref 26.0–34.0)
MCHC: 32.7 g/dL (ref 30.0–36.0)
MCV: 97.1 fL (ref 80.0–100.0)
Monocytes Absolute: 0.5 10*3/uL (ref 0.1–1.0)
Monocytes Relative: 8 %
Neutro Abs: 3.9 10*3/uL (ref 1.7–7.7)
Neutrophils Relative %: 60 %
Platelets: 236 10*3/uL (ref 150–400)
RBC: 2.77 MIL/uL — ABNORMAL LOW (ref 3.87–5.11)
RDW: 13.9 % (ref 11.5–15.5)
WBC: 6.6 10*3/uL (ref 4.0–10.5)
nRBC: 0 % (ref 0.0–0.2)

## 2023-04-07 LAB — COMPREHENSIVE METABOLIC PANEL
ALT: 12 U/L (ref 0–44)
AST: 15 U/L (ref 15–41)
Albumin: 3.1 g/dL — ABNORMAL LOW (ref 3.5–5.0)
Alkaline Phosphatase: 103 U/L (ref 38–126)
Anion gap: 9 (ref 5–15)
BUN: 31 mg/dL — ABNORMAL HIGH (ref 8–23)
CO2: 23 mmol/L (ref 22–32)
Calcium: 8.6 mg/dL — ABNORMAL LOW (ref 8.9–10.3)
Chloride: 108 mmol/L (ref 98–111)
Creatinine, Ser: 2 mg/dL — ABNORMAL HIGH (ref 0.44–1.00)
GFR, Estimated: 24 mL/min — ABNORMAL LOW (ref >60)
Glucose, Bld: 183 mg/dL — ABNORMAL HIGH (ref 70–99)
Potassium: 4 mmol/L (ref 3.5–5.1)
Sodium: 140 mmol/L (ref 135–145)
Total Bilirubin: 0.4 mg/dL (ref 0.0–1.2)
Total Protein: 6.5 g/dL (ref 6.5–8.1)

## 2023-04-07 LAB — CBG MONITORING, ED: Glucose-Capillary: 136 mg/dL — ABNORMAL HIGH (ref 70–99)

## 2023-04-07 LAB — CBC
HCT: 26.8 % — ABNORMAL LOW (ref 36.0–46.0)
Hemoglobin: 8.6 g/dL — ABNORMAL LOW (ref 12.0–15.0)
MCH: 31.2 pg (ref 26.0–34.0)
MCHC: 32.1 g/dL (ref 30.0–36.0)
MCV: 97.1 fL (ref 80.0–100.0)
Platelets: 248 10*3/uL (ref 150–400)
RBC: 2.76 MIL/uL — ABNORMAL LOW (ref 3.87–5.11)
RDW: 14.1 % (ref 11.5–15.5)
WBC: 6.6 10*3/uL (ref 4.0–10.5)
nRBC: 0 % (ref 0.0–0.2)

## 2023-04-07 LAB — CREATININE, SERUM
Creatinine, Ser: 1.98 mg/dL — ABNORMAL HIGH (ref 0.44–1.00)
GFR, Estimated: 24 mL/min — ABNORMAL LOW (ref >60)

## 2023-04-07 LAB — LIPASE, BLOOD: Lipase: 30 U/L (ref 11–51)

## 2023-04-07 MED ORDER — MELATONIN 5 MG PO TABS: 5.0000 mg | ORAL_TABLET | Freq: Every evening | ORAL | Status: DC | PRN

## 2023-04-07 MED ORDER — SODIUM CHLORIDE 0.9 % IV SOLN
1.0000 g | Freq: Once | INTRAVENOUS | Status: AC
  Administered 2023-04-07: 1 g via INTRAVENOUS
  Filled 2023-04-07: qty 10

## 2023-04-07 MED ORDER — ENOXAPARIN SODIUM 30 MG/0.3ML IJ SOSY
30.0000 mg | PREFILLED_SYRINGE | INTRAMUSCULAR | Status: DC
  Administered 2023-04-07: 30 mg via SUBCUTANEOUS
  Filled 2023-04-07: qty 0.3

## 2023-04-07 MED ORDER — INSULIN ASPART 100 UNIT/ML IJ SOLN: 0.0000 [IU] | Freq: Every day | INTRAMUSCULAR | Status: DC

## 2023-04-07 MED ORDER — PROCHLORPERAZINE EDISYLATE 10 MG/2ML IJ SOLN: 5.0000 mg | Freq: Four times a day (QID) | INTRAMUSCULAR | Status: DC | PRN

## 2023-04-07 MED ORDER — POLYETHYLENE GLYCOL 3350 17 G PO PACK: 17.0000 g | PACK | Freq: Every day | ORAL | Status: DC | PRN

## 2023-04-07 MED ORDER — INSULIN ASPART 100 UNIT/ML IJ SOLN
0.0000 [IU] | Freq: Three times a day (TID) | INTRAMUSCULAR | Status: DC
  Administered 2023-04-08: 3 [IU] via SUBCUTANEOUS
  Administered 2023-04-08: 2 [IU] via SUBCUTANEOUS
  Administered 2023-04-08: 3 [IU] via SUBCUTANEOUS

## 2023-04-07 MED ORDER — ACETAMINOPHEN 325 MG PO TABS: 650.0000 mg | ORAL_TABLET | Freq: Four times a day (QID) | ORAL | Status: DC | PRN

## 2023-04-07 NOTE — ED Triage Notes (Signed)
 Patient BIB [facility] from home for altered mental status. Patient has a history of dementia, Patient is non verbal but following all commands. LKW for patient was 0400 this morning when she asked the daughter to help her to the bathroom.

## 2023-04-07 NOTE — H&P (Incomplete)
 History and Physical  Sandra [name] GNF:[id number] DOB: 1933/02/01 DOA: 04/07/2023  Referring physician: Jasmine Pang, PA-EDP  PCP: [name], NP  Outpatient Specialists: None Patient coming from: Home.  Chief Complaint: Altered mental status  HPI: Sandra [name] is a 89 y.o. female with medical history significant for chronic HFpEF, chronic depression, frequent falls, intracranial hemorrhage, CKD 4, anemia of chronic disease, cognitive impairment, takotsubo cardiomyopathy, was brought into the ER due to altered mental status.  Last known well was around 4 AM on 04/07/2023.  No reported new focal neurologic deficits.  Lethargic, minimally verbal, and more confused than baseline.  Associated with generalized weakness.  No reported subjective fevers or chills.  No nausea or vomiting reported.  In the ER, alert and confused.  However, she is more verbal in the ER.  UA positive for pyuria.  She was started on Rocephin empirically.  ED Course: Temperature 98.2.  BP 189/57, pulse 78, respiratory 18, O2 saturation 98% on room air.  Lab studies notable for glucose 183, BUN 31, creatinine 2.0, GFR 24.  Hemoglobin 8.6.  WBC 6.6, platelet count of 248.  Review of Systems: Review of systems as noted in the HPI. All other systems reviewed and are negative.   Past Medical History:  Diagnosis Date   Achilles tendon injury 11/27/2010   ALLERGIC RHINITIS 05/12/2006   Qualifier: Diagnosis of  By: Drue Novel MD, Nolon Rod.    Anxiety state 07/03/2009   Qualifier: Diagnosis of  By: Drue Novel MD, Jose E.    Back pain 07/01/2010   Chronic kidney disease (CKD), stage III (moderate) (HCC) 05/31/2014   Chronic right SI joint pain 09/22/2013   Colitis 2024   DEGENERATIVE JOINT DISEASE, CERVICAL SPINE 06/23/2006   Annotation: had a CAT scan with a cervical myelogram that show  left C6-7 and  C5-6 foraminal stenosis Qualifier: Diagnosis of  By: Drue Novel MD, Nolon Rod.    DEPRESSION 05/12/2006   Qualifier: Diagnosis of  By:  Drue Novel MD, Jose E.    DIABETES MELLITUS, TYPE II 05/12/2006   Now following w/ Endo at [facility]     DIABETIC  RETINOPATHY 04/27/2007   Qualifier: Diagnosis of  By: Janit Bern     Encephalopathy, hypertensive    Essential hypertension 05/12/2006   Qualifier: Diagnosis of  By: Drue Novel MD, Nolon Rod.    GAIT DISTURBANCE 08/07/2009   Qualifier: Diagnosis of  By: Drue Novel MD, Nolon Rod.    GERD (gastroesophageal reflux disease)    History of colonoscopy    History of CT scan    History of mammogram    History of MRI    Hyperlipidemia associated with type 2 diabetes mellitus (HCC) 05/12/2006   Qualifier: Diagnosis of  By: Drue Novel MD, Jose E.    ILD (interstitial lung disease) (HCC) 05/31/2015   Lobar cerebral hemorrhage (HCC) 06/22/2019   NECK PAIN, CHRONIC 04/03/2008   Qualifier: Diagnosis of  By: Drue Novel MD, Nolon Rod.    Osteoarthritis 02/10/2016   Osteopenia    Osteoporosis 06/23/2006   Annotation: had a bone density test in 08-2004.  T score was -2.4 Qualifier: Diagnosis of  By: Drue Novel MD, Nolon Rod     [facility] ([facility])    Scleroderma (HCC) 02/28/2016   Stroke [facility])    Takotsubo cardiomyopathy    s/p stress MI with stress induced CM with normal coronary arteries by cath 2007 with normalization of LVF by echo 04/2005   TIA (transient ischemic attack)    Vasculitis of skin  09/28/2012   Vitamin D deficiency 10/01/2014   Past Surgical History:  Procedure Laterality Date   ABDOMINAL HYSTERECTOMY  01/20/1975   no oophorectomy   ANTERIOR APPROACH HEMI HIP ARTHROPLASTY Left 02/11/2023   Procedure: ANTERIOR APPROACH HEMI HIP ARTHROPLASTY;  Surgeon: Kathryne Hitch, MD;  Location: [facility] OR;  Service: Orthopedics;  Laterality: Left;   APPENDECTOMY     CARDIAC CATHETERIZATION     CATARACT EXTRACTION, BILATERAL  01/19/2009   FRACTURE SURGERY     SPINAL FUSION  01/19/1998   Dr. Venetia Maxon   TONSILLECTOMY      Social History:  reports that she has never smoked. She has been exposed to tobacco  smoke. She has never used smokeless tobacco. She reports current alcohol use of about 1.0 standard drink of alcohol per week. She reports that she does not use drugs.   Allergies  Allergen Reactions   Cephalexin Nausea And Vomiting    Pt stated made severely sick, will never take again   Pioglitazone Swelling and Other (See Comments)    EDEMA   Celexa [Citalopram] Other (See Comments)    Confusion    Other Other (See Comments)    "History of colitis," per family   Tape Other (See Comments)    SKIN IS VERY DRY!!   Nintedanib Diarrhea and Other (See Comments)    Ofev and Vargatef   Sertraline Nausea And Vomiting    Family History  Problem Relation Age of Onset   Stroke Mother    Cancer Sister    Lung disease Sister    Intracerebral hemorrhage Daughter        assoc with post-partum   Hypertension Daughter    Breast cancer Other        ?Aunt   Cancer Other        breast?   Coronary artery disease Neg Hx       Prior to Admission medications   Medication Sig Start Date End Date Taking? Authorizing Provider  acetaminophen (TYLENOL) 500 MG tablet Take 500 mg by mouth every 6 (six) hours as needed for mild pain (pain score 1-3) or headache.    [provider]  albuterol (VENTOLIN HFA) 108 (90 Base) MCG/ACT inhaler TAKE 2 PUFFS BY MOUTH EVERY 6 HOURS AS NEEDED FOR WHEEZE OR SHORTNESS OF BREATH 04/01/23   Medina-Vargas, Monina C, NP  aspirin EC 81 MG tablet Take 81 mg by mouth daily. Swallow whole.    [provider]  atorvastatin (LIPITOR) 80 MG tablet TAKE 1 TABLET EVERY DAY Patient taking differently: Take 80 mg by mouth every evening. 11/26/22   Ngetich, Dinah C, NP  clonazePAM (KLONOPIN) 0.5 MG tablet Take 1 tablet (0.5 mg total) by mouth 2 (two) times daily as needed for anxiety. Patient taking differently: Take 0.5 mg by mouth See admin instructions. Take 0.5 mg by mouth once a day and an additional 0.5 mg once a day as needed for anxiety/panic symptoms  03/03/23   Ngetich, Dinah C, NP  clopidogrel (PLAVIX) 75 MG tablet TAKE 1 TABLET EVERY DAY 02/10/23   Ngetich, Dinah C, NP  famotidine (PEPCID) 40 MG tablet TAKE 1 TABLET EVERY DAY Patient taking differently: Take 40 mg by mouth at bedtime. 02/05/23   Ngetich, Dinah C, NP  fluconazole (DIFLUCAN) 200 MG tablet Take 1 tablet (200 mg total) by mouth daily for 14 days. 03/25/23 04/08/23  Roemhildt, Lorin T, PA-C  furosemide (LASIX) 20 MG tablet Take 20 mg by mouth daily as needed (for  swelling- ankles).    [provider]  hydrALAZINE (APRESOLINE) 25 MG tablet Take 1 tablet (25 mg total) by mouth 3 (three) times daily. 12/29/22 04/02/23  Carollee Herter, DO  loperamide (IMODIUM) 2 MG capsule Take 1 capsule (2 mg total) by mouth as needed for diarrhea or loose stools. 02/18/23   Rai, Delene Ruffini, MD  metoprolol succinate (TOPROL-XL) 25 MG 24 hr tablet Take 1 tablet (25 mg total) by mouth daily. Take with or immediately following a meal. Patient not taking: Reported on 04/02/2023 02/18/23   Rai, Delene Ruffini, MD  NOVOLOG FLEXPEN 100 UNIT/ML FlexPen Inject 2-8 Units into the skin 3 (three) times daily with meals as needed for high blood sugar (per sliding scale, for a BGL GREATER THAN 250). 02/18/21   [provider]  risperiDONE (RISPERDAL M-TABS) 0.5 MG disintegrating tablet Take 1 tablet (0.5 mg total) by mouth at bedtime. 04/04/23   Calvert Cantor, MD  SYSTANE HYDRATION PF 0.4-0.3 % SOLN Place 1 drop into both eyes 4 (four) times daily as needed (for dryness).    [provider]    Physical Exam: BP (!) 157/55   Pulse 60   Temp 98.2 F (36.8 C) (Oral)   Resp 17   Ht 5' (1.524 m)   Wt 42.2 kg   SpO2 98%   BMI 18.17 kg/m   General: 89 y.o. year-old female well developed well nourished in no acute distress.  Alert, verbal, and confused Cardiovascular: Regular rate and rhythm with no rubs or gallops.  No thyromegaly or JVD noted.  No lower extremity edema bilaterally. Respiratory: Clear  to auscultation with no wheezes or rales.  Poor inspiratory effort. Abdomen: Soft nontender nondistended with normal bowel sounds x4 quadrants. Muskuloskeletal: No cyanosis, clubbing or edema noted bilaterally Neuro: CN II-XII intact, strength, sensation, reflexes Skin: No ulcerative lesions noted or rashes Psychiatry: Judgement and insight appear altered. Mood is appropriate for condition and setting          Labs on Admission:  Basic Metabolic Panel: Recent Labs  Lab 04/01/23 2120 04/03/23 0705 04/04/23 0636 04/07/23 1521  NA 138 138 140 140  K 5.0 3.8 3.5 4.0  CL 105 109 108 108  CO2 23 21* 21* 23  GLUCOSE 268* 142* 205* 183*  BUN 41* 36* 33* 31*  CREATININE 2.44* 1.96* 1.95* 2.00*  CALCIUM 8.8* 8.4* 8.7* 8.6*   Liver Function Tests: Recent Labs  Lab 04/01/23 2120 04/07/23 1521  AST 17 15  ALT 14 12  ALKPHOS 128* 103  BILITOT 0.3 0.4  PROT 7.2 6.5  ALBUMIN 4.1 3.1*   Recent Labs  Lab 04/07/23 1521  LIPASE 30   No results for input(s): "AMMONIA" in the last 168 hours. CBC: Recent Labs  Lab 04/01/23 2120 04/03/23 0705 04/04/23 0636 04/07/23 1521  WBC 6.9 6.2 6.0 6.6  NEUTROABS  --   --   --  3.9  HGB 8.9* 7.8* 8.2* 8.8*  HCT 28.2* 24.4* 25.8* 26.9*  MCV 96.6 96.8 98.5 97.1  PLT 299 264 258 236   Cardiac Enzymes: No results for input(s): "CKTOTAL", "CKMB", "CKMBINDEX", "TROPONINI" in the last 168 hours.  BNP (last 3 results) Recent Labs    12/28/22 0704 12/29/22 0452 03/12/23 1515  BNP 509.4* 469.9* 348.7*    ProBNP (last 3 results) No results for input(s): "PROBNP" in the last 8760 hours.  CBG: Recent Labs  Lab 04/03/23 1141 04/03/23 1606 04/03/23 2103 04/04/23 0648 04/04/23 1125  GLUCAP 148* 198* 127*  185* 113*    Radiological Exams on Admission: CT Head Wo Contrast Result Date: 04/07/2023 CLINICAL DATA:  Altered level of consciousness EXAM: CT HEAD WITHOUT CONTRAST TECHNIQUE: Contiguous axial images were obtained from the base  of the skull through the vertex without intravenous contrast. RADIATION DOSE REDUCTION: This exam was performed according to the departmental dose-optimization program which includes automated exposure control, adjustment of the mA and/or kV according to patient size and/or use of iterative reconstruction technique. COMPARISON:  04/04/2023 FINDINGS: Brain: Stable chronic small-vessel ischemic changes throughout the periventricular white matter and encephalomalacia from chronic right occipital cortical infarct. The lateral ventricles and midline structures are stable. No acute extra-axial fluid collections. No mass effect. Vascular: No hyperdense vessel or unexpected calcification. Skull: Normal. Negative for fracture or focal lesion. Sinuses/Orbits: Mild mucoperiosteal thickening within the ethmoid and maxillary sinuses. Other: None. IMPRESSION: 1. Stable head CT, no acute process. Electronically Signed   By: Sharlet Salina M.D.   On: 04/07/2023 17:50   DG Chest 2 View Result Date: 04/07/2023 CLINICAL DATA:  Altered level of consciousness EXAM: CHEST - 2 VIEW COMPARISON:  04/01/2023 FINDINGS: Frontal and lateral views of the chest demonstrate a stable cardiac silhouette. Chronic elevation of the right hemidiaphragm. No acute airspace disease, effusion, or pneumothorax. No acute bony abnormalities. IMPRESSION: 1. Stable chest, no acute process. Electronically Signed   By: Sharlet Salina M.D.   On: 04/07/2023 16:18    EKG: I independently viewed the EKG done and my findings are as followed: Sinus rhythm 64.  Nonspecific ST-T changes.  QTc 458  Assessment/Plan Present on Admission:  AMS (altered mental status)  Principal Problem:   AMS (altered mental status)  Acute metabolic encephalopathy suspect secondary to presumptive UTI Continue to treat underlying condition Reorient as needed Fall precautions  Presumptive UTI, POA UA positive for pyuria Started on Rocephin empirically in the ER,  continue.  CKD 4 At baseline Avoid nephrotoxic agents, dehydration, and hypotension. Gentle IV fluid hydration LR 50 cc/h x 1 day  Physical debility PT OT assessment Fall precautions  Type 2 diabetes with hyperglycemia Last hemoglobin A1c 8.2 on 11/30/2022 Start insulin sliding scale Heart healthy carb modified diet  History of CVA Resume home Plavix and Lipitor  GERD Resume home Pepcid  Chronic anxiety Resume home regimen   Time: 75 minutes.   DVT prophylaxis: Subcu Lovenox daily  Code Status: DNR  Family Communication: Daughter at bedside.  Disposition Plan: Admitted to telemetry medical unit  Consults called: None.  Admission status: Observation status.   Status is: Observation    Darlin Drop MD [facility] Pager [phone]  If 7PM-7AM, please contact night-coverage [email or web address] Password [facility]  04/07/2023, 8:19 PM

## 2023-04-07 NOTE — ED Notes (Signed)
 Pt ambulated with the [facility] [place] with minimal assistance successfully.

## 2023-04-07 NOTE — ED Notes (Signed)
 Patient is A/Ox2, able to answer most questions, able to speak in full sentences. Patient asked if she heard me speak to her before she stated "yes" I asked patient why she didn't want to answer my questions before she stated she had nothing to say to me.

## 2023-04-07 NOTE — ED Provider Notes (Signed)
 [facility] Provider Note   CSN: [id number] Arrival date & time: 04/07/23  1342     History {Add pertinent medical, surgical, social history, OB history to HPI:1} Chief Complaint  Patient presents with   Altered Mental Status    Jeanmarie Mccowen is a 89 y.o. female patient with past medical history of hyperlipidemia, diastolic heart failure, chronic anemia, frequent falls, hip surgery 02/11/23, GERD, intracranial hemorrhage, depression, gait disturbance presenting to emergency room with complaint of altered mental status.  According to family member in room patient was acting normally yesterday able to speak as normal and ambulate as normal, when she was speaking to patient around 4 AM today her mother stopped answering questions.  She was able to ambulate, but very weak. Family thought she was just sleepy and let her rest. When they checked on her again she was so weak that she couldn't walk or stand. She was confused and had a hard time asking questions or following commands. No recognizing family as she normally dose. Normally she is A&Ox4 and ambulates with walker. Now she is not really speaking much.     Altered Mental Status      Home Medications Prior to Admission medications   Medication Sig Start Date End Date Taking? Authorizing Provider  acetaminophen (TYLENOL) 500 MG tablet Take 500 mg by mouth every 6 (six) hours as needed for mild pain (pain score 1-3) or headache.    [provider]  albuterol (VENTOLIN HFA) 108 (90 Base) MCG/ACT inhaler TAKE 2 PUFFS BY MOUTH EVERY 6 HOURS AS NEEDED FOR WHEEZE OR SHORTNESS OF BREATH 04/01/23   Medina-Vargas, Monina C, NP  aspirin EC 81 MG tablet Take 81 mg by mouth daily. Swallow whole.    [provider]  atorvastatin (LIPITOR) 80 MG tablet TAKE 1 TABLET EVERY DAY Patient taking differently: Take 80 mg by mouth every evening. 11/26/22   Ngetich, Dinah C, NP  clonazePAM (KLONOPIN)  0.5 MG tablet Take 1 tablet (0.5 mg total) by mouth 2 (two) times daily as needed for anxiety. Patient taking differently: Take 0.5 mg by mouth See admin instructions. Take 0.5 mg by mouth once a day and an additional 0.5 mg once a day as needed for anxiety/panic symptoms 03/03/23   Ngetich, Dinah C, NP  clopidogrel (PLAVIX) 75 MG tablet TAKE 1 TABLET EVERY DAY 02/10/23   Ngetich, Dinah C, NP  famotidine (PEPCID) 40 MG tablet TAKE 1 TABLET EVERY DAY Patient taking differently: Take 40 mg by mouth at bedtime. 02/05/23   Ngetich, Dinah C, NP  fluconazole (DIFLUCAN) 200 MG tablet Take 1 tablet (200 mg total) by mouth daily for 14 days. 03/25/23 04/08/23  Roemhildt, Lorin T, PA-C  furosemide (LASIX) 20 MG tablet Take 20 mg by mouth daily as needed (for swelling- ankles).    [provider]  hydrALAZINE (APRESOLINE) 25 MG tablet Take 1 tablet (25 mg total) by mouth 3 (three) times daily. 12/29/22 04/02/23  Carollee Herter, DO  loperamide (IMODIUM) 2 MG capsule Take 1 capsule (2 mg total) by mouth as needed for diarrhea or loose stools. 02/18/23   Rai, Delene Ruffini, MD  metoprolol succinate (TOPROL-XL) 25 MG 24 hr tablet Take 1 tablet (25 mg total) by mouth daily. Take with or immediately following a meal. Patient not taking: Reported on 04/02/2023 02/18/23   Rai, Delene Ruffini, MD  NOVOLOG FLEXPEN 100 UNIT/ML FlexPen Inject 2-8 Units into the skin 3 (three) times daily with meals  as needed for high blood sugar (per sliding scale, for a BGL GREATER THAN 250). 02/18/21   [provider]  risperiDONE (RISPERDAL M-TABS) 0.5 MG disintegrating tablet Take 1 tablet (0.5 mg total) by mouth at bedtime. 04/04/23   Calvert Cantor, MD  SYSTANE HYDRATION PF 0.4-0.3 % SOLN Place 1 drop into both eyes 4 (four) times daily as needed (for dryness).    [provider]      Allergies    Cephalexin, Pioglitazone, Celexa [citalopram], Other, Tape, Nintedanib, and Sertraline    Review of Systems   Review of  Systems  Physical Exam Updated Vital Signs BP (!) 167/74 (BP Location: Right Arm)   Pulse 64   Temp 97.9 F (36.6 C) (Oral)   Resp 16   Ht 5' (1.524 m)   Wt 42.2 kg   SpO2 100%   BMI 18.17 kg/m  Physical Exam Vitals and nursing note reviewed.  Constitutional:      General: She is not in acute distress.    Appearance: She is not toxic-appearing.  HENT:     Head: Normocephalic and atraumatic.  Eyes:     General: No scleral icterus.    Conjunctiva/sclera: Conjunctivae normal.  Cardiovascular:     Rate and Rhythm: Normal rate and regular rhythm.     Pulses: Normal pulses.     Heart sounds: Normal heart sounds.  Pulmonary:     Effort: Pulmonary effort is normal. No respiratory distress.     Breath sounds: Normal breath sounds.  Abdominal:     General: Abdomen is flat. Bowel sounds are normal.     Palpations: Abdomen is soft.     Tenderness: There is no abdominal tenderness.  Skin:    General: Skin is warm and dry.     Findings: No lesion.  Neurological:     General: No focal deficit present.     Mental Status: She is alert and oriented to person, place, and time. Mental status is at baseline.     Comments: No focal deficits. Following basic commands. Generalized weakness. Appears confused.      ED Results / Procedures / Treatments   Labs (all labs ordered are listed, but only abnormal results are displayed) Labs Reviewed  CBC WITH DIFFERENTIAL/PLATELET - Abnormal; Notable for the following components:      Result Value   RBC 2.77 (*)    Hemoglobin 8.8 (*)    HCT 26.9 (*)    All other components within normal limits  COMPREHENSIVE METABOLIC PANEL - Abnormal; Notable for the following components:   Glucose, Bld 183 (*)    BUN 31 (*)    Creatinine, Ser 2.00 (*)    Calcium 8.6 (*)    Albumin 3.1 (*)    GFR, Estimated 24 (*)    All other components within normal limits  URINALYSIS, ROUTINE W REFLEX MICROSCOPIC - Abnormal; Notable for the following components:    APPearance HAZY (*)    Glucose, UA 150 (*)    Hgb urine dipstick SMALL (*)    Protein, ur 100 (*)    Leukocytes,Ua MODERATE (*)    Bacteria, UA RARE (*)    All other components within normal limits  URINE CULTURE  LIPASE, BLOOD    EKG None  Radiology CT Head Wo Contrast Result Date: 04/07/2023 CLINICAL DATA:  Altered level of consciousness EXAM: CT HEAD WITHOUT CONTRAST TECHNIQUE: Contiguous axial images were obtained from the base of the skull through the vertex without intravenous contrast. RADIATION  DOSE REDUCTION: This exam was performed according to the departmental dose-optimization program which includes automated exposure control, adjustment of the mA and/or kV according to patient size and/or use of iterative reconstruction technique. COMPARISON:  04/04/2023 FINDINGS: Brain: Stable chronic small-vessel ischemic changes throughout the periventricular white matter and encephalomalacia from chronic right occipital cortical infarct. The lateral ventricles and midline structures are stable. No acute extra-axial fluid collections. No mass effect. Vascular: No hyperdense vessel or unexpected calcification. Skull: Normal. Negative for fracture or focal lesion. Sinuses/Orbits: Mild mucoperiosteal thickening within the ethmoid and maxillary sinuses. Other: None. IMPRESSION: 1. Stable head CT, no acute process. Electronically Signed   By: Sharlet Salina M.D.   On: 04/07/2023 17:50   DG Chest 2 View Result Date: 04/07/2023 CLINICAL DATA:  Altered level of consciousness EXAM: CHEST - 2 VIEW COMPARISON:  04/01/2023 FINDINGS: Frontal and lateral views of the chest demonstrate a stable cardiac silhouette. Chronic elevation of the right hemidiaphragm. No acute airspace disease, effusion, or pneumothorax. No acute bony abnormalities. IMPRESSION: 1. Stable chest, no acute process. Electronically Signed   By: Sharlet Salina M.D.   On: 04/07/2023 16:18    Procedures Procedures  {Document cardiac monitor,  telemetry assessment procedure when appropriate:1}  Medications Ordered in ED Medications - No data to display  ED Course/ Medical Decision Making/ A&P   {   Click here for ABCD2, HEART and other calculatorsREFRESH Note before signing :1}                              Medical Decision Making Amount and/or Complexity of Data Reviewed Labs: ordered. Radiology: ordered.   Hayley Horn 89 y.o. presented today for AMS. Working DDx that I considered at this time includes, but not limited to, CVA/TIA, arrhythmia, vertigo, medication s/e, orthostatic hypotension, electrolyte abnormalities, dehydration, URI, ACS, UTI, anemia   R/o DDx: These are considered less likely than current impression due to history of present illness, physical exam, lab/imaging findings   Review of prior external notes: Reviewed recent admission note 04/01/2023 for acute cystitis and AMS  Pmhx: hyperlipidemia, diastolic heart failure, chronic anemia, frequent falls, hip surgery 02/11/23, GERD, intracranial hemorrhage, depression, gait disturbance  Unique Tests and My Interpretation:  CBC with no leukocytosis.  Hemoglobin 8.8 which is comparable to prior recent labs.  CMP with creatinine of 2 which is comparable to prior recent labs.  UA is positive for leukocytes, white blood cells and bacteria.  Small amount of hemoglobin.  Will send for urine culture.  EKG: Rate, rhythm, axis, intervals all examined: normal sinus   Imaging:  CXR-- negative CT of head -- negative    Problem List / ED Course / Critical interventions / Medication management  Patient presented to emergency room with primary complaint of altered mental status and confusion.  Patient at baseline is able to ambulate with walker as well as answer questions appropriately.  She is presenting today with confusion, she does not know where she is at.  She is able to follow basic commands.  Family member reports that she has had slowly increasing confusion over  the past day. She is hemodynamically stable and well-appearing.  She is not tachycardic and no fever.  She has no focal neurological deficits on exam.  She does have generalized weakness and is having hard time sitting up in bed. No focal abdominal tenderness. Lung clear to auscultation. No recent injury or fall. Patient appears atraumatic.  Is  to check EKG, imaging and basic labs. Patient CBC is without leukocytosis and lab work primarily at baseline.  She does have significant UTI with leukocytes, white blood cells and bacteria thus we will send for culture.  She has reassuring head CT as well as chest x-ray.  Given change in mental status and urinary tract infection we will seek admission. I ordered medication including rocephin  Reevaluation of the patient after these medicines showed that the patient improved Patients vitals assessed. Upon arrival patient is hemodynamically stable.  I have reviewed the patients home medicines and have made adjustments as needed  Consult: Hospital team for admission.   Plan: Admit for AMS   {Document critical care time when appropriate:1} {Document review of labs and clinical decision tools ie heart score, Chads2Vasc2 etc:1}  {Document your independent review of radiology images, and any outside records:1} {Document your discussion with family members, caretakers, and with consultants:1} {Document social determinants of health affecting pt's care:1} {Document your decision making why or why not admission, treatments were needed:1} Final Clinical Impression(s) / ED Diagnoses Final diagnoses:  Altered mental status, unspecified altered mental status type  Lower urinary tract infectious disease    Rx / DC Orders ED Discharge Orders     None

## 2023-04-07 NOTE — Telephone Encounter (Signed)
 Copied from CRM [id number]. Topic: Clinical - Home Health Verbal Orders >> Apr 06, 2023  3:18 PM Adrianna P wrote: Caller/Agency: [facility] Callback Number: [phone] Service Requested: Physical Therapy Frequency: once a week for 5 weeks Any new concerns about the patient? No   Call returned to home health agency, left detailed message authorizing verbal orders per [facility] standing protocol.

## 2023-04-08 ENCOUNTER — Inpatient Hospital Stay (HOSPITAL_COMMUNITY)

## 2023-04-08 DIAGNOSIS — I611 Nontraumatic intracerebral hemorrhage in hemisphere, cortical: Secondary | ICD-10-CM | POA: Diagnosis not present

## 2023-04-08 DIAGNOSIS — G9389 Other specified disorders of brain: Secondary | ICD-10-CM | POA: Diagnosis not present

## 2023-04-08 DIAGNOSIS — Z96642 Presence of left artificial hip joint: Secondary | ICD-10-CM | POA: Diagnosis present

## 2023-04-08 DIAGNOSIS — E785 Hyperlipidemia, unspecified: Secondary | ICD-10-CM | POA: Diagnosis present

## 2023-04-08 DIAGNOSIS — F0394 Unspecified dementia, unspecified severity, with anxiety: Secondary | ICD-10-CM | POA: Diagnosis present

## 2023-04-08 DIAGNOSIS — E1122 Type 2 diabetes mellitus with diabetic chronic kidney disease: Secondary | ICD-10-CM | POA: Diagnosis present

## 2023-04-08 DIAGNOSIS — Z8249 Family history of ischemic heart disease and other diseases of the circulatory system: Secondary | ICD-10-CM | POA: Diagnosis not present

## 2023-04-08 DIAGNOSIS — F32A Depression, unspecified: Secondary | ICD-10-CM | POA: Diagnosis present

## 2023-04-08 DIAGNOSIS — N184 Chronic kidney disease, stage 4 (severe): Secondary | ICD-10-CM | POA: Diagnosis not present

## 2023-04-08 DIAGNOSIS — E11319 Type 2 diabetes mellitus with unspecified diabetic retinopathy without macular edema: Secondary | ICD-10-CM | POA: Diagnosis present

## 2023-04-08 DIAGNOSIS — I6782 Cerebral ischemia: Secondary | ICD-10-CM | POA: Diagnosis not present

## 2023-04-08 DIAGNOSIS — D631 Anemia in chronic kidney disease: Secondary | ICD-10-CM | POA: Diagnosis present

## 2023-04-08 DIAGNOSIS — Z8673 Personal history of transient ischemic attack (TIA), and cerebral infarction without residual deficits: Secondary | ICD-10-CM | POA: Diagnosis not present

## 2023-04-08 DIAGNOSIS — G928 Other toxic encephalopathy: Secondary | ICD-10-CM | POA: Diagnosis present

## 2023-04-08 DIAGNOSIS — Z515 Encounter for palliative care: Secondary | ICD-10-CM | POA: Diagnosis not present

## 2023-04-08 DIAGNOSIS — Z79899 Other long term (current) drug therapy: Secondary | ICD-10-CM | POA: Diagnosis not present

## 2023-04-08 DIAGNOSIS — I5032 Chronic diastolic (congestive) heart failure: Secondary | ICD-10-CM | POA: Diagnosis present

## 2023-04-08 DIAGNOSIS — Z66 Do not resuscitate: Secondary | ICD-10-CM | POA: Diagnosis present

## 2023-04-08 DIAGNOSIS — K219 Gastro-esophageal reflux disease without esophagitis: Secondary | ICD-10-CM | POA: Diagnosis present

## 2023-04-08 DIAGNOSIS — E1169 Type 2 diabetes mellitus with other specified complication: Secondary | ICD-10-CM | POA: Diagnosis present

## 2023-04-08 DIAGNOSIS — M349 Systemic sclerosis, unspecified: Secondary | ICD-10-CM | POA: Diagnosis present

## 2023-04-08 DIAGNOSIS — Z1152 Encounter for screening for COVID-19: Secondary | ICD-10-CM | POA: Diagnosis not present

## 2023-04-08 DIAGNOSIS — R531 Weakness: Secondary | ICD-10-CM | POA: Diagnosis present

## 2023-04-08 DIAGNOSIS — I132 Hypertensive heart and chronic kidney disease with heart failure and with stage 5 chronic kidney disease, or end stage renal disease: Secondary | ICD-10-CM | POA: Diagnosis present

## 2023-04-08 DIAGNOSIS — R4182 Altered mental status, unspecified: Secondary | ICD-10-CM | POA: Diagnosis not present

## 2023-04-08 DIAGNOSIS — I615 Nontraumatic intracerebral hemorrhage, intraventricular: Secondary | ICD-10-CM | POA: Diagnosis present

## 2023-04-08 DIAGNOSIS — N390 Urinary tract infection, site not specified: Secondary | ICD-10-CM | POA: Diagnosis present

## 2023-04-08 DIAGNOSIS — N185 Chronic kidney disease, stage 5: Secondary | ICD-10-CM | POA: Diagnosis present

## 2023-04-08 DIAGNOSIS — E1165 Type 2 diabetes mellitus with hyperglycemia: Secondary | ICD-10-CM | POA: Diagnosis present

## 2023-04-08 DIAGNOSIS — R410 Disorientation, unspecified: Secondary | ICD-10-CM | POA: Diagnosis not present

## 2023-04-08 LAB — GLUCOSE, CAPILLARY
Glucose-Capillary: 158 mg/dL — ABNORMAL HIGH (ref 70–99)
Glucose-Capillary: 221 mg/dL — ABNORMAL HIGH (ref 70–99)
Glucose-Capillary: 244 mg/dL — ABNORMAL HIGH (ref 70–99)

## 2023-04-08 MED ORDER — ATORVASTATIN CALCIUM 80 MG PO TABS: 80.0000 mg | ORAL_TABLET | Freq: Every day | ORAL | Status: DC

## 2023-04-08 MED ORDER — GLYCOPYRROLATE 1 MG PO TABS: 1.0000 mg | ORAL_TABLET | ORAL | Status: DC | PRN

## 2023-04-08 MED ORDER — FAMOTIDINE 20 MG PO TABS: 40.0000 mg | ORAL_TABLET | Freq: Every day | ORAL | Status: DC

## 2023-04-08 MED ORDER — POLYVINYL ALCOHOL 1.4 % OP SOLN: 1.0000 [drp] | Freq: Four times a day (QID) | OPHTHALMIC | Status: DC | PRN

## 2023-04-08 MED ORDER — HYDRALAZINE HCL 20 MG/ML IJ SOLN
10.0000 mg | Freq: Four times a day (QID) | INTRAMUSCULAR | Status: DC | PRN
  Administered 2023-04-08: 10 mg via INTRAVENOUS
  Filled 2023-04-08: qty 1

## 2023-04-08 MED ORDER — SODIUM CHLORIDE 0.9 % IV SOLN
1.0000 g | INTRAVENOUS | Status: DC
  Administered 2023-04-08: 1 g via INTRAVENOUS
  Filled 2023-04-08: qty 10

## 2023-04-08 MED ORDER — CLOPIDOGREL BISULFATE 75 MG PO TABS: 75.0000 mg | ORAL_TABLET | Freq: Every day | ORAL | Status: DC

## 2023-04-08 MED ORDER — SODIUM CHLORIDE 0.9 % IV SOLN: 1.0000 g | Freq: Once | INTRAVENOUS | Status: DC

## 2023-04-08 MED ORDER — LORAZEPAM 2 MG/ML IJ SOLN
1.0000 mg | INTRAMUSCULAR | Status: DC | PRN
  Administered 2023-04-10: 1 mg via INTRAVENOUS
  Filled 2023-04-08: qty 1

## 2023-04-08 MED ORDER — HYDRALAZINE HCL 25 MG PO TABS: 25.0000 mg | ORAL_TABLET | Freq: Three times a day (TID) | ORAL | Status: DC

## 2023-04-08 MED ORDER — GLYCOPYRROLATE 0.2 MG/ML IJ SOLN
0.2000 mg | INTRAMUSCULAR | Status: DC | PRN
  Administered 2023-04-11: 0.2 mg via INTRAVENOUS
  Filled 2023-04-08 (×2): qty 1

## 2023-04-08 MED ORDER — MORPHINE SULFATE (PF) 2 MG/ML IV SOLN
1.0000 mg | INTRAVENOUS | Status: DC | PRN
  Administered 2023-04-10 – 2023-04-11 (×4): 2 mg via INTRAVENOUS
  Administered 2023-04-11: 1 mg via INTRAVENOUS
  Filled 2023-04-08 (×3): qty 1
  Filled 2023-04-08: qty 2
  Filled 2023-04-08 (×3): qty 1

## 2023-04-08 MED ORDER — LACTATED RINGERS IV SOLN: INTRAVENOUS | Status: DC

## 2023-04-08 MED ORDER — LORAZEPAM 1 MG PO TABS: 1.0000 mg | ORAL_TABLET | ORAL | Status: DC | PRN

## 2023-04-08 MED ORDER — SODIUM CHLORIDE 0.9 % IV SOLN: INTRAVENOUS | Status: DC

## 2023-04-08 MED ORDER — CLONAZEPAM 0.5 MG PO TABS: 0.5000 mg | ORAL_TABLET | Freq: Two times a day (BID) | ORAL | Status: DC | PRN

## 2023-04-08 MED ORDER — METOPROLOL SUCCINATE ER 25 MG PO TB24: 25.0000 mg | ORAL_TABLET | Freq: Every day | ORAL | Status: DC

## 2023-04-08 MED ORDER — GLYCOPYRROLATE 0.2 MG/ML IJ SOLN: 0.2000 mg | INTRAMUSCULAR | Status: DC | PRN

## 2023-04-08 MED ORDER — RISPERIDONE 0.5 MG PO TBDP: 0.5000 mg | ORAL_TABLET | Freq: Every day | ORAL | Status: DC

## 2023-04-08 MED ORDER — LORAZEPAM 2 MG/ML PO CONC: 1.0000 mg | ORAL | Status: DC | PRN

## 2023-04-08 NOTE — Progress Notes (Signed)
 MEWS Progress Note  Patient Details Name: Sandra [name] MRN: [id number] DOB: January 10, 1934 Today's Date: 04/08/2023   MEWS Flowsheet Documentation:  Assess: MEWS Score Temp: 98.2 F (36.8 C) BP: (!) 201/67 MAP (mmHg): 102 Pulse Rate: 80 ECG Heart Rate: 87 Resp: 20 Level of Consciousness: Unresponsive SpO2: 100 % O2 Device: Room Air Assess: MEWS Score MEWS Temp: 0 MEWS Systolic: 2 MEWS Pulse: 0 MEWS RR: 0 MEWS LOC: 3 MEWS Score: 5 MEWS Score Color: Red Assess: SIRS CRITERIA SIRS Temperature : 0 SIRS Respirations : 0 SIRS Pulse: 0 SIRS WBC: 0 SIRS Score Sum : 0 SIRS Temperature : 0 SIRS Pulse: 0 SIRS Respirations : 0 SIRS WBC: 0 SIRS Score Sum : 0 Assess: if the MEWS score is Yellow or Red Were vital signs accurate and taken at a resting state?: Yes Does the patient meet 2 or more of the SIRS criteria?: No MEWS guidelines implemented : Yes, red Treat MEWS Interventions: Considered administering scheduled or prn medications/treatments as ordered Take Vital Signs Increase Vital Sign Frequency : Red: Q1hr x2, continue Q4hrs until patient remains green for 12hrs Escalate MEWS: Escalate: Red: Discuss with charge nurse and notify provider. Consider notifying RRT. If remains red for 2 hours consider need for higher level of care Notify: Charge Nurse/RN Name of Charge Nurse/RN Notified: Audiological scientist Name/Title: Dr. Dahal/ Attending Date Provider Notified: 04/08/23 Time Provider Notified: 1045 Method of Notification:  (Secure Chat) Notification Reason: Change in status Provider response: Evaluate remotely, See new orders Date of Provider Response: 04/08/23 Time of Provider Response: 1046  BP elevated, PRN received fro IV Hydralazine.  Ardeen Fillers W 04/08/2023, 10:56 AM

## 2023-04-08 NOTE — Progress Notes (Signed)
 Patient was seen and examined again this afternoon. Her grandson/POA Mr. Valentina Shaggy was at bedside.  Patient's daughter [name] is his aunt.  She was on the phone. Patient remains deeply somnolent.  Vital signs stable.  Blood sugar level not low. MRI brain done.  Result pending.  I have called radiology to expedite.  I had a long conversation with POA at bedside. He is very aware of patient's frailty and high risk of death.  He confirms that patient had expressed to him in the past on multiple occasions wanting NOT to be resuscitated.  CODE STATUS in place as DNR/DNI. He understands that his aunt [name] is very emotional and is having difficulty accepting patient's critical condition.  He is afraid that she could try to revoke CODE STATUS but he absolutely does not want that to happen.  He states if patient suffers cardiac or respiratory arrest, absolutely not to resuscitate, and call him right away. I will update him when the MRI brain result is available.

## 2023-04-08 NOTE — Progress Notes (Signed)
 PT Cancellation Note  Patient Details Name: Sandra [name] MRN: [id number] DOB: 02-Jan-1934   Cancelled Treatment:    Reason Eval/Treat Not Completed: Fatigue/lethargy limiting ability to participate. Discussed pt case with RN who reports pt not appropriate for PT evaluation at this time, as pt not following commands and has been very lethargic all day. Will continue to follow and initiate PT eval when pt is more alert.    Marylynn Pearson 04/08/2023, 2:28 PM  Conni Slipper, PT, DPT Acute Rehabilitation Services Secure Chat Preferred Office: [phone]

## 2023-04-08 NOTE — Plan of Care (Signed)
 Comfort Measure only.  Family updated by Dr. Pola Corn.  Remained unresponsive for the duration of the day. Did not open eyes, did not respond to painful stimuli.   Problem: Education: Goal: Ability to describe self-care measures that may prevent or decrease complications (Diabetes Survival Skills Education) will improve Outcome: Progressing Goal: Individualized Educational Video(s) Outcome: Progressing   Problem: Coping: Goal: Ability to adjust to condition or change in health will improve Outcome: Progressing   Problem: Fluid Volume: Goal: Ability to maintain a balanced intake and output will improve Outcome: Progressing   Problem: Health Behavior/Discharge Planning: Goal: Ability to identify and utilize available resources and services will improve Outcome: Progressing

## 2023-04-08 NOTE — Progress Notes (Addendum)
 Patient is not verbally  responsive did not do admission questions.  Family bedside and supportive

## 2023-04-08 NOTE — Progress Notes (Signed)
 PROGRESS NOTE  Sandra [name]  DOB: April 03, 1933  PCP: [name], NP WJX:[id number]  DOA: 04/07/2023  LOS: 0 days  Hospital Day: 2  Brief narrative: [name] is a 89 y.o. female with PMH significant for DM2, HTN, HLD, CVA, h/o multiple brain bleeds, cognitive impairment, retinopathy, CKD, Takotsubo cardiomyopathy. Patient was brought to the ED on 3/19 with complaint of altered mental status.  Chart reviewed. 1/22-1/30, patient was hospitalized for left hip fracture, surgically repaired and discharged to SNF where she stayed about 2 weeks and discharged home.  Lives at home with her daughter [name]. 2/21, seen in the ED for URI, tested negative for flu, COVID.  Discharged to continue supportive care. It seems patient's mental status has been gradually declining since then. 3/6, patient was brought to the ED again for altered mental status.  Urinalysis suggested fungal UTI.  Patient was given 1 dose of fosfomycin and discharged on 2 weeks course of fluconazole. 3/14 to 3/16, hospitalized at [facility] for worsening mental status.  Imagings unremarkable.  Klonopin and hydroxyzine were stopped.  Risperidone was added at discharge.  Patient's daughter believes that Risperdal is making her more sleepy.  Her mental rest continue to worsen and is brought to ED on 3/19.  Per initial ED note, patient was nonverbal but following all commands..  Later she was able to answer most questions.  Patient ambulated with a walker with minimal assistance.  No new focal deficit was observed. CT head unremarkable. Labs with WC count normal at 6.6, hemoglobin 8.8, BUN/creatinine 31/2 Urinalysis with hazy yellow urine, moderate leukocytes and rare bacteria  Patient was suspected to have a UTI again and was empirically started on IV Rocephin Admitted to [facility]  Subjective: Patient was seen and examined this morning. At the time of my evaluation, patient was deeply somnolent.  Did not open eyes on sternal  rub.  She is breathing.  Other vital signs are stable.  Blood glucose level is in range but complaining unresponsive. Patient's daughter was not at bedside so I called her.  History reviewed and detailed as above.  Assessment and plan: Acute metabolic encephalopathy Underlying dementia Per my conversation with patient's daughter and chart review, it seems patient's mental status has been gradually declining at least since the hip fracture in February.  Altered mental status was the reason of multiple ER visits and the most recent hospitalization. When discharged on 3/16, Klonopin and hydroxyzine were stopped and.  I will was started.  Daughter believes that mental status is worsening since she took Risperdal. Per ED notes, patient improved in the ED yesterday.  She was able to have some conversation and also able to ambulate with minimal assistance. However on my eval of this morning, patient is completely somnolent and unable to wake up even on sternal rub.  Pupillary reflex present.  Maintaining saturation without supplemental oxygen.  Last glucose check was normal. I called and updated patient's daughter.  She wanted to get an MRI brain given her history of multiple brain bleeds.  Initial CT scan of head yesterday was unremarkable. I am not sure if the degree of pyuria and potential she has is enough to cause this severe degree of encephalopathy.  Okay to continue empiric IV Rocephin for now. Most likely, her altered mental status is due to Risperdal.  If so, I expect her mental status to improve in next few hours.  Will stop any mood altering medication.  Continue IV hydration.  Presumptive UTI, POA UA  positive for pyuria Started on Rocephin empirically in the ER, continue.   CKD5 Creatinine at baseline.   Recent Labs    02/13/23 0549 02/15/23 0509 02/16/23 0542 02/17/23 0636 03/12/23 1441 03/25/23 1249 04/01/23 2120 04/03/23 0705 04/04/23 0636 04/07/23 1521 04/07/23 2017  BUN 35*  40* 41* 40* 37* 39* 41* 36* 33* 31*  --   CREATININE 2.50* 2.61* 2.47* 2.53* 2.02* 2.02* 2.44* 1.96* 1.95* 2.00* 1.98*   Type 2 diabetes mellitus A1c 8.2 on November 2024 PTA meds-Premeal sliding scale Currently on SSI/Accu-Cheks Recent Labs  Lab 04/03/23 2103 04/04/23 0648 04/04/23 1125 04/07/23 2118 04/08/23 0700  GLUCAP 127* 185* 113* 136* 158*   Hypertension PTA meds- metoprolol 25 mg daily, hydralazine 25 mg 3 times daily Resume both when able to take oral. IV hydralazine as needed for now.  H/o CVA HLD Continue Plavix and Lipitor  GERD Pepcid  Physical debility PT OT eval when mental status improves Fall precautions  Goals of care   Code Status: Limited: Do not attempt resuscitation (DNR) -DNR-LIMITED -Do Not Intubate/DNI     DVT prophylaxis:  enoxaparin (LOVENOX) injection 30 mg Start: 04/07/23 2100   Antimicrobials: Rocephin Fluid: NS at 75 mL/h Consultants: None Family Communication: Spoke with patient's daughter on the phone this morning  Status: Observation Level of care:  Telemetry Medical   Patient is from: Home Needs to continue in-hospital care: Has significant encephalopathy. Anticipated d/c to: Pending clinical course    Diet:  Diet Order             Diet heart healthy/carb modified Fluid consistency: Thin  Diet effective now                   Scheduled Meds:  atorvastatin  80 mg Oral Daily   clopidogrel  75 mg Oral Daily   enoxaparin (LOVENOX) injection  30 mg Subcutaneous Q24H   famotidine  40 mg Oral Daily   hydrALAZINE  25 mg Oral TID   insulin aspart  0-5 Units Subcutaneous QHS   insulin aspart  0-9 Units Subcutaneous TID WC   metoprolol succinate  25 mg Oral Daily    PRN meds: acetaminophen, melatonin, polyethylene glycol, polyvinyl alcohol, prochlorperazine   Infusions:   sodium chloride     cefTRIAXone (ROCEPHIN)  IV      Antimicrobials: Anti-infectives (From admission, onward)    Start     Dose/Rate  Route Frequency Ordered Stop   04/08/23 1800  cefTRIAXone (ROCEPHIN) 1 g in sodium chloride 0.9 % 100 mL IVPB  Status:  Discontinued        1 g 200 mL/hr over 30 Minutes Intravenous  Once 04/08/23 0423 04/08/23 0454   04/08/23 1800  cefTRIAXone (ROCEPHIN) 1 g in sodium chloride 0.9 % 100 mL IVPB        1 g 200 mL/hr over 30 Minutes Intravenous Every 24 hours 04/08/23 0454     04/07/23 1815  cefTRIAXone (ROCEPHIN) 1 g in sodium chloride 0.9 % 100 mL IVPB        1 g 200 mL/hr over 30 Minutes Intravenous  Once 04/07/23 1806 04/07/23 1900       Objective: Vitals:   04/08/23 0840 04/08/23 0841  BP:  (!) 186/71  Pulse: 85 86  Resp:  18  Temp:    SpO2: 99% 99%    Intake/Output Summary (Last 24 hours) at 04/08/2023 1043 Last data filed at 04/08/2023 0653 Gross per 24 hour  Intake --  Output 0  ml  Net 0 ml   Filed Weights   04/07/23 1424  Weight: 42.2 kg   Weight change:  Body mass index is 18.17 kg/m.   Physical Exam: General exam: Elderly Caucasian female.  Not in pain Skin: No rashes, lesions or ulcers. HEENT: Atraumatic, normocephalic, no obvious bleeding Lungs: Clear to auscultation bilaterally,  CVS: S1, S2, no murmur,   GI/Abd: Soft, nontender, nondistended, bowel sound present,   CNS: Deeply somnolent.  Does not respond to sternal rub.  Pupillary reflex present Psychiatry: Unable to examine Extremities: No pedal edema, no calf tenderness,   Data Review: I have personally reviewed the laboratory data and studies available.  F/u labs  Unresulted Labs (From admission, onward)     Start     Ordered   04/14/23 0500  Creatinine, serum  (enoxaparin (LOVENOX)    CrCl < 30 ml/min)  Once,   R       Comments: while on enoxaparin therapy.    04/07/23 2016   04/08/23 0419  Urinalysis, w/ Reflex to Culture (Infection Suspected) -Urine, Clean Catch  (Urine Labs)  Once,   R       Question:  Specimen Source  Answer:  Urine, Clean Catch   04/08/23 0418   04/07/23 1723  Urine  Culture  Add-on,   AD       Question:  Indication  Answer:  Altered mental status (if no other cause identified)   04/07/23 1722            Total time spent in review of labs and imaging, patient evaluation, formulation of plan, documentation and communication with family: 65 minutes  Signed, Lorin Glass, MD [facility] 04/08/2023

## 2023-04-08 NOTE — Significant Event (Signed)
 Radiologist called with MRI result. She has a large intracranial hemorrhage on the right side with intraventricular extension and mass effect. I met with patient's POA/grandson Sandra [name] at bedside and shared the result.  His cousin states he was also at bedside.  Her mom [name] was not present.  I offered a [name] to call her.  But he is worried that she will not be able to emotionally handle it.  He wants to go and meet her in person to break the news. I confirmed DNR/DNI status again.  Family understands her poor prognosis and did not request any aggressive intervention.  As discussed with family, I updated her to comfort care status.  Order set in. Nonessential medications stopped including IV antibiotics, IV fluid.. Discussed with bedside RN.

## 2023-04-08 NOTE — TOC CM/SW Note (Signed)
 Transition of Care [facility]) - Inpatient Brief Assessment   Patient Details  Name: Sandra [name] MRN: [id number] Date of Birth: [birth year]  Transition of Care [facility]) CM/SW Contact:    Tom-Johnson, Hershal Coria, RN Phone Number: 04/08/2023, 4:25 PM   Clinical Narrative:  Patient presented to the ED with Altered Mental Status.  UA positive for Pyuria, started on IV abx.  Patient currently not responsive for assessment.   CM will continue to follow.       Transition of Care Asessment:

## 2023-04-08 NOTE — Progress Notes (Signed)
 Sandra [name] is POA

## 2023-04-08 NOTE — Progress Notes (Signed)
 Sandra [name] is not responsive to sternal rub. Not interactive with staff.  She does not open her eyes or follow commands.  AM medications held at this time due to inability/ lack of responsiveness to swallow medications.  Dr. Pola Corn made aware that all oral medications held due to safety/aspiration concern.

## 2023-04-08 NOTE — Progress Notes (Signed)
 New Admission Note:   Arrival Method: stretcher Mental Orientation: confused Telemetry: yes NSR Assessment: Completed Skin: see flow sheet IV: NSL Pain: none Tubes: none Safety Measures: Safety Fall Prevention Plan has been discussed Admission: Completed [facility] Orientation: Patient has been orientated to the room, unit and staff.  Family: none  Orders have been reviewed and implemented. Will continue to monitor the patient. Call light has been placed within reach and bed alarm has been activated.   Artemio Aly BSN, RN Phone number: [phone]

## 2023-04-09 DIAGNOSIS — R4182 Altered mental status, unspecified: Secondary | ICD-10-CM | POA: Diagnosis not present

## 2023-04-09 LAB — URINE CULTURE

## 2023-04-09 NOTE — Progress Notes (Signed)
 PROGRESS NOTE  Junie Avilla  DOB: 1933/02/10  PCP: [name], NP NFA:[id number]  DOA: 04/07/2023  LOS: 1 day  Hospital Day: 3  Brief narrative: Sandra [name] is a 89 y.o. female with PMH significant for DM2, HTN, HLD, CVA, h/o multiple brain bleeds, cognitive impairment, retinopathy, CKD, Takotsubo cardiomyopathy. Patient was brought to the ED on 3/19 with complaint of altered mental status.  Initial workup showed unremarkable CT head. She was admitted to [facility] with the diagnosis of UTI leading to altered mental status. Mental status worsened. MRI obtained on 3/20 showed large intracranial hemorrhage with intraventricular extension. Discussed with family.  Comfort care measures were chosen. Hospice consultation requested.  Subjective: Patient was seen and examined this morning. Deeply somnolent.  Does not respond to sternal rub.  Breathing on room air. Son and daughter-in-law at bedside.  Assessment and plan: Comfort care status admitted to [facility] with the diagnosis of UTI leading to altered mental status. Mental status worsened. MRI obtained on 3/20 showed large intracranial hemorrhage with intraventricular extension. Discussed with family.   Family decided to choose comfort care measures.  Antibiotics, IV fluids stopped. Unsure if she will have in-hospital mortality.  Hospice consultation requested as well. Comfort care order set to be used  Other diagnosis currently not being addressed Acute metabolic encephalopathy Underlying dementia Presumptive UTI, POA CKD5 Type 2 diabetes mellitus Hypertension H/o CVA HLD GERD Physical debility  Goals of care   Code Status: Do not attempt resuscitation (DNR) - Comfort care    Family Communication: Spoke with patient's son and daughter-in-law at bedside this morning  Status: Inpatient Level of care:  Telemetry Medical   Patient is from: Home Needs to continue in-hospital care: Has significant  encephalopathy. Anticipated d/c to: Residential hospice    Diet:  Diet Order             Diet heart healthy/carb modified Fluid consistency: Thin  Diet effective now                   Scheduled Meds:    PRN meds: acetaminophen, glycopyrrolate **OR** glycopyrrolate **OR** glycopyrrolate, hydrALAZINE, LORazepam **OR** LORazepam **OR** LORazepam, melatonin, morphine injection   Infusions:     Antimicrobials: Anti-infectives (From admission, onward)    Start     Dose/Rate Route Frequency Ordered Stop   04/08/23 1800  cefTRIAXone (ROCEPHIN) 1 g in sodium chloride 0.9 % 100 mL IVPB  Status:  Discontinued        1 g 200 mL/hr over 30 Minutes Intravenous  Once 04/08/23 0423 04/08/23 0454   04/08/23 1800  cefTRIAXone (ROCEPHIN) 1 g in sodium chloride 0.9 % 100 mL IVPB  Status:  Discontinued        1 g 200 mL/hr over 30 Minutes Intravenous Every 24 hours 04/08/23 0454 04/08/23 1809   04/07/23 1815  cefTRIAXone (ROCEPHIN) 1 g in sodium chloride 0.9 % 100 mL IVPB        1 g 200 mL/hr over 30 Minutes Intravenous  Once 04/07/23 1806 04/07/23 1900       Objective: Vitals:   04/08/23 1608 04/08/23 2025  BP: (!) 187/62 (!) 172/64  Pulse: 98 (!) 110  Resp: (!) 22 (!) 22  Temp: (!) 97.5 F (36.4 C) 98.4 F (36.9 C)  SpO2: 100% 100%    Intake/Output Summary (Last 24 hours) at 04/09/2023 1504 Last data filed at 04/08/2023 2000 Gross per 24 hour  Intake 538.78 ml  Output --  Net 538.78 ml  Filed Weights   04/07/23 1424  Weight: 42.2 kg   Weight change:  Body mass index is 18.17 kg/m.   Physical Exam: General exam: Elderly Caucasian female.  Not in pain.  Comfortable I did not examine other systems because of comfort care status  Data Review: I have personally reviewed the laboratory data and studies available.  F/u labs  Unresulted Labs (From admission, onward)     Start     Ordered   04/08/23 0419  Urinalysis, w/ Reflex to Culture (Infection Suspected)  -Urine, Clean Catch  (Urine Labs)  Once,   R       Question:  Specimen Source  Answer:  Urine, Clean Catch   04/08/23 0418            Total time spent in review of labs and imaging, patient evaluation, formulation of plan, documentation and communication with family: 25 minutes  Signed, Lorin Glass, MD [facility] 04/09/2023

## 2023-04-10 DIAGNOSIS — R4182 Altered mental status, unspecified: Secondary | ICD-10-CM | POA: Diagnosis not present

## 2023-04-10 NOTE — Progress Notes (Signed)
 PROGRESS NOTE  Sandra [name]  DOB: 11/23/1933  PCP: [name], NP UJW:[id number]  DOA: 04/07/2023  LOS: 2 days  Hospital Day: 4  Brief narrative: Sandra [name] is a 89 y.o. female with PMH significant for DM2, HTN, HLD, CVA, h/o multiple brain bleeds, cognitive impairment, retinopathy, CKD, Takotsubo cardiomyopathy. Patient was brought to the ED on 3/19 with complaint of altered mental status.  Initial workup showed unremarkable CT head. She was admitted to [facility] with the diagnosis of UTI leading to altered mental status. Mental status worsened. MRI obtained on 3/20 showed large intracranial hemorrhage with intraventricular extension. Discussed with family.  Comfort care measures were chosen. Hospice consultation requested.  Subjective: Patient was seen and examined this morning. Remains somnolent.  Unable to respond to sternal rub.  Slightly tachypneic today. Family was not at bedside at the time of my evaluation Hospice evaluation requested.  Assessment and plan: Comfort care status admitted to [facility] with the diagnosis of UTI leading to altered mental status. Mental status worsened. MRI obtained on 3/20 showed large intracranial hemorrhage with intraventricular extension. Discussed with family.   Family decided to choose comfort care measures.  Antibiotics, IV fluids stopped. Unsure if she will have in-hospital mortality.  Comfort care order set to be used Hospice evaluation obtained.  Patient seems to getting more tachypneic.  High risk of in-hospital death.  Will monitor 1 more day in the hospital  Other diagnosis currently not being addressed Acute metabolic encephalopathy Underlying dementia Presumptive UTI, POA CKD5 Type 2 diabetes mellitus Hypertension H/o CVA HLD GERD Physical debility  Goals of care   Code Status: Do not attempt resuscitation (DNR) - Comfort care    Family Communication: Family not at bedside today  Status: Inpatient Level of  care:  Telemetry Medical   Patient is from: Home Needs to continue in-hospital care: Has significant encephalopathy. Anticipated d/c to: Residential hospice    Diet:  Diet Order             Diet heart healthy/carb modified Fluid consistency: Thin  Diet effective now                   Scheduled Meds:    PRN meds: acetaminophen, glycopyrrolate **OR** glycopyrrolate **OR** glycopyrrolate, hydrALAZINE, LORazepam **OR** LORazepam **OR** LORazepam, melatonin, morphine injection   Infusions:     Antimicrobials: Anti-infectives (From admission, onward)    Start     Dose/Rate Route Frequency Ordered Stop   04/08/23 1800  cefTRIAXone (ROCEPHIN) 1 g in sodium chloride 0.9 % 100 mL IVPB  Status:  Discontinued        1 g 200 mL/hr over 30 Minutes Intravenous  Once 04/08/23 0423 04/08/23 0454   04/08/23 1800  cefTRIAXone (ROCEPHIN) 1 g in sodium chloride 0.9 % 100 mL IVPB  Status:  Discontinued        1 g 200 mL/hr over 30 Minutes Intravenous Every 24 hours 04/08/23 0454 04/08/23 1809   04/07/23 1815  cefTRIAXone (ROCEPHIN) 1 g in sodium chloride 0.9 % 100 mL IVPB        1 g 200 mL/hr over 30 Minutes Intravenous  Once 04/07/23 1806 04/07/23 1900       Objective: Vitals:   04/09/23 1520 04/10/23 0925  BP: (!) 172/65 (!) 175/58  Pulse: (!) 104 100  Resp: 18 17  Temp: 98.3 F (36.8 C) 99 F (37.2 C)  SpO2: 100% 93%    Intake/Output Summary (Last 24 hours) at 04/10/2023 1444 Last data filed  at 04/10/2023 1300 Gross per 24 hour  Intake 0 ml  Output 400 ml  Net -400 ml   Filed Weights   04/07/23 1424  Weight: 42.2 kg   Weight change:  Body mass index is 18.17 kg/m.   Physical Exam: General exam: Elderly Caucasian female.  Tachypneic, not in pain.  Comfortable I did not examine other systems because of comfort care status  Data Review: I have personally reviewed the laboratory data and studies available.  F/u labs  Unresulted Labs (From admission, onward)      Start     Ordered   04/08/23 0419  Urinalysis, w/ Reflex to Culture (Infection Suspected) -Urine, Clean Catch  (Urine Labs)  Once,   R       Question:  Specimen Source  Answer:  Urine, Clean Catch   04/08/23 0418            Total time spent in review of labs and imaging, patient evaluation, formulation of plan, documentation and communication with family: 25 minutes  Signed, Lorin Glass, MD [facility] 04/10/2023

## 2023-04-10 NOTE — Progress Notes (Signed)
 [facility] [phone]  [facility] Liaison Note  Referral received from [facility] for family interest in [facility].   Met with patient and family in room to explain services and hospice philosophy and all questions answered.   Discussed concerns of stability of patient during transport due to patient currently presenting with increased respirations and having loud open mouth breathing that was new as of today. Provided family with options on continuing to pursue [facility] admission, however family chose to remain in hospital. Likely hospital death.     Updated attending and [facility] manager that after assessing patient, she has had changes in breathing and does not appear to be stable for transport. Will continue to follow.  Thank you for the opportunity to participate in this patient's care  Roe Rutherford, BSN, RN  Hospice Nurse Liaison  [phone]

## 2023-04-10 NOTE — TOC Progression Note (Signed)
 Transition of Care [facility]) - Progression Note    Patient Details  Name: Sandra [name] MRN: [id number] Date of Birth: 11/14/1933  Transition of [facility]) CM/SW Contact  Dellie Burns [place], [place] Phone Number: 04/10/2023, 10:41 AM  Clinical Narrative:   Sherron Monday to pt's grandson/HCPOA Valentina Shaggy to address consult for hospice home placement. Discussed placement process and offered hospice provider choice. HCPOA requesting [facility]. Referral made to [facility] with [facility]. SW will follow.   Dellie Burns, MSW, LCSW [phone] (coverage)           Expected Discharge Plan and Services                                               Social Determinants of Health (SDOH) Interventions SDOH Screenings   Food Insecurity: Unknown (04/03/2023)  Housing: Unknown (04/03/2023)  Transportation Needs: Unknown (04/03/2023)  Utilities: Patient Unable To Answer (04/03/2023)  Depression (PHQ2-9): Low Risk  (03/05/2023)  Social Connections: Patient Unable To Answer (04/03/2023)  Recent Concern: Social Connections - Socially Isolated (02/11/2023)  Tobacco Use: Medium Risk (04/07/2023)    Readmission Risk Interventions    04/02/2023    3:54 PM 12/21/2022   12:26 PM  Readmission Risk Prevention Plan  Transportation Screening Complete Complete  PCP or Specialist Appt within 3-5 Days  Complete  HRI or Home Care Consult  Complete  Social Work Consult for Recovery Care Planning/Counseling  Complete  Palliative Care Screening  Not Applicable  Medication Review [employer]) Complete Complete  PCP or Specialist appointment within 3-5 days of discharge Complete   HRI or Home Care Consult Complete   SW Recovery Care/Counseling Consult Complete   Palliative Care Screening Not Applicable   Skilled Nursing Facility Complete

## 2023-04-11 DIAGNOSIS — I629 Nontraumatic intracranial hemorrhage, unspecified: Secondary | ICD-10-CM

## 2023-04-13 ENCOUNTER — Telehealth: Payer: Self-pay | Admitting: Neurology

## 2023-04-13 NOTE — Telephone Encounter (Signed)
 FYI-Pt's daughter called wanting to inform the provider that her mother has passed away and daughter wanted provider to know that the pt thought the world of him and daughter appreciates everything that everyone has done for her mother.

## 2023-04-19 ENCOUNTER — Other Ambulatory Visit: Payer: Self-pay | Admitting: Family

## 2023-04-19 ENCOUNTER — Ambulatory Visit: Payer: Medicare HMO | Admitting: Family

## 2023-04-20 NOTE — Death Summary Note (Addendum)
 DEATH SUMMARY   Patient Details  Name: Sandra [name] MRN: [id number] DOB: 12-05-1933 WNU:[id number], Donalee Citrin, NP Admission/Discharge Information   Admit Date:  April 24, 2023  Date of Death: Date of Death: 28-Apr-2023  Time of Death: Time of Death: 0846  Length of Stay: 3   Principle Cause of death: Intra cranial hemorrhage  Hospital Diagnoses: Principal Problem:   AMS (altered mental status)  Hospital Course: Sandra [name] is a 89 y.o. female with PMH significant for DM2, HTN, HLD, CVA, h/o multiple brain bleeds, cognitive impairment, retinopathy, CKD, Takotsubo cardiomyopathy. Patient was brought to the ED on 2023-04-24 with complaint of altered mental status.   Initial workup showed unremarkable CT head. She was admitted to [facility] with the diagnosis of UTI leading to altered mental status. Mental status worsened. MRI obtained on 3/20 showed large intracranial hemorrhage with intraventricular extension. Discussed with family.  Family understood her advanced age, frail health which made her not a candidate for any surgical intervention.   Comfort care measures were chosen.  Comfort care order set was utilized. Family visited and stayed with her for most of the time. 2023-04-28, at the presence of her daughter, patient expired at 8:46 am. Daughter at bedside was thankful that she was present at that time and patient was not at any discomfort.   Consultations: Palliative care  The results of significant diagnostics from this hospitalization (including imaging, microbiology, ancillary and laboratory) are listed below for reference.   Significant Diagnostic Studies: MR BRAIN WO CONTRAST Result Date: 04/08/2023 CLINICAL DATA:  Provided history: Delirium. EXAM: MRI HEAD WITHOUT CONTRAST TECHNIQUE: Multiplanar, multiecho pulse sequences of the brain and surrounding structures were obtained without intravenous contrast. COMPARISON:  Head CT 04-24-23.  MRI brain and MRA head 12/28/2022. FINDINGS:  Brain: Mild generalized parenchymal atrophy. Very large acute parenchymal hemorrhage within the right cerebral hemisphere, centered within the right frontal lobe. This hematoma measures 7.9 x 4.3 x 6.9 cm (117 mL). Prominent surrounding edema. Mass effect with 15 mm leftward midline shift and effacement of the basal cisterns on the right. Partial effacement of the right lateral ventricle. Effacement of the third ventricle. Prominence of the right lateral ventricle temporal horn and left lateral ventricle consistent with ventricular entrapment. T2 FLAIR hyperintense signal abnormality along portions of the left lateral ventricle has progressed. While these signal changes at least partially reflect chronic small vessel ischemic disease, a component of transependymal interstitial edema may be present. Extension of hemorrhage into the lateral, third and fourth ventricles (at least moderate in volume). Patchy foci of restricted diffusion within the medial right temporal lobe/hippocampus, nonspecific but possibly reflecting acute infarcts or (if there has been seizure activity) seizure-related signal changes). Restricted diffusion Chronic encephalomalacia/gliosis (and chronic hemosiderin deposition) again demonstrated at the right temporo-occipital junction, at site of prior hemorrhage. Chronic hemosiderin staining also again demonstrated along the left temporal and left occipital lobes. Few nonspecific punctate chronic microhemorrhages scattered elsewhere within the brain. Advanced multifocal T2 FLAIR hyperintense signal abnormality elsewhere within the cerebral white matter, nonspecific but compatible with chronic small vessel ischemic disease. To a lesser degree, chronic small vessel ischemic changes are also present within the pons. Vascular: Maintained flow voids within the proximal large arterial vessels. Skull and upper cervical spine: No focal worrisome marrow lesion. Incompletely assessed cervical spondylosis.  Ligamentous hypertrophy about the dens. Sinuses/Orbits: No mass or acute finding within the imaged orbits. Prior bilateral ocular lens replacement. Mild mucosal thickening within the bilateral maxillary, bilateral sphenoid and bilateral ethmoid sinuses. Impression  4 will be called to the ordering clinician or representative by the Radiologist Assistant, and communication documented in the PACS or [employer]. Other emergent findings called by telephone at the time of interpretation on 04/08/2023 at 5:40 pm to provider [facility] , who verbally acknowledged these results. IMPRESSION: 1. Very large acute parenchymal hemorrhage centered within the right frontal lobe. The hematoma measures 7.9 x 4.3 x 6.9 cm (117 mL). Prominent surrounding edema. Mass effect with 15 mm leftward midline shift and effacement of the basal cisterns on the right. Partial effacement of the right lateral ventricle and effacement of the third ventricle. Prominence of the right lateral ventricle temporal horn and of the left lateral ventricle, consistent with ventricular entrapment (with possible transependymal interstitial edema). 2. Extension of hemorrhage into the lateral, third and fourth ventricles (at least moderate in volume). 3. Emergent neurosurgical consultation recommended if aligned with the goals of care. 4. Patchy foci of restricted diffusion within the medial right temporal lobe/hippocampus, nonspecific but possibly reflecting acute infarcts or (if there has been seizure activity) seizure-related signal changes. 5. Foci of chronic hemorrhage elsewhere within/along the supratentorial brain as described. Although nonspecific, cerebral amyloid angiopathy is a consideration. 6. Background parenchymal atrophy and advanced chronic small vessel ischemic disease. Electronically Signed   By: Jackey Loge D.O.   On: 04/08/2023 18:17   CT Head Wo Contrast Result Date: 04/07/2023 CLINICAL DATA:  Altered level of consciousness EXAM:  CT HEAD WITHOUT CONTRAST TECHNIQUE: Contiguous axial images were obtained from the base of the skull through the vertex without intravenous contrast. RADIATION DOSE REDUCTION: This exam was performed according to the departmental dose-optimization program which includes automated exposure control, adjustment of the mA and/or kV according to patient size and/or use of iterative reconstruction technique. COMPARISON:  04/04/2023 FINDINGS: Brain: Stable chronic small-vessel ischemic changes throughout the periventricular white matter and encephalomalacia from chronic right occipital cortical infarct. The lateral ventricles and midline structures are stable. No acute extra-axial fluid collections. No mass effect. Vascular: No hyperdense vessel or unexpected calcification. Skull: Normal. Negative for fracture or focal lesion. Sinuses/Orbits: Mild mucoperiosteal thickening within the ethmoid and maxillary sinuses. Other: None. IMPRESSION: 1. Stable head CT, no acute process. Electronically Signed   By: Sharlet Salina M.D.   On: 04/07/2023 17:50   DG Chest 2 View Result Date: 04/07/2023 CLINICAL DATA:  Altered level of consciousness EXAM: CHEST - 2 VIEW COMPARISON:  04/01/2023 FINDINGS: Frontal and lateral views of the chest demonstrate a stable cardiac silhouette. Chronic elevation of the right hemidiaphragm. No acute airspace disease, effusion, or pneumothorax. No acute bony abnormalities. IMPRESSION: 1. Stable chest, no acute process. Electronically Signed   By: Sharlet Salina M.D.   On: 04/07/2023 16:18   CT HEAD WO CONTRAST ( ) Result Date: 04/04/2023 CLINICAL DATA:  Unwitnessed fall on blood thinner EXAM: CT HEAD WITHOUT CONTRAST TECHNIQUE: Contiguous axial images were obtained from the base of the skull through the vertex without intravenous contrast. RADIATION DOSE REDUCTION: This exam was performed according to the departmental dose-optimization program which includes automated exposure control, adjustment  of the mA and/or kV according to patient size and/or use of iterative reconstruction technique. COMPARISON:  Two days ago FINDINGS: Brain: No evidence of acute infarction, hemorrhage, hydrocephalus, extra-axial collection or mass lesion/mass effect. Right occipital infarct remotely. Confluent chronic small vessel ischemia in the deep cerebral white matter. Vascular: No hyperdense vessel or unexpected calcification. Skull: Normal. Negative for fracture or focal lesion. Sinuses/Orbits: No acute finding. IMPRESSION: No evidence  of intracranial injury. Electronically Signed   By: Tiburcio Pea M.D.   On: 04/04/2023 08:51   DG Hip Unilat W or Wo Pelvis 2-3 Views Left Result Date: 04/02/2023 CLINICAL DATA:  Fall.  Left hip pain. EXAM: DG HIP (WITH OR WITHOUT PELVIS) 2-3V LEFT COMPARISON:  02/25/2023. FINDINGS: Pelvis is intact with normal and symmetric sacroiliac joints. No acute fracture or dislocation. No aggressive osseous lesion. Visualized sacral arcuate lines are unremarkable. Unremarkable symphysis pubis. Redemonstration of left total hip arthroplasty The hardware is intact. No periprosthetic fracture or lucency. No interval change in alignment, when compared to the available recent prior examination. There are mild degenerative changes of the right hip joint without significant joint space narrowing. Osteophytosis of the superior acetabulum. No radiopaque foreign bodies. IMPRESSION: *No acute osseous abnormality of the pelvis or left hip joint. Electronically Signed   By: Jules Schick M.D.   On: 04/02/2023 10:09   CT Head Wo Contrast Result Date: 04/02/2023 CLINICAL DATA:  Dementia, UTI, and fall.  Head and neck trauma. EXAM: CT HEAD WITHOUT CONTRAST CT CERVICAL SPINE WITHOUT CONTRAST TECHNIQUE: Multidetector CT imaging of the head and cervical spine was performed following the standard protocol without intravenous contrast. Multiplanar CT image reconstructions of the cervical spine were also generated.  RADIATION DOSE REDUCTION: This exam was performed according to the departmental dose-optimization program which includes automated exposure control, adjustment of the mA and/or kV according to patient size and/or use of iterative reconstruction technique. COMPARISON:  01/25/2023 FINDINGS: CT HEAD FINDINGS Brain: No evidence of acute infarction, hemorrhage, hydrocephalus, extra-axial collection or mass lesion/mass effect. Chronic small vessel ischemia which is confluent in the deep white matter. Chronic infarct centered at the right temporal occipital cortex. Generalized cerebral volume loss. Vascular: No hyperdense vessel or unexpected calcification. Skull: No acute fracture Sinuses/Orbits: No evidence of injury CT CERVICAL SPINE FINDINGS Alignment: No traumatic malalignment Skull base and vertebrae: No acute fracture. C6-7 ACDF with solid arthrodesis. Facet ankylosis at C3-4 and C4-5. Soft tissues and spinal canal: No prevertebral fluid or swelling. No visible canal hematoma. Disc levels:  Generalized degenerative endplate and facet spurring. Upper chest: No visible injury IMPRESSION: No evidence of acute intracranial or cervical spine injury. Electronically Signed   By: Tiburcio Pea M.D.   On: 04/02/2023 08:31   CT Cervical Spine Wo Contrast Result Date: 04/02/2023 CLINICAL DATA:  Dementia, UTI, and fall.  Head and neck trauma. EXAM: CT HEAD WITHOUT CONTRAST CT CERVICAL SPINE WITHOUT CONTRAST TECHNIQUE: Multidetector CT imaging of the head and cervical spine was performed following the standard protocol without intravenous contrast. Multiplanar CT image reconstructions of the cervical spine were also generated. RADIATION DOSE REDUCTION: This exam was performed according to the departmental dose-optimization program which includes automated exposure control, adjustment of the mA and/or kV according to patient size and/or use of iterative reconstruction technique. COMPARISON:  01/25/2023 FINDINGS: CT HEAD  FINDINGS Brain: No evidence of acute infarction, hemorrhage, hydrocephalus, extra-axial collection or mass lesion/mass effect. Chronic small vessel ischemia which is confluent in the deep white matter. Chronic infarct centered at the right temporal occipital cortex. Generalized cerebral volume loss. Vascular: No hyperdense vessel or unexpected calcification. Skull: No acute fracture Sinuses/Orbits: No evidence of injury CT CERVICAL SPINE FINDINGS Alignment: No traumatic malalignment Skull base and vertebrae: No acute fracture. C6-7 ACDF with solid arthrodesis. Facet ankylosis at C3-4 and C4-5. Soft tissues and spinal canal: No prevertebral fluid or swelling. No visible canal hematoma. Disc levels:  Generalized degenerative endplate and  facet spurring. Upper chest: No visible injury IMPRESSION: No evidence of acute intracranial or cervical spine injury. Electronically Signed   By: Tiburcio Pea M.D.   On: 04/02/2023 08:31   DG Chest Portable 1 View Result Date: 04/02/2023 CLINICAL DATA:  Dyspnea EXAM: PORTABLE CHEST 1 VIEW COMPARISON:  03/25/2023 FINDINGS: Lung volumes are small. Stable elevation right hemidiaphragm. No superimposed confluent pulmonary infiltrate. No pneumothorax or pleural effusion. Cardiac size within normal limits. Pulmonary vascularity is normal. No acute bone IMPRESSION: 1. Pulmonary hypoinflation. Electronically Signed   By: Helyn Numbers M.D.   On: 04/02/2023 01:21   DG Chest 2 View Result Date: 03/25/2023 CLINICAL DATA:  Altered mental status.  Infection workup EXAM: CHEST - 2 VIEW COMPARISON:  X-ray 03/12/2023 FINDINGS: Underinflation with elevated right hemidiaphragm. No consolidation, pneumothorax or effusion. No edema. Stable cardiopericardial silhouette. Interstitial changes are again seen and could be chronic. Fixation hardware seen along the lower cervical spine at the edge of the imaging field. Osteopenia with degenerative changes along the spine. Overlapping cardiac leads.  IMPRESSION: Elevated right hemidiaphragm. Underinflation. Likely chronic interstitial changes. Electronically Signed   By: Karen Kays M.D.   On: 03/25/2023 16:06   CT Head Wo Contrast Result Date: 03/25/2023 CLINICAL DATA:  Worsening altered mental status for 2 days. Slurred speech. EXAM: CT HEAD WITHOUT CONTRAST TECHNIQUE: Contiguous axial images were obtained from the base of the skull through the vertex without intravenous contrast. RADIATION DOSE REDUCTION: This exam was performed according to the departmental dose-optimization program which includes automated exposure control, adjustment of the mA and/or kV according to patient size and/or use of iterative reconstruction technique. COMPARISON:  CT head 02/14/2023. FINDINGS: Brain: No acute intracranial hemorrhage. No CT evidence of acute infarct. Nonspecific hypoattenuation in the periventricular and subcortical white matter favored to reflect chronic microvascular ischemic changes. Remote lacunar infarct in the left basal ganglia. Similar appearance of encephalomalacia in the right parieto-occipital lobes no edema, mass effect, or midline shift. The basilar cisterns are patent. Ventricles: Prominence of the ventricles suggesting underlying parenchymal volume loss. Vascular: Atherosclerotic calcifications of the carotid siphons. No hyperdense vessel. Skull: No acute or aggressive finding. Orbits: Orbits are symmetric. Sinuses: Mucosal thickening in the bilateral maxillary and ethmoid sinuses as well as the left sphenoid sinus. Other: Mastoid air cells are clear. IMPRESSION: 1. No acute intracranial abnormality. 2. Chronic microvascular ischemic changes and parenchymal volume loss. 3. Remote lacunar infarct in the left basal ganglia. 4. Similar appearance of encephalomalacia in the right parieto-occipital lobes. 5. Paranasal sinus disease is increased from prior. Electronically Signed   By: Emily Filbert M.D.   On: 03/25/2023 14:50   DG Chest 2  View Result Date: 03/12/2023 CLINICAL DATA:  Generalized weakness with cough EXAM: CHEST - 2 VIEW COMPARISON:  Chest radiograph dated 02/10/2023 FINDINGS: Low lung volumes with bronchovascular crowding. No focal consolidations. No pleural effusion or pneumothorax. The heart size and mediastinal contours are within normal limits. Cervical spinal fixation hardware appears intact. IMPRESSION: Low lung volumes with bronchovascular crowding. No focal consolidations. Electronically Signed   By: Agustin Cree M.D.   On: 03/12/2023 15:19    Microbiology: Recent Results (from the past 240 hours)  Resp panel by RT-PCR (RSV, Flu A&B, Covid) Anterior Nasal Swab     Status: None   Collection Time: 04/01/23 11:57 PM   Specimen: Anterior Nasal Swab  Result Value Ref Range Status   SARS Coronavirus 2 by RT PCR NEGATIVE NEGATIVE Final    Comment: (NOTE) SARS-CoV-2  target nucleic acids are NOT DETECTED.  The SARS-CoV-2 RNA is generally detectable in upper respiratory specimens during the acute phase of infection. The lowest concentration of SARS-CoV-2 viral copies this assay can detect is 138 copies/mL. A negative result does not preclude SARS-Cov-2 infection and should not be used as the sole basis for treatment or other patient management decisions. A negative result may occur with  improper specimen collection/handling, submission of specimen other than nasopharyngeal swab, presence of viral mutation(s) within the areas targeted by this assay, and inadequate number of viral copies(<138 copies/mL). A negative result must be combined with clinical observations, patient history, and epidemiological information. The expected result is Negative.  Fact Sheet for Patients:  BloggerCourse.com  Fact Sheet for Healthcare Providers:  SeriousBroker.it  This test is no t yet approved or cleared by the [place] FDA and  has been authorized for detection and/or  diagnosis of SARS-CoV-2 by FDA under an Emergency Use Authorization (EUA). This EUA will remain  in effect (meaning this test can be used) for the duration of the COVID-19 declaration under Section 564(b)(1) of the Act, 21 U.S.C.section 360bbb-3(b)(1), unless the authorization is terminated  or revoked sooner.       Influenza A by PCR NEGATIVE NEGATIVE Final   Influenza B by PCR NEGATIVE NEGATIVE Final    Comment: (NOTE) The Xpert Xpress SARS-CoV-2/FLU/RSV plus assay is intended as an aid in the diagnosis of influenza from Nasopharyngeal swab specimens and should not be used as a sole basis for treatment. Nasal washings and aspirates are unacceptable for Xpert Xpress SARS-CoV-2/FLU/RSV testing.  Fact Sheet for Patients: BloggerCourse.com  Fact Sheet for Healthcare Providers: SeriousBroker.it  This test is not yet approved or cleared by the [place] FDA and has been authorized for detection and/or diagnosis of SARS-CoV-2 by FDA under an Emergency Use Authorization (EUA). This EUA will remain in effect (meaning this test can be used) for the duration of the COVID-19 declaration under Section 564(b)(1) of the Act, 21 U.S.C. section 360bbb-3(b)(1), unless the authorization is terminated or revoked.     Resp Syncytial Virus by PCR NEGATIVE NEGATIVE Final    Comment: (NOTE) Fact Sheet for Patients: BloggerCourse.com  Fact Sheet for Healthcare Providers: SeriousBroker.it  This test is not yet approved or cleared by the [place] FDA and has been authorized for detection and/or diagnosis of SARS-CoV-2 by FDA under an Emergency Use Authorization (EUA). This EUA will remain in effect (meaning this test can be used) for the duration of the COVID-19 declaration under Section 564(b)(1) of the Act, 21 U.S.C. section 360bbb-3(b)(1), unless the authorization is terminated  or revoked.  Performed at [facility], [address] [place], [place] [address]   Urine Culture     Status: None   Collection Time: 04/02/23  3:25 AM   Specimen: Urine, Clean Catch  Result Value Ref Range Status   Specimen Description   Final    URINE, CLEAN CATCH Performed at [facility], [address] [place], [place] [address]    Special Requests   Final    Normal Performed at [facility], [name], [place], [place] [address]    Culture   Final    NO GROWTH Performed at [facility], [address], [place], [place] [address]    Report Status 04/03/2023 FINAL  Final  Urine Culture     Status: Abnormal   Collection Time: 04/07/23  5:23 PM   Specimen: Urine, Clean Catch  Result Value Ref Range Status   Specimen Description URINE, CLEAN CATCH  Final   Special Requests   Final    NONE Performed at [facility], [address] [place], [place] [address]    Culture MULTIPLE SPECIES PRESENT, SUGGEST RECOLLECTION (A)  Final   Report Status 04/09/2023 FINAL  Final    Time spent: 25 minutes  Signed: Lorin Glass, MD 03/23/2023

## 2023-04-20 NOTE — Progress Notes (Signed)
 Was called to room by pt. Daughter, no respirations, BP or apical pulse noted, Dr. Pola Corn in, time of death [phone]  Margarita Grizzle

## 2023-04-20 NOTE — Progress Notes (Signed)
 Pt. Transferred to morgue  [employer]

## 2023-04-20 DEATH — deceased

## 2023-04-22 ENCOUNTER — Other Ambulatory Visit: Admitting: *Deleted

## 2023-04-22 NOTE — Telephone Encounter (Signed)
Will send a sympathy card.

## 2023-05-26 ENCOUNTER — Other Ambulatory Visit (HOSPITAL_BASED_OUTPATIENT_CLINIC_OR_DEPARTMENT_OTHER): Payer: Self-pay

## 2023-06-01 ENCOUNTER — Ambulatory Visit: Payer: Medicare HMO | Admitting: Family

## 2023-06-16 ENCOUNTER — Ambulatory Visit: Admitting: Neurology

## 2023-06-29 ENCOUNTER — Ambulatory Visit: Payer: Medicare HMO | Admitting: Neurology
